# Patient Record
Sex: Female | Born: 1975
Health system: Southern US, Community
[De-identification: ages and names within clinical notes are randomized; demographics above are authoritative.]

## PROBLEM LIST (undated history)

## (undated) ENCOUNTER — Emergency Department (HOSPITAL_COMMUNITY): Admission: EM | Payer: Self-pay | Source: Home / Self Care

## (undated) DIAGNOSIS — E785 Hyperlipidemia, unspecified: Secondary | ICD-10-CM

## (undated) DIAGNOSIS — E11319 Type 2 diabetes mellitus with unspecified diabetic retinopathy without macular edema: Secondary | ICD-10-CM

## (undated) DIAGNOSIS — I739 Peripheral vascular disease, unspecified: Secondary | ICD-10-CM

## (undated) DIAGNOSIS — E109 Type 1 diabetes mellitus without complications: Secondary | ICD-10-CM

## (undated) DIAGNOSIS — D649 Anemia, unspecified: Secondary | ICD-10-CM

## (undated) DIAGNOSIS — Z992 Dependence on renal dialysis: Secondary | ICD-10-CM

## (undated) DIAGNOSIS — M1990 Unspecified osteoarthritis, unspecified site: Secondary | ICD-10-CM

## (undated) DIAGNOSIS — Z9289 Personal history of other medical treatment: Secondary | ICD-10-CM

## (undated) DIAGNOSIS — K219 Gastro-esophageal reflux disease without esophagitis: Secondary | ICD-10-CM

## (undated) DIAGNOSIS — J189 Pneumonia, unspecified organism: Secondary | ICD-10-CM

## (undated) DIAGNOSIS — I509 Heart failure, unspecified: Secondary | ICD-10-CM

## (undated) DIAGNOSIS — E049 Nontoxic goiter, unspecified: Secondary | ICD-10-CM

## (undated) DIAGNOSIS — I639 Cerebral infarction, unspecified: Secondary | ICD-10-CM

## (undated) DIAGNOSIS — I10 Essential (primary) hypertension: Secondary | ICD-10-CM

## (undated) DIAGNOSIS — N186 End stage renal disease: Secondary | ICD-10-CM

## (undated) HISTORY — DX: Cerebral infarction, unspecified: I63.9

## (undated) HISTORY — PX: EYE SURGERY: SHX253

## (undated) HISTORY — PX: ARTERIOVENOUS GRAFT PLACEMENT: SUR1029

## (undated) HISTORY — DX: Essential (primary) hypertension: I10

## (undated) HISTORY — DX: Anemia, unspecified: D64.9

## (undated) HISTORY — DX: Hyperlipidemia, unspecified: E78.5

---

## 1998-08-10 ENCOUNTER — Emergency Department (HOSPITAL_COMMUNITY): Admission: EM | Admit: 1998-08-10 | Discharge: 1998-08-10 | Payer: Self-pay | Admitting: Emergency Medicine

## 1999-08-22 ENCOUNTER — Encounter: Admission: RE | Admit: 1999-08-22 | Discharge: 1999-11-20 | Payer: Self-pay | Admitting: Internal Medicine

## 1999-10-12 ENCOUNTER — Emergency Department (HOSPITAL_COMMUNITY): Admission: EM | Admit: 1999-10-12 | Discharge: 1999-10-12 | Payer: Self-pay | Admitting: Emergency Medicine

## 2003-10-30 ENCOUNTER — Encounter (INDEPENDENT_AMBULATORY_CARE_PROVIDER_SITE_OTHER): Payer: Self-pay | Admitting: *Deleted

## 2003-10-30 ENCOUNTER — Ambulatory Visit (HOSPITAL_COMMUNITY): Admission: RE | Admit: 2003-10-30 | Discharge: 2003-10-30 | Payer: Self-pay | Admitting: Internal Medicine

## 2003-10-31 ENCOUNTER — Encounter: Admission: RE | Admit: 2003-10-31 | Discharge: 2003-10-31 | Payer: Self-pay | Admitting: Internal Medicine

## 2005-01-22 ENCOUNTER — Observation Stay (HOSPITAL_COMMUNITY): Admission: EM | Admit: 2005-01-22 | Discharge: 2005-01-23 | Payer: Self-pay | Admitting: Emergency Medicine

## 2005-03-15 ENCOUNTER — Emergency Department (HOSPITAL_COMMUNITY): Admission: EM | Admit: 2005-03-15 | Discharge: 2005-03-15 | Payer: Self-pay | Admitting: Emergency Medicine

## 2005-04-04 ENCOUNTER — Emergency Department (HOSPITAL_COMMUNITY): Admission: EM | Admit: 2005-04-04 | Discharge: 2005-04-04 | Payer: Self-pay | Admitting: Emergency Medicine

## 2005-05-14 ENCOUNTER — Encounter: Admission: RE | Admit: 2005-05-14 | Discharge: 2005-05-14 | Payer: Self-pay | Admitting: Internal Medicine

## 2005-06-18 ENCOUNTER — Emergency Department (HOSPITAL_COMMUNITY): Admission: EM | Admit: 2005-06-18 | Discharge: 2005-06-18 | Payer: Self-pay | Admitting: Emergency Medicine

## 2005-07-08 ENCOUNTER — Inpatient Hospital Stay (HOSPITAL_COMMUNITY): Admission: AD | Admit: 2005-07-08 | Discharge: 2005-07-08 | Payer: Self-pay | Admitting: Obstetrics & Gynecology

## 2005-07-19 ENCOUNTER — Inpatient Hospital Stay (HOSPITAL_COMMUNITY): Admission: AD | Admit: 2005-07-19 | Discharge: 2005-07-19 | Payer: Self-pay | Admitting: Obstetrics and Gynecology

## 2005-07-21 ENCOUNTER — Observation Stay (HOSPITAL_COMMUNITY): Admission: AD | Admit: 2005-07-21 | Discharge: 2005-07-22 | Payer: Self-pay | Admitting: Obstetrics and Gynecology

## 2005-07-31 ENCOUNTER — Inpatient Hospital Stay (HOSPITAL_COMMUNITY): Admission: AD | Admit: 2005-07-31 | Discharge: 2005-07-31 | Payer: Self-pay | Admitting: *Deleted

## 2005-08-01 ENCOUNTER — Inpatient Hospital Stay (HOSPITAL_COMMUNITY): Admission: AD | Admit: 2005-08-01 | Discharge: 2005-08-01 | Payer: Self-pay | Admitting: Obstetrics and Gynecology

## 2005-09-12 ENCOUNTER — Emergency Department (HOSPITAL_COMMUNITY): Admission: EM | Admit: 2005-09-12 | Discharge: 2005-09-12 | Payer: Self-pay | Admitting: Emergency Medicine

## 2005-09-30 ENCOUNTER — Encounter (HOSPITAL_COMMUNITY): Admission: RE | Admit: 2005-09-30 | Discharge: 2005-11-12 | Payer: Self-pay | Admitting: Nephrology

## 2005-10-11 ENCOUNTER — Inpatient Hospital Stay (HOSPITAL_COMMUNITY): Admission: EM | Admit: 2005-10-11 | Discharge: 2005-10-13 | Payer: Self-pay | Admitting: Emergency Medicine

## 2005-10-16 ENCOUNTER — Ambulatory Visit: Payer: Self-pay | Admitting: Gastroenterology

## 2005-10-27 ENCOUNTER — Encounter: Admission: RE | Admit: 2005-10-27 | Discharge: 2005-10-27 | Payer: Self-pay | Admitting: Nephrology

## 2005-11-14 ENCOUNTER — Inpatient Hospital Stay (HOSPITAL_COMMUNITY): Admission: EM | Admit: 2005-11-14 | Discharge: 2005-11-17 | Payer: Self-pay | Admitting: Emergency Medicine

## 2005-12-21 ENCOUNTER — Inpatient Hospital Stay (HOSPITAL_COMMUNITY): Admission: EM | Admit: 2005-12-21 | Discharge: 2005-12-31 | Payer: Self-pay | Admitting: Internal Medicine

## 2005-12-23 ENCOUNTER — Encounter (INDEPENDENT_AMBULATORY_CARE_PROVIDER_SITE_OTHER): Payer: Self-pay | Admitting: Specialist

## 2006-02-20 ENCOUNTER — Ambulatory Visit (HOSPITAL_COMMUNITY): Admission: RE | Admit: 2006-02-20 | Discharge: 2006-02-20 | Payer: Self-pay | Admitting: Urology

## 2006-04-06 ENCOUNTER — Emergency Department (HOSPITAL_COMMUNITY): Admission: EM | Admit: 2006-04-06 | Discharge: 2006-04-06 | Payer: Self-pay | Admitting: Emergency Medicine

## 2006-04-07 ENCOUNTER — Emergency Department (HOSPITAL_COMMUNITY): Admission: EM | Admit: 2006-04-07 | Discharge: 2006-04-07 | Payer: Self-pay | Admitting: Emergency Medicine

## 2006-05-08 ENCOUNTER — Encounter (HOSPITAL_COMMUNITY): Admission: RE | Admit: 2006-05-08 | Discharge: 2006-05-08 | Payer: Self-pay | Admitting: Nephrology

## 2006-08-17 ENCOUNTER — Emergency Department (HOSPITAL_COMMUNITY): Admission: EM | Admit: 2006-08-17 | Discharge: 2006-08-17 | Payer: Self-pay | Admitting: Emergency Medicine

## 2006-09-06 ENCOUNTER — Inpatient Hospital Stay (HOSPITAL_COMMUNITY): Admission: EM | Admit: 2006-09-06 | Discharge: 2006-09-07 | Payer: Self-pay | Admitting: Emergency Medicine

## 2006-09-09 ENCOUNTER — Ambulatory Visit (HOSPITAL_COMMUNITY): Admission: RE | Admit: 2006-09-09 | Discharge: 2006-09-09 | Payer: Self-pay | Admitting: Vascular Surgery

## 2006-09-09 ENCOUNTER — Ambulatory Visit: Payer: Self-pay | Admitting: Vascular Surgery

## 2006-10-05 ENCOUNTER — Ambulatory Visit: Payer: Self-pay | Admitting: Vascular Surgery

## 2006-11-16 ENCOUNTER — Encounter (HOSPITAL_COMMUNITY): Admission: RE | Admit: 2006-11-16 | Discharge: 2007-02-14 | Payer: Self-pay | Admitting: Nephrology

## 2007-02-15 ENCOUNTER — Encounter (HOSPITAL_COMMUNITY): Admission: RE | Admit: 2007-02-15 | Discharge: 2007-04-26 | Payer: Self-pay | Admitting: Nephrology

## 2007-05-31 ENCOUNTER — Encounter (HOSPITAL_COMMUNITY): Admission: RE | Admit: 2007-05-31 | Discharge: 2007-07-23 | Payer: Self-pay | Admitting: Nephrology

## 2007-08-31 ENCOUNTER — Encounter (HOSPITAL_COMMUNITY): Admission: RE | Admit: 2007-08-31 | Discharge: 2007-11-29 | Payer: Self-pay | Admitting: Nephrology

## 2008-02-15 ENCOUNTER — Encounter (HOSPITAL_COMMUNITY): Admission: RE | Admit: 2008-02-15 | Discharge: 2008-05-15 | Payer: Self-pay | Admitting: Nephrology

## 2008-06-02 ENCOUNTER — Inpatient Hospital Stay (HOSPITAL_COMMUNITY): Admission: EM | Admit: 2008-06-02 | Discharge: 2008-06-04 | Payer: Self-pay | Admitting: Emergency Medicine

## 2008-10-06 ENCOUNTER — Other Ambulatory Visit: Payer: Self-pay | Admitting: Emergency Medicine

## 2008-10-07 ENCOUNTER — Inpatient Hospital Stay (HOSPITAL_COMMUNITY): Admission: RE | Admit: 2008-10-07 | Discharge: 2008-10-09 | Payer: Self-pay | Admitting: Internal Medicine

## 2008-10-07 ENCOUNTER — Other Ambulatory Visit: Payer: Self-pay | Admitting: Emergency Medicine

## 2008-10-16 ENCOUNTER — Encounter (HOSPITAL_COMMUNITY): Admission: RE | Admit: 2008-10-16 | Discharge: 2009-01-14 | Payer: Self-pay | Admitting: Nephrology

## 2008-11-08 ENCOUNTER — Ambulatory Visit: Payer: Self-pay | Admitting: Internal Medicine

## 2009-01-23 ENCOUNTER — Encounter (HOSPITAL_COMMUNITY): Admission: RE | Admit: 2009-01-23 | Discharge: 2009-04-23 | Payer: Self-pay | Admitting: Nephrology

## 2009-05-14 ENCOUNTER — Encounter (HOSPITAL_COMMUNITY): Admission: RE | Admit: 2009-05-14 | Discharge: 2009-08-12 | Payer: Self-pay | Admitting: Nephrology

## 2009-05-14 ENCOUNTER — Ambulatory Visit (HOSPITAL_COMMUNITY): Admission: RE | Admit: 2009-05-14 | Discharge: 2009-05-14 | Payer: Self-pay | Admitting: Internal Medicine

## 2009-08-20 ENCOUNTER — Encounter (HOSPITAL_COMMUNITY): Admission: RE | Admit: 2009-08-20 | Discharge: 2009-11-18 | Payer: Self-pay | Admitting: Nephrology

## 2009-11-19 ENCOUNTER — Encounter (HOSPITAL_COMMUNITY): Admission: RE | Admit: 2009-11-19 | Discharge: 2010-02-17 | Payer: Self-pay | Admitting: Nephrology

## 2010-03-29 ENCOUNTER — Encounter (HOSPITAL_COMMUNITY)
Admission: RE | Admit: 2010-03-29 | Discharge: 2010-06-27 | Payer: Self-pay | Source: Home / Self Care | Attending: Nephrology | Admitting: Nephrology

## 2010-07-23 ENCOUNTER — Encounter (HOSPITAL_COMMUNITY)
Admission: RE | Admit: 2010-07-23 | Discharge: 2010-08-20 | Payer: Self-pay | Source: Home / Self Care | Attending: Nephrology | Admitting: Nephrology

## 2010-08-11 ENCOUNTER — Encounter: Payer: Self-pay | Admitting: Internal Medicine

## 2010-08-11 ENCOUNTER — Encounter: Payer: Self-pay | Admitting: Urology

## 2010-09-17 ENCOUNTER — Encounter (HOSPITAL_COMMUNITY): Payer: Medicaid Other | Attending: Nephrology

## 2010-09-24 ENCOUNTER — Encounter (HOSPITAL_COMMUNITY): Payer: Medicaid Other | Attending: Nephrology

## 2010-09-24 DIAGNOSIS — E119 Type 2 diabetes mellitus without complications: Secondary | ICD-10-CM | POA: Insufficient documentation

## 2010-09-24 DIAGNOSIS — N2581 Secondary hyperparathyroidism of renal origin: Secondary | ICD-10-CM | POA: Insufficient documentation

## 2010-09-24 DIAGNOSIS — N184 Chronic kidney disease, stage 4 (severe): Secondary | ICD-10-CM | POA: Insufficient documentation

## 2010-09-24 DIAGNOSIS — D638 Anemia in other chronic diseases classified elsewhere: Secondary | ICD-10-CM | POA: Insufficient documentation

## 2010-10-01 LAB — RENAL FUNCTION PANEL
Albumin: 3.9 g/dL (ref 3.5–5.2)
BUN: 40 mg/dL — ABNORMAL HIGH (ref 6–23)
CO2: 23 mEq/L (ref 19–32)
Calcium: 9.6 mg/dL (ref 8.4–10.5)
Chloride: 103 mEq/L (ref 96–112)
Creatinine, Ser: 5.22 mg/dL — ABNORMAL HIGH (ref 0.4–1.2)
GFR calc Af Amer: 12 mL/min — ABNORMAL LOW (ref >60)
GFR calc non Af Amer: 10 mL/min — ABNORMAL LOW (ref >60)
Glucose, Bld: 121 mg/dL — ABNORMAL HIGH (ref 70–99)
Phosphorus: 3.8 mg/dL (ref 2.3–4.6)
Potassium: 4 mEq/L (ref 3.5–5.1)
Sodium: 140 mEq/L (ref 135–145)

## 2010-10-01 LAB — POCT HEMOGLOBIN-HEMACUE: Hemoglobin: 12.5 g/dL (ref 12.0–15.0)

## 2010-10-01 LAB — FERRITIN: Ferritin: 1290 ng/mL — ABNORMAL HIGH (ref 10–291)

## 2010-10-01 LAB — IRON AND TIBC
Iron: 98 ug/dL (ref 42–135)
Saturation Ratios: 39 % (ref 20–55)
TIBC: 253 ug/dL (ref 250–470)
UIBC: 155 ug/dL

## 2010-10-02 LAB — IRON AND TIBC
Iron: 58 ug/dL (ref 42–135)
Saturation Ratios: 23 % (ref 20–55)
TIBC: 255 ug/dL (ref 250–470)
UIBC: 197 ug/dL

## 2010-10-02 LAB — RENAL FUNCTION PANEL
Albumin: 3.8 g/dL (ref 3.5–5.2)
BUN: 37 mg/dL — ABNORMAL HIGH (ref 6–23)
CO2: 25 mEq/L (ref 19–32)
Calcium: 9.3 mg/dL (ref 8.4–10.5)
Chloride: 106 mEq/L (ref 96–112)
Creatinine, Ser: 4.96 mg/dL — ABNORMAL HIGH (ref 0.4–1.2)
GFR calc Af Amer: 12 mL/min — ABNORMAL LOW (ref >60)
GFR calc non Af Amer: 10 mL/min — ABNORMAL LOW (ref >60)
Glucose, Bld: 99 mg/dL (ref 70–99)
Phosphorus: 3.6 mg/dL (ref 2.3–4.6)
Potassium: 3.7 mEq/L (ref 3.5–5.1)
Sodium: 140 mEq/L (ref 135–145)

## 2010-10-02 LAB — FERRITIN: Ferritin: 296 ng/mL — ABNORMAL HIGH (ref 10–291)

## 2010-10-02 LAB — POCT HEMOGLOBIN-HEMACUE: Hemoglobin: 12.6 g/dL (ref 12.0–15.0)

## 2010-10-03 LAB — IRON AND TIBC
Iron: 56 ug/dL (ref 42–135)
Saturation Ratios: 24 % (ref 20–55)
TIBC: 233 ug/dL — ABNORMAL LOW (ref 250–470)
UIBC: 177 ug/dL

## 2010-10-03 LAB — RENAL FUNCTION PANEL
Albumin: 3.8 g/dL (ref 3.5–5.2)
Calcium: 8.6 mg/dL (ref 8.4–10.5)
Creatinine, Ser: 4.71 mg/dL — ABNORMAL HIGH (ref 0.4–1.2)
GFR calc Af Amer: 13 mL/min — ABNORMAL LOW (ref >60)
GFR calc non Af Amer: 11 mL/min — ABNORMAL LOW (ref >60)
Phosphorus: 3.2 mg/dL (ref 2.3–4.6)
Sodium: 129 mEq/L — ABNORMAL LOW (ref 135–145)

## 2010-10-03 LAB — FERRITIN: Ferritin: 422 ng/mL — ABNORMAL HIGH (ref 10–291)

## 2010-10-03 LAB — POCT HEMOGLOBIN-HEMACUE
Hemoglobin: 10.9 g/dL — ABNORMAL LOW (ref 12.0–15.0)
Hemoglobin: 7.5 g/dL — ABNORMAL LOW (ref 12.0–15.0)
Hemoglobin: 7.9 g/dL — ABNORMAL LOW (ref 12.0–15.0)

## 2010-10-06 LAB — CBC
HCT: 41.1 % (ref 36.0–46.0)
Hemoglobin: 14.1 g/dL (ref 12.0–15.0)
MCHC: 34.3 g/dL (ref 30.0–36.0)
RBC: 4.73 MIL/uL (ref 3.87–5.11)
RDW: 16.1 % — ABNORMAL HIGH (ref 11.5–15.5)

## 2010-10-06 LAB — POCT HEMOGLOBIN-HEMACUE: Hemoglobin: 11.3 g/dL — ABNORMAL LOW (ref 12.0–15.0)

## 2010-10-06 LAB — IRON AND TIBC: TIBC: 251 ug/dL (ref 250–470)

## 2010-10-06 LAB — FERRITIN: Ferritin: 356 ng/mL — ABNORMAL HIGH (ref 10–291)

## 2010-10-07 LAB — RENAL FUNCTION PANEL
Albumin: 4.8 g/dL (ref 3.5–5.2)
CO2: 22 mEq/L (ref 19–32)
Chloride: 106 mEq/L (ref 96–112)
Creatinine, Ser: 5.38 mg/dL — ABNORMAL HIGH (ref 0.4–1.2)
GFR calc Af Amer: 11 mL/min — ABNORMAL LOW (ref >60)
GFR calc non Af Amer: 9 mL/min — ABNORMAL LOW (ref >60)
Potassium: 5.1 mEq/L (ref 3.5–5.1)

## 2010-10-07 LAB — POCT HEMOGLOBIN-HEMACUE: Hemoglobin: 12.3 g/dL (ref 12.0–15.0)

## 2010-10-07 LAB — IRON AND TIBC
Iron: 79 ug/dL (ref 42–135)
Saturation Ratios: 26 % (ref 20–55)
TIBC: 302 ug/dL (ref 250–470)
UIBC: 223 ug/dL

## 2010-10-07 LAB — PTH, INTACT AND CALCIUM: Calcium, Total (PTH): 10.2 mg/dL (ref 8.4–10.5)

## 2010-10-08 LAB — POCT HEMOGLOBIN-HEMACUE: Hemoglobin: 9.7 g/dL — ABNORMAL LOW (ref 12.0–15.0)

## 2010-10-09 LAB — IRON AND TIBC
Iron: 73 ug/dL (ref 42–135)
Saturation Ratios: 34 % (ref 20–55)
TIBC: 254 ug/dL (ref 250–470)
TIBC: 238 ug/dL — ABNORMAL LOW (ref 250–470)
UIBC: 157 ug/dL

## 2010-10-09 LAB — POCT HEMOGLOBIN-HEMACUE: Hemoglobin: 10.8 g/dL — ABNORMAL LOW (ref 12.0–15.0)

## 2010-10-09 LAB — FERRITIN: Ferritin: 467 ng/mL — ABNORMAL HIGH (ref 10–291)

## 2010-10-13 LAB — POCT HEMOGLOBIN-HEMACUE: Hemoglobin: 9.9 g/dL — ABNORMAL LOW (ref 12.0–15.0)

## 2010-10-21 ENCOUNTER — Other Ambulatory Visit: Payer: Self-pay | Admitting: Nephrology

## 2010-10-21 ENCOUNTER — Ambulatory Visit
Admission: RE | Admit: 2010-10-21 | Discharge: 2010-10-21 | Disposition: A | Discharge status: Home / Self Care | Payer: Medicaid Other | Source: Ambulatory Visit | Attending: Nephrology | Admitting: Nephrology

## 2010-10-21 DIAGNOSIS — R05 Cough: Secondary | ICD-10-CM

## 2010-10-21 DIAGNOSIS — R059 Cough, unspecified: Secondary | ICD-10-CM

## 2010-10-21 LAB — IRON AND TIBC
Iron: <10 ug/dL — ABNORMAL LOW (ref 42–135)
UIBC: UNDETERMINED ug/dL

## 2010-10-21 LAB — FERRITIN: Ferritin: 297 ng/mL — ABNORMAL HIGH (ref 10–291)

## 2010-10-22 LAB — POCT HEMOGLOBIN-HEMACUE: Hemoglobin: 11.7 g/dL — ABNORMAL LOW (ref 12.0–15.0)

## 2010-10-23 ENCOUNTER — Encounter (HOSPITAL_COMMUNITY): Payer: Medicaid Other

## 2010-10-23 LAB — POCT HEMOGLOBIN-HEMACUE
Hemoglobin: 12.2 g/dL (ref 12.0–15.0)
Hemoglobin: 9.3 g/dL — ABNORMAL LOW (ref 12.0–15.0)

## 2010-10-24 LAB — IRON AND TIBC
Iron: 82 ug/dL (ref 42–135)
Saturation Ratios: 31 % (ref 20–55)
TIBC: 268 ug/dL (ref 250–470)
UIBC: 186 ug/dL

## 2010-10-26 LAB — IRON AND TIBC
Saturation Ratios: 30 % (ref 20–55)
TIBC: 229 ug/dL — ABNORMAL LOW (ref 250–470)
UIBC: 160 ug/dL

## 2010-10-26 LAB — RENAL FUNCTION PANEL
CO2: 19 mEq/L (ref 19–32)
Calcium: 9.1 mg/dL (ref 8.4–10.5)
Creatinine, Ser: 4.16 mg/dL — ABNORMAL HIGH (ref 0.4–1.2)
GFR calc Af Amer: 15 mL/min — ABNORMAL LOW (ref >60)
GFR calc non Af Amer: 13 mL/min — ABNORMAL LOW (ref >60)
Phosphorus: 4 mg/dL (ref 2.3–4.6)
Sodium: 138 mEq/L (ref 135–145)

## 2010-10-27 LAB — POCT HEMOGLOBIN-HEMACUE: Hemoglobin: 12.4 g/dL (ref 12.0–15.0)

## 2010-10-28 LAB — POCT HEMOGLOBIN-HEMACUE: Hemoglobin: 12.1 g/dL (ref 12.0–15.0)

## 2010-10-29 LAB — FERRITIN: Ferritin: 200 ng/mL (ref 10–291)

## 2010-10-29 LAB — RENAL FUNCTION PANEL
BUN: 34 mg/dL — ABNORMAL HIGH (ref 6–23)
CO2: 21 mEq/L (ref 19–32)
Calcium: 8.9 mg/dL (ref 8.4–10.5)
Chloride: 116 mEq/L — ABNORMAL HIGH (ref 96–112)
Creatinine, Ser: 3.93 mg/dL — ABNORMAL HIGH (ref 0.4–1.2)
GFR calc Af Amer: 16 mL/min — ABNORMAL LOW (ref >60)
GFR calc non Af Amer: 13 mL/min — ABNORMAL LOW (ref >60)

## 2010-10-29 LAB — IRON AND TIBC
Iron: 55 ug/dL (ref 42–135)
Saturation Ratios: 24 % (ref 20–55)
TIBC: 231 ug/dL — ABNORMAL LOW (ref 250–470)

## 2010-10-29 LAB — GLUCOSE, CAPILLARY: Glucose-Capillary: 139 mg/dL — ABNORMAL HIGH (ref 70–99)

## 2010-10-30 LAB — POCT HEMOGLOBIN-HEMACUE
Hemoglobin: 9.7 g/dL — ABNORMAL LOW (ref 12.0–15.0)
Hemoglobin: 10.3 g/dL — ABNORMAL LOW (ref 12.0–15.0)

## 2010-10-31 LAB — CBC
Hemoglobin: 9.9 g/dL — ABNORMAL LOW (ref 12.0–15.0)
MCHC: 35.3 g/dL (ref 30.0–36.0)
MCHC: 35.3 g/dL (ref 30.0–36.0)
Platelets: 213 10*3/uL (ref 150–400)
RBC: 3.2 MIL/uL — ABNORMAL LOW (ref 3.87–5.11)
RDW: 13.1 % (ref 11.5–15.5)

## 2010-10-31 LAB — BASIC METABOLIC PANEL
BUN: 47 mg/dL — ABNORMAL HIGH (ref 6–23)
CO2: 15 mEq/L — ABNORMAL LOW (ref 19–32)
CO2: 18 mEq/L — ABNORMAL LOW (ref 19–32)
CO2: 18 mEq/L — ABNORMAL LOW (ref 19–32)
Calcium: 8.3 mg/dL — ABNORMAL LOW (ref 8.4–10.5)
Calcium: 8.3 mg/dL — ABNORMAL LOW (ref 8.4–10.5)
Calcium: 8.4 mg/dL (ref 8.4–10.5)
Calcium: 8.5 mg/dL (ref 8.4–10.5)
Chloride: 110 mEq/L (ref 96–112)
Chloride: 117 mEq/L — ABNORMAL HIGH (ref 96–112)
Creatinine, Ser: 3.9 mg/dL — ABNORMAL HIGH (ref 0.4–1.2)
Creatinine, Ser: 3.92 mg/dL — ABNORMAL HIGH (ref 0.4–1.2)
GFR calc Af Amer: 16 mL/min — ABNORMAL LOW (ref >60)
GFR calc Af Amer: 16 mL/min — ABNORMAL LOW (ref >60)
GFR calc Af Amer: 20 mL/min — ABNORMAL LOW (ref >60)
GFR calc non Af Amer: 13 mL/min — ABNORMAL LOW (ref >60)
GFR calc non Af Amer: 13 mL/min — ABNORMAL LOW (ref >60)
Glucose, Bld: 182 mg/dL — ABNORMAL HIGH (ref 70–99)
Glucose, Bld: 432 mg/dL — ABNORMAL HIGH (ref 70–99)
Glucose, Bld: 70 mg/dL (ref 70–99)
Potassium: 4.3 mEq/L (ref 3.5–5.1)
Sodium: 137 mEq/L (ref 135–145)
Sodium: 138 mEq/L (ref 135–145)
Sodium: 141 mEq/L (ref 135–145)

## 2010-10-31 LAB — URINALYSIS, ROUTINE W REFLEX MICROSCOPIC
Bilirubin Urine: NEGATIVE
Glucose, UA: >1000 mg/dL — AB
Ketones, ur: 40 mg/dL — AB
Leukocytes, UA: NEGATIVE
Nitrite: NEGATIVE
Protein, ur: 100 mg/dL — AB
Specific Gravity, Urine: 1.019 (ref 1.005–1.030)
Urobilinogen, UA: 0.2 mg/dL (ref 0.0–1.0)
pH: 5.5 (ref 5.0–8.0)

## 2010-10-31 LAB — BLOOD GAS, VENOUS
Acid-base deficit: 16.8 mmol/L — ABNORMAL HIGH (ref 0.0–2.0)
Bicarbonate: 10.4 mEq/L — ABNORMAL LOW (ref 20.0–24.0)
Patient temperature: 98.6
TCO2: 9.3 mmol/L (ref 0–100)

## 2010-10-31 LAB — GLUCOSE, CAPILLARY
Glucose-Capillary: 113 mg/dL — ABNORMAL HIGH (ref 70–99)
Glucose-Capillary: 119 mg/dL — ABNORMAL HIGH (ref 70–99)
Glucose-Capillary: 144 mg/dL — ABNORMAL HIGH (ref 70–99)
Glucose-Capillary: 144 mg/dL — ABNORMAL HIGH (ref 70–99)
Glucose-Capillary: 167 mg/dL — ABNORMAL HIGH (ref 70–99)
Glucose-Capillary: 270 mg/dL — ABNORMAL HIGH (ref 70–99)
Glucose-Capillary: 305 mg/dL — ABNORMAL HIGH (ref 70–99)
Glucose-Capillary: 413 mg/dL — ABNORMAL HIGH (ref 70–99)

## 2010-10-31 LAB — MAGNESIUM: Magnesium: 2.2 mg/dL (ref 1.5–2.5)

## 2010-10-31 LAB — POCT I-STAT, CHEM 8
Calcium, Ion: 1.14 mmol/L (ref 1.12–1.32)
Creatinine, Ser: 5.5 mg/dL — ABNORMAL HIGH (ref 0.4–1.2)
Glucose, Bld: >700 mg/dL (ref 70–99)
HCT: 33 % — ABNORMAL LOW (ref 36.0–46.0)
Hemoglobin: 11.2 g/dL — ABNORMAL LOW (ref 12.0–15.0)

## 2010-10-31 LAB — POCT HEMOGLOBIN-HEMACUE: Hemoglobin: 9.8 g/dL — ABNORMAL LOW (ref 12.0–15.0)

## 2010-10-31 LAB — LIPID PANEL
HDL: 40 mg/dL (ref >39)
LDL Cholesterol: 66 mg/dL (ref 0–99)
Total CHOL/HDL Ratio: 3.5 RATIO
Triglycerides: 170 mg/dL — ABNORMAL HIGH (ref <150)
VLDL: 34 mg/dL (ref 0–40)

## 2010-10-31 LAB — URINE MICROSCOPIC-ADD ON

## 2010-10-31 LAB — KETONES, QUALITATIVE

## 2010-12-03 NOTE — Discharge Summary (Signed)
[name], [name] NO.:  [id number]   MEDICAL RECORD NO.:  [id number]          PATIENT TYPE:  INP   LOCATION:  [id number]                         FACILITY:  [facility]   PHYSICIAN:  Domingo Mend, M.D. DATE OF BIRTH:  08/07/1975   DATE OF ADMISSION:  10/07/2008  DATE OF DISCHARGE:  10/09/2008                               DISCHARGE SUMMARY   DISCHARGE DIAGNOSES:  1. Diabetic ketoacidosis, resolved.  2. Type 1 diabetes mellitus.  3. Acute on chronic renal insufficiency with a baseline creatinine of      around 3.5.  4. Hypertension.  5. Hyperlipidemia.   DISCHARGE MEDICATIONS:  1. Norvasc 10 mg daily.  2. Zocor 20 mg daily.  3. Insulin 70/30 15 units in the morning and 10 units in the evening.   DISPOSITION AND FOLLOWUP:  [name] is discharged home in stable  condition.  She has been started on a regimen of 70/30 which would be  more financially adequate for her.  She was unable to afford the Lantus  in the past.  She has an appointment to see her nephrologist, Dr.  Marval Regal on Thursday, October 12, 2008, at 9 a.m.  She is urged to keep  that appointment as she may be nearing dialysis.  I have also asked case  management to assist [place] with providing her with primary care physicians  in the area who are accepting new patients.   CONSULTATION DURING THIS HOSPITALIZATION:  None.   IMAGES AND PROCEDURES:  None.   HISTORY AND PHYSICAL EXAM:  For full details please, refer to dictation  by Dr. Arnoldo Morale on October 07, 2008, but in brief [name] is a very  pleasant 35 year old African American woman with a history of type 2  diabetes mellitus who complains of being sick for the past 4 days' of  nausea, vomiting, increased weakness, increased thirst, and increased  urination.  She states that she ran out of her diabetic medications  about 4 days ago.  She also states that she has not had money to pay for  her medications either.  In emergency department, she was  found to have  a blood sugar of greater than 700 and for that reason she was admitted  for further evaluation and management.   HOSPITAL COURSE:  1. Diabetic ketoacidosis.  Concern was elevated CBG and ketones and      blood in urine.  She was initially admitted to the ICU and placed      on the DKA protocol including IV insulin monitor by the      Glucommander with copious amounts of IV fluids and potassium.  Her      DKA has now resolved.  She has been transitioned over to 70/30      insulin 15 units in the morning and 10 at night.  Her bicarb upon      discharge is 46 and she has a gap of 6 which is now closed.  She      will need close followup in the outpatient setting to ensure she      does not return  to the hospital in diabetic ketoacidosis.  2. For her type 1 diabetes mellitus, see above.  3. Chronic renal insufficiency.  She has a baseline creatinine of      about 3.5.  Upon admission, her creatinine was 5.5 which was likely      secondary to dehydration given the massive dehydration with DKA.      Her creatinine has now decreased to her baseline of about 3.5 and      in fact that it was 3.35 upon discharge today.  She has an      appointment this week with Dr. Marval Regal and she is urged to keep      that appointment as scheduled.  4. For her hypertension, she has maintained excellent blood pressure      control while in the hospital.  5. For her hyperlipidemia, she has an excellent fasting lipid panel as      follows:  Total cholesterol 140, triglycerides 170, and HDL 40 with      an LDL of 66.  She has been maintained on statin.  6. No other chronic medical issues.   DISCHARGE VITAL SIGNS:  Blood pressure 122/78, heart rate 86,  respirations 18, and O2 sats 99% on room air with a temperature of 98.0.   DISCHARGE LABORATORY DATA:  Sodium 141, potassium 3.7, chloride 117,  bicarb 18, BUN 27, and creatinine 3.35 with a glucose of 105 that gives  her a gap of 6.  WBCs 5.1,  hemoglobin 9.9, and a platelet count of 187.   LABORATORY DATA AND IMAGES PENDING AT TIME OF THIS DICTATION:  None.      Domingo Mend, M.D.  Electronically Signed     EH/MEDQ  D:  10/09/2008  T:  10/09/2008  Job:  [id number]   cc:   Donato Heinz, M.D.

## 2010-12-03 NOTE — H&P (Signed)
[name], FAGUNDES NO.:  [id number]   MEDICAL RECORD NO.:  [id number]          PATIENT TYPE:  EMS   LOCATION:  ED                           [facility]ACILITY[facility]   PHYSICIAN:  Jana Hakim, M.D. DATE OF BIRTH:  1976-02-04   DATE OF ADMISSION:  10/06/2008  DATE OF DISCHARGE:                              HISTORY & PHYSICAL   PRIMARY CARE PHYSICIAN:  [facility].   RENAL SPECIALIST:  Dr. Marval Regal.   CHIEF COMPLAINT:  Weakness, nausea.   HISTORY OF PRESENT ILLNESS:  This is a 35 year old female who presents  to the emergency department with complaints of being sick for the past 4  days with nausea, no vomiting, but increased weakness, increased thirst,  increased drinking of fluids and increased urination.  She states that  she ran out of her diabetic medications 4 days ago and became ill at  that time.  She states that she has not had money to pay for her  medications and receive them.   The patient was evaluated in the emergency department and found to have  a blood sugar greater than 700.  She also had multiple electrolyte  abnormalities and was referred for admission and placed on the IV  insulin drip protocol.   PAST MEDICAL HISTORY:  1. Type 1 diabetes mellitus.  2. History of chronic renal failure and has been evaluated for      possible upcoming dialysis.   PAST SURGICAL HISTORY:  1. History of a C-section.  2. History of an AV fistula in the left upper extremity.   MEDICATIONS:  None at time.   ALLERGIES:  NO KNOWN DRUG ALLERGIES.   SOCIAL HISTORY:  The patient is a nonsmoker, nondrinker.   FAMILY HISTORY:  Unknown.   PHYSICAL EXAMINATION:  GENERAL:  This is a 35 year old well-nourished,  well-developed female in acute distress.  VITAL SIGNS:  Temperature 98.8, blood pressure 176/85, heart rate 111,  respirations 20.  O2 saturations 100% on room air.  HEENT:  Normocephalic, atraumatic.  Pupils equally round and reactive to  light.   Extraocular movements are intact.  Funduscopic benign.  Oropharynx is clear.  NECK:  Supple.  Full range of motion.  No thyromegaly, adenopathy or  jugulovenous distention.  CARDIOVASCULAR:  Tachycardiac rate and rhythm.  No murmurs, gallops or rubs.  LUNGS:  Clear to auscultation bilaterally.  ABDOMEN:  Positive bowel sounds.  Soft, nontender, nondistended.  EXTREMITIES:  Without cyanosis, clubbing or edema.  NEUROLOGIC:  Alert and oriented x3.  There are no focal deficits.   LABORATORY STUDIES:  A venous blood gas was performed with a pH of  7.199, pCO2 of 27.6, pO2 of 30.3 and bicarb 10.4.  O2 saturations 51.9%.  Sodium 122, potassium 6.3, chloride 102, BUN 70, creatinine 5.5, glucose  greater than 700 and CO2 of 10.  Blood acetone rated as being small.   ASSESSMENT:  A 35 year old female being admitted with;  1. Severe acute diabetic ketoacidosis.  2. Acute renal failure with chronic renal failure.  3. Hyponatremia.  4. Hyperkalemia in the setting of diabetic ketoacidosis and chronic  renal failure.   PLAN:  The patient will be admitted to an ICU unit.  She has been placed  on the IV insulin drip protocol and IV fluids have been ordered for  fluid resuscitation.  Her electrolytes will be monitored serially until  her anion gap closes.  The patient will be placed on non-caloric clears  while she is on the IV insulin drip.  She will transition to a sliding  scale protocol once she is off the insulin drip.  Antiemetics have been  ordered as needed and the patient will be placed on DVT and GI  prophylaxis.  The [facility] will be notified of the  patient's admission and will be asked for consultation.  The patient  will be transferred to the [facility] since she is in acute  renal failure with chronic renal failure and may need to be evaluated  for dialysis sooner.      Jana Hakim, M.D.  Electronically Signed     HJ/MEDQ  D:  10/07/2008   T:  10/07/2008  Job:  [id number]

## 2010-12-03 NOTE — H&P (Signed)
[name], [name] NO.:  [id number]   MEDICAL RECORD NO.:  [id number]          PATIENT TYPE:  EMS   LOCATION:  ED                           [facility]ACILITY:  [facility]   PHYSICIAN:  Lurene Shadow, MDDATE OF BIRTH:  05/30/1976   DATE OF ADMISSION:  06/02/2008  DATE OF DISCHARGE:                              HISTORY & PHYSICAL   Patient is unassigned.   RENAL DOCTOR:  Donato Heinz, M.D. at [facility].   ENDOCRINOLOGIST:  Elayne Snare, M.D.   CHIEF COMPLAINT:  Nausea, vomiting, dizziness.   HISTORY OF PRESENT ILLNESS:  [name] is a 35 year old female with a  history of hypertension, diabetes, and hypercholesterolemia who  presented to the emergency room with 2-day history of nausea, vomiting,  and dizziness.  The patient states that she ran out of her medications  approximately a month ago.  She ran out of her insulin this Monday, 5  days ago.  Since that time, she was feeling sick.  She was having  diarrhea, loose, watery, dark-colored stools approximately 5-6 times a  day.  Subsequently, she started having nausea and vomiting since  yesterday, associated with dizziness with activity and abdominal  soreness.  So, this morning, she took her relative's Glucometer and  checked her pressure, and it came as high, so the patient is able to  come to the emergency room.  She denies having any __________ or fevers.  Still feeling dizzy, weak all over.  She denies having any cough or  dysuria.  No chest pain or dyspnea.   REVIEW OF SYSTEMS:  Patient is complaining of having some headache and  dizziness with exertion.  Denies having any dysphagia or dysarthria.  Denies having any sore throat.  No visual or auditory symptoms.  Denies  having any chest pain.  Denies having any dyspnea, no orthopnea,  paroxysmal nocturnal dyspnea.  Has generalized abdominal soreness,  mainly in the epigastric region.  Denies having any dysuria.  Does have  frequent  urination.  Complaining of generalized weakness.  Denies having  any focal neurologic symptoms.   PAST MEDICAL HISTORY:  1. Hypertension.  2. Insulin-dependent diabetes mellitus.  3. Hypercholesterolemia.  4. Chronic kidney disease.   PAST SURGICAL HISTORY:  She had a cesarean section done.   __________ negative.   SOCIAL HISTORY:  Denies smoking, alcohol, or drug use.   FAMILY HISTORY:  Diabetes and hypertension runs in her family.   HOME MEDICATIONS:  1. Lantus 10 units subcutaneously twice daily.  2. NovoLog as per the sliding scale.  3. Norvasc 10 mg daily.  4. Lipitor 10 mg at bedtime.  5. She was also on lisinopril, but she does not remember the dose.   ALLERGIES:  None.   PHYSICAL EXAMINATION:  Patient is a very thin individual.  VITAL SIGNS:  Reviewed.  Head is normocephalic and atraumatic.  Eyes:  Pupils are equal, round  and reactive to light and accommodation.  No icterus was noted.  Oral  cavity:  Dry oral mucosa.  NECK:  Supple.  CHEST:  Bilateral fair air entry.  No crackles or rales.  HEART:  S1 and S2.  Regular rate and rhythm.  No murmurs are noted.  ABDOMEN:  Soft.  Bowel sounds are positive.  Does have epigastric  tenderness.  No guarding or rigidity.  No hepatosplenomegaly.  EXTREMITIES:  No edema.  CNS:  No focal motor or neuro deficits.   LABS:  White count 8.3, hemoglobin 14.3, hematocrit 42, platelets 282.  Sodium 134, potassium 4.1, chloride 108, bicarb 20, glucose 296, BUN 57,  creatinine 5.4, alkaline phosphatase 164, calcium level 10.1.  Urine  pregnancy test negative.  Urinalysis:  Significant for leukocyte  esterase and nitrates positive  0-2 WBCs.  Stool for occult blood is  negative.   IMPRESSION:  1. Acute-on-chronic kidney disease, likely secondary to dehydration.  2. Dehydration.  3. Uncontrolled insulin-dependent diabetes mellitus with diabetic      nephropathy.  4. Hypertension.  5. History of hypercholesterolemia.  6. Medical  noncompliance.   PLAN:  Admit the patient to the medical floor.  Patient currently  receiving 1 liter of normal saline bolus,  will continue normal saline  at the rate of 145 ml/hr.  Follow up the renal function daily.  Patient  does not seem to be in any DKA.  Her blood sugars are in the 300 range.  Will repeat the Glucometer.  Will start her on IV Lantus if blood sugars  are persistently elevated.  Then restart on Glucommander with an IV  insulin drip.  The abdominal pain, nausea and vomiting could be  secondary to the uncontrolled diabetes; however, the diarrhea is  unusual.  It could be secondary to the kidney failure also.  Will start  her on a clear-liquid diet and advance as tolerated.  We will obtain  stool studies for WBCs and C. diff.  If patient continued to have  abdominal symptoms, we may need to proceed with further workup, such as  a CT of the abdomen and pelvis.  Medication is Norvasc and Lipitor.  Blood pressure medications have to be adjusted as needed.  Patient also  needs to be followed by the case manager.  Home medications and follow-  up care have to  be arranged by the time of discharge.  Patient states  that she is currently unemployed and is unable to afford medications.  Some of the medicines she may be able to get at cheaper places.  However, this issue has to  be addressed prior to the discharge.  If her  renal function continued to get worse, will inform the nephrologist for  further workup.  Patient already has an AV fistula placed in the left  upper extremity.   DVT prophylaxis with heparin subcu.      Lurene Shadow, MD  Electronically Signed     TP/MEDQ  D:  06/02/2008  T:  06/02/2008  Job:  [id number]

## 2010-12-06 NOTE — H&P (Signed)
[name], [name] NO.:  [id number]   MEDICAL RECORD NO.:  [id number]          PATIENT TYPE:  INP   LOCATION:  1406                         FACILITY:  [facility]   PHYSICIAN:  Carey Bullocks. Gertie Exon, MD     DATE OF BIRTH:  03/04/1976   DATE OF ADMISSION:  09/06/2006  DATE OF DISCHARGE:                              HISTORY & PHYSICAL   CHIEF COMPLAINT:  Nausea, vomiting and dizziness.   HISTORY OF PRESENT ILLNESS:  [name] is a 34 year old female with a  history of type 1 diabetes, hypertension and chronic renal  insufficiency.  She was well until approximately 5 days ago, when she  began to develop, what she describes as, flu-like symptoms.  By this she  means muscle aches and chills.  Two days later she developed upper  respiratory symptoms, including nasal congestion and a cough productive  of clear phlegm.  For the last 2 days before presenting to the emergency  department she developed vomiting and was unable to hold down fluids.  She was not checking her blood sugars regularly at home, but on the day  of admission she did check her sugar once and noted that her blood sugar  was over 600.  She was also feeling very dizzy and having some  alterations to her vision when she was feeling dizzy at this time.  She  came to the emergency department at that point.   PAST MEDICAL HISTORY:  1. Poorly controlled type 1 diabetes.  2. Chronic renal insufficiency.  3. Hypertension.  4. Gastroparesis.   SOCIAL HISTORY:  No smoking, drugs or alcohol.   HOME MEDICATIONS:  1. Lantus insulin 20 units daily.  2. Humalog sliding scale.  3. Norvasc.  4. Glipizide.   ALLERGIES:  NO KNOWN DRUG ALLERGIES.   PHYSICAL EXAMINATION:  VITAL SIGNS:  Temperature 99.4, heart rate 111,  blood pressure 153/106, respiratory rate 20.  GENERAL APPEARANCE:  The patient appeared weak.  She was alert and  appropriate. She was constantly sniffling.  HEENT:  Mucous membranes were dry.  CHEST:   Clear to auscultation bilaterally.  CARDIOVASCULAR:  Tachycardia and hyperdynamic.  No murmurs.  ABDOMEN:  Normal bowel sounds.  Soft, nontender and nondistended.  EXTREMITIES:  Skin appeared dry, but there was no tenting.  Pulses were  2+/2 in all four extremities.  NEUROLOGIC:  Exam was grossly intact.   LABORATORY STUDIES:  Sodium 131, potassium 48, chloride 91, bicarbonate  22, BUN 73, creatinine 5.9, glucose 715.  Anion gap 17.  White blood  cell count 6.6, hemoglobin 10.2, hematocrit 29, platelets 277.  White  cell differential:  66% neutrophils, 26% lymphocytes.  Lipase and liver  enzymes were normal.  A urinalysis was not available.   ASSESSMENT AND PLAN:  1. DIABETIC KETOACIDOSIS.  The patient has a mixed acidosis disorder      with a normal bicarbonate, but an elevated anion gap.  It is likely      that she has a metabolic acidosis from DKA, and a metabolic      alkalosis from vomiting.  The patient will be  placed on the      Glucommander, and glucose will be monitored.  2. HYPOVOLEMIA.  Despite the patient's baseline renal insufficiency,      she is likely several liters volume depleted.  I will treat her      aggressively with intravenous saline, and we will monitor her vital      signs and tolerance of fluids.  3. ACUTE/CHRONIC RENAL FAILURE.  The patient's last creatinine was      2.6, approximately 3 weeks; today it is 5.9.  Her BUN has also      increased from 23 to 73.  This is likely all related to dehydration      and hyperglycemia.  As noted above, I will treat her aggressively      with intravenous saline. We will recheck her BUN and creatinine in      the morning.  4. HYPERTENSION.  This will be monitored.      Carey Bullocks. Gertie Exon, MD  Electronically Signed     PML/MEDQ  D:  09/06/2006  T:  09/06/2006  Job:  [id number]   cc:   Girard Cooter, MD  Fax: [phone]  Email: [email or web address]

## 2010-12-06 NOTE — Discharge Summary (Signed)
NAMEBEE, ROTI NO.:  [id number]   MEDICAL RECORD NO.:  [id number]          PATIENT TYPE:  INP   LOCATION:  5511                         FACILITY:  [facility]   PHYSICIAN:  Haywood Pao, M.D.DATE OF BIRTH:  [birth year]   DATE OF ADMISSION:  12/21/2005  DATE OF DISCHARGE:  12/31/2005                                 DISCHARGE SUMMARY   DIAGNOSES:  1.  Acute renal failure, likely secondary to urinary tract infection and      neurogenic bladder.  2.  Chronic kidney disease, stage III to IV, baseline creatinine of about 2.  3.  Diabetes mellitus type 1 with history of noncompliance, ketoacidosis at      this hospitalization.  4.  Hypertension.  5.  History of gastroparesis.  6.  Constipation   DISCHARGE MEDICATIONS:  1.  Reglan 5 mg p.o. a.c.  2.  Colace 100 mg twice daily.  3.  MiraLax powder once daily as needed if no BM.  4.  Iron sulfate 325 mg one tablet twice daily.  5.  Lantus 24 units subcu q.a.m.  6.  NovoLog 5 units subcu with each meal plus sliding scale.  7.  Ceftin 500 mg one tablet twice daily through January 05, 2006.  8.  Septra SS one tablet once daily once Ceftin completed.   DISCHARGE LABORATORY DATA:  Sodium 140, potassium 3.3 which was replaced,  BUN and creatinine 36 and 4.5 respectively, glucose slightly decreased at  55.   OTHER PERTINENT LABORATORY TESTING AND SCANS:  Patient had a CT of the  abdomen and pelvis done December 26, 2005, that showed a small amount of free  pelvic fluid and a thick-walled bladder collapsed around a Foley catheter.  No evidence of renal stones or hydronephrosis.  Ultrasound on admission  showed no dilatation of renal calyces.  Abdominal x-ray done on December 29, 2005, was positive only for constipation.   Patient's SPEP was within normal limits, UPEP was within normal limits.  Parathyroid hormone elevated at 73.1.  Patient also had complete C3 and C4  testing and autoimmune testing which was within normal  limits.   PHYSICAL EXAMINATION AT DISCHARGE:  GENERAL APPEARANCE:  No acute distress.  VITAL SIGNS:  Blood pressure 116/75, heart rate 94, respiratory rate 20,  temperature 96, sating 94% on room air.  HEENT:  Normocephalic and atraumatic.  PERRLA.  EOMI.  ENT is within normal  limits.  NECK:  Supple, no lymphadenopathy, JVD or bruit.  LUNGS:  Clear to auscultation bilaterally.  CARDIOVASCULAR:  Regular rate and rhythm, no murmurs, rubs, or gallops  appreciated.  ABDOMEN:  Soft, nontender with normal active bowel sounds.  EXTREMITIES:  No clubbing, cyanosis, or edema.  NEUROLOGIC:  Oriented x3.  Cranial nerves II-XII intact with normal  movements bilaterally.   HOSPITAL COURSE:  Deshunda Dudeck was admitted by myself late in the evening  on December 21, 2005, through [facility] with a BUN of  116 and creatinine of 15.5.  She had called the office earlier in the week  and was complaining of nausea and  vomiting.  She, however, had not checked  her ketones, did not have her meter nearby and was given instructions on how  to bring her sugars down.  She indicates that the day prior to her admission  that she had not taken her insulin and had checked her blood sugar only once  because I was tired.  She subsequently was admitted to the hospital and  transferred over to [facility]. [facility] as a consideration of  dialysis was being made.  Consultation was undertaken per Dr. Lorrene Reid with  renal service.  Patient was aggressively hydrated and started on  antibiotics.  Her urinary cultures grew out third generation cephalosporin-  sensitive as well as Cipro-sensitive cultures.  The organism could not be  identified.  It was thought to be similar to that of her prior two urinary  tract infections which were treated appropriately based on their culture and  sensitivities as well from her prior two hospitalizations.  Patient had  renal ultrasound and further work-up  which basically lead to the  conclusion  that she had neurogenic bladder and was not completely voiding.  A urologic  consultation was undertaken by Dr. Alinda Money who came to the same conclusion.  Her Foley was later removed and she continued to have high post void  residuals and she was instructed on voiding every three hours and double-  voiding if she continued to feel full and was able to do this reasonably  well up to the time of her discharge.   Over the time in the hospital, her creatinine continued a downward trend,  settling in the upper 4's, although it is still decreasing at this point.  At her time of discharge, urine output was good and was matched with IV  fluids as she had a post obstructive type diuresis.  Patient's diet was  expanded.  Her activity was continued and further diabetic teaching was  undertaken as an inpatient.  Patient was also able to view a video on  hemodialysis as this most likely will become an inevitability for her at  some point within at least the next couple of years given her overall renal  function.  Patient had complained of abdominal pain and x-ray showed  constipation.  Patient was given a bowel regimen and had bowel movements two  days before her discharge.  She continued to do well and was up in the halls  and walking and was discharged on December 31, 2005, without incident.   FOLLOW UP:  The patient is to follow up with Dr. Osborne Casco on January 05, 2006,  at 8:45 in the morning.  She is to bring a urine sample which will be  tested.  If this remains positive, her Ceftin will be continued another two  weeks.  If it is negative, she will take Septra SS once daily as  prophylaxis.  Patient is to follow up with Dr. Alinda Money in two to three weeks  and information about this has been given.  In addition, patient is to  follow up with Dr. Marval Regal and contact [place] once she is home and has completed her office visit on January 05, 2006,  with  myself.   INSTRUCTIONS TO PATIENT:  If patient develops any nausea or vomiting, she is  to test her ketones and if positive, contact our office for further  instructions.  Otherwise, she is to maintain her testing at least a.c. and  h.s. and is to test more  frequently if her blood sugars above 250.  This has  been instructed in detail to the patient in the past and was once again  reiterated during and at the conclusion of this hospitalization as well.      Haywood Pao, M.D.  Electronically Signed     RWT/MEDQ  D:  12/31/2005  T:  12/31/2005  Job:  [id number]   cc:   Dutch Gray, M.D.  Fax: [phone]   Donato Heinz, M.D.  Fax: [phone]

## 2010-12-06 NOTE — Op Note (Signed)
NAMECAYSON, [name]             ACCOUNT NO.:  [id number]   MEDICAL RECORD NO.:  [id number]          PATIENT TYPE:  AMB   LOCATION:  SDS                          FACILITY:  [facility]   PHYSICIAN:  Rosetta Posner, M.D.    DATE OF BIRTH:  1975/09/08   DATE OF PROCEDURE:  DATE OF DISCHARGE:  09/09/2006                               OPERATIVE REPORT   PREOPERATIVE DIAGNOSIS:  Chronic renal insufficiency.   POSTOPERATIVE DIAGNOSIS:  Chronic renal insufficiency.   PROCEDURE:  Attempted AV fistula creation with inadequate veins followed  by placement of left upper arm loop AV Gore-Tex graft.   SURGEON:  Rosetta Posner, M.D.   ASSISTANT:  Nurse.   ANESTHESIA:  MAC.   COMPLICATIONS:  None.   DISPOSITION:  To recovery room stable.   PROCEDURE IN DETAIL:  The patient was taken to the room, placed in the  supine position.  The left wrist, left arm was prepped and draped in the  usual sterile fashion.  Incision was made over the cephalic vein a the  level of the wrist and this was too small for dialysis access.  A  separate incision was made, then using local anesthesia at the  antecubital space.  Again, the cephalic vein was mobilized.  This was  thrombosed and was sclerotic and inadequate for fistula.  The brachial  vein and brachial artery at the antecubital space were extremely small  and were not felt to be adequate for access.  A separate incision was  made over the axilla and the axillary vein and artery were small to  moderate sized, but felt to be adequate.  A separate incision was made  over the distal upper arm.  In a loop configuration, a tunnel was  created and a 6 mm Gore-Tex graft standard wall stretched and brought  through the tunnel.  The vein at the axilla was occluded proximally and  distally and was opened with the 11 blade scissors.  The graft was  spatulated, sewn end to side to the vein with running 6-0 Prolene  suture.  Clamps were removed from the vein and the  graft site and re-  occluded.  Next, the artery was occluded proximally and distally and a  small  arteriotomy was created.  The graft was sewn end to side to the artery.  Clamps were removed and good thrill was noted.  The wound was irrigated  with saline and checked with cautery.  The wound was closed with 3-0  Vicryl in the subcutaneous, subcuticular tissues and the Steri-Strips  were applied.      Rosetta Posner, M.D.  Electronically Signed     TFE/MEDQ  D:  09/09/2006  T:  09/10/2006  Job:  [id number]   cc:   [facility]

## 2010-12-06 NOTE — H&P (Signed)
[name], [name]             ACCOUNT NO.:  [id number]   MEDICAL RECORD NO.:  [id number]          PATIENT TYPE:  INP   LOCATION:  0101                         FACILITY:  [facility]   PHYSICIAN:  Berneta Sages, M.D.        DATE OF BIRTH:  October 09, 1975   DATE OF ADMISSION:  11/14/2005  DATE OF DISCHARGE:                                HISTORY & PHYSICAL   CHIEF COMPLAINT:  Nausea and vomiting.   HISTORY OF PRESENT ILLNESS:  This is a 35 year old African-American female  with a long history of type I diabetes mellitus complicated by nephropathy,  retinopathy, and recurrent episodes of DKA, last admitted in late March,  2007 when her DKA was associated with pyelonephritis.  She is followed on a  regular basis by Dr. Donato Heinz for chronic kidney disease with a  baseline creatinine of approximately 1.5 to 2.  She has also seen Dr. Verl Blalock for diabetic gastroparesis and was prescribed Reglan and proton  pump inhibitor, for which she has been somewhat noncompliant.  Please note  that per her records, she has been noncompliant with using insulin following  six day rules on a regular basis.  Please note also that she was pregnant in  early 2007 and had a follow-up hospitalization with C-section delivery of  her daughter at [facility].  She was last seen in the  office by Dr. Osborne Casco in mid March, 2007, at which time her hemoglobin A1C  was 6.7% on Lantus and sliding scale Humalog.  She was discharged from the  hospital in late March, 2007 for a DKA and pyelonephritis, to follow up with  outpatient Cipro therapy for her urinary tract infection.  She also since  that time has seen Dr. Marval Regal as well as Dr. Sharlett Iles for the above-  mentioned issues.  She has been also started on Aranesp.   Today, she presents to [facility] emergency room with a one day history of  nausea and vomiting and a three day history of anorexia.  Patient denies any  fevers or chills,  cough, headaches, difficulty swallowing, neck pain,  shortness of breath, chest pain, abdominal discomfort, lower extremity  swelling, new musculoskeletal deficits, or new neurological deficits.  She  does state that she has not taken her medications secondary to her anorexia,  nausea and vomiting, for the last 1-2 days.  She does state that she has  been compliant with taking her basal Lantus 26 units each day, despite her  current illness.  Upon her presentation to the emergency room, she was found  to have a serum glucose of 833, serum bicarb 21, and an anion gap consistent  with acidosis as well as ketones in her urine.  I was subsequently called to  admit.  Chest x-ray, urinalysis, ABG, liver function tests, and CBC have not  been ordered.  These have been subsequently ordered by myself.   REVIEW OF SYSTEMS:  As above.   PROBLEM LIST:  1.  Type 1 diabetes since the age of 9, complicated by nephropathy and  retinopathy.  2.  Chronic renal insufficiency with a baseline creatinine of 1.5 to 2,      followed by Dr. Donato Heinz.  3.  Hypertension.  4.  Rheumatoid arthritis, on no therapy.  Denies recent use of nonsteroidal      anti-inflammatory agents.  5.  Gravida 2, para 1 with daughter being born by cesarean section on      September 04, 2005.  6.  Questionable depression.   CURRENT MEDICATIONS:  Unclear at this time but include lisinopril 10 mg each  day, Norvasc 10 mg each day, Lantus insulin 26 units each day, Humalog  sliding scale.  Supposedly, she is on labetalol 400 mg daily, Reglan 5 mg  q.a.c. and 10 mg at bedtime.  She has also received a prescription for  AcipHex 20 mg each day from Dr. Sharlett Iles, although again, it is unclear  whether she is taking this, and she is somewhat vague on her medication use.   ALLERGIES:  No known drug allergies.   FAMILY HISTORY:  Grandparents have type 2 diabetes mellitus, and there is a  strong family history of  hypertension.   SOCIAL HISTORY:  The patient lives with her aunt and grandmother.  Has a  daughter living with her.  She has a previous job in Therapist, art.  She  denies any drug use, alcohol use or tobacco abuse.   PHYSICAL EXAMINATION:  VITAL SIGNS:  Temperature 100 degrees Fahrenheit,  blood pressure 125/79 after a previous blood pressure of 168/96 on  presentation.  Heart rate is roughly 106.  Respirations 20 and unlabored.  Oxygen saturation 97% on room air.  GENERAL:  We have an African-American female lying flat on a hospital  stretcher in no apparent distress with a soft voice and flat affect.  HEENT:  Sclerae are anicteric.  Extraocular movements are intact.  There are  no oropharyngeal lesions.  NECK:  Supple.  Tympanic membranes are clear bilaterally.  There is no  cervical lymphadenopathy.  LUNGS:  Clear to auscultation bilaterally.  CARDIOVASCULAR:  Regular rate and rhythm, somewhat tachycardic.  No murmurs  appreciated.  ABDOMEN:  Soft, nondistended abdomen.  Bowel sounds are present.  She is,  surprisingly to the patient, tender over the suprapubic area.  EXTREMITIES:  No edema.  Pedal pulses are intact.  There is no cyanosis.  There is a midline scar on the abdomen, which is well healed.  NEUROLOGIC:  Grossly nonfocal with the patient being alert and oriented x3.   Laboratory evaluation so far reveals sodium 128, potassium 5.8, chloride 87,  carbon dioxide 21, glucose 833, BUN 46, creatinine 3.5, calcium 9.1.  Other  labs are pending, as ordered by me.   ASSESSMENT/PLAN:  1.  Diabetic ketoacidosis, presumably in a setting of medication      noncompliance, poor use of six day rules in a type I diabetic patient      and presumed infection.  Will need to rule out urinary tract infection.      Will start empirically on Cipro, given her suprapubic tenderness.  Will     also continue aggressive fluid hydration, IV insulin via Glucomander,      and transition to  multiple daily injections, as appropriate.  2.  Questionable urinary tract infection, based on exam:  Will follow up on      urinalysis, cultures, and blood cultures, and dose Cipro per      pharmaceutical protocol, based on elevated creatinine and chronic renal  failure.  3.  Chronic renal insufficiency, as above, but suspect some of her elevated      creatinine at this point is due to      prerenal azotemia and will follow fluid status carefully as well as      aggressively hydrate.  4.  Hypertension:  Again, will restart Norvasc and possibly initiate ACE      inhibitor and possibly labetalol as blood pressure dictates and fluid      hydration continues.      Berneta Sages, M.D.  Electronically Signed     RA/MEDQ  D:  11/14/2005  T:  11/14/2005  Job:  [id number]   cc:   Haywood Pao, M.D.  Fax: [phone]   Donato Heinz, M.D.  Fax: [phone]

## 2010-12-06 NOTE — Discharge Summary (Signed)
[name], [name] NO.:  [id number]   MEDICAL RECORD NO.:  [id number]          PATIENT TYPE:  INP   LOCATION:  1619                         FACILITY:  [facility]   PHYSICIAN:  Haywood Pao, M.D.DATE OF BIRTH:  1975/12/07   DATE OF ADMISSION:  10/10/2005  DATE OF DISCHARGE:  10/13/2005                                 DISCHARGE SUMMARY   DIAGNOSIS:  Diabetic ketoacidosis.   SECONDARY DIAGNOSES:  1.  Noncompliance.  2.  Diabetes mellitus type 1 with nephropathy.  3.  Hypertension.  4.  Status post recent urinary infection six weeks ago at [facility].   DISCHARGE MEDICATIONS:  1.  Lantus insulin 26 units subcu daily.  2.  Humalog insulin sliding scale.  3.  Lisinopril 10 mg por daily.  4.  Norvasc 10 mg p.o. daily.  5.  Labetolol 400 mg p.o. daily.  6.  Cipro 500 mg p.o. b.i.d., to be taken through October 17, 2005.   As of the date of discharge, sodium is slightly low at 134; however, there  is no evidence of anion gap.  Potassium normal at 4.5.  BUN and creatinine  are 28 and 1, respectively, secondary to chronic renal insufficiency.  Chloride 102, bicarb 24, morning glucose 247.  Hemoglobin 10.5, hematocrit  31.7.   VITALS:  Blood pressure 141/94, heart rate 98, respiratory rate 19,  temperature 99.8.  General:  No acute distress.  HEENT:  PERRLA, EOMI, ENT within normal limits.  Lungs:  Clear to auscultation.  Heart:  Regular rate and rhythm, no murmur.  Abdomen:  Soft, nontender, normal bowel sound activity.  Extremities:  No edema.  Neuro:  Oriented x3, otherwise intact.   Urinalysis shows positive white blood cells.  Urine culture was negative but  then on the antibiotics.  Blood cultures x2 are negative.   Chest x-ray negative.   HOSPITAL COURSE:  Patient is well known to me.  Medication noncompliance  issues with relation to her diabetes.  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  She  was delivered via C-section approximately six weeks ago, and her child has  just returned home this week.  Contacted our office late Friday afternoon,  indicating that she was having nausea, blood sugars of 500.  She was  instructed that she was not to discontinue her insulin, which she had done,  and to use a liberal sliding scale.  She opted not to do.  She instead came  to the emergency room and was subsequently admitted for diabetic  ketoacidosis.  She indicated that she had a blood sugar of 147, so  discontinued her insulin entirely, despite our recommendations.  The patient  was initially put on Glucomander protocol in the emergency room.  Her sugars  quickly normalized.  Her anion gap improved.  The patient was observed with  fluids and glucose up on the floor.  The patient was  doing reasonably well  on a subcu regimen, very consistent with her home.  Her appointment was  somewhat decreased, but she was capable of eating and was discharged the  morning of October 13, 2005 in good condition.  In addition, her blood  pressure normalized with continuation of her medications, as noted above.   I spoke to [place] for a long period of time about social issues, mostly  related to the baby coming home, and her aunt is currently taking care of  her now.  Indicated that she needed to stay well in order to be able to take  care of her child.  This may actually be a barrier to her in some way.  Indicated that if she does not take insulin, her body will feel as if it is  going into starvation and that she must at least maintain her long-acting  insulin no matter what.   CONDITION ON DISCHARGE:  Stable.   FOLLOW UP:  Patient is to continue regular followup through my office.  She  has not been amenable to keeping her appointments with diabetic education in  the past.  So, we will  attempt to get them to reschedule for her at her  convenience, now that she has to take care of an infant at home.      Haywood Pao, M.D.  Electronically Signed     RWT/MEDQ  D:  10/13/2005  T:  10/14/2005  Job:  [id number]

## 2010-12-06 NOTE — Consult Note (Signed)
[name], [name] NO.:  [id number]   MEDICAL RECORD NO.:  [id number]          PATIENT TYPE:  INP   LOCATION:  5511                         FACILITY:  [facility]   PHYSICIAN:  Raynelle Bring, MD      DATE OF BIRTH:  Mar 22, 1976   DATE OF CONSULTATION:  12/25/2005  DATE OF DISCHARGE:                                   CONSULTATION   REASON FOR CONSULTATION:  1.  Acute renal failure.  2.  Recurrent urinary tract infections.  3.  Possible neurogenic bladder.   HISTORY:  [name] is a 35 year old African-American female with poorly  controlled type 1 diabetes mellitus complicated by retinopathy, neuropathy,  nephropathy, and gastroparesis.  She has had a problem with medical  noncompliance.  Over the course of the past 3-4 months, she has had multiple  urinary tract infections/pyelonephritis episodes which have resulted in  diabetic ketoacidosis and inpatient hospitalization.  All urine cultures  have been positive for the same bacterial organisms, Enterobacter aerogenes.  I have not seen any negative urine cultures inbetween her positive urine  cultures, raising the question of bacterial persistence versus recurrence.  She has been treated with multiple courses of antibiotics. I am unsure which  antibiotics she has been treated with previously. However, she has continued  to redevelop her symptoms and infection.  Most recently, she was brought to  the emergency room on December 21, 2005.  At that time, she was feeling poorly  with general malaise.  She denies any fever or back pain or dysuria at that  time.  She was subsequently found to be in acute renal failure with a serum  creatinine of 15.6.  Her urinalysis at that time demonstrated too numerous  to count white blood cells, 0-2 red blood cells, and many bacteria with a  urine pH of 6.0.  She has subsequently been managed with Foley  catheterization as well as hydration and her serum creatinine is now 7.7.  She also  underwent a renal ultrasound December 22, 2005, which demonstrated no  evidence of a perinephric abscess, hydronephrosis, urolithiasis, or renal  masses.  It did demonstrate increased bilateral echogenicity consistent with  medical renal disease.   UROLOGIC REVIEW OF SYSTEMS:  [name] states that she does not usually  have problems with urinary frequency, urgency, or dysuria.  She denies  urinary incontinence.  In fact, she states that she voids very infrequently,  approximately twice per day.  She states that she does have poor bladder  sensation and does not feel that she empties her bladder adequately.  I am  unsure of whether the patient was in urinary retention or had a large  postvoid residual upon the hospitalization.  She denies a history of gross  hematuria, history of urinary tract infections prior to this year,  urolithiasis, GU malignancy, trauma, or surgery.   PAST MEDICAL HISTORY:  1.  Diabetes mellitus with resulting gastroparesis, nephropathy, neuropathy,      and retinopathy.  2.  Medical noncompliance.  3.  Anemia.  4.  Hypertension.  5.  Rheumatoid arthritis.   PAST  SURGICAL HISTORY:  C-section on February15,2007.   MEDICATIONS:  Home medications include:  1.  Norvasc.  2.  Labetalol.  3.  Lentis  4.  NovoLog.  5.  Reglan.  6.  Aranesp.   Hospital medications include ceftriaxone, Aranesp, insulin, Reglan,  Protonix, Phenergan, sorbitol, and Ambien.   ALLERGIES:  No known drug allergies.   FAMILY HISTORY:  Positive for diabetes and hypertension.  There is no  history of urolithiasis or GU malignancy.   SOCIAL HISTORY:  The patient denies tobacco or alcohol use.   REVIEW OF SYSTEMS:  A review of systems is positive for nausea and vomiting.  She specifically has denied any fevers, chills or back pain.  She denies any  shortness of breath or chest pain.  All other systems are reviewed and  otherwise negative.   PHYSICAL EXAMINATION:  CONSTITUTIONAL:  The  patient is a well nourished, well developed age  appropriate female in no acute distress.  VITAL SIGNS:  Temperature 96.7, pulse 95, respirations 20, blood pressure  151/91.  HEENT:  Normocephalic, atraumatic, oropharynx is clear.  NECK:  No JVD or thyromegaly.  LYMPH NODE SURVEY:  No cervical, supraclavicular, or inguinal  lymphadenopathy.  CARDIOVASCULAR:  Regular rate and rhythm without obvious murmurs.  LUNGS:  Clear bilaterally.  ABDOMEN:  Soft and nondistended without abdominal masses.  The patient does  have tenderness on palpation of her suprapubic region.  No rebound or  guarding.  BACK:  Mild to moderate bilateral CVA tenderness  GU:  The patient has an indwelling Foley catheter draining grossly clear  urine.  EXTREMITIES:  No edema.  NEUROLOGIC:  Grossly intact.   LABORATORY DATA:  Serum creatinine is currently 7.7.  Urinalysis and urine  culture are as stated in the history.  Renal ultrasound results are as  dictated in the history.   IMPRESSION AND RECOMMENDATIONS:  1.  Acute renal failure:  I would recommend leaving the Foley catheter in      place until the final sensitivities are back from the patient's urine      culture.  The patient will then need to be closely monitored with post      void residuals and possibly will need clean intermittent catheterization      if she does appear to have a neurogenic bladder.  2.  Urinary tract infections:  These may represent bacterial persistent or      recurrent urinary tract infections.  She is currently on ceftriaxone      which is appropriate coverage based on her prior urine culture results.      She will require further evaluation.  I would begin with a CT scan of      the abdomen and pelvis without contrast to definitively rule out stone      disease.  She then will likely require cystoscopy and possibly a voiding      cystourethrogram.  She also will potentially need urodynamics as an     outpatient to assess her for  what appears to be a neurogenic bladder.  3.  Possible neurogenic bladder:  It did appear that the patient probably      does have a neurogenic bladder due to her      poorly controlled diabetes.  She will need to continued surveillance to      ensure that she does have adequate bladder emptying.  At this time, it      is appropriate to continue her with an indwelling  Foley catheter.  She      likely will require urodynamic evaluation as an outpatient to further      assess her bladder.           ______________________________  Raynelle Bring, MD  Electronically Signed     LB/MEDQ  D:  12/25/2005  T:  12/25/2005  Job:  [id number]   cc:   Haywood Pao, M.D.  Fax: [phone]

## 2010-12-06 NOTE — Consult Note (Signed)
[name], Werner             ACCOUNT NO.:  [id number]   MEDICAL RECORD NO.:  [id number]          PATIENT TYPE:  INP   LOCATION:  5511                         FACILITY:  [facility]   PHYSICIAN:  Caren Griffins B. Lorrene Reid, M.D.DATE OF BIRTH:  1976/04/16   DATE OF CONSULTATION:  12/22/2005  DATE OF DISCHARGE:                                   CONSULTATION   REQUESTING PHYSICIAN:  Haywood Pao, M.D.   REASON FOR CONSULTATION:  Acute on chronic renal failure.   HISTORY:  [name] is a 35 year old African-American female who has a  history of longstanding insulin-requiring diabetes which has been poorly  controlled in part due to medical noncompliance.  She has a known history of  chronic kidney disease.  Her serum creatinine was 2 on November 17, 2005.  She  presented to the [facility] Emergency Department earlier today with at  least a week of protracted nausea, vomiting, inability to keep down p.o.  food or fluids and decreased urine output.  Her laboratory studies in the  emergency department were notable for a BUN of 160, creatinine 15.6,  potassium 6, glucose of 469 and purulent-appearing urine with too-numerous-  to-count white cells. She was felt to be clinically dry on exam and, with a  blood pressure in the 90s, she was started on normal saline at 200 mL an  hour along with an insulin drip.  She is now admitted to [facility] in  transfer for further investigation of the etiology of her acute decline in  renal function plus possible dialysis if there is no improvement.   Of note, she has had acute renal failure superimposed on chronic in the  past, most recently November 14, 2005, when she was admitted with diabetic  ketoacidosis and an Enterobacter urinary tract infection.  Serum creatinine  was 3.5 on admission and came down to 2 by the time of discharge on April  30th.   Also of note is the fact that she has had recurrent urinary tract infections  with the same Enterobacter  during a March as well as an April  hospitalization   Over the past week she has not used any nonsteroidals or street drugs.  She  has been nauseated and keeping down a little if any p.o. for at least a  week, but perhaps longer.   PAST MEDICAL HISTORY.:  1.  Diabetes type, 1, longstanding, with poor medical compliance and      recurrent episodes of DKA.  2.  Chronic kidney disease (followed by Dr. Marval Regal); last creatinine was      2 on November 17, 2005.  3.  Retinopathy secondary to diabetes.  4.  Hypertension.  5.  Rheumatoid arthritis on no medication.  6.  Status post C-section September 04, 2005;  healthy daughter.  7.  History of recurrent urinary tract infections.  Most recently October 11, 2005, and November 14, 2005, with Enterobacter aerogenes.  8.  Gastroparesis due to diabetes (Dr. Sharlett Iles).  9.  History of anemia with Aranesp prescribed in the past.  10. History of medical  noncompliance.   OUTPATIENT MEDICATIONS:  1.  Norvasc 10 mg a day.  2.  Labetalol 100 mg twice a day.  3.  Reglan 5 mg 3 times a day before meals or food.  4.  Lantus insulin 26 units of bedtime.  5.  NovoLog sliding scale insulin.   CURRENT MEDICINES:  1.  IV normal saline at 200 mL an hour.  2.  IV insulin on the Glucomander protocol.  3.  Unasyn per pharmacy.  4.  Ambien.  5.  Laxative of choice.  6.  Phenergan 25 mg every 4 hours as needed.   FAMILY HISTORY:  Is positive for diabetes and hypertension.   SOCIAL HISTORY:  The patient is unmarried.  She lives with an aunt and her  grandmother who care for her now 21-month-old daughter.  She does not use  alcohol, tobacco or street drugs.   REVIEW OF SYSTEMS:  Is positive for nausea and vomiting for at least a week,  suprapubic pain, decreased urine output for 3-5 days.  She has had no  fevers, chills or back pain.  No flank pain.  She has had increased thirst  with uncontrolled blood sugars greater than 400.  No hematemesis, melena,   hematochezia, or diarrhea.   PHYSICAL EXAMINATION:  GENERAL:  She is a slight black female, with a very  flat affect, who speaks very, very softly.  VITAL SIGNS:  Supine blood pressure was 105/60. She was afebrile.  NECK:  Her neck veins were flat in the supine position.  Turgor was reduced  over all.  LUNGS: Lung fields were clear.  CARDIAC:  S4, S1-S2. She has no pericardial rub.  There was no S3.  ABDOMEN: Was scaphoid.  She has scars from prior surgeries.  Bowel sounds  were present, although hypoactive.  She had mild diffuse tenderness but was  most tender in the suprapubic region.  There was no palpable organomegaly.  EXTREMITIES: She has some deformities of  her fingers bilaterally.  There is  no edema of the lower extremities.  Dorsalis pedis and posterior tibial  pulses were 1+ and equal.  NEUROLOGIC: She had no asterixis.  She is oriented x3 but very flat and  subdued and very appropriate.  She was alert and not lethargic.   LABORATORY DATA:  WBC 10,300 with 87% polys, hemoglobin 12.1, platelets  596,000.  Sodium 125, potassium 6, chloride 69, CO2 29, BUN 160, creatinine  15.6, glucose 469. Anion gap was 25.  Arterial blood gas showed pH 7.44, pO2  73, pCO2 28. Ketones were present, small. Urinalysis shows turbid urine,  specific gravity 1.016, large blood, greater than 300 mg percent protein,  and too-numerous-to-count white cells.   IMPRESSION:  A 35 year old black female with a history of chronic kidney  disease with a baseline creatinine of 2 on November 17, 2005, who presents  with:  1.  Acute renal failure: This is occurring in the setting of protracted      nausea, vomiting, uncontrolled diabetes.  She has a mixed acid base      disorder with what appears to be both and anion gap metabolic acidosis      as well as a metabolic alkalosis on the basis of her vomiting was severe     chloride depletion.  She has very severe acute renal failure.  Her urine      looks  infected. We need to rule out other causes for her acute decline      (  check ultrasound to rule out obstruction primarily).  She needs      treatment for volume depletion, which she is getting, as well as      antibiotics for infection.  She has had two recent infections with      Enterobacter, so would use of antibiotic to which we know that      particular organism is sensitive.  With regard to her hyperkalemia, this      should improve with control for diabetes.  If renal function does not      improve rather dramatically with IV fluids and antibiotics, she may very      well require dialysis.  2.  Anemia: Aranesp has been prescribed for her in the past.  It is unclear      that she has actually been going for injections.  We will get office      records tomorrow.  3.  Diabetic ketoacidosis per primary service.  4.  Hypertension: Would hold medications since her blood pressures are on      the low side and she is quite volume depleted.  5.  Recurrent urinary tract infection:  Will discuss with Dr. Osborne Casco,      possibly switching her to either Rocephin or Cipro since her last 2      infections were with the same organism.  She may need a urologic      evaluation to make sure she does not have a neurogenic bladder some      other nidus for recurrent infections with the same organism.  6.  Gastroparesis:  IV Reglan.           ______________________________  Elzie Rings. Lorrene Reid, M.D.     CBD/MEDQ  D:  12/22/2005  T:  12/22/2005  Job:  [id number]   cc:    [facility]   Haywood Pao, M.D.  Fax: [place]. Sharlett Iles, M.D. [facility]  [address]  [place] [address]

## 2010-12-06 NOTE — Discharge Summary (Signed)
[name], WALDRUM NO.:  [id number]   MEDICAL RECORD NO.:  [id number]          PATIENT TYPE:  OBV   LOCATION:  9196                          FACILITY:  [facility]   PHYSICIAN:  Diona Foley, M.D.   DATE OF BIRTH:  [birth year]   DATE OF ADMISSION:  07/21/2005  DATE OF DISCHARGE:  07/22/2005                                 DISCHARGE SUMMARY   ADMITTING DIAGNOSES:  A 25 week intrauterine pregnancy with poorly  controlled insulin dependent diabetes with significant hyperglycemia.   DISCHARGE DIAGNOSES:  A 25 week intrauterine pregnancy with poorly  controlled insulin dependent diabetes with significant hyperglycemia.   HOSPITAL COURSE:  HISTORY OF PRESENT ILLNESS:  Please see admission history  and physical for details.  Briefly, this is a 35 year old gravida 2, para 0-  0-1-0 African-American female, who is at 25+ weeks gestation with a due date  of October 30, 2005 by first trimester ultrasound, who has known insulin  dependent diabetes that is poorly controlled.  The patient has been managed  by the [facility], as well as by her primary care physician, Dr.  Domenick Gong for management of her blood sugars.  In addition, the  patient has known diabetic nephropathy and retinopathy.  The patient was  admitted on July 21, 2005 after she called the physician's office and  reported a blood sugar of 500.  Upon arrival at the hospital the sugar was  still elevated, but less than 200.  She was subsequently admitted overnight  for observation, IV fluids and management with insulin.  On hospital day #2,  the patient was transferred to [facility] for additional  management and recommendations given her poorly controlled diabetes and  multiple comorbidities.  Transfer was accepted on July 22, 2005.  The  patient was transferred by [facility] without difficulty.      Diona Foley, M.D.  Electronically Signed     JW/MEDQ  D:  08/14/2005   T:  08/14/2005  Job:  [id number]

## 2010-12-06 NOTE — H&P (Signed)
[name], [name] NO.:  [id number]   MEDICAL RECORD NO.:  [id number]          PATIENT TYPE:  MAT   LOCATION:  MATC                          FACILITY:  [facility]   PHYSICIAN:  Diona Foley, M.D.   DATE OF BIRTH:  [birth year]   DATE OF ADMISSION:  07/21/2005  DATE OF DISCHARGE:                                HISTORY & PHYSICAL   ADMITTING DIAGNOSIS:  A 25-week intrauterine pregnancy with insulin-  dependent diabetes, poorly controlled.   HISTORY OF PRESENT ILLNESS:  This is a 35 year old gravida 2, para 0-0-1-0  African-American female who has a 25 plus gestation with a due date of October 30, 2005 by first trimester ultrasound, who presented to maternity  admissions this evening after her blood sugar at home was markedly elevated  at 500.  The patient has a known history of poorly-controlled insulin-  dependent diabetes with significant nephropathy and retinopathy.  The  patient has a history of noncompliance and a history of DKA approximately 6  months ago.  The patient has been having difficulty with nausea.  She has  been taking oral Phenergan and Zofran to help treat nausea; however, this  morning a person in her home found her unconscious.  She was taken by  ambulance to [facility].  Blood sugar there was 42.  The patient  reports she did not receive any insulin while she was the hospital, but  received IV fluids.  She was monitored there throughout the day and was  discharged to home at approximately 4 p.m.  Her episode of hyperglycemia  with her blood sugar at 500 was at approximately 9 p.m. this evening.  She  took her normal dose of insulin at night, which is 25 units of Lantus, and  also took 16 units of NovoLog.  Upon arrival at maternity admissions, her  blood sugar was 191.  The patient presently denies any significant nausea or  vomiting since being sent home from the hospital earlier today.  She denies  any chest pain, shortness of breath.   No fever, chills, sweats or dysuria.  She reports movement.  Denies loss of fluid, vaginal bleeding or  contractions.   PAST MEDICAL HISTORY:  1.  Poorly controlled insulin-dependent diabetes with significant      nephropathy and retinopathy.  Primary care physician is Haywood Pao, M.D.  Nephropathy is managed by the [facility].  2.  The patient has a history of DKA.  3.  Rheumatoid arthritis.   PAST SURGICAL HISTORY:  None.   OBSTETRIC HISTORY:  TAB x1.   GYNECOLOGIC HISTORY:  Positive for Chlamydia in 1997.  No history of  abnormal Pap smears or STDs.   SOCIAL HISTORY:  She denies tobacco, alcohol or drugs.   FAMILY HISTORY:  Hypertension and diabetes.  No history of birth defects,  congenital anomalies, Down syndrome, spina bifida, mental retardation.   PHYSICAL EXAMINATION:  VITAL SIGNS:  Blood pressure is 133/87, afebrile,  pulse 103, respirations 20, blood glucose finger stick 199.  Height 5 feet,  2 inches.  Weight 128 pounds.  GENERAL:  She is a well-developed, well-nourished African-American female  who appears in no acute distress.  She is alert and oriented x3.  HEART:  Regular rate and rhythm.  LUNGS:  Clear to auscultation bilaterally.  ABDOMEN:  Gravid, soft, and nontender.  EXTREMITIES:  No clubbing, cyanosis, or edema, and nontender.  PELVIC EXAM:  Deferred.  Fetal heart tones 150s by Doppler.   PRENATAL LABORATORIES:  Blood type is B positive, antibody screen negative,  RPR nonreactive, rubella immune, hepatitis B surface antigen nonreactive,  HIV nonreactive.  Group B beta strep is unknown.  Ultrascreen within normal  limits.  Sixteen week alpha-fetoprotein within normal limits.  Sixteen week  anatomy ultrasound and 24-week echocardiogram performed by [facility]  within normal limits.  First trimester hemoglobin A1C was 11.  TSH within  normal limits.  A 24-hour urine showed 2 gm of protein approximately.  First  and second  trimester preeclampsia laboratory studies all within normal  limits.  Initial laboratory studies here - urine shows greater than 1000 of  glucose, 100 of protein, small amount of blood, negative ketones, negative  nitrates, negative leukocytes.  Specific gravity is normal.  The pH is  normal.  CBC revealed a white count of 8.5, platelet count 376.  Hemoglobin  8.4, hematocrit 23.7.   ASSESSMENT:  A 35 year old gravida 2, para 0-0-1-0 African-American female  at [redacted] weeks gestation by first trimester ultrasound with poorly controlled  insulin-dependent diabetes with nephropathy and retinopathy, with episode of  significant hypoglycemia earlier today, followed by an episode of  hyperglycemia.   PLAN:  1.  Will admit for observation overnight to check serial blood sugars.  Will      treat nausea as needed with IV anti-emetics, and will administer insulin      as needed.  If patient is able to tolerate a regular diet, will resume      her usual insulin regimen and will consult diabetes education in the      morning for further education.  2.  No nephropathy.  The patient is already on labetalol for nephropathy per      [facility].  Will start a 24-hour urine to evaluate for any      progression of nephropathy.  Preeclampsia labs drawn within the last 48      hours within normal limits.  3.  Fetal well-being.  Fetal heart rate tracing unable to be traced      secondary to early gestation and fetal movement.  The patient has had an      ultrasound within the last 2 weeks which showed an appropriately growing      fetus and a normal fetal echocardiogram.  4.  The patient's diabetes has been managed by [facility]      throughout her pregnancy.  Will contact them in the morning for further      recommendations and for transfer of care.      Diona Foley, M.D.  Electronically Signed    JW/MEDQ  D:  07/21/2005  T:  07/22/2005  Job:  [id number]

## 2010-12-06 NOTE — Discharge Summary (Signed)
[name], [name] NO.:  [id number]   MEDICAL RECORD NO.:  [id number]          PATIENT TYPE:  INP   LOCATION:  1616                         FACILITY:  [facility]   PHYSICIAN:  Haywood Pao, M.D.DATE OF BIRTH:  05/04/1976   DATE OF ADMISSION:  01/21/2005  DATE OF DISCHARGE:  01/23/2005                                 DISCHARGE SUMMARY   DISCHARGE DIAGNOSES:  1.  Diabetic ketoacidosis secondary to noncompliance of medical regimen.  2.  Urinary tract infection.  3.  Hypokalemia related to #1.   DISCHARGE MEDICATIONS:  1.  Lantus insulin 25 units subcu q.h.s.  2.  NovoLog per continued sliding scale.  The patient gets NovoLog pens as      disposable.  3.  Septra DS one tablet p.o. b.i.d. x3 days.   DISCHARGE LABORATORY DATA AND X-RAY FINDINGS:  Sodium 139, potassium 3.1  (replaced orally), BUN and creatinine 10 and 0.7, bicarb elevated at 22 with  no anion gap present, chloride 110.  UA showed 20+ wbc's, LE large, rbc's 7-  10.  Urine culture pending at this time.  Initial blood was small, but  counted on admission at 16.1, hemoglobin 15.5, hematocrit 46.7, platelets  353.   HOSPITAL COURSE:  [name] is a 35 year old, African-American  female seen in the office for approximately 1 year.  She indicated that she  was out of the state last year until January of this year, but did not  schedule any subsequent follow-up appointments and was lost to follow up.  The patient ran out of her insulin and chose not to call the office, but  instead had regular insulin filled over-the-counter and was treating  herself.  She stopped her insulin a couple of days ago due to difficulty  with her regimen, but once again did not contact her office for her  appointments or refills.  She subsequently developed nausea and confusion  consistent with diabetic ketoacidosis and was taken to [facility] Emergency  Room and subsequently admitted by my partner Dr. Philip Aspen.   She was  initially started on Glucommander protocol and given D-5 normal saline.  Once her bicarb had elevated and her anion gap had closed, the patient was  given 12 units of NPH and continued on a sliding scale over the course of  the day on July 5.  She was given her usual Lantus dose the evening of July  5, and had a hypoglycemic episode secondary to over compensation from  sliding scale for an 8 p.m. blood sugar greater than 200.  The patient was  given 15 g of carbohydrates early on the morning of July 6, and blood sugars  then stabilized.  The patient was found to be in good condition the morning  of January 23, 2005.  She was eating without difficulty the night prior and  looked to be back to normal.   Long discussion was taken about the patient's responsibility and contacting  us if she does leave the state at the point that she needs refills and for  continued care.  Given that there was  a 6 month lapse, this was also  expressed to the patient's family who is present at the time of my seeing  her on January 22, 2005, and that they should call on her behalf if she fails to  do so.  The patient was reminded of the ADA guidelines for followup for  fairly well-controlled type 1 diabetics is every 3 months and that I keep my  patient's on that same regimen.  The patient's follow-up appointment has  been scheduled for Wednesday, July 12, at 11 a.m. at [facility] by  myself.  At this point, we will do an A1c which will probably be suboptimal  secondary to her suboptimal use of insulin over the past 6 months.  At that  time, we will do other cardiovascular assessments as per our routine.   CONDITION ON DISCHARGE:  Stable.  The patient may return to work starting  tomorrow January 24, 2005.  A work note was written for the above.       RWT/MEDQ  D:  01/23/2005  T:  01/23/2005  Job:  [id number]

## 2010-12-06 NOTE — H&P (Signed)
[name], MONJARAS NO.:  [id number]   MEDICAL RECORD NO.:  [id number]          PATIENT TYPE:  INP   LOCATION:  1619                         FACILITY:  [facility]   PHYSICIAN:  [name], M.D.  DATE OF BIRTH:  20-Sep-1975   DATE OF ADMISSION:  10/10/2005  DATE OF DISCHARGE:                                HISTORY & PHYSICAL   CHIEF COMPLAINT:  Nausea and vomiting.   HISTORY OF PRESENT ILLNESS:  [name] is a 35 year old African-American  female with type 1 diabetes complicated by nephropathy, retinopathy, and  recurrent episodes of DKA who presented to the emergency department today  with nausea and vomiting for the past 3 days.  She states that she has had  nausea and vomiting since Tuesday with inability to keep down p.o.  No  diarrhea.  Due to lack of p.o. intake, she did not take any insulin for the  past 3 days.  Upon presentation to the emergency department, her glucose is  578 with an increase in anion gap acidosis and ketones in her blood and  urine.  She has already received 2 liters of normal saline and has been  started on a Glucomander insulin drip.  The patient has been hospitalized  several times recently for a DKA including a hospitalization in February of  2007 when she was pregnant and a follow-up hospitalization about 2 weeks ago  postpartum at [facility] for DKA and renal failure.  She states that her sugars  and kidney function were stabilized and she was discharged home.  She  reports that she actually saw Dr. Osborne Werner earlier this week and her A1C was  6.7 on Lantus and sliding scale Humalog.  She has recently had cesarean  section delivery on February 15.  No significant complications except for a  small abdominal wound which is healing with home health wound care.   REVIEW OF SYSTEMS:  All systems were reviewed with the patient and are  negative except as in the HPI with the following exceptions.  Endorses  dysuria, back pain when she lies  down, no diarrhea or cough.  All other  systems negative.   PAST MEDICAL HISTORY:  1.  Type 1 diabetes since age 72, complicated by nephropathy, retinopathy.  2.  Chronic renal insufficiency with a baseline creatinine of 2.0 followed      by [name], M.D.  3.  Hypertension.  4.  Rheumatoid arthritis on no therapy.  Denies NSAID use.  5.  Gravida 2, para 1 (therapeutic abortion x1 and cesarean section on      September 04, 2005).   MEDICATIONS:  1.  Labetalol 400 mg daily.  2.  Norvasc 10 mg daily.  3.  Lantus 26 units daily.  4.  Humalog sliding scale insulin.  5.  Lisinopril 10 mg daily.   ALLERGIES:  No known drug allergies.   FAMILY HISTORY:  Her grandmother has type 2 diabetes.  Multiple family  members with hypertension.   SOCIAL HISTORY:  She lives with her aunt and grandmother.  She has a 6-week-  old daughter that they  are helping care for.  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  Denies tobacco, alcohol, or drug  use.   PHYSICAL EXAMINATION:  VITAL SIGNS:  Temperature 99.3, blood pressure  144/93, pulse 120, respirations 20, oxygen saturation 99% on room air.  GENERAL:  Withdrawn with flat affect.  In no acute distress.  HEENT:  No scleral icterus.  Oropharynx is moist.  NECK:  Supple without lymphadenopathy, JVD, or carotid bruits.  HEART:  Tachycardic without murmurs, rubs, or gallops.  LUNGS:  Clear to auscultation bilaterally.  ABDOMEN:  Soft, nondistended, and nontender with normal active bowel sounds.  EXTREMITIES:  No cyanosis, clubbing, or edema.  SKIN:  Lower abdominal wound which is dressed with good granulation tissue  and no evidence of purulence or erythema.  NEUROLOGY:  Alert and oriented x4.  PSYCHIATRIC:  Flat affect.   LABORATORY DATA:  CBC significant for a white blood cell count of 12.9,  hemoglobin 11.4, platelets 606.  Initial BMET significant for a sodium of  129, potassium 5.0, chloride 85, bicarb 25,  BUN 55, creatinine 3.0, glucose  578.  Follow-up BMET 2 hours later showed sodium 130, potassium 4.6,  chloride 92, bicarb 20, BUN 51, creatinine 2.8, glucose 523.  Urinalysis has  too numerous to count white blood cells and red blood cells.  Ketones 40.  Protein greater than 300.   ASSESSMENT:  1.  DKA.  She has had recurrent episodes.  This episode has been triggered      by urinary tract infection resulting in nausea and vomiting and      noncompliance with her sick-day insulin regimen with no insulin therapy      for the past 3 days.  She will be admitted to a stepdown unit for      aggressive hydration, insulin drip per Glucomander protocol, and close      monitoring of her acidosis and metabolic derangements.  I did begin the      process of educating the patient about the need for insulin even on days      that she is not eating and will continue to reinforce this throughout      her hospitalization and after discharge.   1.  Acute on chronic renal failure.  This is likely prerenal azotemia      superimposed on chronic diabetic nephropathy.  We will hold her ACE      inhibitor for now.  Monitor renal function with hydration and resume      current medications as tolerated.   1.  Urinary tract infection.  Cipro dosed based on renal function.  Follow-      up urine cultures. Obtain blood cultures.  Phenergan as needed.   1.  Hypertension.  Hold antihypertensives for now and resume in the morning      if tolerating p.o. and her blood pressure is stable.   1.  Status post cesarean section.  Continue wound care.  This abdominal      wound does not appear infected or the source of her DKA.   1.  Possible depression.  She is at high risk for postpartum depression.      She appears to have a very flat affect and her mood may be contributing      to her noncompliance.  We will continue to observe with the treatment of      her acute illness and consider therapy.  1.  FEN.  Aggressive hydration.  NPO until her symptoms improve.      [name], M.D.  Electronically Signed     WS/MEDQ  D:  10/11/2005  T:  10/13/2005  Job:  [id number]   cc:   [name], M.D.  Fax: [phone]   [name], M.D.  Fax: [phone]

## 2010-12-06 NOTE — H&P (Signed)
NAMEESHANA, KOLESAR NO.:  [id number]   MEDICAL RECORD NO.:  [id number]          PATIENT TYPE:  EMS   LOCATION:  ED                           [facility]ACILITY:  [facility]   PHYSICIAN:  Ermalene Searing. Philip Aspen, M.D.DATE OF BIRTH:  03/02/1976   DATE OF ADMISSION:  01/21/2005  DATE OF DISCHARGE:                                HISTORY & PHYSICAL   CHIEF COMPLAINT:  Nausea and vomiting.   HISTORY OF PRESENT ILLNESS:  The patient is a 35 year old black female with  nausea and vomiting since last night.  Today all food and fluids were  vomited.  Currently she feels weak and lightheaded with mild nausea.  She  ran out of Lantus and NovoLog insulin two days ago and has made some  attempts to use Regular insulin as a replacement.  In the emergency room,  she was given 15 units of Regular insulin and started on IV fluids with a  Glucomander IV insulin.   PAST MEDICAL HISTORY:  Diabetes mellitus, type 1, since age 74.  She has not  had problems with diabetic ketoacidosis other than at the time of initial  diagnosis.   MEDICATIONS PRIOR TO ADMISSION:  1.  Lantus insulin 25 units subcutaneous at night.  2.  NovoLog sliding scale insulin with meals.   ALLERGIES:  No known drug allergies.   PAST SURGICAL HISTORY:  None.   FAMILY HISTORY:  Her grandmother has diabetes mellitus type 2.  There are  many close relatives with hypertension.  She does not have close relatives  with early coronary artery disease, colon cancer or breast cancer.   SOCIAL HISTORY:  She is single and lives with her grandmother.  She has one  sister.  She has no history of tobacco or alcohol abuse.  She works at [employer].   REVIEW OF SYSTEMS:  Lately she has had somewhat blurred vision and is  lightheaded. She denies recent headaches.  She denies diarrhea,  constipation, dysuria, chest pain, or depression.  She has mild anxiety.   PHYSICAL EXAMINATION:  VITAL SIGNS:  Blood pressure 126/84, pulse 109,  respirations  20, temperature 97.8, pulse oximetry 100% on room air.  GENERAL:  She is a well-nourished, well-developed black female who was in no  apparent distress.  HEENT:  Within normal limits.  NECK:  Supple without jugular venous distension or carotid bruits.  CHEST:  Clear to auscultation.  HEART:  Regular rate and rhythm (tachycardia).  S1 and S2 present with a  systolic ejection murmur, grade 1/6 at the left sternal border.  ABDOMEN:  Normal bowel sounds with no hepatosplenomegaly or tenderness.  EXTREMITIES:  Without edema.  Pedal pulses were normal and the feet were  sensate to light touch.  NEUROLOGIC:  She is alert and oriented x3 with a normal affect.  Cranial  nerves II-XII intact.  She is able to move four extremities well.   LABORATORY DATA:  White blood cell count 16.1 with 94% neutrophils.  Hemoglobin 15, hematocrit 46, platelets 353.  Serum sodium 129, potassium  5.8, chloride 93, CO2 9, BUN 22, creatinine 1.8, glucose 698.  IMPRESSION/PLAN:  Leukocytosis with nausea and vomiting.  This is most  likely secondary to diabetic ketoacidosis as evidenced by her markedly  elevated blood glucose level and markedly decreased total CO2 level.  I plan  to treat her with IV fluids including potassium and IV insulin via the  Glucomander.  We discussed the importance of always having insulin even if  she is not feeling well and she will receive further education via her  primary care physician and the certified diabetes educator at our office.       DGP/MEDQ  D:  01/22/2005  T:  01/22/2005  Job:  [id number]   cc:   Haywood Pao, M.D.  [address]  [place]  [place] [address]  Fax: [phone]

## 2010-12-06 NOTE — H&P (Signed)
[name], [name] NO.:  [id number]   MEDICAL RECORD NO.:  [id number]          PATIENT TYPE:  INP   LOCATION:  5511                         FACILITY:  [facility]   PHYSICIAN:  Haywood Pao, M.D.DATE OF BIRTH:  29-Mar-1976   DATE OF ADMISSION:  12/21/2005  DATE OF DISCHARGE:                                HISTORY & PHYSICAL   ADMISSION DIAGNOSES:  1.  Acute renal failure.  2.  Diabetic ketoacidosis.  3.  Medication noncompliance.  4.  Nausea and vomiting.   HISTORY OF PRESENT ILLNESS:  The patient is a 35 year old black female, well-  known to me from my practice at [facility] who has frequent  difficulty with continuing her insulin regimen.  She was last admitted at  the end of April for diabetic ketoacidosis related to noncompliance.  She  contacted our office earlier this week with issues of nausea and vomiting.  At that point her blood sugar was in the 400's.  She was instructed once  again per routine for her to check her blood sugars every 2 hours and give  the appropriate amount of insulin based on her sliding scale.  The patient  informed me this evening when being seen at the emergency room that she had  neglected to do so and that she last checked her blood sugar this morning  and it was in the 400's.  She indicates that she was too weak to check her  blood sugar, however, her grandmother and her newborn baby who is less than  18 months old were both at home with her.  When questioned why her  grandmother could not help her with checking her blood sugar or getting her  meter for her if she was too weak, there was no response.   Tarren has not been eating very well over the past couple of days and  indicates that she has had nausea and vomiting throughout.  She denies any  attempts of suicide or any attempts to overdose or injure herself in any  way.  She has not taken any new medications or herbal supplements.  She is  being  admitted for the aforementioned acute renal failure and diabetic  ketoacidosis.   ALLERGIES:  No known drug allergies.   MEDICATIONS:  1.  Norvasc 10 mg daily.  2.  Labetalol 100 mg b.i.d.  3.  Lantus 26 units subcu q.h.s.  4.  NovoLog sliding scale.  5.  Reglan 5 mg p.o. q.a.c.  6.  Aranesp- dosing per nephrology.   PAST MEDICAL HISTORY:  1.  Diabetes mellitus type 1 with long history of noncompliance as well as      diabetic gastroparesis.  2.  Chronic kidney disease stage 3 that is followed by Donato Heinz,      M.D. at [facility].  3.  Diabetic gastroparesis.  4.  Frequent UTI's.  5.  Iron deficiency anemia/anemia of chronic disease.  6.  Hypertension.   PAST SURGICAL HISTORY:  None.   SOCIAL HISTORY:  The patient lives in [place] with her extended family of  her aunt and  her grandmother as well as her newborn baby.  She previously  worked in Therapist, art.  She is a nonsmoker, nondrinker, and is not  married.  She has been pregnant a total of two times.   FAMILY HISTORY:  Positive for hypertension and type 2 diabetes in her  grandmother.   REVIEW OF SYSTEMS:  The patient complains of chills, but denies checking her  temperature.  Notes a slight headache and feels somewhat dry mouth.  Denies  any sore throat, feels generally weak and indicates that her urine output  has been slower over the course of the weekend and that she is having some  burning with urination as well as some nausea and vomiting, but without  abdominal pain.  Review of systems is otherwise negative.  Advanced  directives; the patient is a full code.   PHYSICAL EXAMINATION:  VITAL SIGNS:  Temperature 97.8, pulse 97, respiratory  rate 18, blood pressure 102/76, saturating 99% on room air.  GENERAL:  The patient is somewhat ill-appearing in her usual manner.  She  answers questions with very short replies.  HEENT:  Normocephalic and atraumatic.  PERRLA.  EOMI.  ENT is within normal   limits.  NECK:  Supple, no lymphadenopathy, JVD, or bruit.  HEART:  Regular rate and rhythm.  No murmurs, rubs, or gallops.  LUNGS:  Clear to auscultation bilaterally.  ABDOMEN:  Soft and nontender.  The patient has increased activity of bowel  sounds, but no rebound or guarding.  No splenomegaly was appreciated.  No  CVA tenderness noted.  NEUROLOGY:  The patient is oriented x3.  There are 1+ deep tendon reflexes  and downgoing toes with no visible neurologic movement deficits.  MUSCULOSKELETAL:  The patient indicates that she is unable to stand, but has  a firm grip and is able to lift her legs off of the gurney.   LABORATORY DATA:  White count 10.3, hemoglobin 12.1, hematocrit 36.6,  platelets 596.  Sodium decreased to 125, potassium elevated at 6, glucose at  469 which gives her a relative potassium deficit.  BUN 160, creatinine 15.6,  normal baseline is approximately 2.0.  Chloride decreased to 69, bicarb 29.  Urinalysis shows specific gravity of 1.014, pH 6, positive for leukocytes  and nitrites as well as red and white blood cells, scant red blood cells in  the urine under microscopy.   ASSESSMENT:  1.  Acute renal failure.  I am not certain of the immediate cause.  Some of      this may be prerenal related to nausea and vomiting, but I feel that her      nausea and vomiting may in fact be symptoms of her acute renal failure      rather than vice versa.  There is no evidence of any inciting event.  We      will do a urine toxicology screen and a Tylenol level to see if she may      have tried to injure herself in some way although she denies this.  She      does not drink moonshine and denies any nonsteroidal usage recently      which would complicate things further.  We will place a Foley to monitor      her urine output very closely as well as doing a renal ultrasound to     look for any source of reversible obstruction.  My immediate thought is      with her extreme  elevation  in BUN and creatinine that she may require      dialysis.  I have contacted Caren Griffins B. Lorrene Reid, M.D. with [facility]      who is on call for them this evening and we discussed transferring      Vanessia over to [facility] where it is very possible that she      may require hemodialysis and try to get her a bed on 5500 if at all      possible, which is the renal floor.  The patient is normally followed by      Donato Heinz, M.D.  She indicates that she has an appointment again      in July and was seen last month.  We will use most recent creatinine as      the baseline, however, from my recollection this is about 2.  2.  Diabetic ketoacidosis.  The patient has been initiated on the      Glucomander protocol per the emergency room.  We will continue this      until her CBG's are down to 150 and then switch her to D5.  She may      require potassium replacement as well as her blood sugars come down.      Once again draw labs in the next 6 hours which will actually be the      morning draw and consider adding potassium as necessary, however, given      her extreme renal insufficiency, we will be very gentle at doing this.  3.  Hyponatremia.  IV fluids of normal saline as above.  4.  Iron deficiency anemia.  If in fact she is as dry as she is, she may      have a more relatively lower anemia.  Her MCV is slightly low as well.      She may require potassium replacement and consideration of Aranesp or      Procrit if in fact her renal function does not improve.  I am uncertain      as to whether she has been started on this although her most recent      nephrology note references this.  5.  Hypertension.  She is currently somewhat hypotensive.  We will hold her      blood pressure medications for now and reassess in the morning.  If her      blood pressure is elevated, we will reinitiate Norvasc and/or labetalol.  6.  Depression.  I have discussed this with Elsye in the past  and she has      been very hesitant to go on antidepressant-type medications.  I am      concerned not only for [facility], but also for her newborn child who her      extended family is currently taking care of.  My concern is that      Dustie is clearly showing a pattern of self-neglect with several other      admissions for diabetic ketoacidosis over the course of this year.  In      total discussions with her, we have written out plans for what she is to      do on her sick days as far as monitoring her blood sugars more      frequently, calling the office if she is not in understanding of this,      however, she is not even monitoring her blood sugars but once  a day at      this point and until she starts being compliant with this, there is very      little else that we can do to keep her from repeating these episodes of     DKA.  Unfortunately as her extended family has to take care of the baby,      they are usually not present at her hospitalizations.  I talked to      [place] about maybe setting up a conference with her family so that      they can perhaps urge her to take better care of herself and therefore      be able to better take care of her newborn child.      Haywood Pao, M.D.  Electronically Signed     RWT/MEDQ  D:  12/21/2005  T:  12/22/2005  Job:  [id number]

## 2010-12-06 NOTE — Discharge Summary (Signed)
[name], [name] NO.:  [id number]   MEDICAL RECORD NO.:  [id number]          PATIENT TYPE:  INP   LOCATION:  1428                         FACILITY:  [facility]   PHYSICIAN:  Haywood Pao, M.D.DATE OF BIRTH:  July 17, 1976   DATE OF ADMISSION:  11/14/2005  DATE OF DISCHARGE:  11/17/2005                                 DISCHARGE SUMMARY   DIAGNOSES:  1.  Diabetic ketoacidosis secondary to testing noncompliance.  2.  Type 1 diabetes mellitus.  3.  Hypertension.  4.  Gastroparesis secondary to diabetes.  5.  Urinary tract infection.  6.  Chronic kidney disease stage 3 followed by nephrology.   DISCHARGE MEDICATIONS:  1.  Lantus insulin 26 units subcu q.h.s.  2.  Humalog sliding scale.  The patient is to test a.c. and h.s., take      sliding scale as follows 101-150, 2 units; 151-200, 4 units; 201-250, 6      units; 251-300, 8 units; 301, 9 units; and test again in three hours.      The patient must check a.c. and h.s.  3.  Norvasc 10 mg once daily.  4.  Reglan 5 mg t.i.d. with meals.  5.  Cipro 250 mg p.o. daily to complete through Nov 24, 2005.  6.  Labetalol 100 mg p.o. b.i.d.   LABORATORY DATA:  Sodium slightly decreased this morning at 131, potassium  3.8, fluoride 99, bicarbonate 24 with no anion gap, creatinine 2.0 which is  chronic for her.  Hemoglobin 11, hematocrit 33.2, platelets 374,000.   DISCHARGE PHYSICAL EXAMINATION:  VITAL SIGNS:  Blood pressure 119/83, heart  rate slightly elevated at 102, respirations 16, saturating 99% on room air,  temperature 99.0.  GENERAL APPEARANCE:  No acute distress.  HEENT:  Normocephalic, atraumatic.  PERRLA.  EOMI.  ENT within normal  limits.  NECK:  Supple.  No lymphadenopathy, JVD or bruits.  HEART:  Regular rate and rhythm.  No murmurs, rubs or gallops.  ABDOMEN:  Soft, nontender, normoactive bowel sounds.  EXTREMITIES:  No cyanosis, clubbing or edema.   HOSPITAL COURSE:  [name] was admitted to  the hospital on November 14, 2005, with diabetic ketoacidosis.  She was admitted by my partner Dr. Dagmar Hait  and followed through the weekend by him in my absence.  In speaking with the  patient on November 17, 2005, it was noted that the patient had stopped testing  her blood sugars the previous Monday.  She could not give a clear reason for  this indicating that she is somewhat running low on strips but not  completely out, indicating that she had received an iron shot, presumably  through nephrology but had not tested her blood sugars despite not feeling  well for the entire week.  Was subsequently admitted, put on Glucomander  protocol, given fluids and her gap resolved.  The patient was also noticed  as being more hypertensive than usual, and Labetalol was started.  I prefer  to use an ACE inhibitor.  However, given her long history of noncompliance  as well as her not informing me of  her pregnancy in which she delivered last  February, I am hesitant to use ACE inhibitor ARB as it may be harmful if she  becomes pregnant again, as she does not have any form of permanent or  temporary birth control that she is using.  The patient was switched over to  Lantus insulin, and blood sugars were somewhat high morning of discharge due  to her Lantus insulin dose being low.  However, there was a box of Rice  Krispies and a [employer] in her room which she had eaten completely,  indicating that she has returned to her normal diet.   We talked in detail about the need for her to test and the fact that she  does have insurance and needs to use her insurance in order to be provided  with test strips, and she has advantages that many other people don't with  regards to her diabetes and need for her medications.  The patient seemed to  be terribly not concerned with this and was mostly concerned with going home  and taking care of her baby because the baby's father needed to work today.  Her nonchalant  attitude is consistent with prior as well.  We discussed need  for continued compliance in order to avoid her kidney function worsening and  ending up on dialysis as well.  She did not seem to be terribly concerned  with this once again and was discharged in good condition on November 17, 2005.   FOLLOWUP:  The patient will maintain normal follow up.  Will get her in with  our diabetic educator in the next couple of weeks to go over sick day  planning once again.  I believe that she knows what she is able to do but  just chooses not to.   CONDITION ON DISCHARGE:  Stable.      Haywood Pao, M.D.  Electronically Signed     RWT/MEDQ  D:  11/17/2005  T:  11/17/2005  Job:  [id number]   cc:   Donato Heinz, M.D.  Fax: [phone]   Joyice Faster. Rolla Flatten., M.D.  Fax: [phone]

## 2010-12-06 NOTE — Discharge Summary (Signed)
NAMESHAKTI, KETCHAM             ACCOUNT NO.:  [id number]   MEDICAL RECORD NO.:  [id number]          PATIENT TYPE:  INP   LOCATION:  1309                         FACILITY:  [facility]   PHYSICIAN:  Hind I Elsaid, MD      DATE OF BIRTH:  [birth year]   DATE OF ADMISSION:  06/02/2008  DATE OF DISCHARGE:  06/04/2008                               DISCHARGE SUMMARY   DISCHARGE DIAGNOSES:  1. Hyperosmolar hyperglycemia.  2. Uncontrolled insulin-dependent diabetes mellitus with diabetic      nephropathy.  3. Chronic renal failure.  4. Hypertension.  5. Noncompliance.  6. History of hypercholesterolemia.  7. Dehydration.  8. Anemia.   DISCHARGE MEDICATIONS:  1. Insulin Lantus 10 units subcutaneous twice daily.  2. NovoLog sliding scale.  3. Norvasc 10 mg daily.  4. Zocor 20 mg at bedtime.   FOLLOWUP:  Patient needs to follow up with Dr. Marval Regal next week to  check her kidney function and heart, hemoglobin.   HISTORY OF PRESENT ILLNESS:  1. This is a 35 year old female with history of insulin-dependent      diabetes mellitus admitted to the hospital with dehydration,      uncontrolled diabetes mellitus and hypertension.  Patient was      started on insulin drip which gradually titrated to insulin      subcutaneous with a sliding scale.  2. Acute on chronic kidney disease, responded to IV fluid with      improvement on her kidney function.  Patient has a baseline      elevated creatinine secondary to uncontrolled diabetes mellitus.      Patient was asked to follow up with Dr. Marval Regal for further      recommendation for her kidney problem.  Patient may benefit from      dialysis in the near future.  3. Hyperlipidemia.  We will continue with Zocor.  At this time, it was      felt that patient is stable to be discharged.  Social worker      contacted for medication financial aid.      Hind Franco Collet, MD  Electronically Signed     HIE/MEDQ  D:  09/20/2008  T:  09/20/2008  Job:   [id number]

## 2010-12-06 NOTE — Consult Note (Signed)
NAMESHAYNE, LANDSVERK NO.:  [id number]   MEDICAL RECORD NO.:  [id number]          PATIENT TYPE:  EMS   LOCATION:  MAJO                         FACILITY:  [facility]   PHYSICIAN:  Annita Brod, M.D.DATE OF BIRTH:  December 27, 1975   DATE OF CONSULTATION:  04/07/2006  DATE OF DISCHARGE:                                   CONSULTATION   PCP:  She does not have a PCP, but she is going to be established with Dr.  Girard Cooter, MD.  She also follows regularly with Dr. Marval Regal, Nephrology.   HOSPITAL COURSE:  Patient is a 35 year old white female with past medical  history of diabetes mellitus, poorly controlled, as well as hypertension and  chronic renal failure who has been, for the past 2 days, has had two  episodes of hypoglycemia.  She tells me the sugars have normally been quite  high, but she has been inconsistent where she has been delayed in getting  her Lantus, she takes 26 at night and took it much latter but then tried to  make up for it by taking her other dose earlier.  In addition, that is how  she had her first episode of hypoglycemia.  In addition, she has not been  eating as much and tried to make up for it with her sliding scale, which led  to taking extra NovoLog.  This caused her second episode of hypoglycemia.  Both times patient came in with low blood sugars to the Emergency Room.  The  first time she was sent home.  The second time, because this was a repeat  event, the ER wanted to admit the patient.  However, this was also around  5:30 to 6 in the morning of April 07, 2006, before the offices were  opened.  Once [facility] hospitalist followed up and saw the patient, we decided  we better, given that it was close to 8:30 to be able to be a documented  reason for the patient's hypoglycemia.  She had no other abnormalities and  she was already seeing a nephrologist.  It would be better to acutely give  her some IV fluids and get her established with an  outpatient PCP.  Currently patient is doing okay.  She denies any headaches, vision changes,  dysphagia, chest pain, palpitations, shortness of breath, wheeze, cough,  abdominal pain, hematuria, dysuria, constipation, diarrhea, focal extremity  numbness, weakness or pain.   REVIEW OF SYSTEMS:  Otherwise negative.   PAST MEDICAL HISTORY:  1. Diabetes mellitus.  2. Acute renal failure.  3. Previous history of hyperkalemia.  4. Hypertension.   MEDICATIONS:  1. Norvasc 10.  2. ACE inhibitor.  3. Labetalol.  4. A diuretic.  5. Lantus 26 units q.h.s. plus NovoLog sliding scale.   She tells me she takes the diuretic because she had episodes of hyperkalemia  in the past and was put on meds to keep her potassium down.   SHE HAS NO KNOWN DRUG ALLERGIES.   SOCIAL HISTORY:  She denies any tobacco, alcohol or drug use.   FAMILY HISTORY:  Noncontributory.   PHYSICAL  EXAM:  VITALS ON ADMISSION:  Initially, her blood pressure was  quite high when she came in at 129/109 but a low blood sugar.  Since then it  has come down to 128/75, heart rate initially 122 down to 107, she is  afebrile, 100% on room air, respirations 28.  GENERAL:  The patient is alert and oriented x3, in no apparent distress.  HEENT:  Normocephalic, atraumatic.  Mucous membranes are slightly dry.  She  has no carotid bruits.  HEART:  Regular rate and rhythm, S1, S2.  LUNGS:  Clear to auscultation bilaterally.  ABDOMEN:  Soft, nontender, nondistended, positive bowel sounds.  EXTREMITIES:  Show no clubbing, cyanosis or edema.   LAB WORK:  White count 7.9, H&H 10.3, 29.7, MCV of 81, platelet count 450,  84% neutrophils, sodium 140, potassium 4.2, chloride is 108, bicarbonate is  18.  BUN is 40, creatinine 3.9, glucose 145.  LFTs are noted, are  unremarkable except for a total protein of 8.4.   ASSESSMENT AND PLAN:  Episodes of hypoglycemia.  This is likely secondary to  poor compliance and she needs better understanding  of her Lantus.  We had an  extensive discussion and I think to simplify matters she clearly needs to be  established with a doctor who can help manage her blood sugars.  Dr.  Marval Regal, while an excellent nephrologist, is also helping with her blood  pressure and he could benefit from some assistance.  We have already spoken  to Dr. Vivien Rota office, of [place] at [facility], and they do have an  opening and can see the patient tomorrow, April 08, 2006, at 10:45 a.m.,  appointment has been made.  Patient has been given contact information and  the appointment.  In addition, to simplify matters, will hold the patient's  sliding scale for now.  Will also change her Lantus from 26 once a day to 13  twice a day.  I have spoken to the patient, told her it is best if she tries  to take it about every 12 hours.  Patient also tells me that she has lost  her Glucometer, so we will give her a prescription for the exact same repeat  Glucometer.  In regard to patient's renal failure, it appears to be slightly  acute on chronic with somewhat of a prerenal dehydration component.  I am  giving the patient IV fluids times 1 liter now.  I also plan to hold her  Lasix all together.  I think that she has no evidence of history of  congestive heart failure and it may be better if she stops that for now.  In  regards to her hypertension, would continue all of her other medications  except for her diuretic.  Also, patient is being planned to be followed by  Vascular Surgery for mapping of her arm for a possible arteriovenous fistula  for eventual dialysis, but for now will defer.      Annita Brod, M.D.  Electronically Signed     SKK/MEDQ  D:  04/07/2006  T:  04/07/2006  Job:  [id number]   cc:   Girard Cooter, MD  Donato Heinz, M.D.

## 2011-04-02 ENCOUNTER — Other Ambulatory Visit (HOSPITAL_COMMUNITY): Payer: Self-pay | Admitting: Nephrology

## 2011-04-02 DIAGNOSIS — N186 End stage renal disease: Secondary | ICD-10-CM

## 2011-04-07 ENCOUNTER — Ambulatory Visit (HOSPITAL_COMMUNITY)
Admission: RE | Admit: 2011-04-07 | Discharge: 2011-04-07 | Disposition: A | Discharge status: Home / Self Care | Payer: Medicare Other | Source: Ambulatory Visit | Attending: Nephrology | Admitting: Nephrology

## 2011-04-07 ENCOUNTER — Other Ambulatory Visit (HOSPITAL_COMMUNITY): Payer: Self-pay | Admitting: Nephrology

## 2011-04-07 DIAGNOSIS — Y832 Surgical operation with anastomosis, bypass or graft as the cause of abnormal reaction of the patient, or of later complication, without mention of misadventure at the time of the procedure: Secondary | ICD-10-CM | POA: Insufficient documentation

## 2011-04-07 DIAGNOSIS — N186 End stage renal disease: Secondary | ICD-10-CM

## 2011-04-07 DIAGNOSIS — T82898A Other specified complication of vascular prosthetic devices, implants and grafts, initial encounter: Secondary | ICD-10-CM | POA: Insufficient documentation

## 2011-04-07 MED ORDER — IOHEXOL 300 MG/ML  SOLN
150.0000 mL | Freq: Once | INTRAMUSCULAR | Status: AC | PRN
  Administered 2011-04-07: 70 mL via INTRAVENOUS

## 2011-04-11 ENCOUNTER — Other Ambulatory Visit: Payer: Self-pay

## 2011-04-11 DIAGNOSIS — Z01810 Encounter for preprocedural cardiovascular examination: Secondary | ICD-10-CM

## 2011-04-11 DIAGNOSIS — N186 End stage renal disease: Secondary | ICD-10-CM

## 2011-04-14 LAB — CBC
HCT: 33.3 — ABNORMAL LOW
MCV: 85.9
RBC: 3.88
WBC: 4.7

## 2011-04-15 ENCOUNTER — Encounter: Payer: Self-pay | Admitting: Vascular Surgery

## 2011-04-15 LAB — IRON AND TIBC
Iron: 93
TIBC: 251
UIBC: 158

## 2011-04-15 LAB — RENAL FUNCTION PANEL
BUN: 29 — ABNORMAL HIGH
Chloride: 108
Glucose, Bld: 34 — CL
Potassium: 4.6

## 2011-04-17 ENCOUNTER — Encounter: Payer: Self-pay | Admitting: Vascular Surgery

## 2011-04-18 ENCOUNTER — Ambulatory Visit (INDEPENDENT_AMBULATORY_CARE_PROVIDER_SITE_OTHER): Payer: Medicare Other | Admitting: Vascular Surgery

## 2011-04-18 ENCOUNTER — Encounter: Payer: Self-pay | Admitting: Vascular Surgery

## 2011-04-18 VITALS — BP 148/81 | HR 93 | Resp 20 | Ht 62.0 in | Wt 121.0 lb

## 2011-04-18 DIAGNOSIS — Z01810 Encounter for preprocedural cardiovascular examination: Secondary | ICD-10-CM

## 2011-04-18 DIAGNOSIS — N186 End stage renal disease: Secondary | ICD-10-CM

## 2011-04-18 NOTE — Progress Notes (Signed)
[facility] [place]  Referred by: Dr. Justin Mend   Reason for referral: New access  History of Present Illness  [name] is a 35 y.o. female who presents for evaluation for permanent access.  The patient is right hand dominant.  The patient's previous access procedure(s): L UA AVG.  The patient has had multiple PTA of the LUA AVG.  Last one required recannulation of central venous system and was resistant to angioplasty.  Past Medical History  Diagnosis Date  . Chronic kidney disease   . Diabetes mellitus   . Hypertension   . Hyperlipidemia   . Anemia     Past Surgical History  Procedure Date  . Arteriovenous graft placement   . Cesarean section     History   Social History  . Marital Status: Single    Spouse Name: N/A    Number of Children: N/A  . Years of Education: N/A   Occupational History  . Not on file.   Social History Main Topics  . Smoking status: Never Smoker   . Smokeless tobacco: Not on file  . Alcohol Use: No  . Drug Use: No  . Sexually Active: Not on file   Other Topics Concern  . Not on file   Social History Narrative  . No narrative on file    Family History  Problem Relation Age of Onset  . Cancer Mother     breast  . Hypertension Father     Current Outpatient Prescriptions on File Prior to Visit  Medication Sig Dispense Refill  . amLODipine (NORVASC) 10 MG tablet Take 10 mg by mouth daily.        Marland Kitchen aspirin 81 MG tablet Take 81 mg by mouth daily.        . calcium acetate (PHOSLO) 667 MG capsule Take 667 mg by mouth 3 (three) times daily with meals.        . hydrOXYzine (ATARAX/VISTARIL) 25 MG tablet Take 25 mg by mouth 3 (three) times daily as needed.        . insulin aspart (NOVOLOG) 100 UNIT/ML injection Inject into the skin. Take 10-15 units per sliding scale three times / day before meals       . insulin glargine (LANTUS) 100 UNIT/ML injection Inject 20 Units into the skin 2 (two) times daily.       Marland Kitchen  labetalol (NORMODYNE) 200 MG tablet Take 200 mg by mouth 2 (two) times daily.        . medroxyPROGESTERone (DEPO-PROVERA) 150 MG/ML injection Inject 150 mg into the muscle every 3 (three) months.        . rosuvastatin (CRESTOR) 10 MG tablet Take 10 mg by mouth daily.          Allergies as of 04/18/2011  . (No Known Allergies)    Review of Systems (Positive items in bold and italic, otherwise negative)  General: Weight loss, Weight gain, Loss of appetite, Fever  Neurologic: Dizziness, Blackouts, Headaches, Seizure  Ear/Nose/Throat: Change in eyesight, Change in hearing, Nose bleeds, Sore throat  Vascular: Pain in legs with walking, Pain in feet while lying flat, Non-healing ulcer, Stroke, "Mini stroke", Slurred speech, Temporary blindness, Blood clot in vein, Phlebitis  Pulmonary: Home oxygen, Productive cough, Bronchitis, Coughing up blood, Asthma, Wheezing  Musculoskeletal: Arthritis, Joint pain, Muscle pain  Cardiac: Chest pain, Chest tightness/pressure, Shortness of breath when lying flat, Shortness of breath with exertion, Palpitations, Heart murmur, Arrythmia, Atrial fibrillation  Hematologic: Bleeding  problems, Clotting disorder, Anemia  Psychiatric:  Depression, Anxiety, Attention deficit disorder  Gastrointestinal:  Black stool, Blood in stool, Peptic ulcer disease, Reflux, Hiatal hernia, Trouble swallowing, Diarrhea, Constipation  Urinary:  Kidney disease, Burning with urination, Frequent urination, Difficulty urinating  Skin: Ulcers, Rashes  Physical Examination  Filed Vitals:   04/18/11 1656  BP: 148/81  Pulse: 93  Resp: 20  Height: 5\' 2"  (1.575 m)  Weight: 121 lb (54.885 kg)    General: A&O x 3, WDWN  Head: [place]/AT  Ear/Nose/Throat: Hearing grossly intact, nares w/o erythema or drainage, oropharynx w/o Erythema/Exudate  Eyes: PERRLA, EOMI  Neck: Supple, no nuchal rigidity, no palpable LAD  Pulmonary: Sym exp, good air movt, CTAB, no rales, rhonchi, &  wheezing  Cardiac: RRR, Nl S1, S2, no Murmurs, rubs or gallops  Vascular: Vessel Right Left  Radial Palpable Palpable  Brachial Palpable Palpable  Carotid Palpable, without bruit Palpable, without bruit  Aorta Non-palpable N/A  Femoral Palpable Palpable  Popliteal Non-palpable Non-palpable  PT Palpable Palpable  DP Palpable Palpable   Gastrointestinal: soft, NTND, -G/R, - HSM, - masses, - CVAT B  Musculoskeletal: M/S 5/5 throughout , Extremities without ischemic changes, LUA AVG w/ pulsatile character  Neurologic: CN 2-12 intact , Pain and light touch intact in extremities , Motor exam as listed above  Psychiatric: Judgment intact, Mood & affect appropriate for pt's clinical situation  Dermatologic: See M/S exam for extremity exam, no rashes otherwise noted  Lymph : No Cervical, Axillary, or Inguinal lymphadenopathy   Non-Invasive Vascular Imaging  Vein Mapping  (Date: 04/18/11):   R arm: acceptable vein conduits include none  L arm: not done  Outside Studies/Documentation 10 pages of outside documents were reviewed including: dialysis center notes.  Medical Decision Making  [name] is a 35 y.o. female who presents with ESRD requiring hemodialysis.   Based on vein mapping and examination, this patient's permanent access options include: R FA AVG vs RUA AVG  In regards to tertiary procedures for the left UA AVG, consideration for a HeRO graft cath and hybrid Gore AVG maybe an option.  I have little experience with either so I would have Dr. Donnetta Hutching review the films on Monday to consider such.  I had an extensive discussion with this patient in regards to the nature of access surgery, including risk, benefits, and alternatives.    The patient is aware that the risks of access surgery include but are not limited to: bleeding, infection, steal syndrome, nerve damage, ischemic monomelic neuropathy, failure of access to mature, and possible need for additional  access procedures in the future.  Adele Barthel, MD [facility] [place] Office: [phone] Pager: [phone]

## 2011-04-23 LAB — BASIC METABOLIC PANEL
BUN: 37 — ABNORMAL HIGH
CO2: 22
Calcium: 7.7 — ABNORMAL LOW
Chloride: 108
Creatinine, Ser: 3.51 — ABNORMAL HIGH
GFR calc Af Amer: 19 — ABNORMAL LOW
GFR calc non Af Amer: 11 — ABNORMAL LOW
Glucose, Bld: 92
Potassium: 3.5
Sodium: 140

## 2011-04-23 LAB — COMPREHENSIVE METABOLIC PANEL
ALT: 24
AST: 35
Alkaline Phosphatase: 164 — ABNORMAL HIGH
CO2: 20
Calcium: 10.1
GFR calc Af Amer: 12 — ABNORMAL LOW
Glucose, Bld: 296 — ABNORMAL HIGH
Potassium: 4
Sodium: 135
Total Protein: 8.7 — ABNORMAL HIGH

## 2011-04-23 LAB — GLUCOSE, CAPILLARY
Glucose-Capillary: 114 — ABNORMAL HIGH
Glucose-Capillary: 146 — ABNORMAL HIGH
Glucose-Capillary: 147 — ABNORMAL HIGH
Glucose-Capillary: 270 — ABNORMAL HIGH
Glucose-Capillary: 313 — ABNORMAL HIGH
Glucose-Capillary: 412 — ABNORMAL HIGH
Glucose-Capillary: 526
Glucose-Capillary: 95

## 2011-04-23 LAB — POCT I-STAT, CHEM 8
BUN: 57 — ABNORMAL HIGH
Calcium, Ion: 0.99 — ABNORMAL LOW
Chloride: 108
Creatinine, Ser: 5.4 — ABNORMAL HIGH
Glucose, Bld: 296 — ABNORMAL HIGH
TCO2: 19

## 2011-04-23 LAB — URINALYSIS, ROUTINE W REFLEX MICROSCOPIC
Glucose, UA: >1000 — AB
Leukocytes, UA: NEGATIVE
Nitrite: NEGATIVE
Protein, ur: >300 — AB
Specific Gravity, Urine: 1.022
pH: 5.5

## 2011-04-23 LAB — DIFFERENTIAL
Basophils Relative: 0
Eosinophils Absolute: 0
Eosinophils Relative: 0
Lymphs Abs: 1.5
Monocytes Relative: 5
Neutrophils Relative %: 77

## 2011-04-23 LAB — CBC
Hemoglobin: 14.1
MCHC: 33.4
MCHC: 34.7
MCV: 83.2
Platelets: 186
RBC: 3.29 — ABNORMAL LOW
RBC: 5.06
RDW: 15.4
WBC: 5.2

## 2011-04-23 LAB — GLUCOSE, RANDOM: Glucose, Bld: 663

## 2011-04-23 LAB — HEMOGLOBIN A1C
Hgb A1c MFr Bld: 15.4 — ABNORMAL HIGH
Mean Plasma Glucose: 395

## 2011-04-23 LAB — OCCULT BLOOD X 1 CARD TO LAB, STOOL: Fecal Occult Bld: NEGATIVE

## 2011-04-23 LAB — POCT PREGNANCY, URINE: Preg Test, Ur: NEGATIVE

## 2011-04-28 NOTE — Procedures (Unsigned)
CEPHALIC VEIN MAPPING  INDICATION:  End-stage renal disease.  HISTORY:  EXAM: The right cephalic vein is not compressible.  Diameter measurements range from 0.08 to 0.20 cm.  The right basilic vein is compressible.  Diameter measurements range from 0.21 to 0.25 cm.  The left cephalic vein is not evaluated.  The left basilic vein is compressible.  Diameter measurements range from 0.39 to 0.54 cm.  See attached worksheet for all measurements.  IMPRESSION: 1.  Patent right basilic vein with diameter measurements as described     above. 2.  Partial thrombus with small vessel caliber present involving the     right cephalic vein. 3.  The left basilic vein is patent with diameter measurements as     described above. 4.  The left cephalic vein is not evaluated due to previous surgical     intervention.  ___________________________________________ Conrad Halfway House, MD  SH/MEDQ  D:  04/18/2011  T:  04/18/2011  Job:  [id number]

## 2011-05-05 LAB — FERRITIN: Ferritin: 146 (ref 10–291)

## 2011-05-05 LAB — CBC
MCV: 80.9
Platelets: 157
RBC: 6.42 — ABNORMAL HIGH
WBC: 4.9

## 2011-05-05 LAB — IRON AND TIBC
Iron: 54
Saturation Ratios: 21
TIBC: 261
UIBC: 207

## 2011-05-07 LAB — RENAL FUNCTION PANEL
Albumin: 3.4 — ABNORMAL LOW
Calcium: 9
GFR calc Af Amer: 21 — ABNORMAL LOW
Glucose, Bld: 304 — ABNORMAL HIGH
Phosphorus: 4
Potassium: 4.5
Sodium: 137

## 2011-05-07 LAB — CBC
MCHC: 32.8
Platelets: 251
RDW: 16.3 — ABNORMAL HIGH

## 2012-05-11 DIAGNOSIS — F419 Anxiety disorder, unspecified: Secondary | ICD-10-CM | POA: Insufficient documentation

## 2012-12-08 ENCOUNTER — Encounter: Payer: Self-pay | Admitting: Vascular Surgery

## 2012-12-08 ENCOUNTER — Other Ambulatory Visit: Payer: Self-pay

## 2012-12-08 ENCOUNTER — Encounter (INDEPENDENT_AMBULATORY_CARE_PROVIDER_SITE_OTHER): Payer: Medicare Other | Admitting: *Deleted

## 2012-12-08 ENCOUNTER — Ambulatory Visit (INDEPENDENT_AMBULATORY_CARE_PROVIDER_SITE_OTHER): Payer: Medicare Other | Admitting: Vascular Surgery

## 2012-12-08 VITALS — BP 147/83 | HR 92 | Ht 62.0 in | Wt 121.7 lb

## 2012-12-08 DIAGNOSIS — T82898A Other specified complication of vascular prosthetic devices, implants and grafts, initial encounter: Secondary | ICD-10-CM | POA: Insufficient documentation

## 2012-12-08 DIAGNOSIS — N186 End stage renal disease: Secondary | ICD-10-CM

## 2012-12-08 DIAGNOSIS — T82598A Other mechanical complication of other cardiac and vascular devices and implants, initial encounter: Secondary | ICD-10-CM

## 2012-12-08 NOTE — Progress Notes (Signed)
[facility] [place]  Patient name: [name] MRN: [id number] DOB: [birth year] Sex: female  REASON FOR VISIT: pulsatile mass in left upper arm.  HPI: [name] is a 37 y.o. female who has a left upper arm loop AV graft. Recently he was noted that she had a separate pulsatile mass that did not appear to be connected to her AV graft in her left upper arm. This is asymptomatic. She states that this was just noticed recently. It does not cause any pain. She has not had any fever. She does not remember any specific injury in this area.  Past Medical History  Diagnosis Date  . Chronic kidney disease   . Diabetes mellitus   . Hypertension   . Hyperlipidemia   . Anemia    Family History  Problem Relation Age of Onset  . Cancer Mother     breast  . Hypertension Father    SOCIAL HISTORY: History  Substance Use Topics  . Smoking status: Never Smoker   . Smokeless tobacco: Never Used  . Alcohol Use: No   No Known Allergies  Current Outpatient Prescriptions  Medication Sig Dispense Refill  . amLODipine (NORVASC) 10 MG tablet Take 10 mg by mouth daily.        Marland Kitchen aspirin 81 MG tablet Take 81 mg by mouth daily.        . calcium acetate (PHOSLO) 667 MG capsule Take 667 mg by mouth 3 (three) times daily with meals.        . insulin glargine (LANTUS) 100 UNIT/ML injection Inject 20 Units into the skin 2 (two) times daily.       . Insulin Lispro, Human, (HUMALOG PEN [place]) Inject into the skin. Sliding scale      . labetalol (NORMODYNE) 200 MG tablet Take 200 mg by mouth 2 (two) times daily.        . medroxyPROGESTERone (DEPO-PROVERA) 150 MG/ML injection Inject 150 mg into the muscle every 3 (three) months.        . rosuvastatin (CRESTOR) 10 MG tablet Take 10 mg by mouth daily.        . hydrOXYzine (ATARAX/VISTARIL) 25 MG tablet Take 25 mg by mouth 3 (three) times daily as needed.       . insulin aspart (NOVOLOG) 100 UNIT/ML injection Inject into the skin. Take  10-15 units per sliding scale three times / day before meals        No current facility-administered medications for this visit.   REVIEW OF SYSTEMS: Valu.Nieves ] denotes positive finding; [  ] denotes negative finding  CARDIOVASCULAR:  [ ]  chest pain   [ ]  chest pressure   [ ]  palpitations     [ ]  dyspnea on exertion   PULMONARY:   [ ]  productive cough   [ ]  asthma   [ ]  wheezing NEUROLOGIC:   [ ]  weakness  [ ]  paresthesias  [ ]  aphasia  [ ]  amaurosis  [ ]  dizziness HEMATOLOGIC:   [ ]  bleeding problems   [ ]  clotting disorders PSYCHIATRIC:  [ ]  history of major depression INTEGUMENTARY:  [ ]  rashes  [ ]  ulcers CONSTITUTIONAL:  [ ]  fever   [ ]  chills  PHYSICAL EXAM: Filed Vitals:   12/08/12 1132  BP: 147/83  Pulse: 92  Height: 5\' 2"  (1.575 m)  Weight: 121 lb 11.2 oz (55.203 kg)  SpO2: 100%   Body mass index is 22.25 kg/(m^2). GENERAL: The patient is a  well-nourished female, in no acute distress. The vital signs are documented above. CARDIOVASCULAR: There is a regular rate and rhythm.  PULMONARY: There is good air exchange bilaterally without wheezing or rales. In the left upper extremity there is a mass in the medial aspect of the arm which has a thrill and bruit. It is not tender. NEUROLOGIC: No focal weakness or paresthesias are detected. SKIN: There are no ulcers or rashes noted. PSYCHIATRIC: The patient has a normal affect.  DATA:  Duplex scan shows what appears to be potentially an AV fistula related to the brachial artery.  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 I've explained that I would not recommend simply observing this given the risk for continued enlargement. We also discussed the option of proceeding with an arteriogram to further evaluate this but regardless I think it needs to be explored given that it is fairly easy to get at. I feel comfortable exploring this without further  workup. I've explained that we will simply dissected out the artery and vein and identify the problem and then address this locally. We have discussed the procedure potential risks and she is agreeable to proceed. This is scheduled for 12/10/2012.  [place] [facility] [place] Beeper: [phone]

## 2012-12-09 ENCOUNTER — Ambulatory Visit: Payer: Medicare Other | Admitting: Vascular Surgery

## 2012-12-09 ENCOUNTER — Encounter (HOSPITAL_COMMUNITY): Payer: Self-pay | Admitting: *Deleted

## 2012-12-09 MED ORDER — DEXTROSE 5 % IV SOLN
1.5000 g | INTRAVENOUS | Status: AC
  Administered 2012-12-10: 1.5 g via INTRAVENOUS
  Filled 2012-12-09: qty 1.5

## 2012-12-09 MED ORDER — SODIUM CHLORIDE 0.9 % IV SOLN
INTRAVENOUS | Status: DC
  Administered 2012-12-10: 10 mL/h via INTRAVENOUS
  Administered 2012-12-10: 13:00:00 via INTRAVENOUS

## 2012-12-10 ENCOUNTER — Encounter (HOSPITAL_COMMUNITY): Payer: Self-pay | Admitting: *Deleted

## 2012-12-10 ENCOUNTER — Ambulatory Visit (HOSPITAL_COMMUNITY): Payer: Medicare Other

## 2012-12-10 ENCOUNTER — Encounter (HOSPITAL_COMMUNITY)
Admission: RE | Disposition: A | Discharge status: Home / Self Care | Payer: Self-pay | Source: Ambulatory Visit | Attending: Vascular Surgery

## 2012-12-10 ENCOUNTER — Encounter (HOSPITAL_COMMUNITY): Payer: Self-pay | Admitting: Certified Registered Nurse Anesthetist

## 2012-12-10 ENCOUNTER — Ambulatory Visit (HOSPITAL_COMMUNITY)
Admission: RE | Admit: 2012-12-10 | Discharge: 2012-12-10 | Disposition: A | Discharge status: Home / Self Care | Payer: Medicare Other | Source: Ambulatory Visit | Attending: Vascular Surgery | Admitting: Vascular Surgery

## 2012-12-10 ENCOUNTER — Ambulatory Visit (HOSPITAL_COMMUNITY): Payer: Medicare Other | Admitting: Certified Registered Nurse Anesthetist

## 2012-12-10 DIAGNOSIS — Z79899 Other long term (current) drug therapy: Secondary | ICD-10-CM | POA: Insufficient documentation

## 2012-12-10 DIAGNOSIS — Z794 Long term (current) use of insulin: Secondary | ICD-10-CM | POA: Insufficient documentation

## 2012-12-10 DIAGNOSIS — Y832 Surgical operation with anastomosis, bypass or graft as the cause of abnormal reaction of the patient, or of later complication, without mention of misadventure at the time of the procedure: Secondary | ICD-10-CM | POA: Insufficient documentation

## 2012-12-10 DIAGNOSIS — E785 Hyperlipidemia, unspecified: Secondary | ICD-10-CM | POA: Insufficient documentation

## 2012-12-10 DIAGNOSIS — Z992 Dependence on renal dialysis: Secondary | ICD-10-CM | POA: Insufficient documentation

## 2012-12-10 DIAGNOSIS — I87309 Chronic venous hypertension (idiopathic) without complications of unspecified lower extremity: Secondary | ICD-10-CM | POA: Insufficient documentation

## 2012-12-10 DIAGNOSIS — Z7982 Long term (current) use of aspirin: Secondary | ICD-10-CM | POA: Insufficient documentation

## 2012-12-10 DIAGNOSIS — R229 Localized swelling, mass and lump, unspecified: Secondary | ICD-10-CM | POA: Insufficient documentation

## 2012-12-10 DIAGNOSIS — E119 Type 2 diabetes mellitus without complications: Secondary | ICD-10-CM | POA: Insufficient documentation

## 2012-12-10 DIAGNOSIS — D649 Anemia, unspecified: Secondary | ICD-10-CM | POA: Insufficient documentation

## 2012-12-10 DIAGNOSIS — N186 End stage renal disease: Secondary | ICD-10-CM

## 2012-12-10 DIAGNOSIS — T82898A Other specified complication of vascular prosthetic devices, implants and grafts, initial encounter: Secondary | ICD-10-CM

## 2012-12-10 DIAGNOSIS — I120 Hypertensive chronic kidney disease with stage 5 chronic kidney disease or end stage renal disease: Secondary | ICD-10-CM | POA: Insufficient documentation

## 2012-12-10 HISTORY — DX: Personal history of other medical treatment: Z92.89

## 2012-12-10 HISTORY — DX: Gastro-esophageal reflux disease without esophagitis: K21.9

## 2012-12-10 HISTORY — DX: Unspecified osteoarthritis, unspecified site: M19.90

## 2012-12-10 HISTORY — PX: ARTERY REPAIR: SHX559

## 2012-12-10 LAB — GLUCOSE, CAPILLARY
Glucose-Capillary: 142 mg/dL — ABNORMAL HIGH (ref 70–99)
Glucose-Capillary: 67 mg/dL — ABNORMAL LOW (ref 70–99)
Glucose-Capillary: 33 mg/dL — CL (ref 70–99)
Glucose-Capillary: 47 mg/dL — ABNORMAL LOW (ref 70–99)

## 2012-12-10 LAB — POCT I-STAT 4, (NA,K, GLUC, HGB,HCT)
Glucose, Bld: 389 mg/dL — ABNORMAL HIGH (ref 70–99)
HCT: 32 % — ABNORMAL LOW (ref 36.0–46.0)
Sodium: 136 mEq/L (ref 135–145)

## 2012-12-10 LAB — SURGICAL PCR SCREEN: Staphylococcus aureus: NEGATIVE

## 2012-12-10 LAB — HCG, SERUM, QUALITATIVE: Preg, Serum: NEGATIVE

## 2012-12-10 SURGERY — REPAIR, ARTERY, BRACHIAL
Anesthesia: Monitor Anesthesia Care | Site: Arm Upper | Laterality: Left | Wound class: Clean

## 2012-12-10 MED ORDER — PROPOFOL INFUSION 10 MG/ML OPTIME
INTRAVENOUS | Status: DC | PRN
  Administered 2012-12-10: 50 ug/kg/min via INTRAVENOUS

## 2012-12-10 MED ORDER — OXYCODONE-ACETAMINOPHEN 5-325 MG PO TABS: 1.0000 | ORAL_TABLET | ORAL | Status: DC | PRN

## 2012-12-10 MED ORDER — HYDROMORPHONE HCL PF 1 MG/ML IJ SOLN: 0.2500 mg | INTRAMUSCULAR | Status: DC | PRN

## 2012-12-10 MED ORDER — LIDOCAINE HCL (CARDIAC) 20 MG/ML IV SOLN
INTRAVENOUS | Status: DC | PRN
  Administered 2012-12-10: 40 mg via INTRAVENOUS

## 2012-12-10 MED ORDER — THROMBIN 20000 UNITS EX SOLR
CUTANEOUS | Status: AC
  Filled 2012-12-10: qty 20000

## 2012-12-10 MED ORDER — DEXTROSE 50 % IV SOLN
50.0000 mL | Freq: Once | INTRAVENOUS | Status: AC
  Administered 2012-12-10: 50 mL via INTRAVENOUS

## 2012-12-10 MED ORDER — LIDOCAINE-EPINEPHRINE (PF) 1 %-1:200000 IJ SOLN
INTRAMUSCULAR | Status: AC
  Filled 2012-12-10: qty 10

## 2012-12-10 MED ORDER — MIDAZOLAM HCL 5 MG/5ML IJ SOLN
INTRAMUSCULAR | Status: DC | PRN
  Administered 2012-12-10: 2 mg via INTRAVENOUS

## 2012-12-10 MED ORDER — FENTANYL CITRATE 0.05 MG/ML IJ SOLN
INTRAMUSCULAR | Status: DC | PRN
  Administered 2012-12-10: 25 ug via INTRAVENOUS

## 2012-12-10 MED ORDER — DEXTROSE 50 % IV SOLN
INTRAVENOUS | Status: AC
  Filled 2012-12-10: qty 50

## 2012-12-10 MED ORDER — SODIUM CHLORIDE 0.9 % IR SOLN
Status: DC | PRN
  Administered 2012-12-10: 15:00:00

## 2012-12-10 MED ORDER — SODIUM CHLORIDE 0.9 % IR SOLN
Status: DC | PRN
  Administered 2012-12-10: 1000 mL

## 2012-12-10 MED ORDER — INSULIN ASPART 100 UNIT/ML ~~LOC~~ SOLN
10.0000 [IU] | Freq: Once | SUBCUTANEOUS | Status: AC
  Administered 2012-12-10: 10 [IU] via SUBCUTANEOUS

## 2012-12-10 MED ORDER — LIDOCAINE-EPINEPHRINE (PF) 1 %-1:200000 IJ SOLN
INTRAMUSCULAR | Status: DC | PRN
  Administered 2012-12-10: 30 mL via INTRADERMAL

## 2012-12-10 MED ORDER — MUPIROCIN 2 % EX OINT
TOPICAL_OINTMENT | Freq: Two times a day (BID) | CUTANEOUS | Status: DC
  Administered 2012-12-10: 1 via NASAL

## 2012-12-10 MED ORDER — INSULIN ASPART 100 UNIT/ML ~~LOC~~ SOLN
SUBCUTANEOUS | Status: AC
  Filled 2012-12-10: qty 1

## 2012-12-10 MED ORDER — MUPIROCIN 2 % EX OINT
TOPICAL_OINTMENT | CUTANEOUS | Status: AC
  Filled 2012-12-10: qty 22

## 2012-12-10 SURGICAL SUPPLY — 36 items
BNDG ESMARK 4X9 LF (GAUZE/BANDAGES/DRESSINGS) IMPLANT
CANISTER SUCTION 2500CC (MISCELLANEOUS) ×2 IMPLANT
CLIP TI MEDIUM 6 (CLIP) ×2 IMPLANT
CLIP TI WIDE RED SMALL 6 (CLIP) ×2 IMPLANT
CLOTH BEACON ORANGE TIMEOUT ST (SAFETY) ×2 IMPLANT
COVER SURGICAL LIGHT HANDLE (MISCELLANEOUS) ×2 IMPLANT
CUFF TOURNIQUET SINGLE 18IN (TOURNIQUET CUFF) IMPLANT
CUFF TOURNIQUET SINGLE 24IN (TOURNIQUET CUFF) IMPLANT
DECANTER SPIKE VIAL GLASS SM (MISCELLANEOUS) IMPLANT
DERMABOND ADVANCED (GAUZE/BANDAGES/DRESSINGS) ×1
DERMABOND ADVANCED .7 DNX12 (GAUZE/BANDAGES/DRESSINGS) ×1 IMPLANT
ELECT REM PT RETURN 9FT ADLT (ELECTROSURGICAL) ×2
ELECTRODE REM PT RTRN 9FT ADLT (ELECTROSURGICAL) ×1 IMPLANT
GLOVE BIO SURGEON STRL SZ7.5 (GLOVE) ×2 IMPLANT
GLOVE BIOGEL PI IND STRL 6.5 (GLOVE) ×3 IMPLANT
GLOVE BIOGEL PI IND STRL 8 (GLOVE) ×1 IMPLANT
GLOVE BIOGEL PI INDICATOR 6.5 (GLOVE) ×3
GLOVE BIOGEL PI INDICATOR 8 (GLOVE) ×1
GLOVE ECLIPSE 6.5 STRL STRAW (GLOVE) ×4 IMPLANT
GOWN STRL NON-REIN LRG LVL3 (GOWN DISPOSABLE) ×4 IMPLANT
KIT BASIN OR (CUSTOM PROCEDURE TRAY) ×2 IMPLANT
KIT ROOM TURNOVER OR (KITS) ×2 IMPLANT
NS IRRIG 1000ML POUR BTL (IV SOLUTION) ×2 IMPLANT
PACK CV ACCESS (CUSTOM PROCEDURE TRAY) ×2 IMPLANT
PAD ARMBOARD 7.5X6 YLW CONV (MISCELLANEOUS) ×4 IMPLANT
PAD CAST 4YDX4 CTTN HI CHSV (CAST SUPPLIES) IMPLANT
PADDING CAST COTTON 4X4 STRL (CAST SUPPLIES)
SPONGE SURGIFOAM ABS GEL 100 (HEMOSTASIS) IMPLANT
SUT PROLENE 6 0 BV (SUTURE) ×2 IMPLANT
SUT VIC AB 3-0 SH 27 (SUTURE) ×2
SUT VIC AB 3-0 SH 27X BRD (SUTURE) ×2 IMPLANT
SUT VICRYL 4-0 PS2 18IN ABS (SUTURE) ×2 IMPLANT
TOWEL OR 17X24 6PK STRL BLUE (TOWEL DISPOSABLE) ×2 IMPLANT
TOWEL OR 17X26 10 PK STRL BLUE (TOWEL DISPOSABLE) ×2 IMPLANT
UNDERPAD 30X30 INCONTINENT (UNDERPADS AND DIAPERS) ×2 IMPLANT
WATER STERILE IRR 1000ML POUR (IV SOLUTION) ×2 IMPLANT

## 2012-12-10 NOTE — Anesthesia Postprocedure Evaluation (Signed)
Anesthesia Post Note  Patient: [name]  Procedure(s) Performed: Procedure(s) (LRB): BRACHIAL ARTERY REPAIR (Left)  Anesthesia type: MAC  Patient location: PACU  Post pain: Pain level controlled  Post assessment: Patient's Cardiovascular Status Stable  Last Vitals:  Filed Vitals:   12/10/12 1645  BP: 147/87  Pulse: 88  Temp:   Resp: 12    Post vital signs: Reviewed and stable  Level of consciousness: sedated  Complications: No apparent anesthesia complications

## 2012-12-10 NOTE — Interval H&P Note (Signed)
History and Physical Interval Note:  12/10/2012 2:04 PM  [name]  has presented today for surgery, with the diagnosis of Arteriovenous Fistula Left Brachial Artery  The various methods of treatment have been discussed with the patient and family. After consideration of risks, benefits and other options for treatment, the patient has consented to  Procedure(s) with comments: BRACHIAL ARTERY REPAIR (Left) - Exploration Left Brachial Artery for AVF as a surgical intervention .  The patient's history has been reviewed, patient examined, no change in status, stable for surgery.  I have reviewed the patient's chart and labs.  Questions were answered to the patient's satisfaction.     [name]

## 2012-12-10 NOTE — Progress Notes (Signed)
Dr. Oletta Lamas also requested CBG to be repeated 20 min after insulin given.

## 2012-12-10 NOTE — Progress Notes (Signed)
Dr. Oletta Lamas given latest CBG of 375.  States to repeat in 2 hours.  Attempted IV  X 1 by myself and x 1 by Pecolia Ades, RN without success in right arm.

## 2012-12-10 NOTE — Preoperative (Signed)
Beta Blockers   Reason not to administer Beta Blockers:Not Applicable, Pt took labetalol at 0715 this am

## 2012-12-10 NOTE — Transfer of Care (Signed)
Immediate Anesthesia Transfer of Care Note  Patient: [name]  Procedure(s) Performed: Procedure(s) with comments: BRACHIAL ARTERY REPAIR (Left) - Exploration Left Brachial Artery for AVF  Patient Location: PACU  Anesthesia Type:General  Level of Consciousness: awake, alert  and oriented  Airway & Oxygen Therapy: Patient Spontanous Breathing and Patient connected to nasal cannula oxygen  Post-op Assessment: Report given to PACU RN and Post -op Vital signs reviewed and stable  Post vital signs: Reviewed and stable  Complications: No apparent anesthesia complications

## 2012-12-10 NOTE — Anesthesia Preprocedure Evaluation (Addendum)
Anesthesia Evaluation  Patient identified by MRN, date of birth, ID band Patient awake    Reviewed: Allergy & Precautions, H&P , NPO status , Patient's Chart, lab work & pertinent test results, reviewed documented beta blocker date and time   Airway Mallampati: II TM Distance: >3 FB Neck ROM: Full    Dental  (+) Dental Advisory Given   Pulmonary  breath sounds clear to auscultation        Cardiovascular hypertension, Pt. on medications and Pt. on home beta blockers Rhythm:Regular Rate:Normal     Neuro/Psych    GI/Hepatic Neg liver ROS, GERD-  Controlled,  Endo/Other  diabetes, Well Controlled, Type 1, Insulin Dependent  Renal/GU DialysisRenal disease     Musculoskeletal   Abdominal   Peds  Hematology   Anesthesia Other Findings   Reproductive/Obstetrics                          Anesthesia Physical Anesthesia Plan  ASA: IV  Anesthesia Plan: General   Post-op Pain Management:    Induction: Intravenous  Airway Management Planned: LMA  Additional Equipment:   Intra-op Plan:   Post-operative Plan: Extubation in OR  Informed Consent: I have reviewed the patients History and Physical, chart, labs and discussed the procedure including the risks, benefits and alternatives for the proposed anesthesia with the patient or authorized representative who has indicated his/her understanding and acceptance.   Dental advisory given  Plan Discussed with: CRNA, Anesthesiologist and Surgeon  Anesthesia Plan Comments:         Anesthesia Quick Evaluation

## 2012-12-10 NOTE — Op Note (Signed)
NAME: [name]   MRN: [id number] DOB: 31-Oct-1975    DATE OF OPERATION: 12/10/2012  PREOP DIAGNOSIS: Pulsatile mass left upper arm  POSTOP DIAGNOSIS: venous hypertension left upper arm  PROCEDURE: exploration of pulsatile mass left upper arm  SURGEON: Judeth Cornfield. Scot Dock, MD, FACS  ASSIST: Leontine Locket, PA  ANESTHESIA: local with sedation   EBL: minimal  INDICATIONS: [name] is a 37 y.o. female who has had a loop upper arm graft in the left arm for many years. She was noted recently to have a pulsatile mass in the mid upper arm it did not appear to be associated with the graft. Duplex suggested an AV fistula with brachial artery. Therefore I recommended exploration.  FINDINGS: the pulsatile mass was a dilated brachial vein related to venous hypertension. The venous anastomosis in the axilla was explored and it looked like the outflow was chronically occluded centrally with retrograde flow in the brachial vein causing a markedly dilated left brachial vein. Ligating the vein distally to the anastomosis would have resulted in graft occlusion. Therefore this was not done.  TECHNIQUE: Patient was brought to the operative room and received sedation. The left upper remedy was prepped and draped in the usual sterile fashion. After the skin was anesthetized with 1% lidocaine, a longitudinal incision was made over the area of concern. Here the pulsatile mass was identified. This was a markedly dilated brachial vein. This was pulsatile. When the graft in the upper arm was occluded the vein lost its pulsatility. The adjacent artery was dissected free and was not attached to the vein in any way. The artery and vein were skeletonized. I therefore elected to explore the venous anastomosis. It was an end-to-side anastomosis and I felt that ligating the vein distal to the anastomosis would solve the problem. A longitudinal incision was made in the left axilla and through dense scar tissue  the venous anastomosis was dissected free. This was an end-to-side anastomosis. Central to the anastomosis however, the vein was sclerotic and likely occluded. The graft was being maintained through retrograde flow in the brachial vein distal to the anastomosis. Ligating the vein here would have occluded the graft. Hemostasis was obtained in the wounds in each of the wounds and closed with deep 3-0 Vicryl and the skin closed with 4-0 Vicryl.  Dermabond was applied. The patient tolerated the procedure well and was transferred to the recovery room in stable condition. All needle and sponge counts were correct.  [name], MD, FACS [facility]  DATE OF DICTATION:   12/10/2012

## 2012-12-10 NOTE — H&P (View-Only) (Signed)
[facility] [place]  Patient name: [name] MRN: [id number] DOB: [birth year] Sex: female  REASON FOR VISIT: pulsatile mass in left upper arm.  HPI: [name] is a 37 y.o. female who has a left upper arm loop AV graft. Recently he was noted that she had a separate pulsatile mass that did not appear to be connected to her AV graft in her left upper arm. This is asymptomatic. She states that this was just noticed recently. It does not cause any pain. She has not had any fever. She does not remember any specific injury in this area.  Past Medical History  Diagnosis Date  . Chronic kidney disease   . Diabetes mellitus   . Hypertension   . Hyperlipidemia   . Anemia    Family History  Problem Relation Age of Onset  . Cancer Mother     breast  . Hypertension Father    SOCIAL HISTORY: History  Substance Use Topics  . Smoking status: Never Smoker   . Smokeless tobacco: Never Used  . Alcohol Use: No   No Known Allergies  Current Outpatient Prescriptions  Medication Sig Dispense Refill  . amLODipine (NORVASC) 10 MG tablet Take 10 mg by mouth daily.        Marland Kitchen aspirin 81 MG tablet Take 81 mg by mouth daily.        . calcium acetate (PHOSLO) 667 MG capsule Take 667 mg by mouth 3 (three) times daily with meals.        . insulin glargine (LANTUS) 100 UNIT/ML injection Inject 20 Units into the skin 2 (two) times daily.       . Insulin Lispro, Human, (HUMALOG PEN [place]) Inject into the skin. Sliding scale      . labetalol (NORMODYNE) 200 MG tablet Take 200 mg by mouth 2 (two) times daily.        . medroxyPROGESTERone (DEPO-PROVERA) 150 MG/ML injection Inject 150 mg into the muscle every 3 (three) months.        . rosuvastatin (CRESTOR) 10 MG tablet Take 10 mg by mouth daily.        . hydrOXYzine (ATARAX/VISTARIL) 25 MG tablet Take 25 mg by mouth 3 (three) times daily as needed.       . insulin aspart (NOVOLOG) 100 UNIT/ML injection Inject into the skin. Take  10-15 units per sliding scale three times / day before meals        No current facility-administered medications for this visit.   REVIEW OF SYSTEMS: Valu.Nieves ] denotes positive finding; [  ] denotes negative finding  CARDIOVASCULAR:  [ ]  chest pain   [ ]  chest pressure   [ ]  palpitations     [ ]  dyspnea on exertion   PULMONARY:   [ ]  productive cough   [ ]  asthma   [ ]  wheezing NEUROLOGIC:   [ ]  weakness  [ ]  paresthesias  [ ]  aphasia  [ ]  amaurosis  [ ]  dizziness HEMATOLOGIC:   [ ]  bleeding problems   [ ]  clotting disorders PSYCHIATRIC:  [ ]  history of major depression INTEGUMENTARY:  [ ]  rashes  [ ]  ulcers CONSTITUTIONAL:  [ ]  fever   [ ]  chills  PHYSICAL EXAM: Filed Vitals:   12/08/12 1132  BP: 147/83  Pulse: 92  Height: 5\' 2"  (1.575 m)  Weight: 121 lb 11.2 oz (55.203 kg)  SpO2: 100%   Body mass index is 22.25 kg/(m^2). GENERAL: The patient is a  well-nourished female, in no acute distress. The vital signs are documented above. CARDIOVASCULAR: There is a regular rate and rhythm.  PULMONARY: There is good air exchange bilaterally without wheezing or rales. In the left upper extremity there is a mass in the medial aspect of the arm which has a thrill and bruit. It is not tender. NEUROLOGIC: No focal weakness or paresthesias are detected. SKIN: There are no ulcers or rashes noted. PSYCHIATRIC: The patient has a normal affect.  DATA:  Duplex scan shows what appears to be potentially an AV fistula related to the brachial artery.  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 I've explained that I would not recommend simply observing this given the risk for continued enlargement. We also discussed the option of proceeding with an arteriogram to further evaluate this but regardless I think it needs to be explored given that it is fairly easy to get at. I feel comfortable exploring this without further  workup. I've explained that we will simply dissected out the artery and vein and identify the problem and then address this locally. We have discussed the procedure potential risks and she is agreeable to proceed. This is scheduled for 12/10/2012.  [place] [facility] [place] Beeper: [phone]

## 2012-12-10 NOTE — Progress Notes (Signed)
IV therapy paged and requested that they come and start IV on pt.

## 2012-12-10 NOTE — Progress Notes (Signed)
Pt's blood sugar 389 per IStat 4.  Dr. Oletta Lamas made aware and ordered IV NS to be started and to give patient 10 units regular (Novolog) insulin subcutaneously.

## 2012-12-10 NOTE — Progress Notes (Signed)
Pt ate peanut butter and crackers, apple juice and ginger ale, pt's blood sugar up to 23, Dr.singer notified, states pt is ok to be transferred to [facility]

## 2012-12-14 ENCOUNTER — Telehealth: Payer: Self-pay | Admitting: Vascular Surgery

## 2012-12-14 ENCOUNTER — Telehealth: Payer: Self-pay

## 2012-12-14 LAB — GLUCOSE, CAPILLARY: Glucose-Capillary: 30 mg/dL — CL (ref 70–99)

## 2012-12-14 NOTE — Telephone Encounter (Signed)
Pt. Called to report swelling on the underneath side of left upper arm.  Denies fever/chills.  States surgical area is tender.  Denies swelling of the forearm or hand.  States the swelling is "in the area of the left arm surgical site above the elbow".  Encouraged to elevate the upper arm above level of heart, at intervals. Also encour. To do gentle active ROM with left arm/ hand/ fingers.   Encouraged to report if swelling worsens, fever, inflammation/redness/ warmth, or incisional drainage occurs.  Verb. Understanding.

## 2012-12-14 NOTE — Telephone Encounter (Addendum)
Message copied by Doristine Section on Tue Dec 14, 2012 12:18 PM ------      Message from: Alfonso Patten      Created: Fri Dec 10, 2012  4:46 PM      Regarding: FW: charge and follow                   ----- Message -----         From: Angelia Mould, MD         Sent: 12/10/2012   3:57 PM           To: Patrici Ranks, Alfonso Patten, RN, #      Subject: charge and follow                                        PROCEDURE: exploration of pulsatile mass left upper arm            SURGEON: Judeth Cornfield. Scot Dock, MD, FACS            ASSIST: Leontine Locket, PA            She needs a follow up visit in 4 weeks. Thank you. CD ------  notified patient of fu appt. on 01-12-13 9:15

## 2012-12-15 ENCOUNTER — Encounter (HOSPITAL_COMMUNITY): Payer: Self-pay | Admitting: Vascular Surgery

## 2012-12-17 ENCOUNTER — Other Ambulatory Visit: Payer: Self-pay

## 2012-12-17 DIAGNOSIS — T82598A Other mechanical complication of other cardiac and vascular devices and implants, initial encounter: Secondary | ICD-10-CM

## 2013-01-11 ENCOUNTER — Encounter: Payer: Self-pay | Admitting: Vascular Surgery

## 2013-01-12 ENCOUNTER — Ambulatory Visit (INDEPENDENT_AMBULATORY_CARE_PROVIDER_SITE_OTHER): Payer: Medicare Other | Admitting: Vascular Surgery

## 2013-01-12 ENCOUNTER — Encounter: Payer: Self-pay | Admitting: Vascular Surgery

## 2013-01-12 VITALS — BP 142/87 | HR 88 | Resp 16 | Ht 63.0 in | Wt 121.0 lb

## 2013-01-12 DIAGNOSIS — N186 End stage renal disease: Secondary | ICD-10-CM

## 2013-01-12 DIAGNOSIS — T82598A Other mechanical complication of other cardiac and vascular devices and implants, initial encounter: Secondary | ICD-10-CM | POA: Insufficient documentation

## 2013-01-12 NOTE — Progress Notes (Signed)
[facility] [place]  Patient name: [name] MRN: [id number] DOB: Oct 21, 1975 Sex: female  REASON FOR VISIT: follow up after exploration a pulsatile mass in the left arm.  HPI: [name] is a 37 y.o. female who had a loop upper arm graft for many years. She developed a pulsatile mass in the mid upper arm which did not appear to be associated with the graft. Duplex suggested possibly an AV fistula with the brachial artery. I therefore recommended exploration. This was performed on 12/10/2012. What we found was that the pulsatile mass was a dilated brachial vein secondary to venous hypertension. The venous anastomosis in the axilla was explored and it looked like the outflow is chronically occluded centrally with retrograde flow in the brachial vein causing a markedly dilated left brachial vein. Ligating the vein would have resulted in graft occlusion. She comes in for wound check today she developed a small blister in her left upper arm.  REVIEW OF SYSTEMS: Valu.Nieves ] denotes positive finding; [  ] denotes negative finding  CARDIOVASCULAR:  [ ]  chest pain   [ ]  dyspnea on exertion    CONSTITUTIONAL:  [ ]  fever   [ ]  chills  PHYSICAL EXAM: Filed Vitals:   01/12/13 0938  BP: 142/87  Pulse: 88  Resp: 16  Height: 5\' 3"  (1.6 m)  Weight: 121 lb (54.885 kg)  SpO2: 100%   Body mass index is 21.44 kg/(m^2). GENERAL: The patient is a well-nourished female, in no acute distress. The vital signs are documented above. CARDIOVASCULAR: There is a regular rate and rhythm  PULMONARY: There is good air exchange bilaterally without wheezing or rales. The upper arm graft has a good bruit and thrill. She has a palpable left brachial and radial pulse. She has a very small 2 mm blister which is related to tape from dialysis.  MEDICAL ISSUES: Her graft is working well. We will see her back as needed.  [place] [facility] [place] Beeper:  [phone]

## 2013-01-29 ENCOUNTER — Emergency Department (HOSPITAL_COMMUNITY): Payer: Medicare Other

## 2013-01-29 ENCOUNTER — Emergency Department (HOSPITAL_COMMUNITY)
Admission: EM | Admit: 2013-01-29 | Discharge: 2013-01-29 | Disposition: A | Discharge status: Home / Self Care | Payer: Medicare Other | Attending: Emergency Medicine | Admitting: Emergency Medicine

## 2013-01-29 ENCOUNTER — Encounter (HOSPITAL_COMMUNITY): Payer: Self-pay | Admitting: *Deleted

## 2013-01-29 DIAGNOSIS — R51 Headache: Secondary | ICD-10-CM | POA: Insufficient documentation

## 2013-01-29 DIAGNOSIS — E1169 Type 2 diabetes mellitus with other specified complication: Secondary | ICD-10-CM | POA: Insufficient documentation

## 2013-01-29 DIAGNOSIS — E785 Hyperlipidemia, unspecified: Secondary | ICD-10-CM | POA: Insufficient documentation

## 2013-01-29 DIAGNOSIS — R112 Nausea with vomiting, unspecified: Secondary | ICD-10-CM | POA: Insufficient documentation

## 2013-01-29 DIAGNOSIS — M129 Arthropathy, unspecified: Secondary | ICD-10-CM | POA: Insufficient documentation

## 2013-01-29 DIAGNOSIS — Z79899 Other long term (current) drug therapy: Secondary | ICD-10-CM | POA: Insufficient documentation

## 2013-01-29 DIAGNOSIS — N189 Chronic kidney disease, unspecified: Secondary | ICD-10-CM | POA: Insufficient documentation

## 2013-01-29 DIAGNOSIS — Z794 Long term (current) use of insulin: Secondary | ICD-10-CM | POA: Insufficient documentation

## 2013-01-29 DIAGNOSIS — Z862 Personal history of diseases of the blood and blood-forming organs and certain disorders involving the immune mechanism: Secondary | ICD-10-CM | POA: Insufficient documentation

## 2013-01-29 DIAGNOSIS — Z8719 Personal history of other diseases of the digestive system: Secondary | ICD-10-CM | POA: Insufficient documentation

## 2013-01-29 DIAGNOSIS — I129 Hypertensive chronic kidney disease with stage 1 through stage 4 chronic kidney disease, or unspecified chronic kidney disease: Secondary | ICD-10-CM | POA: Insufficient documentation

## 2013-01-29 DIAGNOSIS — R739 Hyperglycemia, unspecified: Secondary | ICD-10-CM

## 2013-01-29 DIAGNOSIS — Z7982 Long term (current) use of aspirin: Secondary | ICD-10-CM | POA: Insufficient documentation

## 2013-01-29 LAB — CBC
MCH: 28.7 pg (ref 26.0–34.0)
MCHC: 34.2 g/dL (ref 30.0–36.0)
MCV: 83.9 fL (ref 78.0–100.0)
Platelets: 196 10*3/uL (ref 150–400)

## 2013-01-29 LAB — POCT I-STAT, CHEM 8
Calcium, Ion: 1.08 mmol/L — ABNORMAL LOW (ref 1.12–1.23)
Glucose, Bld: 92 mg/dL (ref 70–99)
HCT: 36 % (ref 36.0–46.0)
Hemoglobin: 12.2 g/dL (ref 12.0–15.0)
TCO2: 29 mmol/L (ref 0–100)

## 2013-01-29 LAB — COMPREHENSIVE METABOLIC PANEL
ALT: 13 U/L (ref 0–35)
AST: 18 U/L (ref 0–37)
Calcium: 9.9 mg/dL (ref 8.4–10.5)
Creatinine, Ser: 3.98 mg/dL — ABNORMAL HIGH (ref 0.50–1.10)
GFR calc Af Amer: 16 mL/min — ABNORMAL LOW (ref >90)
Glucose, Bld: 359 mg/dL — ABNORMAL HIGH (ref 70–99)
Sodium: 134 mEq/L — ABNORMAL LOW (ref 135–145)
Total Protein: 7.7 g/dL (ref 6.0–8.3)

## 2013-01-29 LAB — GLUCOSE, CAPILLARY
Glucose-Capillary: 79 mg/dL (ref 70–99)
Glucose-Capillary: 79 mg/dL (ref 70–99)

## 2013-01-29 MED ORDER — METOCLOPRAMIDE HCL 5 MG/ML IJ SOLN
10.0000 mg | Freq: Once | INTRAMUSCULAR | Status: AC
  Administered 2013-01-29: 10 mg via INTRAVENOUS
  Filled 2013-01-29: qty 2

## 2013-01-29 MED ORDER — SODIUM CHLORIDE 0.9 % IV SOLN
INTRAVENOUS | Status: DC
  Administered 2013-01-29: 20:00:00 via INTRAVENOUS

## 2013-01-29 MED ORDER — DEXTROSE-NACL 5-0.45 % IV SOLN: INTRAVENOUS | Status: DC

## 2013-01-29 MED ORDER — SODIUM CHLORIDE 0.9 % IV SOLN
INTRAVENOUS | Status: DC
  Filled 2013-01-29: qty 1

## 2013-01-29 MED ORDER — DIPHENHYDRAMINE HCL 50 MG/ML IJ SOLN
25.0000 mg | Freq: Once | INTRAMUSCULAR | Status: AC
  Administered 2013-01-29: 25 mg via INTRAVENOUS
  Filled 2013-01-29: qty 1

## 2013-01-29 MED ORDER — DEXTROSE 50 % IV SOLN: 25.0000 mL | INTRAVENOUS | Status: DC | PRN

## 2013-01-29 MED ORDER — ACETAMINOPHEN 325 MG PO TABS: 650.0000 mg | ORAL_TABLET | Freq: Once | ORAL | Status: DC

## 2013-01-29 MED ORDER — INSULIN REGULAR BOLUS VIA INFUSION
0.0000 [IU] | Freq: Three times a day (TID) | INTRAVENOUS | Status: DC
  Filled 2013-01-29: qty 10

## 2013-01-29 NOTE — ED Provider Notes (Signed)
Medical screening examination/treatment/procedure(s) were performed by non-physician practitioner and as supervising physician I was immediately available for consultation/collaboration.   Wandra Arthurs, MD 01/29/13 [phone]

## 2013-01-29 NOTE — ED Notes (Signed)
Pt states she has frontal headache that developed over one hour ago.  Pt checked her sugar and it was 517, she administered 7 units of humalog and her blood pressure was high

## 2013-01-29 NOTE — ED Notes (Addendum)
Sudden onset of headache at approx 4:30-- had dialysis today -- got off machine at 10:30-- ate hotdog, which is normal for after a treatment. When pt checked cbg-- 517, BP 155/103-- feels dizzy when standing.   Attempted to start IV x 3 without success. IV team paged

## 2013-01-29 NOTE — ED Notes (Signed)
Pt informed she needs a urine sample.

## 2013-01-29 NOTE — ED Provider Notes (Signed)
History    CSN: [id number] Arrival date & time 01/29/13  1650  First MD Initiated Contact with Patient 01/29/13 1734     Chief Complaint  Patient presents with  . Headache  . Hyperglycemia   (Consider location/radiation/quality/duration/timing/severity/associated sxs/prior Treatment) HPI Comments: 37 year old female with past medical history of type 1 diabetes, chronic kidney disease, hypertension, hyperlipidemia, anemia and GERD presents to the emergency department complaining of a headache and hyperglycemia beginning about one hour ago around 4:30 this afternoon. Patient states she suddenly developed a frontal headache radiating to her ears, vomited once and headache subsided. She checked her blood sugar and it was noted to be 517, felt as if she had high blood pressure, checked her blood pressure which was 150/103, took her blood pressure medications which she normally takes at night. Also administered 7 units of Humalog at that time. She went to dialysis from 6:30 to 7:30 this morning, no complications, went home and that dog for lunch. Denies fever, chills, abdominal pain, visual disturbance, current nausea or vomiting.  Patient is a 37 y.o. female presenting with headaches and hyperglycemia. The history is provided by the patient.  Headache Associated symptoms: nausea (subsided) and vomiting (subsided)   Associated symptoms: no back pain, no dizziness, no fever and no numbness   Hyperglycemia Associated symptoms: nausea (subsided) and vomiting (subsided)   Associated symptoms: no chest pain, no confusion, no dizziness, no fever and no shortness of breath    Past Medical History  Diagnosis Date  . Chronic kidney disease   . Diabetes mellitus   . Hypertension   . Hyperlipidemia   . Anemia   . GERD (gastroesophageal reflux disease)   . Arthritis   . History of blood transfusion    Past Surgical History  Procedure Laterality Date  . Arteriovenous graft placement    . Cesarean  section    . Eye surgery      [facility]  . Artery repair Left 12/10/2012    Procedure: BRACHIAL ARTERY REPAIR;  Surgeon: Angelia Mould, MD;  Location: [facility] OR;  Service: Vascular;  Laterality: Left;  Exploration Left Brachial Artery for AVF   Family History  Problem Relation Age of Onset  . Cancer Mother     breast  . Hypertension Father    History  Substance Use Topics  . Smoking status: Never Smoker   . Smokeless tobacco: Never Used  . Alcohol Use: No   OB History   Grav Para Term Preterm Abortions TAB SAB Ect Mult Living                 Review of Systems  Constitutional: Negative for fever and chills.  Eyes: Negative for visual disturbance.  Respiratory: Negative for shortness of breath.   Cardiovascular: Negative for chest pain.  Gastrointestinal: Positive for nausea (subsided) and vomiting (subsided).  Genitourinary: Negative for urgency, frequency and difficulty urinating.  Musculoskeletal: Negative for back pain.  Neurological: Positive for headaches. Negative for dizziness, weakness, light-headedness and numbness.  Psychiatric/Behavioral: Negative for confusion.  All other systems reviewed and are negative.    Allergies  Review of patient's allergies indicates no known allergies.  Home Medications   Current Outpatient Rx  Name  Route  Sig  Dispense  Refill  . amLODipine (NORVASC) 10 MG tablet   Oral   Take 10 mg by mouth daily.           Marland Kitchen aspirin 81 MG tablet   Oral   Take 81 mg  by mouth daily.           . calcium acetate (PHOSLO) 667 MG capsule   Oral   Take 667 mg by mouth 3 (three) times daily with meals.           Marland Kitchen GLUCAGON EMERGENCY 1 MG injection   Intramuscular   Inject 1 mg into the muscle once as needed.          Marland Kitchen glucose 4 GM chewable tablet   Oral   Chew 16 g by mouth as needed for low blood sugar.         . insulin glargine (LANTUS) 100 UNIT/ML injection   Subcutaneous   Inject 10 Units into the skin at bedtime.           . insulin lispro (HUMALOG) 100 UNIT/ML injection   Subcutaneous   Inject 3-15 Units into the skin 3 (three) times daily before meals. Per sliding scale         . labetalol (NORMODYNE) 300 MG tablet   Oral   Take 300 mg by mouth 2 (two) times daily.         . medroxyPROGESTERone (DEPO-PROVERA) 150 MG/ML injection   Intramuscular   Inject 150 mg into the muscle every 3 (three) months.           . rosuvastatin (CRESTOR) 10 MG tablet   Oral   Take 10 mg by mouth daily.            BP 164/93  Pulse 98  Temp(Src) 98.5 F (36.9 C) (Oral)  Resp 22  SpO2 100% Physical Exam  Nursing note and vitals reviewed. Constitutional: She is oriented to person, place, and time. She appears well-developed and well-nourished. No distress.  HENT:  Head: Normocephalic and atraumatic.  Mouth/Throat: Oropharynx is clear and moist.  Eyes: Conjunctivae and EOM are normal. Pupils are equal, round, and reactive to light.  Neck: Normal range of motion. Neck supple. No JVD present.  Cardiovascular: Normal rate, regular rhythm, normal heart sounds and intact distal pulses.   Pulmonary/Chest: Effort normal and breath sounds normal. No respiratory distress. She has no wheezes. She has no rhonchi. She has no rales.  Abdominal: Soft. Normal appearance and bowel sounds are normal. She exhibits no distension. There is no tenderness.  Musculoskeletal: Normal range of motion. She exhibits no edema.  Neurological: She is alert and oriented to person, place, and time. She has normal strength. No cranial nerve deficit or sensory deficit.  Skin: Skin is warm and dry. She is not diaphoretic.  Psychiatric: She has a normal mood and affect. Her behavior is normal.    ED Course  Procedures (including critical care time) Labs Reviewed  COMPREHENSIVE METABOLIC PANEL - Abnormal; Notable for the following:    Sodium 134 (*)    Chloride 92 (*)    Glucose, Bld 359 (*)    Creatinine, Ser 3.98 (*)    GFR calc  non Af Amer 14 (*)    GFR calc Af Amer 16 (*)    All other components within normal limits  GLUCOSE, CAPILLARY - Abnormal; Notable for the following:    Glucose-Capillary 441 (*)    All other components within normal limits  POCT I-STAT, CHEM 8 - Abnormal; Notable for the following:    Creatinine, Ser 4.80 (*)    Calcium, Ion 1.08 (*)    All other components within normal limits  CBC  GLUCOSE, CAPILLARY  GLUCOSE, CAPILLARY   Ct Head Wo  Contrast  01/29/2013   *RADIOLOGY REPORT*  Clinical Data: Headache.  Dialysis patient  CT HEAD WITHOUT CONTRAST  Technique:  Contiguous axial images were obtained from the base of the skull through the vertex without contrast.  Comparison: None  Findings: Generalized atrophy is present.  No hydrocephalus.  Negative for hemorrhage, mass, or infarction.  Calvarium is intact.  IMPRESSION: Generalized atrophy without acute abnormality.   Original Report Authenticated By: Carl Best, M.D.   1. Headache   2. Hyperglycemia     MDM  Patient with sudden onset headache, relieved by vomiting. Hyperglycemic. She is a dialysis patient, tues, thurs, Saturday. Unremarkable PE, NAD, afebrile. CT head to r/o acute cause of sudden onset headache. glucostabilizer for hyperglycemia of 441. No acidosis. Patient discussed with Dr. Darl Householder who agrees with plan of care. 8:44 PM Prior to start of glucostablizer, glucose on CBG 79. Checked twice, same result. Istat chem 8 ordered, glucose 92. Glucostabilizer cancelled. Headache subsiding with reglan and benadryl. She is stable for discharge. Monitors glucose at home. Will f/u with PCP. Dr. Darl Householder agreeable with plan. Return precautions discussed. Patient states understanding of plan and is agreeable.   Illene Labrador, PA-C 01/29/13 2048

## 2013-01-29 NOTE — ED Notes (Signed)
CBG CHECKED 79

## 2013-05-26 ENCOUNTER — Other Ambulatory Visit: Payer: Self-pay

## 2013-07-17 ENCOUNTER — Encounter (HOSPITAL_COMMUNITY): Payer: Self-pay | Admitting: Emergency Medicine

## 2013-07-17 ENCOUNTER — Emergency Department (HOSPITAL_COMMUNITY)
Admission: EM | Admit: 2013-07-17 | Discharge: 2013-07-17 | Disposition: A | Discharge status: Home / Self Care | Payer: Medicare Other | Attending: Emergency Medicine | Admitting: Emergency Medicine

## 2013-07-17 DIAGNOSIS — Z794 Long term (current) use of insulin: Secondary | ICD-10-CM | POA: Insufficient documentation

## 2013-07-17 DIAGNOSIS — E785 Hyperlipidemia, unspecified: Secondary | ICD-10-CM | POA: Insufficient documentation

## 2013-07-17 DIAGNOSIS — Z992 Dependence on renal dialysis: Secondary | ICD-10-CM | POA: Insufficient documentation

## 2013-07-17 DIAGNOSIS — Z79899 Other long term (current) drug therapy: Secondary | ICD-10-CM | POA: Insufficient documentation

## 2013-07-17 DIAGNOSIS — E875 Hyperkalemia: Secondary | ICD-10-CM | POA: Insufficient documentation

## 2013-07-17 DIAGNOSIS — Z862 Personal history of diseases of the blood and blood-forming organs and certain disorders involving the immune mechanism: Secondary | ICD-10-CM | POA: Insufficient documentation

## 2013-07-17 DIAGNOSIS — N186 End stage renal disease: Secondary | ICD-10-CM | POA: Insufficient documentation

## 2013-07-17 DIAGNOSIS — Z7982 Long term (current) use of aspirin: Secondary | ICD-10-CM | POA: Insufficient documentation

## 2013-07-17 DIAGNOSIS — E162 Hypoglycemia, unspecified: Secondary | ICD-10-CM

## 2013-07-17 DIAGNOSIS — M129 Arthropathy, unspecified: Secondary | ICD-10-CM | POA: Insufficient documentation

## 2013-07-17 DIAGNOSIS — E1169 Type 2 diabetes mellitus with other specified complication: Secondary | ICD-10-CM | POA: Insufficient documentation

## 2013-07-17 DIAGNOSIS — I120 Hypertensive chronic kidney disease with stage 5 chronic kidney disease or end stage renal disease: Secondary | ICD-10-CM | POA: Insufficient documentation

## 2013-07-17 DIAGNOSIS — Z8719 Personal history of other diseases of the digestive system: Secondary | ICD-10-CM | POA: Insufficient documentation

## 2013-07-17 LAB — CBC WITH DIFFERENTIAL/PLATELET
Basophils Relative: 0 % (ref 0–1)
Eosinophils Absolute: 0 10*3/uL (ref 0.0–0.7)
Hemoglobin: 13.7 g/dL (ref 12.0–15.0)
MCH: 29.8 pg (ref 26.0–34.0)
MCHC: 33.3 g/dL (ref 30.0–36.0)
Monocytes Relative: 2 % — ABNORMAL LOW (ref 3–12)
Neutrophils Relative %: 87 % — ABNORMAL HIGH (ref 43–77)
Platelets: 195 10*3/uL (ref 150–400)
RDW: 14 % (ref 11.5–15.5)

## 2013-07-17 LAB — COMPREHENSIVE METABOLIC PANEL
Albumin: 4.2 g/dL (ref 3.5–5.2)
Alkaline Phosphatase: 70 U/L (ref 39–117)
BUN: 26 mg/dL — ABNORMAL HIGH (ref 6–23)
Potassium: 5.2 mEq/L — ABNORMAL HIGH (ref 3.5–5.1)
Total Protein: 7.8 g/dL (ref 6.0–8.3)

## 2013-07-17 MED ORDER — AMLODIPINE BESYLATE 10 MG PO TABS
10.0000 mg | ORAL_TABLET | Freq: Once | ORAL | Status: AC
  Administered 2013-07-17: 10 mg via ORAL
  Filled 2013-07-17: qty 1

## 2013-07-17 NOTE — ED Notes (Signed)
Pt states she is not pregnant

## 2013-07-17 NOTE — ED Notes (Signed)
This nurse unable to get labs, phlebotomy notified.

## 2013-07-17 NOTE — ED Notes (Signed)
Phlebotomy at the bedside  

## 2013-07-17 NOTE — ED Provider Notes (Signed)
CSN: [id number]     Arrival date & time 07/17/13  1229 History   First MD Initiated Contact with Patient 07/17/13 1243     Chief Complaint  Patient presents with  . Hypoglycemia   (Consider location/radiation/quality/duration/timing/severity/associated sxs/prior Treatment) HPI  Is a 37 year old female who presents after being found unresponsive at home. Patient has history of diabetes, hypertension, and end-stage renal disease. Patient dialyzed yesterday. She took her normal dose of 14 units of Lantus last night. Patient states that she did not wake up to eat reakfast this morning. Her family found her "unresponsive" just prior to arrival. Patient's glucose was noted to be 30 on EMS arrival she was given glucagon and glucose. CBG at the bedside is 96. Patient is awake, alert, and oriented. She has no physical complaints at this time.  Past Medical History  Diagnosis Date  . Chronic kidney disease   . Diabetes mellitus   . Hypertension   . Hyperlipidemia   . Anemia   . GERD (gastroesophageal reflux disease)   . Arthritis   . History of blood transfusion    Past Surgical History  Procedure Laterality Date  . Arteriovenous graft placement    . Cesarean section    . Eye surgery      LAzer  . Artery repair Left 12/10/2012    Procedure: BRACHIAL ARTERY REPAIR;  Surgeon: Angelia Mould, MD;  Location: [facility] OR;  Service: Vascular;  Laterality: Left;  Exploration Left Brachial Artery for AVF   Family History  Problem Relation Age of Onset  . Cancer Mother     breast  . Hypertension Father    History  Substance Use Topics  . Smoking status: Never Smoker   . Smokeless tobacco: Never Used  . Alcohol Use: No   OB History   Grav Para Term Preterm Abortions TAB SAB Ect Mult Living                 Review of Systems  Constitutional: Negative for fever.  Respiratory: Negative for cough, chest tightness and shortness of breath.   Cardiovascular: Negative for chest pain.   Gastrointestinal: Negative for nausea, vomiting and abdominal pain.  Genitourinary: Negative for dysuria.  Musculoskeletal: Negative for back pain.  Skin: Negative for wound.  Neurological: Negative for weakness and headaches.  Psychiatric/Behavioral: Negative for confusion.  All other systems reviewed and are negative.    Allergies  Review of patient's allergies indicates no known allergies.  Home Medications   Current Outpatient Rx  Name  Route  Sig  Dispense  Refill  . amLODipine (NORVASC) 10 MG tablet   Oral   Take 10 mg by mouth See admin instructions. Take once a day on days when given dialysis (usually Tuesday, Thursday, and Saturday)         . aspirin 81 MG tablet   Oral   Take 81 mg by mouth daily.           . calcium acetate (PHOSLO) 667 MG capsule   Oral   Take 667 mg by mouth 3 (three) times daily with meals.           Marland Kitchen GLUCAGON EMERGENCY 1 MG injection   Intramuscular   Inject 1 mg into the muscle once as needed.          . insulin glargine (LANTUS) 100 UNIT/ML injection   Subcutaneous   Inject 10 Units into the skin at bedtime.          Marland Kitchen  insulin lispro (HUMALOG) 100 UNIT/ML injection   Subcutaneous   Inject 3-15 Units into the skin 3 (three) times daily before meals. Per sliding scale         . labetalol (NORMODYNE) 300 MG tablet   Oral   Take 300 mg by mouth See admin instructions. Take three times a day on days when given dialysis (usually Tuesday, Thursday, and Saturday)         . Pediatric Multiple Vit-C-FA (FLINSTONES GUMMIES OMEGA-3 DHA) CHEW   Oral   Chew 1 tablet by mouth daily as needed (Nutritional supplementation).         . rosuvastatin (CRESTOR) 10 MG tablet   Oral   Take 10 mg by mouth See admin instructions. Take once a day on days when given dialysis (usually Tuesday, Thursday, and Saturday)         . glucose 4 GM chewable tablet   Oral   Chew 16 g by mouth as needed for low blood sugar.         .  medroxyPROGESTERone (DEPO-PROVERA) 150 MG/ML injection   Intramuscular   Inject 150 mg into the muscle every 3 (three) months.            BP 158/91  Pulse 94  Temp(Src) 97.8 F (36.6 C) (Oral)  Resp 16  SpO2 100% Physical Exam  Nursing note and vitals reviewed. Constitutional: She is oriented to person, place, and time. She appears well-developed and well-nourished. No distress.  HENT:  Head: Normocephalic and atraumatic.  Eyes: Pupils are equal, round, and reactive to light.  Neck: Neck supple.  Cardiovascular: Normal rate, regular rhythm and normal heart sounds.   No murmur heard. Pulmonary/Chest: Effort normal and breath sounds normal. No respiratory distress. She has no wheezes.  Abdominal: Soft. There is no tenderness.  Musculoskeletal: She exhibits no edema.  Neurological: She is alert and oriented to person, place, and time.  Skin: Skin is warm and dry.  Psychiatric: She has a normal mood and affect.    ED Course  Procedures (including critical care time) Labs Review Labs Reviewed  CBC WITH DIFFERENTIAL - Abnormal; Notable for the following:    Neutrophils Relative % 87 (*)    Neutro Abs 7.8 (*)    Lymphocytes Relative 11 (*)    Monocytes Relative 2 (*)    All other components within normal limits  COMPREHENSIVE METABOLIC PANEL - Abnormal; Notable for the following:    Potassium 5.2 (*)    Chloride 94 (*)    Glucose, Bld 305 (*)    BUN 26 (*)    Creatinine, Ser 7.85 (*)    GFR calc non Af Amer 6 (*)    GFR calc Af Amer 7 (*)    All other components within normal limits  GLUCOSE, CAPILLARY  URINALYSIS, ROUTINE W REFLEX MICROSCOPIC   Imaging Review No results found.  EKG Interpretation    Date/Time:  Sunday July 17 2013 15:42:57 EST Ventricular Rate:  93 PR Interval:  135 QRS Duration: 65 QT Interval:  377 QTC Calculation: 469 R Axis:   61 Text Interpretation:  Sinus rhythm Consider left ventricular hypertrophy Borderline T abnormalities,  inferior leads Confirmed by HORTON  MD, COURTNEY ([id number]) on 07/17/2013 4:18:26 PM            MDM   1. Hypoglycemia   2. Hyperkalemia    Patient presents with hypoglycemia.  She has had prior episodes in the past.  Nontoxic and nonfocal on exam.  Did not eat breakfast this morning.  Is s/p glucose and glucagon.  CBG 96.  BMP with glucose of 305 s/p oral repletion and meal.  K of 5.2.  Patient due to dialyze tomorrow.  EKG without acute change.  Patient's hypoglycemia likely 2/2 missed meal this morning.  WIll follow-up for dialysis tomorrow and PCP for follow-up.  After history, exam, and medical workup I feel the patient has been appropriately medically screened and is safe for discharge home. Pertinent diagnoses were discussed with the patient. Patient was given return precautions.   Merryl Hacker, MD 07/17/13 [phone]

## 2013-07-17 NOTE — ED Notes (Signed)
Per ems, pt went to dialysis yesterday, remembers waking up at 600am this morning, fell back asleep. Family member found pt unresponsive, called ems. Pt CBG was 30 upon ems arrival, given glucogon, CBG was 34, given tube of glucose, CBG 96. Pt alert and oriented upon arrival to ED, following commands. Pt denies CP, SOB, abdominal pain. States she has a slight headache.

## 2013-07-17 NOTE — ED Notes (Signed)
Patient is aware urine is needed for urinalysis

## 2013-10-31 ENCOUNTER — Other Ambulatory Visit: Payer: Self-pay | Admitting: *Deleted

## 2013-10-31 DIAGNOSIS — T82598A Other mechanical complication of other cardiac and vascular devices and implants, initial encounter: Secondary | ICD-10-CM

## 2013-11-09 ENCOUNTER — Ambulatory Visit: Payer: Medicare Other | Admitting: Vascular Surgery

## 2013-11-09 ENCOUNTER — Other Ambulatory Visit (HOSPITAL_COMMUNITY): Payer: Medicare Other

## 2013-11-25 ENCOUNTER — Encounter: Payer: Self-pay | Admitting: Surgery

## 2013-11-28 ENCOUNTER — Ambulatory Visit (INDEPENDENT_AMBULATORY_CARE_PROVIDER_SITE_OTHER): Payer: Medicare Other | Admitting: Surgery

## 2013-11-28 ENCOUNTER — Encounter: Payer: Self-pay | Admitting: Surgery

## 2013-11-28 ENCOUNTER — Other Ambulatory Visit: Payer: Self-pay

## 2013-11-28 ENCOUNTER — Ambulatory Visit (HOSPITAL_COMMUNITY)
Admission: RE | Admit: 2013-11-28 | Discharge: 2013-11-28 | Disposition: A | Discharge status: Home / Self Care | Payer: Medicare Other | Source: Ambulatory Visit | Attending: Surgery | Admitting: Surgery

## 2013-11-28 VITALS — BP 173/87 | HR 85 | Ht 63.0 in | Wt 121.6 lb

## 2013-11-28 DIAGNOSIS — T82598A Other mechanical complication of other cardiac and vascular devices and implants, initial encounter: Secondary | ICD-10-CM

## 2013-11-28 DIAGNOSIS — N186 End stage renal disease: Secondary | ICD-10-CM

## 2013-11-28 DIAGNOSIS — T82898A Other specified complication of vascular prosthetic devices, implants and grafts, initial encounter: Secondary | ICD-10-CM | POA: Insufficient documentation

## 2013-11-28 DIAGNOSIS — Y841 Kidney dialysis as the cause of abnormal reaction of the patient, or of later complication, without mention of misadventure at the time of the procedure: Secondary | ICD-10-CM | POA: Insufficient documentation

## 2013-11-28 NOTE — Progress Notes (Signed)
Patient name: [name] MRN: [id number] DOB: 1976-07-06 Sex: female     Chief Complaint  Patient presents with  . Re-evaluation    recurrent aneurysm post removal    HISTORY OF PRESENT ILLNESS: The patient comes back today with a recurrence of what was felt to be a dilation in her brachial vein.  The patient has a left upper arm loop graft which has been present for many years.  Last year she developed a pulsatile mass that did not appear to be associated with her graft.  She underwent exploration by Dr. Scot Dock.  Operative findings included a venous outflow occlusion with retrograde filling of the brachial vein.  This was not ligated as it would've caused occlusion of the graft.  The area of concern became less noticeable until recently.  Does cause her some pain.  Past Medical History  Diagnosis Date  . Chronic kidney disease   . Diabetes mellitus   . Hypertension   . Hyperlipidemia   . Anemia   . GERD (gastroesophageal reflux disease)   . Arthritis   . History of blood transfusion     Past Surgical History  Procedure Laterality Date  . Arteriovenous graft placement    . Cesarean section    . Eye surgery      LAzer  . Artery repair Left 12/10/2012    Procedure: BRACHIAL ARTERY REPAIR;  Surgeon: Angelia Mould, MD;  Location: [facility] OR;  Service: Vascular;  Laterality: Left;  Exploration Left Brachial Artery for AVF    History   Social History  . Marital Status: Single    Spouse Name: N/A    Number of Children: N/A  . Years of Education: N/A   Occupational History  . Not on file.   Social History Main Topics  . Smoking status: Never Smoker   . Smokeless tobacco: Never Used  . Alcohol Use: No  . Drug Use: No  . Sexual Activity: Not on file   Other Topics Concern  . Not on file   Social History Narrative  . No narrative on file    Family History  Problem Relation Age of Onset  . Cancer Mother     breast  . Hypertension Father      Allergies as of 11/28/2013  . (No Known Allergies)    Current Outpatient Prescriptions on File Prior to Visit  Medication Sig Dispense Refill  . amLODipine (NORVASC) 10 MG tablet Take 10 mg by mouth See admin instructions. Take once a day on days when given dialysis (usually Tuesday, Thursday, and Saturday)      . aspirin 81 MG tablet Take 81 mg by mouth daily.        . calcium acetate (PHOSLO) 667 MG capsule Take 667 mg by mouth 3 (three) times daily with meals.        Marland Kitchen GLUCAGON EMERGENCY 1 MG injection Inject 1 mg into the muscle once as needed.       Marland Kitchen glucose 4 GM chewable tablet Chew 16 g by mouth as needed for low blood sugar.      . insulin glargine (LANTUS) 100 UNIT/ML injection Inject 10 Units into the skin at bedtime.       . insulin lispro (HUMALOG) 100 UNIT/ML injection Inject 3-15 Units into the skin 3 (three) times daily before meals. Per sliding scale      . labetalol (NORMODYNE) 300 MG tablet Take 600 mg by mouth 3 (three) times daily.       Marland Kitchen  medroxyPROGESTERone (DEPO-PROVERA) 150 MG/ML injection Inject 150 mg into the muscle every 3 (three) months.        . Pediatric Multiple Vit-C-FA (FLINSTONES GUMMIES OMEGA-3 DHA) CHEW Chew 1 tablet by mouth daily as needed (Nutritional supplementation).      . rosuvastatin (CRESTOR) 10 MG tablet Take 10 mg by mouth See admin instructions. Take once a day on days when given dialysis (usually Tuesday, Thursday, and Saturday)       No current facility-administered medications on file prior to visit.     REVIEW OF SYSTEMS: Please see history of present illness, otherwise all systems negative PHYSICAL EXAMINATION:   Vital signs are BP 173/87  Pulse 85  Ht 5\' 3"  (1.6 m)  Wt 121 lb 9.6 oz (55.157 kg)  BMI 21.55 kg/m2  SpO2 100% General: The patient appears their stated age. HEENT:  No gross abnormalities Pulmonary:  Non labored breathing Musculoskeletal: There are no major deformities. Neurologic: No focal weakness or  paresthesias are detected, Skin: There are no ulcer or rashes noted. Psychiatric: The patient has normal affect. Cardiovascular: There is a regular rate and rhythm without significant murmur appreciated.  Thrill within left arm loop graft.  Pulsatile bulge in the mid upper arm around the previous incision   Diagnostic Studies Ultrasound imaging was ordered and reviewed.  This shows that the area of concern is a connection between 2 brachial veins, possibly the basilic vein.  The venous outflow is occluded.  Assessment: Pulsatile brachial vein, end-stage renal disease Plan: I have recommended proceeding with a shuntogram to better evaluate the patient's anatomy.  She may be a candidate to identify and other pain in the axilla for the venous anastomosis.  This may help her problem but may not completely resolve the issue.  I scheduled her shuntogram for this Wednesday  V. Leia Alf, M.D. [facility] Office: [phone] Pager:  [phone]

## 2013-11-29 ENCOUNTER — Encounter (HOSPITAL_COMMUNITY): Payer: Self-pay | Admitting: Pharmacy Technician

## 2013-11-29 MED ORDER — SODIUM CHLORIDE 0.9 % IV SOLN: INTRAVENOUS | Status: DC

## 2013-11-30 ENCOUNTER — Ambulatory Visit (HOSPITAL_COMMUNITY)
Admission: RE | Admit: 2013-11-30 | Discharge: 2013-11-30 | Disposition: A | Discharge status: Home / Self Care | Payer: Medicare Other | Source: Ambulatory Visit | Attending: Surgery | Admitting: Surgery

## 2013-11-30 ENCOUNTER — Encounter (HOSPITAL_COMMUNITY)
Admission: RE | Disposition: A | Discharge status: Home / Self Care | Payer: Self-pay | Source: Ambulatory Visit | Attending: Surgery

## 2013-11-30 DIAGNOSIS — E119 Type 2 diabetes mellitus without complications: Secondary | ICD-10-CM | POA: Insufficient documentation

## 2013-11-30 DIAGNOSIS — E785 Hyperlipidemia, unspecified: Secondary | ICD-10-CM | POA: Insufficient documentation

## 2013-11-30 DIAGNOSIS — Z7982 Long term (current) use of aspirin: Secondary | ICD-10-CM | POA: Insufficient documentation

## 2013-11-30 DIAGNOSIS — T82898A Other specified complication of vascular prosthetic devices, implants and grafts, initial encounter: Secondary | ICD-10-CM

## 2013-11-30 DIAGNOSIS — I120 Hypertensive chronic kidney disease with stage 5 chronic kidney disease or end stage renal disease: Secondary | ICD-10-CM | POA: Insufficient documentation

## 2013-11-30 DIAGNOSIS — Y832 Surgical operation with anastomosis, bypass or graft as the cause of abnormal reaction of the patient, or of later complication, without mention of misadventure at the time of the procedure: Secondary | ICD-10-CM | POA: Insufficient documentation

## 2013-11-30 DIAGNOSIS — Z79899 Other long term (current) drug therapy: Secondary | ICD-10-CM | POA: Insufficient documentation

## 2013-11-30 DIAGNOSIS — Z794 Long term (current) use of insulin: Secondary | ICD-10-CM | POA: Insufficient documentation

## 2013-11-30 DIAGNOSIS — N186 End stage renal disease: Secondary | ICD-10-CM | POA: Insufficient documentation

## 2013-11-30 HISTORY — PX: SHUNTOGRAM: SHX5491

## 2013-11-30 LAB — POCT I-STAT, CHEM 8
BUN: 22 mg/dL (ref 6–23)
CHLORIDE: 94 meq/L — AB (ref 96–112)
Calcium, Ion: 1.12 mmol/L (ref 1.12–1.23)
Creatinine, Ser: 7 mg/dL — ABNORMAL HIGH (ref 0.50–1.10)
GLUCOSE: 368 mg/dL — AB (ref 70–99)
HCT: 35 % — ABNORMAL LOW (ref 36.0–46.0)
Hemoglobin: 11.9 g/dL — ABNORMAL LOW (ref 12.0–15.0)
POTASSIUM: 4.1 meq/L (ref 3.7–5.3)
Sodium: 134 mEq/L — ABNORMAL LOW (ref 137–147)
TCO2: 25 mmol/L (ref 0–100)

## 2013-11-30 LAB — GLUCOSE, CAPILLARY
GLUCOSE-CAPILLARY: 242 mg/dL — AB (ref 70–99)
Glucose-Capillary: 385 mg/dL — ABNORMAL HIGH (ref 70–99)

## 2013-11-30 LAB — HCG, SERUM, QUALITATIVE: Preg, Serum: NEGATIVE

## 2013-11-30 SURGERY — ASSESSMENT, SHUNT FUNCTION, WITH CONTRAST RADIOGRAPHIC STUDY
Anesthesia: LOCAL | Laterality: Left

## 2013-11-30 MED ORDER — HYDRALAZINE HCL 20 MG/ML IJ SOLN: 10.0000 mg | INTRAMUSCULAR | Status: DC | PRN

## 2013-11-30 MED ORDER — METOPROLOL TARTRATE 1 MG/ML IV SOLN: 2.0000 mg | INTRAVENOUS | Status: DC | PRN

## 2013-11-30 MED ORDER — HEPARIN (PORCINE) IN NACL 2-0.9 UNIT/ML-% IJ SOLN
INTRAMUSCULAR | Status: AC
  Filled 2013-11-30: qty 500

## 2013-11-30 MED ORDER — ACETAMINOPHEN 325 MG PO TABS: 325.0000 mg | ORAL_TABLET | ORAL | Status: DC | PRN

## 2013-11-30 MED ORDER — ALUM & MAG HYDROXIDE-SIMETH 200-200-20 MG/5ML PO SUSP: 15.0000 mL | ORAL | Status: DC | PRN

## 2013-11-30 MED ORDER — LABETALOL HCL 5 MG/ML IV SOLN: 10.0000 mg | INTRAVENOUS | Status: DC | PRN

## 2013-11-30 MED ORDER — GUAIFENESIN-DM 100-10 MG/5ML PO SYRP: 15.0000 mL | ORAL_SOLUTION | ORAL | Status: DC | PRN

## 2013-11-30 MED ORDER — ACETAMINOPHEN 325 MG RE SUPP: 325.0000 mg | RECTAL | Status: DC | PRN

## 2013-11-30 MED ORDER — LIDOCAINE HCL (PF) 1 % IJ SOLN
INTRAMUSCULAR | Status: AC
Start: 2013-11-30 — End: 2013-11-30
  Filled 2013-11-30: qty 30

## 2013-11-30 MED ORDER — PHENOL 1.4 % MT LIQD: 1.0000 | OROMUCOSAL | Status: DC | PRN

## 2013-11-30 MED ORDER — ONDANSETRON HCL 4 MG/2ML IJ SOLN: 4.0000 mg | Freq: Four times a day (QID) | INTRAMUSCULAR | Status: DC | PRN

## 2013-11-30 NOTE — H&P (View-Only) (Signed)
Patient name: [name] MRN: [id number] DOB: 13-Sep-1975 Sex: female     Chief Complaint  Patient presents with  . Re-evaluation    recurrent aneurysm post removal    HISTORY OF PRESENT ILLNESS: The patient comes back today with a recurrence of what was felt to be a dilation in her brachial vein.  The patient has a left upper arm loop graft which has been present for many years.  Last year she developed a pulsatile mass that did not appear to be associated with her graft.  She underwent exploration by Dr. Scot Dock.  Operative findings included a venous outflow occlusion with retrograde filling of the brachial vein.  This was not ligated as it would've caused occlusion of the graft.  The area of concern became less noticeable until recently.  Does cause her some pain.  Past Medical History  Diagnosis Date  . Chronic kidney disease   . Diabetes mellitus   . Hypertension   . Hyperlipidemia   . Anemia   . GERD (gastroesophageal reflux disease)   . Arthritis   . History of blood transfusion     Past Surgical History  Procedure Laterality Date  . Arteriovenous graft placement    . Cesarean section    . Eye surgery      LAzer  . Artery repair Left 12/10/2012    Procedure: BRACHIAL ARTERY REPAIR;  Surgeon: Angelia Mould, MD;  Location: [facility] OR;  Service: Vascular;  Laterality: Left;  Exploration Left Brachial Artery for AVF    History   Social History  . Marital Status: Single    Spouse Name: N/A    Number of Children: N/A  . Years of Education: N/A   Occupational History  . Not on file.   Social History Main Topics  . Smoking status: Never Smoker   . Smokeless tobacco: Never Used  . Alcohol Use: No  . Drug Use: No  . Sexual Activity: Not on file   Other Topics Concern  . Not on file   Social History Narrative  . No narrative on file    Family History  Problem Relation Age of Onset  . Cancer Mother     breast  . Hypertension Father      Allergies as of 11/28/2013  . (No Known Allergies)    Current Outpatient Prescriptions on File Prior to Visit  Medication Sig Dispense Refill  . amLODipine (NORVASC) 10 MG tablet Take 10 mg by mouth See admin instructions. Take once a day on days when given dialysis (usually Tuesday, Thursday, and Saturday)      . aspirin 81 MG tablet Take 81 mg by mouth daily.        . calcium acetate (PHOSLO) 667 MG capsule Take 667 mg by mouth 3 (three) times daily with meals.        Marland Kitchen GLUCAGON EMERGENCY 1 MG injection Inject 1 mg into the muscle once as needed.       Marland Kitchen glucose 4 GM chewable tablet Chew 16 g by mouth as needed for low blood sugar.      . insulin glargine (LANTUS) 100 UNIT/ML injection Inject 10 Units into the skin at bedtime.       . insulin lispro (HUMALOG) 100 UNIT/ML injection Inject 3-15 Units into the skin 3 (three) times daily before meals. Per sliding scale      . labetalol (NORMODYNE) 300 MG tablet Take 600 mg by mouth 3 (three) times daily.       Marland Kitchen  medroxyPROGESTERone (DEPO-PROVERA) 150 MG/ML injection Inject 150 mg into the muscle every 3 (three) months.        . Pediatric Multiple Vit-C-FA (FLINSTONES GUMMIES OMEGA-3 DHA) CHEW Chew 1 tablet by mouth daily as needed (Nutritional supplementation).      . rosuvastatin (CRESTOR) 10 MG tablet Take 10 mg by mouth See admin instructions. Take once a day on days when given dialysis (usually Tuesday, Thursday, and Saturday)       No current facility-administered medications on file prior to visit.     REVIEW OF SYSTEMS: Please see history of present illness, otherwise all systems negative PHYSICAL EXAMINATION:   Vital signs are BP 173/87  Pulse 85  Ht 5\' 3"  (1.6 m)  Wt 121 lb 9.6 oz (55.157 kg)  BMI 21.55 kg/m2  SpO2 100% General: The patient appears their stated age. HEENT:  No gross abnormalities Pulmonary:  Non labored breathing Musculoskeletal: There are no major deformities. Neurologic: No focal weakness or  paresthesias are detected, Skin: There are no ulcer or rashes noted. Psychiatric: The patient has normal affect. Cardiovascular: There is a regular rate and rhythm without significant murmur appreciated.  Thrill within left arm loop graft.  Pulsatile bulge in the mid upper arm around the previous incision   Diagnostic Studies Ultrasound imaging was ordered and reviewed.  This shows that the area of concern is a connection between 2 brachial veins, possibly the basilic vein.  The venous outflow is occluded.  Assessment: Pulsatile brachial vein, end-stage renal disease Plan: I have recommended proceeding with a shuntogram to better evaluate the patient's anatomy.  She may be a candidate to identify and other pain in the axilla for the venous anastomosis.  This may help her problem but may not completely resolve the issue.  I scheduled her shuntogram for this Wednesday  V. Leia Alf, M.D. [facility] [place] Office: [phone] Pager:  [phone]

## 2013-11-30 NOTE — Op Note (Signed)
    Patient name: [name] MRN: [id number] DOB: 1975-09-26 Sex: female  11/30/2013 Pre-operative Diagnosis: End-stage renal disease Post-operative diagnosis:  Same Surgeon:  Serafina Mitchell Procedure Performed:  1.  ultrasound-guided access, left upper arm loop graft  2.   shuntogram   Indications:  Complaining of bulging from vein in mid arm  Procedure:  The patient was identified in the holding area and taken to room 8.  The patient was then placed supine on the table and prepped and draped in the usual sterile fashion.  A time out was called.  Ultrasound was used to evaluate the fistula.  The vein was patent and compressible.  A digital ultrasound image was acquired.  The fistula was then accessed under ultrasound guidance using a micropuncture needle.  An 018 wire was then asvanced without resistance and a micropuncture sheath was placed.  Contrast injections were then performed through the sheath  Findings:  The arterial anastomosis is widely patent.  The loop graft is patent.  The venous outflow tract is occluded.  The graft stays patent by retrograde flow in the brachial vein.  There is a tributary to another brachial vein which then gets contrast into the central venous system which is widely patent.     Impression:  #1  occluded venous outflow tract.  The graft remains patent via retrograde filling of the brachial veins which communicate to deeper veins which get blood flow into the central venous system.  #2  I evaluated the axillary incision.  The venous loop of the graft is lateral.  I think that I could jump to the axillary vein, however if the patient can tolerate her current level of symptomatology, I would not recommend this.    Theotis Burrow, M.D. [facility] Office: [phone] Pager:  [phone]

## 2013-11-30 NOTE — Interval H&P Note (Signed)
History and Physical Interval Note:  11/30/2013 10:04 AM  [name]  has presented today for surgery, with the diagnosis of anorizium  The various methods of treatment have been discussed with the patient and family. After consideration of risks, benefits and other options for treatment, the patient has consented to  Procedure(s): SHUNTOGRAM (Left) as a surgical intervention .  The patient's history has been reviewed, patient examined, no change in status, stable for surgery.  I have reviewed the patient's chart and labs.  Questions were answered to the patient's satisfaction.     Serafina Mitchell

## 2013-11-30 NOTE — Discharge Instructions (Signed)
Wound Care Wound care helps prevent pain and infection.  HOME CARE   Only take medicine as told by your doctor.  Clean the wound daily with mild soap and water.  Change any bandages (dressings) as told by your doctor.  Put medicated cream and a bandage on the wound as told by your doctor.  Change the bandage if it gets wet, dirty, or starts to smell.  Take showers. Do not take baths, swim, or do anything that puts your wound under water.  Rest and raise (elevate) the wound until the pain and puffiness (swelling) are better.  Keep all doctor visits as told. GET HELP RIGHT AWAY IF:   Yellowish-white fluid (pus) comes from the wound.  Medicine does not lessen your pain.  There is a red streak going away from the wound.  You have a fever. MAKE SURE YOU:   Understand these instructions.  Will watch your condition.  Will get help right away if you are not doing well or get worse. Document Released: 04/15/2008 Document Revised: 09/29/2011 Document Reviewed: 11/10/2010 [facility] Patient Information 2014 [place], [place].

## 2013-12-23 DIAGNOSIS — Z0279 Encounter for issue of other medical certificate: Secondary | ICD-10-CM

## 2013-12-25 ENCOUNTER — Emergency Department (HOSPITAL_COMMUNITY)
Admission: EM | Admit: 2013-12-25 | Discharge: 2013-12-25 | Disposition: A | Discharge status: Home / Self Care | Payer: Medicare Other | Attending: Emergency Medicine | Admitting: Emergency Medicine

## 2013-12-25 ENCOUNTER — Encounter (HOSPITAL_COMMUNITY): Payer: Self-pay | Admitting: Emergency Medicine

## 2013-12-25 DIAGNOSIS — E119 Type 2 diabetes mellitus without complications: Secondary | ICD-10-CM | POA: Insufficient documentation

## 2013-12-25 DIAGNOSIS — N186 End stage renal disease: Secondary | ICD-10-CM | POA: Insufficient documentation

## 2013-12-25 DIAGNOSIS — E785 Hyperlipidemia, unspecified: Secondary | ICD-10-CM | POA: Insufficient documentation

## 2013-12-25 DIAGNOSIS — IMO0001 Reserved for inherently not codable concepts without codable children: Secondary | ICD-10-CM

## 2013-12-25 DIAGNOSIS — Z79899 Other long term (current) drug therapy: Secondary | ICD-10-CM | POA: Insufficient documentation

## 2013-12-25 DIAGNOSIS — D649 Anemia, unspecified: Secondary | ICD-10-CM | POA: Insufficient documentation

## 2013-12-25 DIAGNOSIS — Z794 Long term (current) use of insulin: Secondary | ICD-10-CM | POA: Insufficient documentation

## 2013-12-25 DIAGNOSIS — Z7982 Long term (current) use of aspirin: Secondary | ICD-10-CM | POA: Insufficient documentation

## 2013-12-25 DIAGNOSIS — E162 Hypoglycemia, unspecified: Secondary | ICD-10-CM

## 2013-12-25 DIAGNOSIS — M129 Arthropathy, unspecified: Secondary | ICD-10-CM | POA: Insufficient documentation

## 2013-12-25 DIAGNOSIS — Z8719 Personal history of other diseases of the digestive system: Secondary | ICD-10-CM | POA: Insufficient documentation

## 2013-12-25 DIAGNOSIS — Z992 Dependence on renal dialysis: Secondary | ICD-10-CM | POA: Insufficient documentation

## 2013-12-25 DIAGNOSIS — I120 Hypertensive chronic kidney disease with stage 5 chronic kidney disease or end stage renal disease: Secondary | ICD-10-CM | POA: Insufficient documentation

## 2013-12-25 LAB — CBC WITH DIFFERENTIAL/PLATELET
Basophils Absolute: 0 10*3/uL (ref 0.0–0.1)
Basophils Relative: 0 % (ref 0–1)
EOS ABS: 0.1 10*3/uL (ref 0.0–0.7)
Eosinophils Relative: 2 % (ref 0–5)
HCT: 38 % (ref 36.0–46.0)
HEMOGLOBIN: 12.8 g/dL (ref 12.0–15.0)
LYMPHS ABS: 0.9 10*3/uL (ref 0.7–4.0)
Lymphocytes Relative: 17 % (ref 12–46)
MCH: 29.4 pg (ref 26.0–34.0)
MCHC: 33.7 g/dL (ref 30.0–36.0)
MCV: 87.2 fL (ref 78.0–100.0)
MONO ABS: 0.1 10*3/uL (ref 0.1–1.0)
MONOS PCT: 2 % — AB (ref 3–12)
Neutro Abs: 4.3 10*3/uL (ref 1.7–7.7)
Neutrophils Relative %: 79 % — ABNORMAL HIGH (ref 43–77)
Platelets: 164 10*3/uL (ref 150–400)
RBC: 4.36 MIL/uL (ref 3.87–5.11)
RDW: 13.8 % (ref 11.5–15.5)
WBC: 5.4 10*3/uL (ref 4.0–10.5)

## 2013-12-25 LAB — COMPREHENSIVE METABOLIC PANEL
ALT: 10 U/L (ref 0–35)
AST: 13 U/L (ref 0–37)
Albumin: 4.5 g/dL (ref 3.5–5.2)
Alkaline Phosphatase: 99 U/L (ref 39–117)
BUN: 21 mg/dL (ref 6–23)
CALCIUM: 9.7 mg/dL (ref 8.4–10.5)
CO2: 28 mEq/L (ref 19–32)
CREATININE: 5.92 mg/dL — AB (ref 0.50–1.10)
Chloride: 100 mEq/L (ref 96–112)
GFR calc non Af Amer: 8 mL/min — ABNORMAL LOW (ref >90)
GFR, EST AFRICAN AMERICAN: 10 mL/min — AB (ref >90)
GLUCOSE: 61 mg/dL — AB (ref 70–99)
Potassium: 3.8 mEq/L (ref 3.7–5.3)
Sodium: 144 mEq/L (ref 137–147)
TOTAL PROTEIN: 8.1 g/dL (ref 6.0–8.3)
Total Bilirubin: 0.5 mg/dL (ref 0.3–1.2)

## 2013-12-25 LAB — CBG MONITORING, ED
GLUCOSE-CAPILLARY: 74 mg/dL (ref 70–99)
Glucose-Capillary: 82 mg/dL (ref 70–99)
Glucose-Capillary: 108 mg/dL — ABNORMAL HIGH (ref 70–99)

## 2013-12-25 LAB — LIPASE, BLOOD: LIPASE: 22 U/L (ref 11–59)

## 2013-12-25 MED ORDER — SODIUM CHLORIDE 0.9 % IV BOLUS (SEPSIS)
500.0000 mL | Freq: Once | INTRAVENOUS | Status: AC
Start: 2013-12-25 — End: 2013-12-25
  Administered 2013-12-25: 500 mL via INTRAVENOUS

## 2013-12-25 MED ORDER — ONDANSETRON HCL 4 MG/2ML IJ SOLN
4.0000 mg | Freq: Once | INTRAMUSCULAR | Status: AC
  Administered 2013-12-25: 4 mg via INTRAVENOUS
  Filled 2013-12-25: qty 2

## 2013-12-25 NOTE — ED Provider Notes (Signed)
CSN: [id number]     Arrival date & time 12/25/13  0710 History   First MD Initiated Contact with Patient 12/25/13 0715     Chief Complaint  Patient presents with  . Hypoglycemia     (Consider location/radiation/quality/duration/timing/severity/associated sxs/prior Treatment) HPI 38 y.o. Female c.o. Hypoglycemia.  Patient unable to give history as amnestic for complete event.  She is an iddm.  She took her night time lantus insulin but fell asleep before having her usual bedtime snack.  Her alarm was set for 0600.Marland Kitchen Her 84 y.o. Daughter heard alarm go off but was unable to awaken her and ems was called.  She reports awakening to find her self in ambulance.  She currently has a headache and nausea.  She denies numbnes, tingling, weakness, or abdominal pain.  She states she has had some difficulty with bs in the past more frequently over the past few months.  She is on dialysis.  Past Medical History  Diagnosis Date  . Chronic kidney disease   . Diabetes mellitus   . Hypertension   . Hyperlipidemia   . Anemia   . GERD (gastroesophageal reflux disease)   . Arthritis   . History of blood transfusion    Past Surgical History  Procedure Laterality Date  . Arteriovenous graft placement    . Cesarean section    . Eye surgery      [facility]zer  . Artery repair Left 12/10/2012    Procedure: BRACHIAL ARTERY REPAIR;  Surgeon: Angelia Mould, MD;  Location: [facility] OR;  Service: Vascular;  Laterality: Left;  Exploration Left Brachial Artery for AVF   Family History  Problem Relation Age of Onset  . Cancer Mother     breast  . Hypertension Father    History  Substance Use Topics  . Smoking status: Never Smoker   . Smokeless tobacco: Never Used  . Alcohol Use: No   OB History   Grav Para Term Preterm Abortions TAB SAB Ect Mult Living                 Review of Systems  All other systems reviewed and are negative.     Allergies  Review of patient's allergies indicates no known  allergies.  Home Medications   Prior to Admission medications   Medication Sig Start Date End Date Taking? Authorizing Provider  albuterol (PROVENTIL HFA;VENTOLIN HFA) 108 (90 BASE) MCG/ACT inhaler Inhale 2 puffs into the lungs 2 (two) times daily.    Historical Provider, MD  amLODipine (NORVASC) 10 MG tablet Take 10 mg by mouth daily.     Historical Provider, MD  aspirin 81 MG tablet Take 81 mg by mouth daily.      Historical Provider, MD  calcium acetate (PHOSLO) 667 MG capsule Take 667 mg by mouth 3 (three) times daily with meals.      Historical Provider, MD  Cetirizine-Pseudoephedrine (ZYRTEC-D PO) Take 1 tablet by mouth daily as needed (for allergies).     Historical Provider, MD  GLUCAGON EMERGENCY 1 MG injection Inject 1 mg into the muscle once as needed (for severe low blood sugar).  11/22/12   Historical Provider, MD  glucose 4 GM chewable tablet Chew 16 g by mouth as needed for low blood sugar.    Historical Provider, MD  insulin glargine (LANTUS) 100 UNIT/ML injection Inject 10 Units into the skin at bedtime.     Historical Provider, MD  insulin lispro (HUMALOG) 100 UNIT/ML injection Inject 3-15 Units into the skin  3 (three) times daily before meals. Per sliding scale    Historical Provider, MD  labetalol (NORMODYNE) 300 MG tablet Take 600 mg by mouth 3 (three) times daily.     Historical Provider, MD  medroxyPROGESTERone (DEPO-PROVERA) 150 MG/ML injection Inject 150 mg into the muscle every 3 (three) months.      Historical Provider, MD  Pediatric Multiple Vit-C-FA (FLINSTONES GUMMIES OMEGA-3 DHA) CHEW Chew 1 tablet by mouth daily.     Historical Provider, MD  rosuvastatin (CRESTOR) 10 MG tablet Take 10 mg by mouth daily.     Historical Provider, MD   BP 171/95  Temp(Src) 97.2 F (36.2 C) (Temporal)  Resp 12  SpO2 100%  LMP 11/30/2004 Physical Exam  Nursing note and vitals reviewed. Constitutional: She is oriented to person, place, and time. She appears well-developed and  well-nourished.  HENT:  Head: Normocephalic and atraumatic.  Right Ear: External ear normal.  Left Ear: External ear normal.  Nose: Nose normal.  Mouth/Throat: Oropharynx is clear and moist.  Eyes: Conjunctivae and EOM are normal. Pupils are equal, round, and reactive to light.  Neck: Normal range of motion. Neck supple.  Cardiovascular: Normal rate, regular rhythm, normal heart sounds and intact distal pulses.   Pulmonary/Chest: Effort normal and breath sounds normal.  Abdominal: Soft. Bowel sounds are normal.  Musculoskeletal: Normal range of motion.  Neurological: She is alert and oriented to person, place, and time. She has normal reflexes.  Skin: Skin is warm and dry.  Psychiatric: She has a normal mood and affect. Her behavior is normal. Judgment and thought content normal.    ED Course  Procedures (including critical care time) Labs Review Labs Reviewed  CBC WITH DIFFERENTIAL  COMPREHENSIVE METABOLIC PANEL  LIPASE, BLOOD  URINALYSIS, ROUTINE W REFLEX MICROSCOPIC  PREGNANCY, URINE  CBG MONITORING, ED  CBG MONITORING, ED    Imaging Review No results found.   EKG Interpretation None      MDM   Final diagnoses:  Hypoglycemia  IDDM (insulin dependent diabetes mellitus)   Patient's bs 80s.  Taking po and no vomiting.  Plan recheck. 9:35 AM  Patient continues to take po well and bs 108.  Discussed need to continue to monitor bs and take po.  She states adults at home to help.  She will decrease her basal insulin by one half and followup with her pmd this week.   Shaune Pollack, MD 12/26/13 1010

## 2013-12-25 NOTE — ED Notes (Signed)
Lab at bedside attempting to draw.

## 2013-12-25 NOTE — Discharge Instructions (Signed)
Decrease your base insulin by one half for 24 hours.  Recheck with your doctor this week.   Low Blood Sugar Low blood sugar (hypoglycemia) means that the level of sugar in your blood is lower than it should be. Signs of low blood sugar include:  Getting sweaty.  Feeling hungry.  Feeling dizzy or weak.  Feeling sleepier than normal.  Feeling nervous.  Headaches.  Having a fast heartbeat. Low blood sugar can happen fast and can be an emergency. Your doctor can do tests to check your blood sugar level. You can have low blood sugar and not have diabetes. HOME CARE  Check your blood sugar as told by your doctor. If it is less than 70 mg/dl or as told by your doctor, take 1 of the following:  3 to 4 glucose tablets.   cup clear juice.   cup soda pop, not diet.  1 cup milk.  5 to 6 hard candies.  Recheck blood sugar after 15 minutes. Repeat until it is at the right level.  Eat a snack if it is more than 1 hour until the next meal.  Only take medicine as told by your doctor.  Do not skip meals. Eat on time.  Do not drink alcohol except with meals.  Check your blood glucose before driving.  Check your blood glucose before and after exercise.  Always carry treatment with you, such as glucose pills.  Always wear a medical alert bracelet if you have diabetes. GET HELP RIGHT AWAY IF:   Your blood glucose goes below 70 mg/dl or as told by your doctor, and you:  Are confused.  Are not able to swallow.  Pass out (faint).  You cannot treat yourself. You may need someone to help you.  You have low blood sugar problems often.  You have problems from your medicines.  You are not feeling better after 3 to 4 days.  You have vision changes. MAKE SURE YOU:   Understand these instructions.  Will watch this condition.  Will get help right away if you are not doing well or get worse. Document Released: 10/01/2009 Document Revised: 09/29/2011 Document Reviewed:  10/01/2009 [facility] Patient Information 2014 [place], [place].

## 2013-12-25 NOTE — ED Notes (Signed)
Pt presents to department via [facility] for evaluation of hypoglycemia. Family found her unresponsive in bed this morning. States she took insulin last night without eating. CBG 35 initially. 22g R hand, received D50 per EMS. Pt is alert and oriented x4. Tuesday, Thursday, Saturday HD patient.

## 2013-12-25 NOTE — ED Notes (Signed)
Pt states hypoglycemia this morning, states she took insulin last night and fell asleep before she could eat snack. Family found her unresponsive in bed this morning. She is alert and oriented x4 at the time. States she feels lightheaded, but denies pain.

## 2013-12-25 NOTE — ED Notes (Signed)
Pt unable to void at the time. Drinking apple juice and eating graham crackers.

## 2013-12-29 ENCOUNTER — Encounter (HOSPITAL_COMMUNITY): Payer: Self-pay | Admitting: Emergency Medicine

## 2013-12-29 ENCOUNTER — Emergency Department (HOSPITAL_COMMUNITY)
Admission: EM | Admit: 2013-12-29 | Discharge: 2013-12-29 | Disposition: A | Discharge status: Home / Self Care | Payer: Medicare Other | Attending: Emergency Medicine | Admitting: Emergency Medicine

## 2013-12-29 DIAGNOSIS — Z8719 Personal history of other diseases of the digestive system: Secondary | ICD-10-CM | POA: Diagnosis not present

## 2013-12-29 DIAGNOSIS — M129 Arthropathy, unspecified: Secondary | ICD-10-CM | POA: Diagnosis not present

## 2013-12-29 DIAGNOSIS — E119 Type 2 diabetes mellitus without complications: Secondary | ICD-10-CM | POA: Diagnosis not present

## 2013-12-29 DIAGNOSIS — N189 Chronic kidney disease, unspecified: Secondary | ICD-10-CM | POA: Diagnosis not present

## 2013-12-29 DIAGNOSIS — Z862 Personal history of diseases of the blood and blood-forming organs and certain disorders involving the immune mechanism: Secondary | ICD-10-CM | POA: Insufficient documentation

## 2013-12-29 DIAGNOSIS — M79609 Pain in unspecified limb: Secondary | ICD-10-CM | POA: Insufficient documentation

## 2013-12-29 DIAGNOSIS — R739 Hyperglycemia, unspecified: Secondary | ICD-10-CM

## 2013-12-29 DIAGNOSIS — Z79899 Other long term (current) drug therapy: Secondary | ICD-10-CM | POA: Insufficient documentation

## 2013-12-29 DIAGNOSIS — E162 Hypoglycemia, unspecified: Secondary | ICD-10-CM

## 2013-12-29 DIAGNOSIS — Z794 Long term (current) use of insulin: Secondary | ICD-10-CM | POA: Diagnosis not present

## 2013-12-29 DIAGNOSIS — Z7982 Long term (current) use of aspirin: Secondary | ICD-10-CM | POA: Diagnosis not present

## 2013-12-29 DIAGNOSIS — M79605 Pain in left leg: Secondary | ICD-10-CM

## 2013-12-29 DIAGNOSIS — I129 Hypertensive chronic kidney disease with stage 1 through stage 4 chronic kidney disease, or unspecified chronic kidney disease: Secondary | ICD-10-CM | POA: Insufficient documentation

## 2013-12-29 DIAGNOSIS — E1169 Type 2 diabetes mellitus with other specified complication: Secondary | ICD-10-CM | POA: Insufficient documentation

## 2013-12-29 LAB — HEPATIC FUNCTION PANEL
ALBUMIN: 4.7 g/dL (ref 3.5–5.2)
ALK PHOS: 149 U/L — AB (ref 39–117)
ALT: 48 U/L — ABNORMAL HIGH (ref 0–35)
AST: 28 U/L (ref 0–37)
Bilirubin, Direct: <0.2 mg/dL (ref 0.0–0.3)
TOTAL PROTEIN: 8.7 g/dL — AB (ref 6.0–8.3)
Total Bilirubin: 0.5 mg/dL (ref 0.3–1.2)

## 2013-12-29 LAB — BASIC METABOLIC PANEL
BUN: 48 mg/dL — ABNORMAL HIGH (ref 6–23)
CO2: 24 meq/L (ref 19–32)
Calcium: 10.3 mg/dL (ref 8.4–10.5)
Chloride: 86 mEq/L — ABNORMAL LOW (ref 96–112)
Creatinine, Ser: 9.95 mg/dL — ABNORMAL HIGH (ref 0.50–1.10)
GFR calc Af Amer: 5 mL/min — ABNORMAL LOW (ref >90)
GFR calc non Af Amer: 4 mL/min — ABNORMAL LOW (ref >90)
Glucose, Bld: 244 mg/dL — ABNORMAL HIGH (ref 70–99)
Potassium: 4 mEq/L (ref 3.7–5.3)
SODIUM: 134 meq/L — AB (ref 137–147)

## 2013-12-29 LAB — CBG MONITORING, ED
GLUCOSE-CAPILLARY: 18 mg/dL — AB (ref 70–99)
Glucose-Capillary: 358 mg/dL — ABNORMAL HIGH (ref 70–99)
Glucose-Capillary: 31 mg/dL — CL (ref 70–99)
Glucose-Capillary: 224 mg/dL — ABNORMAL HIGH (ref 70–99)
Glucose-Capillary: 188 mg/dL — ABNORMAL HIGH (ref 70–99)
Glucose-Capillary: 185 mg/dL — ABNORMAL HIGH (ref 70–99)

## 2013-12-29 LAB — CBC WITH DIFFERENTIAL/PLATELET
Basophils Absolute: 0 10*3/uL (ref 0.0–0.1)
Basophils Relative: 0 % (ref 0–1)
Eosinophils Absolute: 0.3 10*3/uL (ref 0.0–0.7)
Eosinophils Relative: 5 % (ref 0–5)
HCT: 34.2 % — ABNORMAL LOW (ref 36.0–46.0)
Hemoglobin: 11.8 g/dL — ABNORMAL LOW (ref 12.0–15.0)
LYMPHS ABS: 1.3 10*3/uL (ref 0.7–4.0)
LYMPHS PCT: 22 % (ref 12–46)
MCH: 28.6 pg (ref 26.0–34.0)
MCHC: 34.5 g/dL (ref 30.0–36.0)
MCV: 82.8 fL (ref 78.0–100.0)
Monocytes Absolute: 0.2 10*3/uL (ref 0.1–1.0)
Monocytes Relative: 3 % (ref 3–12)
Neutro Abs: 4 10*3/uL (ref 1.7–7.7)
Neutrophils Relative %: 70 % (ref 43–77)
PLATELETS: 156 10*3/uL (ref 150–400)
RBC: 4.13 MIL/uL (ref 3.87–5.11)
RDW: 13.1 % (ref 11.5–15.5)
WBC: 5.9 10*3/uL (ref 4.0–10.5)

## 2013-12-29 LAB — CK: Total CK: 189 U/L — ABNORMAL HIGH (ref 7–177)

## 2013-12-29 LAB — PHOSPHORUS: PHOSPHORUS: 4.8 mg/dL — AB (ref 2.3–4.6)

## 2013-12-29 LAB — MAGNESIUM: Magnesium: 2.4 mg/dL (ref 1.5–2.5)

## 2013-12-29 LAB — HCG, SERUM, QUALITATIVE: PREG SERUM: NEGATIVE

## 2013-12-29 MED ORDER — PROMETHAZINE HCL 25 MG/ML IJ SOLN
25.0000 mg | INTRAMUSCULAR | Status: DC | PRN
  Administered 2013-12-29: 25 mg via INTRAVENOUS
  Filled 2013-12-29: qty 1

## 2013-12-29 MED ORDER — INSULIN ASPART 100 UNIT/ML ~~LOC~~ SOLN
10.0000 [IU] | Freq: Once | SUBCUTANEOUS | Status: AC
  Administered 2013-12-29: 10 [IU] via INTRAVENOUS
  Filled 2013-12-29: qty 1

## 2013-12-29 MED ORDER — DEXTROSE 50 % IV SOLN
INTRAVENOUS | Status: AC
  Filled 2013-12-29: qty 50

## 2013-12-29 MED ORDER — DEXTROSE 50 % IV SOLN: 1.0000 | Freq: Once | INTRAVENOUS | Status: DC

## 2013-12-29 MED ORDER — DEXTROSE 50 % IV SOLN
1.0000 | Freq: Once | INTRAVENOUS | Status: AC
  Administered 2013-12-29: 50 mL via INTRAVENOUS
  Filled 2013-12-29: qty 50

## 2013-12-29 MED ORDER — SODIUM CHLORIDE 0.9 % IV BOLUS (SEPSIS)
500.0000 mL | Freq: Once | INTRAVENOUS | Status: AC
  Administered 2013-12-29: 500 mL via INTRAVENOUS

## 2013-12-29 NOTE — ED Notes (Signed)
38 yo female via EMS from Dialysis center (Tx was not yet initiated) c/o left leg "shakes and unsteady gait." reports starting last night at 10pm. Pain is 5/10 and shaking only occurs with ambulation. Pedal pulse is strong and regular. CBG 513 pt reports taking 10 units humalog this a.m 0800. Has N/V when trying to sip water. Vitals are stable, pt is a/o x3

## 2013-12-29 NOTE — ED Notes (Signed)
pts CBG 185 reported to Dr. Leonides Schanz

## 2013-12-29 NOTE — ED Notes (Signed)
MD at bedside. 

## 2013-12-29 NOTE — ED Notes (Signed)
Pt eating [place] sandwich w/ mustard and drinking apple juice

## 2013-12-29 NOTE — ED Notes (Signed)
Pt received apple juice with 1 packet of sugar.

## 2013-12-29 NOTE — ED Notes (Signed)
Graft Positive for bruit and thrill

## 2013-12-29 NOTE — ED Notes (Signed)
Dr.Ward was informed of patient not being able to make urine.

## 2013-12-29 NOTE — ED Notes (Signed)
Removed pts IV.  Patient is getting dressed and at bedside waiting for discharge paperwork from nurse.

## 2013-12-29 NOTE — ED Provider Notes (Signed)
TIME SEEN: 9:30 AM  CHIEF COMPLAINT: Left leg shaking  HPI: Patient is a 38 y.o. F with history of hypertension, diabetes, hyperlipidemia, chronic kidney disease on hemodialysis on Tuesday, Thursday and Saturday (last dialyzed on Tuesday, 2 days ago) who presents emergency department with left lower extremity shaking that started last night. She denies that she has had any injury to the leg but states she has had some "squeezing" pain in her anterior thigh is gone currently. No injury to the leg. No new back pain. No numbness or focal weakness. No headache. No head injury. No bowel or bladder incontinence.  ROS: See HPI Constitutional: no fever  Eyes: no drainage  ENT: no runny nose   Cardiovascular:  no chest pain  Resp: no SOB  GI: no vomiting GU: no dysuria Integumentary: no rash  Allergy: no hives  Musculoskeletal: no leg swelling  Neurological: no slurred speech ROS otherwise negative  PAST MEDICAL HISTORY/PAST SURGICAL HISTORY:  Past Medical History  Diagnosis Date  . Chronic kidney disease   . Diabetes mellitus   . Hypertension   . Hyperlipidemia   . Anemia   . GERD (gastroesophageal reflux disease)   . Arthritis   . History of blood transfusion     MEDICATIONS:  Prior to Admission medications   Medication Sig Start Date End Date Taking? Authorizing Provider  albuterol (PROVENTIL HFA;VENTOLIN HFA) 108 (90 BASE) MCG/ACT inhaler Inhale 2 puffs into the lungs every 6 (six) hours as needed for wheezing or shortness of breath.     Historical Provider, MD  amLODipine (NORVASC) 10 MG tablet Take 10 mg by mouth daily.     Historical Provider, MD  aspirin 81 MG tablet Take 81 mg by mouth daily.      Historical Provider, MD  calcium acetate (PHOSLO) 667 MG capsule Take 667 mg by mouth 3 (three) times daily with meals.      Historical Provider, MD  Cetirizine-Pseudoephedrine (ZYRTEC-D PO) Take 1 tablet by mouth daily as needed (for allergies).     Historical Provider, MD   GLUCAGON EMERGENCY 1 MG injection Inject 1 mg into the muscle once as needed (for severe low blood sugar).  11/22/12   Historical Provider, MD  glucose 4 GM chewable tablet Chew 16 g by mouth as needed for low blood sugar.    Historical Provider, MD  insulin glargine (LANTUS) 100 UNIT/ML injection Inject 10 Units into the skin at bedtime.     Historical Provider, MD  insulin lispro (HUMALOG) 100 UNIT/ML injection Inject 3-15 Units into the skin 3 (three) times daily before meals. Per sliding scale    Historical Provider, MD  labetalol (NORMODYNE) 300 MG tablet Take 600 mg by mouth 3 (three) times daily.     Historical Provider, MD  medroxyPROGESTERone (DEPO-PROVERA) 150 MG/ML injection Inject 150 mg into the muscle every 3 (three) months.      Historical Provider, MD  Pediatric Multiple Vit-C-FA (FLINSTONES GUMMIES OMEGA-3 DHA) CHEW Chew 1 tablet by mouth daily.     Historical Provider, MD  rosuvastatin (CRESTOR) 10 MG tablet Take 10 mg by mouth daily.     Historical Provider, MD    ALLERGIES:  Allergies  Allergen Reactions  . Pollen Extract     SOCIAL HISTORY:  History  Substance Use Topics  . Smoking status: Never Smoker   . Smokeless tobacco: Never Used  . Alcohol Use: No    FAMILY HISTORY: Family History  Problem Relation Age of Onset  . Cancer Mother  breast  . Hypertension Father     EXAM: BP 151/82  Pulse 87  Temp(Src) 98.1 F (36.7 C) (Oral)  Resp 18  Ht 5\' 2"  (1.575 m)  Wt 114 lb 10.2 oz (52 kg)  BMI 20.96 kg/m2  SpO2 100% CONSTITUTIONAL: Alert and oriented and responds appropriately to questions. Well-appearing; well-nourished HEAD: Normocephalic EYES: Conjunctivae clear, PERRL ENT: normal nose; no rhinorrhea; moist mucous membranes; pharynx without lesions noted NECK: Supple, no meningismus, no LAD  CARD: RRR; S1 and S2 appreciated; no murmurs, no clicks, no rubs, no gallops RESP: Normal chest excursion without splinting or tachypnea; breath sounds clear  and equal bilaterally; no wheezes, no rhonchi, no rales,  ABD/GI: Normal bowel sounds; non-distended; soft, non-tender, no rebound, no guarding BACK:  The back appears normal and is non-tender to palpation, there is no CVA tenderness; no midline spinal tenderness or step-off or deformity EXT: Patient's left lower extremity is completely nontender to palpation with no swelling, she has 2+ DP pulses bilaterally, sensation to light touch intact diffusely, no joint effusion, no ligamentous laxity in any joints, Normal ROM in all joints; non-tender to palpation; no edema; normal capillary refill; no cyanosis    SKIN: Normal color for age and race; warm NEURO: Moves all extremities equally; sensation to light touch intact diffusely, 2+ deep tendon reflexes in bilateral upper lower extremities, no clonus, patient is able to ambulate after several steps will begin having shaking in her left lower extremity that is diffuse but does not appear to be seizure-like activity, normal gait PSYCH: The patient's mood and manner are appropriate. Grooming and personal hygiene are appropriate.  MEDICAL DECISION MAKING: Patient here with left lower extremity shaking. Differential diagnosis includes electrolyte abnormality, maybe secondary to hyperglycemia. Doubt any intracranial pathology or radiculopathy given she has had no other neurologic deficits, headache or head injury, back pain. We'll give IV fluids and IV insulin as her glucoses greater than 380. Will obtain labs.  ED PROGRESS: Patient is begin vomiting. We'll give IV Phenergan per her request. It appears this is chronic for her and likely secondary to gastroparesis. She denies abdominal pain, fever or diarrhea.   11:41 AM  Seen by Amalia Hailey PA with nephrology has seen pt.  Patient has sodium 134, chloride 86, BUN 48 creatinine 9.95. Her glucose has decreased after insulin to 244.   Magnesium normal. CK slightly elevated at 189.  Phosphorus is slightly elevated  at 4.8. Nephrology does not feel she needs emergent dialysis and do not feel that this is contributing to her symptoms. Nephrology wants patient to call her dialysis center to scheduled dialysis treatment tomorrow. She agrees with this plan. Patient states she is feeling better.  12:00 PM  Pt's repeat glucose is low at 31. Will give patient by mouth challenge and one amp D50 and continue to closely monitor. She is still awake, alert. We'll also ambulate patient.  2:00 PM  Pt's lupus is back down to 18 after 1 amp D50. She was given another 2 amps of D50 and is now awake and eating. Her repeat glucose is 224. We'll continue to monitor. She's been able to ambulate without difficulty and has no difficulty walking. No further left leg shaking. Plan is for her to followup with her dialysis center tomorrow for outpatient dialysis.  3:34 PM  Pt's glucose was 224 then went slightly down to 188.  Will recheck at 4pm.  Patient has been drinking juice and eating crackers, tolerating by mouth without difficulty. States  she is ready for discharge home.  4:14 PM  Pt's glucose is 185. Given it is stable and she is feeling well and no longer has difficulty ambulating, will discharge home with strict return precautions and supportive care instructions. Patient verbalizes understanding and is comfortable with plan. She is comfortable with plan to scheduled dialysis tomorrow as an outpatient.  [place], DO 12/29/13 1615

## 2013-12-29 NOTE — ED Notes (Signed)
Called to patients room for a hypoglycemic event. Patient was unresponsive, diaphoretic and CBG was 18. Patient was given D50 2amps and she became responsive but still sleepy. EDP ad bedside. Patient is being encouraged to take Po's to keep her CBG elevated.

## 2013-12-29 NOTE — ED Notes (Signed)
CBG: 18

## 2013-12-29 NOTE — ED Notes (Signed)
MD at bedside. Renal

## 2013-12-29 NOTE — Discharge Instructions (Signed)
You were seen in the emergency department for left leg shaking. We are not sure what is caused her symptoms but they have resolved. It may have been due to your blood glucose being elevated. The nephrology team has seen and feels your safe to be discharged him to recommend that you call your dialysis center tonight or first thing tomorrow morning to schedule dialysis tomorrow as an outpatient. You do not need dialysis emergently today.   Glucose, Blood Sugar, Fasting Blood Sugar This is a test to measure your blood sugar. Glucose is a simple sugar that serves as the main source of energy for the body. The carbohydrates we eat are broken down into glucose (and a few other simple sugars), absorbed by the small intestine, and circulated throughout the body. Most of the body's cells require glucose for energy production; brain and nervous system cells not only rely on glucose for energy, they can only function when glucose levels in the blood remain above a certain level.  The body's use of glucose hinges on the availability of insulin, a hormone produced by the pancreas. Insulin acts as a Control and instrumentation engineer, transporting glucose into the body's cells, directing the body to store excess glucose as glycogen (for short-term storage) and/or as triglycerides in fat cells. We can not live without glucose or insulin, and they must be in balance.  Normally, blood glucose levels rise slightly after a meal, and insulin is secreted to lower them, with the amount of insulin released matched up with the size and content of the meal. If blood glucose levels drop too low, such as might occur in between meals or after a strenuous workout, glucagon (another pancreatic hormone) is secreted to tell the liver to turn some glycogen back into glucose, raising the blood glucose levels. If the glucose/insulin feedback mechanism is working properly, the amount of glucose in the blood remains fairly stable. If the balance is disrupted and  glucose levels in the blood rise, then the body tries to restore the balance, both by increasing insulin production and by excreting glucose in the urine.  PREPARATION FOR TEST A blood sample drawn from a vein in your arm or, for a self check, a drop of blood from a skin prick; in general, it may be recommended that you fast before having a blood glucose test; sometimes a random (no preparation) urine sample is used. Your caregiver will instruct you as to what they want prior to your testing. NORMAL FINDINGS Normal values depend on many factors. Your lab will provide a range of normal values with your test results. The following information summarizes the meaning of the test results. These are based on the clinical practice recommendations of the American Diabetes Association.  FASTING BLOOD GLUCOSE  From 70 to 99 mg/dL (3.9 to 5.5 mmol/L): Normal glucose tolerance  From 100 to 125 mg/dL (5.6 to 6.9 mmol/L):Impaired fasting glucose (pre-diabetes)  126 mg/dL (7.0 mmol/L) and above on more than one testing occasion: Diabetes ORAL GLUCOSE TOLERANCE TEST (OGTT) [EXCEPT PREGNANCY] (2 HOURS AFTER A 75-GRAM GLUCOSE DRINK)  Less than 140 mg/dL (7.8 mmol/L): Normal glucose tolerance  From 140 to 200 mg/dL (7.8 to 11.1 mmol/L): Impaired glucose tolerance (pre-diabetes)  Over 200 mg/dL (11.1 mmol/L) on more than one testing occasion: Diabetes GESTATIONAL DIABETES SCREENING: GLUCOSE CHALLENGE TEST (1 HOUR AFTER A 50-GRAM GLUCOSE DRINK)  Less than 140* mg/dL (7.8 mmol/L): Normal glucose tolerance  140* mg/dL (7.8 mmol/L) and over: Abnormal, needs OGTT (see below) * Some use  a cutoff of More Than 130 mg/dL (7.2 mmol/L) because that identifies 90% of women with gestational diabetes, compared to 80% identified using the threshold of More Than 140 mg/dL (7.8 mmol/L). GESTATIONAL DIABETES DIAGNOSTIC: OGTT (100-GRAM GLUCOSE DRINK)  Fasting*..........................................95 mg/dL (5.3 mmol/L)  1  hour after glucose load*..............180 mg/dL (10.0 mmol/L)  2 hours after glucose load*.............155 mg/dL (8.6 mmol/L)  3 hours after glucose load* **.........140 mg/dL (7.8 mmol/L) * If two or more values are above the criteria, gestational diabetes is diagnosed. ** A 75-gram glucose load may be used, although this method is not as well validated as the 100-gram OGTT; the 3-hour sample is not drawn if 75 grams is used.  Ranges for normal findings may vary among different laboratories and hospitals. You should always check with your doctor after having lab work or other tests done to discuss the meaning of your test results and whether your values are considered within normal limits. MEANING OF TEST  Your caregiver will go over the test results with you and discuss the importance and meaning of your results, as well as treatment options and the need for additional tests if necessary. OBTAINING THE TEST RESULTS It is your responsibility to obtain your test results. Ask the lab or department performing the test when and how you will get your results. Document Released: 08/08/2004 Document Revised: 09/29/2011 Document Reviewed: 06/17/2008 [facility] Patient Information 2014 [place], [place].  Hyperglycemia Hyperglycemia occurs when the glucose (sugar) in your blood is too high. Hyperglycemia can happen for many reasons, but it most often happens to people who do not know they have diabetes or are not managing their diabetes properly.  CAUSES  Whether you have diabetes or not, there are other causes of hyperglycemia. Hyperglycemia can occur when you have diabetes, but it can also occur in other situations that you might not be as aware of, such as: Diabetes  If you have diabetes and are having problems controlling your blood glucose, hyperglycemia could occur because of some of the following reasons:  Not following your meal plan.  Not taking your diabetes medications or not taking it  properly.  Exercising less or doing less activity than you normally do.  Being sick. Pre-diabetes  This cannot be ignored. Before people develop Type 2 diabetes, they almost always have "pre-diabetes." This is when your blood glucose levels are higher than normal, but not yet high enough to be diagnosed as diabetes. Research has shown that some long-term damage to the body, especially the heart and circulatory system, may already be occurring during pre-diabetes. If you take action to manage your blood glucose when you have pre-diabetes, you may delay or prevent Type 2 diabetes from developing. Stress  If you have diabetes, you may be "diet" controlled or on oral medications or insulin to control your diabetes. However, you may find that your blood glucose is higher than usual in the hospital whether you have diabetes or not. This is often referred to as "stress hyperglycemia." Stress can elevate your blood glucose. This happens because of hormones put out by the body during times of stress. If stress has been the cause of your high blood glucose, it can be followed regularly by your caregiver. That way he/she can make sure your hyperglycemia does not continue to get worse or progress to diabetes. Steroids  Steroids are medications that act on the infection fighting system (immune system) to block inflammation or infection. One side effect can be a rise in blood glucose. Most people  can produce enough extra insulin to allow for this rise, but for those who cannot, steroids make blood glucose levels go even higher. It is not unusual for steroid treatments to "uncover" diabetes that is developing. It is not always possible to determine if the hyperglycemia will go away after the steroids are stopped. A special blood test called an A1c is sometimes done to determine if your blood glucose was elevated before the steroids were started. SYMPTOMS  Thirsty.  Frequent urination.  Dry mouth.  Blurred  vision.  Tired or fatigue.  Weakness.  Sleepy.  Tingling in feet or leg. DIAGNOSIS  Diagnosis is made by monitoring blood glucose in one or all of the following ways:  A1c test. This is a chemical found in your blood.  Fingerstick blood glucose monitoring.  Laboratory results. TREATMENT  First, knowing the cause of the hyperglycemia is important before the hyperglycemia can be treated. Treatment may include, but is not be limited to:  Education.  Change or adjustment in medications.  Change or adjustment in meal plan.  Treatment for an illness, infection, etc.  More frequent blood glucose monitoring.  Change in exercise plan.  Decreasing or stopping steroids.  Lifestyle changes. HOME CARE INSTRUCTIONS   Test your blood glucose as directed.  Exercise regularly. Your caregiver will give you instructions about exercise. Pre-diabetes or diabetes which comes on with stress is helped by exercising.  Eat wholesome, balanced meals. Eat often and at regular, fixed times. Your caregiver or nutritionist will give you a meal plan to guide your sugar intake.  Being at an ideal weight is important. If needed, losing as little as 10 to 15 pounds may help improve blood glucose levels. SEEK MEDICAL CARE IF:   You have questions about medicine, activity, or diet.  You continue to have symptoms (problems such as increased thirst, urination, or weight gain). SEEK IMMEDIATE MEDICAL CARE IF:   You are vomiting or have diarrhea.  Your breath smells fruity.  You are breathing faster or slower.  You are very sleepy or incoherent.  You have numbness, tingling, or pain in your feet or hands.  You have chest pain.  Your symptoms get worse even though you have been following your caregiver's orders.  If you have any other questions or concerns. Document Released: 12/31/2000 Document Revised: 09/29/2011 Document Reviewed: 11/03/2011 [facility] Patient Information 2014 [place],  [place].  Hypoglycemia (Low Blood Sugar) Hypoglycemia is when the glucose (sugar) in your blood is too low. Hypoglycemia can happen for many reasons. It can happen to people with or without diabetes. Hypoglycemia can develop quickly and can be a medical emergency.  CAUSES  Having hypoglycemia does not mean that you will develop diabetes. Different causes include:  Missed or delayed meals or not enough carbohydrates eaten.  Medication overdose. This could be by accident or deliberate. If by accident, your medication may need to be adjusted or changed.  Exercise or increased activity without adjustments in carbohydrates or medications.  A nerve disorder that affects body functions like your heart rate, blood pressure and digestion (autonomic neuropathy).  A condition where the stomach muscles do not function properly (gastroparesis). Therefore, medications may not absorb properly.  The inability to recognize the signs of hypoglycemia (hypoglycemic unawareness).  Absorption of insulin  may be altered.  Alcohol consumption.  Pregnancy/menstrual cycles/postpartum. This may be due to hormones.  Certain kinds of tumors. This is very rare. SYMPTOMS   Sweating.  Hunger.  Dizziness.  Blurred vision.  Drowsiness.  Weakness.  Headache.  Rapid heart beat.  Shakiness.  Nervousness. DIAGNOSIS  Diagnosis is made by monitoring blood glucose in one or all of the following ways:  Fingerstick blood glucose monitoring.  Laboratory results. TREATMENT  If you think your blood glucose is low:  Check your blood glucose, if possible. If it is less than 70 mg/dl, take one of the following:  3-4 glucose tablets.   cup juice (prefer clear like apple).   cup "regular" soda pop.  1 cup milk.  -1 tube of glucose gel.  5-6 hard candies.  Do not over treat because your blood glucose (sugar) will only go too high.  Wait 15 minutes and recheck your blood glucose. If it is still  less than 70 mg/dl (or below your target range), repeat treatment.  Eat a snack if it is more than one hour until your next meal. Sometimes, your blood glucose may go so low that you are unable to treat yourself. You may need someone to help you. You may even pass out or be unable to swallow. This may require you to get an injection of glucagon, which raises the blood glucose. HOME CARE INSTRUCTIONS  Check blood glucose as recommended by your caregiver.  Take medication as prescribed by your caregiver.  Follow your meal plan. Do not skip meals. Eat on time.  If you are going to drink alcohol, drink it only with meals.  Check your blood glucose before driving.  Check your blood glucose before and after exercise. If you exercise longer or different than usual, be sure to check blood glucose more frequently.  Always carry treatment with you. Glucose tablets are the easiest to carry.  Always wear medical alert jewelry or carry some form of identification that states that you have diabetes. This will alert people that you have diabetes. If you have hypoglycemia, they will have a better idea on what to do. SEEK MEDICAL CARE IF:   You are having problems keeping your blood sugar at target range.  You are having frequent episodes of hypoglycemia.  You feel you might be having side effects from your medicines.  You have symptoms of an illness that is not improving after 3-4 days.  You notice a change in vision or a new problem with your vision. SEEK IMMEDIATE MEDICAL CARE IF:   You are a family member or friend of a person whose blood glucose goes below 70 mg/dl and is accompanied by:  Confusion.  A change in mental status.  The inability to swallow.  Passing out. Document Released: 07/07/2005 Document Revised: 09/29/2011 Document Reviewed: 11/03/2011 [facility] Patient Information 2014 [place], [place].

## 2013-12-29 NOTE — ED Notes (Signed)
pts CBg 188 reported to nurse.

## 2014-03-29 ENCOUNTER — Inpatient Hospital Stay (HOSPITAL_COMMUNITY): Payer: Medicare Other

## 2014-03-29 ENCOUNTER — Inpatient Hospital Stay (HOSPITAL_COMMUNITY)
Admission: EM | Admit: 2014-03-29 | Discharge: 2014-03-31 | DRG: 064 | Disposition: A | Discharge status: Home / Self Care | Payer: Medicare Other | Attending: Internal Medicine | Admitting: Internal Medicine

## 2014-03-29 ENCOUNTER — Encounter (HOSPITAL_COMMUNITY): Payer: Self-pay | Admitting: *Deleted

## 2014-03-29 ENCOUNTER — Emergency Department (HOSPITAL_COMMUNITY): Payer: Medicare Other

## 2014-03-29 DIAGNOSIS — I639 Cerebral infarction, unspecified: Secondary | ICD-10-CM

## 2014-03-29 DIAGNOSIS — E119 Type 2 diabetes mellitus without complications: Secondary | ICD-10-CM | POA: Diagnosis present

## 2014-03-29 DIAGNOSIS — T82598A Other mechanical complication of other cardiac and vascular devices and implants, initial encounter: Secondary | ICD-10-CM

## 2014-03-29 DIAGNOSIS — I120 Hypertensive chronic kidney disease with stage 5 chronic kidney disease or end stage renal disease: Secondary | ICD-10-CM | POA: Diagnosis present

## 2014-03-29 DIAGNOSIS — Z7982 Long term (current) use of aspirin: Secondary | ICD-10-CM

## 2014-03-29 DIAGNOSIS — N186 End stage renal disease: Secondary | ICD-10-CM | POA: Diagnosis present

## 2014-03-29 DIAGNOSIS — D649 Anemia, unspecified: Secondary | ICD-10-CM | POA: Diagnosis present

## 2014-03-29 DIAGNOSIS — I10 Essential (primary) hypertension: Secondary | ICD-10-CM

## 2014-03-29 DIAGNOSIS — Z9119 Patient's noncompliance with other medical treatment and regimen: Secondary | ICD-10-CM | POA: Diagnosis not present

## 2014-03-29 DIAGNOSIS — M129 Arthropathy, unspecified: Secondary | ICD-10-CM | POA: Diagnosis present

## 2014-03-29 DIAGNOSIS — R29810 Facial weakness: Secondary | ICD-10-CM | POA: Diagnosis present

## 2014-03-29 DIAGNOSIS — Z91199 Patient's noncompliance with other medical treatment and regimen due to unspecified reason: Secondary | ICD-10-CM | POA: Diagnosis not present

## 2014-03-29 DIAGNOSIS — K219 Gastro-esophageal reflux disease without esophagitis: Secondary | ICD-10-CM | POA: Diagnosis present

## 2014-03-29 DIAGNOSIS — N2581 Secondary hyperparathyroidism of renal origin: Secondary | ICD-10-CM | POA: Diagnosis present

## 2014-03-29 DIAGNOSIS — E785 Hyperlipidemia, unspecified: Secondary | ICD-10-CM | POA: Diagnosis present

## 2014-03-29 DIAGNOSIS — R739 Hyperglycemia, unspecified: Secondary | ICD-10-CM

## 2014-03-29 DIAGNOSIS — I6350 Cerebral infarction due to unspecified occlusion or stenosis of unspecified cerebral artery: Principal | ICD-10-CM | POA: Diagnosis present

## 2014-03-29 DIAGNOSIS — Z794 Long term (current) use of insulin: Secondary | ICD-10-CM | POA: Diagnosis not present

## 2014-03-29 DIAGNOSIS — T82898A Other specified complication of vascular prosthetic devices, implants and grafts, initial encounter: Secondary | ICD-10-CM

## 2014-03-29 DIAGNOSIS — Z992 Dependence on renal dialysis: Secondary | ICD-10-CM

## 2014-03-29 DIAGNOSIS — I161 Hypertensive emergency: Secondary | ICD-10-CM

## 2014-03-29 DIAGNOSIS — E139 Other specified diabetes mellitus without complications: Secondary | ICD-10-CM

## 2014-03-29 HISTORY — DX: Dependence on renal dialysis: Z99.2

## 2014-03-29 HISTORY — DX: End stage renal disease: N18.6

## 2014-03-29 LAB — CBC
HCT: 38.7 % (ref 36.0–46.0)
HEMATOCRIT: 33.6 % — AB (ref 36.0–46.0)
Hemoglobin: 13.2 g/dL (ref 12.0–15.0)
Hemoglobin: 11.5 g/dL — ABNORMAL LOW (ref 12.0–15.0)
MCH: 28.8 pg (ref 26.0–34.0)
MCH: 28.7 pg (ref 26.0–34.0)
MCHC: 34.1 g/dL (ref 30.0–36.0)
MCHC: 34.2 g/dL (ref 30.0–36.0)
MCV: 84.3 fL (ref 78.0–100.0)
MCV: 83.8 fL (ref 78.0–100.0)
PLATELETS: 212 10*3/uL (ref 150–400)
Platelets: 211 10*3/uL (ref 150–400)
RBC: 4.59 MIL/uL (ref 3.87–5.11)
RBC: 4.01 MIL/uL (ref 3.87–5.11)
RDW: 14.2 % (ref 11.5–15.5)
RDW: 14.2 % (ref 11.5–15.5)
WBC: 4.9 10*3/uL (ref 4.0–10.5)
WBC: 5.9 10*3/uL (ref 4.0–10.5)

## 2014-03-29 LAB — COMPREHENSIVE METABOLIC PANEL
ALK PHOS: 124 U/L — AB (ref 39–117)
ALT: 12 U/L (ref 0–35)
ANION GAP: 24 — AB (ref 5–15)
AST: 20 U/L (ref 0–37)
Albumin: 4.5 g/dL (ref 3.5–5.2)
BUN: 33 mg/dL — ABNORMAL HIGH (ref 6–23)
CO2: 22 mEq/L (ref 19–32)
Calcium: 10.2 mg/dL (ref 8.4–10.5)
Chloride: 88 mEq/L — ABNORMAL LOW (ref 96–112)
Creatinine, Ser: 8.6 mg/dL — ABNORMAL HIGH (ref 0.50–1.10)
GFR calc Af Amer: 6 mL/min — ABNORMAL LOW (ref >90)
GFR, EST NON AFRICAN AMERICAN: 5 mL/min — AB (ref >90)
Glucose, Bld: 233 mg/dL — ABNORMAL HIGH (ref 70–99)
Potassium: 4.7 mEq/L (ref 3.7–5.3)
SODIUM: 134 meq/L — AB (ref 137–147)
TOTAL PROTEIN: 8.6 g/dL — AB (ref 6.0–8.3)
Total Bilirubin: 0.3 mg/dL (ref 0.3–1.2)

## 2014-03-29 LAB — I-STAT CHEM 8, ED
BUN: 46 mg/dL — AB (ref 6–23)
CHLORIDE: 98 meq/L (ref 96–112)
CREATININE: 9.9 mg/dL — AB (ref 0.50–1.10)
Calcium, Ion: 0.96 mmol/L — ABNORMAL LOW (ref 1.12–1.23)
Glucose, Bld: 251 mg/dL — ABNORMAL HIGH (ref 70–99)
HCT: 43 % (ref 36.0–46.0)
Hemoglobin: 14.6 g/dL (ref 12.0–15.0)
POTASSIUM: 5 meq/L (ref 3.7–5.3)
SODIUM: 131 meq/L — AB (ref 137–147)
TCO2: 31 mmol/L (ref 0–100)

## 2014-03-29 LAB — CREATININE, SERUM
Creatinine, Ser: 9.51 mg/dL — ABNORMAL HIGH (ref 0.50–1.10)
GFR calc non Af Amer: 5 mL/min — ABNORMAL LOW (ref >90)
GFR, EST AFRICAN AMERICAN: 5 mL/min — AB (ref >90)

## 2014-03-29 LAB — DIFFERENTIAL
BASOS ABS: 0 10*3/uL (ref 0.0–0.1)
BASOS PCT: 1 % (ref 0–1)
Eosinophils Absolute: 0.2 10*3/uL (ref 0.0–0.7)
Eosinophils Relative: 4 % (ref 0–5)
Lymphocytes Relative: 36 % (ref 12–46)
Lymphs Abs: 1.8 10*3/uL (ref 0.7–4.0)
MONO ABS: 0.2 10*3/uL (ref 0.1–1.0)
Monocytes Relative: 5 % (ref 3–12)
NEUTROS ABS: 2.7 10*3/uL (ref 1.7–7.7)
Neutrophils Relative %: 54 % (ref 43–77)

## 2014-03-29 LAB — RAPID URINE DRUG SCREEN, HOSP PERFORMED
Amphetamines: NOT DETECTED
BARBITURATES: NOT DETECTED
Benzodiazepines: NOT DETECTED
Cocaine: NOT DETECTED
Opiates: NOT DETECTED
Tetrahydrocannabinol: NOT DETECTED

## 2014-03-29 LAB — URINALYSIS, ROUTINE W REFLEX MICROSCOPIC
Bilirubin Urine: NEGATIVE
Glucose, UA: >1000 mg/dL — AB
Ketones, ur: NEGATIVE mg/dL
NITRITE: NEGATIVE
PH: 8 (ref 5.0–8.0)
Protein, ur: 100 mg/dL — AB
Specific Gravity, Urine: 1.015 (ref 1.005–1.030)
Urobilinogen, UA: 0.2 mg/dL (ref 0.0–1.0)

## 2014-03-29 LAB — URINE MICROSCOPIC-ADD ON

## 2014-03-29 LAB — PROTIME-INR
INR: 1.04 (ref 0.00–1.49)
PROTHROMBIN TIME: 13.6 s (ref 11.6–15.2)

## 2014-03-29 LAB — APTT: APTT: 33 s (ref 24–37)

## 2014-03-29 LAB — ETHANOL

## 2014-03-29 LAB — I-STAT TROPONIN, ED: TROPONIN I, POC: 0.04 ng/mL (ref 0.00–0.08)

## 2014-03-29 MED ORDER — HEPARIN SODIUM (PORCINE) 5000 UNIT/ML IJ SOLN
5000.0000 [IU] | Freq: Three times a day (TID) | INTRAMUSCULAR | Status: DC
  Administered 2014-03-29 – 2014-03-31 (×5): 5000 [IU] via SUBCUTANEOUS
  Filled 2014-03-29 (×5): qty 1

## 2014-03-29 MED ORDER — ALBUTEROL SULFATE (2.5 MG/3ML) 0.083% IN NEBU: 2.5000 mg | INHALATION_SOLUTION | Freq: Four times a day (QID) | RESPIRATORY_TRACT | Status: DC | PRN

## 2014-03-29 MED ORDER — INFLUENZA VAC SPLIT QUAD 0.5 ML IM SUSY
0.5000 mL | PREFILLED_SYRINGE | INTRAMUSCULAR | Status: AC
  Administered 2014-03-30: 0.5 mL via INTRAMUSCULAR
  Filled 2014-03-29: qty 0.5

## 2014-03-29 MED ORDER — ASPIRIN 300 MG RE SUPP
300.0000 mg | Freq: Every day | RECTAL | Status: DC
  Administered 2014-03-29: 300 mg via RECTAL
  Filled 2014-03-29: qty 1

## 2014-03-29 MED ORDER — ALBUTEROL SULFATE HFA 108 (90 BASE) MCG/ACT IN AERS: 2.0000 | INHALATION_SPRAY | Freq: Four times a day (QID) | RESPIRATORY_TRACT | Status: DC | PRN

## 2014-03-29 MED ORDER — STROKE: EARLY STAGES OF RECOVERY BOOK
Freq: Once | Status: AC
  Administered 2014-03-29: 1
  Filled 2014-03-29: qty 1

## 2014-03-29 MED ORDER — ASPIRIN 325 MG PO TABS
325.0000 mg | ORAL_TABLET | Freq: Every day | ORAL | Status: DC
  Administered 2014-03-31: 325 mg via ORAL
  Filled 2014-03-29: qty 1

## 2014-03-29 MED ORDER — SENNOSIDES-DOCUSATE SODIUM 8.6-50 MG PO TABS: 1.0000 | ORAL_TABLET | Freq: Every evening | ORAL | Status: DC | PRN

## 2014-03-29 MED ORDER — INSULIN ASPART 100 UNIT/ML ~~LOC~~ SOLN
0.0000 [IU] | SUBCUTANEOUS | Status: DC
  Administered 2014-03-30: 3 [IU] via SUBCUTANEOUS
  Administered 2014-03-30: 7 [IU] via SUBCUTANEOUS

## 2014-03-29 MED ORDER — ASPIRIN 300 MG RE SUPP
300.0000 mg | Freq: Every day | RECTAL | Status: DC
  Administered 2014-03-30: 300 mg via RECTAL
  Filled 2014-03-29: qty 1

## 2014-03-29 MED ORDER — INSULIN GLARGINE 100 UNIT/ML ~~LOC~~ SOLN
10.0000 [IU] | Freq: Every day | SUBCUTANEOUS | Status: DC
  Filled 2014-03-29: qty 0.1

## 2014-03-29 NOTE — ED Notes (Signed)
RN and phlebotomy attempted to Obtain labs/ IV access. Will call IV team

## 2014-03-29 NOTE — ED Notes (Signed)
The pt is c/oi approx 1456 her lip was drooping and  She had difficulty keeping water in her mouth. And her bp is very high.  Sl to no facvial droop no arm drift

## 2014-03-29 NOTE — Progress Notes (Signed)
Spoke with Maudie Mercury RN about [phone] room ready and awaiting arrival of pt.

## 2014-03-29 NOTE — ED Notes (Signed)
The pt has rt neck pain also.  She took more bp meds and an aspirin

## 2014-03-29 NOTE — ED Notes (Signed)
Admittng MD at bedside

## 2014-03-29 NOTE — ED Provider Notes (Signed)
I saw and evaluated the patient, reviewed the resident's note and I agree with the findings and plan.  I have evaluated the patient and reviewed her history present illness. I did get a history for right-sided facial droop that presented with difficulty using a straw. The patient did not have any extremity symptoms associated she also did not have any associated visual dysfunction. The patient where she had mild frontal headache in association with this. The patient reports that she had missed 2 doses of her blood pressure medications today she had missed her breakfast and lunch dose. She went home and took her blood pressure and found it to be 157/112. She took 3 labetalol tablets for a total dose of 900 mg, she took a 10 mg amlodipine tablet, she also took #2 81 mg aspirin.  Physical exam shows patient to be alert and appropriate and cognitively intact. She does have positive facial droop tongue is midline. Extraocular motions are intact. There no positive cerebellar findings.  Neurology has assessed the patient and reviewed her physical exam and her CT findings. She will be admitted for further stroke workup.  Diagnosis is CVA acute. Hypertensive urgency. Chronic renal failure on dialysis  CRITICAL CARE Performed by: Charlesetta Shanks   Total critical care time: 30  Critical care time was exclusive of separately billable procedures and treating other patients.  Critical care was necessary to treat or prevent imminent or life-threatening deterioration.  Critical care was time spent personally by me on the following activities: development of treatment plan with patient and/or surrogate as well as nursing, discussions with consultants, evaluation of patient's response to treatment, examination of patient, obtaining history from patient or surrogate, ordering and performing treatments and interventions, ordering and review of laboratory studies, ordering and review of radiographic studies, pulse  oximetry and re-evaluation of patient's condition.   EKG Interpretation None       Charlesetta Shanks, MD 03/29/14 [phone]

## 2014-03-29 NOTE — ED Provider Notes (Signed)
CSN: [id number]     Arrival date & time 03/29/14  1521 History   First MD Initiated Contact with Patient 03/29/14 1601     Chief Complaint  Patient presents with  . stroke -like symptoms     The history is provided by the patient and the EMS personnel.   Pt with PMHs for CKD on HD, HTN, HLD, DM who presents with right facial droop while brushing teeth at approx 1500.  Felt water dripping out of mouth. Looking in mirror she noted right facial droop. Pt noted her BP was very high (160/80) so she took 3 300mg  labetalol, 10 mg amlodipine and 162mg  ASA. She reports recent non compliance with BP meds. Pt endorses very mild bifrontal headahce, not worst of life.  Denies other weakness, vision change, sensory loss. Has no hx of aneurysms. No recent illness.  Compliant with diabetes medications and hemodialysis.   Past Medical History  Diagnosis Date  . Chronic kidney disease   . Diabetes mellitus   . Hypertension   . Hyperlipidemia   . Anemia   . GERD (gastroesophageal reflux disease)   . Arthritis   . History of blood transfusion    Past Surgical History  Procedure Laterality Date  . Arteriovenous graft placement    . Cesarean section    . Eye surgery      LAzer  . Artery repair Left 12/10/2012    Procedure: BRACHIAL ARTERY REPAIR;  Surgeon: Angelia Mould, MD;  Location: [facility] OR;  Service: Vascular;  Laterality: Left;  Exploration Left Brachial Artery for AVF   Family History  Problem Relation Age of Onset  . Cancer Mother     breast  . Hypertension Father    History  Substance Use Topics  . Smoking status: Never Smoker   . Smokeless tobacco: Never Used  . Alcohol Use: No   OB History   Grav Para Term Preterm Abortions TAB SAB Ect Mult Living                 Review of Systems  Constitutional: Negative for fever and chills.  Respiratory: Negative for cough, shortness of breath and wheezing.   Cardiovascular: Negative for chest pain.  Gastrointestinal: Negative for  nausea, vomiting and abdominal pain.  Musculoskeletal: Negative for back pain, neck pain and neck stiffness.  Skin: Negative for rash.  Neurological: Positive for facial asymmetry and numbness. Negative for weakness and headaches.  All other systems reviewed and are negative.     Allergies  Pollen extract  Home Medications   Prior to Admission medications   Medication Sig Start Date End Date Taking? Authorizing Provider  albuterol (PROVENTIL HFA;VENTOLIN HFA) 108 (90 BASE) MCG/ACT inhaler Inhale 2 puffs into the lungs every 6 (six) hours as needed for wheezing or shortness of breath.     Historical Provider, MD  amLODipine (NORVASC) 10 MG tablet Take 10 mg by mouth daily.     Historical Provider, MD  aspirin 81 MG tablet Take 81 mg by mouth daily.      Historical Provider, MD  calcium acetate (PHOSLO) 667 MG capsule Take 667 mg by mouth 3 (three) times daily with meals.      Historical Provider, MD  GLUCAGON EMERGENCY 1 MG injection Inject 1 mg into the muscle once as needed (for severe low blood sugar).  11/22/12   Historical Provider, MD  glucose 4 GM chewable tablet Chew 16 g by mouth as needed for low blood sugar.  Historical Provider, MD  insulin glargine (LANTUS) 100 UNIT/ML injection Inject 10 Units into the skin at bedtime.     Historical Provider, MD  insulin lispro (HUMALOG) 100 UNIT/ML injection Inject 3-15 Units into the skin 3 (three) times daily before meals. Per sliding scale    Historical Provider, MD  labetalol (NORMODYNE) 300 MG tablet Take 600 mg by mouth 3 (three) times daily.     Historical Provider, MD  medroxyPROGESTERone (DEPO-PROVERA) 150 MG/ML injection Inject 150 mg into the muscle every 3 (three) months.      Historical Provider, MD  Pediatric Multiple Vit-C-FA (FLINSTONES GUMMIES OMEGA-3 DHA) CHEW Chew 1 tablet by mouth daily.     Historical Provider, MD  rosuvastatin (CRESTOR) 10 MG tablet Take 10 mg by mouth daily.     Historical Provider, MD   BP 157/85   Pulse 85  Temp(Src) 98.8 F (37.1 C)  Resp 18  Ht 5\' 2"  (1.575 m)  Wt 118 lb (53.524 kg)  BMI 21.58 kg/m2  SpO2 100% Physical Exam  Nursing note and vitals reviewed. Constitutional: She is oriented to person, place, and time. She appears well-developed and well-nourished. No distress.  HENT:  Head: Normocephalic and atraumatic.  Nose: Nose normal.  Mouth/Throat: No oropharyngeal exudate.  Eyes: Conjunctivae are normal. Pupils are equal, round, and reactive to light. No scleral icterus.  Neck: Normal range of motion. Neck supple. No tracheal deviation present.  Cardiovascular: Normal rate, regular rhythm and normal heart sounds.   No murmur heard. LUE AVF with thrill  Pulmonary/Chest: Effort normal and breath sounds normal. No respiratory distress. She has no rales.  Abdominal: Soft. Bowel sounds are normal. She exhibits no distension and no mass. There is no tenderness.  Musculoskeletal: Normal range of motion. She exhibits no edema.  Neurological: She is alert and oriented to person, place, and time. A cranial nerve deficit is present.  CN 3-12 tested and intact with exception of R facial droop. 5/5 strength in all extremities.  No drift. Normal sensation. Equal DTRs. Normal tone  Skin: Skin is warm and dry. No rash noted.  Psychiatric: She has a normal mood and affect.    ED Course  Procedures (including critical care time) Labs Review Labs Reviewed  COMPREHENSIVE METABOLIC PANEL - Abnormal; Notable for the following:    Sodium 134 (*)    Chloride 88 (*)    Glucose, Bld 233 (*)    BUN 33 (*)    Creatinine, Ser 8.60 (*)    Total Protein 8.6 (*)    Alkaline Phosphatase 124 (*)    GFR calc non Af Amer 5 (*)    GFR calc Af Amer 6 (*)    Anion gap 24 (*)    All other components within normal limits  URINALYSIS, ROUTINE W REFLEX MICROSCOPIC - Abnormal; Notable for the following:    Glucose, UA >1000 (*)    Hgb urine dipstick SMALL (*)    Protein, ur 100 (*)    Leukocytes,  UA TRACE (*)    All other components within normal limits  I-STAT CHEM 8, ED - Abnormal; Notable for the following:    Sodium 131 (*)    BUN 46 (*)    Creatinine, Ser 9.90 (*)    Glucose, Bld 251 (*)    Calcium, Ion 0.96 (*)    All other components within normal limits  ETHANOL  PROTIME-INR  APTT  CBC  DIFFERENTIAL  URINE RAPID DRUG SCREEN (HOSP PERFORMED)  URINE MICROSCOPIC-ADD ON  HEMOGLOBIN A1C  LIPID PANEL  CBG MONITORING, ED  I-STAT TROPOININ, ED  I-STAT TROPOININ, ED    Imaging Review Ct Head Wo Contrast  03/29/2014   CLINICAL DATA:  Acute stroke.  Right facial droop.  EXAM: CT HEAD WITHOUT CONTRAST  TECHNIQUE: Contiguous axial images were obtained from the base of the skull through the vertex without intravenous contrast.  COMPARISON:  01/29/2013  FINDINGS: There is no evidence of intracranial hemorrhage, brain edema, or other signs of acute infarction. There is no evidence of intracranial mass lesion or mass effect. No abnormal extraaxial fluid collections are identified.  Mild generalized cerebral atrophy appears stable. Ventricles are stable in size. No skull abnormality identified.  IMPRESSION: No acute intracranial abnormality.  Stable mild cerebral atrophy.  These results were called by telephone at the time of interpretation on 03/29/2014 at 4:23 pm to Dr. Armida Sans , who verbally acknowledged these results.   Electronically Signed   By: Earle Gell M.D.   On: 03/29/2014 16:23     Date: 03/29/2014  Rate:87   Rhythm: normal sinus rhythm  QRS Axis: normal  Intervals: QT prolonged QTc 519  ST/T Wave abnormalities: early repolarization  Conduction Disutrbances:none  Narrative Interpretation: LVH with early repol  Old EKG Reviewed: unchanged   MDM   Final diagnoses:  Cerebral infarction due to unspecified mechanism  Hypertensive emergency  Hyperglycemia    PMH CKD on HD.  Pt co managed with neurology. Code stroke called on arrival. ABCs intact on arrival.  Found pt  in CT before I could assess FSBG, ordered. R facial droop noted.  FSBG without hypoglycemia.  No other neuro deficit appreciated. Pt refused tPA after discussion of risk/benefits with neurology.  Possible inciting trigger could be HTN emergency.  Neurology recommends keeping BP at current level and defers treatment.  Pt is a dialysis patient, compliant with T/Th/Sa regimen.    Dispo: admit to hospitalist     Tammy Sours, MD 03/29/14 2312

## 2014-03-29 NOTE — ED Notes (Signed)
[facility], RN with in and out cath. Pt remains monitored by blood pressure, pulse ox, and 12 lead.

## 2014-03-29 NOTE — Consult Note (Signed)
Referring Physician: ED    Chief Complaint: code stroke, dizziness, right face weakness  HPI:                                                                                                                                         [name] is an 38 y.o. female with a past medical history significant for HTN, DM, hyperlipidemia, renal failure on HD, drove herself to the ED for further evaluation of the above stated symptoms and a code stroke was called. Patient indicated that she was driving when her right lip started drooping, she had difficulty keeping water in her mouth, felt dizzy and fatigue. Upon arrival to the ED she was noted to have significant ly elevated BP, right face weakness but no speech changes, focal arm or leg weakness, numbness, confusion, vertigo, HA, or double vision. The patient reports that she had missed 2 doses of her blood pressure medications today she had missed her breakfast and lunch dose. She went home and took her blood pressure and found it to be 157/112. She took 3 labetalol tablets for a total dose of 900 mg, she took a 10 mg amlodipine tablet, she also took #2 81 mg aspirin NIHSS 1 CT brain revealed no acute intracranial abnormality.    Date last known well: 03/29/14 Time last known well: 1456 tPA Given: no, mild deficits NIHSS: 1   Past Medical History  Diagnosis Date  . Chronic kidney disease   . Diabetes mellitus   . Hypertension   . Hyperlipidemia   . Anemia   . GERD (gastroesophageal reflux disease)   . Arthritis   . History of blood transfusion     Past Surgical History  Procedure Laterality Date  . Arteriovenous graft placement    . Cesarean section    . Eye surgery      LAzer  . Artery repair Left 12/10/2012    Procedure: BRACHIAL ARTERY REPAIR;  Surgeon: Angelia Mould, MD;  Location: [facility] OR;  Service: Vascular;  Laterality: Left;  Exploration Left Brachial Artery for AVF    Family History  Problem Relation Age of Onset  .  Cancer Mother     breast  . Hypertension Father    Social History:  reports that she has never smoked. She has never used smokeless tobacco. She reports that she does not drink alcohol or use illicit drugs.  Allergies:  Allergies  Allergen Reactions  . Pollen Extract Other (See Comments)    Watery eyes    Medications:  I have reviewed the patient's current medications.  ROS:                                                                                                                                       History obtained from the patient and chart review.  General ROS: negative for - chills, fatigue, fever, night sweats,or weight loss Psychological ROS: negative for - behavioral disorder, hallucinations, memory difficulties, mood swings or suicidal ideation Ophthalmic ROS: negative for - blurry vision, double vision, eye pain or loss of vision ENT ROS: negative for - epistaxis, nasal discharge, oral lesions, sore throat, tinnitus or vertigo Allergy and Immunology ROS: negative for - hives or itchy/watery eyes Hematological and Lymphatic ROS: negative for - bleeding problems, bruising or swollen lymph nodes Endocrine ROS: negative for - galactorrhea, hair pattern changes, polydipsia/polyuria or temperature intolerance Respiratory ROS: negative for - cough, hemoptysis, shortness of breath or wheezing Cardiovascular ROS: negative for - chest pain, dyspnea on exertion, edema or irregular heartbeat Gastrointestinal ROS: negative for - abdominal pain, diarrhea, hematemesis, nausea/vomiting or stool incontinence Genito-Urinary ROS: negative for - dysuria, hematuria, incontinence or urinary frequency/urgency Musculoskeletal ROS: negative for - joint swelling or muscular weakness Neurological ROS: as noted in HPI Dermatological ROS: negative for rash and skin lesion  changes  Physical exam: pleasant female in no apparent distress. Blood pressure 157/85, pulse 85, temperature 98.8 F (37.1 C), resp. rate 18, height 5\' 2"  (1.575 m), weight 53.524 kg (118 lb), SpO2 100.00%. Head: normocephalic. Neck: supple, no bruits, no JVD. Cardiac: no murmurs. Lungs: clear. Abdomen: soft, no tender, no mass. Extremities: no edema. Neurologic Examination:                                                                                                      General: Mental Status: Alert, oriented, thought content appropriate.  Speech fluent without evidence of aphasia.  Able to follow 3 step commands without difficulty. Cranial Nerves: II: Discs flat bilaterally; Visual fields grossly normal, pupils equal, round, reactive to light and accommodation III,IV, VI: ptosis not present, extra-ocular motions intact bilaterally V,VII: smile asymmetric due to right face weakness, facial light touch sensation normal bilaterally VIII: hearing normal bilaterally IX,X: gag reflex present XI: bilateral shoulder shrug XII: midline tongue extension without atrophy or fasciculations Motor: Right : Upper extremity   5/5    Left:     Upper extremity   5/5  Lower extremity   5/5     Lower extremity  5/5 Tone and bulk:normal tone throughout; no atrophy noted Sensory: Pinprick and light touch intact throughout, bilaterally Deep Tendon Reflexes:  Right: Upper Extremity   Left: Upper extremity   biceps (C-5 to C-6) 2/4   biceps (C-5 to C-6) 2/4 tricep (C7) 2/4    triceps (C7) 2/4 Brachioradialis (C6) 2/4  Brachioradialis (C6) 2/4  Lower Extremity Lower Extremity  quadriceps (L-2 to L-4) 2/4   quadriceps (L-2 to L-4) 2/4 Achilles (S1) 2/4   Achilles (S1) 2/4  Plantars: Right: downgoing   Left: downgoing Cerebellar: normal finger-to-nose,  normal heel-to-shin test Gait: no tested CV: pulses palpable throughout    No results found for this or any previous visit (from the past 48  hour(s)). Ct Head Wo Contrast  03/29/2014   CLINICAL DATA:  Acute stroke.  Right facial droop.  EXAM: CT HEAD WITHOUT CONTRAST  TECHNIQUE: Contiguous axial images were obtained from the base of the skull through the vertex without intravenous contrast.  COMPARISON:  01/29/2013  FINDINGS: There is no evidence of intracranial hemorrhage, brain edema, or other signs of acute infarction. There is no evidence of intracranial mass lesion or mass effect. No abnormal extraaxial fluid collections are identified.  Mild generalized cerebral atrophy appears stable. Ventricles are stable in size. No skull abnormality identified.  IMPRESSION: No acute intracranial abnormality.  Stable mild cerebral atrophy.  These results were called by telephone at the time of interpretation on 03/29/2014 at 4:23 pm to Dr. Armida Sans , who verbally acknowledged these results.   Electronically Signed   By: Earle Gell M.D.   On: 03/29/2014 16:23     Assessment: 38 y.o. female with possible small left subcortical ischemic infarct. NIHSS 1 and thus thrombolysis was not considered. Patient is not willing to get enroll in a clinical trial. Failied bedside swallowing evaluation, thus will give rectal aspirin. Complete stroke work up.  Stroke team will follow up tomorrow.  Stroke Risk Factors -  HTN, DM, hyperlipidemia, renal failure  Plan: 1. HgbA1c, fasting lipid panel 2. MRI, MRA  of the brain without contrast 3. Echocardiogram 4. Carotid dopplers 5. Prophylactic therapy-rectal aspirin until she passes swallowing evaluation 6. Risk factor modification 7. Telemetry monitoring 8. Frequent neuro checks 9. PT/OT SLP   Dorian Pod, MD [facility] [phone]  03/29/2014, 4:35 PM

## 2014-03-29 NOTE — ED Notes (Signed)
Pt placed into gown and on monitor upon arrival to room. Pt monitored by blood pressure, pulse ox, and 12 lead. Pts CBG= 212 reported to RN, [facility]. Pts EKG given to Dr. Beverly Sessions.

## 2014-03-29 NOTE — H&P (Signed)
[facility] History and Physical  MARGI MAGOS [id number] DOB: 10-03-1975 DOA: 03/29/2014  Referring physician:  PCP: Benito Mccreedy, MD  Specialists:   Chief Complaint: Stroke like symptoms, right-sided facial droop  HPI: [name] is a 38 y.o. female  With a history of end-stage renal disease requiring hemodialysis, diabetes mellitus, hypertension, hyperlipidemia that presented to the emergency department with complaints of right-sided facial droop. Patient was standing at the bus stop waiting for her child at approximately 2:56 PM, at which point she was drinking water and felt a drooping of her mouth. Patient home, looked in the near abdominal her smile is asymmetric. Patient denied any weakness or numbness in her extremities. Patient did state that prior to this episode, she did feel a headache in the front of her head which was controlled. At the time of admission, patients headache resolved. Patient also had some dizziness prior to emergency Department arrival which has also resolved.  Patient reported she had missed 2 doses of her blood pressure medications on the day of admission, at breakfast as well as lunch time. When patient arrived home, she noted her blood pressure was 157/112, at that time decided to take 3 labetalol pills for a total dose of 900 mg. She also took her 10 mg amlodipine pill as well. Patient also took 281 mg aspirins. Upon arrival to the emergency department, patient was noted to be a code stroke. She was not given TPA. CT of the head did not show any acute abnormalities. Neurology was consulted by the emergency room physician.  Review of Systems:  Constitutional: Denies fever, chills, diaphoresis, appetite change and fatigue.  HEENT: Denies photophobia, eye pain, redness, hearing loss, ear pain, congestion, sore throat, rhinorrhea, sneezing, mouth sores, trouble swallowing, neck pain, neck stiffness and tinnitus.   Respiratory: Denies SOB,  DOE, cough, chest tightness,  and wheezing.   Cardiovascular: Denies chest pain, palpitations and leg swelling.  Gastrointestinal: Denies nausea, vomiting, abdominal pain, diarrhea, constipation, blood in stool and abdominal distention.  Genitourinary: Denies dysuria, urgency, frequency, hematuria, flank pain and difficulty urinating.  Musculoskeletal: Denies myalgias, back pain, joint swelling, arthralgias and gait problem.  Skin: Denies pallor, rash and wound.  Neurological: Complained of dizziness, headache, which have resolved.  Right sided facial droop Hematological: Denies adenopathy. Easy bruising, personal or family bleeding history  Psychiatric/Behavioral: Denies suicidal ideation, mood changes, confusion, nervousness, sleep disturbance and agitation  Past Medical History  Diagnosis Date  . Chronic kidney disease   . Diabetes mellitus   . Hypertension   . Hyperlipidemia   . Anemia   . GERD (gastroesophageal reflux disease)   . Arthritis   . History of blood transfusion    Past Surgical History  Procedure Laterality Date  . Arteriovenous graft placement    . Cesarean section    . Eye surgery      LAzer  . Artery repair Left 12/10/2012    Procedure: BRACHIAL ARTERY REPAIR;  Surgeon: Angelia Mould, MD;  Location: [facility] OR;  Service: Vascular;  Laterality: Left;  Exploration Left Brachial Artery for AVF   Social History:  reports that she has never smoked. She has never used smokeless tobacco. She reports that she does not drink alcohol or use illicit drugs.   Allergies  Allergen Reactions  . Pollen Extract Other (See Comments)    Watery eyes    Family History  Problem Relation Age of Onset  . Cancer Mother     breast  . Hypertension Father  Prior to Admission medications   Medication Sig Start Date End Date Taking? Authorizing Provider  amLODipine (NORVASC) 10 MG tablet Take 10 mg by mouth daily.    Yes Historical Provider, MD  aspirin 81 MG tablet Take  81 mg by mouth daily.     Yes Historical Provider, MD  labetalol (NORMODYNE) 300 MG tablet Take 600 mg by mouth 3 (three) times daily.    Yes Historical Provider, MD  medroxyPROGESTERone (DEPO-PROVERA) 150 MG/ML injection Inject 150 mg into the muscle every 3 (three) months.     Yes Historical Provider, MD  albuterol (PROVENTIL HFA;VENTOLIN HFA) 108 (90 BASE) MCG/ACT inhaler Inhale 2 puffs into the lungs every 6 (six) hours as needed for wheezing or shortness of breath.     Historical Provider, MD  calcium acetate (PHOSLO) 667 MG capsule Take 667 mg by mouth 3 (three) times daily with meals.      Historical Provider, MD  GLUCAGON EMERGENCY 1 MG injection Inject 1 mg into the muscle once as needed (for severe low blood sugar).  11/22/12   Historical Provider, MD  glucose 4 GM chewable tablet Chew 16 g by mouth as needed for low blood sugar.    Historical Provider, MD  insulin glargine (LANTUS) 100 UNIT/ML injection Inject 10 Units into the skin at bedtime.     Historical Provider, MD  insulin lispro (HUMALOG) 100 UNIT/ML injection Inject 3-15 Units into the skin 3 (three) times daily before meals. Per sliding scale    Historical Provider, MD  Pediatric Multiple Vit-C-FA (FLINSTONES GUMMIES OMEGA-3 DHA) CHEW Chew 1 tablet by mouth daily.     Historical Provider, MD  rosuvastatin (CRESTOR) 10 MG tablet Take 10 mg by mouth daily.     Historical Provider, MD   Physical Exam: Filed Vitals:   03/29/14 1722  BP: 154/89  Pulse: 86  Temp:   Resp: 16     General: Well developed, well nourished, NAD, appears stated age  HEENT: NCAT, PERRLA, EOMI, Anicteic Sclera, mucous membranes moist.   Neck: Supple, no JVD, no masses  Cardiovascular: S1 S2 auscultated, 1/6 SEM. Regular rate and rhythm.  Respiratory: Clear to auscultation bilaterally with equal chest rise  Abdomen: Soft, nontender, nondistended, + bowel sounds  Extremities: warm dry without cyanosis clubbing or edema, LUE AVF  +bruit/thrill  Neuro: AAOx3, cranial nerves grossly intact, right-sided facial droop. Strength 5/5 in patient's upper and lower extremities bilaterally  Skin: Without rashes exudates or nodules  Psych: Normal affect and demeanor with intact judgement and insight  Labs on Admission:  Basic Metabolic Panel:  Recent Labs Lab 03/29/14 1622 03/29/14 1645  NA 134* 131*  K 4.7 5.0  CL 88* 98  CO2 22  --   GLUCOSE 233* 251*  BUN 33* 46*  CREATININE 8.60* 9.90*  CALCIUM 10.2  --    Liver Function Tests:  Recent Labs Lab 03/29/14 1622  AST 20  ALT 12  ALKPHOS 124*  BILITOT 0.3  PROT 8.6*  ALBUMIN 4.5   No results found for this basename: LIPASE, AMYLASE,  in the last 168 hours No results found for this basename: AMMONIA,  in the last 168 hours CBC:  Recent Labs Lab 03/29/14 1622 03/29/14 1645  WBC 4.9  --   NEUTROABS 2.7  --   HGB 13.2 14.6  HCT 38.7 43.0  MCV 84.3  --   PLT 212  --    Cardiac Enzymes: No results found for this basename: CKTOTAL, CKMB, CKMBINDEX, TROPONINI,  in the last 168 hours  BNP (last 3 results) No results found for this basename: PROBNP,  in the last 8760 hours CBG: No results found for this basename: GLUCAP,  in the last 168 hours  Radiological Exams on Admission: Ct Head Wo Contrast  03/29/2014   CLINICAL DATA:  Acute stroke.  Right facial droop.  EXAM: CT HEAD WITHOUT CONTRAST  TECHNIQUE: Contiguous axial images were obtained from the base of the skull through the vertex without intravenous contrast.  COMPARISON:  01/29/2013  FINDINGS: There is no evidence of intracranial hemorrhage, brain edema, or other signs of acute infarction. There is no evidence of intracranial mass lesion or mass effect. No abnormal extraaxial fluid collections are identified.  Mild generalized cerebral atrophy appears stable. Ventricles are stable in size. No skull abnormality identified.  IMPRESSION: No acute intracranial abnormality.  Stable mild cerebral  atrophy.  These results were called by telephone at the time of interpretation on 03/29/2014 at 4:23 pm to Dr. Armida Sans , who verbally acknowledged these results.   Electronically Signed   By: Earle Gell M.D.   On: 03/29/2014 16:23    EKG: Independently reviewed. Sinus rhythm, rate 87, LVH  Assessment/Plan  Right-sided facial droop, suspect CVA -Patient will be admitted to telemetry unit. -Will monitor for any tachycardia or bradycardia arrhythmias -CT of the head: No acute intracranial abnormality. -Neurology consulted and appreciated -Will continue rectal aspirin until patient is able to pass her swallow evaluation -Pending MRI/MRA of the brain, echocardiogram, carotid Doppler, hemoglobin A1c, lipid panel -Will also consult PT, OT, speech therapy for evaluation and treatment -Patient does have risk factors including diabetes mellitus, hypertension  End-stage disease requiring hemodialysis -Patient dialyze on Tuesday, Thursday, Friday -Will consult nephrology  Diabetes mellitus -Will continue patient's Lantus, placed on insulin sliding scale -Hemoglobin A1c pending -Currently n.p.o. secondary to possible stroke, speech consulted for evaluation  Hypertension -Will hold antihypertensive medications, will allow permissive hypertension  DVT prophylaxis: Heparin  Code Status: Full  Condition: Guarded  Family Communication: None at bedside. Admission, patients condition and plan of care including tests being ordered have been discussed with the patient, who indicates understanding and agrees with the plan and Code Status.  Disposition Plan: Admitted   Time spent: 60 minutes  Emmaline Wahba D.O. [facility] Pager [phone]  If 7PM-7AM, please contact night-coverage [email or web address] Password [email or web address] 03/29/2014, 5:35 PM

## 2014-03-29 NOTE — Code Documentation (Signed)
38 year old ESRD patient presents to [facility] at triage with stroke like sx.  She is alert - she states at 1456 today she was going to drink some water and noticed difficulty swallowing - she then looked into the mirror and noticed a right facial droop. She also reported a headache and her  Bp was elevated she reports at 157/112 - she proceeded to take extra doses of her BP meds - 3 tabs of Labetalol total of 900 mg and her Norvasc 10 mg and an asprin - she reports she did not take her morning meds.   No problems noted with speech at the time - no extremity weakness reported.  She drove herself to the hospital and walked to triage.  On exam she is alert warm and dry has right facial droop with eyebrow involvement also. She has mild sensation loss on the right side of the face.  No other deficits noted.  Her CBG was 214. Her facial droop is resolving some - upper lids with better movement.  BP is 162/96.  Denies headache currently. HOB was elevated for swallow study - she coughed with sip of water - failed swallow - SLP to see.  Too good to treat - Dr. Leonie Man present - talking with patient re: Prisms trial - patient declined.  Remains in window for tpa treatment until 1925.  Handoff to [facility].

## 2014-03-29 NOTE — Progress Notes (Signed)
Called and received report on pt from [employer] at [phone]. Room is in process of being cleaned, will call RN Kim back when room is ready.

## 2014-03-29 NOTE — ED Notes (Signed)
Pt still in MRI 

## 2014-03-30 ENCOUNTER — Encounter (HOSPITAL_COMMUNITY): Payer: Self-pay | Admitting: Nephrology

## 2014-03-30 DIAGNOSIS — R7309 Other abnormal glucose: Secondary | ICD-10-CM

## 2014-03-30 DIAGNOSIS — I6350 Cerebral infarction due to unspecified occlusion or stenosis of unspecified cerebral artery: Secondary | ICD-10-CM | POA: Diagnosis not present

## 2014-03-30 DIAGNOSIS — R29810 Facial weakness: Secondary | ICD-10-CM

## 2014-03-30 LAB — RENAL FUNCTION PANEL
ALBUMIN: 3.9 g/dL (ref 3.5–5.2)
Anion gap: 22 — ABNORMAL HIGH (ref 5–15)
BUN: 42 mg/dL — ABNORMAL HIGH (ref 6–23)
CALCIUM: 9.5 mg/dL (ref 8.4–10.5)
CO2: 24 mEq/L (ref 19–32)
Chloride: 90 mEq/L — ABNORMAL LOW (ref 96–112)
Creatinine, Ser: 10.68 mg/dL — ABNORMAL HIGH (ref 0.50–1.10)
GFR, EST AFRICAN AMERICAN: 5 mL/min — AB (ref >90)
GFR, EST NON AFRICAN AMERICAN: 4 mL/min — AB (ref >90)
Glucose, Bld: 196 mg/dL — ABNORMAL HIGH (ref 70–99)
Phosphorus: 7.4 mg/dL — ABNORMAL HIGH (ref 2.3–4.6)
Potassium: 4.5 mEq/L (ref 3.7–5.3)
SODIUM: 136 meq/L — AB (ref 137–147)

## 2014-03-30 LAB — LIPID PANEL
Cholesterol: 187 mg/dL (ref 0–200)
Cholesterol: 202 mg/dL — ABNORMAL HIGH (ref 0–200)
HDL: 64 mg/dL (ref >39)
HDL: 67 mg/dL (ref >39)
LDL CALC: 88 mg/dL (ref 0–99)
LDL CALC: 100 mg/dL — AB (ref 0–99)
Total CHOL/HDL Ratio: 2.9 RATIO
Total CHOL/HDL Ratio: 3 RATIO
Triglycerides: 173 mg/dL — ABNORMAL HIGH (ref <150)
Triglycerides: 176 mg/dL — ABNORMAL HIGH (ref <150)
VLDL: 35 mg/dL (ref 0–40)
VLDL: 35 mg/dL (ref 0–40)

## 2014-03-30 LAB — HEMOGLOBIN A1C
HEMOGLOBIN A1C: 10.6 % — AB (ref <5.7)
Hgb A1c MFr Bld: 10.3 % — ABNORMAL HIGH (ref <5.7)
MEAN PLASMA GLUCOSE: 258 mg/dL — AB (ref <117)
Mean Plasma Glucose: 249 mg/dL — ABNORMAL HIGH (ref <117)

## 2014-03-30 LAB — CBC
HCT: 35.1 % — ABNORMAL LOW (ref 36.0–46.0)
Hemoglobin: 11.9 g/dL — ABNORMAL LOW (ref 12.0–15.0)
MCH: 28.9 pg (ref 26.0–34.0)
MCHC: 33.9 g/dL (ref 30.0–36.0)
MCV: 85.2 fL (ref 78.0–100.0)
PLATELETS: 199 10*3/uL (ref 150–400)
RBC: 4.12 MIL/uL (ref 3.87–5.11)
RDW: 14.2 % (ref 11.5–15.5)
WBC: 5.1 10*3/uL (ref 4.0–10.5)

## 2014-03-30 LAB — GLUCOSE, CAPILLARY
GLUCOSE-CAPILLARY: 177 mg/dL — AB (ref 70–99)
GLUCOSE-CAPILLARY: 206 mg/dL — AB (ref 70–99)
GLUCOSE-CAPILLARY: 219 mg/dL — AB (ref 70–99)
Glucose-Capillary: 213 mg/dL — ABNORMAL HIGH (ref 70–99)
Glucose-Capillary: 168 mg/dL — ABNORMAL HIGH (ref 70–99)
Glucose-Capillary: 117 mg/dL — ABNORMAL HIGH (ref 70–99)
Glucose-Capillary: 313 mg/dL — ABNORMAL HIGH (ref 70–99)

## 2014-03-30 MED ORDER — PENTAFLUOROPROP-TETRAFLUOROETH EX AERO: 1.0000 "application " | INHALATION_SPRAY | CUTANEOUS | Status: DC | PRN

## 2014-03-30 MED ORDER — LIDOCAINE-PRILOCAINE 2.5-2.5 % EX CREA: 1.0000 "application " | TOPICAL_CREAM | CUTANEOUS | Status: DC | PRN

## 2014-03-30 MED ORDER — NEPRO/CARBSTEADY PO LIQD: 237.0000 mL | ORAL | Status: DC | PRN

## 2014-03-30 MED ORDER — HYDRALAZINE HCL 20 MG/ML IJ SOLN
10.0000 mg | Freq: Four times a day (QID) | INTRAMUSCULAR | Status: DC | PRN
  Administered 2014-03-30: 10 mg via INTRAVENOUS
  Filled 2014-03-30: qty 1

## 2014-03-30 MED ORDER — SODIUM CHLORIDE 0.9 % IV SOLN: 100.0000 mL | INTRAVENOUS | Status: DC | PRN

## 2014-03-30 MED ORDER — DOXERCALCIFEROL 4 MCG/2ML IV SOLN: 3.0000 ug | INTRAVENOUS | Status: DC

## 2014-03-30 MED ORDER — LIDOCAINE HCL (PF) 1 % IJ SOLN: 5.0000 mL | INTRAMUSCULAR | Status: DC | PRN

## 2014-03-30 MED ORDER — HEPARIN SODIUM (PORCINE) 1000 UNIT/ML DIALYSIS
2000.0000 [IU] | Freq: Once | INTRAMUSCULAR | Status: AC
  Administered 2014-03-30: 2000 [IU] via INTRAVENOUS_CENTRAL

## 2014-03-30 MED ORDER — HEPARIN SODIUM (PORCINE) 1000 UNIT/ML DIALYSIS: 1000.0000 [IU] | INTRAMUSCULAR | Status: DC | PRN

## 2014-03-30 MED ORDER — ALTEPLASE 2 MG IJ SOLR
2.0000 mg | Freq: Once | INTRAMUSCULAR | Status: DC | PRN
  Filled 2014-03-30: qty 2

## 2014-03-30 NOTE — Progress Notes (Signed)
[facility]                                                                              Patient Demographics  [name], is a 38 y.o. female, DOB - 11/21/1975, [id number]  Admit date - 03/29/2014   Admitting Physician Cristal Ford, DO  Outpatient Primary MD for the patient is OSEI-BONSU,GEORGE, MD  LOS - 1   Chief Complaint  Patient presents with  . stroke -like symptoms       HPI on 03/29/2014 [name] is a 38 y.o. female with a history of end-stage renal disease requiring hemodialysis, diabetes mellitus, hypertension, hyperlipidemia that presented to the emergency department with complaints of right-sided facial droop. Patient was standing at the bus stop waiting for her child at approximately 2:56 PM, at which point she was drinking water and felt a drooping of her mouth. Patient home, looked in the near abdominal her smile is asymmetric. Patient denied any weakness or numbness in her extremities. Patient did state that prior to this episode, she did feel a headache in the front of her head which was controlled. At the time of admission, patients headache resolved. Patient also had some dizziness prior to emergency Department arrival which had also resolved. Patient reported she had missed 2 doses of her blood pressure medications on the day of admission, at breakfast as well as lunch time. When patient arrived home, she noted her blood pressure was 157/112, at that time decided to take 3 labetalol pills for a total dose of 900 mg. She also took her 10 mg amlodipine pill as well. Patient also took 281 mg aspirins. Upon arrival to the emergency department, patient was noted to be a code stroke. She was not given TPA. CT of the head did not show any acute abnormalities. Neurology was consulted by the emergency room physician.   Assessment & Plan   Right-sided facial droop, suspect CVA  -CT of the head: No acute intracranial abnormality.  -MRI of brain: No acute  intracranial abnormality -Neurology consulted and appreciated  -Will continue rectal aspirin until patient is able to pass her swallow evaluation  -Pending echocardiogram, carotid Doppler, hemoglobin A1c -LDL 100 -Will also consult PT, OT, speech therapy for evaluation and treatment  -Patient does have risk factors including diabetes mellitus, hypertension   End-stage disease requiring hemodialysis  -Patient dialyze on Tuesday, Thursday, Friday  -Nephrology consulted and appreciated  Diabetes mellitus  -Continue Lantus, placed on insulin sliding scale  -Hemoglobin A1c pending  -Currently n.p.o. secondary to possible stroke, speech consulted for evaluation   Hypertension  -Will hold antihypertensive medications, will allow permissive hypertension  Code Status: Full  Family Communication: None at bedside.   Disposition Plan: Admitted, pending workup  Time Spent in minutes   30 minutes  Procedures  None  Consults   Nephrology Neurology  DVT Prophylaxis  Heparin  Lab Results  Component Value Date   PLT 199 03/30/2014    Medications  Scheduled Meds: . aspirin  300 mg Rectal Daily   Or  . aspirin  325 mg Oral Daily  . doxercalciferol  3 mcg Intravenous Q T,Th,Sa-HD  . heparin  5,000 Units Subcutaneous  3 times per day  . Influenza vac split quadrivalent PF  0.5 mL Intramuscular Tomorrow-1000  . insulin aspart  0-9 Units Subcutaneous 6 times per day   Continuous Infusions:  PRN Meds:.sodium chloride, sodium chloride, albuterol, alteplase, feeding supplement (NEPRO CARB STEADY), heparin, lidocaine (PF), lidocaine-prilocaine, pentafluoroprop-tetrafluoroeth, senna-docusate  Antibiotics    Anti-infectives   None       Subjective:   [name] seen and examined today in dialysis..  Patient denies any headache, dizziness, shortness of breath, chest pain. She does state she is very hungry.  Objective:   Filed Vitals:   03/30/14 1015 03/30/14 1030 03/30/14  1049 03/30/14 1052  BP: 168/91 163/95 164/80 160/78  Pulse: 92 91 92 93  Temp:      TempSrc:      Resp:      Height:      Weight:      SpO2:        Wt Readings from Last 3 Encounters:  03/30/14 51.5 kg (113 lb 8.6 oz)  12/29/13 52 kg (114 lb 10.2 oz)  11/30/13 52.164 kg (115 lb)     Intake/Output Summary (Last 24 hours) at 03/30/14 1208 Last data filed at 03/30/14 0800  Gross per 24 hour  Intake      0 ml  Output      0 ml  Net      0 ml    Exam  General: Well developed, well nourished, NAD, appears stated age  HEENT: NCAT, mucous membranes moist.   Neck: Supple, no JVD, no masses  Cardiovascular: S1 S2 auscultated, 1/6 SEM. Regular rate and rhythm.  Respiratory: Clear to auscultation bilaterally with equal chest rise  Abdomen: Soft, nontender, nondistended, + bowel sounds  Extremities: warm dry without cyanosis clubbing or edema, LUE AVF +bruit/thrill  Neuro: AAOx3, cranial nerves grossly intact. Strength 5/5 in patient's upper and lower extremities bilaterally, right sided facial droop-improved  Psych: Normal affect and demeanor with intact judgement and insight  Data Review   Micro Results No results found for this or any previous visit (from the past 240 hour(s)).  Radiology Reports Dg Chest 2 View  03/29/2014   CLINICAL DATA:  Stroke.  EXAM: CHEST  2 VIEW  COMPARISON:  12/10/2012  FINDINGS: The heart size and mediastinal contours are within normal limits. Both lungs are clear. The visualized skeletal structures are unremarkable. Surgical clips in the left axilla.  IMPRESSION: No active cardiopulmonary disease.   Electronically Signed   By: Lucienne Capers M.D.   On: 03/29/2014 21:29   Ct Head Wo Contrast  03/29/2014   CLINICAL DATA:  Acute stroke.  Right facial droop.  EXAM: CT HEAD WITHOUT CONTRAST  TECHNIQUE: Contiguous axial images were obtained from the base of the skull through the vertex without intravenous contrast.  COMPARISON:  01/29/2013   FINDINGS: There is no evidence of intracranial hemorrhage, brain edema, or other signs of acute infarction. There is no evidence of intracranial mass lesion or mass effect. No abnormal extraaxial fluid collections are identified.  Mild generalized cerebral atrophy appears stable. Ventricles are stable in size. No skull abnormality identified.  IMPRESSION: No acute intracranial abnormality.  Stable mild cerebral atrophy.  These results were called by telephone at the time of interpretation on 03/29/2014 at 4:23 pm to Dr. Armida Sans , who verbally acknowledged these results.   Electronically Signed   By: Earle Gell M.D.   On: 03/29/2014 16:23   Mr Jodene Nam Head Wo Contrast  03/29/2014  CLINICAL DATA:  Right-sided facial droop.  Stroke.  EXAM: MRI HEAD WITHOUT CONTRAST  MRA HEAD WITHOUT CONTRAST  TECHNIQUE: Multiplanar, multiecho pulse sequences of the brain and surrounding structures were obtained without intravenous contrast. Angiographic images of the head were obtained using MRA technique without contrast.  COMPARISON:  CT head from the same day.  FINDINGS: MRI HEAD FINDINGS  Diffusion-weighted images demonstrate no evidence for acute or subacute infarction. No hemorrhage or mass lesion is present. Moderate generalized atrophy is advanced for age. There is no significant focal white matter disease. Flow is present in the major intracranial arteries. The globes and orbits are intact. The paranasal sinuses and mastoid air cells are clear.  MRA HEAD FINDINGS  The internal carotid arteries are within normal limits from the high cervical segments through the ICA termini bilaterally. The A1 and M1 segments are normal. The left A1 segment is dominant. The anterior communicating artery is patent. The MCA bifurcations are within normal limits bilaterally. The ACA and MCA branch vessels are within normal limits.  The left vertebral artery is the dominant vessel. The right PICA origin is visualized and normal. Prominent AICA vessels  are Norma within normal limits bilaterally. The basilar artery is normal. The left posterior cerebral artery is predominantly fed by the A1 segment P1 segment with contribution from the posterior communicating artery. The right posterior cerebral artery is predominantly fed by the posterior communicating artery with contribution from the right P1 segment. The PCA branch vessels are within normal limits bilaterally.  IMPRESSION: 1. No acute intracranial abnormality. 2. Moderate age advanced atrophy. 3. Normal variant MRA circle of Willis without evidence for significant proximal stenosis, aneurysm, or branch vessel occlusion.   Electronically Signed   By: Lawrence Santiago M.D.   On: 03/29/2014 20:13   Mr Brain Wo Contrast  03/29/2014   CLINICAL DATA:  Right-sided facial droop.  Stroke.  EXAM: MRI HEAD WITHOUT CONTRAST  MRA HEAD WITHOUT CONTRAST  TECHNIQUE: Multiplanar, multiecho pulse sequences of the brain and surrounding structures were obtained without intravenous contrast. Angiographic images of the head were obtained using MRA technique without contrast.  COMPARISON:  CT head from the same day.  FINDINGS: MRI HEAD FINDINGS  Diffusion-weighted images demonstrate no evidence for acute or subacute infarction. No hemorrhage or mass lesion is present. Moderate generalized atrophy is advanced for age. There is no significant focal white matter disease. Flow is present in the major intracranial arteries. The globes and orbits are intact. The paranasal sinuses and mastoid air cells are clear.  MRA HEAD FINDINGS  The internal carotid arteries are within normal limits from the high cervical segments through the ICA termini bilaterally. The A1 and M1 segments are normal. The left A1 segment is dominant. The anterior communicating artery is patent. The MCA bifurcations are within normal limits bilaterally. The ACA and MCA branch vessels are within normal limits.  The left vertebral artery is the dominant vessel. The right  PICA origin is visualized and normal. Prominent AICA vessels are Norma within normal limits bilaterally. The basilar artery is normal. The left posterior cerebral artery is predominantly fed by the A1 segment P1 segment with contribution from the posterior communicating artery. The right posterior cerebral artery is predominantly fed by the posterior communicating artery with contribution from the right P1 segment. The PCA branch vessels are within normal limits bilaterally.  IMPRESSION: 1. No acute intracranial abnormality. 2. Moderate age advanced atrophy. 3. Normal variant MRA circle of Willis without evidence for significant proximal  stenosis, aneurysm, or branch vessel occlusion.   Electronically Signed   By: Lawrence Santiago M.D.   On: 03/29/2014 20:13    CBC  Recent Labs Lab 03/29/14 1622 03/29/14 1645 03/29/14 2224 03/30/14 0758  WBC 4.9  --  5.9 5.1  HGB 13.2 14.6 11.5* 11.9*  HCT 38.7 43.0 33.6* 35.1*  PLT 212  --  211 199  MCV 84.3  --  83.8 85.2  MCH 28.8  --  28.7 28.9  MCHC 34.1  --  34.2 33.9  RDW 14.2  --  14.2 14.2  LYMPHSABS 1.8  --   --   --   MONOABS 0.2  --   --   --   EOSABS 0.2  --   --   --   BASOSABS 0.0  --   --   --     Chemistries   Recent Labs Lab 03/29/14 1622 03/29/14 1645 03/29/14 2224 03/30/14 0758  NA 134* 131*  --  136*  K 4.7 5.0  --  4.5  CL 88* 98  --  90*  CO2 22  --   --  24  GLUCOSE 233* 251*  --  196*  BUN 33* 46*  --  42*  CREATININE 8.60* 9.90* 9.51* 10.68*  CALCIUM 10.2  --   --  9.5  AST 20  --   --   --   ALT 12  --   --   --   ALKPHOS 124*  --   --   --   BILITOT 0.3  --   --   --    ------------------------------------------------------------------------------------------------------------------ estimated creatinine clearance is 5.8 ml/min (by C-G formula based on Cr of 10.68). ------------------------------------------------------------------------------------------------------------------  Recent Labs  03/29/14 1622  03/30/14 0542  HGBA1C 10.6* 10.3*   ------------------------------------------------------------------------------------------------------------------  Recent Labs  03/30/14 03/30/14 0542  CHOL 187 202*  HDL 64 67  LDLCALC 88 100*  TRIG 173* 176*  CHOLHDL 2.9 3.0   ------------------------------------------------------------------------------------------------------------------ No results found for this basename: TSH, T4TOTAL, FREET3, T3FREE, THYROIDAB,  in the last 72 hours ------------------------------------------------------------------------------------------------------------------ No results found for this basename: VITAMINB12, FOLATE, FERRITIN, TIBC, IRON, RETICCTPCT,  in the last 72 hours  Coagulation profile  Recent Labs Lab 03/29/14 1622  INR 1.04    No results found for this basename: DDIMER,  in the last 72 hours  Cardiac Enzymes No results found for this basename: CK, CKMB, TROPONINI, MYOGLOBIN,  in the last 168 hours ------------------------------------------------------------------------------------------------------------------ No components found with this basename: POCBNP,     Fabiha Rougeau D.O. on 03/30/2014 at 12:08 PM  Between 7am to 7pm - Pager - [phone]  After 7pm go to [email or web address] - password [email or web address]  And look for the night coverage person covering for me after hours  [facility] Office  [phone]

## 2014-03-30 NOTE — Evaluation (Signed)
Speech Language Pathology Evaluation Patient Details Name: [name] MRN: [id number] DOB: [birth year] Today's Date: 03/30/2014 Time: [id number] SLP Time Calculation (min): 12 min  Problem List:  Patient Active Problem List   Diagnosis Date Noted  . CVA (cerebral infarction) 03/29/2014  . Facial droop 03/29/2014  . Mechanical complication of other vascular device, implant, and graft 01/12/2013  . Other complications due to renal dialysis device, implant, and graft 12/08/2012  . End stage renal disease 04/18/2011   Past Medical History:  Past Medical History  Diagnosis Date  . Chronic kidney disease   . Diabetes mellitus   . Hypertension   . Hyperlipidemia   . Anemia   . GERD (gastroesophageal reflux disease)   . Arthritis   . History of blood transfusion    Past Surgical History:  Past Surgical History  Procedure Laterality Date  . Arteriovenous graft placement    . Cesarean section    . Eye surgery      LAzer  . Artery repair Left 12/10/2012    Procedure: BRACHIAL ARTERY REPAIR;  Surgeon: Angelia Mould, MD;  Location: [facility] OR;  Service: Vascular;  Laterality: Left;  Exploration Left Brachial Artery for AVF   HPI:  [name] is a 38 y.o. female with multiple medical problems including uncontrolled HT and ESRD on HD, who presented with dizziness, increased BP and right lower face weakness. Per MD, likely due to small left brain subcortical infarct not visualized on MRI due to small vessel disease.   Assessment / Plan / Recommendation Clinical Impression  Pt appears to be at her cognitive-linguistic baseline, with no subjective complaints of difficulty. Mild right-sided facial droop and labial weakness is noted, however this does not impact her intelligibility of speech. No acute SLP follow up recommended at this time.     SLP Assessment  Patient does not need any further Speech Lanaguage Pathology Services    Follow Up Recommendations  None     Frequency and Duration        Pertinent Vitals/Pain Pain Assessment: No/denies pain   SLP Goals  SLP Goals Progress/Goals/Alternative treatment plan discussed with pt/caregiver and they: Agree  SLP Evaluation Prior Functioning  Cognitive/Linguistic Baseline: Within functional limits Type of Home: House  Lives With: Daughter (18 yo) Available Help at Discharge: Family;Neighbor;Available PRN/intermittently   Cognition  Overall Cognitive Status: Within Functional Limits for tasks assessed Orientation Level: Oriented X4    Comprehension  Auditory Comprehension Overall Auditory Comprehension: Appears within functional limits for tasks assessed Visual Recognition/Discrimination Discrimination: Within Function Limits Reading Comprehension Reading Status: Not tested    Expression Expression Primary Mode of Expression: Verbal Verbal Expression Overall Verbal Expression: Appears within functional limits for tasks assessed Written Expression Written Expression: Not tested   Oral / Motor Oral Motor/Sensory Function Overall Oral Motor/Sensory Function: Impaired Labial ROM: Reduced right Labial Symmetry: Abnormal symmetry right Labial Strength: Reduced Lingual ROM: Within Functional Limits Lingual Symmetry: Within Functional Limits Lingual Strength: Within Functional Limits Facial ROM: Reduced right Facial Symmetry: Right droop Facial Strength: Reduced Mandible: Impaired Motor Speech Overall Motor Speech: Appears within functional limits for tasks assessed   GO      Germain Osgood, M.A. CCC-SLP [phone]  Germain Osgood 03/30/2014, 3:53 PM

## 2014-03-30 NOTE — Evaluation (Signed)
Clinical/Bedside Swallow Evaluation Patient Details  Name: [name] MRN: [id number] Date of Birth: [birth year]  Today's Date: 03/30/2014 Time: 1513-1523 SLP Time Calculation (min): 10 min  Past Medical History:  Past Medical History  Diagnosis Date  . Chronic kidney disease   . Diabetes mellitus   . Hypertension   . Hyperlipidemia   . Anemia   . GERD (gastroesophageal reflux disease)   . Arthritis   . History of blood transfusion    Past Surgical History:  Past Surgical History  Procedure Laterality Date  . Arteriovenous graft placement    . Cesarean section    . Eye surgery      LAzer  . Artery repair Left 12/10/2012    Procedure: BRACHIAL ARTERY REPAIR;  Surgeon: Angelia Mould, MD;  Location: [facility] OR;  Service: Vascular;  Laterality: Left;  Exploration Left Brachial Artery for AVF   HPI:  [name] is a 38 y.o. female with multiple medical problems including uncontrolled HT and ESRD on HD, who presented with dizziness, increased BP and right lower face weakness. Per MD, likely due to small left brain subcortical infarct not visualized on MRI due to small vessel disease.   Assessment / Plan / Recommendation Clinical Impression  Pt exhibits mild right-sided weakness and asymmetry, which does not appear to impact her functional swallow. Pt consumed regular textures and thin liquids with no evidence of aspiration, with no anterior loss or residuals noted. Recommend to initiate regular diet, thin liquids without further SLP follow up. SLP provided education about monitoring her right side during meals given mild weakness.    Aspiration Risk  Mild    Diet Recommendation Regular;Thin liquid   Liquid Administration via: Cup;Straw Medication Administration: Whole meds with liquid Supervision: Patient able to self feed Compensations: Slow rate;Small sips/bites;Follow solids with liquid;Check for anterior loss;Check for pocketing Postural Changes and/or  Swallow Maneuvers: Seated upright 90 degrees;Upright 30-60 min after meal    Other  Recommendations Oral Care Recommendations: Oral care BID   Follow Up Recommendations  None    Frequency and Duration        Pertinent Vitals/Pain n/a    SLP Swallow Goals     Swallow Study Prior Functional Status       General Date of Onset: 03/29/14 HPI: [name] is a 38 y.o. female with multiple medical problems including uncontrolled HT and ESRD on HD, who presented with dizziness, increased BP and right lower face weakness. Per MD, likely due to small left brain subcortical infarct not visualized on MRI due to small vessel disease. Type of Study: Bedside swallow evaluation Previous Swallow Assessment: none in chart Diet Prior to this Study: NPO Temperature Spikes Noted: No Respiratory Status: Room air History of Recent Intubation: No Behavior/Cognition: Alert;Cooperative;Pleasant mood Oral Cavity - Dentition: Adequate natural dentition Self-Feeding Abilities: Able to feed self Patient Positioning: Other (comment) (sitting upright EOB) Baseline Vocal Quality: Clear Volitional Swallow: Able to elicit    Oral/Motor/Sensory Function Overall Oral Motor/Sensory Function: Impaired Labial ROM: Reduced right Labial Symmetry: Abnormal symmetry right Labial Strength: Reduced Lingual ROM: Within Functional Limits Lingual Symmetry: Within Functional Limits Lingual Strength: Within Functional Limits Facial ROM: Reduced right Facial Symmetry: Right droop Facial Strength: Reduced Mandible: Impaired   Ice Chips Ice chips: Not tested   Thin Liquid Thin Liquid: Within functional limits Presentation: Cup;Self Fed;Straw    Nectar Thick Nectar Thick Liquid: Not tested   Honey Thick Honey Thick Liquid: Not tested  Puree Puree: Within functional limits Presentation: Self Fed;Spoon   Solid   GO    Solid: Within functional limits Presentation: Self Fed        [name],  M.A. CCC-SLP [phone]  [name] 03/30/2014,3:48 PM

## 2014-03-30 NOTE — Progress Notes (Signed)
OT Cancellation Note  Patient Details Name: [name] MRN: [id number] DOB: February 24, 1976   Cancelled Treatment:     Unable to complete OT evaluation at this time as pt is off floor for HD. OT will follow up with pt to complete evaluation as available.   Juluis Rainier [id number] 03/30/2014, 8:09 AM

## 2014-03-30 NOTE — Progress Notes (Signed)
UR complete.  Farra Nikolic RN, MSN 

## 2014-03-30 NOTE — Consult Note (Signed)
Indication for Consultation:  Management of ESRD/hemodialysis; anemia, hypertension/volume and secondary hyperparathyroidism  HPI: [name] is a 38 y.o. female who presented to the ED yesterday afternoon after experiencing facial droop and dizziness. She receives HD TTS @ [facility].  History of HTN and DM. She had been feeling well until yesterday around 3pm when she was trying to drink water but the water was dripping out of her mouth, when she looked in the mirror she noticed facial droop. She checked her BP at this time, it was 157/112, she took 900mg  labetalol, 10mg  norvasc and ASA and then proceeded to drive herself to the hospital. She reports missing a lot of doses of BP medication in the past week, including missing 2 doses yesterday, due to being extremely fatigued and taking naps. She drove to [place] for the long weekend and back, and thinks she is just wiped out from the long drive. She did not take frequent breaks while driving, denies calf pain, LE edema.  HD was arranged for today to keep her on schedule.   Past Medical History  Diagnosis Date  . Chronic kidney disease   . Diabetes mellitus   . Hypertension   . Hyperlipidemia   . Anemia   . GERD (gastroesophageal reflux disease)   . Arthritis   . History of blood transfusion    Past Surgical History  Procedure Laterality Date  . Arteriovenous graft placement    . Cesarean section    . Eye surgery      LAzer  . Artery repair Left 12/10/2012    Procedure: BRACHIAL ARTERY REPAIR;  Surgeon: Angelia Mould, MD;  Location: [facility] OR;  Service: Vascular;  Laterality: Left;  Exploration Left Brachial Artery for AVF   Family History  Problem Relation Age of Onset  . Cancer Mother     breast  . Hypertension Father    Social History:  reports that she has never smoked. She has never used smokeless tobacco. She reports that she does not drink alcohol or use illicit drugs. Allergies  Allergen Reactions  . Pollen Extract Other  (See Comments)    Watery eyes   Prior to Admission medications   Medication Sig Start Date End Date Taking? Authorizing Provider  albuterol (PROVENTIL HFA;VENTOLIN HFA) 108 (90 BASE) MCG/ACT inhaler Inhale 2 puffs into the lungs every 6 (six) hours as needed for wheezing or shortness of breath.    Yes Historical Provider, MD  amLODipine (NORVASC) 10 MG tablet Take 10 mg by mouth daily.    Yes Historical Provider, MD  aspirin EC 81 MG tablet Take 81 mg by mouth daily.   Yes Historical Provider, MD  calcium acetate (PHOSLO) 667 MG capsule Take 667 mg by mouth 3 (three) times daily with meals.     Yes Historical Provider, MD  GLUCAGON EMERGENCY 1 MG injection Inject 1 mg into the muscle once as needed (for severe low blood sugar).  11/22/12  Yes Historical Provider, MD  glucose 4 GM chewable tablet Chew 4-16 g by mouth as needed for low blood sugar.    Yes Historical Provider, MD  insulin glargine (LANTUS) 100 UNIT/ML injection Inject 10 Units into the skin at bedtime.    Yes Historical Provider, MD  insulin lispro (HUMALOG) 100 UNIT/ML injection Inject 3-15 Units into the skin 3 (three) times daily before meals. Per sliding scale   Yes Historical Provider, MD  labetalol (NORMODYNE) 300 MG tablet Take 600 mg by mouth 3 (three) times daily.  Yes Historical Provider, MD  lidocaine-prilocaine (EMLA) cream Apply 1 application topically as needed. Prior to dialysis 03/16/14  Yes Historical Provider, MD  medroxyPROGESTERone (DEPO-PROVERA) 150 MG/ML injection Inject 150 mg into the muscle every 3 (three) months.     Yes Historical Provider, MD  Pediatric Multiple Vit-C-FA (FLINSTONES GUMMIES OMEGA-3 DHA) CHEW Chew 1 tablet by mouth daily.    Yes Historical Provider, MD  rosuvastatin (CRESTOR) 10 MG tablet Take 10 mg by mouth daily.    Yes Historical Provider, MD   Current Facility-Administered Medications  Medication Dose Route Frequency Provider Last Rate Last Dose  . 0.9 %  sodium chloride infusion  100  mL Intravenous PRN Dwana Melena      . 0.9 %  sodium chloride infusion  100 mL Intravenous PRN Dwana Melena      . albuterol (PROVENTIL) (2.5 MG/3ML) 0.083% nebulizer solution 2.5 mg  2.5 mg Nebulization Q6H PRN Maryann Mikhail, DO      . alteplase (CATHFLO ACTIVASE) injection 2 mg  2 mg Intracatheter Once PRN Dwana Melena      . aspirin suppository 300 mg  300 mg Rectal Daily Maryann Mikhail, DO       Or  . aspirin tablet 325 mg  325 mg Oral Daily Maryann Mikhail, DO      . feeding supplement (NEPRO CARB STEADY) liquid 237 mL  237 mL Oral PRN Dwana Melena      . heparin injection 1,000 Units  1,000 Units Dialysis PRN Dwana Melena      . heparin injection 5,000 Units  5,000 Units Subcutaneous 3 times per day Cristal Ford, DO   5,000 Units at 03/30/14 0600  . Influenza vac split quadrivalent PF (FLUARIX) injection 0.5 mL  0.5 mL Intramuscular Tomorrow-1000 Maryann Mikhail, DO      . insulin aspart (novoLOG) injection 0-9 Units  0-9 Units Subcutaneous 6 times per day Maryann Mikhail, DO      . lidocaine (PF) (XYLOCAINE) 1 % injection 5 mL  5 mL Intradermal PRN Dwana Melena      . lidocaine-prilocaine (EMLA) cream 1 application  1 application Topical PRN Dwana Melena      . pentafluoroprop-tetrafluoroeth (GEBAUERS) aerosol 1 application  1 application Topical PRN Dwana Melena      . senna-docusate (Senokot-S) tablet 1 tablet  1 tablet Oral QHS PRN Cristal Ford, DO       Labs: Basic Metabolic Panel:  Recent Labs Lab 03/29/14 1622 03/29/14 1645 03/29/14 2224 03/30/14 0758  NA 134* 131*  --  136*  K 4.7 5.0  --  4.5  CL 88* 98  --  90*  CO2 22  --   --  24  GLUCOSE 233* 251*  --  196*  BUN 33* 46*  --  42*  CREATININE 8.60* 9.90* 9.51* 10.68*  CALCIUM 10.2  --   --  9.5  PHOS  --   --   --  7.4*   Liver Function Tests:  Recent Labs Lab 03/29/14 1622 03/30/14 0758  AST 20  --   ALT 12  --   ALKPHOS 124*  --   BILITOT 0.3  --   PROT 8.6*  --   ALBUMIN 4.5 3.9   No results found  for this basename: LIPASE, AMYLASE,  in the last 168 hours No results found for this basename: AMMONIA,  in the last 168 hours CBC:  Recent Labs Lab 03/29/14 1622 03/29/14 1645  03/29/14 2224 03/30/14 0758  WBC 4.9  --  5.9 5.1  NEUTROABS 2.7  --   --   --   HGB 13.2 14.6 11.5* 11.9*  HCT 38.7 43.0 33.6* 35.1*  MCV 84.3  --  83.8 85.2  PLT 212  --  211 199   Cardiac Enzymes: No results found for this basename: CKTOTAL, CKMB, CKMBINDEX, TROPONINI,  in the last 168 hours CBG:  Recent Labs Lab 03/29/14 1624 03/29/14 2056 03/30/14 0023 03/30/14 0428  GLUCAP 213* 219* 177* 168*   Iron Studies: No results found for this basename: IRON, TIBC, TRANSFERRIN, FERRITIN,  in the last 72 hours Studies/Results: Dg Chest 2 View  03/29/2014   CLINICAL DATA:  Stroke.  EXAM: CHEST  2 VIEW  COMPARISON:  12/10/2012  FINDINGS: The heart size and mediastinal contours are within normal limits. Both lungs are clear. The visualized skeletal structures are unremarkable. Surgical clips in the left axilla.  IMPRESSION: No active cardiopulmonary disease.   Electronically Signed   By: Lucienne Capers M.D.   On: 03/29/2014 21:29   Ct Head Wo Contrast  03/29/2014   CLINICAL DATA:  Acute stroke.  Right facial droop.  EXAM: CT HEAD WITHOUT CONTRAST  TECHNIQUE: Contiguous axial images were obtained from the base of the skull through the vertex without intravenous contrast.  COMPARISON:  01/29/2013  FINDINGS: There is no evidence of intracranial hemorrhage, brain edema, or other signs of acute infarction. There is no evidence of intracranial mass lesion or mass effect. No abnormal extraaxial fluid collections are identified.  Mild generalized cerebral atrophy appears stable. Ventricles are stable in size. No skull abnormality identified.  IMPRESSION: No acute intracranial abnormality.  Stable mild cerebral atrophy.  These results were called by telephone at the time of interpretation on 03/29/2014 at 4:23 pm to Dr. Armida Sans  , who verbally acknowledged these results.   Electronically Signed   By: Earle Gell M.D.   On: 03/29/2014 16:23   Mr Jodene Nam Head Wo Contrast  03/29/2014   CLINICAL DATA:  Right-sided facial droop.  Stroke.  EXAM: MRI HEAD WITHOUT CONTRAST  MRA HEAD WITHOUT CONTRAST  TECHNIQUE: Multiplanar, multiecho pulse sequences of the brain and surrounding structures were obtained without intravenous contrast. Angiographic images of the head were obtained using MRA technique without contrast.  COMPARISON:  CT head from the same day.  FINDINGS: MRI HEAD FINDINGS  Diffusion-weighted images demonstrate no evidence for acute or subacute infarction. No hemorrhage or mass lesion is present. Moderate generalized atrophy is advanced for age. There is no significant focal white matter disease. Flow is present in the major intracranial arteries. The globes and orbits are intact. The paranasal sinuses and mastoid air cells are clear.  MRA HEAD FINDINGS  The internal carotid arteries are within normal limits from the high cervical segments through the ICA termini bilaterally. The A1 and M1 segments are normal. The left A1 segment is dominant. The anterior communicating artery is patent. The MCA bifurcations are within normal limits bilaterally. The ACA and MCA branch vessels are within normal limits.  The left vertebral artery is the dominant vessel. The right PICA origin is visualized and normal. Prominent AICA vessels are Norma within normal limits bilaterally. The basilar artery is normal. The left posterior cerebral artery is predominantly fed by the A1 segment P1 segment with contribution from the posterior communicating artery. The right posterior cerebral artery is predominantly fed by the posterior communicating artery with contribution from the right P1 segment. The PCA  branch vessels are within normal limits bilaterally.  IMPRESSION: 1. No acute intracranial abnormality. 2. Moderate age advanced atrophy. 3. Normal variant MRA  circle of Willis without evidence for significant proximal stenosis, aneurysm, or branch vessel occlusion.   Electronically Signed   By: Lawrence Santiago M.D.   On: 03/29/2014 20:13   Mr Brain Wo Contrast  03/29/2014   CLINICAL DATA:  Right-sided facial droop.  Stroke.  EXAM: MRI HEAD WITHOUT CONTRAST  MRA HEAD WITHOUT CONTRAST  TECHNIQUE: Multiplanar, multiecho pulse sequences of the brain and surrounding structures were obtained without intravenous contrast. Angiographic images of the head were obtained using MRA technique without contrast.  COMPARISON:  CT head from the same day.  FINDINGS: MRI HEAD FINDINGS  Diffusion-weighted images demonstrate no evidence for acute or subacute infarction. No hemorrhage or mass lesion is present. Moderate generalized atrophy is advanced for age. There is no significant focal white matter disease. Flow is present in the major intracranial arteries. The globes and orbits are intact. The paranasal sinuses and mastoid air cells are clear.  MRA HEAD FINDINGS  The internal carotid arteries are within normal limits from the high cervical segments through the ICA termini bilaterally. The A1 and M1 segments are normal. The left A1 segment is dominant. The anterior communicating artery is patent. The MCA bifurcations are within normal limits bilaterally. The ACA and MCA branch vessels are within normal limits.  The left vertebral artery is the dominant vessel. The right PICA origin is visualized and normal. Prominent AICA vessels are Norma within normal limits bilaterally. The basilar artery is normal. The left posterior cerebral artery is predominantly fed by the A1 segment P1 segment with contribution from the posterior communicating artery. The right posterior cerebral artery is predominantly fed by the posterior communicating artery with contribution from the right P1 segment. The PCA branch vessels are within normal limits bilaterally.  IMPRESSION: 1. No acute intracranial  abnormality. 2. Moderate age advanced atrophy. 3. Normal variant MRA circle of Willis without evidence for significant proximal stenosis, aneurysm, or branch vessel occlusion.   Electronically Signed   By: Lawrence Santiago M.D.   On: 03/29/2014 20:13    Review of Systems: Gen: Reports excessive fatigue this week, taking naps. Denies any fever, chills, sweats, anorexia, weakness, malaise, weight loss, and sleep disorder HEENT: No visual complaints, No history of Retinopathy. Normal external appearance No Epistaxis or Sore throat. No sinusitis.   CV: Denies chest pain, angina, palpitations, syncope, orthopnea, PND, peripheral edema, and claudication. Denies calf pain Resp: Denies dyspnea at rest, dyspnea with exercise, cough, sputum, wheezing, coughing up blood, and pleurisy. GI: Denies vomiting blood, jaundice, and fecal incontinence.   Denies dysphagia or odynophagia. GU : Denies urinary burning, blood in urine, urinary frequency, urinary hesitancy, nocturnal urination, and urinary incontinence.  No renal calculi. MS: Denies joint pain, limitation of movement, and swelling, stiffness, low back pain, extremity pain. Denies muscle weakness, cramps, atrophy.  Derm: Denies rash, itching, dry skin, hives, moles, warts, or unhealing ulcers.  Psych: Denies depression, anxiety, memory loss, suicidal ideation, hallucinations, paranoia, and confusion. Heme: Denies bruising, bleeding, and enlarged lymph nodes. Neuro:  Reports facial droop, dizziness- now resolved. No diplopia. No dysarthria.  No dysphasia.  No history of CVA.  No Seizures. No paresthesias.  No weakness. Endocrine .  No Thyroid disease.  No Adrenal disease.  Physical Exam: Filed Vitals:   03/30/14 0930 03/30/14 1000 03/30/14 1015 03/30/14 1030  BP: 161/93 164/96 168/91 163/95  Pulse: 90  91 92 91  Temp:      TempSrc:      Resp:      Height:      Weight:      SpO2:         General: Well developed, well nourished, in no acute  distress. Head: Normocephalic, atraumatic, sclera non-icteric, mucus membranes are moist. Face symmetrical Neck: Supple. JVD not elevated. No carotid bruit Lungs: Clear bilaterally to auscultation without wheezes, rales, or rhonchi. Breathing is unlabored. Heart: RRR with S1 S2. No murmurs, rubs, or gallops appreciated. Abdomen: Soft, non-tender, non-distended with normoactive bowel sounds. No rebound/guarding. No obvious abdominal masses. M-S:  Strength and tone appear normal for age. Lower extremities:without edema or ischemic changes, no open wounds. No calf tenderness. Neuro: Alert and oriented X 3. Moves all extremities spontaneously. Strength 5/5 upper ext bilat.  Asymmetry of forehead, periocular muscles, nasolabial area , R side weaker than L Psych:  Responds to questions appropriately with a normal affect.  Dialysis Access: L AVG patent on HD  Dialysis Orders:  TTS @ [facility] 3hr 30 min   52kgs  2K/2K  425/1.5   2000 Heparin  L AVG hectorol 79mcg IV/HD  Aranesp 40 q week  No Fe  Assessment/Plan: 1.  R facial weakness, r/o CVA- work up per primary. CT and MRI- no acute abnormality. 2D echo and carotid doppler pending. PT/OT Forehead and periocular asymmetry as well as lower facial weakness, ? Facial nerve deficit 2.  ESRD -  TTS @ [facility]. HD today. K+4.5 3.  Hypertension/volume  - 167/80. Home labetalol and norvasc. Recently missing doses. Refusing any volume removal in HD today.  4.  Anemia  - hgb 11.9 hold Aranesp for now, watch CBC, No Fe.  5.  Metabolic bone disease -  Ca+ 6.5. phos 7.4- resume binders when diet advanced. Cont hectorol.  6.  Nutrition - NPO, failed swallow eval/ Alb 3.9 7. DM- per primary  Shelle Iron, NP [facility] [phone] 03/30/2014, 10:39 AM   Pt seen, examined, agree w assess/plan as above with additions as indicated.  Kelly Splinter MD pager [phone]    cell [phone] 03/30/2014, 4:31 PM

## 2014-03-30 NOTE — Progress Notes (Signed)
PT Cancellation Note  Patient Details  Name: [name]  MRN: [id number]  DOB: 12-22-1975  Cancelled Treatment: Unable to complete PT evaluation at this time as pt is off floor for HD. PT will follow up with pt to complete evaluation as available.    [address] [place], [place]  03/30/2014, 10:05 AM

## 2014-03-30 NOTE — Evaluation (Addendum)
Physical Therapy Evaluation and Discharge Patient Details Name: [name] MRN: [id number] DOB: February 10, 1976 Today's Date: 03/30/2014   History of Present Illness  38 y.o. female with multiple medical problems including uncontrolled HT and ESRD on HD presented with dizziness, increased BP and right lower face weakness.Code stroke was called but due to minor deficits she was not felt to be TPA candidate.  Clinical Impression  Patient evaluated by Physical Therapy with no further acute PT needs identified. All education has been completed and the patient has no further questions. Safely ambulating without an assistive device and able to maintain balance without assistance during challenging dynamic balance activities. BP elevated at end of therapy session (174/88 seated) and (168/91 lying) after several minutes of rest. RN notified. See below for any follow-up Physial Therapy or equipment needs. PT is signing off. Thank you for this referral.     Follow Up Recommendations No PT follow up    Equipment Recommendations  None recommended by PT    Recommendations for Other Services       Precautions / Restrictions Precautions Precautions: Other (comment) (BP <130/90 per MD) Restrictions Weight Bearing Restrictions: No      Mobility  Bed Mobility Overal bed mobility: Modified Independent                Transfers Overall transfer level: Needs assistance Equipment used: None Transfers: Sit to/from Stand Sit to Stand: Supervision         General transfer comment: Supervision for safety. No cues required. performed task safely from lowest bed setting.  Ambulation/Gait Ambulation/Gait assistance: Supervision Ambulation Distance (Feet): 250 Feet Assistive device: None Gait Pattern/deviations: Step-through pattern     General Gait Details: Assessed dynamic gait with balance oriented tasks. No physical assist required. Pt able to perform head turns (horizontal and  vertical), high march, direction change, and variable speed changes without loss of balance.  Stairs            Wheelchair Mobility    Modified Rankin (Stroke Patients Only) Modified Rankin (Stroke Patients Only) Pre-Morbid Rankin Score: No symptoms Modified Rankin: No significant disability ("facial numbness")     Balance Overall balance assessment: Modified Independent                                           Pertinent Vitals/Pain Pain Assessment: No/denies pain    Home Living Family/patient expects to be discharged to:: Private residence Living Arrangements: Children;Other relatives (grandmother to "check-in") Available Help at Discharge: Family;Available PRN/intermittently Type of Home: House Home Access: Level entry     Home Layout: Two level Home Equipment: None      Prior Function Level of Independence: Independent               Hand Dominance   Dominant Hand: Right    Extremity/Trunk Assessment   Upper Extremity Assessment: Defer to OT evaluation           Lower Extremity Assessment: Overall WFL for tasks assessed         Communication   Communication: No difficulties  Cognition Arousal/Alertness: Awake/alert Behavior During Therapy: WFL for tasks assessed/performed Overall Cognitive Status: Within Functional Limits for tasks assessed                      General Comments General comments (skin integrity, edema, etc.): No signficant changes to  patient's strength, ROM or light touch sensation with lower extremities.    Exercises        Assessment/Plan    PT Assessment Patent does not need any further PT services  PT Diagnosis     PT Problem List    PT Treatment Interventions     PT Goals (Current goals can be found in the Care Plan section) Acute Rehab PT Goals Patient Stated Goal: Go home PT Goal Formulation: No goals set, d/c therapy    Frequency     Barriers to discharge         Co-evaluation               End of Session   Activity Tolerance: Patient tolerated treatment well Patient left: in bed;with call bell/phone within reach Nurse Communication: Mobility status;Other (comment) (Alerted RN of pt elevated blood pressure)         Time: [id number] PT Time Calculation (min): 14 min   Charges:   PT Evaluation $Initial PT Evaluation Tier I: 1 Procedure     PT G Codes:         Elayne Snare, [place]  Ellouise Newer 03/30/2014, 4:55 PM  Addendum for d/c

## 2014-03-30 NOTE — Progress Notes (Signed)
Patient requested to leave AMA. She removed TELE and requested for IV to be removed. Writer explained the risks versus benefits.Patient expressed to writer that she drove to the hospital herself. I again explained the risks vs. Benefits of leaving AMA. Patient verbalized understand. MD is aware. MD wants writer redo bedside swallow eval. Per protocol, writer unable to redo bedside swallow eval once failed. Speech Therapy ordered. Per note, speech attempted to see patient this AM but patient was in dialysis. Speech paged again x2. Awaiting for call back.   Ave Filter, RN

## 2014-03-30 NOTE — Progress Notes (Addendum)
Patient requested to leave AMA.Writer explained the risks versus benefits.Patient expressed to Probation officer that she drove her herself. I again explained the risks vs. Benefits of leaving AMA. Patient verbalized understand. MD is aware. MD wants writer redo bedside swallow eval. Per protocol, writer unable to redo bedside swallow eval once failed. Speech Therapy ordered. Per note, speech attempted to see patient this AM but patient was in dialysis. Speech paged again x2. Awaiting for call back.   Ave Filter, RN

## 2014-03-30 NOTE — Progress Notes (Signed)
Speech Pathology Pt in HD, unable to complete assessments at this time.  Herbie [place], [place] CCC-SLP [phone]

## 2014-03-30 NOTE — Progress Notes (Addendum)
Stroke Team Progress Note  HISTORY 38 year lady with multiple medical problems including uncontrolled HT and ESRD on HD presented with dizziness, increased BP and right lower face weakness.Code stroke was called but due to minor deficits she was not felt to be TPA candidate. PRISMS trial was offfered but she decline  . She was admitted to the  Renal floor bed for further evaluation and treatment.  SUBJECTIVE Her RN is at the bedside.  Overall she feels her condition is impr0ved . She denies p/h/o stroke or TIA  OBJECTIVE Most recent Vital Signs: Filed Vitals:   03/30/14 1100 03/30/14 1120 03/30/14 1253 03/30/14 1402  BP: 159/68 160/70 180/90 156/86  Pulse: 92 92 92   Temp:  98.8 F (37.1 C) 99 F (37.2 C)   TempSrc:  Oral Oral   Resp:  15 16   Height:      Weight:  113 lb 8.6 oz (51.5 kg)    SpO2:  100% 100%    CBG (last 3)   Recent Labs  03/30/14 0023 03/30/14 0428 03/30/14 1237  GLUCAP 177* 168* 117*    IV Fluid Intake:     MEDICATIONS  . aspirin  300 mg Rectal Daily   Or  . aspirin  325 mg Oral Daily  . doxercalciferol  3 mcg Intravenous Q T,Th,Sa-HD  . heparin  5,000 Units Subcutaneous 3 times per day  . insulin aspart  0-9 Units Subcutaneous 6 times per day   PRN:  albuterol, hydrALAZINE, senna-docusate  Diet:  NPO   Activity:  Bedrest  DVT Prophylaxis:  SCDs  CLINICALLY SIGNIFICANT STUDIES Basic Metabolic Panel:  Recent Labs Lab 03/29/14 1622 03/29/14 1645 03/29/14 2224 03/30/14 0758  NA 134* 131*  --  136*  K 4.7 5.0  --  4.5  CL 88* 98  --  90*  CO2 22  --   --  24  GLUCOSE 233* 251*  --  196*  BUN 33* 46*  --  42*  CREATININE 8.60* 9.90* 9.51* 10.68*  CALCIUM 10.2  --   --  9.5  PHOS  --   --   --  7.4*   Liver Function Tests:  Recent Labs Lab 03/29/14 1622 03/30/14 0758  AST 20  --   ALT 12  --   ALKPHOS 124*  --   BILITOT 0.3  --   PROT 8.6*  --   ALBUMIN 4.5 3.9   CBC:  Recent Labs Lab 03/29/14 1622  03/29/14 2224  03/30/14 0758  WBC 4.9  --  5.9 5.1  NEUTROABS 2.7  --   --   --   HGB 13.2  < > 11.5* 11.9*  HCT 38.7  < > 33.6* 35.1*  MCV 84.3  --  83.8 85.2  PLT 212  --  211 199  < > = values in this interval not displayed. Coagulation:  Recent Labs Lab 03/29/14 1622  LABPROT 13.6  INR 1.04   Cardiac Enzymes: No results found for this basename: CKTOTAL, CKMB, CKMBINDEX, TROPONINI,  in the last 168 hours Urinalysis:  Recent Labs Lab 03/29/14 1734  COLORURINE YELLOW  LABSPEC 1.015  PHURINE 8.0  GLUCOSEU >1000*  HGBUR SMALL*  BILIRUBINUR NEGATIVE  KETONESUR NEGATIVE  PROTEINUR 100*  UROBILINOGEN 0.2  NITRITE NEGATIVE  LEUKOCYTESUR TRACE*   Lipid Panel    Component Value Date/Time   CHOL 202* 03/30/2014 0542   TRIG 176* 03/30/2014 0542   HDL 67 03/30/2014 0542   CHOLHDL 3.0 03/30/2014  0542   VLDL 35 03/30/2014 0542   LDLCALC 100* 03/30/2014 0542   HgbA1C  Lab Results  Component Value Date   HGBA1C 10.3* 03/30/2014    Urine Drug Screen:     Component Value Date/Time   LABOPIA NONE DETECTED 03/29/2014 1734   COCAINSCRNUR NONE DETECTED 03/29/2014 1734   LABBENZ NONE DETECTED 03/29/2014 1734   AMPHETMU NONE DETECTED 03/29/2014 1734   THCU NONE DETECTED 03/29/2014 1734   LABBARB NONE DETECTED 03/29/2014 1734    Alcohol Level:  Recent Labs Lab 03/29/14 1622  ETH <11    Dg Chest 2 View  03/29/2014   CLINICAL DATA:  Stroke.  EXAM: CHEST  2 VIEW  COMPARISON:  12/10/2012  FINDINGS: The heart size and mediastinal contours are within normal limits. Both lungs are clear. The visualized skeletal structures are unremarkable. Surgical clips in the left axilla.  IMPRESSION: No active cardiopulmonary disease.   Electronically Signed   By: [name] M.D.   On: 03/29/2014 21:29   Ct Head Wo Contrast  03/29/2014   CLINICAL DATA:  Acute stroke.  Right facial droop.  EXAM: CT HEAD WITHOUT CONTRAST  TECHNIQUE: Contiguous axial images were obtained from the base of the skull through the vertex without  intravenous contrast.  COMPARISON:  01/29/2013  FINDINGS: There is no evidence of intracranial hemorrhage, brain edema, or other signs of acute infarction. There is no evidence of intracranial mass lesion or mass effect. No abnormal extraaxial fluid collections are identified.  Mild generalized cerebral atrophy appears stable. Ventricles are stable in size. No skull abnormality identified.  IMPRESSION: No acute intracranial abnormality.  Stable mild cerebral atrophy.  These results were called by telephone at the time of interpretation on 03/29/2014 at 4:23 pm to Dr. Armida Werner , who verbally acknowledged these results.   Electronically Signed   By: [name] M.D.   On: 03/29/2014 16:23   Mr [name] Head Wo Contrast  03/29/2014   CLINICAL DATA:  Right-sided facial droop.  Stroke.  EXAM: MRI HEAD WITHOUT CONTRAST  MRA HEAD WITHOUT CONTRAST  TECHNIQUE: Multiplanar, multiecho pulse sequences of the brain and surrounding structures were obtained without intravenous contrast. Angiographic images of the head were obtained using MRA technique without contrast.  COMPARISON:  CT head from the same day.  FINDINGS: MRI HEAD FINDINGS  Diffusion-weighted images demonstrate no evidence for acute or subacute infarction. No hemorrhage or mass lesion is present. Moderate generalized atrophy is advanced for age. There is no significant focal white matter disease. Flow is present in the major intracranial arteries. The globes and orbits are intact. The paranasal sinuses and mastoid air cells are clear.  MRA HEAD FINDINGS  The internal carotid arteries are within normal limits from the high cervical segments through the ICA termini bilaterally. The A1 and M1 segments are normal. The left A1 segment is dominant. The anterior communicating artery is patent. The MCA bifurcations are within normal limits bilaterally. The ACA and MCA branch vessels are within normal limits.  The left vertebral artery is the dominant vessel. The right PICA origin  is visualized and normal. Prominent AICA vessels are Norma within normal limits bilaterally. The basilar artery is normal. The left posterior cerebral artery is predominantly fed by the A1 segment P1 segment with contribution from the posterior communicating artery. The right posterior cerebral artery is predominantly fed by the posterior communicating artery with contribution from the right P1 segment. The PCA branch vessels are within normal limits bilaterally.  IMPRESSION: 1. No  acute intracranial abnormality. 2. Moderate age advanced atrophy. 3. Normal variant MRA circle of Willis without evidence for significant proximal stenosis, aneurysm, or branch vessel occlusion.   Electronically Signed   By: Lawrence Santiago M.D.   On: 03/29/2014 20:13   Mr Brain Wo Contrast  03/29/2014   CLINICAL DATA:  Right-sided facial droop.  Stroke.  EXAM: MRI HEAD WITHOUT CONTRAST  MRA HEAD WITHOUT CONTRAST  TECHNIQUE: Multiplanar, multiecho pulse sequences of the brain and surrounding structures were obtained without intravenous contrast. Angiographic images of the head were obtained using MRA technique without contrast.  COMPARISON:  CT head from the same day.  FINDINGS: MRI HEAD FINDINGS  Diffusion-weighted images demonstrate no evidence for acute or subacute infarction. No hemorrhage or mass lesion is present. Moderate generalized atrophy is advanced for age. There is no significant focal white matter disease. Flow is present in the major intracranial arteries. The globes and orbits are intact. The paranasal sinuses and mastoid air cells are clear.  MRA HEAD FINDINGS  The internal carotid arteries are within normal limits from the high cervical segments through the ICA termini bilaterally. The A1 and M1 segments are normal. The left A1 segment is dominant. The anterior communicating artery is patent. The MCA bifurcations are within normal limits bilaterally. The ACA and MCA branch vessels are within normal limits.  The left  vertebral artery is the dominant vessel. The right PICA origin is visualized and normal. Prominent AICA vessels are Norma within normal limits bilaterally. The basilar artery is normal. The left posterior cerebral artery is predominantly fed by the A1 segment P1 segment with contribution from the posterior communicating artery. The right posterior cerebral artery is predominantly fed by the posterior communicating artery with contribution from the right P1 segment. The PCA branch vessels are within normal limits bilaterally.  IMPRESSION: 1. No acute intracranial abnormality. 2. Moderate age advanced atrophy. 3. Normal variant MRA circle of Willis without evidence for significant proximal stenosis, aneurysm, or branch vessel occlusion.   Electronically Signed   By: Lawrence Santiago M.D.   On: 03/29/2014 20:13       MRI of the brain  No acute infarct  MRA of the brain  No large vessel stenosis/occlusion  Carotid Doppler  pending  2D Echocardiogram pending  CXR  No acute disease  EKG  done.   Therapy Recommendations pending  Physical Exam   Frail young african american lady not in distress.Awake alert. Afebrile. Head is nontraumatic. Neck is supple without bruit. Hearing is normal. Cardiac exam no murmur or gallop. Lungs are clear to auscultation. Distal pulses are well felt.dialysis fistula left arm. Neurological Exam ;  Awake  Alert oriented x 3. Normal speech and language.eye movements full without nystagmus.fundi were not visualized. Vision acuity and fields appear normal. Hearing is normal. Palatal movements are normal. Face asymmetric with mild right lower face weak. Tongue midline. Normal strength, tone, reflexes and coordination. Normal sensation. Gait deferred. ASSESSMENT [name] is a 38 y.o. female presenting with right lower face weakness likely from small left brain subcortical infarct not visualized on MRI due to small vessel disease    On no antiplatelets prior to  admission. Now on aspirin for secondary stroke prevention. Patient with resultant mild right lower face weakness.Stroke work up underway  LDL 100mg   HbA1c 10.3  Hypertension     Hospital day # 1  TREATMENT/PLAN  Continue Aspirin  for secondary stroke prevention.   maintain strict control of hypertension with blood  pressure goal below 130/90, diabetes with hemoglobin A1c goal below 6.5% and lipids with LDL cholesterol goal below 100 mg/dL. Followup in the future with Dr Erlinda Hong in  4 weeks at [facility] I have personally examined this patient, reviewed notes, independently viewed imaging studies, participated in medical decision making and plan of care. I have made any additions or clarifications directly to the above note. Agree with note above.    Antony Contras, MD Medical Director [facility] Pager: [phone] 03/30/2014 3:38 PM  Antony Contras  SIGNED  To contact Stroke Continuity provider, please refer to [email or web address] hours, contact General Neurology

## 2014-03-31 DIAGNOSIS — Z992 Dependence on renal dialysis: Secondary | ICD-10-CM

## 2014-03-31 DIAGNOSIS — I6789 Other cerebrovascular disease: Secondary | ICD-10-CM

## 2014-03-31 DIAGNOSIS — I10 Essential (primary) hypertension: Secondary | ICD-10-CM

## 2014-03-31 LAB — CBC
HCT: 37.6 % (ref 36.0–46.0)
Hemoglobin: 12.5 g/dL (ref 12.0–15.0)
MCH: 28.5 pg (ref 26.0–34.0)
MCHC: 33.2 g/dL (ref 30.0–36.0)
MCV: 85.6 fL (ref 78.0–100.0)
PLATELETS: 230 10*3/uL (ref 150–400)
RBC: 4.39 MIL/uL (ref 3.87–5.11)
RDW: 14.4 % (ref 11.5–15.5)
WBC: 5.2 10*3/uL (ref 4.0–10.5)

## 2014-03-31 LAB — BASIC METABOLIC PANEL
Anion gap: 23 — ABNORMAL HIGH (ref 5–15)
BUN: 28 mg/dL — AB (ref 6–23)
CHLORIDE: 86 meq/L — AB (ref 96–112)
CO2: 24 mEq/L (ref 19–32)
Calcium: 9.5 mg/dL (ref 8.4–10.5)
Creatinine, Ser: 7.26 mg/dL — ABNORMAL HIGH (ref 0.50–1.10)
GFR, EST AFRICAN AMERICAN: 8 mL/min — AB (ref >90)
GFR, EST NON AFRICAN AMERICAN: 7 mL/min — AB (ref >90)
Glucose, Bld: 334 mg/dL — ABNORMAL HIGH (ref 70–99)
Potassium: 5.3 mEq/L (ref 3.7–5.3)
SODIUM: 133 meq/L — AB (ref 137–147)

## 2014-03-31 LAB — GLUCOSE, CAPILLARY
GLUCOSE-CAPILLARY: 212 mg/dL — AB (ref 70–99)
Glucose-Capillary: 319 mg/dL — ABNORMAL HIGH (ref 70–99)

## 2014-03-31 MED ORDER — ASPIRIN 325 MG PO TABS: 325.0000 mg | ORAL_TABLET | Freq: Every day | ORAL | Status: DC

## 2014-03-31 MED ORDER — ATORVASTATIN CALCIUM 10 MG PO TABS
20.0000 mg | ORAL_TABLET | Freq: Every day | ORAL | Status: DC
Start: 2014-03-31 — End: 2014-03-31

## 2014-03-31 MED ORDER — AMLODIPINE BESYLATE 10 MG PO TABS
10.0000 mg | ORAL_TABLET | Freq: Every day | ORAL | Status: DC
  Administered 2014-03-31: 10 mg via ORAL
  Filled 2014-03-31: qty 1

## 2014-03-31 MED ORDER — LABETALOL HCL 200 MG PO TABS
600.0000 mg | ORAL_TABLET | Freq: Three times a day (TID) | ORAL | Status: DC
  Administered 2014-03-31: 600 mg via ORAL
  Filled 2014-03-31: qty 3

## 2014-03-31 MED ORDER — INSULIN ASPART 100 UNIT/ML ~~LOC~~ SOLN
0.0000 [IU] | Freq: Three times a day (TID) | SUBCUTANEOUS | Status: DC
  Administered 2014-03-31: 11 [IU] via SUBCUTANEOUS
  Administered 2014-03-31: 5 [IU] via SUBCUTANEOUS

## 2014-03-31 MED ORDER — INSULIN GLARGINE 100 UNIT/ML ~~LOC~~ SOLN
10.0000 [IU] | Freq: Every day | SUBCUTANEOUS | Status: DC
  Filled 2014-03-31: qty 0.1

## 2014-03-31 MED ORDER — CALCIUM ACETATE 667 MG PO CAPS
667.0000 mg | ORAL_CAPSULE | Freq: Three times a day (TID) | ORAL | Status: DC
  Administered 2014-03-31 (×2): 667 mg via ORAL
  Filled 2014-03-31 (×5): qty 1

## 2014-03-31 NOTE — Progress Notes (Signed)
  [facility] Progress Note   Subjective: No complaints  Filed Vitals:   03/30/14 2322 03/31/14 0200 03/31/14 0623 03/31/14 1000  BP: 149/80 152/78 139/82 140/80  Pulse: 97 92 95 92  Temp: 99.3 F (37.4 C) 99.5 F (37.5 C) 99.5 F (37.5 C) 98.6 F (37 C)  TempSrc: Oral Oral Oral Oral  Resp: 18 18 18 20   Height:      Weight:      SpO2: 100% 100% 100% 100%   Exam: Alert, no distress No jvd Chest clear bilat RRR no MRG Abd soft, flat, NTND, + BS No LE edema L arm AVG patent Neuro - weakness of R mouth, eye and forehead muscles compared to L No drift of upper ext weakness Alert and Ox 3  HD: TTS [place] 3.5h   52kg   2/2.0 [place]  425/A1.5   Heparin 2000    L arm AVG Hectorol 3 ug TIW, Aranesp 40 ug q week       Assessment: 1 R facial weakness - CVA vs facial nerve paralysis 2 ESRD on HD 3 HTN/vol cont labet, norvasc, at dry wt 4 Anemia stable 5 HPTH stable 6 DM on insulin  Plan- no new suggestions, HD tomorrow if still here    Kelly Splinter MD  pager [phone]    cell [phone]  03/31/2014, 10:17 AM     Recent Labs Lab 03/29/14 1622 03/29/14 1645 03/29/14 2224 03/30/14 0758 03/31/14 0644  NA 134* 131*  --  136* 133*  K 4.7 5.0  --  4.5 5.3  CL 88* 98  --  90* 86*  CO2 22  --   --  24 24  GLUCOSE 233* 251*  --  196* 334*  BUN 33* 46*  --  42* 28*  CREATININE 8.60* 9.90* 9.51* 10.68* 7.26*  CALCIUM 10.2  --   --  9.5 9.5  PHOS  --   --   --  7.4*  --     Recent Labs Lab 03/29/14 1622 03/30/14 0758  AST 20  --   ALT 12  --   ALKPHOS 124*  --   BILITOT 0.3  --   PROT 8.6*  --   ALBUMIN 4.5 3.9    Recent Labs Lab 03/29/14 1622  03/29/14 2224 03/30/14 0758 03/31/14 0644  WBC 4.9  --  5.9 5.1 5.2  NEUTROABS 2.7  --   --   --   --   HGB 13.2  < > 11.5* 11.9* 12.5  HCT 38.7  < > 33.6* 35.1* 37.6  MCV 84.3  --  83.8 85.2 85.6  PLT 212  --  211 199 230  < > = values in this interval not displayed. Marland Kitchen amLODipine  10 mg  Oral Daily  . aspirin  300 mg Rectal Daily   Or  . aspirin  325 mg Oral Daily  . atorvastatin  20 mg Oral q1800  . calcium acetate  667 mg Oral TID WC  . doxercalciferol  3 mcg Intravenous Q T,Th,Sa-HD  . heparin  5,000 Units Subcutaneous 3 times per day  . insulin aspart  0-15 Units Subcutaneous TID WC  . insulin glargine  10 Units Subcutaneous QHS  . labetalol  600 mg Oral TID     albuterol, hydrALAZINE, senna-docusate

## 2014-03-31 NOTE — Discharge Summary (Signed)
Physician Discharge Summary  [name] [id number] DOB: 26-Jul-1975 DOA: 03/29/2014  PCP: Benito Mccreedy, MD  Admit date: 03/29/2014 Discharge date: 03/31/2014  Time spent: 45 minutes  Recommendations for Outpatient Follow-up:  Patient will be discharged home. She is to follow up with her primary care physician within one week of discharge. Patient should also follow up with Dr. Erlinda Hong, nephrologist, within 2 months of discharge. Patient to continue taking her medications as prescribed. She should discuss her diabetes management with her primary care physician for better control. Patient to continue physical activity as tolerated. She should resume a heart healthy/carb modified diet. Patient to continue dialysis as scheduled.  Discharge Diagnoses:  Right-sided facial droop, likely CVA End-stage renal disease requiring hemodialysis Diabetes mellitus Hypertension  Discharge Condition: Stable  Diet recommendation: Heart healthy/carb modified  Filed Weights   03/29/14 1546 03/30/14 0740 03/30/14 1120  Weight: 53.524 kg (118 lb) 51.5 kg (113 lb 8.6 oz) 51.5 kg (113 lb 8.6 oz)    History of present illness:  on 03/29/2014  [name] is a 38 y.o. female with a history of end-stage renal disease requiring hemodialysis, diabetes mellitus, hypertension, hyperlipidemia that presented to the emergency department with complaints of right-sided facial droop. Patient was standing at the bus stop waiting for her child at approximately 2:56 PM, at which point she was drinking water and felt a drooping of her mouth. Patient home, looked in the near abdominal her smile is asymmetric. Patient denied any weakness or numbness in her extremities. Patient did state that prior to this episode, she did feel a headache in the front of her head which was controlled. At the time of admission, patients headache resolved. Patient also had some dizziness prior to emergency Department arrival which had also  resolved. Patient reported she had missed 2 doses of her blood pressure medications on the day of admission, at breakfast as well as lunch time. When patient arrived home, she noted her blood pressure was 157/112, at that time decided to take 3 labetalol pills for a total dose of 900 mg. She also took her 10 mg amlodipine pill as well. Patient also took 281 mg aspirins. Upon arrival to the emergency department, patient was noted to be a code stroke. She was not given TPA. CT of the head did not show any acute abnormalities. Neurology was consulted by the emergency room physician.  Hospital Course:  Right-sided facial droop, likely CVA  -CT of the head: No acute intracranial abnormality.  -MRI of brain: No acute intracranial abnormality  -Neurology consulted and appreciated and feels that patient may have had a small left brain subcortical infarct not visualized on MRI due to small vessel disease.  Recommended continuing aspirin, better control of her diabetes mellitus, outpatient followup with Dr.Xu.   -Continue aspirin and statin -Echocardiogram: EF 55-60% -Carotid Doppler: Bilateral: 1-39% ICA stenosis. Vertebral artery flow is antegrade -LDL 100, HbA1c 10.3  -Patient did pass her swallow evaluation -PT evaluate patient, she does not require any further therapy  End-stage disease requiring hemodialysis  -Patient dialyze on Tuesday, Thursday, Friday  -Nephrology consulted and appreciated   Diabetes mellitus  -Continue Lantus, placed on insulin sliding scale  -Hemoglobin A1c 10.3 -Patient will need followup with her primary care physician regarding better control of her diabetes mellitus.  Hypertension  -Continue home meds, amlodipine, carvedilol  Procedures: Echocardiogram Study Conclusions - Left ventricle: E/e&'>14.4 suggestive of elevated LV filling pressures. The cavity size was normal. Systolic function was normal. The estimated  ejection fraction was in the range of 55% to 60%.  Wall motion was normal; there were no regional wall motion abnormalities. - Aortic valve: Moderate thickening and calcification, consistent with sclerosis. - Mitral valve: There was trivial regurgitation. - Tricuspid valve: There was trivial regurgitation. - Pulmonic valve: There was trivial regurgitation. - Pericardium, extracardiac: A trivial pericardial effusion was identified posterior to the heart.  Carotid Doppler: Bilateral: 1-39% ICA stenosis. Vertebral artery flow is antegrade  Consultations: Nephrology Neurology  Discharge Exam: Filed Vitals:   03/31/14 1000  BP: 140/80  Pulse: 92  Temp: 98.6 F (37 C)  Resp: 20   Exam  General: Well developed, well nourished, NAD, appears stated age  HEENT: NCAT, mucous membranes moist.  Neck: Supple, no JVD, no masses  Cardiovascular: S1 S2 auscultated, 1/6 SEM. Regular rate and rhythm.  Respiratory: Clear to auscultation bilaterally with equal chest rise  Abdomen: Soft, nontender, nondistended, + bowel sounds  Extremities: warm dry without cyanosis clubbing or edema, LUE AVF +bruit/thrill  Neuro: AAOx3, right sided facial droop-improved  Psych: Normal affect and demeanor with intact judgement and insight  Discharge Instructions      Discharge Instructions   Discharge instructions    Complete by:  As directed   Patient will be discharged home. She is to follow up with her primary care physician within one week of discharge. Patient should also follow up with Dr. Erlinda Hong, nephrologist, within 2 months of discharge. Patient to continue taking her medications as prescribed. She should discuss her diabetes management with her primary care physician for better control. Patient to continue physical activity as tolerated. She should resume a heart healthy/carb modified diet. Patient to continue dialysis as scheduled.            Medication List    STOP taking these medications       aspirin EC 81 MG tablet  Replaced by:  aspirin 325  MG tablet      TAKE these medications       albuterol 108 (90 BASE) MCG/ACT inhaler  Commonly known as:  PROVENTIL HFA;VENTOLIN HFA  Inhale 2 puffs into the lungs every 6 (six) hours as needed for wheezing or shortness of breath.     amLODipine 10 MG tablet  Commonly known as:  NORVASC  Take 10 mg by mouth daily.     aspirin 325 MG tablet  Take 1 tablet (325 mg total) by mouth daily.     FLINSTONES GUMMIES OMEGA-3 DHA Chew  Chew 1 tablet by mouth daily.     GLUCAGON EMERGENCY 1 MG injection  Generic drug:  glucagon  Inject 1 mg into the muscle once as needed (for severe low blood sugar).     glucose 4 GM chewable tablet  Chew 4-16 g by mouth as needed for low blood sugar.     insulin lispro 100 UNIT/ML injection  Commonly known as:  HUMALOG  Inject 3-15 Units into the skin 3 (three) times daily before meals. Per sliding scale     labetalol 300 MG tablet  Commonly known as:  NORMODYNE  Take 600 mg by mouth 3 (three) times daily.     LANTUS 100 UNIT/ML injection  Generic drug:  insulin glargine  Inject 10 Units into the skin at bedtime.     lidocaine-prilocaine cream  Commonly known as:  EMLA  Apply 1 application topically as needed. Prior to dialysis     medroxyPROGESTERone 150 MG/ML injection  Commonly known as:  DEPO-PROVERA  Inject  150 mg into the muscle every 3 (three) months.     PHOSLO 667 MG capsule  Generic drug:  calcium acetate  Take 667 mg by mouth 3 (three) times daily with meals.     rosuvastatin 10 MG tablet  Commonly known as:  CRESTOR  Take 10 mg by mouth daily.       Allergies  Allergen Reactions  . Pollen Extract Other (See Comments)    Watery eyes   Follow-up Information   Follow up with OSEI-BONSU,GEORGE, MD. Schedule an appointment as soon as possible for a visit in 1 week. [facility] followup)    Specialty:  Internal Medicine   Contact information:   [id number] [place] [address] [phone]       Follow up  with Xu,Jindong, MD. Schedule an appointment as soon as possible for a visit in 2 months. [facility], stroke clinic)    Specialty:  Neurology   Contact information:   [address] [place] [address] [phone]        The results of significant diagnostics from this hospitalization (including imaging, microbiology, ancillary and laboratory) are listed below for reference.    Significant Diagnostic Studies: Dg Chest 2 View  03/29/2014   CLINICAL DATA:  Stroke.  EXAM: CHEST  2 VIEW  COMPARISON:  12/10/2012  FINDINGS: The heart size and mediastinal contours are within normal limits. Both lungs are clear. The visualized skeletal structures are unremarkable. Surgical clips in the left axilla.  IMPRESSION: No active cardiopulmonary disease.   Electronically Signed   By: Lucienne Capers M.D.   On: 03/29/2014 21:29   Ct Head Wo Contrast  03/29/2014   CLINICAL DATA:  Acute stroke.  Right facial droop.  EXAM: CT HEAD WITHOUT CONTRAST  TECHNIQUE: Contiguous axial images were obtained from the base of the skull through the vertex without intravenous contrast.  COMPARISON:  01/29/2013  FINDINGS: There is no evidence of intracranial hemorrhage, brain edema, or other signs of acute infarction. There is no evidence of intracranial mass lesion or mass effect. No abnormal extraaxial fluid collections are identified.  Mild generalized cerebral atrophy appears stable. Ventricles are stable in size. No skull abnormality identified.  IMPRESSION: No acute intracranial abnormality.  Stable mild cerebral atrophy.  These results were called by telephone at the time of interpretation on 03/29/2014 at 4:23 pm to Dr. Armida Sans , who verbally acknowledged these results.   Electronically Signed   By: Earle Gell M.D.   On: 03/29/2014 16:23   Mr Jodene Nam Head Wo Contrast  03/29/2014   CLINICAL DATA:  Right-sided facial droop.  Stroke.  EXAM: MRI HEAD WITHOUT CONTRAST  MRA HEAD WITHOUT CONTRAST  TECHNIQUE: Multiplanar,  multiecho pulse sequences of the brain and surrounding structures were obtained without intravenous contrast. Angiographic images of the head were obtained using MRA technique without contrast.  COMPARISON:  CT head from the same day.  FINDINGS: MRI HEAD FINDINGS  Diffusion-weighted images demonstrate no evidence for acute or subacute infarction. No hemorrhage or mass lesion is present. Moderate generalized atrophy is advanced for age. There is no significant focal white matter disease. Flow is present in the major intracranial arteries. The globes and orbits are intact. The paranasal sinuses and mastoid air cells are clear.  MRA HEAD FINDINGS  The internal carotid arteries are within normal limits from the high cervical segments through the ICA termini bilaterally. The A1 and M1 segments are normal. The left A1 segment is dominant. The anterior  communicating artery is patent. The MCA bifurcations are within normal limits bilaterally. The ACA and MCA branch vessels are within normal limits.  The left vertebral artery is the dominant vessel. The right PICA origin is visualized and normal. Prominent AICA vessels are Norma within normal limits bilaterally. The basilar artery is normal. The left posterior cerebral artery is predominantly fed by the A1 segment P1 segment with contribution from the posterior communicating artery. The right posterior cerebral artery is predominantly fed by the posterior communicating artery with contribution from the right P1 segment. The PCA branch vessels are within normal limits bilaterally.  IMPRESSION: 1. No acute intracranial abnormality. 2. Moderate age advanced atrophy. 3. Normal variant MRA circle of Willis without evidence for significant proximal stenosis, aneurysm, or branch vessel occlusion.   Electronically Signed   By: Lawrence Santiago M.D.   On: 03/29/2014 20:13   Mr Brain Wo Contrast  03/29/2014   CLINICAL DATA:  Right-sided facial droop.  Stroke.  EXAM: MRI HEAD WITHOUT  CONTRAST  MRA HEAD WITHOUT CONTRAST  TECHNIQUE: Multiplanar, multiecho pulse sequences of the brain and surrounding structures were obtained without intravenous contrast. Angiographic images of the head were obtained using MRA technique without contrast.  COMPARISON:  CT head from the same day.  FINDINGS: MRI HEAD FINDINGS  Diffusion-weighted images demonstrate no evidence for acute or subacute infarction. No hemorrhage or mass lesion is present. Moderate generalized atrophy is advanced for age. There is no significant focal white matter disease. Flow is present in the major intracranial arteries. The globes and orbits are intact. The paranasal sinuses and mastoid air cells are clear.  MRA HEAD FINDINGS  The internal carotid arteries are within normal limits from the high cervical segments through the ICA termini bilaterally. The A1 and M1 segments are normal. The left A1 segment is dominant. The anterior communicating artery is patent. The MCA bifurcations are within normal limits bilaterally. The ACA and MCA branch vessels are within normal limits.  The left vertebral artery is the dominant vessel. The right PICA origin is visualized and normal. Prominent AICA vessels are Norma within normal limits bilaterally. The basilar artery is normal. The left posterior cerebral artery is predominantly fed by the A1 segment P1 segment with contribution from the posterior communicating artery. The right posterior cerebral artery is predominantly fed by the posterior communicating artery with contribution from the right P1 segment. The PCA branch vessels are within normal limits bilaterally.  IMPRESSION: 1. No acute intracranial abnormality. 2. Moderate age advanced atrophy. 3. Normal variant MRA circle of Willis without evidence for significant proximal stenosis, aneurysm, or branch vessel occlusion.   Electronically Signed   By: Lawrence Santiago M.D.   On: 03/29/2014 20:13    Microbiology: No results found for this or any  previous visit (from the past 240 hour(s)).   Labs: Basic Metabolic Panel:  Recent Labs Lab 03/29/14 1622 03/29/14 1645 03/29/14 2224 03/30/14 0758 03/31/14 0644  NA 134* 131*  --  136* 133*  K 4.7 5.0  --  4.5 5.3  CL 88* 98  --  90* 86*  CO2 22  --   --  24 24  GLUCOSE 233* 251*  --  196* 334*  BUN 33* 46*  --  42* 28*  CREATININE 8.60* 9.90* 9.51* 10.68* 7.26*  CALCIUM 10.2  --   --  9.5 9.5  PHOS  --   --   --  7.4*  --    Liver Function Tests:  Recent  Labs Lab 03/29/14 1622 03/30/14 0758  AST 20  --   ALT 12  --   ALKPHOS 124*  --   BILITOT 0.3  --   PROT 8.6*  --   ALBUMIN 4.5 3.9   No results found for this basename: LIPASE, AMYLASE,  in the last 168 hours No results found for this basename: AMMONIA,  in the last 168 hours CBC:  Recent Labs Lab 03/29/14 1622 03/29/14 1645 03/29/14 2224 03/30/14 0758 03/31/14 0644  WBC 4.9  --  5.9 5.1 5.2  NEUTROABS 2.7  --   --   --   --   HGB 13.2 14.6 11.5* 11.9* 12.5  HCT 38.7 43.0 33.6* 35.1* 37.6  MCV 84.3  --  83.8 85.2 85.6  PLT 212  --  211 199 230   Cardiac Enzymes: No results found for this basename: CKTOTAL, CKMB, CKMBINDEX, TROPONINI,  in the last 168 hours BNP: BNP (last 3 results) No results found for this basename: PROBNP,  in the last 8760 hours CBG:  Recent Labs Lab 03/30/14 1237 03/30/14 1643 03/30/14 2228 03/31/14 0638 03/31/14 1124  GLUCAP 117* 206* 313* 319* 212*       Signed:  Sylvia Kondracki  [facility] 03/31/2014, 12:14 PM

## 2014-03-31 NOTE — Discharge Instructions (Signed)
Diabetes and Exercise Exercising regularly is important. It is not just about losing weight. It has many health benefits, such as:  Improving your overall fitness, flexibility, and endurance.  Increasing your bone density.  Helping with weight control.  Decreasing your body fat.  Increasing your muscle strength.  Reducing stress and tension.  Improving your overall health. People with diabetes who exercise gain additional benefits because exercise:  Reduces appetite.  Improves the body's use of blood sugar (glucose).  Helps lower or control blood glucose.  Decreases blood pressure.  Helps control blood lipids (such as cholesterol and triglycerides).  Improves the body's use of the hormone insulin by:  Increasing the body's insulin sensitivity.  Reducing the body's insulin needs.  Decreases the risk for heart disease because exercising:  Lowers cholesterol and triglycerides levels.  Increases the levels of good cholesterol (such as high-density lipoproteins [HDL]) in the body.  Lowers blood glucose levels. YOUR ACTIVITY PLAN  Choose an activity that you enjoy and set realistic goals. Your health care provider or diabetes educator can help you make an activity plan that works for you. Exercise regularly as directed by your health care provider. This includes:  Performing resistance training twice a week such as push-ups, sit-ups, lifting weights, or using resistance bands.  Performing 150 minutes of cardio exercises each week such as walking, running, or playing sports.  Staying active and spending no more than 90 minutes at one time being inactive. Even short bursts of exercise are good for you. Three 10-minute sessions spread throughout the day are just as beneficial as a single 30-minute session. Some exercise ideas include:  Taking the dog for a walk.  Taking the stairs instead of the elevator.  Dancing to your favorite song.  Doing an exercise  video.  Doing your favorite exercise with a friend. RECOMMENDATIONS FOR EXERCISING WITH TYPE 1 OR TYPE 2 DIABETES   Check your blood glucose before exercising. If blood glucose levels are greater than 240 mg/dL, check for urine ketones. Do not exercise if ketones are present.  Avoid injecting insulin into areas of the body that are going to be exercised. For example, avoid injecting insulin into:  The arms when playing tennis.  The legs when jogging.  Keep a record of:  Food intake before and after you exercise.  Expected peak times of insulin action.  Blood glucose levels before and after you exercise.  The type and amount of exercise you have done.  Review your records with your health care provider. Your health care provider will help you to develop guidelines for adjusting food intake and insulin amounts before and after exercising.  If you take insulin or oral hypoglycemic agents, watch for signs and symptoms of hypoglycemia. They include:  Dizziness.  Shaking.  Sweating.  Chills.  Confusion.  Drink plenty of water while you exercise to prevent dehydration or heat stroke. Body water is lost during exercise and must be replaced.  Talk to your health care provider before starting an exercise program to make sure it is safe for you. Remember, almost any type of activity is better than none. Document Released: 09/27/2003 Document Revised: 11/21/2013 Document Reviewed: 12/14/2012 ExitCare Patient Information 2015 [employer]. This information is not intended to replace advice given to you by your health care provider. Make sure you discuss any questions you have with your health care provider.  

## 2014-03-31 NOTE — Progress Notes (Signed)
VASCULAR LAB PRELIMINARY  PRELIMINARY  PRELIMINARY  PRELIMINARY  Carotid duplex  completed.    Preliminary report:  Bilateral:  1-39% ICA stenosis.  Vertebral artery flow is antegrade.      [name], RVT 03/31/2014, 11:38 AM

## 2014-03-31 NOTE — Progress Notes (Signed)
Patient is discharged from room 4N04 at this time. Alert and in stable condition. IV site d/c'd as well as tele. Instructions read to patient and understanding verbalized. Ambulate out of unit with belongings at side.

## 2014-03-31 NOTE — Progress Notes (Signed)
Inpatient Diabetes Program Recommendations  AACE/ADA: New Consensus Statement on Inpatient Glycemic Control (2013)  Target Ranges:  Prepandial:   less than 140 mg/dL      Peak postprandial:   less than 180 mg/dL (1-2 hours)      Critically ill patients:  140 - 180 mg/dL   Reason for Visit: Results for DELAILA, GIARRAPUTO (MRN [id number]) as of 03/31/2014 10:13  Ref. Range 03/30/2014 12:37 03/30/2014 16:43 03/30/2014 22:28 03/31/2014 06:38  Glucose-Capillary Latest Range: 70-99 mg/dL 117 (H) 206 (H) 313 (H) 319 (H)   Diabetes history: Diabetes Outpatient Diabetes medications: Lantus 10 units daily, Novolog Current orders for Inpatient glycemic control:  Called and discussed with Dr. Barbaraann Rondo.  Lantus insulin was restarted by her earlier this AM.    No further recs.  Thanks, Adah Perl, RN, BC-ADM Inpatient Diabetes Coordinator Pager [phone]

## 2014-03-31 NOTE — Care Management Note (Signed)
    Page 1 of 1   03/31/2014     1:59:54 PM CARE MANAGEMENT NOTE 03/31/2014  Patient:  [name], [name]   Account Number:  [id number]  Date Initiated:  03/30/2014  Documentation initiated by:  Lorne Skeens  Subjective/Objective Assessment:   patient was admitted for CVA work-up. Lives at home with children     Action/Plan:   Will follow for discharge needs pending PT/OT evals and physician orders.   Anticipated DC Date:     Anticipated DC Plan:  HOME/SELF CARE         Choice offered to / List presented to:             Status of service:  In process, will continue to follow Medicare Important Message given?  NA - LOS <3 / Initial given by admissions (If response is "NO", the following Medicare IM given date fields will be blank) Date Medicare IM given:   Medicare IM given by:   Date Additional Medicare IM given:   Additional Medicare IM given by:    Discharge Disposition:  HOME/SELF CARE  Per UR Regulation:  Reviewed for med. necessity/level of care/duration of stay  If discussed at [place] of Stay Meetings, dates discussed:    Comments:

## 2014-03-31 NOTE — Progress Notes (Signed)
  Echocardiogram 2D Echocardiogram has been performed.  Hameed Kolar 03/31/2014, 10:00 AM

## 2014-05-22 ENCOUNTER — Ambulatory Visit (INDEPENDENT_AMBULATORY_CARE_PROVIDER_SITE_OTHER): Payer: Medicare Other | Admitting: Neurology

## 2014-05-22 ENCOUNTER — Encounter: Payer: Self-pay | Admitting: Neurology

## 2014-05-22 VITALS — BP 132/75 | HR 87 | Ht 64.25 in | Wt 120.4 lb

## 2014-05-22 DIAGNOSIS — I639 Cerebral infarction, unspecified: Secondary | ICD-10-CM

## 2014-05-22 DIAGNOSIS — E119 Type 2 diabetes mellitus without complications: Secondary | ICD-10-CM | POA: Insufficient documentation

## 2014-05-22 DIAGNOSIS — E1159 Type 2 diabetes mellitus with other circulatory complications: Secondary | ICD-10-CM

## 2014-05-22 DIAGNOSIS — I161 Hypertensive emergency: Secondary | ICD-10-CM

## 2014-05-22 DIAGNOSIS — I10 Essential (primary) hypertension: Secondary | ICD-10-CM

## 2014-05-22 DIAGNOSIS — Z992 Dependence on renal dialysis: Secondary | ICD-10-CM

## 2014-05-22 DIAGNOSIS — N186 End stage renal disease: Secondary | ICD-10-CM

## 2014-05-22 DIAGNOSIS — E785 Hyperlipidemia, unspecified: Secondary | ICD-10-CM

## 2014-05-22 HISTORY — DX: Cerebral infarction, unspecified: I63.9

## 2014-05-22 HISTORY — DX: Hypertensive emergency: I16.1

## 2014-05-22 NOTE — Progress Notes (Signed)
STROKE NEUROLOGY FOLLOW UP NOTE  NAME: [name] DOB: 10/10/1975  REASON FOR VISIT: stroke follow up HISTORY FROM: chart and pt  Today we had the pleasure of seeing [name] in follow-up at our [facility]. Pt was accompanied by no one.   History Summary 38 year old AAF with multiple medical problems including uncontrolled HTN, DM, HLD and ESRD on HD was admitted on 03/29/14 for acute onset dizziness, vertigo, and right facial droop with drooling. She was found to have high BP in ER around 200s/100s. Her vertigo lasted about 10-12min but right facial droop lasted about 4-5 days. She declined PRISMS trial in ER. MRI did not show acute stroke. Her A1C was 10.3 and LDL 100. She was discharged with ASA 325mg  and crestor.   Interval History During the interval time, the patient has been doing well. No recurrent stroke symptoms. Her right facial droop resolved and denies any weakness, nubmness, HA or dizziness. Her sugar level is not in good control still, low as 49 today at home and high as 200s. She is on lantus and novolog at home. She usually not eating breakfast but eat more in lunch and dinner. Her BP was 132/75 today in clinic. She is still using Depo-provera for contraception and about to get another injection in the middle of this month.  REVIEW OF SYSTEMS: Full 14 system review of systems performed and notable only for those listed below and in HPI above, all others are negative:  Constitutional: fatigue  Cardiovascular: N/A  Ear/Nose/Throat: N/A  Skin: N/A  Eyes: N/A  Respiratory: cough and snoring  Gastroitestinal: N/A  Genitourinary: N/A Hematology/Lymphatic: anemia  Endocrine: feeling cold  Musculoskeletal: N/A  Allergy/Immunology: runny nose  Neurological: HA  Psychiatric: decreased energy  The following represents the patient's updated allergies and side effects list: Allergies  Allergen Reactions  . Pollen Extract Other (See Comments)    Watery  eyes    Labs since last visit of relevance include the following: Results for orders placed or performed during the hospital encounter of 03/29/14  Ethanol  Result Value Ref Range   Alcohol, Ethyl (B) <11 0 - 11 mg/dL  Protime-INR  Result Value Ref Range   Prothrombin Time 13.6 11.6 - 15.2 seconds   INR 1.04 0.00 - 1.49  APTT  Result Value Ref Range   aPTT 33 24 - 37 seconds  CBC  Result Value Ref Range   WBC 4.9 4.0 - 10.5 K/uL   RBC 4.59 3.87 - 5.11 MIL/uL   Hemoglobin 13.2 12.0 - 15.0 g/dL   HCT 38.7 36.0 - 46.0 %   MCV 84.3 78.0 - 100.0 fL   MCH 28.8 26.0 - 34.0 pg   MCHC 34.1 30.0 - 36.0 g/dL   RDW 14.2 11.5 - 15.5 %   Platelets 212 150 - 400 K/uL  Differential  Result Value Ref Range   Neutrophils Relative % 54 43 - 77 %   Neutro Abs 2.7 1.7 - 7.7 K/uL   Lymphocytes Relative 36 12 - 46 %   Lymphs Abs 1.8 0.7 - 4.0 K/uL   Monocytes Relative 5 3 - 12 %   Monocytes Absolute 0.2 0.1 - 1.0 K/uL   Eosinophils Relative 4 0 - 5 %   Eosinophils Absolute 0.2 0.0 - 0.7 K/uL   Basophils Relative 1 0 - 1 %   Basophils Absolute 0.0 0.0 - 0.1 K/uL  Comprehensive metabolic panel  Result Value Ref Range   Sodium  134 (L) 137 - 147 mEq/L   Potassium 4.7 3.7 - 5.3 mEq/L   Chloride 88 (L) 96 - 112 mEq/L   CO2 22 19 - 32 mEq/L   Glucose, Bld 233 (H) 70 - 99 mg/dL   BUN 33 (H) 6 - 23 mg/dL   Creatinine, Ser 8.60 (H) 0.50 - 1.10 mg/dL   Calcium 10.2 8.4 - 10.5 mg/dL   Total Protein 8.6 (H) 6.0 - 8.3 g/dL   Albumin 4.5 3.5 - 5.2 g/dL   AST 20 0 - 37 U/L   ALT 12 0 - 35 U/L   Alkaline Phosphatase 124 (H) 39 - 117 U/L   Total Bilirubin 0.3 0.3 - 1.2 mg/dL   GFR calc non Af Amer 5 (L) >90 mL/min   GFR calc Af Amer 6 (L) >90 mL/min   Anion gap 24 (H) 5 - 15  Urine Drug Screen  Result Value Ref Range   Opiates NONE DETECTED NONE DETECTED   Cocaine NONE DETECTED NONE DETECTED   Benzodiazepines NONE DETECTED NONE DETECTED   Amphetamines NONE DETECTED NONE DETECTED    Tetrahydrocannabinol NONE DETECTED NONE DETECTED   Barbiturates NONE DETECTED NONE DETECTED  Urinalysis, Routine w reflex microscopic  Result Value Ref Range   Color, Urine YELLOW YELLOW   APPearance CLEAR CLEAR   Specific Gravity, Urine 1.015 1.005 - 1.030   pH 8.0 5.0 - 8.0   Glucose, UA >1000 (A) NEGATIVE mg/dL   Hgb urine dipstick SMALL (A) NEGATIVE   Bilirubin Urine NEGATIVE NEGATIVE   Ketones, ur NEGATIVE NEGATIVE mg/dL   Protein, ur 100 (A) NEGATIVE mg/dL   Urobilinogen, UA 0.2 0.0 - 1.0 mg/dL   Nitrite NEGATIVE NEGATIVE   Leukocytes, UA TRACE (A) NEGATIVE  Hemoglobin A1c  Result Value Ref Range   Hgb A1c MFr Bld 10.6 (H) <5.7 %   Mean Plasma Glucose 258 (H) <117 mg/dL  Lipid panel  Result Value Ref Range   Cholesterol 187 0 - 200 mg/dL   Triglycerides 173 (H) <150 mg/dL   HDL 64 >39 mg/dL   Total CHOL/HDL Ratio 2.9 RATIO   VLDL 35 0 - 40 mg/dL   LDL Cholesterol 88 0 - 99 mg/dL  Urine microscopic-add on  Result Value Ref Range   Squamous Epithelial / LPF RARE RARE   WBC, UA 0-2 <3 WBC/hpf   RBC / HPF 0-2 <3 RBC/hpf   Bacteria, UA RARE RARE   Urine-Other MICROSCOPIC EXAM PERFORMED ON UNCONCENTRATED URINE   Hemoglobin A1c  Result Value Ref Range   Hgb A1c MFr Bld 10.3 (H) <5.7 %   Mean Plasma Glucose 249 (H) <117 mg/dL  Lipid panel  Result Value Ref Range   Cholesterol 202 (H) 0 - 200 mg/dL   Triglycerides 176 (H) <150 mg/dL   HDL 67 >39 mg/dL   Total CHOL/HDL Ratio 3.0 RATIO   VLDL 35 0 - 40 mg/dL   LDL Cholesterol 100 (H) 0 - 99 mg/dL  CBC  Result Value Ref Range   WBC 5.9 4.0 - 10.5 K/uL   RBC 4.01 3.87 - 5.11 MIL/uL   Hemoglobin 11.5 (L) 12.0 - 15.0 g/dL   HCT 33.6 (L) 36.0 - 46.0 %   MCV 83.8 78.0 - 100.0 fL   MCH 28.7 26.0 - 34.0 pg   MCHC 34.2 30.0 - 36.0 g/dL   RDW 14.2 11.5 - 15.5 %   Platelets 211 150 - 400 K/uL  Creatinine, serum  Result Value Ref Range  Creatinine, Ser 9.51 (H) 0.50 - 1.10 mg/dL   GFR calc non Af Amer 5 (L) >90 mL/min    GFR calc Af Amer 5 (L) >90 mL/min  Glucose, capillary  Result Value Ref Range   Glucose-Capillary 219 (H) 70 - 99 mg/dL  Glucose, capillary  Result Value Ref Range   Glucose-Capillary 177 (H) 70 - 99 mg/dL  Glucose, capillary  Result Value Ref Range   Glucose-Capillary 168 (H) 70 - 99 mg/dL  CBC  Result Value Ref Range   WBC 5.1 4.0 - 10.5 K/uL   RBC 4.12 3.87 - 5.11 MIL/uL   Hemoglobin 11.9 (L) 12.0 - 15.0 g/dL   HCT 35.1 (L) 36.0 - 46.0 %   MCV 85.2 78.0 - 100.0 fL   MCH 28.9 26.0 - 34.0 pg   MCHC 33.9 30.0 - 36.0 g/dL   RDW 14.2 11.5 - 15.5 %   Platelets 199 150 - 400 K/uL  Renal function panel  Result Value Ref Range   Sodium 136 (L) 137 - 147 mEq/L   Potassium 4.5 3.7 - 5.3 mEq/L   Chloride 90 (L) 96 - 112 mEq/L   CO2 24 19 - 32 mEq/L   Glucose, Bld 196 (H) 70 - 99 mg/dL   BUN 42 (H) 6 - 23 mg/dL   Creatinine, Ser 10.68 (H) 0.50 - 1.10 mg/dL   Calcium 9.5 8.4 - 10.5 mg/dL   Phosphorus 7.4 (H) 2.3 - 4.6 mg/dL   Albumin 3.9 3.5 - 5.2 g/dL   GFR calc non Af Amer 4 (L) >90 mL/min   GFR calc Af Amer 5 (L) >90 mL/min   Anion gap 22 (H) 5 - 15  Glucose, capillary  Result Value Ref Range   Glucose-Capillary 213 (H) 70 - 99 mg/dL  Glucose, capillary  Result Value Ref Range   Glucose-Capillary 117 (H) 70 - 99 mg/dL  Glucose, capillary  Result Value Ref Range   Glucose-Capillary 206 (H) 70 - 99 mg/dL  CBC  Result Value Ref Range   WBC 5.2 4.0 - 10.5 K/uL   RBC 4.39 3.87 - 5.11 MIL/uL   Hemoglobin 12.5 12.0 - 15.0 g/dL   HCT 37.6 36.0 - 46.0 %   MCV 85.6 78.0 - 100.0 fL   MCH 28.5 26.0 - 34.0 pg   MCHC 33.2 30.0 - 36.0 g/dL   RDW 14.4 11.5 - 15.5 %   Platelets 230 150 - 400 K/uL  Basic metabolic panel  Result Value Ref Range   Sodium 133 (L) 137 - 147 mEq/L   Potassium 5.3 3.7 - 5.3 mEq/L   Chloride 86 (L) 96 - 112 mEq/L   CO2 24 19 - 32 mEq/L   Glucose, Bld 334 (H) 70 - 99 mg/dL   BUN 28 (H) 6 - 23 mg/dL   Creatinine, Ser 7.26 (H) 0.50 - 1.10 mg/dL   Calcium  9.5 8.4 - 10.5 mg/dL   GFR calc non Af Amer 7 (L) >90 mL/min   GFR calc Af Amer 8 (L) >90 mL/min   Anion gap 23 (H) 5 - 15  Glucose, capillary  Result Value Ref Range   Glucose-Capillary 313 (H) 70 - 99 mg/dL  Glucose, capillary  Result Value Ref Range   Glucose-Capillary 319 (H) 70 - 99 mg/dL   Comment 1 Documented in Chart    Comment 2 Notify RN   Glucose, capillary  Result Value Ref Range   Glucose-Capillary 212 (H) 70 - 99 mg/dL   Comment 1  Notify RN   I-Stat Chem 8, ED  Result Value Ref Range   Sodium 131 (L) 137 - 147 mEq/L   Potassium 5.0 3.7 - 5.3 mEq/L   Chloride 98 96 - 112 mEq/L   BUN 46 (H) 6 - 23 mg/dL   Creatinine, Ser 9.90 (H) 0.50 - 1.10 mg/dL   Glucose, Bld 251 (H) 70 - 99 mg/dL   Calcium, Ion 0.96 (L) 1.12 - 1.23 mmol/L   TCO2 31 0 - 100 mmol/L   Hemoglobin 14.6 12.0 - 15.0 g/dL   HCT 43.0 36.0 - 46.0 %  I-Stat Troponin, ED (not at [facility])  Result Value Ref Range   Troponin i, poc 0.04 0.00 - 0.08 ng/mL   Comment 3            The neurologically relevant items on the patient's problem list were reviewed on today's visit.  Neurologic Examination  A problem focused neurological exam (12 or more points of the single system neurologic examination, vital signs counts as 1 point, cranial nerves count for 8 points) was performed.  Blood pressure 132/75, pulse 87, height 5' 4.25" (1.632 m), weight 120 lb 6.4 oz (54.613 kg).  General - Well nourished, well developed, in no apparent distress.  Ophthalmologic - Sharp disc margins OU.  Cardiovascular - Regular rate and rhythm with no murmur.  Mental Status -  Level of arousal and orientation to time, place, and person were intact. Language including expression, naming, repetition, comprehension, reading, and writing was assessed and found intact.  Cranial Nerves II - XII - II - Visual field intact OU. III, IV, VI - Extraocular movements intact. V - Facial sensation intact bilaterally. VII - Facial movement  intact bilaterally. VIII - Hearing & vestibular intact bilaterally. X - Palate elevates symmetrically. XI - Chin turning & shoulder shrug intact bilaterally. XII - Tongue protrusion intact.  Motor Strength - The patient's strength was normal in all extremities and pronator drift was absent.  Bulk was normal and fasciculations were absent.   Motor Tone - Muscle tone was assessed at the neck and appendages and was normal.  Reflexes - The patient's reflexes were normal in all extremities and she had no pathological reflexes.  Sensory - Light touch, temperature/pinprick, vibration and proprioception, and Romberg testing were assessed and were normal.    Coordination - The patient had normal movements in the hands and feet with no ataxia or dysmetria.  Tremor was absent.  Gait and Station - The patient's transfers, posture, gait, station, and turns were observed as normal.  Data reviewed: I personally reviewed the images and agree with the radiology interpretations.  MRI and MRA of the brain  1. No acute intracranial abnormality. 2. Moderate age advanced atrophy. 3. Normal variant MRA circle of Willis without evidence for significant proximal stenosis, aneurysm, or branch vessel occlusion.  Carotid Doppler Bilateral: 1-39% ICA stenosis. Vertebral artery flow is antegrade.  2D Echocardiogram  - Left ventricle: E/e&'>14.4 suggestive of elevated LV filling pressures. The cavity size was normal. Systolic function was normal. The estimated ejection fraction was in the range of 55% to 60%. Wall motion was normal; there were no regional wall motion abnormalities. - Aortic valve: Moderate thickening and calcification, consistent with sclerosis. - Mitral valve: There was trivial regurgitation. - Tricuspid valve: There was trivial regurgitation. - Pulmonic valve: There was trivial regurgitation. - Pericardium, extracardiac: A trivial pericardial effusion was identified  posterior to the heart.  CXR No acute disease  A1C 10.3 and  LDL 100  Assessment: As you may recall, she is a 38 y.o. African American female with PMH of uncontrolled HTN and DM, HLD, ESRD on HD was admitted in 03/2014 for acute onset vertigo and right facial droop and drooling. MRI negative for stroke, MRA unremarkable. CUS and echo were WNL. However, she was found to have A1C 10.3 and LDL 100 as well as high BP. Her symptoms were considered as small vessel event not visualized in MRI due to multiple stroke risk factors. Currently, her BP was good in clinic but her sugar still fluctuate and not in good control. She is on ASA 325 and crestor. She is also using depo-provera for contraception and is due for another injection this month.  Plan:  - continue ASA and crestor for stroke prevention - continue effort to control stroke risk factors, including HTN, DM and HLD - Follow up with your primary care physician for stroke risk factor modification. Recommend maintain blood pressure goal <130/80, diabetes with hemoglobin A1c goal below 6.5% and lipids with LDL cholesterol goal below 70 mg/dL.  - need again DM education - check BP and glucose at home and record and bring over to PCP - recommend to stop use of Depo-provera. Although it is relatively low profile for cardiovascular risk with progesterone only but due to her multiple stroke risk factors, this medication pose higher risk than benefit. We recommend non-hormonal means of contraception. - RTC in 3 months.   No orders of the defined types were placed in this encounter.    Meds ordered this encounter  Medications  . HUMALOG KWIKPEN 100 UNIT/ML KiwkPen    Sig:     Refill:  6  . DISCONTD: LANTUS SOLOSTAR 100 UNIT/ML Solostar Pen    Sig:     Refill:  6  . SENSIPAR 30 MG tablet    Sig: Take 30 mg by mouth daily with breakfast.     Refill:  6    Patient Instructions  - continue ASA and crestor for stroke prevention - continue to  control stroke risk factors, such as BP, DM and HLD - Follow up with your primary care physician for stroke risk factor modification. Recommend maintain blood pressure goal <130/80, diabetes with hemoglobin A1c goal below 6.5% and lipids with LDL cholesterol goal below 70 mg/dL.  - check with BP and glucose at home - avoid hypotension especially during HD and also avoid low glucose - may need DM education again for better diet control - recommend to stop use Depo-provera for birth control as it carries more risk than benefit for the stroke risk in your case. I will follow the message to your PCP and gynecologist too. - follow up in 3 months.   Rosalin Hawking, MD PhD [facility] [address], [place], [place] [address] [phone]

## 2014-05-22 NOTE — Patient Instructions (Addendum)
-   continue ASA and crestor for stroke prevention - continue to control stroke risk factors, such as BP, DM and HLD - Follow up with your primary care physician for stroke risk factor modification. Recommend maintain blood pressure goal <130/80, diabetes with hemoglobin A1c goal below 6.5% and lipids with LDL cholesterol goal below 70 mg/dL.  - check with BP and glucose at home - avoid hypotension especially during HD and also avoid low glucose - may need DM education again for better diet control - recommend to stop use Depo-provera for birth control as it carries more risk than benefit for the stroke risk in your case. I will follow the message to your PCP and gynecologist too. - follow up in 3 months.

## 2014-06-29 ENCOUNTER — Encounter (HOSPITAL_COMMUNITY): Payer: Self-pay | Admitting: Surgery

## 2014-08-28 ENCOUNTER — Ambulatory Visit: Payer: Medicare Other | Admitting: Neurology

## 2014-09-11 ENCOUNTER — Inpatient Hospital Stay (HOSPITAL_BASED_OUTPATIENT_CLINIC_OR_DEPARTMENT_OTHER)
Admission: EM | Admit: 2014-09-11 | Discharge: 2014-09-14 | DRG: 193 | Disposition: A | Discharge status: Home / Self Care | Payer: Medicare Other | Attending: Internal Medicine | Admitting: Internal Medicine

## 2014-09-11 ENCOUNTER — Encounter (HOSPITAL_BASED_OUTPATIENT_CLINIC_OR_DEPARTMENT_OTHER): Payer: Self-pay

## 2014-09-11 ENCOUNTER — Emergency Department (HOSPITAL_BASED_OUTPATIENT_CLINIC_OR_DEPARTMENT_OTHER): Payer: Medicare Other

## 2014-09-11 DIAGNOSIS — D631 Anemia in chronic kidney disease: Secondary | ICD-10-CM | POA: Diagnosis present

## 2014-09-11 DIAGNOSIS — E1021 Type 1 diabetes mellitus with diabetic nephropathy: Secondary | ICD-10-CM | POA: Diagnosis present

## 2014-09-11 DIAGNOSIS — N289 Disorder of kidney and ureter, unspecified: Secondary | ICD-10-CM | POA: Diagnosis present

## 2014-09-11 DIAGNOSIS — N2581 Secondary hyperparathyroidism of renal origin: Secondary | ICD-10-CM | POA: Diagnosis present

## 2014-09-11 DIAGNOSIS — Z992 Dependence on renal dialysis: Secondary | ICD-10-CM | POA: Diagnosis not present

## 2014-09-11 DIAGNOSIS — N186 End stage renal disease: Secondary | ICD-10-CM

## 2014-09-11 DIAGNOSIS — E10319 Type 1 diabetes mellitus with unspecified diabetic retinopathy without macular edema: Secondary | ICD-10-CM | POA: Diagnosis present

## 2014-09-11 DIAGNOSIS — J189 Pneumonia, unspecified organism: Secondary | ICD-10-CM | POA: Diagnosis present

## 2014-09-11 DIAGNOSIS — Z8639 Personal history of other endocrine, nutritional and metabolic disease: Secondary | ICD-10-CM

## 2014-09-11 DIAGNOSIS — E109 Type 1 diabetes mellitus without complications: Secondary | ICD-10-CM | POA: Diagnosis present

## 2014-09-11 DIAGNOSIS — I10 Essential (primary) hypertension: Secondary | ICD-10-CM | POA: Insufficient documentation

## 2014-09-11 DIAGNOSIS — I120 Hypertensive chronic kidney disease with stage 5 chronic kidney disease or end stage renal disease: Secondary | ICD-10-CM | POA: Diagnosis present

## 2014-09-11 DIAGNOSIS — I739 Peripheral vascular disease, unspecified: Secondary | ICD-10-CM | POA: Diagnosis present

## 2014-09-11 DIAGNOSIS — Z7982 Long term (current) use of aspirin: Secondary | ICD-10-CM

## 2014-09-11 DIAGNOSIS — Y95 Nosocomial condition: Secondary | ICD-10-CM | POA: Diagnosis present

## 2014-09-11 DIAGNOSIS — Z794 Long term (current) use of insulin: Secondary | ICD-10-CM

## 2014-09-11 HISTORY — DX: Peripheral vascular disease, unspecified: I73.9

## 2014-09-11 HISTORY — DX: Type 1 diabetes mellitus without complications: E10.9

## 2014-09-11 HISTORY — DX: Type 2 diabetes mellitus with unspecified diabetic retinopathy without macular edema: E11.319

## 2014-09-11 HISTORY — DX: Pneumonia, unspecified organism: J18.9

## 2014-09-11 LAB — CBC WITH DIFFERENTIAL/PLATELET
BASOS ABS: 0.1 10*3/uL (ref 0.0–0.1)
Basophils Relative: 1 % (ref 0–1)
EOS PCT: 1 % (ref 0–5)
Eosinophils Absolute: 0.1 10*3/uL (ref 0.0–0.7)
HEMATOCRIT: 30.4 % — AB (ref 36.0–46.0)
Hemoglobin: 10.6 g/dL — ABNORMAL LOW (ref 12.0–15.0)
LYMPHS PCT: 14 % (ref 12–46)
Lymphs Abs: 1.5 10*3/uL (ref 0.7–4.0)
MCH: 29 pg (ref 26.0–34.0)
MCHC: 34.9 g/dL (ref 30.0–36.0)
MCV: 83.1 fL (ref 78.0–100.0)
MONO ABS: 0.8 10*3/uL (ref 0.1–1.0)
Monocytes Relative: 7 % (ref 3–12)
NEUTROS ABS: 8 10*3/uL — AB (ref 1.7–7.7)
Neutrophils Relative %: 77 % (ref 43–77)
PLATELETS: 265 10*3/uL (ref 150–400)
RBC: 3.66 MIL/uL — ABNORMAL LOW (ref 3.87–5.11)
RDW: 13.5 % (ref 11.5–15.5)
WBC: 10.5 10*3/uL (ref 4.0–10.5)

## 2014-09-11 LAB — BASIC METABOLIC PANEL
Anion gap: 11 (ref 5–15)
BUN: 47 mg/dL — AB (ref 6–23)
CO2: 25 mmol/L (ref 19–32)
Calcium: 8.6 mg/dL (ref 8.4–10.5)
Chloride: 95 mmol/L — ABNORMAL LOW (ref 96–112)
Creatinine, Ser: 10.19 mg/dL — ABNORMAL HIGH (ref 0.50–1.10)
GFR calc Af Amer: 5 mL/min — ABNORMAL LOW (ref >90)
GFR calc non Af Amer: 4 mL/min — ABNORMAL LOW (ref >90)
GLUCOSE: 66 mg/dL — AB (ref 70–99)
Potassium: 3.6 mmol/L (ref 3.5–5.1)
Sodium: 131 mmol/L — ABNORMAL LOW (ref 135–145)

## 2014-09-11 LAB — CBG MONITORING, ED
GLUCOSE-CAPILLARY: 29 mg/dL — AB (ref 70–99)
GLUCOSE-CAPILLARY: 86 mg/dL (ref 70–99)
Glucose-Capillary: 170 mg/dL — ABNORMAL HIGH (ref 70–99)
Glucose-Capillary: 34 mg/dL — CL (ref 70–99)
Glucose-Capillary: 74 mg/dL (ref 70–99)

## 2014-09-11 MED ORDER — VANCOMYCIN HCL 500 MG IV SOLR: 500.0000 mg | INTRAVENOUS | Status: DC

## 2014-09-11 MED ORDER — SODIUM CHLORIDE 0.9 % IV BOLUS (SEPSIS): 250.0000 mL | Freq: Once | INTRAVENOUS | Status: DC

## 2014-09-11 MED ORDER — ONDANSETRON HCL 4 MG/2ML IJ SOLN: 4.0000 mg | Freq: Three times a day (TID) | INTRAMUSCULAR | Status: DC | PRN

## 2014-09-11 MED ORDER — DEXTROSE 50 % IV SOLN
50.0000 mL | Freq: Once | INTRAVENOUS | Status: AC
  Administered 2014-09-11: 50 mL via INTRAVENOUS

## 2014-09-11 MED ORDER — VANCOMYCIN HCL 500 MG IV SOLR
INTRAVENOUS | Status: AC
  Administered 2014-09-11: 500 mg
  Filled 2014-09-11: qty 500

## 2014-09-11 MED ORDER — CEFEPIME HCL 1 G IJ SOLR
INTRAMUSCULAR | Status: AC
  Filled 2014-09-11: qty 1

## 2014-09-11 MED ORDER — ONDANSETRON HCL 4 MG/2ML IJ SOLN
4.0000 mg | Freq: Once | INTRAMUSCULAR | Status: AC
  Administered 2014-09-11: 4 mg via INTRAVENOUS
  Filled 2014-09-11: qty 2

## 2014-09-11 MED ORDER — SODIUM CHLORIDE 0.9 % IV BOLUS (SEPSIS)
250.0000 mL | Freq: Once | INTRAVENOUS | Status: AC
  Administered 2014-09-11: 250 mL via INTRAVENOUS

## 2014-09-11 MED ORDER — DEXTROSE 50 % IV SOLN
INTRAVENOUS | Status: AC
  Administered 2014-09-11: 50 mL via INTRAVENOUS
  Filled 2014-09-11: qty 50

## 2014-09-11 MED ORDER — VANCOMYCIN HCL 500 MG IV SOLR: 500.0000 mg | Freq: Three times a day (TID) | INTRAVENOUS | Status: DC

## 2014-09-11 MED ORDER — VANCOMYCIN HCL IN DEXTROSE 1-5 GM/200ML-% IV SOLN
1000.0000 mg | Freq: Once | INTRAVENOUS | Status: DC
  Filled 2014-09-11: qty 200

## 2014-09-11 MED ORDER — CEFEPIME HCL 1 G IJ SOLR
1.0000 g | Freq: Once | INTRAMUSCULAR | Status: AC
  Administered 2014-09-11: 1 g via INTRAVENOUS

## 2014-09-11 MED ORDER — ACETAMINOPHEN 325 MG PO TABS
650.0000 mg | ORAL_TABLET | Freq: Once | ORAL | Status: AC
  Administered 2014-09-11: 650 mg via ORAL
  Filled 2014-09-11: qty 2

## 2014-09-11 NOTE — ED Provider Notes (Signed)
CSN: [id number]     Arrival date & time 09/11/14  1758 History  This chart was scribed for Malvin Johns, MD by Dellis Filbert, ED Scribe. The patient was seen in [place] and the patient's care was started at 7:12 PM.  Chief Complaint  Patient presents with  . Cough   HPI  HPI Comments: [name] is a 39 y.o. female with hx of ESRD, HTN, and DM who presents to the Emergency Department complaining of an ongoing productive cough since Feb 12th. Pt has had rhinorrhea and sinus congestion since the 12th as well. Pt has also developed chills, fever, vomiting and diarrhea today. She was seen by her PCP today for this complaint and told to come to the ED due to her elevated blood pressure and blood glucose of 419. Last dialysis was Saturday, next round is tomorrow. She does not produce much urine. 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 She has not been admitted to the hospital in the last 3 months. She denies leg swelling, CP. Pt has no Hx of pneumona/  Past Medical History  Diagnosis Date  . Peripheral vascular disease   . Diabetes mellitus without complication   . Hypertension   . Hyperlipidemia   . Renal disorder   . Anemia    No past surgical history on file. No family history on file. History  Substance Use Topics  . Smoking status: Never Smoker   . Smokeless tobacco: Not on file  . Alcohol Use: Yes     Comment: social   OB History    No data available     Review of Systems  Constitutional: Positive for fever and chills. Negative for diaphoresis and fatigue.  HENT: Positive for congestion and rhinorrhea. Negative for sneezing.   Eyes: Negative.   Respiratory: Positive for cough. Negative for chest tightness and shortness of breath.   Cardiovascular: Negative for chest pain and leg swelling.  Gastrointestinal: Positive for nausea, vomiting and diarrhea. Negative for abdominal pain and blood in stool.  Genitourinary:  Negative for frequency, hematuria, flank pain and difficulty urinating.  Musculoskeletal: Positive for myalgias. Negative for back pain and arthralgias.  Skin: Negative for rash.  Neurological: Negative for dizziness, speech difficulty, weakness, numbness and headaches.      Allergies  Review of patient's allergies indicates no known allergies.  Home Medications   Prior to Admission medications   Medication Sig Start Date End Date Taking? Authorizing Provider  amLODipine (NORVASC) 10 MG tablet Take 10 mg by mouth daily.   Yes Historical Provider, MD  aspirin 81 MG EC tablet Take 81 mg by mouth daily. Swallow whole.   Yes Historical Provider, MD  calcium acetate (PHOSLO) 667 MG capsule Take 667 mg by mouth 3 (three) times daily with meals.   Yes Historical Provider, MD  fluticasone (FLONASE) 50 MCG/ACT nasal spray Place 1 spray into both nostrils daily.   Yes Historical Provider, MD  insulin glargine (LANTUS) 100 UNIT/ML injection Inject into the skin daily.   Yes Historical Provider, MD  insulin lispro (HUMALOG) 100 UNIT/ML injection Inject into the skin 3 (three) times daily before meals.   Yes Historical Provider, MD  labetalol (NORMODYNE) 300 MG tablet Take 300 mg by mouth 3 (three) times daily.   Yes Historical Provider, MD  lisinopril-hydrochlorothiazide (PRINZIDE,ZESTORETIC) 20-25 MG per tablet Take 1 tablet by mouth daily.   Yes Historical Provider, MD  montelukast (SINGULAIR) 10 MG tablet Take 10  mg by mouth at bedtime.   Yes Historical Provider, MD  rosuvastatin (CRESTOR) 10 MG tablet Take 10 mg by mouth daily.   Yes Historical Provider, MD   BP 154/82 mmHg  Pulse 93  Temp(Src) 99.3 F (37.4 C) (Oral)  Resp 17  Ht 5\' 2"  (1.575 m)  Wt 120 lb 2 oz (54.488 kg)  BMI 21.97 kg/m2  SpO2 92% Physical Exam  Constitutional: She is oriented to person, place, and time. She appears well-developed and well-nourished.  HENT:  Head: Normocephalic and atraumatic.  Slightly dry mucus  membranes  Eyes: Pupils are equal, round, and reactive to light.  Neck: Normal range of motion. Neck supple.  Cardiovascular: Normal rate, regular rhythm and normal heart sounds.   Pulmonary/Chest: Effort normal and breath sounds normal. No respiratory distress. She has no wheezes. She has no rales. She exhibits no tenderness.  Abdominal: Soft. Bowel sounds are normal. There is no tenderness. There is no rebound and no guarding.  Musculoskeletal: Normal range of motion. She exhibits no edema.  Lymphadenopathy:    She has no cervical adenopathy.  Neurological: She is alert and oriented to person, place, and time.  Skin: Skin is warm and dry. No rash noted.  Psychiatric: She has a normal mood and affect.    ED Course  Procedures  DIAGNOSTIC STUDIES: Oxygen Saturation is 94% on ra, normal by my interpretation.    COORDINATION OF CARE: 10:44 PM Discussed treatment plan with pt at bedside and pt agreed to plan.   Labs Review Labs Reviewed  BASIC METABOLIC PANEL - Abnormal; Notable for the following:    Sodium 131 (*)    Chloride 95 (*)    Glucose, Bld 66 (*)    BUN 47 (*)    Creatinine, Ser 10.19 (*)    GFR calc non Af Amer 4 (*)    GFR calc Af Amer 5 (*)    All other components within normal limits  CBC WITH DIFFERENTIAL/PLATELET - Abnormal; Notable for the following:    RBC 3.66 (*)    Hemoglobin 10.6 (*)    HCT 30.4 (*)    Neutro Abs 8.0 (*)    All other components within normal limits  CBG MONITORING, ED - Abnormal; Notable for the following:    Glucose-Capillary 170 (*)    All other components within normal limits  CBG MONITORING, ED - Abnormal; Notable for the following:    Glucose-Capillary 29 (*)    All other components within normal limits  CBG MONITORING, ED - Abnormal; Notable for the following:    Glucose-Capillary 34 (*)    All other components within normal limits  CBG MONITORING, ED  CBG MONITORING, ED    Imaging Review Dg Chest 2 View  09/11/2014    CLINICAL DATA:  Cough since 09/01/2014. Coughing up blood today. Sent from primary care physician to emergency department for evaluation.  EXAM: CHEST  2 VIEW  COMPARISON:  None.  FINDINGS: Focal airspace disease in the right lower lung most likely representing acute pneumonia. Followup after resolution of acute process recommended to exclude underlying obstructing lesion. Possible mild perihilar infiltration on the left. Heart size and pulmonary vascularity are normal. No blunting of costophrenic angles. No pneumothorax. Mediastinal contours appear intact. Surgical clips in the left axilla.  IMPRESSION: Airspace consolidation in the right lower lung most likely representing pneumonia.   Electronically Signed   By: Lucienne Capers M.D.   On: 09/11/2014 19:15     EKG Interpretation  Date/Time:  Monday September 11 2014 19:12:24 EST Ventricular Rate:  97 PR Interval:  152 QRS Duration: 74 QT Interval:  372 QTC Calculation: 472 R Axis:   62 Text Interpretation:  Normal sinus rhythm Possible Left atrial enlargement  Left ventricular hypertrophy T wave abnormality, consider inferolateral  ischemia Prolonged QT Abnormal ECG No old tracing to compare Confirmed by  [place]  MD, Aeron Lheureux [id number]) on 09/11/2014 9:25:51 PM      MDM   Final diagnoses:  HCAP (healthcare-associated pneumonia)   Pt started on abx for HCAP, given fluids cautiously.  Glucose has dropped in the ED to 29, given D50.  Spoke with Dr. Blaine Hamper at [facility] who has accepted pt for transfor for admission to telemetry.    I personally performed the services described in this documentation, which was scribed in my presence.  The recorded information has been reviewed and considered.      Malvin Johns, MD 09/11/14 2245

## 2014-09-11 NOTE — ED Notes (Signed)
Pt reports she has had a cough since 2/12- today she has been noticing blood mixed in with her sputum when she coughs- Was seen at her PCP office and sent here for eval of HTN and CBG 419- pt's last dialysis was Saturday- next dialysis due at [place] [facility]

## 2014-09-11 NOTE — ED Notes (Signed)
Patient transported to X-ray 

## 2014-09-11 NOTE — Progress Notes (Addendum)
ANTIBIOTIC CONSULT NOTE - INITIAL  Pharmacy Consult for vancomycin Indication: PNA  Allergies not on file  Patient Measurements: Height: 5\' 2"  (157.5 cm) Weight: 120 lb 2 oz (54.488 kg) IBW/kg (Calculated) : 50.1  Vital Signs: Temp: 100.4 F (38 C) (02/22 1804) Temp Source: Oral (02/22 1804) BP: 192/95 mmHg (02/22 1804) Pulse Rate: 104 (02/22 1804) Intake/Output from previous day:   Intake/Output from this shift:    Labs:  Recent Labs  09/11/14 1959  WBC 10.5  HGB 10.6*  PLT 265   CrCl cannot be calculated (Patient has no serum creatinine result on file.). No results for input(s): VANCOTROUGH, VANCOPEAK, VANCORANDOM, GENTTROUGH, GENTPEAK, GENTRANDOM, TOBRATROUGH, TOBRAPEAK, TOBRARND, AMIKACINPEAK, AMIKACINTROU, AMIKACIN in the last 72 hours.   Microbiology: No results found for this or any previous visit (from the past 720 hour(s)).  Medical History: Past Medical History  Diagnosis Date  . Peripheral vascular disease   . Diabetes mellitus without complication   . Hypertension   . Hyperlipidemia   . Renal disorder   . Anemia     Medications:   (Not in a hospital admission)   Assessment: 39 yo F presents on 2/22 with cough. Pharmacy to dose vancomycin for PNA.  Pt has fever of 100.4, WBC wnl. Pt also ESRD (HD TTS) Other labs pending. Given vancomycin 1g IV in the ED.  Goal of Therapy:  Vancomycin trough level 15-20 mcg/ml  Resolution of infection  Plan:  Start vancomycin 500mg  IV QHD TTS Monitor clinical picture F/U HD plans  Rushie Brazel J 09/11/2014,8:10 PM

## 2014-09-11 NOTE — ED Notes (Signed)
MD at bedside. 

## 2014-09-11 NOTE — ED Notes (Signed)
Pt cough with scant amts of blood since 2/12.  Htn.  cbg >400 at pmd.   Arrived via ems from pmd office.

## 2014-09-12 ENCOUNTER — Encounter (HOSPITAL_COMMUNITY): Payer: Self-pay | Admitting: Nephrology

## 2014-09-12 DIAGNOSIS — N186 End stage renal disease: Secondary | ICD-10-CM

## 2014-09-12 DIAGNOSIS — Z992 Dependence on renal dialysis: Secondary | ICD-10-CM

## 2014-09-12 DIAGNOSIS — Z8639 Personal history of other endocrine, nutritional and metabolic disease: Secondary | ICD-10-CM

## 2014-09-12 DIAGNOSIS — J189 Pneumonia, unspecified organism: Secondary | ICD-10-CM

## 2014-09-12 DIAGNOSIS — E109 Type 1 diabetes mellitus without complications: Secondary | ICD-10-CM

## 2014-09-12 HISTORY — DX: Dependence on renal dialysis: N18.6

## 2014-09-12 HISTORY — DX: Type 1 diabetes mellitus without complications: E10.9

## 2014-09-12 HISTORY — DX: Dependence on renal dialysis: Z99.2

## 2014-09-12 LAB — COMPREHENSIVE METABOLIC PANEL
ALT: 11 U/L (ref 0–35)
AST: 12 U/L (ref 0–37)
Albumin: 3.4 g/dL — ABNORMAL LOW (ref 3.5–5.2)
Alkaline Phosphatase: 98 U/L (ref 39–117)
Anion gap: 14 (ref 5–15)
BUN: 44 mg/dL — AB (ref 6–23)
CALCIUM: 8.9 mg/dL (ref 8.4–10.5)
CHLORIDE: 94 mmol/L — AB (ref 96–112)
CO2: 26 mmol/L (ref 19–32)
CREATININE: 10.94 mg/dL — AB (ref 0.50–1.10)
GFR, EST AFRICAN AMERICAN: 5 mL/min — AB (ref >90)
GFR, EST NON AFRICAN AMERICAN: 4 mL/min — AB (ref >90)
GLUCOSE: 122 mg/dL — AB (ref 70–99)
Potassium: 3.6 mmol/L (ref 3.5–5.1)
Sodium: 134 mmol/L — ABNORMAL LOW (ref 135–145)
Total Bilirubin: 0.7 mg/dL (ref 0.3–1.2)
Total Protein: 6.6 g/dL (ref 6.0–8.3)

## 2014-09-12 LAB — INFLUENZA PANEL BY PCR (TYPE A & B)
H1N1 flu by pcr: NOT DETECTED
INFLAPCR: NEGATIVE
INFLBPCR: NEGATIVE

## 2014-09-12 LAB — RENAL FUNCTION PANEL
ANION GAP: 13 (ref 5–15)
Albumin: 3 g/dL — ABNORMAL LOW (ref 3.5–5.2)
BUN: 53 mg/dL — AB (ref 6–23)
CO2: 24 mmol/L (ref 19–32)
CREATININE: 11.61 mg/dL — AB (ref 0.50–1.10)
Calcium: 8.4 mg/dL (ref 8.4–10.5)
Chloride: 90 mmol/L — ABNORMAL LOW (ref 96–112)
GFR calc Af Amer: 4 mL/min — ABNORMAL LOW (ref >90)
GFR calc non Af Amer: 4 mL/min — ABNORMAL LOW (ref >90)
GLUCOSE: 418 mg/dL — AB (ref 70–99)
Phosphorus: 5.7 mg/dL — ABNORMAL HIGH (ref 2.3–4.6)
Potassium: 4.7 mmol/L (ref 3.5–5.1)
Sodium: 127 mmol/L — ABNORMAL LOW (ref 135–145)

## 2014-09-12 LAB — CBC
HCT: 27.4 % — ABNORMAL LOW (ref 36.0–46.0)
HEMATOCRIT: 29.1 % — AB (ref 36.0–46.0)
HEMOGLOBIN: 9.5 g/dL — AB (ref 12.0–15.0)
Hemoglobin: 10.1 g/dL — ABNORMAL LOW (ref 12.0–15.0)
MCH: 28.4 pg (ref 26.0–34.0)
MCH: 28.5 pg (ref 26.0–34.0)
MCHC: 34.7 g/dL (ref 30.0–36.0)
MCHC: 34.7 g/dL (ref 30.0–36.0)
MCV: 81.7 fL (ref 78.0–100.0)
MCV: 82.3 fL (ref 78.0–100.0)
PLATELETS: 257 10*3/uL (ref 150–400)
Platelets: 234 10*3/uL (ref 150–400)
RBC: 3.56 MIL/uL — ABNORMAL LOW (ref 3.87–5.11)
RBC: 3.33 MIL/uL — AB (ref 3.87–5.11)
RDW: 14.3 % (ref 11.5–15.5)
RDW: 14.5 % (ref 11.5–15.5)
WBC: 9.5 10*3/uL (ref 4.0–10.5)
WBC: 8.8 10*3/uL (ref 4.0–10.5)

## 2014-09-12 LAB — GLUCOSE, CAPILLARY
GLUCOSE-CAPILLARY: 269 mg/dL — AB (ref 70–99)
Glucose-Capillary: 230 mg/dL — ABNORMAL HIGH (ref 70–99)
Glucose-Capillary: 388 mg/dL — ABNORMAL HIGH (ref 70–99)

## 2014-09-12 LAB — TSH: TSH: 0.936 u[IU]/mL (ref 0.350–4.500)

## 2014-09-12 MED ORDER — LABETALOL HCL 300 MG PO TABS
300.0000 mg | ORAL_TABLET | Freq: Three times a day (TID) | ORAL | Status: DC
  Administered 2014-09-12 – 2014-09-14 (×6): 300 mg via ORAL
  Filled 2014-09-12 (×11): qty 1

## 2014-09-12 MED ORDER — AZITHROMYCIN 500 MG IV SOLR
500.0000 mg | INTRAVENOUS | Status: DC
  Administered 2014-09-12 – 2014-09-14 (×3): 500 mg via INTRAVENOUS
  Filled 2014-09-12 (×5): qty 500

## 2014-09-12 MED ORDER — ACETAMINOPHEN 650 MG RE SUPP: 650.0000 mg | Freq: Four times a day (QID) | RECTAL | Status: DC | PRN

## 2014-09-12 MED ORDER — LISINOPRIL 20 MG PO TABS
20.0000 mg | ORAL_TABLET | Freq: Every day | ORAL | Status: DC
  Filled 2014-09-12 (×3): qty 1

## 2014-09-12 MED ORDER — DARBEPOETIN ALFA 40 MCG/0.4ML IJ SOSY
40.0000 ug | PREFILLED_SYRINGE | INTRAMUSCULAR | Status: DC
  Administered 2014-09-12: 40 ug via INTRAVENOUS
  Filled 2014-09-12: qty 0.4

## 2014-09-12 MED ORDER — DARBEPOETIN ALFA 40 MCG/0.4ML IJ SOSY
PREFILLED_SYRINGE | INTRAMUSCULAR | Status: AC
Start: 2014-09-12 — End: 2014-09-12
  Administered 2014-09-12: 40 ug via INTRAVENOUS
  Filled 2014-09-12: qty 0.4

## 2014-09-12 MED ORDER — NEPRO/CARBSTEADY PO LIQD
237.0000 mL | ORAL | Status: DC | PRN
  Filled 2014-09-12: qty 237

## 2014-09-12 MED ORDER — DOXERCALCIFEROL 4 MCG/2ML IV SOLN
2.0000 ug | INTRAVENOUS | Status: DC
  Administered 2014-09-12 – 2014-09-14 (×2): 2 ug via INTRAVENOUS
  Filled 2014-09-12 (×2): qty 2

## 2014-09-12 MED ORDER — AMLODIPINE BESYLATE 10 MG PO TABS
10.0000 mg | ORAL_TABLET | Freq: Every day | ORAL | Status: DC
  Administered 2014-09-12 – 2014-09-14 (×3): 10 mg via ORAL
  Filled 2014-09-12 (×3): qty 1

## 2014-09-12 MED ORDER — ASPIRIN EC 81 MG PO TBEC
81.0000 mg | DELAYED_RELEASE_TABLET | Freq: Every day | ORAL | Status: DC
  Administered 2014-09-12 – 2014-09-14 (×3): 81 mg via ORAL
  Filled 2014-09-12 (×3): qty 1

## 2014-09-12 MED ORDER — HEPARIN SODIUM (PORCINE) 1000 UNIT/ML DIALYSIS: 2000.0000 [IU] | INTRAMUSCULAR | Status: DC | PRN

## 2014-09-12 MED ORDER — HEPARIN SODIUM (PORCINE) 1000 UNIT/ML DIALYSIS: 1000.0000 [IU] | INTRAMUSCULAR | Status: DC | PRN

## 2014-09-12 MED ORDER — SODIUM CHLORIDE 0.9 % IV SOLN
INTRAVENOUS | Status: DC
  Administered 2014-09-12: 01:00:00 via INTRAVENOUS

## 2014-09-12 MED ORDER — ADULT MULTIVITAMIN W/MINERALS CH
1.0000 | ORAL_TABLET | Freq: Every day | ORAL | Status: DC
  Administered 2014-09-12 – 2014-09-14 (×3): 1 via ORAL
  Filled 2014-09-12 (×3): qty 1

## 2014-09-12 MED ORDER — ACETAMINOPHEN 325 MG PO TABS: 650.0000 mg | ORAL_TABLET | Freq: Four times a day (QID) | ORAL | Status: DC | PRN

## 2014-09-12 MED ORDER — LIDOCAINE HCL (PF) 1 % IJ SOLN: 5.0000 mL | INTRAMUSCULAR | Status: DC | PRN

## 2014-09-12 MED ORDER — PENTAFLUOROPROP-TETRAFLUOROETH EX AERO: 1.0000 "application " | INHALATION_SPRAY | CUTANEOUS | Status: DC | PRN

## 2014-09-12 MED ORDER — HYDROCHLOROTHIAZIDE 25 MG PO TABS
25.0000 mg | ORAL_TABLET | Freq: Every day | ORAL | Status: DC
  Filled 2014-09-12 (×2): qty 1

## 2014-09-12 MED ORDER — HEPARIN SODIUM (PORCINE) 5000 UNIT/ML IJ SOLN
5000.0000 [IU] | Freq: Three times a day (TID) | INTRAMUSCULAR | Status: DC
  Administered 2014-09-12 – 2014-09-14 (×6): 5000 [IU] via SUBCUTANEOUS
  Filled 2014-09-12 (×10): qty 1

## 2014-09-12 MED ORDER — ROSUVASTATIN CALCIUM 10 MG PO TABS
10.0000 mg | ORAL_TABLET | Freq: Every day | ORAL | Status: DC
Start: 2014-09-12 — End: 2014-09-14
  Administered 2014-09-12 – 2014-09-14 (×3): 10 mg via ORAL
  Filled 2014-09-12 (×3): qty 1

## 2014-09-12 MED ORDER — VITAMIN B-1 100 MG PO TABS
100.0000 mg | ORAL_TABLET | Freq: Every day | ORAL | Status: DC
  Administered 2014-09-12 – 2014-09-14 (×3): 100 mg via ORAL
  Filled 2014-09-12 (×3): qty 1

## 2014-09-12 MED ORDER — ONDANSETRON HCL 4 MG/2ML IJ SOLN: 4.0000 mg | Freq: Four times a day (QID) | INTRAMUSCULAR | Status: DC | PRN

## 2014-09-12 MED ORDER — INSULIN GLARGINE 100 UNIT/ML ~~LOC~~ SOLN
10.0000 [IU] | Freq: Every day | SUBCUTANEOUS | Status: DC
  Administered 2014-09-12 – 2014-09-13 (×2): 10 [IU] via SUBCUTANEOUS
  Filled 2014-09-12 (×2): qty 0.1

## 2014-09-12 MED ORDER — FOLIC ACID 1 MG PO TABS
1.0000 mg | ORAL_TABLET | Freq: Every day | ORAL | Status: DC
  Administered 2014-09-12 – 2014-09-14 (×3): 1 mg via ORAL
  Filled 2014-09-12 (×3): qty 1

## 2014-09-12 MED ORDER — SODIUM CHLORIDE 0.9 % IV SOLN: 100.0000 mL | INTRAVENOUS | Status: DC | PRN

## 2014-09-12 MED ORDER — CALCIUM ACETATE 667 MG PO CAPS
667.0000 mg | ORAL_CAPSULE | Freq: Three times a day (TID) | ORAL | Status: DC
  Administered 2014-09-12 – 2014-09-14 (×7): 667 mg via ORAL
  Filled 2014-09-12 (×10): qty 1

## 2014-09-12 MED ORDER — LISINOPRIL-HYDROCHLOROTHIAZIDE 20-25 MG PO TABS: 1.0000 | ORAL_TABLET | Freq: Every day | ORAL | Status: DC

## 2014-09-12 MED ORDER — PIPERACILLIN-TAZOBACTAM IN DEX 2-0.25 GM/50ML IV SOLN
2.2500 g | Freq: Three times a day (TID) | INTRAVENOUS | Status: DC
  Administered 2014-09-12 – 2014-09-13 (×6): 2.25 g via INTRAVENOUS
  Filled 2014-09-12 (×9): qty 50

## 2014-09-12 MED ORDER — RENA-VITE PO TABS
1.0000 | ORAL_TABLET | Freq: Every day | ORAL | Status: DC
  Administered 2014-09-12 – 2014-09-13 (×2): 1 via ORAL
  Filled 2014-09-12 (×3): qty 1

## 2014-09-12 MED ORDER — LIDOCAINE-PRILOCAINE 2.5-2.5 % EX CREA
1.0000 "application " | TOPICAL_CREAM | CUTANEOUS | Status: DC | PRN
  Filled 2014-09-12: qty 5

## 2014-09-12 MED ORDER — ONDANSETRON HCL 4 MG PO TABS: 4.0000 mg | ORAL_TABLET | Freq: Four times a day (QID) | ORAL | Status: DC | PRN

## 2014-09-12 MED ORDER — FLUTICASONE PROPIONATE 50 MCG/ACT NA SUSP
1.0000 | Freq: Every day | NASAL | Status: DC
  Filled 2014-09-12: qty 16

## 2014-09-12 MED ORDER — ALTEPLASE 2 MG IJ SOLR
2.0000 mg | Freq: Once | INTRAMUSCULAR | Status: AC | PRN
  Filled 2014-09-12: qty 2

## 2014-09-12 MED ORDER — DOXERCALCIFEROL 4 MCG/2ML IV SOLN
INTRAVENOUS | Status: AC
  Administered 2014-09-12: 2 ug via INTRAVENOUS
  Filled 2014-09-12: qty 2

## 2014-09-12 NOTE — Consult Note (Signed)
[facility] Renal Consultation Note  Indication for Consultation:  Management of ESRD/hemodialysis; anemia, hypertension/volume and secondary hyperparathyroidism  HPI: [name] is a 39 y.o. female with a history of Diabetes Type 2, hypertension, and ESRD on dialysis at the [facility] who presented to the ED last night with worsening productive cough for ten days, yesterday associated with fever, chills, nausea, vomiting, and blood-tinged sputum.  Chest x-ray showed right lower lung consolidation suggesting pneumonia.   She was admitted and started on IV Zosyn and Azithromycin.  She denies any dyspnea or diarrhea, and currently she is afefrile and feeling better.   Dialysis Orders:  TTS @ AF 3:30     51.5 kg      2K/2Ca        425/A1.5       Heparin 2000 U       AVG @ LUA Hectorol 2 mcg          Aranesp 40 mcg on Tues        No Venofer  Past Medical History  Diagnosis Date  . Peripheral vascular disease   . Diabetes mellitus without complication   . Hypertension   . Hyperlipidemia   . Renal disorder   . Anemia    No past surgical history on file. No family history on file.  Social History  She denies any history of tobacco, alcohol, or illicit drug use.  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  No Known Allergies Prior to Admission medications   Medication Sig Start Date End Date Taking? Authorizing Provider  amLODipine (NORVASC) 10 MG tablet Take 10 mg by mouth daily.   Yes Historical Provider, MD  aspirin 81 MG EC tablet Take 81 mg by mouth daily. Swallow whole.   Yes Historical Provider, MD  calcium acetate (PHOSLO) 667 MG capsule Take 667 mg by mouth 3 (three) times daily with meals.   Yes Historical Provider, MD  cinacalcet (SENSIPAR) 30 MG tablet Take 30 mg by mouth daily.   Yes Historical Provider, MD  insulin glargine (LANTUS) 100 UNIT/ML injection Inject 10  Units into the skin daily.    Yes Historical Provider, MD  insulin lispro (HUMALOG) 100 UNIT/ML injection Inject 5-15 Units into the skin 3 (three) times daily before meals. 1 units for ever 20 gms of carbs   Yes Historical Provider, MD  labetalol (NORMODYNE) 300 MG tablet Take 300 mg by mouth 3 (three) times daily.   Yes Historical Provider, MD  rosuvastatin (CRESTOR) 10 MG tablet Take 10 mg by mouth daily.   Yes Historical Provider, MD   Labs:  Results for orders placed or performed during the hospital encounter of 09/11/14 (from the past 48 hour(s))  POC CBG, ED     Status: Abnormal   Collection Time: 09/11/14  6:45 PM  Result Value Ref Range   Glucose-Capillary 170 (H) 70 - 99 mg/dL  Basic metabolic panel     Status: Abnormal   Collection Time: 09/11/14  7:59 PM  Result Value Ref Range   Sodium 131 (L) 135 - 145 mmol/L   Potassium 3.6 3.5 - 5.1 mmol/L   Chloride 95 (L) 96 - 112 mmol/L   CO2 25 19 - 32 mmol/L   Glucose, Bld 66 (L) 70 - 99 mg/dL   BUN 47 (H) 6 - 23 mg/dL   Creatinine, Ser 10.19 (H) 0.50 - 1.10 mg/dL  Calcium 8.6 8.4 - 10.5 mg/dL   GFR calc non Af Amer 4 (L) >90 mL/min   GFR calc Af Amer 5 (L) >90 mL/min    Comment: (NOTE) The eGFR has been calculated using the CKD EPI equation. This calculation has not been validated in all clinical situations. eGFR's persistently <90 mL/min signify possible Chronic Kidney Disease.    Anion gap 11 5 - 15  CBC with Differential     Status: Abnormal   Collection Time: 09/11/14  7:59 PM  Result Value Ref Range   WBC 10.5 4.0 - 10.5 K/uL   RBC 3.66 (L) 3.87 - 5.11 MIL/uL   Hemoglobin 10.6 (L) 12.0 - 15.0 g/dL   HCT 30.4 (L) 36.0 - 46.0 %   MCV 83.1 78.0 - 100.0 fL   MCH 29.0 26.0 - 34.0 pg   MCHC 34.9 30.0 - 36.0 g/dL   RDW 13.5 11.5 - 15.5 %   Platelets 265 150 - 400 K/uL   Neutrophils Relative % 77 43 - 77 %   Neutro Abs 8.0 (H) 1.7 - 7.7 K/uL   Lymphocytes Relative 14 12 - 46 %   Lymphs Abs 1.5 0.7 - 4.0 K/uL    Monocytes Relative 7 3 - 12 %   Monocytes Absolute 0.8 0.1 - 1.0 K/uL   Eosinophils Relative 1 0 - 5 %   Eosinophils Absolute 0.1 0.0 - 0.7 K/uL   Basophils Relative 1 0 - 1 %   Basophils Absolute 0.1 0.0 - 0.1 K/uL  CBG monitoring, ED     Status: Abnormal   Collection Time: 09/11/14  8:13 PM  Result Value Ref Range   Glucose-Capillary 29 (LL) 70 - 99 mg/dL   Comment 1 Notify RN   CBG monitoring, ED     Status: Abnormal   Collection Time: 09/11/14  8:14 PM  Result Value Ref Range   Glucose-Capillary 34 (LL) 70 - 99 mg/dL   Comment 1 Notify RN   CBG monitoring, ED     Status: None   Collection Time: 09/11/14  9:49 PM  Result Value Ref Range   Glucose-Capillary 86 70 - 99 mg/dL  POC CBG, ED     Status: None   Collection Time: 09/11/14 10:55 PM  Result Value Ref Range   Glucose-Capillary 74 70 - 99 mg/dL  TSH     Status: None   Collection Time: 09/12/14  1:30 AM  Result Value Ref Range   TSH 0.936 0.350 - 4.500 uIU/mL  CBC     Status: Abnormal   Collection Time: 09/12/14  1:30 AM  Result Value Ref Range   WBC 9.5 4.0 - 10.5 K/uL   RBC 3.56 (L) 3.87 - 5.11 MIL/uL   Hemoglobin 10.1 (L) 12.0 - 15.0 g/dL   HCT 29.1 (L) 36.0 - 46.0 %   MCV 81.7 78.0 - 100.0 fL   MCH 28.4 26.0 - 34.0 pg   MCHC 34.7 30.0 - 36.0 g/dL   RDW 14.3 11.5 - 15.5 %   Platelets 257 150 - 400 K/uL  Comprehensive metabolic panel     Status: Abnormal   Collection Time: 09/12/14  1:30 AM  Result Value Ref Range   Sodium 134 (L) 135 - 145 mmol/L   Potassium 3.6 3.5 - 5.1 mmol/L   Chloride 94 (L) 96 - 112 mmol/L   CO2 26 19 - 32 mmol/L   Glucose, Bld 122 (H) 70 - 99 mg/dL   BUN 44 (H) 6 - 23  mg/dL   Creatinine, Ser 10.94 (H) 0.50 - 1.10 mg/dL   Calcium 8.9 8.4 - 10.5 mg/dL   Total Protein 6.6 6.0 - 8.3 g/dL   Albumin 3.4 (L) 3.5 - 5.2 g/dL   AST 12 0 - 37 U/L   ALT 11 0 - 35 U/L   Alkaline Phosphatase 98 39 - 117 U/L   Total Bilirubin 0.7 0.3 - 1.2 mg/dL   GFR calc non Af Amer 4 (L) >90 mL/min   GFR  calc Af Amer 5 (L) >90 mL/min    Comment: (NOTE) The eGFR has been calculated using the CKD EPI equation. This calculation has not been validated in all clinical situations. eGFR's persistently <90 mL/min signify possible Chronic Kidney Disease.    Anion gap 14 5 - 15  Influenza panel by pcr     Status: None   Collection Time: 09/12/14  1:35 AM  Result Value Ref Range   Influenza A By PCR NEGATIVE NEGATIVE   Influenza B By PCR NEGATIVE NEGATIVE   H1N1 flu by pcr NOT DETECTED NOT DETECTED    Comment:        The Xpert Flu assay (FDA approved for nasal aspirates or washes and nasopharyngeal swab specimens), is intended as an aid in the diagnosis of influenza and should not be used as a sole basis for treatment.   Glucose, capillary     Status: Abnormal   Collection Time: 09/12/14  7:03 AM  Result Value Ref Range   Glucose-Capillary 230 (H) 70 - 99 mg/dL   Constitutional: positive for fatigue, negative for chills, fevers and sweats Ears, nose, mouth, throat, and face: negative for earaches, hoarseness, nasal congestion and sore throat Respiratory: positive for cough and sputum (blood-tinged yesterday), negative for dyspnea on exertion Cardiovascular: positive for orthopnea; negative for chest pain, chest pressure/discomfort, dyspnea, orthopnea and palpitations Gastrointestinal: negative for abdominal pain, change in bowel habits, nausea and vomiting resolved Genitourinary:negative, oliguric Musculoskeletal:negative for arthralgias, back pain, myalgias and neck pain Neurological: negative for dizziness, gait problems, headaches, paresthesia and speech problems  Physical Exam: Filed Vitals:   09/12/14 0940  BP: 162/84  Pulse: 97  Temp:   Resp:      General appearance: alert, cooperative and no distress Head: Normocephalic, without obvious abnormality, atraumatic Neck: no adenopathy, no carotid bruit, no JVD and supple, symmetrical, trachea midline Resp: clear to auscultation  bilaterally coarse rales/ rhonchi R base Cardio: regular rate and rhythm, S1, S2 normal, no murmur, click, rub or gallop GI: soft, non-tender; bowel sounds normal; no masses,  no organomegaly Extremities: extremities normal, atraumatic, no cyanosis or edema Neurologic: Grossly normal Dialysis Access: AVG @ LUA with + bruit   Assessment/Plan: 1. RLL pneumonia - per CXR, now afebrile, improving on IV Zosyn & Azithromycin. 2. ESRD - HD on TTS @ AF, K 3.6.  HD pending today. 3. Hypertension/volume - BP 162/84, on Amlodipine 10 mg qd, Labetalol 300 mg tid, Lisinopril 20 mg qd; wt 54.2 kg with HD pending.  4. Anemia - Hgb 10.1 on Aranesp 40 mcg on Tues, recent Fe loading for T-sat 26%. 5. Metabolic bone disease - Ca 8.9 (9.4 corrected), last P 7.7, iPTH 708; Hectorol 2 mcg, Phoslo with meals. 6. Nutrition - Alb 3.4, renal carb-mod diet, vitamin. 7. DM Type 2 - insulin per primary, last A1C 9.9.   LYLES,CHARLES 09/12/2014, 11:21 AM   Attending Nephrologist:   Roney Jaffe, MD  Pt seen, examined, agree w assess/plan as above with additions as  indicated.  Kelly Splinter MD pager [phone]    cell [phone] 09/12/2014, 2:34 PM

## 2014-09-12 NOTE — Procedures (Signed)
I was present at this dialysis session, have reviewed the session itself and made  appropriate changes  Kelly Splinter MD (pgr) [phone]    [phone] 09/12/2014, 4:34 PM

## 2014-09-12 NOTE — Progress Notes (Signed)
Inpatient Diabetes Program Recommendations  AACE/ADA: New Consensus Statement on Inpatient Glycemic Control (2013)  Target Ranges:  Prepandial:   less than 140 mg/dL      Peak postprandial:   less than 180 mg/dL (1-2 hours)      Critically ill patients:  140 - 180 mg/dL   Pt on home dose basal lantus 10 units. May also need low dose correction with meals: Inpatient Diabetes Program Recommendations Correction (SSI): May want to add low dose novolog corretion tidwc-Senstive tidwc  Thank you Rosita Kea, RN, MSN, CDE  Diabetes Inpatient Program Office: [phone] Pager: [phone]

## 2014-09-12 NOTE — Progress Notes (Signed)
UR Completed.  [phone]  

## 2014-09-12 NOTE — H&P (Addendum)
..  [facility] History and Physical  [name] [id number] DOB: 04-08-1976 DOA: 09/11/2014  Referring physician: Malvin Johns, MD PCP: No primary care provider on file.   Chief Complaint: Pneumonia  HPI: [name] is a 39 y.o. female presents with pneumonia. Patient states that she had been well until about February 12th. Patient states that she developed a cough at that time which was productive of yellow sputum. She states that she had some blood in it yesterday. Patient states that she had been also running fevers about 100.24F. She states states that she had no shortness of breath. She was noted to have a low pulsox however in the ED. 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 In addition she states that she has renal failure and is on HD 3x per week. She has had her flu shot and pneumonia shot up to date. She does not smoke and does not drink. She has no HIV risk factors noted. She is also a diabetic. She went to her PCP today and was noted to have an elevated WBC and was sent to the ED> In the ED a CXR was done and this shows presence of a RLL infiltrate.   Review of Systems:  Constitutional:  No weight loss, night sweats, ++Fevers, chills, ++fatigue.  HEENT:  No headaches, No sneezing, itching, ear ache, nasal congestion, post nasal drip,  Cardio-vascular:  No chest pain, Orthopnea, PND, swelling in lower extremities, dizziness, palpitations  GI:  No heartburn, indigestion, abdominal pain, nausea, ++vomiting, ++diarrhea  Resp:  No shortness of breath with exertion currently. ++productive cough, ++coughing up of blood.  Skin:  no rash or lesions.  GU:  no dysuria, change in color of urine, no urgency or frequency Musculoskeletal:  No joint pain or swelling. No decreased range of motion. No back pain.  Psych:  No change in mood or affect. No depression or anxiety. No memory loss.    Past Medical History  Diagnosis Date  . Peripheral vascular disease   . Diabetes mellitus without complication   . Hypertension   . Hyperlipidemia   . Renal disorder   . Anemia    No past surgical history on file. Social History:  reports that she has never smoked. She does not have any smokeless tobacco history on file. She reports that she drinks alcohol. She reports that she does not use illicit drugs.  No Known Allergies  No family history on file.   Prior to Admission medications   Medication Sig Start Date End Date Taking? Authorizing Provider  amLODipine (NORVASC) 10 MG tablet Take 10 mg by mouth daily.   Yes Historical Provider, MD  aspirin 81 MG EC tablet Take 81 mg by mouth daily. Swallow whole.   Yes Historical Provider, MD  calcium acetate (PHOSLO) 667 MG capsule Take 667 mg by mouth 3 (three) times daily with meals.   Yes Historical Provider, MD  fluticasone (FLONASE) 50 MCG/ACT nasal spray Place 1 spray into both nostrils daily.   Yes Historical Provider, MD  insulin glargine (LANTUS) 100 UNIT/ML injection Inject into the skin daily.   Yes Historical Provider, MD  insulin lispro (HUMALOG) 100 UNIT/ML injection Inject into the skin 3 (three) times daily before meals.   Yes Historical Provider, MD  labetalol (NORMODYNE) 300 MG tablet Take 300 mg by mouth 3 (three) times daily.   Yes Historical Provider, MD  lisinopril-hydrochlorothiazide (PRINZIDE,ZESTORETIC) 20-25  MG per tablet Take 1 tablet by mouth daily.   Yes Historical Provider, MD  montelukast (SINGULAIR) 10 MG tablet Take 10 mg by mouth at bedtime.   Yes Historical Provider, MD  rosuvastatin (CRESTOR) 10 MG tablet Take 10 mg by mouth daily.   Yes Historical Provider, MD   Physical Exam: Filed Vitals:   09/11/14 2200 09/11/14 2230 09/11/14 2252 09/11/14 2340  BP: 154/82 145/82 164/86 151/80  Pulse: 93 89 87 86  Temp:    98.2 F (36.8 C)  TempSrc:    Oral  Resp: 17 18 17 17   Height:    5\' 2"  (1.575 m)   Weight:    54.2 kg (119 lb 7.8 oz)  SpO2: 92% 92% 92% 96%    Wt Readings from Last 3 Encounters:  09/11/14 54.2 kg (119 lb 7.8 oz)    General:  Appears calm and comfortable Eyes: PERRL, normal lids, irises & conjunctiva ENT: grossly normal hearing, lips & tongue Neck: no LAD, masses or thyromegaly Cardiovascular: RRR, no m/r/g. No LE edema Respiratory: few RLL crackles noted no ronchi Normal respiratory effort. Abdomen: soft, ntnd Skin: no rash or induration seen on limited exam Musculoskeletal: grossly normal tone BUE/BLE Psychiatric: grossly normal mood and affect, speech fluent and appropriate Neurologic: grossly non-focal.          Labs on Admission:  Basic Metabolic Panel:  Recent Labs Lab 09/11/14 1959  NA 131*  K 3.6  CL 95*  CO2 25  GLUCOSE 66*  BUN 47*  CREATININE 10.19*  CALCIUM 8.6   Liver Function Tests: No results for input(s): AST, ALT, ALKPHOS, BILITOT, PROT, ALBUMIN in the last 168 hours. No results for input(s): LIPASE, AMYLASE in the last 168 hours. No results for input(s): AMMONIA in the last 168 hours. CBC:  Recent Labs Lab 09/11/14 1959  WBC 10.5  NEUTROABS 8.0*  HGB 10.6*  HCT 30.4*  MCV 83.1  PLT 265   Cardiac Enzymes: No results for input(s): CKTOTAL, CKMB, CKMBINDEX, TROPONINI in the last 168 hours.  BNP (last 3 results) No results for input(s): BNP in the last 8760 hours.  ProBNP (last 3 results) No results for input(s): PROBNP in the last 8760 hours.  CBG:  Recent Labs Lab 09/11/14 1845 09/11/14 2013 09/11/14 2014 09/11/14 2149 09/11/14 2255  GLUCAP 170* 29* 34* 86 74    Radiological Exams on Admission: Dg Chest 2 View  09/11/2014   CLINICAL DATA:  Cough since 09/01/2014. Coughing up blood today. Sent from primary care physician to emergency department for evaluation.  EXAM: CHEST  2 VIEW  COMPARISON:  None.  FINDINGS: Focal airspace disease in the right lower lung most likely representing acute pneumonia.  Followup after resolution of acute process recommended to exclude underlying obstructing lesion. Possible mild perihilar infiltration on the left. Heart size and pulmonary vascularity are normal. No blunting of costophrenic angles. No pneumothorax. Mediastinal contours appear intact. Surgical clips in the left axilla.  IMPRESSION: Airspace consolidation in the right lower lung most likely representing pneumonia.   Electronically Signed   By: Lucienne Capers M.D.   On: 09/11/2014 19:15     Assessment/Plan Active Problems:   HCAP (healthcare-associated pneumonia)   Pneumonia   CAP (community acquired pneumonia)   1. HCAP -she will be treated as a HCAP due to her dialysis -will be started on Vancocin and Azithromycin and zosyn -pharmacy will dose due to renal failure -will collect cultures of sputum and blood  2. Diabetes Mellitus -will  continue with her home insulin regimen -monitor FSBS -check A1C  3. CKD On dialysis -due for dialysis today -nephrology consult  4. HTN -will continue with home medications  5. Hyperlipidemia -continue statin   Code Status: Full Code (must indicate code status--if unknown or must be presumed, indicate so) DVT Prophylaxis:heparin Family Communication: none (indicate person spoken with, if applicable, with phone number if by telephone) Disposition Plan: home (indicate anticipated LOS)  Time spent: 64min  Alejandro Gamel A [facility] Pager [phone]

## 2014-09-12 NOTE — Progress Notes (Signed)
39 year old lady presented with community acquired pneumonia. She was on IV antibiotics. ESRD on HD. Renal consulted.  No new events.   Hosie Poisson, MD [phone]

## 2014-09-13 DIAGNOSIS — N186 End stage renal disease: Secondary | ICD-10-CM

## 2014-09-13 DIAGNOSIS — E785 Hyperlipidemia, unspecified: Secondary | ICD-10-CM

## 2014-09-13 DIAGNOSIS — Z992 Dependence on renal dialysis: Secondary | ICD-10-CM

## 2014-09-13 DIAGNOSIS — E1021 Type 1 diabetes mellitus with diabetic nephropathy: Secondary | ICD-10-CM

## 2014-09-13 LAB — HEMOGLOBIN A1C
HEMOGLOBIN A1C: 10.7 % — AB (ref 4.8–5.6)
Mean Plasma Glucose: 260 mg/dL

## 2014-09-13 LAB — GLUCOSE, CAPILLARY
GLUCOSE-CAPILLARY: 153 mg/dL — AB (ref 70–99)
Glucose-Capillary: 453 mg/dL — ABNORMAL HIGH (ref 70–99)
Glucose-Capillary: 221 mg/dL — ABNORMAL HIGH (ref 70–99)
Glucose-Capillary: 171 mg/dL — ABNORMAL HIGH (ref 70–99)

## 2014-09-13 MED ORDER — INSULIN ASPART 100 UNIT/ML ~~LOC~~ SOLN
0.0000 [IU] | Freq: Three times a day (TID) | SUBCUTANEOUS | Status: DC
  Administered 2014-09-13: 9 [IU] via SUBCUTANEOUS
  Administered 2014-09-13: 2 [IU] via SUBCUTANEOUS
  Administered 2014-09-13: 3 [IU] via SUBCUTANEOUS
  Administered 2014-09-14: 7 [IU] via SUBCUTANEOUS

## 2014-09-13 MED ORDER — INSULIN ASPART 100 UNIT/ML ~~LOC~~ SOLN: 0.0000 [IU] | Freq: Three times a day (TID) | SUBCUTANEOUS | Status: DC

## 2014-09-13 MED ORDER — BUDESONIDE 0.25 MG/2ML IN SUSP
0.2500 mg | Freq: Two times a day (BID) | RESPIRATORY_TRACT | Status: DC
  Administered 2014-09-13 – 2014-09-14 (×3): 0.25 mg via RESPIRATORY_TRACT
  Filled 2014-09-13 (×5): qty 2

## 2014-09-13 MED ORDER — INSULIN ASPART 100 UNIT/ML ~~LOC~~ SOLN
0.0000 [IU] | Freq: Every day | SUBCUTANEOUS | Status: DC
Start: 2014-09-13 — End: 2014-09-14

## 2014-09-13 MED ORDER — INSULIN GLARGINE 100 UNIT/ML ~~LOC~~ SOLN
12.0000 [IU] | Freq: Every day | SUBCUTANEOUS | Status: DC
  Filled 2014-09-13: qty 0.12

## 2014-09-13 NOTE — Plan of Care (Signed)
Problem: Phase II Progression Outcomes Goal: Encourage coughing & deep breathing Progressing Goal: Wean O2 if indicated Weaned to off  Goal: Pain controlled Progressing  Goal: Tolerating diet Progressing

## 2014-09-13 NOTE — Progress Notes (Signed)
Subjective:  Occasional productive cough, no dyspnea, no fever, improving appetite  Objective: Vital signs in last 24 hours: Temp:  [98.5 F (36.9 C)-100 F (37.8 C)] 98.5 F (36.9 C) (02/24 0627) Pulse Rate:  [91-103] 91 (02/24 0627) Resp:  [9-26] 18 (02/24 0627) BP: (145-167)/(72-92) 153/72 mmHg (02/24 0627) SpO2:  [90 %-95 %] 93 % (02/24 0627) Weight:  [52 kg (114 lb 10.2 oz)-55.1 kg (121 lb 7.6 oz)] 52 kg (114 lb 10.2 oz) (02/23 1725) Weight change: 0.612 kg (1 lb 5.6 oz)  Intake/Output from previous day: 02/23 0701 - 02/24 0700 In: 120 [P.O.:120] Out: 3000  Intake/Output this shift:  Lab Results:  Recent Labs  09/12/14 0130 09/12/14 1200  WBC 9.5 8.8  HGB 10.1* 9.5*  HCT 29.1* 27.4*  PLT 257 234   BMET:  Recent Labs  09/12/14 0130 09/12/14 1200  NA 134* 127*  K 3.6 4.7  CL 94* 90*  CO2 26 24  GLUCOSE 122* 418*  BUN 44* 53*  CREATININE 10.94* 11.61*  CALCIUM 8.9 8.4  ALBUMIN 3.4* 3.0*   No results for input(s): PTH in the last 72 hours. Iron Studies: No results for input(s): IRON, TIBC, TRANSFERRIN, FERRITIN in the last 72 hours.  Studies/Results: No results found.   Inpatient Medications: . amLODipine  10 mg Oral Daily  . aspirin EC  81 mg Oral Daily  . azithromycin  500 mg Intravenous Q24H  . calcium acetate  667 mg Oral TID WC  . darbepoetin (ARANESP) injection - DIALYSIS  40 mcg Intravenous Q Tue-HD  . doxercalciferol  2 mcg Intravenous Q T,Th,Sa-HD  . fluticasone  1 spray Each Nare Daily  . folic acid  1 mg Oral Daily  . heparin  5,000 Units Subcutaneous 3 times per day  . hydrochlorothiazide  25 mg Oral Daily  . insulin aspart  0-5 Units Subcutaneous QHS  . insulin aspart  0-9 Units Subcutaneous TID WC  . insulin glargine  10 Units Subcutaneous Daily  . labetalol  300 mg Oral TID  . lisinopril  20 mg Oral Daily  . multivitamin  1 tablet Oral QHS  . multivitamin with minerals  1 tablet Oral Daily  . piperacillin-tazobactam (ZOSYN)  IV   2.25 g Intravenous 3 times per day  . rosuvastatin  10 mg Oral Daily  . thiamine  100 mg Oral Daily    EXAM: General appearance:  Alert, in no apparent distress Resp:  Mild bibasilar rales (R > L) Cardio:  RRR without murmur or rub GI:  + BS, soft and nontender Extremities: No edema Access:  AVG @ LUA with + bruit  Dialysis Orders: TTS @ AF 3:30 51.5 kg 2K/2Ca 425/A1.5 Heparin 2000 U AVG @ LUA Hectorol 2 mcg Aranesp 40 mcg on Tues No Venofer  Assessment/Plan: 1. RLL pneumonia - per CXR, afebrile, improving on IV Zosyn & Azithromycin. 2. ESRD - HD on TTS @ AF, K 4.7. HD tomorrow. 3. HTN/volume - BP 153/72, on Amlodipine 10 mg qd, Labetalol 300 mg tid, Lisinopril 20 mg qd; wt 52 kg s/p net UF 3 L yesterday. 4. Anemia - Hgb 10.1 on Aranesp 40 mcg on Tues, recent Fe loading for T-sat 26%. 5. Metabolic bone disease - Ca 8.4 (9.2 corrected), P 5.7, iPTH 708; Hectorol 2 mcg, Phoslo with meals. 6. Nutrition - Alb 3, renal carb-mod diet, vitamin. 7. DM Type 2 - insulin per primary, last A1C 9.9.    LOS: 2 days   LYLES,CHARLES 09/13/2014,10:38 AM  Pt seen, examined and agree w A/P as above.  Kelly Splinter MD pager [phone]    cell [phone] 09/13/2014, 3:16 PM

## 2014-09-13 NOTE — Progress Notes (Signed)
[facility] PROGRESS NOTE  [name] [id number] DOB: May 04, 1976 DOA: 09/11/2014 PCP: No primary care provider on file.  Assessment/Plan: 1-healthcare associated pneumonia -Improving -No fever and WBCs within normal limits -We will continue current IV antibiotics times one more day -Started on Pulmicort and flutter valve -Continue supportive care with as needed antitussives  2-type 1 diabetes mellitus with chronic nephropathy -Continue sliding scale insulin -Patient Levemir has been adjusted for better sugar control -Continue modified carbohydrate diet -A1c pending at this moment  3-essential hypertension: Continue Norvasc, labetalol and lisinopril -Prior to admission patient was an ACT C but given the fact that she is on chronic hemodialysis this medication will be discontinued. -advised to follow low sodium diet -volume to be managed by hemodialysis  4-chronic anemia: Continue Aranesp  5-hyperlipidemia: Continue Crestor  6-secondary hyperparathyroidism: Continue PhosLo and follow renal service recommendations  Code Status: Full code Family Communication: No family at bedside Disposition Plan: Home when medically stable. (Most likely on 09/14/2014 after hemodialysis treatment).   Consultants:  Renal service  Procedures:  See below for x-ray reports  Inpatient Hemodialysis  Antibiotics:  Zosyn 2/22  Zithromax 2/22  HPI/Subjective: Feeling better and breathing better. Still with intermittent coughing spells. No fever. Patient denies chest pain  Objective: Filed Vitals:   09/13/14 1300  BP: 129/72  Pulse: 83  Temp: 98.4 F (36.9 C)  Resp: 20    Intake/Output Summary (Last 24 hours) at 09/13/14 1623 Last data filed at 09/13/14 1230  Gross per 24 hour  Intake    240 ml  Output   3001 ml  Net  -2761 ml   Filed Weights   09/11/14 2340 09/12/14 1322 09/12/14 1725  Weight: 54.2 kg (119 lb 7.8 oz) 55.1 kg (121 lb 7.6 oz) 52 kg (114 lb  10.2 oz)    Exam:   General:  Afebrile, denies chest pain or palpitations. Still with intermittent episodes of cough  Cardiovascular: S1 and S2, positive systolic ejection murmur, no rubs, no gallops  Respiratory: Scattered rhonchi, no crackles; slight expiratory wheezing  Abdomen: Soft, nontender, nondistended, positive bowel sounds, no guarding  Musculoskeletal: No lower extremity edema or cyanosis appreciated on exam.  Data Reviewed: Basic Metabolic Panel:  Recent Labs Lab 09/11/14 1959 09/12/14 0130 09/12/14 1200  NA 131* 134* 127*  K 3.6 3.6 4.7  CL 95* 94* 90*  CO2 25 26 24   GLUCOSE 66* 122* 418*  BUN 47* 44* 53*  CREATININE 10.19* 10.94* 11.61*  CALCIUM 8.6 8.9 8.4  PHOS  --   --  5.7*   Liver Function Tests:  Recent Labs Lab 09/12/14 0130 09/12/14 1200  AST 12  --   ALT 11  --   ALKPHOS 98  --   BILITOT 0.7  --   PROT 6.6  --   ALBUMIN 3.4* 3.0*   CBC:  Recent Labs Lab 09/11/14 1959 09/12/14 0130 09/12/14 1200  WBC 10.5 9.5 8.8  NEUTROABS 8.0*  --   --   HGB 10.6* 10.1* 9.5*  HCT 30.4* 29.1* 27.4*  MCV 83.1 81.7 82.3  PLT 265 257 234   CBG:  Recent Labs Lab 09/12/14 0703 09/12/14 1149 09/12/14 2117 09/13/14 0623 09/13/14 1121  GLUCAP 230* 388* 269* 453* 221*    Recent Results (from the past 240 hour(s))  Culture, blood (routine x 2) Call MD if unable to obtain prior to antibiotics being given     Status: None (Preliminary result)   Collection Time: 09/12/14  1:30 AM  Result Value Ref Range Status   Specimen Description BLOOD RIGHT HAND  Final   Special Requests BOTTLES DRAWN AEROBIC AND ANAEROBIC 5CC  Final   Culture   Final           BLOOD CULTURE RECEIVED NO GROWTH TO DATE CULTURE WILL BE HELD FOR 5 DAYS BEFORE ISSUING A FINAL NEGATIVE REPORT Performed at [facility]    Report Status PENDING  Incomplete  Culture, blood (routine x 2) Call MD if unable to obtain prior to antibiotics being given     Status: None  (Preliminary result)   Collection Time: 09/12/14  1:45 AM  Result Value Ref Range Status   Specimen Description BLOOD RIGHT ARM  Final   Special Requests BOTTLES DRAWN AEROBIC AND ANAEROBIC 5CC  Final   Culture   Final           BLOOD CULTURE RECEIVED NO GROWTH TO DATE CULTURE WILL BE HELD FOR 5 DAYS BEFORE ISSUING A FINAL NEGATIVE REPORT Performed at [facility]    Report Status PENDING  Incomplete     Studies: Dg Chest 2 View  09/11/2014   CLINICAL DATA:  Cough since 09/01/2014. Coughing up blood today. Sent from primary care physician to emergency department for evaluation.  EXAM: CHEST  2 VIEW  COMPARISON:  None.  FINDINGS: Focal airspace disease in the right lower lung most likely representing acute pneumonia. Followup after resolution of acute process recommended to exclude underlying obstructing lesion. Possible mild perihilar infiltration on the left. Heart size and pulmonary vascularity are normal. No blunting of costophrenic angles. No pneumothorax. Mediastinal contours appear intact. Surgical clips in the left axilla.  IMPRESSION: Airspace consolidation in the right lower lung most likely representing pneumonia.   Electronically Signed   By: Lucienne Capers M.D.   On: 09/11/2014 19:15    Scheduled Meds: . amLODipine  10 mg Oral Daily  . aspirin EC  81 mg Oral Daily  . azithromycin  500 mg Intravenous Q24H  . budesonide (PULMICORT) nebulizer solution  0.25 mg Nebulization BID  . calcium acetate  667 mg Oral TID WC  . darbepoetin (ARANESP) injection - DIALYSIS  40 mcg Intravenous Q Tue-HD  . doxercalciferol  2 mcg Intravenous Q T,Th,Sa-HD  . fluticasone  1 spray Each Nare Daily  . folic acid  1 mg Oral Daily  . heparin  5,000 Units Subcutaneous 3 times per day  . hydrochlorothiazide  25 mg Oral Daily  . insulin aspart  0-5 Units Subcutaneous QHS  . insulin aspart  0-9 Units Subcutaneous TID WC  . [START ON 09/14/2014] insulin glargine  12 Units Subcutaneous Daily  .  labetalol  300 mg Oral TID  . lisinopril  20 mg Oral Daily  . multivitamin  1 tablet Oral QHS  . multivitamin with minerals  1 tablet Oral Daily  . piperacillin-tazobactam (ZOSYN)  IV  2.25 g Intravenous 3 times per day  . rosuvastatin  10 mg Oral Daily  . thiamine  100 mg Oral Daily   Continuous Infusions:   Active Problems:   HCAP (healthcare-associated pneumonia)   Pneumonia   CAP (community acquired pneumonia)   ESRD on hemodialysis   DM type 1 (diabetes mellitus, type 1)   History of diabetic retinopathy    Time spent:  12 minutes     Barton Dubois  [facility] Pager [phone]. If 7PM-7AM, please contact night-coverage at [email or web address], password [facility] 09/13/2014, 4:23 PM  LOS: 2 days

## 2014-09-14 DIAGNOSIS — I10 Essential (primary) hypertension: Secondary | ICD-10-CM

## 2014-09-14 DIAGNOSIS — N2581 Secondary hyperparathyroidism of renal origin: Secondary | ICD-10-CM

## 2014-09-14 DIAGNOSIS — E1022 Type 1 diabetes mellitus with diabetic chronic kidney disease: Secondary | ICD-10-CM

## 2014-09-14 DIAGNOSIS — N189 Chronic kidney disease, unspecified: Secondary | ICD-10-CM

## 2014-09-14 LAB — RENAL FUNCTION PANEL
ALBUMIN: 2.9 g/dL — AB (ref 3.5–5.2)
Anion gap: 18 — ABNORMAL HIGH (ref 5–15)
BUN: 30 mg/dL — AB (ref 6–23)
CALCIUM: 9.1 mg/dL (ref 8.4–10.5)
CO2: 24 mmol/L (ref 19–32)
CREATININE: 8.7 mg/dL — AB (ref 0.50–1.10)
Chloride: 90 mmol/L — ABNORMAL LOW (ref 96–112)
GFR calc Af Amer: 6 mL/min — ABNORMAL LOW (ref >90)
GFR calc non Af Amer: 5 mL/min — ABNORMAL LOW (ref >90)
Glucose, Bld: 322 mg/dL — ABNORMAL HIGH (ref 70–99)
Phosphorus: 5.2 mg/dL — ABNORMAL HIGH (ref 2.3–4.6)
Potassium: 3.8 mmol/L (ref 3.5–5.1)
Sodium: 132 mmol/L — ABNORMAL LOW (ref 135–145)

## 2014-09-14 LAB — CBC
HEMATOCRIT: 28.2 % — AB (ref 36.0–46.0)
Hemoglobin: 9.5 g/dL — ABNORMAL LOW (ref 12.0–15.0)
MCH: 28.4 pg (ref 26.0–34.0)
MCHC: 33.7 g/dL (ref 30.0–36.0)
MCV: 84.2 fL (ref 78.0–100.0)
PLATELETS: 268 10*3/uL (ref 150–400)
RBC: 3.35 MIL/uL — ABNORMAL LOW (ref 3.87–5.11)
RDW: 14.3 % (ref 11.5–15.5)
WBC: 7 10*3/uL (ref 4.0–10.5)

## 2014-09-14 LAB — HEMOGLOBIN A1C
Hgb A1c MFr Bld: 10.5 % — ABNORMAL HIGH (ref 4.8–5.6)
Mean Plasma Glucose: 255 mg/dL

## 2014-09-14 LAB — GLUCOSE, CAPILLARY
GLUCOSE-CAPILLARY: 313 mg/dL — AB (ref 70–99)
Glucose-Capillary: 91 mg/dL (ref 70–99)

## 2014-09-14 MED ORDER — NEPRO/CARBSTEADY PO LIQD: 237.0000 mL | ORAL | Status: DC | PRN

## 2014-09-14 MED ORDER — SODIUM CHLORIDE 0.9 % IV SOLN: 100.0000 mL | INTRAVENOUS | Status: DC | PRN

## 2014-09-14 MED ORDER — HEPARIN SODIUM (PORCINE) 1000 UNIT/ML DIALYSIS: 1000.0000 [IU] | INTRAMUSCULAR | Status: DC | PRN

## 2014-09-14 MED ORDER — DOXERCALCIFEROL 4 MCG/2ML IV SOLN
INTRAVENOUS | Status: AC
  Administered 2014-09-14: 2 ug via INTRAVENOUS
  Filled 2014-09-14: qty 2

## 2014-09-14 MED ORDER — AMOXICILLIN-POT CLAVULANATE 875-125 MG PO TABS: 1.0000 | ORAL_TABLET | Freq: Two times a day (BID) | ORAL | Status: DC

## 2014-09-14 MED ORDER — LIDOCAINE-PRILOCAINE 2.5-2.5 % EX CREA: 1.0000 "application " | TOPICAL_CREAM | CUTANEOUS | Status: DC | PRN

## 2014-09-14 MED ORDER — RENA-VITE PO TABS: 1.0000 | ORAL_TABLET | Freq: Every day | ORAL | Status: DC

## 2014-09-14 MED ORDER — SACCHAROMYCES BOULARDII 250 MG PO CAPS: 250.0000 mg | ORAL_CAPSULE | Freq: Two times a day (BID) | ORAL | Status: DC

## 2014-09-14 MED ORDER — INSULIN GLARGINE 100 UNIT/ML ~~LOC~~ SOLN: 12.0000 [IU] | Freq: Every day | SUBCUTANEOUS | Status: DC

## 2014-09-14 MED ORDER — FLUTICASONE PROPIONATE 50 MCG/ACT NA SUSP: 1.0000 | Freq: Every day | NASAL | Status: DC

## 2014-09-14 MED ORDER — HEPARIN SODIUM (PORCINE) 1000 UNIT/ML DIALYSIS: 2000.0000 [IU] | INTRAMUSCULAR | Status: DC | PRN

## 2014-09-14 MED ORDER — ALTEPLASE 2 MG IJ SOLR
2.0000 mg | Freq: Once | INTRAMUSCULAR | Status: DC | PRN
  Filled 2014-09-14: qty 2

## 2014-09-14 MED ORDER — PENTAFLUOROPROP-TETRAFLUOROETH EX AERO: 1.0000 "application " | INHALATION_SPRAY | CUTANEOUS | Status: DC | PRN

## 2014-09-14 MED ORDER — GUAIFENESIN ER 600 MG PO TB12: 600.0000 mg | ORAL_TABLET | Freq: Two times a day (BID) | ORAL | Status: DC

## 2014-09-14 MED ORDER — LISINOPRIL 20 MG PO TABS: 20.0000 mg | ORAL_TABLET | Freq: Every day | ORAL | Status: DC

## 2014-09-14 MED ORDER — LIDOCAINE HCL (PF) 1 % IJ SOLN: 5.0000 mL | INTRAMUSCULAR | Status: DC | PRN

## 2014-09-14 NOTE — Progress Notes (Signed)
Inpatient Diabetes Program Recommendations  AACE/ADA: New Consensus Statement on Inpatient Glycemic Control (2013)  Target Ranges:  Prepandial:   less than 140 mg/dL      Peak postprandial:   less than 180 mg/dL (1-2 hours)      Critically ill patients:  140 - 180 mg/dL   Consider adding Novolog meal coverage 2 units TID if eats at least 50% of meals.   Thank you  Raoul Pitch BSN, RN,CDE Inpatient Diabetes Coordinator [phone] (team pager)

## 2014-09-14 NOTE — Progress Notes (Signed)
Per dialysis, notified by patient that she was having pain in right hand were IV was placed.  Before attaching patient to fluid, unclamped IV. When patient was assess noted was IV clamped, but no alarm from pump to notify. Stopped fluids and taken IV out. Area was painful to touch. Ice placed on site. Sent text page to [facility] MD to notify. Patient taken to dialysis.

## 2014-09-14 NOTE — Progress Notes (Signed)
Subjective:  Seen on dialysis, occasional cough, less sputum, no dyspnea, good appetite; IV removed from right hand after infiltration earlier  Objective: Vital signs in last 24 hours: Temp:  [97.6 F (36.4 C)-98.4 F (36.9 C)] 98.2 F (36.8 C) (02/25 0732) Pulse Rate:  [80-94] 94 (02/25 0732) Resp:  [15-20] 15 (02/25 0732) BP: (129-157)/(72-94) 146/94 mmHg (02/25 0732) SpO2:  [94 %-97 %] 97 % (02/25 0522) Weight:  [52.9 kg (116 lb 10 oz)] 52.9 kg (116 lb 10 oz) (02/25 0732) Weight change:   Intake/Output from previous day: 02/24 0701 - 02/25 0700 In: 360 [P.O.:360] Out: 2 [Urine:1; Stool:1] Intake/Output this shift:   Lab Results:  Recent Labs  09/12/14 1200 09/14/14 0426  WBC 8.8 7.0  HGB 9.5* 9.5*  HCT 27.4* 28.2*  PLT 234 268   BMET:  Recent Labs  09/12/14 1200 09/14/14 0426  NA 127* 132*  K 4.7 3.8  CL 90* 90*  CO2 24 24  GLUCOSE 418* 322*  BUN 53* 30*  CREATININE 11.61* 8.70*  CALCIUM 8.4 9.1  ALBUMIN 3.0* 2.9*   No results for input(s): PTH in the last 72 hours. Iron Studies: No results for input(s): IRON, TIBC, TRANSFERRIN, FERRITIN in the last 72 hours.  Studies/Results: No results found.   Inpatient Medications: . amLODipine  10 mg Oral Daily  . aspirin EC  81 mg Oral Daily  . azithromycin  500 mg Intravenous Q24H  . budesonide (PULMICORT) nebulizer solution  0.25 mg Nebulization BID  . calcium acetate  667 mg Oral TID WC  . darbepoetin (ARANESP) injection - DIALYSIS  40 mcg Intravenous Q Tue-HD  . doxercalciferol      . doxercalciferol  2 mcg Intravenous Q T,Th,Sa-HD  . fluticasone  1 spray Each Nare Daily  . folic acid  1 mg Oral Daily  . heparin  5,000 Units Subcutaneous 3 times per day  . insulin aspart  0-5 Units Subcutaneous QHS  . insulin aspart  0-9 Units Subcutaneous TID WC  . insulin glargine  12 Units Subcutaneous Daily  . labetalol  300 mg Oral TID  . lisinopril  20 mg Oral Daily  . multivitamin  1 tablet Oral QHS  .  multivitamin with minerals  1 tablet Oral Daily  . piperacillin-tazobactam (ZOSYN)  IV  2.25 g Intravenous 3 times per day  . rosuvastatin  10 mg Oral Daily  . thiamine  100 mg Oral Daily   EXAM: General appearance: Alert, in no apparent distress Resp: CTA without rales, rhonchi, or wheezes Cardio: RRR without murmur or rub GI: + BS, soft and nontender Extremities: No edema Access: AVG @ LUA with BFR 425  Dialysis Orders: TTS @ AF 3:30 51.5 kg 2K/2Ca 425/A1.5 Heparin 2000 U AVG @ LUA Hectorol 2 mcg Aranesp 40 mcg on Tues No Venofer  Assessment/Plan: 1. RLL pneumonia - per CXR, afebrile, s/p IV Zosyn & Azithromycin. 2. ESRD - HD on TTS @ AF, K 3.8. HD today. 3. HTN/volume - BP 147/91, on Amlodipine 10 mg qd, Labetalol 300 mg tid, Lisinopril 20 mg qd; wt 52.9 kg with UF goal 2 L. 4. Anemia - Hgb 9.5 on Aranesp 40 mcg on Tues, recent Fe loading for T-sat 26%. 5. Metabolic bone disease - Ca 9.1 (10 corrected), P 5.2, iPTH 708; Hectorol 2 mcg, Phoslo with meals. 6. Nutrition - Alb 2.9, renal carb-mod diet, vitamin. 7. DM Type 2 - insulin per primary, last A1C 9.9.   LOS: 3 days   LYLES,CHARLES  09/14/2014,7:42 AM  Pt seen, examined and agree w A/P as above.  Kelly Splinter MD pager [phone]    cell [phone] 09/14/2014, 4:22 PM

## 2014-09-14 NOTE — Discharge Summary (Signed)
Physician Discharge Summary  [name] [id number] DOB: 12/06/1975 DOA: 09/11/2014  PCP: No primary care provider on file.  Admit date: 09/11/2014 Discharge date: 09/14/2014  Time spent: >30 minutes  Recommendations for Outpatient Follow-up:  1. Reassess BP and adjust medications as needed 2. Check CXR in 3 weeks to assure resolution of infiltrates 3. Close follow up of CBG's and further adjustment to hypoglycemic regimen  Discharge Diagnoses:  Active Problems:   HCAP (healthcare-associated pneumonia)   ESRD on hemodialysis   DM type 1 (diabetes mellitus, type 1), uncontrolled   History of diabetic retinopathy and nephropathy   Secondary hypoparathyroidism   Anemia of chronic kidney disease    HLD   Discharge Condition: stable and improved. Discharge home with instruction to follow with PCP in 10 days.  Diet recommendation: heart healthy diet and low carb diet  Filed Weights   09/12/14 1725 09/14/14 0732 09/14/14 1124  Weight: 52 kg (114 lb 10.2 oz) 52.9 kg (116 lb 10 oz) 50.4 kg (111 lb 1.8 oz)    History of present illness:  39 y.o. female presents with pneumonia. Patient states that she had been well until about February 12th. Patient states that she developed a cough at that time which was productive of yellow sputum. She states that she had some blood in it yesterday. Patient states that she had been also running fevers about 100.6F. She states states that she had no shortness of breath. She was noted to have a low pulsox however in the ED. 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 In addition she states that she has renal failure and is on HD 3x per week. She has had her flu shot and pneumonia shot up to date. She does not smoke and does not drink. She has no HIV risk factors noted. She is also a diabetic. She went to her PCP today and was noted to have an elevated WBC and was sent to the ED> In  the ED a CXR was done and this shows presence of a RLL infiltrate. Admitted for further tx of HCAP  Hospital Course:  1-healthcare associated pneumonia -Improved and w/o SOB or fever at discharge -WBCs within normal limits -Will discharge on augmentin to finalize antibiotics therapy -Started on flutter valve and mucinex -will follow with PCP as an outpatient will recommend repeat CXR in 3 weeks to assure resolution of infiltrates   2-type 1 diabetes mellitus with chronic nephropathy: uncontrolled -Patient Lantus has been adjusted for better sugar control -continue Sliding scale with humalog -Continue modified carbohydrate diet -A1c 10.5; needs close follow up and further regimen adjustment  3-essential hypertension: Continue Norvasc, labetalol (adjusted dose) and lisinopril -Prior to admission patient was on HCTZ but given the fact that she is on chronic hemodialysis this medication was discontinued. -advised to follow low sodium diet -volume to be managed by hemodialysis  4-chronic anemia: Continue Aranesp and IV iron as per renal discretion   5-hyperlipidemia: Continue Crestor  6-secondary hyperparathyroidism: Continue PhosLo and follow renal service rec's in outpatient setting   Procedures:  See below for x-ray reports   Inpatient Hemodialysis  Consultations:  Renal service   Discharge Exam: Filed Vitals:   09/14/14 1124  BP: 154/89  Pulse: 89  Temp: 98.2 F (36.8 C)  Resp: 20    General: Afebrile, denies chest pain or palpitations. Still with intermittent episodes of cough (essentially non-productive)  Cardiovascular: S1 and  S2, positive systolic ejection murmur, no rubs, no gallops  Respiratory: Scattered rhonchi, no crackles; no wheezing and with good O2 sat on RA  Abdomen: Soft, nontender, nondistended, positive bowel sounds, no guarding  Musculoskeletal: No lower extremity edema or cyanosis appreciated on exam.  Discharge Instructions   Discharge  Instructions    Diet - low sodium heart healthy    Complete by:  As directed      Discharge instructions    Complete by:  As directed   Keep yourself with adequate hydration Take medications as prescribed Please arrange hospital follow up with PCP in 10 days Follow a low sodium and low carbohydrates diet          Current Discharge Medication List    START taking these medications   Details  amoxicillin-clavulanate (AUGMENTIN) 875-125 MG per tablet Take 1 tablet by mouth every 12 (twelve) hours. Qty: 12 tablet, Refills: 0    guaiFENesin (MUCINEX) 600 MG 12 hr tablet Take 1 tablet (600 mg total) by mouth 2 (two) times daily. Qty: 30 tablet, Refills: 0    lisinopril (PRINIVIL,ZESTRIL) 20 MG tablet Take 1 tablet (20 mg total) by mouth daily. Qty: 30 tablet, Refills: 0    multivitamin (RENA-VIT) TABS tablet Take 1 tablet by mouth at bedtime. Qty: 30 tablet, Refills: 2    saccharomyces boulardii (FLORASTOR) 250 MG capsule Take 1 capsule (250 mg total) by mouth 2 (two) times daily. Qty: 60 capsule, Refills: 0      CONTINUE these medications which have CHANGED   Details  fluticasone (FLONASE) 50 MCG/ACT nasal spray Place 1 spray into both nostrils daily. Qty: 16 g, Refills: 2    insulin glargine (LANTUS) 100 UNIT/ML injection Inject 0.12 mLs (12 Units total) into the skin daily.      CONTINUE these medications which have NOT CHANGED   Details  amLODipine (NORVASC) 10 MG tablet Take 10 mg by mouth daily.    aspirin 81 MG EC tablet Take 81 mg by mouth daily. Swallow whole.    calcium acetate (PHOSLO) 667 MG capsule Take 667 mg by mouth 3 (three) times daily with meals.    cinacalcet (SENSIPAR) 30 MG tablet Take 30 mg by mouth daily.    insulin lispro (HUMALOG) 100 UNIT/ML injection Inject 5-15 Units into the skin 3 (three) times daily before meals. 1 units for ever 20 gms of carbs    labetalol (NORMODYNE) 300 MG tablet Take 300 mg by mouth 3 (three) times daily.     rosuvastatin (CRESTOR) 10 MG tablet Take 10 mg by mouth daily.       No Known Allergies   The results of significant diagnostics from this hospitalization (including imaging, microbiology, ancillary and laboratory) are listed below for reference.    Significant Diagnostic Studies: Dg Chest 2 View  09/11/2014   CLINICAL DATA:  Cough since 09/01/2014. Coughing up blood today. Sent from primary care physician to emergency department for evaluation.  EXAM: CHEST  2 VIEW  COMPARISON:  None.  FINDINGS: Focal airspace disease in the right lower lung most likely representing acute pneumonia. Followup after resolution of acute process recommended to exclude underlying obstructing lesion. Possible mild perihilar infiltration on the left. Heart size and pulmonary vascularity are normal. No blunting of costophrenic angles. No pneumothorax. Mediastinal contours appear intact. Surgical clips in the left axilla.  IMPRESSION: Airspace consolidation in the right lower lung most likely representing pneumonia.   Electronically Signed   By: Oren Beckmann.D.  On: 09/11/2014 19:15    Microbiology: Recent Results (from the past 240 hour(s))  Culture, blood (routine x 2) Call MD if unable to obtain prior to antibiotics being given     Status: None (Preliminary result)   Collection Time: 09/12/14  1:30 AM  Result Value Ref Range Status   Specimen Description BLOOD RIGHT HAND  Final   Special Requests BOTTLES DRAWN AEROBIC AND ANAEROBIC 5CC  Final   Culture   Final           BLOOD CULTURE RECEIVED NO GROWTH TO DATE CULTURE WILL BE HELD FOR 5 DAYS BEFORE ISSUING A FINAL NEGATIVE REPORT Performed at [facility]    Report Status PENDING  Incomplete  Culture, blood (routine x 2) Call MD if unable to obtain prior to antibiotics being given     Status: None (Preliminary result)   Collection Time: 09/12/14  1:45 AM  Result Value Ref Range Status   Specimen Description BLOOD RIGHT ARM  Final   Special  Requests BOTTLES DRAWN AEROBIC AND ANAEROBIC 5CC  Final   Culture   Final           BLOOD CULTURE RECEIVED NO GROWTH TO DATE CULTURE WILL BE HELD FOR 5 DAYS BEFORE ISSUING A FINAL NEGATIVE REPORT Performed at [facility]    Report Status PENDING  Incomplete     Labs: Basic Metabolic Panel:  Recent Labs Lab 09/11/14 1959 09/12/14 0130 09/12/14 1200 09/14/14 0426  NA 131* 134* 127* 132*  K 3.6 3.6 4.7 3.8  CL 95* 94* 90* 90*  CO2 25 26 24 24   GLUCOSE 66* 122* 418* 322*  BUN 47* 44* 53* 30*  CREATININE 10.19* 10.94* 11.61* 8.70*  CALCIUM 8.6 8.9 8.4 9.1  PHOS  --   --  5.7* 5.2*   Liver Function Tests:  Recent Labs Lab 09/12/14 0130 09/12/14 1200 09/14/14 0426  AST 12  --   --   ALT 11  --   --   ALKPHOS 98  --   --   BILITOT 0.7  --   --   PROT 6.6  --   --   ALBUMIN 3.4* 3.0* 2.9*   CBC:  Recent Labs Lab 09/11/14 1959 09/12/14 0130 09/12/14 1200 09/14/14 0426  WBC 10.5 9.5 8.8 7.0  NEUTROABS 8.0*  --   --   --   HGB 10.6* 10.1* 9.5* 9.5*  HCT 30.4* 29.1* 27.4* 28.2*  MCV 83.1 81.7 82.3 84.2  PLT 265 257 234 268   CBG:  Recent Labs Lab 09/13/14 1121 09/13/14 1633 09/13/14 2058 09/14/14 0611 09/14/14 1157  GLUCAP 221* 171* 153* 313* 91    Signed:  Barton Dubois  [facility] 09/14/2014, 1:52 PM

## 2014-09-15 ENCOUNTER — Encounter (HOSPITAL_COMMUNITY): Payer: Self-pay | Admitting: Surgery

## 2014-09-15 DIAGNOSIS — N2581 Secondary hyperparathyroidism of renal origin: Secondary | ICD-10-CM | POA: Insufficient documentation

## 2014-09-15 DIAGNOSIS — I10 Essential (primary) hypertension: Secondary | ICD-10-CM | POA: Insufficient documentation

## 2014-09-18 LAB — CULTURE, BLOOD (ROUTINE X 2)
CULTURE: NO GROWTH
Culture: NO GROWTH

## 2014-10-11 ENCOUNTER — Ambulatory Visit: Payer: Medicare Other | Admitting: Neurology

## 2014-10-11 ENCOUNTER — Encounter: Payer: Self-pay | Admitting: *Deleted

## 2014-10-12 ENCOUNTER — Encounter: Payer: Self-pay | Admitting: Neurology

## 2014-12-25 ENCOUNTER — Emergency Department (HOSPITAL_BASED_OUTPATIENT_CLINIC_OR_DEPARTMENT_OTHER): Payer: Medicare Other

## 2014-12-25 ENCOUNTER — Telehealth (HOSPITAL_BASED_OUTPATIENT_CLINIC_OR_DEPARTMENT_OTHER): Payer: Self-pay

## 2014-12-25 ENCOUNTER — Emergency Department (HOSPITAL_BASED_OUTPATIENT_CLINIC_OR_DEPARTMENT_OTHER)
Admission: EM | Admit: 2014-12-25 | Discharge: 2014-12-25 | Disposition: A | Discharge status: Home / Self Care | Payer: Medicare Other | Attending: Emergency Medicine | Admitting: Emergency Medicine

## 2014-12-25 ENCOUNTER — Encounter (HOSPITAL_BASED_OUTPATIENT_CLINIC_OR_DEPARTMENT_OTHER): Payer: Self-pay | Admitting: *Deleted

## 2014-12-25 DIAGNOSIS — Z992 Dependence on renal dialysis: Secondary | ICD-10-CM | POA: Diagnosis not present

## 2014-12-25 DIAGNOSIS — Z794 Long term (current) use of insulin: Secondary | ICD-10-CM | POA: Insufficient documentation

## 2014-12-25 DIAGNOSIS — Y998 Other external cause status: Secondary | ICD-10-CM | POA: Insufficient documentation

## 2014-12-25 DIAGNOSIS — M1990 Unspecified osteoarthritis, unspecified site: Secondary | ICD-10-CM | POA: Diagnosis not present

## 2014-12-25 DIAGNOSIS — S0990XA Unspecified injury of head, initial encounter: Secondary | ICD-10-CM | POA: Diagnosis not present

## 2014-12-25 DIAGNOSIS — R188 Other ascites: Secondary | ICD-10-CM | POA: Insufficient documentation

## 2014-12-25 DIAGNOSIS — I120 Hypertensive chronic kidney disease with stage 5 chronic kidney disease or end stage renal disease: Secondary | ICD-10-CM | POA: Diagnosis not present

## 2014-12-25 DIAGNOSIS — Z862 Personal history of diseases of the blood and blood-forming organs and certain disorders involving the immune mechanism: Secondary | ICD-10-CM | POA: Insufficient documentation

## 2014-12-25 DIAGNOSIS — Z8719 Personal history of other diseases of the digestive system: Secondary | ICD-10-CM | POA: Insufficient documentation

## 2014-12-25 DIAGNOSIS — E10319 Type 1 diabetes mellitus with unspecified diabetic retinopathy without macular edema: Secondary | ICD-10-CM | POA: Diagnosis not present

## 2014-12-25 DIAGNOSIS — R52 Pain, unspecified: Secondary | ICD-10-CM

## 2014-12-25 DIAGNOSIS — E785 Hyperlipidemia, unspecified: Secondary | ICD-10-CM | POA: Diagnosis not present

## 2014-12-25 DIAGNOSIS — S8991XA Unspecified injury of right lower leg, initial encounter: Secondary | ICD-10-CM | POA: Diagnosis not present

## 2014-12-25 DIAGNOSIS — N186 End stage renal disease: Secondary | ICD-10-CM | POA: Insufficient documentation

## 2014-12-25 DIAGNOSIS — Z79899 Other long term (current) drug therapy: Secondary | ICD-10-CM | POA: Insufficient documentation

## 2014-12-25 DIAGNOSIS — Z7982 Long term (current) use of aspirin: Secondary | ICD-10-CM | POA: Diagnosis not present

## 2014-12-25 DIAGNOSIS — W06XXXA Fall from bed, initial encounter: Secondary | ICD-10-CM | POA: Insufficient documentation

## 2014-12-25 DIAGNOSIS — Y9289 Other specified places as the place of occurrence of the external cause: Secondary | ICD-10-CM | POA: Insufficient documentation

## 2014-12-25 DIAGNOSIS — K8020 Calculus of gallbladder without cholecystitis without obstruction: Secondary | ICD-10-CM | POA: Insufficient documentation

## 2014-12-25 DIAGNOSIS — Y9389 Activity, other specified: Secondary | ICD-10-CM | POA: Diagnosis not present

## 2014-12-25 MED ORDER — METHOCARBAMOL 500 MG PO TABS: 500.0000 mg | ORAL_TABLET | Freq: Two times a day (BID) | ORAL | Status: DC

## 2014-12-25 MED ORDER — OXYCODONE-ACETAMINOPHEN 5-325 MG PO TABS: 1.0000 | ORAL_TABLET | Freq: Four times a day (QID) | ORAL | Status: DC | PRN

## 2014-12-25 MED ORDER — OXYCODONE-ACETAMINOPHEN 5-325 MG PO TABS
2.0000 | ORAL_TABLET | Freq: Once | ORAL | Status: AC
  Administered 2014-12-25: 2 via ORAL
  Filled 2014-12-25: qty 2

## 2014-12-25 NOTE — ED Notes (Signed)
Pt in xray

## 2014-12-25 NOTE — ED Notes (Signed)
Pt states she rolled out of the bed. States she is not sure how she landed but states she was having pain when trying to put weight on her right knee. Swelling noted to right knee on arrival. Pt arrived via [facility]. Pt also c/o of lower back hurting. Denies any loc. Pt is a dialysis patient on Tues, Thurs, and Sat. Shunt to left upper arm.

## 2014-12-25 NOTE — ED Provider Notes (Signed)
CSN: [id number]     Arrival date & time 12/25/14  0232 History   First MD Initiated Contact with Patient 12/25/14 [phone]     Chief Complaint  Patient presents with  . Fall     (Consider location/radiation/quality/duration/timing/severity/associated sxs/prior Treatment) Patient is a 39 y.o. female presenting with fall. The history is provided by the patient.  Fall This is a new problem. The current episode started 6 to 12 hours ago. The problem occurs constantly. The problem has not changed since onset.Pertinent negatives include no chest pain, no abdominal pain, no headaches and no shortness of breath. Nothing aggravates the symptoms. Nothing relieves the symptoms. She has tried nothing for the symptoms. The treatment provided no relief.  not sure how she fell but left knee is swollen on aspirin and dialysis t t s.  No missed treatments  Past Medical History  Diagnosis Date  . ESRD on hemodialysis     ESRD due to DM type I, age of onset DM 1 was age 9.  Went on dialysis in March 2012.  Gets HD now at [facility] on a TTS schedule.    . Diabetes mellitus   . GERD (gastroesophageal reflux disease)   . Arthritis   . History of blood transfusion   . Peripheral vascular disease   . Hypertension   . Hyperlipidemia   . Anemia   . ESRD on hemodialysis 09/12/2014    ESRD due to DM type 1.  Started HD in 2012 at [facility].  Gets HD there on a TTS schedule.    . DM type 1 (diabetes mellitus, type 1) 09/12/2014    Age of onset for DM type 1 was age 42.     . Diabetic retinopathy     Hx of laser Rx's   Past Surgical History  Procedure Laterality Date  . Arteriovenous graft placement    . Cesarean section    . Eye surgery      LAzer  . Artery repair Left 12/10/2012    Procedure: BRACHIAL ARTERY REPAIR;  Surgeon: Angelia Mould, MD;  Location[facility] OR;  Service: Vascular;  Laterality: Left;  Exploration Left Brachial Artery for AVF  . Shuntogram Left 11/30/2013    Procedure: SHUNTOGRAM;   Surgeon: Serafina Mitchell, MD;  Location: [facility] CATH LAB;  Service: Cardiovascular;  Laterality: Left;   Family History  Problem Relation Age of Onset  . Cancer Mother     breast  . Hypertension Father    History  Substance Use Topics  . Smoking status: Never Smoker   . Smokeless tobacco: Not on file  . Alcohol Use: No   OB History    Gravida Para Term Preterm AB TAB SAB Ectopic Multiple Living   0 0 0 0 0 0 0 0       Review of Systems  Constitutional: Negative for fever.  Eyes: Negative for photophobia.  Respiratory: Negative for shortness of breath.   Cardiovascular: Negative for chest pain.  Gastrointestinal: Negative for vomiting and abdominal pain.  Musculoskeletal: Positive for joint swelling and arthralgias. Negative for neck pain and neck stiffness.  Neurological: Negative for weakness, numbness and headaches.  All other systems reviewed and are negative.     Allergies  Pollen extract  Home Medications   Prior to Admission medications   Medication Sig Start Date End Date Taking? Authorizing Provider  albuterol (PROVENTIL HFA;VENTOLIN HFA) 108 (90 BASE) MCG/ACT inhaler Inhale 2 puffs into the lungs every 6 (six) hours  as needed for wheezing or shortness of breath.     Historical Provider, MD  amLODipine (NORVASC) 10 MG tablet Take 10 mg by mouth daily.     Historical Provider, MD  amLODipine (NORVASC) 10 MG tablet Take 10 mg by mouth daily.    Historical Provider, MD  amoxicillin-clavulanate (AUGMENTIN) 875-125 MG per tablet Take 1 tablet by mouth every 12 (twelve) hours. 09/14/14   Barton Dubois, MD  aspirin 325 MG tablet Take 1 tablet (325 mg total) by mouth daily. 03/31/14   Maryann Mikhail, DO  aspirin 81 MG EC tablet Take 81 mg by mouth daily. Swallow whole.    Historical Provider, MD  calcium acetate (PHOSLO) 667 MG capsule Take 667 mg by mouth 3 (three) times daily with meals.      Historical Provider, MD  calcium acetate (PHOSLO) 667 MG capsule Take 667 mg by  mouth 3 (three) times daily with meals.    Historical Provider, MD  cinacalcet (SENSIPAR) 30 MG tablet Take 30 mg by mouth daily.    Historical Provider, MD  fluticasone (FLONASE) 50 MCG/ACT nasal spray Place 1 spray into both nostrils daily. 09/14/14   Barton Dubois, MD  GLUCAGON EMERGENCY 1 MG injection Inject 1 mg into the muscle once as needed (for severe low blood sugar).  11/22/12   Historical Provider, MD  glucose 4 GM chewable tablet Chew 4-16 g by mouth as needed for low blood sugar.     Historical Provider, MD  guaiFENesin (MUCINEX) 600 MG 12 hr tablet Take 1 tablet (600 mg total) by mouth 2 (two) times daily. 09/14/14   Barton Dubois, MD  HUMALOG KWIKPEN 100 UNIT/ML KiwkPen  04/20/14   Historical Provider, MD  insulin glargine (LANTUS) 100 UNIT/ML injection Inject 10 Units into the skin at bedtime.     Historical Provider, MD  insulin glargine (LANTUS) 100 UNIT/ML injection Inject 0.12 mLs (12 Units total) into the skin daily. 09/14/14   Barton Dubois, MD  insulin lispro (HUMALOG) 100 UNIT/ML injection Inject 5-15 Units into the skin 3 (three) times daily before meals. 1 units for ever 20 gms of carbs    Historical Provider, MD  labetalol (NORMODYNE) 300 MG tablet Take 600 mg by mouth 3 (three) times daily.     Historical Provider, MD  labetalol (NORMODYNE) 300 MG tablet Take 300 mg by mouth 3 (three) times daily.    Historical Provider, MD  LANTUS SOLOSTAR 100 UNIT/ML Solostar Pen Inject 100 Units into the skin as directed. 10/06/14   Historical Provider, MD  lidocaine-prilocaine (EMLA) cream Apply 1 application topically as needed. Prior to dialysis 03/16/14   Historical Provider, MD  lisinopril (PRINIVIL,ZESTRIL) 20 MG tablet Take 1 tablet (20 mg total) by mouth daily. 09/14/14   Barton Dubois, MD  lisinopril-hydrochlorothiazide (PRINZIDE,ZESTORETIC) 20-25 MG per tablet Take 1 tablet by mouth daily. 10/06/14   Historical Provider, MD  medroxyPROGESTERone (DEPO-PROVERA) 150 MG/ML injection Inject  150 mg into the muscle every 3 (three) months.      Historical Provider, MD  multivitamin (RENA-VIT) TABS tablet Take 1 tablet by mouth at bedtime. 09/14/14   Barton Dubois, MD  Pediatric Multiple Vit-C-FA (FLINSTONES GUMMIES OMEGA-3 DHA) CHEW Chew 1 tablet by mouth daily.     Historical Provider, MD  rosuvastatin (CRESTOR) 10 MG tablet Take 10 mg by mouth daily.     Historical Provider, MD  rosuvastatin (CRESTOR) 10 MG tablet Take 10 mg by mouth daily.    Historical Provider, MD  saccharomyces boulardii [facility])  250 MG capsule Take 1 capsule (250 mg total) by mouth 2 (two) times daily. 09/14/14   Barton Dubois, MD  SENSIPAR 30 MG tablet Take 30 mg by mouth daily with breakfast.  05/05/14   Historical Provider, MD  valsartan (DIOVAN) 160 MG tablet Take 160 mg by mouth daily. 10/05/14   Historical Provider, MD   BP 128/93 mmHg  Pulse 86  Temp(Src) 98.3 F (36.8 C) (Oral)  Resp 20  Ht 5\' 3"  (1.6 m)  Wt 120 lb (54.432 kg)  BMI 21.26 kg/m2  SpO2 96%  LMP 12/13/2014 (Approximate) Physical Exam  Constitutional: She is oriented to person, place, and time. She appears well-developed and well-nourished. No distress.  HENT:  Head: Normocephalic and atraumatic. Head is without raccoon's eyes and without Battle's sign.  Right Ear: No hemotympanum.  Left Ear: No hemotympanum.  Mouth/Throat: Oropharynx is clear and moist.  Eyes: Conjunctivae and EOM are normal. Pupils are equal, round, and reactive to light.  Neck: Normal range of motion. Neck supple.  No c t or l spine crepitance step offs or tenderness  Cardiovascular: Normal rate, regular rhythm and intact distal pulses.   Pulmonary/Chest: Effort normal and breath sounds normal. She has no wheezes. She has no rales.  Abdominal: Soft. Bowel sounds are normal. There is no tenderness. There is no rebound and no guarding.  Musculoskeletal:       Right knee: She exhibits swelling and effusion. She exhibits no ecchymosis, no deformity, no laceration,  no erythema, normal alignment, no LCL laxity, normal patellar mobility, no bony tenderness, normal meniscus and no MCL laxity. No medial joint line, no lateral joint line, no MCL, no LCL and no patellar tendon tenderness noted.  Negative anterior and posterior drawer tests. All compartments of the RLE are soft no patella alta or b[place].    Neurological: She is alert and oriented to person, place, and time. She has normal reflexes.  Skin: Skin is warm and dry.  Psychiatric: She has a normal mood and affect.    ED Course  Procedures (including critical care time) Labs Review Labs Reviewed - No data to display  Imaging Review Dg Knee Complete 4 Views Right  12/25/2014   CLINICAL DATA:  Fall out of bed landing on right knee. Now with pain. Inability to bear weight.  EXAM: RIGHT KNEE - COMPLETE 4+ VIEW  COMPARISON:  None.  FINDINGS: No fracture or dislocation. The alignment and joint spaces are maintained. There is a large joint effusion. Soft tissue edema noted.  IMPRESSION: 1. No fracture or dislocation. 2. Large joint effusion, can be seen with internal derangement.   Electronically Signed   By: Jeb Levering M.D.   On: 12/25/2014 04:09     EKG Interpretation None      MDM   Final diagnoses:  Pain   Traumatic effusion on aspirin.  Knee immobilizer pain medication and follow up with orthopedics for ongoing testing and care patient verbalizes understanding and agrees to follow up    Tilman Mcclaren, MD 12/25/14 [phone]

## 2014-12-25 NOTE — ED Notes (Signed)
Pt mentioned to EDP hitting head when she fell, orders received, acuity changed.

## 2015-02-28 ENCOUNTER — Other Ambulatory Visit: Payer: Self-pay | Admitting: Orthopedic Surgery

## 2015-02-28 DIAGNOSIS — M25469 Effusion, unspecified knee: Secondary | ICD-10-CM

## 2015-03-12 ENCOUNTER — Ambulatory Visit
Admission: RE | Admit: 2015-03-12 | Discharge: 2015-03-12 | Disposition: A | Discharge status: Home / Self Care | Payer: Medicare Other | Source: Ambulatory Visit | Attending: Orthopedic Surgery | Admitting: Orthopedic Surgery

## 2015-03-12 DIAGNOSIS — M25469 Effusion, unspecified knee: Secondary | ICD-10-CM

## 2015-12-13 ENCOUNTER — Telehealth: Payer: Self-pay | Admitting: Obstetrics and Gynecology

## 2015-12-13 NOTE — Telephone Encounter (Signed)
PT CAME FOR ANNUAL EXAM LAST MONTH AND WANTED TO GET RESULTS.

## 2016-01-10 NOTE — Telephone Encounter (Signed)
Wrong person we have never seen this pt

## 2016-05-27 ENCOUNTER — Encounter (HOSPITAL_BASED_OUTPATIENT_CLINIC_OR_DEPARTMENT_OTHER): Payer: Self-pay | Admitting: Emergency Medicine

## 2016-05-27 ENCOUNTER — Emergency Department (HOSPITAL_BASED_OUTPATIENT_CLINIC_OR_DEPARTMENT_OTHER)
Admission: EM | Admit: 2016-05-27 | Discharge: 2016-05-27 | Disposition: A | Discharge status: Home / Self Care | Payer: Medicare Other | Attending: Emergency Medicine | Admitting: Emergency Medicine

## 2016-05-27 DIAGNOSIS — Y69 Unspecified misadventure during surgical and medical care: Secondary | ICD-10-CM | POA: Insufficient documentation

## 2016-05-27 DIAGNOSIS — T82838A Hemorrhage of vascular prosthetic devices, implants and grafts, initial encounter: Secondary | ICD-10-CM

## 2016-05-27 DIAGNOSIS — Z7982 Long term (current) use of aspirin: Secondary | ICD-10-CM | POA: Insufficient documentation

## 2016-05-27 DIAGNOSIS — Z992 Dependence on renal dialysis: Secondary | ICD-10-CM | POA: Insufficient documentation

## 2016-05-27 DIAGNOSIS — Z7984 Long term (current) use of oral hypoglycemic drugs: Secondary | ICD-10-CM | POA: Diagnosis not present

## 2016-05-27 DIAGNOSIS — E1022 Type 1 diabetes mellitus with diabetic chronic kidney disease: Secondary | ICD-10-CM | POA: Diagnosis not present

## 2016-05-27 DIAGNOSIS — I120 Hypertensive chronic kidney disease with stage 5 chronic kidney disease or end stage renal disease: Secondary | ICD-10-CM | POA: Diagnosis not present

## 2016-05-27 DIAGNOSIS — N186 End stage renal disease: Secondary | ICD-10-CM | POA: Diagnosis not present

## 2016-05-27 NOTE — ED Notes (Signed)
Multiple staff to room, dressing removed from fistula and pressure applied to site of bleeding. Dr. Laneta Simmers to bedside.

## 2016-05-27 NOTE — Discharge Instructions (Signed)
If your fistula site starts bleeding again, please apply direct pressure with a single finger and gauze and return immediately to the emergency department if bleeding does not stop after 5 minutes of pressure.

## 2016-05-27 NOTE — ED Provider Notes (Signed)
[place] DEPT [facility] Provider Note   CSN: [id number] Arrival date & time: 05/27/16  1408     History   Chief Complaint Chief Complaint  Patient presents with  . Vascular Access Problem    HPI [name] is a 40 y.o. female.  The history is provided by the patient.  Illness  This is a new problem. The current episode started 1 to 2 hours ago. The problem occurs constantly. The problem has not changed since onset.Associated symptoms comments: Bleeding from left arm fistula site. Nothing aggravates the symptoms. Nothing relieves the symptoms. Treatments tried: direct pressure. The treatment provided no relief.    Past Medical History:  Diagnosis Date  . Anemia   . Arthritis   . Diabetes mellitus   . Diabetic retinopathy ([place])    Hx of laser Rx's  . DM type 1 (diabetes mellitus, type 1) ([place]) 09/12/2014   Age of onset for DM type 1 was age 83.     Marland Kitchen ESRD on hemodialysis ([place])    ESRD due to DM type I, age of onset DM 1 was age 11.  Went on dialysis in March 2012.  Gets HD now at [facility] on a TTS schedule.    Marland Kitchen ESRD on hemodialysis ([place]) 09/12/2014   ESRD due to DM type 1.  Started HD in 2012 at [facility].  Gets HD there on a TTS schedule.    Marland Kitchen GERD (gastroesophageal reflux disease)   . History of blood transfusion   . Hyperlipidemia   . Hypertension   . Peripheral vascular disease [facility])     Patient Active Problem List   Diagnosis Date Noted  . Hyperparathyroidism, secondary renal ([place])   . Benign essential HTN   . Pneumonia 09/12/2014  . CAP (community acquired pneumonia) 09/12/2014  . ESRD on hemodialysis ([place]) 09/12/2014  . DM type 1 (diabetes mellitus, type 1) ([place]) 09/12/2014  . History of diabetic retinopathy 09/12/2014  . HCAP (healthcare-associated pneumonia) 09/11/2014  . Cerebral infarction due to unspecified mechanism 05/22/2014  . Hypertensive emergency 05/22/2014  . Essential hypertension, benign 05/22/2014  . Type 2 diabetes mellitus  with other circulatory complications 38/04/1750  . HLD (hyperlipidemia) 05/22/2014  . CVA (cerebral infarction) 03/29/2014  . Facial droop 03/29/2014  . Mechanical complication of other vascular device, implant, and graft 01/12/2013  . Other complications due to renal dialysis device, implant, and graft 12/08/2012  . ESRD on hemodialysis ([place]) 04/18/2011    Past Surgical History:  Procedure Laterality Date  . ARTERIOVENOUS GRAFT PLACEMENT    . ARTERY REPAIR Left 12/10/2012   Procedure: BRACHIAL ARTERY REPAIR;  Surgeon: Angelia Mould, MD;  Location: [facility] OR;  Service: Vascular;  Laterality: Left;  Exploration Left Brachial Artery for AVF  . CESAREAN SECTION    . EYE SURGERY     LAzer  . SHUNTOGRAM Left 11/30/2013   Procedure: SHUNTOGRAM;  Surgeon: Serafina Mitchell, MD;  Location: [facility] CATH LAB;  Service: Cardiovascular;  Laterality: Left;    OB History    Gravida Para Term Preterm AB Living   0 0 0 0 0     SAB TAB Ectopic Multiple Live Births   0 0 0           Home Medications    Prior to Admission medications   Medication Sig Start Date End Date Taking? Authorizing Provider  albuterol (PROVENTIL HFA;VENTOLIN HFA) 108 (90 BASE) MCG/ACT inhaler Inhale 2 puffs into the lungs every 6 (six) hours  as needed for wheezing or shortness of breath.     Historical Provider, MD  amLODipine (NORVASC) 10 MG tablet Take 10 mg by mouth daily.     Historical Provider, MD  amLODipine (NORVASC) 10 MG tablet Take 10 mg by mouth daily.    Historical Provider, MD  amoxicillin-clavulanate (AUGMENTIN) 875-125 MG per tablet Take 1 tablet by mouth every 12 (twelve) hours. 09/14/14   Barton Dubois, MD  aspirin 325 MG tablet Take 1 tablet (325 mg total) by mouth daily. 03/31/14   Maryann Mikhail, DO  aspirin 81 MG EC tablet Take 81 mg by mouth daily. Swallow whole.    Historical Provider, MD  calcium acetate (PHOSLO) 667 MG capsule Take 667 mg by mouth 3 (three) times daily with meals.      Historical  Provider, MD  calcium acetate (PHOSLO) 667 MG capsule Take 667 mg by mouth 3 (three) times daily with meals.    Historical Provider, MD  cinacalcet (SENSIPAR) 30 MG tablet Take 30 mg by mouth daily.    Historical Provider, MD  fluticasone (FLONASE) 50 MCG/ACT nasal spray Place 1 spray into both nostrils daily. 09/14/14   Barton Dubois, MD  GLUCAGON EMERGENCY 1 MG injection Inject 1 mg into the muscle once as needed (for severe low blood sugar).  11/22/12   Historical Provider, MD  glucose 4 GM chewable tablet Chew 4-16 g by mouth as needed for low blood sugar.     Historical Provider, MD  guaiFENesin (MUCINEX) 600 MG 12 hr tablet Take 1 tablet (600 mg total) by mouth 2 (two) times daily. 09/14/14   Barton Dubois, MD  HUMALOG KWIKPEN 100 UNIT/ML KiwkPen  04/20/14   Historical Provider, MD  insulin glargine (LANTUS) 100 UNIT/ML injection Inject 10 Units into the skin at bedtime.     Historical Provider, MD  insulin glargine (LANTUS) 100 UNIT/ML injection Inject 0.12 mLs (12 Units total) into the skin daily. 09/14/14   Barton Dubois, MD  insulin lispro (HUMALOG) 100 UNIT/ML injection Inject 5-15 Units into the skin 3 (three) times daily before meals. 1 units for ever 20 gms of carbs    Historical Provider, MD  labetalol (NORMODYNE) 300 MG tablet Take 600 mg by mouth 3 (three) times daily.     Historical Provider, MD  labetalol (NORMODYNE) 300 MG tablet Take 300 mg by mouth 3 (three) times daily.    Historical Provider, MD  LANTUS SOLOSTAR 100 UNIT/ML Solostar Pen Inject 100 Units into the skin as directed. 10/06/14   Historical Provider, MD  lidocaine-prilocaine (EMLA) cream Apply 1 application topically as needed. Prior to dialysis 03/16/14   Historical Provider, MD  lisinopril (PRINIVIL,ZESTRIL) 20 MG tablet Take 1 tablet (20 mg total) by mouth daily. 09/14/14   Barton Dubois, MD  lisinopril-hydrochlorothiazide (PRINZIDE,ZESTORETIC) 20-25 MG per tablet Take 1 tablet by mouth daily. 10/06/14   Historical  Provider, MD  medroxyPROGESTERone (DEPO-PROVERA) 150 MG/ML injection Inject 150 mg into the muscle every 3 (three) months.      Historical Provider, MD  methocarbamol (ROBAXIN) 500 MG tablet Take 1 tablet (500 mg total) by mouth 2 (two) times daily. 12/25/14   April Palumbo, MD  multivitamin (RENA-VIT) TABS tablet Take 1 tablet by mouth at bedtime. 09/14/14   Barton Dubois, MD  oxyCODONE-acetaminophen (PERCOCET) 5-325 MG per tablet Take 1 tablet by mouth every 6 (six) hours as needed. 12/25/14   April Palumbo, MD  Pediatric Multiple Vit-C-FA (FLINSTONES GUMMIES OMEGA-3 DHA) CHEW Chew 1 tablet by mouth daily.  Historical Provider, MD  rosuvastatin (CRESTOR) 10 MG tablet Take 10 mg by mouth daily.     Historical Provider, MD  rosuvastatin (CRESTOR) 10 MG tablet Take 10 mg by mouth daily.    Historical Provider, MD  saccharomyces boulardii (FLORASTOR) 250 MG capsule Take 1 capsule (250 mg total) by mouth 2 (two) times daily. 09/14/14   Barton Dubois, MD  SENSIPAR 30 MG tablet Take 30 mg by mouth daily with breakfast.  05/05/14   Historical Provider, MD  valsartan (DIOVAN) 160 MG tablet Take 160 mg by mouth daily. 10/05/14   Historical Provider, MD    Family History Family History  Problem Relation Age of Onset  . Cancer Mother     breast  . Hypertension Father     Social History Social History  Substance Use Topics  . Smoking status: Never Smoker  . Smokeless tobacco: Not on file  . Alcohol use No     Allergies   Pollen extract   Review of Systems Review of Systems  All other systems reviewed and are negative.    Physical Exam Updated Vital Signs BP (!) 198/104   Pulse 85   Temp 97.9 F (36.6 C)   Resp 18   Wt 112 lb 7 oz (51 kg)   SpO2 100%   BMI 19.92 kg/m   Physical Exam  Constitutional: She is oriented to person, place, and time. She appears well-developed and well-nourished. No distress.  HENT:  Head: Normocephalic.  Nose: Nose normal.  Eyes: Conjunctivae are  normal.  Neck: Neck supple. No tracheal deviation present.  Cardiovascular: Normal rate and regular rhythm.   Pulmonary/Chest: Effort normal. No respiratory distress.  Abdominal: Soft. She exhibits no distension.  Neurological: She is alert and oriented to person, place, and time.  Skin: Skin is warm and dry. Capillary refill takes less than 2 seconds.  Left arm fistula with palpable thrill and oozing blood from recently accessed site adjacent to scar. Smaller volume of blood oozing from more medial site  Psychiatric: She has a normal mood and affect.     ED Treatments / Results  Labs (all labs ordered are listed, but only abnormal results are displayed) Labs Reviewed - No data to display  EKG  EKG Interpretation None       Radiology No results found.  Procedures Cauterization Date/Time: 05/27/2016 2:31 PM Performed by: Leo Grosser Authorized by: Leo Grosser  Consent: Verbal consent obtained. Risks and benefits: risks, benefits and alternatives were discussed Consent given by: patient Patient understanding: patient states understanding of the procedure being performed Required items: required blood products, implants, devices, and special equipment available Patient identity confirmed: verbally with patient, arm band, provided demographic data and hospital-assigned identification number Local anesthesia used: no  Anesthesia: Local anesthesia used: no  Sedation: Patient sedated: no Patient tolerance: Patient tolerated the procedure well with no immediate complications Comments: Local skin coverage with dermabond achieved hemostasis    (including critical care time)  Medications Ordered in ED Medications - No data to display   Initial Impression / Assessment and Plan / ED Course  I have reviewed the triage vital signs and the nursing notes.  Pertinent labs & imaging results that were available during my care of the patient were reviewed by me and considered  in my medical decision making (see chart for details).  Clinical Course     40 y.o. female presents with Excessive bleeding from left arm fistula where she had dialysis performed earlier today. When  the site was D accessed she had ongoing bleeding which was unable to be controlled by direct pressure and bandaging at the dialysis center so she was sent here for further evaluation. While evaluating the patient I was able to localize the bleeding to 2 small areas that were both amenable to direct point pressure. The bleeding was controlled by pulling the skin across the skin defect which was then repaired by Dermabond achieving good hemostasis and patient continued to have a good palpable thrill after the procedure with no apparent complications. Plan to follow up with PCP as needed and return precautions discussed for worsening or new concerning symptoms.   Final Clinical Impressions(s) / ED Diagnoses   Final diagnoses:  Hemorrhage of arteriovenous fistula, initial encounter [facility])    New Prescriptions New Prescriptions   No medications on file     Leo Grosser, MD 05/27/16 1444

## 2016-05-27 NOTE — ED Triage Notes (Signed)
Pt presents with bleeding from dialysis fistula, received heparin today. Dressing removed, blood oozing, easily stopped with moderate pressure. Pt alert, interactive, ambulatory in NAD.

## 2016-06-08 ENCOUNTER — Encounter (HOSPITAL_COMMUNITY): Payer: Self-pay | Admitting: Emergency Medicine

## 2016-06-08 ENCOUNTER — Emergency Department (HOSPITAL_COMMUNITY)
Admission: EM | Admit: 2016-06-08 | Discharge: 2016-06-08 | Disposition: A | Discharge status: Home / Self Care | Payer: Medicare Other | Attending: Emergency Medicine | Admitting: Emergency Medicine

## 2016-06-08 DIAGNOSIS — Z992 Dependence on renal dialysis: Secondary | ICD-10-CM | POA: Insufficient documentation

## 2016-06-08 DIAGNOSIS — Z8673 Personal history of transient ischemic attack (TIA), and cerebral infarction without residual deficits: Secondary | ICD-10-CM | POA: Insufficient documentation

## 2016-06-08 DIAGNOSIS — E1065 Type 1 diabetes mellitus with hyperglycemia: Secondary | ICD-10-CM | POA: Insufficient documentation

## 2016-06-08 DIAGNOSIS — I120 Hypertensive chronic kidney disease with stage 5 chronic kidney disease or end stage renal disease: Secondary | ICD-10-CM | POA: Diagnosis not present

## 2016-06-08 DIAGNOSIS — Z7982 Long term (current) use of aspirin: Secondary | ICD-10-CM | POA: Diagnosis not present

## 2016-06-08 DIAGNOSIS — N186 End stage renal disease: Secondary | ICD-10-CM | POA: Insufficient documentation

## 2016-06-08 DIAGNOSIS — E1022 Type 1 diabetes mellitus with diabetic chronic kidney disease: Secondary | ICD-10-CM | POA: Diagnosis not present

## 2016-06-08 DIAGNOSIS — E10319 Type 1 diabetes mellitus with unspecified diabetic retinopathy without macular edema: Secondary | ICD-10-CM | POA: Insufficient documentation

## 2016-06-08 DIAGNOSIS — R739 Hyperglycemia, unspecified: Secondary | ICD-10-CM

## 2016-06-08 DIAGNOSIS — R112 Nausea with vomiting, unspecified: Secondary | ICD-10-CM

## 2016-06-08 LAB — CBC
HEMATOCRIT: 37 % (ref 36.0–46.0)
HEMOGLOBIN: 12.1 g/dL (ref 12.0–15.0)
MCH: 27.2 pg (ref 26.0–34.0)
MCHC: 32.7 g/dL (ref 30.0–36.0)
MCV: 83.1 fL (ref 78.0–100.0)
Platelets: 273 10*3/uL (ref 150–400)
RBC: 4.45 MIL/uL (ref 3.87–5.11)
RDW: 17.1 % — ABNORMAL HIGH (ref 11.5–15.5)
WBC: 5.8 10*3/uL (ref 4.0–10.5)

## 2016-06-08 LAB — COMPREHENSIVE METABOLIC PANEL
ALT: 12 U/L — ABNORMAL LOW (ref 14–54)
ANION GAP: 18 — AB (ref 5–15)
AST: 17 U/L (ref 15–41)
Albumin: 4.6 g/dL (ref 3.5–5.0)
Alkaline Phosphatase: 129 U/L — ABNORMAL HIGH (ref 38–126)
BUN: 18 mg/dL (ref 6–20)
CHLORIDE: 86 mmol/L — AB (ref 101–111)
CO2: 27 mmol/L (ref 22–32)
Calcium: 9.9 mg/dL (ref 8.9–10.3)
Creatinine, Ser: 7.31 mg/dL — ABNORMAL HIGH (ref 0.44–1.00)
GFR calc Af Amer: 7 mL/min — ABNORMAL LOW (ref >60)
GFR calc non Af Amer: 6 mL/min — ABNORMAL LOW (ref >60)
Glucose, Bld: 779 mg/dL (ref 65–99)
Potassium: 4.7 mmol/L (ref 3.5–5.1)
SODIUM: 131 mmol/L — AB (ref 135–145)
Total Bilirubin: 0.7 mg/dL (ref 0.3–1.2)
Total Protein: 8.4 g/dL — ABNORMAL HIGH (ref 6.5–8.1)

## 2016-06-08 LAB — I-STAT BETA HCG BLOOD, ED (MC, WL, AP ONLY): I-stat hCG, quantitative: <5 m[IU]/mL (ref <5)

## 2016-06-08 LAB — CBG MONITORING, ED
GLUCOSE-CAPILLARY: 573 mg/dL — AB (ref 65–99)
Glucose-Capillary: 397 mg/dL — ABNORMAL HIGH (ref 65–99)

## 2016-06-08 LAB — LIPASE, BLOOD: Lipase: 33 U/L (ref 11–51)

## 2016-06-08 MED ORDER — METOCLOPRAMIDE HCL 5 MG/ML IJ SOLN
10.0000 mg | Freq: Once | INTRAMUSCULAR | Status: AC
  Administered 2016-06-08: 10 mg via INTRAVENOUS
  Filled 2016-06-08: qty 2

## 2016-06-08 MED ORDER — ONDANSETRON HCL 4 MG/2ML IJ SOLN
4.0000 mg | Freq: Once | INTRAMUSCULAR | Status: AC
  Administered 2016-06-08: 4 mg via INTRAVENOUS
  Filled 2016-06-08: qty 2

## 2016-06-08 MED ORDER — AMLODIPINE BESYLATE 5 MG PO TABS: 5.0000 mg | ORAL_TABLET | Freq: Once | ORAL | Status: DC

## 2016-06-08 MED ORDER — INSULIN ASPART 100 UNIT/ML ~~LOC~~ SOLN
10.0000 [IU] | Freq: Once | SUBCUTANEOUS | Status: AC
  Administered 2016-06-08: 10 [IU] via SUBCUTANEOUS
  Filled 2016-06-08: qty 1

## 2016-06-08 MED ORDER — INSULIN ASPART 100 UNIT/ML ~~LOC~~ SOLN
10.0000 [IU] | Freq: Once | SUBCUTANEOUS | Status: AC
  Administered 2016-06-08: 10 [IU] via INTRAVENOUS
  Filled 2016-06-08 (×2): qty 1

## 2016-06-08 MED ORDER — SODIUM CHLORIDE 0.9 % IV BOLUS (SEPSIS)
500.0000 mL | Freq: Once | INTRAVENOUS | Status: AC
  Administered 2016-06-08: 500 mL via INTRAVENOUS

## 2016-06-08 MED ORDER — ONDANSETRON 4 MG PO TBDP: ORAL_TABLET | ORAL | 0 refills | Status: DC

## 2016-06-08 MED ORDER — LABETALOL HCL 200 MG PO TABS: 600.0000 mg | ORAL_TABLET | Freq: Once | ORAL | Status: DC

## 2016-06-08 NOTE — ED Notes (Signed)
EDP notified of CBG [id number].

## 2016-06-08 NOTE — ED Provider Notes (Signed)
[facility] DEPT Provider Note   CSN: [id number] Arrival date & time: 06/08/16  1635     History   Chief Complaint Chief Complaint  Patient presents with  . Emesis    HPI [name] is a 40 y.o. female.  Patient is a 40 year old female with a history of diabetes, hypertension, hyperlipidemia and end-stage renal disease on dialysis Tuesday Thursday Saturday. She completed her dialysis yesterday and following that she's had the onset of nausea and vomiting. She states she hasn't been able keep anything down since that time. She denies any diarrhea. No fevers. No abdominal pain. She produces a very small amount of urine that she has no change in this. No cough or cold symptoms. She states that she feels like her blood sugar is elevated but her machine at home broke so she hasn't been able to check it.      Past Medical History:  Diagnosis Date  . Anemia   . Arthritis   . Diabetes mellitus   . Diabetic retinopathy ([place])    Hx of laser Rx's  . DM type 1 (diabetes mellitus, type 1) ([place]) 09/12/2014   Age of onset for DM type 1 was age 24.     Marland Kitchen ESRD on hemodialysis ([place])    ESRD due to DM type I, age of onset DM 1 was age 47.  Went on dialysis in March 2012.  Gets HD now at [facility] on a TTS schedule.    Marland Kitchen ESRD on hemodialysis ([place]) 09/12/2014   ESRD due to DM type 1.  Started HD in 2012 at [facility].  Gets HD there on a TTS schedule.    Marland Kitchen GERD (gastroesophageal reflux disease)   . History of blood transfusion   . Hyperlipidemia   . Hypertension   . Peripheral vascular disease [facility])     Patient Active Problem List   Diagnosis Date Noted  . Hyperparathyroidism, secondary renal ([place])   . Benign essential HTN   . Pneumonia 09/12/2014  . CAP (community acquired pneumonia) 09/12/2014  . ESRD on hemodialysis ([place]) 09/12/2014  . DM type 1 (diabetes mellitus, type 1) ([place]) 09/12/2014  . History of diabetic retinopathy 09/12/2014  . HCAP (healthcare-associated  pneumonia) 09/11/2014  . Cerebral infarction due to unspecified mechanism 05/22/2014  . Hypertensive emergency 05/22/2014  . Essential hypertension, benign 05/22/2014  . Type 2 diabetes mellitus with other circulatory complications 95/03/3266  . HLD (hyperlipidemia) 05/22/2014  . CVA (cerebral infarction) 03/29/2014  . Facial droop 03/29/2014  . Mechanical complication of other vascular device, implant, and graft 01/12/2013  . Other complications due to renal dialysis device, implant, and graft 12/08/2012  . ESRD on hemodialysis ([place]) 04/18/2011    Past Surgical History:  Procedure Laterality Date  . ARTERIOVENOUS GRAFT PLACEMENT    . ARTERY REPAIR Left 12/10/2012   Procedure: BRACHIAL ARTERY REPAIR;  Surgeon: Angelia Mould, MD;  Location: [facility] OR;  Service: Vascular;  Laterality: Left;  Exploration Left Brachial Artery for AVF  . CESAREAN SECTION    . EYE SURGERY     LAzer  . SHUNTOGRAM Left 11/30/2013   Procedure: SHUNTOGRAM;  Surgeon: Serafina Mitchell, MD;  Location: [facility] CATH LAB;  Service: Cardiovascular;  Laterality: Left;    OB History    Gravida Para Term Preterm AB Living   0 0 0 0 0     SAB TAB Ectopic Multiple Live Births   0 0 0  Home Medications    Prior to Admission medications   Medication Sig Start Date End Date Taking? Authorizing Provider  amLODipine (NORVASC) 10 MG tablet Take 10 mg by mouth daily.    Yes Historical Provider, MD  aspirin 81 MG EC tablet Take 81 mg by mouth daily. Swallow whole.   Yes Historical Provider, MD  calcium acetate (PHOSLO) 667 MG capsule Take 667 mg by mouth 3 (three) times daily with meals.     Yes Historical Provider, MD  cinacalcet (SENSIPAR) 30 MG tablet Take 30 mg by mouth daily.   Yes Historical Provider, MD  GLUCAGON EMERGENCY 1 MG injection Inject 1 mg into the muscle once as needed (for severe low blood sugar).  11/22/12  Yes Historical Provider, MD  insulin glargine (LANTUS) 100 UNIT/ML injection Inject 10  Units into the skin at bedtime.    Yes Historical Provider, MD  insulin lispro (HUMALOG) 100 UNIT/ML injection Inject 5-15 Units into the skin 3 (three) times daily before meals. 1 units for ever 20 gms of carbs   Yes Historical Provider, MD  labetalol (NORMODYNE) 300 MG tablet Take 300 mg by mouth 3 (three) times daily.   Yes Historical Provider, MD  lidocaine-prilocaine (EMLA) cream Apply 1 application topically See admin instructions. Taking on dialysis days tues, thurs and sat 03/16/14  Yes Historical Provider, MD  Pediatric Multiple Vit-C-FA (FLINSTONES GUMMIES OMEGA-3 DHA) CHEW Chew 1 tablet by mouth daily.    Yes Historical Provider, MD  rosuvastatin (CRESTOR) 10 MG tablet Take 10 mg by mouth daily.    Yes Historical Provider, MD  valsartan (DIOVAN) 160 MG tablet Take 160 mg by mouth daily. 10/05/14  Yes Historical Provider, MD  amoxicillin-clavulanate (AUGMENTIN) 875-125 MG per tablet Take 1 tablet by mouth every 12 (twelve) hours. Patient not taking: Reported on 06/08/2016 09/14/14   Barton Dubois, MD  aspirin 325 MG tablet Take 1 tablet (325 mg total) by mouth daily. Patient not taking: Reported on 06/08/2016 03/31/14   Maryann Mikhail, DO  fluticasone (FLONASE) 50 MCG/ACT nasal spray Place 1 spray into both nostrils daily. Patient not taking: Reported on 06/08/2016 09/14/14   Barton Dubois, MD  guaiFENesin (MUCINEX) 600 MG 12 hr tablet Take 1 tablet (600 mg total) by mouth 2 (two) times daily. Patient not taking: Reported on 06/08/2016 09/14/14   Barton Dubois, MD  ondansetron (ZOFRAN ODT) 4 MG disintegrating tablet 4mg  ODT q4 hours prn nausea/vomit 06/08/16   Malvin Johns, MD    Family History Family History  Problem Relation Age of Onset  . Cancer Mother     breast  . Hypertension Father     Social History Social History  Substance Use Topics  . Smoking status: Never Smoker  . Smokeless tobacco: Never Used  . Alcohol use No     Allergies   Pollen extract   Review of  Systems Review of Systems  Constitutional: Positive for fatigue. Negative for chills, diaphoresis and fever.  HENT: Negative for congestion, rhinorrhea and sneezing.   Eyes: Negative.   Respiratory: Negative for cough, chest tightness and shortness of breath.   Cardiovascular: Negative for chest pain and leg swelling.  Gastrointestinal: Positive for nausea and vomiting. Negative for abdominal pain, blood in stool and diarrhea.  Genitourinary: Negative for difficulty urinating, flank pain, frequency and hematuria.  Musculoskeletal: Negative for arthralgias and back pain.  Skin: Negative for rash.  Neurological: Positive for light-headedness. Negative for dizziness, speech difficulty, weakness, numbness and headaches.     Physical  Exam Updated Vital Signs BP 177/93   Pulse 89   Temp 98.4 F (36.9 C) (Oral)   Resp 12   Ht 5\' 3"  (1.6 m)   Wt 112 lb 7 oz (51 kg)   LMP 05/15/2016   SpO2 97%   BMI 19.92 kg/m   Physical Exam  Constitutional: She is oriented to person, place, and time. She appears well-developed and well-nourished.  HENT:  Head: Normocephalic and atraumatic.  Eyes: Pupils are equal, round, and reactive to light.  Neck: Normal range of motion. Neck supple.  Cardiovascular: Normal rate, regular rhythm and normal heart sounds.   Pulmonary/Chest: Effort normal and breath sounds normal. No respiratory distress. She has no wheezes. She has no rales. She exhibits no tenderness.  Abdominal: Soft. Bowel sounds are normal. There is no tenderness. There is no rebound and no guarding.  Musculoskeletal: Normal range of motion. She exhibits no edema.  Lymphadenopathy:    She has no cervical adenopathy.  Neurological: She is alert and oriented to person, place, and time.  Skin: Skin is warm and dry. No rash noted.  Psychiatric: She has a normal mood and affect.     ED Treatments / Results  Labs (all labs ordered are listed, but only abnormal results are displayed) Labs  Reviewed  COMPREHENSIVE METABOLIC PANEL - Abnormal; Notable for the following:       Result Value   Sodium 131 (*)    Chloride 86 (*)    Glucose, Bld 779 (*)    Creatinine, Ser 7.31 (*)    Total Protein 8.4 (*)    ALT 12 (*)    Alkaline Phosphatase 129 (*)    GFR calc non Af Amer 6 (*)    GFR calc Af Amer 7 (*)    Anion gap 18 (*)    All other components within normal limits  CBC - Abnormal; Notable for the following:    RDW 17.1 (*)    All other components within normal limits  CBG MONITORING, ED - Abnormal; Notable for the following:    Glucose-Capillary >600 (*)    All other components within normal limits  CBG MONITORING, ED - Abnormal; Notable for the following:    Glucose-Capillary 573 (*)    All other components within normal limits  CBG MONITORING, ED - Abnormal; Notable for the following:    Glucose-Capillary 397 (*)    All other components within normal limits  LIPASE, BLOOD  I-STAT BETA HCG BLOOD, ED ([facility], [facility], [facility] ONLY)    EKG  EKG Interpretation None       Radiology No results found.  Procedures Procedures (including critical care time)  Medications Ordered in ED Medications  sodium chloride 0.9 % bolus 500 mL (0 mLs Intravenous Stopped 06/08/16 2242)  ondansetron (ZOFRAN) injection 4 mg (4 mg Intravenous Given 06/08/16 1825)  insulin aspart (novoLOG) injection 10 Units (10 Units Subcutaneous Given 06/08/16 1946)  metoCLOPramide (REGLAN) injection 10 mg (10 mg Intravenous Given 06/08/16 1949)  insulin aspart (novoLOG) injection 10 Units (10 Units Intravenous Given 06/08/16 2123)     Initial Impression / Assessment and Plan / ED Course  I have reviewed the triage vital signs and the nursing notes.  Pertinent labs & imaging results that were available during my care of the patient were reviewed by me and considered in my medical decision making (see chart for details).  Clinical Course     Patient presents with nausea and vomiting. She has no  abdominal tenderness.  She had a mild headache on arrival but this resolved during the ED course. She was given IV fluids cautiously. She was only given about 500 cc. She was also given Zofran which didn't seem to help too much. She was then given Reglan and was able to tolerate oral fluids after this. She was also given subcutaneous insulin in her sugar has continued to improve. There is no evidence of DKA. She does not produce urine. She was discharged home in good condition. She has dialysis tomorrow. I advised her to talk with her doctor about getting a new glucometer at home. She was encouraged to return here if she has any worsening symptoms.  Final Clinical Impressions(s) / ED Diagnoses   Final diagnoses:  Non-intractable vomiting with nausea, unspecified vomiting type  Hyperglycemia    New Prescriptions New Prescriptions   ONDANSETRON (ZOFRAN ODT) 4 MG DISINTEGRATING TABLET    4mg  ODT q4 hours prn nausea/vomit     Malvin Johns, MD 06/08/16 [phone]

## 2016-06-08 NOTE — ED Notes (Signed)
Patient unable to make urine. Dialysis patient.

## 2016-06-08 NOTE — ED Notes (Signed)
One unsuccessful attempt at IV access

## 2016-06-08 NOTE — ED Triage Notes (Signed)
C/o vomiting and generalized weakness since having dialysis yesterday.  Denies pain.  Denies diarrhea or any other symptoms.

## 2016-07-10 ENCOUNTER — Encounter: Payer: Self-pay | Admitting: Vascular Surgery

## 2016-07-15 ENCOUNTER — Encounter (HOSPITAL_COMMUNITY): Payer: Self-pay | Admitting: Physical Medicine and Rehabilitation

## 2016-07-15 ENCOUNTER — Emergency Department (HOSPITAL_COMMUNITY)
Admission: EM | Admit: 2016-07-15 | Discharge: 2016-07-15 | Disposition: A | Discharge status: Home / Self Care | Payer: Medicare Other | Attending: Emergency Medicine | Admitting: Emergency Medicine

## 2016-07-15 DIAGNOSIS — E10649 Type 1 diabetes mellitus with hypoglycemia without coma: Secondary | ICD-10-CM | POA: Insufficient documentation

## 2016-07-15 DIAGNOSIS — Z7982 Long term (current) use of aspirin: Secondary | ICD-10-CM | POA: Insufficient documentation

## 2016-07-15 DIAGNOSIS — Z8673 Personal history of transient ischemic attack (TIA), and cerebral infarction without residual deficits: Secondary | ICD-10-CM | POA: Insufficient documentation

## 2016-07-15 DIAGNOSIS — Z992 Dependence on renal dialysis: Secondary | ICD-10-CM | POA: Insufficient documentation

## 2016-07-15 DIAGNOSIS — I120 Hypertensive chronic kidney disease with stage 5 chronic kidney disease or end stage renal disease: Secondary | ICD-10-CM | POA: Diagnosis not present

## 2016-07-15 DIAGNOSIS — Z79899 Other long term (current) drug therapy: Secondary | ICD-10-CM | POA: Diagnosis not present

## 2016-07-15 DIAGNOSIS — E162 Hypoglycemia, unspecified: Secondary | ICD-10-CM

## 2016-07-15 DIAGNOSIS — R4182 Altered mental status, unspecified: Secondary | ICD-10-CM | POA: Diagnosis present

## 2016-07-15 DIAGNOSIS — N186 End stage renal disease: Secondary | ICD-10-CM | POA: Insufficient documentation

## 2016-07-15 LAB — I-STAT CHEM 8, ED
BUN: 48 mg/dL — ABNORMAL HIGH (ref 6–20)
CALCIUM ION: 1.03 mmol/L — AB (ref 1.15–1.40)
CHLORIDE: 98 mmol/L — AB (ref 101–111)
Creatinine, Ser: 10.7 mg/dL — ABNORMAL HIGH (ref 0.44–1.00)
GLUCOSE: 243 mg/dL — AB (ref 65–99)
HCT: 31 % — ABNORMAL LOW (ref 36.0–46.0)
Hemoglobin: 10.5 g/dL — ABNORMAL LOW (ref 12.0–15.0)
Potassium: 4.4 mmol/L (ref 3.5–5.1)
Sodium: 137 mmol/L (ref 135–145)
TCO2: 25 mmol/L (ref 0–100)

## 2016-07-15 LAB — CBG MONITORING, ED: GLUCOSE-CAPILLARY: 223 mg/dL — AB (ref 65–99)

## 2016-07-15 NOTE — ED Notes (Signed)
Pt given diet gingerale for fluid challenge.

## 2016-07-15 NOTE — ED Provider Notes (Signed)
[facility] DEPT Provider Note   CSN: [id number] Arrival date & time: 07/15/16  0809     History   Chief Complaint Chief Complaint  Patient presents with  . Altered Mental Status    HPI [name] is a 40 y.o. female.  The history is provided by the patient and medical records.    40 year old female with history of anemia, DM1, end-stage renal disease on hemodialysis, hyperlipidemia, peripheral vascular disease, GERD, presenting to the ED with transient altered mental status. Reportedly per EMS, patient was unresponsive this morning when had to be awoken by her family to get ready for dialysis. Her CBG on arrival was 53. Patient is scheduled for dialysis today. She attends her [facility]. On arrival to the ED patient is awake, alert, fully oriented to her baseline. She has no complaints at this time.  She denies any pain, chest pain, shortness of breath, confusion, numbness, or weakness. States this has happened in the past when her sugar is low. States her usual dialysis session is from  7:15am-11am.  Past Medical History:  Diagnosis Date  . Anemia   . Arthritis   . Diabetes mellitus   . Diabetic retinopathy ([place])    Hx of laser Rx's  . DM type 1 (diabetes mellitus, type 1) ([place]) 09/12/2014   Age of onset for DM type 1 was age 48.     Marland Kitchen ESRD on hemodialysis ([place])    ESRD due to DM type I, age of onset DM 1 was age 6.  Went on dialysis in March 2012.  Gets HD now at [facility] on a TTS schedule.    Marland Kitchen ESRD on hemodialysis ([place]) 09/12/2014   ESRD due to DM type 1.  Started HD in 2012 at [facility].  Gets HD there on a TTS schedule.    Marland Kitchen GERD (gastroesophageal reflux disease)   . History of blood transfusion   . Hyperlipidemia   . Hypertension   . Peripheral vascular disease [facility])     Patient Active Problem List   Diagnosis Date Noted  . Hyperparathyroidism, secondary renal ([place])   . Benign essential HTN   . Pneumonia 09/12/2014    . CAP (community acquired pneumonia) 09/12/2014  . ESRD on hemodialysis ([place]) 09/12/2014  . DM type 1 (diabetes mellitus, type 1) ([place]) 09/12/2014  . History of diabetic retinopathy 09/12/2014  . HCAP (healthcare-associated pneumonia) 09/11/2014  . Cerebral infarction due to unspecified mechanism 05/22/2014  . Hypertensive emergency 05/22/2014  . Essential hypertension, benign 05/22/2014  . Type 2 diabetes mellitus with other circulatory complications 57/32/2025  . HLD (hyperlipidemia) 05/22/2014  . CVA (cerebral infarction) 03/29/2014  . Facial droop 03/29/2014  . Mechanical complication of other vascular device, implant, and graft 01/12/2013  . Other complications due to renal dialysis device, implant, and graft 12/08/2012  . ESRD on hemodialysis ([place]) 04/18/2011    Past Surgical History:  Procedure Laterality Date  . ARTERIOVENOUS GRAFT PLACEMENT    . ARTERY REPAIR Left 12/10/2012   Procedure: BRACHIAL ARTERY REPAIR;  Surgeon: Angelia Mould, MD;  Location: [facility] OR;  Service: Vascular;  Laterality: Left;  Exploration Left Brachial Artery for AVF  . CESAREAN SECTION    . EYE SURGERY     LAzer  . SHUNTOGRAM Left 11/30/2013   Procedure: SHUNTOGRAM;  Surgeon: Serafina Mitchell, MD;  Location: [facility] CATH LAB;  Service: Cardiovascular;  Laterality: Left;    OB History    Gravida Para Term  Preterm AB Living   0 0 0 0 0     SAB TAB Ectopic Multiple Live Births   0 0 0           Home Medications    Prior to Admission medications   Medication Sig Start Date End Date Taking? Authorizing Provider  amLODipine (NORVASC) 10 MG tablet Take 10 mg by mouth daily.    Yes Historical Provider, MD  aspirin 81 MG EC tablet Take 81 mg by mouth daily. Swallow whole.   Yes Historical Provider, MD  calcium acetate (PHOSLO) 667 MG capsule Take 667 mg by mouth 3 (three) times daily with meals.     Yes Historical Provider, MD  cinacalcet (SENSIPAR) 30 MG tablet Take 30 mg by mouth daily.   Yes  Historical Provider, MD  GLUCAGON EMERGENCY 1 MG injection Inject 1 mg into the muscle once as needed (for severe low blood sugar).  11/22/12  Yes Historical Provider, MD  insulin aspart (NOVOLOG) 100 UNIT/ML FlexPen Inject 5-15 Units into the skin See admin instructions. Based on sliding scale 06/16/16  Yes Historical Provider, MD  insulin glargine (LANTUS) 100 UNIT/ML injection Inject 10 Units into the skin at bedtime.    Yes Historical Provider, MD  labetalol (NORMODYNE) 300 MG tablet Take 300 mg by mouth 3 (three) times daily.   Yes Historical Provider, MD  ondansetron (ZOFRAN ODT) 4 MG disintegrating tablet 4mg  ODT q4 hours prn nausea/vomit Patient taking differently: Take 4 mg by mouth every 4 (four) hours as needed for nausea. 4mg  ODT q4 hours prn nausea/vomit 06/08/16  Yes Malvin Johns, MD  Pediatric Multiple Vit-C-FA (FLINSTONES GUMMIES OMEGA-3 DHA) CHEW Chew 1 tablet by mouth daily.    Yes Historical Provider, MD  rosuvastatin (CRESTOR) 10 MG tablet Take 10 mg by mouth daily.    Yes Historical Provider, MD  valsartan (DIOVAN) 160 MG tablet Take 160 mg by mouth daily. 10/05/14  Yes Historical Provider, MD  amoxicillin-clavulanate (AUGMENTIN) 875-125 MG per tablet Take 1 tablet by mouth every 12 (twelve) hours. Patient not taking: Reported on 07/15/2016 09/14/14   Barton Dubois, MD  aspirin 325 MG tablet Take 1 tablet (325 mg total) by mouth daily. Patient not taking: Reported on 07/15/2016 03/31/14   Maryann Mikhail, DO  fluticasone (FLONASE) 50 MCG/ACT nasal spray Place 1 spray into both nostrils daily. Patient not taking: Reported on 07/15/2016 09/14/14   Barton Dubois, MD  guaiFENesin (MUCINEX) 600 MG 12 hr tablet Take 1 tablet (600 mg total) by mouth 2 (two) times daily. Patient not taking: Reported on 07/15/2016 09/14/14   Barton Dubois, MD    Family History Family History  Problem Relation Age of Onset  . Cancer Mother     breast  . Hypertension Father     Social History Social  History  Substance Use Topics  . Smoking status: Never Smoker  . Smokeless tobacco: Never Used  . Alcohol use No     Allergies   Pollen extract   Review of Systems Review of Systems  Constitutional:       AMS-- resolved  All other systems reviewed and are negative.    Physical Exam Updated Vital Signs BP 153/87   Pulse 76   Temp 98.7 F (37.1 C) (Oral)   Resp (!) 9   Ht 5\' 3"  (1.6 m)   Wt 51.5 kg   SpO2 99%   BMI 20.11 kg/m   Physical Exam  Constitutional: She is oriented to person, place, and  time. She appears well-developed and well-nourished.  Awake, alert, conversing normally, NAD  HENT:  Head: Normocephalic and atraumatic.  Mouth/Throat: Oropharynx is clear and moist.  Eyes: Conjunctivae and EOM are normal. Pupils are equal, round, and reactive to light.  Neck: Normal range of motion.  Cardiovascular: Normal rate, regular rhythm and normal heart sounds.   Pulmonary/Chest: Effort normal and breath sounds normal. No respiratory distress. She has no wheezes.  Abdominal: Soft. Bowel sounds are normal.  Musculoskeletal: Normal range of motion.  Neurological: She is alert and oriented to person, place, and time.  AAOx3, answering questions and following commands appropriately; equal strength UE and LE bilaterally; CN grossly intact; moves all extremities appropriately without ataxia; no focal neuro deficits or facial asymmetry appreciated  Skin: Skin is warm and dry.  Psychiatric: She has a normal mood and affect.  Nursing note and vitals reviewed.    ED Treatments / Results  Labs (all labs ordered are listed, but only abnormal results are displayed) Labs Reviewed  CBG MONITORING, ED - Abnormal; Notable for the following:       Result Value   Glucose-Capillary 223 (*)    All other components within normal limits  I-STAT CHEM 8, ED - Abnormal; Notable for the following:    Chloride 98 (*)    BUN 48 (*)    Creatinine, Ser 10.70 (*)    Glucose, Bld 243 (*)     Calcium, Ion 1.03 (*)    Hemoglobin 10.5 (*)    HCT 31.0 (*)    All other components within normal limits    EKG  EKG Interpretation None       Radiology No results found.  Procedures Procedures (including critical care time)  Medications Ordered in ED Medications - No data to display   Initial Impression / Assessment and Plan / ED Course  I have reviewed the triage vital signs and the nursing notes.  Pertinent labs & imaging results that were available during my care of the patient were reviewed by me and considered in my medical decision making (see chart for details).  Clinical Course    40 year old female here with altered mental status. She was unresponsive at home with CBG of 53. Patient is awake, alert, oriented on arrival here. CBG here is 223. Patient has no complaints at this time, states she feels fine.  Neurologically intact.  No signs of systemic infection.  I-STAT Chem-8 was obtained, hemoglobin is stable. Creatinine is consistent with her dialysis history. Potassium is normal at 4.4.  Will monitor here to ensure CBG remains stable.  Suspect may be from her insulin as she takes lantus and novolog.  10:39 AM Spoke with Caryl Pina from patient's dialysis center-- schedule is full today, can squeeze her in tomorrow at 6:30am.  Patient is stable at this time, does not appear significantly fluid overloaded.  CBG has been stable.  VSS.  Feel it is reasonable to have treatment tomorrow.  Patient and family comfortable with this care plan.  11:14 AM Received call from Juanell Fairly at dialysis center just prior to discharge-- had a no show for today at 11am so can dialyze today.  Patient will be discharged to go directly to center for treatment.  Family updated with care plan.  Final Clinical Impressions(s) / ED Diagnoses   Final diagnoses:  Hypoglycemia  ESRD on hemodialysis [facility])    New Prescriptions Discharge Medication List as of 07/15/2016 11:15 AM       Vilinda Blanks  [facility] [place] 07/15/16 1209    Virgel Manifold, MD 07/15/16 [phone]

## 2016-07-15 NOTE — ED Triage Notes (Signed)
Pt was found unresponsive by family at home. Initial CBG of 53. Pt scheduled for hemodialysis today. Pt is alert and oriented x4 upon arrival to ED. CBG 160 per EMS.

## 2016-07-15 NOTE — Discharge Instructions (Signed)
Go to the dialysis center after you leave here for treatment. Follow-up with your primary care doctor. Keep a close eye on your blood sugar at home. Return here for new concerns.

## 2016-07-15 NOTE — ED Notes (Signed)
Lisa PA at bedside

## 2016-07-15 NOTE — ED Notes (Signed)
Per Jinny Blossom RN, pt was given glucagon with EMS.

## 2016-07-22 DIAGNOSIS — R739 Hyperglycemia, unspecified: Secondary | ICD-10-CM | POA: Diagnosis not present

## 2016-07-22 DIAGNOSIS — D509 Iron deficiency anemia, unspecified: Secondary | ICD-10-CM | POA: Diagnosis not present

## 2016-07-22 DIAGNOSIS — N2581 Secondary hyperparathyroidism of renal origin: Secondary | ICD-10-CM | POA: Diagnosis not present

## 2016-07-22 DIAGNOSIS — D631 Anemia in chronic kidney disease: Secondary | ICD-10-CM | POA: Diagnosis not present

## 2016-07-22 DIAGNOSIS — N186 End stage renal disease: Secondary | ICD-10-CM | POA: Diagnosis not present

## 2016-07-22 DIAGNOSIS — E1029 Type 1 diabetes mellitus with other diabetic kidney complication: Secondary | ICD-10-CM | POA: Diagnosis not present

## 2016-07-23 ENCOUNTER — Encounter: Payer: Self-pay | Admitting: Vascular Surgery

## 2016-07-23 ENCOUNTER — Other Ambulatory Visit: Payer: Self-pay

## 2016-07-23 ENCOUNTER — Ambulatory Visit (INDEPENDENT_AMBULATORY_CARE_PROVIDER_SITE_OTHER): Payer: Medicare Other | Admitting: Vascular Surgery

## 2016-07-23 VITALS — BP 176/87 | HR 87 | Temp 97.6°F | Resp 18 | Ht 63.5 in | Wt 119.5 lb

## 2016-07-23 DIAGNOSIS — Z992 Dependence on renal dialysis: Secondary | ICD-10-CM

## 2016-07-23 DIAGNOSIS — N186 End stage renal disease: Secondary | ICD-10-CM | POA: Diagnosis not present

## 2016-07-23 NOTE — Progress Notes (Signed)
Patient name: [name] MRN: [id number] DOB: 24-Aug-1975 Sex: female  REASON FOR CONSULT: Evaluate for possible revision of left upper arm graft.  HPI: [name] is a 41 y.o. female, who underwent a fistulogram by [facility] on 06/23/2016. This showed a chronically occluded axillary vein. We are asked to consider her for a revision. She states that the graft has been working recently well. She dialyzes on Tuesdays Thursdays and Saturday. She has had some bleeding episodes when they cannulate the aneurysmal segment along the lateral aspect of her upper arm.  Past Medical History:  Diagnosis Date  . Anemia   . Arthritis   . Diabetes mellitus   . Diabetic retinopathy ([place])    Hx of laser Rx's  . DM type 1 (diabetes mellitus, type 1) ([place]) 09/12/2014   Age of onset for DM type 1 was age 80.     Marland Kitchen ESRD on hemodialysis ([place])    ESRD due to DM type I, age of onset DM 1 was age 25.  Went on dialysis in March 2012.  Gets HD now at [facility] on a TTS schedule.    Marland Kitchen ESRD on hemodialysis ([place]) 09/12/2014   ESRD due to DM type 1.  Started HD in 2012 at [facility].  Gets HD there on a TTS schedule.    Marland Kitchen GERD (gastroesophageal reflux disease)   . History of blood transfusion   . Hyperlipidemia   . Hypertension   . Peripheral vascular disease ([place])     Family History  Problem Relation Age of Onset  . Cancer Mother     breast  . Hypertension Father     SOCIAL HISTORY: Social History   Social History  . Marital status: Single    Spouse name: N/A  . Number of children: 1  . Years of education: [employer]   Occupational History  . Not on file.   Social History Main Topics  . Smoking status: Never Smoker  . Smokeless tobacco: Never Used  . Alcohol use No  . Drug use: No  . Sexual activity: Not on file   Other Topics Concern  . Not on file   Social History Narrative   ** Merged History Encounter **       Patient is single with 1 child. Patient is right  handed. Patient has BS degree. Patient drinks 0 caffeine.    Allergies  Allergen Reactions  . Pollen Extract Other (See Comments)    Watery eyes    Current Outpatient Prescriptions  Medication Sig Dispense Refill  . amLODipine (NORVASC) 10 MG tablet Take 10 mg by mouth daily.     Marland Kitchen aspirin 81 MG EC tablet Take 81 mg by mouth daily. Swallow whole.    . calcium acetate (PHOSLO) 667 MG capsule Take 667 mg by mouth 3 (three) times daily with meals.      . cinacalcet (SENSIPAR) 30 MG tablet Take 30 mg by mouth daily.    Marland Kitchen GLUCAGON EMERGENCY 1 MG injection Inject 1 mg into the muscle once as needed (for severe low blood sugar).     . insulin aspart (NOVOLOG) 100 UNIT/ML FlexPen Inject 5-15 Units into the skin See admin instructions. Based on sliding scale    . insulin glargine (LANTUS) 100 UNIT/ML injection Inject 10 Units into the skin at bedtime.     Marland Kitchen labetalol (NORMODYNE) 300 MG tablet Take 300 mg by mouth 3 (three) times daily.    . ondansetron [facility]  ODT) 4 MG disintegrating tablet 4mg  ODT q4 hours prn nausea/vomit (Patient taking differently: Take 4 mg by mouth every 4 (four) hours as needed for nausea. 4mg  ODT q4 hours prn nausea/vomit) 4 tablet 0  . Pediatric Multiple Vit-C-FA (FLINSTONES GUMMIES OMEGA-3 DHA) CHEW Chew 1 tablet by mouth daily.     . rosuvastatin (CRESTOR) 10 MG tablet Take 10 mg by mouth daily.     . valsartan (DIOVAN) 160 MG tablet Take 160 mg by mouth daily.  6  . amoxicillin-clavulanate (AUGMENTIN) 875-125 MG per tablet Take 1 tablet by mouth every 12 (twelve) hours. (Patient not taking: Reported on 07/23/2016) 12 tablet 0  . aspirin 325 MG tablet Take 1 tablet (325 mg total) by mouth daily. (Patient not taking: Reported on 07/23/2016)    . fluticasone (FLONASE) 50 MCG/ACT nasal spray Place 1 spray into both nostrils daily. (Patient not taking: Reported on 07/23/2016) 16 g 2  . guaiFENesin (MUCINEX) 600 MG 12 hr tablet Take 1 tablet (600 mg total) by mouth 2 (two) times  daily. (Patient not taking: Reported on 07/23/2016) 30 tablet 0   No current facility-administered medications for this visit.     REVIEW OF SYSTEMS:  [X]  denotes positive finding, [ ]  denotes negative finding Cardiac  Comments:  Chest pain or chest pressure:    Shortness of breath upon exertion:    Short of breath when lying flat:    Irregular heart rhythm:        Vascular    Pain in calf, thigh, or hip brought on by ambulation:    Pain in feet at night that wakes you up from your sleep:     Blood clot in your veins:    Leg swelling:         Pulmonary    Oxygen at home:    Productive cough:     Wheezing:         Neurologic    Sudden weakness in arms or legs:     Sudden numbness in arms or legs:     Sudden onset of difficulty speaking or slurred speech:    Temporary loss of vision in one eye:     Problems with dizziness:         Gastrointestinal    Blood in stool:     Vomited blood:         Genitourinary    Burning when urinating:     Blood in urine:        Psychiatric    Major depression:         Hematologic    Bleeding problems:    Problems with blood clotting too easily:        Skin    Rashes or ulcers:        Constitutional    Fever or chills:      PHYSICAL EXAM: Vitals:   07/23/16 1155 07/23/16 1200  BP: (!) 178/89 (!) 176/87  Pulse: 87   Resp: 18   Temp: 97.6 F (36.4 C)   TempSrc: Oral   SpO2: 98%   Weight: 119 lb 8 oz (54.2 kg)   Height: 5' 3.5" (1.613 m)     GENERAL: The patient is a well-nourished female, in no acute distress. The vital signs are documented above. CARDIAC: There is a regular rate and rhythm.  VASCULAR: She has a palpable left radial pulse. Her upper arm graft is pulsatile. She has an upper arm loop graft in the venous limb  of the graft is along the lateral aspect of the upper arm. PULMONARY: There is good air exchange bilaterally without wheezing or rales. ABDOMEN: Soft and non-tender with normal pitched bowel sounds.    MUSCULOSKELETAL: There are no major deformities or cyanosis. NEUROLOGIC: No focal weakness or paresthesias are detected. SKIN: There are no ulcers or rashes noted. PSYCHIATRIC: The patient has a normal affect.  DATA:   I have reviewed the images of the fistulogram that was sent with the patient. The patient has an acute axillary vein. The upper arm graft has aneurysmal areas and is somewhat degenerative. There do appear to be patent adjacent veins in the axilla which could potentially be used for outflow.  MEDICAL ISSUES:  POORLY FUNCTIONING LEFT UPPER ARM AV GRAFT: I think that it would be reasonable to attempt revision of this graft and try to find an adjacent vein to revise the venous anastomosis. If this were not successful then I would have to place a catheter and we would have to evaluate her for new access. However there does appear to be an adjacent vein in the axilla which could be used for outflow.  Her surgery is scheduled for 08/01/2016. We have discussed the procedure and risks and she is agreeable to proceed.   [name] [facility] [place] [phone]

## 2016-07-24 DIAGNOSIS — R739 Hyperglycemia, unspecified: Secondary | ICD-10-CM | POA: Diagnosis not present

## 2016-07-24 DIAGNOSIS — D631 Anemia in chronic kidney disease: Secondary | ICD-10-CM | POA: Diagnosis not present

## 2016-07-24 DIAGNOSIS — N2581 Secondary hyperparathyroidism of renal origin: Secondary | ICD-10-CM | POA: Diagnosis not present

## 2016-07-24 DIAGNOSIS — N186 End stage renal disease: Secondary | ICD-10-CM | POA: Diagnosis not present

## 2016-07-24 DIAGNOSIS — D509 Iron deficiency anemia, unspecified: Secondary | ICD-10-CM | POA: Diagnosis not present

## 2016-07-24 DIAGNOSIS — E1029 Type 1 diabetes mellitus with other diabetic kidney complication: Secondary | ICD-10-CM | POA: Diagnosis not present

## 2016-07-26 DIAGNOSIS — R739 Hyperglycemia, unspecified: Secondary | ICD-10-CM | POA: Diagnosis not present

## 2016-07-26 DIAGNOSIS — E1029 Type 1 diabetes mellitus with other diabetic kidney complication: Secondary | ICD-10-CM | POA: Diagnosis not present

## 2016-07-26 DIAGNOSIS — N186 End stage renal disease: Secondary | ICD-10-CM | POA: Diagnosis not present

## 2016-07-26 DIAGNOSIS — N2581 Secondary hyperparathyroidism of renal origin: Secondary | ICD-10-CM | POA: Diagnosis not present

## 2016-07-26 DIAGNOSIS — D509 Iron deficiency anemia, unspecified: Secondary | ICD-10-CM | POA: Diagnosis not present

## 2016-07-26 DIAGNOSIS — D631 Anemia in chronic kidney disease: Secondary | ICD-10-CM | POA: Diagnosis not present

## 2016-07-29 DIAGNOSIS — N186 End stage renal disease: Secondary | ICD-10-CM | POA: Diagnosis not present

## 2016-07-29 DIAGNOSIS — N2581 Secondary hyperparathyroidism of renal origin: Secondary | ICD-10-CM | POA: Diagnosis not present

## 2016-07-29 DIAGNOSIS — R739 Hyperglycemia, unspecified: Secondary | ICD-10-CM | POA: Diagnosis not present

## 2016-07-29 DIAGNOSIS — D509 Iron deficiency anemia, unspecified: Secondary | ICD-10-CM | POA: Diagnosis not present

## 2016-07-29 DIAGNOSIS — D631 Anemia in chronic kidney disease: Secondary | ICD-10-CM | POA: Diagnosis not present

## 2016-07-29 DIAGNOSIS — E1029 Type 1 diabetes mellitus with other diabetic kidney complication: Secondary | ICD-10-CM | POA: Diagnosis not present

## 2016-07-31 ENCOUNTER — Encounter (HOSPITAL_COMMUNITY): Payer: Self-pay | Admitting: *Deleted

## 2016-07-31 DIAGNOSIS — N2581 Secondary hyperparathyroidism of renal origin: Secondary | ICD-10-CM | POA: Diagnosis not present

## 2016-07-31 DIAGNOSIS — N186 End stage renal disease: Secondary | ICD-10-CM | POA: Diagnosis not present

## 2016-07-31 DIAGNOSIS — R739 Hyperglycemia, unspecified: Secondary | ICD-10-CM | POA: Diagnosis not present

## 2016-07-31 DIAGNOSIS — E1029 Type 1 diabetes mellitus with other diabetic kidney complication: Secondary | ICD-10-CM | POA: Diagnosis not present

## 2016-07-31 DIAGNOSIS — D509 Iron deficiency anemia, unspecified: Secondary | ICD-10-CM | POA: Diagnosis not present

## 2016-07-31 DIAGNOSIS — D631 Anemia in chronic kidney disease: Secondary | ICD-10-CM | POA: Diagnosis not present

## 2016-07-31 NOTE — Anesthesia Preprocedure Evaluation (Addendum)
Anesthesia Evaluation  Patient identified by MRN, date of birth, ID band Patient awake    Reviewed: Allergy & Precautions, H&P , NPO status , Patient's Chart, lab work & pertinent test results  Airway Mallampati: III  TM Distance: >3 FB Neck ROM: full    Dental no notable dental hx.    Pulmonary neg pulmonary ROS,    Pulmonary exam normal breath sounds clear to auscultation       Cardiovascular Exercise Tolerance: Good hypertension, Pt. on medications and Pt. on home beta blockers Normal cardiovascular exam     Neuro/Psych negative neurological ROS  negative psych ROS   GI/Hepatic Neg liver ROS, GERD  Medicated and Controlled,  Endo/Other  diabetes, Type 1, Insulin Dependent  Renal/GU   negative genitourinary   Musculoskeletal   Abdominal Normal abdominal exam  (+)   Peds  Hematology   Anesthesia Other Findings   Reproductive/Obstetrics negative OB ROS                           Anesthesia Physical Anesthesia Plan  ASA: III  Anesthesia Plan: MAC   Post-op Pain Management:    Induction:   Airway Management Planned: Nasal Cannula, Simple Face Mask and Natural Airway  Additional Equipment:   Intra-op Plan:   Post-operative Plan:   Informed Consent: I have reviewed the patients History and Physical, chart, labs and discussed the procedure including the risks, benefits and alternatives for the proposed anesthesia with the patient or authorized representative who has indicated his/her understanding and acceptance.     Plan Discussed with: CRNA and Surgeon  Anesthesia Plan Comments:        Anesthesia Quick Evaluation

## 2016-07-31 NOTE — Progress Notes (Signed)
Pt denies SOB, chest pain, and being under the care of a cardiologist. Pt denies having a cardiac cath. Pt denies having a chest x ray and EKG within the last year. Pt made aware to stop taking vitamins, fish oil and herbal medications. Do not take any NSAIDs ie: Ibuprofen, Advil, Naproxen, BC and Goody Powder. Pt stated that fasting blood glucose ranges in the 120's. Pt made aware of diabetes protocol to check blood glucose (BG) every 2 hours prior to arrival, interventions for a BG <70 ( 4 Glucose tabs or gel or 4 oz. of Apple or Cranberry Juice only) and > 220 ( half of correction dose of Novolog Insulin ) the morning of procedure. Pt made aware to take (80% /Type 1) 8 units of Lantus insulin tonight instead of 10 units. Pt verbalized understanding of all pre-op instructions.

## 2016-08-01 ENCOUNTER — Encounter (HOSPITAL_COMMUNITY)
Admission: RE | Disposition: A | Discharge status: Home / Self Care | Payer: Self-pay | Source: Ambulatory Visit | Attending: Vascular Surgery

## 2016-08-01 ENCOUNTER — Ambulatory Visit (HOSPITAL_COMMUNITY): Payer: Medicare Other | Admitting: Anesthesiology

## 2016-08-01 ENCOUNTER — Ambulatory Visit (HOSPITAL_COMMUNITY)
Admission: RE | Admit: 2016-08-01 | Discharge: 2016-08-01 | Disposition: A | Discharge status: Home / Self Care | Payer: Medicare Other | Source: Ambulatory Visit | Attending: Vascular Surgery | Admitting: Vascular Surgery

## 2016-08-01 ENCOUNTER — Encounter (HOSPITAL_COMMUNITY): Payer: Self-pay | Admitting: *Deleted

## 2016-08-01 DIAGNOSIS — M1990 Unspecified osteoarthritis, unspecified site: Secondary | ICD-10-CM | POA: Insufficient documentation

## 2016-08-01 DIAGNOSIS — N186 End stage renal disease: Secondary | ICD-10-CM | POA: Diagnosis not present

## 2016-08-01 DIAGNOSIS — Y828 Other medical devices associated with adverse incidents: Secondary | ICD-10-CM | POA: Diagnosis not present

## 2016-08-01 DIAGNOSIS — E1051 Type 1 diabetes mellitus with diabetic peripheral angiopathy without gangrene: Secondary | ICD-10-CM | POA: Diagnosis not present

## 2016-08-01 DIAGNOSIS — Z992 Dependence on renal dialysis: Secondary | ICD-10-CM | POA: Insufficient documentation

## 2016-08-01 DIAGNOSIS — K219 Gastro-esophageal reflux disease without esophagitis: Secondary | ICD-10-CM | POA: Diagnosis not present

## 2016-08-01 DIAGNOSIS — E785 Hyperlipidemia, unspecified: Secondary | ICD-10-CM | POA: Diagnosis not present

## 2016-08-01 DIAGNOSIS — Z91048 Other nonmedicinal substance allergy status: Secondary | ICD-10-CM | POA: Diagnosis not present

## 2016-08-01 DIAGNOSIS — T82591A Other mechanical complication of surgically created arteriovenous shunt, initial encounter: Secondary | ICD-10-CM | POA: Insufficient documentation

## 2016-08-01 DIAGNOSIS — Z7982 Long term (current) use of aspirin: Secondary | ICD-10-CM | POA: Diagnosis not present

## 2016-08-01 DIAGNOSIS — T82898A Other specified complication of vascular prosthetic devices, implants and grafts, initial encounter: Secondary | ICD-10-CM | POA: Diagnosis not present

## 2016-08-01 DIAGNOSIS — T82590A Other mechanical complication of surgically created arteriovenous fistula, initial encounter: Secondary | ICD-10-CM | POA: Diagnosis not present

## 2016-08-01 DIAGNOSIS — E1022 Type 1 diabetes mellitus with diabetic chronic kidney disease: Secondary | ICD-10-CM | POA: Insufficient documentation

## 2016-08-01 DIAGNOSIS — Z794 Long term (current) use of insulin: Secondary | ICD-10-CM | POA: Diagnosis not present

## 2016-08-01 DIAGNOSIS — I120 Hypertensive chronic kidney disease with stage 5 chronic kidney disease or end stage renal disease: Secondary | ICD-10-CM | POA: Insufficient documentation

## 2016-08-01 DIAGNOSIS — E10319 Type 1 diabetes mellitus with unspecified diabetic retinopathy without macular edema: Secondary | ICD-10-CM | POA: Insufficient documentation

## 2016-08-01 DIAGNOSIS — E119 Type 2 diabetes mellitus without complications: Secondary | ICD-10-CM | POA: Diagnosis not present

## 2016-08-01 HISTORY — DX: Pneumonia, unspecified organism: J18.9

## 2016-08-01 HISTORY — PX: REVISION OF ARTERIOVENOUS GORETEX GRAFT: SHX6073

## 2016-08-01 LAB — POCT I-STAT 4, (NA,K, GLUC, HGB,HCT)
Glucose, Bld: 212 mg/dL — ABNORMAL HIGH (ref 65–99)
HCT: 42 % (ref 36.0–46.0)
Hemoglobin: 14.3 g/dL (ref 12.0–15.0)
Potassium: 4.1 mmol/L (ref 3.5–5.1)
Sodium: 137 mmol/L (ref 135–145)

## 2016-08-01 LAB — GLUCOSE, CAPILLARY
GLUCOSE-CAPILLARY: 231 mg/dL — AB (ref 65–99)
Glucose-Capillary: 233 mg/dL — ABNORMAL HIGH (ref 65–99)

## 2016-08-01 LAB — HCG, SERUM, QUALITATIVE: Preg, Serum: NEGATIVE

## 2016-08-01 SURGERY — REVISION OF ARTERIOVENOUS GORETEX GRAFT
Anesthesia: Monitor Anesthesia Care | Site: Arm Upper | Laterality: Left

## 2016-08-01 MED ORDER — ONDANSETRON HCL 4 MG/2ML IJ SOLN: 4.0000 mg | Freq: Once | INTRAMUSCULAR | Status: DC | PRN

## 2016-08-01 MED ORDER — FENTANYL CITRATE (PF) 100 MCG/2ML IJ SOLN
INTRAMUSCULAR | Status: AC
  Filled 2016-08-01: qty 2

## 2016-08-01 MED ORDER — LIDOCAINE-EPINEPHRINE (PF) 1 %-1:200000 IJ SOLN
INTRAMUSCULAR | Status: AC
  Filled 2016-08-01: qty 30

## 2016-08-01 MED ORDER — HEPARIN SODIUM (PORCINE) 1000 UNIT/ML IJ SOLN
INTRAMUSCULAR | Status: DC | PRN
  Administered 2016-08-01: 5000 [IU] via INTRAVENOUS

## 2016-08-01 MED ORDER — OXYCODONE HCL 5 MG PO TABS
5.0000 mg | ORAL_TABLET | Freq: Once | ORAL | Status: AC | PRN
  Administered 2016-08-01: 5 mg via ORAL

## 2016-08-01 MED ORDER — INSULIN ASPART 100 UNIT/ML ~~LOC~~ SOLN
SUBCUTANEOUS | Status: AC
  Filled 2016-08-01: qty 1

## 2016-08-01 MED ORDER — ACETAMINOPHEN 325 MG PO TABS: 325.0000 mg | ORAL_TABLET | ORAL | Status: DC | PRN

## 2016-08-01 MED ORDER — FENTANYL CITRATE (PF) 100 MCG/2ML IJ SOLN
INTRAMUSCULAR | Status: DC | PRN
  Administered 2016-08-01: 50 ug via INTRAVENOUS
  Administered 2016-08-01 (×2): 25 ug via INTRAVENOUS

## 2016-08-01 MED ORDER — ONDANSETRON HCL 4 MG/2ML IJ SOLN
INTRAMUSCULAR | Status: AC
  Filled 2016-08-01: qty 2

## 2016-08-01 MED ORDER — PROTAMINE SULFATE 10 MG/ML IV SOLN
INTRAVENOUS | Status: DC | PRN
  Administered 2016-08-01: 30 mg via INTRAVENOUS

## 2016-08-01 MED ORDER — SODIUM CHLORIDE 0.9 % IV SOLN
INTRAVENOUS | Status: DC | PRN
  Administered 2016-08-01: 08:00:00

## 2016-08-01 MED ORDER — PROTAMINE SULFATE 10 MG/ML IV SOLN
INTRAVENOUS | Status: AC
  Filled 2016-08-01: qty 5

## 2016-08-01 MED ORDER — INSULIN ASPART 100 UNIT/ML ~~LOC~~ SOLN
2.0000 [IU] | Freq: Once | SUBCUTANEOUS | Status: AC
  Administered 2016-08-01: 2 [IU] via SUBCUTANEOUS

## 2016-08-01 MED ORDER — 0.9 % SODIUM CHLORIDE (POUR BTL) OPTIME
TOPICAL | Status: DC | PRN
Start: 2016-08-01 — End: 2016-08-01
  Administered 2016-08-01: 1000 mL

## 2016-08-01 MED ORDER — LIDOCAINE HCL (PF) 1 % IJ SOLN
INTRAMUSCULAR | Status: AC
  Filled 2016-08-01: qty 30

## 2016-08-01 MED ORDER — CHLORHEXIDINE GLUCONATE CLOTH 2 % EX PADS: 6.0000 | MEDICATED_PAD | Freq: Once | CUTANEOUS | Status: DC

## 2016-08-01 MED ORDER — ONDANSETRON HCL 4 MG/2ML IJ SOLN
INTRAMUSCULAR | Status: DC | PRN
  Administered 2016-08-01: 4 mg via INTRAVENOUS

## 2016-08-01 MED ORDER — HEPARIN SODIUM (PORCINE) 1000 UNIT/ML IJ SOLN
INTRAMUSCULAR | Status: AC
  Filled 2016-08-01: qty 1

## 2016-08-01 MED ORDER — SODIUM CHLORIDE 0.9 % IV SOLN
INTRAVENOUS | Status: DC
  Administered 2016-08-01 (×2): via INTRAVENOUS

## 2016-08-01 MED ORDER — OXYCODONE HCL 5 MG PO TABS
ORAL_TABLET | ORAL | Status: AC
  Filled 2016-08-01: qty 1

## 2016-08-01 MED ORDER — ACETAMINOPHEN 160 MG/5ML PO SOLN: 325.0000 mg | ORAL | Status: DC | PRN

## 2016-08-01 MED ORDER — PROPOFOL 10 MG/ML IV BOLUS
INTRAVENOUS | Status: DC | PRN
  Administered 2016-08-01: 20 mg via INTRAVENOUS
  Administered 2016-08-01: 30 mg via INTRAVENOUS
  Administered 2016-08-01: 20 mg via INTRAVENOUS

## 2016-08-01 MED ORDER — PHENYLEPHRINE 40 MCG/ML (10ML) SYRINGE FOR IV PUSH (FOR BLOOD PRESSURE SUPPORT)
PREFILLED_SYRINGE | INTRAVENOUS | Status: AC
  Filled 2016-08-01: qty 10

## 2016-08-01 MED ORDER — PROPOFOL 10 MG/ML IV BOLUS
INTRAVENOUS | Status: AC
  Filled 2016-08-01: qty 20

## 2016-08-01 MED ORDER — DEXTROSE 5 % IV SOLN
1.5000 g | INTRAVENOUS | Status: AC
  Administered 2016-08-01: 1.5 g via INTRAVENOUS
  Filled 2016-08-01: qty 1.5

## 2016-08-01 MED ORDER — PROPOFOL 500 MG/50ML IV EMUL
INTRAVENOUS | Status: DC | PRN
  Administered 2016-08-01: 125 ug/kg/min via INTRAVENOUS

## 2016-08-01 MED ORDER — OXYCODONE-ACETAMINOPHEN 5-325 MG PO TABS: 1.0000 | ORAL_TABLET | Freq: Four times a day (QID) | ORAL | 0 refills | Status: DC | PRN

## 2016-08-01 MED ORDER — OXYCODONE HCL 5 MG/5ML PO SOLN: 5.0000 mg | Freq: Once | ORAL | Status: AC | PRN

## 2016-08-01 MED ORDER — FENTANYL CITRATE (PF) 100 MCG/2ML IJ SOLN: 25.0000 ug | INTRAMUSCULAR | Status: DC | PRN

## 2016-08-01 MED ORDER — MEPERIDINE HCL 25 MG/ML IJ SOLN: 6.2500 mg | INTRAMUSCULAR | Status: DC | PRN

## 2016-08-01 MED ORDER — LIDOCAINE-EPINEPHRINE (PF) 1 %-1:200000 IJ SOLN
INTRAMUSCULAR | Status: DC | PRN
  Administered 2016-08-01: 30 mL

## 2016-08-01 SURGICAL SUPPLY — 31 items
CANISTER SUCTION 2500CC (MISCELLANEOUS) ×2 IMPLANT
CANNULA VESSEL 3MM 2 BLNT TIP (CANNULA) ×2 IMPLANT
CATH EMB 4FR 80CM (CATHETERS) ×2 IMPLANT
CLIP TI MEDIUM 6 (CLIP) ×2 IMPLANT
CLIP TI WIDE RED SMALL 6 (CLIP) ×4 IMPLANT
DECANTER SPIKE VIAL GLASS SM (MISCELLANEOUS) IMPLANT
DERMABOND ADVANCED (GAUZE/BANDAGES/DRESSINGS) ×2
DERMABOND ADVANCED .7 DNX12 (GAUZE/BANDAGES/DRESSINGS) ×2 IMPLANT
ELECT REM PT RETURN 9FT ADLT (ELECTROSURGICAL) ×2
ELECTRODE REM PT RTRN 9FT ADLT (ELECTROSURGICAL) ×1 IMPLANT
GLOVE BIO SURGEON STRL SZ7.5 (GLOVE) ×2 IMPLANT
GLOVE BIOGEL PI IND STRL 6.5 (GLOVE) ×1 IMPLANT
GLOVE BIOGEL PI IND STRL 8 (GLOVE) ×1 IMPLANT
GLOVE BIOGEL PI INDICATOR 6.5 (GLOVE) ×1
GLOVE BIOGEL PI INDICATOR 8 (GLOVE) ×1
GLOVE SURG SS PI 6.5 STRL IVOR (GLOVE) ×2 IMPLANT
GOWN STRL REUS W/ TWL LRG LVL3 (GOWN DISPOSABLE) ×4 IMPLANT
GOWN STRL REUS W/TWL LRG LVL3 (GOWN DISPOSABLE) ×4
KIT BASIN OR (CUSTOM PROCEDURE TRAY) ×2 IMPLANT
KIT ROOM TURNOVER OR (KITS) ×2 IMPLANT
NEEDLE HYPO 25GX1X1/2 BEV (NEEDLE) ×2 IMPLANT
NS IRRIG 1000ML POUR BTL (IV SOLUTION) ×2 IMPLANT
PACK CV ACCESS (CUSTOM PROCEDURE TRAY) ×2 IMPLANT
PAD ARMBOARD 7.5X6 YLW CONV (MISCELLANEOUS) ×4 IMPLANT
SPONGE SURGIFOAM ABS GEL 100 (HEMOSTASIS) IMPLANT
SUT PROLENE 6 0 BV (SUTURE) ×6 IMPLANT
SUT VIC AB 3-0 SH 27 (SUTURE) ×2
SUT VIC AB 3-0 SH 27X BRD (SUTURE) ×2 IMPLANT
SUT VICRYL 4-0 PS2 18IN ABS (SUTURE) ×4 IMPLANT
UNDERPAD 30X30 (UNDERPADS AND DIAPERS) ×2 IMPLANT
WATER STERILE IRR 1000ML POUR (IV SOLUTION) ×2 IMPLANT

## 2016-08-01 NOTE — Interval H&P Note (Signed)
History and Physical Interval Note:  08/01/2016 7:07 AM  [name]  has presented today for surgery, with the diagnosis of End Stage Renal Disease N18.6; Poorly functioning left arm arteriovenous graft T82.591A  The various methods of treatment have been discussed with the patient and family. After consideration of risks, benefits and other options for treatment, the patient has consented to  Procedure(s): REVISION OF ARTERIOVENOUS GORETEX GRAFT (Left) as a surgical intervention .  The patient's history has been reviewed, patient examined, no change in status, stable for surgery.  I have reviewed the patient's chart and labs.  Questions were answered to the patient's satisfaction.     [name]

## 2016-08-01 NOTE — Op Note (Signed)
    NAME: AKERIA HEDSTROM   MRN: [id number] DOB: 30-Nov-1975    DATE OF OPERATION: 08/01/2016  PREOP DIAGNOSIS: Poorly functioning left upper arm AV graft  POSTOP DIAGNOSIS: Same  PROCEDURE: Revision of left upper arm AV graft  SURGEON: Judeth Cornfield. Scot Dock, MD, FACS  ASSIST: Silva Bandy, [facility]  ANESTHESIA: Local with sedation   EBL: Minimal  INDICATIONS: [name] is a 41 y.o. female who presents with a poorly functioning left upper arm graft. Fistulogram performed by [facility] showed occlusion of the outflow vein with maintenance of patency of the graft through collaterals. There appeared to be an adjacent axillary vein and we were asked to revise the graft into the adjacent vein in order to try to salvage the graft.  FINDINGS: The adjacent vein was small but the thrill in the graft appeared to improve after the revision.  TECHNIQUE: The patient was taken to the operating room and sedated by anesthesia. The left upper extremity was prepped and draped in the usual sterile fashion. Both the arterial and venous anastomosis were quite high in the axilla. I therefore made a transverse incision at the axilla after the skin was anesthetized. The venous limb of the graft was dissected free up to where it was anastomosed to the axillary vein. Centrally the vein was occluded but there was retrograde flow distally maintaining patency of the graft which was pulsatile. I dissected into a deeper space deep to the adjacent nerve and did fine and adjacent axillary vein. This was about a 3.5 mm vein. The patient was heparinized. The graft was ligated and then the adjacent axillary vein ligated distally and spatulated. The graft was spatulated and sewn end to end to the adjacent axillary vein using 2 continuous 6-0 Prolene sutures. At the completion the graft was still pulsatile but there was more of a thrill than previously. Hemostasis was obtained in the wound. The heparin was partially reversed with  protamine. The wound was closed with a deep layer of 3-0 Vicryl and the skin closed with 4-0 Vicryl.  CLINICAL NOTE: If this graft clots in the future she will need to have new access in the right arm and a catheter. No further revisions on this graft would be possible.  [name], MD, FACS [facility]  DATE OF DICTATION:   08/01/2016

## 2016-08-01 NOTE — H&P (View-Only) (Signed)
Patient name: [name] MRN: [id number] DOB: [birth year] Sex: female  REASON FOR CONSULT: Evaluate for possible revision of left upper arm graft.  HPI: [name] is a 41 y.o. female, who underwent a fistulogram by [facility] on 06/23/2016. This showed a chronically occluded axillary vein. We are asked to consider her for a revision. She states that the graft has been working recently well. She dialyzes on Tuesdays Thursdays and Saturday. She has had some bleeding episodes when they cannulate the aneurysmal segment along the lateral aspect of her upper arm.  Past Medical History:  Diagnosis Date  . Anemia   . Arthritis   . Diabetes mellitus   . Diabetic retinopathy ([place])    Hx of laser Rx's  . DM type 1 (diabetes mellitus, type 1) ([place]) 09/12/2014   Age of onset for DM type 1 was age 26.     Marland Kitchen ESRD on hemodialysis ([place])    ESRD due to DM type I, age of onset DM 1 was age 35.  Went on dialysis in March 2012.  Gets HD now at [facility] on a TTS schedule.    Marland Kitchen ESRD on hemodialysis ([place]) 09/12/2014   ESRD due to DM type 1.  Started HD in 2012 at [facility].  Gets HD there on a TTS schedule.    Marland Kitchen GERD (gastroesophageal reflux disease)   . History of blood transfusion   . Hyperlipidemia   . Hypertension   . Peripheral vascular disease ([place])     Family History  Problem Relation Age of Onset  . Cancer Mother     breast  . Hypertension Father     SOCIAL HISTORY: Social History   Social History  . Marital status: Single    Spouse name: N/A  . Number of children: 1  . Years of education: [employer]HEL[employer]'S   Occupational History  . Not on file.   Social History Main Topics  . Smoking status: Never Smoker  . Smokeless tobacco: Never Used  . Alcohol use No  . Drug use: No  . Sexual activity: Not on file   Other Topics Concern  . Not on file   Social History Narrative   ** Merged History Encounter **       Patient is single with 1 child. Patient is right  handed. Patient has BS degree. Patient drinks 0 caffeine.    Allergies  Allergen Reactions  . Pollen Extract Other (See Comments)    Watery eyes    Current Outpatient Prescriptions  Medication Sig Dispense Refill  . amLODipine (NORVASC) 10 MG tablet Take 10 mg by mouth daily.     Marland Kitchen aspirin 81 MG EC tablet Take 81 mg by mouth daily. Swallow whole.    . calcium acetate (PHOSLO) 667 MG capsule Take 667 mg by mouth 3 (three) times daily with meals.      . cinacalcet (SENSIPAR) 30 MG tablet Take 30 mg by mouth daily.    Marland Kitchen GLUCAGON EMERGENCY 1 MG injection Inject 1 mg into the muscle once as needed (for severe low blood sugar).     . insulin aspart (NOVOLOG) 100 UNIT/ML FlexPen Inject 5-15 Units into the skin See admin instructions. Based on sliding scale    . insulin glargine (LANTUS) 100 UNIT/ML injection Inject 10 Units into the skin at bedtime.     Marland Kitchen labetalol (NORMODYNE) 300 MG tablet Take 300 mg by mouth 3 (three) times daily.    . ondansetron [facility]  ODT) 4 MG disintegrating tablet 4mg  ODT q4 hours prn nausea/vomit (Patient taking differently: Take 4 mg by mouth every 4 (four) hours as needed for nausea. 4mg  ODT q4 hours prn nausea/vomit) 4 tablet 0  . Pediatric Multiple Vit-C-FA (FLINSTONES GUMMIES OMEGA-3 DHA) CHEW Chew 1 tablet by mouth daily.     . rosuvastatin (CRESTOR) 10 MG tablet Take 10 mg by mouth daily.     . valsartan (DIOVAN) 160 MG tablet Take 160 mg by mouth daily.  6  . amoxicillin-clavulanate (AUGMENTIN) 875-125 MG per tablet Take 1 tablet by mouth every 12 (twelve) hours. (Patient not taking: Reported on 07/23/2016) 12 tablet 0  . aspirin 325 MG tablet Take 1 tablet (325 mg total) by mouth daily. (Patient not taking: Reported on 07/23/2016)    . fluticasone (FLONASE) 50 MCG/ACT nasal spray Place 1 spray into both nostrils daily. (Patient not taking: Reported on 07/23/2016) 16 g 2  . guaiFENesin (MUCINEX) 600 MG 12 hr tablet Take 1 tablet (600 mg total) by mouth 2 (two) times  daily. (Patient not taking: Reported on 07/23/2016) 30 tablet 0   No current facility-administered medications for this visit.     REVIEW OF SYSTEMS:  [X]  denotes positive finding, [ ]  denotes negative finding Cardiac  Comments:  Chest pain or chest pressure:    Shortness of breath upon exertion:    Short of breath when lying flat:    Irregular heart rhythm:        Vascular    Pain in calf, thigh, or hip brought on by ambulation:    Pain in feet at night that wakes you up from your sleep:     Blood clot in your veins:    Leg swelling:         Pulmonary    Oxygen at home:    Productive cough:     Wheezing:         Neurologic    Sudden weakness in arms or legs:     Sudden numbness in arms or legs:     Sudden onset of difficulty speaking or slurred speech:    Temporary loss of vision in one eye:     Problems with dizziness:         Gastrointestinal    Blood in stool:     Vomited blood:         Genitourinary    Burning when urinating:     Blood in urine:        Psychiatric    Major depression:         Hematologic    Bleeding problems:    Problems with blood clotting too easily:        Skin    Rashes or ulcers:        Constitutional    Fever or chills:      PHYSICAL EXAM: Vitals:   07/23/16 1155 07/23/16 1200  BP: (!) 178/89 (!) 176/87  Pulse: 87   Resp: 18   Temp: 97.6 F (36.4 C)   TempSrc: Oral   SpO2: 98%   Weight: 119 lb 8 oz (54.2 kg)   Height: 5' 3.5" (1.613 m)     GENERAL: The patient is a well-nourished female, in no acute distress. The vital signs are documented above. CARDIAC: There is a regular rate and rhythm.  VASCULAR: She has a palpable left radial pulse. Her upper arm graft is pulsatile. She has an upper arm loop graft in the venous limb  of the graft is along the lateral aspect of the upper arm. PULMONARY: There is good air exchange bilaterally without wheezing or rales. ABDOMEN: Soft and non-tender with normal pitched bowel sounds.    MUSCULOSKELETAL: There are no major deformities or cyanosis. NEUROLOGIC: No focal weakness or paresthesias are detected. SKIN: There are no ulcers or rashes noted. PSYCHIATRIC: The patient has a normal affect.  DATA:   I have reviewed the images of the fistulogram that was sent with the patient. The patient has an acute axillary vein. The upper arm graft has aneurysmal areas and is somewhat degenerative. There do appear to be patent adjacent veins in the axilla which could potentially be used for outflow.  MEDICAL ISSUES:  POORLY FUNCTIONING LEFT UPPER ARM AV GRAFT: I think that it would be reasonable to attempt revision of this graft and try to find an adjacent vein to revise the venous anastomosis. If this were not successful then I would have to place a catheter and we would have to evaluate her for new access. However there does appear to be an adjacent vein in the axilla which could be used for outflow.  Her surgery is scheduled for 08/01/2016. We have discussed the procedure and risks and she is agreeable to proceed.   [name] [facility] [place] [phone]

## 2016-08-01 NOTE — Transfer of Care (Signed)
Immediate Anesthesia Transfer of Care Note  Patient: [name]  Procedure(s) Performed: Procedure(s): REVISION OF ARTERIOVENOUS GORETEX GRAFT (Left)  Patient Location: PACU  Anesthesia Type:MAC  Level of Consciousness: awake  Airway & Oxygen Therapy: Patient Spontanous Breathing  Post-op Assessment: Report given to RN and Post -op Vital signs reviewed and stable  Post vital signs: Reviewed and stable  Last Vitals:  Vitals:   08/01/16 0634 08/01/16 0928  BP: (!) 165/89 95/62  Pulse: 79 79  Resp: 20 10  Temp: 37.1 C 36.6 C    Last Pain:  Vitals:   08/01/16 0634  TempSrc: Oral      Patients Stated Pain Goal: 9 (56/38/75 6433)  Complications: No apparent anesthesia complications

## 2016-08-02 DIAGNOSIS — N2581 Secondary hyperparathyroidism of renal origin: Secondary | ICD-10-CM | POA: Diagnosis not present

## 2016-08-02 DIAGNOSIS — E1029 Type 1 diabetes mellitus with other diabetic kidney complication: Secondary | ICD-10-CM | POA: Diagnosis not present

## 2016-08-02 DIAGNOSIS — D509 Iron deficiency anemia, unspecified: Secondary | ICD-10-CM | POA: Diagnosis not present

## 2016-08-02 DIAGNOSIS — R739 Hyperglycemia, unspecified: Secondary | ICD-10-CM | POA: Diagnosis not present

## 2016-08-02 DIAGNOSIS — D631 Anemia in chronic kidney disease: Secondary | ICD-10-CM | POA: Diagnosis not present

## 2016-08-02 DIAGNOSIS — N186 End stage renal disease: Secondary | ICD-10-CM | POA: Diagnosis not present

## 2016-08-03 NOTE — Anesthesia Postprocedure Evaluation (Addendum)
Anesthesia Post Note  Patient: [name]  Procedure(s) Performed: Procedure(s) (LRB): REVISION OF ARTERIOVENOUS GORETEX GRAFT (Left)  Patient location during evaluation: PACU Anesthesia Type: MAC Level of consciousness: awake Pain management: pain level controlled Vital Signs Assessment: post-procedure vital signs reviewed and stable Respiratory status: spontaneous breathing Cardiovascular status: stable Postop Assessment: no signs of nausea or vomiting Anesthetic complications: no        Last Vitals:  Vitals:   08/01/16 1100 08/01/16 1130  BP:  104/76  Pulse: 79 79  Resp: 12   Temp: 36.9 C     Last Pain:  Vitals:   08/01/16 1000  TempSrc:   PainSc: 0-No pain   Pain Goal: Patients Stated Pain Goal: 9 (08/01/16 0634)               Raini Tiley JR,JOHN Mateo Flow

## 2016-08-04 ENCOUNTER — Encounter (HOSPITAL_COMMUNITY): Payer: Self-pay | Admitting: Vascular Surgery

## 2016-08-05 DIAGNOSIS — R739 Hyperglycemia, unspecified: Secondary | ICD-10-CM | POA: Diagnosis not present

## 2016-08-05 DIAGNOSIS — N2581 Secondary hyperparathyroidism of renal origin: Secondary | ICD-10-CM | POA: Diagnosis not present

## 2016-08-05 DIAGNOSIS — D509 Iron deficiency anemia, unspecified: Secondary | ICD-10-CM | POA: Diagnosis not present

## 2016-08-05 DIAGNOSIS — N186 End stage renal disease: Secondary | ICD-10-CM | POA: Diagnosis not present

## 2016-08-05 DIAGNOSIS — D631 Anemia in chronic kidney disease: Secondary | ICD-10-CM | POA: Diagnosis not present

## 2016-08-05 DIAGNOSIS — E1029 Type 1 diabetes mellitus with other diabetic kidney complication: Secondary | ICD-10-CM | POA: Diagnosis not present

## 2016-08-07 DIAGNOSIS — E1029 Type 1 diabetes mellitus with other diabetic kidney complication: Secondary | ICD-10-CM | POA: Diagnosis not present

## 2016-08-07 DIAGNOSIS — N2581 Secondary hyperparathyroidism of renal origin: Secondary | ICD-10-CM | POA: Diagnosis not present

## 2016-08-07 DIAGNOSIS — D631 Anemia in chronic kidney disease: Secondary | ICD-10-CM | POA: Diagnosis not present

## 2016-08-07 DIAGNOSIS — D509 Iron deficiency anemia, unspecified: Secondary | ICD-10-CM | POA: Diagnosis not present

## 2016-08-07 DIAGNOSIS — N186 End stage renal disease: Secondary | ICD-10-CM | POA: Diagnosis not present

## 2016-08-07 DIAGNOSIS — R739 Hyperglycemia, unspecified: Secondary | ICD-10-CM | POA: Diagnosis not present

## 2016-08-08 DIAGNOSIS — H35373 Puckering of macula, bilateral: Secondary | ICD-10-CM | POA: Diagnosis not present

## 2016-08-08 DIAGNOSIS — H25042 Posterior subcapsular polar age-related cataract, left eye: Secondary | ICD-10-CM | POA: Diagnosis not present

## 2016-08-08 DIAGNOSIS — H25013 Cortical age-related cataract, bilateral: Secondary | ICD-10-CM | POA: Diagnosis not present

## 2016-08-08 DIAGNOSIS — H5213 Myopia, bilateral: Secondary | ICD-10-CM | POA: Diagnosis not present

## 2016-08-08 DIAGNOSIS — E103593 Type 1 diabetes mellitus with proliferative diabetic retinopathy without macular edema, bilateral: Secondary | ICD-10-CM | POA: Diagnosis not present

## 2016-08-08 DIAGNOSIS — H2513 Age-related nuclear cataract, bilateral: Secondary | ICD-10-CM | POA: Diagnosis not present

## 2016-08-08 DIAGNOSIS — Z794 Long term (current) use of insulin: Secondary | ICD-10-CM | POA: Diagnosis not present

## 2016-08-09 DIAGNOSIS — D631 Anemia in chronic kidney disease: Secondary | ICD-10-CM | POA: Diagnosis not present

## 2016-08-09 DIAGNOSIS — E1029 Type 1 diabetes mellitus with other diabetic kidney complication: Secondary | ICD-10-CM | POA: Diagnosis not present

## 2016-08-09 DIAGNOSIS — N2581 Secondary hyperparathyroidism of renal origin: Secondary | ICD-10-CM | POA: Diagnosis not present

## 2016-08-09 DIAGNOSIS — R739 Hyperglycemia, unspecified: Secondary | ICD-10-CM | POA: Diagnosis not present

## 2016-08-09 DIAGNOSIS — D509 Iron deficiency anemia, unspecified: Secondary | ICD-10-CM | POA: Diagnosis not present

## 2016-08-09 DIAGNOSIS — N186 End stage renal disease: Secondary | ICD-10-CM | POA: Diagnosis not present

## 2016-08-11 ENCOUNTER — Telehealth: Payer: Self-pay

## 2016-08-11 DIAGNOSIS — M79645 Pain in left finger(s): Secondary | ICD-10-CM

## 2016-08-11 DIAGNOSIS — Z992 Dependence on renal dialysis: Secondary | ICD-10-CM

## 2016-08-11 DIAGNOSIS — N186 End stage renal disease: Secondary | ICD-10-CM

## 2016-08-11 DIAGNOSIS — R200 Anesthesia of skin: Secondary | ICD-10-CM

## 2016-08-11 NOTE — Telephone Encounter (Signed)
Phone call from pt.  Reported swelling in the left UA AVG site; noted that this became more prominent last Thurs., 1/18.  Denied redness or warmth in the surrounding tissue.  C/o numbness of the thumb, index and middle finger.  Denied any incisional drainage, or fever/ chills.   Reported there is a pain pulsation in the tips of above mentioned digits.  Reported unable to grip with the left hand due to lack of feeling of thumb, index and middle fingers.  Advised that a Scheduler will call back with appt. Information.  Agreed.

## 2016-08-12 ENCOUNTER — Other Ambulatory Visit: Payer: Self-pay | Admitting: *Deleted

## 2016-08-12 DIAGNOSIS — E1029 Type 1 diabetes mellitus with other diabetic kidney complication: Secondary | ICD-10-CM | POA: Diagnosis not present

## 2016-08-12 DIAGNOSIS — D631 Anemia in chronic kidney disease: Secondary | ICD-10-CM | POA: Diagnosis not present

## 2016-08-12 DIAGNOSIS — R739 Hyperglycemia, unspecified: Secondary | ICD-10-CM | POA: Diagnosis not present

## 2016-08-12 DIAGNOSIS — N2581 Secondary hyperparathyroidism of renal origin: Secondary | ICD-10-CM | POA: Diagnosis not present

## 2016-08-12 DIAGNOSIS — N186 End stage renal disease: Secondary | ICD-10-CM | POA: Diagnosis not present

## 2016-08-12 DIAGNOSIS — T82898D Other specified complication of vascular prosthetic devices, implants and grafts, subsequent encounter: Secondary | ICD-10-CM

## 2016-08-12 DIAGNOSIS — D509 Iron deficiency anemia, unspecified: Secondary | ICD-10-CM | POA: Diagnosis not present

## 2016-08-12 NOTE — Telephone Encounter (Signed)
Pt will come in tomorrow at 12 to see pa Pt is aware

## 2016-08-13 ENCOUNTER — Ambulatory Visit (INDEPENDENT_AMBULATORY_CARE_PROVIDER_SITE_OTHER): Payer: Medicare Other | Admitting: Physician Assistant

## 2016-08-13 ENCOUNTER — Ambulatory Visit (HOSPITAL_COMMUNITY)
Admission: RE | Admit: 2016-08-13 | Discharge: 2016-08-13 | Disposition: A | Discharge status: Home / Self Care | Payer: Medicare Other | Source: Ambulatory Visit | Attending: Surgery | Admitting: Surgery

## 2016-08-13 ENCOUNTER — Encounter: Payer: Self-pay | Admitting: *Deleted

## 2016-08-13 VITALS — BP 135/78 | HR 81 | Temp 97.6°F | Resp 14 | Ht 65.0 in | Wt 113.0 lb

## 2016-08-13 DIAGNOSIS — T82898D Other specified complication of vascular prosthetic devices, implants and grafts, subsequent encounter: Secondary | ICD-10-CM | POA: Diagnosis not present

## 2016-08-13 DIAGNOSIS — N186 End stage renal disease: Secondary | ICD-10-CM

## 2016-08-13 DIAGNOSIS — Z992 Dependence on renal dialysis: Secondary | ICD-10-CM

## 2016-08-13 DIAGNOSIS — X58XXXD Exposure to other specified factors, subsequent encounter: Secondary | ICD-10-CM | POA: Insufficient documentation

## 2016-08-13 NOTE — Progress Notes (Signed)
History of Present Illness:  Patient is a 41 y.o. year old female who presents with a chief complaint of left median nerve distribution pain that has gotten progressively worse since her surgery to revise her left UE AV graft.  She has to take percocet to help with the pain.  The pain is no better and no worse on HD.  She underwent a fistulogram by [facility] on 06/23/2016. This showed a chronically occluded axillary vein. She has reported  Venous side clotting when on HD.    No change in medical history since her last visit.  She takes Norvasc and Labetalol for HTN, Crestor for hypercholesterolemia and manages her DM with Insulin.    Past Medical History:  Diagnosis Date  . Anemia   . Arthritis   . Diabetes mellitus   . Diabetic retinopathy ([place])    Hx of laser Rx's  . DM type 1 (diabetes mellitus, type 1) ([place]) 09/12/2014   Age of onset for DM type 1 was age 84.     Marland Kitchen ESRD on hemodialysis ([place])    ESRD due to DM type I, age of onset DM 1 was age 33.  Went on dialysis in March 2012.  Gets HD now at [facility] on a TTS schedule.    Marland Kitchen ESRD on hemodialysis ([place]) 09/12/2014   ESRD due to DM type 1.  Started HD in 2012 at [facility].  Gets HD there on a TTS schedule.    Marland Kitchen GERD (gastroesophageal reflux disease)   . History of blood transfusion   . Hyperlipidemia   . Hypertension   . Peripheral vascular disease ([place])   . Pneumonia     Past Surgical History:  Procedure Laterality Date  . ARTERIOVENOUS GRAFT PLACEMENT    . ARTERY REPAIR Left 12/10/2012   Procedure: BRACHIAL ARTERY REPAIR;  Surgeon: Angelia Mould, MD;  Location: [facility] OR;  Service: Vascular;  Laterality: Left;  Exploration Left Brachial Artery for AVF  . CESAREAN SECTION    . EYE SURGERY     [place]  . REVISION OF ARTERIOVENOUS GORETEX GRAFT Left 08/01/2016   Procedure: REVISION OF ARTERIOVENOUS GORETEX GRAFT;  Surgeon: Angelia Mould, MD;  Location: [facility];  Service: Vascular;  Laterality: Left;  . SHUNTOGRAM  Left 11/30/2013   Procedure: SHUNTOGRAM;  Surgeon: Serafina Mitchell, MD;  Location: [facility] CATH LAB;  Service: Cardiovascular;  Laterality: Left;     Social History Social History  Substance Use Topics  . Smoking status: Never Smoker  . Smokeless tobacco: Never Used  . Alcohol use No    Family History Family History  Problem Relation Age of Onset  . Cancer Mother     breast  . Hypertension Father     Allergies  Allergies  Allergen Reactions  . Pollen Extract Other (See Comments)    Watery eyes     Current Outpatient Prescriptions  Medication Sig Dispense Refill  . acetaminophen (TYLENOL) 500 MG tablet Take 500 mg by mouth every 6 (six) hours as needed for moderate pain or headache.    Marland Kitchen amLODipine (NORVASC) 10 MG tablet Take 10 mg by mouth daily.     Marland Kitchen aspirin 81 MG EC tablet Take 81 mg by mouth daily. Swallow whole.    . calcium acetate (PHOSLO) 667 MG capsule Take 667 mg by mouth 3 (three) times daily with meals.      . cinacalcet (SENSIPAR) 30 MG tablet Take 30 mg by mouth daily.    Marland Kitchen  famotidine (PEPCID AC) 10 MG chewable tablet Chew 10 mg by mouth daily as needed for heartburn.    Marland Kitchen GLUCAGON EMERGENCY 1 MG injection Inject 1 mg into the muscle once as needed (for severe low blood sugar).     . insulin aspart (NOVOLOG) 100 UNIT/ML FlexPen Inject 3-10 Units into the skin See admin instructions. Based on sliding scale 1 unit per 20g of carbs    . insulin glargine (LANTUS) 100 UNIT/ML injection Inject 10 Units into the skin at bedtime.     Marland Kitchen labetalol (NORMODYNE) 300 MG tablet Take 300 mg by mouth 3 (three) times daily.    Marland Kitchen oxyCODONE-acetaminophen (PERCOCET/ROXICET) 5-325 MG tablet Take 1 tablet by mouth every 6 (six) hours as needed. 6 tablet 0  . Pediatric Multiple Vit-C-FA (FLINSTONES GUMMIES OMEGA-3 DHA) CHEW Chew 1 tablet by mouth daily.     . rosuvastatin (CRESTOR) 10 MG tablet Take 10 mg by mouth every evening.     . valsartan (DIOVAN) 160 MG tablet Take 160 mg by mouth  daily.  6   No current facility-administered medications for this visit.     ROS:   General:  No weight loss, Fever, chills  HEENT: No recent headaches, no nasal bleeding, no visual changes, no sore throat  Neurologic: No dizziness, blackouts, seizures. No recent symptoms of stroke or mini- stroke. No recent episodes of slurred speech, or temporary blindness.  Cardiac: No recent episodes of chest pain/pressure, no shortness of breath at rest.  No shortness of breath with exertion.  Denies history of atrial fibrillation or irregular heartbeat  Vascular: No history of rest pain in feet.  No history of claudication.  No history of non-healing ulcer, No history of DVT   Pulmonary: No home oxygen, no productive cough, no hemoptysis,  No asthma or wheezing  Musculoskeletal:  [ ]  Arthritis, [ ]  Low back pain,  [ ]  Joint pain  Hematologic:No history of hypercoagulable state.  No history of easy bleeding.  No history of anemia  Gastrointestinal: No hematochezia or melena,  No gastroesophageal reflux, no trouble swallowing  Urinary: [ x] chronic Kidney disease, [ ]  on HD - [ ]  MWF or [x ] TTHS, [ ]  Burning with urination, [ ]  Frequent urination, [ ]  Difficulty urinating;   Skin: No rashes  Psychological: No history of anxiety,  No history of depression   Physical Examination  Vitals:   08/13/16 1300  BP: 135/78  Pulse: 81  Resp: 14  Temp: 97.6 F (36.4 C)  SpO2: 100%  Weight: 113 lb (51.3 kg)  Height: 5\' 5"  (1.651 m)    Body mass index is 18.8 kg/m.  General:  Alert and oriented, no acute distress HEENT: Normal Neck: No bruit or JVD Pulmonary: Clear to auscultation bilaterally Cardiac: Regular Rate and Rhythm without murmur Gastrointestinal: Soft, non-tender, non-distended, no mass, no scars Skin: No rash, left UE well healed incisions.   Extremity Pulses:  2+ radial, brachial pulses bilaterally, doppler biphasic radial, ulnar and palmer arch  Musculoskeletal: No  deformity or edema, she has pain with movement in the thumb, pointer and middle digits on the left hand.    Neurologic: Upper and lower extremity  Grossly motor 5/5 and symmetric  DATA:  Review of vein mapping  Right cephalic and basilic are < 2 mm in size.  Arterial duplex to r/o steal shows  ABI 1.09 left UE   ASSESSMENT:  Poorly functioning left upper arm AV graft ESRD Pain left hand progressively getting  worse since revision of the venous graft anastomosis. Reported clotting on HD in the venous side of the graft.    PLAN: Her studies today show good arterial blood flow to the left hand.  I can palpate a radial pulse and doppler ulnar and palmer signals.  I do not think she has steal syndrome.  It may be possible that the median nerve was irritated during the revision surgery at the brachial plexus?  Dr. Nicole Cella clinical note states she will need new access if this one fails.  We will plan right UE av graft placement.   If we can we will continue to use the left graft until the right is ready.  At that time we can ligate the left fistula and see if this helps her left hand pain.    The patient had to leave prior to scheduling surgery.  She was called and she states she will not be able to proceed with surgery Monday and she will call us when she wants to have it scheduled.     Theda Sers, Keshonda Monsour MAUREEN PA-C [facility]   Discussed with Dr. Trula Slade in clinic today

## 2016-08-14 DIAGNOSIS — R739 Hyperglycemia, unspecified: Secondary | ICD-10-CM | POA: Diagnosis not present

## 2016-08-14 DIAGNOSIS — N2581 Secondary hyperparathyroidism of renal origin: Secondary | ICD-10-CM | POA: Diagnosis not present

## 2016-08-14 DIAGNOSIS — D631 Anemia in chronic kidney disease: Secondary | ICD-10-CM | POA: Diagnosis not present

## 2016-08-14 DIAGNOSIS — N186 End stage renal disease: Secondary | ICD-10-CM | POA: Diagnosis not present

## 2016-08-14 DIAGNOSIS — E1029 Type 1 diabetes mellitus with other diabetic kidney complication: Secondary | ICD-10-CM | POA: Diagnosis not present

## 2016-08-14 DIAGNOSIS — D509 Iron deficiency anemia, unspecified: Secondary | ICD-10-CM | POA: Diagnosis not present

## 2016-08-15 ENCOUNTER — Ambulatory Visit (HOSPITAL_COMMUNITY): Admit: 2016-08-15 | Payer: Medicare Other | Admitting: Vascular Surgery

## 2016-08-15 ENCOUNTER — Telehealth: Payer: Self-pay

## 2016-08-15 ENCOUNTER — Encounter (HOSPITAL_COMMUNITY): Payer: Self-pay

## 2016-08-15 SURGERY — A/V SHUNTOGRAM
Anesthesia: LOCAL | Laterality: Left

## 2016-08-15 NOTE — Telephone Encounter (Signed)
Done

## 2016-08-15 NOTE — Telephone Encounter (Signed)
Phone call from pt. With request to schedule f/u appt. With Dr. Scot Dock.  Stated she still has a painful pulsation in the left thumb, index, and middle fingers.  Reported this has been going on since surgery.  Reported that once the pain medication wears off, the pain starts up.   Saw the PA on 1/24, and was advised to get vein mapping and prepare for a new access.  The pt. stated that she was advised by the Nephrologist, that with one episode of clotting, he didn't recommend starting over with new access.  Requested an appt. to see Dr. Scot Dock.  Appt. given for 8:30 AM 08/20/16; agreed with plan.

## 2016-08-16 DIAGNOSIS — D631 Anemia in chronic kidney disease: Secondary | ICD-10-CM | POA: Diagnosis not present

## 2016-08-16 DIAGNOSIS — N186 End stage renal disease: Secondary | ICD-10-CM | POA: Diagnosis not present

## 2016-08-16 DIAGNOSIS — N2581 Secondary hyperparathyroidism of renal origin: Secondary | ICD-10-CM | POA: Diagnosis not present

## 2016-08-16 DIAGNOSIS — D509 Iron deficiency anemia, unspecified: Secondary | ICD-10-CM | POA: Diagnosis not present

## 2016-08-16 DIAGNOSIS — E1029 Type 1 diabetes mellitus with other diabetic kidney complication: Secondary | ICD-10-CM | POA: Diagnosis not present

## 2016-08-16 DIAGNOSIS — R739 Hyperglycemia, unspecified: Secondary | ICD-10-CM | POA: Diagnosis not present

## 2016-08-18 ENCOUNTER — Encounter: Payer: Self-pay | Admitting: Vascular Surgery

## 2016-08-18 ENCOUNTER — Ambulatory Visit: Admit: 2016-08-18 | Payer: Medicare Other | Admitting: Vascular Surgery

## 2016-08-18 SURGERY — INSERTION OF ARTERIOVENOUS (AV) GORE-TEX GRAFT ARM
Anesthesia: Choice | Laterality: Right

## 2016-08-19 DIAGNOSIS — D631 Anemia in chronic kidney disease: Secondary | ICD-10-CM | POA: Diagnosis not present

## 2016-08-19 DIAGNOSIS — D509 Iron deficiency anemia, unspecified: Secondary | ICD-10-CM | POA: Diagnosis not present

## 2016-08-19 DIAGNOSIS — E1029 Type 1 diabetes mellitus with other diabetic kidney complication: Secondary | ICD-10-CM | POA: Diagnosis not present

## 2016-08-19 DIAGNOSIS — R739 Hyperglycemia, unspecified: Secondary | ICD-10-CM | POA: Diagnosis not present

## 2016-08-19 DIAGNOSIS — N186 End stage renal disease: Secondary | ICD-10-CM | POA: Diagnosis not present

## 2016-08-19 DIAGNOSIS — N2581 Secondary hyperparathyroidism of renal origin: Secondary | ICD-10-CM | POA: Diagnosis not present

## 2016-08-20 ENCOUNTER — Ambulatory Visit (INDEPENDENT_AMBULATORY_CARE_PROVIDER_SITE_OTHER): Payer: Medicare Other | Admitting: Vascular Surgery

## 2016-08-20 ENCOUNTER — Encounter: Payer: Self-pay | Admitting: Vascular Surgery

## 2016-08-20 VITALS — BP 147/83 | HR 82 | Temp 98.2°F | Resp 18 | Ht 65.0 in | Wt 113.5 lb

## 2016-08-20 DIAGNOSIS — Z992 Dependence on renal dialysis: Secondary | ICD-10-CM | POA: Diagnosis not present

## 2016-08-20 DIAGNOSIS — N186 End stage renal disease: Secondary | ICD-10-CM | POA: Diagnosis not present

## 2016-08-20 DIAGNOSIS — Z48812 Encounter for surgical aftercare following surgery on the circulatory system: Secondary | ICD-10-CM

## 2016-08-20 DIAGNOSIS — E1029 Type 1 diabetes mellitus with other diabetic kidney complication: Secondary | ICD-10-CM | POA: Diagnosis not present

## 2016-08-20 NOTE — Progress Notes (Signed)
Patient name: [name] MRN: [id number] DOB: July 22, 1975 Sex: female  REASON FOR VISIT: Painful left thumb index and middle finger.  HPI: [name] is a 41 y.o. female who had a poorly functioning left upper arm graft. She had a fistulogram performed by [facility] which showed occlusion of the outflow vein but the graft was patent because of collaterals. There appeared to be an adjacent axillary vein and I was asked to revise the graft into this vein. At the time of surgery the vein was found to be quite small but the thrill did improve after revision. This was performed on 08/01/2016.  She did have a steal study that was done on 08/13/2016 which did not show any evidence of significant steal.  She complains of some pain in her left thumb index and middle finger and some paresthesias. Her graft has been working well.  Current Outpatient Prescriptions  Medication Sig Dispense Refill  . acetaminophen (TYLENOL) 500 MG tablet Take 500 mg by mouth every 6 (six) hours as needed for moderate pain or headache.    Marland Kitchen amLODipine (NORVASC) 10 MG tablet Take 10 mg by mouth daily.     Marland Kitchen aspirin 81 MG EC tablet Take 81 mg by mouth daily. Swallow whole.    . calcium acetate (PHOSLO) 667 MG capsule Take 667 mg by mouth 3 (three) times daily with meals.      . cinacalcet (SENSIPAR) 30 MG tablet Take 30 mg by mouth daily.    . famotidine (PEPCID AC) 10 MG chewable tablet Chew 10 mg by mouth daily as needed for heartburn.    Marland Kitchen GLUCAGON EMERGENCY 1 MG injection Inject 1 mg into the muscle once as needed (for severe low blood sugar).     . insulin aspart (NOVOLOG) 100 UNIT/ML FlexPen Inject 3-10 Units into the skin See admin instructions. Based on sliding scale 1 unit per 20g of carbs    . insulin glargine (LANTUS) 100 UNIT/ML injection Inject 10 Units into the skin at bedtime.     Marland Kitchen labetalol (NORMODYNE) 300 MG tablet Take 300 mg by mouth 3 (three) times daily.    Marland Kitchen oxyCODONE-acetaminophen  (PERCOCET/ROXICET) 5-325 MG tablet Take 1 tablet by mouth every 6 (six) hours as needed. 6 tablet 0  . Pediatric Multiple Vit-C-FA (FLINSTONES GUMMIES OMEGA-3 DHA) CHEW Chew 1 tablet by mouth daily.     . rosuvastatin (CRESTOR) 10 MG tablet Take 10 mg by mouth every evening.     . valsartan (DIOVAN) 160 MG tablet Take 160 mg by mouth daily.  6   No current facility-administered medications for this visit.     REVIEW OF SYSTEMS:  [X]  denotes positive finding, [ ]  denotes negative finding Cardiac  Comments:  Chest pain or chest pressure:    Shortness of breath upon exertion:    Short of breath when lying flat:    Irregular heart rhythm:    Constitutional    Fever or chills:      PHYSICAL EXAM: Vitals:   08/20/16 0847 08/20/16 0848  BP: (!) 151/84 (!) 147/83  Pulse: 82   Resp: 18   Temp: 98.2 F (36.8 C)   TempSrc: Oral   SpO2: 100%   Weight: 113 lb 8.6 oz (51.5 kg)   Height: 5\' 5"  (1.651 m)     GENERAL: The patient is a well-nourished female, in no acute distress. The vital signs are documented above. CARDIOVASCULAR: There is a regular rate and rhythm. PULMONARY: There is  good air exchange bilaterally without wheezing or rales. She has a palpable left radial pulse. The left hand is warm and well-perfused. Her graft is patent and slightly pulsatile.  MEDICAL ISSUES:  PAIN LEFT HAND STATUS POST REVISION OF LEFT AV GRAFT: Her steal study was normal and she has a palpable radial pulse. Thus I do not think her symptoms can be attributed to steal. The dissection was very deep to access the adjacent vein and she could potentially have had some compression on adjacent nerves from retractors. I think this will gradually improve with time. If her symptoms progress she knows to call back.  [name] [facility] [phone]

## 2016-08-21 DIAGNOSIS — Z992 Dependence on renal dialysis: Secondary | ICD-10-CM | POA: Diagnosis not present

## 2016-08-21 DIAGNOSIS — D509 Iron deficiency anemia, unspecified: Secondary | ICD-10-CM | POA: Diagnosis not present

## 2016-08-21 DIAGNOSIS — G629 Polyneuropathy, unspecified: Secondary | ICD-10-CM | POA: Diagnosis not present

## 2016-08-21 DIAGNOSIS — I119 Hypertensive heart disease without heart failure: Secondary | ICD-10-CM | POA: Diagnosis not present

## 2016-08-21 DIAGNOSIS — Z794 Long term (current) use of insulin: Secondary | ICD-10-CM | POA: Diagnosis not present

## 2016-08-21 DIAGNOSIS — N186 End stage renal disease: Secondary | ICD-10-CM | POA: Diagnosis not present

## 2016-08-21 DIAGNOSIS — D638 Anemia in other chronic diseases classified elsewhere: Secondary | ICD-10-CM | POA: Diagnosis not present

## 2016-08-21 DIAGNOSIS — D631 Anemia in chronic kidney disease: Secondary | ICD-10-CM | POA: Diagnosis not present

## 2016-08-21 DIAGNOSIS — E119 Type 2 diabetes mellitus without complications: Secondary | ICD-10-CM | POA: Diagnosis not present

## 2016-08-21 DIAGNOSIS — I10 Essential (primary) hypertension: Secondary | ICD-10-CM | POA: Diagnosis not present

## 2016-08-21 DIAGNOSIS — E785 Hyperlipidemia, unspecified: Secondary | ICD-10-CM | POA: Diagnosis not present

## 2016-08-21 DIAGNOSIS — E538 Deficiency of other specified B group vitamins: Secondary | ICD-10-CM | POA: Diagnosis not present

## 2016-08-21 DIAGNOSIS — E1029 Type 1 diabetes mellitus with other diabetic kidney complication: Secondary | ICD-10-CM | POA: Diagnosis not present

## 2016-08-21 DIAGNOSIS — N2581 Secondary hyperparathyroidism of renal origin: Secondary | ICD-10-CM | POA: Diagnosis not present

## 2016-08-21 DIAGNOSIS — R739 Hyperglycemia, unspecified: Secondary | ICD-10-CM | POA: Diagnosis not present

## 2016-08-21 DIAGNOSIS — M79602 Pain in left arm: Secondary | ICD-10-CM | POA: Diagnosis not present

## 2016-08-23 DIAGNOSIS — N2581 Secondary hyperparathyroidism of renal origin: Secondary | ICD-10-CM | POA: Diagnosis not present

## 2016-08-23 DIAGNOSIS — D509 Iron deficiency anemia, unspecified: Secondary | ICD-10-CM | POA: Diagnosis not present

## 2016-08-23 DIAGNOSIS — R739 Hyperglycemia, unspecified: Secondary | ICD-10-CM | POA: Diagnosis not present

## 2016-08-23 DIAGNOSIS — E1029 Type 1 diabetes mellitus with other diabetic kidney complication: Secondary | ICD-10-CM | POA: Diagnosis not present

## 2016-08-23 DIAGNOSIS — D631 Anemia in chronic kidney disease: Secondary | ICD-10-CM | POA: Diagnosis not present

## 2016-08-23 DIAGNOSIS — N186 End stage renal disease: Secondary | ICD-10-CM | POA: Diagnosis not present

## 2016-08-25 ENCOUNTER — Encounter: Payer: Self-pay | Admitting: Neurology

## 2016-08-25 ENCOUNTER — Ambulatory Visit (INDEPENDENT_AMBULATORY_CARE_PROVIDER_SITE_OTHER): Payer: Medicare Other | Admitting: Neurology

## 2016-08-25 VITALS — BP 145/73 | HR 81 | Ht 65.0 in | Wt 120.0 lb

## 2016-08-25 DIAGNOSIS — N186 End stage renal disease: Secondary | ICD-10-CM

## 2016-08-25 DIAGNOSIS — I6302 Cerebral infarction due to thrombosis of basilar artery: Secondary | ICD-10-CM | POA: Diagnosis not present

## 2016-08-25 DIAGNOSIS — Z992 Dependence on renal dialysis: Secondary | ICD-10-CM | POA: Diagnosis not present

## 2016-08-25 DIAGNOSIS — E785 Hyperlipidemia, unspecified: Secondary | ICD-10-CM | POA: Diagnosis not present

## 2016-08-25 DIAGNOSIS — M79642 Pain in left hand: Secondary | ICD-10-CM | POA: Diagnosis not present

## 2016-08-25 DIAGNOSIS — E1059 Type 1 diabetes mellitus with other circulatory complications: Secondary | ICD-10-CM

## 2016-08-25 MED ORDER — LIDOCAINE 4 % EX GEL: CUTANEOUS | 3 refills | Status: DC

## 2016-08-25 NOTE — Patient Instructions (Addendum)
-   continue ASA and crestor for stroke prevention - follow up with endocrinologist for better DM control - Follow up with your primary care physician for stroke risk factor modification. Recommend maintain blood pressure goal <130/80, diabetes with hemoglobin A1c goal below 6.5% and lipids with LDL cholesterol goal below 70 mg/dL.  - check BP and glucose at home and record and bring over to PCP - will do nerve conduction study for left hand pain - you seems not tolerating gabapentin, please stop taking - ordered lidocaine gel for pain relief - recommend left wrist brace (normally used for carpal tunnel syndrome) at night to help pain relief. Please buy it from drug store or medical supply store.  - continue other medications  - follow up in 4 months with me.

## 2016-08-25 NOTE — Progress Notes (Signed)
STROKE NEUROLOGY FOLLOW UP NOTE  NAME: BRYNLEI KLAUSNER DOB: [birth year]  REASON FOR VISIT: stroke follow up HISTORY FROM: chart and pt  Today we had the pleasure of seeing [name] in follow-up at our Neurology Clinic. Pt was accompanied by no one.   History Summary 41 year old AAF with multiple medical problems including uncontrolled HTN, DM, HLD and ESRD on HD was admitted on 03/29/14 for acute onset dizziness, vertigo, and right facial droop with drooling. She was found to have high BP in ER around 200s/100s. Her vertigo lasted about 10-56min but right facial droop lasted about 4-5 days. She declined PRISMS trial in ER. MRI did not show acute stroke. Her A1C was 10.3 and LDL 100. She was discharged with ASA 325mg  and crestor.   05/22/14 follow up - the patient has been doing well. No recurrent stroke symptoms. Her right facial droop resolved and denies any weakness, nubmness, HA or dizziness. Her sugar level is not in good control still, low as 49 today at home and high as 200s. She is on lantus and novolog at home. She usually not eating breakfast but eat more in lunch and dinner. Her BP was 132/75 today in clinic. She is still using Depo-provera for contraception and about to get another injection in the middle of this month.  Interval History During the interval time, pt has lost follow up. Today pt came in for new symptoms of left finger pain. She continued to have dialysis since last visit, and had followed with [facility] for vascular access. She had right arm graft revision on 08/01/16. After that procedure, she started to have left first 3 digit numbness and pain. The thenar area also hurting. No problem with last 2 digits. She stated that the numbness and pain getting worse over time, especially at night. Extending fingers seems to help but during sleep she can not keep the finger extended. The middle finger numbness and pain now resolved but the first 2 fingers still numb and hurt.  She went back to see Dr. Scot Dock and had steal study which was normal and she had palpable radial pulse. Dr. Scot Dock thinks it could potentially be some compression on adjacent nerves from retractors which may gradually improve with time. Her PCP referred her back to neurology.   During the encounter, pt has frequent dry heaves, vomiting, presumed to be DM related gastroparesis. She is on home reglan and pepcid. She follows with endocrinologist and plan for insulin pump. However, she stated that her dry heaves and vomiting started after she took gabapentin recently prescribed to her. She is on gabapentin 300mg  Qhs, and she would like to stop taking it.   REVIEW OF SYSTEMS: Full 14 system review of systems performed and notable only for those listed below and in HPI above, all others are negative:  Constitutional:  Cardiovascular: N/A  Ear/Nose/Throat: N/A  Skin: N/A  Eyes: N/A  Respiratory:   Gastroitestinal: N/A  Genitourinary: N/A Hematology/Lymphatic:   Endocrine:   Musculoskeletal: N/A  Allergy/Immunology:   Neurological: HA  Psychiatric:   The following represents the patient's updated allergies and side effects list: Allergies  Allergen Reactions  . Pollen Extract Other (See Comments)    Watery eyes    Labs since last visit of relevance include the following: Results for orders placed or performed during the hospital encounter of 08/01/16  hCG, serum, qualitative  Result Value Ref Range   Preg, Serum NEGATIVE NEGATIVE  Glucose, capillary  Result Value  Ref Range   Glucose-Capillary 231 (H) 65 - 99 mg/dL  Glucose, capillary  Result Value Ref Range   Glucose-Capillary 233 (H) 65 - 99 mg/dL   Comment 1 Notify RN   I-STAT 4, (NA,K, GLUC, HGB,HCT)  Result Value Ref Range   Sodium 137 135 - 145 mmol/L   Potassium 4.1 3.5 - 5.1 mmol/L   Glucose, Bld 212 (H) 65 - 99 mg/dL   HCT 42.0 36.0 - 46.0 %   Hemoglobin 14.3 12.0 - 15.0 g/dL    The neurologically relevant items on  the patient's problem list were reviewed on today's visit.  Neurologic Examination  A problem focused neurological exam (12 or more points of the single system neurologic examination, vital signs counts as 1 point, cranial nerves count for 8 points) was performed.  Blood pressure (!) 145/73, pulse 81, height 5\' 5"  (1.651 m), weight 120 lb (54.4 kg).  General - thin and fragile, well developed, acute distress due to dry heaves and vomiting.  Ophthalmologic - Sharp disc margins OU.  Cardiovascular - Regular rate and rhythm with no murmur.  Mental Status -  Level of arousal and orientation to time, place, and person were intact. Language including expression, naming, repetition, comprehension, reading, and writing was assessed and found intact.  Cranial Nerves II - XII - II - Visual field intact OU. III, IV, VI - Extraocular movements intact. V - Facial sensation intact bilaterally. VII - Facial movement intact bilaterally. VIII - Hearing & vestibular intact bilaterally. X - Palate elevates symmetrically. XI - Chin turning & shoulder shrug intact bilaterally. XII - Tongue protrusion intact.  Motor Strength - The patient's strength was normal in all extremities and pronator drift was absent.  Bulk was normal and fasciculations were absent.   Motor Tone - Muscle tone was assessed at the neck and appendages and was normal.  Reflexes - The patient's reflexes were normal in all extremities and she had no pathological reflexes.  Sensory - Light touch, temperature/pinprick were assessed and were decreased at left thumb and index finger, both anterior and posterior. Thenar eminence tenderness on palpation.     Coordination - The patient had normal movements in the hands and feet with no ataxia or dysmetria.  Tremor was absent.  Gait and Station - The patient's transfers, posture, gait, station, and turns were observed as normal.  Data reviewed: I personally reviewed the images and agree  with the radiology interpretations.  MRI and MRA of the brain  1. No acute intracranial abnormality. 2. Moderate age advanced atrophy. 3. Normal variant MRA circle of Willis without evidence for significant proximal stenosis, aneurysm, or branch vessel occlusion.  Carotid Doppler Bilateral: 1-39% ICA stenosis. Vertebral artery flow is antegrade.  2D Echocardiogram  - Left ventricle: E/e&'>14.4 suggestive of elevated LV filling pressures. The cavity size was normal. Systolic function was normal. The estimated ejection fraction was in the range of 55% to 60%. Wall motion was normal; there were no regional wall motion abnormalities. - Aortic valve: Moderate thickening and calcification, consistent with sclerosis. - Mitral valve: There was trivial regurgitation. - Tricuspid valve: There was trivial regurgitation. - Pulmonic valve: There was trivial regurgitation. - Pericardium, extracardiac: A trivial pericardial effusion was identified posterior to the heart.  [place] steal exam - no significant increase of pressure with compression of fistula.   A1C 10.3 and LDL 100  Component     Latest Ref Rng & Units 09/12/2014  Hemoglobin A1C     4.8 -  5.6 % 10.5 (H)  Mean Plasma Glucose     mg/dL 255  TSH     0.350 - 4.500 uIU/mL 0.936    Assessment: As you may recall, she is a 41 y.o. African American female with PMH of uncontrolled HTN and DM type I, HLD, ESRD on HD was admitted in 03/2014 for acute onset vertigo and right facial droop and drooling. MRI negative for stroke, MRA unremarkable. CUS and echo were WNL. However, she was found to have A1C 10.3 and LDL 100 as well as high BP. Her symptoms were considered as small vessel event not visualized in MRI due to multiple stroke risk factors. Her BP was good in clinic but her sugar still fluctuate and not in good control, following with endocrinology and plan for insulin pump. She is having gastroparesis with dry heaves and vomiting  during the visit. She is on ASA 325 and crestor. She has been using depo-provera for contraception.  She complain of left thumb and index finger pain numbness after left arm fistular revision on 08/01/16. [place] steal study was normal and she was put on gabapentin. However, she can not tolerate gabapentin. Will discontinue. Will put on lidocaine topical cream for symptom relief. Will do EMG/NCS. Recommend left wrist brace to help at night.   Plan:  - continue ASA and crestor for stroke prevention - follow up with endocrinologist for better DM control - Follow up with your primary care physician for stroke risk factor modification. Recommend maintain blood pressure goal <130/80, diabetes with hemoglobin A1c goal below 6.5% and lipids with LDL cholesterol goal below 70 mg/dL.  - check BP and glucose at home and record and bring over to PCP - will do EMG/NCS for left hand pain - discontinue gabapentin - ordered lidocaine gel for pain relief - recommend left wrist brace at night to help pain relief.  - continue other medications  - follow up in 4 months with me.  I spent more than 25 minutes of face to face time with the patient. Greater than 50% of time was spent in counseling and coordination of care. We discussed further work up at left hand pain numbness, lidocaine cream and left wrist brace at night.    Orders Placed This Encounter  Procedures  . NCV with EMG(electromyography)    Left first 3 digits pain and numbness, middle finger now normal, but first 2 fingers still numb and painful.    Standing Status:   Future    Standing Expiration Date:   08/25/2017    Scheduling Instructions:     Please schedule in about 2 weeks. Thanks    Order Specific Question:   Where should this test be performed?    Answer:   [facility]    Meds ordered this encounter  Medications  . DISCONTD: gabapentin (NEURONTIN) 300 MG capsule    Sig: at bedtime.  . Lidocaine 4 % GEL    Sig: Use at left hand for pain relief      Dispense:  1 Tube    Refill:  3    Patient Instructions  - continue ASA and crestor for stroke prevention - follow up with endocrinologist for better DM control - Follow up with your primary care physician for stroke risk factor modification. Recommend maintain blood pressure goal <130/80, diabetes with hemoglobin A1c goal below 6.5% and lipids with LDL cholesterol goal below 70 mg/dL.  - check BP and glucose at home and record and bring over to PCP -  will do nerve conduction study for left hand pain - you seems not tolerating gabapentin, please stop taking - ordered lidocaine gel for pain relief - recommend left wrist brace (normally used for carpal tunnel syndrome) at night to help pain relief. Please buy it from drug store or medical supply store.  - continue other medications  - follow up in 4 months with me.   Rosalin Hawking, MD PhD [facility] [address] [place], [place] [address] [phone]

## 2016-08-26 DIAGNOSIS — N186 End stage renal disease: Secondary | ICD-10-CM | POA: Diagnosis not present

## 2016-08-26 DIAGNOSIS — E1029 Type 1 diabetes mellitus with other diabetic kidney complication: Secondary | ICD-10-CM | POA: Diagnosis not present

## 2016-08-26 DIAGNOSIS — N2581 Secondary hyperparathyroidism of renal origin: Secondary | ICD-10-CM | POA: Diagnosis not present

## 2016-08-26 DIAGNOSIS — D631 Anemia in chronic kidney disease: Secondary | ICD-10-CM | POA: Diagnosis not present

## 2016-08-26 DIAGNOSIS — D509 Iron deficiency anemia, unspecified: Secondary | ICD-10-CM | POA: Diagnosis not present

## 2016-08-26 DIAGNOSIS — R739 Hyperglycemia, unspecified: Secondary | ICD-10-CM | POA: Diagnosis not present

## 2016-08-28 DIAGNOSIS — N186 End stage renal disease: Secondary | ICD-10-CM | POA: Diagnosis not present

## 2016-08-28 DIAGNOSIS — R739 Hyperglycemia, unspecified: Secondary | ICD-10-CM | POA: Diagnosis not present

## 2016-08-28 DIAGNOSIS — D631 Anemia in chronic kidney disease: Secondary | ICD-10-CM | POA: Diagnosis not present

## 2016-08-28 DIAGNOSIS — D509 Iron deficiency anemia, unspecified: Secondary | ICD-10-CM | POA: Diagnosis not present

## 2016-08-28 DIAGNOSIS — N2581 Secondary hyperparathyroidism of renal origin: Secondary | ICD-10-CM | POA: Diagnosis not present

## 2016-08-28 DIAGNOSIS — E1029 Type 1 diabetes mellitus with other diabetic kidney complication: Secondary | ICD-10-CM | POA: Diagnosis not present

## 2016-08-30 DIAGNOSIS — E1029 Type 1 diabetes mellitus with other diabetic kidney complication: Secondary | ICD-10-CM | POA: Diagnosis not present

## 2016-08-30 DIAGNOSIS — N2581 Secondary hyperparathyroidism of renal origin: Secondary | ICD-10-CM | POA: Diagnosis not present

## 2016-08-30 DIAGNOSIS — R739 Hyperglycemia, unspecified: Secondary | ICD-10-CM | POA: Diagnosis not present

## 2016-08-30 DIAGNOSIS — N186 End stage renal disease: Secondary | ICD-10-CM | POA: Diagnosis not present

## 2016-08-30 DIAGNOSIS — D631 Anemia in chronic kidney disease: Secondary | ICD-10-CM | POA: Diagnosis not present

## 2016-08-30 DIAGNOSIS — D509 Iron deficiency anemia, unspecified: Secondary | ICD-10-CM | POA: Diagnosis not present

## 2016-09-02 DIAGNOSIS — D509 Iron deficiency anemia, unspecified: Secondary | ICD-10-CM | POA: Diagnosis not present

## 2016-09-02 DIAGNOSIS — D631 Anemia in chronic kidney disease: Secondary | ICD-10-CM | POA: Diagnosis not present

## 2016-09-02 DIAGNOSIS — N186 End stage renal disease: Secondary | ICD-10-CM | POA: Diagnosis not present

## 2016-09-02 DIAGNOSIS — R739 Hyperglycemia, unspecified: Secondary | ICD-10-CM | POA: Diagnosis not present

## 2016-09-02 DIAGNOSIS — N2581 Secondary hyperparathyroidism of renal origin: Secondary | ICD-10-CM | POA: Diagnosis not present

## 2016-09-02 DIAGNOSIS — E1029 Type 1 diabetes mellitus with other diabetic kidney complication: Secondary | ICD-10-CM | POA: Diagnosis not present

## 2016-09-03 ENCOUNTER — Ambulatory Visit (INDEPENDENT_AMBULATORY_CARE_PROVIDER_SITE_OTHER): Payer: Self-pay | Admitting: Neurology

## 2016-09-03 ENCOUNTER — Telehealth: Payer: Self-pay | Admitting: Neurology

## 2016-09-03 ENCOUNTER — Ambulatory Visit (INDEPENDENT_AMBULATORY_CARE_PROVIDER_SITE_OTHER): Payer: Medicare Other | Admitting: Neurology

## 2016-09-03 ENCOUNTER — Encounter: Payer: Self-pay | Admitting: Neurology

## 2016-09-03 DIAGNOSIS — M79642 Pain in left hand: Secondary | ICD-10-CM

## 2016-09-03 NOTE — Procedures (Signed)
     HISTORY:  [name] is a 41 year old patient with a history of end-stage renal disease secondary to diabetes who underwent an AV graft placement on the left arm on August 01 2016. Following this procedure, the patient has had some paresthesias and burning sensations in the thumb and second and third fingers of the left hand. The patient denies any neck pain or pain down the left arm. The patient is being evaluated for the above symptoms. The symptoms have improved but not resolved since onset.  NERVE CONDUCTION STUDIES:  Nerve conduction studies were performed on the left upper extremity. The distal motor latencies and motor amplitudes for the median and ulnar nerves were within normal limits. The F wave latencies and nerve conduction velocities for these nerves were also normal. The sensory latencies for the median and ulnar nerves were normal.   EMG STUDIES:  EMG study was performed on the left upper extremity:  The first dorsal interosseous muscle reveals 2 to 4 K units with full recruitment. No fibrillations or positive waves were noted. The abductor pollicis brevis muscle reveals 2 to 4 K units with full recruitment. No fibrillations or positive waves were noted. The extensor indicis proprius muscle reveals 1 to 3 K units with full recruitment. No fibrillations or positive waves were noted. The pronator teres muscle reveals 2 to 3 K units with full recruitment. No fibrillations or positive waves were noted. The biceps muscle reveals 1 to 2 K units with full recruitment. No fibrillations or positive waves were noted. The triceps muscle reveals 2 to 4 K units with full recruitment. No fibrillations or positive waves were noted. The anterior deltoid muscle reveals 2 to 3 K units with full recruitment. No fibrillations or positive waves were noted. The cervical paraspinal muscles were tested at 2 levels. No abnormalities of insertional activity were seen at either level tested,  with exception of a brief run of complex repetitive discharges in the lower level. There was good relaxation.   IMPRESSION:  Nerve conduction studies of the left upper extremity were unremarkable, without evidence of a neuropathy seen. EMG evaluation of the left arm is unremarkable without evidence of an overlying cervical radiculopathy.  Jill Alexanders MD 09/03/2016 1:51 PM  [facility] [address] [place], [place] [address]  Phone [phone] Fax [phone]

## 2016-09-03 NOTE — Telephone Encounter (Signed)
Called patient and let her know about EMG/NCS result, which showed no neuropathy or radiculopathy at left upper extremity. In addition, she stated that if her hand is cold, she will feel pain on the fingers. If she lift her left arm up, she will also feel pain on the left hand, which can be relieved after she put her arm down.   All of above indicate that the left finger pain most likely due to vascular origin instead of neuropathy. Recommend close follow-up with vascular surgery. Not sure if Neurontin would help in this situation, however she is on low dose Neurontin ordered by her nephrologist. Will pass along the message to her PCP also.  EMG/NCS report 09/03/16: Nerve conduction studies of the left upper extremity were unremarkable, without evidence of a neuropathy seen. EMG evaluation of the left arm is unremarkable without evidence of an overlying cervical radiculopathy.  Rosalin Hawking, MD PhD Stroke Neurology 09/03/2016 6:00 PM

## 2016-09-03 NOTE — Progress Notes (Signed)
Please refer to EMG and nerve conduction study procedure note. 

## 2016-09-04 DIAGNOSIS — E1029 Type 1 diabetes mellitus with other diabetic kidney complication: Secondary | ICD-10-CM | POA: Diagnosis not present

## 2016-09-04 DIAGNOSIS — R739 Hyperglycemia, unspecified: Secondary | ICD-10-CM | POA: Diagnosis not present

## 2016-09-04 DIAGNOSIS — D631 Anemia in chronic kidney disease: Secondary | ICD-10-CM | POA: Diagnosis not present

## 2016-09-04 DIAGNOSIS — N2581 Secondary hyperparathyroidism of renal origin: Secondary | ICD-10-CM | POA: Diagnosis not present

## 2016-09-04 DIAGNOSIS — N186 End stage renal disease: Secondary | ICD-10-CM | POA: Diagnosis not present

## 2016-09-04 DIAGNOSIS — D509 Iron deficiency anemia, unspecified: Secondary | ICD-10-CM | POA: Diagnosis not present

## 2016-09-05 DIAGNOSIS — I119 Hypertensive heart disease without heart failure: Secondary | ICD-10-CM | POA: Diagnosis not present

## 2016-09-05 DIAGNOSIS — D638 Anemia in other chronic diseases classified elsewhere: Secondary | ICD-10-CM | POA: Diagnosis not present

## 2016-09-05 DIAGNOSIS — E538 Deficiency of other specified B group vitamins: Secondary | ICD-10-CM | POA: Diagnosis not present

## 2016-09-05 DIAGNOSIS — Z992 Dependence on renal dialysis: Secondary | ICD-10-CM | POA: Diagnosis not present

## 2016-09-05 DIAGNOSIS — G629 Polyneuropathy, unspecified: Secondary | ICD-10-CM | POA: Diagnosis not present

## 2016-09-05 DIAGNOSIS — E119 Type 2 diabetes mellitus without complications: Secondary | ICD-10-CM | POA: Diagnosis not present

## 2016-09-05 DIAGNOSIS — I10 Essential (primary) hypertension: Secondary | ICD-10-CM | POA: Diagnosis not present

## 2016-09-05 DIAGNOSIS — M79602 Pain in left arm: Secondary | ICD-10-CM | POA: Diagnosis not present

## 2016-09-05 DIAGNOSIS — Z794 Long term (current) use of insulin: Secondary | ICD-10-CM | POA: Diagnosis not present

## 2016-09-05 DIAGNOSIS — N186 End stage renal disease: Secondary | ICD-10-CM | POA: Diagnosis not present

## 2016-09-05 DIAGNOSIS — E785 Hyperlipidemia, unspecified: Secondary | ICD-10-CM | POA: Diagnosis not present

## 2016-09-06 DIAGNOSIS — R739 Hyperglycemia, unspecified: Secondary | ICD-10-CM | POA: Diagnosis not present

## 2016-09-06 DIAGNOSIS — N2581 Secondary hyperparathyroidism of renal origin: Secondary | ICD-10-CM | POA: Diagnosis not present

## 2016-09-06 DIAGNOSIS — D509 Iron deficiency anemia, unspecified: Secondary | ICD-10-CM | POA: Diagnosis not present

## 2016-09-06 DIAGNOSIS — N186 End stage renal disease: Secondary | ICD-10-CM | POA: Diagnosis not present

## 2016-09-06 DIAGNOSIS — D631 Anemia in chronic kidney disease: Secondary | ICD-10-CM | POA: Diagnosis not present

## 2016-09-06 DIAGNOSIS — E1029 Type 1 diabetes mellitus with other diabetic kidney complication: Secondary | ICD-10-CM | POA: Diagnosis not present

## 2016-09-09 DIAGNOSIS — D631 Anemia in chronic kidney disease: Secondary | ICD-10-CM | POA: Diagnosis not present

## 2016-09-09 DIAGNOSIS — R739 Hyperglycemia, unspecified: Secondary | ICD-10-CM | POA: Diagnosis not present

## 2016-09-09 DIAGNOSIS — D509 Iron deficiency anemia, unspecified: Secondary | ICD-10-CM | POA: Diagnosis not present

## 2016-09-09 DIAGNOSIS — N2581 Secondary hyperparathyroidism of renal origin: Secondary | ICD-10-CM | POA: Diagnosis not present

## 2016-09-09 DIAGNOSIS — E1029 Type 1 diabetes mellitus with other diabetic kidney complication: Secondary | ICD-10-CM | POA: Diagnosis not present

## 2016-09-09 DIAGNOSIS — N186 End stage renal disease: Secondary | ICD-10-CM | POA: Diagnosis not present

## 2016-09-11 DIAGNOSIS — E1029 Type 1 diabetes mellitus with other diabetic kidney complication: Secondary | ICD-10-CM | POA: Diagnosis not present

## 2016-09-11 DIAGNOSIS — R739 Hyperglycemia, unspecified: Secondary | ICD-10-CM | POA: Diagnosis not present

## 2016-09-11 DIAGNOSIS — N2581 Secondary hyperparathyroidism of renal origin: Secondary | ICD-10-CM | POA: Diagnosis not present

## 2016-09-11 DIAGNOSIS — N186 End stage renal disease: Secondary | ICD-10-CM | POA: Diagnosis not present

## 2016-09-11 DIAGNOSIS — D509 Iron deficiency anemia, unspecified: Secondary | ICD-10-CM | POA: Diagnosis not present

## 2016-09-11 DIAGNOSIS — D631 Anemia in chronic kidney disease: Secondary | ICD-10-CM | POA: Diagnosis not present

## 2016-09-13 DIAGNOSIS — N2581 Secondary hyperparathyroidism of renal origin: Secondary | ICD-10-CM | POA: Diagnosis not present

## 2016-09-13 DIAGNOSIS — E1029 Type 1 diabetes mellitus with other diabetic kidney complication: Secondary | ICD-10-CM | POA: Diagnosis not present

## 2016-09-13 DIAGNOSIS — D509 Iron deficiency anemia, unspecified: Secondary | ICD-10-CM | POA: Diagnosis not present

## 2016-09-13 DIAGNOSIS — R739 Hyperglycemia, unspecified: Secondary | ICD-10-CM | POA: Diagnosis not present

## 2016-09-13 DIAGNOSIS — N186 End stage renal disease: Secondary | ICD-10-CM | POA: Diagnosis not present

## 2016-09-13 DIAGNOSIS — D631 Anemia in chronic kidney disease: Secondary | ICD-10-CM | POA: Diagnosis not present

## 2016-09-16 DIAGNOSIS — D509 Iron deficiency anemia, unspecified: Secondary | ICD-10-CM | POA: Diagnosis not present

## 2016-09-16 DIAGNOSIS — D631 Anemia in chronic kidney disease: Secondary | ICD-10-CM | POA: Diagnosis not present

## 2016-09-16 DIAGNOSIS — R739 Hyperglycemia, unspecified: Secondary | ICD-10-CM | POA: Diagnosis not present

## 2016-09-16 DIAGNOSIS — N2581 Secondary hyperparathyroidism of renal origin: Secondary | ICD-10-CM | POA: Diagnosis not present

## 2016-09-16 DIAGNOSIS — E1029 Type 1 diabetes mellitus with other diabetic kidney complication: Secondary | ICD-10-CM | POA: Diagnosis not present

## 2016-09-16 DIAGNOSIS — N186 End stage renal disease: Secondary | ICD-10-CM | POA: Diagnosis not present

## 2016-09-17 DIAGNOSIS — E1029 Type 1 diabetes mellitus with other diabetic kidney complication: Secondary | ICD-10-CM | POA: Diagnosis not present

## 2016-09-17 DIAGNOSIS — Z992 Dependence on renal dialysis: Secondary | ICD-10-CM | POA: Diagnosis not present

## 2016-09-17 DIAGNOSIS — N186 End stage renal disease: Secondary | ICD-10-CM | POA: Diagnosis not present

## 2016-09-18 DIAGNOSIS — N2581 Secondary hyperparathyroidism of renal origin: Secondary | ICD-10-CM | POA: Diagnosis not present

## 2016-09-18 DIAGNOSIS — R739 Hyperglycemia, unspecified: Secondary | ICD-10-CM | POA: Diagnosis not present

## 2016-09-18 DIAGNOSIS — E1029 Type 1 diabetes mellitus with other diabetic kidney complication: Secondary | ICD-10-CM | POA: Diagnosis not present

## 2016-09-18 DIAGNOSIS — N186 End stage renal disease: Secondary | ICD-10-CM | POA: Diagnosis not present

## 2016-09-18 DIAGNOSIS — D509 Iron deficiency anemia, unspecified: Secondary | ICD-10-CM | POA: Diagnosis not present

## 2016-09-18 DIAGNOSIS — D631 Anemia in chronic kidney disease: Secondary | ICD-10-CM | POA: Diagnosis not present

## 2016-09-19 DIAGNOSIS — K3184 Gastroparesis: Secondary | ICD-10-CM | POA: Diagnosis not present

## 2016-09-19 DIAGNOSIS — Z992 Dependence on renal dialysis: Secondary | ICD-10-CM | POA: Diagnosis not present

## 2016-09-19 DIAGNOSIS — Z794 Long term (current) use of insulin: Secondary | ICD-10-CM | POA: Diagnosis not present

## 2016-09-19 DIAGNOSIS — E1065 Type 1 diabetes mellitus with hyperglycemia: Secondary | ICD-10-CM | POA: Diagnosis not present

## 2016-09-19 DIAGNOSIS — E1043 Type 1 diabetes mellitus with diabetic autonomic (poly)neuropathy: Secondary | ICD-10-CM | POA: Diagnosis not present

## 2016-09-19 DIAGNOSIS — N186 End stage renal disease: Secondary | ICD-10-CM | POA: Diagnosis not present

## 2016-09-19 DIAGNOSIS — E103593 Type 1 diabetes mellitus with proliferative diabetic retinopathy without macular edema, bilateral: Secondary | ICD-10-CM | POA: Diagnosis not present

## 2016-09-20 DIAGNOSIS — N2581 Secondary hyperparathyroidism of renal origin: Secondary | ICD-10-CM | POA: Diagnosis not present

## 2016-09-20 DIAGNOSIS — E1029 Type 1 diabetes mellitus with other diabetic kidney complication: Secondary | ICD-10-CM | POA: Diagnosis not present

## 2016-09-20 DIAGNOSIS — R739 Hyperglycemia, unspecified: Secondary | ICD-10-CM | POA: Diagnosis not present

## 2016-09-20 DIAGNOSIS — D631 Anemia in chronic kidney disease: Secondary | ICD-10-CM | POA: Diagnosis not present

## 2016-09-20 DIAGNOSIS — D509 Iron deficiency anemia, unspecified: Secondary | ICD-10-CM | POA: Diagnosis not present

## 2016-09-20 DIAGNOSIS — N186 End stage renal disease: Secondary | ICD-10-CM | POA: Diagnosis not present

## 2016-09-23 DIAGNOSIS — D631 Anemia in chronic kidney disease: Secondary | ICD-10-CM | POA: Diagnosis not present

## 2016-09-23 DIAGNOSIS — D509 Iron deficiency anemia, unspecified: Secondary | ICD-10-CM | POA: Diagnosis not present

## 2016-09-23 DIAGNOSIS — N186 End stage renal disease: Secondary | ICD-10-CM | POA: Diagnosis not present

## 2016-09-23 DIAGNOSIS — R739 Hyperglycemia, unspecified: Secondary | ICD-10-CM | POA: Diagnosis not present

## 2016-09-23 DIAGNOSIS — E1029 Type 1 diabetes mellitus with other diabetic kidney complication: Secondary | ICD-10-CM | POA: Diagnosis not present

## 2016-09-23 DIAGNOSIS — N2581 Secondary hyperparathyroidism of renal origin: Secondary | ICD-10-CM | POA: Diagnosis not present

## 2016-09-25 DIAGNOSIS — R739 Hyperglycemia, unspecified: Secondary | ICD-10-CM | POA: Diagnosis not present

## 2016-09-25 DIAGNOSIS — D631 Anemia in chronic kidney disease: Secondary | ICD-10-CM | POA: Diagnosis not present

## 2016-09-25 DIAGNOSIS — N186 End stage renal disease: Secondary | ICD-10-CM | POA: Diagnosis not present

## 2016-09-25 DIAGNOSIS — E1029 Type 1 diabetes mellitus with other diabetic kidney complication: Secondary | ICD-10-CM | POA: Diagnosis not present

## 2016-09-25 DIAGNOSIS — D509 Iron deficiency anemia, unspecified: Secondary | ICD-10-CM | POA: Diagnosis not present

## 2016-09-25 DIAGNOSIS — N2581 Secondary hyperparathyroidism of renal origin: Secondary | ICD-10-CM | POA: Diagnosis not present

## 2016-09-27 DIAGNOSIS — D631 Anemia in chronic kidney disease: Secondary | ICD-10-CM | POA: Diagnosis not present

## 2016-09-27 DIAGNOSIS — D509 Iron deficiency anemia, unspecified: Secondary | ICD-10-CM | POA: Diagnosis not present

## 2016-09-27 DIAGNOSIS — N2581 Secondary hyperparathyroidism of renal origin: Secondary | ICD-10-CM | POA: Diagnosis not present

## 2016-09-27 DIAGNOSIS — E1029 Type 1 diabetes mellitus with other diabetic kidney complication: Secondary | ICD-10-CM | POA: Diagnosis not present

## 2016-09-27 DIAGNOSIS — R739 Hyperglycemia, unspecified: Secondary | ICD-10-CM | POA: Diagnosis not present

## 2016-09-27 DIAGNOSIS — N186 End stage renal disease: Secondary | ICD-10-CM | POA: Diagnosis not present

## 2016-09-30 DIAGNOSIS — D509 Iron deficiency anemia, unspecified: Secondary | ICD-10-CM | POA: Diagnosis not present

## 2016-09-30 DIAGNOSIS — N2581 Secondary hyperparathyroidism of renal origin: Secondary | ICD-10-CM | POA: Diagnosis not present

## 2016-09-30 DIAGNOSIS — D631 Anemia in chronic kidney disease: Secondary | ICD-10-CM | POA: Diagnosis not present

## 2016-09-30 DIAGNOSIS — R739 Hyperglycemia, unspecified: Secondary | ICD-10-CM | POA: Diagnosis not present

## 2016-09-30 DIAGNOSIS — N186 End stage renal disease: Secondary | ICD-10-CM | POA: Diagnosis not present

## 2016-09-30 DIAGNOSIS — E1029 Type 1 diabetes mellitus with other diabetic kidney complication: Secondary | ICD-10-CM | POA: Diagnosis not present

## 2016-10-02 DIAGNOSIS — D509 Iron deficiency anemia, unspecified: Secondary | ICD-10-CM | POA: Diagnosis not present

## 2016-10-02 DIAGNOSIS — D631 Anemia in chronic kidney disease: Secondary | ICD-10-CM | POA: Diagnosis not present

## 2016-10-02 DIAGNOSIS — N186 End stage renal disease: Secondary | ICD-10-CM | POA: Diagnosis not present

## 2016-10-02 DIAGNOSIS — N2581 Secondary hyperparathyroidism of renal origin: Secondary | ICD-10-CM | POA: Diagnosis not present

## 2016-10-02 DIAGNOSIS — R739 Hyperglycemia, unspecified: Secondary | ICD-10-CM | POA: Diagnosis not present

## 2016-10-02 DIAGNOSIS — E1029 Type 1 diabetes mellitus with other diabetic kidney complication: Secondary | ICD-10-CM | POA: Diagnosis not present

## 2016-10-04 DIAGNOSIS — D631 Anemia in chronic kidney disease: Secondary | ICD-10-CM | POA: Diagnosis not present

## 2016-10-04 DIAGNOSIS — E1029 Type 1 diabetes mellitus with other diabetic kidney complication: Secondary | ICD-10-CM | POA: Diagnosis not present

## 2016-10-04 DIAGNOSIS — N186 End stage renal disease: Secondary | ICD-10-CM | POA: Diagnosis not present

## 2016-10-04 DIAGNOSIS — N2581 Secondary hyperparathyroidism of renal origin: Secondary | ICD-10-CM | POA: Diagnosis not present

## 2016-10-04 DIAGNOSIS — D509 Iron deficiency anemia, unspecified: Secondary | ICD-10-CM | POA: Diagnosis not present

## 2016-10-04 DIAGNOSIS — R739 Hyperglycemia, unspecified: Secondary | ICD-10-CM | POA: Diagnosis not present

## 2016-10-06 ENCOUNTER — Encounter: Payer: Medicare Other | Admitting: Neurology

## 2016-10-07 DIAGNOSIS — D631 Anemia in chronic kidney disease: Secondary | ICD-10-CM | POA: Diagnosis not present

## 2016-10-07 DIAGNOSIS — E1029 Type 1 diabetes mellitus with other diabetic kidney complication: Secondary | ICD-10-CM | POA: Diagnosis not present

## 2016-10-07 DIAGNOSIS — N2581 Secondary hyperparathyroidism of renal origin: Secondary | ICD-10-CM | POA: Diagnosis not present

## 2016-10-07 DIAGNOSIS — D509 Iron deficiency anemia, unspecified: Secondary | ICD-10-CM | POA: Diagnosis not present

## 2016-10-07 DIAGNOSIS — N186 End stage renal disease: Secondary | ICD-10-CM | POA: Diagnosis not present

## 2016-10-07 DIAGNOSIS — R739 Hyperglycemia, unspecified: Secondary | ICD-10-CM | POA: Diagnosis not present

## 2016-10-09 DIAGNOSIS — D509 Iron deficiency anemia, unspecified: Secondary | ICD-10-CM | POA: Diagnosis not present

## 2016-10-09 DIAGNOSIS — N186 End stage renal disease: Secondary | ICD-10-CM | POA: Diagnosis not present

## 2016-10-09 DIAGNOSIS — E1029 Type 1 diabetes mellitus with other diabetic kidney complication: Secondary | ICD-10-CM | POA: Diagnosis not present

## 2016-10-09 DIAGNOSIS — R739 Hyperglycemia, unspecified: Secondary | ICD-10-CM | POA: Diagnosis not present

## 2016-10-09 DIAGNOSIS — N2581 Secondary hyperparathyroidism of renal origin: Secondary | ICD-10-CM | POA: Diagnosis not present

## 2016-10-09 DIAGNOSIS — D631 Anemia in chronic kidney disease: Secondary | ICD-10-CM | POA: Diagnosis not present

## 2016-10-11 DIAGNOSIS — N186 End stage renal disease: Secondary | ICD-10-CM | POA: Diagnosis not present

## 2016-10-11 DIAGNOSIS — D509 Iron deficiency anemia, unspecified: Secondary | ICD-10-CM | POA: Diagnosis not present

## 2016-10-11 DIAGNOSIS — E1029 Type 1 diabetes mellitus with other diabetic kidney complication: Secondary | ICD-10-CM | POA: Diagnosis not present

## 2016-10-11 DIAGNOSIS — N2581 Secondary hyperparathyroidism of renal origin: Secondary | ICD-10-CM | POA: Diagnosis not present

## 2016-10-11 DIAGNOSIS — R739 Hyperglycemia, unspecified: Secondary | ICD-10-CM | POA: Diagnosis not present

## 2016-10-11 DIAGNOSIS — D631 Anemia in chronic kidney disease: Secondary | ICD-10-CM | POA: Diagnosis not present

## 2016-10-14 DIAGNOSIS — R739 Hyperglycemia, unspecified: Secondary | ICD-10-CM | POA: Diagnosis not present

## 2016-10-14 DIAGNOSIS — D631 Anemia in chronic kidney disease: Secondary | ICD-10-CM | POA: Diagnosis not present

## 2016-10-14 DIAGNOSIS — N2581 Secondary hyperparathyroidism of renal origin: Secondary | ICD-10-CM | POA: Diagnosis not present

## 2016-10-14 DIAGNOSIS — N186 End stage renal disease: Secondary | ICD-10-CM | POA: Diagnosis not present

## 2016-10-14 DIAGNOSIS — E1029 Type 1 diabetes mellitus with other diabetic kidney complication: Secondary | ICD-10-CM | POA: Diagnosis not present

## 2016-10-14 DIAGNOSIS — D509 Iron deficiency anemia, unspecified: Secondary | ICD-10-CM | POA: Diagnosis not present

## 2016-10-15 ENCOUNTER — Ambulatory Visit: Payer: Self-pay | Admitting: Vascular Surgery

## 2016-10-16 DIAGNOSIS — N2581 Secondary hyperparathyroidism of renal origin: Secondary | ICD-10-CM | POA: Diagnosis not present

## 2016-10-16 DIAGNOSIS — E1029 Type 1 diabetes mellitus with other diabetic kidney complication: Secondary | ICD-10-CM | POA: Diagnosis not present

## 2016-10-16 DIAGNOSIS — R739 Hyperglycemia, unspecified: Secondary | ICD-10-CM | POA: Diagnosis not present

## 2016-10-16 DIAGNOSIS — N186 End stage renal disease: Secondary | ICD-10-CM | POA: Diagnosis not present

## 2016-10-16 DIAGNOSIS — D631 Anemia in chronic kidney disease: Secondary | ICD-10-CM | POA: Diagnosis not present

## 2016-10-16 DIAGNOSIS — D509 Iron deficiency anemia, unspecified: Secondary | ICD-10-CM | POA: Diagnosis not present

## 2016-10-18 DIAGNOSIS — N186 End stage renal disease: Secondary | ICD-10-CM | POA: Diagnosis not present

## 2016-10-18 DIAGNOSIS — E1029 Type 1 diabetes mellitus with other diabetic kidney complication: Secondary | ICD-10-CM | POA: Diagnosis not present

## 2016-10-18 DIAGNOSIS — D631 Anemia in chronic kidney disease: Secondary | ICD-10-CM | POA: Diagnosis not present

## 2016-10-18 DIAGNOSIS — N2581 Secondary hyperparathyroidism of renal origin: Secondary | ICD-10-CM | POA: Diagnosis not present

## 2016-10-18 DIAGNOSIS — D509 Iron deficiency anemia, unspecified: Secondary | ICD-10-CM | POA: Diagnosis not present

## 2016-10-18 DIAGNOSIS — Z992 Dependence on renal dialysis: Secondary | ICD-10-CM | POA: Diagnosis not present

## 2016-10-18 DIAGNOSIS — R739 Hyperglycemia, unspecified: Secondary | ICD-10-CM | POA: Diagnosis not present

## 2016-10-21 DIAGNOSIS — E1029 Type 1 diabetes mellitus with other diabetic kidney complication: Secondary | ICD-10-CM | POA: Diagnosis not present

## 2016-10-21 DIAGNOSIS — D631 Anemia in chronic kidney disease: Secondary | ICD-10-CM | POA: Diagnosis not present

## 2016-10-21 DIAGNOSIS — N186 End stage renal disease: Secondary | ICD-10-CM | POA: Diagnosis not present

## 2016-10-21 DIAGNOSIS — D509 Iron deficiency anemia, unspecified: Secondary | ICD-10-CM | POA: Diagnosis not present

## 2016-10-21 DIAGNOSIS — N2581 Secondary hyperparathyroidism of renal origin: Secondary | ICD-10-CM | POA: Diagnosis not present

## 2016-10-21 DIAGNOSIS — R739 Hyperglycemia, unspecified: Secondary | ICD-10-CM | POA: Diagnosis not present

## 2016-10-23 DIAGNOSIS — D509 Iron deficiency anemia, unspecified: Secondary | ICD-10-CM | POA: Diagnosis not present

## 2016-10-23 DIAGNOSIS — D631 Anemia in chronic kidney disease: Secondary | ICD-10-CM | POA: Diagnosis not present

## 2016-10-23 DIAGNOSIS — R739 Hyperglycemia, unspecified: Secondary | ICD-10-CM | POA: Diagnosis not present

## 2016-10-23 DIAGNOSIS — N2581 Secondary hyperparathyroidism of renal origin: Secondary | ICD-10-CM | POA: Diagnosis not present

## 2016-10-23 DIAGNOSIS — N186 End stage renal disease: Secondary | ICD-10-CM | POA: Diagnosis not present

## 2016-10-23 DIAGNOSIS — E1029 Type 1 diabetes mellitus with other diabetic kidney complication: Secondary | ICD-10-CM | POA: Diagnosis not present

## 2016-10-25 DIAGNOSIS — D509 Iron deficiency anemia, unspecified: Secondary | ICD-10-CM | POA: Diagnosis not present

## 2016-10-25 DIAGNOSIS — E1029 Type 1 diabetes mellitus with other diabetic kidney complication: Secondary | ICD-10-CM | POA: Diagnosis not present

## 2016-10-25 DIAGNOSIS — N2581 Secondary hyperparathyroidism of renal origin: Secondary | ICD-10-CM | POA: Diagnosis not present

## 2016-10-25 DIAGNOSIS — D631 Anemia in chronic kidney disease: Secondary | ICD-10-CM | POA: Diagnosis not present

## 2016-10-25 DIAGNOSIS — N186 End stage renal disease: Secondary | ICD-10-CM | POA: Diagnosis not present

## 2016-10-25 DIAGNOSIS — R739 Hyperglycemia, unspecified: Secondary | ICD-10-CM | POA: Diagnosis not present

## 2016-10-28 DIAGNOSIS — E1029 Type 1 diabetes mellitus with other diabetic kidney complication: Secondary | ICD-10-CM | POA: Diagnosis not present

## 2016-10-28 DIAGNOSIS — D509 Iron deficiency anemia, unspecified: Secondary | ICD-10-CM | POA: Diagnosis not present

## 2016-10-28 DIAGNOSIS — D631 Anemia in chronic kidney disease: Secondary | ICD-10-CM | POA: Diagnosis not present

## 2016-10-28 DIAGNOSIS — N186 End stage renal disease: Secondary | ICD-10-CM | POA: Diagnosis not present

## 2016-10-28 DIAGNOSIS — R739 Hyperglycemia, unspecified: Secondary | ICD-10-CM | POA: Diagnosis not present

## 2016-10-28 DIAGNOSIS — N2581 Secondary hyperparathyroidism of renal origin: Secondary | ICD-10-CM | POA: Diagnosis not present

## 2016-10-30 DIAGNOSIS — N186 End stage renal disease: Secondary | ICD-10-CM | POA: Diagnosis not present

## 2016-10-30 DIAGNOSIS — E1029 Type 1 diabetes mellitus with other diabetic kidney complication: Secondary | ICD-10-CM | POA: Diagnosis not present

## 2016-10-30 DIAGNOSIS — D631 Anemia in chronic kidney disease: Secondary | ICD-10-CM | POA: Diagnosis not present

## 2016-10-30 DIAGNOSIS — D509 Iron deficiency anemia, unspecified: Secondary | ICD-10-CM | POA: Diagnosis not present

## 2016-10-30 DIAGNOSIS — N2581 Secondary hyperparathyroidism of renal origin: Secondary | ICD-10-CM | POA: Diagnosis not present

## 2016-10-30 DIAGNOSIS — R739 Hyperglycemia, unspecified: Secondary | ICD-10-CM | POA: Diagnosis not present

## 2016-11-01 DIAGNOSIS — N186 End stage renal disease: Secondary | ICD-10-CM | POA: Diagnosis not present

## 2016-11-01 DIAGNOSIS — E1029 Type 1 diabetes mellitus with other diabetic kidney complication: Secondary | ICD-10-CM | POA: Diagnosis not present

## 2016-11-01 DIAGNOSIS — D631 Anemia in chronic kidney disease: Secondary | ICD-10-CM | POA: Diagnosis not present

## 2016-11-01 DIAGNOSIS — N2581 Secondary hyperparathyroidism of renal origin: Secondary | ICD-10-CM | POA: Diagnosis not present

## 2016-11-01 DIAGNOSIS — D509 Iron deficiency anemia, unspecified: Secondary | ICD-10-CM | POA: Diagnosis not present

## 2016-11-01 DIAGNOSIS — R739 Hyperglycemia, unspecified: Secondary | ICD-10-CM | POA: Diagnosis not present

## 2016-11-04 DIAGNOSIS — N2581 Secondary hyperparathyroidism of renal origin: Secondary | ICD-10-CM | POA: Diagnosis not present

## 2016-11-04 DIAGNOSIS — E1029 Type 1 diabetes mellitus with other diabetic kidney complication: Secondary | ICD-10-CM | POA: Diagnosis not present

## 2016-11-04 DIAGNOSIS — D509 Iron deficiency anemia, unspecified: Secondary | ICD-10-CM | POA: Diagnosis not present

## 2016-11-04 DIAGNOSIS — D631 Anemia in chronic kidney disease: Secondary | ICD-10-CM | POA: Diagnosis not present

## 2016-11-04 DIAGNOSIS — R739 Hyperglycemia, unspecified: Secondary | ICD-10-CM | POA: Diagnosis not present

## 2016-11-04 DIAGNOSIS — N186 End stage renal disease: Secondary | ICD-10-CM | POA: Diagnosis not present

## 2016-11-06 DIAGNOSIS — D509 Iron deficiency anemia, unspecified: Secondary | ICD-10-CM | POA: Diagnosis not present

## 2016-11-06 DIAGNOSIS — R739 Hyperglycemia, unspecified: Secondary | ICD-10-CM | POA: Diagnosis not present

## 2016-11-06 DIAGNOSIS — E1029 Type 1 diabetes mellitus with other diabetic kidney complication: Secondary | ICD-10-CM | POA: Diagnosis not present

## 2016-11-06 DIAGNOSIS — N186 End stage renal disease: Secondary | ICD-10-CM | POA: Diagnosis not present

## 2016-11-06 DIAGNOSIS — D631 Anemia in chronic kidney disease: Secondary | ICD-10-CM | POA: Diagnosis not present

## 2016-11-06 DIAGNOSIS — N2581 Secondary hyperparathyroidism of renal origin: Secondary | ICD-10-CM | POA: Diagnosis not present

## 2016-11-08 DIAGNOSIS — N2581 Secondary hyperparathyroidism of renal origin: Secondary | ICD-10-CM | POA: Diagnosis not present

## 2016-11-08 DIAGNOSIS — E1029 Type 1 diabetes mellitus with other diabetic kidney complication: Secondary | ICD-10-CM | POA: Diagnosis not present

## 2016-11-08 DIAGNOSIS — D631 Anemia in chronic kidney disease: Secondary | ICD-10-CM | POA: Diagnosis not present

## 2016-11-08 DIAGNOSIS — R739 Hyperglycemia, unspecified: Secondary | ICD-10-CM | POA: Diagnosis not present

## 2016-11-08 DIAGNOSIS — D509 Iron deficiency anemia, unspecified: Secondary | ICD-10-CM | POA: Diagnosis not present

## 2016-11-08 DIAGNOSIS — N186 End stage renal disease: Secondary | ICD-10-CM | POA: Diagnosis not present

## 2016-11-11 DIAGNOSIS — D509 Iron deficiency anemia, unspecified: Secondary | ICD-10-CM | POA: Diagnosis not present

## 2016-11-11 DIAGNOSIS — N186 End stage renal disease: Secondary | ICD-10-CM | POA: Diagnosis not present

## 2016-11-11 DIAGNOSIS — E1029 Type 1 diabetes mellitus with other diabetic kidney complication: Secondary | ICD-10-CM | POA: Diagnosis not present

## 2016-11-11 DIAGNOSIS — R739 Hyperglycemia, unspecified: Secondary | ICD-10-CM | POA: Diagnosis not present

## 2016-11-11 DIAGNOSIS — N2581 Secondary hyperparathyroidism of renal origin: Secondary | ICD-10-CM | POA: Diagnosis not present

## 2016-11-11 DIAGNOSIS — D631 Anemia in chronic kidney disease: Secondary | ICD-10-CM | POA: Diagnosis not present

## 2016-11-13 DIAGNOSIS — N186 End stage renal disease: Secondary | ICD-10-CM | POA: Diagnosis not present

## 2016-11-13 DIAGNOSIS — D631 Anemia in chronic kidney disease: Secondary | ICD-10-CM | POA: Diagnosis not present

## 2016-11-13 DIAGNOSIS — R739 Hyperglycemia, unspecified: Secondary | ICD-10-CM | POA: Diagnosis not present

## 2016-11-13 DIAGNOSIS — N2581 Secondary hyperparathyroidism of renal origin: Secondary | ICD-10-CM | POA: Diagnosis not present

## 2016-11-13 DIAGNOSIS — D509 Iron deficiency anemia, unspecified: Secondary | ICD-10-CM | POA: Diagnosis not present

## 2016-11-13 DIAGNOSIS — E1029 Type 1 diabetes mellitus with other diabetic kidney complication: Secondary | ICD-10-CM | POA: Diagnosis not present

## 2016-11-15 DIAGNOSIS — D631 Anemia in chronic kidney disease: Secondary | ICD-10-CM | POA: Diagnosis not present

## 2016-11-15 DIAGNOSIS — N186 End stage renal disease: Secondary | ICD-10-CM | POA: Diagnosis not present

## 2016-11-15 DIAGNOSIS — D509 Iron deficiency anemia, unspecified: Secondary | ICD-10-CM | POA: Diagnosis not present

## 2016-11-15 DIAGNOSIS — R739 Hyperglycemia, unspecified: Secondary | ICD-10-CM | POA: Diagnosis not present

## 2016-11-15 DIAGNOSIS — E1029 Type 1 diabetes mellitus with other diabetic kidney complication: Secondary | ICD-10-CM | POA: Diagnosis not present

## 2016-11-15 DIAGNOSIS — N2581 Secondary hyperparathyroidism of renal origin: Secondary | ICD-10-CM | POA: Diagnosis not present

## 2016-11-17 DIAGNOSIS — E1029 Type 1 diabetes mellitus with other diabetic kidney complication: Secondary | ICD-10-CM | POA: Diagnosis not present

## 2016-11-17 DIAGNOSIS — Z992 Dependence on renal dialysis: Secondary | ICD-10-CM | POA: Diagnosis not present

## 2016-11-17 DIAGNOSIS — N186 End stage renal disease: Secondary | ICD-10-CM | POA: Diagnosis not present

## 2016-11-18 DIAGNOSIS — D631 Anemia in chronic kidney disease: Secondary | ICD-10-CM | POA: Diagnosis not present

## 2016-11-18 DIAGNOSIS — N186 End stage renal disease: Secondary | ICD-10-CM | POA: Diagnosis not present

## 2016-11-18 DIAGNOSIS — E1029 Type 1 diabetes mellitus with other diabetic kidney complication: Secondary | ICD-10-CM | POA: Diagnosis not present

## 2016-11-18 DIAGNOSIS — R739 Hyperglycemia, unspecified: Secondary | ICD-10-CM | POA: Diagnosis not present

## 2016-11-18 DIAGNOSIS — N2581 Secondary hyperparathyroidism of renal origin: Secondary | ICD-10-CM | POA: Diagnosis not present

## 2016-11-20 DIAGNOSIS — D631 Anemia in chronic kidney disease: Secondary | ICD-10-CM | POA: Diagnosis not present

## 2016-11-20 DIAGNOSIS — E1029 Type 1 diabetes mellitus with other diabetic kidney complication: Secondary | ICD-10-CM | POA: Diagnosis not present

## 2016-11-20 DIAGNOSIS — N2581 Secondary hyperparathyroidism of renal origin: Secondary | ICD-10-CM | POA: Diagnosis not present

## 2016-11-20 DIAGNOSIS — N186 End stage renal disease: Secondary | ICD-10-CM | POA: Diagnosis not present

## 2016-11-20 DIAGNOSIS — R739 Hyperglycemia, unspecified: Secondary | ICD-10-CM | POA: Diagnosis not present

## 2016-11-22 DIAGNOSIS — N2581 Secondary hyperparathyroidism of renal origin: Secondary | ICD-10-CM | POA: Diagnosis not present

## 2016-11-22 DIAGNOSIS — D631 Anemia in chronic kidney disease: Secondary | ICD-10-CM | POA: Diagnosis not present

## 2016-11-22 DIAGNOSIS — E1029 Type 1 diabetes mellitus with other diabetic kidney complication: Secondary | ICD-10-CM | POA: Diagnosis not present

## 2016-11-22 DIAGNOSIS — N186 End stage renal disease: Secondary | ICD-10-CM | POA: Diagnosis not present

## 2016-11-22 DIAGNOSIS — R739 Hyperglycemia, unspecified: Secondary | ICD-10-CM | POA: Diagnosis not present

## 2016-11-24 DIAGNOSIS — N2581 Secondary hyperparathyroidism of renal origin: Secondary | ICD-10-CM | POA: Diagnosis not present

## 2016-11-24 DIAGNOSIS — E1029 Type 1 diabetes mellitus with other diabetic kidney complication: Secondary | ICD-10-CM | POA: Diagnosis not present

## 2016-11-24 DIAGNOSIS — N186 End stage renal disease: Secondary | ICD-10-CM | POA: Diagnosis not present

## 2016-11-24 DIAGNOSIS — D631 Anemia in chronic kidney disease: Secondary | ICD-10-CM | POA: Diagnosis not present

## 2016-11-24 DIAGNOSIS — R739 Hyperglycemia, unspecified: Secondary | ICD-10-CM | POA: Diagnosis not present

## 2016-11-27 DIAGNOSIS — N2581 Secondary hyperparathyroidism of renal origin: Secondary | ICD-10-CM | POA: Diagnosis not present

## 2016-11-27 DIAGNOSIS — R739 Hyperglycemia, unspecified: Secondary | ICD-10-CM | POA: Diagnosis not present

## 2016-11-27 DIAGNOSIS — D631 Anemia in chronic kidney disease: Secondary | ICD-10-CM | POA: Diagnosis not present

## 2016-11-27 DIAGNOSIS — E1029 Type 1 diabetes mellitus with other diabetic kidney complication: Secondary | ICD-10-CM | POA: Diagnosis not present

## 2016-11-27 DIAGNOSIS — N186 End stage renal disease: Secondary | ICD-10-CM | POA: Diagnosis not present

## 2016-11-29 DIAGNOSIS — D631 Anemia in chronic kidney disease: Secondary | ICD-10-CM | POA: Diagnosis not present

## 2016-11-29 DIAGNOSIS — N186 End stage renal disease: Secondary | ICD-10-CM | POA: Diagnosis not present

## 2016-11-29 DIAGNOSIS — R739 Hyperglycemia, unspecified: Secondary | ICD-10-CM | POA: Diagnosis not present

## 2016-11-29 DIAGNOSIS — E1029 Type 1 diabetes mellitus with other diabetic kidney complication: Secondary | ICD-10-CM | POA: Diagnosis not present

## 2016-11-29 DIAGNOSIS — N2581 Secondary hyperparathyroidism of renal origin: Secondary | ICD-10-CM | POA: Diagnosis not present

## 2016-12-02 DIAGNOSIS — D631 Anemia in chronic kidney disease: Secondary | ICD-10-CM | POA: Diagnosis not present

## 2016-12-02 DIAGNOSIS — N186 End stage renal disease: Secondary | ICD-10-CM | POA: Diagnosis not present

## 2016-12-02 DIAGNOSIS — E1029 Type 1 diabetes mellitus with other diabetic kidney complication: Secondary | ICD-10-CM | POA: Diagnosis not present

## 2016-12-02 DIAGNOSIS — R739 Hyperglycemia, unspecified: Secondary | ICD-10-CM | POA: Diagnosis not present

## 2016-12-02 DIAGNOSIS — N2581 Secondary hyperparathyroidism of renal origin: Secondary | ICD-10-CM | POA: Diagnosis not present

## 2016-12-04 DIAGNOSIS — R739 Hyperglycemia, unspecified: Secondary | ICD-10-CM | POA: Diagnosis not present

## 2016-12-04 DIAGNOSIS — E1029 Type 1 diabetes mellitus with other diabetic kidney complication: Secondary | ICD-10-CM | POA: Diagnosis not present

## 2016-12-04 DIAGNOSIS — N2581 Secondary hyperparathyroidism of renal origin: Secondary | ICD-10-CM | POA: Diagnosis not present

## 2016-12-04 DIAGNOSIS — N186 End stage renal disease: Secondary | ICD-10-CM | POA: Diagnosis not present

## 2016-12-04 DIAGNOSIS — D631 Anemia in chronic kidney disease: Secondary | ICD-10-CM | POA: Diagnosis not present

## 2016-12-06 DIAGNOSIS — E1029 Type 1 diabetes mellitus with other diabetic kidney complication: Secondary | ICD-10-CM | POA: Diagnosis not present

## 2016-12-06 DIAGNOSIS — D631 Anemia in chronic kidney disease: Secondary | ICD-10-CM | POA: Diagnosis not present

## 2016-12-06 DIAGNOSIS — N186 End stage renal disease: Secondary | ICD-10-CM | POA: Diagnosis not present

## 2016-12-06 DIAGNOSIS — N2581 Secondary hyperparathyroidism of renal origin: Secondary | ICD-10-CM | POA: Diagnosis not present

## 2016-12-06 DIAGNOSIS — R739 Hyperglycemia, unspecified: Secondary | ICD-10-CM | POA: Diagnosis not present

## 2016-12-09 DIAGNOSIS — D631 Anemia in chronic kidney disease: Secondary | ICD-10-CM | POA: Diagnosis not present

## 2016-12-09 DIAGNOSIS — N186 End stage renal disease: Secondary | ICD-10-CM | POA: Diagnosis not present

## 2016-12-09 DIAGNOSIS — N2581 Secondary hyperparathyroidism of renal origin: Secondary | ICD-10-CM | POA: Diagnosis not present

## 2016-12-09 DIAGNOSIS — R739 Hyperglycemia, unspecified: Secondary | ICD-10-CM | POA: Diagnosis not present

## 2016-12-09 DIAGNOSIS — E1029 Type 1 diabetes mellitus with other diabetic kidney complication: Secondary | ICD-10-CM | POA: Diagnosis not present

## 2016-12-11 DIAGNOSIS — R739 Hyperglycemia, unspecified: Secondary | ICD-10-CM | POA: Diagnosis not present

## 2016-12-11 DIAGNOSIS — N2581 Secondary hyperparathyroidism of renal origin: Secondary | ICD-10-CM | POA: Diagnosis not present

## 2016-12-11 DIAGNOSIS — E1029 Type 1 diabetes mellitus with other diabetic kidney complication: Secondary | ICD-10-CM | POA: Diagnosis not present

## 2016-12-11 DIAGNOSIS — N186 End stage renal disease: Secondary | ICD-10-CM | POA: Diagnosis not present

## 2016-12-11 DIAGNOSIS — D631 Anemia in chronic kidney disease: Secondary | ICD-10-CM | POA: Diagnosis not present

## 2016-12-13 DIAGNOSIS — R739 Hyperglycemia, unspecified: Secondary | ICD-10-CM | POA: Diagnosis not present

## 2016-12-13 DIAGNOSIS — N2581 Secondary hyperparathyroidism of renal origin: Secondary | ICD-10-CM | POA: Diagnosis not present

## 2016-12-13 DIAGNOSIS — E1029 Type 1 diabetes mellitus with other diabetic kidney complication: Secondary | ICD-10-CM | POA: Diagnosis not present

## 2016-12-13 DIAGNOSIS — N186 End stage renal disease: Secondary | ICD-10-CM | POA: Diagnosis not present

## 2016-12-13 DIAGNOSIS — D631 Anemia in chronic kidney disease: Secondary | ICD-10-CM | POA: Diagnosis not present

## 2016-12-16 DIAGNOSIS — N2581 Secondary hyperparathyroidism of renal origin: Secondary | ICD-10-CM | POA: Diagnosis not present

## 2016-12-16 DIAGNOSIS — R739 Hyperglycemia, unspecified: Secondary | ICD-10-CM | POA: Diagnosis not present

## 2016-12-16 DIAGNOSIS — E1029 Type 1 diabetes mellitus with other diabetic kidney complication: Secondary | ICD-10-CM | POA: Diagnosis not present

## 2016-12-16 DIAGNOSIS — D631 Anemia in chronic kidney disease: Secondary | ICD-10-CM | POA: Diagnosis not present

## 2016-12-16 DIAGNOSIS — N186 End stage renal disease: Secondary | ICD-10-CM | POA: Diagnosis not present

## 2016-12-18 DIAGNOSIS — D631 Anemia in chronic kidney disease: Secondary | ICD-10-CM | POA: Diagnosis not present

## 2016-12-18 DIAGNOSIS — N2581 Secondary hyperparathyroidism of renal origin: Secondary | ICD-10-CM | POA: Diagnosis not present

## 2016-12-18 DIAGNOSIS — E1029 Type 1 diabetes mellitus with other diabetic kidney complication: Secondary | ICD-10-CM | POA: Diagnosis not present

## 2016-12-18 DIAGNOSIS — R739 Hyperglycemia, unspecified: Secondary | ICD-10-CM | POA: Diagnosis not present

## 2016-12-18 DIAGNOSIS — Z992 Dependence on renal dialysis: Secondary | ICD-10-CM | POA: Diagnosis not present

## 2016-12-18 DIAGNOSIS — N186 End stage renal disease: Secondary | ICD-10-CM | POA: Diagnosis not present

## 2016-12-20 DIAGNOSIS — N186 End stage renal disease: Secondary | ICD-10-CM | POA: Diagnosis not present

## 2016-12-20 DIAGNOSIS — N2581 Secondary hyperparathyroidism of renal origin: Secondary | ICD-10-CM | POA: Diagnosis not present

## 2016-12-20 DIAGNOSIS — D631 Anemia in chronic kidney disease: Secondary | ICD-10-CM | POA: Diagnosis not present

## 2016-12-20 DIAGNOSIS — R739 Hyperglycemia, unspecified: Secondary | ICD-10-CM | POA: Diagnosis not present

## 2016-12-20 DIAGNOSIS — E1029 Type 1 diabetes mellitus with other diabetic kidney complication: Secondary | ICD-10-CM | POA: Diagnosis not present

## 2016-12-23 DIAGNOSIS — E1029 Type 1 diabetes mellitus with other diabetic kidney complication: Secondary | ICD-10-CM | POA: Diagnosis not present

## 2016-12-23 DIAGNOSIS — N186 End stage renal disease: Secondary | ICD-10-CM | POA: Diagnosis not present

## 2016-12-23 DIAGNOSIS — D631 Anemia in chronic kidney disease: Secondary | ICD-10-CM | POA: Diagnosis not present

## 2016-12-23 DIAGNOSIS — R739 Hyperglycemia, unspecified: Secondary | ICD-10-CM | POA: Diagnosis not present

## 2016-12-23 DIAGNOSIS — N2581 Secondary hyperparathyroidism of renal origin: Secondary | ICD-10-CM | POA: Diagnosis not present

## 2016-12-25 DIAGNOSIS — N2581 Secondary hyperparathyroidism of renal origin: Secondary | ICD-10-CM | POA: Diagnosis not present

## 2016-12-25 DIAGNOSIS — E1029 Type 1 diabetes mellitus with other diabetic kidney complication: Secondary | ICD-10-CM | POA: Diagnosis not present

## 2016-12-25 DIAGNOSIS — D631 Anemia in chronic kidney disease: Secondary | ICD-10-CM | POA: Diagnosis not present

## 2016-12-25 DIAGNOSIS — N186 End stage renal disease: Secondary | ICD-10-CM | POA: Diagnosis not present

## 2016-12-25 DIAGNOSIS — R739 Hyperglycemia, unspecified: Secondary | ICD-10-CM | POA: Diagnosis not present

## 2016-12-26 NOTE — Addendum Note (Signed)
Addendum  created 12/26/16 0321 by Lyn Hollingshead, MD   Sign clinical note

## 2016-12-27 DIAGNOSIS — N186 End stage renal disease: Secondary | ICD-10-CM | POA: Diagnosis not present

## 2016-12-27 DIAGNOSIS — R739 Hyperglycemia, unspecified: Secondary | ICD-10-CM | POA: Diagnosis not present

## 2016-12-27 DIAGNOSIS — E1029 Type 1 diabetes mellitus with other diabetic kidney complication: Secondary | ICD-10-CM | POA: Diagnosis not present

## 2016-12-27 DIAGNOSIS — N2581 Secondary hyperparathyroidism of renal origin: Secondary | ICD-10-CM | POA: Diagnosis not present

## 2016-12-27 DIAGNOSIS — D631 Anemia in chronic kidney disease: Secondary | ICD-10-CM | POA: Diagnosis not present

## 2016-12-30 DIAGNOSIS — N186 End stage renal disease: Secondary | ICD-10-CM | POA: Diagnosis not present

## 2016-12-30 DIAGNOSIS — E1029 Type 1 diabetes mellitus with other diabetic kidney complication: Secondary | ICD-10-CM | POA: Diagnosis not present

## 2016-12-30 DIAGNOSIS — R739 Hyperglycemia, unspecified: Secondary | ICD-10-CM | POA: Diagnosis not present

## 2016-12-30 DIAGNOSIS — N2581 Secondary hyperparathyroidism of renal origin: Secondary | ICD-10-CM | POA: Diagnosis not present

## 2016-12-30 DIAGNOSIS — D631 Anemia in chronic kidney disease: Secondary | ICD-10-CM | POA: Diagnosis not present

## 2017-01-01 DIAGNOSIS — R739 Hyperglycemia, unspecified: Secondary | ICD-10-CM | POA: Diagnosis not present

## 2017-01-01 DIAGNOSIS — N186 End stage renal disease: Secondary | ICD-10-CM | POA: Diagnosis not present

## 2017-01-01 DIAGNOSIS — D631 Anemia in chronic kidney disease: Secondary | ICD-10-CM | POA: Diagnosis not present

## 2017-01-01 DIAGNOSIS — N2581 Secondary hyperparathyroidism of renal origin: Secondary | ICD-10-CM | POA: Diagnosis not present

## 2017-01-01 DIAGNOSIS — E1029 Type 1 diabetes mellitus with other diabetic kidney complication: Secondary | ICD-10-CM | POA: Diagnosis not present

## 2017-01-03 DIAGNOSIS — N2581 Secondary hyperparathyroidism of renal origin: Secondary | ICD-10-CM | POA: Diagnosis not present

## 2017-01-03 DIAGNOSIS — D631 Anemia in chronic kidney disease: Secondary | ICD-10-CM | POA: Diagnosis not present

## 2017-01-03 DIAGNOSIS — E1029 Type 1 diabetes mellitus with other diabetic kidney complication: Secondary | ICD-10-CM | POA: Diagnosis not present

## 2017-01-03 DIAGNOSIS — R739 Hyperglycemia, unspecified: Secondary | ICD-10-CM | POA: Diagnosis not present

## 2017-01-03 DIAGNOSIS — N186 End stage renal disease: Secondary | ICD-10-CM | POA: Diagnosis not present

## 2017-01-06 DIAGNOSIS — N186 End stage renal disease: Secondary | ICD-10-CM | POA: Diagnosis not present

## 2017-01-06 DIAGNOSIS — D631 Anemia in chronic kidney disease: Secondary | ICD-10-CM | POA: Diagnosis not present

## 2017-01-06 DIAGNOSIS — E1029 Type 1 diabetes mellitus with other diabetic kidney complication: Secondary | ICD-10-CM | POA: Diagnosis not present

## 2017-01-06 DIAGNOSIS — R739 Hyperglycemia, unspecified: Secondary | ICD-10-CM | POA: Diagnosis not present

## 2017-01-06 DIAGNOSIS — N2581 Secondary hyperparathyroidism of renal origin: Secondary | ICD-10-CM | POA: Diagnosis not present

## 2017-01-08 DIAGNOSIS — N186 End stage renal disease: Secondary | ICD-10-CM | POA: Diagnosis not present

## 2017-01-08 DIAGNOSIS — N2581 Secondary hyperparathyroidism of renal origin: Secondary | ICD-10-CM | POA: Diagnosis not present

## 2017-01-08 DIAGNOSIS — D631 Anemia in chronic kidney disease: Secondary | ICD-10-CM | POA: Diagnosis not present

## 2017-01-08 DIAGNOSIS — E1029 Type 1 diabetes mellitus with other diabetic kidney complication: Secondary | ICD-10-CM | POA: Diagnosis not present

## 2017-01-08 DIAGNOSIS — R739 Hyperglycemia, unspecified: Secondary | ICD-10-CM | POA: Diagnosis not present

## 2017-01-10 DIAGNOSIS — E1029 Type 1 diabetes mellitus with other diabetic kidney complication: Secondary | ICD-10-CM | POA: Diagnosis not present

## 2017-01-10 DIAGNOSIS — N2581 Secondary hyperparathyroidism of renal origin: Secondary | ICD-10-CM | POA: Diagnosis not present

## 2017-01-10 DIAGNOSIS — R739 Hyperglycemia, unspecified: Secondary | ICD-10-CM | POA: Diagnosis not present

## 2017-01-10 DIAGNOSIS — D631 Anemia in chronic kidney disease: Secondary | ICD-10-CM | POA: Diagnosis not present

## 2017-01-10 DIAGNOSIS — N186 End stage renal disease: Secondary | ICD-10-CM | POA: Diagnosis not present

## 2017-01-13 DIAGNOSIS — N2581 Secondary hyperparathyroidism of renal origin: Secondary | ICD-10-CM | POA: Diagnosis not present

## 2017-01-13 DIAGNOSIS — D631 Anemia in chronic kidney disease: Secondary | ICD-10-CM | POA: Diagnosis not present

## 2017-01-13 DIAGNOSIS — N186 End stage renal disease: Secondary | ICD-10-CM | POA: Diagnosis not present

## 2017-01-13 DIAGNOSIS — E1029 Type 1 diabetes mellitus with other diabetic kidney complication: Secondary | ICD-10-CM | POA: Diagnosis not present

## 2017-01-13 DIAGNOSIS — R739 Hyperglycemia, unspecified: Secondary | ICD-10-CM | POA: Diagnosis not present

## 2017-01-14 ENCOUNTER — Ambulatory Visit: Payer: Medicare Other | Admitting: Neurology

## 2017-01-15 DIAGNOSIS — N186 End stage renal disease: Secondary | ICD-10-CM | POA: Diagnosis not present

## 2017-01-15 DIAGNOSIS — E1029 Type 1 diabetes mellitus with other diabetic kidney complication: Secondary | ICD-10-CM | POA: Diagnosis not present

## 2017-01-15 DIAGNOSIS — D631 Anemia in chronic kidney disease: Secondary | ICD-10-CM | POA: Diagnosis not present

## 2017-01-15 DIAGNOSIS — R739 Hyperglycemia, unspecified: Secondary | ICD-10-CM | POA: Diagnosis not present

## 2017-01-15 DIAGNOSIS — N2581 Secondary hyperparathyroidism of renal origin: Secondary | ICD-10-CM | POA: Diagnosis not present

## 2017-01-17 DIAGNOSIS — D631 Anemia in chronic kidney disease: Secondary | ICD-10-CM | POA: Diagnosis not present

## 2017-01-17 DIAGNOSIS — N2581 Secondary hyperparathyroidism of renal origin: Secondary | ICD-10-CM | POA: Diagnosis not present

## 2017-01-17 DIAGNOSIS — E1029 Type 1 diabetes mellitus with other diabetic kidney complication: Secondary | ICD-10-CM | POA: Diagnosis not present

## 2017-01-17 DIAGNOSIS — Z992 Dependence on renal dialysis: Secondary | ICD-10-CM | POA: Diagnosis not present

## 2017-01-17 DIAGNOSIS — N186 End stage renal disease: Secondary | ICD-10-CM | POA: Diagnosis not present

## 2017-01-17 DIAGNOSIS — R739 Hyperglycemia, unspecified: Secondary | ICD-10-CM | POA: Diagnosis not present

## 2017-01-20 DIAGNOSIS — R739 Hyperglycemia, unspecified: Secondary | ICD-10-CM | POA: Diagnosis not present

## 2017-01-20 DIAGNOSIS — E1029 Type 1 diabetes mellitus with other diabetic kidney complication: Secondary | ICD-10-CM | POA: Diagnosis not present

## 2017-01-20 DIAGNOSIS — D631 Anemia in chronic kidney disease: Secondary | ICD-10-CM | POA: Diagnosis not present

## 2017-01-20 DIAGNOSIS — N2581 Secondary hyperparathyroidism of renal origin: Secondary | ICD-10-CM | POA: Diagnosis not present

## 2017-01-20 DIAGNOSIS — N186 End stage renal disease: Secondary | ICD-10-CM | POA: Diagnosis not present

## 2017-01-22 DIAGNOSIS — R739 Hyperglycemia, unspecified: Secondary | ICD-10-CM | POA: Diagnosis not present

## 2017-01-22 DIAGNOSIS — E1029 Type 1 diabetes mellitus with other diabetic kidney complication: Secondary | ICD-10-CM | POA: Diagnosis not present

## 2017-01-22 DIAGNOSIS — N2581 Secondary hyperparathyroidism of renal origin: Secondary | ICD-10-CM | POA: Diagnosis not present

## 2017-01-22 DIAGNOSIS — N186 End stage renal disease: Secondary | ICD-10-CM | POA: Diagnosis not present

## 2017-01-22 DIAGNOSIS — D631 Anemia in chronic kidney disease: Secondary | ICD-10-CM | POA: Diagnosis not present

## 2017-01-24 DIAGNOSIS — R739 Hyperglycemia, unspecified: Secondary | ICD-10-CM | POA: Diagnosis not present

## 2017-01-24 DIAGNOSIS — N2581 Secondary hyperparathyroidism of renal origin: Secondary | ICD-10-CM | POA: Diagnosis not present

## 2017-01-24 DIAGNOSIS — E1029 Type 1 diabetes mellitus with other diabetic kidney complication: Secondary | ICD-10-CM | POA: Diagnosis not present

## 2017-01-24 DIAGNOSIS — N186 End stage renal disease: Secondary | ICD-10-CM | POA: Diagnosis not present

## 2017-01-24 DIAGNOSIS — D631 Anemia in chronic kidney disease: Secondary | ICD-10-CM | POA: Diagnosis not present

## 2017-01-27 DIAGNOSIS — N2581 Secondary hyperparathyroidism of renal origin: Secondary | ICD-10-CM | POA: Diagnosis not present

## 2017-01-27 DIAGNOSIS — N186 End stage renal disease: Secondary | ICD-10-CM | POA: Diagnosis not present

## 2017-01-27 DIAGNOSIS — E1029 Type 1 diabetes mellitus with other diabetic kidney complication: Secondary | ICD-10-CM | POA: Diagnosis not present

## 2017-01-27 DIAGNOSIS — R739 Hyperglycemia, unspecified: Secondary | ICD-10-CM | POA: Diagnosis not present

## 2017-01-27 DIAGNOSIS — D631 Anemia in chronic kidney disease: Secondary | ICD-10-CM | POA: Diagnosis not present

## 2017-01-29 DIAGNOSIS — R739 Hyperglycemia, unspecified: Secondary | ICD-10-CM | POA: Diagnosis not present

## 2017-01-29 DIAGNOSIS — D631 Anemia in chronic kidney disease: Secondary | ICD-10-CM | POA: Diagnosis not present

## 2017-01-29 DIAGNOSIS — N186 End stage renal disease: Secondary | ICD-10-CM | POA: Diagnosis not present

## 2017-01-29 DIAGNOSIS — N2581 Secondary hyperparathyroidism of renal origin: Secondary | ICD-10-CM | POA: Diagnosis not present

## 2017-01-29 DIAGNOSIS — E1029 Type 1 diabetes mellitus with other diabetic kidney complication: Secondary | ICD-10-CM | POA: Diagnosis not present

## 2017-01-30 DIAGNOSIS — E1022 Type 1 diabetes mellitus with diabetic chronic kidney disease: Secondary | ICD-10-CM | POA: Diagnosis not present

## 2017-01-30 DIAGNOSIS — Z992 Dependence on renal dialysis: Secondary | ICD-10-CM | POA: Diagnosis not present

## 2017-01-30 DIAGNOSIS — Z794 Long term (current) use of insulin: Secondary | ICD-10-CM | POA: Diagnosis not present

## 2017-01-30 DIAGNOSIS — E103593 Type 1 diabetes mellitus with proliferative diabetic retinopathy without macular edema, bilateral: Secondary | ICD-10-CM | POA: Diagnosis not present

## 2017-01-30 DIAGNOSIS — N186 End stage renal disease: Secondary | ICD-10-CM | POA: Diagnosis not present

## 2017-01-31 DIAGNOSIS — D631 Anemia in chronic kidney disease: Secondary | ICD-10-CM | POA: Diagnosis not present

## 2017-01-31 DIAGNOSIS — N2581 Secondary hyperparathyroidism of renal origin: Secondary | ICD-10-CM | POA: Diagnosis not present

## 2017-01-31 DIAGNOSIS — E1029 Type 1 diabetes mellitus with other diabetic kidney complication: Secondary | ICD-10-CM | POA: Diagnosis not present

## 2017-01-31 DIAGNOSIS — R739 Hyperglycemia, unspecified: Secondary | ICD-10-CM | POA: Diagnosis not present

## 2017-01-31 DIAGNOSIS — N186 End stage renal disease: Secondary | ICD-10-CM | POA: Diagnosis not present

## 2017-02-02 DIAGNOSIS — Z794 Long term (current) use of insulin: Secondary | ICD-10-CM | POA: Diagnosis not present

## 2017-02-02 DIAGNOSIS — H35373 Puckering of macula, bilateral: Secondary | ICD-10-CM | POA: Diagnosis not present

## 2017-02-02 DIAGNOSIS — E103593 Type 1 diabetes mellitus with proliferative diabetic retinopathy without macular edema, bilateral: Secondary | ICD-10-CM | POA: Diagnosis not present

## 2017-02-02 DIAGNOSIS — H5213 Myopia, bilateral: Secondary | ICD-10-CM | POA: Diagnosis not present

## 2017-02-02 DIAGNOSIS — H2513 Age-related nuclear cataract, bilateral: Secondary | ICD-10-CM | POA: Diagnosis not present

## 2017-02-03 DIAGNOSIS — D631 Anemia in chronic kidney disease: Secondary | ICD-10-CM | POA: Diagnosis not present

## 2017-02-03 DIAGNOSIS — N186 End stage renal disease: Secondary | ICD-10-CM | POA: Diagnosis not present

## 2017-02-03 DIAGNOSIS — R739 Hyperglycemia, unspecified: Secondary | ICD-10-CM | POA: Diagnosis not present

## 2017-02-03 DIAGNOSIS — E1029 Type 1 diabetes mellitus with other diabetic kidney complication: Secondary | ICD-10-CM | POA: Diagnosis not present

## 2017-02-03 DIAGNOSIS — N2581 Secondary hyperparathyroidism of renal origin: Secondary | ICD-10-CM | POA: Diagnosis not present

## 2017-02-05 DIAGNOSIS — N2581 Secondary hyperparathyroidism of renal origin: Secondary | ICD-10-CM | POA: Diagnosis not present

## 2017-02-05 DIAGNOSIS — N186 End stage renal disease: Secondary | ICD-10-CM | POA: Diagnosis not present

## 2017-02-05 DIAGNOSIS — D631 Anemia in chronic kidney disease: Secondary | ICD-10-CM | POA: Diagnosis not present

## 2017-02-05 DIAGNOSIS — E1029 Type 1 diabetes mellitus with other diabetic kidney complication: Secondary | ICD-10-CM | POA: Diagnosis not present

## 2017-02-05 DIAGNOSIS — R739 Hyperglycemia, unspecified: Secondary | ICD-10-CM | POA: Diagnosis not present

## 2017-02-07 DIAGNOSIS — R739 Hyperglycemia, unspecified: Secondary | ICD-10-CM | POA: Diagnosis not present

## 2017-02-07 DIAGNOSIS — D631 Anemia in chronic kidney disease: Secondary | ICD-10-CM | POA: Diagnosis not present

## 2017-02-07 DIAGNOSIS — N186 End stage renal disease: Secondary | ICD-10-CM | POA: Diagnosis not present

## 2017-02-07 DIAGNOSIS — N2581 Secondary hyperparathyroidism of renal origin: Secondary | ICD-10-CM | POA: Diagnosis not present

## 2017-02-07 DIAGNOSIS — E1029 Type 1 diabetes mellitus with other diabetic kidney complication: Secondary | ICD-10-CM | POA: Diagnosis not present

## 2017-02-10 DIAGNOSIS — R739 Hyperglycemia, unspecified: Secondary | ICD-10-CM | POA: Diagnosis not present

## 2017-02-10 DIAGNOSIS — E1029 Type 1 diabetes mellitus with other diabetic kidney complication: Secondary | ICD-10-CM | POA: Diagnosis not present

## 2017-02-10 DIAGNOSIS — N2581 Secondary hyperparathyroidism of renal origin: Secondary | ICD-10-CM | POA: Diagnosis not present

## 2017-02-10 DIAGNOSIS — N186 End stage renal disease: Secondary | ICD-10-CM | POA: Diagnosis not present

## 2017-02-10 DIAGNOSIS — D631 Anemia in chronic kidney disease: Secondary | ICD-10-CM | POA: Diagnosis not present

## 2017-02-12 DIAGNOSIS — N2581 Secondary hyperparathyroidism of renal origin: Secondary | ICD-10-CM | POA: Diagnosis not present

## 2017-02-12 DIAGNOSIS — N186 End stage renal disease: Secondary | ICD-10-CM | POA: Diagnosis not present

## 2017-02-12 DIAGNOSIS — R739 Hyperglycemia, unspecified: Secondary | ICD-10-CM | POA: Diagnosis not present

## 2017-02-12 DIAGNOSIS — D631 Anemia in chronic kidney disease: Secondary | ICD-10-CM | POA: Diagnosis not present

## 2017-02-12 DIAGNOSIS — E1029 Type 1 diabetes mellitus with other diabetic kidney complication: Secondary | ICD-10-CM | POA: Diagnosis not present

## 2017-02-14 DIAGNOSIS — E1029 Type 1 diabetes mellitus with other diabetic kidney complication: Secondary | ICD-10-CM | POA: Diagnosis not present

## 2017-02-14 DIAGNOSIS — N186 End stage renal disease: Secondary | ICD-10-CM | POA: Diagnosis not present

## 2017-02-14 DIAGNOSIS — R739 Hyperglycemia, unspecified: Secondary | ICD-10-CM | POA: Diagnosis not present

## 2017-02-14 DIAGNOSIS — N2581 Secondary hyperparathyroidism of renal origin: Secondary | ICD-10-CM | POA: Diagnosis not present

## 2017-02-14 DIAGNOSIS — D631 Anemia in chronic kidney disease: Secondary | ICD-10-CM | POA: Diagnosis not present

## 2017-02-17 DIAGNOSIS — N186 End stage renal disease: Secondary | ICD-10-CM | POA: Diagnosis not present

## 2017-02-17 DIAGNOSIS — D631 Anemia in chronic kidney disease: Secondary | ICD-10-CM | POA: Diagnosis not present

## 2017-02-17 DIAGNOSIS — N2581 Secondary hyperparathyroidism of renal origin: Secondary | ICD-10-CM | POA: Diagnosis not present

## 2017-02-17 DIAGNOSIS — Z992 Dependence on renal dialysis: Secondary | ICD-10-CM | POA: Diagnosis not present

## 2017-02-17 DIAGNOSIS — R739 Hyperglycemia, unspecified: Secondary | ICD-10-CM | POA: Diagnosis not present

## 2017-02-17 DIAGNOSIS — E1029 Type 1 diabetes mellitus with other diabetic kidney complication: Secondary | ICD-10-CM | POA: Diagnosis not present

## 2017-02-19 DIAGNOSIS — E1029 Type 1 diabetes mellitus with other diabetic kidney complication: Secondary | ICD-10-CM | POA: Diagnosis not present

## 2017-02-19 DIAGNOSIS — N186 End stage renal disease: Secondary | ICD-10-CM | POA: Diagnosis not present

## 2017-02-19 DIAGNOSIS — D631 Anemia in chronic kidney disease: Secondary | ICD-10-CM | POA: Diagnosis not present

## 2017-02-19 DIAGNOSIS — N2581 Secondary hyperparathyroidism of renal origin: Secondary | ICD-10-CM | POA: Diagnosis not present

## 2017-02-19 DIAGNOSIS — R739 Hyperglycemia, unspecified: Secondary | ICD-10-CM | POA: Diagnosis not present

## 2017-02-21 DIAGNOSIS — N186 End stage renal disease: Secondary | ICD-10-CM | POA: Diagnosis not present

## 2017-02-21 DIAGNOSIS — E1029 Type 1 diabetes mellitus with other diabetic kidney complication: Secondary | ICD-10-CM | POA: Diagnosis not present

## 2017-02-21 DIAGNOSIS — N2581 Secondary hyperparathyroidism of renal origin: Secondary | ICD-10-CM | POA: Diagnosis not present

## 2017-02-21 DIAGNOSIS — R739 Hyperglycemia, unspecified: Secondary | ICD-10-CM | POA: Diagnosis not present

## 2017-02-21 DIAGNOSIS — D631 Anemia in chronic kidney disease: Secondary | ICD-10-CM | POA: Diagnosis not present

## 2017-02-24 DIAGNOSIS — E1029 Type 1 diabetes mellitus with other diabetic kidney complication: Secondary | ICD-10-CM | POA: Diagnosis not present

## 2017-02-24 DIAGNOSIS — D631 Anemia in chronic kidney disease: Secondary | ICD-10-CM | POA: Diagnosis not present

## 2017-02-24 DIAGNOSIS — N186 End stage renal disease: Secondary | ICD-10-CM | POA: Diagnosis not present

## 2017-02-24 DIAGNOSIS — R739 Hyperglycemia, unspecified: Secondary | ICD-10-CM | POA: Diagnosis not present

## 2017-02-24 DIAGNOSIS — N2581 Secondary hyperparathyroidism of renal origin: Secondary | ICD-10-CM | POA: Diagnosis not present

## 2017-02-26 DIAGNOSIS — N2581 Secondary hyperparathyroidism of renal origin: Secondary | ICD-10-CM | POA: Diagnosis not present

## 2017-02-26 DIAGNOSIS — D631 Anemia in chronic kidney disease: Secondary | ICD-10-CM | POA: Diagnosis not present

## 2017-02-26 DIAGNOSIS — N186 End stage renal disease: Secondary | ICD-10-CM | POA: Diagnosis not present

## 2017-02-26 DIAGNOSIS — R739 Hyperglycemia, unspecified: Secondary | ICD-10-CM | POA: Diagnosis not present

## 2017-02-26 DIAGNOSIS — E1029 Type 1 diabetes mellitus with other diabetic kidney complication: Secondary | ICD-10-CM | POA: Diagnosis not present

## 2017-02-28 DIAGNOSIS — E1029 Type 1 diabetes mellitus with other diabetic kidney complication: Secondary | ICD-10-CM | POA: Diagnosis not present

## 2017-02-28 DIAGNOSIS — N2581 Secondary hyperparathyroidism of renal origin: Secondary | ICD-10-CM | POA: Diagnosis not present

## 2017-02-28 DIAGNOSIS — D631 Anemia in chronic kidney disease: Secondary | ICD-10-CM | POA: Diagnosis not present

## 2017-02-28 DIAGNOSIS — N186 End stage renal disease: Secondary | ICD-10-CM | POA: Diagnosis not present

## 2017-02-28 DIAGNOSIS — R739 Hyperglycemia, unspecified: Secondary | ICD-10-CM | POA: Diagnosis not present

## 2017-03-03 DIAGNOSIS — E1029 Type 1 diabetes mellitus with other diabetic kidney complication: Secondary | ICD-10-CM | POA: Diagnosis not present

## 2017-03-03 DIAGNOSIS — N2581 Secondary hyperparathyroidism of renal origin: Secondary | ICD-10-CM | POA: Diagnosis not present

## 2017-03-03 DIAGNOSIS — D631 Anemia in chronic kidney disease: Secondary | ICD-10-CM | POA: Diagnosis not present

## 2017-03-03 DIAGNOSIS — N186 End stage renal disease: Secondary | ICD-10-CM | POA: Diagnosis not present

## 2017-03-03 DIAGNOSIS — R739 Hyperglycemia, unspecified: Secondary | ICD-10-CM | POA: Diagnosis not present

## 2017-03-05 DIAGNOSIS — R739 Hyperglycemia, unspecified: Secondary | ICD-10-CM | POA: Diagnosis not present

## 2017-03-05 DIAGNOSIS — E1029 Type 1 diabetes mellitus with other diabetic kidney complication: Secondary | ICD-10-CM | POA: Diagnosis not present

## 2017-03-05 DIAGNOSIS — D631 Anemia in chronic kidney disease: Secondary | ICD-10-CM | POA: Diagnosis not present

## 2017-03-05 DIAGNOSIS — N2581 Secondary hyperparathyroidism of renal origin: Secondary | ICD-10-CM | POA: Diagnosis not present

## 2017-03-05 DIAGNOSIS — N186 End stage renal disease: Secondary | ICD-10-CM | POA: Diagnosis not present

## 2017-03-07 DIAGNOSIS — R739 Hyperglycemia, unspecified: Secondary | ICD-10-CM | POA: Diagnosis not present

## 2017-03-07 DIAGNOSIS — E1029 Type 1 diabetes mellitus with other diabetic kidney complication: Secondary | ICD-10-CM | POA: Diagnosis not present

## 2017-03-07 DIAGNOSIS — D631 Anemia in chronic kidney disease: Secondary | ICD-10-CM | POA: Diagnosis not present

## 2017-03-07 DIAGNOSIS — N2581 Secondary hyperparathyroidism of renal origin: Secondary | ICD-10-CM | POA: Diagnosis not present

## 2017-03-07 DIAGNOSIS — N186 End stage renal disease: Secondary | ICD-10-CM | POA: Diagnosis not present

## 2017-03-09 DIAGNOSIS — Z794 Long term (current) use of insulin: Secondary | ICD-10-CM | POA: Diagnosis not present

## 2017-03-09 DIAGNOSIS — N186 End stage renal disease: Secondary | ICD-10-CM | POA: Diagnosis not present

## 2017-03-09 DIAGNOSIS — E538 Deficiency of other specified B group vitamins: Secondary | ICD-10-CM | POA: Diagnosis not present

## 2017-03-09 DIAGNOSIS — E119 Type 2 diabetes mellitus without complications: Secondary | ICD-10-CM | POA: Diagnosis not present

## 2017-03-09 DIAGNOSIS — Z992 Dependence on renal dialysis: Secondary | ICD-10-CM | POA: Diagnosis not present

## 2017-03-09 DIAGNOSIS — D638 Anemia in other chronic diseases classified elsewhere: Secondary | ICD-10-CM | POA: Diagnosis not present

## 2017-03-09 DIAGNOSIS — I10 Essential (primary) hypertension: Secondary | ICD-10-CM | POA: Diagnosis not present

## 2017-03-09 DIAGNOSIS — Z0000 Encounter for general adult medical examination without abnormal findings: Secondary | ICD-10-CM | POA: Diagnosis not present

## 2017-03-09 DIAGNOSIS — E785 Hyperlipidemia, unspecified: Secondary | ICD-10-CM | POA: Diagnosis not present

## 2017-03-09 DIAGNOSIS — G629 Polyneuropathy, unspecified: Secondary | ICD-10-CM | POA: Diagnosis not present

## 2017-03-09 DIAGNOSIS — I119 Hypertensive heart disease without heart failure: Secondary | ICD-10-CM | POA: Diagnosis not present

## 2017-03-10 DIAGNOSIS — E1029 Type 1 diabetes mellitus with other diabetic kidney complication: Secondary | ICD-10-CM | POA: Diagnosis not present

## 2017-03-10 DIAGNOSIS — N2581 Secondary hyperparathyroidism of renal origin: Secondary | ICD-10-CM | POA: Diagnosis not present

## 2017-03-10 DIAGNOSIS — R739 Hyperglycemia, unspecified: Secondary | ICD-10-CM | POA: Diagnosis not present

## 2017-03-10 DIAGNOSIS — D631 Anemia in chronic kidney disease: Secondary | ICD-10-CM | POA: Diagnosis not present

## 2017-03-10 DIAGNOSIS — N186 End stage renal disease: Secondary | ICD-10-CM | POA: Diagnosis not present

## 2017-03-11 ENCOUNTER — Encounter: Payer: Self-pay | Admitting: Neurology

## 2017-03-11 ENCOUNTER — Ambulatory Visit (INDEPENDENT_AMBULATORY_CARE_PROVIDER_SITE_OTHER): Payer: Medicare Other | Admitting: Neurology

## 2017-03-11 VITALS — BP 100/63 | HR 83 | Ht 65.0 in | Wt 120.1 lb

## 2017-03-11 DIAGNOSIS — M79642 Pain in left hand: Secondary | ICD-10-CM | POA: Diagnosis not present

## 2017-03-11 DIAGNOSIS — Z8673 Personal history of transient ischemic attack (TIA), and cerebral infarction without residual deficits: Secondary | ICD-10-CM | POA: Insufficient documentation

## 2017-03-11 DIAGNOSIS — N186 End stage renal disease: Secondary | ICD-10-CM | POA: Diagnosis not present

## 2017-03-11 DIAGNOSIS — E1059 Type 1 diabetes mellitus with other circulatory complications: Secondary | ICD-10-CM | POA: Diagnosis not present

## 2017-03-11 DIAGNOSIS — I6302 Cerebral infarction due to thrombosis of basilar artery: Secondary | ICD-10-CM | POA: Diagnosis not present

## 2017-03-11 DIAGNOSIS — Z992 Dependence on renal dialysis: Secondary | ICD-10-CM

## 2017-03-11 NOTE — Patient Instructions (Signed)
-   continue ASA and crestor for stroke prevention - follow up with endocrinologist for better DM control and insulin pump - Follow up with your primary care physician for stroke risk factor modification. Recommend maintain blood pressure goal <130/80, diabetes with hemoglobin A1c goal below 6.5% and lipids with LDL cholesterol goal below 70 mg/dL.  - check glucose at home  - check BP at home everyday and record and bring over to PCP next visit to adjust BP meds - diabetic diet and regular exercise - follow up as needed.

## 2017-03-11 NOTE — Progress Notes (Signed)
STROKE NEUROLOGY FOLLOW UP NOTE  NAME: [name] DOB: Mar 16, 1976  REASON FOR VISIT: stroke follow up HISTORY FROM: chart and pt  Today we had the pleasure of seeing [name] in follow-up at our Neurology Clinic. Pt was accompanied by no one.   History Summary 41 year old AAF with multiple medical problems including uncontrolled HTN, DM, HLD and ESRD on HD was admitted on 03/29/14 for acute onset dizziness, vertigo, and right facial droop with drooling. She was found to have high BP in ER around 200s/100s. Her vertigo lasted about 10-65min but right facial droop lasted about 4-5 days. She declined PRISMS trial in ER. MRI did not show acute stroke. Her A1C was 10.3 and LDL 100. She was discharged with ASA 325mg  and crestor.   05/22/14 follow up - the patient has been doing well. No recurrent stroke symptoms. Her right facial droop resolved and denies any weakness, nubmness, HA or dizziness. Her sugar level is not in good control still, low as 49 today at home and high as 200s. She is on lantus and novolog at home. She usually not eating breakfast but eat more in lunch and dinner. Her BP was 132/75 today in clinic. She is still using Depo-provera for contraception and about to get another injection in the middle of this month.  08/25/16 follow up - During the interval time, pt has lost follow up. Today pt came in for new symptoms of left finger pain. She continued to have dialysis since last visit, and had followed with [facility] for vascular access. She had right arm graft revision on 08/01/16. After that procedure, she started to have left first 3 digit numbness and pain. The thenar area also hurting. No problem with last 2 digits. She stated that the numbness and pain getting worse over time, especially at night. Extending fingers seems to help but during sleep she can not keep the finger extended. The middle finger numbness and pain now resolved but the first 2 fingers still numb and hurt.  She went back to see Dr. Scot Dock and had steal study which was normal and she had palpable radial pulse. Dr. Scot Dock thinks it could potentially be some compression on adjacent nerves from retractors which may gradually improve with time. Her PCP referred her back to neurology.   During the encounter, pt has frequent dry heaves, vomiting, presumed to be DM related gastroparesis. She is on home reglan and pepcid. She follows with endocrinologist and plan for insulin pump. However, she stated that her dry heaves and vomiting started after she took gabapentin recently prescribed to her. She is on gabapentin 300mg  Qhs, and she would like to stop taking it.   Interval History During the interval time, she has been doing well. She had EMG/NCS and showed no neuropathy of the LUE. Her gabapentin discontinued and was put on lidocaine cream. Her left hand numbness and pain gradually resolved. Now she is also off lidocaine cream. Her glucose is better controlled last A1c 7.9, improved from prior. Insulin pump still pending. She is on the kidney transplant list. Glucose at home ranging from 70-120. BP low today 100/63, on amlodipine and labetalol. She does not check BP at home. Encouraged to check BP at home and record and over to PCP for BP meds adjustment. She continues follow with Dr. Scot Dock at [facility]. She'll see her PCP is 03/27/2017.   REVIEW OF SYSTEMS: Full 14 system review of systems performed and notable only for those listed  below and in HPI above, all others are negative:  Constitutional:  Cardiovascular: N/A  Ear/Nose/Throat: N/A  Skin: N/A  Eyes: N/A  Respiratory:   Gastroitestinal: Diarrhea  Genitourinary: N/A Hematology/Lymphatic:   Endocrine:   Musculoskeletal: N/A  Allergy/Immunology:   Neurological: Psychiatric:   The following represents the patient's updated allergies and side effects list: Allergies  Allergen Reactions  . Pollen Extract Other (See Comments)    Watery eyes     Labs since last visit of relevance include the following: Results for orders placed or performed during the hospital encounter of 08/01/16  hCG, serum, qualitative  Result Value Ref Range   Preg, Serum NEGATIVE NEGATIVE  Glucose, capillary  Result Value Ref Range   Glucose-Capillary 231 (H) 65 - 99 mg/dL  Glucose, capillary  Result Value Ref Range   Glucose-Capillary 233 (H) 65 - 99 mg/dL   Comment 1 Notify RN   I-STAT 4, (NA,K, GLUC, HGB,HCT)  Result Value Ref Range   Sodium 137 135 - 145 mmol/L   Potassium 4.1 3.5 - 5.1 mmol/L   Glucose, Bld 212 (H) 65 - 99 mg/dL   HCT 42.0 36.0 - 46.0 %   Hemoglobin 14.3 12.0 - 15.0 g/dL    The neurologically relevant items on the patient's problem list were reviewed on today's visit.  Neurologic Examination  A problem focused neurological exam (12 or more points of the single system neurologic examination, vital signs counts as 1 point, cranial nerves count for 8 points) was performed.  Blood pressure 100/63, pulse 83, height 5\' 5"  (1.651 m), weight 120 lb 1.6 oz (54.5 kg).  General - thin and fragile, well developed, not in acute distress  Ophthalmologic - Sharp disc margins OU.  Cardiovascular - Regular rate and rhythm with no murmur.  Mental Status -  Level of arousal and orientation to time, place, and person were intact. Language including expression, naming, repetition, comprehension, reading, and writing was assessed and found intact.  Cranial Nerves II - XII - II - Visual field intact OU. III, IV, VI - Extraocular movements intact. V - Facial sensation intact bilaterally. VII - Facial movement intact bilaterally. VIII - Hearing & vestibular intact bilaterally. X - Palate elevates symmetrically. XI - Chin turning & shoulder shrug intact bilaterally. XII - Tongue protrusion intact.  Motor Strength - The patient's strength was normal in all extremities and pronator drift was absent.  Bulk was normal and fasciculations  were absent.   Motor Tone - Muscle tone was assessed at the neck and appendages and was normal.  Reflexes - The patient's reflexes were normal in all extremities and she had no pathological reflexes.  Sensory - Light touch, temperature/pinprick were assessed and were symmetrical     Coordination - The patient had normal movements in the hands and feet with no ataxia or dysmetria.  Tremor was absent.  Gait and Station - The patient's transfers, posture, gait, station, and turns were observed as normal.  Data reviewed: I personally reviewed the images and agree with the radiology interpretations.  MRI and MRA of the brain  1. No acute intracranial abnormality. 2. Moderate age advanced atrophy. 3. Normal variant MRA circle of Willis without evidence for significant proximal stenosis, aneurysm, or branch vessel occlusion.  Carotid Doppler Bilateral: 1-39% ICA stenosis. Vertebral artery flow is antegrade.  2D Echocardiogram  - Left ventricle: E/e&'>14.4 suggestive of elevated LV filling pressures. The cavity size was normal. Systolic function was normal. The estimated ejection fraction was in the  range of 55% to 60%. Wall motion was normal; there were no regional wall motion abnormalities. - Aortic valve: Moderate thickening and calcification, consistent with sclerosis. - Mitral valve: There was trivial regurgitation. - Tricuspid valve: There was trivial regurgitation. - Pulmonic valve: There was trivial regurgitation. - Pericardium, extracardiac: A trivial pericardial effusion was identified posterior to the heart.  [place] steal exam - no significant increase of pressure with compression of fistula.   EMG/NCS 09/03/16: Nerve conduction studies of the left upper extremity were unremarkable, without evidence of a neuropathy seen. EMG evaluation of the left arm is unremarkable without evidence of an overlying cervical radiculopathy.   Component     Latest Ref Rng & Units  09/12/2014  Hemoglobin A1C     4.8 - 5.6 % 10.5 (H)  Mean Plasma Glucose     mg/dL 255  TSH     0.350 - 4.500 uIU/mL 0.936    Assessment: As you may recall, she is a 41 y.o. African American female with PMH of uncontrolled HTN and DM type I, HLD, ESRD on HD was admitted in 03/2014 for acute onset vertigo and right facial droop and drooling. MRI negative for stroke, MRA unremarkable. CUS and echo were WNL. However, she was found to have A1C 10.3 and LDL 100 as well as high BP. Her symptoms were considered as small vessel event not visualized in MRI due to multiple stroke risk factors. Her BP was low today in clinic but he did not check BP at home. Encourage check BP at home and bring over to PCP for BP meds adjustment. DM control improved, last A1c 7.9, following with endocrinology and plan for insulin pump.  She is on ASA 325 and crestor. She had been using depo-provera for contraception. She is on kidney transplant list.  She complain of left thumb and index finger pain numbness after left arm fistular revision on 08/01/16. [place] steal study was normal and she was put on gabapentin. However, she can not tolerate gabapentin, which was discontinued. Put on lidocaine topical cream for symptom relief. Had EMG/NCS showed no neuropathy or radiculopathy at LUE. Left finger pain most likely due to vascular origin instead of neuropathy. Left hand numbness and pain gradually resolved.  Plan:  - continue ASA and crestor for stroke prevention - follow up with endocrinologist for better DM control and insulin pump - Follow up with your primary care physician for stroke risk factor modification. Recommend maintain blood pressure goal <130/80, diabetes with hemoglobin A1c goal below 6.5% and lipids with LDL cholesterol goal below 70 mg/dL.  - check glucose at home  - check BP at home everyday and record and bring over to PCP next visit to adjust BP meds - diabetic diet and regular exercise - follow up as  needed.   No orders of the defined types were placed in this encounter.   No orders of the defined types were placed in this encounter.   Patient Instructions  - continue ASA and crestor for stroke prevention - follow up with endocrinologist for better DM control and insulin pump - Follow up with your primary care physician for stroke risk factor modification. Recommend maintain blood pressure goal <130/80, diabetes with hemoglobin A1c goal below 6.5% and lipids with LDL cholesterol goal below 70 mg/dL.  - check glucose at home  - check BP at home everyday and record and bring over to PCP next visit to adjust BP meds - diabetic diet and regular exercise - follow up  as needed.   Rosalin Hawking, MD PhD [facility] [address] [place],  [address] [phone]

## 2017-03-12 DIAGNOSIS — D631 Anemia in chronic kidney disease: Secondary | ICD-10-CM | POA: Diagnosis not present

## 2017-03-12 DIAGNOSIS — N2581 Secondary hyperparathyroidism of renal origin: Secondary | ICD-10-CM | POA: Diagnosis not present

## 2017-03-12 DIAGNOSIS — R739 Hyperglycemia, unspecified: Secondary | ICD-10-CM | POA: Diagnosis not present

## 2017-03-12 DIAGNOSIS — E1029 Type 1 diabetes mellitus with other diabetic kidney complication: Secondary | ICD-10-CM | POA: Diagnosis not present

## 2017-03-12 DIAGNOSIS — N186 End stage renal disease: Secondary | ICD-10-CM | POA: Diagnosis not present

## 2017-03-14 DIAGNOSIS — E1029 Type 1 diabetes mellitus with other diabetic kidney complication: Secondary | ICD-10-CM | POA: Diagnosis not present

## 2017-03-14 DIAGNOSIS — N2581 Secondary hyperparathyroidism of renal origin: Secondary | ICD-10-CM | POA: Diagnosis not present

## 2017-03-14 DIAGNOSIS — D631 Anemia in chronic kidney disease: Secondary | ICD-10-CM | POA: Diagnosis not present

## 2017-03-14 DIAGNOSIS — R739 Hyperglycemia, unspecified: Secondary | ICD-10-CM | POA: Diagnosis not present

## 2017-03-14 DIAGNOSIS — N186 End stage renal disease: Secondary | ICD-10-CM | POA: Diagnosis not present

## 2017-03-17 DIAGNOSIS — R739 Hyperglycemia, unspecified: Secondary | ICD-10-CM | POA: Diagnosis not present

## 2017-03-17 DIAGNOSIS — N2581 Secondary hyperparathyroidism of renal origin: Secondary | ICD-10-CM | POA: Diagnosis not present

## 2017-03-17 DIAGNOSIS — E1029 Type 1 diabetes mellitus with other diabetic kidney complication: Secondary | ICD-10-CM | POA: Diagnosis not present

## 2017-03-17 DIAGNOSIS — D631 Anemia in chronic kidney disease: Secondary | ICD-10-CM | POA: Diagnosis not present

## 2017-03-17 DIAGNOSIS — N186 End stage renal disease: Secondary | ICD-10-CM | POA: Diagnosis not present

## 2017-03-19 DIAGNOSIS — R739 Hyperglycemia, unspecified: Secondary | ICD-10-CM | POA: Diagnosis not present

## 2017-03-19 DIAGNOSIS — E1029 Type 1 diabetes mellitus with other diabetic kidney complication: Secondary | ICD-10-CM | POA: Diagnosis not present

## 2017-03-19 DIAGNOSIS — N2581 Secondary hyperparathyroidism of renal origin: Secondary | ICD-10-CM | POA: Diagnosis not present

## 2017-03-19 DIAGNOSIS — D631 Anemia in chronic kidney disease: Secondary | ICD-10-CM | POA: Diagnosis not present

## 2017-03-19 DIAGNOSIS — N186 End stage renal disease: Secondary | ICD-10-CM | POA: Diagnosis not present

## 2017-03-20 DIAGNOSIS — Z992 Dependence on renal dialysis: Secondary | ICD-10-CM | POA: Diagnosis not present

## 2017-03-20 DIAGNOSIS — N186 End stage renal disease: Secondary | ICD-10-CM | POA: Diagnosis not present

## 2017-03-20 DIAGNOSIS — E1029 Type 1 diabetes mellitus with other diabetic kidney complication: Secondary | ICD-10-CM | POA: Diagnosis not present

## 2017-03-21 DIAGNOSIS — R739 Hyperglycemia, unspecified: Secondary | ICD-10-CM | POA: Diagnosis not present

## 2017-03-21 DIAGNOSIS — Z23 Encounter for immunization: Secondary | ICD-10-CM | POA: Diagnosis not present

## 2017-03-21 DIAGNOSIS — E1029 Type 1 diabetes mellitus with other diabetic kidney complication: Secondary | ICD-10-CM | POA: Diagnosis not present

## 2017-03-21 DIAGNOSIS — N186 End stage renal disease: Secondary | ICD-10-CM | POA: Diagnosis not present

## 2017-03-21 DIAGNOSIS — N2581 Secondary hyperparathyroidism of renal origin: Secondary | ICD-10-CM | POA: Diagnosis not present

## 2017-03-21 DIAGNOSIS — D631 Anemia in chronic kidney disease: Secondary | ICD-10-CM | POA: Diagnosis not present

## 2017-03-24 DIAGNOSIS — R739 Hyperglycemia, unspecified: Secondary | ICD-10-CM | POA: Diagnosis not present

## 2017-03-24 DIAGNOSIS — N186 End stage renal disease: Secondary | ICD-10-CM | POA: Diagnosis not present

## 2017-03-24 DIAGNOSIS — N2581 Secondary hyperparathyroidism of renal origin: Secondary | ICD-10-CM | POA: Diagnosis not present

## 2017-03-24 DIAGNOSIS — E1029 Type 1 diabetes mellitus with other diabetic kidney complication: Secondary | ICD-10-CM | POA: Diagnosis not present

## 2017-03-24 DIAGNOSIS — Z23 Encounter for immunization: Secondary | ICD-10-CM | POA: Diagnosis not present

## 2017-03-24 DIAGNOSIS — D631 Anemia in chronic kidney disease: Secondary | ICD-10-CM | POA: Diagnosis not present

## 2017-03-26 DIAGNOSIS — D631 Anemia in chronic kidney disease: Secondary | ICD-10-CM | POA: Diagnosis not present

## 2017-03-26 DIAGNOSIS — N2581 Secondary hyperparathyroidism of renal origin: Secondary | ICD-10-CM | POA: Diagnosis not present

## 2017-03-26 DIAGNOSIS — R739 Hyperglycemia, unspecified: Secondary | ICD-10-CM | POA: Diagnosis not present

## 2017-03-26 DIAGNOSIS — Z23 Encounter for immunization: Secondary | ICD-10-CM | POA: Diagnosis not present

## 2017-03-26 DIAGNOSIS — N186 End stage renal disease: Secondary | ICD-10-CM | POA: Diagnosis not present

## 2017-03-26 DIAGNOSIS — E1029 Type 1 diabetes mellitus with other diabetic kidney complication: Secondary | ICD-10-CM | POA: Diagnosis not present

## 2017-03-28 DIAGNOSIS — N2581 Secondary hyperparathyroidism of renal origin: Secondary | ICD-10-CM | POA: Diagnosis not present

## 2017-03-28 DIAGNOSIS — R739 Hyperglycemia, unspecified: Secondary | ICD-10-CM | POA: Diagnosis not present

## 2017-03-28 DIAGNOSIS — D631 Anemia in chronic kidney disease: Secondary | ICD-10-CM | POA: Diagnosis not present

## 2017-03-28 DIAGNOSIS — N186 End stage renal disease: Secondary | ICD-10-CM | POA: Diagnosis not present

## 2017-03-28 DIAGNOSIS — E1029 Type 1 diabetes mellitus with other diabetic kidney complication: Secondary | ICD-10-CM | POA: Diagnosis not present

## 2017-03-28 DIAGNOSIS — Z23 Encounter for immunization: Secondary | ICD-10-CM | POA: Diagnosis not present

## 2017-03-31 DIAGNOSIS — Z23 Encounter for immunization: Secondary | ICD-10-CM | POA: Diagnosis not present

## 2017-03-31 DIAGNOSIS — E1029 Type 1 diabetes mellitus with other diabetic kidney complication: Secondary | ICD-10-CM | POA: Diagnosis not present

## 2017-03-31 DIAGNOSIS — D631 Anemia in chronic kidney disease: Secondary | ICD-10-CM | POA: Diagnosis not present

## 2017-03-31 DIAGNOSIS — N2581 Secondary hyperparathyroidism of renal origin: Secondary | ICD-10-CM | POA: Diagnosis not present

## 2017-03-31 DIAGNOSIS — R739 Hyperglycemia, unspecified: Secondary | ICD-10-CM | POA: Diagnosis not present

## 2017-03-31 DIAGNOSIS — N186 End stage renal disease: Secondary | ICD-10-CM | POA: Diagnosis not present

## 2017-04-02 DIAGNOSIS — N2581 Secondary hyperparathyroidism of renal origin: Secondary | ICD-10-CM | POA: Diagnosis not present

## 2017-04-02 DIAGNOSIS — N186 End stage renal disease: Secondary | ICD-10-CM | POA: Diagnosis not present

## 2017-04-02 DIAGNOSIS — E1029 Type 1 diabetes mellitus with other diabetic kidney complication: Secondary | ICD-10-CM | POA: Diagnosis not present

## 2017-04-02 DIAGNOSIS — R739 Hyperglycemia, unspecified: Secondary | ICD-10-CM | POA: Diagnosis not present

## 2017-04-02 DIAGNOSIS — Z23 Encounter for immunization: Secondary | ICD-10-CM | POA: Diagnosis not present

## 2017-04-02 DIAGNOSIS — D631 Anemia in chronic kidney disease: Secondary | ICD-10-CM | POA: Diagnosis not present

## 2017-04-04 DIAGNOSIS — E1029 Type 1 diabetes mellitus with other diabetic kidney complication: Secondary | ICD-10-CM | POA: Diagnosis not present

## 2017-04-04 DIAGNOSIS — N2581 Secondary hyperparathyroidism of renal origin: Secondary | ICD-10-CM | POA: Diagnosis not present

## 2017-04-04 DIAGNOSIS — D631 Anemia in chronic kidney disease: Secondary | ICD-10-CM | POA: Diagnosis not present

## 2017-04-04 DIAGNOSIS — N186 End stage renal disease: Secondary | ICD-10-CM | POA: Diagnosis not present

## 2017-04-04 DIAGNOSIS — R739 Hyperglycemia, unspecified: Secondary | ICD-10-CM | POA: Diagnosis not present

## 2017-04-04 DIAGNOSIS — Z23 Encounter for immunization: Secondary | ICD-10-CM | POA: Diagnosis not present

## 2017-04-07 DIAGNOSIS — Z23 Encounter for immunization: Secondary | ICD-10-CM | POA: Diagnosis not present

## 2017-04-07 DIAGNOSIS — N2581 Secondary hyperparathyroidism of renal origin: Secondary | ICD-10-CM | POA: Diagnosis not present

## 2017-04-07 DIAGNOSIS — N186 End stage renal disease: Secondary | ICD-10-CM | POA: Diagnosis not present

## 2017-04-07 DIAGNOSIS — D631 Anemia in chronic kidney disease: Secondary | ICD-10-CM | POA: Diagnosis not present

## 2017-04-07 DIAGNOSIS — E1029 Type 1 diabetes mellitus with other diabetic kidney complication: Secondary | ICD-10-CM | POA: Diagnosis not present

## 2017-04-07 DIAGNOSIS — R739 Hyperglycemia, unspecified: Secondary | ICD-10-CM | POA: Diagnosis not present

## 2017-04-09 DIAGNOSIS — N2581 Secondary hyperparathyroidism of renal origin: Secondary | ICD-10-CM | POA: Diagnosis not present

## 2017-04-09 DIAGNOSIS — E1029 Type 1 diabetes mellitus with other diabetic kidney complication: Secondary | ICD-10-CM | POA: Diagnosis not present

## 2017-04-09 DIAGNOSIS — Z23 Encounter for immunization: Secondary | ICD-10-CM | POA: Diagnosis not present

## 2017-04-09 DIAGNOSIS — D631 Anemia in chronic kidney disease: Secondary | ICD-10-CM | POA: Diagnosis not present

## 2017-04-09 DIAGNOSIS — N186 End stage renal disease: Secondary | ICD-10-CM | POA: Diagnosis not present

## 2017-04-09 DIAGNOSIS — R739 Hyperglycemia, unspecified: Secondary | ICD-10-CM | POA: Diagnosis not present

## 2017-04-11 DIAGNOSIS — Z23 Encounter for immunization: Secondary | ICD-10-CM | POA: Diagnosis not present

## 2017-04-11 DIAGNOSIS — N2581 Secondary hyperparathyroidism of renal origin: Secondary | ICD-10-CM | POA: Diagnosis not present

## 2017-04-11 DIAGNOSIS — R739 Hyperglycemia, unspecified: Secondary | ICD-10-CM | POA: Diagnosis not present

## 2017-04-11 DIAGNOSIS — D631 Anemia in chronic kidney disease: Secondary | ICD-10-CM | POA: Diagnosis not present

## 2017-04-11 DIAGNOSIS — N186 End stage renal disease: Secondary | ICD-10-CM | POA: Diagnosis not present

## 2017-04-11 DIAGNOSIS — E1029 Type 1 diabetes mellitus with other diabetic kidney complication: Secondary | ICD-10-CM | POA: Diagnosis not present

## 2017-04-14 DIAGNOSIS — Z23 Encounter for immunization: Secondary | ICD-10-CM | POA: Diagnosis not present

## 2017-04-14 DIAGNOSIS — N186 End stage renal disease: Secondary | ICD-10-CM | POA: Diagnosis not present

## 2017-04-14 DIAGNOSIS — D631 Anemia in chronic kidney disease: Secondary | ICD-10-CM | POA: Diagnosis not present

## 2017-04-14 DIAGNOSIS — N2581 Secondary hyperparathyroidism of renal origin: Secondary | ICD-10-CM | POA: Diagnosis not present

## 2017-04-14 DIAGNOSIS — R739 Hyperglycemia, unspecified: Secondary | ICD-10-CM | POA: Diagnosis not present

## 2017-04-14 DIAGNOSIS — E1029 Type 1 diabetes mellitus with other diabetic kidney complication: Secondary | ICD-10-CM | POA: Diagnosis not present

## 2017-04-16 DIAGNOSIS — Z23 Encounter for immunization: Secondary | ICD-10-CM | POA: Diagnosis not present

## 2017-04-16 DIAGNOSIS — N2581 Secondary hyperparathyroidism of renal origin: Secondary | ICD-10-CM | POA: Diagnosis not present

## 2017-04-16 DIAGNOSIS — E1029 Type 1 diabetes mellitus with other diabetic kidney complication: Secondary | ICD-10-CM | POA: Diagnosis not present

## 2017-04-16 DIAGNOSIS — D631 Anemia in chronic kidney disease: Secondary | ICD-10-CM | POA: Diagnosis not present

## 2017-04-16 DIAGNOSIS — R739 Hyperglycemia, unspecified: Secondary | ICD-10-CM | POA: Diagnosis not present

## 2017-04-16 DIAGNOSIS — N186 End stage renal disease: Secondary | ICD-10-CM | POA: Diagnosis not present

## 2017-04-18 ENCOUNTER — Encounter (HOSPITAL_BASED_OUTPATIENT_CLINIC_OR_DEPARTMENT_OTHER): Payer: Self-pay | Admitting: Emergency Medicine

## 2017-04-18 ENCOUNTER — Emergency Department (HOSPITAL_BASED_OUTPATIENT_CLINIC_OR_DEPARTMENT_OTHER)
Admission: EM | Admit: 2017-04-18 | Discharge: 2017-04-18 | Disposition: A | Discharge status: Home / Self Care | Payer: Medicare Other | Attending: Emergency Medicine | Admitting: Emergency Medicine

## 2017-04-18 DIAGNOSIS — E162 Hypoglycemia, unspecified: Secondary | ICD-10-CM

## 2017-04-18 DIAGNOSIS — Z9641 Presence of insulin pump (external) (internal): Secondary | ICD-10-CM | POA: Diagnosis not present

## 2017-04-18 DIAGNOSIS — Z794 Long term (current) use of insulin: Secondary | ICD-10-CM | POA: Diagnosis not present

## 2017-04-18 DIAGNOSIS — I120 Hypertensive chronic kidney disease with stage 5 chronic kidney disease or end stage renal disease: Secondary | ICD-10-CM | POA: Insufficient documentation

## 2017-04-18 DIAGNOSIS — Z95828 Presence of other vascular implants and grafts: Secondary | ICD-10-CM | POA: Diagnosis not present

## 2017-04-18 DIAGNOSIS — R739 Hyperglycemia, unspecified: Secondary | ICD-10-CM | POA: Diagnosis not present

## 2017-04-18 DIAGNOSIS — Z8673 Personal history of transient ischemic attack (TIA), and cerebral infarction without residual deficits: Secondary | ICD-10-CM | POA: Insufficient documentation

## 2017-04-18 DIAGNOSIS — Z7982 Long term (current) use of aspirin: Secondary | ICD-10-CM | POA: Insufficient documentation

## 2017-04-18 DIAGNOSIS — E10649 Type 1 diabetes mellitus with hypoglycemia without coma: Secondary | ICD-10-CM | POA: Insufficient documentation

## 2017-04-18 DIAGNOSIS — Z992 Dependence on renal dialysis: Secondary | ICD-10-CM | POA: Diagnosis not present

## 2017-04-18 DIAGNOSIS — Z23 Encounter for immunization: Secondary | ICD-10-CM | POA: Diagnosis not present

## 2017-04-18 DIAGNOSIS — D631 Anemia in chronic kidney disease: Secondary | ICD-10-CM | POA: Diagnosis not present

## 2017-04-18 DIAGNOSIS — Z79899 Other long term (current) drug therapy: Secondary | ICD-10-CM | POA: Diagnosis not present

## 2017-04-18 DIAGNOSIS — N2581 Secondary hyperparathyroidism of renal origin: Secondary | ICD-10-CM | POA: Diagnosis not present

## 2017-04-18 DIAGNOSIS — E1029 Type 1 diabetes mellitus with other diabetic kidney complication: Secondary | ICD-10-CM | POA: Diagnosis not present

## 2017-04-18 DIAGNOSIS — N186 End stage renal disease: Secondary | ICD-10-CM | POA: Diagnosis not present

## 2017-04-18 LAB — CBG MONITORING, ED
GLUCOSE-CAPILLARY: 53 mg/dL — AB (ref 65–99)
GLUCOSE-CAPILLARY: 105 mg/dL — AB (ref 65–99)
GLUCOSE-CAPILLARY: 61 mg/dL — AB (ref 65–99)
Glucose-Capillary: 44 mg/dL — CL (ref 65–99)

## 2017-04-18 NOTE — ED Triage Notes (Addendum)
Pt is a diabetic, wears an insulin pump. Says her sugar is 42. She has turned it off and is drinking juice. Pt also had dialysis today.

## 2017-04-18 NOTE — ED Notes (Addendum)
ED Provider at bedside, aware of glucose 44

## 2017-04-18 NOTE — ED Notes (Signed)
On going in to assess pt after arrival to treatment room, the patient is eating a glucose tab and drinking apple juice. Reports she had a low blood sugar at dialysis this morning was given some dextrose and this afternoon had hypoglycemia again. She has turned off her insulin pump at this time.

## 2017-04-18 NOTE — ED Notes (Signed)
Pt called out stating her glucose was dropping, her monitor is reading 44. Recheck with our meter was 61, encouraging pt to continue to eat snack as given.

## 2017-04-18 NOTE — ED Provider Notes (Signed)
[facility] DEPT [facility] Provider Note   CSN: [id number] Arrival date & time: 04/18/17  1627     History   Chief Complaint Chief Complaint  Patient presents with  . Hypoglycemia    HPI [name] is a 41 y.o. female.  HPI  Patient presents with concern of hypoglycemia. Patient is a insulin-dependent diabetic, has an insulin pump that was initiated 3 days ago. She notes that since the implantation she has had no success with device until the past day when it is now functioning. However, today she has had multiple low glucose readings. She denies feeling differently, feeling lightheaded, syncope, pain, nausea, vomiting. She completed a full dialysis session this afternoon.        Past Medical History:  Diagnosis Date  . Anemia   . Arthritis   . Diabetes mellitus   . Diabetic retinopathy ([place])    Hx of laser Rx's  . DM type 1 (diabetes mellitus, type 1) ([place]) 09/12/2014   Age of onset for DM type 1 was age 34.     Marland Kitchen ESRD on hemodialysis ([place])    ESRD due to DM type I, age of onset DM 1 was age 43.  Went on dialysis in March 2012.  Gets HD now at [facility] on a TTS schedule.    Marland Kitchen ESRD on hemodialysis ([place]) 09/12/2014   ESRD due to DM type 1.  Started HD in 2012 at [facility].  Gets HD there on a TTS schedule.    Marland Kitchen GERD (gastroesophageal reflux disease)   . History of blood transfusion   . Hyperlipidemia   . Hypertension   . Peripheral vascular disease ([place])   . Pneumonia   . Stroke [place])     Patient Active Problem List   Diagnosis Date Noted  . Cerebral infarction due to thrombosis of basilar artery ([place]) 03/11/2017  . Left hand pain 08/25/2016  . Hyperparathyroidism, secondary renal ([place])   . Benign essential HTN   . Pneumonia 09/12/2014  . CAP (community acquired pneumonia) 09/12/2014  . ESRD on hemodialysis ([place]) 09/12/2014  . Diabetes mellitus type I ([place]) 09/12/2014  . History of diabetic retinopathy 09/12/2014  . HCAP (healthcare-associated  pneumonia) 09/11/2014  . Cerebral infarction due to unspecified mechanism 05/22/2014  . Hypertensive emergency 05/22/2014  . Essential hypertension, benign 05/22/2014  . DM (diabetes mellitus) ([place]) 05/22/2014  . HLD (hyperlipidemia) 05/22/2014  . Cerebral infarction ([place]) 03/29/2014  . Facial droop 03/29/2014  . Mechanical complication of other vascular device, implant, and graft 01/12/2013  . Other complications due to renal dialysis device, implant, and graft 12/08/2012  . ESRD on hemodialysis ([place]) 04/18/2011    Past Surgical History:  Procedure Laterality Date  . ARTERIOVENOUS GRAFT PLACEMENT    . ARTERY REPAIR Left 12/10/2012   Procedure: BRACHIAL ARTERY REPAIR;  Surgeon: Angelia Mould, MD;  Location: [facility] OR;  Service: Vascular;  Laterality: Left;  Exploration Left Brachial Artery for AVF  . CESAREAN SECTION    . EYE SURGERY     [place]  . REVISION OF ARTERIOVENOUS GORETEX GRAFT Left 08/01/2016   Procedure: REVISION OF ARTERIOVENOUS GORETEX GRAFT;  Surgeon: Angelia Mould, MD;  Location: [facility];  Service: Vascular;  Laterality: Left;  . SHUNTOGRAM Left 11/30/2013   Procedure: SHUNTOGRAM;  Surgeon: Serafina Mitchell, MD;  Location[facility] C[facility] LAB;  Service: Cardiovascular;  Laterality: Left;    OB History    Gravida Para Term Preterm AB Living   0 0 0  0 0     SAB TAB Ectopic Multiple Live Births   0 0 0           Home Medications    Prior to Admission medications   Medication Sig Start Date End Date Taking? Authorizing Provider  amLODipine (NORVASC) 10 MG tablet Take 10 mg by mouth daily.     [provider]  aspirin 81 MG EC tablet Take 81 mg by mouth daily. Swallow whole.    [provider]  calcium acetate (PHOSLO) 667 MG capsule Take 667 mg by mouth 3 (three) times daily with meals.      [provider]  cinacalcet (SENSIPAR) 30 MG tablet Take 30 mg by mouth daily.     [provider]  famotidine (PEPCID AC) 10 MG chewable  tablet Chew 10 mg by mouth daily as needed for heartburn.    [provider]  GLUCAGON EMERGENCY 1 MG injection Inject 1 mg into the muscle once as needed (for severe low blood sugar).  11/22/12   [provider]  insulin aspart (NOVOLOG) 100 UNIT/ML FlexPen Inject 3-10 Units into the skin See admin instructions. Based on sliding scale 1 unit per 20g of carbs 06/16/16   [provider]  insulin glargine (LANTUS) 100 UNIT/ML injection Inject 10 Units into the skin at bedtime.     [provider]  labetalol (NORMODYNE) 300 MG tablet Take 300 mg by mouth 3 (three) times daily.    [provider]  Pediatric Multiple Vit-C-FA (FLINSTONES GUMMIES OMEGA-3 DHA) CHEW Chew 1 tablet by mouth daily.     [provider]  rosuvastatin (CRESTOR) 10 MG tablet Take 10 mg by mouth every evening.     [provider]    Family History Family History  Problem Relation Age of Onset  . Cancer Mother        breast  . Hypertension Father     Social History Social History  Substance Use Topics  . Smoking status: Never Smoker  . Smokeless tobacco: Never Used  . Alcohol use No     Allergies   Pollen extract   Review of Systems Review of Systems  Constitutional:       Per HPI, otherwise negative  HENT:       Per HPI, otherwise negative  Respiratory:       Per HPI, otherwise negative  Cardiovascular:       Per HPI, otherwise negative  Gastrointestinal: Negative for vomiting.  Endocrine:       Negative aside from HPI  Genitourinary:       Neg aside from HPI   Musculoskeletal:       Per HPI, otherwise negative  Skin: Negative.   Neurological: Negative for syncope.     Physical Exam Updated Vital Signs BP (!) 180/102 (BP Location: Right Arm)   Pulse 84   Temp 98.9 F (37.2 C) (Oral)   Resp 18   Ht 5\' 3"  (1.6 m)   Wt 53.6 kg (118 lb 2.7 oz)   SpO2 98%   BMI 20.93 kg/m   Physical Exam  Constitutional: She is oriented to  person, place, and time. She appears well-developed and well-nourished. No distress.  HENT:  Head: Normocephalic and atraumatic.  Eyes: Conjunctivae and EOM are normal.  Pulmonary/Chest: Effort normal. No stridor. No respiratory distress.  Abdominal: She exhibits no distension.  Insulin pump in place, right-sided, unremarkable  Musculoskeletal: She exhibits no edema.  Neurological: She is alert  and oriented to person, place, and time. No cranial nerve deficit.  Skin: Skin is warm and dry.  Psychiatric: She has a normal mood and affect.  Nursing note and vitals reviewed.    ED Treatments / Results  Labs (all labs ordered are listed, but only abnormal results are displayed) Labs Reviewed  CBG MONITORING, ED - Abnormal; Notable for the following:       Result Value   Glucose-Capillary 53 (*)    All other components within normal limits  CBG MONITORING, ED - Abnormal; Notable for the following:    Glucose-Capillary 44 (*)    All other components within normal limits     Procedures Procedures (including critical care time)    Initial Impression / Assessment and Plan / ED Course  I have reviewed the triage vital signs and the nursing notes.  Pertinent labs & imaging results that were available during my care of the patient were reviewed by me and considered in my medical decision making (see chart for details).  After initial point-of-care glucose testing here was notable for low value, the patient stopped her insulin pump. Subsequent, after provision of sugar, juice, the patient continued to have hyperglycemia.  Patient was monitored for several hours, said, provided dextrose, and with no substantial hypoglycemia, and after turning off the glucose pump, the patient was discharged in stable condition with instructions to follow-up with primary care in 2 days for reevaluation of the pump itself. Patient will continue to use recently used insulin for glucose control. Absent other  complaints, there suspicion for pump malfunction, contributing to her symptoms.   Final Clinical Impressions(s) / ED Diagnoses  Hypoglycemia   Carmin Muskrat, MD 04/18/17 1758

## 2017-04-18 NOTE — Discharge Instructions (Signed)
As discussed, your evaluation today has been largely reassuring.  But, it is important that you monitor your condition carefully, and do not hesitate to return to the ED if you develop new, or concerning changes in your condition.  Otherwise, please follow-up with your physician for appropriate ongoing care.  Until you have seen your physician to not use your insulin pump.

## 2017-04-19 DIAGNOSIS — E1029 Type 1 diabetes mellitus with other diabetic kidney complication: Secondary | ICD-10-CM | POA: Diagnosis not present

## 2017-04-19 DIAGNOSIS — Z992 Dependence on renal dialysis: Secondary | ICD-10-CM | POA: Diagnosis not present

## 2017-04-19 DIAGNOSIS — N186 End stage renal disease: Secondary | ICD-10-CM | POA: Diagnosis not present

## 2017-04-21 DIAGNOSIS — N2581 Secondary hyperparathyroidism of renal origin: Secondary | ICD-10-CM | POA: Diagnosis not present

## 2017-04-21 DIAGNOSIS — E1029 Type 1 diabetes mellitus with other diabetic kidney complication: Secondary | ICD-10-CM | POA: Diagnosis not present

## 2017-04-21 DIAGNOSIS — D631 Anemia in chronic kidney disease: Secondary | ICD-10-CM | POA: Diagnosis not present

## 2017-04-21 DIAGNOSIS — R739 Hyperglycemia, unspecified: Secondary | ICD-10-CM | POA: Diagnosis not present

## 2017-04-21 DIAGNOSIS — N186 End stage renal disease: Secondary | ICD-10-CM | POA: Diagnosis not present

## 2017-04-23 DIAGNOSIS — N186 End stage renal disease: Secondary | ICD-10-CM | POA: Diagnosis not present

## 2017-04-23 DIAGNOSIS — R739 Hyperglycemia, unspecified: Secondary | ICD-10-CM | POA: Diagnosis not present

## 2017-04-23 DIAGNOSIS — E1029 Type 1 diabetes mellitus with other diabetic kidney complication: Secondary | ICD-10-CM | POA: Diagnosis not present

## 2017-04-23 DIAGNOSIS — N2581 Secondary hyperparathyroidism of renal origin: Secondary | ICD-10-CM | POA: Diagnosis not present

## 2017-04-23 DIAGNOSIS — D631 Anemia in chronic kidney disease: Secondary | ICD-10-CM | POA: Diagnosis not present

## 2017-04-25 DIAGNOSIS — D631 Anemia in chronic kidney disease: Secondary | ICD-10-CM | POA: Diagnosis not present

## 2017-04-25 DIAGNOSIS — E1029 Type 1 diabetes mellitus with other diabetic kidney complication: Secondary | ICD-10-CM | POA: Diagnosis not present

## 2017-04-25 DIAGNOSIS — N186 End stage renal disease: Secondary | ICD-10-CM | POA: Diagnosis not present

## 2017-04-25 DIAGNOSIS — R739 Hyperglycemia, unspecified: Secondary | ICD-10-CM | POA: Diagnosis not present

## 2017-04-25 DIAGNOSIS — N2581 Secondary hyperparathyroidism of renal origin: Secondary | ICD-10-CM | POA: Diagnosis not present

## 2017-04-27 DIAGNOSIS — N186 End stage renal disease: Secondary | ICD-10-CM | POA: Diagnosis not present

## 2017-04-27 DIAGNOSIS — N2581 Secondary hyperparathyroidism of renal origin: Secondary | ICD-10-CM | POA: Diagnosis not present

## 2017-04-27 DIAGNOSIS — E1029 Type 1 diabetes mellitus with other diabetic kidney complication: Secondary | ICD-10-CM | POA: Diagnosis not present

## 2017-04-27 DIAGNOSIS — D631 Anemia in chronic kidney disease: Secondary | ICD-10-CM | POA: Diagnosis not present

## 2017-04-27 DIAGNOSIS — R739 Hyperglycemia, unspecified: Secondary | ICD-10-CM | POA: Diagnosis not present

## 2017-04-30 DIAGNOSIS — N186 End stage renal disease: Secondary | ICD-10-CM | POA: Diagnosis not present

## 2017-04-30 DIAGNOSIS — E1029 Type 1 diabetes mellitus with other diabetic kidney complication: Secondary | ICD-10-CM | POA: Diagnosis not present

## 2017-04-30 DIAGNOSIS — N2581 Secondary hyperparathyroidism of renal origin: Secondary | ICD-10-CM | POA: Diagnosis not present

## 2017-04-30 DIAGNOSIS — D631 Anemia in chronic kidney disease: Secondary | ICD-10-CM | POA: Diagnosis not present

## 2017-04-30 DIAGNOSIS — R739 Hyperglycemia, unspecified: Secondary | ICD-10-CM | POA: Diagnosis not present

## 2017-05-02 DIAGNOSIS — N186 End stage renal disease: Secondary | ICD-10-CM | POA: Diagnosis not present

## 2017-05-02 DIAGNOSIS — R739 Hyperglycemia, unspecified: Secondary | ICD-10-CM | POA: Diagnosis not present

## 2017-05-02 DIAGNOSIS — N2581 Secondary hyperparathyroidism of renal origin: Secondary | ICD-10-CM | POA: Diagnosis not present

## 2017-05-02 DIAGNOSIS — E1029 Type 1 diabetes mellitus with other diabetic kidney complication: Secondary | ICD-10-CM | POA: Diagnosis not present

## 2017-05-02 DIAGNOSIS — D631 Anemia in chronic kidney disease: Secondary | ICD-10-CM | POA: Diagnosis not present

## 2017-05-05 DIAGNOSIS — N2581 Secondary hyperparathyroidism of renal origin: Secondary | ICD-10-CM | POA: Diagnosis not present

## 2017-05-05 DIAGNOSIS — D631 Anemia in chronic kidney disease: Secondary | ICD-10-CM | POA: Diagnosis not present

## 2017-05-05 DIAGNOSIS — R739 Hyperglycemia, unspecified: Secondary | ICD-10-CM | POA: Diagnosis not present

## 2017-05-05 DIAGNOSIS — E1029 Type 1 diabetes mellitus with other diabetic kidney complication: Secondary | ICD-10-CM | POA: Diagnosis not present

## 2017-05-05 DIAGNOSIS — N186 End stage renal disease: Secondary | ICD-10-CM | POA: Diagnosis not present

## 2017-05-07 DIAGNOSIS — N186 End stage renal disease: Secondary | ICD-10-CM | POA: Diagnosis not present

## 2017-05-07 DIAGNOSIS — R739 Hyperglycemia, unspecified: Secondary | ICD-10-CM | POA: Diagnosis not present

## 2017-05-07 DIAGNOSIS — N2581 Secondary hyperparathyroidism of renal origin: Secondary | ICD-10-CM | POA: Diagnosis not present

## 2017-05-07 DIAGNOSIS — E1029 Type 1 diabetes mellitus with other diabetic kidney complication: Secondary | ICD-10-CM | POA: Diagnosis not present

## 2017-05-07 DIAGNOSIS — D631 Anemia in chronic kidney disease: Secondary | ICD-10-CM | POA: Diagnosis not present

## 2017-05-09 DIAGNOSIS — N2581 Secondary hyperparathyroidism of renal origin: Secondary | ICD-10-CM | POA: Diagnosis not present

## 2017-05-09 DIAGNOSIS — R739 Hyperglycemia, unspecified: Secondary | ICD-10-CM | POA: Diagnosis not present

## 2017-05-09 DIAGNOSIS — D631 Anemia in chronic kidney disease: Secondary | ICD-10-CM | POA: Diagnosis not present

## 2017-05-09 DIAGNOSIS — N186 End stage renal disease: Secondary | ICD-10-CM | POA: Diagnosis not present

## 2017-05-09 DIAGNOSIS — E1029 Type 1 diabetes mellitus with other diabetic kidney complication: Secondary | ICD-10-CM | POA: Diagnosis not present

## 2017-05-12 DIAGNOSIS — D631 Anemia in chronic kidney disease: Secondary | ICD-10-CM | POA: Diagnosis not present

## 2017-05-12 DIAGNOSIS — E1029 Type 1 diabetes mellitus with other diabetic kidney complication: Secondary | ICD-10-CM | POA: Diagnosis not present

## 2017-05-12 DIAGNOSIS — N2581 Secondary hyperparathyroidism of renal origin: Secondary | ICD-10-CM | POA: Diagnosis not present

## 2017-05-12 DIAGNOSIS — R739 Hyperglycemia, unspecified: Secondary | ICD-10-CM | POA: Diagnosis not present

## 2017-05-12 DIAGNOSIS — N186 End stage renal disease: Secondary | ICD-10-CM | POA: Diagnosis not present

## 2017-05-14 DIAGNOSIS — D631 Anemia in chronic kidney disease: Secondary | ICD-10-CM | POA: Diagnosis not present

## 2017-05-14 DIAGNOSIS — R739 Hyperglycemia, unspecified: Secondary | ICD-10-CM | POA: Diagnosis not present

## 2017-05-14 DIAGNOSIS — E1029 Type 1 diabetes mellitus with other diabetic kidney complication: Secondary | ICD-10-CM | POA: Diagnosis not present

## 2017-05-14 DIAGNOSIS — N2581 Secondary hyperparathyroidism of renal origin: Secondary | ICD-10-CM | POA: Diagnosis not present

## 2017-05-14 DIAGNOSIS — N186 End stage renal disease: Secondary | ICD-10-CM | POA: Diagnosis not present

## 2017-05-16 DIAGNOSIS — D631 Anemia in chronic kidney disease: Secondary | ICD-10-CM | POA: Diagnosis not present

## 2017-05-16 DIAGNOSIS — E1029 Type 1 diabetes mellitus with other diabetic kidney complication: Secondary | ICD-10-CM | POA: Diagnosis not present

## 2017-05-16 DIAGNOSIS — N186 End stage renal disease: Secondary | ICD-10-CM | POA: Diagnosis not present

## 2017-05-16 DIAGNOSIS — N2581 Secondary hyperparathyroidism of renal origin: Secondary | ICD-10-CM | POA: Diagnosis not present

## 2017-05-16 DIAGNOSIS — R739 Hyperglycemia, unspecified: Secondary | ICD-10-CM | POA: Diagnosis not present

## 2017-05-19 DIAGNOSIS — D631 Anemia in chronic kidney disease: Secondary | ICD-10-CM | POA: Diagnosis not present

## 2017-05-19 DIAGNOSIS — N186 End stage renal disease: Secondary | ICD-10-CM | POA: Diagnosis not present

## 2017-05-19 DIAGNOSIS — N2581 Secondary hyperparathyroidism of renal origin: Secondary | ICD-10-CM | POA: Diagnosis not present

## 2017-05-19 DIAGNOSIS — E1029 Type 1 diabetes mellitus with other diabetic kidney complication: Secondary | ICD-10-CM | POA: Diagnosis not present

## 2017-05-19 DIAGNOSIS — R739 Hyperglycemia, unspecified: Secondary | ICD-10-CM | POA: Diagnosis not present

## 2017-05-20 ENCOUNTER — Ambulatory Visit (INDEPENDENT_AMBULATORY_CARE_PROVIDER_SITE_OTHER): Payer: Medicare Other | Admitting: Vascular Surgery

## 2017-05-20 ENCOUNTER — Encounter: Payer: Self-pay | Admitting: Vascular Surgery

## 2017-05-20 VITALS — BP 160/88 | HR 83 | Temp 97.8°F | Resp 16 | Ht 64.0 in | Wt 120.0 lb

## 2017-05-20 DIAGNOSIS — N186 End stage renal disease: Secondary | ICD-10-CM | POA: Diagnosis not present

## 2017-05-20 DIAGNOSIS — I6302 Cerebral infarction due to thrombosis of basilar artery: Secondary | ICD-10-CM

## 2017-05-20 DIAGNOSIS — Z992 Dependence on renal dialysis: Secondary | ICD-10-CM | POA: Diagnosis not present

## 2017-05-20 DIAGNOSIS — E1029 Type 1 diabetes mellitus with other diabetic kidney complication: Secondary | ICD-10-CM | POA: Diagnosis not present

## 2017-05-20 NOTE — Progress Notes (Signed)
History of Present Illness:  Patient is a 41 y.o. year old female who presents for placement examination of ulcer over left UE av graft hemodialysis access. The patient is has used this graft for 10+ years.  Her last revision of the venous limb of the graft was dissected free up to where it was anastomosed to the axillary vein .  The patient is currently on hemodialysis T-TH-Sat.   Other chronic medical problems include DM insulin dependent, HTN and hyperlipidemia.  Past Medical History:  Diagnosis Date  . Anemia   . Arthritis   . Diabetes mellitus   . Diabetic retinopathy ([place])    Hx of laser Rx's  . DM type 1 (diabetes mellitus, type 1) ([place]) 09/12/2014   Age of onset for DM type 1 was age 50.     Marland Kitchen ESRD on hemodialysis ([place])    ESRD due to DM type I, age of onset DM 1 was age 54.  Went on dialysis in March 2012.  Gets HD now at [facility] on a TTS schedule.    Marland Kitchen ESRD on hemodialysis ([place]) 09/12/2014   ESRD due to DM type 1.  Started HD in 2012 at [facility].  Gets HD there on a TTS schedule.    Marland Kitchen GERD (gastroesophageal reflux disease)   . History of blood transfusion   . Hyperlipidemia   . Hypertension   . Peripheral vascular disease ([place])   . Pneumonia   . Stroke [facility])     Past Surgical History:  Procedure Laterality Date  . ARTERIOVENOUS GRAFT PLACEMENT    . ARTERY REPAIR Left 12/10/2012   Procedure: BRACHIAL ARTERY REPAIR;  Surgeon: Angelia Mould, MD;  [facility] OR;  Service: Vascular;  Laterality: Left;  Exploration Left Brachial Artery for AVF  . CESAREAN SECTION    . EYE SURGERY     [place]  . REVISION OF ARTERIOVENOUS GORETEX GRAFT Left 08/01/2016   Procedure: REVISION OF ARTERIOVENOUS GORETEX GRAFT;  Surgeon: Angelia Mould, MD;  Location: [place];  Service: Vascular;  Laterality: Left;  . SHUNTOGRAM Left 11/30/2013   Procedure: SHUNTOGRAM;  Surgeon: Serafina Mitchell, MD;  Location: [facility] CATH LAB;  Service: Cardiovascular;  Laterality: Left;      Social History Social History  Substance Use Topics  . Smoking status: Never Smoker  . Smokeless tobacco: Never Used  . Alcohol use No    Family History Family History  Problem Relation Age of Onset  . Cancer Mother        breast  . Hypertension Father     Allergies  Allergies  Allergen Reactions  . Pollen Extract Other (See Comments)    Watery eyes     Current Outpatient Prescriptions  Medication Sig Dispense Refill  . amLODipine (NORVASC) 10 MG tablet Take 10 mg by mouth daily.     Marland Kitchen aspirin 81 MG EC tablet Take 81 mg by mouth daily. Swallow whole.    . calcium acetate (PHOSLO) 667 MG capsule Take 667 mg by mouth 3 (three) times daily with meals.      . cinacalcet (SENSIPAR) 30 MG tablet Take 30 mg by mouth daily.     . famotidine (PEPCID AC) 10 MG chewable tablet Chew 10 mg by mouth daily as needed for heartburn.    Marland Kitchen GLUCAGON EMERGENCY 1 MG injection Inject 1 mg into the muscle once as needed (for severe low blood sugar).     . insulin aspart (NOVOLOG) 100  UNIT/ML injection novolog 100 units/ml via Insulin pump    . labetalol (NORMODYNE) 300 MG tablet Take 300 mg by mouth 3 (three) times daily.    . Pediatric Multiple Vit-C-FA (FLINSTONES GUMMIES OMEGA-3 DHA) CHEW Chew 1 tablet by mouth daily.     . rosuvastatin (CRESTOR) 10 MG tablet Take 10 mg by mouth every evening.     . insulin aspart (NOVOLOG) 100 UNIT/ML FlexPen Inject 3-10 Units into the skin See admin instructions. Based on sliding scale 1 unit per 20g of carbs    . insulin glargine (LANTUS) 100 UNIT/ML injection Inject 10 Units into the skin at bedtime.      No current facility-administered medications for this visit.     ROS:   General:  No weight loss, Fever, chills  HEENT: No recent headaches, no nasal bleeding, no visual changes, no sore throat  Neurologic: No dizziness, blackouts, seizures. No recent symptoms of stroke or mini- stroke. No recent episodes of slurred speech, or temporary  blindness.  Cardiac: No recent episodes of chest pain/pressure, no shortness of breath at rest.  No shortness of breath with exertion.  Denies history of atrial fibrillation or irregular heartbeat  Vascular: No history of rest pain in feet.  No history of claudication.  No history of non-healing ulcer, No history of DVT   Pulmonary: No home oxygen, no productive cough, no hemoptysis,  No asthma or wheezing  Musculoskeletal:  [ ]  Arthritis, [ ]  Low back pain,  [ ]  Joint pain  Hematologic:No history of hypercoagulable state.  No history of easy bleeding.  No history of anemia  Gastrointestinal: No hematochezia or melena,  No gastroesophageal reflux, no trouble swallowing  Urinary: [ ]  chronic Kidney disease, [x ] on HD - [ ]  MWF or [x ] TTHS, [ ]  Burning with urination, [ ]  Frequent urination, [ ]  Difficulty urinating;   Skin: No rashes, HD left UE graft  Psychological: No history of anxiety,  No history of depression   Physical Examination  Vitals:   05/20/17 1253 05/20/17 1254  BP: (!) 162/88 (!) 160/88  Pulse: 83   Resp: 16   Temp: 97.8 F (36.6 C)   TempSrc: Oral   SpO2: 100%   Weight: 120 lb (54.4 kg)   Height: 5\' 4"  (1.626 m)     Body mass index is 20.6 kg/m.  General:  Alert and oriented, no acute distress HEENT: Normal Neck: No bruit or JVD Pulmonary: Clear to auscultation bilaterally Cardiac: Regular Rate and Rhythm without murmur Gastrointestinal: Soft, non-tender, non-distended, no mass, no scars Skin: No rash, thin non mobile skin over the lateral graft half, no ulcer or scab Extremity Pulses:  2+ radial, brachial pulses bilaterally Musculoskeletal: No deformity or edema  Neurologic: Upper and lower extremity motor 5/5 and symmetric     ASSESSMENT:  ESRD with left UE graft skin thinning   PLAN: The skin is thin and shiny over the graft, but there is no ulcer or scab showing evidence of skin break down.  As long as this area is not used we will not  intervene at this time.  It would require revision of the lateral limb which could cause a sacrifice of the whole graft in the long run.   F/U PRN   Theda Sers, Lovely Kerins MAUREEN PA-C [facility]  The patient was seen in conjunction with DR. Early today  I have examined the patient, reviewed and agree with above.  Patient is not having  any difficulty regarding function of her access.  Does have adequate area for access and the dialysis staff is doing a nice job of avoiding the area of concern.  She has no breakdown.  There has been deterioration of the anterior wall of her Gore-Tex graft since it has been present for many years.  The skin is thin but there is no breakdown and no aneurysm.  I did discuss what to watch for and also what to do if she had any bleeding from this.  I would not recommend revision at this time.  She is comfortable with this recommendation and will see [place] again on an as needed basis  Curt Jews, MD 05/20/2017 1:32 PM

## 2017-05-21 DIAGNOSIS — N2581 Secondary hyperparathyroidism of renal origin: Secondary | ICD-10-CM | POA: Diagnosis not present

## 2017-05-21 DIAGNOSIS — E1029 Type 1 diabetes mellitus with other diabetic kidney complication: Secondary | ICD-10-CM | POA: Diagnosis not present

## 2017-05-21 DIAGNOSIS — D631 Anemia in chronic kidney disease: Secondary | ICD-10-CM | POA: Diagnosis not present

## 2017-05-21 DIAGNOSIS — N186 End stage renal disease: Secondary | ICD-10-CM | POA: Diagnosis not present

## 2017-05-21 DIAGNOSIS — R739 Hyperglycemia, unspecified: Secondary | ICD-10-CM | POA: Diagnosis not present

## 2017-05-23 DIAGNOSIS — D631 Anemia in chronic kidney disease: Secondary | ICD-10-CM | POA: Diagnosis not present

## 2017-05-23 DIAGNOSIS — N2581 Secondary hyperparathyroidism of renal origin: Secondary | ICD-10-CM | POA: Diagnosis not present

## 2017-05-23 DIAGNOSIS — R739 Hyperglycemia, unspecified: Secondary | ICD-10-CM | POA: Diagnosis not present

## 2017-05-23 DIAGNOSIS — E1029 Type 1 diabetes mellitus with other diabetic kidney complication: Secondary | ICD-10-CM | POA: Diagnosis not present

## 2017-05-23 DIAGNOSIS — N186 End stage renal disease: Secondary | ICD-10-CM | POA: Diagnosis not present

## 2017-05-26 DIAGNOSIS — E1029 Type 1 diabetes mellitus with other diabetic kidney complication: Secondary | ICD-10-CM | POA: Diagnosis not present

## 2017-05-26 DIAGNOSIS — N2581 Secondary hyperparathyroidism of renal origin: Secondary | ICD-10-CM | POA: Diagnosis not present

## 2017-05-26 DIAGNOSIS — N186 End stage renal disease: Secondary | ICD-10-CM | POA: Diagnosis not present

## 2017-05-26 DIAGNOSIS — R739 Hyperglycemia, unspecified: Secondary | ICD-10-CM | POA: Diagnosis not present

## 2017-05-26 DIAGNOSIS — D631 Anemia in chronic kidney disease: Secondary | ICD-10-CM | POA: Diagnosis not present

## 2017-05-28 DIAGNOSIS — N2581 Secondary hyperparathyroidism of renal origin: Secondary | ICD-10-CM | POA: Diagnosis not present

## 2017-05-28 DIAGNOSIS — E1029 Type 1 diabetes mellitus with other diabetic kidney complication: Secondary | ICD-10-CM | POA: Diagnosis not present

## 2017-05-28 DIAGNOSIS — R739 Hyperglycemia, unspecified: Secondary | ICD-10-CM | POA: Diagnosis not present

## 2017-05-28 DIAGNOSIS — N186 End stage renal disease: Secondary | ICD-10-CM | POA: Diagnosis not present

## 2017-05-28 DIAGNOSIS — D631 Anemia in chronic kidney disease: Secondary | ICD-10-CM | POA: Diagnosis not present

## 2017-05-30 DIAGNOSIS — R739 Hyperglycemia, unspecified: Secondary | ICD-10-CM | POA: Diagnosis not present

## 2017-05-30 DIAGNOSIS — D631 Anemia in chronic kidney disease: Secondary | ICD-10-CM | POA: Diagnosis not present

## 2017-05-30 DIAGNOSIS — E1029 Type 1 diabetes mellitus with other diabetic kidney complication: Secondary | ICD-10-CM | POA: Diagnosis not present

## 2017-05-30 DIAGNOSIS — N186 End stage renal disease: Secondary | ICD-10-CM | POA: Diagnosis not present

## 2017-05-30 DIAGNOSIS — N2581 Secondary hyperparathyroidism of renal origin: Secondary | ICD-10-CM | POA: Diagnosis not present

## 2017-06-02 DIAGNOSIS — N186 End stage renal disease: Secondary | ICD-10-CM | POA: Diagnosis not present

## 2017-06-02 DIAGNOSIS — N2581 Secondary hyperparathyroidism of renal origin: Secondary | ICD-10-CM | POA: Diagnosis not present

## 2017-06-02 DIAGNOSIS — E1029 Type 1 diabetes mellitus with other diabetic kidney complication: Secondary | ICD-10-CM | POA: Diagnosis not present

## 2017-06-02 DIAGNOSIS — R739 Hyperglycemia, unspecified: Secondary | ICD-10-CM | POA: Diagnosis not present

## 2017-06-02 DIAGNOSIS — D631 Anemia in chronic kidney disease: Secondary | ICD-10-CM | POA: Diagnosis not present

## 2017-06-04 DIAGNOSIS — E1029 Type 1 diabetes mellitus with other diabetic kidney complication: Secondary | ICD-10-CM | POA: Diagnosis not present

## 2017-06-04 DIAGNOSIS — N186 End stage renal disease: Secondary | ICD-10-CM | POA: Diagnosis not present

## 2017-06-04 DIAGNOSIS — N2581 Secondary hyperparathyroidism of renal origin: Secondary | ICD-10-CM | POA: Diagnosis not present

## 2017-06-04 DIAGNOSIS — D631 Anemia in chronic kidney disease: Secondary | ICD-10-CM | POA: Diagnosis not present

## 2017-06-04 DIAGNOSIS — R739 Hyperglycemia, unspecified: Secondary | ICD-10-CM | POA: Diagnosis not present

## 2017-06-06 DIAGNOSIS — E1029 Type 1 diabetes mellitus with other diabetic kidney complication: Secondary | ICD-10-CM | POA: Diagnosis not present

## 2017-06-06 DIAGNOSIS — N2581 Secondary hyperparathyroidism of renal origin: Secondary | ICD-10-CM | POA: Diagnosis not present

## 2017-06-06 DIAGNOSIS — R739 Hyperglycemia, unspecified: Secondary | ICD-10-CM | POA: Diagnosis not present

## 2017-06-06 DIAGNOSIS — N186 End stage renal disease: Secondary | ICD-10-CM | POA: Diagnosis not present

## 2017-06-06 DIAGNOSIS — D631 Anemia in chronic kidney disease: Secondary | ICD-10-CM | POA: Diagnosis not present

## 2017-06-08 DIAGNOSIS — N186 End stage renal disease: Secondary | ICD-10-CM | POA: Diagnosis not present

## 2017-06-08 DIAGNOSIS — E1029 Type 1 diabetes mellitus with other diabetic kidney complication: Secondary | ICD-10-CM | POA: Diagnosis not present

## 2017-06-08 DIAGNOSIS — D631 Anemia in chronic kidney disease: Secondary | ICD-10-CM | POA: Diagnosis not present

## 2017-06-08 DIAGNOSIS — N2581 Secondary hyperparathyroidism of renal origin: Secondary | ICD-10-CM | POA: Diagnosis not present

## 2017-06-08 DIAGNOSIS — R739 Hyperglycemia, unspecified: Secondary | ICD-10-CM | POA: Diagnosis not present

## 2017-06-10 DIAGNOSIS — N186 End stage renal disease: Secondary | ICD-10-CM | POA: Diagnosis not present

## 2017-06-10 DIAGNOSIS — E1029 Type 1 diabetes mellitus with other diabetic kidney complication: Secondary | ICD-10-CM | POA: Diagnosis not present

## 2017-06-10 DIAGNOSIS — R739 Hyperglycemia, unspecified: Secondary | ICD-10-CM | POA: Diagnosis not present

## 2017-06-10 DIAGNOSIS — D631 Anemia in chronic kidney disease: Secondary | ICD-10-CM | POA: Diagnosis not present

## 2017-06-10 DIAGNOSIS — N2581 Secondary hyperparathyroidism of renal origin: Secondary | ICD-10-CM | POA: Diagnosis not present

## 2017-06-13 DIAGNOSIS — N186 End stage renal disease: Secondary | ICD-10-CM | POA: Diagnosis not present

## 2017-06-13 DIAGNOSIS — E1029 Type 1 diabetes mellitus with other diabetic kidney complication: Secondary | ICD-10-CM | POA: Diagnosis not present

## 2017-06-13 DIAGNOSIS — R739 Hyperglycemia, unspecified: Secondary | ICD-10-CM | POA: Diagnosis not present

## 2017-06-13 DIAGNOSIS — N2581 Secondary hyperparathyroidism of renal origin: Secondary | ICD-10-CM | POA: Diagnosis not present

## 2017-06-13 DIAGNOSIS — D631 Anemia in chronic kidney disease: Secondary | ICD-10-CM | POA: Diagnosis not present

## 2017-06-15 DIAGNOSIS — I10 Essential (primary) hypertension: Secondary | ICD-10-CM | POA: Diagnosis not present

## 2017-06-15 DIAGNOSIS — Z794 Long term (current) use of insulin: Secondary | ICD-10-CM | POA: Diagnosis not present

## 2017-06-15 DIAGNOSIS — Z01118 Encounter for examination of ears and hearing with other abnormal findings: Secondary | ICD-10-CM | POA: Diagnosis not present

## 2017-06-15 DIAGNOSIS — E538 Deficiency of other specified B group vitamins: Secondary | ICD-10-CM | POA: Diagnosis not present

## 2017-06-15 DIAGNOSIS — E119 Type 2 diabetes mellitus without complications: Secondary | ICD-10-CM | POA: Diagnosis not present

## 2017-06-15 DIAGNOSIS — I119 Hypertensive heart disease without heart failure: Secondary | ICD-10-CM | POA: Diagnosis not present

## 2017-06-15 DIAGNOSIS — H538 Other visual disturbances: Secondary | ICD-10-CM | POA: Diagnosis not present

## 2017-06-15 DIAGNOSIS — N186 End stage renal disease: Secondary | ICD-10-CM | POA: Diagnosis not present

## 2017-06-15 DIAGNOSIS — D638 Anemia in other chronic diseases classified elsewhere: Secondary | ICD-10-CM | POA: Diagnosis not present

## 2017-06-15 DIAGNOSIS — H9209 Otalgia, unspecified ear: Secondary | ICD-10-CM | POA: Diagnosis not present

## 2017-06-15 DIAGNOSIS — E785 Hyperlipidemia, unspecified: Secondary | ICD-10-CM | POA: Diagnosis not present

## 2017-06-15 DIAGNOSIS — G629 Polyneuropathy, unspecified: Secondary | ICD-10-CM | POA: Diagnosis not present

## 2017-06-15 DIAGNOSIS — Z992 Dependence on renal dialysis: Secondary | ICD-10-CM | POA: Diagnosis not present

## 2017-06-16 DIAGNOSIS — D631 Anemia in chronic kidney disease: Secondary | ICD-10-CM | POA: Diagnosis not present

## 2017-06-16 DIAGNOSIS — N186 End stage renal disease: Secondary | ICD-10-CM | POA: Diagnosis not present

## 2017-06-16 DIAGNOSIS — N2581 Secondary hyperparathyroidism of renal origin: Secondary | ICD-10-CM | POA: Diagnosis not present

## 2017-06-16 DIAGNOSIS — R739 Hyperglycemia, unspecified: Secondary | ICD-10-CM | POA: Diagnosis not present

## 2017-06-16 DIAGNOSIS — E1029 Type 1 diabetes mellitus with other diabetic kidney complication: Secondary | ICD-10-CM | POA: Diagnosis not present

## 2017-06-18 DIAGNOSIS — E1029 Type 1 diabetes mellitus with other diabetic kidney complication: Secondary | ICD-10-CM | POA: Diagnosis not present

## 2017-06-18 DIAGNOSIS — D631 Anemia in chronic kidney disease: Secondary | ICD-10-CM | POA: Diagnosis not present

## 2017-06-18 DIAGNOSIS — R739 Hyperglycemia, unspecified: Secondary | ICD-10-CM | POA: Diagnosis not present

## 2017-06-18 DIAGNOSIS — N2581 Secondary hyperparathyroidism of renal origin: Secondary | ICD-10-CM | POA: Diagnosis not present

## 2017-06-18 DIAGNOSIS — N186 End stage renal disease: Secondary | ICD-10-CM | POA: Diagnosis not present

## 2017-06-19 DIAGNOSIS — Z992 Dependence on renal dialysis: Secondary | ICD-10-CM | POA: Diagnosis not present

## 2017-06-19 DIAGNOSIS — E1029 Type 1 diabetes mellitus with other diabetic kidney complication: Secondary | ICD-10-CM | POA: Diagnosis not present

## 2017-06-19 DIAGNOSIS — N186 End stage renal disease: Secondary | ICD-10-CM | POA: Diagnosis not present

## 2017-06-20 DIAGNOSIS — N2581 Secondary hyperparathyroidism of renal origin: Secondary | ICD-10-CM | POA: Diagnosis not present

## 2017-06-20 DIAGNOSIS — N186 End stage renal disease: Secondary | ICD-10-CM | POA: Diagnosis not present

## 2017-06-20 DIAGNOSIS — R739 Hyperglycemia, unspecified: Secondary | ICD-10-CM | POA: Diagnosis not present

## 2017-06-20 DIAGNOSIS — E1029 Type 1 diabetes mellitus with other diabetic kidney complication: Secondary | ICD-10-CM | POA: Diagnosis not present

## 2017-06-20 DIAGNOSIS — D631 Anemia in chronic kidney disease: Secondary | ICD-10-CM | POA: Diagnosis not present

## 2017-06-23 DIAGNOSIS — D631 Anemia in chronic kidney disease: Secondary | ICD-10-CM | POA: Diagnosis not present

## 2017-06-23 DIAGNOSIS — N2581 Secondary hyperparathyroidism of renal origin: Secondary | ICD-10-CM | POA: Diagnosis not present

## 2017-06-23 DIAGNOSIS — N186 End stage renal disease: Secondary | ICD-10-CM | POA: Diagnosis not present

## 2017-06-23 DIAGNOSIS — E1029 Type 1 diabetes mellitus with other diabetic kidney complication: Secondary | ICD-10-CM | POA: Diagnosis not present

## 2017-06-23 DIAGNOSIS — R739 Hyperglycemia, unspecified: Secondary | ICD-10-CM | POA: Diagnosis not present

## 2017-06-25 DIAGNOSIS — R739 Hyperglycemia, unspecified: Secondary | ICD-10-CM | POA: Diagnosis not present

## 2017-06-25 DIAGNOSIS — E1029 Type 1 diabetes mellitus with other diabetic kidney complication: Secondary | ICD-10-CM | POA: Diagnosis not present

## 2017-06-25 DIAGNOSIS — N2581 Secondary hyperparathyroidism of renal origin: Secondary | ICD-10-CM | POA: Diagnosis not present

## 2017-06-25 DIAGNOSIS — D631 Anemia in chronic kidney disease: Secondary | ICD-10-CM | POA: Diagnosis not present

## 2017-06-25 DIAGNOSIS — N186 End stage renal disease: Secondary | ICD-10-CM | POA: Diagnosis not present

## 2017-06-27 DIAGNOSIS — R739 Hyperglycemia, unspecified: Secondary | ICD-10-CM | POA: Diagnosis not present

## 2017-06-27 DIAGNOSIS — N186 End stage renal disease: Secondary | ICD-10-CM | POA: Diagnosis not present

## 2017-06-27 DIAGNOSIS — E1029 Type 1 diabetes mellitus with other diabetic kidney complication: Secondary | ICD-10-CM | POA: Diagnosis not present

## 2017-06-27 DIAGNOSIS — D631 Anemia in chronic kidney disease: Secondary | ICD-10-CM | POA: Diagnosis not present

## 2017-06-27 DIAGNOSIS — N2581 Secondary hyperparathyroidism of renal origin: Secondary | ICD-10-CM | POA: Diagnosis not present

## 2017-07-02 DIAGNOSIS — N2581 Secondary hyperparathyroidism of renal origin: Secondary | ICD-10-CM | POA: Diagnosis not present

## 2017-07-02 DIAGNOSIS — N186 End stage renal disease: Secondary | ICD-10-CM | POA: Diagnosis not present

## 2017-07-02 DIAGNOSIS — R739 Hyperglycemia, unspecified: Secondary | ICD-10-CM | POA: Diagnosis not present

## 2017-07-02 DIAGNOSIS — D631 Anemia in chronic kidney disease: Secondary | ICD-10-CM | POA: Diagnosis not present

## 2017-07-02 DIAGNOSIS — E1029 Type 1 diabetes mellitus with other diabetic kidney complication: Secondary | ICD-10-CM | POA: Diagnosis not present

## 2017-07-04 DIAGNOSIS — N2581 Secondary hyperparathyroidism of renal origin: Secondary | ICD-10-CM | POA: Diagnosis not present

## 2017-07-04 DIAGNOSIS — N186 End stage renal disease: Secondary | ICD-10-CM | POA: Diagnosis not present

## 2017-07-04 DIAGNOSIS — D631 Anemia in chronic kidney disease: Secondary | ICD-10-CM | POA: Diagnosis not present

## 2017-07-04 DIAGNOSIS — R739 Hyperglycemia, unspecified: Secondary | ICD-10-CM | POA: Diagnosis not present

## 2017-07-04 DIAGNOSIS — E1029 Type 1 diabetes mellitus with other diabetic kidney complication: Secondary | ICD-10-CM | POA: Diagnosis not present

## 2017-07-07 DIAGNOSIS — D631 Anemia in chronic kidney disease: Secondary | ICD-10-CM | POA: Diagnosis not present

## 2017-07-07 DIAGNOSIS — N186 End stage renal disease: Secondary | ICD-10-CM | POA: Diagnosis not present

## 2017-07-07 DIAGNOSIS — E1029 Type 1 diabetes mellitus with other diabetic kidney complication: Secondary | ICD-10-CM | POA: Diagnosis not present

## 2017-07-07 DIAGNOSIS — R739 Hyperglycemia, unspecified: Secondary | ICD-10-CM | POA: Diagnosis not present

## 2017-07-07 DIAGNOSIS — N2581 Secondary hyperparathyroidism of renal origin: Secondary | ICD-10-CM | POA: Diagnosis not present

## 2017-07-09 DIAGNOSIS — D631 Anemia in chronic kidney disease: Secondary | ICD-10-CM | POA: Diagnosis not present

## 2017-07-09 DIAGNOSIS — E1029 Type 1 diabetes mellitus with other diabetic kidney complication: Secondary | ICD-10-CM | POA: Diagnosis not present

## 2017-07-09 DIAGNOSIS — N186 End stage renal disease: Secondary | ICD-10-CM | POA: Diagnosis not present

## 2017-07-09 DIAGNOSIS — R739 Hyperglycemia, unspecified: Secondary | ICD-10-CM | POA: Diagnosis not present

## 2017-07-09 DIAGNOSIS — N2581 Secondary hyperparathyroidism of renal origin: Secondary | ICD-10-CM | POA: Diagnosis not present

## 2017-07-11 DIAGNOSIS — R739 Hyperglycemia, unspecified: Secondary | ICD-10-CM | POA: Diagnosis not present

## 2017-07-11 DIAGNOSIS — E1029 Type 1 diabetes mellitus with other diabetic kidney complication: Secondary | ICD-10-CM | POA: Diagnosis not present

## 2017-07-11 DIAGNOSIS — D631 Anemia in chronic kidney disease: Secondary | ICD-10-CM | POA: Diagnosis not present

## 2017-07-11 DIAGNOSIS — N2581 Secondary hyperparathyroidism of renal origin: Secondary | ICD-10-CM | POA: Diagnosis not present

## 2017-07-11 DIAGNOSIS — N186 End stage renal disease: Secondary | ICD-10-CM | POA: Diagnosis not present

## 2017-07-13 DIAGNOSIS — R739 Hyperglycemia, unspecified: Secondary | ICD-10-CM | POA: Diagnosis not present

## 2017-07-13 DIAGNOSIS — N186 End stage renal disease: Secondary | ICD-10-CM | POA: Diagnosis not present

## 2017-07-13 DIAGNOSIS — D631 Anemia in chronic kidney disease: Secondary | ICD-10-CM | POA: Diagnosis not present

## 2017-07-13 DIAGNOSIS — E1029 Type 1 diabetes mellitus with other diabetic kidney complication: Secondary | ICD-10-CM | POA: Diagnosis not present

## 2017-07-13 DIAGNOSIS — N2581 Secondary hyperparathyroidism of renal origin: Secondary | ICD-10-CM | POA: Diagnosis not present

## 2017-07-16 DIAGNOSIS — N2581 Secondary hyperparathyroidism of renal origin: Secondary | ICD-10-CM | POA: Diagnosis not present

## 2017-07-16 DIAGNOSIS — N186 End stage renal disease: Secondary | ICD-10-CM | POA: Diagnosis not present

## 2017-07-16 DIAGNOSIS — E1029 Type 1 diabetes mellitus with other diabetic kidney complication: Secondary | ICD-10-CM | POA: Diagnosis not present

## 2017-07-16 DIAGNOSIS — R739 Hyperglycemia, unspecified: Secondary | ICD-10-CM | POA: Diagnosis not present

## 2017-07-16 DIAGNOSIS — D631 Anemia in chronic kidney disease: Secondary | ICD-10-CM | POA: Diagnosis not present

## 2017-07-18 DIAGNOSIS — E1029 Type 1 diabetes mellitus with other diabetic kidney complication: Secondary | ICD-10-CM | POA: Diagnosis not present

## 2017-07-18 DIAGNOSIS — N2581 Secondary hyperparathyroidism of renal origin: Secondary | ICD-10-CM | POA: Diagnosis not present

## 2017-07-18 DIAGNOSIS — N186 End stage renal disease: Secondary | ICD-10-CM | POA: Diagnosis not present

## 2017-07-18 DIAGNOSIS — R739 Hyperglycemia, unspecified: Secondary | ICD-10-CM | POA: Diagnosis not present

## 2017-07-18 DIAGNOSIS — D631 Anemia in chronic kidney disease: Secondary | ICD-10-CM | POA: Diagnosis not present

## 2017-07-20 DIAGNOSIS — N186 End stage renal disease: Secondary | ICD-10-CM | POA: Diagnosis not present

## 2017-07-20 DIAGNOSIS — R739 Hyperglycemia, unspecified: Secondary | ICD-10-CM | POA: Diagnosis not present

## 2017-07-20 DIAGNOSIS — E1029 Type 1 diabetes mellitus with other diabetic kidney complication: Secondary | ICD-10-CM | POA: Diagnosis not present

## 2017-07-20 DIAGNOSIS — N2581 Secondary hyperparathyroidism of renal origin: Secondary | ICD-10-CM | POA: Diagnosis not present

## 2017-07-20 DIAGNOSIS — D631 Anemia in chronic kidney disease: Secondary | ICD-10-CM | POA: Diagnosis not present

## 2017-07-20 DIAGNOSIS — Z992 Dependence on renal dialysis: Secondary | ICD-10-CM | POA: Diagnosis not present

## 2017-07-23 DIAGNOSIS — N186 End stage renal disease: Secondary | ICD-10-CM | POA: Diagnosis not present

## 2017-07-23 DIAGNOSIS — N2581 Secondary hyperparathyroidism of renal origin: Secondary | ICD-10-CM | POA: Diagnosis not present

## 2017-07-23 DIAGNOSIS — R739 Hyperglycemia, unspecified: Secondary | ICD-10-CM | POA: Diagnosis not present

## 2017-07-23 DIAGNOSIS — E1029 Type 1 diabetes mellitus with other diabetic kidney complication: Secondary | ICD-10-CM | POA: Diagnosis not present

## 2017-07-23 DIAGNOSIS — D631 Anemia in chronic kidney disease: Secondary | ICD-10-CM | POA: Diagnosis not present

## 2017-07-25 DIAGNOSIS — R739 Hyperglycemia, unspecified: Secondary | ICD-10-CM | POA: Diagnosis not present

## 2017-07-25 DIAGNOSIS — N186 End stage renal disease: Secondary | ICD-10-CM | POA: Diagnosis not present

## 2017-07-25 DIAGNOSIS — N2581 Secondary hyperparathyroidism of renal origin: Secondary | ICD-10-CM | POA: Diagnosis not present

## 2017-07-25 DIAGNOSIS — E1029 Type 1 diabetes mellitus with other diabetic kidney complication: Secondary | ICD-10-CM | POA: Diagnosis not present

## 2017-07-25 DIAGNOSIS — D631 Anemia in chronic kidney disease: Secondary | ICD-10-CM | POA: Diagnosis not present

## 2017-07-28 DIAGNOSIS — R739 Hyperglycemia, unspecified: Secondary | ICD-10-CM | POA: Diagnosis not present

## 2017-07-28 DIAGNOSIS — E1029 Type 1 diabetes mellitus with other diabetic kidney complication: Secondary | ICD-10-CM | POA: Diagnosis not present

## 2017-07-28 DIAGNOSIS — N186 End stage renal disease: Secondary | ICD-10-CM | POA: Diagnosis not present

## 2017-07-28 DIAGNOSIS — N2581 Secondary hyperparathyroidism of renal origin: Secondary | ICD-10-CM | POA: Diagnosis not present

## 2017-07-28 DIAGNOSIS — D631 Anemia in chronic kidney disease: Secondary | ICD-10-CM | POA: Diagnosis not present

## 2017-07-30 DIAGNOSIS — R739 Hyperglycemia, unspecified: Secondary | ICD-10-CM | POA: Diagnosis not present

## 2017-07-30 DIAGNOSIS — N2581 Secondary hyperparathyroidism of renal origin: Secondary | ICD-10-CM | POA: Diagnosis not present

## 2017-07-30 DIAGNOSIS — E1029 Type 1 diabetes mellitus with other diabetic kidney complication: Secondary | ICD-10-CM | POA: Diagnosis not present

## 2017-07-30 DIAGNOSIS — D631 Anemia in chronic kidney disease: Secondary | ICD-10-CM | POA: Diagnosis not present

## 2017-07-30 DIAGNOSIS — N186 End stage renal disease: Secondary | ICD-10-CM | POA: Diagnosis not present

## 2017-08-01 DIAGNOSIS — R739 Hyperglycemia, unspecified: Secondary | ICD-10-CM | POA: Diagnosis not present

## 2017-08-01 DIAGNOSIS — D631 Anemia in chronic kidney disease: Secondary | ICD-10-CM | POA: Diagnosis not present

## 2017-08-01 DIAGNOSIS — N186 End stage renal disease: Secondary | ICD-10-CM | POA: Diagnosis not present

## 2017-08-01 DIAGNOSIS — N2581 Secondary hyperparathyroidism of renal origin: Secondary | ICD-10-CM | POA: Diagnosis not present

## 2017-08-01 DIAGNOSIS — E1029 Type 1 diabetes mellitus with other diabetic kidney complication: Secondary | ICD-10-CM | POA: Diagnosis not present

## 2017-08-04 DIAGNOSIS — N186 End stage renal disease: Secondary | ICD-10-CM | POA: Diagnosis not present

## 2017-08-04 DIAGNOSIS — E1029 Type 1 diabetes mellitus with other diabetic kidney complication: Secondary | ICD-10-CM | POA: Diagnosis not present

## 2017-08-04 DIAGNOSIS — N2581 Secondary hyperparathyroidism of renal origin: Secondary | ICD-10-CM | POA: Diagnosis not present

## 2017-08-04 DIAGNOSIS — D631 Anemia in chronic kidney disease: Secondary | ICD-10-CM | POA: Diagnosis not present

## 2017-08-04 DIAGNOSIS — R739 Hyperglycemia, unspecified: Secondary | ICD-10-CM | POA: Diagnosis not present

## 2017-08-06 DIAGNOSIS — D631 Anemia in chronic kidney disease: Secondary | ICD-10-CM | POA: Diagnosis not present

## 2017-08-06 DIAGNOSIS — N186 End stage renal disease: Secondary | ICD-10-CM | POA: Diagnosis not present

## 2017-08-06 DIAGNOSIS — N2581 Secondary hyperparathyroidism of renal origin: Secondary | ICD-10-CM | POA: Diagnosis not present

## 2017-08-06 DIAGNOSIS — E1029 Type 1 diabetes mellitus with other diabetic kidney complication: Secondary | ICD-10-CM | POA: Diagnosis not present

## 2017-08-06 DIAGNOSIS — R739 Hyperglycemia, unspecified: Secondary | ICD-10-CM | POA: Diagnosis not present

## 2017-08-08 DIAGNOSIS — N186 End stage renal disease: Secondary | ICD-10-CM | POA: Diagnosis not present

## 2017-08-08 DIAGNOSIS — D631 Anemia in chronic kidney disease: Secondary | ICD-10-CM | POA: Diagnosis not present

## 2017-08-08 DIAGNOSIS — N2581 Secondary hyperparathyroidism of renal origin: Secondary | ICD-10-CM | POA: Diagnosis not present

## 2017-08-08 DIAGNOSIS — R739 Hyperglycemia, unspecified: Secondary | ICD-10-CM | POA: Diagnosis not present

## 2017-08-08 DIAGNOSIS — E1029 Type 1 diabetes mellitus with other diabetic kidney complication: Secondary | ICD-10-CM | POA: Diagnosis not present

## 2017-08-11 DIAGNOSIS — N2581 Secondary hyperparathyroidism of renal origin: Secondary | ICD-10-CM | POA: Diagnosis not present

## 2017-08-11 DIAGNOSIS — N186 End stage renal disease: Secondary | ICD-10-CM | POA: Diagnosis not present

## 2017-08-11 DIAGNOSIS — R739 Hyperglycemia, unspecified: Secondary | ICD-10-CM | POA: Diagnosis not present

## 2017-08-11 DIAGNOSIS — D631 Anemia in chronic kidney disease: Secondary | ICD-10-CM | POA: Diagnosis not present

## 2017-08-11 DIAGNOSIS — E1029 Type 1 diabetes mellitus with other diabetic kidney complication: Secondary | ICD-10-CM | POA: Diagnosis not present

## 2017-08-13 DIAGNOSIS — N186 End stage renal disease: Secondary | ICD-10-CM | POA: Diagnosis not present

## 2017-08-13 DIAGNOSIS — D631 Anemia in chronic kidney disease: Secondary | ICD-10-CM | POA: Diagnosis not present

## 2017-08-13 DIAGNOSIS — R739 Hyperglycemia, unspecified: Secondary | ICD-10-CM | POA: Diagnosis not present

## 2017-08-13 DIAGNOSIS — N2581 Secondary hyperparathyroidism of renal origin: Secondary | ICD-10-CM | POA: Diagnosis not present

## 2017-08-13 DIAGNOSIS — E1029 Type 1 diabetes mellitus with other diabetic kidney complication: Secondary | ICD-10-CM | POA: Diagnosis not present

## 2017-08-15 DIAGNOSIS — D631 Anemia in chronic kidney disease: Secondary | ICD-10-CM | POA: Diagnosis not present

## 2017-08-15 DIAGNOSIS — R739 Hyperglycemia, unspecified: Secondary | ICD-10-CM | POA: Diagnosis not present

## 2017-08-15 DIAGNOSIS — N2581 Secondary hyperparathyroidism of renal origin: Secondary | ICD-10-CM | POA: Diagnosis not present

## 2017-08-15 DIAGNOSIS — N186 End stage renal disease: Secondary | ICD-10-CM | POA: Diagnosis not present

## 2017-08-15 DIAGNOSIS — E1029 Type 1 diabetes mellitus with other diabetic kidney complication: Secondary | ICD-10-CM | POA: Diagnosis not present

## 2017-08-17 DIAGNOSIS — N186 End stage renal disease: Secondary | ICD-10-CM | POA: Diagnosis not present

## 2017-08-17 DIAGNOSIS — E1022 Type 1 diabetes mellitus with diabetic chronic kidney disease: Secondary | ICD-10-CM | POA: Diagnosis not present

## 2017-08-17 DIAGNOSIS — E103592 Type 1 diabetes mellitus with proliferative diabetic retinopathy without macular edema, left eye: Secondary | ICD-10-CM | POA: Diagnosis not present

## 2017-08-17 DIAGNOSIS — Z992 Dependence on renal dialysis: Secondary | ICD-10-CM | POA: Diagnosis not present

## 2017-08-18 DIAGNOSIS — N2581 Secondary hyperparathyroidism of renal origin: Secondary | ICD-10-CM | POA: Diagnosis not present

## 2017-08-18 DIAGNOSIS — R739 Hyperglycemia, unspecified: Secondary | ICD-10-CM | POA: Diagnosis not present

## 2017-08-18 DIAGNOSIS — E1029 Type 1 diabetes mellitus with other diabetic kidney complication: Secondary | ICD-10-CM | POA: Diagnosis not present

## 2017-08-18 DIAGNOSIS — D631 Anemia in chronic kidney disease: Secondary | ICD-10-CM | POA: Diagnosis not present

## 2017-08-18 DIAGNOSIS — N186 End stage renal disease: Secondary | ICD-10-CM | POA: Diagnosis not present

## 2017-08-20 DIAGNOSIS — Z992 Dependence on renal dialysis: Secondary | ICD-10-CM | POA: Diagnosis not present

## 2017-08-20 DIAGNOSIS — N186 End stage renal disease: Secondary | ICD-10-CM | POA: Diagnosis not present

## 2017-08-20 DIAGNOSIS — E1029 Type 1 diabetes mellitus with other diabetic kidney complication: Secondary | ICD-10-CM | POA: Diagnosis not present

## 2017-08-20 DIAGNOSIS — R739 Hyperglycemia, unspecified: Secondary | ICD-10-CM | POA: Diagnosis not present

## 2017-08-20 DIAGNOSIS — N2581 Secondary hyperparathyroidism of renal origin: Secondary | ICD-10-CM | POA: Diagnosis not present

## 2017-08-20 DIAGNOSIS — D631 Anemia in chronic kidney disease: Secondary | ICD-10-CM | POA: Diagnosis not present

## 2017-08-21 DIAGNOSIS — Z992 Dependence on renal dialysis: Secondary | ICD-10-CM | POA: Diagnosis not present

## 2017-08-21 DIAGNOSIS — E1029 Type 1 diabetes mellitus with other diabetic kidney complication: Secondary | ICD-10-CM | POA: Diagnosis not present

## 2017-08-21 DIAGNOSIS — N186 End stage renal disease: Secondary | ICD-10-CM | POA: Diagnosis not present

## 2017-08-22 DIAGNOSIS — E1029 Type 1 diabetes mellitus with other diabetic kidney complication: Secondary | ICD-10-CM | POA: Diagnosis not present

## 2017-08-22 DIAGNOSIS — D631 Anemia in chronic kidney disease: Secondary | ICD-10-CM | POA: Diagnosis not present

## 2017-08-22 DIAGNOSIS — N186 End stage renal disease: Secondary | ICD-10-CM | POA: Diagnosis not present

## 2017-08-22 DIAGNOSIS — N2581 Secondary hyperparathyroidism of renal origin: Secondary | ICD-10-CM | POA: Diagnosis not present

## 2017-08-22 DIAGNOSIS — R739 Hyperglycemia, unspecified: Secondary | ICD-10-CM | POA: Diagnosis not present

## 2017-08-22 DIAGNOSIS — D509 Iron deficiency anemia, unspecified: Secondary | ICD-10-CM | POA: Diagnosis not present

## 2017-08-25 DIAGNOSIS — N186 End stage renal disease: Secondary | ICD-10-CM | POA: Diagnosis not present

## 2017-08-25 DIAGNOSIS — R739 Hyperglycemia, unspecified: Secondary | ICD-10-CM | POA: Diagnosis not present

## 2017-08-25 DIAGNOSIS — N2581 Secondary hyperparathyroidism of renal origin: Secondary | ICD-10-CM | POA: Diagnosis not present

## 2017-08-25 DIAGNOSIS — E1029 Type 1 diabetes mellitus with other diabetic kidney complication: Secondary | ICD-10-CM | POA: Diagnosis not present

## 2017-08-25 DIAGNOSIS — D509 Iron deficiency anemia, unspecified: Secondary | ICD-10-CM | POA: Diagnosis not present

## 2017-08-25 DIAGNOSIS — D631 Anemia in chronic kidney disease: Secondary | ICD-10-CM | POA: Diagnosis not present

## 2017-08-27 DIAGNOSIS — N186 End stage renal disease: Secondary | ICD-10-CM | POA: Diagnosis not present

## 2017-08-27 DIAGNOSIS — E1029 Type 1 diabetes mellitus with other diabetic kidney complication: Secondary | ICD-10-CM | POA: Diagnosis not present

## 2017-08-27 DIAGNOSIS — R739 Hyperglycemia, unspecified: Secondary | ICD-10-CM | POA: Diagnosis not present

## 2017-08-27 DIAGNOSIS — D509 Iron deficiency anemia, unspecified: Secondary | ICD-10-CM | POA: Diagnosis not present

## 2017-08-27 DIAGNOSIS — N2581 Secondary hyperparathyroidism of renal origin: Secondary | ICD-10-CM | POA: Diagnosis not present

## 2017-08-27 DIAGNOSIS — D631 Anemia in chronic kidney disease: Secondary | ICD-10-CM | POA: Diagnosis not present

## 2017-08-28 ENCOUNTER — Other Ambulatory Visit: Payer: Self-pay

## 2017-08-28 DIAGNOSIS — N186 End stage renal disease: Secondary | ICD-10-CM

## 2017-08-28 DIAGNOSIS — Z01812 Encounter for preprocedural laboratory examination: Secondary | ICD-10-CM

## 2017-08-28 DIAGNOSIS — Z992 Dependence on renal dialysis: Principal | ICD-10-CM

## 2017-08-29 DIAGNOSIS — E1029 Type 1 diabetes mellitus with other diabetic kidney complication: Secondary | ICD-10-CM | POA: Diagnosis not present

## 2017-08-29 DIAGNOSIS — R739 Hyperglycemia, unspecified: Secondary | ICD-10-CM | POA: Diagnosis not present

## 2017-08-29 DIAGNOSIS — N2581 Secondary hyperparathyroidism of renal origin: Secondary | ICD-10-CM | POA: Diagnosis not present

## 2017-08-29 DIAGNOSIS — D509 Iron deficiency anemia, unspecified: Secondary | ICD-10-CM | POA: Diagnosis not present

## 2017-08-29 DIAGNOSIS — D631 Anemia in chronic kidney disease: Secondary | ICD-10-CM | POA: Diagnosis not present

## 2017-08-29 DIAGNOSIS — N186 End stage renal disease: Secondary | ICD-10-CM | POA: Diagnosis not present

## 2017-09-01 DIAGNOSIS — N186 End stage renal disease: Secondary | ICD-10-CM | POA: Diagnosis not present

## 2017-09-01 DIAGNOSIS — D509 Iron deficiency anemia, unspecified: Secondary | ICD-10-CM | POA: Diagnosis not present

## 2017-09-01 DIAGNOSIS — D631 Anemia in chronic kidney disease: Secondary | ICD-10-CM | POA: Diagnosis not present

## 2017-09-01 DIAGNOSIS — R739 Hyperglycemia, unspecified: Secondary | ICD-10-CM | POA: Diagnosis not present

## 2017-09-01 DIAGNOSIS — E1029 Type 1 diabetes mellitus with other diabetic kidney complication: Secondary | ICD-10-CM | POA: Diagnosis not present

## 2017-09-01 DIAGNOSIS — N2581 Secondary hyperparathyroidism of renal origin: Secondary | ICD-10-CM | POA: Diagnosis not present

## 2017-09-02 ENCOUNTER — Ambulatory Visit (INDEPENDENT_AMBULATORY_CARE_PROVIDER_SITE_OTHER): Payer: Medicare Other | Admitting: Vascular Surgery

## 2017-09-02 ENCOUNTER — Ambulatory Visit (INDEPENDENT_AMBULATORY_CARE_PROVIDER_SITE_OTHER)
Admission: RE | Admit: 2017-09-02 | Discharge: 2017-09-02 | Disposition: A | Discharge status: Home / Self Care | Payer: Medicare Other | Source: Ambulatory Visit | Attending: Vascular Surgery | Admitting: Vascular Surgery

## 2017-09-02 ENCOUNTER — Ambulatory Visit (HOSPITAL_COMMUNITY)
Admission: RE | Admit: 2017-09-02 | Discharge: 2017-09-02 | Disposition: A | Discharge status: Home / Self Care | Payer: Medicare Other | Source: Ambulatory Visit | Attending: Vascular Surgery | Admitting: Vascular Surgery

## 2017-09-02 ENCOUNTER — Encounter: Payer: Self-pay | Admitting: Vascular Surgery

## 2017-09-02 ENCOUNTER — Encounter: Payer: Self-pay | Admitting: *Deleted

## 2017-09-02 ENCOUNTER — Other Ambulatory Visit: Payer: Self-pay

## 2017-09-02 ENCOUNTER — Other Ambulatory Visit: Payer: Self-pay | Admitting: *Deleted

## 2017-09-02 VITALS — BP 158/90 | HR 81 | Temp 99.0°F | Resp 14 | Ht 63.0 in | Wt 120.0 lb

## 2017-09-02 DIAGNOSIS — Z992 Dependence on renal dialysis: Secondary | ICD-10-CM

## 2017-09-02 DIAGNOSIS — Z01812 Encounter for preprocedural laboratory examination: Secondary | ICD-10-CM | POA: Diagnosis not present

## 2017-09-02 DIAGNOSIS — N186 End stage renal disease: Secondary | ICD-10-CM

## 2017-09-02 NOTE — Progress Notes (Signed)
Patient name: [name] MRN: [id number] DOB: 11/26/1975 Sex: female   REASON FOR CONSULT:    To evaluate for hemodialysis access.  The consult is requested by Juanell Fairly, NP  HPI:   [name] is a pleasant 42 y.o. female, who presents for new hemodialysis access.  She dialyzes on Tuesdays Thursdays and Saturdays.  She has a left upper arm loop AV graft which I most recently revised a year ago in January 2018.  I felt that there would be no further revisions possible surgically on this graft.  The flows have dropped and we have been asked to place access in the right arm while they continue to use her graft in the left arm.  In this way we might be able to avoid a catheter.  She denies any recent uremic symptoms.  Specifically, she denies nausea, vomiting, fatigue, anorexia, or palpitations.  Past Medical History:  Diagnosis Date  . Anemia   . Arthritis   . Diabetes mellitus   . Diabetic retinopathy ([place])    Hx of laser Rx's  . DM type 1 (diabetes mellitus, type 1) ([place]) 09/12/2014   Age of onset for DM type 1 was age 44.     Marland Kitchen ESRD on hemodialysis ([place])    ESRD due to DM type I, age of onset DM 1 was age 83.  Went on dialysis in March 2012.  Gets HD now at [facility] on a TTS schedule.    Marland Kitchen ESRD on hemodialysis ([place]) 09/12/2014   ESRD due to DM type 1.  Started HD in 2012 at [facility].  Gets HD there on a TTS schedule.    Marland Kitchen GERD (gastroesophageal reflux disease)   . History of blood transfusion   . Hyperlipidemia   . Hypertension   . Peripheral vascular disease ([place])   . Pneumonia   . Stroke [facility])     Family History  Problem Relation Age of Onset  . Cancer Mother        breast  . Hypertension Father     SOCIAL HISTORY: Social History   Socioeconomic History  . Marital status: Single    Spouse name: Not on file  . Number of children: 1  . Years of education: BACHELOR'S  . Highest education level: Not on file  Social Needs  . Financial resource  strain: Not on file  . Food insecurity - worry: Not on file  . Food insecurity - inability: Not on file  . Transportation needs - medical: Not on file  . Transportation needs - non-medical: Not on file  Occupational History  . Not on file  Tobacco Use  . Smoking status: Never Smoker  . Smokeless tobacco: Never Used  Substance and Sexual Activity  . Alcohol use: No    Alcohol/week: 0.0 oz  . Drug use: No  . Sexual activity: Not on file  Other Topics Concern  . Not on file  Social History Narrative   ** Merged History Encounter **       Patient is single with 1 child. Patient is right handed. Patient has BS degree. Patient drinks 0 caffeine.    Allergies  Allergen Reactions  . Pollen Extract Other (See Comments)    Watery eyes    Current Outpatient Medications  Medication Sig Dispense Refill  . amLODipine (NORVASC) 10 MG tablet Take 10 mg by mouth daily.     Marland Kitchen aspirin 81 MG EC tablet Take 81 mg by mouth  daily. Swallow whole.    . calcium acetate (PHOSLO) 667 MG capsule Take 667 mg by mouth 3 (three) times daily with meals.      . cinacalcet (SENSIPAR) 30 MG tablet Take 30 mg by mouth daily.     . insulin aspart (NOVOLOG) 100 UNIT/ML injection novolog 100 units/ml via Insulin pump    . labetalol (NORMODYNE) 300 MG tablet Take 300 mg by mouth 3 (three) times daily.    . Pediatric Multiple Vit-C-FA (FLINSTONES GUMMIES OMEGA-3 DHA) CHEW Chew 1 tablet by mouth daily.     . rosuvastatin (CRESTOR) 10 MG tablet Take 10 mg by mouth every evening.     . famotidine (PEPCID AC) 10 MG chewable tablet Chew 10 mg by mouth daily as needed for heartburn.    Marland Kitchen GLUCAGON EMERGENCY 1 MG injection Inject 1 mg into the muscle once as needed (for severe low blood sugar).     . insulin aspart (NOVOLOG) 100 UNIT/ML FlexPen Inject 3-10 Units into the skin See admin instructions. Based on sliding scale 1 unit per 20g of carbs    . insulin glargine (LANTUS) 100 UNIT/ML injection Inject 10 Units into  the skin at bedtime.      No current facility-administered medications for this visit.     REVIEW OF SYSTEMS:  [X]  denotes positive finding, [ ]  denotes negative finding Cardiac  Comments:  Chest pain or chest pressure:    Shortness of breath upon exertion:    Short of breath when lying flat:    Irregular heart rhythm:        Vascular    Pain in calf, thigh, or hip brought on by ambulation:    Pain in feet at night that wakes you up from your sleep:     Blood clot in your veins:    Leg swelling:         Pulmonary    Oxygen at home:    Productive cough:     Wheezing:         Neurologic    Sudden weakness in arms or legs:     Sudden numbness in arms or legs:     Sudden onset of difficulty speaking or slurred speech:    Temporary loss of vision in one eye:     Problems with dizziness:         Gastrointestinal    Blood in stool:     Vomited blood:         Genitourinary    Burning when urinating:     Blood in urine:        Psychiatric    Major depression:         Hematologic    Bleeding problems:    Problems with blood clotting too easily:        Skin    Rashes or ulcers:        Constitutional    Fever or chills:     PHYSICAL EXAM:   Vitals:   09/02/17 1054 09/02/17 1057  BP: (!) 159/89 (!) 158/90  Pulse: 81 81  Resp: 14   Temp: 99 F (37.2 C)   SpO2: 94%   Weight: 120 lb (54.4 kg)   Height: 5\' 3"  (1.6 m)     GENERAL: The patient is a well-nourished female, in no acute distress. The vital signs are documented above. CARDIAC: There is a regular rate and rhythm.  VASCULAR: She has palpable brachial and radial pulses bilaterally. Her left upper  arm graft is pulsatile. PULMONARY: There is good air exchange bilaterally without wheezing or rales. ABDOMEN: Soft and non-tender with normal pitched bowel sounds.  MUSCULOSKELETAL: There are no major deformities or cyanosis. NEUROLOGIC: No focal weakness or paresthesias are detected. SKIN: There are no ulcers  or rashes noted. PSYCHIATRIC: The patient has a normal affect.  DATA:    VEIN MAP: I have independently interpreted her vein map today in the right arm.  The forearm and upper arm cephalic vein looked small and are likely not adequate for a fistula.  The basilic vein on the right is marginal in size but but might be adequate for a basilic vein transposition.  ARTERIAL DUPLEX RIGHT UPPER EXTREMITY: I have independently interpreted her duplex of the right upper extremity.  She has triphasic Doppler signals in the radial and ulnar positions on the right.  The brachial artery is 0.49 cm.  Of note she is noted to have a high bifurcation of the brachial artery.  MEDICAL ISSUES:   END-STAGE RENAL DISEASE: I think it would be reasonable to try to place access in the right arm while they continue to use her graft on the left.  The forearm and upper arm cephalic vein did not look adequate for a fistula.  She might potentially be a candidate for a basilic vein transposition.  I have explained that depending on the quality of the vein this might be done in 1 or 2 stages.  If the vein is clearly not adequate then we would likely place an AV graft. I have explained the indications for placement of an AV fistula or AV graft. I've explained that if at all possible we will place an AV fistula.  I have reviewed the risks of placement of an AV fistula including but not limited to: failure of the fistula to mature, need for subsequent interventions, and thrombosis. In addition I have reviewed the potential complications of placement of an AV graft. These risks include, but are not limited to, graft thrombosis, graft infection, wound healing problems, bleeding, arm swelling, and steal syndrome. All the patient's questions were answered and they are agreeable to proceed with surgery.  Her surgery is scheduled for a nondialysis day on 09/14/2017.   [name] [facility]  [phone]

## 2017-09-02 NOTE — Progress Notes (Signed)
Vitals:   09/02/17 1054  BP: (!) 159/89  Pulse: 81  Resp: 14  Temp: 99 F (37.2 C)  SpO2: 94%  Weight: 120 lb (54.4 kg)  Height: 5\' 3"  (1.6 m)

## 2017-09-02 NOTE — H&P (View-Only) (Signed)
Patient name: [name] MRN: [id number] DOB: [birth year] Sex: female   REASON FOR CONSULT:    To evaluate for hemodialysis access.  The consult is requested by Juanell Fairly, NP  HPI:   [name] is a pleasant 42 y.o. female, who presents for new hemodialysis access.  She dialyzes on Tuesdays Thursdays and Saturdays.  She has a left upper arm loop AV graft which I most recently revised a year ago in January 2018.  I felt that there would be no further revisions possible surgically on this graft.  The flows have dropped and we have been asked to place access in the right arm while they continue to use her graft in the left arm.  In this way we might be able to avoid a catheter.  She denies any recent uremic symptoms.  Specifically, she denies nausea, vomiting, fatigue, anorexia, or palpitations.  Past Medical History:  Diagnosis Date  . Anemia   . Arthritis   . Diabetes mellitus   . Diabetic retinopathy ([place])    Hx of laser Rx's  . DM type 1 (diabetes mellitus, type 1) ([place]) 09/12/2014   Age of onset for DM type 1 was age 43.     Marland Kitchen ESRD on hemodialysis ([place])    ESRD due to DM type I, age of onset DM 1 was age 73.  Went on dialysis in March 2012.  Gets HD now at [facility] on a TTS schedule.    Marland Kitchen ESRD on hemodialysis ([place]) 09/12/2014   ESRD due to DM type 1.  Started HD in 2012 at [facility].  Gets HD there on a TTS schedule.    Marland Kitchen GERD (gastroesophageal reflux disease)   . History of blood transfusion   . Hyperlipidemia   . Hypertension   . Peripheral vascular disease ([place])   . Pneumonia   . Stroke [facility])     Family History  Problem Relation Age of Onset  . Cancer Mother        breast  . Hypertension Father     SOCIAL HISTORY: Social History   Socioeconomic History  . Marital status: Single    Spouse name: Not on file  . Number of children: 1  . Years of education: BACHELOR'S  . Highest education level: Not on file  Social Needs  . Financial resource  strain: Not on file  . Food insecurity - worry: Not on file  . Food insecurity - inability: Not on file  . Transportation needs - medical: Not on file  . Transportation needs - non-medical: Not on file  Occupational History  . Not on file  Tobacco Use  . Smoking status: Never Smoker  . Smokeless tobacco: Never Used  Substance and Sexual Activity  . Alcohol use: No    Alcohol/week: 0.0 oz  . Drug use: No  . Sexual activity: Not on file  Other Topics Concern  . Not on file  Social History Narrative   ** Merged History Encounter **       Patient is single with 1 child. Patient is right handed. Patient has BS degree. Patient drinks 0 caffeine.    Allergies  Allergen Reactions  . Pollen Extract Other (See Comments)    Watery eyes    Current Outpatient Medications  Medication Sig Dispense Refill  . amLODipine (NORVASC) 10 MG tablet Take 10 mg by mouth daily.     Marland Kitchen aspirin 81 MG EC tablet Take 81 mg by mouth  daily. Swallow whole.    . calcium acetate (PHOSLO) 667 MG capsule Take 667 mg by mouth 3 (three) times daily with meals.      . cinacalcet (SENSIPAR) 30 MG tablet Take 30 mg by mouth daily.     . insulin aspart (NOVOLOG) 100 UNIT/ML injection novolog 100 units/ml via Insulin pump    . labetalol (NORMODYNE) 300 MG tablet Take 300 mg by mouth 3 (three) times daily.    . Pediatric Multiple Vit-C-FA (FLINSTONES GUMMIES OMEGA-3 DHA) CHEW Chew 1 tablet by mouth daily.     . rosuvastatin (CRESTOR) 10 MG tablet Take 10 mg by mouth every evening.     . famotidine (PEPCID AC) 10 MG chewable tablet Chew 10 mg by mouth daily as needed for heartburn.    Marland Kitchen GLUCAGON EMERGENCY 1 MG injection Inject 1 mg into the muscle once as needed (for severe low blood sugar).     . insulin aspart (NOVOLOG) 100 UNIT/ML FlexPen Inject 3-10 Units into the skin See admin instructions. Based on sliding scale 1 unit per 20g of carbs    . insulin glargine (LANTUS) 100 UNIT/ML injection Inject 10 Units into  the skin at bedtime.      No current facility-administered medications for this visit.     REVIEW OF SYSTEMS:  [X]  denotes positive finding, [ ]  denotes negative finding Cardiac  Comments:  Chest pain or chest pressure:    Shortness of breath upon exertion:    Short of breath when lying flat:    Irregular heart rhythm:        Vascular    Pain in calf, thigh, or hip brought on by ambulation:    Pain in feet at night that wakes you up from your sleep:     Blood clot in your veins:    Leg swelling:         Pulmonary    Oxygen at home:    Productive cough:     Wheezing:         Neurologic    Sudden weakness in arms or legs:     Sudden numbness in arms or legs:     Sudden onset of difficulty speaking or slurred speech:    Temporary loss of vision in one eye:     Problems with dizziness:         Gastrointestinal    Blood in stool:     Vomited blood:         Genitourinary    Burning when urinating:     Blood in urine:        Psychiatric    Major depression:         Hematologic    Bleeding problems:    Problems with blood clotting too easily:        Skin    Rashes or ulcers:        Constitutional    Fever or chills:     PHYSICAL EXAM:   Vitals:   09/02/17 1054 09/02/17 1057  BP: (!) 159/89 (!) 158/90  Pulse: 81 81  Resp: 14   Temp: 99 F (37.2 C)   SpO2: 94%   Weight: 120 lb (54.4 kg)   Height: 5\' 3"  (1.6 m)     GENERAL: The patient is a well-nourished female, in no acute distress. The vital signs are documented above. CARDIAC: There is a regular rate and rhythm.  VASCULAR: She has palpable brachial and radial pulses bilaterally. Her left upper  arm graft is pulsatile. PULMONARY: There is good air exchange bilaterally without wheezing or rales. ABDOMEN: Soft and non-tender with normal pitched bowel sounds.  MUSCULOSKELETAL: There are no major deformities or cyanosis. NEUROLOGIC: No focal weakness or paresthesias are detected. SKIN: There are no ulcers  or rashes noted. PSYCHIATRIC: The patient has a normal affect.  DATA:    VEIN MAP: I have independently interpreted her vein map today in the right arm.  The forearm and upper arm cephalic vein looked small and are likely not adequate for a fistula.  The basilic vein on the right is marginal in size but but might be adequate for a basilic vein transposition.  ARTERIAL DUPLEX RIGHT UPPER EXTREMITY: I have independently interpreted her duplex of the right upper extremity.  She has triphasic Doppler signals in the radial and ulnar positions on the right.  The brachial artery is 0.49 cm.  Of note she is noted to have a high bifurcation of the brachial artery.  MEDICAL ISSUES:   END-STAGE RENAL DISEASE: I think it would be reasonable to try to place access in the right arm while they continue to use her graft on the left.  The forearm and upper arm cephalic vein did not look adequate for a fistula.  She might potentially be a candidate for a basilic vein transposition.  I have explained that depending on the quality of the vein this might be done in 1 or 2 stages.  If the vein is clearly not adequate then we would likely place an AV graft. I have explained the indications for placement of an AV fistula or AV graft. I've explained that if at all possible we will place an AV fistula.  I have reviewed the risks of placement of an AV fistula including but not limited to: failure of the fistula to mature, need for subsequent interventions, and thrombosis. In addition I have reviewed the potential complications of placement of an AV graft. These risks include, but are not limited to, graft thrombosis, graft infection, wound healing problems, bleeding, arm swelling, and steal syndrome. All the patient's questions were answered and they are agreeable to proceed with surgery.  Her surgery is scheduled for a nondialysis day on 09/14/2017.   [name] [facility]  [phone]

## 2017-09-03 DIAGNOSIS — R739 Hyperglycemia, unspecified: Secondary | ICD-10-CM | POA: Diagnosis not present

## 2017-09-03 DIAGNOSIS — D509 Iron deficiency anemia, unspecified: Secondary | ICD-10-CM | POA: Diagnosis not present

## 2017-09-03 DIAGNOSIS — N2581 Secondary hyperparathyroidism of renal origin: Secondary | ICD-10-CM | POA: Diagnosis not present

## 2017-09-03 DIAGNOSIS — D631 Anemia in chronic kidney disease: Secondary | ICD-10-CM | POA: Diagnosis not present

## 2017-09-03 DIAGNOSIS — N186 End stage renal disease: Secondary | ICD-10-CM | POA: Diagnosis not present

## 2017-09-03 DIAGNOSIS — E1029 Type 1 diabetes mellitus with other diabetic kidney complication: Secondary | ICD-10-CM | POA: Diagnosis not present

## 2017-09-05 DIAGNOSIS — R739 Hyperglycemia, unspecified: Secondary | ICD-10-CM | POA: Diagnosis not present

## 2017-09-05 DIAGNOSIS — D631 Anemia in chronic kidney disease: Secondary | ICD-10-CM | POA: Diagnosis not present

## 2017-09-05 DIAGNOSIS — E1029 Type 1 diabetes mellitus with other diabetic kidney complication: Secondary | ICD-10-CM | POA: Diagnosis not present

## 2017-09-05 DIAGNOSIS — N2581 Secondary hyperparathyroidism of renal origin: Secondary | ICD-10-CM | POA: Diagnosis not present

## 2017-09-05 DIAGNOSIS — D509 Iron deficiency anemia, unspecified: Secondary | ICD-10-CM | POA: Diagnosis not present

## 2017-09-05 DIAGNOSIS — N186 End stage renal disease: Secondary | ICD-10-CM | POA: Diagnosis not present

## 2017-09-08 DIAGNOSIS — E1029 Type 1 diabetes mellitus with other diabetic kidney complication: Secondary | ICD-10-CM | POA: Diagnosis not present

## 2017-09-08 DIAGNOSIS — R739 Hyperglycemia, unspecified: Secondary | ICD-10-CM | POA: Diagnosis not present

## 2017-09-08 DIAGNOSIS — D631 Anemia in chronic kidney disease: Secondary | ICD-10-CM | POA: Diagnosis not present

## 2017-09-08 DIAGNOSIS — N186 End stage renal disease: Secondary | ICD-10-CM | POA: Diagnosis not present

## 2017-09-08 DIAGNOSIS — N2581 Secondary hyperparathyroidism of renal origin: Secondary | ICD-10-CM | POA: Diagnosis not present

## 2017-09-08 DIAGNOSIS — D509 Iron deficiency anemia, unspecified: Secondary | ICD-10-CM | POA: Diagnosis not present

## 2017-09-10 DIAGNOSIS — R739 Hyperglycemia, unspecified: Secondary | ICD-10-CM | POA: Diagnosis not present

## 2017-09-10 DIAGNOSIS — D631 Anemia in chronic kidney disease: Secondary | ICD-10-CM | POA: Diagnosis not present

## 2017-09-10 DIAGNOSIS — N2581 Secondary hyperparathyroidism of renal origin: Secondary | ICD-10-CM | POA: Diagnosis not present

## 2017-09-10 DIAGNOSIS — E1029 Type 1 diabetes mellitus with other diabetic kidney complication: Secondary | ICD-10-CM | POA: Diagnosis not present

## 2017-09-10 DIAGNOSIS — D509 Iron deficiency anemia, unspecified: Secondary | ICD-10-CM | POA: Diagnosis not present

## 2017-09-10 DIAGNOSIS — N186 End stage renal disease: Secondary | ICD-10-CM | POA: Diagnosis not present

## 2017-09-12 DIAGNOSIS — N2581 Secondary hyperparathyroidism of renal origin: Secondary | ICD-10-CM | POA: Diagnosis not present

## 2017-09-12 DIAGNOSIS — R739 Hyperglycemia, unspecified: Secondary | ICD-10-CM | POA: Diagnosis not present

## 2017-09-12 DIAGNOSIS — D509 Iron deficiency anemia, unspecified: Secondary | ICD-10-CM | POA: Diagnosis not present

## 2017-09-12 DIAGNOSIS — N186 End stage renal disease: Secondary | ICD-10-CM | POA: Diagnosis not present

## 2017-09-12 DIAGNOSIS — E1029 Type 1 diabetes mellitus with other diabetic kidney complication: Secondary | ICD-10-CM | POA: Diagnosis not present

## 2017-09-12 DIAGNOSIS — D631 Anemia in chronic kidney disease: Secondary | ICD-10-CM | POA: Diagnosis not present

## 2017-09-15 DIAGNOSIS — D631 Anemia in chronic kidney disease: Secondary | ICD-10-CM | POA: Diagnosis not present

## 2017-09-15 DIAGNOSIS — E1029 Type 1 diabetes mellitus with other diabetic kidney complication: Secondary | ICD-10-CM | POA: Diagnosis not present

## 2017-09-15 DIAGNOSIS — N2581 Secondary hyperparathyroidism of renal origin: Secondary | ICD-10-CM | POA: Diagnosis not present

## 2017-09-15 DIAGNOSIS — N186 End stage renal disease: Secondary | ICD-10-CM | POA: Diagnosis not present

## 2017-09-15 DIAGNOSIS — R739 Hyperglycemia, unspecified: Secondary | ICD-10-CM | POA: Diagnosis not present

## 2017-09-15 DIAGNOSIS — D509 Iron deficiency anemia, unspecified: Secondary | ICD-10-CM | POA: Diagnosis not present

## 2017-09-17 DIAGNOSIS — E1029 Type 1 diabetes mellitus with other diabetic kidney complication: Secondary | ICD-10-CM | POA: Diagnosis not present

## 2017-09-17 DIAGNOSIS — N2581 Secondary hyperparathyroidism of renal origin: Secondary | ICD-10-CM | POA: Diagnosis not present

## 2017-09-17 DIAGNOSIS — R739 Hyperglycemia, unspecified: Secondary | ICD-10-CM | POA: Diagnosis not present

## 2017-09-17 DIAGNOSIS — D631 Anemia in chronic kidney disease: Secondary | ICD-10-CM | POA: Diagnosis not present

## 2017-09-17 DIAGNOSIS — D509 Iron deficiency anemia, unspecified: Secondary | ICD-10-CM | POA: Diagnosis not present

## 2017-09-17 DIAGNOSIS — N186 End stage renal disease: Secondary | ICD-10-CM | POA: Diagnosis not present

## 2017-09-18 DIAGNOSIS — N186 End stage renal disease: Secondary | ICD-10-CM | POA: Diagnosis not present

## 2017-09-18 DIAGNOSIS — E1029 Type 1 diabetes mellitus with other diabetic kidney complication: Secondary | ICD-10-CM | POA: Diagnosis not present

## 2017-09-18 DIAGNOSIS — Z992 Dependence on renal dialysis: Secondary | ICD-10-CM | POA: Diagnosis not present

## 2017-09-19 DIAGNOSIS — N186 End stage renal disease: Secondary | ICD-10-CM | POA: Diagnosis not present

## 2017-09-19 DIAGNOSIS — E1029 Type 1 diabetes mellitus with other diabetic kidney complication: Secondary | ICD-10-CM | POA: Diagnosis not present

## 2017-09-19 DIAGNOSIS — R509 Fever, unspecified: Secondary | ICD-10-CM | POA: Diagnosis not present

## 2017-09-19 DIAGNOSIS — N2581 Secondary hyperparathyroidism of renal origin: Secondary | ICD-10-CM | POA: Diagnosis not present

## 2017-09-19 DIAGNOSIS — D509 Iron deficiency anemia, unspecified: Secondary | ICD-10-CM | POA: Diagnosis not present

## 2017-09-19 DIAGNOSIS — D631 Anemia in chronic kidney disease: Secondary | ICD-10-CM | POA: Diagnosis not present

## 2017-09-19 DIAGNOSIS — R739 Hyperglycemia, unspecified: Secondary | ICD-10-CM | POA: Diagnosis not present

## 2017-09-22 DIAGNOSIS — D509 Iron deficiency anemia, unspecified: Secondary | ICD-10-CM | POA: Diagnosis not present

## 2017-09-22 DIAGNOSIS — N2581 Secondary hyperparathyroidism of renal origin: Secondary | ICD-10-CM | POA: Diagnosis not present

## 2017-09-22 DIAGNOSIS — E1029 Type 1 diabetes mellitus with other diabetic kidney complication: Secondary | ICD-10-CM | POA: Diagnosis not present

## 2017-09-22 DIAGNOSIS — N186 End stage renal disease: Secondary | ICD-10-CM | POA: Diagnosis not present

## 2017-09-22 DIAGNOSIS — R739 Hyperglycemia, unspecified: Secondary | ICD-10-CM | POA: Diagnosis not present

## 2017-09-22 DIAGNOSIS — R509 Fever, unspecified: Secondary | ICD-10-CM | POA: Diagnosis not present

## 2017-09-24 DIAGNOSIS — R509 Fever, unspecified: Secondary | ICD-10-CM | POA: Diagnosis not present

## 2017-09-24 DIAGNOSIS — D509 Iron deficiency anemia, unspecified: Secondary | ICD-10-CM | POA: Diagnosis not present

## 2017-09-24 DIAGNOSIS — N186 End stage renal disease: Secondary | ICD-10-CM | POA: Diagnosis not present

## 2017-09-24 DIAGNOSIS — R739 Hyperglycemia, unspecified: Secondary | ICD-10-CM | POA: Diagnosis not present

## 2017-09-24 DIAGNOSIS — E1029 Type 1 diabetes mellitus with other diabetic kidney complication: Secondary | ICD-10-CM | POA: Diagnosis not present

## 2017-09-24 DIAGNOSIS — N2581 Secondary hyperparathyroidism of renal origin: Secondary | ICD-10-CM | POA: Diagnosis not present

## 2017-09-25 ENCOUNTER — Encounter (HOSPITAL_COMMUNITY): Payer: Self-pay | Admitting: *Deleted

## 2017-09-25 ENCOUNTER — Other Ambulatory Visit: Payer: Self-pay

## 2017-09-25 NOTE — Progress Notes (Signed)
Pt denies SOB, chest pain, and being under the care of a cardiologist. Pt denies having a cardiac cath. Pt denies having an EKG and chest x ray within the last year. Pt made aware to stop taking vitamins, fish oil and herbal medications. Do not take any NSAIDs ie: Ibuprofen, Advil, Naproxen (Aleve), Motrin, BC and Goody Powder. Janine, Diabetes Coordinator made aware of pt surgery. Pt stated that she has been instructed on  how to adjust her insulin pump for surgical procedures. Pt made aware to check BG every 2 hours prior to arrival to hospital on DOS. Pt made aware to treat a BG < 70 with  4 ounces of apple juice, wait 15 minutes after intervention to recheck BG, if BG remains < 70, call Short Stay unit to speak with a nurse. Pt verbalized understanding of all pre-op instructions.

## 2017-09-26 DIAGNOSIS — R739 Hyperglycemia, unspecified: Secondary | ICD-10-CM | POA: Diagnosis not present

## 2017-09-26 DIAGNOSIS — D509 Iron deficiency anemia, unspecified: Secondary | ICD-10-CM | POA: Diagnosis not present

## 2017-09-26 DIAGNOSIS — E1029 Type 1 diabetes mellitus with other diabetic kidney complication: Secondary | ICD-10-CM | POA: Diagnosis not present

## 2017-09-26 DIAGNOSIS — R509 Fever, unspecified: Secondary | ICD-10-CM | POA: Diagnosis not present

## 2017-09-26 DIAGNOSIS — N2581 Secondary hyperparathyroidism of renal origin: Secondary | ICD-10-CM | POA: Diagnosis not present

## 2017-09-26 DIAGNOSIS — N186 End stage renal disease: Secondary | ICD-10-CM | POA: Diagnosis not present

## 2017-09-28 ENCOUNTER — Ambulatory Visit (HOSPITAL_COMMUNITY): Payer: Medicare Other | Admitting: Anesthesiology

## 2017-09-28 ENCOUNTER — Ambulatory Visit (HOSPITAL_COMMUNITY)
Admission: RE | Admit: 2017-09-28 | Discharge: 2017-09-28 | Disposition: A | Discharge status: Home / Self Care | Payer: Medicare Other | Source: Ambulatory Visit | Attending: Vascular Surgery | Admitting: Vascular Surgery

## 2017-09-28 ENCOUNTER — Encounter (HOSPITAL_COMMUNITY)
Admission: RE | Disposition: A | Discharge status: Home / Self Care | Payer: Self-pay | Source: Ambulatory Visit | Attending: Vascular Surgery

## 2017-09-28 ENCOUNTER — Encounter (HOSPITAL_COMMUNITY): Payer: Self-pay | Admitting: Certified Registered Nurse Anesthetist

## 2017-09-28 DIAGNOSIS — K219 Gastro-esophageal reflux disease without esophagitis: Secondary | ICD-10-CM | POA: Insufficient documentation

## 2017-09-28 DIAGNOSIS — E785 Hyperlipidemia, unspecified: Secondary | ICD-10-CM | POA: Diagnosis not present

## 2017-09-28 DIAGNOSIS — Z7982 Long term (current) use of aspirin: Secondary | ICD-10-CM | POA: Insufficient documentation

## 2017-09-28 DIAGNOSIS — N185 Chronic kidney disease, stage 5: Secondary | ICD-10-CM | POA: Diagnosis not present

## 2017-09-28 DIAGNOSIS — Z992 Dependence on renal dialysis: Secondary | ICD-10-CM | POA: Insufficient documentation

## 2017-09-28 DIAGNOSIS — E10319 Type 1 diabetes mellitus with unspecified diabetic retinopathy without macular edema: Secondary | ICD-10-CM | POA: Diagnosis not present

## 2017-09-28 DIAGNOSIS — Z79899 Other long term (current) drug therapy: Secondary | ICD-10-CM | POA: Diagnosis not present

## 2017-09-28 DIAGNOSIS — N186 End stage renal disease: Secondary | ICD-10-CM | POA: Diagnosis not present

## 2017-09-28 DIAGNOSIS — E1022 Type 1 diabetes mellitus with diabetic chronic kidney disease: Secondary | ICD-10-CM | POA: Insufficient documentation

## 2017-09-28 DIAGNOSIS — I120 Hypertensive chronic kidney disease with stage 5 chronic kidney disease or end stage renal disease: Secondary | ICD-10-CM | POA: Insufficient documentation

## 2017-09-28 DIAGNOSIS — E1051 Type 1 diabetes mellitus with diabetic peripheral angiopathy without gangrene: Secondary | ICD-10-CM | POA: Insufficient documentation

## 2017-09-28 DIAGNOSIS — Z794 Long term (current) use of insulin: Secondary | ICD-10-CM | POA: Insufficient documentation

## 2017-09-28 DIAGNOSIS — Z8673 Personal history of transient ischemic attack (TIA), and cerebral infarction without residual deficits: Secondary | ICD-10-CM | POA: Insufficient documentation

## 2017-09-28 HISTORY — PX: BASCILIC VEIN TRANSPOSITION: SHX5742

## 2017-09-28 HISTORY — DX: Nontoxic goiter, unspecified: E04.9

## 2017-09-28 LAB — POCT I-STAT 4, (NA,K, GLUC, HGB,HCT)
GLUCOSE: 127 mg/dL — AB (ref 65–99)
HEMATOCRIT: 31 % — AB (ref 36.0–46.0)
HEMOGLOBIN: 10.5 g/dL — AB (ref 12.0–15.0)
POTASSIUM: 3.6 mmol/L (ref 3.5–5.1)
Sodium: 141 mmol/L (ref 135–145)

## 2017-09-28 LAB — GLUCOSE, CAPILLARY
GLUCOSE-CAPILLARY: 143 mg/dL — AB (ref 65–99)
Glucose-Capillary: 103 mg/dL — ABNORMAL HIGH (ref 65–99)
Glucose-Capillary: 102 mg/dL — ABNORMAL HIGH (ref 65–99)
Glucose-Capillary: 52 mg/dL — ABNORMAL LOW (ref 65–99)
Glucose-Capillary: 135 mg/dL — ABNORMAL HIGH (ref 65–99)

## 2017-09-28 LAB — HCG, SERUM, QUALITATIVE: PREG SERUM: NEGATIVE

## 2017-09-28 SURGERY — TRANSPOSITION, VEIN, BASILIC
Anesthesia: General | Site: Arm Upper | Laterality: Right

## 2017-09-28 MED ORDER — PHENYLEPHRINE HCL 10 MG/ML IJ SOLN
INTRAMUSCULAR | Status: DC | PRN
  Administered 2017-09-28 (×2): 80 ug via INTRAVENOUS

## 2017-09-28 MED ORDER — HEPARIN SODIUM (PORCINE) 1000 UNIT/ML IJ SOLN
INTRAMUSCULAR | Status: DC | PRN
  Administered 2017-09-28: 5000 [IU] via INTRAVENOUS

## 2017-09-28 MED ORDER — 0.9 % SODIUM CHLORIDE (POUR BTL) OPTIME
TOPICAL | Status: DC | PRN
  Administered 2017-09-28: 1000 mL

## 2017-09-28 MED ORDER — INSULIN REGULAR HUMAN 100 UNIT/ML IJ SOLN
INTRAMUSCULAR | Status: DC | PRN
  Administered 2017-09-28: 1.7 [IU]/h via INTRAVENOUS

## 2017-09-28 MED ORDER — INSULIN REGULAR BOLUS VIA INFUSION: 0.0000 [IU] | Freq: Three times a day (TID) | INTRAVENOUS | Status: DC

## 2017-09-28 MED ORDER — SODIUM CHLORIDE 0.9 % IV SOLN: INTRAVENOUS | Status: DC

## 2017-09-28 MED ORDER — DEXTROSE 50 % IV SOLN
INTRAVENOUS | Status: AC
  Filled 2017-09-28: qty 50

## 2017-09-28 MED ORDER — SODIUM CHLORIDE 0.9 % IV SOLN
INTRAVENOUS | Status: DC
  Administered 2017-09-28 (×2): via INTRAVENOUS

## 2017-09-28 MED ORDER — HYDROMORPHONE HCL 1 MG/ML IJ SOLN: 0.2500 mg | INTRAMUSCULAR | Status: DC | PRN

## 2017-09-28 MED ORDER — PROTAMINE SULFATE 10 MG/ML IV SOLN
INTRAVENOUS | Status: DC | PRN
  Administered 2017-09-28: 10 mg via INTRAVENOUS
  Administered 2017-09-28: 20 mg via INTRAVENOUS

## 2017-09-28 MED ORDER — LIDOCAINE HCL (CARDIAC) 20 MG/ML IV SOLN
INTRAVENOUS | Status: AC
  Filled 2017-09-28: qty 5

## 2017-09-28 MED ORDER — CEFAZOLIN SODIUM-DEXTROSE 2-4 GM/100ML-% IV SOLN
2.0000 g | INTRAVENOUS | Status: AC
  Administered 2017-09-28: 2 g via INTRAVENOUS
  Filled 2017-09-28: qty 100

## 2017-09-28 MED ORDER — PROPOFOL 10 MG/ML IV BOLUS
INTRAVENOUS | Status: AC
  Filled 2017-09-28: qty 20

## 2017-09-28 MED ORDER — PAPAVERINE HCL 30 MG/ML IJ SOLN
INTRAMUSCULAR | Status: AC
  Filled 2017-09-28: qty 2

## 2017-09-28 MED ORDER — PROMETHAZINE HCL 25 MG/ML IJ SOLN
INTRAMUSCULAR | Status: AC
  Filled 2017-09-28: qty 1

## 2017-09-28 MED ORDER — OXYCODONE HCL 5 MG PO TABS: 5.0000 mg | ORAL_TABLET | ORAL | 0 refills | Status: DC | PRN

## 2017-09-28 MED ORDER — PAPAVERINE HCL 30 MG/ML IJ SOLN
INTRAMUSCULAR | Status: DC | PRN
  Administered 2017-09-28: 60 mg

## 2017-09-28 MED ORDER — LIDOCAINE HCL (CARDIAC) 20 MG/ML IV SOLN
INTRAVENOUS | Status: DC | PRN
  Administered 2017-09-28: 80 mg via INTRAVENOUS

## 2017-09-28 MED ORDER — HEPARIN SODIUM (PORCINE) 5000 UNIT/ML IJ SOLN
INTRAMUSCULAR | Status: DC | PRN
  Administered 2017-09-28: 09:00:00

## 2017-09-28 MED ORDER — PHENYLEPHRINE 40 MCG/ML (10ML) SYRINGE FOR IV PUSH (FOR BLOOD PRESSURE SUPPORT)
PREFILLED_SYRINGE | INTRAVENOUS | Status: AC
  Filled 2017-09-28: qty 10

## 2017-09-28 MED ORDER — SUCCINYLCHOLINE CHLORIDE 20 MG/ML IJ SOLN
INTRAMUSCULAR | Status: DC | PRN
  Administered 2017-09-28: 100 mg via INTRAVENOUS

## 2017-09-28 MED ORDER — FENTANYL CITRATE (PF) 100 MCG/2ML IJ SOLN
INTRAMUSCULAR | Status: DC | PRN
  Administered 2017-09-28: 50 ug via INTRAVENOUS
  Administered 2017-09-28: 25 ug via INTRAVENOUS

## 2017-09-28 MED ORDER — PROPOFOL 10 MG/ML IV BOLUS
INTRAVENOUS | Status: DC | PRN
  Administered 2017-09-28: 150 mg via INTRAVENOUS

## 2017-09-28 MED ORDER — FENTANYL CITRATE (PF) 250 MCG/5ML IJ SOLN
INTRAMUSCULAR | Status: AC
  Filled 2017-09-28: qty 5

## 2017-09-28 MED ORDER — DEXTROSE-NACL 5-0.45 % IV SOLN
INTRAVENOUS | Status: DC
  Administered 2017-09-28: 10:00:00 via INTRAVENOUS

## 2017-09-28 MED ORDER — DEXTROSE 50 % IV SOLN
25.0000 mL | Freq: Once | INTRAVENOUS | Status: AC
  Administered 2017-09-28: 25 mL via INTRAVENOUS

## 2017-09-28 MED ORDER — LIDOCAINE HCL 1 % IJ SOLN
INTRAMUSCULAR | Status: DC | PRN
  Administered 2017-09-28: 30 mL

## 2017-09-28 MED ORDER — SODIUM CHLORIDE 0.9 % IV SOLN
INTRAVENOUS | Status: DC
  Filled 2017-09-28: qty 1

## 2017-09-28 MED ORDER — LIDOCAINE HCL (PF) 1 % IJ SOLN
INTRAMUSCULAR | Status: AC
  Filled 2017-09-28: qty 30

## 2017-09-28 MED ORDER — PROMETHAZINE HCL 25 MG/ML IJ SOLN
6.2500 mg | INTRAMUSCULAR | Status: DC | PRN
  Administered 2017-09-28: 6.25 mg via INTRAVENOUS

## 2017-09-28 MED ORDER — ONDANSETRON HCL 4 MG/2ML IJ SOLN
INTRAMUSCULAR | Status: AC
  Filled 2017-09-28: qty 2

## 2017-09-28 MED ORDER — DEXTROSE 50 % IV SOLN: 25.0000 mL | INTRAVENOUS | Status: DC | PRN

## 2017-09-28 MED ORDER — ONDANSETRON HCL 4 MG/2ML IJ SOLN
INTRAMUSCULAR | Status: DC | PRN
  Administered 2017-09-28: 4 mg via INTRAVENOUS

## 2017-09-28 SURGICAL SUPPLY — 39 items
ARMBAND PINK RESTRICT EXTREMIT (MISCELLANEOUS) ×3 IMPLANT
CANISTER SUCT 3000ML PPV (MISCELLANEOUS) ×3 IMPLANT
CANNULA VESSEL 3MM 2 BLNT TIP (CANNULA) ×6 IMPLANT
CLIP VESOCCLUDE MED 24/CT (CLIP) ×3 IMPLANT
CLIP VESOCCLUDE SM WIDE 24/CT (CLIP) ×3 IMPLANT
COVER PROBE W GEL 5X96 (DRAPES) IMPLANT
DECANTER SPIKE VIAL GLASS SM (MISCELLANEOUS) ×3 IMPLANT
DERMABOND ADVANCED (GAUZE/BANDAGES/DRESSINGS) ×2
DERMABOND ADVANCED .7 DNX12 (GAUZE/BANDAGES/DRESSINGS) ×1 IMPLANT
ELECT REM PT RETURN 9FT ADLT (ELECTROSURGICAL) ×3
ELECTRODE REM PT RTRN 9FT ADLT (ELECTROSURGICAL) ×1 IMPLANT
GLOVE BIO SURGEON STRL SZ 6.5 (GLOVE) ×2 IMPLANT
GLOVE BIO SURGEON STRL SZ7.5 (GLOVE) ×3 IMPLANT
GLOVE BIO SURGEONS STRL SZ 6.5 (GLOVE) ×1
GLOVE BIOGEL PI IND STRL 7.0 (GLOVE) ×1 IMPLANT
GLOVE BIOGEL PI IND STRL 7.5 (GLOVE) ×1 IMPLANT
GLOVE BIOGEL PI IND STRL 8 (GLOVE) ×1 IMPLANT
GLOVE BIOGEL PI INDICATOR 7.0 (GLOVE) ×2
GLOVE BIOGEL PI INDICATOR 7.5 (GLOVE) ×2
GLOVE BIOGEL PI INDICATOR 8 (GLOVE) ×2
GLOVE ECLIPSE 7.0 STRL STRAW (GLOVE) ×3 IMPLANT
GOWN STRL REUS W/ TWL LRG LVL3 (GOWN DISPOSABLE) ×3 IMPLANT
GOWN STRL REUS W/TWL LRG LVL3 (GOWN DISPOSABLE) ×6
KIT BASIN OR (CUSTOM PROCEDURE TRAY) ×3 IMPLANT
KIT ROOM TURNOVER OR (KITS) ×3 IMPLANT
NEEDLE 18GX1X1/2 (RX/OR ONLY) (NEEDLE) ×3 IMPLANT
NS IRRIG 1000ML POUR BTL (IV SOLUTION) ×3 IMPLANT
PACK CV ACCESS (CUSTOM PROCEDURE TRAY) ×3 IMPLANT
PAD ARMBOARD 7.5X6 YLW CONV (MISCELLANEOUS) ×6 IMPLANT
SPONGE SURGIFOAM ABS GEL 100 (HEMOSTASIS) IMPLANT
SUT PROLENE 6 0 BV (SUTURE) ×3 IMPLANT
SUT SILK 2 0 SH (SUTURE) IMPLANT
SUT VIC AB 3-0 SH 27 (SUTURE) ×2
SUT VIC AB 3-0 SH 27X BRD (SUTURE) ×1 IMPLANT
SUT VICRYL 4-0 PS2 18IN ABS (SUTURE) ×3 IMPLANT
SYR CONTROL 10ML LL (SYRINGE) ×3 IMPLANT
TOWEL GREEN STERILE (TOWEL DISPOSABLE) ×3 IMPLANT
UNDERPAD 30X30 (UNDERPADS AND DIAPERS) ×3 IMPLANT
WATER STERILE IRR 1000ML POUR (IV SOLUTION) ×3 IMPLANT

## 2017-09-28 NOTE — Progress Notes (Signed)
Spoke with Diabetes Coordinator Adah Perl to verify protocol for discontinuing insulin drip/ replacement of patients insulin pump for discharge. Per Sonia Baller, patient can resume her insulin pump when alert, and 9min-1hour after turning it back on, protocol is to discontinue our insulin drip. Will proceed when patient appropriate.

## 2017-09-28 NOTE — Op Note (Signed)
    NAME: ORVILLA TRUETT    MRN: [id number] DOB: 01-01-1976    DATE OF OPERATION: 09/28/2017  PREOP DIAGNOSIS:    End-stage renal disease  POSTOP DIAGNOSIS:    Same  PROCEDURE:    Right first stage basilic vein transposition  SURGEON: Judeth Cornfield. Scot Dock, MD, FACS  ASSIST: Gerri Lins PA  ANESTHESIA: General  EBL: Minimal  INDICATIONS:    [name] is a 42 y.o. female who presents for new access.  FINDINGS:   3 mm basilic vein.  2.5 mm radial artery.  Patient has a high bifurcation of the brachial artery  TECHNIQUE:   The patient was taken to the operating room and received a general anesthetic.  I interrogated the basilic vein with the SonoSite and it appeared to be reasonable in size.  The right arm was prepped and draped in usual sterile fashion.  A longitudinal incision was made over the basilic vein and it spasmed easily.  I did use papaverine.  The vein was about 3 mm.  I dissected a fairly long length distally.  The radial artery was dissected free beneath the fascia.  Given the small size of the artery and vein I thought that this should be done in 2 stages.  The patient was heparinized.  The vein was ligated distally and irrigated up with heparinized saline.  The radial artery was clamped proximally and distally and a longitudinal arteriotomy was made.  The heel of the anastomosis was distally to allow for a gentle curve.  The purpose of this is so that when she returns for the second stage, there will be enough redundant vein to create a wider tunnel.  The anastomosis will then be reversed such that the heel is proximal.  The anastomosis was completed with 6-0 Prolene suture.  There was a good thrill in the vein and a good radial and ulnar signal with the Doppler.  Hemostasis was obtained in the wound and the heparin was partially reversed with protamine.  The wound was closed with a deep layer of 3-0 Vicryl and the skin closed with 4-0 Vicryl.  Dermabond  was applied.  The patient tolerated the procedure well was transferred to the recovery room in stable condition.  All needle and sponge counts were correct.  [name], MD, FACS [facility]  DATE OF DICTATION:   09/28/2017

## 2017-09-28 NOTE — Anesthesia Procedure Notes (Signed)
Procedure Name: Intubation Date/Time: 09/28/2017 9:45 AM Performed by: Inda Coke, CRNA Pre-anesthesia Checklist: Patient identified, Emergency Drugs available, Suction available and Patient being monitored Patient Re-evaluated:Patient Re-evaluated prior to induction Oxygen Delivery Method: Circle System Utilized Preoxygenation: Pre-oxygenation with 100% oxygen Induction Type: IV induction Ventilation: Mask ventilation without difficulty Laryngoscope Size: Mac and 3 Grade View: Grade I Tube type: Oral Number of attempts: 1 Airway Equipment and Method: Stylet and Oral airway Placement Confirmation: ETT inserted through vocal cords under direct vision,  positive ETCO2 and breath sounds checked- equal and bilateral Secured at: 21 cm Tube secured with: Tape Dental Injury: Teeth and Oropharynx as per pre-operative assessment

## 2017-09-28 NOTE — Progress Notes (Signed)
Pt attempted to eat carb snack before discharge to home.Patient still very sleepy and unable to complete one cracker before falling back asleep and then having an pisode of emesis. 6.25mg  IV Phenergan given to help with nausea and vomiting. Patient now sleeping and unable to consume carb snack. CBG at 1300 52. Insulin drip turned off and 1/2ampule of dextrose 50% given per Dr Orene Desanctis verbal order. Will continue to monitor CBG hourly and attempt to get patient discharged when appropriate and safe.

## 2017-09-28 NOTE — Progress Notes (Signed)
Followed up with diabetes coordinator after learning patient does not have all parts to reconnect insulin pump. Current CBG at 1210 is 103. Plan is to provide patient with carbohydrate snack of 3 graham crackers with peanut butter  And recheck CBG before discharge. Adah Perl states that she would not recommend SS Novolog coverage at this time, but will verify with MD before discharge. Will turn off insulin drip immediately prior to patient discharge. Patient educated and nurse reemphasized importance of getting home ASAP and replacing proper part needed to reconnect her own insulin pump.

## 2017-09-28 NOTE — Transfer of Care (Signed)
Immediate Anesthesia Transfer of Care Note  Patient: [name]  Procedure(s) Performed: FIRST STAGE BASILIC VEIN TRANSPOSITION (Right Arm Upper)  Patient Location: PACU  Anesthesia Type:General  Level of Consciousness: awake  Airway & Oxygen Therapy: Patient Spontanous Breathing and Patient connected to nasal cannula oxygen  Post-op Assessment: Report given to RN, Post -op Vital signs reviewed and stable and Patient moving all extremities X 4  Post vital signs: Reviewed and stable  Last Vitals:  Vitals:   09/28/17 0746 09/28/17 1126  BP: (!) 149/75 130/66  Pulse: 79 77  Resp: 16 14  Temp: 36.9 C 36.4 C  SpO2: 100% 100%    Last Pain:  Vitals:   09/28/17 1126  TempSrc:   PainSc: Asleep      Patients Stated Pain Goal: 3 (40/10/27 2536)  Complications: No apparent anesthesia complications

## 2017-09-28 NOTE — Progress Notes (Addendum)
Call received from RN. Patient is out of surgery and going home.  She is currently on insulin drip.  She has insulin pump and supplies at bedside.  Advised that patient restart insulin pump and overlap with insulin drip by 30 minutes to an hour. Last blood sugar 152 mg/dL.    Thanks,  Adah Perl, RN, BC-ADM Inpatient Diabetes Coordinator Pager [phone] (8a-5p)  Addendum 12:00- note that patient's blood sugar is 103 mg/dL.  She does not have extra supplies and thus will not be able to start insulin pump prior to d/c however she can reapply as soon as she gets home (15-30 minutes).  She is getting ready to eat graham crackers. Consider continuing insulin drip until patient ready to leave and then have her re-apply insulin pump as soon as she gets home. Discussed with RN.  She states that she will call MD to discuss.

## 2017-09-28 NOTE — Interval H&P Note (Signed)
History and Physical Interval Note:  09/28/2017 8:54 AM  [name]  has presented today for surgery, with the diagnosis of end stage renal disease  The various methods of treatment have been discussed with the patient and family. After consideration of risks, benefits and other options for treatment, the patient has consented to  Procedure(s): BASILIC VEIN TRANSPOSITION VERSUS GRAFT INSERTION (Right) as a surgical intervention .  The patient's history has been reviewed, patient examined, no change in status, stable for surgery.  I have reviewed the patient's chart and labs.  Questions were answered to the patient's satisfaction.     [name]

## 2017-09-28 NOTE — Progress Notes (Signed)
Late entry-  Patient stated when she was cleansing off her insulin pump site got deactivated.  Spoke to [facility] diabetes coordinator who brought a new Banker.  Patient was unable to use, it did not fit her pump.  Per Larene Beach we should start insulin drip+ patient should be given SQ injection prior to leaving hospital.  Insulin drip was sent back with patient and cRNa was going to start in OR.

## 2017-09-28 NOTE — Anesthesia Preprocedure Evaluation (Addendum)
Anesthesia Evaluation  Patient identified by MRN, date of birth, ID band Patient awake    Reviewed: Allergy & Precautions, NPO status , Patient's Chart, lab work & pertinent test results  Airway Mallampati: II   Neck ROM: Full    Dental no notable dental hx. (+) Teeth Intact   Pulmonary neg pulmonary ROS,    breath sounds clear to auscultation       Cardiovascular hypertension, + Peripheral Vascular Disease   Rhythm:Regular Rate:Normal + Systolic murmurs Her abn EKG is similar to previous EKGs, Denies chest pain   Neuro/Psych CVA    GI/Hepatic GERD  ,Hx of gastroparesis   Endo/Other  diabetes  Renal/GU Renal disease     Musculoskeletal   Abdominal   Peds  Hematology  (+) anemia ,   Anesthesia Other Findings   Reproductive/Obstetrics                          Anesthesia Physical Anesthesia Plan  ASA: III  Anesthesia Plan: General   Post-op Pain Management:    Induction: Intravenous  PONV Risk Score and Plan: 4 or greater and Dexamethasone, Ondansetron and Treatment may vary due to age or medical condition  Airway Management Planned: Oral ETT  Additional Equipment:   Intra-op Plan:   Post-operative Plan: Extubation in OR  Informed Consent: I have reviewed the patients History and Physical, chart, labs and discussed the procedure including the risks, benefits and alternatives for the proposed anesthesia with the patient or authorized representative who has indicated his/her understanding and acceptance.   Dental advisory given  Plan Discussed with: CRNA  Anesthesia Plan Comments:        Anesthesia Quick Evaluation

## 2017-09-28 NOTE — Anesthesia Postprocedure Evaluation (Signed)
Anesthesia Post Note  Patient: [name]  Procedure(s) Performed: FIRST STAGE BASILIC VEIN TRANSPOSITION (Right Arm Upper)     Patient location during evaluation: PACU Anesthesia Type: General Level of consciousness: awake and sedated Pain management: pain level controlled Vital Signs Assessment: post-procedure vital signs reviewed and stable Respiratory status: spontaneous breathing, nonlabored ventilation, respiratory function stable and patient connected to nasal cannula oxygen Cardiovascular status: blood pressure returned to baseline and stable Postop Assessment: no apparent nausea or vomiting Anesthetic complications: no    Last Vitals:  Vitals:   09/28/17 1311 09/28/17 1320  BP: (!) 147/70 (!) 147/75  Pulse: 77 79  Resp: 10 15  Temp:  36.7 C  SpO2: 100% 100%    Last Pain:  Vitals:   09/28/17 1320  TempSrc:   PainSc: 0-No pain                 Azarias Chiou,JAMES TERRILL

## 2017-09-28 NOTE — Progress Notes (Signed)
Patient tolerated carb snack of 23g pretzel and 16g Mikey Kirschner. CBG checked prior to discharge at 1359- 135. Patient educated on importance of getting home and immediately resuming her insulin pump. Teach back method used, family aware.

## 2017-09-28 NOTE — Progress Notes (Signed)
Hypoglycemic Event  CBG: 52  Treatment: D50 IV 25 mL  Symptoms: None  Follow-up CBG: Time:1315 CBG Result:102  Possible Reasons for Event: Inadequate meal intake  Comments/MD notified    Sonia Bromell P

## 2017-09-29 ENCOUNTER — Telehealth: Payer: Self-pay | Admitting: Vascular Surgery

## 2017-09-29 ENCOUNTER — Encounter (HOSPITAL_COMMUNITY): Payer: Self-pay | Admitting: Vascular Surgery

## 2017-09-29 DIAGNOSIS — E1029 Type 1 diabetes mellitus with other diabetic kidney complication: Secondary | ICD-10-CM | POA: Diagnosis not present

## 2017-09-29 DIAGNOSIS — R509 Fever, unspecified: Secondary | ICD-10-CM | POA: Diagnosis not present

## 2017-09-29 DIAGNOSIS — D509 Iron deficiency anemia, unspecified: Secondary | ICD-10-CM | POA: Diagnosis not present

## 2017-09-29 DIAGNOSIS — R739 Hyperglycemia, unspecified: Secondary | ICD-10-CM | POA: Diagnosis not present

## 2017-09-29 DIAGNOSIS — N186 End stage renal disease: Secondary | ICD-10-CM | POA: Diagnosis not present

## 2017-09-29 DIAGNOSIS — N2581 Secondary hyperparathyroidism of renal origin: Secondary | ICD-10-CM | POA: Diagnosis not present

## 2017-09-29 NOTE — Telephone Encounter (Signed)
Left pt vm re 11/18/17 appt. Mailed letter.  09/29/17  LS

## 2017-09-29 NOTE — Telephone Encounter (Signed)
-----   Message from Mena Goes, RN sent at 09/28/2017 11:47 AM EDT ----- Regarding: 6 weeks with duplex   ----- Message ----- From: Angelia Mould, MD Sent: 09/28/2017  11:09 AM To: Vvs Charge Pool Subject: charge and f/u                                  PROCEDURE:   Right first stage basilic vein transposition  SURGEON: Judeth Cornfield. Scot Dock, MD, FACS  ASSIST: Gerri Lins PA  She will need a follow-up visit in 6 weeks with a duplex at that time.  Thank you. CD

## 2017-10-01 DIAGNOSIS — D509 Iron deficiency anemia, unspecified: Secondary | ICD-10-CM | POA: Diagnosis not present

## 2017-10-01 DIAGNOSIS — R739 Hyperglycemia, unspecified: Secondary | ICD-10-CM | POA: Diagnosis not present

## 2017-10-01 DIAGNOSIS — E1029 Type 1 diabetes mellitus with other diabetic kidney complication: Secondary | ICD-10-CM | POA: Diagnosis not present

## 2017-10-01 DIAGNOSIS — N186 End stage renal disease: Secondary | ICD-10-CM | POA: Diagnosis not present

## 2017-10-01 DIAGNOSIS — N2581 Secondary hyperparathyroidism of renal origin: Secondary | ICD-10-CM | POA: Diagnosis not present

## 2017-10-01 DIAGNOSIS — R509 Fever, unspecified: Secondary | ICD-10-CM | POA: Diagnosis not present

## 2017-10-03 DIAGNOSIS — R739 Hyperglycemia, unspecified: Secondary | ICD-10-CM | POA: Diagnosis not present

## 2017-10-03 DIAGNOSIS — E1029 Type 1 diabetes mellitus with other diabetic kidney complication: Secondary | ICD-10-CM | POA: Diagnosis not present

## 2017-10-03 DIAGNOSIS — R509 Fever, unspecified: Secondary | ICD-10-CM | POA: Diagnosis not present

## 2017-10-03 DIAGNOSIS — N186 End stage renal disease: Secondary | ICD-10-CM | POA: Diagnosis not present

## 2017-10-03 DIAGNOSIS — D509 Iron deficiency anemia, unspecified: Secondary | ICD-10-CM | POA: Diagnosis not present

## 2017-10-03 DIAGNOSIS — N2581 Secondary hyperparathyroidism of renal origin: Secondary | ICD-10-CM | POA: Diagnosis not present

## 2017-10-06 DIAGNOSIS — N2581 Secondary hyperparathyroidism of renal origin: Secondary | ICD-10-CM | POA: Diagnosis not present

## 2017-10-06 DIAGNOSIS — N186 End stage renal disease: Secondary | ICD-10-CM | POA: Diagnosis not present

## 2017-10-06 DIAGNOSIS — E1029 Type 1 diabetes mellitus with other diabetic kidney complication: Secondary | ICD-10-CM | POA: Diagnosis not present

## 2017-10-06 DIAGNOSIS — R739 Hyperglycemia, unspecified: Secondary | ICD-10-CM | POA: Diagnosis not present

## 2017-10-06 DIAGNOSIS — R509 Fever, unspecified: Secondary | ICD-10-CM | POA: Diagnosis not present

## 2017-10-06 DIAGNOSIS — D509 Iron deficiency anemia, unspecified: Secondary | ICD-10-CM | POA: Diagnosis not present

## 2017-10-08 DIAGNOSIS — N186 End stage renal disease: Secondary | ICD-10-CM | POA: Diagnosis not present

## 2017-10-08 DIAGNOSIS — E1029 Type 1 diabetes mellitus with other diabetic kidney complication: Secondary | ICD-10-CM | POA: Diagnosis not present

## 2017-10-08 DIAGNOSIS — R739 Hyperglycemia, unspecified: Secondary | ICD-10-CM | POA: Diagnosis not present

## 2017-10-08 DIAGNOSIS — N2581 Secondary hyperparathyroidism of renal origin: Secondary | ICD-10-CM | POA: Diagnosis not present

## 2017-10-08 DIAGNOSIS — D509 Iron deficiency anemia, unspecified: Secondary | ICD-10-CM | POA: Diagnosis not present

## 2017-10-08 DIAGNOSIS — R509 Fever, unspecified: Secondary | ICD-10-CM | POA: Diagnosis not present

## 2017-10-10 DIAGNOSIS — N186 End stage renal disease: Secondary | ICD-10-CM | POA: Diagnosis not present

## 2017-10-10 DIAGNOSIS — R739 Hyperglycemia, unspecified: Secondary | ICD-10-CM | POA: Diagnosis not present

## 2017-10-10 DIAGNOSIS — N2581 Secondary hyperparathyroidism of renal origin: Secondary | ICD-10-CM | POA: Diagnosis not present

## 2017-10-10 DIAGNOSIS — D509 Iron deficiency anemia, unspecified: Secondary | ICD-10-CM | POA: Diagnosis not present

## 2017-10-10 DIAGNOSIS — E1029 Type 1 diabetes mellitus with other diabetic kidney complication: Secondary | ICD-10-CM | POA: Diagnosis not present

## 2017-10-10 DIAGNOSIS — R509 Fever, unspecified: Secondary | ICD-10-CM | POA: Diagnosis not present

## 2017-10-13 DIAGNOSIS — N186 End stage renal disease: Secondary | ICD-10-CM | POA: Diagnosis not present

## 2017-10-13 DIAGNOSIS — N2581 Secondary hyperparathyroidism of renal origin: Secondary | ICD-10-CM | POA: Diagnosis not present

## 2017-10-13 DIAGNOSIS — R509 Fever, unspecified: Secondary | ICD-10-CM | POA: Diagnosis not present

## 2017-10-13 DIAGNOSIS — D509 Iron deficiency anemia, unspecified: Secondary | ICD-10-CM | POA: Diagnosis not present

## 2017-10-13 DIAGNOSIS — R739 Hyperglycemia, unspecified: Secondary | ICD-10-CM | POA: Diagnosis not present

## 2017-10-13 DIAGNOSIS — E1029 Type 1 diabetes mellitus with other diabetic kidney complication: Secondary | ICD-10-CM | POA: Diagnosis not present

## 2017-10-15 DIAGNOSIS — R739 Hyperglycemia, unspecified: Secondary | ICD-10-CM | POA: Diagnosis not present

## 2017-10-15 DIAGNOSIS — N186 End stage renal disease: Secondary | ICD-10-CM | POA: Diagnosis not present

## 2017-10-15 DIAGNOSIS — D509 Iron deficiency anemia, unspecified: Secondary | ICD-10-CM | POA: Diagnosis not present

## 2017-10-15 DIAGNOSIS — N2581 Secondary hyperparathyroidism of renal origin: Secondary | ICD-10-CM | POA: Diagnosis not present

## 2017-10-15 DIAGNOSIS — R509 Fever, unspecified: Secondary | ICD-10-CM | POA: Diagnosis not present

## 2017-10-15 DIAGNOSIS — E1029 Type 1 diabetes mellitus with other diabetic kidney complication: Secondary | ICD-10-CM | POA: Diagnosis not present

## 2017-10-17 DIAGNOSIS — D509 Iron deficiency anemia, unspecified: Secondary | ICD-10-CM | POA: Diagnosis not present

## 2017-10-17 DIAGNOSIS — R509 Fever, unspecified: Secondary | ICD-10-CM | POA: Diagnosis not present

## 2017-10-17 DIAGNOSIS — R739 Hyperglycemia, unspecified: Secondary | ICD-10-CM | POA: Diagnosis not present

## 2017-10-17 DIAGNOSIS — N2581 Secondary hyperparathyroidism of renal origin: Secondary | ICD-10-CM | POA: Diagnosis not present

## 2017-10-17 DIAGNOSIS — E1029 Type 1 diabetes mellitus with other diabetic kidney complication: Secondary | ICD-10-CM | POA: Diagnosis not present

## 2017-10-17 DIAGNOSIS — N186 End stage renal disease: Secondary | ICD-10-CM | POA: Diagnosis not present

## 2017-10-19 DIAGNOSIS — E1029 Type 1 diabetes mellitus with other diabetic kidney complication: Secondary | ICD-10-CM | POA: Diagnosis not present

## 2017-10-19 DIAGNOSIS — Z992 Dependence on renal dialysis: Secondary | ICD-10-CM | POA: Diagnosis not present

## 2017-10-19 DIAGNOSIS — N186 End stage renal disease: Secondary | ICD-10-CM | POA: Diagnosis not present

## 2017-10-20 DIAGNOSIS — D509 Iron deficiency anemia, unspecified: Secondary | ICD-10-CM | POA: Diagnosis not present

## 2017-10-20 DIAGNOSIS — D631 Anemia in chronic kidney disease: Secondary | ICD-10-CM | POA: Diagnosis not present

## 2017-10-20 DIAGNOSIS — N186 End stage renal disease: Secondary | ICD-10-CM | POA: Diagnosis not present

## 2017-10-20 DIAGNOSIS — N2581 Secondary hyperparathyroidism of renal origin: Secondary | ICD-10-CM | POA: Diagnosis not present

## 2017-10-20 DIAGNOSIS — E1029 Type 1 diabetes mellitus with other diabetic kidney complication: Secondary | ICD-10-CM | POA: Diagnosis not present

## 2017-10-20 DIAGNOSIS — R739 Hyperglycemia, unspecified: Secondary | ICD-10-CM | POA: Diagnosis not present

## 2017-10-22 DIAGNOSIS — E1029 Type 1 diabetes mellitus with other diabetic kidney complication: Secondary | ICD-10-CM | POA: Diagnosis not present

## 2017-10-22 DIAGNOSIS — D509 Iron deficiency anemia, unspecified: Secondary | ICD-10-CM | POA: Diagnosis not present

## 2017-10-22 DIAGNOSIS — R739 Hyperglycemia, unspecified: Secondary | ICD-10-CM | POA: Diagnosis not present

## 2017-10-22 DIAGNOSIS — N186 End stage renal disease: Secondary | ICD-10-CM | POA: Diagnosis not present

## 2017-10-22 DIAGNOSIS — N2581 Secondary hyperparathyroidism of renal origin: Secondary | ICD-10-CM | POA: Diagnosis not present

## 2017-10-22 DIAGNOSIS — D631 Anemia in chronic kidney disease: Secondary | ICD-10-CM | POA: Diagnosis not present

## 2017-10-24 DIAGNOSIS — D631 Anemia in chronic kidney disease: Secondary | ICD-10-CM | POA: Diagnosis not present

## 2017-10-24 DIAGNOSIS — R739 Hyperglycemia, unspecified: Secondary | ICD-10-CM | POA: Diagnosis not present

## 2017-10-24 DIAGNOSIS — N186 End stage renal disease: Secondary | ICD-10-CM | POA: Diagnosis not present

## 2017-10-24 DIAGNOSIS — E1029 Type 1 diabetes mellitus with other diabetic kidney complication: Secondary | ICD-10-CM | POA: Diagnosis not present

## 2017-10-24 DIAGNOSIS — D509 Iron deficiency anemia, unspecified: Secondary | ICD-10-CM | POA: Diagnosis not present

## 2017-10-24 DIAGNOSIS — N2581 Secondary hyperparathyroidism of renal origin: Secondary | ICD-10-CM | POA: Diagnosis not present

## 2017-10-27 DIAGNOSIS — E1029 Type 1 diabetes mellitus with other diabetic kidney complication: Secondary | ICD-10-CM | POA: Diagnosis not present

## 2017-10-27 DIAGNOSIS — D631 Anemia in chronic kidney disease: Secondary | ICD-10-CM | POA: Diagnosis not present

## 2017-10-27 DIAGNOSIS — N186 End stage renal disease: Secondary | ICD-10-CM | POA: Diagnosis not present

## 2017-10-27 DIAGNOSIS — N2581 Secondary hyperparathyroidism of renal origin: Secondary | ICD-10-CM | POA: Diagnosis not present

## 2017-10-27 DIAGNOSIS — D509 Iron deficiency anemia, unspecified: Secondary | ICD-10-CM | POA: Diagnosis not present

## 2017-10-27 DIAGNOSIS — R739 Hyperglycemia, unspecified: Secondary | ICD-10-CM | POA: Diagnosis not present

## 2017-10-29 DIAGNOSIS — E1029 Type 1 diabetes mellitus with other diabetic kidney complication: Secondary | ICD-10-CM | POA: Diagnosis not present

## 2017-10-29 DIAGNOSIS — D509 Iron deficiency anemia, unspecified: Secondary | ICD-10-CM | POA: Diagnosis not present

## 2017-10-29 DIAGNOSIS — N2581 Secondary hyperparathyroidism of renal origin: Secondary | ICD-10-CM | POA: Diagnosis not present

## 2017-10-29 DIAGNOSIS — N186 End stage renal disease: Secondary | ICD-10-CM | POA: Diagnosis not present

## 2017-10-29 DIAGNOSIS — R739 Hyperglycemia, unspecified: Secondary | ICD-10-CM | POA: Diagnosis not present

## 2017-10-29 DIAGNOSIS — D631 Anemia in chronic kidney disease: Secondary | ICD-10-CM | POA: Diagnosis not present

## 2017-10-31 DIAGNOSIS — D631 Anemia in chronic kidney disease: Secondary | ICD-10-CM | POA: Diagnosis not present

## 2017-10-31 DIAGNOSIS — E1029 Type 1 diabetes mellitus with other diabetic kidney complication: Secondary | ICD-10-CM | POA: Diagnosis not present

## 2017-10-31 DIAGNOSIS — R739 Hyperglycemia, unspecified: Secondary | ICD-10-CM | POA: Diagnosis not present

## 2017-10-31 DIAGNOSIS — N186 End stage renal disease: Secondary | ICD-10-CM | POA: Diagnosis not present

## 2017-10-31 DIAGNOSIS — D509 Iron deficiency anemia, unspecified: Secondary | ICD-10-CM | POA: Diagnosis not present

## 2017-10-31 DIAGNOSIS — N2581 Secondary hyperparathyroidism of renal origin: Secondary | ICD-10-CM | POA: Diagnosis not present

## 2017-11-02 ENCOUNTER — Other Ambulatory Visit: Payer: Self-pay

## 2017-11-02 DIAGNOSIS — N186 End stage renal disease: Secondary | ICD-10-CM

## 2017-11-02 DIAGNOSIS — Z992 Dependence on renal dialysis: Principal | ICD-10-CM

## 2017-11-03 DIAGNOSIS — D509 Iron deficiency anemia, unspecified: Secondary | ICD-10-CM | POA: Diagnosis not present

## 2017-11-03 DIAGNOSIS — D631 Anemia in chronic kidney disease: Secondary | ICD-10-CM | POA: Diagnosis not present

## 2017-11-03 DIAGNOSIS — E1029 Type 1 diabetes mellitus with other diabetic kidney complication: Secondary | ICD-10-CM | POA: Diagnosis not present

## 2017-11-03 DIAGNOSIS — R739 Hyperglycemia, unspecified: Secondary | ICD-10-CM | POA: Diagnosis not present

## 2017-11-03 DIAGNOSIS — N2581 Secondary hyperparathyroidism of renal origin: Secondary | ICD-10-CM | POA: Diagnosis not present

## 2017-11-03 DIAGNOSIS — N186 End stage renal disease: Secondary | ICD-10-CM | POA: Diagnosis not present

## 2017-11-05 DIAGNOSIS — R739 Hyperglycemia, unspecified: Secondary | ICD-10-CM | POA: Diagnosis not present

## 2017-11-05 DIAGNOSIS — N186 End stage renal disease: Secondary | ICD-10-CM | POA: Diagnosis not present

## 2017-11-05 DIAGNOSIS — N2581 Secondary hyperparathyroidism of renal origin: Secondary | ICD-10-CM | POA: Diagnosis not present

## 2017-11-05 DIAGNOSIS — E1029 Type 1 diabetes mellitus with other diabetic kidney complication: Secondary | ICD-10-CM | POA: Diagnosis not present

## 2017-11-05 DIAGNOSIS — D631 Anemia in chronic kidney disease: Secondary | ICD-10-CM | POA: Diagnosis not present

## 2017-11-05 DIAGNOSIS — D509 Iron deficiency anemia, unspecified: Secondary | ICD-10-CM | POA: Diagnosis not present

## 2017-11-07 DIAGNOSIS — N2581 Secondary hyperparathyroidism of renal origin: Secondary | ICD-10-CM | POA: Diagnosis not present

## 2017-11-07 DIAGNOSIS — N186 End stage renal disease: Secondary | ICD-10-CM | POA: Diagnosis not present

## 2017-11-07 DIAGNOSIS — D509 Iron deficiency anemia, unspecified: Secondary | ICD-10-CM | POA: Diagnosis not present

## 2017-11-07 DIAGNOSIS — D631 Anemia in chronic kidney disease: Secondary | ICD-10-CM | POA: Diagnosis not present

## 2017-11-07 DIAGNOSIS — R739 Hyperglycemia, unspecified: Secondary | ICD-10-CM | POA: Diagnosis not present

## 2017-11-07 DIAGNOSIS — E1029 Type 1 diabetes mellitus with other diabetic kidney complication: Secondary | ICD-10-CM | POA: Diagnosis not present

## 2017-11-10 DIAGNOSIS — N186 End stage renal disease: Secondary | ICD-10-CM | POA: Diagnosis not present

## 2017-11-10 DIAGNOSIS — D509 Iron deficiency anemia, unspecified: Secondary | ICD-10-CM | POA: Diagnosis not present

## 2017-11-10 DIAGNOSIS — E1029 Type 1 diabetes mellitus with other diabetic kidney complication: Secondary | ICD-10-CM | POA: Diagnosis not present

## 2017-11-10 DIAGNOSIS — R739 Hyperglycemia, unspecified: Secondary | ICD-10-CM | POA: Diagnosis not present

## 2017-11-10 DIAGNOSIS — D631 Anemia in chronic kidney disease: Secondary | ICD-10-CM | POA: Diagnosis not present

## 2017-11-10 DIAGNOSIS — N2581 Secondary hyperparathyroidism of renal origin: Secondary | ICD-10-CM | POA: Diagnosis not present

## 2017-11-12 DIAGNOSIS — N186 End stage renal disease: Secondary | ICD-10-CM | POA: Diagnosis not present

## 2017-11-12 DIAGNOSIS — D509 Iron deficiency anemia, unspecified: Secondary | ICD-10-CM | POA: Diagnosis not present

## 2017-11-12 DIAGNOSIS — R739 Hyperglycemia, unspecified: Secondary | ICD-10-CM | POA: Diagnosis not present

## 2017-11-12 DIAGNOSIS — D631 Anemia in chronic kidney disease: Secondary | ICD-10-CM | POA: Diagnosis not present

## 2017-11-12 DIAGNOSIS — N2581 Secondary hyperparathyroidism of renal origin: Secondary | ICD-10-CM | POA: Diagnosis not present

## 2017-11-12 DIAGNOSIS — E1029 Type 1 diabetes mellitus with other diabetic kidney complication: Secondary | ICD-10-CM | POA: Diagnosis not present

## 2017-11-14 DIAGNOSIS — E1029 Type 1 diabetes mellitus with other diabetic kidney complication: Secondary | ICD-10-CM | POA: Diagnosis not present

## 2017-11-14 DIAGNOSIS — N2581 Secondary hyperparathyroidism of renal origin: Secondary | ICD-10-CM | POA: Diagnosis not present

## 2017-11-14 DIAGNOSIS — D509 Iron deficiency anemia, unspecified: Secondary | ICD-10-CM | POA: Diagnosis not present

## 2017-11-14 DIAGNOSIS — N186 End stage renal disease: Secondary | ICD-10-CM | POA: Diagnosis not present

## 2017-11-14 DIAGNOSIS — R739 Hyperglycemia, unspecified: Secondary | ICD-10-CM | POA: Diagnosis not present

## 2017-11-14 DIAGNOSIS — D631 Anemia in chronic kidney disease: Secondary | ICD-10-CM | POA: Diagnosis not present

## 2017-11-16 DIAGNOSIS — E1022 Type 1 diabetes mellitus with diabetic chronic kidney disease: Secondary | ICD-10-CM | POA: Diagnosis not present

## 2017-11-16 DIAGNOSIS — E103593 Type 1 diabetes mellitus with proliferative diabetic retinopathy without macular edema, bilateral: Secondary | ICD-10-CM | POA: Diagnosis not present

## 2017-11-16 DIAGNOSIS — Z794 Long term (current) use of insulin: Secondary | ICD-10-CM | POA: Diagnosis not present

## 2017-11-16 DIAGNOSIS — N186 End stage renal disease: Secondary | ICD-10-CM | POA: Diagnosis not present

## 2017-11-16 DIAGNOSIS — Z992 Dependence on renal dialysis: Secondary | ICD-10-CM | POA: Diagnosis not present

## 2017-11-17 DIAGNOSIS — D509 Iron deficiency anemia, unspecified: Secondary | ICD-10-CM | POA: Diagnosis not present

## 2017-11-17 DIAGNOSIS — E1029 Type 1 diabetes mellitus with other diabetic kidney complication: Secondary | ICD-10-CM | POA: Diagnosis not present

## 2017-11-17 DIAGNOSIS — N2581 Secondary hyperparathyroidism of renal origin: Secondary | ICD-10-CM | POA: Diagnosis not present

## 2017-11-17 DIAGNOSIS — D631 Anemia in chronic kidney disease: Secondary | ICD-10-CM | POA: Diagnosis not present

## 2017-11-17 DIAGNOSIS — R739 Hyperglycemia, unspecified: Secondary | ICD-10-CM | POA: Diagnosis not present

## 2017-11-17 DIAGNOSIS — N186 End stage renal disease: Secondary | ICD-10-CM | POA: Diagnosis not present

## 2017-11-18 ENCOUNTER — Ambulatory Visit (HOSPITAL_COMMUNITY)
Admission: RE | Admit: 2017-11-18 | Discharge: 2017-11-18 | Disposition: A | Discharge status: Home / Self Care | Payer: Medicare Other | Source: Ambulatory Visit | Attending: Vascular Surgery | Admitting: Vascular Surgery

## 2017-11-18 ENCOUNTER — Encounter (HOSPITAL_COMMUNITY): Payer: Self-pay

## 2017-11-18 ENCOUNTER — Ambulatory Visit (INDEPENDENT_AMBULATORY_CARE_PROVIDER_SITE_OTHER): Payer: Self-pay | Admitting: Vascular Surgery

## 2017-11-18 ENCOUNTER — Encounter: Payer: Self-pay | Admitting: Vascular Surgery

## 2017-11-18 VITALS — BP 136/78 | HR 78 | Temp 97.9°F | Resp 18 | Ht 63.0 in | Wt 121.9 lb

## 2017-11-18 DIAGNOSIS — N186 End stage renal disease: Secondary | ICD-10-CM | POA: Diagnosis not present

## 2017-11-18 DIAGNOSIS — Z992 Dependence on renal dialysis: Secondary | ICD-10-CM | POA: Insufficient documentation

## 2017-11-18 DIAGNOSIS — D509 Iron deficiency anemia, unspecified: Secondary | ICD-10-CM | POA: Diagnosis not present

## 2017-11-18 DIAGNOSIS — Z48812 Encounter for surgical aftercare following surgery on the circulatory system: Secondary | ICD-10-CM

## 2017-11-18 DIAGNOSIS — R739 Hyperglycemia, unspecified: Secondary | ICD-10-CM | POA: Diagnosis not present

## 2017-11-18 DIAGNOSIS — E1029 Type 1 diabetes mellitus with other diabetic kidney complication: Secondary | ICD-10-CM | POA: Diagnosis not present

## 2017-11-18 DIAGNOSIS — N2581 Secondary hyperparathyroidism of renal origin: Secondary | ICD-10-CM | POA: Diagnosis not present

## 2017-11-18 DIAGNOSIS — D631 Anemia in chronic kidney disease: Secondary | ICD-10-CM | POA: Diagnosis not present

## 2017-11-18 NOTE — Progress Notes (Signed)
VASCULAR SURGERY:  This patient came in for follow-up after a first stage basilic vein transposition.  However she left before I could see her.  Her appointment will have to be rescheduled.  [name], MD, [place] [phone] Office: [phone]

## 2017-11-19 DIAGNOSIS — N2581 Secondary hyperparathyroidism of renal origin: Secondary | ICD-10-CM | POA: Diagnosis not present

## 2017-11-19 DIAGNOSIS — R739 Hyperglycemia, unspecified: Secondary | ICD-10-CM | POA: Diagnosis not present

## 2017-11-19 DIAGNOSIS — N186 End stage renal disease: Secondary | ICD-10-CM | POA: Diagnosis not present

## 2017-11-19 DIAGNOSIS — D509 Iron deficiency anemia, unspecified: Secondary | ICD-10-CM | POA: Diagnosis not present

## 2017-11-19 DIAGNOSIS — E1029 Type 1 diabetes mellitus with other diabetic kidney complication: Secondary | ICD-10-CM | POA: Diagnosis not present

## 2017-11-19 DIAGNOSIS — D631 Anemia in chronic kidney disease: Secondary | ICD-10-CM | POA: Diagnosis not present

## 2017-11-21 DIAGNOSIS — D631 Anemia in chronic kidney disease: Secondary | ICD-10-CM | POA: Diagnosis not present

## 2017-11-21 DIAGNOSIS — N2581 Secondary hyperparathyroidism of renal origin: Secondary | ICD-10-CM | POA: Diagnosis not present

## 2017-11-21 DIAGNOSIS — N186 End stage renal disease: Secondary | ICD-10-CM | POA: Diagnosis not present

## 2017-11-21 DIAGNOSIS — E1029 Type 1 diabetes mellitus with other diabetic kidney complication: Secondary | ICD-10-CM | POA: Diagnosis not present

## 2017-11-21 DIAGNOSIS — R739 Hyperglycemia, unspecified: Secondary | ICD-10-CM | POA: Diagnosis not present

## 2017-11-21 DIAGNOSIS — D509 Iron deficiency anemia, unspecified: Secondary | ICD-10-CM | POA: Diagnosis not present

## 2017-11-24 DIAGNOSIS — D509 Iron deficiency anemia, unspecified: Secondary | ICD-10-CM | POA: Diagnosis not present

## 2017-11-24 DIAGNOSIS — E1029 Type 1 diabetes mellitus with other diabetic kidney complication: Secondary | ICD-10-CM | POA: Diagnosis not present

## 2017-11-24 DIAGNOSIS — N2581 Secondary hyperparathyroidism of renal origin: Secondary | ICD-10-CM | POA: Diagnosis not present

## 2017-11-24 DIAGNOSIS — R739 Hyperglycemia, unspecified: Secondary | ICD-10-CM | POA: Diagnosis not present

## 2017-11-24 DIAGNOSIS — N186 End stage renal disease: Secondary | ICD-10-CM | POA: Diagnosis not present

## 2017-11-24 DIAGNOSIS — D631 Anemia in chronic kidney disease: Secondary | ICD-10-CM | POA: Diagnosis not present

## 2017-11-28 DIAGNOSIS — D509 Iron deficiency anemia, unspecified: Secondary | ICD-10-CM | POA: Diagnosis not present

## 2017-11-28 DIAGNOSIS — R739 Hyperglycemia, unspecified: Secondary | ICD-10-CM | POA: Diagnosis not present

## 2017-11-28 DIAGNOSIS — E1029 Type 1 diabetes mellitus with other diabetic kidney complication: Secondary | ICD-10-CM | POA: Diagnosis not present

## 2017-11-28 DIAGNOSIS — N2581 Secondary hyperparathyroidism of renal origin: Secondary | ICD-10-CM | POA: Diagnosis not present

## 2017-11-28 DIAGNOSIS — N186 End stage renal disease: Secondary | ICD-10-CM | POA: Diagnosis not present

## 2017-11-28 DIAGNOSIS — D631 Anemia in chronic kidney disease: Secondary | ICD-10-CM | POA: Diagnosis not present

## 2017-12-01 DIAGNOSIS — E1029 Type 1 diabetes mellitus with other diabetic kidney complication: Secondary | ICD-10-CM | POA: Diagnosis not present

## 2017-12-01 DIAGNOSIS — D509 Iron deficiency anemia, unspecified: Secondary | ICD-10-CM | POA: Diagnosis not present

## 2017-12-01 DIAGNOSIS — N2581 Secondary hyperparathyroidism of renal origin: Secondary | ICD-10-CM | POA: Diagnosis not present

## 2017-12-01 DIAGNOSIS — R739 Hyperglycemia, unspecified: Secondary | ICD-10-CM | POA: Diagnosis not present

## 2017-12-01 DIAGNOSIS — N186 End stage renal disease: Secondary | ICD-10-CM | POA: Diagnosis not present

## 2017-12-01 DIAGNOSIS — D631 Anemia in chronic kidney disease: Secondary | ICD-10-CM | POA: Diagnosis not present

## 2017-12-03 DIAGNOSIS — E1029 Type 1 diabetes mellitus with other diabetic kidney complication: Secondary | ICD-10-CM | POA: Diagnosis not present

## 2017-12-03 DIAGNOSIS — D509 Iron deficiency anemia, unspecified: Secondary | ICD-10-CM | POA: Diagnosis not present

## 2017-12-03 DIAGNOSIS — D631 Anemia in chronic kidney disease: Secondary | ICD-10-CM | POA: Diagnosis not present

## 2017-12-03 DIAGNOSIS — N186 End stage renal disease: Secondary | ICD-10-CM | POA: Diagnosis not present

## 2017-12-03 DIAGNOSIS — R739 Hyperglycemia, unspecified: Secondary | ICD-10-CM | POA: Diagnosis not present

## 2017-12-03 DIAGNOSIS — N2581 Secondary hyperparathyroidism of renal origin: Secondary | ICD-10-CM | POA: Diagnosis not present

## 2017-12-05 DIAGNOSIS — N2581 Secondary hyperparathyroidism of renal origin: Secondary | ICD-10-CM | POA: Diagnosis not present

## 2017-12-05 DIAGNOSIS — D631 Anemia in chronic kidney disease: Secondary | ICD-10-CM | POA: Diagnosis not present

## 2017-12-05 DIAGNOSIS — E1029 Type 1 diabetes mellitus with other diabetic kidney complication: Secondary | ICD-10-CM | POA: Diagnosis not present

## 2017-12-05 DIAGNOSIS — N186 End stage renal disease: Secondary | ICD-10-CM | POA: Diagnosis not present

## 2017-12-05 DIAGNOSIS — D509 Iron deficiency anemia, unspecified: Secondary | ICD-10-CM | POA: Diagnosis not present

## 2017-12-05 DIAGNOSIS — R739 Hyperglycemia, unspecified: Secondary | ICD-10-CM | POA: Diagnosis not present

## 2017-12-08 DIAGNOSIS — N186 End stage renal disease: Secondary | ICD-10-CM | POA: Diagnosis not present

## 2017-12-08 DIAGNOSIS — R739 Hyperglycemia, unspecified: Secondary | ICD-10-CM | POA: Diagnosis not present

## 2017-12-08 DIAGNOSIS — D631 Anemia in chronic kidney disease: Secondary | ICD-10-CM | POA: Diagnosis not present

## 2017-12-08 DIAGNOSIS — N2581 Secondary hyperparathyroidism of renal origin: Secondary | ICD-10-CM | POA: Diagnosis not present

## 2017-12-08 DIAGNOSIS — D509 Iron deficiency anemia, unspecified: Secondary | ICD-10-CM | POA: Diagnosis not present

## 2017-12-08 DIAGNOSIS — E1029 Type 1 diabetes mellitus with other diabetic kidney complication: Secondary | ICD-10-CM | POA: Diagnosis not present

## 2017-12-10 DIAGNOSIS — D509 Iron deficiency anemia, unspecified: Secondary | ICD-10-CM | POA: Diagnosis not present

## 2017-12-10 DIAGNOSIS — N186 End stage renal disease: Secondary | ICD-10-CM | POA: Diagnosis not present

## 2017-12-10 DIAGNOSIS — R739 Hyperglycemia, unspecified: Secondary | ICD-10-CM | POA: Diagnosis not present

## 2017-12-10 DIAGNOSIS — N2581 Secondary hyperparathyroidism of renal origin: Secondary | ICD-10-CM | POA: Diagnosis not present

## 2017-12-10 DIAGNOSIS — D631 Anemia in chronic kidney disease: Secondary | ICD-10-CM | POA: Diagnosis not present

## 2017-12-10 DIAGNOSIS — E1029 Type 1 diabetes mellitus with other diabetic kidney complication: Secondary | ICD-10-CM | POA: Diagnosis not present

## 2017-12-12 DIAGNOSIS — E1029 Type 1 diabetes mellitus with other diabetic kidney complication: Secondary | ICD-10-CM | POA: Diagnosis not present

## 2017-12-12 DIAGNOSIS — D509 Iron deficiency anemia, unspecified: Secondary | ICD-10-CM | POA: Diagnosis not present

## 2017-12-12 DIAGNOSIS — N186 End stage renal disease: Secondary | ICD-10-CM | POA: Diagnosis not present

## 2017-12-12 DIAGNOSIS — R739 Hyperglycemia, unspecified: Secondary | ICD-10-CM | POA: Diagnosis not present

## 2017-12-12 DIAGNOSIS — D631 Anemia in chronic kidney disease: Secondary | ICD-10-CM | POA: Diagnosis not present

## 2017-12-12 DIAGNOSIS — N2581 Secondary hyperparathyroidism of renal origin: Secondary | ICD-10-CM | POA: Diagnosis not present

## 2017-12-15 DIAGNOSIS — E1029 Type 1 diabetes mellitus with other diabetic kidney complication: Secondary | ICD-10-CM | POA: Diagnosis not present

## 2017-12-15 DIAGNOSIS — D509 Iron deficiency anemia, unspecified: Secondary | ICD-10-CM | POA: Diagnosis not present

## 2017-12-15 DIAGNOSIS — N186 End stage renal disease: Secondary | ICD-10-CM | POA: Diagnosis not present

## 2017-12-15 DIAGNOSIS — R739 Hyperglycemia, unspecified: Secondary | ICD-10-CM | POA: Diagnosis not present

## 2017-12-15 DIAGNOSIS — N2581 Secondary hyperparathyroidism of renal origin: Secondary | ICD-10-CM | POA: Diagnosis not present

## 2017-12-15 DIAGNOSIS — D631 Anemia in chronic kidney disease: Secondary | ICD-10-CM | POA: Diagnosis not present

## 2017-12-17 DIAGNOSIS — E1029 Type 1 diabetes mellitus with other diabetic kidney complication: Secondary | ICD-10-CM | POA: Diagnosis not present

## 2017-12-17 DIAGNOSIS — N2581 Secondary hyperparathyroidism of renal origin: Secondary | ICD-10-CM | POA: Diagnosis not present

## 2017-12-17 DIAGNOSIS — D509 Iron deficiency anemia, unspecified: Secondary | ICD-10-CM | POA: Diagnosis not present

## 2017-12-17 DIAGNOSIS — R739 Hyperglycemia, unspecified: Secondary | ICD-10-CM | POA: Diagnosis not present

## 2017-12-17 DIAGNOSIS — D631 Anemia in chronic kidney disease: Secondary | ICD-10-CM | POA: Diagnosis not present

## 2017-12-17 DIAGNOSIS — N186 End stage renal disease: Secondary | ICD-10-CM | POA: Diagnosis not present

## 2017-12-18 DIAGNOSIS — R739 Hyperglycemia, unspecified: Secondary | ICD-10-CM | POA: Diagnosis not present

## 2017-12-18 DIAGNOSIS — D509 Iron deficiency anemia, unspecified: Secondary | ICD-10-CM | POA: Diagnosis not present

## 2017-12-18 DIAGNOSIS — N186 End stage renal disease: Secondary | ICD-10-CM | POA: Diagnosis not present

## 2017-12-18 DIAGNOSIS — N2581 Secondary hyperparathyroidism of renal origin: Secondary | ICD-10-CM | POA: Diagnosis not present

## 2017-12-18 DIAGNOSIS — E1029 Type 1 diabetes mellitus with other diabetic kidney complication: Secondary | ICD-10-CM | POA: Diagnosis not present

## 2017-12-18 DIAGNOSIS — D631 Anemia in chronic kidney disease: Secondary | ICD-10-CM | POA: Diagnosis not present

## 2017-12-19 DIAGNOSIS — E1029 Type 1 diabetes mellitus with other diabetic kidney complication: Secondary | ICD-10-CM | POA: Diagnosis not present

## 2017-12-19 DIAGNOSIS — Z992 Dependence on renal dialysis: Secondary | ICD-10-CM | POA: Diagnosis not present

## 2017-12-19 DIAGNOSIS — N186 End stage renal disease: Secondary | ICD-10-CM | POA: Diagnosis not present

## 2017-12-22 DIAGNOSIS — D631 Anemia in chronic kidney disease: Secondary | ICD-10-CM | POA: Diagnosis not present

## 2017-12-22 DIAGNOSIS — N2581 Secondary hyperparathyroidism of renal origin: Secondary | ICD-10-CM | POA: Diagnosis not present

## 2017-12-22 DIAGNOSIS — E1029 Type 1 diabetes mellitus with other diabetic kidney complication: Secondary | ICD-10-CM | POA: Diagnosis not present

## 2017-12-22 DIAGNOSIS — N186 End stage renal disease: Secondary | ICD-10-CM | POA: Diagnosis not present

## 2017-12-23 ENCOUNTER — Other Ambulatory Visit: Payer: Self-pay

## 2017-12-23 ENCOUNTER — Ambulatory Visit (INDEPENDENT_AMBULATORY_CARE_PROVIDER_SITE_OTHER): Payer: Medicare Other | Admitting: Vascular Surgery

## 2017-12-23 ENCOUNTER — Encounter (HOSPITAL_COMMUNITY): Payer: Self-pay

## 2017-12-23 ENCOUNTER — Ambulatory Visit (HOSPITAL_COMMUNITY)
Admission: RE | Admit: 2017-12-23 | Discharge: 2017-12-23 | Disposition: A | Discharge status: Home / Self Care | Payer: Medicare Other | Source: Ambulatory Visit | Attending: Vascular Surgery | Admitting: Vascular Surgery

## 2017-12-23 ENCOUNTER — Encounter: Payer: Self-pay | Admitting: *Deleted

## 2017-12-23 ENCOUNTER — Encounter: Payer: Self-pay | Admitting: Vascular Surgery

## 2017-12-23 ENCOUNTER — Other Ambulatory Visit: Payer: Self-pay | Admitting: *Deleted

## 2017-12-23 VITALS — BP 198/106 | HR 81 | Temp 97.5°F | Resp 16 | Ht 63.0 in | Wt 119.0 lb

## 2017-12-23 DIAGNOSIS — Z992 Dependence on renal dialysis: Secondary | ICD-10-CM

## 2017-12-23 DIAGNOSIS — N186 End stage renal disease: Secondary | ICD-10-CM | POA: Diagnosis not present

## 2017-12-23 DIAGNOSIS — T82858A Stenosis of vascular prosthetic devices, implants and grafts, initial encounter: Secondary | ICD-10-CM | POA: Insufficient documentation

## 2017-12-23 NOTE — H&P (View-Only) (Signed)
Patient name: [name] MRN: [id number] DOB: 1975/08/17 Sex: female  REASON FOR VISIT:   Follow-up of first stage basilic vein transposition.  HPI:   [name] is a pleasant 42 y.o. female who underwent a right first stage basilic vein transposition on 09/28/2017.  She was noted to have a 3 mm basilic vein and a 2.5 mm radial artery.  The patient has a high bifurcation of the brachial artery.  She comes in for a follow-up visit.  She denies pain or paresthesias in her right arm.  She is currently using a degenerative left upper arm graft. This has been working well.  She dialyzes on Tuesdays Thursdays and Saturdays.   The patient denies pain or paresthesias in her right arm.  Current Outpatient Medications  Medication Sig Dispense Refill  . amLODipine (NORVASC) 10 MG tablet Take 10 mg by mouth daily.     Marland Kitchen aspirin 81 MG EC tablet Take 81 mg by mouth daily. Swallow whole.    . calcium acetate (PHOSLO) 667 MG capsule Take 667 mg by mouth 3 (three) times daily with meals.      . cinacalcet (SENSIPAR) 30 MG tablet Take 30 mg by mouth daily.     Marland Kitchen ethyl chloride spray USE TOPICAL PRIOR TO TREATMENTS  3  . GLUCAGON EMERGENCY 1 MG injection Inject 1 mg into the muscle once as needed (for severe low blood sugar).     . insulin aspart (NOVOLOG) 100 UNIT/ML injection novolog 100 units/ml via Insulin pump  Basal rate : 0.29/hour    . Insulin Human (INSULIN PUMP) SOLN Inject into the skin continuous. Novolog   Basal rate 0.29 units/ hour    . labetalol (NORMODYNE) 300 MG tablet Take 300 mg by mouth 3 (three) times daily.    Marland Kitchen loratadine (CLARITIN) 10 MG tablet Take 10 mg by mouth daily as needed for allergies.    . Pediatric Multiple Vit-C-FA (FLINSTONES GUMMIES OMEGA-3 DHA) CHEW Chew 2 tablets by mouth daily.     . rosuvastatin (CRESTOR) 10 MG tablet Take 10 mg by mouth every evening.      No current facility-administered medications for this visit.     REVIEW OF SYSTEMS:  [X]   denotes positive finding, [ ]  denotes negative finding Cardiac  Comments:  Chest pain or chest pressure:    Shortness of breath upon exertion:    Short of breath when lying flat:    Irregular heart rhythm:    Constitutional    Fever or chills:     PHYSICAL EXAM:   Vitals:   12/23/17 0904  BP: (!) 198/106  Pulse: 81  Resp: 16  Temp: (!) 97.5 F (36.4 C)  TempSrc: Oral  SpO2: 99%  Weight: 119 lb (54 kg)  Height: 5\' 3"  (1.6 m)    GENERAL: The patient is a well-nourished female, in no acute distress. The vital signs are documented above. CARDIOVASCULAR: There is a regular rate and rhythm. PULMONARY: There is good air exchange bilaterally without wheezing or rales. Her right upper arm first stage basilic vein transposition has an excellent thrill. She has a palpable right radial pulse.  DATA:   DUPLEX AV FISTULA: I have reviewed the duplex of the fistula that was done on 11/18/2017.  This showed that the diameters of the fistula ranged from 0.25-0.5 to centimeters.  FOLLOW-UP DUPLEX: I repeated her duplex scan today given that it had been over a month since her last duplex.  This shows the diameters  of the fistula range from 0.13 to 0.34 cm.  There is an area of significant intimal hyperplasia within the vein.  MEDICAL ISSUES:   END-STAGE RENAL DISEASE: The patient is status post a right basilic vein transposition (first stage).  Based on the duplex over a month ago the vein was not really maturing adequately.  However on exam she has an excellent thrill.  I repeated her duplex scan today.  It appears that the fistula is not maturing and therefore I do not think she is a candidate for a second stage basilic vein transposition.  I have recommended placement of a upper arm loop graft on the right given that she has a high brachial artery bifurcation.  This has been scheduled for 01/11/2018.  I have discussed the procedure and potential complications and she is agreeable to  proceed.  HYPERTENSION: The patient's initial blood pressure today was elevated. We repeated this and this was still elevated. We have encouraged the patient to follow up with their primary care physician for management of their blood pressure.   [name] [facility] [phone]

## 2017-12-23 NOTE — Progress Notes (Signed)
Patient name: [name] MRN: [id number] DOB: August 31, 1975 Sex: female  REASON FOR VISIT:   Follow-up of first stage basilic vein transposition.  HPI:   [name] is a pleasant 42 y.o. female who underwent a right first stage basilic vein transposition on 09/28/2017.  She was noted to have a 3 mm basilic vein and a 2.5 mm radial artery.  The patient has a high bifurcation of the brachial artery.  She comes in for a follow-up visit.  She denies pain or paresthesias in her right arm.  She is currently using a degenerative left upper arm graft. This has been working well.  She dialyzes on Tuesdays Thursdays and Saturdays.   The patient denies pain or paresthesias in her right arm.  Current Outpatient Medications  Medication Sig Dispense Refill  . amLODipine (NORVASC) 10 MG tablet Take 10 mg by mouth daily.     Marland Kitchen aspirin 81 MG EC tablet Take 81 mg by mouth daily. Swallow whole.    . calcium acetate (PHOSLO) 667 MG capsule Take 667 mg by mouth 3 (three) times daily with meals.      . cinacalcet (SENSIPAR) 30 MG tablet Take 30 mg by mouth daily.     Marland Kitchen ethyl chloride spray USE TOPICAL PRIOR TO TREATMENTS  3  . GLUCAGON EMERGENCY 1 MG injection Inject 1 mg into the muscle once as needed (for severe low blood sugar).     . insulin aspart (NOVOLOG) 100 UNIT/ML injection novolog 100 units/ml via Insulin pump  Basal rate : 0.29/hour    . Insulin Human (INSULIN PUMP) SOLN Inject into the skin continuous. Novolog   Basal rate 0.29 units/ hour    . labetalol (NORMODYNE) 300 MG tablet Take 300 mg by mouth 3 (three) times daily.    Marland Kitchen loratadine (CLARITIN) 10 MG tablet Take 10 mg by mouth daily as needed for allergies.    . Pediatric Multiple Vit-C-FA (FLINSTONES GUMMIES OMEGA-3 DHA) CHEW Chew 2 tablets by mouth daily.     . rosuvastatin (CRESTOR) 10 MG tablet Take 10 mg by mouth every evening.      No current facility-administered medications for this visit.     REVIEW OF SYSTEMS:  [X]   denotes positive finding, [ ]  denotes negative finding Cardiac  Comments:  Chest pain or chest pressure:    Shortness of breath upon exertion:    Short of breath when lying flat:    Irregular heart rhythm:    Constitutional    Fever or chills:     PHYSICAL EXAM:   Vitals:   12/23/17 0904  BP: (!) 198/106  Pulse: 81  Resp: 16  Temp: (!) 97.5 F (36.4 C)  TempSrc: Oral  SpO2: 99%  Weight: 119 lb (54 kg)  Height: 5\' 3"  (1.6 m)    GENERAL: The patient is a well-nourished female, in no acute distress. The vital signs are documented above. CARDIOVASCULAR: There is a regular rate and rhythm. PULMONARY: There is good air exchange bilaterally without wheezing or rales. Her right upper arm first stage basilic vein transposition has an excellent thrill. She has a palpable right radial pulse.  DATA:   DUPLEX AV FISTULA: I have reviewed the duplex of the fistula that was done on 11/18/2017.  This showed that the diameters of the fistula ranged from 0.25-0.5 to centimeters.  FOLLOW-UP DUPLEX: I repeated her duplex scan today given that it had been over a month since her last duplex.  This shows the diameters  of the fistula range from 0.13 to 0.34 cm.  There is an area of significant intimal hyperplasia within the vein.  MEDICAL ISSUES:   END-STAGE RENAL DISEASE: The patient is status post a right basilic vein transposition (first stage).  Based on the duplex over a month ago the vein was not really maturing adequately.  However on exam she has an excellent thrill.  I repeated her duplex scan today.  It appears that the fistula is not maturing and therefore I do not think she is a candidate for a second stage basilic vein transposition.  I have recommended placement of a upper arm loop graft on the right given that she has a high brachial artery bifurcation.  This has been scheduled for 01/11/2018.  I have discussed the procedure and potential complications and she is agreeable to  proceed.  HYPERTENSION: The patient's initial blood pressure today was elevated. We repeated this and this was still elevated. We have encouraged the patient to follow up with their primary care physician for management of their blood pressure.   [name] [facility] [phone]

## 2017-12-24 DIAGNOSIS — D631 Anemia in chronic kidney disease: Secondary | ICD-10-CM | POA: Diagnosis not present

## 2017-12-24 DIAGNOSIS — N2581 Secondary hyperparathyroidism of renal origin: Secondary | ICD-10-CM | POA: Diagnosis not present

## 2017-12-24 DIAGNOSIS — N186 End stage renal disease: Secondary | ICD-10-CM | POA: Diagnosis not present

## 2017-12-24 DIAGNOSIS — E1029 Type 1 diabetes mellitus with other diabetic kidney complication: Secondary | ICD-10-CM | POA: Diagnosis not present

## 2017-12-26 DIAGNOSIS — E1029 Type 1 diabetes mellitus with other diabetic kidney complication: Secondary | ICD-10-CM | POA: Diagnosis not present

## 2017-12-26 DIAGNOSIS — N186 End stage renal disease: Secondary | ICD-10-CM | POA: Diagnosis not present

## 2017-12-26 DIAGNOSIS — D631 Anemia in chronic kidney disease: Secondary | ICD-10-CM | POA: Diagnosis not present

## 2017-12-26 DIAGNOSIS — N2581 Secondary hyperparathyroidism of renal origin: Secondary | ICD-10-CM | POA: Diagnosis not present

## 2017-12-29 DIAGNOSIS — D631 Anemia in chronic kidney disease: Secondary | ICD-10-CM | POA: Diagnosis not present

## 2017-12-29 DIAGNOSIS — E1029 Type 1 diabetes mellitus with other diabetic kidney complication: Secondary | ICD-10-CM | POA: Diagnosis not present

## 2017-12-29 DIAGNOSIS — N2581 Secondary hyperparathyroidism of renal origin: Secondary | ICD-10-CM | POA: Diagnosis not present

## 2017-12-29 DIAGNOSIS — N186 End stage renal disease: Secondary | ICD-10-CM | POA: Diagnosis not present

## 2017-12-31 DIAGNOSIS — D631 Anemia in chronic kidney disease: Secondary | ICD-10-CM | POA: Diagnosis not present

## 2017-12-31 DIAGNOSIS — N2581 Secondary hyperparathyroidism of renal origin: Secondary | ICD-10-CM | POA: Diagnosis not present

## 2017-12-31 DIAGNOSIS — N186 End stage renal disease: Secondary | ICD-10-CM | POA: Diagnosis not present

## 2017-12-31 DIAGNOSIS — E1029 Type 1 diabetes mellitus with other diabetic kidney complication: Secondary | ICD-10-CM | POA: Diagnosis not present

## 2018-01-02 DIAGNOSIS — D631 Anemia in chronic kidney disease: Secondary | ICD-10-CM | POA: Diagnosis not present

## 2018-01-02 DIAGNOSIS — N186 End stage renal disease: Secondary | ICD-10-CM | POA: Diagnosis not present

## 2018-01-02 DIAGNOSIS — N2581 Secondary hyperparathyroidism of renal origin: Secondary | ICD-10-CM | POA: Diagnosis not present

## 2018-01-02 DIAGNOSIS — E1029 Type 1 diabetes mellitus with other diabetic kidney complication: Secondary | ICD-10-CM | POA: Diagnosis not present

## 2018-01-05 DIAGNOSIS — E1029 Type 1 diabetes mellitus with other diabetic kidney complication: Secondary | ICD-10-CM | POA: Diagnosis not present

## 2018-01-05 DIAGNOSIS — N2581 Secondary hyperparathyroidism of renal origin: Secondary | ICD-10-CM | POA: Diagnosis not present

## 2018-01-05 DIAGNOSIS — D631 Anemia in chronic kidney disease: Secondary | ICD-10-CM | POA: Diagnosis not present

## 2018-01-05 DIAGNOSIS — N186 End stage renal disease: Secondary | ICD-10-CM | POA: Diagnosis not present

## 2018-01-07 DIAGNOSIS — N2581 Secondary hyperparathyroidism of renal origin: Secondary | ICD-10-CM | POA: Diagnosis not present

## 2018-01-07 DIAGNOSIS — N186 End stage renal disease: Secondary | ICD-10-CM | POA: Diagnosis not present

## 2018-01-07 DIAGNOSIS — E1029 Type 1 diabetes mellitus with other diabetic kidney complication: Secondary | ICD-10-CM | POA: Diagnosis not present

## 2018-01-07 DIAGNOSIS — D631 Anemia in chronic kidney disease: Secondary | ICD-10-CM | POA: Diagnosis not present

## 2018-01-08 ENCOUNTER — Other Ambulatory Visit: Payer: Self-pay

## 2018-01-08 ENCOUNTER — Encounter (HOSPITAL_COMMUNITY): Payer: Self-pay | Admitting: *Deleted

## 2018-01-08 NOTE — Progress Notes (Addendum)
[place] again and verified that pt's blood pressure was being taken on her thigh. The nurse I spoke with states pt's blood pressure is being taken on her ankle. She states pt's blood pressures are still high, running in the 180's and 190's. Highest it's been is 216/105. She verified that pt takes Labetal 300 mg 3 times a day and Amlodipine 10 mg every evening. Diabetes Coordinator, Morrison Old, RN returned my page. She was given pt's surgery time, length of surgery and day of surgery.

## 2018-01-08 NOTE — Progress Notes (Addendum)
Spoke with pt for pre-op call. Pt denies cardiac history or chest pain. Pt is a type 1 Diabetic. Last A1C was 6.1 on 11/12/17 per [facility]. Pt states her fasting blood sugar today is 92. Pt has a continuous blood sugar monitor on her. Pt also has an insulin pump. Instructed pt to reduce her basal rate by 20% Sunday night at midnight. Instructed her to check her blood sugar when she gets up Monday AM and every 2 hours until she leaves for the hospital. If blood sugar is 70 or below, treat with Glucagon or a 1/2 cup of clear juice (apple or cranberry) and recheck blood sugar 15 minutes after drinking juice. If blood sugar continues to be 70 or below, call the Short Stay department and ask to speak to a nurse. Pt voiced understanding Diabetes Coordinator has been paged to notify them of pt's surgery date and time.  Dr. Nicole Cella note stated that pt's blood pressure was elevated the day of the visit. He suggested that she see her PCP. I asked her what her blood pressure has been and she states it's still high because she is having to have her blood pressure taken in her thigh. She stated that she was having her blood pressure done in her arm and it was normal, but that messed up her fistula. She states she has not seen her PCP.

## 2018-01-08 NOTE — Progress Notes (Signed)
Anesthesia Chart Review:  Pt is a same day work up   Case:  [id number] Date/Time:  01/11/18 0924   Procedure:  INSERTION OF ARTERIOVENOUS (AV) GORE-TEX GRAFT  UPPER ARM (Right )   Anesthesia type:  Choice   Pre-op diagnosis:  end stage renal disease   Location:  [facility] OR ROOM 11 / [place] [facility]   Surgeon:  Angelia Mould, MD      DISCUSSION: - Pt is a 42 year old female with hx stroke, HTN, DM, ESRD on hemodialysis  - At last office visit with Dr. Scot Dock, BP was 198/106. Dr. Scot Dock advised pt to f/u with PCP regarding HTN management.  Pt has not done so.  Per dialysis center, pt's BP being taken in her ankle due to dialysis access issues, and recently BPs have been high with systolic readings in 353'I-144'R; highest recent recorded BP was 216/105    - Reviewed case with Dr. Royce Macadamia.    PROVIDERS: - PCP is Benito Mccreedy, MD  - Endocrinology care in [facility] with [facility] (notes in care everywhere)  - Dialyzes at [facility]    LABS: Will be obtained day of surgery  EKG 09/28/17: NSR. Possible LA enlargement. LVH. T wave abnormality, consider inferior ischemia. Prolonged QT - Inferolateral T wave changes present on EKG dating back to at least 09/11/14 - Pt has had prior dialysis access procedure since this EKG.    CV:  Carotid duplex 03/31/14:  - The vertebral arteries appear patent with antegrade flow. - Findings consistent with 1-39 percent stenosis involving theright internal carotid artery and the left internal carotidartery.  Echo 03/31/14:  - Left ventricle: E/e&'>14.4 suggestive of elevated LV fillingpressures. The cavity size was normal. Systolic function wasnormal. The estimated ejection fraction was in the range of 55%to 60%. Wall motion was normal; there were no regional wallmotion abnormalities. - Aortic valve: Moderate thickening and calcification, consistentwith sclerosis. - Mitral valve: There was trivial regurgitation. - Tricuspid valve: There  was trivial regurgitation. - Pulmonic valve: There was trivial regurgitation. - Pericardium, extracardiac: A trivial pericardial effusion wasidentified posterior to the heart.   Past Medical History:  Diagnosis Date  . Anemia   . Arthritis   . Diabetes mellitus   . Diabetic retinopathy ([place])    Hx of laser Rx's  . DM type 1 (diabetes mellitus, type 1) ([place]) 09/12/2014   Age of onset for DM type 1 was age 19.     . Enlarged thyroid gland   . ESRD on hemodialysis ([place])    ESRD due to DM type I, age of onset DM 1 was age 26.  Went on dialysis in March 2012.  Gets HD now at [facility] on a TTS schedule.    Marland Kitchen ESRD on hemodialysis ([place]) 09/12/2014   ESRD due to DM type 1.  Started HD in 2012 at [facility].  Gets HD there on a TTS schedule.    Marland Kitchen GERD (gastroesophageal reflux disease)   . History of blood transfusion   . Hyperlipidemia   . Hypertension   . Peripheral vascular disease ([place])   . Pneumonia   . Stroke [facility])    facial drooping    Past Surgical History:  Procedure Laterality Date  . ARTERIOVENOUS GRAFT PLACEMENT    . ARTERY REPAIR Left 12/10/2012   Procedure: BRACHIAL ARTERY REPAIR;  Surgeon: Angelia Mould, MD;  Location: [facility] OR;  Service: Vascular;  Laterality: Left;  Exploration Left Brachial Artery for AVF  .  BASCILIC VEIN TRANSPOSITION Right 09/28/2017   Procedure: FIRST STAGE BASILIC VEIN TRANSPOSITION;  Surgeon: Angelia Mould, MD;  Location: [place];  Service: Vascular;  Laterality: Right;  . CESAREAN SECTION    . EYE SURGERY     [place]  . REVISION OF ARTERIOVENOUS GORETEX GRAFT Left 08/01/2016   Procedure: REVISION OF ARTERIOVENOUS GORETEX GRAFT;  Surgeon: Angelia Mould, MD;  Location: [place];  Service: Vascular;  Laterality: Left;  . SHUNTOGRAM Left 11/30/2013   Procedure: SHUNTOGRAM;  Surgeon: Serafina Mitchell, MD;  Location: [facility] CATH LAB;  Service: Cardiovascular;  Laterality: Left;    MEDICATIONS: No current facility-administered medications  for this encounter.    Marland Kitchen amLODipine (NORVASC) 10 MG tablet  . aspirin 81 MG EC tablet  . calcium acetate (PHOSLO) 667 MG capsule  . cinacalcet (SENSIPAR) 30 MG tablet  . ethyl chloride spray  . GLUCAGON EMERGENCY 1 MG injection  . insulin aspart (NOVOLOG) 100 UNIT/ML injection  . Insulin Human (INSULIN PUMP) SOLN  . labetalol (NORMODYNE) 300 MG tablet  . loratadine (CLARITIN) 10 MG tablet  . Pediatric Multiple Vit-C-FA (FLINSTONES GUMMIES OMEGA-3 DHA) CHEW  . rosuvastatin (CRESTOR) 10 MG tablet    If labs acceptable day of surgery, I anticipate pt can proceed with surgery as scheduled.  Willeen Cass, FNP-BC [facility]/Anesthesiology Phone: [phone] 01/08/2018 4:02 PM

## 2018-01-09 DIAGNOSIS — N186 End stage renal disease: Secondary | ICD-10-CM | POA: Diagnosis not present

## 2018-01-09 DIAGNOSIS — D631 Anemia in chronic kidney disease: Secondary | ICD-10-CM | POA: Diagnosis not present

## 2018-01-09 DIAGNOSIS — E1029 Type 1 diabetes mellitus with other diabetic kidney complication: Secondary | ICD-10-CM | POA: Diagnosis not present

## 2018-01-09 DIAGNOSIS — N2581 Secondary hyperparathyroidism of renal origin: Secondary | ICD-10-CM | POA: Diagnosis not present

## 2018-01-11 ENCOUNTER — Ambulatory Visit (HOSPITAL_COMMUNITY): Payer: Medicare Other | Admitting: Emergency Medicine

## 2018-01-11 ENCOUNTER — Other Ambulatory Visit: Payer: Self-pay

## 2018-01-11 ENCOUNTER — Encounter (HOSPITAL_COMMUNITY): Payer: Self-pay | Admitting: *Deleted

## 2018-01-11 ENCOUNTER — Ambulatory Visit (HOSPITAL_COMMUNITY)
Admission: RE | Admit: 2018-01-11 | Discharge: 2018-01-11 | Disposition: A | Discharge status: Home / Self Care | Payer: Medicare Other | Source: Ambulatory Visit | Attending: Vascular Surgery | Admitting: Vascular Surgery

## 2018-01-11 ENCOUNTER — Encounter (HOSPITAL_COMMUNITY)
Admission: RE | Disposition: A | Discharge status: Home / Self Care | Payer: Self-pay | Source: Ambulatory Visit | Attending: Vascular Surgery

## 2018-01-11 DIAGNOSIS — I739 Peripheral vascular disease, unspecified: Secondary | ICD-10-CM | POA: Diagnosis not present

## 2018-01-11 DIAGNOSIS — N186 End stage renal disease: Secondary | ICD-10-CM | POA: Insufficient documentation

## 2018-01-11 DIAGNOSIS — Z79899 Other long term (current) drug therapy: Secondary | ICD-10-CM | POA: Insufficient documentation

## 2018-01-11 DIAGNOSIS — Z794 Long term (current) use of insulin: Secondary | ICD-10-CM | POA: Insufficient documentation

## 2018-01-11 DIAGNOSIS — N185 Chronic kidney disease, stage 5: Secondary | ICD-10-CM | POA: Diagnosis not present

## 2018-01-11 DIAGNOSIS — Z7982 Long term (current) use of aspirin: Secondary | ICD-10-CM | POA: Insufficient documentation

## 2018-01-11 DIAGNOSIS — I69392 Facial weakness following cerebral infarction: Secondary | ICD-10-CM | POA: Insufficient documentation

## 2018-01-11 DIAGNOSIS — E10319 Type 1 diabetes mellitus with unspecified diabetic retinopathy without macular edema: Secondary | ICD-10-CM | POA: Diagnosis not present

## 2018-01-11 DIAGNOSIS — E785 Hyperlipidemia, unspecified: Secondary | ICD-10-CM | POA: Insufficient documentation

## 2018-01-11 DIAGNOSIS — E1022 Type 1 diabetes mellitus with diabetic chronic kidney disease: Secondary | ICD-10-CM | POA: Insufficient documentation

## 2018-01-11 DIAGNOSIS — I120 Hypertensive chronic kidney disease with stage 5 chronic kidney disease or end stage renal disease: Secondary | ICD-10-CM | POA: Diagnosis not present

## 2018-01-11 DIAGNOSIS — Z992 Dependence on renal dialysis: Secondary | ICD-10-CM | POA: Insufficient documentation

## 2018-01-11 DIAGNOSIS — E1051 Type 1 diabetes mellitus with diabetic peripheral angiopathy without gangrene: Secondary | ICD-10-CM | POA: Insufficient documentation

## 2018-01-11 HISTORY — PX: AV FISTULA PLACEMENT: SHX1204

## 2018-01-11 LAB — GLUCOSE, CAPILLARY
Glucose-Capillary: 77 mg/dL (ref 65–99)
Glucose-Capillary: 227 mg/dL — ABNORMAL HIGH (ref 65–99)
Glucose-Capillary: 258 mg/dL — ABNORMAL HIGH (ref 65–99)
Glucose-Capillary: 234 mg/dL — ABNORMAL HIGH (ref 65–99)

## 2018-01-11 LAB — HCG, SERUM, QUALITATIVE: PREG SERUM: NEGATIVE

## 2018-01-11 LAB — POCT I-STAT 4, (NA,K, GLUC, HGB,HCT)
GLUCOSE: 72 mg/dL (ref 65–99)
HCT: 37 % (ref 36.0–46.0)
HEMOGLOBIN: 12.6 g/dL (ref 12.0–15.0)
POTASSIUM: 4.2 mmol/L (ref 3.5–5.1)
Sodium: 139 mmol/L (ref 135–145)

## 2018-01-11 SURGERY — INSERTION OF ARTERIOVENOUS (AV) GORE-TEX GRAFT ARM
Anesthesia: General | Laterality: Right

## 2018-01-11 MED ORDER — ONDANSETRON HCL 4 MG/2ML IJ SOLN
INTRAMUSCULAR | Status: AC
  Filled 2018-01-11: qty 2

## 2018-01-11 MED ORDER — LIDOCAINE 2% (20 MG/ML) 5 ML SYRINGE
INTRAMUSCULAR | Status: DC | PRN
  Administered 2018-01-11: 60 mg via INTRAVENOUS

## 2018-01-11 MED ORDER — FENTANYL CITRATE (PF) 100 MCG/2ML IJ SOLN
INTRAMUSCULAR | Status: DC | PRN
  Administered 2018-01-11 (×2): 50 ug via INTRAVENOUS

## 2018-01-11 MED ORDER — HEMOSTATIC AGENTS (NO CHARGE) OPTIME
TOPICAL | Status: DC | PRN
  Administered 2018-01-11: 1 via TOPICAL

## 2018-01-11 MED ORDER — SODIUM CHLORIDE 0.9 % IV SOLN
INTRAVENOUS | Status: DC
  Administered 2018-01-11: 08:00:00 via INTRAVENOUS

## 2018-01-11 MED ORDER — PROTAMINE SULFATE 10 MG/ML IV SOLN
INTRAVENOUS | Status: AC
  Filled 2018-01-11: qty 10

## 2018-01-11 MED ORDER — SUCCINYLCHOLINE CHLORIDE 200 MG/10ML IV SOSY
PREFILLED_SYRINGE | INTRAVENOUS | Status: DC | PRN
  Administered 2018-01-11: 60 mg via INTRAVENOUS

## 2018-01-11 MED ORDER — PROMETHAZINE HCL 25 MG/ML IJ SOLN
INTRAMUSCULAR | Status: AC
  Administered 2018-01-11: 6.25 mg via INTRAVENOUS
  Filled 2018-01-11: qty 1

## 2018-01-11 MED ORDER — ONDANSETRON HCL 4 MG/2ML IJ SOLN
INTRAMUSCULAR | Status: DC | PRN
  Administered 2018-01-11: 4 mg via INTRAVENOUS

## 2018-01-11 MED ORDER — SCOPOLAMINE 1 MG/3DAYS TD PT72
MEDICATED_PATCH | TRANSDERMAL | Status: AC
  Filled 2018-01-11: qty 1

## 2018-01-11 MED ORDER — MEPERIDINE HCL 50 MG/ML IJ SOLN: 6.2500 mg | INTRAMUSCULAR | Status: DC | PRN

## 2018-01-11 MED ORDER — SCOPOLAMINE 1 MG/3DAYS TD PT72
MEDICATED_PATCH | TRANSDERMAL | Status: DC | PRN
  Administered 2018-01-11: 1 via TRANSDERMAL

## 2018-01-11 MED ORDER — PROPOFOL 10 MG/ML IV BOLUS
INTRAVENOUS | Status: AC
  Filled 2018-01-11: qty 20

## 2018-01-11 MED ORDER — HEPARIN SODIUM (PORCINE) 1000 UNIT/ML IJ SOLN
INTRAMUSCULAR | Status: AC
  Filled 2018-01-11: qty 1

## 2018-01-11 MED ORDER — LACTATED RINGERS IV SOLN: INTRAVENOUS | Status: DC

## 2018-01-11 MED ORDER — LIDOCAINE 2% (20 MG/ML) 5 ML SYRINGE
INTRAMUSCULAR | Status: AC
  Filled 2018-01-11: qty 5

## 2018-01-11 MED ORDER — OXYCODONE HCL 5 MG PO TABS: 5.0000 mg | ORAL_TABLET | ORAL | 0 refills | Status: DC | PRN

## 2018-01-11 MED ORDER — PHENYLEPHRINE HCL 10 MG/ML IJ SOLN
INTRAMUSCULAR | Status: DC | PRN
  Administered 2018-01-11 (×2): 120 ug via INTRAVENOUS
  Administered 2018-01-11: 160 ug via INTRAVENOUS

## 2018-01-11 MED ORDER — MIDAZOLAM HCL 2 MG/2ML IJ SOLN
INTRAMUSCULAR | Status: DC | PRN
  Administered 2018-01-11 (×2): 1 mg via INTRAVENOUS

## 2018-01-11 MED ORDER — PROPOFOL 10 MG/ML IV BOLUS
INTRAVENOUS | Status: DC | PRN
  Administered 2018-01-11: 130 mg via INTRAVENOUS

## 2018-01-11 MED ORDER — HEPARIN SODIUM (PORCINE) 1000 UNIT/ML IJ SOLN
INTRAMUSCULAR | Status: DC | PRN
  Administered 2018-01-11: 5000 [IU] via INTRAVENOUS

## 2018-01-11 MED ORDER — FENTANYL CITRATE (PF) 250 MCG/5ML IJ SOLN
INTRAMUSCULAR | Status: AC
  Filled 2018-01-11: qty 5

## 2018-01-11 MED ORDER — EPHEDRINE SULFATE 50 MG/ML IJ SOLN
INTRAMUSCULAR | Status: DC | PRN
  Administered 2018-01-11: 10 mg via INTRAVENOUS
  Administered 2018-01-11: 20 mg via INTRAVENOUS

## 2018-01-11 MED ORDER — ALBUMIN HUMAN 5 % IV SOLN
INTRAVENOUS | Status: DC | PRN
  Administered 2018-01-11: 11:00:00 via INTRAVENOUS

## 2018-01-11 MED ORDER — PROTAMINE SULFATE 10 MG/ML IV SOLN
INTRAVENOUS | Status: DC | PRN
  Administered 2018-01-11: 10 mg via INTRAVENOUS
  Administered 2018-01-11: 20 mg via INTRAVENOUS

## 2018-01-11 MED ORDER — MIDAZOLAM HCL 2 MG/2ML IJ SOLN
INTRAMUSCULAR | Status: AC
  Filled 2018-01-11: qty 2

## 2018-01-11 MED ORDER — PROMETHAZINE HCL 25 MG/ML IJ SOLN
6.2500 mg | INTRAMUSCULAR | Status: DC | PRN
  Administered 2018-01-11: 6.25 mg via INTRAVENOUS

## 2018-01-11 MED ORDER — LIDOCAINE HCL (PF) 1 % IJ SOLN
INTRAMUSCULAR | Status: AC
  Filled 2018-01-11: qty 30

## 2018-01-11 MED ORDER — PROTAMINE SULFATE 10 MG/ML IV SOLN
INTRAVENOUS | Status: AC
  Filled 2018-01-11: qty 50

## 2018-01-11 MED ORDER — HEPARIN SODIUM (PORCINE) 5000 UNIT/ML IJ SOLN
INTRAMUSCULAR | Status: DC | PRN
  Administered 2018-01-11: 500 mL

## 2018-01-11 MED ORDER — FENTANYL CITRATE (PF) 100 MCG/2ML IJ SOLN: 25.0000 ug | INTRAMUSCULAR | Status: DC | PRN

## 2018-01-11 MED ORDER — 0.9 % SODIUM CHLORIDE (POUR BTL) OPTIME
TOPICAL | Status: DC | PRN
  Administered 2018-01-11: 1000 mL

## 2018-01-11 MED ORDER — SODIUM CHLORIDE 0.9 % IV SOLN
INTRAVENOUS | Status: AC
  Filled 2018-01-11: qty 1.2

## 2018-01-11 MED ORDER — DEXTROSE 5 % IV SOLN
INTRAVENOUS | Status: DC | PRN
  Administered 2018-01-11: 10 ug/min via INTRAVENOUS

## 2018-01-11 MED ORDER — CEFAZOLIN SODIUM-DEXTROSE 2-4 GM/100ML-% IV SOLN
2.0000 g | INTRAVENOUS | Status: AC
  Administered 2018-01-11: 2 g via INTRAVENOUS
  Filled 2018-01-11: qty 100

## 2018-01-11 MED ORDER — EPHEDRINE SULFATE 50 MG/ML IJ SOLN
INTRAMUSCULAR | Status: AC
  Filled 2018-01-11: qty 1

## 2018-01-11 SURGICAL SUPPLY — 38 items
ARMBAND PINK RESTRICT EXTREMIT (MISCELLANEOUS) ×3 IMPLANT
BANDAGE ACE 6X5 VEL STRL LF (GAUZE/BANDAGES/DRESSINGS) ×3 IMPLANT
CANISTER SUCT 3000ML PPV (MISCELLANEOUS) ×3 IMPLANT
CANNULA VESSEL 3MM 2 BLNT TIP (CANNULA) ×3 IMPLANT
CLIP VESOCCLUDE MED 6/CT (CLIP) ×3 IMPLANT
CLIP VESOCCLUDE SM WIDE 6/CT (CLIP) ×3 IMPLANT
DECANTER SPIKE VIAL GLASS SM (MISCELLANEOUS) ×3 IMPLANT
DERMABOND ADVANCED (GAUZE/BANDAGES/DRESSINGS) ×2
DERMABOND ADVANCED .7 DNX12 (GAUZE/BANDAGES/DRESSINGS) ×1 IMPLANT
ELECT REM PT RETURN 9FT ADLT (ELECTROSURGICAL) ×3
ELECTRODE REM PT RTRN 9FT ADLT (ELECTROSURGICAL) ×1 IMPLANT
GLOVE BIO SURGEON STRL SZ 6.5 (GLOVE) ×2 IMPLANT
GLOVE BIO SURGEON STRL SZ7.5 (GLOVE) ×3 IMPLANT
GLOVE BIO SURGEONS STRL SZ 6.5 (GLOVE) ×1
GLOVE BIOGEL M 6.5 STRL (GLOVE) ×6 IMPLANT
GLOVE BIOGEL PI IND STRL 7.5 (GLOVE) ×1 IMPLANT
GLOVE BIOGEL PI IND STRL 8 (GLOVE) ×1 IMPLANT
GLOVE BIOGEL PI INDICATOR 7.5 (GLOVE) ×2
GLOVE BIOGEL PI INDICATOR 8 (GLOVE) ×2
GLOVE ECLIPSE 6.5 STRL STRAW (GLOVE) ×3 IMPLANT
GOWN STRL REUS W/ TWL LRG LVL3 (GOWN DISPOSABLE) ×3 IMPLANT
GOWN STRL REUS W/TWL LRG LVL3 (GOWN DISPOSABLE) ×6
GRAFT GORETEX STRT 4-7X45 (Vascular Products) ×3 IMPLANT
HEMOSTAT SNOW SURGICEL 2X4 (HEMOSTASIS) ×3 IMPLANT
KIT BASIN OR (CUSTOM PROCEDURE TRAY) ×3 IMPLANT
KIT TURNOVER KIT B (KITS) ×3 IMPLANT
NS IRRIG 1000ML POUR BTL (IV SOLUTION) ×3 IMPLANT
PACK CV ACCESS (CUSTOM PROCEDURE TRAY) ×3 IMPLANT
PAD ARMBOARD 7.5X6 YLW CONV (MISCELLANEOUS) ×6 IMPLANT
SPONGE SURGIFOAM ABS GEL 100 (HEMOSTASIS) IMPLANT
SUT PROLENE 6 0 BV (SUTURE) ×9 IMPLANT
SUT VIC AB 3-0 SH 27 (SUTURE) ×4
SUT VIC AB 3-0 SH 27X BRD (SUTURE) ×2 IMPLANT
SUT VICRYL 4-0 PS2 18IN ABS (SUTURE) ×6 IMPLANT
SYR TOOMEY 50ML (SYRINGE) IMPLANT
TOWEL GREEN STERILE (TOWEL DISPOSABLE) ×3 IMPLANT
UNDERPAD 30X30 (UNDERPADS AND DIAPERS) ×3 IMPLANT
WATER STERILE IRR 1000ML POUR (IV SOLUTION) ×3 IMPLANT

## 2018-01-11 NOTE — Progress Notes (Signed)
Patient ID: [name], female   DOB: [birth year], 42 y.o.   MRN: [id number] Patient is status post right upper arm loop AV Gore-Tex graft earlier today with Dr. Doren Custard.  I was called to the PACU with some concern regarding swelling.  Patient is having some nausea but no significant pain other than the typical.  Her axillary and antecubital incision are showed no evidence of bleeding.  She does have expected fullness over the graft where it was tunneled.  No evidence of hematoma at the axilla where both her arterial and venous anastomosis are placed.  She does have a 1-2+ palpable right radial pulse.  No evidence of significant bleed.  Continue plan for discharge to home post PACU

## 2018-01-11 NOTE — Op Note (Signed)
    NAME: [name]    MRN: [id number] DOB: 1976-02-27    DATE OF OPERATION: 01/11/2018  PREOP DIAGNOSIS:    End-stage renal disease   POSTOP DIAGNOSIS:    Same  PROCEDURE:    New right upper arm AV graft  SURGEON: Judeth Cornfield. Scot Dock, MD, FACS  ASSIST: Gaye Alken, RNFA  ANESTHESIA: General  EBL: Minimal  INDICATIONS:    [name] is a 42 y.o. female who presents for new access.  He had a first stage basilic vein transposition and the vein did not adequately increase in size.  FINDINGS:   Good thrill in right upper arm graft at the completion of the procedure.  Radial and ulnar signal with a Doppler.  TECHNIQUE:   The patient was taken to the operating room and received a general anesthetic.  The right arm was prepped and draped in usual sterile fashion.  A longitudinal incision was made beneath the axilla and here the brachial vein and adjacent brachial artery were dissected free.  Using 1 distal counterincision a 4-7 mm PTFE graft was tunneled in a loop fashion in the upper arm with the arterial aspect of the graft along the lateral aspect of the upper arm.  The patient was then heparinized.  The brachial artery was clamped proximally and distally and a longitudinal arteriotomy was made.  A short segment of the 4 mm of the graft was excised, the graft spatulated and sewn into side to the artery using continuous 6-0 Prolene suture.  Graft then pulled the appropriate length for anastomosis to the high brachial vein.  The vein was ligated distally and spatulated proximally.  The graft was cut the appropriate length spatulated and sewn into into the vein using 2 continuous 6-0 Prolene sutures.  At the completion there was a good thrill in the fistula.  The heparin was partially reversed with protamine.  Hemostasis was obtained in the wounds.  Each of the wounds was closed with a deep layer of 3-0 Vicryl and the skin closed with 4-0 Vicryl.  Dermabond was applied.   Patient tolerated the procedure well and was transferred to the recovery room in stable condition.  All needle and sponge counts were correct.  [name], MD, FACS [facility]  DATE OF DICTATION:   01/11/2018

## 2018-01-11 NOTE — Interval H&P Note (Signed)
History and Physical Interval Note:  01/11/2018 9:20 AM  [name]  has presented today for surgery, with the diagnosis of end stage renal disease  The various methods of treatment have been discussed with the patient and family. After consideration of risks, benefits and other options for treatment, the patient has consented to  Procedure(s): INSERTION OF ARTERIOVENOUS (AV) GORE-TEX GRAFT  UPPER ARM (Right) as a surgical intervention .  The patient's history has been reviewed, patient examined, no change in status, stable for surgery.  I have reviewed the patient's chart and labs.  Questions were answered to the patient's satisfaction.     [name]

## 2018-01-11 NOTE — Anesthesia Preprocedure Evaluation (Addendum)
Anesthesia Evaluation  Patient identified by MRN, date of birth, ID band Patient awake    Reviewed: Allergy & Precautions, NPO status , Patient's Chart, lab work & pertinent test results  Airway Mallampati: III  TM Distance: >3 FB Neck ROM: Full    Dental  (+) Teeth Intact, Dental Advisory Given   Pulmonary neg pulmonary ROS,    breath sounds clear to auscultation       Cardiovascular hypertension, Pt. on medications and Pt. on home beta blockers + Peripheral Vascular Disease   Rhythm:Regular Rate:Normal     Neuro/Psych CVA negative psych ROS   GI/Hepatic Neg liver ROS, GERD  ,  Endo/Other  diabetes, Type 1, Insulin Dependent  Renal/GU ESRFRenal disease     Musculoskeletal   Abdominal Normal abdominal exam  (+)   Peds  Hematology   Anesthesia Other Findings - HLD  Reproductive/Obstetrics                            Lab Results  Component Value Date   WBC 5.8 06/08/2016   HGB 12.6 01/11/2018   HCT 37.0 01/11/2018   MCV 83.1 06/08/2016   PLT 273 06/08/2016   Lab Results  Component Value Date   CREATININE 10.70 (H) 07/15/2016   BUN 48 (H) 07/15/2016   NA 139 01/11/2018   K 4.2 01/11/2018   CL 98 (L) 07/15/2016   CO2 27 06/08/2016   Lab Results  Component Value Date   INR 1.04 03/29/2014   EKG: NSR.   Anesthesia Physical Anesthesia Plan  ASA: III  Anesthesia Plan: General   Post-op Pain Management:    Induction: Intravenous  PONV Risk Score and Plan: 4 or greater and Ondansetron, Midazolam, Treatment may vary due to age or medical condition and Scopolamine patch - Pre-op  Airway Management Planned: Oral ETT  Additional Equipment: None  Intra-op Plan:   Post-operative Plan: Extubation in OR  Informed Consent: I have reviewed the patients History and Physical, chart, labs and discussed the procedure including the risks, benefits and alternatives for the proposed  anesthesia with the patient or authorized representative who has indicated his/her understanding and acceptance.   Dental advisory given  Plan Discussed with: CRNA  Anesthesia Plan Comments:       Anesthesia Quick Evaluation

## 2018-01-11 NOTE — Anesthesia Procedure Notes (Signed)
Procedure Name: Intubation Date/Time: 01/11/2018 9:57 AM Performed by: Leonor Liv, CRNA Pre-anesthesia Checklist: Patient identified, Emergency Drugs available, Suction available and Patient being monitored Patient Re-evaluated:Patient Re-evaluated prior to induction Oxygen Delivery Method: Circle System Utilized Preoxygenation: Pre-oxygenation with 100% oxygen Induction Type: IV induction Ventilation: Mask ventilation without difficulty Laryngoscope Size: Mac and 3 Grade View: Grade I Tube type: Oral Tube size: 7.0 mm Number of attempts: 1 Airway Equipment and Method: Stylet and Oral airway Placement Confirmation: ETT inserted through vocal cords under direct vision,  positive ETCO2 and breath sounds checked- equal and bilateral Secured at: 22 cm Tube secured with: Tape Dental Injury: Teeth and Oropharynx as per pre-operative assessment

## 2018-01-11 NOTE — Transfer of Care (Signed)
Immediate Anesthesia Transfer of Care Note  Patient: [name]  Procedure(s) Performed: INSERTION OF ARTERIOVENOUS (AV) GORE-TEX GRAFT  UPPER ARM (Right )  Patient Location: PACU  Anesthesia Type:General  Level of Consciousness: sedated  Airway & Oxygen Therapy: Patient Spontanous Breathing and Patient connected to nasal cannula oxygen  Post-op Assessment: Report given to RN and Post -op Vital signs reviewed and stable  Post vital signs: Reviewed and stable  Last Vitals:  Vitals Value Taken Time  BP 141/74 01/11/2018 12:03 PM  Temp    Pulse 76 01/11/2018 12:08 PM  Resp 20 01/11/2018 12:08 PM  SpO2 97 % 01/11/2018 12:08 PM  Vitals shown include unvalidated device data.  Last Pain:  Vitals:   01/11/18 0753  TempSrc:   PainSc: 0-No pain      Patients Stated Pain Goal: 4 (83/16/74 2552)  Complications: No apparent anesthesia complications

## 2018-01-11 NOTE — Anesthesia Postprocedure Evaluation (Signed)
Anesthesia Post Note  Patient: [name]  Procedure(s) Performed: INSERTION OF ARTERIOVENOUS (AV) GORE-TEX GRAFT  UPPER ARM (Right )     Patient location during evaluation: PACU Anesthesia Type: General Level of consciousness: awake and alert Pain management: pain level controlled Vital Signs Assessment: post-procedure vital signs reviewed and stable Respiratory status: spontaneous breathing, nonlabored ventilation, respiratory function stable and patient connected to nasal cannula oxygen Cardiovascular status: blood pressure returned to baseline and stable Postop Assessment: no apparent nausea or vomiting Anesthetic complications: no Comments: Nausea improved. Pt administered insulin bolus via personal pump. Compression wrap applied to RUE.     Last Vitals:  Vitals:   01/11/18 1405 01/11/18 1420  BP: (!) 148/79 (!) 147/79  Pulse: 74 73  Resp: 17 18  Temp: 36.7 C   SpO2: 92% 91%    Last Pain:  Vitals:   01/11/18 1320  TempSrc:   PainSc: Asleep                 Effie Berkshire

## 2018-01-11 NOTE — Discharge Instructions (Addendum)
Keep ace wrap on for 48 hours. Remove on Wednesday 6/26.

## 2018-01-12 ENCOUNTER — Encounter (HOSPITAL_COMMUNITY): Payer: Self-pay | Admitting: Vascular Surgery

## 2018-01-12 DIAGNOSIS — E1029 Type 1 diabetes mellitus with other diabetic kidney complication: Secondary | ICD-10-CM | POA: Diagnosis not present

## 2018-01-12 DIAGNOSIS — D631 Anemia in chronic kidney disease: Secondary | ICD-10-CM | POA: Diagnosis not present

## 2018-01-12 DIAGNOSIS — N2581 Secondary hyperparathyroidism of renal origin: Secondary | ICD-10-CM | POA: Diagnosis not present

## 2018-01-12 DIAGNOSIS — N186 End stage renal disease: Secondary | ICD-10-CM | POA: Diagnosis not present

## 2018-01-14 DIAGNOSIS — D631 Anemia in chronic kidney disease: Secondary | ICD-10-CM | POA: Diagnosis not present

## 2018-01-14 DIAGNOSIS — N186 End stage renal disease: Secondary | ICD-10-CM | POA: Diagnosis not present

## 2018-01-14 DIAGNOSIS — E1029 Type 1 diabetes mellitus with other diabetic kidney complication: Secondary | ICD-10-CM | POA: Diagnosis not present

## 2018-01-14 DIAGNOSIS — N2581 Secondary hyperparathyroidism of renal origin: Secondary | ICD-10-CM | POA: Diagnosis not present

## 2018-01-16 DIAGNOSIS — E1029 Type 1 diabetes mellitus with other diabetic kidney complication: Secondary | ICD-10-CM | POA: Diagnosis not present

## 2018-01-16 DIAGNOSIS — N186 End stage renal disease: Secondary | ICD-10-CM | POA: Diagnosis not present

## 2018-01-16 DIAGNOSIS — N2581 Secondary hyperparathyroidism of renal origin: Secondary | ICD-10-CM | POA: Diagnosis not present

## 2018-01-16 DIAGNOSIS — D631 Anemia in chronic kidney disease: Secondary | ICD-10-CM | POA: Diagnosis not present

## 2018-01-18 DIAGNOSIS — N186 End stage renal disease: Secondary | ICD-10-CM | POA: Diagnosis not present

## 2018-01-18 DIAGNOSIS — E1029 Type 1 diabetes mellitus with other diabetic kidney complication: Secondary | ICD-10-CM | POA: Diagnosis not present

## 2018-01-18 DIAGNOSIS — Z992 Dependence on renal dialysis: Secondary | ICD-10-CM | POA: Diagnosis not present

## 2018-01-19 DIAGNOSIS — E1029 Type 1 diabetes mellitus with other diabetic kidney complication: Secondary | ICD-10-CM | POA: Diagnosis not present

## 2018-01-19 DIAGNOSIS — N2581 Secondary hyperparathyroidism of renal origin: Secondary | ICD-10-CM | POA: Diagnosis not present

## 2018-01-19 DIAGNOSIS — D631 Anemia in chronic kidney disease: Secondary | ICD-10-CM | POA: Diagnosis not present

## 2018-01-19 DIAGNOSIS — D509 Iron deficiency anemia, unspecified: Secondary | ICD-10-CM | POA: Diagnosis not present

## 2018-01-19 DIAGNOSIS — N186 End stage renal disease: Secondary | ICD-10-CM | POA: Diagnosis not present

## 2018-01-21 DIAGNOSIS — N186 End stage renal disease: Secondary | ICD-10-CM | POA: Diagnosis not present

## 2018-01-21 DIAGNOSIS — D509 Iron deficiency anemia, unspecified: Secondary | ICD-10-CM | POA: Diagnosis not present

## 2018-01-21 DIAGNOSIS — E1029 Type 1 diabetes mellitus with other diabetic kidney complication: Secondary | ICD-10-CM | POA: Diagnosis not present

## 2018-01-21 DIAGNOSIS — N2581 Secondary hyperparathyroidism of renal origin: Secondary | ICD-10-CM | POA: Diagnosis not present

## 2018-01-21 DIAGNOSIS — D631 Anemia in chronic kidney disease: Secondary | ICD-10-CM | POA: Diagnosis not present

## 2018-01-23 DIAGNOSIS — N2581 Secondary hyperparathyroidism of renal origin: Secondary | ICD-10-CM | POA: Diagnosis not present

## 2018-01-23 DIAGNOSIS — D631 Anemia in chronic kidney disease: Secondary | ICD-10-CM | POA: Diagnosis not present

## 2018-01-23 DIAGNOSIS — D509 Iron deficiency anemia, unspecified: Secondary | ICD-10-CM | POA: Diagnosis not present

## 2018-01-23 DIAGNOSIS — E1029 Type 1 diabetes mellitus with other diabetic kidney complication: Secondary | ICD-10-CM | POA: Diagnosis not present

## 2018-01-23 DIAGNOSIS — N186 End stage renal disease: Secondary | ICD-10-CM | POA: Diagnosis not present

## 2018-01-26 DIAGNOSIS — D631 Anemia in chronic kidney disease: Secondary | ICD-10-CM | POA: Diagnosis not present

## 2018-01-26 DIAGNOSIS — N2581 Secondary hyperparathyroidism of renal origin: Secondary | ICD-10-CM | POA: Diagnosis not present

## 2018-01-26 DIAGNOSIS — N186 End stage renal disease: Secondary | ICD-10-CM | POA: Diagnosis not present

## 2018-01-26 DIAGNOSIS — E1029 Type 1 diabetes mellitus with other diabetic kidney complication: Secondary | ICD-10-CM | POA: Diagnosis not present

## 2018-01-26 DIAGNOSIS — D509 Iron deficiency anemia, unspecified: Secondary | ICD-10-CM | POA: Diagnosis not present

## 2018-01-28 DIAGNOSIS — N186 End stage renal disease: Secondary | ICD-10-CM | POA: Diagnosis not present

## 2018-01-28 DIAGNOSIS — E1029 Type 1 diabetes mellitus with other diabetic kidney complication: Secondary | ICD-10-CM | POA: Diagnosis not present

## 2018-01-28 DIAGNOSIS — D509 Iron deficiency anemia, unspecified: Secondary | ICD-10-CM | POA: Diagnosis not present

## 2018-01-28 DIAGNOSIS — N2581 Secondary hyperparathyroidism of renal origin: Secondary | ICD-10-CM | POA: Diagnosis not present

## 2018-01-28 DIAGNOSIS — D631 Anemia in chronic kidney disease: Secondary | ICD-10-CM | POA: Diagnosis not present

## 2018-01-30 DIAGNOSIS — N2581 Secondary hyperparathyroidism of renal origin: Secondary | ICD-10-CM | POA: Diagnosis not present

## 2018-01-30 DIAGNOSIS — D631 Anemia in chronic kidney disease: Secondary | ICD-10-CM | POA: Diagnosis not present

## 2018-01-30 DIAGNOSIS — N186 End stage renal disease: Secondary | ICD-10-CM | POA: Diagnosis not present

## 2018-01-30 DIAGNOSIS — D509 Iron deficiency anemia, unspecified: Secondary | ICD-10-CM | POA: Diagnosis not present

## 2018-01-30 DIAGNOSIS — E1029 Type 1 diabetes mellitus with other diabetic kidney complication: Secondary | ICD-10-CM | POA: Diagnosis not present

## 2018-02-02 DIAGNOSIS — N186 End stage renal disease: Secondary | ICD-10-CM | POA: Diagnosis not present

## 2018-02-02 DIAGNOSIS — N2581 Secondary hyperparathyroidism of renal origin: Secondary | ICD-10-CM | POA: Diagnosis not present

## 2018-02-02 DIAGNOSIS — D631 Anemia in chronic kidney disease: Secondary | ICD-10-CM | POA: Diagnosis not present

## 2018-02-02 DIAGNOSIS — E1029 Type 1 diabetes mellitus with other diabetic kidney complication: Secondary | ICD-10-CM | POA: Diagnosis not present

## 2018-02-02 DIAGNOSIS — D509 Iron deficiency anemia, unspecified: Secondary | ICD-10-CM | POA: Diagnosis not present

## 2018-02-04 DIAGNOSIS — D631 Anemia in chronic kidney disease: Secondary | ICD-10-CM | POA: Diagnosis not present

## 2018-02-04 DIAGNOSIS — D509 Iron deficiency anemia, unspecified: Secondary | ICD-10-CM | POA: Diagnosis not present

## 2018-02-04 DIAGNOSIS — N186 End stage renal disease: Secondary | ICD-10-CM | POA: Diagnosis not present

## 2018-02-04 DIAGNOSIS — E1029 Type 1 diabetes mellitus with other diabetic kidney complication: Secondary | ICD-10-CM | POA: Diagnosis not present

## 2018-02-04 DIAGNOSIS — N2581 Secondary hyperparathyroidism of renal origin: Secondary | ICD-10-CM | POA: Diagnosis not present

## 2018-02-06 DIAGNOSIS — N2581 Secondary hyperparathyroidism of renal origin: Secondary | ICD-10-CM | POA: Diagnosis not present

## 2018-02-06 DIAGNOSIS — N186 End stage renal disease: Secondary | ICD-10-CM | POA: Diagnosis not present

## 2018-02-06 DIAGNOSIS — D631 Anemia in chronic kidney disease: Secondary | ICD-10-CM | POA: Diagnosis not present

## 2018-02-06 DIAGNOSIS — E1029 Type 1 diabetes mellitus with other diabetic kidney complication: Secondary | ICD-10-CM | POA: Diagnosis not present

## 2018-02-06 DIAGNOSIS — D509 Iron deficiency anemia, unspecified: Secondary | ICD-10-CM | POA: Diagnosis not present

## 2018-02-09 DIAGNOSIS — N2581 Secondary hyperparathyroidism of renal origin: Secondary | ICD-10-CM | POA: Diagnosis not present

## 2018-02-09 DIAGNOSIS — D509 Iron deficiency anemia, unspecified: Secondary | ICD-10-CM | POA: Diagnosis not present

## 2018-02-09 DIAGNOSIS — N186 End stage renal disease: Secondary | ICD-10-CM | POA: Diagnosis not present

## 2018-02-09 DIAGNOSIS — E1029 Type 1 diabetes mellitus with other diabetic kidney complication: Secondary | ICD-10-CM | POA: Diagnosis not present

## 2018-02-09 DIAGNOSIS — D631 Anemia in chronic kidney disease: Secondary | ICD-10-CM | POA: Diagnosis not present

## 2018-02-11 DIAGNOSIS — N2581 Secondary hyperparathyroidism of renal origin: Secondary | ICD-10-CM | POA: Diagnosis not present

## 2018-02-11 DIAGNOSIS — N186 End stage renal disease: Secondary | ICD-10-CM | POA: Diagnosis not present

## 2018-02-11 DIAGNOSIS — E1029 Type 1 diabetes mellitus with other diabetic kidney complication: Secondary | ICD-10-CM | POA: Diagnosis not present

## 2018-02-11 DIAGNOSIS — D509 Iron deficiency anemia, unspecified: Secondary | ICD-10-CM | POA: Diagnosis not present

## 2018-02-11 DIAGNOSIS — D631 Anemia in chronic kidney disease: Secondary | ICD-10-CM | POA: Diagnosis not present

## 2018-02-13 DIAGNOSIS — N2581 Secondary hyperparathyroidism of renal origin: Secondary | ICD-10-CM | POA: Diagnosis not present

## 2018-02-13 DIAGNOSIS — N186 End stage renal disease: Secondary | ICD-10-CM | POA: Diagnosis not present

## 2018-02-13 DIAGNOSIS — E1029 Type 1 diabetes mellitus with other diabetic kidney complication: Secondary | ICD-10-CM | POA: Diagnosis not present

## 2018-02-13 DIAGNOSIS — D631 Anemia in chronic kidney disease: Secondary | ICD-10-CM | POA: Diagnosis not present

## 2018-02-13 DIAGNOSIS — D509 Iron deficiency anemia, unspecified: Secondary | ICD-10-CM | POA: Diagnosis not present

## 2018-02-16 DIAGNOSIS — N2581 Secondary hyperparathyroidism of renal origin: Secondary | ICD-10-CM | POA: Diagnosis not present

## 2018-02-16 DIAGNOSIS — D509 Iron deficiency anemia, unspecified: Secondary | ICD-10-CM | POA: Diagnosis not present

## 2018-02-16 DIAGNOSIS — D631 Anemia in chronic kidney disease: Secondary | ICD-10-CM | POA: Diagnosis not present

## 2018-02-16 DIAGNOSIS — N186 End stage renal disease: Secondary | ICD-10-CM | POA: Diagnosis not present

## 2018-02-16 DIAGNOSIS — E1029 Type 1 diabetes mellitus with other diabetic kidney complication: Secondary | ICD-10-CM | POA: Diagnosis not present

## 2018-02-17 ENCOUNTER — Ambulatory Visit (INDEPENDENT_AMBULATORY_CARE_PROVIDER_SITE_OTHER): Payer: Self-pay | Admitting: Physician Assistant

## 2018-02-17 ENCOUNTER — Encounter: Payer: Self-pay | Admitting: Physician Assistant

## 2018-02-17 VITALS — BP 162/101 | HR 78 | Temp 98.0°F | Resp 16 | Ht 63.0 in | Wt 122.9 lb

## 2018-02-17 DIAGNOSIS — Z992 Dependence on renal dialysis: Secondary | ICD-10-CM

## 2018-02-17 DIAGNOSIS — E103593 Type 1 diabetes mellitus with proliferative diabetic retinopathy without macular edema, bilateral: Secondary | ICD-10-CM | POA: Diagnosis not present

## 2018-02-17 DIAGNOSIS — N186 End stage renal disease: Secondary | ICD-10-CM

## 2018-02-17 DIAGNOSIS — E1022 Type 1 diabetes mellitus with diabetic chronic kidney disease: Secondary | ICD-10-CM | POA: Diagnosis not present

## 2018-02-17 NOTE — Progress Notes (Signed)
POST OPERATIVE OFFICE NOTE    CC:  F/u for surgery  HPI:  This is a 42 y.o. female who is s/p right UE av graft 01/11/2018.  She has a left upper arm loop AV graft which I most recently revised a year ago in January 2018.  I felt that there would be no further revisions possible surgically on this graft.  The flows have dropped and we have been asked to place access in the right arm while they continue to use her graft in the left arm.  In this way we might be able to avoid a catheter.  She is here today for a post op visit.  She denise pain and loss of motor.  She does report times of tingling in the right UE. that don't bother her daily activities. There hav not been any changes in her medical history since her last visit.  Allergies  Allergen Reactions  . Pollen Extract Other (See Comments)    Watery eyes    Current Outpatient Medications  Medication Sig Dispense Refill  . amLODipine (NORVASC) 10 MG tablet Take 10 mg by mouth at bedtime.     Marland Kitchen aspirin 81 MG EC tablet Take 81 mg by mouth at bedtime. Swallow whole.     . calcium acetate (PHOSLO) 667 MG capsule Take 667 mg by mouth 3 (three) times daily with meals. May take an additional 667 mg dose with snacks    . cinacalcet (SENSIPAR) 30 MG tablet Take 30 mg by mouth every evening.     . ethyl chloride spray USE TOPICAL PRIOR TO PORT ACCESS  3  . GLUCAGON EMERGENCY 1 MG injection Inject 1 mg into the muscle once as needed (for severe low blood sugar).     . insulin aspart (NOVOLOG) 100 UNIT/ML injection Use as directed in insulin pump.  MDD: 100 units/day    . Insulin Human (INSULIN PUMP) SOLN Inject into the skin continuous. Novolog   Basal rate 0.29 units/ hour    . labetalol (NORMODYNE) 300 MG tablet Take 300 mg by mouth 3 (three) times daily with meals.     Marland Kitchen loratadine (CLARITIN) 10 MG tablet Take 10 mg by mouth daily as needed for allergies.    . Pediatric Multiple Vit-C-FA (FLINSTONES GUMMIES OMEGA-3 DHA) CHEW Chew 2 tablets by  mouth at bedtime.     . rosuvastatin (CRESTOR) 10 MG tablet Take 10 mg by mouth at bedtime.     Marland Kitchen oxyCODONE (ROXICODONE) 5 MG immediate release tablet Take 1 tablet (5 mg total) by mouth every 4 (four) hours as needed for severe pain. (Patient not taking: Reported on 02/17/2018) 15 tablet 0   No current facility-administered medications for this visit.      ROS:  See HPI  Physical Exam:  Vitals:   02/17/18 1542  BP: (!) 162/101  Pulse: 78  Resp: 16  Temp: 98 F (36.7 C)  SpO2: 99%    Incision:  Well healed, no erythema or edema in the right UE Extremities:  Palpable thrill in the right AV graft, palpable radial pulse with grip 5/5 equal B. Heart : RRR Lungs:  Non labored breathing, CTA  Assessment/Plan:  This is a 42 y.o. female who is s/p: Right UE AV graft  It has been 4 weeks since her graft placement on 01/11/2018.  The graft may be used for HD.  She was asking if the left arm fistula can be removed.  It has been working for more than  10 years.  I advised her to leave it be unless it bothers her for some reason.  She will for now.    F/U PRN if she has problems or concerns she will call.   Roxy Horseman , PA-C [facility] [phone]

## 2018-02-18 DIAGNOSIS — E1029 Type 1 diabetes mellitus with other diabetic kidney complication: Secondary | ICD-10-CM | POA: Diagnosis not present

## 2018-02-18 DIAGNOSIS — Z992 Dependence on renal dialysis: Secondary | ICD-10-CM | POA: Diagnosis not present

## 2018-02-18 DIAGNOSIS — N186 End stage renal disease: Secondary | ICD-10-CM | POA: Diagnosis not present

## 2018-02-18 DIAGNOSIS — N2581 Secondary hyperparathyroidism of renal origin: Secondary | ICD-10-CM | POA: Diagnosis not present

## 2018-02-18 DIAGNOSIS — R739 Hyperglycemia, unspecified: Secondary | ICD-10-CM | POA: Diagnosis not present

## 2018-02-18 DIAGNOSIS — D509 Iron deficiency anemia, unspecified: Secondary | ICD-10-CM | POA: Diagnosis not present

## 2018-02-18 DIAGNOSIS — D631 Anemia in chronic kidney disease: Secondary | ICD-10-CM | POA: Diagnosis not present

## 2018-02-20 DIAGNOSIS — D509 Iron deficiency anemia, unspecified: Secondary | ICD-10-CM | POA: Diagnosis not present

## 2018-02-20 DIAGNOSIS — R739 Hyperglycemia, unspecified: Secondary | ICD-10-CM | POA: Diagnosis not present

## 2018-02-20 DIAGNOSIS — N2581 Secondary hyperparathyroidism of renal origin: Secondary | ICD-10-CM | POA: Diagnosis not present

## 2018-02-20 DIAGNOSIS — D631 Anemia in chronic kidney disease: Secondary | ICD-10-CM | POA: Diagnosis not present

## 2018-02-20 DIAGNOSIS — N186 End stage renal disease: Secondary | ICD-10-CM | POA: Diagnosis not present

## 2018-02-20 DIAGNOSIS — E1029 Type 1 diabetes mellitus with other diabetic kidney complication: Secondary | ICD-10-CM | POA: Diagnosis not present

## 2018-02-23 DIAGNOSIS — D631 Anemia in chronic kidney disease: Secondary | ICD-10-CM | POA: Diagnosis not present

## 2018-02-23 DIAGNOSIS — N2581 Secondary hyperparathyroidism of renal origin: Secondary | ICD-10-CM | POA: Diagnosis not present

## 2018-02-23 DIAGNOSIS — R739 Hyperglycemia, unspecified: Secondary | ICD-10-CM | POA: Diagnosis not present

## 2018-02-23 DIAGNOSIS — E1029 Type 1 diabetes mellitus with other diabetic kidney complication: Secondary | ICD-10-CM | POA: Diagnosis not present

## 2018-02-23 DIAGNOSIS — D509 Iron deficiency anemia, unspecified: Secondary | ICD-10-CM | POA: Diagnosis not present

## 2018-02-23 DIAGNOSIS — N186 End stage renal disease: Secondary | ICD-10-CM | POA: Diagnosis not present

## 2018-02-25 DIAGNOSIS — D509 Iron deficiency anemia, unspecified: Secondary | ICD-10-CM | POA: Diagnosis not present

## 2018-02-25 DIAGNOSIS — E1029 Type 1 diabetes mellitus with other diabetic kidney complication: Secondary | ICD-10-CM | POA: Diagnosis not present

## 2018-02-25 DIAGNOSIS — R739 Hyperglycemia, unspecified: Secondary | ICD-10-CM | POA: Diagnosis not present

## 2018-02-25 DIAGNOSIS — N2581 Secondary hyperparathyroidism of renal origin: Secondary | ICD-10-CM | POA: Diagnosis not present

## 2018-02-25 DIAGNOSIS — N186 End stage renal disease: Secondary | ICD-10-CM | POA: Diagnosis not present

## 2018-02-25 DIAGNOSIS — D631 Anemia in chronic kidney disease: Secondary | ICD-10-CM | POA: Diagnosis not present

## 2018-02-27 DIAGNOSIS — N186 End stage renal disease: Secondary | ICD-10-CM | POA: Diagnosis not present

## 2018-02-27 DIAGNOSIS — N2581 Secondary hyperparathyroidism of renal origin: Secondary | ICD-10-CM | POA: Diagnosis not present

## 2018-02-27 DIAGNOSIS — R739 Hyperglycemia, unspecified: Secondary | ICD-10-CM | POA: Diagnosis not present

## 2018-02-27 DIAGNOSIS — D509 Iron deficiency anemia, unspecified: Secondary | ICD-10-CM | POA: Diagnosis not present

## 2018-02-27 DIAGNOSIS — E1029 Type 1 diabetes mellitus with other diabetic kidney complication: Secondary | ICD-10-CM | POA: Diagnosis not present

## 2018-02-27 DIAGNOSIS — D631 Anemia in chronic kidney disease: Secondary | ICD-10-CM | POA: Diagnosis not present

## 2018-03-02 DIAGNOSIS — R739 Hyperglycemia, unspecified: Secondary | ICD-10-CM | POA: Diagnosis not present

## 2018-03-02 DIAGNOSIS — E1029 Type 1 diabetes mellitus with other diabetic kidney complication: Secondary | ICD-10-CM | POA: Diagnosis not present

## 2018-03-02 DIAGNOSIS — N2581 Secondary hyperparathyroidism of renal origin: Secondary | ICD-10-CM | POA: Diagnosis not present

## 2018-03-02 DIAGNOSIS — D631 Anemia in chronic kidney disease: Secondary | ICD-10-CM | POA: Diagnosis not present

## 2018-03-02 DIAGNOSIS — N186 End stage renal disease: Secondary | ICD-10-CM | POA: Diagnosis not present

## 2018-03-02 DIAGNOSIS — D509 Iron deficiency anemia, unspecified: Secondary | ICD-10-CM | POA: Diagnosis not present

## 2018-03-04 DIAGNOSIS — N2581 Secondary hyperparathyroidism of renal origin: Secondary | ICD-10-CM | POA: Diagnosis not present

## 2018-03-04 DIAGNOSIS — D631 Anemia in chronic kidney disease: Secondary | ICD-10-CM | POA: Diagnosis not present

## 2018-03-04 DIAGNOSIS — D509 Iron deficiency anemia, unspecified: Secondary | ICD-10-CM | POA: Diagnosis not present

## 2018-03-04 DIAGNOSIS — E1029 Type 1 diabetes mellitus with other diabetic kidney complication: Secondary | ICD-10-CM | POA: Diagnosis not present

## 2018-03-04 DIAGNOSIS — R739 Hyperglycemia, unspecified: Secondary | ICD-10-CM | POA: Diagnosis not present

## 2018-03-04 DIAGNOSIS — N186 End stage renal disease: Secondary | ICD-10-CM | POA: Diagnosis not present

## 2018-03-06 DIAGNOSIS — E1029 Type 1 diabetes mellitus with other diabetic kidney complication: Secondary | ICD-10-CM | POA: Diagnosis not present

## 2018-03-06 DIAGNOSIS — N186 End stage renal disease: Secondary | ICD-10-CM | POA: Diagnosis not present

## 2018-03-06 DIAGNOSIS — R739 Hyperglycemia, unspecified: Secondary | ICD-10-CM | POA: Diagnosis not present

## 2018-03-06 DIAGNOSIS — N2581 Secondary hyperparathyroidism of renal origin: Secondary | ICD-10-CM | POA: Diagnosis not present

## 2018-03-06 DIAGNOSIS — D631 Anemia in chronic kidney disease: Secondary | ICD-10-CM | POA: Diagnosis not present

## 2018-03-06 DIAGNOSIS — D509 Iron deficiency anemia, unspecified: Secondary | ICD-10-CM | POA: Diagnosis not present

## 2018-03-08 DIAGNOSIS — I10 Essential (primary) hypertension: Secondary | ICD-10-CM | POA: Diagnosis not present

## 2018-03-08 DIAGNOSIS — E785 Hyperlipidemia, unspecified: Secondary | ICD-10-CM | POA: Diagnosis not present

## 2018-03-08 DIAGNOSIS — I119 Hypertensive heart disease without heart failure: Secondary | ICD-10-CM | POA: Diagnosis not present

## 2018-03-08 DIAGNOSIS — Z992 Dependence on renal dialysis: Secondary | ICD-10-CM | POA: Diagnosis not present

## 2018-03-08 DIAGNOSIS — E538 Deficiency of other specified B group vitamins: Secondary | ICD-10-CM | POA: Diagnosis not present

## 2018-03-08 DIAGNOSIS — Z0000 Encounter for general adult medical examination without abnormal findings: Secondary | ICD-10-CM | POA: Diagnosis not present

## 2018-03-08 DIAGNOSIS — N186 End stage renal disease: Secondary | ICD-10-CM | POA: Diagnosis not present

## 2018-03-08 DIAGNOSIS — E119 Type 2 diabetes mellitus without complications: Secondary | ICD-10-CM | POA: Diagnosis not present

## 2018-03-08 DIAGNOSIS — D638 Anemia in other chronic diseases classified elsewhere: Secondary | ICD-10-CM | POA: Diagnosis not present

## 2018-03-09 DIAGNOSIS — D509 Iron deficiency anemia, unspecified: Secondary | ICD-10-CM | POA: Diagnosis not present

## 2018-03-09 DIAGNOSIS — D631 Anemia in chronic kidney disease: Secondary | ICD-10-CM | POA: Diagnosis not present

## 2018-03-09 DIAGNOSIS — E1029 Type 1 diabetes mellitus with other diabetic kidney complication: Secondary | ICD-10-CM | POA: Diagnosis not present

## 2018-03-09 DIAGNOSIS — N2581 Secondary hyperparathyroidism of renal origin: Secondary | ICD-10-CM | POA: Diagnosis not present

## 2018-03-09 DIAGNOSIS — R739 Hyperglycemia, unspecified: Secondary | ICD-10-CM | POA: Diagnosis not present

## 2018-03-09 DIAGNOSIS — N186 End stage renal disease: Secondary | ICD-10-CM | POA: Diagnosis not present

## 2018-03-11 DIAGNOSIS — D509 Iron deficiency anemia, unspecified: Secondary | ICD-10-CM | POA: Diagnosis not present

## 2018-03-11 DIAGNOSIS — D631 Anemia in chronic kidney disease: Secondary | ICD-10-CM | POA: Diagnosis not present

## 2018-03-11 DIAGNOSIS — R739 Hyperglycemia, unspecified: Secondary | ICD-10-CM | POA: Diagnosis not present

## 2018-03-11 DIAGNOSIS — N2581 Secondary hyperparathyroidism of renal origin: Secondary | ICD-10-CM | POA: Diagnosis not present

## 2018-03-11 DIAGNOSIS — N186 End stage renal disease: Secondary | ICD-10-CM | POA: Diagnosis not present

## 2018-03-11 DIAGNOSIS — E1029 Type 1 diabetes mellitus with other diabetic kidney complication: Secondary | ICD-10-CM | POA: Diagnosis not present

## 2018-03-13 DIAGNOSIS — N2581 Secondary hyperparathyroidism of renal origin: Secondary | ICD-10-CM | POA: Diagnosis not present

## 2018-03-13 DIAGNOSIS — D631 Anemia in chronic kidney disease: Secondary | ICD-10-CM | POA: Diagnosis not present

## 2018-03-13 DIAGNOSIS — E1029 Type 1 diabetes mellitus with other diabetic kidney complication: Secondary | ICD-10-CM | POA: Diagnosis not present

## 2018-03-13 DIAGNOSIS — D509 Iron deficiency anemia, unspecified: Secondary | ICD-10-CM | POA: Diagnosis not present

## 2018-03-13 DIAGNOSIS — N186 End stage renal disease: Secondary | ICD-10-CM | POA: Diagnosis not present

## 2018-03-13 DIAGNOSIS — R739 Hyperglycemia, unspecified: Secondary | ICD-10-CM | POA: Diagnosis not present

## 2018-03-16 DIAGNOSIS — N2581 Secondary hyperparathyroidism of renal origin: Secondary | ICD-10-CM | POA: Diagnosis not present

## 2018-03-16 DIAGNOSIS — R739 Hyperglycemia, unspecified: Secondary | ICD-10-CM | POA: Diagnosis not present

## 2018-03-16 DIAGNOSIS — D631 Anemia in chronic kidney disease: Secondary | ICD-10-CM | POA: Diagnosis not present

## 2018-03-16 DIAGNOSIS — D509 Iron deficiency anemia, unspecified: Secondary | ICD-10-CM | POA: Diagnosis not present

## 2018-03-16 DIAGNOSIS — E1029 Type 1 diabetes mellitus with other diabetic kidney complication: Secondary | ICD-10-CM | POA: Diagnosis not present

## 2018-03-16 DIAGNOSIS — N186 End stage renal disease: Secondary | ICD-10-CM | POA: Diagnosis not present

## 2018-03-18 DIAGNOSIS — D631 Anemia in chronic kidney disease: Secondary | ICD-10-CM | POA: Diagnosis not present

## 2018-03-18 DIAGNOSIS — R739 Hyperglycemia, unspecified: Secondary | ICD-10-CM | POA: Diagnosis not present

## 2018-03-18 DIAGNOSIS — N2581 Secondary hyperparathyroidism of renal origin: Secondary | ICD-10-CM | POA: Diagnosis not present

## 2018-03-18 DIAGNOSIS — N186 End stage renal disease: Secondary | ICD-10-CM | POA: Diagnosis not present

## 2018-03-18 DIAGNOSIS — D509 Iron deficiency anemia, unspecified: Secondary | ICD-10-CM | POA: Diagnosis not present

## 2018-03-18 DIAGNOSIS — E1029 Type 1 diabetes mellitus with other diabetic kidney complication: Secondary | ICD-10-CM | POA: Diagnosis not present

## 2018-03-20 DIAGNOSIS — N186 End stage renal disease: Secondary | ICD-10-CM | POA: Diagnosis not present

## 2018-03-20 DIAGNOSIS — N2581 Secondary hyperparathyroidism of renal origin: Secondary | ICD-10-CM | POA: Diagnosis not present

## 2018-03-20 DIAGNOSIS — R739 Hyperglycemia, unspecified: Secondary | ICD-10-CM | POA: Diagnosis not present

## 2018-03-20 DIAGNOSIS — D509 Iron deficiency anemia, unspecified: Secondary | ICD-10-CM | POA: Diagnosis not present

## 2018-03-20 DIAGNOSIS — E1029 Type 1 diabetes mellitus with other diabetic kidney complication: Secondary | ICD-10-CM | POA: Diagnosis not present

## 2018-03-20 DIAGNOSIS — D631 Anemia in chronic kidney disease: Secondary | ICD-10-CM | POA: Diagnosis not present

## 2018-03-21 DIAGNOSIS — N186 End stage renal disease: Secondary | ICD-10-CM | POA: Diagnosis not present

## 2018-03-21 DIAGNOSIS — E1029 Type 1 diabetes mellitus with other diabetic kidney complication: Secondary | ICD-10-CM | POA: Diagnosis not present

## 2018-03-21 DIAGNOSIS — Z992 Dependence on renal dialysis: Secondary | ICD-10-CM | POA: Diagnosis not present

## 2018-03-23 DIAGNOSIS — N186 End stage renal disease: Secondary | ICD-10-CM | POA: Diagnosis not present

## 2018-03-23 DIAGNOSIS — D631 Anemia in chronic kidney disease: Secondary | ICD-10-CM | POA: Diagnosis not present

## 2018-03-23 DIAGNOSIS — E1029 Type 1 diabetes mellitus with other diabetic kidney complication: Secondary | ICD-10-CM | POA: Diagnosis not present

## 2018-03-23 DIAGNOSIS — N2581 Secondary hyperparathyroidism of renal origin: Secondary | ICD-10-CM | POA: Diagnosis not present

## 2018-03-23 DIAGNOSIS — Z23 Encounter for immunization: Secondary | ICD-10-CM | POA: Diagnosis not present

## 2018-03-25 DIAGNOSIS — N186 End stage renal disease: Secondary | ICD-10-CM | POA: Diagnosis not present

## 2018-03-25 DIAGNOSIS — D631 Anemia in chronic kidney disease: Secondary | ICD-10-CM | POA: Diagnosis not present

## 2018-03-25 DIAGNOSIS — N2581 Secondary hyperparathyroidism of renal origin: Secondary | ICD-10-CM | POA: Diagnosis not present

## 2018-03-25 DIAGNOSIS — E1029 Type 1 diabetes mellitus with other diabetic kidney complication: Secondary | ICD-10-CM | POA: Diagnosis not present

## 2018-03-25 DIAGNOSIS — Z23 Encounter for immunization: Secondary | ICD-10-CM | POA: Diagnosis not present

## 2018-03-27 DIAGNOSIS — D631 Anemia in chronic kidney disease: Secondary | ICD-10-CM | POA: Diagnosis not present

## 2018-03-27 DIAGNOSIS — N2581 Secondary hyperparathyroidism of renal origin: Secondary | ICD-10-CM | POA: Diagnosis not present

## 2018-03-27 DIAGNOSIS — E1029 Type 1 diabetes mellitus with other diabetic kidney complication: Secondary | ICD-10-CM | POA: Diagnosis not present

## 2018-03-27 DIAGNOSIS — N186 End stage renal disease: Secondary | ICD-10-CM | POA: Diagnosis not present

## 2018-03-27 DIAGNOSIS — Z23 Encounter for immunization: Secondary | ICD-10-CM | POA: Diagnosis not present

## 2018-03-30 DIAGNOSIS — N186 End stage renal disease: Secondary | ICD-10-CM | POA: Diagnosis not present

## 2018-03-30 DIAGNOSIS — D631 Anemia in chronic kidney disease: Secondary | ICD-10-CM | POA: Diagnosis not present

## 2018-03-30 DIAGNOSIS — E1029 Type 1 diabetes mellitus with other diabetic kidney complication: Secondary | ICD-10-CM | POA: Diagnosis not present

## 2018-03-30 DIAGNOSIS — N2581 Secondary hyperparathyroidism of renal origin: Secondary | ICD-10-CM | POA: Diagnosis not present

## 2018-03-30 DIAGNOSIS — Z23 Encounter for immunization: Secondary | ICD-10-CM | POA: Diagnosis not present

## 2018-04-01 DIAGNOSIS — Z23 Encounter for immunization: Secondary | ICD-10-CM | POA: Diagnosis not present

## 2018-04-01 DIAGNOSIS — N2581 Secondary hyperparathyroidism of renal origin: Secondary | ICD-10-CM | POA: Diagnosis not present

## 2018-04-01 DIAGNOSIS — N186 End stage renal disease: Secondary | ICD-10-CM | POA: Diagnosis not present

## 2018-04-01 DIAGNOSIS — E1029 Type 1 diabetes mellitus with other diabetic kidney complication: Secondary | ICD-10-CM | POA: Diagnosis not present

## 2018-04-01 DIAGNOSIS — D631 Anemia in chronic kidney disease: Secondary | ICD-10-CM | POA: Diagnosis not present

## 2018-04-02 DIAGNOSIS — E538 Deficiency of other specified B group vitamins: Secondary | ICD-10-CM | POA: Diagnosis not present

## 2018-04-02 DIAGNOSIS — E785 Hyperlipidemia, unspecified: Secondary | ICD-10-CM | POA: Diagnosis not present

## 2018-04-02 DIAGNOSIS — I119 Hypertensive heart disease without heart failure: Secondary | ICD-10-CM | POA: Diagnosis not present

## 2018-04-02 DIAGNOSIS — I10 Essential (primary) hypertension: Secondary | ICD-10-CM | POA: Diagnosis not present

## 2018-04-02 DIAGNOSIS — N186 End stage renal disease: Secondary | ICD-10-CM | POA: Diagnosis not present

## 2018-04-02 DIAGNOSIS — E119 Type 2 diabetes mellitus without complications: Secondary | ICD-10-CM | POA: Diagnosis not present

## 2018-04-02 DIAGNOSIS — D638 Anemia in other chronic diseases classified elsewhere: Secondary | ICD-10-CM | POA: Diagnosis not present

## 2018-04-02 DIAGNOSIS — Z992 Dependence on renal dialysis: Secondary | ICD-10-CM | POA: Diagnosis not present

## 2018-04-03 DIAGNOSIS — N2581 Secondary hyperparathyroidism of renal origin: Secondary | ICD-10-CM | POA: Diagnosis not present

## 2018-04-03 DIAGNOSIS — D631 Anemia in chronic kidney disease: Secondary | ICD-10-CM | POA: Diagnosis not present

## 2018-04-03 DIAGNOSIS — Z23 Encounter for immunization: Secondary | ICD-10-CM | POA: Diagnosis not present

## 2018-04-03 DIAGNOSIS — N186 End stage renal disease: Secondary | ICD-10-CM | POA: Diagnosis not present

## 2018-04-03 DIAGNOSIS — E1029 Type 1 diabetes mellitus with other diabetic kidney complication: Secondary | ICD-10-CM | POA: Diagnosis not present

## 2018-04-06 DIAGNOSIS — D631 Anemia in chronic kidney disease: Secondary | ICD-10-CM | POA: Diagnosis not present

## 2018-04-06 DIAGNOSIS — E1029 Type 1 diabetes mellitus with other diabetic kidney complication: Secondary | ICD-10-CM | POA: Diagnosis not present

## 2018-04-06 DIAGNOSIS — N2581 Secondary hyperparathyroidism of renal origin: Secondary | ICD-10-CM | POA: Diagnosis not present

## 2018-04-06 DIAGNOSIS — Z23 Encounter for immunization: Secondary | ICD-10-CM | POA: Diagnosis not present

## 2018-04-06 DIAGNOSIS — N186 End stage renal disease: Secondary | ICD-10-CM | POA: Diagnosis not present

## 2018-04-10 DIAGNOSIS — D631 Anemia in chronic kidney disease: Secondary | ICD-10-CM | POA: Diagnosis not present

## 2018-04-10 DIAGNOSIS — E1029 Type 1 diabetes mellitus with other diabetic kidney complication: Secondary | ICD-10-CM | POA: Diagnosis not present

## 2018-04-10 DIAGNOSIS — Z23 Encounter for immunization: Secondary | ICD-10-CM | POA: Diagnosis not present

## 2018-04-10 DIAGNOSIS — N2581 Secondary hyperparathyroidism of renal origin: Secondary | ICD-10-CM | POA: Diagnosis not present

## 2018-04-10 DIAGNOSIS — N186 End stage renal disease: Secondary | ICD-10-CM | POA: Diagnosis not present

## 2018-04-13 DIAGNOSIS — E1029 Type 1 diabetes mellitus with other diabetic kidney complication: Secondary | ICD-10-CM | POA: Diagnosis not present

## 2018-04-13 DIAGNOSIS — Z23 Encounter for immunization: Secondary | ICD-10-CM | POA: Diagnosis not present

## 2018-04-13 DIAGNOSIS — N2581 Secondary hyperparathyroidism of renal origin: Secondary | ICD-10-CM | POA: Diagnosis not present

## 2018-04-13 DIAGNOSIS — N186 End stage renal disease: Secondary | ICD-10-CM | POA: Diagnosis not present

## 2018-04-13 DIAGNOSIS — D631 Anemia in chronic kidney disease: Secondary | ICD-10-CM | POA: Diagnosis not present

## 2018-04-15 ENCOUNTER — Emergency Department (HOSPITAL_COMMUNITY): Payer: Medicare Other

## 2018-04-15 ENCOUNTER — Other Ambulatory Visit: Payer: Self-pay

## 2018-04-15 ENCOUNTER — Observation Stay (HOSPITAL_COMMUNITY)
Admission: EM | Admit: 2018-04-15 | Discharge: 2018-04-16 | Disposition: A | Discharge status: Home / Self Care | Payer: Medicare Other | Attending: Internal Medicine | Admitting: Internal Medicine

## 2018-04-15 ENCOUNTER — Encounter (HOSPITAL_COMMUNITY): Payer: Self-pay | Admitting: Emergency Medicine

## 2018-04-15 DIAGNOSIS — M1990 Unspecified osteoarthritis, unspecified site: Secondary | ICD-10-CM | POA: Diagnosis not present

## 2018-04-15 DIAGNOSIS — E1051 Type 1 diabetes mellitus with diabetic peripheral angiopathy without gangrene: Secondary | ICD-10-CM | POA: Diagnosis not present

## 2018-04-15 DIAGNOSIS — R110 Nausea: Secondary | ICD-10-CM | POA: Diagnosis not present

## 2018-04-15 DIAGNOSIS — E1022 Type 1 diabetes mellitus with diabetic chronic kidney disease: Secondary | ICD-10-CM | POA: Insufficient documentation

## 2018-04-15 DIAGNOSIS — E8889 Other specified metabolic disorders: Secondary | ICD-10-CM | POA: Insufficient documentation

## 2018-04-15 DIAGNOSIS — E785 Hyperlipidemia, unspecified: Secondary | ICD-10-CM | POA: Diagnosis not present

## 2018-04-15 DIAGNOSIS — Z794 Long term (current) use of insulin: Secondary | ICD-10-CM | POA: Insufficient documentation

## 2018-04-15 DIAGNOSIS — Z7982 Long term (current) use of aspirin: Secondary | ICD-10-CM | POA: Insufficient documentation

## 2018-04-15 DIAGNOSIS — R1111 Vomiting without nausea: Secondary | ICD-10-CM | POA: Diagnosis not present

## 2018-04-15 DIAGNOSIS — E875 Hyperkalemia: Secondary | ICD-10-CM | POA: Diagnosis not present

## 2018-04-15 DIAGNOSIS — E109 Type 1 diabetes mellitus without complications: Secondary | ICD-10-CM | POA: Diagnosis present

## 2018-04-15 DIAGNOSIS — Z9641 Presence of insulin pump (external) (internal): Secondary | ICD-10-CM | POA: Insufficient documentation

## 2018-04-15 DIAGNOSIS — J811 Chronic pulmonary edema: Secondary | ICD-10-CM | POA: Diagnosis not present

## 2018-04-15 DIAGNOSIS — I120 Hypertensive chronic kidney disease with stage 5 chronic kidney disease or end stage renal disease: Secondary | ICD-10-CM | POA: Diagnosis not present

## 2018-04-15 DIAGNOSIS — R112 Nausea with vomiting, unspecified: Secondary | ICD-10-CM

## 2018-04-15 DIAGNOSIS — N2581 Secondary hyperparathyroidism of renal origin: Secondary | ICD-10-CM | POA: Diagnosis not present

## 2018-04-15 DIAGNOSIS — Z992 Dependence on renal dialysis: Secondary | ICD-10-CM

## 2018-04-15 DIAGNOSIS — I69392 Facial weakness following cerebral infarction: Secondary | ICD-10-CM | POA: Insufficient documentation

## 2018-04-15 DIAGNOSIS — Z79899 Other long term (current) drug therapy: Secondary | ICD-10-CM | POA: Insufficient documentation

## 2018-04-15 DIAGNOSIS — D631 Anemia in chronic kidney disease: Secondary | ICD-10-CM | POA: Diagnosis not present

## 2018-04-15 DIAGNOSIS — R42 Dizziness and giddiness: Secondary | ICD-10-CM | POA: Diagnosis not present

## 2018-04-15 DIAGNOSIS — Z8639 Personal history of other endocrine, nutritional and metabolic disease: Secondary | ICD-10-CM

## 2018-04-15 DIAGNOSIS — I10 Essential (primary) hypertension: Secondary | ICD-10-CM | POA: Diagnosis present

## 2018-04-15 DIAGNOSIS — N186 End stage renal disease: Secondary | ICD-10-CM | POA: Diagnosis not present

## 2018-04-15 DIAGNOSIS — E10319 Type 1 diabetes mellitus with unspecified diabetic retinopathy without macular edema: Secondary | ICD-10-CM | POA: Diagnosis not present

## 2018-04-15 LAB — COMPREHENSIVE METABOLIC PANEL
ALK PHOS: 57 U/L (ref 38–126)
ALT: 30 U/L (ref 0–44)
ANION GAP: 16 — AB (ref 5–15)
AST: 22 U/L (ref 15–41)
Albumin: 4.2 g/dL (ref 3.5–5.0)
BILIRUBIN TOTAL: 0.8 mg/dL (ref 0.3–1.2)
BUN: 45 mg/dL — ABNORMAL HIGH (ref 6–20)
CALCIUM: 8.2 mg/dL — AB (ref 8.9–10.3)
CO2: 24 mmol/L (ref 22–32)
Chloride: 98 mmol/L (ref 98–111)
Creatinine, Ser: 11.17 mg/dL — ABNORMAL HIGH (ref 0.44–1.00)
GFR calc non Af Amer: 4 mL/min — ABNORMAL LOW (ref >60)
GFR, EST AFRICAN AMERICAN: 4 mL/min — AB (ref >60)
GLUCOSE: 214 mg/dL — AB (ref 70–99)
Potassium: 7.4 mmol/L (ref 3.5–5.1)
Sodium: 138 mmol/L (ref 135–145)
Total Protein: 7.2 g/dL (ref 6.5–8.1)

## 2018-04-15 LAB — BASIC METABOLIC PANEL
Anion gap: 12 (ref 5–15)
BUN: 16 mg/dL (ref 6–20)
CALCIUM: 9 mg/dL (ref 8.9–10.3)
CO2: 30 mmol/L (ref 22–32)
CREATININE: 5.57 mg/dL — AB (ref 0.44–1.00)
Chloride: 93 mmol/L — ABNORMAL LOW (ref 98–111)
GFR calc non Af Amer: 9 mL/min — ABNORMAL LOW (ref >60)
GFR, EST AFRICAN AMERICAN: 10 mL/min — AB (ref >60)
Glucose, Bld: 201 mg/dL — ABNORMAL HIGH (ref 70–99)
Potassium: 3.8 mmol/L (ref 3.5–5.1)
Sodium: 135 mmol/L (ref 135–145)

## 2018-04-15 LAB — CBC WITH DIFFERENTIAL/PLATELET
Abs Immature Granulocytes: 0 10*3/uL (ref 0.0–0.1)
Basophils Absolute: 0 10*3/uL (ref 0.0–0.1)
Basophils Relative: 1 %
EOS PCT: 3 %
Eosinophils Absolute: 0.1 10*3/uL (ref 0.0–0.7)
HEMATOCRIT: 34.3 % — AB (ref 36.0–46.0)
HEMOGLOBIN: 10.6 g/dL — AB (ref 12.0–15.0)
Immature Granulocytes: 1 %
LYMPHS ABS: 0.7 10*3/uL (ref 0.7–4.0)
LYMPHS PCT: 15 %
MCH: 26.4 pg (ref 26.0–34.0)
MCHC: 30.9 g/dL (ref 30.0–36.0)
MCV: 85.3 fL (ref 78.0–100.0)
MONO ABS: 0.1 10*3/uL (ref 0.1–1.0)
Monocytes Relative: 3 %
Neutro Abs: 3.4 10*3/uL (ref 1.7–7.7)
Neutrophils Relative %: 77 %
Platelets: 152 10*3/uL (ref 150–400)
RBC: 4.02 MIL/uL (ref 3.87–5.11)
RDW: 20.1 % — ABNORMAL HIGH (ref 11.5–15.5)
WBC: 4.4 10*3/uL (ref 4.0–10.5)

## 2018-04-15 LAB — GLUCOSE, CAPILLARY: GLUCOSE-CAPILLARY: 220 mg/dL — AB (ref 70–99)

## 2018-04-15 LAB — LIPASE, BLOOD: Lipase: 34 U/L (ref 11–51)

## 2018-04-15 LAB — I-STAT BETA HCG BLOOD, ED (MC, WL, AP ONLY)

## 2018-04-15 LAB — CBG MONITORING, ED
Glucose-Capillary: 196 mg/dL — ABNORMAL HIGH (ref 70–99)
Glucose-Capillary: 395 mg/dL — ABNORMAL HIGH (ref 70–99)

## 2018-04-15 MED ORDER — CINACALCET HCL 30 MG PO TABS: 30.0000 mg | ORAL_TABLET | ORAL | Status: DC

## 2018-04-15 MED ORDER — LIDOCAINE HCL (PF) 1 % IJ SOLN: 5.0000 mL | INTRAMUSCULAR | Status: DC | PRN

## 2018-04-15 MED ORDER — CHLORHEXIDINE GLUCONATE CLOTH 2 % EX PADS: 6.0000 | MEDICATED_PAD | Freq: Every day | CUTANEOUS | Status: DC

## 2018-04-15 MED ORDER — CINACALCET HCL 30 MG PO TABS
60.0000 mg | ORAL_TABLET | ORAL | Status: DC
  Filled 2018-04-15: qty 2

## 2018-04-15 MED ORDER — LIDOCAINE-PRILOCAINE 2.5-2.5 % EX CREA
1.0000 "application " | TOPICAL_CREAM | CUTANEOUS | Status: DC | PRN
  Filled 2018-04-15: qty 5

## 2018-04-15 MED ORDER — CALCIUM GLUCONATE 10 % IV SOLN
1.0000 g | Freq: Once | INTRAVENOUS | Status: AC
  Administered 2018-04-15: 1 g via INTRAVENOUS
  Filled 2018-04-15: qty 10

## 2018-04-15 MED ORDER — AMLODIPINE BESYLATE 10 MG PO TABS
10.0000 mg | ORAL_TABLET | Freq: Every day | ORAL | Status: DC
  Administered 2018-04-15: 10 mg via ORAL
  Filled 2018-04-15: qty 1

## 2018-04-15 MED ORDER — LABETALOL HCL 300 MG PO TABS
300.0000 mg | ORAL_TABLET | Freq: Three times a day (TID) | ORAL | Status: DC
  Administered 2018-04-15 – 2018-04-16 (×2): 300 mg via ORAL
  Filled 2018-04-15 (×2): qty 1

## 2018-04-15 MED ORDER — ROSUVASTATIN CALCIUM 10 MG PO TABS
10.0000 mg | ORAL_TABLET | Freq: Every day | ORAL | Status: DC
  Administered 2018-04-15: 10 mg via ORAL
  Filled 2018-04-15: qty 1

## 2018-04-15 MED ORDER — SODIUM CHLORIDE 0.9 % IV SOLN: 100.0000 mL | INTRAVENOUS | Status: DC | PRN

## 2018-04-15 MED ORDER — DOXERCALCIFEROL 4 MCG/2ML IV SOLN
INTRAVENOUS | Status: AC
  Administered 2018-04-15: 5 ug
  Filled 2018-04-15: qty 4

## 2018-04-15 MED ORDER — HEPARIN SODIUM (PORCINE) 5000 UNIT/ML IJ SOLN
5000.0000 [IU] | Freq: Three times a day (TID) | INTRAMUSCULAR | Status: DC
  Filled 2018-04-15 (×2): qty 1

## 2018-04-15 MED ORDER — ALBUTEROL SULFATE (2.5 MG/3ML) 0.083% IN NEBU
10.0000 mg | INHALATION_SOLUTION | Freq: Once | RESPIRATORY_TRACT | Status: AC
  Administered 2018-04-15: 10 mg via RESPIRATORY_TRACT
  Filled 2018-04-15: qty 12

## 2018-04-15 MED ORDER — SODIUM BICARBONATE 8.4 % IV SOLN
50.0000 meq | Freq: Once | INTRAVENOUS | Status: AC
  Administered 2018-04-15: 50 meq via INTRAVENOUS
  Filled 2018-04-15: qty 50

## 2018-04-15 MED ORDER — PENTAFLUOROPROP-TETRAFLUOROETH EX AERO
1.0000 "application " | INHALATION_SPRAY | CUTANEOUS | Status: DC | PRN
  Filled 2018-04-15: qty 30

## 2018-04-15 MED ORDER — ACETAMINOPHEN 650 MG RE SUPP: 650.0000 mg | Freq: Four times a day (QID) | RECTAL | Status: DC | PRN

## 2018-04-15 MED ORDER — RENA-VITE PO TABS
1.0000 | ORAL_TABLET | Freq: Every day | ORAL | Status: DC
  Filled 2018-04-15: qty 1

## 2018-04-15 MED ORDER — FLINSTONES GUMMIES OMEGA-3 DHA PO CHEW: 2.0000 | CHEWABLE_TABLET | Freq: Every day | ORAL | Status: DC

## 2018-04-15 MED ORDER — INSULIN ASPART 100 UNIT/ML IV SOLN: 5.0000 [IU] | Freq: Once | INTRAVENOUS | Status: DC

## 2018-04-15 MED ORDER — LORATADINE 10 MG PO TABS: 10.0000 mg | ORAL_TABLET | Freq: Every day | ORAL | Status: DC | PRN

## 2018-04-15 MED ORDER — DEXTROSE 50 % IV SOLN
1.0000 | Freq: Once | INTRAVENOUS | Status: AC
  Administered 2018-04-15: 50 mL via INTRAVENOUS
  Filled 2018-04-15: qty 50

## 2018-04-15 MED ORDER — DEXTROSE 50 % IV SOLN: 1.0000 | Freq: Once | INTRAVENOUS | Status: DC

## 2018-04-15 MED ORDER — BISACODYL 5 MG PO TBEC: 5.0000 mg | DELAYED_RELEASE_TABLET | Freq: Every day | ORAL | Status: DC | PRN

## 2018-04-15 MED ORDER — DOXERCALCIFEROL 4 MCG/2ML IV SOLN
5.0000 ug | INTRAVENOUS | Status: DC
  Administered 2018-04-15: 4 ug via INTRAVENOUS

## 2018-04-15 MED ORDER — ASPIRIN EC 81 MG PO TBEC
81.0000 mg | DELAYED_RELEASE_TABLET | Freq: Every day | ORAL | Status: DC
  Filled 2018-04-15: qty 1

## 2018-04-15 MED ORDER — INSULIN ASPART 100 UNIT/ML ~~LOC~~ SOLN: 0.0000 [IU] | Freq: Three times a day (TID) | SUBCUTANEOUS | Status: DC

## 2018-04-15 MED ORDER — INSULIN ASPART 100 UNIT/ML ~~LOC~~ SOLN
5.0000 [IU] | Freq: Once | SUBCUTANEOUS | Status: AC
  Administered 2018-04-15: 5 [IU] via INTRAVENOUS
  Filled 2018-04-15: qty 1

## 2018-04-15 MED ORDER — OMEGA-3-ACID ETHYL ESTERS 1 G PO CAPS
1.0000 g | ORAL_CAPSULE | Freq: Two times a day (BID) | ORAL | Status: DC
  Filled 2018-04-15: qty 1

## 2018-04-15 MED ORDER — ALTEPLASE 2 MG IJ SOLR
2.0000 mg | Freq: Once | INTRAMUSCULAR | Status: DC | PRN
  Filled 2018-04-15: qty 2

## 2018-04-15 MED ORDER — HEPARIN SODIUM (PORCINE) 1000 UNIT/ML DIALYSIS
1000.0000 [IU] | INTRAMUSCULAR | Status: DC | PRN
  Filled 2018-04-15: qty 1

## 2018-04-15 MED ORDER — ACETAMINOPHEN 325 MG PO TABS: 650.0000 mg | ORAL_TABLET | Freq: Four times a day (QID) | ORAL | Status: DC | PRN

## 2018-04-15 MED ORDER — SENNOSIDES-DOCUSATE SODIUM 8.6-50 MG PO TABS: 1.0000 | ORAL_TABLET | Freq: Every evening | ORAL | Status: DC | PRN

## 2018-04-15 MED ORDER — CALCIUM ACETATE (PHOS BINDER) 667 MG PO CAPS
667.0000 mg | ORAL_CAPSULE | Freq: Three times a day (TID) | ORAL | Status: DC
  Administered 2018-04-15 – 2018-04-16 (×2): 667 mg via ORAL
  Filled 2018-04-15 (×2): qty 1

## 2018-04-15 MED ORDER — ONDANSETRON HCL 4 MG PO TABS: 4.0000 mg | ORAL_TABLET | Freq: Four times a day (QID) | ORAL | Status: DC | PRN

## 2018-04-15 MED ORDER — HYDROCODONE-ACETAMINOPHEN 5-325 MG PO TABS: 1.0000 | ORAL_TABLET | ORAL | Status: DC | PRN

## 2018-04-15 MED ORDER — HEPARIN SODIUM (PORCINE) 1000 UNIT/ML DIALYSIS
20.0000 [IU]/kg | INTRAMUSCULAR | Status: DC | PRN
  Filled 2018-04-15: qty 2

## 2018-04-15 MED ORDER — ONDANSETRON HCL 4 MG/2ML IJ SOLN: 4.0000 mg | Freq: Four times a day (QID) | INTRAMUSCULAR | Status: DC | PRN

## 2018-04-15 MED ORDER — INSULIN PUMP
Freq: Three times a day (TID) | SUBCUTANEOUS | Status: DC
  Administered 2018-04-15 – 2018-04-16 (×2): via SUBCUTANEOUS
  Administered 2018-04-16: 1.28 via SUBCUTANEOUS
  Filled 2018-04-15: qty 1

## 2018-04-15 NOTE — Consult Note (Signed)
[facility] Renal Consultation Note    Indication for Consultation:  Management of ESRD/hemodialysis; anemia, hypertension/volume and secondary hyperparathyroidism  HPI: [name] is a 42 y.o. female with ESRD on HD TTS at [facility]. PMH: DMT1, HTN, HLD, PVD.   She was on her way to dialysis today when became SOB with N/V. Couldn't drive so called EMS. In ED found to have K 7.4. Calcium gluconate/Na bicarb/insulin given in ED and we are asked to see for urgent dialysis. O2 sats 96% on RA. CXR ordered. Labs: Na 138, Glucose 218, BUN 45 Cr 11.17, WBC 4.4 Hgb 10.6   Seen in ED. She is breathing comfortably on RA. Vomiting improved with Zofran, cool compress. Denies fever, chills, CP, abdominal pain, diarrhea.   Last HD was Tuesday 9/24. She did complete her full treatment. Generally compliant with dialysis though did miss a treatment last Thursday. Has been using R arm AVGG that was placed 01/11/18.   Past Medical History:  Diagnosis Date  . Anemia   . Arthritis   . Diabetes mellitus   . Diabetic retinopathy ([place])    Hx of laser Rx's  . DM type 1 (diabetes mellitus, type 1) ([place]) 09/12/2014   Age of onset for DM type 1 was age 79.     . Enlarged thyroid gland   . ESRD on hemodialysis ([place])    ESRD due to DM type I, age of onset DM 1 was age 34.  Went on dialysis in March 2012.  Gets HD now at [facility] on a TTS schedule.    Marland Kitchen ESRD on hemodialysis ([place]) 09/12/2014   ESRD due to DM type 1.  Started HD in 2012 at [facility].  Gets HD there on a TTS schedule.    Marland Kitchen GERD (gastroesophageal reflux disease)   . History of blood transfusion   . Hyperlipidemia   . Hypertension   . Peripheral vascular disease ([place])   . Pneumonia   . Stroke [facility])    facial drooping   Past Surgical History:  Procedure Laterality Date  . ARTERIOVENOUS GRAFT PLACEMENT    . ARTERY REPAIR Left 12/10/2012   Procedure: BRACHIAL ARTERY REPAIR;  Surgeon: Angelia Mould, MD;  Location: [facility] OR;  Service: Vascular;  Laterality: Left;  Exploration Left Brachial Artery for AVF  . AV FISTULA PLACEMENT Right 01/11/2018   Procedure: INSERTION OF ARTERIOVENOUS (AV) GORE-TEX GRAFT  UPPER ARM;  Surgeon: Angelia Mould, MD;  Location: [place];  Service: Vascular;  Laterality: Right;  . BASCILIC VEIN TRANSPOSITION Right 09/28/2017   Procedure: FIRST STAGE BASILIC VEIN TRANSPOSITION;  Surgeon: Angelia Mould, MD;  Location: [place];  Service: Vascular;  Laterality: Right;  . CESAREAN SECTION    . EYE SURGERY     LAzer  . REVISION OF ARTERIOVENOUS GORETEX GRAFT Left 08/01/2016   Procedure: REVISION OF ARTERIOVENOUS GORETEX GRAFT;  Surgeon: Angelia Mould, MD;  Location: [place];  Service: Vascular;  Laterality: Left;  . SHUNTOGRAM Left 11/30/2013   Procedure: SHUNTOGRAM;  Surgeon: Serafina Mitchell, MD;  Location[facility] C[facility];  Service: Cardiovascular;  Laterality: Left;   Family History  Problem Relation Age of Onset  . Cancer Mother        breast  . Hypertension Father    Social History:  reports that she has never smoked. She has never used smokeless tobacco. She reports that she does not drink alcohol or use drugs. Allergies  Allergen Reactions  .  Pollen Extract Other (See Comments)    Watery eyes   Prior to Admission medications   Medication Sig Start Date End Date Taking? Authorizing Provider  amLODipine (NORVASC) 10 MG tablet Take 10 mg by mouth at bedtime.    Yes [provider]  aspirin 81 MG EC tablet Take 81 mg by mouth at bedtime. Swallow whole.    Yes [provider]  calcium acetate (PHOSLO) 667 MG capsule Take 667 mg by mouth 3 (three) times daily with meals. May take an additional 667 mg dose with snacks   Yes [provider]  cinacalcet (SENSIPAR) 30 MG tablet Take 30 mg by mouth See admin instructions. Taking only on Dialysis days Tue, Thur, Sat.   Yes [provider]  ethyl chloride spray  Apply 1 application topically as needed (prior to port access).  07/27/17  Yes [provider]  GLUCAGON EMERGENCY 1 MG injection Inject 1 mg into the muscle once as needed (for severe low blood sugar).  11/22/12  Yes [provider]  insulin aspart (NOVOLOG) 100 UNIT/ML injection Inject 50 Units into the skin See admin instructions. Use with insulin pump daily 02/17/18  Yes [provider]  Insulin Human (INSULIN PUMP) SOLN Inject into the skin continuous. Novolog   Basal rate 0.29 units/ hour   Yes [provider]  labetalol (NORMODYNE) 300 MG tablet Take 300 mg by mouth 3 (three) times daily with meals.    Yes [provider]  loratadine (CLARITIN) 10 MG tablet Take 10 mg by mouth daily as needed for allergies.   Yes [provider]  Pediatric Multiple Vit-C-FA (FLINSTONES GUMMIES OMEGA-3 DHA) CHEW Chew 2 tablets by mouth at bedtime.    Yes [provider]  oxyCODONE (ROXICODONE) 5 MG immediate release tablet Take 1 tablet (5 mg total) by mouth every 4 (four) hours as needed for severe pain. Patient not taking: Reported on 02/17/2018 01/11/18   Angelia Mould, MD  rosuvastatin (CRESTOR) 10 MG tablet Take 10 mg by mouth at bedtime.     [provider]   Current Facility-Administered Medications  Medication Dose Route Frequency Provider Last Rate Last Dose  . [START ON 04/16/2018] Chlorhexidine Gluconate Cloth 2 % PADS 6 each  6 each Topical Q0600 Lynnda Child, PA-C       Current Outpatient Medications  Medication Sig Dispense Refill  . amLODipine (NORVASC) 10 MG tablet Take 10 mg by mouth at bedtime.     Marland Kitchen aspirin 81 MG EC tablet Take 81 mg by mouth at bedtime. Swallow whole.     . calcium acetate (PHOSLO) 667 MG capsule Take 667 mg by mouth 3 (three) times daily with meals. May take an additional 667 mg dose with snacks    . cinacalcet (SENSIPAR) 30 MG tablet Take 30 mg by mouth See admin instructions. Taking only  on Dialysis days Tue, Thur, Sat.    . ethyl chloride spray Apply 1 application topically as needed (prior to port access).   3  . GLUCAGON EMERGENCY 1 MG injection Inject 1 mg into the muscle once as needed (for severe low blood sugar).     . insulin aspart (NOVOLOG) 100 UNIT/ML injection Inject 50 Units into the skin See admin instructions. Use with insulin pump daily    . Insulin Human (INSULIN PUMP) SOLN Inject into the skin continuous. Novolog   Basal rate 0.29 units/ hour    . labetalol (NORMODYNE) 300 MG tablet Take 300 mg by  mouth 3 (three) times daily with meals.     Marland Kitchen loratadine (CLARITIN) 10 MG tablet Take 10 mg by mouth daily as needed for allergies.    . Pediatric Multiple Vit-C-FA (FLINSTONES GUMMIES OMEGA-3 DHA) CHEW Chew 2 tablets by mouth at bedtime.     Marland Kitchen oxyCODONE (ROXICODONE) 5 MG immediate release tablet Take 1 tablet (5 mg total) by mouth every 4 (four) hours as needed for severe pain. (Patient not taking: Reported on 02/17/2018) 15 tablet 0  . rosuvastatin (CRESTOR) 10 MG tablet Take 10 mg by mouth at bedtime.       ROS: As per HPI otherwise negative.  Physical Exam: Vitals:   04/15/18 1015 04/15/18 1030 04/15/18 1045 04/15/18 1100  BP: 117/76 122/75 124/77 129/69  Resp: (!) 24 (!) 23 18   Temp:      TempSrc:      SpO2:         General: WDWN female NAD  Head: NCAT sclera not icteric MMM Neck: Supple. No JVD No masses Lungs: CTA bilaterally without wheezes, rales, or rhonchi. Breathing is unlabored. Heart: RRR with S1 S2 Abdomen: soft NT + BS Lower extremities:without edema or ischemic changes, no open wounds  Neuro: A & O  X 3. Moves all extremities spontaneously. Psych:  Responds to questions appropriately with a normal affect. Dialysis Access: RUE AVG +bruit; Also has old LUE AVG   Labs: Basic Metabolic Panel: Recent Labs  Lab 04/15/18 0944  NA 138  K 7.4*  CL 98  CO2 24  GLUCOSE 214*  BUN 45*  CREATININE 11.17*  CALCIUM 8.2*   Liver Function  Tests: Recent Labs  Lab 04/15/18 0944  AST 22  ALT 30  ALKPHOS 57  BILITOT 0.8  PROT 7.2  ALBUMIN 4.2   Recent Labs  Lab 04/15/18 0944  LIPASE 34   No results for input(s): AMMONIA in the last 168 hours. CBC: Recent Labs  Lab 04/15/18 0944  WBC 4.4  NEUTROABS 3.4  HGB 10.6*  HCT 34.3*  MCV 85.3  PLT 152   Cardiac Enzymes: No results for input(s): CKTOTAL, CKMB, CKMBINDEX, TROPONINI in the last 168 hours. CBG: Recent Labs  Lab 04/15/18 1012 04/15/18 1249  GLUCAP 196* 395*   Iron Studies: No results for input(s): IRON, TIBC, TRANSFERRIN, FERRITIN in the last 72 hours. Studies/Results: No results found.  Dialysis Orders:  AF TTS 3.5h 180NRe 425/A1.5x EDW 54kg 2K/2.25Ca Profile 4 Na Linear Using R AVG Heparin bolus 2000 U -Hectorol 79mcg IV TIW -Mircera 75 mcg IV q 2 weeks (last 9/5) -Sensipar 60 mg TIW   Assessment/Plan: 1. Hyperkalemia - K  7.4 on admission. Medical management in ED. For urgent dialysis today to correct. Follow labs.  2. Nausea/vomiting - Improving with Zofran; Per primary  3. ESRD -  HD TTS. For HD today  4. Hypertension/volume  - BP controlled here. Usually high as outpatient. On amlodipine 10; labetalol 300 tid.  CXR pending. No gross volume on exam.  UF to EDW  5. Anemia  - Hgb 10.6. Resume ESA if < 10.  6. Metabolic bone disease -  Ca ok. Continue VDRA/Ca acetate binder/Sensipar  7. Nutrition - Renal diet/vitamins  8. DM - per primary   Lynnda Child PA-C [place] Pager [phone] 04/15/2018, 1:51 PM

## 2018-04-15 NOTE — H&P (Signed)
History and Physical    [name] XBJ:[id number] DOB: [birth year] DOA: 04/15/2018  PCP: Benito Mccreedy, MD Patient coming from: Home   Chief Complaint: Nausea  HPI: [name] is a 42 y.o. female with medical history significant of with history of ESRD on hemodialysis Tuesday, Thursday and Saturday, diabetes mellitus type 1, essential hypertension, hyperlipidemia came to the ER for evaluation of nausea.  Patient states she missed her last hemodialysis last Thursday and then went to dialysis last Saturday and this past Tuesday.  Today while she was on her way to dialysis she started feeling very nauseous and overall not well therefore called EMS and came to the ER.  She denied any chest pain, lightheadedness, dizziness, shortness of breath and other complaints. In the ER patient was noted to have potassium of 7.4, BUN 45 and creatinine of 11.7.  EKG did not show any acute ST-T changes.  She was given calcium gluconate, albuterol, 1 amp of D50 and insulin.  Nephrology was consulted for dialysis.  When I saw the patient at bedside she was asymptomatic and did not have any complaints at all.    Review of Systems: As per HPI otherwise 10 point review of systems negative.  Review of Systems Otherwise negative except as per HPI, including: General: Denies fever, chills, night sweats or unintended weight loss. Resp: Denies cough, wheezing Cardiac: Denies chest pain, palpitations, orthopnea, paroxysmal nocturnal dyspnea. GI: Denies abdominal pain, nausea, vomiting, diarrhea or constipation GU: Denies dysuria, frequency, hesitancy or incontinence MS: Denies muscle aches, joint pain or swelling Neuro: Denies headache, neurologic deficits (focal weakness, numbness, tingling), abnormal gait Psych: Denies anxiety, depression, SI/HI/AVH Skin: Denies new rashes or lesions ID: Denies sick contacts, exotic exposures, travel  Past Medical History:  Diagnosis Date  . Anemia   .  Arthritis   . Diabetes mellitus   . Diabetic retinopathy ([place])    Hx of laser Rx's  . DM type 1 (diabetes mellitus, type 1) ([place]) 09/12/2014   Age of onset for DM type 1 was age 42.     . Enlarged thyroid gland   . ESRD on hemodialysis ([place])    ESRD due to DM type I, age of onset DM 1 was age 27.  Went on dialysis in March 2012.  Gets HD now at [facility] on a TTS schedule.    Marland Kitchen ESRD on hemodialysis ([place]) 09/12/2014   ESRD due to DM type 1.  Started HD in 2012 at [facility].  Gets HD there on a TTS schedule.    Marland Kitchen GERD (gastroesophageal reflux disease)   . History of blood transfusion   . Hyperlipidemia   . Hypertension   . Peripheral vascular disease ([place])   . Pneumonia   . Stroke [facility])    facial drooping    Past Surgical History:  Procedure Laterality Date  . ARTERIOVENOUS GRAFT PLACEMENT    . ARTERY REPAIR Left 12/10/2012   Procedure: BRACHIAL ARTERY REPAIR;  Surgeon: Angelia Mould, MD;  Location: [facility] OR;  Service: Vascular;  Laterality: Left;  Exploration Left Brachial Artery for AVF  . AV FISTULA PLACEMENT Right 01/11/2018   Procedure: INSERTION OF ARTERIOVENOUS (AV) GORE-TEX GRAFT  UPPER ARM;  Surgeon: Angelia Mould, MD;  Location: [place];  Service: Vascular;  Laterality: Right;  . BASCILIC VEIN TRANSPOSITION Right 09/28/2017   Procedure: FIRST STAGE BASILIC VEIN TRANSPOSITION;  Surgeon: Angelia Mould, MD;  Location: [place];  Service: Vascular;  Laterality: Right;  .  CESAREAN SECTION    . EYE SURGERY     LAzer  . REVISION OF ARTERIOVENOUS GORETEX GRAFT Left 08/01/2016   Procedure: REVISION OF ARTERIOVENOUS GORETEX GRAFT;  Surgeon: Angelia Mould, MD;  Location: [place];  Service: Vascular;  Laterality: Left;  . SHUNTOGRAM Left 11/30/2013   Procedure: SHUNTOGRAM;  Surgeon: Serafina Mitchell, MD;  Location: [facility] CATH LAB;  Service: Cardiovascular;  Laterality: Left;    SOCIAL HISTORY:  reports that she has never smoked. She has never used smokeless  tobacco. She reports that she does not drink alcohol or use drugs.  Allergies  Allergen Reactions  . Pollen Extract Other (See Comments)    Watery eyes    FAMILY HISTORY: Family History  Problem Relation Age of Onset  . Cancer Mother        breast  . Hypertension Father      Prior to Admission medications   Medication Sig Start Date End Date Taking? Authorizing Provider  amLODipine (NORVASC) 10 MG tablet Take 10 mg by mouth at bedtime.     [provider]  aspirin 81 MG EC tablet Take 81 mg by mouth at bedtime. Swallow whole.     [provider]  calcium acetate (PHOSLO) 667 MG capsule Take 667 mg by mouth 3 (three) times daily with meals. May take an additional 667 mg dose with snacks    [provider]  cinacalcet (SENSIPAR) 30 MG tablet Take 30 mg by mouth every evening.     [provider]  ethyl chloride spray USE TOPICAL PRIOR TO PORT ACCESS 07/27/17   [provider]  GLUCAGON EMERGENCY 1 MG injection Inject 1 mg into the muscle once as needed (for severe low blood sugar).  11/22/12   [provider]  insulin aspart (NOVOLOG) 100 UNIT/ML injection Use as directed in insulin pump.  MDD: 100 units/day 02/17/18   [provider]  Insulin Human (INSULIN PUMP) SOLN Inject into the skin continuous. Novolog   Basal rate 0.29 units/ hour    [provider]  labetalol (NORMODYNE) 300 MG tablet Take 300 mg by mouth 3 (three) times daily with meals.     [provider]  loratadine (CLARITIN) 10 MG tablet Take 10 mg by mouth daily as needed for allergies.    [provider]  oxyCODONE (ROXICODONE) 5 MG immediate release tablet Take 1 tablet (5 mg total) by mouth every 4 (four) hours as needed for severe pain. Patient not taking: Reported on 02/17/2018 01/11/18   Angelia Mould, MD  Pediatric Multiple Vit-C-FA (FLINSTONES GUMMIES OMEGA-3 DHA) CHEW Chew 2 tablets by mouth at bedtime.     [provider]  rosuvastatin (CRESTOR) 10 MG tablet Take 10 mg by mouth at bedtime.     [provider]    Physical Exam: Vitals:   04/15/18 1015 04/15/18 1030 04/15/18 1045 04/15/18 1100  BP: 117/76 122/75 124/77 129/69  Resp: (!) 24 (!) 23 18   Temp:      TempSrc:      SpO2:          Constitutional: NAD, calm, comfortable Eyes: PERRL, lids and conjunctivae normal ENMT: Mucous membranes are moist. Posterior pharynx clear of any exudate or lesions.Normal dentition.  Neck: normal, supple, no masses, no thyromegaly Respiratory: diminished BS at the bases  Cardiovascular: Regular rate and rhythm, no murmurs / rubs / gallops. No extremity edema. 2+ pedal pulses. No carotid bruits.  Abdomen: no tenderness, no masses  palpated. No hepatosplenomegaly. Bowel sounds positive.  Musculoskeletal: no clubbing / cyanosis. No joint deformity upper and lower extremities. Good ROM, no contractures. Normal muscle tone.  Skin: no rashes, lesions, ulcers. No induration Neurologic: CN 2-12 grossly intact. Sensation intact, DTR normal. Strength 5/5 in all 4.  Psychiatric: Normal judgment and insight. Alert and oriented x 3. Normal mood.  LUE fistula    Labs on Admission: I have personally reviewed following labs and imaging studies  CBC: Recent Labs  Lab 04/15/18 0944  WBC 4.4  NEUTROABS 3.4  HGB 10.6*  HCT 34.3*  MCV 85.3  PLT 466   Basic Metabolic Panel: Recent Labs  Lab 04/15/18 0944  NA 138  K 7.4*  CL 98  CO2 24  GLUCOSE 214*  BUN 45*  CREATININE 11.17*  CALCIUM 8.2*   GFR: CrCl cannot be calculated (Unknown ideal weight.). Liver Function Tests: Recent Labs  Lab 04/15/18 0944  AST 22  ALT 30  ALKPHOS 57  BILITOT 0.8  PROT 7.2  ALBUMIN 4.2   Recent Labs  Lab 04/15/18 0944  LIPASE 34   No results for input(s): AMMONIA in the last 168 hours. Coagulation Profile: No results for input(s): INR, PROTIME in the last 168 hours. Cardiac Enzymes: No results  for input(s): CKTOTAL, CKMB, CKMBINDEX, TROPONINI in the last 168 hours. BNP (last 3 results) No results for input(s): PROBNP in the last 8760 hours. HbA1C: No results for input(s): HGBA1C in the last 72 hours. CBG: Recent Labs  Lab 04/15/18 1012 04/15/18 1249  GLUCAP 196* 395*   Lipid Profile: No results for input(s): CHOL, HDL, LDLCALC, TRIG, CHOLHDL, LDLDIRECT in the last 72 hours. Thyroid Function Tests: No results for input(s): TSH, T4TOTAL, FREET4, T3FREE, THYROIDAB in the last 72 hours. Anemia Panel: No results for input(s): VITAMINB12, FOLATE, FERRITIN, TIBC, IRON, RETICCTPCT in the last 72 hours. Urine analysis:    Component Value Date/Time   COLORURINE YELLOW 03/29/2014 [place] 03/29/2014 1734   LABSPEC 1.015 03/29/2014 1734   PHURINE 8.0 03/29/2014 1734   GLUCOSEU >1000 (A) 03/29/2014 1734   HGBUR SMALL (A) 03/29/2014 1734   BILIRUBINUR NEGATIVE 03/29/2014 1734   KETONESUR NEGATIVE 03/29/2014 1734   PROTEINUR 100 (A) 03/29/2014 1734   UROBILINOGEN 0.2 03/29/2014 1734   NITRITE NEGATIVE 03/29/2014 1734   LEUKOCYTESUR TRACE (A) 03/29/2014 1734   Sepsis Labs: !!!!!!!!!!!!!!!!!!!!!!!!!!!!!!!!!!!!!!!!!!!! @LABRCNTIP (procalcitonin:4,lacticidven:4) )No results found for this or any previous visit (from the past 240 hour(s)).   Radiological Exams on Admission: Dg Chest 2 View  Result Date: 04/15/2018 CLINICAL DATA:  End stage renal disease with nausea and vomiting. EXAM: CHEST - 2 VIEW COMPARISON:  09/11/2014 FINDINGS: Lungs are adequately inflated with hazy bilateral perihilar opacification likely mild interstitial edema. No evidence of effusion. No lobar consolidation. Cardiomediastinal silhouette and remainder of the exam is unchanged. IMPRESSION: Findings likely representing mild interstitial edema. Electronically Signed   By: Marin Olp M.D.   On: 04/15/2018 14:13     All images have been reviewed by me personally.  EKG: Independently  reviewed. No peaked t waves  Assessment/Plan Principal Problem:   Hyperkalemia Active Problems:   HLD (hyperlipidemia)   ESRD on hemodialysis (HCC)   Diabetes mellitus type I ([place])   History of diabetic retinopathy   Benign essential HTN   Nausea   Hyperkalemia without any acute ST-T changes ESRD on hemodialysis T/TH/Saturday Nausea, improved - Admit the patient for observation for dialysis.  Potassium is 7.4 but no evidence of EKG  changes at this time.  Albuterol, calcium gluconate, insulin and amp of D50 has been given -Nephrology has been consulted who plans on dialyzing the patient today. -I suspect nausea secondary to bowel edema and electrolyte imbalance.  Currently she is asymptomatic. -Resume her home renal medications.  Diabetes mellitus type 1, insulin-dependent - We will place the patient on insulin sliding scale.  We will start her on her basal insulin slowly after dialysis.  Essential hypertension -Continue Norvasc 10 mg daily  Hyperlipidemia -Continue Crestor  DVT prophylaxis: Hep SQ Code Status: She is a full code Family Communication: None at bedside Disposition Plan: To be determined Consults called: Nephrology Admission status: Observation telemetry for hyperkalemia   Time Spent: 65 minutes.  >50% of the time was devoted to discussing the patients care, assessment, plan and disposition with other care givers along with counseling the patient about the risks and benefits of treatment.   Please Note: This patient record was dictated using Editor, commissioning. Chart creation errors have been sought, but may not always have been located. Such creation errors do not reflect on the Standard of Medical Care.   Ankit Arsenio Loader MD [facility] Pager [phone]  If 7PM-7AM, please contact night-coverage [email or web address] Password [facility]  04/15/2018, 2:29 PM

## 2018-04-15 NOTE — Procedures (Signed)
Tol HD.  WIll use 1K bath for entire treatment. There is no hemodynamic instability.  She is feeling better with treatment.

## 2018-04-15 NOTE — ED Provider Notes (Signed)
[facility] EMERGENCY DEPARTMENT Provider Note   CSN: [id number] Arrival date & time: 04/15/18  0930     History   Chief Complaint Chief Complaint  Patient presents with  . Nausea  . Emesis    HPI [name] is a 42 y.o. female.  The history is provided by the patient and the EMS personnel. No language interpreter was used.     42 year old female with history of end-stage renal disease currently on Tuesday Thursday Saturday dialysis, type 1 diabetes, hypertension, GERD brought here via EMS for evaluation of nausea and vomiting.  Patient reports she was on her way to dialysis this morning when she felt very nauseous, vomited multiple episodes, having generalized weakness and lightheadedness.  She felt that she could no longer drives and she has to pull her car to the side of the road and called EMS.  Her symptom lasting for approximately 15 minutes and has since improved after patient received Zofran via EMS.  She denies any associated pain with this symptom.  She denies missing any dialysis appointment.  She denies any recent medication changes and denies any alcohol or drug use.  She has never had this symptom before.  No complaint of headache, neck pain, chest pain, trouble breathing, abdominal pain, back pain.  Patient does not make urine.  She has not missed any dialysis appointment until today.  Past Medical History:  Diagnosis Date  . Anemia   . Arthritis   . Diabetes mellitus   . Diabetic retinopathy ([place])    Hx of laser Rx's  . DM type 1 (diabetes mellitus, type 1) ([place]) 09/12/2014   Age of onset for DM type 1 was age 34.     . Enlarged thyroid gland   . ESRD on hemodialysis ([place])    ESRD due to DM type I, age of onset DM 1 was age 83.  Went on dialysis in March 2012.  Gets HD now at [facility] on a TTS schedule.    Marland Kitchen ESRD on hemodialysis ([place]) 09/12/2014   ESRD due to DM type 1.  Started HD in 2012 at [facility].  Gets HD there on a TTS  schedule.    Marland Kitchen GERD (gastroesophageal reflux disease)   . History of blood transfusion   . Hyperlipidemia   . Hypertension   . Peripheral vascular disease ([place])   . Pneumonia   . Stroke [facility])    facial drooping    Patient Active Problem List   Diagnosis Date Noted  . Cerebral infarction due to thrombosis of basilar artery ([place]) 03/11/2017  . Left hand pain 08/25/2016  . Hyperparathyroidism, secondary renal ([place])   . Benign essential HTN   . Pneumonia 09/12/2014  . CAP (community acquired pneumonia) 09/12/2014  . ESRD on hemodialysis ([place]) 09/12/2014  . Diabetes mellitus type I ([place]) 09/12/2014  . History of diabetic retinopathy 09/12/2014  . HCAP (healthcare-associated pneumonia) 09/11/2014  . Cerebral infarction due to unspecified mechanism 05/22/2014  . Hypertensive emergency 05/22/2014  . Essential hypertension, benign 05/22/2014  . DM (diabetes mellitus) ([place]) 05/22/2014  . HLD (hyperlipidemia) 05/22/2014  . Cerebral infarction ([place]) 03/29/2014  . Facial droop 03/29/2014  . Mechanical complication of other vascular device, implant, and graft 01/12/2013  . Other complications due to renal dialysis device, implant, and graft 12/08/2012  . ESRD on hemodialysis ([place]) 04/18/2011    Past Surgical History:  Procedure Laterality Date  . ARTERIOVENOUS GRAFT PLACEMENT    . ARTERY REPAIR  Left 12/10/2012   Procedure: BRACHIAL ARTERY REPAIR;  Surgeon: Angelia Mould, MD;  Location: [facility] OR;  Service: Vascular;  Laterality: Left;  Exploration Left Brachial Artery for AVF  . AV FISTULA PLACEMENT Right 01/11/2018   Procedure: INSERTION OF ARTERIOVENOUS (AV) GORE-TEX GRAFT  UPPER ARM;  Surgeon: Angelia Mould, MD;  Location: [facility];  Service: Vascular;  Laterality: Right;  . BASCILIC VEIN TRANSPOSITION Right 09/28/2017   Procedure: FIRST STAGE BASILIC VEIN TRANSPOSITION;  Surgeon: Angelia Mould, MD;  Location: [facility];  Service: Vascular;  Laterality: Right;  . CESAREAN  SECTION    . EYE SURGERY     [place]  . REVISION OF ARTERIOVENOUS GORETEX GRAFT Left 08/01/2016   Procedure: REVISION OF ARTERIOVENOUS GORETEX GRAFT;  Surgeon: Angelia Mould, MD;  Location: [facility];  Service: Vascular;  Laterality: Left;  . SHUNTOGRAM Left 11/30/2013   Procedure: SHUNTOGRAM;  Surgeon: Serafina Mitchell, MD;  Location: [facility] CATH LAB;  Service: Cardiovascular;  Laterality: Left;     OB History    Gravida  0   Para  0   Term  0   Preterm  0   AB  0   Living        SAB  0   TAB  0   Ectopic  0   Multiple      Live Births               Home Medications    Prior to Admission medications   Medication Sig Start Date End Date Taking? Authorizing Provider  amLODipine (NORVASC) 10 MG tablet Take 10 mg by mouth at bedtime.     [provider]  aspirin 81 MG EC tablet Take 81 mg by mouth at bedtime. Swallow whole.     [provider]  calcium acetate (PHOSLO) 667 MG capsule Take 667 mg by mouth 3 (three) times daily with meals. May take an additional 667 mg dose with snacks    [provider]  cinacalcet (SENSIPAR) 30 MG tablet Take 30 mg by mouth every evening.     [provider]  ethyl chloride spray USE TOPICAL PRIOR TO PORT ACCESS 07/27/17   [provider]  GLUCAGON EMERGENCY 1 MG injection Inject 1 mg into the muscle once as needed (for severe low blood sugar).  11/22/12   [provider]  insulin aspart (NOVOLOG) 100 UNIT/ML injection Use as directed in insulin pump.  MDD: 100 units/day 02/17/18   [provider]  Insulin Human (INSULIN PUMP) SOLN Inject into the skin continuous. Novolog   Basal rate 0.29 units/ hour    [provider]  labetalol (NORMODYNE) 300 MG tablet Take 300 mg by mouth 3 (three) times daily with meals.     [provider]  loratadine (CLARITIN) 10 MG tablet Take 10 mg by mouth daily as needed for allergies.    [provider]  oxyCODONE  (ROXICODONE) 5 MG immediate release tablet Take 1 tablet (5 mg total) by mouth every 4 (four) hours as needed for severe pain. Patient not taking: Reported on 02/17/2018 01/11/18   Angelia Mould, MD  Pediatric Multiple Vit-C-FA (FLINSTONES GUMMIES OMEGA-3 DHA) CHEW Chew 2 tablets by mouth at bedtime.     [provider]  rosuvastatin (CRESTOR) 10 MG tablet Take 10 mg by mouth at bedtime.     [provider]    Family History Family History  Problem Relation Age of Onset  .  Cancer Mother        breast  . Hypertension Father     Social History Social History   Tobacco Use  . Smoking status: Never Smoker  . Smokeless tobacco: Never Used  Substance Use Topics  . Alcohol use: No    Alcohol/week: 0.0 standard drinks  . Drug use: No     Allergies   Pollen extract   Review of Systems Review of Systems  All other systems reviewed and are negative.    Physical Exam Updated Vital Signs BP 125/77   Temp 97.6 F (36.4 C) (Oral)   Resp (!) 24   SpO2 97%   Physical Exam  Constitutional: She is oriented to person, place, and time. She appears well-developed and well-nourished. No distress.  HENT:  Head: Atraumatic.  Eyes: Pupils are equal, round, and reactive to light. Conjunctivae and EOM are normal.  Neck: Normal range of motion. Neck supple.  No nuchal rigidity  Cardiovascular: Normal rate and regular rhythm.  Pulmonary/Chest: Effort normal and breath sounds normal.  Abdominal: Soft. Bowel sounds are normal. She exhibits no distension. There is no tenderness.  Neurological: She is alert and oriented to person, place, and time.  Skin: No rash noted.  AV fistula on the right upper extremity with palpable bruit and normal thrill  Psychiatric: She has a normal mood and affect.  Nursing note and vitals reviewed.    ED Treatments / Results  Labs (all labs ordered are listed, but only abnormal results are displayed) Labs Reviewed  CBC WITH  DIFFERENTIAL/PLATELET - Abnormal; Notable for the following components:      Result Value   Hemoglobin 10.6 (*)    HCT 34.3 (*)    RDW 20.1 (*)    All other components within normal limits  COMPREHENSIVE METABOLIC PANEL - Abnormal; Notable for the following components:   Potassium 7.4 (*)    Glucose, Bld 214 (*)    BUN 45 (*)    Creatinine, Ser 11.17 (*)    Calcium 8.2 (*)    GFR calc non Af Amer 4 (*)    GFR calc Af Amer 4 (*)    Anion gap 16 (*)    All other components within normal limits  CBG MONITORING, ED - Abnormal; Notable for the following components:   Glucose-Capillary 196 (*)    All other components within normal limits  CBG MONITORING, ED - Abnormal; Notable for the following components:   Glucose-Capillary 395 (*)    All other components within normal limits  LIPASE, BLOOD  URINALYSIS, ROUTINE W REFLEX MICROSCOPIC  I-STAT BETA HCG BLOOD, ED (MC, WL, AP ONLY)    EKG EKG Interpretation  Date/Time:  Thursday April 15 2018 11:17:26 EDT Ventricular Rate:  64 PR Interval:    QRS Duration: 87 QT Interval:  482 QTC Calculation: 498 R Axis:   84 Text Interpretation:  Age not entered, assumed to be  42 years old for purpose of ECG interpretation Sinus rhythm Borderline prolonged PR interval Borderline repolarization abnormality Borderline prolonged QT interval Confirmed by Gerlene Fee [phone]) on 04/15/2018 1:12:35 PM   Radiology No results found.  Procedures .Critical Care Performed by: Domenic Moras, PA-C Authorized by: Domenic Moras, PA-C   Critical care provider statement:    Critical care time (minutes):  45   Critical care was time spent personally by me on the following activities:  Discussions with consultants, evaluation of patient's response to treatment, examination of patient, ordering and performing treatments and interventions,  ordering and review of laboratory studies, ordering and review of radiographic studies, pulse oximetry, re-evaluation of  patient's condition, obtaining history from patient or surrogate and review of old charts   (including critical care time)  Medications Ordered in ED Medications  calcium gluconate inj 10% (1 g) URGENT USE ONLY! (1 g Intravenous Given 04/15/18 1210)  albuterol (PROVENTIL) (2.5 MG/3ML) 0.083% nebulizer solution 10 mg (10 mg Nebulization Given 04/15/18 1202)  sodium bicarbonate injection 50 mEq (50 mEq Intravenous Given 04/15/18 1222)  insulin aspart (novoLOG) injection 5 Units (5 Units Intravenous Given 04/15/18 1243)  dextrose 50 % solution 50 mL (50 mLs Intravenous Given 04/15/18 1245)     Initial Impression / Assessment and Plan / ED Course  I have reviewed the triage vital signs and the nursing notes.  Pertinent labs & imaging results that were available during my care of the patient were reviewed by me and considered in my medical decision making (see chart for details).     BP 129/69   Temp 97.6 F (36.4 C) (Oral)   Resp 18   SpO2 97%    Final Clinical Impressions(s) / ED Diagnoses   Final diagnoses:  Hyperkalemia  Intractable vomiting with nausea, unspecified vomiting type  Dialysis patient [facility])    ED Discharge Orders    None     10:56 AM Patient here with acute onset of nausea vomiting on her way to receive dialysis today.  She appears uncomfortable but has no reproducible tenderness on initial exam.  She is afebrile, vital signs stable.  Initial CBG is 196.  Pregnancy test negative.  Work-up initiated, EKG ordered.  11:55 AM EKG without significant peak T however, patient's potassium is markedly elevated at 7.4 without visible hemolysis.  Patient would need emergent treatment to stabilize her myocardium.  Patient given calcium gluconate, albuterol, insulin, dextrose, and sodium bicarb.  Will consult nephrology for emergent dialysis and will have patient admitted for further care.  12:56 PM Appreciate consultation from oncall nephrologist Dr. Florene Glen who felt pt will  need to be dialyze.  He request medicine for admission and will schedule pt for dialysis today.    1:28 PM appreciate consultation from [facility] who agrees to see and admit pt for further care.   Domenic Moras, PA-C 04/15/18 1330    Maudie Flakes, MD 04/15/18 [phone]

## 2018-04-15 NOTE — Progress Notes (Signed)
Pt CBG read 198. PT states she gives herself her meal coverage for bedtime within an hour.

## 2018-04-15 NOTE — ED Triage Notes (Signed)
Patient arrived via EMS. She was on her way to dialysis(T,TH,S) when she felt nausea and started to vomit. She reports she was too weak to drive afterwards. She vomitted again with EMS, they gave her zofran.  On arrival, patient is A&O X4, reports that her nausea has resolved. Patient has a self administering insulin pump and a glucose monitor.

## 2018-04-15 NOTE — ED Notes (Signed)
Patient states that she does not urinate

## 2018-04-15 NOTE — ED Notes (Signed)
Pt advised she does not make urine. Unable to give urine sample.

## 2018-04-16 DIAGNOSIS — N186 End stage renal disease: Secondary | ICD-10-CM

## 2018-04-16 DIAGNOSIS — Z992 Dependence on renal dialysis: Secondary | ICD-10-CM

## 2018-04-16 DIAGNOSIS — E875 Hyperkalemia: Secondary | ICD-10-CM | POA: Diagnosis not present

## 2018-04-16 LAB — CBC
HCT: 33.7 % — ABNORMAL LOW (ref 36.0–46.0)
Hemoglobin: 10.4 g/dL — ABNORMAL LOW (ref 12.0–15.0)
MCH: 25.9 pg — AB (ref 26.0–34.0)
MCHC: 30.9 g/dL (ref 30.0–36.0)
MCV: 84 fL (ref 78.0–100.0)
Platelets: 190 10*3/uL (ref 150–400)
RBC: 4.01 MIL/uL (ref 3.87–5.11)
RDW: 19.9 % — ABNORMAL HIGH (ref 11.5–15.5)
WBC: 5.3 10*3/uL (ref 4.0–10.5)

## 2018-04-16 LAB — COMPREHENSIVE METABOLIC PANEL
ALBUMIN: 3.8 g/dL (ref 3.5–5.0)
ALT: 26 U/L (ref 0–44)
ANION GAP: 12 (ref 5–15)
AST: 20 U/L (ref 15–41)
Alkaline Phosphatase: 60 U/L (ref 38–126)
BUN: 26 mg/dL — ABNORMAL HIGH (ref 6–20)
CALCIUM: 9.3 mg/dL (ref 8.9–10.3)
CHLORIDE: 92 mmol/L — AB (ref 98–111)
CO2: 34 mmol/L — AB (ref 22–32)
Creatinine, Ser: 6.75 mg/dL — ABNORMAL HIGH (ref 0.44–1.00)
GFR calc Af Amer: 8 mL/min — ABNORMAL LOW (ref >60)
GFR calc non Af Amer: 7 mL/min — ABNORMAL LOW (ref >60)
GLUCOSE: 195 mg/dL — AB (ref 70–99)
Potassium: 4.8 mmol/L (ref 3.5–5.1)
Sodium: 138 mmol/L (ref 135–145)
Total Bilirubin: 0.9 mg/dL (ref 0.3–1.2)
Total Protein: 6.9 g/dL (ref 6.5–8.1)

## 2018-04-16 LAB — PROTIME-INR
INR: 1.15
Prothrombin Time: 14.6 seconds (ref 11.4–15.2)

## 2018-04-16 LAB — HIV ANTIBODY (ROUTINE TESTING W REFLEX): HIV Screen 4th Generation wRfx: NONREACTIVE

## 2018-04-16 LAB — GLUCOSE, CAPILLARY
GLUCOSE-CAPILLARY: 150 mg/dL — AB (ref 70–99)
Glucose-Capillary: 185 mg/dL — ABNORMAL HIGH (ref 70–99)

## 2018-04-16 LAB — PHOSPHORUS: PHOSPHORUS: 7 mg/dL — AB (ref 2.5–4.6)

## 2018-04-16 LAB — APTT: aPTT: 31 seconds (ref 24–36)

## 2018-04-16 LAB — MAGNESIUM: Magnesium: 2.2 mg/dL (ref 1.7–2.4)

## 2018-04-16 NOTE — Plan of Care (Signed)
Pt alert and oriented x4. Pt verbalized understanding plan of care. Pt very efficient in managing health-related needs. Pt diagnostic tests improving, no complications arising. Pt able to remain free of injury. Diet controlled to manage health. Pt progressing towards discharge.

## 2018-04-16 NOTE — Progress Notes (Signed)
[facility] NEPHROLOGY PROGRESS NOTE  Assessment/ Plan: Pt is a 42 y.o. yo female ESRD on HD admitted with nausea and hyperkalemia. AF TTS 3.5h 180NRe 425/A1.5x EDW 54kg 2K/2.25Ca Profile 4 Na Linear Using R AVG Heparin bolus 2000 U -Hectorol 52mcg IV TIW -Mircera 75 mcg IV q 2 weeks (last 9/5) -Sensipar 60 mg TIW  Assessment/Plan:  #Hyperkalemia due to dietary intake: Patient was eating watermelon and is been is at home.  Educated on low potassium diet.  Received dialysis yesterday with repeat serum potassium level of 4.8 today.  Patient is clinically asymptomatic.  # ESRD: TTS.  Patient is being discharged home today.  Plan for next dialysis tomorrow at outpatient center.  # Anemia: Hemoglobin is stable.  # Secondary hyperparathyroidism: Continue Sensipar and binders.  # HTN/volume: Euvolemic on exam.  Blood pressure acceptable.  Patient is in the process of being discharged home today.  I advised her to continue her outpatient dialysis.   D/w primary team.  Subjective: Seen and examined at bedside.  Ready to go home today.  Denies headache, dizziness, nausea, vomiting, chest pain, shortness of breath. Objective Vital signs in last 24 hours: Vitals:   04/16/18 0048 04/16/18 0652 04/16/18 0700 04/16/18 0927  BP: (!) 162/75  (!) 165/88 (!) 128/92  Pulse: 87  83 81  Resp: 16  17 16   Temp: 98.3 F (36.8 C)  99.3 F (37.4 C) 98.3 F (36.8 C)  TempSrc: Oral   Oral  SpO2: 98%  98% 100%  Weight:  54.3 kg    Height:       Weight change:   Intake/Output Summary (Last 24 hours) at 04/16/2018 1008 Last data filed at 04/16/2018 0900 Gross per 24 hour  Intake 360 ml  Output 2300 ml  Net -1940 ml       Labs: Basic Metabolic Panel: Recent Labs  Lab 04/15/18 0944 04/15/18 2048 04/16/18 0434  NA 138 135 138  K 7.4* 3.8 4.8  CL 98 93* 92*  CO2 24 30 34*  GLUCOSE 214* 201* 195*  BUN 45* 16 26*  CREATININE 11.17* 5.57* 6.75*  CALCIUM 8.2* 9.0 9.3  PHOS   --   --  7.0*   Liver Function Tests: Recent Labs  Lab 04/15/18 0944 04/16/18 0434  AST 22 20  ALT 30 26  ALKPHOS 57 60  BILITOT 0.8 0.9  PROT 7.2 6.9  ALBUMIN 4.2 3.8   Recent Labs  Lab 04/15/18 0944  LIPASE 34   No results for input(s): AMMONIA in the last 168 hours. CBC: Recent Labs  Lab 04/15/18 0944 04/16/18 0434  WBC 4.4 5.3  NEUTROABS 3.4  --   HGB 10.6* 10.4*  HCT 34.3* 33.7*  MCV 85.3 84.0  PLT 152 190   Cardiac Enzymes: No results for input(s): CKTOTAL, CKMB, CKMBINDEX, TROPONINI in the last 168 hours. CBG: Recent Labs  Lab 04/15/18 1012 04/15/18 1249 04/15/18 1915 04/16/18 0214 04/16/18 0654  GLUCAP 196* 395* 220* 150* 185*    Iron Studies: No results for input(s): IRON, TIBC, TRANSFERRIN, FERRITIN in the last 72 hours. Studies/Results: Dg Chest 2 View  Result Date: 04/15/2018 CLINICAL DATA:  End stage renal disease with nausea and vomiting. EXAM: CHEST - 2 VIEW COMPARISON:  09/11/2014 FINDINGS: Lungs are adequately inflated with hazy bilateral perihilar opacification likely mild interstitial edema. No evidence of effusion. No lobar consolidation. Cardiomediastinal silhouette and remainder of the exam is unchanged. IMPRESSION: Findings likely representing mild interstitial edema. Electronically Signed  By: Marin Olp M.D.   On: 04/15/2018 14:13    Medications: Infusions: . sodium chloride    . sodium chloride      Scheduled Medications: . amLODipine  10 mg Oral QHS  . aspirin EC  81 mg Oral QHS  . calcium acetate  667 mg Oral TID WC  . Chlorhexidine Gluconate Cloth  6 each Topical Q0600  . cinacalcet  60 mg Oral Q T,Th,Sat-1800  . [START ON 04/17/2018] doxercalciferol  5 mcg Intravenous Q T,Th,Sa-HD  . heparin  5,000 Units Subcutaneous Q8H  . insulin pump   Subcutaneous TID AC, HS, 0200  . labetalol  300 mg Oral TID WC  . multivitamin  1 tablet Oral QHS  . omega-3 acid ethyl esters  1 g Oral BID  . rosuvastatin  10 mg Oral QHS     have reviewed scheduled and prn medications.  Physical Exam: General:NAD, comfortable Heart:RRR, s1s2 nl Lungs:clear b/l, no crackle Abdomen:soft, Non-tender, non-distended Extremities:No edema Dialysis Access: Right AV graft has good thrill and bruit.  Devora Tortorella Prasad Darshawn Boateng 04/16/2018,10:08 AM  LOS: 0 days

## 2018-04-16 NOTE — Progress Notes (Signed)
Pt stable, ambulatory, and verbalizes understanding of d/c instructions.   Pt has all belongings in her possession.

## 2018-04-16 NOTE — Discharge Summary (Signed)
Physician Discharge Summary  [name]:[id number] DOB: 1976-02-25 DOA: 04/15/2018  PCP: Benito Mccreedy, MD  Admit date: 04/15/2018 Discharge date: 04/16/2018  Admitted From: home Discharge disposition: home   Recommendations for Outpatient Follow-Up:   1. Be sure to go to HD regularly    Discharge Diagnosis:   Principal Problem:   Hyperkalemia Active Problems:   HLD (hyperlipidemia)   ESRD on hemodialysis ([place])   Diabetes mellitus type I ([place])   History of diabetic retinopathy   Benign essential HTN   Nausea    Discharge Condition: Improved.  Diet recommendation: renal/carb mod  Wound care: None.  Code status: Full.   History of Present Illness:  [name] is a 42 y.o. female with medical history significant of with history of ESRD on hemodialysis Tuesday, Thursday and Saturday, diabetes mellitus type 1, essential hypertension, hyperlipidemia came to the ER for evaluation of nausea.  Patient states she missed her last hemodialysis last Thursday and then went to dialysis last Saturday and this past Tuesday.  Today while she was on her way to dialysis she started feeling very nauseous and overall not well therefore called EMS and came to the ER.  She denied any chest pain, lightheadedness, dizziness, shortness of breath and other complaints. In the ER patient was noted to have potassium of 7.4, BUN 45 and creatinine of 11.7.  EKG did not show any acute ST-T changes.  She was given calcium gluconate, albuterol, 1 amp of D50 and insulin.  Nephrology was consulted for dialysis.  When I saw the patient at bedside she was asymptomatic and did not have any complaints at all.    Hospital Course by Problem:     Hyperkalemia without any acute ST-T changes ESRD on hemodialysis T/TH/Saturday Nausea, improved -K resolved and pateint back to baseline, ready to go home  Diabetes mellitus type 1, insulin-dependent - resume home pump per  patient  Essential hypertension -Continue Norvasc 10 mg daily  Hyperlipidemia -Continue Crestor    Medical Consultants:   renal    Discharge Exam:   Vitals:   04/16/18 0048 04/16/18 0700  BP: (!) 162/75 (!) 165/88  Pulse: 87 83  Resp: 16 17  Temp: 98.3 F (36.8 C) 99.3 F (37.4 C)  SpO2: 98% 98%   Vitals:   04/15/18 1905 04/16/18 0048 04/16/18 0652 04/16/18 0700  BP: (!) 172/96 (!) 162/75  (!) 165/88  Pulse: 93 87  83  Resp: 18 16  17   Temp: 98.3 F (36.8 C) 98.3 F (36.8 C)  99.3 F (37.4 C)  TempSrc: Oral Oral    SpO2: 99% 98%  98%  Weight: 54.3 kg  54.3 kg   Height: 5\' 4"  (1.626 m)       General exam: Appears calm and comfortable.    The results of significant diagnostics from this hospitalization (including imaging, microbiology, ancillary and laboratory) are listed below for reference.     Procedures and Diagnostic Studies:   Dg Chest 2 View  Result Date: 04/15/2018 CLINICAL DATA:  End stage renal disease with nausea and vomiting. EXAM: CHEST - 2 VIEW COMPARISON:  09/11/2014 FINDINGS: Lungs are adequately inflated with hazy bilateral perihilar opacification likely mild interstitial edema. No evidence of effusion. No lobar consolidation. Cardiomediastinal silhouette and remainder of the exam is unchanged. IMPRESSION: Findings likely representing mild interstitial edema. Electronically Signed   By: Marin Olp M.D.   On: 04/15/2018 14:13     Labs:   Basic  Metabolic Panel: Recent Labs  Lab 04/15/18 0944 04/15/18 2048 04/16/18 0434  NA 138 135 138  K 7.4* 3.8 4.8  CL 98 93* 92*  CO2 24 30 34*  GLUCOSE 214* 201* 195*  BUN 45* 16 26*  CREATININE 11.17* 5.57* 6.75*  CALCIUM 8.2* 9.0 9.3  MG  --   --  2.2  PHOS  --   --  7.0*   GFR Estimated Creatinine Clearance: 9.6 mL/min (A) (by C-G formula based on SCr of 6.75 mg/dL (H)). Liver Function Tests: Recent Labs  Lab 04/15/18 0944 04/16/18 0434  AST 22 20  ALT 30 26  ALKPHOS 57 60    BILITOT 0.8 0.9  PROT 7.2 6.9  ALBUMIN 4.2 3.8   Recent Labs  Lab 04/15/18 0944  LIPASE 34   No results for input(s): AMMONIA in the last 168 hours. Coagulation profile Recent Labs  Lab 04/16/18 0434  INR 1.15    CBC: Recent Labs  Lab 04/15/18 0944 04/16/18 0434  WBC 4.4 5.3  NEUTROABS 3.4  --   HGB 10.6* 10.4*  HCT 34.3* 33.7*  MCV 85.3 84.0  PLT 152 190   Cardiac Enzymes: No results for input(s): CKTOTAL, CKMB, CKMBINDEX, TROPONINI in the last 168 hours. BNP: Invalid input(s): POCBNP CBG: Recent Labs  Lab 04/15/18 1012 04/15/18 1249 04/15/18 1915 04/16/18 0214 04/16/18 0654  GLUCAP 196* 395* 220* 150* 185*   D-Dimer No results for input(s): DDIMER in the last 72 hours. Hgb A1c No results for input(s): HGBA1C in the last 72 hours. Lipid Profile No results for input(s): CHOL, HDL, LDLCALC, TRIG, CHOLHDL, LDLDIRECT in the last 72 hours. Thyroid function studies No results for input(s): TSH, T4TOTAL, T3FREE, THYROIDAB in the last 72 hours.  Invalid input(s): FREET3 Anemia work up No results for input(s): VITAMINB12, FOLATE, FERRITIN, TIBC, IRON, RETICCTPCT in the last 72 hours. Microbiology No results found for this or any previous visit (from the past 240 hour(s)).   Discharge Instructions:   Discharge Instructions    Discharge instructions   Complete by:  As directed    Renal carb mod diet HD in AM   Increase activity slowly   Complete by:  As directed      Allergies as of 04/16/2018      Reactions   Pollen Extract Other (See Comments)   Watery eyes      Medication List    STOP taking these medications   oxyCODONE 5 MG immediate release tablet Commonly known as:  Oxy IR/ROXICODONE     TAKE these medications   amLODipine 10 MG tablet Commonly known as:  NORVASC Take 10 mg by mouth at bedtime.   aspirin 81 MG EC tablet Take 81 mg by mouth at bedtime. Swallow whole.   cinacalcet 30 MG tablet Commonly known as:  SENSIPAR Take  30 mg by mouth See admin instructions. Taking only on Dialysis days Tue, Thur, Sat.   ethyl chloride spray Apply 1 application topically as needed (prior to port access).   FLINSTONES GUMMIES OMEGA-3 DHA Chew Chew 2 tablets by mouth at bedtime.   GLUCAGON EMERGENCY 1 MG injection Generic drug:  glucagon Inject 1 mg into the muscle once as needed (for severe low blood sugar).   insulin aspart 100 UNIT/ML injection Commonly known as:  novoLOG Inject 50 Units into the skin See admin instructions. Use with insulin pump daily   insulin pump Soln Inject into the skin continuous. Novolog   Basal rate 0.29 units/ hour  labetalol 300 MG tablet Commonly known as:  NORMODYNE Take 300 mg by mouth 3 (three) times daily with meals.   loratadine 10 MG tablet Commonly known as:  CLARITIN Take 10 mg by mouth daily as needed for allergies.   PHOSLO 667 MG capsule Generic drug:  calcium acetate Take 667 mg by mouth 3 (three) times daily with meals. May take an additional 667 mg dose with snacks   rosuvastatin 10 MG tablet Commonly known as:  CRESTOR Take 10 mg by mouth at bedtime.      Follow-up Information    Osei-Bonsu, Iona Beard, MD Follow up in 1 week(s).   Specialty:  Internal Medicine Contact information: [address] [place] [address] [phone]            Time coordinating discharge: 25 min  Signed:  Geradine Girt  [facility] 04/16/2018, 9:12 AM

## 2018-04-17 DIAGNOSIS — N2581 Secondary hyperparathyroidism of renal origin: Secondary | ICD-10-CM | POA: Diagnosis not present

## 2018-04-17 DIAGNOSIS — E1029 Type 1 diabetes mellitus with other diabetic kidney complication: Secondary | ICD-10-CM | POA: Diagnosis not present

## 2018-04-17 DIAGNOSIS — D631 Anemia in chronic kidney disease: Secondary | ICD-10-CM | POA: Diagnosis not present

## 2018-04-17 DIAGNOSIS — N186 End stage renal disease: Secondary | ICD-10-CM | POA: Diagnosis not present

## 2018-04-17 DIAGNOSIS — Z23 Encounter for immunization: Secondary | ICD-10-CM | POA: Diagnosis not present

## 2018-04-20 DIAGNOSIS — E1029 Type 1 diabetes mellitus with other diabetic kidney complication: Secondary | ICD-10-CM | POA: Diagnosis not present

## 2018-04-20 DIAGNOSIS — Z992 Dependence on renal dialysis: Secondary | ICD-10-CM | POA: Diagnosis not present

## 2018-04-20 DIAGNOSIS — D631 Anemia in chronic kidney disease: Secondary | ICD-10-CM | POA: Diagnosis not present

## 2018-04-20 DIAGNOSIS — N186 End stage renal disease: Secondary | ICD-10-CM | POA: Diagnosis not present

## 2018-04-20 DIAGNOSIS — N2581 Secondary hyperparathyroidism of renal origin: Secondary | ICD-10-CM | POA: Diagnosis not present

## 2018-04-20 DIAGNOSIS — R739 Hyperglycemia, unspecified: Secondary | ICD-10-CM | POA: Diagnosis not present

## 2018-04-20 DIAGNOSIS — D509 Iron deficiency anemia, unspecified: Secondary | ICD-10-CM | POA: Diagnosis not present

## 2018-04-22 DIAGNOSIS — D631 Anemia in chronic kidney disease: Secondary | ICD-10-CM | POA: Diagnosis not present

## 2018-04-22 DIAGNOSIS — D509 Iron deficiency anemia, unspecified: Secondary | ICD-10-CM | POA: Diagnosis not present

## 2018-04-22 DIAGNOSIS — R739 Hyperglycemia, unspecified: Secondary | ICD-10-CM | POA: Diagnosis not present

## 2018-04-22 DIAGNOSIS — E1029 Type 1 diabetes mellitus with other diabetic kidney complication: Secondary | ICD-10-CM | POA: Diagnosis not present

## 2018-04-22 DIAGNOSIS — N2581 Secondary hyperparathyroidism of renal origin: Secondary | ICD-10-CM | POA: Diagnosis not present

## 2018-04-22 DIAGNOSIS — N186 End stage renal disease: Secondary | ICD-10-CM | POA: Diagnosis not present

## 2018-04-24 DIAGNOSIS — N2581 Secondary hyperparathyroidism of renal origin: Secondary | ICD-10-CM | POA: Diagnosis not present

## 2018-04-24 DIAGNOSIS — N186 End stage renal disease: Secondary | ICD-10-CM | POA: Diagnosis not present

## 2018-04-24 DIAGNOSIS — D509 Iron deficiency anemia, unspecified: Secondary | ICD-10-CM | POA: Diagnosis not present

## 2018-04-24 DIAGNOSIS — D631 Anemia in chronic kidney disease: Secondary | ICD-10-CM | POA: Diagnosis not present

## 2018-04-24 DIAGNOSIS — R739 Hyperglycemia, unspecified: Secondary | ICD-10-CM | POA: Diagnosis not present

## 2018-04-24 DIAGNOSIS — E1029 Type 1 diabetes mellitus with other diabetic kidney complication: Secondary | ICD-10-CM | POA: Diagnosis not present

## 2018-04-27 DIAGNOSIS — E1029 Type 1 diabetes mellitus with other diabetic kidney complication: Secondary | ICD-10-CM | POA: Diagnosis not present

## 2018-04-27 DIAGNOSIS — N186 End stage renal disease: Secondary | ICD-10-CM | POA: Diagnosis not present

## 2018-04-27 DIAGNOSIS — R739 Hyperglycemia, unspecified: Secondary | ICD-10-CM | POA: Diagnosis not present

## 2018-04-27 DIAGNOSIS — N2581 Secondary hyperparathyroidism of renal origin: Secondary | ICD-10-CM | POA: Diagnosis not present

## 2018-04-27 DIAGNOSIS — D509 Iron deficiency anemia, unspecified: Secondary | ICD-10-CM | POA: Diagnosis not present

## 2018-04-27 DIAGNOSIS — D631 Anemia in chronic kidney disease: Secondary | ICD-10-CM | POA: Diagnosis not present

## 2018-04-29 DIAGNOSIS — D631 Anemia in chronic kidney disease: Secondary | ICD-10-CM | POA: Diagnosis not present

## 2018-04-29 DIAGNOSIS — R739 Hyperglycemia, unspecified: Secondary | ICD-10-CM | POA: Diagnosis not present

## 2018-04-29 DIAGNOSIS — N186 End stage renal disease: Secondary | ICD-10-CM | POA: Diagnosis not present

## 2018-04-29 DIAGNOSIS — N2581 Secondary hyperparathyroidism of renal origin: Secondary | ICD-10-CM | POA: Diagnosis not present

## 2018-04-29 DIAGNOSIS — D509 Iron deficiency anemia, unspecified: Secondary | ICD-10-CM | POA: Diagnosis not present

## 2018-04-29 DIAGNOSIS — E1029 Type 1 diabetes mellitus with other diabetic kidney complication: Secondary | ICD-10-CM | POA: Diagnosis not present

## 2018-05-01 DIAGNOSIS — N186 End stage renal disease: Secondary | ICD-10-CM | POA: Diagnosis not present

## 2018-05-01 DIAGNOSIS — D631 Anemia in chronic kidney disease: Secondary | ICD-10-CM | POA: Diagnosis not present

## 2018-05-01 DIAGNOSIS — N2581 Secondary hyperparathyroidism of renal origin: Secondary | ICD-10-CM | POA: Diagnosis not present

## 2018-05-01 DIAGNOSIS — D509 Iron deficiency anemia, unspecified: Secondary | ICD-10-CM | POA: Diagnosis not present

## 2018-05-01 DIAGNOSIS — E1029 Type 1 diabetes mellitus with other diabetic kidney complication: Secondary | ICD-10-CM | POA: Diagnosis not present

## 2018-05-01 DIAGNOSIS — R739 Hyperglycemia, unspecified: Secondary | ICD-10-CM | POA: Diagnosis not present

## 2018-05-04 DIAGNOSIS — N2581 Secondary hyperparathyroidism of renal origin: Secondary | ICD-10-CM | POA: Diagnosis not present

## 2018-05-04 DIAGNOSIS — D631 Anemia in chronic kidney disease: Secondary | ICD-10-CM | POA: Diagnosis not present

## 2018-05-04 DIAGNOSIS — R739 Hyperglycemia, unspecified: Secondary | ICD-10-CM | POA: Diagnosis not present

## 2018-05-04 DIAGNOSIS — E1029 Type 1 diabetes mellitus with other diabetic kidney complication: Secondary | ICD-10-CM | POA: Diagnosis not present

## 2018-05-04 DIAGNOSIS — D509 Iron deficiency anemia, unspecified: Secondary | ICD-10-CM | POA: Diagnosis not present

## 2018-05-04 DIAGNOSIS — N186 End stage renal disease: Secondary | ICD-10-CM | POA: Diagnosis not present

## 2018-05-06 DIAGNOSIS — D631 Anemia in chronic kidney disease: Secondary | ICD-10-CM | POA: Diagnosis not present

## 2018-05-06 DIAGNOSIS — E1029 Type 1 diabetes mellitus with other diabetic kidney complication: Secondary | ICD-10-CM | POA: Diagnosis not present

## 2018-05-06 DIAGNOSIS — R739 Hyperglycemia, unspecified: Secondary | ICD-10-CM | POA: Diagnosis not present

## 2018-05-06 DIAGNOSIS — N2581 Secondary hyperparathyroidism of renal origin: Secondary | ICD-10-CM | POA: Diagnosis not present

## 2018-05-06 DIAGNOSIS — D509 Iron deficiency anemia, unspecified: Secondary | ICD-10-CM | POA: Diagnosis not present

## 2018-05-06 DIAGNOSIS — N186 End stage renal disease: Secondary | ICD-10-CM | POA: Diagnosis not present

## 2018-05-08 DIAGNOSIS — D509 Iron deficiency anemia, unspecified: Secondary | ICD-10-CM | POA: Diagnosis not present

## 2018-05-08 DIAGNOSIS — R739 Hyperglycemia, unspecified: Secondary | ICD-10-CM | POA: Diagnosis not present

## 2018-05-08 DIAGNOSIS — D631 Anemia in chronic kidney disease: Secondary | ICD-10-CM | POA: Diagnosis not present

## 2018-05-08 DIAGNOSIS — N2581 Secondary hyperparathyroidism of renal origin: Secondary | ICD-10-CM | POA: Diagnosis not present

## 2018-05-08 DIAGNOSIS — E1029 Type 1 diabetes mellitus with other diabetic kidney complication: Secondary | ICD-10-CM | POA: Diagnosis not present

## 2018-05-08 DIAGNOSIS — N186 End stage renal disease: Secondary | ICD-10-CM | POA: Diagnosis not present

## 2018-05-11 DIAGNOSIS — D631 Anemia in chronic kidney disease: Secondary | ICD-10-CM | POA: Diagnosis not present

## 2018-05-11 DIAGNOSIS — D509 Iron deficiency anemia, unspecified: Secondary | ICD-10-CM | POA: Diagnosis not present

## 2018-05-11 DIAGNOSIS — N2581 Secondary hyperparathyroidism of renal origin: Secondary | ICD-10-CM | POA: Diagnosis not present

## 2018-05-11 DIAGNOSIS — N186 End stage renal disease: Secondary | ICD-10-CM | POA: Diagnosis not present

## 2018-05-11 DIAGNOSIS — E1029 Type 1 diabetes mellitus with other diabetic kidney complication: Secondary | ICD-10-CM | POA: Diagnosis not present

## 2018-05-11 DIAGNOSIS — R739 Hyperglycemia, unspecified: Secondary | ICD-10-CM | POA: Diagnosis not present

## 2018-05-13 DIAGNOSIS — D631 Anemia in chronic kidney disease: Secondary | ICD-10-CM | POA: Diagnosis not present

## 2018-05-13 DIAGNOSIS — R739 Hyperglycemia, unspecified: Secondary | ICD-10-CM | POA: Diagnosis not present

## 2018-05-13 DIAGNOSIS — E1029 Type 1 diabetes mellitus with other diabetic kidney complication: Secondary | ICD-10-CM | POA: Diagnosis not present

## 2018-05-13 DIAGNOSIS — N186 End stage renal disease: Secondary | ICD-10-CM | POA: Diagnosis not present

## 2018-05-13 DIAGNOSIS — N2581 Secondary hyperparathyroidism of renal origin: Secondary | ICD-10-CM | POA: Diagnosis not present

## 2018-05-13 DIAGNOSIS — D509 Iron deficiency anemia, unspecified: Secondary | ICD-10-CM | POA: Diagnosis not present

## 2018-05-15 DIAGNOSIS — D509 Iron deficiency anemia, unspecified: Secondary | ICD-10-CM | POA: Diagnosis not present

## 2018-05-15 DIAGNOSIS — N186 End stage renal disease: Secondary | ICD-10-CM | POA: Diagnosis not present

## 2018-05-15 DIAGNOSIS — E1029 Type 1 diabetes mellitus with other diabetic kidney complication: Secondary | ICD-10-CM | POA: Diagnosis not present

## 2018-05-15 DIAGNOSIS — D631 Anemia in chronic kidney disease: Secondary | ICD-10-CM | POA: Diagnosis not present

## 2018-05-15 DIAGNOSIS — R739 Hyperglycemia, unspecified: Secondary | ICD-10-CM | POA: Diagnosis not present

## 2018-05-15 DIAGNOSIS — N2581 Secondary hyperparathyroidism of renal origin: Secondary | ICD-10-CM | POA: Diagnosis not present

## 2018-05-18 DIAGNOSIS — N2581 Secondary hyperparathyroidism of renal origin: Secondary | ICD-10-CM | POA: Diagnosis not present

## 2018-05-18 DIAGNOSIS — R739 Hyperglycemia, unspecified: Secondary | ICD-10-CM | POA: Diagnosis not present

## 2018-05-18 DIAGNOSIS — D631 Anemia in chronic kidney disease: Secondary | ICD-10-CM | POA: Diagnosis not present

## 2018-05-18 DIAGNOSIS — E1029 Type 1 diabetes mellitus with other diabetic kidney complication: Secondary | ICD-10-CM | POA: Diagnosis not present

## 2018-05-18 DIAGNOSIS — N186 End stage renal disease: Secondary | ICD-10-CM | POA: Diagnosis not present

## 2018-05-18 DIAGNOSIS — D509 Iron deficiency anemia, unspecified: Secondary | ICD-10-CM | POA: Diagnosis not present

## 2018-05-20 DIAGNOSIS — D509 Iron deficiency anemia, unspecified: Secondary | ICD-10-CM | POA: Diagnosis not present

## 2018-05-20 DIAGNOSIS — N2581 Secondary hyperparathyroidism of renal origin: Secondary | ICD-10-CM | POA: Diagnosis not present

## 2018-05-20 DIAGNOSIS — D631 Anemia in chronic kidney disease: Secondary | ICD-10-CM | POA: Diagnosis not present

## 2018-05-20 DIAGNOSIS — E1029 Type 1 diabetes mellitus with other diabetic kidney complication: Secondary | ICD-10-CM | POA: Diagnosis not present

## 2018-05-20 DIAGNOSIS — N186 End stage renal disease: Secondary | ICD-10-CM | POA: Diagnosis not present

## 2018-05-20 DIAGNOSIS — R739 Hyperglycemia, unspecified: Secondary | ICD-10-CM | POA: Diagnosis not present

## 2018-05-21 DIAGNOSIS — E1029 Type 1 diabetes mellitus with other diabetic kidney complication: Secondary | ICD-10-CM | POA: Diagnosis not present

## 2018-05-21 DIAGNOSIS — Z992 Dependence on renal dialysis: Secondary | ICD-10-CM | POA: Diagnosis not present

## 2018-05-21 DIAGNOSIS — N186 End stage renal disease: Secondary | ICD-10-CM | POA: Diagnosis not present

## 2018-05-22 DIAGNOSIS — N186 End stage renal disease: Secondary | ICD-10-CM | POA: Diagnosis not present

## 2018-05-22 DIAGNOSIS — D631 Anemia in chronic kidney disease: Secondary | ICD-10-CM | POA: Diagnosis not present

## 2018-05-22 DIAGNOSIS — N2581 Secondary hyperparathyroidism of renal origin: Secondary | ICD-10-CM | POA: Diagnosis not present

## 2018-05-22 DIAGNOSIS — E1029 Type 1 diabetes mellitus with other diabetic kidney complication: Secondary | ICD-10-CM | POA: Diagnosis not present

## 2018-05-24 DIAGNOSIS — E1022 Type 1 diabetes mellitus with diabetic chronic kidney disease: Secondary | ICD-10-CM | POA: Diagnosis not present

## 2018-05-24 DIAGNOSIS — E103593 Type 1 diabetes mellitus with proliferative diabetic retinopathy without macular edema, bilateral: Secondary | ICD-10-CM | POA: Diagnosis not present

## 2018-05-24 DIAGNOSIS — Z992 Dependence on renal dialysis: Secondary | ICD-10-CM | POA: Diagnosis not present

## 2018-05-24 DIAGNOSIS — N186 End stage renal disease: Secondary | ICD-10-CM | POA: Diagnosis not present

## 2018-05-24 DIAGNOSIS — Z794 Long term (current) use of insulin: Secondary | ICD-10-CM | POA: Diagnosis not present

## 2018-05-25 DIAGNOSIS — D631 Anemia in chronic kidney disease: Secondary | ICD-10-CM | POA: Diagnosis not present

## 2018-05-25 DIAGNOSIS — N186 End stage renal disease: Secondary | ICD-10-CM | POA: Diagnosis not present

## 2018-05-25 DIAGNOSIS — N2581 Secondary hyperparathyroidism of renal origin: Secondary | ICD-10-CM | POA: Diagnosis not present

## 2018-05-25 DIAGNOSIS — E1029 Type 1 diabetes mellitus with other diabetic kidney complication: Secondary | ICD-10-CM | POA: Diagnosis not present

## 2018-05-27 DIAGNOSIS — N186 End stage renal disease: Secondary | ICD-10-CM | POA: Diagnosis not present

## 2018-05-27 DIAGNOSIS — N2581 Secondary hyperparathyroidism of renal origin: Secondary | ICD-10-CM | POA: Diagnosis not present

## 2018-05-27 DIAGNOSIS — E1029 Type 1 diabetes mellitus with other diabetic kidney complication: Secondary | ICD-10-CM | POA: Diagnosis not present

## 2018-05-27 DIAGNOSIS — D631 Anemia in chronic kidney disease: Secondary | ICD-10-CM | POA: Diagnosis not present

## 2018-05-29 DIAGNOSIS — D631 Anemia in chronic kidney disease: Secondary | ICD-10-CM | POA: Diagnosis not present

## 2018-05-29 DIAGNOSIS — N2581 Secondary hyperparathyroidism of renal origin: Secondary | ICD-10-CM | POA: Diagnosis not present

## 2018-05-29 DIAGNOSIS — E1029 Type 1 diabetes mellitus with other diabetic kidney complication: Secondary | ICD-10-CM | POA: Diagnosis not present

## 2018-05-29 DIAGNOSIS — N186 End stage renal disease: Secondary | ICD-10-CM | POA: Diagnosis not present

## 2018-06-01 DIAGNOSIS — D631 Anemia in chronic kidney disease: Secondary | ICD-10-CM | POA: Diagnosis not present

## 2018-06-01 DIAGNOSIS — N186 End stage renal disease: Secondary | ICD-10-CM | POA: Diagnosis not present

## 2018-06-01 DIAGNOSIS — E1029 Type 1 diabetes mellitus with other diabetic kidney complication: Secondary | ICD-10-CM | POA: Diagnosis not present

## 2018-06-01 DIAGNOSIS — N2581 Secondary hyperparathyroidism of renal origin: Secondary | ICD-10-CM | POA: Diagnosis not present

## 2018-06-03 DIAGNOSIS — D631 Anemia in chronic kidney disease: Secondary | ICD-10-CM | POA: Diagnosis not present

## 2018-06-03 DIAGNOSIS — E1029 Type 1 diabetes mellitus with other diabetic kidney complication: Secondary | ICD-10-CM | POA: Diagnosis not present

## 2018-06-03 DIAGNOSIS — N2581 Secondary hyperparathyroidism of renal origin: Secondary | ICD-10-CM | POA: Diagnosis not present

## 2018-06-03 DIAGNOSIS — N186 End stage renal disease: Secondary | ICD-10-CM | POA: Diagnosis not present

## 2018-06-05 DIAGNOSIS — N186 End stage renal disease: Secondary | ICD-10-CM | POA: Diagnosis not present

## 2018-06-05 DIAGNOSIS — D631 Anemia in chronic kidney disease: Secondary | ICD-10-CM | POA: Diagnosis not present

## 2018-06-05 DIAGNOSIS — E1029 Type 1 diabetes mellitus with other diabetic kidney complication: Secondary | ICD-10-CM | POA: Diagnosis not present

## 2018-06-05 DIAGNOSIS — N2581 Secondary hyperparathyroidism of renal origin: Secondary | ICD-10-CM | POA: Diagnosis not present

## 2018-06-07 DIAGNOSIS — Z992 Dependence on renal dialysis: Secondary | ICD-10-CM | POA: Diagnosis not present

## 2018-06-07 DIAGNOSIS — E103593 Type 1 diabetes mellitus with proliferative diabetic retinopathy without macular edema, bilateral: Secondary | ICD-10-CM | POA: Diagnosis not present

## 2018-06-07 DIAGNOSIS — E1022 Type 1 diabetes mellitus with diabetic chronic kidney disease: Secondary | ICD-10-CM | POA: Diagnosis not present

## 2018-06-07 DIAGNOSIS — N186 End stage renal disease: Secondary | ICD-10-CM | POA: Diagnosis not present

## 2018-06-08 DIAGNOSIS — E1029 Type 1 diabetes mellitus with other diabetic kidney complication: Secondary | ICD-10-CM | POA: Diagnosis not present

## 2018-06-08 DIAGNOSIS — N186 End stage renal disease: Secondary | ICD-10-CM | POA: Diagnosis not present

## 2018-06-08 DIAGNOSIS — N2581 Secondary hyperparathyroidism of renal origin: Secondary | ICD-10-CM | POA: Diagnosis not present

## 2018-06-08 DIAGNOSIS — D631 Anemia in chronic kidney disease: Secondary | ICD-10-CM | POA: Diagnosis not present

## 2018-06-10 DIAGNOSIS — N186 End stage renal disease: Secondary | ICD-10-CM | POA: Diagnosis not present

## 2018-06-10 DIAGNOSIS — N2581 Secondary hyperparathyroidism of renal origin: Secondary | ICD-10-CM | POA: Diagnosis not present

## 2018-06-10 DIAGNOSIS — E1029 Type 1 diabetes mellitus with other diabetic kidney complication: Secondary | ICD-10-CM | POA: Diagnosis not present

## 2018-06-10 DIAGNOSIS — D631 Anemia in chronic kidney disease: Secondary | ICD-10-CM | POA: Diagnosis not present

## 2018-06-12 DIAGNOSIS — E1029 Type 1 diabetes mellitus with other diabetic kidney complication: Secondary | ICD-10-CM | POA: Diagnosis not present

## 2018-06-12 DIAGNOSIS — N2581 Secondary hyperparathyroidism of renal origin: Secondary | ICD-10-CM | POA: Diagnosis not present

## 2018-06-12 DIAGNOSIS — D631 Anemia in chronic kidney disease: Secondary | ICD-10-CM | POA: Diagnosis not present

## 2018-06-12 DIAGNOSIS — N186 End stage renal disease: Secondary | ICD-10-CM | POA: Diagnosis not present

## 2018-06-14 DIAGNOSIS — D631 Anemia in chronic kidney disease: Secondary | ICD-10-CM | POA: Diagnosis not present

## 2018-06-14 DIAGNOSIS — E1029 Type 1 diabetes mellitus with other diabetic kidney complication: Secondary | ICD-10-CM | POA: Diagnosis not present

## 2018-06-14 DIAGNOSIS — N2581 Secondary hyperparathyroidism of renal origin: Secondary | ICD-10-CM | POA: Diagnosis not present

## 2018-06-14 DIAGNOSIS — N186 End stage renal disease: Secondary | ICD-10-CM | POA: Diagnosis not present

## 2018-06-16 DIAGNOSIS — N2581 Secondary hyperparathyroidism of renal origin: Secondary | ICD-10-CM | POA: Diagnosis not present

## 2018-06-16 DIAGNOSIS — N186 End stage renal disease: Secondary | ICD-10-CM | POA: Diagnosis not present

## 2018-06-16 DIAGNOSIS — E1029 Type 1 diabetes mellitus with other diabetic kidney complication: Secondary | ICD-10-CM | POA: Diagnosis not present

## 2018-06-16 DIAGNOSIS — D631 Anemia in chronic kidney disease: Secondary | ICD-10-CM | POA: Diagnosis not present

## 2018-06-19 DIAGNOSIS — N186 End stage renal disease: Secondary | ICD-10-CM | POA: Diagnosis not present

## 2018-06-19 DIAGNOSIS — D631 Anemia in chronic kidney disease: Secondary | ICD-10-CM | POA: Diagnosis not present

## 2018-06-19 DIAGNOSIS — E1029 Type 1 diabetes mellitus with other diabetic kidney complication: Secondary | ICD-10-CM | POA: Diagnosis not present

## 2018-06-19 DIAGNOSIS — N2581 Secondary hyperparathyroidism of renal origin: Secondary | ICD-10-CM | POA: Diagnosis not present

## 2018-06-20 DIAGNOSIS — E1029 Type 1 diabetes mellitus with other diabetic kidney complication: Secondary | ICD-10-CM | POA: Diagnosis not present

## 2018-06-20 DIAGNOSIS — N186 End stage renal disease: Secondary | ICD-10-CM | POA: Diagnosis not present

## 2018-06-20 DIAGNOSIS — Z992 Dependence on renal dialysis: Secondary | ICD-10-CM | POA: Diagnosis not present

## 2018-06-22 DIAGNOSIS — N186 End stage renal disease: Secondary | ICD-10-CM | POA: Diagnosis not present

## 2018-06-22 DIAGNOSIS — D631 Anemia in chronic kidney disease: Secondary | ICD-10-CM | POA: Diagnosis not present

## 2018-06-22 DIAGNOSIS — E1029 Type 1 diabetes mellitus with other diabetic kidney complication: Secondary | ICD-10-CM | POA: Diagnosis not present

## 2018-06-22 DIAGNOSIS — D509 Iron deficiency anemia, unspecified: Secondary | ICD-10-CM | POA: Diagnosis not present

## 2018-06-22 DIAGNOSIS — N2581 Secondary hyperparathyroidism of renal origin: Secondary | ICD-10-CM | POA: Diagnosis not present

## 2018-06-24 DIAGNOSIS — D631 Anemia in chronic kidney disease: Secondary | ICD-10-CM | POA: Diagnosis not present

## 2018-06-24 DIAGNOSIS — N186 End stage renal disease: Secondary | ICD-10-CM | POA: Diagnosis not present

## 2018-06-24 DIAGNOSIS — E1029 Type 1 diabetes mellitus with other diabetic kidney complication: Secondary | ICD-10-CM | POA: Diagnosis not present

## 2018-06-24 DIAGNOSIS — N2581 Secondary hyperparathyroidism of renal origin: Secondary | ICD-10-CM | POA: Diagnosis not present

## 2018-06-24 DIAGNOSIS — D509 Iron deficiency anemia, unspecified: Secondary | ICD-10-CM | POA: Diagnosis not present

## 2018-06-26 DIAGNOSIS — D509 Iron deficiency anemia, unspecified: Secondary | ICD-10-CM | POA: Diagnosis not present

## 2018-06-26 DIAGNOSIS — N2581 Secondary hyperparathyroidism of renal origin: Secondary | ICD-10-CM | POA: Diagnosis not present

## 2018-06-26 DIAGNOSIS — N186 End stage renal disease: Secondary | ICD-10-CM | POA: Diagnosis not present

## 2018-06-26 DIAGNOSIS — E1029 Type 1 diabetes mellitus with other diabetic kidney complication: Secondary | ICD-10-CM | POA: Diagnosis not present

## 2018-06-26 DIAGNOSIS — D631 Anemia in chronic kidney disease: Secondary | ICD-10-CM | POA: Diagnosis not present

## 2018-06-28 DIAGNOSIS — I10 Essential (primary) hypertension: Secondary | ICD-10-CM | POA: Diagnosis not present

## 2018-06-28 DIAGNOSIS — E119 Type 2 diabetes mellitus without complications: Secondary | ICD-10-CM | POA: Diagnosis not present

## 2018-06-28 DIAGNOSIS — N182 Chronic kidney disease, stage 2 (mild): Secondary | ICD-10-CM | POA: Diagnosis not present

## 2018-06-28 DIAGNOSIS — E785 Hyperlipidemia, unspecified: Secondary | ICD-10-CM | POA: Diagnosis not present

## 2018-06-29 DIAGNOSIS — E1029 Type 1 diabetes mellitus with other diabetic kidney complication: Secondary | ICD-10-CM | POA: Diagnosis not present

## 2018-06-29 DIAGNOSIS — D631 Anemia in chronic kidney disease: Secondary | ICD-10-CM | POA: Diagnosis not present

## 2018-06-29 DIAGNOSIS — N186 End stage renal disease: Secondary | ICD-10-CM | POA: Diagnosis not present

## 2018-06-29 DIAGNOSIS — D509 Iron deficiency anemia, unspecified: Secondary | ICD-10-CM | POA: Diagnosis not present

## 2018-06-29 DIAGNOSIS — N2581 Secondary hyperparathyroidism of renal origin: Secondary | ICD-10-CM | POA: Diagnosis not present

## 2018-07-01 DIAGNOSIS — D631 Anemia in chronic kidney disease: Secondary | ICD-10-CM | POA: Diagnosis not present

## 2018-07-01 DIAGNOSIS — N186 End stage renal disease: Secondary | ICD-10-CM | POA: Diagnosis not present

## 2018-07-01 DIAGNOSIS — N2581 Secondary hyperparathyroidism of renal origin: Secondary | ICD-10-CM | POA: Diagnosis not present

## 2018-07-01 DIAGNOSIS — D509 Iron deficiency anemia, unspecified: Secondary | ICD-10-CM | POA: Diagnosis not present

## 2018-07-01 DIAGNOSIS — E1029 Type 1 diabetes mellitus with other diabetic kidney complication: Secondary | ICD-10-CM | POA: Diagnosis not present

## 2018-07-03 DIAGNOSIS — D509 Iron deficiency anemia, unspecified: Secondary | ICD-10-CM | POA: Diagnosis not present

## 2018-07-03 DIAGNOSIS — E1029 Type 1 diabetes mellitus with other diabetic kidney complication: Secondary | ICD-10-CM | POA: Diagnosis not present

## 2018-07-03 DIAGNOSIS — N186 End stage renal disease: Secondary | ICD-10-CM | POA: Diagnosis not present

## 2018-07-03 DIAGNOSIS — D631 Anemia in chronic kidney disease: Secondary | ICD-10-CM | POA: Diagnosis not present

## 2018-07-03 DIAGNOSIS — N2581 Secondary hyperparathyroidism of renal origin: Secondary | ICD-10-CM | POA: Diagnosis not present

## 2018-07-06 DIAGNOSIS — D631 Anemia in chronic kidney disease: Secondary | ICD-10-CM | POA: Diagnosis not present

## 2018-07-06 DIAGNOSIS — E1029 Type 1 diabetes mellitus with other diabetic kidney complication: Secondary | ICD-10-CM | POA: Diagnosis not present

## 2018-07-06 DIAGNOSIS — N186 End stage renal disease: Secondary | ICD-10-CM | POA: Diagnosis not present

## 2018-07-06 DIAGNOSIS — D509 Iron deficiency anemia, unspecified: Secondary | ICD-10-CM | POA: Diagnosis not present

## 2018-07-06 DIAGNOSIS — N2581 Secondary hyperparathyroidism of renal origin: Secondary | ICD-10-CM | POA: Diagnosis not present

## 2018-07-08 DIAGNOSIS — N2581 Secondary hyperparathyroidism of renal origin: Secondary | ICD-10-CM | POA: Diagnosis not present

## 2018-07-08 DIAGNOSIS — N186 End stage renal disease: Secondary | ICD-10-CM | POA: Diagnosis not present

## 2018-07-08 DIAGNOSIS — E1029 Type 1 diabetes mellitus with other diabetic kidney complication: Secondary | ICD-10-CM | POA: Diagnosis not present

## 2018-07-08 DIAGNOSIS — D509 Iron deficiency anemia, unspecified: Secondary | ICD-10-CM | POA: Diagnosis not present

## 2018-07-08 DIAGNOSIS — D631 Anemia in chronic kidney disease: Secondary | ICD-10-CM | POA: Diagnosis not present

## 2018-07-10 DIAGNOSIS — N2581 Secondary hyperparathyroidism of renal origin: Secondary | ICD-10-CM | POA: Diagnosis not present

## 2018-07-10 DIAGNOSIS — E1029 Type 1 diabetes mellitus with other diabetic kidney complication: Secondary | ICD-10-CM | POA: Diagnosis not present

## 2018-07-10 DIAGNOSIS — N186 End stage renal disease: Secondary | ICD-10-CM | POA: Diagnosis not present

## 2018-07-10 DIAGNOSIS — D631 Anemia in chronic kidney disease: Secondary | ICD-10-CM | POA: Diagnosis not present

## 2018-07-10 DIAGNOSIS — D509 Iron deficiency anemia, unspecified: Secondary | ICD-10-CM | POA: Diagnosis not present

## 2018-07-12 DIAGNOSIS — N2581 Secondary hyperparathyroidism of renal origin: Secondary | ICD-10-CM | POA: Diagnosis not present

## 2018-07-12 DIAGNOSIS — D631 Anemia in chronic kidney disease: Secondary | ICD-10-CM | POA: Diagnosis not present

## 2018-07-12 DIAGNOSIS — N186 End stage renal disease: Secondary | ICD-10-CM | POA: Diagnosis not present

## 2018-07-12 DIAGNOSIS — D509 Iron deficiency anemia, unspecified: Secondary | ICD-10-CM | POA: Diagnosis not present

## 2018-07-12 DIAGNOSIS — E1029 Type 1 diabetes mellitus with other diabetic kidney complication: Secondary | ICD-10-CM | POA: Diagnosis not present

## 2018-07-15 DIAGNOSIS — D509 Iron deficiency anemia, unspecified: Secondary | ICD-10-CM | POA: Diagnosis not present

## 2018-07-15 DIAGNOSIS — N186 End stage renal disease: Secondary | ICD-10-CM | POA: Diagnosis not present

## 2018-07-15 DIAGNOSIS — E1029 Type 1 diabetes mellitus with other diabetic kidney complication: Secondary | ICD-10-CM | POA: Diagnosis not present

## 2018-07-15 DIAGNOSIS — N2581 Secondary hyperparathyroidism of renal origin: Secondary | ICD-10-CM | POA: Diagnosis not present

## 2018-07-15 DIAGNOSIS — D631 Anemia in chronic kidney disease: Secondary | ICD-10-CM | POA: Diagnosis not present

## 2018-07-17 DIAGNOSIS — D631 Anemia in chronic kidney disease: Secondary | ICD-10-CM | POA: Diagnosis not present

## 2018-07-17 DIAGNOSIS — E1029 Type 1 diabetes mellitus with other diabetic kidney complication: Secondary | ICD-10-CM | POA: Diagnosis not present

## 2018-07-17 DIAGNOSIS — N186 End stage renal disease: Secondary | ICD-10-CM | POA: Diagnosis not present

## 2018-07-17 DIAGNOSIS — D509 Iron deficiency anemia, unspecified: Secondary | ICD-10-CM | POA: Diagnosis not present

## 2018-07-17 DIAGNOSIS — N2581 Secondary hyperparathyroidism of renal origin: Secondary | ICD-10-CM | POA: Diagnosis not present

## 2018-07-19 DIAGNOSIS — E1029 Type 1 diabetes mellitus with other diabetic kidney complication: Secondary | ICD-10-CM | POA: Diagnosis not present

## 2018-07-19 DIAGNOSIS — N2581 Secondary hyperparathyroidism of renal origin: Secondary | ICD-10-CM | POA: Diagnosis not present

## 2018-07-19 DIAGNOSIS — D631 Anemia in chronic kidney disease: Secondary | ICD-10-CM | POA: Diagnosis not present

## 2018-07-19 DIAGNOSIS — D509 Iron deficiency anemia, unspecified: Secondary | ICD-10-CM | POA: Diagnosis not present

## 2018-07-19 DIAGNOSIS — N186 End stage renal disease: Secondary | ICD-10-CM | POA: Diagnosis not present

## 2018-07-21 DIAGNOSIS — N186 End stage renal disease: Secondary | ICD-10-CM | POA: Diagnosis not present

## 2018-07-21 DIAGNOSIS — E1029 Type 1 diabetes mellitus with other diabetic kidney complication: Secondary | ICD-10-CM | POA: Diagnosis not present

## 2018-07-21 DIAGNOSIS — Z992 Dependence on renal dialysis: Secondary | ICD-10-CM | POA: Diagnosis not present

## 2018-07-22 DIAGNOSIS — D631 Anemia in chronic kidney disease: Secondary | ICD-10-CM | POA: Diagnosis not present

## 2018-07-22 DIAGNOSIS — D509 Iron deficiency anemia, unspecified: Secondary | ICD-10-CM | POA: Diagnosis not present

## 2018-07-22 DIAGNOSIS — N186 End stage renal disease: Secondary | ICD-10-CM | POA: Diagnosis not present

## 2018-07-22 DIAGNOSIS — N2581 Secondary hyperparathyroidism of renal origin: Secondary | ICD-10-CM | POA: Diagnosis not present

## 2018-07-22 DIAGNOSIS — E1029 Type 1 diabetes mellitus with other diabetic kidney complication: Secondary | ICD-10-CM | POA: Diagnosis not present

## 2018-07-24 DIAGNOSIS — D509 Iron deficiency anemia, unspecified: Secondary | ICD-10-CM | POA: Diagnosis not present

## 2018-07-24 DIAGNOSIS — N2581 Secondary hyperparathyroidism of renal origin: Secondary | ICD-10-CM | POA: Diagnosis not present

## 2018-07-24 DIAGNOSIS — D631 Anemia in chronic kidney disease: Secondary | ICD-10-CM | POA: Diagnosis not present

## 2018-07-24 DIAGNOSIS — E1029 Type 1 diabetes mellitus with other diabetic kidney complication: Secondary | ICD-10-CM | POA: Diagnosis not present

## 2018-07-24 DIAGNOSIS — N186 End stage renal disease: Secondary | ICD-10-CM | POA: Diagnosis not present

## 2018-07-27 DIAGNOSIS — D631 Anemia in chronic kidney disease: Secondary | ICD-10-CM | POA: Diagnosis not present

## 2018-07-27 DIAGNOSIS — D509 Iron deficiency anemia, unspecified: Secondary | ICD-10-CM | POA: Diagnosis not present

## 2018-07-27 DIAGNOSIS — N2581 Secondary hyperparathyroidism of renal origin: Secondary | ICD-10-CM | POA: Diagnosis not present

## 2018-07-27 DIAGNOSIS — N186 End stage renal disease: Secondary | ICD-10-CM | POA: Diagnosis not present

## 2018-07-27 DIAGNOSIS — E1029 Type 1 diabetes mellitus with other diabetic kidney complication: Secondary | ICD-10-CM | POA: Diagnosis not present

## 2018-07-29 DIAGNOSIS — D631 Anemia in chronic kidney disease: Secondary | ICD-10-CM | POA: Diagnosis not present

## 2018-07-29 DIAGNOSIS — N186 End stage renal disease: Secondary | ICD-10-CM | POA: Diagnosis not present

## 2018-07-29 DIAGNOSIS — D509 Iron deficiency anemia, unspecified: Secondary | ICD-10-CM | POA: Diagnosis not present

## 2018-07-29 DIAGNOSIS — N2581 Secondary hyperparathyroidism of renal origin: Secondary | ICD-10-CM | POA: Diagnosis not present

## 2018-07-29 DIAGNOSIS — E1029 Type 1 diabetes mellitus with other diabetic kidney complication: Secondary | ICD-10-CM | POA: Diagnosis not present

## 2018-07-31 DIAGNOSIS — D509 Iron deficiency anemia, unspecified: Secondary | ICD-10-CM | POA: Diagnosis not present

## 2018-07-31 DIAGNOSIS — D631 Anemia in chronic kidney disease: Secondary | ICD-10-CM | POA: Diagnosis not present

## 2018-07-31 DIAGNOSIS — E1029 Type 1 diabetes mellitus with other diabetic kidney complication: Secondary | ICD-10-CM | POA: Diagnosis not present

## 2018-07-31 DIAGNOSIS — N186 End stage renal disease: Secondary | ICD-10-CM | POA: Diagnosis not present

## 2018-07-31 DIAGNOSIS — N2581 Secondary hyperparathyroidism of renal origin: Secondary | ICD-10-CM | POA: Diagnosis not present

## 2018-08-03 DIAGNOSIS — D631 Anemia in chronic kidney disease: Secondary | ICD-10-CM | POA: Diagnosis not present

## 2018-08-03 DIAGNOSIS — E1029 Type 1 diabetes mellitus with other diabetic kidney complication: Secondary | ICD-10-CM | POA: Diagnosis not present

## 2018-08-03 DIAGNOSIS — N2581 Secondary hyperparathyroidism of renal origin: Secondary | ICD-10-CM | POA: Diagnosis not present

## 2018-08-03 DIAGNOSIS — D509 Iron deficiency anemia, unspecified: Secondary | ICD-10-CM | POA: Diagnosis not present

## 2018-08-03 DIAGNOSIS — N186 End stage renal disease: Secondary | ICD-10-CM | POA: Diagnosis not present

## 2018-08-05 DIAGNOSIS — N2581 Secondary hyperparathyroidism of renal origin: Secondary | ICD-10-CM | POA: Diagnosis not present

## 2018-08-05 DIAGNOSIS — E1029 Type 1 diabetes mellitus with other diabetic kidney complication: Secondary | ICD-10-CM | POA: Diagnosis not present

## 2018-08-05 DIAGNOSIS — D509 Iron deficiency anemia, unspecified: Secondary | ICD-10-CM | POA: Diagnosis not present

## 2018-08-05 DIAGNOSIS — D631 Anemia in chronic kidney disease: Secondary | ICD-10-CM | POA: Diagnosis not present

## 2018-08-05 DIAGNOSIS — N186 End stage renal disease: Secondary | ICD-10-CM | POA: Diagnosis not present

## 2018-08-07 DIAGNOSIS — N2581 Secondary hyperparathyroidism of renal origin: Secondary | ICD-10-CM | POA: Diagnosis not present

## 2018-08-07 DIAGNOSIS — D509 Iron deficiency anemia, unspecified: Secondary | ICD-10-CM | POA: Diagnosis not present

## 2018-08-07 DIAGNOSIS — E1029 Type 1 diabetes mellitus with other diabetic kidney complication: Secondary | ICD-10-CM | POA: Diagnosis not present

## 2018-08-07 DIAGNOSIS — D631 Anemia in chronic kidney disease: Secondary | ICD-10-CM | POA: Diagnosis not present

## 2018-08-07 DIAGNOSIS — N186 End stage renal disease: Secondary | ICD-10-CM | POA: Diagnosis not present

## 2018-08-10 DIAGNOSIS — N2581 Secondary hyperparathyroidism of renal origin: Secondary | ICD-10-CM | POA: Diagnosis not present

## 2018-08-10 DIAGNOSIS — E1029 Type 1 diabetes mellitus with other diabetic kidney complication: Secondary | ICD-10-CM | POA: Diagnosis not present

## 2018-08-10 DIAGNOSIS — N186 End stage renal disease: Secondary | ICD-10-CM | POA: Diagnosis not present

## 2018-08-10 DIAGNOSIS — D631 Anemia in chronic kidney disease: Secondary | ICD-10-CM | POA: Diagnosis not present

## 2018-08-10 DIAGNOSIS — D509 Iron deficiency anemia, unspecified: Secondary | ICD-10-CM | POA: Diagnosis not present

## 2018-08-12 DIAGNOSIS — D509 Iron deficiency anemia, unspecified: Secondary | ICD-10-CM | POA: Diagnosis not present

## 2018-08-12 DIAGNOSIS — D631 Anemia in chronic kidney disease: Secondary | ICD-10-CM | POA: Diagnosis not present

## 2018-08-12 DIAGNOSIS — E1029 Type 1 diabetes mellitus with other diabetic kidney complication: Secondary | ICD-10-CM | POA: Diagnosis not present

## 2018-08-12 DIAGNOSIS — N186 End stage renal disease: Secondary | ICD-10-CM | POA: Diagnosis not present

## 2018-08-12 DIAGNOSIS — N2581 Secondary hyperparathyroidism of renal origin: Secondary | ICD-10-CM | POA: Diagnosis not present

## 2018-08-14 DIAGNOSIS — N2581 Secondary hyperparathyroidism of renal origin: Secondary | ICD-10-CM | POA: Diagnosis not present

## 2018-08-14 DIAGNOSIS — N186 End stage renal disease: Secondary | ICD-10-CM | POA: Diagnosis not present

## 2018-08-14 DIAGNOSIS — D631 Anemia in chronic kidney disease: Secondary | ICD-10-CM | POA: Diagnosis not present

## 2018-08-14 DIAGNOSIS — D509 Iron deficiency anemia, unspecified: Secondary | ICD-10-CM | POA: Diagnosis not present

## 2018-08-14 DIAGNOSIS — E1029 Type 1 diabetes mellitus with other diabetic kidney complication: Secondary | ICD-10-CM | POA: Diagnosis not present

## 2018-08-17 DIAGNOSIS — E1029 Type 1 diabetes mellitus with other diabetic kidney complication: Secondary | ICD-10-CM | POA: Diagnosis not present

## 2018-08-17 DIAGNOSIS — N186 End stage renal disease: Secondary | ICD-10-CM | POA: Diagnosis not present

## 2018-08-17 DIAGNOSIS — D631 Anemia in chronic kidney disease: Secondary | ICD-10-CM | POA: Diagnosis not present

## 2018-08-17 DIAGNOSIS — N2581 Secondary hyperparathyroidism of renal origin: Secondary | ICD-10-CM | POA: Diagnosis not present

## 2018-08-17 DIAGNOSIS — D509 Iron deficiency anemia, unspecified: Secondary | ICD-10-CM | POA: Diagnosis not present

## 2018-08-19 DIAGNOSIS — N186 End stage renal disease: Secondary | ICD-10-CM | POA: Diagnosis not present

## 2018-08-19 DIAGNOSIS — N2581 Secondary hyperparathyroidism of renal origin: Secondary | ICD-10-CM | POA: Diagnosis not present

## 2018-08-19 DIAGNOSIS — E1029 Type 1 diabetes mellitus with other diabetic kidney complication: Secondary | ICD-10-CM | POA: Diagnosis not present

## 2018-08-19 DIAGNOSIS — D631 Anemia in chronic kidney disease: Secondary | ICD-10-CM | POA: Diagnosis not present

## 2018-08-19 DIAGNOSIS — D509 Iron deficiency anemia, unspecified: Secondary | ICD-10-CM | POA: Diagnosis not present

## 2018-08-21 DIAGNOSIS — N186 End stage renal disease: Secondary | ICD-10-CM | POA: Diagnosis not present

## 2018-08-21 DIAGNOSIS — D631 Anemia in chronic kidney disease: Secondary | ICD-10-CM | POA: Diagnosis not present

## 2018-08-21 DIAGNOSIS — E1029 Type 1 diabetes mellitus with other diabetic kidney complication: Secondary | ICD-10-CM | POA: Diagnosis not present

## 2018-08-21 DIAGNOSIS — D509 Iron deficiency anemia, unspecified: Secondary | ICD-10-CM | POA: Diagnosis not present

## 2018-08-21 DIAGNOSIS — N2581 Secondary hyperparathyroidism of renal origin: Secondary | ICD-10-CM | POA: Diagnosis not present

## 2018-08-21 DIAGNOSIS — Z992 Dependence on renal dialysis: Secondary | ICD-10-CM | POA: Diagnosis not present

## 2018-08-24 DIAGNOSIS — D509 Iron deficiency anemia, unspecified: Secondary | ICD-10-CM | POA: Diagnosis not present

## 2018-08-24 DIAGNOSIS — N2581 Secondary hyperparathyroidism of renal origin: Secondary | ICD-10-CM | POA: Diagnosis not present

## 2018-08-24 DIAGNOSIS — N186 End stage renal disease: Secondary | ICD-10-CM | POA: Diagnosis not present

## 2018-08-24 DIAGNOSIS — D631 Anemia in chronic kidney disease: Secondary | ICD-10-CM | POA: Diagnosis not present

## 2018-08-24 DIAGNOSIS — E1029 Type 1 diabetes mellitus with other diabetic kidney complication: Secondary | ICD-10-CM | POA: Diagnosis not present

## 2018-08-25 DIAGNOSIS — E1065 Type 1 diabetes mellitus with hyperglycemia: Secondary | ICD-10-CM | POA: Diagnosis not present

## 2018-08-25 DIAGNOSIS — N186 End stage renal disease: Secondary | ICD-10-CM | POA: Diagnosis not present

## 2018-08-25 DIAGNOSIS — E103593 Type 1 diabetes mellitus with proliferative diabetic retinopathy without macular edema, bilateral: Secondary | ICD-10-CM | POA: Diagnosis not present

## 2018-08-25 DIAGNOSIS — E1022 Type 1 diabetes mellitus with diabetic chronic kidney disease: Secondary | ICD-10-CM | POA: Diagnosis not present

## 2018-08-25 DIAGNOSIS — Z992 Dependence on renal dialysis: Secondary | ICD-10-CM | POA: Diagnosis not present

## 2018-08-25 DIAGNOSIS — Z794 Long term (current) use of insulin: Secondary | ICD-10-CM | POA: Diagnosis not present

## 2018-08-26 DIAGNOSIS — E1029 Type 1 diabetes mellitus with other diabetic kidney complication: Secondary | ICD-10-CM | POA: Diagnosis not present

## 2018-08-26 DIAGNOSIS — D509 Iron deficiency anemia, unspecified: Secondary | ICD-10-CM | POA: Diagnosis not present

## 2018-08-26 DIAGNOSIS — N186 End stage renal disease: Secondary | ICD-10-CM | POA: Diagnosis not present

## 2018-08-26 DIAGNOSIS — N2581 Secondary hyperparathyroidism of renal origin: Secondary | ICD-10-CM | POA: Diagnosis not present

## 2018-08-26 DIAGNOSIS — D631 Anemia in chronic kidney disease: Secondary | ICD-10-CM | POA: Diagnosis not present

## 2018-08-28 DIAGNOSIS — D631 Anemia in chronic kidney disease: Secondary | ICD-10-CM | POA: Diagnosis not present

## 2018-08-28 DIAGNOSIS — N186 End stage renal disease: Secondary | ICD-10-CM | POA: Diagnosis not present

## 2018-08-28 DIAGNOSIS — E1029 Type 1 diabetes mellitus with other diabetic kidney complication: Secondary | ICD-10-CM | POA: Diagnosis not present

## 2018-08-28 DIAGNOSIS — D509 Iron deficiency anemia, unspecified: Secondary | ICD-10-CM | POA: Diagnosis not present

## 2018-08-28 DIAGNOSIS — N2581 Secondary hyperparathyroidism of renal origin: Secondary | ICD-10-CM | POA: Diagnosis not present

## 2018-08-31 DIAGNOSIS — E1029 Type 1 diabetes mellitus with other diabetic kidney complication: Secondary | ICD-10-CM | POA: Diagnosis not present

## 2018-08-31 DIAGNOSIS — D631 Anemia in chronic kidney disease: Secondary | ICD-10-CM | POA: Diagnosis not present

## 2018-08-31 DIAGNOSIS — N2581 Secondary hyperparathyroidism of renal origin: Secondary | ICD-10-CM | POA: Diagnosis not present

## 2018-08-31 DIAGNOSIS — D509 Iron deficiency anemia, unspecified: Secondary | ICD-10-CM | POA: Diagnosis not present

## 2018-08-31 DIAGNOSIS — N186 End stage renal disease: Secondary | ICD-10-CM | POA: Diagnosis not present

## 2018-09-02 DIAGNOSIS — E1029 Type 1 diabetes mellitus with other diabetic kidney complication: Secondary | ICD-10-CM | POA: Diagnosis not present

## 2018-09-02 DIAGNOSIS — D509 Iron deficiency anemia, unspecified: Secondary | ICD-10-CM | POA: Diagnosis not present

## 2018-09-02 DIAGNOSIS — D631 Anemia in chronic kidney disease: Secondary | ICD-10-CM | POA: Diagnosis not present

## 2018-09-02 DIAGNOSIS — N2581 Secondary hyperparathyroidism of renal origin: Secondary | ICD-10-CM | POA: Diagnosis not present

## 2018-09-02 DIAGNOSIS — N186 End stage renal disease: Secondary | ICD-10-CM | POA: Diagnosis not present

## 2018-09-03 DIAGNOSIS — N2581 Secondary hyperparathyroidism of renal origin: Secondary | ICD-10-CM | POA: Diagnosis not present

## 2018-09-03 DIAGNOSIS — D631 Anemia in chronic kidney disease: Secondary | ICD-10-CM | POA: Diagnosis not present

## 2018-09-03 DIAGNOSIS — D509 Iron deficiency anemia, unspecified: Secondary | ICD-10-CM | POA: Diagnosis not present

## 2018-09-03 DIAGNOSIS — E1029 Type 1 diabetes mellitus with other diabetic kidney complication: Secondary | ICD-10-CM | POA: Diagnosis not present

## 2018-09-03 DIAGNOSIS — N186 End stage renal disease: Secondary | ICD-10-CM | POA: Diagnosis not present

## 2018-09-07 DIAGNOSIS — D509 Iron deficiency anemia, unspecified: Secondary | ICD-10-CM | POA: Diagnosis not present

## 2018-09-07 DIAGNOSIS — D631 Anemia in chronic kidney disease: Secondary | ICD-10-CM | POA: Diagnosis not present

## 2018-09-07 DIAGNOSIS — N186 End stage renal disease: Secondary | ICD-10-CM | POA: Diagnosis not present

## 2018-09-07 DIAGNOSIS — E1029 Type 1 diabetes mellitus with other diabetic kidney complication: Secondary | ICD-10-CM | POA: Diagnosis not present

## 2018-09-07 DIAGNOSIS — N2581 Secondary hyperparathyroidism of renal origin: Secondary | ICD-10-CM | POA: Diagnosis not present

## 2018-09-09 DIAGNOSIS — E1029 Type 1 diabetes mellitus with other diabetic kidney complication: Secondary | ICD-10-CM | POA: Diagnosis not present

## 2018-09-09 DIAGNOSIS — D509 Iron deficiency anemia, unspecified: Secondary | ICD-10-CM | POA: Diagnosis not present

## 2018-09-09 DIAGNOSIS — N2581 Secondary hyperparathyroidism of renal origin: Secondary | ICD-10-CM | POA: Diagnosis not present

## 2018-09-09 DIAGNOSIS — D631 Anemia in chronic kidney disease: Secondary | ICD-10-CM | POA: Diagnosis not present

## 2018-09-09 DIAGNOSIS — N186 End stage renal disease: Secondary | ICD-10-CM | POA: Diagnosis not present

## 2018-09-11 DIAGNOSIS — E1029 Type 1 diabetes mellitus with other diabetic kidney complication: Secondary | ICD-10-CM | POA: Diagnosis not present

## 2018-09-11 DIAGNOSIS — D509 Iron deficiency anemia, unspecified: Secondary | ICD-10-CM | POA: Diagnosis not present

## 2018-09-11 DIAGNOSIS — N2581 Secondary hyperparathyroidism of renal origin: Secondary | ICD-10-CM | POA: Diagnosis not present

## 2018-09-11 DIAGNOSIS — N186 End stage renal disease: Secondary | ICD-10-CM | POA: Diagnosis not present

## 2018-09-11 DIAGNOSIS — D631 Anemia in chronic kidney disease: Secondary | ICD-10-CM | POA: Diagnosis not present

## 2018-09-13 DIAGNOSIS — M89372 Hypertrophy of bone, left ankle and foot: Secondary | ICD-10-CM | POA: Diagnosis not present

## 2018-09-13 DIAGNOSIS — I119 Hypertensive heart disease without heart failure: Secondary | ICD-10-CM | POA: Diagnosis not present

## 2018-09-13 DIAGNOSIS — E1165 Type 2 diabetes mellitus with hyperglycemia: Secondary | ICD-10-CM | POA: Diagnosis not present

## 2018-09-13 DIAGNOSIS — E538 Deficiency of other specified B group vitamins: Secondary | ICD-10-CM | POA: Diagnosis not present

## 2018-09-13 DIAGNOSIS — Z992 Dependence on renal dialysis: Secondary | ICD-10-CM | POA: Diagnosis not present

## 2018-09-13 DIAGNOSIS — D638 Anemia in other chronic diseases classified elsewhere: Secondary | ICD-10-CM | POA: Diagnosis not present

## 2018-09-13 DIAGNOSIS — E782 Mixed hyperlipidemia: Secondary | ICD-10-CM | POA: Diagnosis not present

## 2018-09-13 DIAGNOSIS — M89371 Hypertrophy of bone, right ankle and foot: Secondary | ICD-10-CM | POA: Diagnosis not present

## 2018-09-13 DIAGNOSIS — N186 End stage renal disease: Secondary | ICD-10-CM | POA: Diagnosis not present

## 2018-09-13 DIAGNOSIS — L84 Corns and callosities: Secondary | ICD-10-CM | POA: Diagnosis not present

## 2018-09-13 DIAGNOSIS — E119 Type 2 diabetes mellitus without complications: Secondary | ICD-10-CM | POA: Diagnosis not present

## 2018-09-13 DIAGNOSIS — I10 Essential (primary) hypertension: Secondary | ICD-10-CM | POA: Diagnosis not present

## 2018-09-14 DIAGNOSIS — D509 Iron deficiency anemia, unspecified: Secondary | ICD-10-CM | POA: Diagnosis not present

## 2018-09-14 DIAGNOSIS — D631 Anemia in chronic kidney disease: Secondary | ICD-10-CM | POA: Diagnosis not present

## 2018-09-14 DIAGNOSIS — E1029 Type 1 diabetes mellitus with other diabetic kidney complication: Secondary | ICD-10-CM | POA: Diagnosis not present

## 2018-09-14 DIAGNOSIS — N186 End stage renal disease: Secondary | ICD-10-CM | POA: Diagnosis not present

## 2018-09-14 DIAGNOSIS — N2581 Secondary hyperparathyroidism of renal origin: Secondary | ICD-10-CM | POA: Diagnosis not present

## 2018-09-16 DIAGNOSIS — N186 End stage renal disease: Secondary | ICD-10-CM | POA: Diagnosis not present

## 2018-09-16 DIAGNOSIS — N2581 Secondary hyperparathyroidism of renal origin: Secondary | ICD-10-CM | POA: Diagnosis not present

## 2018-09-16 DIAGNOSIS — D509 Iron deficiency anemia, unspecified: Secondary | ICD-10-CM | POA: Diagnosis not present

## 2018-09-16 DIAGNOSIS — D631 Anemia in chronic kidney disease: Secondary | ICD-10-CM | POA: Diagnosis not present

## 2018-09-16 DIAGNOSIS — E1029 Type 1 diabetes mellitus with other diabetic kidney complication: Secondary | ICD-10-CM | POA: Diagnosis not present

## 2018-09-18 DIAGNOSIS — N2581 Secondary hyperparathyroidism of renal origin: Secondary | ICD-10-CM | POA: Diagnosis not present

## 2018-09-18 DIAGNOSIS — D631 Anemia in chronic kidney disease: Secondary | ICD-10-CM | POA: Diagnosis not present

## 2018-09-18 DIAGNOSIS — E1029 Type 1 diabetes mellitus with other diabetic kidney complication: Secondary | ICD-10-CM | POA: Diagnosis not present

## 2018-09-18 DIAGNOSIS — D509 Iron deficiency anemia, unspecified: Secondary | ICD-10-CM | POA: Diagnosis not present

## 2018-09-18 DIAGNOSIS — N186 End stage renal disease: Secondary | ICD-10-CM | POA: Diagnosis not present

## 2018-09-19 DIAGNOSIS — N186 End stage renal disease: Secondary | ICD-10-CM | POA: Diagnosis not present

## 2018-09-19 DIAGNOSIS — E1029 Type 1 diabetes mellitus with other diabetic kidney complication: Secondary | ICD-10-CM | POA: Diagnosis not present

## 2018-09-19 DIAGNOSIS — Z992 Dependence on renal dialysis: Secondary | ICD-10-CM | POA: Diagnosis not present

## 2018-09-21 DIAGNOSIS — N186 End stage renal disease: Secondary | ICD-10-CM | POA: Diagnosis not present

## 2018-09-21 DIAGNOSIS — N2581 Secondary hyperparathyroidism of renal origin: Secondary | ICD-10-CM | POA: Diagnosis not present

## 2018-09-21 DIAGNOSIS — D631 Anemia in chronic kidney disease: Secondary | ICD-10-CM | POA: Diagnosis not present

## 2018-09-21 DIAGNOSIS — E1029 Type 1 diabetes mellitus with other diabetic kidney complication: Secondary | ICD-10-CM | POA: Diagnosis not present

## 2018-09-21 DIAGNOSIS — D509 Iron deficiency anemia, unspecified: Secondary | ICD-10-CM | POA: Diagnosis not present

## 2018-09-23 DIAGNOSIS — E1029 Type 1 diabetes mellitus with other diabetic kidney complication: Secondary | ICD-10-CM | POA: Diagnosis not present

## 2018-09-23 DIAGNOSIS — D631 Anemia in chronic kidney disease: Secondary | ICD-10-CM | POA: Diagnosis not present

## 2018-09-23 DIAGNOSIS — D509 Iron deficiency anemia, unspecified: Secondary | ICD-10-CM | POA: Diagnosis not present

## 2018-09-23 DIAGNOSIS — N2581 Secondary hyperparathyroidism of renal origin: Secondary | ICD-10-CM | POA: Diagnosis not present

## 2018-09-23 DIAGNOSIS — N186 End stage renal disease: Secondary | ICD-10-CM | POA: Diagnosis not present

## 2018-09-25 DIAGNOSIS — E1029 Type 1 diabetes mellitus with other diabetic kidney complication: Secondary | ICD-10-CM | POA: Diagnosis not present

## 2018-09-25 DIAGNOSIS — D509 Iron deficiency anemia, unspecified: Secondary | ICD-10-CM | POA: Diagnosis not present

## 2018-09-25 DIAGNOSIS — N2581 Secondary hyperparathyroidism of renal origin: Secondary | ICD-10-CM | POA: Diagnosis not present

## 2018-09-25 DIAGNOSIS — N186 End stage renal disease: Secondary | ICD-10-CM | POA: Diagnosis not present

## 2018-09-25 DIAGNOSIS — D631 Anemia in chronic kidney disease: Secondary | ICD-10-CM | POA: Diagnosis not present

## 2018-09-28 DIAGNOSIS — E1029 Type 1 diabetes mellitus with other diabetic kidney complication: Secondary | ICD-10-CM | POA: Diagnosis not present

## 2018-09-28 DIAGNOSIS — D509 Iron deficiency anemia, unspecified: Secondary | ICD-10-CM | POA: Diagnosis not present

## 2018-09-28 DIAGNOSIS — N186 End stage renal disease: Secondary | ICD-10-CM | POA: Diagnosis not present

## 2018-09-28 DIAGNOSIS — N2581 Secondary hyperparathyroidism of renal origin: Secondary | ICD-10-CM | POA: Diagnosis not present

## 2018-09-28 DIAGNOSIS — D631 Anemia in chronic kidney disease: Secondary | ICD-10-CM | POA: Diagnosis not present

## 2018-09-30 DIAGNOSIS — D509 Iron deficiency anemia, unspecified: Secondary | ICD-10-CM | POA: Diagnosis not present

## 2018-09-30 DIAGNOSIS — N2581 Secondary hyperparathyroidism of renal origin: Secondary | ICD-10-CM | POA: Diagnosis not present

## 2018-09-30 DIAGNOSIS — E1029 Type 1 diabetes mellitus with other diabetic kidney complication: Secondary | ICD-10-CM | POA: Diagnosis not present

## 2018-09-30 DIAGNOSIS — N186 End stage renal disease: Secondary | ICD-10-CM | POA: Diagnosis not present

## 2018-09-30 DIAGNOSIS — D631 Anemia in chronic kidney disease: Secondary | ICD-10-CM | POA: Diagnosis not present

## 2018-10-02 DIAGNOSIS — N186 End stage renal disease: Secondary | ICD-10-CM | POA: Diagnosis not present

## 2018-10-02 DIAGNOSIS — D631 Anemia in chronic kidney disease: Secondary | ICD-10-CM | POA: Diagnosis not present

## 2018-10-02 DIAGNOSIS — E1029 Type 1 diabetes mellitus with other diabetic kidney complication: Secondary | ICD-10-CM | POA: Diagnosis not present

## 2018-10-02 DIAGNOSIS — N2581 Secondary hyperparathyroidism of renal origin: Secondary | ICD-10-CM | POA: Diagnosis not present

## 2018-10-02 DIAGNOSIS — D509 Iron deficiency anemia, unspecified: Secondary | ICD-10-CM | POA: Diagnosis not present

## 2018-10-05 DIAGNOSIS — N2581 Secondary hyperparathyroidism of renal origin: Secondary | ICD-10-CM | POA: Diagnosis not present

## 2018-10-05 DIAGNOSIS — N186 End stage renal disease: Secondary | ICD-10-CM | POA: Diagnosis not present

## 2018-10-05 DIAGNOSIS — E1029 Type 1 diabetes mellitus with other diabetic kidney complication: Secondary | ICD-10-CM | POA: Diagnosis not present

## 2018-10-05 DIAGNOSIS — D509 Iron deficiency anemia, unspecified: Secondary | ICD-10-CM | POA: Diagnosis not present

## 2018-10-05 DIAGNOSIS — D631 Anemia in chronic kidney disease: Secondary | ICD-10-CM | POA: Diagnosis not present

## 2018-10-07 DIAGNOSIS — N186 End stage renal disease: Secondary | ICD-10-CM | POA: Diagnosis not present

## 2018-10-07 DIAGNOSIS — N2581 Secondary hyperparathyroidism of renal origin: Secondary | ICD-10-CM | POA: Diagnosis not present

## 2018-10-07 DIAGNOSIS — E1029 Type 1 diabetes mellitus with other diabetic kidney complication: Secondary | ICD-10-CM | POA: Diagnosis not present

## 2018-10-07 DIAGNOSIS — D509 Iron deficiency anemia, unspecified: Secondary | ICD-10-CM | POA: Diagnosis not present

## 2018-10-07 DIAGNOSIS — D631 Anemia in chronic kidney disease: Secondary | ICD-10-CM | POA: Diagnosis not present

## 2018-10-09 DIAGNOSIS — N186 End stage renal disease: Secondary | ICD-10-CM | POA: Diagnosis not present

## 2018-10-09 DIAGNOSIS — N2581 Secondary hyperparathyroidism of renal origin: Secondary | ICD-10-CM | POA: Diagnosis not present

## 2018-10-09 DIAGNOSIS — E1029 Type 1 diabetes mellitus with other diabetic kidney complication: Secondary | ICD-10-CM | POA: Diagnosis not present

## 2018-10-09 DIAGNOSIS — D509 Iron deficiency anemia, unspecified: Secondary | ICD-10-CM | POA: Diagnosis not present

## 2018-10-09 DIAGNOSIS — D631 Anemia in chronic kidney disease: Secondary | ICD-10-CM | POA: Diagnosis not present

## 2018-10-12 DIAGNOSIS — D631 Anemia in chronic kidney disease: Secondary | ICD-10-CM | POA: Diagnosis not present

## 2018-10-12 DIAGNOSIS — E1029 Type 1 diabetes mellitus with other diabetic kidney complication: Secondary | ICD-10-CM | POA: Diagnosis not present

## 2018-10-12 DIAGNOSIS — D509 Iron deficiency anemia, unspecified: Secondary | ICD-10-CM | POA: Diagnosis not present

## 2018-10-12 DIAGNOSIS — N186 End stage renal disease: Secondary | ICD-10-CM | POA: Diagnosis not present

## 2018-10-12 DIAGNOSIS — N2581 Secondary hyperparathyroidism of renal origin: Secondary | ICD-10-CM | POA: Diagnosis not present

## 2018-10-13 DIAGNOSIS — E1022 Type 1 diabetes mellitus with diabetic chronic kidney disease: Secondary | ICD-10-CM | POA: Diagnosis not present

## 2018-10-13 DIAGNOSIS — N186 End stage renal disease: Secondary | ICD-10-CM | POA: Diagnosis not present

## 2018-10-13 DIAGNOSIS — Z992 Dependence on renal dialysis: Secondary | ICD-10-CM | POA: Diagnosis not present

## 2018-10-14 DIAGNOSIS — D509 Iron deficiency anemia, unspecified: Secondary | ICD-10-CM | POA: Diagnosis not present

## 2018-10-14 DIAGNOSIS — E1029 Type 1 diabetes mellitus with other diabetic kidney complication: Secondary | ICD-10-CM | POA: Diagnosis not present

## 2018-10-14 DIAGNOSIS — N2581 Secondary hyperparathyroidism of renal origin: Secondary | ICD-10-CM | POA: Diagnosis not present

## 2018-10-14 DIAGNOSIS — D631 Anemia in chronic kidney disease: Secondary | ICD-10-CM | POA: Diagnosis not present

## 2018-10-14 DIAGNOSIS — N186 End stage renal disease: Secondary | ICD-10-CM | POA: Diagnosis not present

## 2018-10-16 DIAGNOSIS — N186 End stage renal disease: Secondary | ICD-10-CM | POA: Diagnosis not present

## 2018-10-16 DIAGNOSIS — D631 Anemia in chronic kidney disease: Secondary | ICD-10-CM | POA: Diagnosis not present

## 2018-10-16 DIAGNOSIS — E1029 Type 1 diabetes mellitus with other diabetic kidney complication: Secondary | ICD-10-CM | POA: Diagnosis not present

## 2018-10-16 DIAGNOSIS — D509 Iron deficiency anemia, unspecified: Secondary | ICD-10-CM | POA: Diagnosis not present

## 2018-10-16 DIAGNOSIS — N2581 Secondary hyperparathyroidism of renal origin: Secondary | ICD-10-CM | POA: Diagnosis not present

## 2018-10-19 DIAGNOSIS — E1029 Type 1 diabetes mellitus with other diabetic kidney complication: Secondary | ICD-10-CM | POA: Diagnosis not present

## 2018-10-19 DIAGNOSIS — D509 Iron deficiency anemia, unspecified: Secondary | ICD-10-CM | POA: Diagnosis not present

## 2018-10-19 DIAGNOSIS — N186 End stage renal disease: Secondary | ICD-10-CM | POA: Diagnosis not present

## 2018-10-19 DIAGNOSIS — D631 Anemia in chronic kidney disease: Secondary | ICD-10-CM | POA: Diagnosis not present

## 2018-10-19 DIAGNOSIS — N2581 Secondary hyperparathyroidism of renal origin: Secondary | ICD-10-CM | POA: Diagnosis not present

## 2018-10-20 DIAGNOSIS — Z992 Dependence on renal dialysis: Secondary | ICD-10-CM | POA: Diagnosis not present

## 2018-10-20 DIAGNOSIS — E1029 Type 1 diabetes mellitus with other diabetic kidney complication: Secondary | ICD-10-CM | POA: Diagnosis not present

## 2018-10-20 DIAGNOSIS — N186 End stage renal disease: Secondary | ICD-10-CM | POA: Diagnosis not present

## 2018-10-21 DIAGNOSIS — D509 Iron deficiency anemia, unspecified: Secondary | ICD-10-CM | POA: Diagnosis not present

## 2018-10-21 DIAGNOSIS — N2581 Secondary hyperparathyroidism of renal origin: Secondary | ICD-10-CM | POA: Diagnosis not present

## 2018-10-21 DIAGNOSIS — N186 End stage renal disease: Secondary | ICD-10-CM | POA: Diagnosis not present

## 2018-10-21 DIAGNOSIS — D631 Anemia in chronic kidney disease: Secondary | ICD-10-CM | POA: Diagnosis not present

## 2018-10-21 DIAGNOSIS — E1029 Type 1 diabetes mellitus with other diabetic kidney complication: Secondary | ICD-10-CM | POA: Diagnosis not present

## 2018-10-21 DIAGNOSIS — R739 Hyperglycemia, unspecified: Secondary | ICD-10-CM | POA: Diagnosis not present

## 2018-10-23 DIAGNOSIS — D631 Anemia in chronic kidney disease: Secondary | ICD-10-CM | POA: Diagnosis not present

## 2018-10-23 DIAGNOSIS — R739 Hyperglycemia, unspecified: Secondary | ICD-10-CM | POA: Diagnosis not present

## 2018-10-23 DIAGNOSIS — N186 End stage renal disease: Secondary | ICD-10-CM | POA: Diagnosis not present

## 2018-10-23 DIAGNOSIS — N2581 Secondary hyperparathyroidism of renal origin: Secondary | ICD-10-CM | POA: Diagnosis not present

## 2018-10-23 DIAGNOSIS — D509 Iron deficiency anemia, unspecified: Secondary | ICD-10-CM | POA: Diagnosis not present

## 2018-10-23 DIAGNOSIS — E1029 Type 1 diabetes mellitus with other diabetic kidney complication: Secondary | ICD-10-CM | POA: Diagnosis not present

## 2018-10-26 DIAGNOSIS — E1029 Type 1 diabetes mellitus with other diabetic kidney complication: Secondary | ICD-10-CM | POA: Diagnosis not present

## 2018-10-26 DIAGNOSIS — D509 Iron deficiency anemia, unspecified: Secondary | ICD-10-CM | POA: Diagnosis not present

## 2018-10-26 DIAGNOSIS — D631 Anemia in chronic kidney disease: Secondary | ICD-10-CM | POA: Diagnosis not present

## 2018-10-26 DIAGNOSIS — R739 Hyperglycemia, unspecified: Secondary | ICD-10-CM | POA: Diagnosis not present

## 2018-10-26 DIAGNOSIS — N2581 Secondary hyperparathyroidism of renal origin: Secondary | ICD-10-CM | POA: Diagnosis not present

## 2018-10-26 DIAGNOSIS — N186 End stage renal disease: Secondary | ICD-10-CM | POA: Diagnosis not present

## 2018-10-28 DIAGNOSIS — N186 End stage renal disease: Secondary | ICD-10-CM | POA: Diagnosis not present

## 2018-10-28 DIAGNOSIS — R739 Hyperglycemia, unspecified: Secondary | ICD-10-CM | POA: Diagnosis not present

## 2018-10-28 DIAGNOSIS — D631 Anemia in chronic kidney disease: Secondary | ICD-10-CM | POA: Diagnosis not present

## 2018-10-28 DIAGNOSIS — N2581 Secondary hyperparathyroidism of renal origin: Secondary | ICD-10-CM | POA: Diagnosis not present

## 2018-10-28 DIAGNOSIS — E1029 Type 1 diabetes mellitus with other diabetic kidney complication: Secondary | ICD-10-CM | POA: Diagnosis not present

## 2018-10-28 DIAGNOSIS — D509 Iron deficiency anemia, unspecified: Secondary | ICD-10-CM | POA: Diagnosis not present

## 2018-10-30 DIAGNOSIS — E1029 Type 1 diabetes mellitus with other diabetic kidney complication: Secondary | ICD-10-CM | POA: Diagnosis not present

## 2018-10-30 DIAGNOSIS — N2581 Secondary hyperparathyroidism of renal origin: Secondary | ICD-10-CM | POA: Diagnosis not present

## 2018-10-30 DIAGNOSIS — D631 Anemia in chronic kidney disease: Secondary | ICD-10-CM | POA: Diagnosis not present

## 2018-10-30 DIAGNOSIS — D509 Iron deficiency anemia, unspecified: Secondary | ICD-10-CM | POA: Diagnosis not present

## 2018-10-30 DIAGNOSIS — N186 End stage renal disease: Secondary | ICD-10-CM | POA: Diagnosis not present

## 2018-10-30 DIAGNOSIS — R739 Hyperglycemia, unspecified: Secondary | ICD-10-CM | POA: Diagnosis not present

## 2018-11-02 DIAGNOSIS — R739 Hyperglycemia, unspecified: Secondary | ICD-10-CM | POA: Diagnosis not present

## 2018-11-02 DIAGNOSIS — D631 Anemia in chronic kidney disease: Secondary | ICD-10-CM | POA: Diagnosis not present

## 2018-11-02 DIAGNOSIS — E1029 Type 1 diabetes mellitus with other diabetic kidney complication: Secondary | ICD-10-CM | POA: Diagnosis not present

## 2018-11-02 DIAGNOSIS — N186 End stage renal disease: Secondary | ICD-10-CM | POA: Diagnosis not present

## 2018-11-02 DIAGNOSIS — D509 Iron deficiency anemia, unspecified: Secondary | ICD-10-CM | POA: Diagnosis not present

## 2018-11-02 DIAGNOSIS — N2581 Secondary hyperparathyroidism of renal origin: Secondary | ICD-10-CM | POA: Diagnosis not present

## 2018-11-04 DIAGNOSIS — D509 Iron deficiency anemia, unspecified: Secondary | ICD-10-CM | POA: Diagnosis not present

## 2018-11-04 DIAGNOSIS — N2581 Secondary hyperparathyroidism of renal origin: Secondary | ICD-10-CM | POA: Diagnosis not present

## 2018-11-04 DIAGNOSIS — R739 Hyperglycemia, unspecified: Secondary | ICD-10-CM | POA: Diagnosis not present

## 2018-11-04 DIAGNOSIS — E1029 Type 1 diabetes mellitus with other diabetic kidney complication: Secondary | ICD-10-CM | POA: Diagnosis not present

## 2018-11-04 DIAGNOSIS — D631 Anemia in chronic kidney disease: Secondary | ICD-10-CM | POA: Diagnosis not present

## 2018-11-04 DIAGNOSIS — N186 End stage renal disease: Secondary | ICD-10-CM | POA: Diagnosis not present

## 2018-11-06 DIAGNOSIS — D631 Anemia in chronic kidney disease: Secondary | ICD-10-CM | POA: Diagnosis not present

## 2018-11-06 DIAGNOSIS — N186 End stage renal disease: Secondary | ICD-10-CM | POA: Diagnosis not present

## 2018-11-06 DIAGNOSIS — R739 Hyperglycemia, unspecified: Secondary | ICD-10-CM | POA: Diagnosis not present

## 2018-11-06 DIAGNOSIS — E1029 Type 1 diabetes mellitus with other diabetic kidney complication: Secondary | ICD-10-CM | POA: Diagnosis not present

## 2018-11-06 DIAGNOSIS — D509 Iron deficiency anemia, unspecified: Secondary | ICD-10-CM | POA: Diagnosis not present

## 2018-11-06 DIAGNOSIS — N2581 Secondary hyperparathyroidism of renal origin: Secondary | ICD-10-CM | POA: Diagnosis not present

## 2018-11-09 DIAGNOSIS — R739 Hyperglycemia, unspecified: Secondary | ICD-10-CM | POA: Diagnosis not present

## 2018-11-09 DIAGNOSIS — D509 Iron deficiency anemia, unspecified: Secondary | ICD-10-CM | POA: Diagnosis not present

## 2018-11-09 DIAGNOSIS — N186 End stage renal disease: Secondary | ICD-10-CM | POA: Diagnosis not present

## 2018-11-09 DIAGNOSIS — E1029 Type 1 diabetes mellitus with other diabetic kidney complication: Secondary | ICD-10-CM | POA: Diagnosis not present

## 2018-11-09 DIAGNOSIS — N2581 Secondary hyperparathyroidism of renal origin: Secondary | ICD-10-CM | POA: Diagnosis not present

## 2018-11-09 DIAGNOSIS — D631 Anemia in chronic kidney disease: Secondary | ICD-10-CM | POA: Diagnosis not present

## 2018-11-11 DIAGNOSIS — D631 Anemia in chronic kidney disease: Secondary | ICD-10-CM | POA: Diagnosis not present

## 2018-11-11 DIAGNOSIS — R739 Hyperglycemia, unspecified: Secondary | ICD-10-CM | POA: Diagnosis not present

## 2018-11-11 DIAGNOSIS — E1029 Type 1 diabetes mellitus with other diabetic kidney complication: Secondary | ICD-10-CM | POA: Diagnosis not present

## 2018-11-11 DIAGNOSIS — D509 Iron deficiency anemia, unspecified: Secondary | ICD-10-CM | POA: Diagnosis not present

## 2018-11-11 DIAGNOSIS — N186 End stage renal disease: Secondary | ICD-10-CM | POA: Diagnosis not present

## 2018-11-11 DIAGNOSIS — N2581 Secondary hyperparathyroidism of renal origin: Secondary | ICD-10-CM | POA: Diagnosis not present

## 2018-11-13 DIAGNOSIS — D631 Anemia in chronic kidney disease: Secondary | ICD-10-CM | POA: Diagnosis not present

## 2018-11-13 DIAGNOSIS — R739 Hyperglycemia, unspecified: Secondary | ICD-10-CM | POA: Diagnosis not present

## 2018-11-13 DIAGNOSIS — D509 Iron deficiency anemia, unspecified: Secondary | ICD-10-CM | POA: Diagnosis not present

## 2018-11-13 DIAGNOSIS — N186 End stage renal disease: Secondary | ICD-10-CM | POA: Diagnosis not present

## 2018-11-13 DIAGNOSIS — E1029 Type 1 diabetes mellitus with other diabetic kidney complication: Secondary | ICD-10-CM | POA: Diagnosis not present

## 2018-11-13 DIAGNOSIS — N2581 Secondary hyperparathyroidism of renal origin: Secondary | ICD-10-CM | POA: Diagnosis not present

## 2018-11-16 DIAGNOSIS — N2581 Secondary hyperparathyroidism of renal origin: Secondary | ICD-10-CM | POA: Diagnosis not present

## 2018-11-16 DIAGNOSIS — N186 End stage renal disease: Secondary | ICD-10-CM | POA: Diagnosis not present

## 2018-11-16 DIAGNOSIS — E1029 Type 1 diabetes mellitus with other diabetic kidney complication: Secondary | ICD-10-CM | POA: Diagnosis not present

## 2018-11-16 DIAGNOSIS — D631 Anemia in chronic kidney disease: Secondary | ICD-10-CM | POA: Diagnosis not present

## 2018-11-16 DIAGNOSIS — D509 Iron deficiency anemia, unspecified: Secondary | ICD-10-CM | POA: Diagnosis not present

## 2018-11-16 DIAGNOSIS — R739 Hyperglycemia, unspecified: Secondary | ICD-10-CM | POA: Diagnosis not present

## 2018-11-18 DIAGNOSIS — E1029 Type 1 diabetes mellitus with other diabetic kidney complication: Secondary | ICD-10-CM | POA: Diagnosis not present

## 2018-11-18 DIAGNOSIS — R739 Hyperglycemia, unspecified: Secondary | ICD-10-CM | POA: Diagnosis not present

## 2018-11-18 DIAGNOSIS — D631 Anemia in chronic kidney disease: Secondary | ICD-10-CM | POA: Diagnosis not present

## 2018-11-18 DIAGNOSIS — N186 End stage renal disease: Secondary | ICD-10-CM | POA: Diagnosis not present

## 2018-11-18 DIAGNOSIS — D509 Iron deficiency anemia, unspecified: Secondary | ICD-10-CM | POA: Diagnosis not present

## 2018-11-18 DIAGNOSIS — N2581 Secondary hyperparathyroidism of renal origin: Secondary | ICD-10-CM | POA: Diagnosis not present

## 2018-11-19 DIAGNOSIS — E1029 Type 1 diabetes mellitus with other diabetic kidney complication: Secondary | ICD-10-CM | POA: Diagnosis not present

## 2018-11-19 DIAGNOSIS — Z992 Dependence on renal dialysis: Secondary | ICD-10-CM | POA: Diagnosis not present

## 2018-11-19 DIAGNOSIS — N186 End stage renal disease: Secondary | ICD-10-CM | POA: Diagnosis not present

## 2018-11-20 DIAGNOSIS — N186 End stage renal disease: Secondary | ICD-10-CM | POA: Diagnosis not present

## 2018-11-20 DIAGNOSIS — E1029 Type 1 diabetes mellitus with other diabetic kidney complication: Secondary | ICD-10-CM | POA: Diagnosis not present

## 2018-11-20 DIAGNOSIS — D631 Anemia in chronic kidney disease: Secondary | ICD-10-CM | POA: Diagnosis not present

## 2018-11-20 DIAGNOSIS — N2581 Secondary hyperparathyroidism of renal origin: Secondary | ICD-10-CM | POA: Diagnosis not present

## 2018-11-23 DIAGNOSIS — D631 Anemia in chronic kidney disease: Secondary | ICD-10-CM | POA: Diagnosis not present

## 2018-11-23 DIAGNOSIS — E1029 Type 1 diabetes mellitus with other diabetic kidney complication: Secondary | ICD-10-CM | POA: Diagnosis not present

## 2018-11-23 DIAGNOSIS — N2581 Secondary hyperparathyroidism of renal origin: Secondary | ICD-10-CM | POA: Diagnosis not present

## 2018-11-23 DIAGNOSIS — N186 End stage renal disease: Secondary | ICD-10-CM | POA: Diagnosis not present

## 2018-11-24 DIAGNOSIS — Z794 Long term (current) use of insulin: Secondary | ICD-10-CM | POA: Diagnosis not present

## 2018-11-24 DIAGNOSIS — E103593 Type 1 diabetes mellitus with proliferative diabetic retinopathy without macular edema, bilateral: Secondary | ICD-10-CM | POA: Diagnosis not present

## 2018-11-24 DIAGNOSIS — E1022 Type 1 diabetes mellitus with diabetic chronic kidney disease: Secondary | ICD-10-CM | POA: Diagnosis not present

## 2018-11-24 DIAGNOSIS — N186 End stage renal disease: Secondary | ICD-10-CM | POA: Diagnosis not present

## 2018-11-24 DIAGNOSIS — Z992 Dependence on renal dialysis: Secondary | ICD-10-CM | POA: Diagnosis not present

## 2018-11-25 DIAGNOSIS — D631 Anemia in chronic kidney disease: Secondary | ICD-10-CM | POA: Diagnosis not present

## 2018-11-25 DIAGNOSIS — E1029 Type 1 diabetes mellitus with other diabetic kidney complication: Secondary | ICD-10-CM | POA: Diagnosis not present

## 2018-11-25 DIAGNOSIS — N186 End stage renal disease: Secondary | ICD-10-CM | POA: Diagnosis not present

## 2018-11-25 DIAGNOSIS — N2581 Secondary hyperparathyroidism of renal origin: Secondary | ICD-10-CM | POA: Diagnosis not present

## 2018-11-27 DIAGNOSIS — D631 Anemia in chronic kidney disease: Secondary | ICD-10-CM | POA: Diagnosis not present

## 2018-11-27 DIAGNOSIS — N186 End stage renal disease: Secondary | ICD-10-CM | POA: Diagnosis not present

## 2018-11-27 DIAGNOSIS — E1029 Type 1 diabetes mellitus with other diabetic kidney complication: Secondary | ICD-10-CM | POA: Diagnosis not present

## 2018-11-27 DIAGNOSIS — N2581 Secondary hyperparathyroidism of renal origin: Secondary | ICD-10-CM | POA: Diagnosis not present

## 2018-11-30 DIAGNOSIS — E1029 Type 1 diabetes mellitus with other diabetic kidney complication: Secondary | ICD-10-CM | POA: Diagnosis not present

## 2018-11-30 DIAGNOSIS — D631 Anemia in chronic kidney disease: Secondary | ICD-10-CM | POA: Diagnosis not present

## 2018-11-30 DIAGNOSIS — N186 End stage renal disease: Secondary | ICD-10-CM | POA: Diagnosis not present

## 2018-11-30 DIAGNOSIS — N2581 Secondary hyperparathyroidism of renal origin: Secondary | ICD-10-CM | POA: Diagnosis not present

## 2018-12-02 DIAGNOSIS — N186 End stage renal disease: Secondary | ICD-10-CM | POA: Diagnosis not present

## 2018-12-02 DIAGNOSIS — N2581 Secondary hyperparathyroidism of renal origin: Secondary | ICD-10-CM | POA: Diagnosis not present

## 2018-12-02 DIAGNOSIS — E1029 Type 1 diabetes mellitus with other diabetic kidney complication: Secondary | ICD-10-CM | POA: Diagnosis not present

## 2018-12-02 DIAGNOSIS — D631 Anemia in chronic kidney disease: Secondary | ICD-10-CM | POA: Diagnosis not present

## 2018-12-04 DIAGNOSIS — D631 Anemia in chronic kidney disease: Secondary | ICD-10-CM | POA: Diagnosis not present

## 2018-12-04 DIAGNOSIS — N186 End stage renal disease: Secondary | ICD-10-CM | POA: Diagnosis not present

## 2018-12-04 DIAGNOSIS — E1029 Type 1 diabetes mellitus with other diabetic kidney complication: Secondary | ICD-10-CM | POA: Diagnosis not present

## 2018-12-04 DIAGNOSIS — N2581 Secondary hyperparathyroidism of renal origin: Secondary | ICD-10-CM | POA: Diagnosis not present

## 2018-12-07 DIAGNOSIS — N186 End stage renal disease: Secondary | ICD-10-CM | POA: Diagnosis not present

## 2018-12-07 DIAGNOSIS — E1029 Type 1 diabetes mellitus with other diabetic kidney complication: Secondary | ICD-10-CM | POA: Diagnosis not present

## 2018-12-07 DIAGNOSIS — D631 Anemia in chronic kidney disease: Secondary | ICD-10-CM | POA: Diagnosis not present

## 2018-12-07 DIAGNOSIS — N2581 Secondary hyperparathyroidism of renal origin: Secondary | ICD-10-CM | POA: Diagnosis not present

## 2018-12-09 DIAGNOSIS — D631 Anemia in chronic kidney disease: Secondary | ICD-10-CM | POA: Diagnosis not present

## 2018-12-09 DIAGNOSIS — E1029 Type 1 diabetes mellitus with other diabetic kidney complication: Secondary | ICD-10-CM | POA: Diagnosis not present

## 2018-12-09 DIAGNOSIS — N186 End stage renal disease: Secondary | ICD-10-CM | POA: Diagnosis not present

## 2018-12-09 DIAGNOSIS — N2581 Secondary hyperparathyroidism of renal origin: Secondary | ICD-10-CM | POA: Diagnosis not present

## 2018-12-11 DIAGNOSIS — N2581 Secondary hyperparathyroidism of renal origin: Secondary | ICD-10-CM | POA: Diagnosis not present

## 2018-12-11 DIAGNOSIS — D631 Anemia in chronic kidney disease: Secondary | ICD-10-CM | POA: Diagnosis not present

## 2018-12-11 DIAGNOSIS — E1029 Type 1 diabetes mellitus with other diabetic kidney complication: Secondary | ICD-10-CM | POA: Diagnosis not present

## 2018-12-11 DIAGNOSIS — N186 End stage renal disease: Secondary | ICD-10-CM | POA: Diagnosis not present

## 2018-12-14 DIAGNOSIS — E1029 Type 1 diabetes mellitus with other diabetic kidney complication: Secondary | ICD-10-CM | POA: Diagnosis not present

## 2018-12-14 DIAGNOSIS — D631 Anemia in chronic kidney disease: Secondary | ICD-10-CM | POA: Diagnosis not present

## 2018-12-14 DIAGNOSIS — N186 End stage renal disease: Secondary | ICD-10-CM | POA: Diagnosis not present

## 2018-12-14 DIAGNOSIS — N2581 Secondary hyperparathyroidism of renal origin: Secondary | ICD-10-CM | POA: Diagnosis not present

## 2018-12-16 DIAGNOSIS — D631 Anemia in chronic kidney disease: Secondary | ICD-10-CM | POA: Diagnosis not present

## 2018-12-16 DIAGNOSIS — E1029 Type 1 diabetes mellitus with other diabetic kidney complication: Secondary | ICD-10-CM | POA: Diagnosis not present

## 2018-12-16 DIAGNOSIS — N186 End stage renal disease: Secondary | ICD-10-CM | POA: Diagnosis not present

## 2018-12-16 DIAGNOSIS — N2581 Secondary hyperparathyroidism of renal origin: Secondary | ICD-10-CM | POA: Diagnosis not present

## 2018-12-18 DIAGNOSIS — D631 Anemia in chronic kidney disease: Secondary | ICD-10-CM | POA: Diagnosis not present

## 2018-12-18 DIAGNOSIS — N2581 Secondary hyperparathyroidism of renal origin: Secondary | ICD-10-CM | POA: Diagnosis not present

## 2018-12-18 DIAGNOSIS — E1029 Type 1 diabetes mellitus with other diabetic kidney complication: Secondary | ICD-10-CM | POA: Diagnosis not present

## 2018-12-18 DIAGNOSIS — N186 End stage renal disease: Secondary | ICD-10-CM | POA: Diagnosis not present

## 2018-12-20 DIAGNOSIS — E1029 Type 1 diabetes mellitus with other diabetic kidney complication: Secondary | ICD-10-CM | POA: Diagnosis not present

## 2018-12-20 DIAGNOSIS — N186 End stage renal disease: Secondary | ICD-10-CM | POA: Diagnosis not present

## 2018-12-20 DIAGNOSIS — Z992 Dependence on renal dialysis: Secondary | ICD-10-CM | POA: Diagnosis not present

## 2018-12-21 DIAGNOSIS — N2581 Secondary hyperparathyroidism of renal origin: Secondary | ICD-10-CM | POA: Diagnosis not present

## 2018-12-21 DIAGNOSIS — R739 Hyperglycemia, unspecified: Secondary | ICD-10-CM | POA: Diagnosis not present

## 2018-12-21 DIAGNOSIS — E1029 Type 1 diabetes mellitus with other diabetic kidney complication: Secondary | ICD-10-CM | POA: Diagnosis not present

## 2018-12-21 DIAGNOSIS — D631 Anemia in chronic kidney disease: Secondary | ICD-10-CM | POA: Diagnosis not present

## 2018-12-21 DIAGNOSIS — N186 End stage renal disease: Secondary | ICD-10-CM | POA: Diagnosis not present

## 2018-12-21 DIAGNOSIS — D509 Iron deficiency anemia, unspecified: Secondary | ICD-10-CM | POA: Diagnosis not present

## 2018-12-23 DIAGNOSIS — N2581 Secondary hyperparathyroidism of renal origin: Secondary | ICD-10-CM | POA: Diagnosis not present

## 2018-12-23 DIAGNOSIS — D631 Anemia in chronic kidney disease: Secondary | ICD-10-CM | POA: Diagnosis not present

## 2018-12-23 DIAGNOSIS — R739 Hyperglycemia, unspecified: Secondary | ICD-10-CM | POA: Diagnosis not present

## 2018-12-23 DIAGNOSIS — D509 Iron deficiency anemia, unspecified: Secondary | ICD-10-CM | POA: Diagnosis not present

## 2018-12-23 DIAGNOSIS — N186 End stage renal disease: Secondary | ICD-10-CM | POA: Diagnosis not present

## 2018-12-23 DIAGNOSIS — E1029 Type 1 diabetes mellitus with other diabetic kidney complication: Secondary | ICD-10-CM | POA: Diagnosis not present

## 2018-12-25 DIAGNOSIS — R739 Hyperglycemia, unspecified: Secondary | ICD-10-CM | POA: Diagnosis not present

## 2018-12-25 DIAGNOSIS — N186 End stage renal disease: Secondary | ICD-10-CM | POA: Diagnosis not present

## 2018-12-25 DIAGNOSIS — N2581 Secondary hyperparathyroidism of renal origin: Secondary | ICD-10-CM | POA: Diagnosis not present

## 2018-12-25 DIAGNOSIS — D509 Iron deficiency anemia, unspecified: Secondary | ICD-10-CM | POA: Diagnosis not present

## 2018-12-25 DIAGNOSIS — E1029 Type 1 diabetes mellitus with other diabetic kidney complication: Secondary | ICD-10-CM | POA: Diagnosis not present

## 2018-12-25 DIAGNOSIS — D631 Anemia in chronic kidney disease: Secondary | ICD-10-CM | POA: Diagnosis not present

## 2018-12-28 DIAGNOSIS — E1029 Type 1 diabetes mellitus with other diabetic kidney complication: Secondary | ICD-10-CM | POA: Diagnosis not present

## 2018-12-28 DIAGNOSIS — N186 End stage renal disease: Secondary | ICD-10-CM | POA: Diagnosis not present

## 2018-12-28 DIAGNOSIS — D509 Iron deficiency anemia, unspecified: Secondary | ICD-10-CM | POA: Diagnosis not present

## 2018-12-28 DIAGNOSIS — D631 Anemia in chronic kidney disease: Secondary | ICD-10-CM | POA: Diagnosis not present

## 2018-12-28 DIAGNOSIS — R739 Hyperglycemia, unspecified: Secondary | ICD-10-CM | POA: Diagnosis not present

## 2018-12-28 DIAGNOSIS — N2581 Secondary hyperparathyroidism of renal origin: Secondary | ICD-10-CM | POA: Diagnosis not present

## 2018-12-30 DIAGNOSIS — R739 Hyperglycemia, unspecified: Secondary | ICD-10-CM | POA: Diagnosis not present

## 2018-12-30 DIAGNOSIS — N186 End stage renal disease: Secondary | ICD-10-CM | POA: Diagnosis not present

## 2018-12-30 DIAGNOSIS — D509 Iron deficiency anemia, unspecified: Secondary | ICD-10-CM | POA: Diagnosis not present

## 2018-12-30 DIAGNOSIS — E1029 Type 1 diabetes mellitus with other diabetic kidney complication: Secondary | ICD-10-CM | POA: Diagnosis not present

## 2018-12-30 DIAGNOSIS — D631 Anemia in chronic kidney disease: Secondary | ICD-10-CM | POA: Diagnosis not present

## 2018-12-30 DIAGNOSIS — N2581 Secondary hyperparathyroidism of renal origin: Secondary | ICD-10-CM | POA: Diagnosis not present

## 2019-01-01 DIAGNOSIS — N186 End stage renal disease: Secondary | ICD-10-CM | POA: Diagnosis not present

## 2019-01-01 DIAGNOSIS — E1029 Type 1 diabetes mellitus with other diabetic kidney complication: Secondary | ICD-10-CM | POA: Diagnosis not present

## 2019-01-01 DIAGNOSIS — D631 Anemia in chronic kidney disease: Secondary | ICD-10-CM | POA: Diagnosis not present

## 2019-01-01 DIAGNOSIS — R739 Hyperglycemia, unspecified: Secondary | ICD-10-CM | POA: Diagnosis not present

## 2019-01-01 DIAGNOSIS — D509 Iron deficiency anemia, unspecified: Secondary | ICD-10-CM | POA: Diagnosis not present

## 2019-01-01 DIAGNOSIS — N2581 Secondary hyperparathyroidism of renal origin: Secondary | ICD-10-CM | POA: Diagnosis not present

## 2019-01-04 DIAGNOSIS — R739 Hyperglycemia, unspecified: Secondary | ICD-10-CM | POA: Diagnosis not present

## 2019-01-04 DIAGNOSIS — D509 Iron deficiency anemia, unspecified: Secondary | ICD-10-CM | POA: Diagnosis not present

## 2019-01-04 DIAGNOSIS — E1029 Type 1 diabetes mellitus with other diabetic kidney complication: Secondary | ICD-10-CM | POA: Diagnosis not present

## 2019-01-04 DIAGNOSIS — D631 Anemia in chronic kidney disease: Secondary | ICD-10-CM | POA: Diagnosis not present

## 2019-01-04 DIAGNOSIS — N186 End stage renal disease: Secondary | ICD-10-CM | POA: Diagnosis not present

## 2019-01-04 DIAGNOSIS — N2581 Secondary hyperparathyroidism of renal origin: Secondary | ICD-10-CM | POA: Diagnosis not present

## 2019-01-06 DIAGNOSIS — E1029 Type 1 diabetes mellitus with other diabetic kidney complication: Secondary | ICD-10-CM | POA: Diagnosis not present

## 2019-01-06 DIAGNOSIS — D631 Anemia in chronic kidney disease: Secondary | ICD-10-CM | POA: Diagnosis not present

## 2019-01-06 DIAGNOSIS — N186 End stage renal disease: Secondary | ICD-10-CM | POA: Diagnosis not present

## 2019-01-06 DIAGNOSIS — N2581 Secondary hyperparathyroidism of renal origin: Secondary | ICD-10-CM | POA: Diagnosis not present

## 2019-01-06 DIAGNOSIS — D509 Iron deficiency anemia, unspecified: Secondary | ICD-10-CM | POA: Diagnosis not present

## 2019-01-06 DIAGNOSIS — R739 Hyperglycemia, unspecified: Secondary | ICD-10-CM | POA: Diagnosis not present

## 2019-01-08 DIAGNOSIS — R739 Hyperglycemia, unspecified: Secondary | ICD-10-CM | POA: Diagnosis not present

## 2019-01-08 DIAGNOSIS — N186 End stage renal disease: Secondary | ICD-10-CM | POA: Diagnosis not present

## 2019-01-08 DIAGNOSIS — D631 Anemia in chronic kidney disease: Secondary | ICD-10-CM | POA: Diagnosis not present

## 2019-01-08 DIAGNOSIS — D509 Iron deficiency anemia, unspecified: Secondary | ICD-10-CM | POA: Diagnosis not present

## 2019-01-08 DIAGNOSIS — E1029 Type 1 diabetes mellitus with other diabetic kidney complication: Secondary | ICD-10-CM | POA: Diagnosis not present

## 2019-01-08 DIAGNOSIS — N2581 Secondary hyperparathyroidism of renal origin: Secondary | ICD-10-CM | POA: Diagnosis not present

## 2019-01-11 DIAGNOSIS — E1029 Type 1 diabetes mellitus with other diabetic kidney complication: Secondary | ICD-10-CM | POA: Diagnosis not present

## 2019-01-11 DIAGNOSIS — R739 Hyperglycemia, unspecified: Secondary | ICD-10-CM | POA: Diagnosis not present

## 2019-01-11 DIAGNOSIS — N2581 Secondary hyperparathyroidism of renal origin: Secondary | ICD-10-CM | POA: Diagnosis not present

## 2019-01-11 DIAGNOSIS — D509 Iron deficiency anemia, unspecified: Secondary | ICD-10-CM | POA: Diagnosis not present

## 2019-01-11 DIAGNOSIS — D631 Anemia in chronic kidney disease: Secondary | ICD-10-CM | POA: Diagnosis not present

## 2019-01-11 DIAGNOSIS — N186 End stage renal disease: Secondary | ICD-10-CM | POA: Diagnosis not present

## 2019-01-13 DIAGNOSIS — D631 Anemia in chronic kidney disease: Secondary | ICD-10-CM | POA: Diagnosis not present

## 2019-01-13 DIAGNOSIS — E1029 Type 1 diabetes mellitus with other diabetic kidney complication: Secondary | ICD-10-CM | POA: Diagnosis not present

## 2019-01-13 DIAGNOSIS — N2581 Secondary hyperparathyroidism of renal origin: Secondary | ICD-10-CM | POA: Diagnosis not present

## 2019-01-13 DIAGNOSIS — R739 Hyperglycemia, unspecified: Secondary | ICD-10-CM | POA: Diagnosis not present

## 2019-01-13 DIAGNOSIS — N186 End stage renal disease: Secondary | ICD-10-CM | POA: Diagnosis not present

## 2019-01-13 DIAGNOSIS — D509 Iron deficiency anemia, unspecified: Secondary | ICD-10-CM | POA: Diagnosis not present

## 2019-01-15 DIAGNOSIS — E1029 Type 1 diabetes mellitus with other diabetic kidney complication: Secondary | ICD-10-CM | POA: Diagnosis not present

## 2019-01-15 DIAGNOSIS — D631 Anemia in chronic kidney disease: Secondary | ICD-10-CM | POA: Diagnosis not present

## 2019-01-15 DIAGNOSIS — N2581 Secondary hyperparathyroidism of renal origin: Secondary | ICD-10-CM | POA: Diagnosis not present

## 2019-01-15 DIAGNOSIS — N186 End stage renal disease: Secondary | ICD-10-CM | POA: Diagnosis not present

## 2019-01-15 DIAGNOSIS — D509 Iron deficiency anemia, unspecified: Secondary | ICD-10-CM | POA: Diagnosis not present

## 2019-01-15 DIAGNOSIS — R739 Hyperglycemia, unspecified: Secondary | ICD-10-CM | POA: Diagnosis not present

## 2019-01-18 DIAGNOSIS — E1029 Type 1 diabetes mellitus with other diabetic kidney complication: Secondary | ICD-10-CM | POA: Diagnosis not present

## 2019-01-18 DIAGNOSIS — N2581 Secondary hyperparathyroidism of renal origin: Secondary | ICD-10-CM | POA: Diagnosis not present

## 2019-01-18 DIAGNOSIS — R739 Hyperglycemia, unspecified: Secondary | ICD-10-CM | POA: Diagnosis not present

## 2019-01-18 DIAGNOSIS — D631 Anemia in chronic kidney disease: Secondary | ICD-10-CM | POA: Diagnosis not present

## 2019-01-18 DIAGNOSIS — D509 Iron deficiency anemia, unspecified: Secondary | ICD-10-CM | POA: Diagnosis not present

## 2019-01-18 DIAGNOSIS — N186 End stage renal disease: Secondary | ICD-10-CM | POA: Diagnosis not present

## 2019-01-19 DIAGNOSIS — E1029 Type 1 diabetes mellitus with other diabetic kidney complication: Secondary | ICD-10-CM | POA: Diagnosis not present

## 2019-01-19 DIAGNOSIS — N186 End stage renal disease: Secondary | ICD-10-CM | POA: Diagnosis not present

## 2019-01-19 DIAGNOSIS — Z992 Dependence on renal dialysis: Secondary | ICD-10-CM | POA: Diagnosis not present

## 2019-01-20 DIAGNOSIS — R739 Hyperglycemia, unspecified: Secondary | ICD-10-CM | POA: Diagnosis not present

## 2019-01-20 DIAGNOSIS — N186 End stage renal disease: Secondary | ICD-10-CM | POA: Diagnosis not present

## 2019-01-20 DIAGNOSIS — D509 Iron deficiency anemia, unspecified: Secondary | ICD-10-CM | POA: Diagnosis not present

## 2019-01-20 DIAGNOSIS — N2581 Secondary hyperparathyroidism of renal origin: Secondary | ICD-10-CM | POA: Diagnosis not present

## 2019-01-20 DIAGNOSIS — E1029 Type 1 diabetes mellitus with other diabetic kidney complication: Secondary | ICD-10-CM | POA: Diagnosis not present

## 2019-01-20 DIAGNOSIS — D631 Anemia in chronic kidney disease: Secondary | ICD-10-CM | POA: Diagnosis not present

## 2019-01-22 DIAGNOSIS — N186 End stage renal disease: Secondary | ICD-10-CM | POA: Diagnosis not present

## 2019-01-22 DIAGNOSIS — R739 Hyperglycemia, unspecified: Secondary | ICD-10-CM | POA: Diagnosis not present

## 2019-01-22 DIAGNOSIS — D631 Anemia in chronic kidney disease: Secondary | ICD-10-CM | POA: Diagnosis not present

## 2019-01-22 DIAGNOSIS — N2581 Secondary hyperparathyroidism of renal origin: Secondary | ICD-10-CM | POA: Diagnosis not present

## 2019-01-22 DIAGNOSIS — D509 Iron deficiency anemia, unspecified: Secondary | ICD-10-CM | POA: Diagnosis not present

## 2019-01-22 DIAGNOSIS — E1029 Type 1 diabetes mellitus with other diabetic kidney complication: Secondary | ICD-10-CM | POA: Diagnosis not present

## 2019-01-25 DIAGNOSIS — E1029 Type 1 diabetes mellitus with other diabetic kidney complication: Secondary | ICD-10-CM | POA: Diagnosis not present

## 2019-01-25 DIAGNOSIS — R739 Hyperglycemia, unspecified: Secondary | ICD-10-CM | POA: Diagnosis not present

## 2019-01-25 DIAGNOSIS — D509 Iron deficiency anemia, unspecified: Secondary | ICD-10-CM | POA: Diagnosis not present

## 2019-01-25 DIAGNOSIS — D631 Anemia in chronic kidney disease: Secondary | ICD-10-CM | POA: Diagnosis not present

## 2019-01-25 DIAGNOSIS — N2581 Secondary hyperparathyroidism of renal origin: Secondary | ICD-10-CM | POA: Diagnosis not present

## 2019-01-25 DIAGNOSIS — N186 End stage renal disease: Secondary | ICD-10-CM | POA: Diagnosis not present

## 2019-01-27 DIAGNOSIS — N2581 Secondary hyperparathyroidism of renal origin: Secondary | ICD-10-CM | POA: Diagnosis not present

## 2019-01-27 DIAGNOSIS — N186 End stage renal disease: Secondary | ICD-10-CM | POA: Diagnosis not present

## 2019-01-27 DIAGNOSIS — D509 Iron deficiency anemia, unspecified: Secondary | ICD-10-CM | POA: Diagnosis not present

## 2019-01-27 DIAGNOSIS — D631 Anemia in chronic kidney disease: Secondary | ICD-10-CM | POA: Diagnosis not present

## 2019-01-27 DIAGNOSIS — E1029 Type 1 diabetes mellitus with other diabetic kidney complication: Secondary | ICD-10-CM | POA: Diagnosis not present

## 2019-01-27 DIAGNOSIS — R739 Hyperglycemia, unspecified: Secondary | ICD-10-CM | POA: Diagnosis not present

## 2019-01-29 DIAGNOSIS — N186 End stage renal disease: Secondary | ICD-10-CM | POA: Diagnosis not present

## 2019-01-29 DIAGNOSIS — D509 Iron deficiency anemia, unspecified: Secondary | ICD-10-CM | POA: Diagnosis not present

## 2019-01-29 DIAGNOSIS — N2581 Secondary hyperparathyroidism of renal origin: Secondary | ICD-10-CM | POA: Diagnosis not present

## 2019-01-29 DIAGNOSIS — E1029 Type 1 diabetes mellitus with other diabetic kidney complication: Secondary | ICD-10-CM | POA: Diagnosis not present

## 2019-01-29 DIAGNOSIS — D631 Anemia in chronic kidney disease: Secondary | ICD-10-CM | POA: Diagnosis not present

## 2019-01-29 DIAGNOSIS — R739 Hyperglycemia, unspecified: Secondary | ICD-10-CM | POA: Diagnosis not present

## 2019-02-01 DIAGNOSIS — D631 Anemia in chronic kidney disease: Secondary | ICD-10-CM | POA: Diagnosis not present

## 2019-02-01 DIAGNOSIS — N186 End stage renal disease: Secondary | ICD-10-CM | POA: Diagnosis not present

## 2019-02-01 DIAGNOSIS — E1029 Type 1 diabetes mellitus with other diabetic kidney complication: Secondary | ICD-10-CM | POA: Diagnosis not present

## 2019-02-01 DIAGNOSIS — R739 Hyperglycemia, unspecified: Secondary | ICD-10-CM | POA: Diagnosis not present

## 2019-02-01 DIAGNOSIS — N2581 Secondary hyperparathyroidism of renal origin: Secondary | ICD-10-CM | POA: Diagnosis not present

## 2019-02-01 DIAGNOSIS — D509 Iron deficiency anemia, unspecified: Secondary | ICD-10-CM | POA: Diagnosis not present

## 2019-02-03 DIAGNOSIS — D631 Anemia in chronic kidney disease: Secondary | ICD-10-CM | POA: Diagnosis not present

## 2019-02-03 DIAGNOSIS — E1029 Type 1 diabetes mellitus with other diabetic kidney complication: Secondary | ICD-10-CM | POA: Diagnosis not present

## 2019-02-03 DIAGNOSIS — N2581 Secondary hyperparathyroidism of renal origin: Secondary | ICD-10-CM | POA: Diagnosis not present

## 2019-02-03 DIAGNOSIS — N186 End stage renal disease: Secondary | ICD-10-CM | POA: Diagnosis not present

## 2019-02-03 DIAGNOSIS — D509 Iron deficiency anemia, unspecified: Secondary | ICD-10-CM | POA: Diagnosis not present

## 2019-02-03 DIAGNOSIS — R739 Hyperglycemia, unspecified: Secondary | ICD-10-CM | POA: Diagnosis not present

## 2019-02-05 DIAGNOSIS — R739 Hyperglycemia, unspecified: Secondary | ICD-10-CM | POA: Diagnosis not present

## 2019-02-05 DIAGNOSIS — E1029 Type 1 diabetes mellitus with other diabetic kidney complication: Secondary | ICD-10-CM | POA: Diagnosis not present

## 2019-02-05 DIAGNOSIS — N186 End stage renal disease: Secondary | ICD-10-CM | POA: Diagnosis not present

## 2019-02-05 DIAGNOSIS — D631 Anemia in chronic kidney disease: Secondary | ICD-10-CM | POA: Diagnosis not present

## 2019-02-05 DIAGNOSIS — D509 Iron deficiency anemia, unspecified: Secondary | ICD-10-CM | POA: Diagnosis not present

## 2019-02-05 DIAGNOSIS — N2581 Secondary hyperparathyroidism of renal origin: Secondary | ICD-10-CM | POA: Diagnosis not present

## 2019-02-08 DIAGNOSIS — D509 Iron deficiency anemia, unspecified: Secondary | ICD-10-CM | POA: Diagnosis not present

## 2019-02-08 DIAGNOSIS — D631 Anemia in chronic kidney disease: Secondary | ICD-10-CM | POA: Diagnosis not present

## 2019-02-08 DIAGNOSIS — R739 Hyperglycemia, unspecified: Secondary | ICD-10-CM | POA: Diagnosis not present

## 2019-02-08 DIAGNOSIS — N186 End stage renal disease: Secondary | ICD-10-CM | POA: Diagnosis not present

## 2019-02-08 DIAGNOSIS — N2581 Secondary hyperparathyroidism of renal origin: Secondary | ICD-10-CM | POA: Diagnosis not present

## 2019-02-08 DIAGNOSIS — E1029 Type 1 diabetes mellitus with other diabetic kidney complication: Secondary | ICD-10-CM | POA: Diagnosis not present

## 2019-02-10 DIAGNOSIS — N2581 Secondary hyperparathyroidism of renal origin: Secondary | ICD-10-CM | POA: Diagnosis not present

## 2019-02-10 DIAGNOSIS — D631 Anemia in chronic kidney disease: Secondary | ICD-10-CM | POA: Diagnosis not present

## 2019-02-10 DIAGNOSIS — R739 Hyperglycemia, unspecified: Secondary | ICD-10-CM | POA: Diagnosis not present

## 2019-02-10 DIAGNOSIS — N186 End stage renal disease: Secondary | ICD-10-CM | POA: Diagnosis not present

## 2019-02-10 DIAGNOSIS — D509 Iron deficiency anemia, unspecified: Secondary | ICD-10-CM | POA: Diagnosis not present

## 2019-02-10 DIAGNOSIS — E1029 Type 1 diabetes mellitus with other diabetic kidney complication: Secondary | ICD-10-CM | POA: Diagnosis not present

## 2019-02-12 DIAGNOSIS — E1029 Type 1 diabetes mellitus with other diabetic kidney complication: Secondary | ICD-10-CM | POA: Diagnosis not present

## 2019-02-12 DIAGNOSIS — D631 Anemia in chronic kidney disease: Secondary | ICD-10-CM | POA: Diagnosis not present

## 2019-02-12 DIAGNOSIS — N2581 Secondary hyperparathyroidism of renal origin: Secondary | ICD-10-CM | POA: Diagnosis not present

## 2019-02-12 DIAGNOSIS — N186 End stage renal disease: Secondary | ICD-10-CM | POA: Diagnosis not present

## 2019-02-12 DIAGNOSIS — D509 Iron deficiency anemia, unspecified: Secondary | ICD-10-CM | POA: Diagnosis not present

## 2019-02-12 DIAGNOSIS — R739 Hyperglycemia, unspecified: Secondary | ICD-10-CM | POA: Diagnosis not present

## 2019-02-15 DIAGNOSIS — D509 Iron deficiency anemia, unspecified: Secondary | ICD-10-CM | POA: Diagnosis not present

## 2019-02-15 DIAGNOSIS — E1029 Type 1 diabetes mellitus with other diabetic kidney complication: Secondary | ICD-10-CM | POA: Diagnosis not present

## 2019-02-15 DIAGNOSIS — N186 End stage renal disease: Secondary | ICD-10-CM | POA: Diagnosis not present

## 2019-02-15 DIAGNOSIS — R739 Hyperglycemia, unspecified: Secondary | ICD-10-CM | POA: Diagnosis not present

## 2019-02-15 DIAGNOSIS — N2581 Secondary hyperparathyroidism of renal origin: Secondary | ICD-10-CM | POA: Diagnosis not present

## 2019-02-15 DIAGNOSIS — D631 Anemia in chronic kidney disease: Secondary | ICD-10-CM | POA: Diagnosis not present

## 2019-02-17 DIAGNOSIS — N2581 Secondary hyperparathyroidism of renal origin: Secondary | ICD-10-CM | POA: Diagnosis not present

## 2019-02-17 DIAGNOSIS — N186 End stage renal disease: Secondary | ICD-10-CM | POA: Diagnosis not present

## 2019-02-17 DIAGNOSIS — E1029 Type 1 diabetes mellitus with other diabetic kidney complication: Secondary | ICD-10-CM | POA: Diagnosis not present

## 2019-02-17 DIAGNOSIS — D631 Anemia in chronic kidney disease: Secondary | ICD-10-CM | POA: Diagnosis not present

## 2019-02-17 DIAGNOSIS — R739 Hyperglycemia, unspecified: Secondary | ICD-10-CM | POA: Diagnosis not present

## 2019-02-17 DIAGNOSIS — D509 Iron deficiency anemia, unspecified: Secondary | ICD-10-CM | POA: Diagnosis not present

## 2019-02-19 DIAGNOSIS — D631 Anemia in chronic kidney disease: Secondary | ICD-10-CM | POA: Diagnosis not present

## 2019-02-19 DIAGNOSIS — E1029 Type 1 diabetes mellitus with other diabetic kidney complication: Secondary | ICD-10-CM | POA: Diagnosis not present

## 2019-02-19 DIAGNOSIS — Z992 Dependence on renal dialysis: Secondary | ICD-10-CM | POA: Diagnosis not present

## 2019-02-19 DIAGNOSIS — N186 End stage renal disease: Secondary | ICD-10-CM | POA: Diagnosis not present

## 2019-02-19 DIAGNOSIS — D509 Iron deficiency anemia, unspecified: Secondary | ICD-10-CM | POA: Diagnosis not present

## 2019-02-19 DIAGNOSIS — N2581 Secondary hyperparathyroidism of renal origin: Secondary | ICD-10-CM | POA: Diagnosis not present

## 2019-02-22 DIAGNOSIS — N2581 Secondary hyperparathyroidism of renal origin: Secondary | ICD-10-CM | POA: Diagnosis not present

## 2019-02-22 DIAGNOSIS — D631 Anemia in chronic kidney disease: Secondary | ICD-10-CM | POA: Diagnosis not present

## 2019-02-22 DIAGNOSIS — E1029 Type 1 diabetes mellitus with other diabetic kidney complication: Secondary | ICD-10-CM | POA: Diagnosis not present

## 2019-02-22 DIAGNOSIS — N186 End stage renal disease: Secondary | ICD-10-CM | POA: Diagnosis not present

## 2019-02-22 DIAGNOSIS — D509 Iron deficiency anemia, unspecified: Secondary | ICD-10-CM | POA: Diagnosis not present

## 2019-02-22 DIAGNOSIS — Z992 Dependence on renal dialysis: Secondary | ICD-10-CM | POA: Diagnosis not present

## 2019-02-24 DIAGNOSIS — N2581 Secondary hyperparathyroidism of renal origin: Secondary | ICD-10-CM | POA: Diagnosis not present

## 2019-02-24 DIAGNOSIS — N186 End stage renal disease: Secondary | ICD-10-CM | POA: Diagnosis not present

## 2019-02-24 DIAGNOSIS — D631 Anemia in chronic kidney disease: Secondary | ICD-10-CM | POA: Diagnosis not present

## 2019-02-24 DIAGNOSIS — D509 Iron deficiency anemia, unspecified: Secondary | ICD-10-CM | POA: Diagnosis not present

## 2019-02-24 DIAGNOSIS — Z992 Dependence on renal dialysis: Secondary | ICD-10-CM | POA: Diagnosis not present

## 2019-02-24 DIAGNOSIS — E1029 Type 1 diabetes mellitus with other diabetic kidney complication: Secondary | ICD-10-CM | POA: Diagnosis not present

## 2019-02-26 DIAGNOSIS — Z992 Dependence on renal dialysis: Secondary | ICD-10-CM | POA: Diagnosis not present

## 2019-02-26 DIAGNOSIS — N186 End stage renal disease: Secondary | ICD-10-CM | POA: Diagnosis not present

## 2019-02-26 DIAGNOSIS — N2581 Secondary hyperparathyroidism of renal origin: Secondary | ICD-10-CM | POA: Diagnosis not present

## 2019-02-26 DIAGNOSIS — D509 Iron deficiency anemia, unspecified: Secondary | ICD-10-CM | POA: Diagnosis not present

## 2019-02-26 DIAGNOSIS — E1029 Type 1 diabetes mellitus with other diabetic kidney complication: Secondary | ICD-10-CM | POA: Diagnosis not present

## 2019-02-26 DIAGNOSIS — D631 Anemia in chronic kidney disease: Secondary | ICD-10-CM | POA: Diagnosis not present

## 2019-03-01 DIAGNOSIS — N2581 Secondary hyperparathyroidism of renal origin: Secondary | ICD-10-CM | POA: Diagnosis not present

## 2019-03-01 DIAGNOSIS — N186 End stage renal disease: Secondary | ICD-10-CM | POA: Diagnosis not present

## 2019-03-01 DIAGNOSIS — D509 Iron deficiency anemia, unspecified: Secondary | ICD-10-CM | POA: Diagnosis not present

## 2019-03-01 DIAGNOSIS — Z992 Dependence on renal dialysis: Secondary | ICD-10-CM | POA: Diagnosis not present

## 2019-03-01 DIAGNOSIS — D631 Anemia in chronic kidney disease: Secondary | ICD-10-CM | POA: Diagnosis not present

## 2019-03-01 DIAGNOSIS — E1029 Type 1 diabetes mellitus with other diabetic kidney complication: Secondary | ICD-10-CM | POA: Diagnosis not present

## 2019-03-02 DIAGNOSIS — E103593 Type 1 diabetes mellitus with proliferative diabetic retinopathy without macular edema, bilateral: Secondary | ICD-10-CM | POA: Diagnosis not present

## 2019-03-02 DIAGNOSIS — E1022 Type 1 diabetes mellitus with diabetic chronic kidney disease: Secondary | ICD-10-CM | POA: Diagnosis not present

## 2019-03-02 DIAGNOSIS — N186 End stage renal disease: Secondary | ICD-10-CM | POA: Diagnosis not present

## 2019-03-02 DIAGNOSIS — Z794 Long term (current) use of insulin: Secondary | ICD-10-CM | POA: Diagnosis not present

## 2019-03-02 DIAGNOSIS — Z992 Dependence on renal dialysis: Secondary | ICD-10-CM | POA: Diagnosis not present

## 2019-03-03 DIAGNOSIS — D509 Iron deficiency anemia, unspecified: Secondary | ICD-10-CM | POA: Diagnosis not present

## 2019-03-03 DIAGNOSIS — N186 End stage renal disease: Secondary | ICD-10-CM | POA: Diagnosis not present

## 2019-03-03 DIAGNOSIS — D631 Anemia in chronic kidney disease: Secondary | ICD-10-CM | POA: Diagnosis not present

## 2019-03-03 DIAGNOSIS — N2581 Secondary hyperparathyroidism of renal origin: Secondary | ICD-10-CM | POA: Diagnosis not present

## 2019-03-03 DIAGNOSIS — Z992 Dependence on renal dialysis: Secondary | ICD-10-CM | POA: Diagnosis not present

## 2019-03-03 DIAGNOSIS — E1029 Type 1 diabetes mellitus with other diabetic kidney complication: Secondary | ICD-10-CM | POA: Diagnosis not present

## 2019-03-05 DIAGNOSIS — Z992 Dependence on renal dialysis: Secondary | ICD-10-CM | POA: Diagnosis not present

## 2019-03-05 DIAGNOSIS — N186 End stage renal disease: Secondary | ICD-10-CM | POA: Diagnosis not present

## 2019-03-05 DIAGNOSIS — D631 Anemia in chronic kidney disease: Secondary | ICD-10-CM | POA: Diagnosis not present

## 2019-03-05 DIAGNOSIS — E1029 Type 1 diabetes mellitus with other diabetic kidney complication: Secondary | ICD-10-CM | POA: Diagnosis not present

## 2019-03-05 DIAGNOSIS — D509 Iron deficiency anemia, unspecified: Secondary | ICD-10-CM | POA: Diagnosis not present

## 2019-03-05 DIAGNOSIS — N2581 Secondary hyperparathyroidism of renal origin: Secondary | ICD-10-CM | POA: Diagnosis not present

## 2019-03-08 DIAGNOSIS — Z992 Dependence on renal dialysis: Secondary | ICD-10-CM | POA: Diagnosis not present

## 2019-03-08 DIAGNOSIS — D509 Iron deficiency anemia, unspecified: Secondary | ICD-10-CM | POA: Diagnosis not present

## 2019-03-08 DIAGNOSIS — E1029 Type 1 diabetes mellitus with other diabetic kidney complication: Secondary | ICD-10-CM | POA: Diagnosis not present

## 2019-03-08 DIAGNOSIS — N186 End stage renal disease: Secondary | ICD-10-CM | POA: Diagnosis not present

## 2019-03-08 DIAGNOSIS — N2581 Secondary hyperparathyroidism of renal origin: Secondary | ICD-10-CM | POA: Diagnosis not present

## 2019-03-08 DIAGNOSIS — D631 Anemia in chronic kidney disease: Secondary | ICD-10-CM | POA: Diagnosis not present

## 2019-03-10 DIAGNOSIS — D509 Iron deficiency anemia, unspecified: Secondary | ICD-10-CM | POA: Diagnosis not present

## 2019-03-10 DIAGNOSIS — Z992 Dependence on renal dialysis: Secondary | ICD-10-CM | POA: Diagnosis not present

## 2019-03-10 DIAGNOSIS — E1029 Type 1 diabetes mellitus with other diabetic kidney complication: Secondary | ICD-10-CM | POA: Diagnosis not present

## 2019-03-10 DIAGNOSIS — N2581 Secondary hyperparathyroidism of renal origin: Secondary | ICD-10-CM | POA: Diagnosis not present

## 2019-03-10 DIAGNOSIS — N186 End stage renal disease: Secondary | ICD-10-CM | POA: Diagnosis not present

## 2019-03-10 DIAGNOSIS — D631 Anemia in chronic kidney disease: Secondary | ICD-10-CM | POA: Diagnosis not present

## 2019-03-12 DIAGNOSIS — D509 Iron deficiency anemia, unspecified: Secondary | ICD-10-CM | POA: Diagnosis not present

## 2019-03-12 DIAGNOSIS — N2581 Secondary hyperparathyroidism of renal origin: Secondary | ICD-10-CM | POA: Diagnosis not present

## 2019-03-12 DIAGNOSIS — D631 Anemia in chronic kidney disease: Secondary | ICD-10-CM | POA: Diagnosis not present

## 2019-03-12 DIAGNOSIS — E1029 Type 1 diabetes mellitus with other diabetic kidney complication: Secondary | ICD-10-CM | POA: Diagnosis not present

## 2019-03-12 DIAGNOSIS — N186 End stage renal disease: Secondary | ICD-10-CM | POA: Diagnosis not present

## 2019-03-12 DIAGNOSIS — Z992 Dependence on renal dialysis: Secondary | ICD-10-CM | POA: Diagnosis not present

## 2019-03-14 DIAGNOSIS — M89372 Hypertrophy of bone, left ankle and foot: Secondary | ICD-10-CM | POA: Diagnosis not present

## 2019-03-14 DIAGNOSIS — M89371 Hypertrophy of bone, right ankle and foot: Secondary | ICD-10-CM | POA: Diagnosis not present

## 2019-03-14 DIAGNOSIS — E119 Type 2 diabetes mellitus without complications: Secondary | ICD-10-CM | POA: Diagnosis not present

## 2019-03-14 DIAGNOSIS — L84 Corns and callosities: Secondary | ICD-10-CM | POA: Diagnosis not present

## 2019-03-15 DIAGNOSIS — D631 Anemia in chronic kidney disease: Secondary | ICD-10-CM | POA: Diagnosis not present

## 2019-03-15 DIAGNOSIS — Z992 Dependence on renal dialysis: Secondary | ICD-10-CM | POA: Diagnosis not present

## 2019-03-15 DIAGNOSIS — D509 Iron deficiency anemia, unspecified: Secondary | ICD-10-CM | POA: Diagnosis not present

## 2019-03-15 DIAGNOSIS — N186 End stage renal disease: Secondary | ICD-10-CM | POA: Diagnosis not present

## 2019-03-15 DIAGNOSIS — N2581 Secondary hyperparathyroidism of renal origin: Secondary | ICD-10-CM | POA: Diagnosis not present

## 2019-03-15 DIAGNOSIS — E1029 Type 1 diabetes mellitus with other diabetic kidney complication: Secondary | ICD-10-CM | POA: Diagnosis not present

## 2019-03-16 DIAGNOSIS — Z01021 Encounter for examination of eyes and vision following failed vision screening with abnormal findings: Secondary | ICD-10-CM | POA: Diagnosis not present

## 2019-03-16 DIAGNOSIS — Z5181 Encounter for therapeutic drug level monitoring: Secondary | ICD-10-CM | POA: Diagnosis not present

## 2019-03-16 DIAGNOSIS — E538 Deficiency of other specified B group vitamins: Secondary | ICD-10-CM | POA: Diagnosis not present

## 2019-03-16 DIAGNOSIS — Z1389 Encounter for screening for other disorder: Secondary | ICD-10-CM | POA: Diagnosis not present

## 2019-03-16 DIAGNOSIS — E782 Mixed hyperlipidemia: Secondary | ICD-10-CM | POA: Diagnosis not present

## 2019-03-16 DIAGNOSIS — I10 Essential (primary) hypertension: Secondary | ICD-10-CM | POA: Diagnosis not present

## 2019-03-16 DIAGNOSIS — Z992 Dependence on renal dialysis: Secondary | ICD-10-CM | POA: Diagnosis not present

## 2019-03-16 DIAGNOSIS — N186 End stage renal disease: Secondary | ICD-10-CM | POA: Diagnosis not present

## 2019-03-16 DIAGNOSIS — E1165 Type 2 diabetes mellitus with hyperglycemia: Secondary | ICD-10-CM | POA: Diagnosis not present

## 2019-03-16 DIAGNOSIS — Z0001 Encounter for general adult medical examination with abnormal findings: Secondary | ICD-10-CM | POA: Diagnosis not present

## 2019-03-16 DIAGNOSIS — D638 Anemia in other chronic diseases classified elsewhere: Secondary | ICD-10-CM | POA: Diagnosis not present

## 2019-03-16 DIAGNOSIS — I119 Hypertensive heart disease without heart failure: Secondary | ICD-10-CM | POA: Diagnosis not present

## 2019-03-16 DIAGNOSIS — Z01118 Encounter for examination of ears and hearing with other abnormal findings: Secondary | ICD-10-CM | POA: Diagnosis not present

## 2019-03-16 DIAGNOSIS — Z131 Encounter for screening for diabetes mellitus: Secondary | ICD-10-CM | POA: Diagnosis not present

## 2019-03-16 DIAGNOSIS — Z1329 Encounter for screening for other suspected endocrine disorder: Secondary | ICD-10-CM | POA: Diagnosis not present

## 2019-03-17 DIAGNOSIS — D509 Iron deficiency anemia, unspecified: Secondary | ICD-10-CM | POA: Diagnosis not present

## 2019-03-17 DIAGNOSIS — N186 End stage renal disease: Secondary | ICD-10-CM | POA: Diagnosis not present

## 2019-03-17 DIAGNOSIS — D631 Anemia in chronic kidney disease: Secondary | ICD-10-CM | POA: Diagnosis not present

## 2019-03-17 DIAGNOSIS — Z992 Dependence on renal dialysis: Secondary | ICD-10-CM | POA: Diagnosis not present

## 2019-03-17 DIAGNOSIS — E1029 Type 1 diabetes mellitus with other diabetic kidney complication: Secondary | ICD-10-CM | POA: Diagnosis not present

## 2019-03-17 DIAGNOSIS — N2581 Secondary hyperparathyroidism of renal origin: Secondary | ICD-10-CM | POA: Diagnosis not present

## 2019-03-19 DIAGNOSIS — E1029 Type 1 diabetes mellitus with other diabetic kidney complication: Secondary | ICD-10-CM | POA: Diagnosis not present

## 2019-03-19 DIAGNOSIS — Z992 Dependence on renal dialysis: Secondary | ICD-10-CM | POA: Diagnosis not present

## 2019-03-19 DIAGNOSIS — D509 Iron deficiency anemia, unspecified: Secondary | ICD-10-CM | POA: Diagnosis not present

## 2019-03-19 DIAGNOSIS — N2581 Secondary hyperparathyroidism of renal origin: Secondary | ICD-10-CM | POA: Diagnosis not present

## 2019-03-19 DIAGNOSIS — D631 Anemia in chronic kidney disease: Secondary | ICD-10-CM | POA: Diagnosis not present

## 2019-03-19 DIAGNOSIS — N186 End stage renal disease: Secondary | ICD-10-CM | POA: Diagnosis not present

## 2019-03-22 DIAGNOSIS — D509 Iron deficiency anemia, unspecified: Secondary | ICD-10-CM | POA: Diagnosis not present

## 2019-03-22 DIAGNOSIS — D631 Anemia in chronic kidney disease: Secondary | ICD-10-CM | POA: Diagnosis not present

## 2019-03-22 DIAGNOSIS — N2581 Secondary hyperparathyroidism of renal origin: Secondary | ICD-10-CM | POA: Diagnosis not present

## 2019-03-22 DIAGNOSIS — E1029 Type 1 diabetes mellitus with other diabetic kidney complication: Secondary | ICD-10-CM | POA: Diagnosis not present

## 2019-03-22 DIAGNOSIS — N186 End stage renal disease: Secondary | ICD-10-CM | POA: Diagnosis not present

## 2019-03-22 DIAGNOSIS — Z23 Encounter for immunization: Secondary | ICD-10-CM | POA: Diagnosis not present

## 2019-03-22 DIAGNOSIS — R739 Hyperglycemia, unspecified: Secondary | ICD-10-CM | POA: Diagnosis not present

## 2019-03-22 DIAGNOSIS — Z992 Dependence on renal dialysis: Secondary | ICD-10-CM | POA: Diagnosis not present

## 2019-03-24 DIAGNOSIS — D509 Iron deficiency anemia, unspecified: Secondary | ICD-10-CM | POA: Diagnosis not present

## 2019-03-24 DIAGNOSIS — Z992 Dependence on renal dialysis: Secondary | ICD-10-CM | POA: Diagnosis not present

## 2019-03-24 DIAGNOSIS — N2581 Secondary hyperparathyroidism of renal origin: Secondary | ICD-10-CM | POA: Diagnosis not present

## 2019-03-24 DIAGNOSIS — R739 Hyperglycemia, unspecified: Secondary | ICD-10-CM | POA: Diagnosis not present

## 2019-03-24 DIAGNOSIS — N186 End stage renal disease: Secondary | ICD-10-CM | POA: Diagnosis not present

## 2019-03-24 DIAGNOSIS — E1029 Type 1 diabetes mellitus with other diabetic kidney complication: Secondary | ICD-10-CM | POA: Diagnosis not present

## 2019-03-26 DIAGNOSIS — Z992 Dependence on renal dialysis: Secondary | ICD-10-CM | POA: Diagnosis not present

## 2019-03-26 DIAGNOSIS — E1029 Type 1 diabetes mellitus with other diabetic kidney complication: Secondary | ICD-10-CM | POA: Diagnosis not present

## 2019-03-26 DIAGNOSIS — N186 End stage renal disease: Secondary | ICD-10-CM | POA: Diagnosis not present

## 2019-03-26 DIAGNOSIS — R739 Hyperglycemia, unspecified: Secondary | ICD-10-CM | POA: Diagnosis not present

## 2019-03-26 DIAGNOSIS — N2581 Secondary hyperparathyroidism of renal origin: Secondary | ICD-10-CM | POA: Diagnosis not present

## 2019-03-26 DIAGNOSIS — D509 Iron deficiency anemia, unspecified: Secondary | ICD-10-CM | POA: Diagnosis not present

## 2019-03-29 DIAGNOSIS — D509 Iron deficiency anemia, unspecified: Secondary | ICD-10-CM | POA: Diagnosis not present

## 2019-03-29 DIAGNOSIS — N2581 Secondary hyperparathyroidism of renal origin: Secondary | ICD-10-CM | POA: Diagnosis not present

## 2019-03-29 DIAGNOSIS — E1029 Type 1 diabetes mellitus with other diabetic kidney complication: Secondary | ICD-10-CM | POA: Diagnosis not present

## 2019-03-29 DIAGNOSIS — N186 End stage renal disease: Secondary | ICD-10-CM | POA: Diagnosis not present

## 2019-03-29 DIAGNOSIS — Z992 Dependence on renal dialysis: Secondary | ICD-10-CM | POA: Diagnosis not present

## 2019-03-29 DIAGNOSIS — R739 Hyperglycemia, unspecified: Secondary | ICD-10-CM | POA: Diagnosis not present

## 2019-03-31 DIAGNOSIS — N186 End stage renal disease: Secondary | ICD-10-CM | POA: Diagnosis not present

## 2019-03-31 DIAGNOSIS — R739 Hyperglycemia, unspecified: Secondary | ICD-10-CM | POA: Diagnosis not present

## 2019-03-31 DIAGNOSIS — N2581 Secondary hyperparathyroidism of renal origin: Secondary | ICD-10-CM | POA: Diagnosis not present

## 2019-03-31 DIAGNOSIS — D509 Iron deficiency anemia, unspecified: Secondary | ICD-10-CM | POA: Diagnosis not present

## 2019-03-31 DIAGNOSIS — Z992 Dependence on renal dialysis: Secondary | ICD-10-CM | POA: Diagnosis not present

## 2019-03-31 DIAGNOSIS — E1029 Type 1 diabetes mellitus with other diabetic kidney complication: Secondary | ICD-10-CM | POA: Diagnosis not present

## 2019-04-02 DIAGNOSIS — D509 Iron deficiency anemia, unspecified: Secondary | ICD-10-CM | POA: Diagnosis not present

## 2019-04-02 DIAGNOSIS — N186 End stage renal disease: Secondary | ICD-10-CM | POA: Diagnosis not present

## 2019-04-02 DIAGNOSIS — N2581 Secondary hyperparathyroidism of renal origin: Secondary | ICD-10-CM | POA: Diagnosis not present

## 2019-04-02 DIAGNOSIS — Z992 Dependence on renal dialysis: Secondary | ICD-10-CM | POA: Diagnosis not present

## 2019-04-02 DIAGNOSIS — E1029 Type 1 diabetes mellitus with other diabetic kidney complication: Secondary | ICD-10-CM | POA: Diagnosis not present

## 2019-04-02 DIAGNOSIS — R739 Hyperglycemia, unspecified: Secondary | ICD-10-CM | POA: Diagnosis not present

## 2019-04-05 DIAGNOSIS — N186 End stage renal disease: Secondary | ICD-10-CM | POA: Diagnosis not present

## 2019-04-05 DIAGNOSIS — E1029 Type 1 diabetes mellitus with other diabetic kidney complication: Secondary | ICD-10-CM | POA: Diagnosis not present

## 2019-04-05 DIAGNOSIS — R739 Hyperglycemia, unspecified: Secondary | ICD-10-CM | POA: Diagnosis not present

## 2019-04-05 DIAGNOSIS — Z992 Dependence on renal dialysis: Secondary | ICD-10-CM | POA: Diagnosis not present

## 2019-04-05 DIAGNOSIS — N2581 Secondary hyperparathyroidism of renal origin: Secondary | ICD-10-CM | POA: Diagnosis not present

## 2019-04-05 DIAGNOSIS — D509 Iron deficiency anemia, unspecified: Secondary | ICD-10-CM | POA: Diagnosis not present

## 2019-04-07 DIAGNOSIS — E1029 Type 1 diabetes mellitus with other diabetic kidney complication: Secondary | ICD-10-CM | POA: Diagnosis not present

## 2019-04-07 DIAGNOSIS — N186 End stage renal disease: Secondary | ICD-10-CM | POA: Diagnosis not present

## 2019-04-07 DIAGNOSIS — D509 Iron deficiency anemia, unspecified: Secondary | ICD-10-CM | POA: Diagnosis not present

## 2019-04-07 DIAGNOSIS — N2581 Secondary hyperparathyroidism of renal origin: Secondary | ICD-10-CM | POA: Diagnosis not present

## 2019-04-07 DIAGNOSIS — R739 Hyperglycemia, unspecified: Secondary | ICD-10-CM | POA: Diagnosis not present

## 2019-04-07 DIAGNOSIS — Z992 Dependence on renal dialysis: Secondary | ICD-10-CM | POA: Diagnosis not present

## 2019-04-09 DIAGNOSIS — E1029 Type 1 diabetes mellitus with other diabetic kidney complication: Secondary | ICD-10-CM | POA: Diagnosis not present

## 2019-04-09 DIAGNOSIS — D509 Iron deficiency anemia, unspecified: Secondary | ICD-10-CM | POA: Diagnosis not present

## 2019-04-09 DIAGNOSIS — Z992 Dependence on renal dialysis: Secondary | ICD-10-CM | POA: Diagnosis not present

## 2019-04-09 DIAGNOSIS — N186 End stage renal disease: Secondary | ICD-10-CM | POA: Diagnosis not present

## 2019-04-09 DIAGNOSIS — R739 Hyperglycemia, unspecified: Secondary | ICD-10-CM | POA: Diagnosis not present

## 2019-04-09 DIAGNOSIS — N2581 Secondary hyperparathyroidism of renal origin: Secondary | ICD-10-CM | POA: Diagnosis not present

## 2019-04-12 DIAGNOSIS — Z992 Dependence on renal dialysis: Secondary | ICD-10-CM | POA: Diagnosis not present

## 2019-04-12 DIAGNOSIS — R739 Hyperglycemia, unspecified: Secondary | ICD-10-CM | POA: Diagnosis not present

## 2019-04-12 DIAGNOSIS — N2581 Secondary hyperparathyroidism of renal origin: Secondary | ICD-10-CM | POA: Diagnosis not present

## 2019-04-12 DIAGNOSIS — D509 Iron deficiency anemia, unspecified: Secondary | ICD-10-CM | POA: Diagnosis not present

## 2019-04-12 DIAGNOSIS — N186 End stage renal disease: Secondary | ICD-10-CM | POA: Diagnosis not present

## 2019-04-12 DIAGNOSIS — E1029 Type 1 diabetes mellitus with other diabetic kidney complication: Secondary | ICD-10-CM | POA: Diagnosis not present

## 2019-04-14 DIAGNOSIS — E1029 Type 1 diabetes mellitus with other diabetic kidney complication: Secondary | ICD-10-CM | POA: Diagnosis not present

## 2019-04-14 DIAGNOSIS — N2581 Secondary hyperparathyroidism of renal origin: Secondary | ICD-10-CM | POA: Diagnosis not present

## 2019-04-14 DIAGNOSIS — D509 Iron deficiency anemia, unspecified: Secondary | ICD-10-CM | POA: Diagnosis not present

## 2019-04-14 DIAGNOSIS — N186 End stage renal disease: Secondary | ICD-10-CM | POA: Diagnosis not present

## 2019-04-14 DIAGNOSIS — Z992 Dependence on renal dialysis: Secondary | ICD-10-CM | POA: Diagnosis not present

## 2019-04-14 DIAGNOSIS — R739 Hyperglycemia, unspecified: Secondary | ICD-10-CM | POA: Diagnosis not present

## 2019-04-16 DIAGNOSIS — N186 End stage renal disease: Secondary | ICD-10-CM | POA: Diagnosis not present

## 2019-04-16 DIAGNOSIS — R739 Hyperglycemia, unspecified: Secondary | ICD-10-CM | POA: Diagnosis not present

## 2019-04-16 DIAGNOSIS — Z992 Dependence on renal dialysis: Secondary | ICD-10-CM | POA: Diagnosis not present

## 2019-04-16 DIAGNOSIS — E1029 Type 1 diabetes mellitus with other diabetic kidney complication: Secondary | ICD-10-CM | POA: Diagnosis not present

## 2019-04-16 DIAGNOSIS — D509 Iron deficiency anemia, unspecified: Secondary | ICD-10-CM | POA: Diagnosis not present

## 2019-04-16 DIAGNOSIS — N2581 Secondary hyperparathyroidism of renal origin: Secondary | ICD-10-CM | POA: Diagnosis not present

## 2019-04-18 DIAGNOSIS — I10 Essential (primary) hypertension: Secondary | ICD-10-CM | POA: Diagnosis not present

## 2019-04-18 DIAGNOSIS — I119 Hypertensive heart disease without heart failure: Secondary | ICD-10-CM | POA: Diagnosis not present

## 2019-04-18 DIAGNOSIS — E1165 Type 2 diabetes mellitus with hyperglycemia: Secondary | ICD-10-CM | POA: Diagnosis not present

## 2019-04-18 DIAGNOSIS — E782 Mixed hyperlipidemia: Secondary | ICD-10-CM | POA: Diagnosis not present

## 2019-04-18 DIAGNOSIS — D638 Anemia in other chronic diseases classified elsewhere: Secondary | ICD-10-CM | POA: Diagnosis not present

## 2019-04-18 DIAGNOSIS — E538 Deficiency of other specified B group vitamins: Secondary | ICD-10-CM | POA: Diagnosis not present

## 2019-04-18 DIAGNOSIS — Z789 Other specified health status: Secondary | ICD-10-CM | POA: Diagnosis not present

## 2019-04-19 DIAGNOSIS — N186 End stage renal disease: Secondary | ICD-10-CM | POA: Diagnosis not present

## 2019-04-19 DIAGNOSIS — R739 Hyperglycemia, unspecified: Secondary | ICD-10-CM | POA: Diagnosis not present

## 2019-04-19 DIAGNOSIS — Z992 Dependence on renal dialysis: Secondary | ICD-10-CM | POA: Diagnosis not present

## 2019-04-19 DIAGNOSIS — D509 Iron deficiency anemia, unspecified: Secondary | ICD-10-CM | POA: Diagnosis not present

## 2019-04-19 DIAGNOSIS — E1029 Type 1 diabetes mellitus with other diabetic kidney complication: Secondary | ICD-10-CM | POA: Diagnosis not present

## 2019-04-19 DIAGNOSIS — N2581 Secondary hyperparathyroidism of renal origin: Secondary | ICD-10-CM | POA: Diagnosis not present

## 2019-04-21 DIAGNOSIS — D509 Iron deficiency anemia, unspecified: Secondary | ICD-10-CM | POA: Diagnosis not present

## 2019-04-21 DIAGNOSIS — E1029 Type 1 diabetes mellitus with other diabetic kidney complication: Secondary | ICD-10-CM | POA: Diagnosis not present

## 2019-04-21 DIAGNOSIS — N186 End stage renal disease: Secondary | ICD-10-CM | POA: Diagnosis not present

## 2019-04-21 DIAGNOSIS — D631 Anemia in chronic kidney disease: Secondary | ICD-10-CM | POA: Diagnosis not present

## 2019-04-21 DIAGNOSIS — N2581 Secondary hyperparathyroidism of renal origin: Secondary | ICD-10-CM | POA: Diagnosis not present

## 2019-04-21 DIAGNOSIS — Z992 Dependence on renal dialysis: Secondary | ICD-10-CM | POA: Diagnosis not present

## 2019-04-21 DIAGNOSIS — R739 Hyperglycemia, unspecified: Secondary | ICD-10-CM | POA: Diagnosis not present

## 2019-04-23 DIAGNOSIS — N2581 Secondary hyperparathyroidism of renal origin: Secondary | ICD-10-CM | POA: Diagnosis not present

## 2019-04-23 DIAGNOSIS — N186 End stage renal disease: Secondary | ICD-10-CM | POA: Diagnosis not present

## 2019-04-23 DIAGNOSIS — E1029 Type 1 diabetes mellitus with other diabetic kidney complication: Secondary | ICD-10-CM | POA: Diagnosis not present

## 2019-04-23 DIAGNOSIS — D509 Iron deficiency anemia, unspecified: Secondary | ICD-10-CM | POA: Diagnosis not present

## 2019-04-23 DIAGNOSIS — R739 Hyperglycemia, unspecified: Secondary | ICD-10-CM | POA: Diagnosis not present

## 2019-04-23 DIAGNOSIS — Z992 Dependence on renal dialysis: Secondary | ICD-10-CM | POA: Diagnosis not present

## 2019-04-26 DIAGNOSIS — N2581 Secondary hyperparathyroidism of renal origin: Secondary | ICD-10-CM | POA: Diagnosis not present

## 2019-04-26 DIAGNOSIS — Z992 Dependence on renal dialysis: Secondary | ICD-10-CM | POA: Diagnosis not present

## 2019-04-26 DIAGNOSIS — N186 End stage renal disease: Secondary | ICD-10-CM | POA: Diagnosis not present

## 2019-04-26 DIAGNOSIS — R739 Hyperglycemia, unspecified: Secondary | ICD-10-CM | POA: Diagnosis not present

## 2019-04-26 DIAGNOSIS — E1029 Type 1 diabetes mellitus with other diabetic kidney complication: Secondary | ICD-10-CM | POA: Diagnosis not present

## 2019-04-26 DIAGNOSIS — D509 Iron deficiency anemia, unspecified: Secondary | ICD-10-CM | POA: Diagnosis not present

## 2019-04-28 DIAGNOSIS — N186 End stage renal disease: Secondary | ICD-10-CM | POA: Diagnosis not present

## 2019-04-28 DIAGNOSIS — E1029 Type 1 diabetes mellitus with other diabetic kidney complication: Secondary | ICD-10-CM | POA: Diagnosis not present

## 2019-04-28 DIAGNOSIS — N2581 Secondary hyperparathyroidism of renal origin: Secondary | ICD-10-CM | POA: Diagnosis not present

## 2019-04-28 DIAGNOSIS — R739 Hyperglycemia, unspecified: Secondary | ICD-10-CM | POA: Diagnosis not present

## 2019-04-28 DIAGNOSIS — Z992 Dependence on renal dialysis: Secondary | ICD-10-CM | POA: Diagnosis not present

## 2019-04-28 DIAGNOSIS — D509 Iron deficiency anemia, unspecified: Secondary | ICD-10-CM | POA: Diagnosis not present

## 2019-04-30 DIAGNOSIS — N186 End stage renal disease: Secondary | ICD-10-CM | POA: Diagnosis not present

## 2019-04-30 DIAGNOSIS — D509 Iron deficiency anemia, unspecified: Secondary | ICD-10-CM | POA: Diagnosis not present

## 2019-04-30 DIAGNOSIS — E1029 Type 1 diabetes mellitus with other diabetic kidney complication: Secondary | ICD-10-CM | POA: Diagnosis not present

## 2019-04-30 DIAGNOSIS — N2581 Secondary hyperparathyroidism of renal origin: Secondary | ICD-10-CM | POA: Diagnosis not present

## 2019-04-30 DIAGNOSIS — R739 Hyperglycemia, unspecified: Secondary | ICD-10-CM | POA: Diagnosis not present

## 2019-04-30 DIAGNOSIS — Z992 Dependence on renal dialysis: Secondary | ICD-10-CM | POA: Diagnosis not present

## 2019-05-03 DIAGNOSIS — E1029 Type 1 diabetes mellitus with other diabetic kidney complication: Secondary | ICD-10-CM | POA: Diagnosis not present

## 2019-05-03 DIAGNOSIS — N2581 Secondary hyperparathyroidism of renal origin: Secondary | ICD-10-CM | POA: Diagnosis not present

## 2019-05-03 DIAGNOSIS — N186 End stage renal disease: Secondary | ICD-10-CM | POA: Diagnosis not present

## 2019-05-03 DIAGNOSIS — Z992 Dependence on renal dialysis: Secondary | ICD-10-CM | POA: Diagnosis not present

## 2019-05-03 DIAGNOSIS — R739 Hyperglycemia, unspecified: Secondary | ICD-10-CM | POA: Diagnosis not present

## 2019-05-03 DIAGNOSIS — D509 Iron deficiency anemia, unspecified: Secondary | ICD-10-CM | POA: Diagnosis not present

## 2019-05-05 DIAGNOSIS — N2581 Secondary hyperparathyroidism of renal origin: Secondary | ICD-10-CM | POA: Diagnosis not present

## 2019-05-05 DIAGNOSIS — N186 End stage renal disease: Secondary | ICD-10-CM | POA: Diagnosis not present

## 2019-05-05 DIAGNOSIS — E1029 Type 1 diabetes mellitus with other diabetic kidney complication: Secondary | ICD-10-CM | POA: Diagnosis not present

## 2019-05-05 DIAGNOSIS — D509 Iron deficiency anemia, unspecified: Secondary | ICD-10-CM | POA: Diagnosis not present

## 2019-05-05 DIAGNOSIS — Z992 Dependence on renal dialysis: Secondary | ICD-10-CM | POA: Diagnosis not present

## 2019-05-05 DIAGNOSIS — R739 Hyperglycemia, unspecified: Secondary | ICD-10-CM | POA: Diagnosis not present

## 2019-05-07 DIAGNOSIS — N186 End stage renal disease: Secondary | ICD-10-CM | POA: Diagnosis not present

## 2019-05-07 DIAGNOSIS — N2581 Secondary hyperparathyroidism of renal origin: Secondary | ICD-10-CM | POA: Diagnosis not present

## 2019-05-07 DIAGNOSIS — R739 Hyperglycemia, unspecified: Secondary | ICD-10-CM | POA: Diagnosis not present

## 2019-05-07 DIAGNOSIS — D509 Iron deficiency anemia, unspecified: Secondary | ICD-10-CM | POA: Diagnosis not present

## 2019-05-07 DIAGNOSIS — Z992 Dependence on renal dialysis: Secondary | ICD-10-CM | POA: Diagnosis not present

## 2019-05-07 DIAGNOSIS — E1029 Type 1 diabetes mellitus with other diabetic kidney complication: Secondary | ICD-10-CM | POA: Diagnosis not present

## 2019-05-10 DIAGNOSIS — Z992 Dependence on renal dialysis: Secondary | ICD-10-CM | POA: Diagnosis not present

## 2019-05-10 DIAGNOSIS — R739 Hyperglycemia, unspecified: Secondary | ICD-10-CM | POA: Diagnosis not present

## 2019-05-10 DIAGNOSIS — N2581 Secondary hyperparathyroidism of renal origin: Secondary | ICD-10-CM | POA: Diagnosis not present

## 2019-05-10 DIAGNOSIS — E1029 Type 1 diabetes mellitus with other diabetic kidney complication: Secondary | ICD-10-CM | POA: Diagnosis not present

## 2019-05-10 DIAGNOSIS — D509 Iron deficiency anemia, unspecified: Secondary | ICD-10-CM | POA: Diagnosis not present

## 2019-05-10 DIAGNOSIS — N186 End stage renal disease: Secondary | ICD-10-CM | POA: Diagnosis not present

## 2019-05-12 DIAGNOSIS — R739 Hyperglycemia, unspecified: Secondary | ICD-10-CM | POA: Diagnosis not present

## 2019-05-12 DIAGNOSIS — D509 Iron deficiency anemia, unspecified: Secondary | ICD-10-CM | POA: Diagnosis not present

## 2019-05-12 DIAGNOSIS — N2581 Secondary hyperparathyroidism of renal origin: Secondary | ICD-10-CM | POA: Diagnosis not present

## 2019-05-12 DIAGNOSIS — E1029 Type 1 diabetes mellitus with other diabetic kidney complication: Secondary | ICD-10-CM | POA: Diagnosis not present

## 2019-05-12 DIAGNOSIS — N186 End stage renal disease: Secondary | ICD-10-CM | POA: Diagnosis not present

## 2019-05-12 DIAGNOSIS — Z992 Dependence on renal dialysis: Secondary | ICD-10-CM | POA: Diagnosis not present

## 2019-05-14 DIAGNOSIS — N2581 Secondary hyperparathyroidism of renal origin: Secondary | ICD-10-CM | POA: Diagnosis not present

## 2019-05-14 DIAGNOSIS — R739 Hyperglycemia, unspecified: Secondary | ICD-10-CM | POA: Diagnosis not present

## 2019-05-14 DIAGNOSIS — D509 Iron deficiency anemia, unspecified: Secondary | ICD-10-CM | POA: Diagnosis not present

## 2019-05-14 DIAGNOSIS — Z992 Dependence on renal dialysis: Secondary | ICD-10-CM | POA: Diagnosis not present

## 2019-05-14 DIAGNOSIS — E1029 Type 1 diabetes mellitus with other diabetic kidney complication: Secondary | ICD-10-CM | POA: Diagnosis not present

## 2019-05-14 DIAGNOSIS — N186 End stage renal disease: Secondary | ICD-10-CM | POA: Diagnosis not present

## 2019-05-17 DIAGNOSIS — D509 Iron deficiency anemia, unspecified: Secondary | ICD-10-CM | POA: Diagnosis not present

## 2019-05-17 DIAGNOSIS — N2581 Secondary hyperparathyroidism of renal origin: Secondary | ICD-10-CM | POA: Diagnosis not present

## 2019-05-17 DIAGNOSIS — E1029 Type 1 diabetes mellitus with other diabetic kidney complication: Secondary | ICD-10-CM | POA: Diagnosis not present

## 2019-05-17 DIAGNOSIS — N186 End stage renal disease: Secondary | ICD-10-CM | POA: Diagnosis not present

## 2019-05-17 DIAGNOSIS — R739 Hyperglycemia, unspecified: Secondary | ICD-10-CM | POA: Diagnosis not present

## 2019-05-17 DIAGNOSIS — Z992 Dependence on renal dialysis: Secondary | ICD-10-CM | POA: Diagnosis not present

## 2019-05-19 DIAGNOSIS — N186 End stage renal disease: Secondary | ICD-10-CM | POA: Diagnosis not present

## 2019-05-19 DIAGNOSIS — E1029 Type 1 diabetes mellitus with other diabetic kidney complication: Secondary | ICD-10-CM | POA: Diagnosis not present

## 2019-05-19 DIAGNOSIS — Z992 Dependence on renal dialysis: Secondary | ICD-10-CM | POA: Diagnosis not present

## 2019-05-19 DIAGNOSIS — N2581 Secondary hyperparathyroidism of renal origin: Secondary | ICD-10-CM | POA: Diagnosis not present

## 2019-05-19 DIAGNOSIS — R739 Hyperglycemia, unspecified: Secondary | ICD-10-CM | POA: Diagnosis not present

## 2019-05-19 DIAGNOSIS — D509 Iron deficiency anemia, unspecified: Secondary | ICD-10-CM | POA: Diagnosis not present

## 2019-05-21 DIAGNOSIS — Z992 Dependence on renal dialysis: Secondary | ICD-10-CM | POA: Diagnosis not present

## 2019-05-21 DIAGNOSIS — D509 Iron deficiency anemia, unspecified: Secondary | ICD-10-CM | POA: Diagnosis not present

## 2019-05-21 DIAGNOSIS — R739 Hyperglycemia, unspecified: Secondary | ICD-10-CM | POA: Diagnosis not present

## 2019-05-21 DIAGNOSIS — E1029 Type 1 diabetes mellitus with other diabetic kidney complication: Secondary | ICD-10-CM | POA: Diagnosis not present

## 2019-05-21 DIAGNOSIS — N2581 Secondary hyperparathyroidism of renal origin: Secondary | ICD-10-CM | POA: Diagnosis not present

## 2019-05-21 DIAGNOSIS — N186 End stage renal disease: Secondary | ICD-10-CM | POA: Diagnosis not present

## 2019-05-22 DIAGNOSIS — N186 End stage renal disease: Secondary | ICD-10-CM | POA: Diagnosis not present

## 2019-05-22 DIAGNOSIS — E1029 Type 1 diabetes mellitus with other diabetic kidney complication: Secondary | ICD-10-CM | POA: Diagnosis not present

## 2019-05-22 DIAGNOSIS — Z992 Dependence on renal dialysis: Secondary | ICD-10-CM | POA: Diagnosis not present

## 2019-05-24 DIAGNOSIS — N186 End stage renal disease: Secondary | ICD-10-CM | POA: Diagnosis not present

## 2019-05-24 DIAGNOSIS — D631 Anemia in chronic kidney disease: Secondary | ICD-10-CM | POA: Diagnosis not present

## 2019-05-24 DIAGNOSIS — E1029 Type 1 diabetes mellitus with other diabetic kidney complication: Secondary | ICD-10-CM | POA: Diagnosis not present

## 2019-05-24 DIAGNOSIS — N2581 Secondary hyperparathyroidism of renal origin: Secondary | ICD-10-CM | POA: Diagnosis not present

## 2019-05-24 DIAGNOSIS — Z992 Dependence on renal dialysis: Secondary | ICD-10-CM | POA: Diagnosis not present

## 2019-05-26 DIAGNOSIS — E1029 Type 1 diabetes mellitus with other diabetic kidney complication: Secondary | ICD-10-CM | POA: Diagnosis not present

## 2019-05-26 DIAGNOSIS — D631 Anemia in chronic kidney disease: Secondary | ICD-10-CM | POA: Diagnosis not present

## 2019-05-26 DIAGNOSIS — Z992 Dependence on renal dialysis: Secondary | ICD-10-CM | POA: Diagnosis not present

## 2019-05-26 DIAGNOSIS — N186 End stage renal disease: Secondary | ICD-10-CM | POA: Diagnosis not present

## 2019-05-26 DIAGNOSIS — N2581 Secondary hyperparathyroidism of renal origin: Secondary | ICD-10-CM | POA: Diagnosis not present

## 2019-05-28 DIAGNOSIS — N186 End stage renal disease: Secondary | ICD-10-CM | POA: Diagnosis not present

## 2019-05-28 DIAGNOSIS — D631 Anemia in chronic kidney disease: Secondary | ICD-10-CM | POA: Diagnosis not present

## 2019-05-28 DIAGNOSIS — E1029 Type 1 diabetes mellitus with other diabetic kidney complication: Secondary | ICD-10-CM | POA: Diagnosis not present

## 2019-05-28 DIAGNOSIS — Z992 Dependence on renal dialysis: Secondary | ICD-10-CM | POA: Diagnosis not present

## 2019-05-28 DIAGNOSIS — N2581 Secondary hyperparathyroidism of renal origin: Secondary | ICD-10-CM | POA: Diagnosis not present

## 2019-05-31 DIAGNOSIS — N186 End stage renal disease: Secondary | ICD-10-CM | POA: Diagnosis not present

## 2019-05-31 DIAGNOSIS — N2581 Secondary hyperparathyroidism of renal origin: Secondary | ICD-10-CM | POA: Diagnosis not present

## 2019-05-31 DIAGNOSIS — D631 Anemia in chronic kidney disease: Secondary | ICD-10-CM | POA: Diagnosis not present

## 2019-05-31 DIAGNOSIS — E1029 Type 1 diabetes mellitus with other diabetic kidney complication: Secondary | ICD-10-CM | POA: Diagnosis not present

## 2019-05-31 DIAGNOSIS — Z992 Dependence on renal dialysis: Secondary | ICD-10-CM | POA: Diagnosis not present

## 2019-06-02 DIAGNOSIS — E1029 Type 1 diabetes mellitus with other diabetic kidney complication: Secondary | ICD-10-CM | POA: Diagnosis not present

## 2019-06-02 DIAGNOSIS — Z992 Dependence on renal dialysis: Secondary | ICD-10-CM | POA: Diagnosis not present

## 2019-06-02 DIAGNOSIS — D631 Anemia in chronic kidney disease: Secondary | ICD-10-CM | POA: Diagnosis not present

## 2019-06-02 DIAGNOSIS — N2581 Secondary hyperparathyroidism of renal origin: Secondary | ICD-10-CM | POA: Diagnosis not present

## 2019-06-02 DIAGNOSIS — N186 End stage renal disease: Secondary | ICD-10-CM | POA: Diagnosis not present

## 2019-06-03 DIAGNOSIS — N185 Chronic kidney disease, stage 5: Secondary | ICD-10-CM | POA: Diagnosis not present

## 2019-06-03 DIAGNOSIS — Z794 Long term (current) use of insulin: Secondary | ICD-10-CM | POA: Diagnosis not present

## 2019-06-03 DIAGNOSIS — E1022 Type 1 diabetes mellitus with diabetic chronic kidney disease: Secondary | ICD-10-CM | POA: Diagnosis not present

## 2019-06-03 DIAGNOSIS — Z992 Dependence on renal dialysis: Secondary | ICD-10-CM | POA: Diagnosis not present

## 2019-06-03 DIAGNOSIS — E10319 Type 1 diabetes mellitus with unspecified diabetic retinopathy without macular edema: Secondary | ICD-10-CM | POA: Diagnosis not present

## 2019-06-04 DIAGNOSIS — D631 Anemia in chronic kidney disease: Secondary | ICD-10-CM | POA: Diagnosis not present

## 2019-06-04 DIAGNOSIS — E1029 Type 1 diabetes mellitus with other diabetic kidney complication: Secondary | ICD-10-CM | POA: Diagnosis not present

## 2019-06-04 DIAGNOSIS — Z992 Dependence on renal dialysis: Secondary | ICD-10-CM | POA: Diagnosis not present

## 2019-06-04 DIAGNOSIS — N186 End stage renal disease: Secondary | ICD-10-CM | POA: Diagnosis not present

## 2019-06-04 DIAGNOSIS — N2581 Secondary hyperparathyroidism of renal origin: Secondary | ICD-10-CM | POA: Diagnosis not present

## 2019-06-07 DIAGNOSIS — Z992 Dependence on renal dialysis: Secondary | ICD-10-CM | POA: Diagnosis not present

## 2019-06-07 DIAGNOSIS — D631 Anemia in chronic kidney disease: Secondary | ICD-10-CM | POA: Diagnosis not present

## 2019-06-07 DIAGNOSIS — N186 End stage renal disease: Secondary | ICD-10-CM | POA: Diagnosis not present

## 2019-06-07 DIAGNOSIS — N2581 Secondary hyperparathyroidism of renal origin: Secondary | ICD-10-CM | POA: Diagnosis not present

## 2019-06-07 DIAGNOSIS — E1029 Type 1 diabetes mellitus with other diabetic kidney complication: Secondary | ICD-10-CM | POA: Diagnosis not present

## 2019-06-09 DIAGNOSIS — N186 End stage renal disease: Secondary | ICD-10-CM | POA: Diagnosis not present

## 2019-06-09 DIAGNOSIS — E1029 Type 1 diabetes mellitus with other diabetic kidney complication: Secondary | ICD-10-CM | POA: Diagnosis not present

## 2019-06-09 DIAGNOSIS — Z992 Dependence on renal dialysis: Secondary | ICD-10-CM | POA: Diagnosis not present

## 2019-06-09 DIAGNOSIS — N2581 Secondary hyperparathyroidism of renal origin: Secondary | ICD-10-CM | POA: Diagnosis not present

## 2019-06-09 DIAGNOSIS — D631 Anemia in chronic kidney disease: Secondary | ICD-10-CM | POA: Diagnosis not present

## 2019-06-11 DIAGNOSIS — N186 End stage renal disease: Secondary | ICD-10-CM | POA: Diagnosis not present

## 2019-06-11 DIAGNOSIS — D631 Anemia in chronic kidney disease: Secondary | ICD-10-CM | POA: Diagnosis not present

## 2019-06-11 DIAGNOSIS — Z992 Dependence on renal dialysis: Secondary | ICD-10-CM | POA: Diagnosis not present

## 2019-06-11 DIAGNOSIS — E1029 Type 1 diabetes mellitus with other diabetic kidney complication: Secondary | ICD-10-CM | POA: Diagnosis not present

## 2019-06-11 DIAGNOSIS — N2581 Secondary hyperparathyroidism of renal origin: Secondary | ICD-10-CM | POA: Diagnosis not present

## 2019-06-13 DIAGNOSIS — E1029 Type 1 diabetes mellitus with other diabetic kidney complication: Secondary | ICD-10-CM | POA: Diagnosis not present

## 2019-06-13 DIAGNOSIS — Z992 Dependence on renal dialysis: Secondary | ICD-10-CM | POA: Diagnosis not present

## 2019-06-13 DIAGNOSIS — N2581 Secondary hyperparathyroidism of renal origin: Secondary | ICD-10-CM | POA: Diagnosis not present

## 2019-06-13 DIAGNOSIS — D631 Anemia in chronic kidney disease: Secondary | ICD-10-CM | POA: Diagnosis not present

## 2019-06-13 DIAGNOSIS — N186 End stage renal disease: Secondary | ICD-10-CM | POA: Diagnosis not present

## 2019-06-15 DIAGNOSIS — D631 Anemia in chronic kidney disease: Secondary | ICD-10-CM | POA: Diagnosis not present

## 2019-06-15 DIAGNOSIS — E1029 Type 1 diabetes mellitus with other diabetic kidney complication: Secondary | ICD-10-CM | POA: Diagnosis not present

## 2019-06-15 DIAGNOSIS — N2581 Secondary hyperparathyroidism of renal origin: Secondary | ICD-10-CM | POA: Diagnosis not present

## 2019-06-15 DIAGNOSIS — N186 End stage renal disease: Secondary | ICD-10-CM | POA: Diagnosis not present

## 2019-06-15 DIAGNOSIS — Z992 Dependence on renal dialysis: Secondary | ICD-10-CM | POA: Diagnosis not present

## 2019-06-18 DIAGNOSIS — Z992 Dependence on renal dialysis: Secondary | ICD-10-CM | POA: Diagnosis not present

## 2019-06-18 DIAGNOSIS — D631 Anemia in chronic kidney disease: Secondary | ICD-10-CM | POA: Diagnosis not present

## 2019-06-18 DIAGNOSIS — N186 End stage renal disease: Secondary | ICD-10-CM | POA: Diagnosis not present

## 2019-06-18 DIAGNOSIS — N2581 Secondary hyperparathyroidism of renal origin: Secondary | ICD-10-CM | POA: Diagnosis not present

## 2019-06-18 DIAGNOSIS — E1029 Type 1 diabetes mellitus with other diabetic kidney complication: Secondary | ICD-10-CM | POA: Diagnosis not present

## 2019-07-24 DIAGNOSIS — D689 Coagulation defect, unspecified: Secondary | ICD-10-CM | POA: Diagnosis not present

## 2019-07-24 DIAGNOSIS — Z992 Dependence on renal dialysis: Secondary | ICD-10-CM | POA: Diagnosis not present

## 2019-07-24 DIAGNOSIS — D631 Anemia in chronic kidney disease: Secondary | ICD-10-CM | POA: Diagnosis not present

## 2019-07-24 DIAGNOSIS — N2581 Secondary hyperparathyroidism of renal origin: Secondary | ICD-10-CM | POA: Diagnosis not present

## 2019-07-24 DIAGNOSIS — N186 End stage renal disease: Secondary | ICD-10-CM | POA: Diagnosis not present

## 2019-07-24 DIAGNOSIS — E1029 Type 1 diabetes mellitus with other diabetic kidney complication: Secondary | ICD-10-CM | POA: Diagnosis not present

## 2019-07-26 DIAGNOSIS — N186 End stage renal disease: Secondary | ICD-10-CM | POA: Diagnosis not present

## 2019-07-26 DIAGNOSIS — D689 Coagulation defect, unspecified: Secondary | ICD-10-CM | POA: Diagnosis not present

## 2019-07-26 DIAGNOSIS — N2581 Secondary hyperparathyroidism of renal origin: Secondary | ICD-10-CM | POA: Diagnosis not present

## 2019-07-26 DIAGNOSIS — D631 Anemia in chronic kidney disease: Secondary | ICD-10-CM | POA: Diagnosis not present

## 2019-07-26 DIAGNOSIS — Z992 Dependence on renal dialysis: Secondary | ICD-10-CM | POA: Diagnosis not present

## 2019-07-26 DIAGNOSIS — E1029 Type 1 diabetes mellitus with other diabetic kidney complication: Secondary | ICD-10-CM | POA: Diagnosis not present

## 2019-07-28 DIAGNOSIS — D631 Anemia in chronic kidney disease: Secondary | ICD-10-CM | POA: Diagnosis not present

## 2019-07-28 DIAGNOSIS — E1029 Type 1 diabetes mellitus with other diabetic kidney complication: Secondary | ICD-10-CM | POA: Diagnosis not present

## 2019-07-28 DIAGNOSIS — N186 End stage renal disease: Secondary | ICD-10-CM | POA: Diagnosis not present

## 2019-07-28 DIAGNOSIS — D689 Coagulation defect, unspecified: Secondary | ICD-10-CM | POA: Diagnosis not present

## 2019-07-28 DIAGNOSIS — N2581 Secondary hyperparathyroidism of renal origin: Secondary | ICD-10-CM | POA: Diagnosis not present

## 2019-07-28 DIAGNOSIS — Z992 Dependence on renal dialysis: Secondary | ICD-10-CM | POA: Diagnosis not present

## 2019-07-30 DIAGNOSIS — D689 Coagulation defect, unspecified: Secondary | ICD-10-CM | POA: Diagnosis not present

## 2019-07-30 DIAGNOSIS — N2581 Secondary hyperparathyroidism of renal origin: Secondary | ICD-10-CM | POA: Diagnosis not present

## 2019-07-30 DIAGNOSIS — Z992 Dependence on renal dialysis: Secondary | ICD-10-CM | POA: Diagnosis not present

## 2019-07-30 DIAGNOSIS — E1029 Type 1 diabetes mellitus with other diabetic kidney complication: Secondary | ICD-10-CM | POA: Diagnosis not present

## 2019-07-30 DIAGNOSIS — N186 End stage renal disease: Secondary | ICD-10-CM | POA: Diagnosis not present

## 2019-07-30 DIAGNOSIS — D631 Anemia in chronic kidney disease: Secondary | ICD-10-CM | POA: Diagnosis not present

## 2019-08-02 DIAGNOSIS — E1029 Type 1 diabetes mellitus with other diabetic kidney complication: Secondary | ICD-10-CM | POA: Diagnosis not present

## 2019-08-02 DIAGNOSIS — N186 End stage renal disease: Secondary | ICD-10-CM | POA: Diagnosis not present

## 2019-08-02 DIAGNOSIS — Z992 Dependence on renal dialysis: Secondary | ICD-10-CM | POA: Diagnosis not present

## 2019-08-02 DIAGNOSIS — N2581 Secondary hyperparathyroidism of renal origin: Secondary | ICD-10-CM | POA: Diagnosis not present

## 2019-08-02 DIAGNOSIS — D631 Anemia in chronic kidney disease: Secondary | ICD-10-CM | POA: Diagnosis not present

## 2019-08-02 DIAGNOSIS — D689 Coagulation defect, unspecified: Secondary | ICD-10-CM | POA: Diagnosis not present

## 2019-08-04 DIAGNOSIS — E1029 Type 1 diabetes mellitus with other diabetic kidney complication: Secondary | ICD-10-CM | POA: Diagnosis not present

## 2019-08-04 DIAGNOSIS — Z992 Dependence on renal dialysis: Secondary | ICD-10-CM | POA: Diagnosis not present

## 2019-08-04 DIAGNOSIS — N2581 Secondary hyperparathyroidism of renal origin: Secondary | ICD-10-CM | POA: Diagnosis not present

## 2019-08-04 DIAGNOSIS — D689 Coagulation defect, unspecified: Secondary | ICD-10-CM | POA: Diagnosis not present

## 2019-08-04 DIAGNOSIS — N186 End stage renal disease: Secondary | ICD-10-CM | POA: Diagnosis not present

## 2019-08-04 DIAGNOSIS — D631 Anemia in chronic kidney disease: Secondary | ICD-10-CM | POA: Diagnosis not present

## 2019-08-06 DIAGNOSIS — Z992 Dependence on renal dialysis: Secondary | ICD-10-CM | POA: Diagnosis not present

## 2019-08-06 DIAGNOSIS — D689 Coagulation defect, unspecified: Secondary | ICD-10-CM | POA: Diagnosis not present

## 2019-08-06 DIAGNOSIS — D631 Anemia in chronic kidney disease: Secondary | ICD-10-CM | POA: Diagnosis not present

## 2019-08-06 DIAGNOSIS — E1029 Type 1 diabetes mellitus with other diabetic kidney complication: Secondary | ICD-10-CM | POA: Diagnosis not present

## 2019-08-06 DIAGNOSIS — N186 End stage renal disease: Secondary | ICD-10-CM | POA: Diagnosis not present

## 2019-08-06 DIAGNOSIS — N2581 Secondary hyperparathyroidism of renal origin: Secondary | ICD-10-CM | POA: Diagnosis not present

## 2019-08-09 DIAGNOSIS — D689 Coagulation defect, unspecified: Secondary | ICD-10-CM | POA: Diagnosis not present

## 2019-08-09 DIAGNOSIS — N2581 Secondary hyperparathyroidism of renal origin: Secondary | ICD-10-CM | POA: Diagnosis not present

## 2019-08-09 DIAGNOSIS — E1029 Type 1 diabetes mellitus with other diabetic kidney complication: Secondary | ICD-10-CM | POA: Diagnosis not present

## 2019-08-09 DIAGNOSIS — D631 Anemia in chronic kidney disease: Secondary | ICD-10-CM | POA: Diagnosis not present

## 2019-08-09 DIAGNOSIS — Z992 Dependence on renal dialysis: Secondary | ICD-10-CM | POA: Diagnosis not present

## 2019-08-09 DIAGNOSIS — N186 End stage renal disease: Secondary | ICD-10-CM | POA: Diagnosis not present

## 2019-08-11 DIAGNOSIS — D689 Coagulation defect, unspecified: Secondary | ICD-10-CM | POA: Diagnosis not present

## 2019-08-11 DIAGNOSIS — Z992 Dependence on renal dialysis: Secondary | ICD-10-CM | POA: Diagnosis not present

## 2019-08-11 DIAGNOSIS — D631 Anemia in chronic kidney disease: Secondary | ICD-10-CM | POA: Diagnosis not present

## 2019-08-11 DIAGNOSIS — N186 End stage renal disease: Secondary | ICD-10-CM | POA: Diagnosis not present

## 2019-08-11 DIAGNOSIS — E1029 Type 1 diabetes mellitus with other diabetic kidney complication: Secondary | ICD-10-CM | POA: Diagnosis not present

## 2019-08-11 DIAGNOSIS — N2581 Secondary hyperparathyroidism of renal origin: Secondary | ICD-10-CM | POA: Diagnosis not present

## 2019-08-13 DIAGNOSIS — D689 Coagulation defect, unspecified: Secondary | ICD-10-CM | POA: Diagnosis not present

## 2019-08-13 DIAGNOSIS — N186 End stage renal disease: Secondary | ICD-10-CM | POA: Diagnosis not present

## 2019-08-13 DIAGNOSIS — N2581 Secondary hyperparathyroidism of renal origin: Secondary | ICD-10-CM | POA: Diagnosis not present

## 2019-08-13 DIAGNOSIS — D631 Anemia in chronic kidney disease: Secondary | ICD-10-CM | POA: Diagnosis not present

## 2019-08-13 DIAGNOSIS — E1029 Type 1 diabetes mellitus with other diabetic kidney complication: Secondary | ICD-10-CM | POA: Diagnosis not present

## 2019-08-13 DIAGNOSIS — Z992 Dependence on renal dialysis: Secondary | ICD-10-CM | POA: Diagnosis not present

## 2019-08-16 DIAGNOSIS — D689 Coagulation defect, unspecified: Secondary | ICD-10-CM | POA: Diagnosis not present

## 2019-08-16 DIAGNOSIS — N186 End stage renal disease: Secondary | ICD-10-CM | POA: Diagnosis not present

## 2019-08-16 DIAGNOSIS — N2581 Secondary hyperparathyroidism of renal origin: Secondary | ICD-10-CM | POA: Diagnosis not present

## 2019-08-16 DIAGNOSIS — D631 Anemia in chronic kidney disease: Secondary | ICD-10-CM | POA: Diagnosis not present

## 2019-08-16 DIAGNOSIS — E1029 Type 1 diabetes mellitus with other diabetic kidney complication: Secondary | ICD-10-CM | POA: Diagnosis not present

## 2019-08-16 DIAGNOSIS — Z992 Dependence on renal dialysis: Secondary | ICD-10-CM | POA: Diagnosis not present

## 2019-08-18 DIAGNOSIS — N186 End stage renal disease: Secondary | ICD-10-CM | POA: Diagnosis not present

## 2019-08-18 DIAGNOSIS — Z992 Dependence on renal dialysis: Secondary | ICD-10-CM | POA: Diagnosis not present

## 2019-08-18 DIAGNOSIS — D689 Coagulation defect, unspecified: Secondary | ICD-10-CM | POA: Diagnosis not present

## 2019-08-18 DIAGNOSIS — E1029 Type 1 diabetes mellitus with other diabetic kidney complication: Secondary | ICD-10-CM | POA: Diagnosis not present

## 2019-08-18 DIAGNOSIS — D631 Anemia in chronic kidney disease: Secondary | ICD-10-CM | POA: Diagnosis not present

## 2019-08-18 DIAGNOSIS — N2581 Secondary hyperparathyroidism of renal origin: Secondary | ICD-10-CM | POA: Diagnosis not present

## 2019-08-19 DIAGNOSIS — E1022 Type 1 diabetes mellitus with diabetic chronic kidney disease: Secondary | ICD-10-CM | POA: Diagnosis not present

## 2019-08-20 DIAGNOSIS — E1029 Type 1 diabetes mellitus with other diabetic kidney complication: Secondary | ICD-10-CM | POA: Diagnosis not present

## 2019-08-20 DIAGNOSIS — D689 Coagulation defect, unspecified: Secondary | ICD-10-CM | POA: Diagnosis not present

## 2019-08-20 DIAGNOSIS — N2581 Secondary hyperparathyroidism of renal origin: Secondary | ICD-10-CM | POA: Diagnosis not present

## 2019-08-20 DIAGNOSIS — N186 End stage renal disease: Secondary | ICD-10-CM | POA: Diagnosis not present

## 2019-08-20 DIAGNOSIS — Z992 Dependence on renal dialysis: Secondary | ICD-10-CM | POA: Diagnosis not present

## 2019-08-20 DIAGNOSIS — D631 Anemia in chronic kidney disease: Secondary | ICD-10-CM | POA: Diagnosis not present

## 2019-08-21 DIAGNOSIS — Z992 Dependence on renal dialysis: Secondary | ICD-10-CM | POA: Diagnosis not present

## 2019-08-21 DIAGNOSIS — E1029 Type 1 diabetes mellitus with other diabetic kidney complication: Secondary | ICD-10-CM | POA: Diagnosis not present

## 2019-08-21 DIAGNOSIS — N186 End stage renal disease: Secondary | ICD-10-CM | POA: Diagnosis not present

## 2019-08-23 DIAGNOSIS — N186 End stage renal disease: Secondary | ICD-10-CM | POA: Diagnosis not present

## 2019-08-23 DIAGNOSIS — D631 Anemia in chronic kidney disease: Secondary | ICD-10-CM | POA: Diagnosis not present

## 2019-08-23 DIAGNOSIS — Z992 Dependence on renal dialysis: Secondary | ICD-10-CM | POA: Diagnosis not present

## 2019-08-23 DIAGNOSIS — E1029 Type 1 diabetes mellitus with other diabetic kidney complication: Secondary | ICD-10-CM | POA: Diagnosis not present

## 2019-08-23 DIAGNOSIS — N2581 Secondary hyperparathyroidism of renal origin: Secondary | ICD-10-CM | POA: Diagnosis not present

## 2019-08-23 DIAGNOSIS — D689 Coagulation defect, unspecified: Secondary | ICD-10-CM | POA: Diagnosis not present

## 2019-08-25 DIAGNOSIS — D689 Coagulation defect, unspecified: Secondary | ICD-10-CM | POA: Diagnosis not present

## 2019-08-25 DIAGNOSIS — N2581 Secondary hyperparathyroidism of renal origin: Secondary | ICD-10-CM | POA: Diagnosis not present

## 2019-08-25 DIAGNOSIS — E1029 Type 1 diabetes mellitus with other diabetic kidney complication: Secondary | ICD-10-CM | POA: Diagnosis not present

## 2019-08-25 DIAGNOSIS — Z992 Dependence on renal dialysis: Secondary | ICD-10-CM | POA: Diagnosis not present

## 2019-08-25 DIAGNOSIS — N186 End stage renal disease: Secondary | ICD-10-CM | POA: Diagnosis not present

## 2019-08-25 DIAGNOSIS — D631 Anemia in chronic kidney disease: Secondary | ICD-10-CM | POA: Diagnosis not present

## 2019-08-27 DIAGNOSIS — Z992 Dependence on renal dialysis: Secondary | ICD-10-CM | POA: Diagnosis not present

## 2019-08-27 DIAGNOSIS — N186 End stage renal disease: Secondary | ICD-10-CM | POA: Diagnosis not present

## 2019-08-27 DIAGNOSIS — N2581 Secondary hyperparathyroidism of renal origin: Secondary | ICD-10-CM | POA: Diagnosis not present

## 2019-08-27 DIAGNOSIS — E1029 Type 1 diabetes mellitus with other diabetic kidney complication: Secondary | ICD-10-CM | POA: Diagnosis not present

## 2019-08-27 DIAGNOSIS — D689 Coagulation defect, unspecified: Secondary | ICD-10-CM | POA: Diagnosis not present

## 2019-08-27 DIAGNOSIS — D631 Anemia in chronic kidney disease: Secondary | ICD-10-CM | POA: Diagnosis not present

## 2019-08-30 DIAGNOSIS — Z992 Dependence on renal dialysis: Secondary | ICD-10-CM | POA: Diagnosis not present

## 2019-08-30 DIAGNOSIS — N186 End stage renal disease: Secondary | ICD-10-CM | POA: Diagnosis not present

## 2019-08-30 DIAGNOSIS — D631 Anemia in chronic kidney disease: Secondary | ICD-10-CM | POA: Diagnosis not present

## 2019-08-30 DIAGNOSIS — D689 Coagulation defect, unspecified: Secondary | ICD-10-CM | POA: Diagnosis not present

## 2019-08-30 DIAGNOSIS — E1029 Type 1 diabetes mellitus with other diabetic kidney complication: Secondary | ICD-10-CM | POA: Diagnosis not present

## 2019-08-30 DIAGNOSIS — N2581 Secondary hyperparathyroidism of renal origin: Secondary | ICD-10-CM | POA: Diagnosis not present

## 2019-09-01 DIAGNOSIS — N2581 Secondary hyperparathyroidism of renal origin: Secondary | ICD-10-CM | POA: Diagnosis not present

## 2019-09-01 DIAGNOSIS — Z992 Dependence on renal dialysis: Secondary | ICD-10-CM | POA: Diagnosis not present

## 2019-09-01 DIAGNOSIS — E1029 Type 1 diabetes mellitus with other diabetic kidney complication: Secondary | ICD-10-CM | POA: Diagnosis not present

## 2019-09-01 DIAGNOSIS — D689 Coagulation defect, unspecified: Secondary | ICD-10-CM | POA: Diagnosis not present

## 2019-09-01 DIAGNOSIS — N186 End stage renal disease: Secondary | ICD-10-CM | POA: Diagnosis not present

## 2019-09-01 DIAGNOSIS — D631 Anemia in chronic kidney disease: Secondary | ICD-10-CM | POA: Diagnosis not present

## 2019-09-03 DIAGNOSIS — E1029 Type 1 diabetes mellitus with other diabetic kidney complication: Secondary | ICD-10-CM | POA: Diagnosis not present

## 2019-09-03 DIAGNOSIS — Z992 Dependence on renal dialysis: Secondary | ICD-10-CM | POA: Diagnosis not present

## 2019-09-03 DIAGNOSIS — N186 End stage renal disease: Secondary | ICD-10-CM | POA: Diagnosis not present

## 2019-09-03 DIAGNOSIS — D689 Coagulation defect, unspecified: Secondary | ICD-10-CM | POA: Diagnosis not present

## 2019-09-03 DIAGNOSIS — D631 Anemia in chronic kidney disease: Secondary | ICD-10-CM | POA: Diagnosis not present

## 2019-09-03 DIAGNOSIS — N2581 Secondary hyperparathyroidism of renal origin: Secondary | ICD-10-CM | POA: Diagnosis not present

## 2019-09-06 DIAGNOSIS — E1029 Type 1 diabetes mellitus with other diabetic kidney complication: Secondary | ICD-10-CM | POA: Diagnosis not present

## 2019-09-06 DIAGNOSIS — N186 End stage renal disease: Secondary | ICD-10-CM | POA: Diagnosis not present

## 2019-09-06 DIAGNOSIS — D689 Coagulation defect, unspecified: Secondary | ICD-10-CM | POA: Diagnosis not present

## 2019-09-06 DIAGNOSIS — Z992 Dependence on renal dialysis: Secondary | ICD-10-CM | POA: Diagnosis not present

## 2019-09-06 DIAGNOSIS — D631 Anemia in chronic kidney disease: Secondary | ICD-10-CM | POA: Diagnosis not present

## 2019-09-06 DIAGNOSIS — N2581 Secondary hyperparathyroidism of renal origin: Secondary | ICD-10-CM | POA: Diagnosis not present

## 2019-09-08 DIAGNOSIS — D689 Coagulation defect, unspecified: Secondary | ICD-10-CM | POA: Diagnosis not present

## 2019-09-08 DIAGNOSIS — E1029 Type 1 diabetes mellitus with other diabetic kidney complication: Secondary | ICD-10-CM | POA: Diagnosis not present

## 2019-09-08 DIAGNOSIS — Z992 Dependence on renal dialysis: Secondary | ICD-10-CM | POA: Diagnosis not present

## 2019-09-08 DIAGNOSIS — N2581 Secondary hyperparathyroidism of renal origin: Secondary | ICD-10-CM | POA: Diagnosis not present

## 2019-09-08 DIAGNOSIS — N186 End stage renal disease: Secondary | ICD-10-CM | POA: Diagnosis not present

## 2019-09-08 DIAGNOSIS — D631 Anemia in chronic kidney disease: Secondary | ICD-10-CM | POA: Diagnosis not present

## 2019-09-09 DIAGNOSIS — Z794 Long term (current) use of insulin: Secondary | ICD-10-CM | POA: Diagnosis not present

## 2019-09-09 DIAGNOSIS — E10319 Type 1 diabetes mellitus with unspecified diabetic retinopathy without macular edema: Secondary | ICD-10-CM | POA: Diagnosis not present

## 2019-09-09 DIAGNOSIS — N186 End stage renal disease: Secondary | ICD-10-CM | POA: Diagnosis not present

## 2019-09-09 DIAGNOSIS — Z992 Dependence on renal dialysis: Secondary | ICD-10-CM | POA: Diagnosis not present

## 2019-09-09 DIAGNOSIS — E1022 Type 1 diabetes mellitus with diabetic chronic kidney disease: Secondary | ICD-10-CM | POA: Diagnosis not present

## 2019-09-10 DIAGNOSIS — D631 Anemia in chronic kidney disease: Secondary | ICD-10-CM | POA: Diagnosis not present

## 2019-09-10 DIAGNOSIS — N186 End stage renal disease: Secondary | ICD-10-CM | POA: Diagnosis not present

## 2019-09-10 DIAGNOSIS — N2581 Secondary hyperparathyroidism of renal origin: Secondary | ICD-10-CM | POA: Diagnosis not present

## 2019-09-10 DIAGNOSIS — D689 Coagulation defect, unspecified: Secondary | ICD-10-CM | POA: Diagnosis not present

## 2019-09-10 DIAGNOSIS — Z992 Dependence on renal dialysis: Secondary | ICD-10-CM | POA: Diagnosis not present

## 2019-09-10 DIAGNOSIS — E1029 Type 1 diabetes mellitus with other diabetic kidney complication: Secondary | ICD-10-CM | POA: Diagnosis not present

## 2019-09-13 DIAGNOSIS — E1029 Type 1 diabetes mellitus with other diabetic kidney complication: Secondary | ICD-10-CM | POA: Diagnosis not present

## 2019-09-13 DIAGNOSIS — Z992 Dependence on renal dialysis: Secondary | ICD-10-CM | POA: Diagnosis not present

## 2019-09-13 DIAGNOSIS — N186 End stage renal disease: Secondary | ICD-10-CM | POA: Diagnosis not present

## 2019-09-13 DIAGNOSIS — D689 Coagulation defect, unspecified: Secondary | ICD-10-CM | POA: Diagnosis not present

## 2019-09-13 DIAGNOSIS — N2581 Secondary hyperparathyroidism of renal origin: Secondary | ICD-10-CM | POA: Diagnosis not present

## 2019-09-13 DIAGNOSIS — D631 Anemia in chronic kidney disease: Secondary | ICD-10-CM | POA: Diagnosis not present

## 2019-09-15 DIAGNOSIS — D689 Coagulation defect, unspecified: Secondary | ICD-10-CM | POA: Diagnosis not present

## 2019-09-15 DIAGNOSIS — N186 End stage renal disease: Secondary | ICD-10-CM | POA: Diagnosis not present

## 2019-09-15 DIAGNOSIS — E1029 Type 1 diabetes mellitus with other diabetic kidney complication: Secondary | ICD-10-CM | POA: Diagnosis not present

## 2019-09-15 DIAGNOSIS — Z992 Dependence on renal dialysis: Secondary | ICD-10-CM | POA: Diagnosis not present

## 2019-09-15 DIAGNOSIS — D631 Anemia in chronic kidney disease: Secondary | ICD-10-CM | POA: Diagnosis not present

## 2019-09-15 DIAGNOSIS — N2581 Secondary hyperparathyroidism of renal origin: Secondary | ICD-10-CM | POA: Diagnosis not present

## 2019-09-17 DIAGNOSIS — E1029 Type 1 diabetes mellitus with other diabetic kidney complication: Secondary | ICD-10-CM | POA: Diagnosis not present

## 2019-09-17 DIAGNOSIS — Z992 Dependence on renal dialysis: Secondary | ICD-10-CM | POA: Diagnosis not present

## 2019-09-17 DIAGNOSIS — D631 Anemia in chronic kidney disease: Secondary | ICD-10-CM | POA: Diagnosis not present

## 2019-09-17 DIAGNOSIS — D689 Coagulation defect, unspecified: Secondary | ICD-10-CM | POA: Diagnosis not present

## 2019-09-17 DIAGNOSIS — N2581 Secondary hyperparathyroidism of renal origin: Secondary | ICD-10-CM | POA: Diagnosis not present

## 2019-09-17 DIAGNOSIS — N186 End stage renal disease: Secondary | ICD-10-CM | POA: Diagnosis not present

## 2019-09-18 DIAGNOSIS — E1029 Type 1 diabetes mellitus with other diabetic kidney complication: Secondary | ICD-10-CM | POA: Diagnosis not present

## 2019-09-18 DIAGNOSIS — N186 End stage renal disease: Secondary | ICD-10-CM | POA: Diagnosis not present

## 2019-09-18 DIAGNOSIS — Z992 Dependence on renal dialysis: Secondary | ICD-10-CM | POA: Diagnosis not present

## 2019-09-20 DIAGNOSIS — D689 Coagulation defect, unspecified: Secondary | ICD-10-CM | POA: Diagnosis not present

## 2019-09-20 DIAGNOSIS — Z992 Dependence on renal dialysis: Secondary | ICD-10-CM | POA: Diagnosis not present

## 2019-09-20 DIAGNOSIS — E1029 Type 1 diabetes mellitus with other diabetic kidney complication: Secondary | ICD-10-CM | POA: Diagnosis not present

## 2019-09-20 DIAGNOSIS — D631 Anemia in chronic kidney disease: Secondary | ICD-10-CM | POA: Diagnosis not present

## 2019-09-20 DIAGNOSIS — N186 End stage renal disease: Secondary | ICD-10-CM | POA: Diagnosis not present

## 2019-09-20 DIAGNOSIS — N2581 Secondary hyperparathyroidism of renal origin: Secondary | ICD-10-CM | POA: Diagnosis not present

## 2019-09-22 DIAGNOSIS — N186 End stage renal disease: Secondary | ICD-10-CM | POA: Diagnosis not present

## 2019-09-22 DIAGNOSIS — D631 Anemia in chronic kidney disease: Secondary | ICD-10-CM | POA: Diagnosis not present

## 2019-09-22 DIAGNOSIS — Z992 Dependence on renal dialysis: Secondary | ICD-10-CM | POA: Diagnosis not present

## 2019-09-22 DIAGNOSIS — D689 Coagulation defect, unspecified: Secondary | ICD-10-CM | POA: Diagnosis not present

## 2019-09-22 DIAGNOSIS — N2581 Secondary hyperparathyroidism of renal origin: Secondary | ICD-10-CM | POA: Diagnosis not present

## 2019-09-22 DIAGNOSIS — E1029 Type 1 diabetes mellitus with other diabetic kidney complication: Secondary | ICD-10-CM | POA: Diagnosis not present

## 2019-09-24 DIAGNOSIS — N186 End stage renal disease: Secondary | ICD-10-CM | POA: Diagnosis not present

## 2019-09-24 DIAGNOSIS — D631 Anemia in chronic kidney disease: Secondary | ICD-10-CM | POA: Diagnosis not present

## 2019-09-24 DIAGNOSIS — Z992 Dependence on renal dialysis: Secondary | ICD-10-CM | POA: Diagnosis not present

## 2019-09-24 DIAGNOSIS — D689 Coagulation defect, unspecified: Secondary | ICD-10-CM | POA: Diagnosis not present

## 2019-09-24 DIAGNOSIS — N2581 Secondary hyperparathyroidism of renal origin: Secondary | ICD-10-CM | POA: Diagnosis not present

## 2019-09-24 DIAGNOSIS — E1029 Type 1 diabetes mellitus with other diabetic kidney complication: Secondary | ICD-10-CM | POA: Diagnosis not present

## 2019-09-27 DIAGNOSIS — D631 Anemia in chronic kidney disease: Secondary | ICD-10-CM | POA: Diagnosis not present

## 2019-09-27 DIAGNOSIS — N2581 Secondary hyperparathyroidism of renal origin: Secondary | ICD-10-CM | POA: Diagnosis not present

## 2019-09-27 DIAGNOSIS — Z992 Dependence on renal dialysis: Secondary | ICD-10-CM | POA: Diagnosis not present

## 2019-09-27 DIAGNOSIS — N186 End stage renal disease: Secondary | ICD-10-CM | POA: Diagnosis not present

## 2019-09-27 DIAGNOSIS — E1029 Type 1 diabetes mellitus with other diabetic kidney complication: Secondary | ICD-10-CM | POA: Diagnosis not present

## 2019-09-27 DIAGNOSIS — D689 Coagulation defect, unspecified: Secondary | ICD-10-CM | POA: Diagnosis not present

## 2019-09-29 DIAGNOSIS — D631 Anemia in chronic kidney disease: Secondary | ICD-10-CM | POA: Diagnosis not present

## 2019-09-29 DIAGNOSIS — D689 Coagulation defect, unspecified: Secondary | ICD-10-CM | POA: Diagnosis not present

## 2019-09-29 DIAGNOSIS — Z992 Dependence on renal dialysis: Secondary | ICD-10-CM | POA: Diagnosis not present

## 2019-09-29 DIAGNOSIS — E1029 Type 1 diabetes mellitus with other diabetic kidney complication: Secondary | ICD-10-CM | POA: Diagnosis not present

## 2019-09-29 DIAGNOSIS — N2581 Secondary hyperparathyroidism of renal origin: Secondary | ICD-10-CM | POA: Diagnosis not present

## 2019-09-29 DIAGNOSIS — N186 End stage renal disease: Secondary | ICD-10-CM | POA: Diagnosis not present

## 2019-09-30 DIAGNOSIS — E1022 Type 1 diabetes mellitus with diabetic chronic kidney disease: Secondary | ICD-10-CM | POA: Diagnosis not present

## 2019-10-01 DIAGNOSIS — Z992 Dependence on renal dialysis: Secondary | ICD-10-CM | POA: Diagnosis not present

## 2019-10-01 DIAGNOSIS — D689 Coagulation defect, unspecified: Secondary | ICD-10-CM | POA: Diagnosis not present

## 2019-10-01 DIAGNOSIS — E1029 Type 1 diabetes mellitus with other diabetic kidney complication: Secondary | ICD-10-CM | POA: Diagnosis not present

## 2019-10-01 DIAGNOSIS — N186 End stage renal disease: Secondary | ICD-10-CM | POA: Diagnosis not present

## 2019-10-01 DIAGNOSIS — D631 Anemia in chronic kidney disease: Secondary | ICD-10-CM | POA: Diagnosis not present

## 2019-10-01 DIAGNOSIS — N2581 Secondary hyperparathyroidism of renal origin: Secondary | ICD-10-CM | POA: Diagnosis not present

## 2019-10-04 DIAGNOSIS — N186 End stage renal disease: Secondary | ICD-10-CM | POA: Diagnosis not present

## 2019-10-04 DIAGNOSIS — D631 Anemia in chronic kidney disease: Secondary | ICD-10-CM | POA: Diagnosis not present

## 2019-10-04 DIAGNOSIS — E1029 Type 1 diabetes mellitus with other diabetic kidney complication: Secondary | ICD-10-CM | POA: Diagnosis not present

## 2019-10-04 DIAGNOSIS — Z992 Dependence on renal dialysis: Secondary | ICD-10-CM | POA: Diagnosis not present

## 2019-10-04 DIAGNOSIS — N2581 Secondary hyperparathyroidism of renal origin: Secondary | ICD-10-CM | POA: Diagnosis not present

## 2019-10-04 DIAGNOSIS — D689 Coagulation defect, unspecified: Secondary | ICD-10-CM | POA: Diagnosis not present

## 2019-10-06 DIAGNOSIS — D689 Coagulation defect, unspecified: Secondary | ICD-10-CM | POA: Diagnosis not present

## 2019-10-06 DIAGNOSIS — N2581 Secondary hyperparathyroidism of renal origin: Secondary | ICD-10-CM | POA: Diagnosis not present

## 2019-10-06 DIAGNOSIS — D631 Anemia in chronic kidney disease: Secondary | ICD-10-CM | POA: Diagnosis not present

## 2019-10-06 DIAGNOSIS — N186 End stage renal disease: Secondary | ICD-10-CM | POA: Diagnosis not present

## 2019-10-06 DIAGNOSIS — Z992 Dependence on renal dialysis: Secondary | ICD-10-CM | POA: Diagnosis not present

## 2019-10-06 DIAGNOSIS — E1029 Type 1 diabetes mellitus with other diabetic kidney complication: Secondary | ICD-10-CM | POA: Diagnosis not present

## 2019-10-08 DIAGNOSIS — E1029 Type 1 diabetes mellitus with other diabetic kidney complication: Secondary | ICD-10-CM | POA: Diagnosis not present

## 2019-10-08 DIAGNOSIS — Z992 Dependence on renal dialysis: Secondary | ICD-10-CM | POA: Diagnosis not present

## 2019-10-08 DIAGNOSIS — N186 End stage renal disease: Secondary | ICD-10-CM | POA: Diagnosis not present

## 2019-10-08 DIAGNOSIS — D631 Anemia in chronic kidney disease: Secondary | ICD-10-CM | POA: Diagnosis not present

## 2019-10-08 DIAGNOSIS — D689 Coagulation defect, unspecified: Secondary | ICD-10-CM | POA: Diagnosis not present

## 2019-10-08 DIAGNOSIS — N2581 Secondary hyperparathyroidism of renal origin: Secondary | ICD-10-CM | POA: Diagnosis not present

## 2019-10-11 DIAGNOSIS — N186 End stage renal disease: Secondary | ICD-10-CM | POA: Diagnosis not present

## 2019-10-11 DIAGNOSIS — E1029 Type 1 diabetes mellitus with other diabetic kidney complication: Secondary | ICD-10-CM | POA: Diagnosis not present

## 2019-10-11 DIAGNOSIS — D631 Anemia in chronic kidney disease: Secondary | ICD-10-CM | POA: Diagnosis not present

## 2019-10-11 DIAGNOSIS — N2581 Secondary hyperparathyroidism of renal origin: Secondary | ICD-10-CM | POA: Diagnosis not present

## 2019-10-11 DIAGNOSIS — Z992 Dependence on renal dialysis: Secondary | ICD-10-CM | POA: Diagnosis not present

## 2019-10-11 DIAGNOSIS — D689 Coagulation defect, unspecified: Secondary | ICD-10-CM | POA: Diagnosis not present

## 2019-10-13 DIAGNOSIS — D631 Anemia in chronic kidney disease: Secondary | ICD-10-CM | POA: Diagnosis not present

## 2019-10-13 DIAGNOSIS — N186 End stage renal disease: Secondary | ICD-10-CM | POA: Diagnosis not present

## 2019-10-13 DIAGNOSIS — Z992 Dependence on renal dialysis: Secondary | ICD-10-CM | POA: Diagnosis not present

## 2019-10-13 DIAGNOSIS — N2581 Secondary hyperparathyroidism of renal origin: Secondary | ICD-10-CM | POA: Diagnosis not present

## 2019-10-13 DIAGNOSIS — D689 Coagulation defect, unspecified: Secondary | ICD-10-CM | POA: Diagnosis not present

## 2019-10-13 DIAGNOSIS — E1029 Type 1 diabetes mellitus with other diabetic kidney complication: Secondary | ICD-10-CM | POA: Diagnosis not present

## 2019-10-15 DIAGNOSIS — E1029 Type 1 diabetes mellitus with other diabetic kidney complication: Secondary | ICD-10-CM | POA: Diagnosis not present

## 2019-10-15 DIAGNOSIS — D689 Coagulation defect, unspecified: Secondary | ICD-10-CM | POA: Diagnosis not present

## 2019-10-15 DIAGNOSIS — N2581 Secondary hyperparathyroidism of renal origin: Secondary | ICD-10-CM | POA: Diagnosis not present

## 2019-10-15 DIAGNOSIS — D631 Anemia in chronic kidney disease: Secondary | ICD-10-CM | POA: Diagnosis not present

## 2019-10-15 DIAGNOSIS — N186 End stage renal disease: Secondary | ICD-10-CM | POA: Diagnosis not present

## 2019-10-15 DIAGNOSIS — Z992 Dependence on renal dialysis: Secondary | ICD-10-CM | POA: Diagnosis not present

## 2019-10-18 DIAGNOSIS — D631 Anemia in chronic kidney disease: Secondary | ICD-10-CM | POA: Diagnosis not present

## 2019-10-18 DIAGNOSIS — N186 End stage renal disease: Secondary | ICD-10-CM | POA: Diagnosis not present

## 2019-10-18 DIAGNOSIS — D689 Coagulation defect, unspecified: Secondary | ICD-10-CM | POA: Diagnosis not present

## 2019-10-18 DIAGNOSIS — E1029 Type 1 diabetes mellitus with other diabetic kidney complication: Secondary | ICD-10-CM | POA: Diagnosis not present

## 2019-10-18 DIAGNOSIS — N2581 Secondary hyperparathyroidism of renal origin: Secondary | ICD-10-CM | POA: Diagnosis not present

## 2019-10-18 DIAGNOSIS — Z992 Dependence on renal dialysis: Secondary | ICD-10-CM | POA: Diagnosis not present

## 2019-10-19 DIAGNOSIS — E1029 Type 1 diabetes mellitus with other diabetic kidney complication: Secondary | ICD-10-CM | POA: Diagnosis not present

## 2019-10-19 DIAGNOSIS — N186 End stage renal disease: Secondary | ICD-10-CM | POA: Diagnosis not present

## 2019-10-19 DIAGNOSIS — Z992 Dependence on renal dialysis: Secondary | ICD-10-CM | POA: Diagnosis not present

## 2019-10-20 ENCOUNTER — Inpatient Hospital Stay (HOSPITAL_COMMUNITY): Payer: Medicare HMO

## 2019-10-20 ENCOUNTER — Encounter (HOSPITAL_COMMUNITY): Payer: Self-pay | Admitting: Emergency Medicine

## 2019-10-20 ENCOUNTER — Observation Stay (HOSPITAL_COMMUNITY)
Admission: EM | Admit: 2019-10-20 | Discharge: 2019-10-21 | Disposition: A | Discharge status: Home / Self Care | Payer: Medicare HMO | Attending: Internal Medicine | Admitting: Internal Medicine

## 2019-10-20 DIAGNOSIS — Z79899 Other long term (current) drug therapy: Secondary | ICD-10-CM | POA: Insufficient documentation

## 2019-10-20 DIAGNOSIS — I120 Hypertensive chronic kidney disease with stage 5 chronic kidney disease or end stage renal disease: Secondary | ICD-10-CM | POA: Diagnosis not present

## 2019-10-20 DIAGNOSIS — E1051 Type 1 diabetes mellitus with diabetic peripheral angiopathy without gangrene: Secondary | ICD-10-CM | POA: Diagnosis not present

## 2019-10-20 DIAGNOSIS — Y841 Kidney dialysis as the cause of abnormal reaction of the patient, or of later complication, without mention of misadventure at the time of the procedure: Secondary | ICD-10-CM | POA: Insufficient documentation

## 2019-10-20 DIAGNOSIS — Z20822 Contact with and (suspected) exposure to covid-19: Secondary | ICD-10-CM | POA: Insufficient documentation

## 2019-10-20 DIAGNOSIS — R188 Other ascites: Secondary | ICD-10-CM | POA: Diagnosis not present

## 2019-10-20 DIAGNOSIS — E11649 Type 2 diabetes mellitus with hypoglycemia without coma: Secondary | ICD-10-CM | POA: Diagnosis not present

## 2019-10-20 DIAGNOSIS — Z7982 Long term (current) use of aspirin: Secondary | ICD-10-CM | POA: Diagnosis not present

## 2019-10-20 DIAGNOSIS — N2581 Secondary hyperparathyroidism of renal origin: Secondary | ICD-10-CM | POA: Diagnosis not present

## 2019-10-20 DIAGNOSIS — E1022 Type 1 diabetes mellitus with diabetic chronic kidney disease: Secondary | ICD-10-CM | POA: Insufficient documentation

## 2019-10-20 DIAGNOSIS — Z8673 Personal history of transient ischemic attack (TIA), and cerebral infarction without residual deficits: Secondary | ICD-10-CM | POA: Diagnosis not present

## 2019-10-20 DIAGNOSIS — E8770 Fluid overload, unspecified: Secondary | ICD-10-CM | POA: Diagnosis not present

## 2019-10-20 DIAGNOSIS — D631 Anemia in chronic kidney disease: Secondary | ICD-10-CM | POA: Diagnosis not present

## 2019-10-20 DIAGNOSIS — T82838A Hemorrhage of vascular prosthetic devices, implants and grafts, initial encounter: Principal | ICD-10-CM

## 2019-10-20 DIAGNOSIS — K219 Gastro-esophageal reflux disease without esophagitis: Secondary | ICD-10-CM | POA: Insufficient documentation

## 2019-10-20 DIAGNOSIS — N186 End stage renal disease: Secondary | ICD-10-CM

## 2019-10-20 DIAGNOSIS — Z794 Long term (current) use of insulin: Secondary | ICD-10-CM | POA: Insufficient documentation

## 2019-10-20 DIAGNOSIS — D689 Coagulation defect, unspecified: Secondary | ICD-10-CM | POA: Diagnosis not present

## 2019-10-20 DIAGNOSIS — E162 Hypoglycemia, unspecified: Secondary | ICD-10-CM

## 2019-10-20 DIAGNOSIS — E785 Hyperlipidemia, unspecified: Secondary | ICD-10-CM | POA: Insufficient documentation

## 2019-10-20 DIAGNOSIS — R140 Abdominal distension (gaseous): Secondary | ICD-10-CM

## 2019-10-20 DIAGNOSIS — R739 Hyperglycemia, unspecified: Secondary | ICD-10-CM | POA: Diagnosis not present

## 2019-10-20 DIAGNOSIS — Z992 Dependence on renal dialysis: Secondary | ICD-10-CM | POA: Insufficient documentation

## 2019-10-20 DIAGNOSIS — E1029 Type 1 diabetes mellitus with other diabetic kidney complication: Secondary | ICD-10-CM | POA: Diagnosis not present

## 2019-10-20 DIAGNOSIS — D509 Iron deficiency anemia, unspecified: Secondary | ICD-10-CM | POA: Diagnosis not present

## 2019-10-20 HISTORY — DX: Hemorrhage due to vascular prosthetic devices, implants and grafts, initial encounter: T82.838A

## 2019-10-20 LAB — BASIC METABOLIC PANEL
Anion gap: 16 — ABNORMAL HIGH (ref 5–15)
BUN: 16 mg/dL (ref 6–20)
CO2: 30 mmol/L (ref 22–32)
Calcium: 9.8 mg/dL (ref 8.9–10.3)
Chloride: 94 mmol/L — ABNORMAL LOW (ref 98–111)
Creatinine, Ser: 3.81 mg/dL — ABNORMAL HIGH (ref 0.44–1.00)
GFR calc Af Amer: 16 mL/min — ABNORMAL LOW (ref >60)
GFR calc non Af Amer: 14 mL/min — ABNORMAL LOW (ref >60)
Glucose, Bld: 49 mg/dL — ABNORMAL LOW (ref 70–99)
Potassium: 5.4 mmol/L — ABNORMAL HIGH (ref 3.5–5.1)
Sodium: 140 mmol/L (ref 135–145)

## 2019-10-20 LAB — CBC
HCT: 41.4 % (ref 36.0–46.0)
Hemoglobin: 13.3 g/dL (ref 12.0–15.0)
MCH: 27.4 pg (ref 26.0–34.0)
MCHC: 32.1 g/dL (ref 30.0–36.0)
MCV: 85.2 fL (ref 80.0–100.0)
Platelets: 124 10*3/uL — ABNORMAL LOW (ref 150–400)
RBC: 4.86 MIL/uL (ref 3.87–5.11)
RDW: 20.7 % — ABNORMAL HIGH (ref 11.5–15.5)
WBC: 5.1 10*3/uL (ref 4.0–10.5)
nRBC: 0 % (ref 0.0–0.2)

## 2019-10-20 LAB — CBG MONITORING, ED
Glucose-Capillary: 50 mg/dL — ABNORMAL LOW (ref 70–99)
Glucose-Capillary: 119 mg/dL — ABNORMAL HIGH (ref 70–99)

## 2019-10-20 LAB — HEPATIC FUNCTION PANEL
ALT: 49 U/L — ABNORMAL HIGH (ref 0–44)
AST: 77 U/L — ABNORMAL HIGH (ref 15–41)
Albumin: 3.8 g/dL (ref 3.5–5.0)
Alkaline Phosphatase: 520 U/L — ABNORMAL HIGH (ref 38–126)
Bilirubin, Direct: 1.6 mg/dL — ABNORMAL HIGH (ref 0.0–0.2)
Indirect Bilirubin: 0 mg/dL — ABNORMAL LOW (ref 0.3–0.9)
Total Bilirubin: 1.6 mg/dL — ABNORMAL HIGH (ref 0.3–1.2)
Total Protein: 7.5 g/dL (ref 6.5–8.1)

## 2019-10-20 LAB — BRAIN NATRIURETIC PEPTIDE: B Natriuretic Peptide: 2380.4 pg/mL — ABNORMAL HIGH (ref 0.0–100.0)

## 2019-10-20 LAB — RESPIRATORY PANEL BY RT PCR (FLU A&B, COVID)
Influenza A by PCR: NEGATIVE
Influenza B by PCR: NEGATIVE
SARS Coronavirus 2 by RT PCR: NEGATIVE

## 2019-10-20 LAB — HIV ANTIBODY (ROUTINE TESTING W REFLEX): HIV Screen 4th Generation wRfx: NONREACTIVE

## 2019-10-20 LAB — GLUCOSE, CAPILLARY: Glucose-Capillary: 183 mg/dL — ABNORMAL HIGH (ref 70–99)

## 2019-10-20 MED ORDER — LABETALOL HCL 300 MG PO TABS: 300.0000 mg | ORAL_TABLET | Freq: Three times a day (TID) | ORAL | Status: DC

## 2019-10-20 MED ORDER — ACETAMINOPHEN 650 MG RE SUPP: 650.0000 mg | Freq: Four times a day (QID) | RECTAL | Status: DC | PRN

## 2019-10-20 MED ORDER — CEFAZOLIN SODIUM-DEXTROSE 1-4 GM/50ML-% IV SOLN
1.0000 g | INTRAVENOUS | Status: AC
  Administered 2019-10-21: 1 g via INTRAVENOUS
  Filled 2019-10-20: qty 50

## 2019-10-20 MED ORDER — SODIUM ZIRCONIUM CYCLOSILICATE 10 G PO PACK: 10.0000 g | PACK | Freq: Once | ORAL | Status: DC

## 2019-10-20 MED ORDER — SODIUM CHLORIDE 0.9 % IV SOLN
INTRAVENOUS | Status: DC | PRN
  Administered 2019-10-20: 500 mL via INTRAVENOUS

## 2019-10-20 MED ORDER — AMLODIPINE BESYLATE 10 MG PO TABS
10.0000 mg | ORAL_TABLET | Freq: Every day | ORAL | Status: DC
  Administered 2019-10-20: 10 mg via ORAL
  Filled 2019-10-20 (×2): qty 1

## 2019-10-20 MED ORDER — CALCIUM ACETATE (PHOS BINDER) 667 MG PO CAPS
667.0000 mg | ORAL_CAPSULE | Freq: Three times a day (TID) | ORAL | Status: DC
  Filled 2019-10-20: qty 1

## 2019-10-20 MED ORDER — LOSARTAN POTASSIUM 25 MG PO TABS
25.0000 mg | ORAL_TABLET | Freq: Every day | ORAL | Status: DC
  Administered 2019-10-21: 25 mg via ORAL
  Filled 2019-10-20: qty 1

## 2019-10-20 MED ORDER — ROSUVASTATIN CALCIUM 5 MG PO TABS
10.0000 mg | ORAL_TABLET | Freq: Every day | ORAL | Status: DC
  Administered 2019-10-20: 10 mg via ORAL
  Filled 2019-10-20 (×2): qty 2

## 2019-10-20 MED ORDER — HEPARIN SODIUM (PORCINE) 5000 UNIT/ML IJ SOLN: 5000.0000 [IU] | Freq: Three times a day (TID) | INTRAMUSCULAR | Status: DC

## 2019-10-20 MED ORDER — POLYETHYLENE GLYCOL 3350 17 G PO PACK: 17.0000 g | PACK | Freq: Every day | ORAL | Status: DC | PRN

## 2019-10-20 MED ORDER — ASPIRIN 81 MG PO TBEC
81.0000 mg | DELAYED_RELEASE_TABLET | Freq: Every day | ORAL | Status: DC
  Filled 2019-10-20: qty 1

## 2019-10-20 MED ORDER — ACETAMINOPHEN 325 MG PO TABS: 650.0000 mg | ORAL_TABLET | Freq: Four times a day (QID) | ORAL | Status: DC | PRN

## 2019-10-20 MED ORDER — LABETALOL HCL 300 MG PO TABS
300.0000 mg | ORAL_TABLET | Freq: Two times a day (BID) | ORAL | Status: DC
  Administered 2019-10-20 – 2019-10-21 (×2): 300 mg via ORAL
  Filled 2019-10-20 (×2): qty 1

## 2019-10-20 NOTE — ED Notes (Signed)
Vascular team at bedside

## 2019-10-20 NOTE — H&P (Signed)
Date: 10/20/2019               Patient Name:  [name] MRN: [id number]  DOB: 10-Apr-1976 Age / Sex: 44 y.o., female   PCP: Benito Mccreedy, MD         Medical Service: Internal Medicine Teaching Service         Attending Physician: Dr. Aldine Contes, MD    First Contact: Marva Panda, MD, [place] Pager: [place] [phone])  Second Contact: Myrtie Hawk, MD, [place] Pager: EM [phone])       After Hours (After 5p/  First Contact Pager: [phone]  weekends / holidays): Second Contact Pager: [phone]   Chief Complaint: Ulcerated AV fistula   History of Present Illness: [name] is a  44 y.o. female w/ PMH significant for diabetes mellitus type I, ESRD on HD TTS, HTN and hyperlipidemia presenting from her dialysis center for concerns of bleeding from ulceration of the right AV fistula. Patient notes ulcer at right AV fistula  She reports noticing ulcer at her rt AV fistula since a week ago. She denies any trauma to the area. No erythema or swelling around fistula site. She was dialyzed today below the ulcer site and had full dialysis session. She denies any fevers, chills, nausea, vomiting, abdominal pain or diarrhea. However, she did note some headache and blurry vision following her HD session today and noted her CBG to be 40 for which she ate a cheeseburger with improvement. She notes that she does have episodes of hypoglycemia to 70s following dialysis sessions that usually resolves on its own.  She reports compliance to her medications and also does not missed her  HD sessions TTSat.  ED course: Patient presented with ulcer and bleeding of dialysis graft. Vascular surgery consulted; however, as patient has recently eaten, patient not operative candidate at the moment and will be scheduled in the OR in the morning. Also noted to have hypoglycemia with CBG of 50. On labs, Na 140, K 5.4, Cl 94, CO2 30, BUN/Cr 16/3.81, AG 16; WBC 5.1, Hb 13.3, Hct 41.4, Plt 124 and BNP 2380.  Patient admitted to internal medicine for hypoglycemia and potential for bleeding.   Meds:  Current Meds  Medication Sig  . amLODipine (NORVASC) 10 MG tablet Take 10 mg by mouth at bedtime.   Marland Kitchen aspirin 81 MG EC tablet Take 81 mg by mouth at bedtime. Swallow whole.   . calcium acetate (PHOSLO) 667 MG capsule Take 667 mg by mouth 3 (three) times daily with meals. May take an additional 667 mg dose with snacks  . insulin aspart (NOVOLOG) 100 UNIT/ML injection Inject 50 Units into the skin See admin instructions. Use with insulin pump daily  . Insulin Human (INSULIN PUMP) SOLN Inject into the skin continuous. Novolog   Basal rate 0.29 units/ hour  . labetalol (NORMODYNE) 300 MG tablet Take 300 mg by mouth 3 (three) times daily with meals.   Marland Kitchen loratadine (CLARITIN) 10 MG tablet Take 10 mg by mouth daily as needed for allergies.  Marland Kitchen losartan (COZAAR) 25 MG tablet Take 25 mg by mouth in the morning and at bedtime.  . Pediatric Multiple Vit-C-FA (FLINSTONES GUMMIES OMEGA-3 DHA) CHEW Chew 2 tablets by mouth in the morning.   . rosuvastatin (CRESTOR) 10 MG tablet Take 10 mg by mouth at bedtime.      Allergies: Allergies as of 10/20/2019 - Review Complete 10/20/2019  Allergen Reaction Noted  . Pollen extract Other (See Comments) 12/29/2013  Past Medical History:  Diagnosis Date  . Anemia   . Arthritis   . Diabetes mellitus   . Diabetic retinopathy ([place])    Hx of laser Rx's  . DM type 1 (diabetes mellitus, type 1) ([place]) 09/12/2014   Age of onset for DM type 1 was age 68.     . Enlarged thyroid gland   . ESRD on hemodialysis ([place])    ESRD due to DM type I, age of onset DM 1 was age 43.  Went on dialysis in March 2012.  Gets HD now at [facility] on a TTS schedule.    Marland Kitchen ESRD on hemodialysis ([place]) 09/12/2014   ESRD due to DM type 1.  Started HD in 2012 at [facility].  Gets HD there on a TTS schedule.    Marland Kitchen GERD (gastroesophageal reflux disease)   . History of blood transfusion   .  Hyperlipidemia   . Hypertension   . Peripheral vascular disease ([place])   . Pneumonia   . Stroke [facility])    facial drooping   Mother with cervical and breast cancer Father has HTN  Family History:  Family History  Problem Relation Age of Onset  . Cancer Mother        breast  . Hypertension Father      Social History:  Social History   Tobacco Use  . Smoking status: Never Smoker  . Smokeless tobacco: Never Used  Substance Use Topics  . Alcohol use: No    Alcohol/week: 0.0 standard drinks  . Drug use: No     Review of Systems: A complete ROS was negative except as per HPI.   Physical Exam: Blood pressure (!) 179/87, pulse 68, temperature 98 F (36.7 C), temperature source Oral, resp. rate (!) 21, height 5\' 3"  (1.6 m), weight 57 kg, SpO2 98 %. Physical Exam Constitutional:      General: She is not in acute distress.    Appearance: Normal appearance. She is not ill-appearing, toxic-appearing or diaphoretic.  HENT:     Head: Normocephalic and atraumatic.  Eyes:     General: Scleral icterus present.     Extraocular Movements: Extraocular movements intact.     Conjunctiva/sclera: Conjunctivae normal.     Pupils: Pupils are equal, round, and reactive to light.  Cardiovascular:     Rate and Rhythm: Normal rate and regular rhythm.     Pulses: Normal pulses.     Heart sounds: Murmur present. No gallop.      Comments: 3/6 systolic murmur at 2nd L ICS Pulmonary:     Effort: Pulmonary effort is normal. No respiratory distress.     Breath sounds: Normal breath sounds. No wheezing, rhonchi or rales.  Abdominal:     General: Abdomen is protuberant. Bowel sounds are normal. There is distension.     Palpations: Abdomen is soft. There is no shifting dullness or fluid wave.     Tenderness: There is abdominal tenderness in the right upper quadrant and epigastric area. There is no guarding or rebound.     Hernia: No hernia is present.  Musculoskeletal:        General: No tenderness.  Normal range of motion.     Cervical back: Normal range of motion and neck supple.     Right lower leg: Edema present.     Left lower leg: Edema present.     Comments: 2+ pitting edema around ankles bilaterally  Skin:    General: Skin is warm and dry.  Capillary Refill: Capillary refill takes less than 2 seconds.     Findings: Lesion present. No bruising.     Comments: LUE fistula ulceration (see below), palpable thrill below site of ulceration RUE fistula without palpable thrill  Neurological:     General: No focal deficit present.     Mental Status: She is alert and oriented to person, place, and time.     Cranial Nerves: No cranial nerve deficit.     Sensory: No sensory deficit.     Motor: No weakness.     Coordination: Coordination normal.      CBC Latest Ref Rng & Units 10/20/2019 04/16/2018 04/15/2018  WBC 4.0 - 10.5 K/uL 5.1 5.3 4.4  Hemoglobin 12.0 - 15.0 g/dL 13.3 10.4(L) 10.6(L)  Hematocrit 36.0 - 46.0 % 41.4 33.7(L) 34.3(L)  Platelets 150 - 400 K/uL 124(L) 190 152   BMP Latest Ref Rng & Units 10/20/2019 04/16/2018 04/15/2018  Glucose 70 - 99 mg/dL 49(L) 195(H) 201(H)  BUN 6 - 20 mg/dL 16 26(H) 16  Creatinine 0.44 - 1.00 mg/dL 3.81(H) 6.75(H) 5.57(H)  Sodium 135 - 145 mmol/L 140 138 135  Potassium 3.5 - 5.1 mmol/L 5.4(H) 4.8 3.8  Chloride 98 - 111 mmol/L 94(L) 92(L) 93(L)  CO2 22 - 32 mmol/L 30 34(H) 30  Calcium 8.9 - 10.3 mg/dL 9.8 9.3 9.0    EKG: is pending  Assessment & Plan by Problem: Active Problems:   Hemorrhage from arteriovenous dialysis graft [facility])  Ms. Reyanna Baley is a 44yr old female with PMHx of type I diabetes mellitus, ESRD on HD TTS, hypertension and hyperlipidemia presenting with L AV graft ulceration and bleeding.   AV graft ulceration and bleeding Patient with L AV fistula ulceration for one week duration noted to have bleeding from site following HD session today. This improved with direct pressure but patient sent to ED for further  evaluation. Patient evaluated by vascular surgery and plan for AV graft revision in OR in the morning.  - Vascular surgery consulted, appreciate their recommendations - Left AV graft revision in OR in AM  Diabetes mellitus type 1 Hypoglycemic episode Patient with history of type 1 diabetes mellitus. She uses insulin pump for glycemic control and is followed by [facility]. Per chart review, patient's A1c 6.3 in 08/2017. Noted to be hypoglycemic 3% of the time. Patient notes she usually has hypoglycemic episodes following dialysis session. She did have one episode of hypoglycemia to 40 with headache and blurry vision that resolved with food intake. She was also noted to have CBG of 50 on admission that resolved with food intake.  - CBG monitoring - Patient has insulin pump, may benefit from adjustment of insulin dosing by outpatient endocrinology - D50 prn for hypoglycemia   Abdominal distension Patient has significant abdominal distention on exam. She reports this as chronic. Denies abdominal pain and has no tenderness on exam.  Abnormal liver function and direct Bilirubinemia  -Doing abdominal US  -Monitor LFTs -No Hep panel in chart. Will check   ESRD on HD:  Hyperkalemia:  Patient with history of ESRD on HD TTS. Patient notes that she does not produce urine. She received session of HD today. She is on calcium acetate at home.  - Lokelma 10mg  once - CMP in the AM - Will consult nephrology in the AM to continue HD schedule on Sat - Strict I&O  Hypertension:  Systolic murmur:  Hypervolemia:  Patient has history of hypertension on amlodipine, labetalol and losartan  at home. However, she notes that her SBP is usually around 180-190 following dialysis. Noted to have elevated BNP on admission with bilateral pitting edema despite compliance with HD. May have component of HF. She did have Echo in 2015, but unable to see the results.  - Continuous cardiac monitoring -  Amlodipine 10mg  daily - Labetalol 300mg  bid - Losartan 25mg  daily  - Echo in AM  FEN/GI Diet: NPO after midnight  Electrolytes: Monitor and replete prn Fluids: None  DVT Prophylaxis: SCDs Code status: FULL    Dispo: Admit patient to Inpatient with expected length of stay greater than 2 midnights.  Signed: Harvie Heck, MD  Internal Medicine, PGY-1 10/20/2019, 8:27 PM  Pager: [phone]

## 2019-10-20 NOTE — ED Notes (Signed)
Bleeding from right upper fistula controlled at this time. No bleeding through dressing.

## 2019-10-20 NOTE — ED Triage Notes (Signed)
Pt in from dialysis. States she had bleeding from her graft and was instructed to come here to be seen by vascular for surgery. Currently bandaged with no active bleeding.  Pt has 42yo daughter with her, instructed pt that daughter will need to be picked up - cannot remain unattended while pt is in hospital.

## 2019-10-20 NOTE — ED Provider Notes (Signed)
[facility] EMERGENCY DEPARTMENT Provider Note   CSN: [id number] Arrival date & time: 10/20/19  1535     History Chief Complaint  Patient presents with  . Vascular Access Problem    [name] is a 44 y.o. female.  HPI Patient presents with bleeding and ulcer on her right AV fistula.  Discussed with Juanell Fairly from [facility].  States she witnessed the episode.  Had a known ulcer to AV fistula.  Was dialyzed today below the ulcer.  Had full dialysis but after dialysis had risk of bleeding from the ulcer site.  Now bleeding controlled after pressure.  Dr. Scot Dock had placed the fistula.  Patient is not on blood thinners.  Also reportedly had high blood pressure today.  Patient ate at [facility] right before coming here because her sugar had gone down to 40.    Past Medical History:  Diagnosis Date  . Anemia   . Arthritis   . Diabetes mellitus   . Diabetic retinopathy ([place])    Hx of laser Rx's  . DM type 1 (diabetes mellitus, type 1) ([place]) 09/12/2014   Age of onset for DM type 1 was age 17.     . Enlarged thyroid gland   . ESRD on hemodialysis ([place])    ESRD due to DM type I, age of onset DM 1 was age 45.  Went on dialysis in March 2012.  Gets HD now at [facility] on a TTS schedule.    Marland Kitchen ESRD on hemodialysis ([place]) 09/12/2014   ESRD due to DM type 1.  Started HD in 2012 at [facility].  Gets HD there on a TTS schedule.    Marland Kitchen GERD (gastroesophageal reflux disease)   . History of blood transfusion   . Hyperlipidemia   . Hypertension   . Peripheral vascular disease ([place])   . Pneumonia   . Stroke [facility])    facial drooping    Patient Active Problem List   Diagnosis Date Noted  . Hyperkalemia 04/15/2018  . Nausea 04/15/2018  . Cerebral infarction due to thrombosis of basilar artery ([place]) 03/11/2017  . Left hand pain 08/25/2016  . Hyperparathyroidism, secondary renal ([place])   . Benign essential HTN   . Pneumonia 09/12/2014  . CAP (community acquired  pneumonia) 09/12/2014  . ESRD on hemodialysis ([place]) 09/12/2014  . Diabetes mellitus type I ([place]) 09/12/2014  . History of diabetic retinopathy 09/12/2014  . HCAP (healthcare-associated pneumonia) 09/11/2014  . Cerebral infarction due to unspecified mechanism 05/22/2014  . Hypertensive emergency 05/22/2014  . Essential hypertension, benign 05/22/2014  . DM (diabetes mellitus) ([place]) 05/22/2014  . HLD (hyperlipidemia) 05/22/2014  . Cerebral infarction ([place]) 03/29/2014  . Facial droop 03/29/2014  . Mechanical complication of other vascular device, implant, and graft 01/12/2013  . Other complications due to renal dialysis device, implant, and graft 12/08/2012  . ESRD on hemodialysis ([place]) 04/18/2011    Past Surgical History:  Procedure Laterality Date  . ARTERIOVENOUS GRAFT PLACEMENT    . ARTERY REPAIR Left 12/10/2012   Procedure: BRACHIAL ARTERY REPAIR;  Surgeon: Angelia Mould, MD;  Location: [facility] OR;  Service: Vascular;  Laterality: Left;  Exploration Left Brachial Artery for AVF  . AV FISTULA PLACEMENT Right 01/11/2018   Procedure: INSERTION OF ARTERIOVENOUS (AV) GORE-TEX GRAFT  UPPER ARM;  Surgeon: Angelia Mould, MD;  Location: [facility];  Service: Vascular;  Laterality: Right;  . BASCILIC VEIN TRANSPOSITION Right 09/28/2017   Procedure: FIRST STAGE BASILIC VEIN TRANSPOSITION;  Surgeon: Angelia Mould, MD;  Location: [facility] OR;  Service: Vascular;  Laterality: Right;  . CESAREAN SECTION    . EYE SURGERY     LAzer  . EYE SURGERY Left   . REVISION OF ARTERIOVENOUS GORETEX GRAFT Left 08/01/2016   Procedure: REVISION OF ARTERIOVENOUS GORETEX GRAFT;  Surgeon: Angelia Mould, MD;  Location: [place];  Service: Vascular;  Laterality: Left;  . SHUNTOGRAM Left 11/30/2013   Procedure: SHUNTOGRAM;  Surgeon: Serafina Mitchell, MD;  Location: [facility] CATH LAB;  Service: Cardiovascular;  Laterality: Left;     OB History    Gravida  0   Para  0   Term  0   Preterm  0   AB  0    Living        SAB  0   TAB  0   Ectopic  0   Multiple      Live Births              Family History  Problem Relation Age of Onset  . Cancer Mother        breast  . Hypertension Father     Social History   Tobacco Use  . Smoking status: Never Smoker  . Smokeless tobacco: Never Used  Substance Use Topics  . Alcohol use: No    Alcohol/week: 0.0 standard drinks  . Drug use: No    Home Medications Prior to Admission medications   Medication Sig Start Date End Date Taking? Authorizing Provider  amLODipine (NORVASC) 10 MG tablet Take 10 mg by mouth at bedtime.    Yes [provider]  aspirin 81 MG EC tablet Take 81 mg by mouth at bedtime. Swallow whole.    Yes [provider]  calcium acetate (PHOSLO) 667 MG capsule Take 667 mg by mouth 3 (three) times daily with meals. May take an additional 667 mg dose with snacks   Yes [provider]  labetalol (NORMODYNE) 300 MG tablet Take 300 mg by mouth 3 (three) times daily with meals.    Yes [provider]  losartan (COZAAR) 25 MG tablet Take 25 mg by mouth in the morning and at bedtime.   Yes [provider]  rosuvastatin (CRESTOR) 10 MG tablet Take 10 mg by mouth at bedtime.    Yes [provider]  ethyl chloride spray Apply 1 application topically as needed (prior to port access).  07/27/17   [provider]  GLUCAGON EMERGENCY 1 MG injection Inject 1 mg into the muscle once as needed (for severe low blood sugar).  11/22/12   [provider]  insulin aspart (NOVOLOG) 100 UNIT/ML injection Inject 50 Units into the skin See admin instructions. Use with insulin pump daily 02/17/18   [provider]  Insulin Human (INSULIN PUMP) SOLN Inject into the skin continuous. Novolog   Basal rate 0.29 units/ hour    [provider]  loratadine (CLARITIN) 10 MG tablet Take 10 mg by mouth daily as needed for allergies.    [provider]  Pediatric  Multiple Vit-C-FA (FLINSTONES GUMMIES OMEGA-3 DHA) CHEW Chew 2 tablets by mouth at bedtime.     [provider]    Allergies    Pollen extract  Review of Systems   Review of Systems  Constitutional: Negative for appetite change.  HENT: Positive for congestion.   Respiratory: Negative for shortness of breath.   Cardiovascular: Negative for chest pain.  Gastrointestinal: Negative for abdominal pain.  Musculoskeletal:       Bleeding from dialysis fistula.  Neurological: Negative for light-headedness.  Psychiatric/Behavioral: Negative for confusion.    Physical Exam Updated Vital Signs BP (!) 181/74   Pulse 68   Temp 98 F (36.7 C) (Oral)   Resp 18   Ht 5\' 3"  (1.6 m)   Wt 57 kg   SpO2 98%   BMI 22.26 kg/m   Physical Exam Vitals reviewed.  HENT:     Head: Atraumatic.  Eyes:     Pupils: Pupils are equal, round, and reactive to light.  Cardiovascular:     Rate and Rhythm: Regular rhythm.  Pulmonary:     Effort: Pulmonary effort is normal.  Abdominal:     Tenderness: There is no abdominal tenderness.  Musculoskeletal:     Comments: Ulcer on lateral aspect of dialysis graft right upper extremity.  Has clot but no active bleed.  Skin:    General: Skin is warm.  Neurological:     Mental Status: She is alert and oriented to person, place, and time.     ED Results / Procedures / Treatments   Labs (all labs ordered are listed, but only abnormal results are displayed) Labs Reviewed  CBG MONITORING, ED - Abnormal; Notable for the following components:      Result Value   Glucose-Capillary 50 (*)    All other components within normal limits  RESPIRATORY PANEL BY RT PCR (FLU A&B, COVID)  CBC  BASIC METABOLIC PANEL    EKG None  Radiology No results found.  Procedures Procedures (including critical care time)  Medications Ordered in ED Medications  ceFAZolin (ANCEF) IVPB 1 g/50 mL premix (has no administration in time range)    ED Course  I have  reviewed the triage vital signs and the nursing notes.  Pertinent labs & imaging results that were available during my care of the patient were reviewed by me and considered in my medical decision making (see chart for details).    MDM Rules/Calculators/A&P                      Patient presents with ulcer on dialysis graft.  Will need surgery, however had food right before arrival due to hypoglycemia.  Sugar also decreased here.  Will not be an operative candidate until around 1 in the morning.  Seen by Dr. Carlis Abbott in the ER.  Will take tomorrow to the OR, however but thinks with hypoglycemia in the potential for bleeding would benefit from [place] to the hospital.  Will admit to unassigned medicine since the patient's primary is Dr. Vista Lawman. Final Clinical Impression(s) / ED Diagnoses Final diagnoses:  ESRD (end stage renal disease) ([place])  Hemorrhage from arteriovenous dialysis graft ([place])  Hypoglycemia    Rx / DC Orders ED Discharge Orders    None       Davonna Belling, MD 10/20/19 1654

## 2019-10-20 NOTE — Consult Note (Signed)
Hospital Consult    Reason for Consult: Bleeding ulceration on right upper arm AV loop graft Referring Physician: ED MRN #:  [id number]  History of Present Illness: This is a 44 y.o. female with history of type 1 diabetes, end-stage renal disease on dialysis Tuesday Thursday Saturday via right upper arm loop graft, hypertension, hyperlipidemia that vascular surgery has been consulted for bleeding right upper arm AV loop graft.  Patient was referred to the ED from her dialysis center after she had bleeding from a ulceration on the lateral limb of the loop graft.  She states this ulceration has been present for about 1 week.  She is currently dialyzing Tuesday Thursday and Saturday.  She has multiple failed accesses in the left upper extremity.  Her right upper arm loop graft was placed on 01/11/2018 by Dr. Scot Dock.  She states it has been working well otherwise.    Past Medical History:  Diagnosis Date  . Anemia   . Arthritis   . Diabetes mellitus   . Diabetic retinopathy ([place])    Hx of laser Rx's  . DM type 1 (diabetes mellitus, type 1) ([place]) 09/12/2014   Age of onset for DM type 1 was age 72.     . Enlarged thyroid gland   . ESRD on hemodialysis ([place])    ESRD due to DM type I, age of onset DM 1 was age 69.  Went on dialysis in March 2012.  Gets HD now at [facility] on a TTS schedule.    Marland Kitchen ESRD on hemodialysis ([place]) 09/12/2014   ESRD due to DM type 1.  Started HD in 2012 at [facility].  Gets HD there on a TTS schedule.    Marland Kitchen GERD (gastroesophageal reflux disease)   . History of blood transfusion   . Hyperlipidemia   . Hypertension   . Peripheral vascular disease ([place])   . Pneumonia   . Stroke [facility])    facial drooping    Past Surgical History:  Procedure Laterality Date  . ARTERIOVENOUS GRAFT PLACEMENT    . ARTERY REPAIR Left 12/10/2012   Procedure: BRACHIAL ARTERY REPAIR;  Surgeon: Angelia Mould, MD;  Location: [facility] OR;  Service: Vascular;  Laterality: Left;  Exploration  Left Brachial Artery for AVF  . AV FISTULA PLACEMENT Right 01/11/2018   Procedure: INSERTION OF ARTERIOVENOUS (AV) GORE-TEX GRAFT  UPPER ARM;  Surgeon: Angelia Mould, MD;  Location: [facility];  Service: Vascular;  Laterality: Right;  . BASCILIC VEIN TRANSPOSITION Right 09/28/2017   Procedure: FIRST STAGE BASILIC VEIN TRANSPOSITION;  Surgeon: Angelia Mould, MD;  Location: [place];  Service: Vascular;  Laterality: Right;  . CESAREAN SECTION    . EYE SURGERY     LAzer  . EYE SURGERY Left   . REVISION OF ARTERIOVENOUS GORETEX GRAFT Left 08/01/2016   Procedure: REVISION OF ARTERIOVENOUS GORETEX GRAFT;  Surgeon: Angelia Mould, MD;  Location: [facility] [place];  Service: Vascular;  Laterality: Left;  . SHUNTOGRAM Left 11/30/2013   Procedure: SHUNTOGRAM;  Surgeon: Serafina Mitchell, MD;  Location: [facility] CATH LAB;  Service: Cardiovascular;  Laterality: Left;    Allergies  Allergen Reactions  . Pollen Extract Other (See Comments)    Watery eyes    Prior to Admission medications   Medication Sig Start Date End Date Taking? Authorizing Provider  amLODipine (NORVASC) 10 MG tablet Take 10 mg by mouth at bedtime.     [provider]  aspirin 81 MG EC tablet Take 81  mg by mouth at bedtime. Swallow whole.     [provider]  calcium acetate (PHOSLO) 667 MG capsule Take 667 mg by mouth 3 (three) times daily with meals. May take an additional 667 mg dose with snacks    [provider]  cinacalcet (SENSIPAR) 30 MG tablet Take 30 mg by mouth See admin instructions. Taking only on Dialysis days Tue, Thur, Sat.    [provider]  ethyl chloride spray Apply 1 application topically as needed (prior to port access).  07/27/17   [provider]  GLUCAGON EMERGENCY 1 MG injection Inject 1 mg into the muscle once as needed (for severe low blood sugar).  11/22/12   [provider]  insulin aspart (NOVOLOG) 100 UNIT/ML injection Inject 50 Units into the skin See admin  instructions. Use with insulin pump daily 02/17/18   [provider]  Insulin Human (INSULIN PUMP) SOLN Inject into the skin continuous. Novolog   Basal rate 0.29 units/ hour    [provider]  labetalol (NORMODYNE) 300 MG tablet Take 300 mg by mouth 3 (three) times daily with meals.     [provider]  loratadine (CLARITIN) 10 MG tablet Take 10 mg by mouth daily as needed for allergies.    [provider]  Pediatric Multiple Vit-C-FA (FLINSTONES GUMMIES OMEGA-3 DHA) CHEW Chew 2 tablets by mouth at bedtime.     [provider]  rosuvastatin (CRESTOR) 10 MG tablet Take 10 mg by mouth at bedtime.     [provider]    Social History   Socioeconomic History  . Marital status: Single    Spouse name: Not on file  . Number of children: 1  . Years of education: BACHELOR'S  . Highest education level: Not on file  Occupational History  . Not on file  Tobacco Use  . Smoking status: Never Smoker  . Smokeless tobacco: Never Used  Substance and Sexual Activity  . Alcohol use: No    Alcohol/week: 0.0 standard drinks  . Drug use: No  . Sexual activity: Not Currently  Other Topics Concern  . Not on file  Social History Narrative   ** Merged History Encounter **       Patient is single with 1 child. Patient is right handed. Patient has BS degree. Patient drinks 0 caffeine.   Social Determinants of Health   Financial Resource Strain:   . Difficulty of Paying Living Expenses:   Food Insecurity:   . Worried About Charity fundraiser in the Last Year:   . Arboriculturist in the Last Year:   Transportation Needs:   . Film/video editor (Medical):   Marland Kitchen Lack of Transportation (Non-Medical):   Physical Activity:   . Days of Exercise per Week:   . Minutes of Exercise per Session:   Stress:   . Feeling of Stress :   Social Connections:   . Frequency of Communication with Friends and Family:   . Frequency of Social Gatherings with  Friends and Family:   . Attends Religious Services:   . Active Member of Clubs or Organizations:   . Attends Archivist Meetings:   Marland Kitchen Marital Status:   Intimate Partner Violence:   . Fear of Current or Ex-Partner:   . Emotionally Abused:   Marland Kitchen Physically Abused:   . Sexually Abused:      Family History  Problem Relation Age of Onset  . Cancer Mother  breast  . Hypertension Father     ROS: _0  Positive   _1  Negative   _2  All sytems reviewed and are negative  Cardiovascular: _3  chest pain/pressure _4  palpitations _5  SOB lying flat _6  DOE _7  pain in legs while walking _8  pain in legs at rest _9  pain in legs at night _10  non-healing ulcers _11  hx of DVT _12  swelling in legs  Pulmonary: _13  productive cough _14  asthma/wheezing _15  home O2  Neurologic: _16  weakness in _17  arms _18  legs _19  numbness in _20  arms _21  legs _22  hx of CVA _23  mini stroke _24 difficulty speaking or slurred speech _25  temporary loss of vision in one eye _26  dizziness  Hematologic: _27  hx of cancer _28  bleeding problems _29  problems with blood clotting easily  Endocrine:   _30  diabetes _31  thyroid disease  GI _32  vomiting blood _33  blood in stool  GU: _34  CKD/renal failure _35  HD--_36  M/W/F or _37  T/T/S _38  burning with urination _39  blood in urine  Psychiatric: _40  anxiety _41  depression  Musculoskeletal: _42  arthritis _43  joint pain  Integumentary: _44  rashes _45  ulcers - over right arm AVG  Constitutional: _46  fever _47  chills   Physical Examination  Vitals:   10/20/19 1539 10/20/19 1604  BP: (!) 188/91 (!) 181/74  Pulse: 69 68  Resp:  18  Temp: 98 F (36.7 C)   SpO2: 100% 98%   Body mass index is 22.26 kg/m.  General:  WDWN in NAD Gait: Not observed HENT: WNL, normocephalic Pulmonary: normal non-labored breathing, without Rales, rhonchi,  wheezing Cardiac: regular, without  Murmurs, rubs or gallops Abdomen: soft, NT/ND, no masses Vascular Exam/Pulses: Right upper arm  loop graft with good thrill.  There is an ulceration on the lateral segment that appears to be the arterial side of the graft based on old op notes. Extremities: without ischemic changes, without Gangrene , without cellulitis Musculoskeletal: no muscle wasting or atrophy  Neurologic: A&O X 3; Appropriate Affect ; SENSATION: normal; MOTOR FUNCTION:  moving all extremities equally. Speech is fluent/normal      CBC    Component Value Date/Time   WBC 5.3 04/16/2018 0434   RBC 4.01 04/16/2018 0434   HGB 10.4 (L) 04/16/2018 0434   HCT 33.7 (L) 04/16/2018 0434   PLT 190 04/16/2018 0434   MCV 84.0 04/16/2018 0434   MCH 25.9 (L) 04/16/2018 0434   MCHC 30.9 04/16/2018 0434   RDW 19.9 (H) 04/16/2018 0434   LYMPHSABS 0.7 04/15/2018 0944   MONOABS 0.1 04/15/2018 0944   EOSABS 0.1 04/15/2018 0944   BASOSABS 0.0 04/15/2018 0944    BMET    Component Value Date/Time   NA 138 04/16/2018 0434   K 4.8 04/16/2018 0434   CL 92 (L) 04/16/2018 0434   CO2 34 (H) 04/16/2018 0434   GLUCOSE 195 (H) 04/16/2018 0434   BUN 26 (H) 04/16/2018 0434   CREATININE 6.75 (H) 04/16/2018 0434   CALCIUM 9.3 04/16/2018 0434   CALCIUM 10.2 12/03/2009 1101   GFRNONAA 7 (L) 04/16/2018 0434   GFRAA 8 (L) 04/16/2018 0434    COAGS: Lab Results  Component Value Date   INR 1.15 04/16/2018   INR 1.04 03/29/2014     Non-Invasive Vascular Imaging:    None  ASSESSMENT/PLAN: This is a 44 y.o. female with type 1 diabetes as well as end-stage renal disease that presents with bleeding from ulcerated segment over right upper arm AV loop graft.  She had a bleeding episode from this ulcerated segment in dialysis  today and was referred to the ED.  I have recommended AV graft revision in the operating room tonight with planned interposition lateral to the ulceration.  I am hopeful we could do this without placing a TDC since this is a loop graft and she states they normally stick on medial side of graft.  Discussed  possibility of TDC.  Unfortunately she ate a cheeseburger and chicken nuggets and fries on the way to the emergency room and after discussing with anesthesia she will need to be n.p.o. for 8 hours even for MAC anesthesia.  As a result, I have recommended she be admitted to the hospitalist tonight for management of her hyporglycemia and other medical issues.  We will put her on the OR schedule for tomorrow morning.  She need a Covid test tonight.  Please keep her n.p.o. after midnight.  Basic labs to ensure her K is ok for OR.    Marty Heck, MD [facility] Office: [phone]

## 2019-10-20 NOTE — ED Notes (Signed)
NT transporting pt up to floor was stopped by transported. Per NT transporter to take pt to [place] then to floor. RN informed Floor RN

## 2019-10-21 ENCOUNTER — Inpatient Hospital Stay (HOSPITAL_BASED_OUTPATIENT_CLINIC_OR_DEPARTMENT_OTHER): Payer: Medicare HMO

## 2019-10-21 ENCOUNTER — Inpatient Hospital Stay (HOSPITAL_COMMUNITY): Payer: Medicare HMO | Admitting: Anesthesiology

## 2019-10-21 ENCOUNTER — Encounter (HOSPITAL_COMMUNITY)
Admission: EM | Disposition: A | Discharge status: Home / Self Care | Payer: Self-pay | Source: Home / Self Care | Attending: Emergency Medicine

## 2019-10-21 ENCOUNTER — Encounter (HOSPITAL_COMMUNITY): Payer: Self-pay | Admitting: Internal Medicine

## 2019-10-21 DIAGNOSIS — N186 End stage renal disease: Secondary | ICD-10-CM | POA: Diagnosis not present

## 2019-10-21 DIAGNOSIS — T82898A Other specified complication of vascular prosthetic devices, implants and grafts, initial encounter: Secondary | ICD-10-CM

## 2019-10-21 DIAGNOSIS — E1051 Type 1 diabetes mellitus with diabetic peripheral angiopathy without gangrene: Secondary | ICD-10-CM | POA: Diagnosis not present

## 2019-10-21 DIAGNOSIS — E8770 Fluid overload, unspecified: Secondary | ICD-10-CM | POA: Diagnosis not present

## 2019-10-21 DIAGNOSIS — Z20822 Contact with and (suspected) exposure to covid-19: Secondary | ICD-10-CM | POA: Diagnosis not present

## 2019-10-21 DIAGNOSIS — Z992 Dependence on renal dialysis: Secondary | ICD-10-CM | POA: Diagnosis not present

## 2019-10-21 DIAGNOSIS — R7401 Elevation of levels of liver transaminase levels: Secondary | ICD-10-CM

## 2019-10-21 DIAGNOSIS — Z7982 Long term (current) use of aspirin: Secondary | ICD-10-CM | POA: Diagnosis not present

## 2019-10-21 DIAGNOSIS — Z794 Long term (current) use of insulin: Secondary | ICD-10-CM | POA: Diagnosis not present

## 2019-10-21 DIAGNOSIS — I120 Hypertensive chronic kidney disease with stage 5 chronic kidney disease or end stage renal disease: Secondary | ICD-10-CM

## 2019-10-21 DIAGNOSIS — D696 Thrombocytopenia, unspecified: Secondary | ICD-10-CM

## 2019-10-21 DIAGNOSIS — T82838A Hemorrhage of vascular prosthetic devices, implants and grafts, initial encounter: Secondary | ICD-10-CM | POA: Diagnosis not present

## 2019-10-21 DIAGNOSIS — R188 Other ascites: Secondary | ICD-10-CM

## 2019-10-21 DIAGNOSIS — I361 Nonrheumatic tricuspid (valve) insufficiency: Secondary | ICD-10-CM

## 2019-10-21 DIAGNOSIS — E785 Hyperlipidemia, unspecified: Secondary | ICD-10-CM | POA: Diagnosis not present

## 2019-10-21 DIAGNOSIS — R011 Cardiac murmur, unspecified: Secondary | ICD-10-CM

## 2019-10-21 DIAGNOSIS — Z79899 Other long term (current) drug therapy: Secondary | ICD-10-CM | POA: Diagnosis not present

## 2019-10-21 DIAGNOSIS — E1022 Type 1 diabetes mellitus with diabetic chronic kidney disease: Secondary | ICD-10-CM | POA: Diagnosis not present

## 2019-10-21 HISTORY — PX: REVISION OF ARTERIOVENOUS GORETEX GRAFT: SHX6073

## 2019-10-21 LAB — GLUCOSE, CAPILLARY
Glucose-Capillary: 220 mg/dL — ABNORMAL HIGH (ref 70–99)
Glucose-Capillary: 149 mg/dL — ABNORMAL HIGH (ref 70–99)
Glucose-Capillary: 130 mg/dL — ABNORMAL HIGH (ref 70–99)
Glucose-Capillary: 116 mg/dL — ABNORMAL HIGH (ref 70–99)

## 2019-10-21 LAB — COMPREHENSIVE METABOLIC PANEL
ALT: 30 U/L (ref 0–44)
AST: 42 U/L — ABNORMAL HIGH (ref 15–41)
Albumin: 3.1 g/dL — ABNORMAL LOW (ref 3.5–5.0)
Alkaline Phosphatase: 446 U/L — ABNORMAL HIGH (ref 38–126)
Anion gap: 15 (ref 5–15)
BUN: 22 mg/dL — ABNORMAL HIGH (ref 6–20)
CO2: 30 mmol/L (ref 22–32)
Calcium: 9.2 mg/dL (ref 8.9–10.3)
Chloride: 95 mmol/L — ABNORMAL LOW (ref 98–111)
Creatinine, Ser: 5.12 mg/dL — ABNORMAL HIGH (ref 0.44–1.00)
GFR calc Af Amer: 11 mL/min — ABNORMAL LOW (ref >60)
GFR calc non Af Amer: 10 mL/min — ABNORMAL LOW (ref >60)
Glucose, Bld: 243 mg/dL — ABNORMAL HIGH (ref 70–99)
Potassium: 3.8 mmol/L (ref 3.5–5.1)
Sodium: 140 mmol/L (ref 135–145)
Total Bilirubin: 1.7 mg/dL — ABNORMAL HIGH (ref 0.3–1.2)
Total Protein: 7.5 g/dL (ref 6.5–8.1)

## 2019-10-21 LAB — HCG, QUANTITATIVE, PREGNANCY: hCG, Beta Chain, Quant, S: 3 m[IU]/mL (ref <5)

## 2019-10-21 LAB — HEPATITIS PANEL, ACUTE
HCV Ab: NONREACTIVE
Hep A IgM: NONREACTIVE
Hep B C IgM: NONREACTIVE
Hepatitis B Surface Ag: NONREACTIVE

## 2019-10-21 LAB — CBC
HCT: 34.8 % — ABNORMAL LOW (ref 36.0–46.0)
Hemoglobin: 11.3 g/dL — ABNORMAL LOW (ref 12.0–15.0)
MCH: 27.8 pg (ref 26.0–34.0)
MCHC: 32.5 g/dL (ref 30.0–36.0)
MCV: 85.5 fL (ref 80.0–100.0)
Platelets: 108 10*3/uL — ABNORMAL LOW (ref 150–400)
RBC: 4.07 MIL/uL (ref 3.87–5.11)
RDW: 20.5 % — ABNORMAL HIGH (ref 11.5–15.5)
WBC: 6.6 10*3/uL (ref 4.0–10.5)
nRBC: 0 % (ref 0.0–0.2)

## 2019-10-21 LAB — ECHOCARDIOGRAM COMPLETE
Height: 62.992 in
Weight: 2010.6 oz

## 2019-10-21 LAB — ACETAMINOPHEN LEVEL: Acetaminophen (Tylenol), Serum: <10 ug/mL — ABNORMAL LOW (ref 10–30)

## 2019-10-21 LAB — PROTIME-INR
INR: 1.1 (ref 0.8–1.2)
Prothrombin Time: 14.2 seconds (ref 11.4–15.2)

## 2019-10-21 LAB — TSH: TSH: 1.282 u[IU]/mL (ref 0.350–4.500)

## 2019-10-21 SURGERY — REVISION OF ARTERIOVENOUS GORETEX GRAFT
Anesthesia: General | Site: Arm Upper | Laterality: Right

## 2019-10-21 MED ORDER — FENTANYL CITRATE (PF) 250 MCG/5ML IJ SOLN
INTRAMUSCULAR | Status: AC
  Filled 2019-10-21: qty 5

## 2019-10-21 MED ORDER — DEXAMETHASONE SODIUM PHOSPHATE 10 MG/ML IJ SOLN
INTRAMUSCULAR | Status: DC | PRN
  Administered 2019-10-21: 4 mg via INTRAVENOUS

## 2019-10-21 MED ORDER — ONDANSETRON HCL 4 MG/2ML IJ SOLN
INTRAMUSCULAR | Status: DC | PRN
  Administered 2019-10-21: 4 mg via INTRAVENOUS

## 2019-10-21 MED ORDER — FENTANYL CITRATE (PF) 100 MCG/2ML IJ SOLN: 25.0000 ug | INTRAMUSCULAR | Status: DC | PRN

## 2019-10-21 MED ORDER — SODIUM CHLORIDE 0.9 % IV SOLN
INTRAVENOUS | Status: AC
  Filled 2019-10-21: qty 1.2

## 2019-10-21 MED ORDER — FENTANYL CITRATE (PF) 250 MCG/5ML IJ SOLN
INTRAMUSCULAR | Status: DC | PRN
  Administered 2019-10-21: 25 ug via INTRAVENOUS

## 2019-10-21 MED ORDER — OXYCODONE-ACETAMINOPHEN 7.5-325 MG PO TABS: 1.0000 | ORAL_TABLET | ORAL | 0 refills | Status: DC | PRN

## 2019-10-21 MED ORDER — LIDOCAINE HCL (PF) 1 % IJ SOLN
INTRAMUSCULAR | Status: AC
  Filled 2019-10-21: qty 30

## 2019-10-21 MED ORDER — ONDANSETRON HCL 4 MG/2ML IJ SOLN
INTRAMUSCULAR | Status: AC
  Filled 2019-10-21: qty 2

## 2019-10-21 MED ORDER — OXYCODONE-ACETAMINOPHEN 7.5-325 MG PO TABS: 1.0000 | ORAL_TABLET | ORAL | 0 refills | Status: AC | PRN

## 2019-10-21 MED ORDER — PROMETHAZINE HCL 25 MG/ML IJ SOLN: 6.2500 mg | INTRAMUSCULAR | Status: DC | PRN

## 2019-10-21 MED ORDER — DEXAMETHASONE SODIUM PHOSPHATE 10 MG/ML IJ SOLN
INTRAMUSCULAR | Status: AC
  Filled 2019-10-21: qty 1

## 2019-10-21 MED ORDER — 0.9 % SODIUM CHLORIDE (POUR BTL) OPTIME
TOPICAL | Status: DC | PRN
  Administered 2019-10-21: 10:00:00 1000 mL

## 2019-10-21 MED ORDER — LIDOCAINE 2% (20 MG/ML) 5 ML SYRINGE
INTRAMUSCULAR | Status: DC | PRN
  Administered 2019-10-21: 60 mg via INTRAVENOUS

## 2019-10-21 MED ORDER — LIDOCAINE 2% (20 MG/ML) 5 ML SYRINGE
INTRAMUSCULAR | Status: AC
  Filled 2019-10-21: qty 5

## 2019-10-21 MED ORDER — INSULIN PUMP
Freq: Three times a day (TID) | SUBCUTANEOUS | Status: DC
  Filled 2019-10-21: qty 1

## 2019-10-21 MED ORDER — SODIUM CHLORIDE 0.9 % IV SOLN: INTRAVENOUS | Status: DC

## 2019-10-21 MED ORDER — PHENYLEPHRINE 40 MCG/ML (10ML) SYRINGE FOR IV PUSH (FOR BLOOD PRESSURE SUPPORT)
PREFILLED_SYRINGE | INTRAVENOUS | Status: AC
  Filled 2019-10-21: qty 10

## 2019-10-21 MED ORDER — SODIUM CHLORIDE 0.9 % IV SOLN: INTRAVENOUS | Status: DC | PRN

## 2019-10-21 MED ORDER — HYDROCODONE-ACETAMINOPHEN 5-325 MG PO TABS
1.0000 | ORAL_TABLET | Freq: Four times a day (QID) | ORAL | Status: DC | PRN
  Administered 2019-10-21: 1 via ORAL
  Filled 2019-10-21: qty 1

## 2019-10-21 MED ORDER — HEMOSTATIC AGENTS (NO CHARGE) OPTIME
TOPICAL | Status: DC | PRN
  Administered 2019-10-21: 1 via TOPICAL

## 2019-10-21 MED ORDER — PROPOFOL 10 MG/ML IV BOLUS
INTRAVENOUS | Status: DC | PRN
  Administered 2019-10-21: 110 mg via INTRAVENOUS

## 2019-10-21 MED ORDER — SODIUM CHLORIDE 0.9 % IV SOLN
INTRAVENOUS | Status: DC | PRN
  Administered 2019-10-21: 500 mL

## 2019-10-21 SURGICAL SUPPLY — 64 items
BAG DECANTER FOR FLEXI CONT (MISCELLANEOUS) ×4 IMPLANT
BIOPATCH RED 1 DISK 7.0 (GAUZE/BANDAGES/DRESSINGS) IMPLANT
BIOPATCH RED 1IN DISK 7.0MM (GAUZE/BANDAGES/DRESSINGS)
BLADE SURG 11 STRL SS (BLADE) ×4 IMPLANT
CANISTER SUCT 3000ML PPV (MISCELLANEOUS) ×4 IMPLANT
CANNULA VESSEL 3MM 2 BLNT TIP (CANNULA) ×8 IMPLANT
CATH PALINDROME RT-P 15FX19CM (CATHETERS) IMPLANT
CATH PALINDROME RT-P 15FX23CM (CATHETERS) IMPLANT
CATH PALINDROME RT-P 15FX28CM (CATHETERS) IMPLANT
CATH PALINDROME RT-P 15FX55CM (CATHETERS) IMPLANT
CLIP LIGATING EXTRA MED SLVR (CLIP) ×4 IMPLANT
CLIP LIGATING EXTRA SM BLUE (MISCELLANEOUS) ×4 IMPLANT
COVER PROBE W GEL 5X96 (DRAPES) IMPLANT
COVER SURGICAL LIGHT HANDLE (MISCELLANEOUS) ×4 IMPLANT
COVER WAND RF STERILE (DRAPES) IMPLANT
DECANTER SPIKE VIAL GLASS SM (MISCELLANEOUS) ×4 IMPLANT
DERMABOND ADVANCED (GAUZE/BANDAGES/DRESSINGS) ×2
DERMABOND ADVANCED .7 DNX12 (GAUZE/BANDAGES/DRESSINGS) ×2 IMPLANT
DRAPE C-ARM 42X72 X-RAY (DRAPES) IMPLANT
DRAPE CHEST BREAST 15X10 FENES (DRAPES) IMPLANT
ELECT REM PT RETURN 9FT ADLT (ELECTROSURGICAL) ×4
ELECTRODE REM PT RTRN 9FT ADLT (ELECTROSURGICAL) ×2 IMPLANT
GAUZE 4X4 16PLY RFD (DISPOSABLE) ×8 IMPLANT
GLOVE SS BIOGEL STRL SZ 7.5 (GLOVE) ×2 IMPLANT
GLOVE SUPERSENSE BIOGEL SZ 7.5 (GLOVE) ×2
GOWN STRL REUS W/ TWL LRG LVL3 (GOWN DISPOSABLE) ×6 IMPLANT
GOWN STRL REUS W/TWL LRG LVL3 (GOWN DISPOSABLE) ×12
GRAFT GORETEX STRT 4-7X45 (Vascular Products) ×4 IMPLANT
HEMOSTAT SNOW SURGICEL 2X4 (HEMOSTASIS) ×4 IMPLANT
KIT BASIN OR (CUSTOM PROCEDURE TRAY) ×4 IMPLANT
KIT TURNOVER KIT B (KITS) ×4 IMPLANT
LOOP VESSEL MAXI BLUE (MISCELLANEOUS) ×8 IMPLANT
LOOP VESSEL MINI RED (MISCELLANEOUS) ×4 IMPLANT
NEEDLE 18GX1X1/2 (RX/OR ONLY) (NEEDLE) IMPLANT
NEEDLE 22X1 1/2 (OR ONLY) (NEEDLE) IMPLANT
NEEDLE HYPO 25GX1X1/2 BEV (NEEDLE) IMPLANT
NS IRRIG 1000ML POUR BTL (IV SOLUTION) ×4 IMPLANT
PACK CV ACCESS (CUSTOM PROCEDURE TRAY) IMPLANT
PACK GENERAL/GYN (CUSTOM PROCEDURE TRAY) ×4 IMPLANT
PACK SURGICAL SETUP 50X90 (CUSTOM PROCEDURE TRAY) ×4 IMPLANT
PAD ARMBOARD 7.5X6 YLW CONV (MISCELLANEOUS) ×4 IMPLANT
SOAP 2 % CHG 4 OZ (WOUND CARE) IMPLANT
STOCKINETTE 6  STRL (DRAPES) ×4
STOCKINETTE 6 STRL (DRAPES) ×2 IMPLANT
SUT ETHILON 3 0 PS 1 (SUTURE) IMPLANT
SUT PROLENE 6 0 CC (SUTURE) ×12 IMPLANT
SUT SILK 2 0 (SUTURE) ×4
SUT SILK 2 0 PERMA HAND 18 BK (SUTURE) ×4 IMPLANT
SUT SILK 2-0 18XBRD TIE 12 (SUTURE) ×2 IMPLANT
SUT SILK 3 0 (SUTURE) ×4
SUT SILK 3-0 18XBRD TIE 12 (SUTURE) ×2 IMPLANT
SUT SILK 4 0 (SUTURE) ×4
SUT SILK 4-0 18XBRD TIE 12 (SUTURE) ×2 IMPLANT
SUT VIC AB 3-0 SH 27 (SUTURE) ×8
SUT VIC AB 3-0 SH 27X BRD (SUTURE) ×4 IMPLANT
SUT VICRYL 4-0 PS2 18IN ABS (SUTURE) ×8 IMPLANT
SYR 10ML LL (SYRINGE) IMPLANT
SYR 20ML LL LF (SYRINGE) ×4 IMPLANT
SYR 5ML LL (SYRINGE) IMPLANT
SYR CONTROL 10ML LL (SYRINGE) IMPLANT
TOWEL GREEN STERILE (TOWEL DISPOSABLE) ×4 IMPLANT
TOWEL GREEN STERILE FF (TOWEL DISPOSABLE) IMPLANT
UNDERPAD 30X30 (UNDERPADS AND DIAPERS) ×4 IMPLANT
WATER STERILE IRR 1000ML POUR (IV SOLUTION) ×4 IMPLANT

## 2019-10-21 NOTE — Progress Notes (Addendum)
Inpatient Diabetes Program Recommendations  AACE/ADA: New Consensus Statement on Inpatient Glycemic Control   Target Ranges:  Prepandial:   less than 140 mg/dL      Peak postprandial:   less than 180 mg/dL (1-2 hours)      Critically ill patients:  140 - 180 mg/dL   Results for ROGAN, ECKLUND (MRN [id number]) as of 10/21/2019 10:37  Ref. Range 10/20/2019 16:27 10/20/2019 19:07 10/20/2019 23:50 10/21/2019 06:12 10/21/2019 08:11  Glucose-Capillary Latest Ref Range: 70 - 99 mg/dL 50 (L) 119 (H) 183 (H) 220 (H) 149 (H)   Review of Glycemic Control  Diabetes history: DM44 (dx at 44 years old; makes NO insulin so requires basal, correction, and carb coverage insulin) Outpatient Diabetes medications: T-slim insulin pump with Novolog (also uses Dexcom CGM) Current orders for Inpatient glycemic control: None  Inpatient Diabetes Program Recommendations:    Insulin Pump: Patient is currently using insulin pump for inpatient glycemic control. Please use Insulin Pump order set to order CBGs ACHS&2am and Insulin Pump ACHS&2am.  NOTE: Patient has Type 1 DM and uses an insulin pump for DM management. Patient is currently in [place] area. Per chart review noted patient sees Peri Jefferson, [place] (with [facility]) and was last seen 09/09/19. Per office note on 09/09/19 by Arlis Porta, PA the following should be current insulin pump settings:   Basal Rates: 12A 0.28 units/hr 3A 0.35 units/hr 9A 0.29 units/hr 7P 0.3 units/hr Total 24 hour Basal: 7.04 units/24 hours  Insulin to Carb Ratio: 12A 1:22 (1 unit covers 22 grams of carbs) 3A 1:26 (1 unit covers 26 grams of carbs) 9A 1:24 (1 unit covers 24 grams of carbs) 7P 1:23 (1 unit covers 23 grams of carbs)  Insulin Sensitivity: 1:85 (1 unit drops glucose 85 mg/dl)  Target Glucose: 120 mg/dl  Also noted that patient was hypoglycemic 3% of the time and A1C was 6.3%.  Noted that patient was told to call if hypoglycemia does not resolve or if worsens.  Patient  was initially hypoglycemic on 10/20/19 but glucose stable since then. Would recommend patient reach out to [facility] to ask about making further adjustments with insulin pump settings to prevent further issues with hypoglycemia.  Thanks, Barnie Alderman, RN, MSN, CDE Diabetes Coordinator Inpatient Diabetes Program [phone] (Team Pager from 8am to 5pm)

## 2019-10-21 NOTE — Progress Notes (Signed)
MOBILITY TEAM - Progress Note   10/21/19 1400  Mobility  Activity Ambulated in hall  Level of Assistance Independent  Assistive Device None  Distance Ambulated (ft) 480 ft  Mobility Response Tolerated well  Mobility performed by Mobility specialist   Patient hopeful for d/c home this afternoon.   Mabeline Caras, PT, DPT Mobility Team Pager [phone]

## 2019-10-21 NOTE — Progress Notes (Signed)
Pt discharged today to home by self.  Pt's IV's removed.  Pt taken off telemetry and CCMD notified.  Pt left with all of their personal belongings.  AVS documentation reviewed with Pt and all questions answered.

## 2019-10-21 NOTE — Discharge Instructions (Signed)
° °  [facility] ° °Discharge Instructions ° °AV Fistula or Graft Surgery for Dialysis Access ° °Please refer to the following instructions for your post-procedure care. Your surgeon or physician assistant will discuss any changes with you. ° °Activity ° °You may drive the day following your surgery, if you are comfortable and no longer taking prescription pain medication. Resume full activity as the soreness in your incision resolves. ° °Bathing/Showering ° °You may shower after you go home. Keep your incision dry for 48 hours. Do not soak in a bathtub, hot tub, or swim until the incision heals completely. You may not shower if you have a hemodialysis catheter. ° °Incision Care ° °Clean your incision with mild soap and water after 48 hours. Pat the area dry with a clean towel. You do not need a bandage unless otherwise instructed. Do not apply any ointments or creams to your incision. You may have skin glue on your incision. Do not peel it off. It will come off on its own in about one week. Your arm may swell a bit after surgery. To reduce swelling use pillows to elevate your arm so it is above your heart. Your doctor will tell you if you need to lightly wrap your arm with an ACE bandage. ° °Diet ° °Resume your normal diet. There are not special food restrictions following this procedure. In order to heal from your surgery, it is CRITICAL to get adequate nutrition. Your body requires vitamins, minerals, and protein. Vegetables are the best source of vitamins and minerals. Vegetables also provide the perfect balance of protein. Processed food has little nutritional value, so try to avoid this. ° °Medications ° °Resume taking all of your medications. If your incision is causing pain, you may take over-the counter pain relievers such as acetaminophen (Tylenol). If you were prescribed a stronger pain medication, please be aware these medications can cause nausea and constipation. Prevent  nausea by taking the medication with a snack or meal. Avoid constipation by drinking plenty of fluids and eating foods with high amount of fiber, such as fruits, vegetables, and grains. Do not take Tylenol if you are taking prescription pain medications. ° ° ° ° °Follow up °Your surgeon may want to see you in the office following your access surgery. If so, this will be arranged at the time of your surgery. ° °Please call us immediately for any of the following conditions: ° °Increased pain, redness, drainage (pus) from your incision site °Fever of 101 degrees or higher °Severe or worsening pain at your incision site °Hand pain or numbness. ° °Reduce your risk of vascular disease: ° °Stop smoking. If you would like help, call [facility] at 1-800-QUIT-NOW (1-800-784-8669) or [facility] at 336-586-4000 ° °Manage your cholesterol °Maintain a desired weight °Control your diabetes °Keep your blood pressure down ° °Dialysis ° °It will take several weeks to several months for your new dialysis access to be ready for use. Your surgeon will determine when it is OK to use it. Your nephrologist will continue to direct your dialysis. You can continue to use your Permcath until your new access is ready for use. ° °If you have any questions, please call the office at [phone]. ° °

## 2019-10-21 NOTE — Anesthesia Postprocedure Evaluation (Signed)
Anesthesia Post Note  Patient: [name]  Procedure(s) Performed: REVISION OF ARTERIOVENOUS GORETEX GRAFT ARM (Right Arm Upper)     Patient location during evaluation: PACU Anesthesia Type: General Level of consciousness: awake and alert, oriented and patient cooperative Pain management: pain level controlled Vital Signs Assessment: post-procedure vital signs reviewed and stable Respiratory status: spontaneous breathing, nonlabored ventilation and respiratory function stable Cardiovascular status: blood pressure returned to baseline and stable Postop Assessment: no apparent nausea or vomiting Anesthetic complications: no    Last Vitals:  Vitals:   10/21/19 1043 10/21/19 1131  BP:  (!) 168/75  Pulse: 66 67  Resp: 16 12  Temp: 36.6 C   SpO2: 96% 92%    Last Pain:  Vitals:   10/21/19 1131  TempSrc:   PainSc: 4                  Cagney Degrace,E. Kamiyah Kindel

## 2019-10-21 NOTE — Anesthesia Preprocedure Evaluation (Addendum)
Anesthesia Evaluation  Patient identified by MRN, date of birth, ID band Patient awake    Reviewed: Allergy & Precautions, NPO status , Patient's Chart, lab work & pertinent test results, reviewed documented beta blocker date and time   History of Anesthesia Complications Negative for: history of anesthetic complications  Airway Mallampati: II  TM Distance: >3 FB Neck ROM: Full    Dental no notable dental hx.    Pulmonary neg pulmonary ROS,    Pulmonary exam normal        Cardiovascular hypertension, Pt. on medications and Pt. on home beta blockers Normal cardiovascular exam  TTE 2015: EF 55-60%, valves essentially normal   Neuro/Psych CVA, Residual Symptoms negative psych ROS   GI/Hepatic Neg liver ROS, GERD  ,  Endo/Other  diabetes, Type 1, Insulin Dependent  Renal/GU ESRF and DialysisRenal disease (HD TTS)  negative genitourinary   Musculoskeletal  (+) Arthritis ,   Abdominal   Peds  Hematology  (+) anemia , Hgb 11.3, plts 108   Anesthesia Other Findings Day of surgery medications reviewed with patient.  Reproductive/Obstetrics negative OB ROS                            Anesthesia Physical Anesthesia Plan  ASA: III and emergent  Anesthesia Plan: General   Post-op Pain Management:    Induction:   PONV Risk Score and Plan: 3 and Treatment may vary due to age or medical condition, Propofol infusion and Ondansetron  Airway Management Planned: LMA  Additional Equipment: None  Intra-op Plan:   Post-operative Plan: Extubation in OR  Informed Consent: I have reviewed the patients History and Physical, chart, labs and discussed the procedure including the risks, benefits and alternatives for the proposed anesthesia with the patient or authorized representative who has indicated his/her understanding and acceptance.     Dental advisory given  Plan Discussed with:  CRNA  Anesthesia Plan Comments: (Pt with profuse active bleeding from right upper arm in preop. VSS. Patient alert and oriented, in no acute distress. Arm elevated and pressure applied enroute to OR. Approximately 200cc EBL on bed linens noted prior to [place], MD)       Anesthesia Quick Evaluation

## 2019-10-21 NOTE — Care Management Obs Status (Signed)
[place] NOTIFICATION   Patient Details  Name: [name] MRN: [id number] Date of Birth: [birth year]   Medicare Observation Status Notification Given:  Yes    Dawayne Patricia, RN 10/21/2019, 3:16 PM

## 2019-10-21 NOTE — Progress Notes (Signed)
Pt transferred from ED to 4E15. Appeared alerted and oriented x 4. Right upper arm AV fistula had pressure dressing dry and clean, no active bleeding, denied pain.  CHG bath given, call bell within reach, CCMD called and 2nd person verified EKG showed sinus rhythm on monitor.  HR 60s-70s, BP 179/112- 190/87 mmHg, RR14-18, Spo2 96-98% Temp 98-98.5 orally.   Pt presented with automatic Insulin pump and blood glucose monitoring using application connected to her smart phone. She signed contract agreement for insulin pump. Education about insulin pump and hospital visitation protocol given. Discussed plan of care, NPO for procedure at am. Consent already signed from ED. Pt expressed understanding and appreciations. She had no immediate distress noted. We will continue to monitor.  Kennyth Lose, RN

## 2019-10-21 NOTE — Progress Notes (Signed)
  Echocardiogram 2D Echocardiogram has been performed.  [name] 10/21/2019, 2:26 PM

## 2019-10-21 NOTE — Op Note (Signed)
    OPERATIVE REPORT  DATE OF SURGERY: 10/21/2019  PATIENT: [name], 44 y.o. female MRN: [id number]  DOB: [birth year]  PRE-OPERATIVE DIAGNOSIS: Disruption and ulceration over right upper arm AV loop Gore-Tex graft  POST-OPERATIVE DIAGNOSIS:  Same  PROCEDURE: Control of hemorrhage and replacement of arterial limb of right upper arm loop AV Gore-Tex graft  SURGEON:  Curt Jews, M.D.  PHYSICIAN ASSISTANT: Paul Half PA-C  ANESTHESIA: LMA  EBL: per anesthesia record  Total I/O In: 350 [I.V.:300; IV Piggyback:50] Out: 230 [Blood:230]  BLOOD ADMINISTERED: none  DRAINS: none  SPECIMEN: none  COUNTS CORRECT:  YES  PATIENT DISPOSITION:  PACU - hemodynamically stable  PROCEDURE DETAILS: Patient was taken from place to position.  The patient had a disruption of an ulcerated area of her arm with active bleeding.  This was controlled with digital pressure.  Her right arm and axilla were prepped and draped with digital control over the bleeding area.  The Gore-Tex graft was isolated over the arterial limb near the axilla and was occluded with a fistula clamp.  Digital pressure was released from the vein and hemostasis was obtained.  Next incision was made over the distal portion of the loop and the graft was encircled with a vessel loop and was occluded with a vascular clamp.  The Gore-Tex graft was transected above the arterial clamp and at the apex of the loop.  A new tunnel was created lateral to the old arterial limb of the graft which was disrupted.  This was a 7 mm Gore-Tex.  It was sewn into into the graft near the old arterial anastomosis and at the apex of the loop with 6-0 Prolene sutures.  Clamps removed.  There was a significant amount of needle hole bleeding which was controlled with time and topical agent.  The wounds were closed with 3-0 Vicryl in the subcutaneous and subcuticular tissue.  The area of the disruption was then controlled with an ellipse around the  disrupted area.  The graft was covered with 3-0 nylon sutures in a mattress fashion.  Sterile dressing was applied and the patient was transferred to the recovery in stable condition   Rosetta Posner, M.D., [facility] 10/21/2019 11:12 AM

## 2019-10-21 NOTE — Transfer of Care (Signed)
Immediate Anesthesia Transfer of Care Note  Patient: [name]  Procedure(s) Performed: REVISION OF ARTERIOVENOUS GORETEX GRAFT ARM (Right Arm Upper)  Patient Location: PACU  Anesthesia Type:General  Level of Consciousness: drowsy  Airway & Oxygen Therapy: Patient Spontanous Breathing and Patient connected to face mask oxygen  Post-op Assessment: Report given to RN and Post -op Vital signs reviewed and stable  Post vital signs: Reviewed and stable  Last Vitals:  Vitals Value Taken Time  BP 157/73 10/21/19 1023  Temp 36.6 C 10/21/19 1021  Pulse 67 10/21/19 1028  Resp 14 10/21/19 1028  SpO2 89 % 10/21/19 1028  Vitals shown include unvalidated device data.  Last Pain:  Vitals:   10/21/19 1021  TempSrc:   PainSc: (P) 0-No pain         Complications: No apparent anesthesia complications

## 2019-10-21 NOTE — Progress Notes (Addendum)
Subjective: HD#1  Overnight, no acute events reported.  This morning, patient evaluated at bedside. Pt states she slept well and feels good this AM. She says she is having her fistula revision around 8:30 AM today. She states the last time she ate was around 10-11PM last night. No other complaints at this time.   Objective:  Vital signs in last 24 hours: Vitals:   10/20/19 1900 10/20/19 2044 10/20/19 2331 10/21/19 0532  BP:  (!) 179/112 (!) 190/87 (!) 177/93  Pulse:  71 71 72  Resp: (!) 21 16 18 19   Temp:  98 F (36.7 C) 98.5 F (36.9 C) 97.8 F (36.6 C)  TempSrc:  Oral Oral Oral  SpO2:  96% 97% 93%  Weight:      Height:       CBC Latest Ref Rng & Units 10/21/2019 10/20/2019 04/16/2018  WBC 4.0 - 10.5 K/uL 6.6 5.1 5.3  Hemoglobin 12.0 - 15.0 g/dL 11.3(L) 13.3 10.4(L)  Hematocrit 36.0 - 46.0 % 34.8(L) 41.4 33.7(L)  Platelets 150 - 400 K/uL 108(L) 124(L) 190   CMP Latest Ref Rng & Units 10/21/2019 10/20/2019 04/16/2018  Glucose 70 - 99 mg/dL 243(H) 49(L) 195(H)  BUN 6 - 20 mg/dL 22(H) 16 26(H)  Creatinine 0.44 - 1.00 mg/dL 5.12(H) 3.81(H) 6.75(H)  Sodium 135 - 145 mmol/L 140 140 138  Potassium 3.5 - 5.1 mmol/L 3.8 5.4(H) 4.8  Chloride 98 - 111 mmol/L 95(L) 94(L) 92(L)  CO2 22 - 32 mmol/L 30 30 34(H)  Calcium 8.9 - 10.3 mg/dL 9.2 9.8 9.3  Total Protein 6.5 - 8.1 g/dL 7.5 7.5 6.9  Total Bilirubin 0.3 - 1.2 mg/dL 1.7(H) 1.6(H) 0.9  Alkaline Phos 38 - 126 U/L 446(H) 520(H) 60  AST 15 - 41 U/L 42(H) 77(H) 20  ALT 0 - 44 U/L 30 49(H) 26   RUQ [place] 10/20/2019: IMPRESSION: 1. Sludge in the gallbladder without evidence of acute cholecystitis. 2. Mild increased echogenicity of the liver may reflect hepatic steatosis. 3. Increased echogenicity and cortical thinning of both kidneys is consistent with medical renal disease. 4. Ascites.  Physical exam: Physical Exam  Constitutional: She is oriented to person, place, and time and well-developed, well-nourished, and in no distress.  HENT:   Head: Normocephalic and atraumatic.  Eyes: Conjunctivae and EOM are normal. Scleral icterus is present.  Cardiovascular: Normal rate, regular rhythm and intact distal pulses.  Murmur heard. Pulmonary/Chest: Effort normal and breath sounds normal. She has no wheezes. She has no rales.  Abdominal: Soft. Bowel sounds are normal. She exhibits distension. There is no abdominal tenderness.  Musculoskeletal:        General: Edema present. Normal range of motion.  Neurological: She is alert and oriented to person, place, and time.  Skin: Skin is warm and dry. She is not diaphoretic.    Assessment/Plan:  Ms. Shaquila Sigman is a 44 year old female with PMHx of type 1 diabetes mellitus, ESRD on HD TTS, hypertension and hyperlipidemia presenting with L AV graft ulceration and bleeding.   AV graft ulceration and bleeding: Patient for AV graft revision in OR this AM.  - Vascular surgery consulted, appreciate their recommendations   Diabetes mellitus type 1 CBGs 100-200 range with insulin pump overnight.  - CBG monitoring  - Continue current insulin regimen with patient's pump  Transaminitis w/abdominal distension Patient with continued abdominal distension but nontender to palpation and normoactive bowel sounds. RUQ [place] with GB sludge without evidence of acute cholecystitis and hepatic steatosis without  evidence of cirrhosis. LFTs improving and synthetic function of liver intact. Suspect this was secondary to hepatic congestion from volume overload. She would benefit from outpatient work up.  - F/u hepatitis panel - Continue to monitor as outpatient   ESRD on HD Patient on HD TTS. She had HD session yesterday. Suspect that her abdominal distension may be from not enough fluid removal during HD.  - Continue HD per schedule  - May need to consult nephrology for HD tomorrow if patient to stay in hospital following her procedure; otherwise can resume her HD schedule as outpatient pending vascular  surgery recommendations  Hypertension Systolic murmur Hypervolemia:  Patient has been hypertensive with SBP 170-180s during admission despite amlodipine, labetalol and losartan. Noted to have elevated BNP on admission with bilateral pitting edema despite compliance with HD. Suspect that she will need more fluid removal during HD. However, cannot rule out component of HF. Echo ordered. However, if unable to complete during hospitalization, will need as outpatient.  - Cardiac monitoring - Continue amlodipine 10mg  daily - Increase labetalol to 300mg  tid - Continue losartan 25mg  daily - F/u Echo  Thrombocytopenia: Plt 124>108 with bleeding from fistula site. Patient with BUN 22 so unlikely that this is from uremia.  - Continue to monitor  Diet: Renal  Electrolytes: Monitor and replete prn  Fluids: None  DVT Prophylaxis: SCDs Code status: FULL   Prior to Admission Living Arrangement: Home Anticipated Discharge Location: Home  Barriers to Discharge: Ongoing medical evaluation and treatment Dispo: Anticipated discharge in approximately 0-1 day(s).   Harvie Heck, MD  Internal Medicine, PGY-1 10/21/2019, 6:27 AM Pager: [phone]

## 2019-10-21 NOTE — Discharge Summary (Signed)
Name: [name] MRN: [id number] DOB: 1976-02-19 44 y.o. PCP: Benito Mccreedy, MD  Date of Admission: 10/20/2019  3:37 PM Date of Discharge: 10/21/2019 Attending Physician: Aldine Contes, MD  Discharge Diagnosis: 1. AV graft ulceration and bleeding 2. Hypervolemia  3. ESRD on HD 4. Hypertension  Discharge Medications: Allergies as of 10/21/2019      Reactions   Pollen Extract Other (See Comments)   Watery eyes      Medication List    TAKE these medications   amLODipine 10 MG tablet Commonly known as: NORVASC Take 10 mg by mouth at bedtime.   aspirin 81 MG EC tablet Take 81 mg by mouth at bedtime. Swallow whole.   Flinstones Gummies Omega-3 DHA Chew Chew 2 tablets by mouth in the morning.   Glucagon Emergency 1 MG Kit Inject 1 mg into the muscle once as needed (for severe low blood sugar).   insulin aspart 100 UNIT/ML injection Commonly known as: novoLOG Inject 50 Units into the skin See admin instructions. Use with insulin pump daily   insulin pump Soln Inject into the skin continuous. Novolog   Basal rate 0.29 units/ hour   labetalol 300 MG tablet Commonly known as: NORMODYNE Take 300 mg by mouth 3 (three) times daily with meals.   loratadine 10 MG tablet Commonly known as: CLARITIN Take 10 mg by mouth daily as needed for allergies.   losartan 25 MG tablet Commonly known as: COZAAR Take 25 mg by mouth in the morning and at bedtime.   oxyCODONE-acetaminophen 7.5-325 MG tablet Commonly known as: Percocet Take 1 tablet by mouth every 4 (four) hours as needed for up to 5 days for severe pain.   PhosLo 667 MG capsule Generic drug: calcium acetate Take 667 mg by mouth 3 (three) times daily with meals. May take an additional 667 mg dose with snacks   rosuvastatin 10 MG tablet Commonly known as: CRESTOR Take 10 mg by mouth at bedtime.       Disposition and follow-up:   [name] was discharged from [facility] in  Stable condition.  At the hospital follow up visit please address:  1.  AV graft ulceration and hemorrhage: s/p replacement of arterial limb of AV graft by Dr. Donnetta Hutching.   Hypervolemia:  ESRD on HD TTS: Patient noted to be hypervolemic on admission with bilateral lower extremity edema and ascites. Patient Discussed with Dr. Royce Macadamia, patient would benefit from fluid challenge at next dialysis session.   Hypertension: Patient with significant hypertension on three antihypertensive medications. Will need to f/u with PCP for BP management.   2.  Labs / imaging needed at time of follow-up: CMP, CBC  3.  Pending labs/ test needing follow-up: Echo  Follow-up Appointments: Follow-up Information    Early, Arvilla Meres, MD.   Specialties: Vascular Surgery, Cardiology Why: As needed Contact information: [place] [address] [phone]        Benito Mccreedy, MD. Schedule an appointment as soon as possible for a visit in 2 week(s).   Specialty: Internal Medicine Contact information: [address] [place] [address] [phone]        Corliss Parish, MD Follow up.   Specialty: Nephrology Contact information: [place] [address] [place] Hospital Course by problem list: 1. AV graft ulceration and bleeding:  Patient with R AV fistula ulceration for one week duration noted to have  bleeding from site following HD session on 4/1 that improved with direct pressure and patient sent to ED for further evaluation. On examination, noted to have bleeding from LUE fistula with palpable thrill below site of ulceration. Patient underwent replacement of arterial limb of right upper arm loop AV Gore-Tex graft with Dr. Donnetta Hutching on 4/2.   2. Hypervolemia:  ESRD on HD TTS:  Patient noted to have bilateral lower extremity edema of ankles with ascites. RUQ [place] with ascites and possible hepatic steatosis with gallbladder sludge. She was also  noted to have some elevation of LFTs with mild elevation of direct bilirubin suspected to be secondary to hepatic congestion. Synthetic function of liver wnl and hepatitis panel negative. Patient discussed with Dr. Royce Macadamia, nephrologist and recommended for fluid challenge at next dialysis session.  She is continue with her normal HD schedule and follow up with her PCP. She may benefit from monitoring of liver function.   3. Hypertension: Systolic murmur: Patient noted to be hypertensive with SBP 170-180s during admission despite resumption of home amlodipine, labetalol and losartan. Patient will benefit from adjustment of BP medications by PCP for better BP control. Echo performed during hospitalization for evaluation of murmur.   Discharge Vitals:   BP (!) 168/75 (BP Location: Left Leg)   Pulse 67   Temp 97.8 F (36.6 C)   Resp 12   Ht 5' 2.99" (1.6 m)   Wt 57 kg   SpO2 92%   BMI 22.27 kg/m   Pertinent Labs, Studies, and Procedures:  CBC Latest Ref Rng & Units 10/21/2019 10/20/2019 04/16/2018  WBC 4.0 - 10.5 K/uL 6.6 5.1 5.3  Hemoglobin 12.0 - 15.0 g/dL 11.3(L) 13.3 10.4(L)  Hematocrit 36.0 - 46.0 % 34.8(L) 41.4 33.7(L)  Platelets 150 - 400 K/uL 108(L) 124(L) 190   CMP Latest Ref Rng & Units 10/21/2019 10/20/2019 04/16/2018  Glucose 70 - 99 mg/dL 243(H) 49(L) 195(H)  BUN 6 - 20 mg/dL 22(H) 16 26(H)  Creatinine 0.44 - 1.00 mg/dL 5.12(H) 3.81(H) 6.75(H)  Sodium 135 - 145 mmol/L 140 140 138  Potassium 3.5 - 5.1 mmol/L 3.8 5.4(H) 4.8  Chloride 98 - 111 mmol/L 95(L) 94(L) 92(L)  CO2 22 - 32 mmol/L 30 30 34(H)  Calcium 8.9 - 10.3 mg/dL 9.2 9.8 9.3  Total Protein 6.5 - 8.1 g/dL 7.5 7.5 6.9  Total Bilirubin 0.3 - 1.2 mg/dL 1.7(H) 1.6(H) 0.9  Alkaline Phos 38 - 126 U/L 446(H) 520(H) 60  AST 15 - 41 U/L 42(H) 77(H) 20  ALT 0 - 44 U/L 30 49(H) 26   Hepatitis B Surface Ag NON REACTIVE NON REACTIVE   HCV Ab NON REACTIVE NON REACTIVE   Comment: (NOTE)  Nonreactive HCV antibody screen is  consistent with no HCV infections,  unless recent infection is suspected or other evidence exists to  indicate HCV infection.   Hep A IgM NON REACTIVE NON REACTIVE   Hep B C IgM NON REACTIVE NON REACTIVE    TSH 0.350 - 4.500 uIU/mL 1.282    Prothrombin Time 11.4 - 15.2 seconds 14.2   INR 0.8 - 1.2 1.1    RUQ [place] 10/20/2019: IMPRESSION: 1. Sludge in the gallbladder without evidence of acute cholecystitis. 2. Mild increased echogenicity of the liver may reflect hepatic steatosis. 3. Increased echogenicity and cortical thinning of both kidneys is consistent with medical renal disease. 4. Ascites.  Discharge Instructions: Discharge Instructions    Call MD for:  difficulty breathing, headache or visual disturbances   Complete by:  As directed    Call MD for:  extreme fatigue   Complete by: As directed    Call MD for:  hives   Complete by: As directed    Call MD for:  persistant dizziness or light-headedness   Complete by: As directed    Call MD for:  persistant nausea and vomiting   Complete by: As directed    Call MD for:  redness, tenderness, or signs of infection (pain, swelling, redness, odor or green/yellow discharge around incision site)   Complete by: As directed    Call MD for:  severe uncontrolled pain   Complete by: As directed    Call MD for:  temperature >100.4   Complete by: As directed    Diet - low sodium heart healthy   Complete by: As directed    Discharge instructions   Complete by: As directed    [name],  Thank you for allowing [place] to care for you during your hospital. You were admitted with bleeding of your AV fistula and underwent repair with Dr. Donnetta Hutching on 4/2.  During your admission, you were also noted to have fluid overload even after dialysis. You may benefit from having more volume removed during dialysis.  We also noted that your liver enzymes were elevated and your abdominal ultrasound had evidence of ascites (fluid in the abdomen). Please follow up  with your PCP for further evaluation of this.  You were also noted to be hypertensive throughout this admission. Please follow up with your PCP for adjustment of your BP medications.  On discharge, please continue to take your medications as prescribed. You may take Percocet as needed for pain control. Please continue your dialysis as scheduled.   Thank you!   Increase activity slowly   Complete by: As directed       Signed: Harvie Heck, MD  Internal Medicine, PGY-1 10/21/2019, 1:56 PM   Pager: [phone]

## 2019-10-21 NOTE — Anesthesia Procedure Notes (Signed)
Procedure Name: LMA Insertion Date/Time: 10/21/2019 8:35 AM Performed by: Renato Shin, CRNA Pre-anesthesia Checklist: Patient identified, Emergency Drugs available, Suction available and Patient being monitored Patient Re-evaluated:Patient Re-evaluated prior to induction Oxygen Delivery Method: Circle system utilized Preoxygenation: Pre-oxygenation with 100% oxygen Induction Type: IV induction LMA: LMA inserted LMA Size: 4.0 Number of attempts: 1 Placement Confirmation: positive ETCO2 and breath sounds checked- equal and bilateral Tube secured with: Tape Dental Injury: Teeth and Oropharynx as per pre-operative assessment

## 2019-10-21 NOTE — Care Management CC44 (Signed)
Condition Code 44 Documentation Completed  Patient Details  Name: [name] MRN: [id number] Date of Birth: 27-Jun-1976   Condition Code 44 given:  Yes Patient signature on Condition Code 44 notice:  Yes Documentation of 2 MD's agreement:  Yes Code 44 added to claim:  Yes    Dawayne Patricia, RN 10/21/2019, 3:16 PM

## 2019-10-22 DIAGNOSIS — N186 End stage renal disease: Secondary | ICD-10-CM | POA: Diagnosis not present

## 2019-10-22 DIAGNOSIS — N2581 Secondary hyperparathyroidism of renal origin: Secondary | ICD-10-CM | POA: Diagnosis not present

## 2019-10-22 DIAGNOSIS — D689 Coagulation defect, unspecified: Secondary | ICD-10-CM | POA: Diagnosis not present

## 2019-10-22 DIAGNOSIS — D631 Anemia in chronic kidney disease: Secondary | ICD-10-CM | POA: Diagnosis not present

## 2019-10-22 DIAGNOSIS — Z992 Dependence on renal dialysis: Secondary | ICD-10-CM | POA: Diagnosis not present

## 2019-10-22 DIAGNOSIS — D509 Iron deficiency anemia, unspecified: Secondary | ICD-10-CM | POA: Diagnosis not present

## 2019-10-22 DIAGNOSIS — E1029 Type 1 diabetes mellitus with other diabetic kidney complication: Secondary | ICD-10-CM | POA: Diagnosis not present

## 2019-10-22 DIAGNOSIS — R739 Hyperglycemia, unspecified: Secondary | ICD-10-CM | POA: Diagnosis not present

## 2019-10-24 ENCOUNTER — Encounter: Payer: Self-pay | Admitting: *Deleted

## 2019-10-26 DIAGNOSIS — I10 Essential (primary) hypertension: Secondary | ICD-10-CM | POA: Diagnosis not present

## 2019-10-26 DIAGNOSIS — N186 End stage renal disease: Secondary | ICD-10-CM | POA: Diagnosis not present

## 2019-10-26 DIAGNOSIS — E1165 Type 2 diabetes mellitus with hyperglycemia: Secondary | ICD-10-CM | POA: Diagnosis not present

## 2019-10-26 DIAGNOSIS — Z992 Dependence on renal dialysis: Secondary | ICD-10-CM | POA: Diagnosis not present

## 2019-10-26 DIAGNOSIS — Z0001 Encounter for general adult medical examination with abnormal findings: Secondary | ICD-10-CM | POA: Diagnosis not present

## 2019-10-26 DIAGNOSIS — Z136 Encounter for screening for cardiovascular disorders: Secondary | ICD-10-CM | POA: Diagnosis not present

## 2019-10-26 DIAGNOSIS — I119 Hypertensive heart disease without heart failure: Secondary | ICD-10-CM | POA: Diagnosis not present

## 2019-10-26 DIAGNOSIS — T82898A Other specified complication of vascular prosthetic devices, implants and grafts, initial encounter: Secondary | ICD-10-CM | POA: Diagnosis not present

## 2019-10-26 DIAGNOSIS — E782 Mixed hyperlipidemia: Secondary | ICD-10-CM | POA: Diagnosis not present

## 2019-10-26 DIAGNOSIS — E538 Deficiency of other specified B group vitamins: Secondary | ICD-10-CM | POA: Diagnosis not present

## 2019-10-26 DIAGNOSIS — Z1329 Encounter for screening for other suspected endocrine disorder: Secondary | ICD-10-CM | POA: Diagnosis not present

## 2019-10-26 DIAGNOSIS — D638 Anemia in other chronic diseases classified elsewhere: Secondary | ICD-10-CM | POA: Diagnosis not present

## 2019-10-27 DIAGNOSIS — R739 Hyperglycemia, unspecified: Secondary | ICD-10-CM | POA: Diagnosis not present

## 2019-10-27 DIAGNOSIS — D509 Iron deficiency anemia, unspecified: Secondary | ICD-10-CM | POA: Diagnosis not present

## 2019-10-27 DIAGNOSIS — D689 Coagulation defect, unspecified: Secondary | ICD-10-CM | POA: Diagnosis not present

## 2019-10-27 DIAGNOSIS — N186 End stage renal disease: Secondary | ICD-10-CM | POA: Diagnosis not present

## 2019-10-27 DIAGNOSIS — E1029 Type 1 diabetes mellitus with other diabetic kidney complication: Secondary | ICD-10-CM | POA: Diagnosis not present

## 2019-10-27 DIAGNOSIS — Z992 Dependence on renal dialysis: Secondary | ICD-10-CM | POA: Diagnosis not present

## 2019-10-27 DIAGNOSIS — D631 Anemia in chronic kidney disease: Secondary | ICD-10-CM | POA: Diagnosis not present

## 2019-10-27 DIAGNOSIS — N2581 Secondary hyperparathyroidism of renal origin: Secondary | ICD-10-CM | POA: Diagnosis not present

## 2019-10-29 DIAGNOSIS — D689 Coagulation defect, unspecified: Secondary | ICD-10-CM | POA: Diagnosis not present

## 2019-10-29 DIAGNOSIS — N186 End stage renal disease: Secondary | ICD-10-CM | POA: Diagnosis not present

## 2019-10-29 DIAGNOSIS — E1029 Type 1 diabetes mellitus with other diabetic kidney complication: Secondary | ICD-10-CM | POA: Diagnosis not present

## 2019-10-29 DIAGNOSIS — D631 Anemia in chronic kidney disease: Secondary | ICD-10-CM | POA: Diagnosis not present

## 2019-10-29 DIAGNOSIS — D509 Iron deficiency anemia, unspecified: Secondary | ICD-10-CM | POA: Diagnosis not present

## 2019-10-29 DIAGNOSIS — R739 Hyperglycemia, unspecified: Secondary | ICD-10-CM | POA: Diagnosis not present

## 2019-10-29 DIAGNOSIS — Z992 Dependence on renal dialysis: Secondary | ICD-10-CM | POA: Diagnosis not present

## 2019-10-29 DIAGNOSIS — N2581 Secondary hyperparathyroidism of renal origin: Secondary | ICD-10-CM | POA: Diagnosis not present

## 2019-10-31 DIAGNOSIS — E1022 Type 1 diabetes mellitus with diabetic chronic kidney disease: Secondary | ICD-10-CM | POA: Diagnosis not present

## 2019-11-01 DIAGNOSIS — R739 Hyperglycemia, unspecified: Secondary | ICD-10-CM | POA: Diagnosis not present

## 2019-11-01 DIAGNOSIS — N186 End stage renal disease: Secondary | ICD-10-CM | POA: Diagnosis not present

## 2019-11-01 DIAGNOSIS — Z992 Dependence on renal dialysis: Secondary | ICD-10-CM | POA: Diagnosis not present

## 2019-11-01 DIAGNOSIS — E1029 Type 1 diabetes mellitus with other diabetic kidney complication: Secondary | ICD-10-CM | POA: Diagnosis not present

## 2019-11-01 DIAGNOSIS — D631 Anemia in chronic kidney disease: Secondary | ICD-10-CM | POA: Diagnosis not present

## 2019-11-01 DIAGNOSIS — N2581 Secondary hyperparathyroidism of renal origin: Secondary | ICD-10-CM | POA: Diagnosis not present

## 2019-11-01 DIAGNOSIS — D689 Coagulation defect, unspecified: Secondary | ICD-10-CM | POA: Diagnosis not present

## 2019-11-01 DIAGNOSIS — D509 Iron deficiency anemia, unspecified: Secondary | ICD-10-CM | POA: Diagnosis not present

## 2019-11-03 DIAGNOSIS — D509 Iron deficiency anemia, unspecified: Secondary | ICD-10-CM | POA: Diagnosis not present

## 2019-11-03 DIAGNOSIS — E1029 Type 1 diabetes mellitus with other diabetic kidney complication: Secondary | ICD-10-CM | POA: Diagnosis not present

## 2019-11-03 DIAGNOSIS — D631 Anemia in chronic kidney disease: Secondary | ICD-10-CM | POA: Diagnosis not present

## 2019-11-03 DIAGNOSIS — N2581 Secondary hyperparathyroidism of renal origin: Secondary | ICD-10-CM | POA: Diagnosis not present

## 2019-11-03 DIAGNOSIS — R739 Hyperglycemia, unspecified: Secondary | ICD-10-CM | POA: Diagnosis not present

## 2019-11-03 DIAGNOSIS — D689 Coagulation defect, unspecified: Secondary | ICD-10-CM | POA: Diagnosis not present

## 2019-11-03 DIAGNOSIS — N186 End stage renal disease: Secondary | ICD-10-CM | POA: Diagnosis not present

## 2019-11-03 DIAGNOSIS — Z992 Dependence on renal dialysis: Secondary | ICD-10-CM | POA: Diagnosis not present

## 2019-11-05 DIAGNOSIS — D631 Anemia in chronic kidney disease: Secondary | ICD-10-CM | POA: Diagnosis not present

## 2019-11-05 DIAGNOSIS — D509 Iron deficiency anemia, unspecified: Secondary | ICD-10-CM | POA: Diagnosis not present

## 2019-11-05 DIAGNOSIS — Z992 Dependence on renal dialysis: Secondary | ICD-10-CM | POA: Diagnosis not present

## 2019-11-05 DIAGNOSIS — D689 Coagulation defect, unspecified: Secondary | ICD-10-CM | POA: Diagnosis not present

## 2019-11-05 DIAGNOSIS — N2581 Secondary hyperparathyroidism of renal origin: Secondary | ICD-10-CM | POA: Diagnosis not present

## 2019-11-05 DIAGNOSIS — N186 End stage renal disease: Secondary | ICD-10-CM | POA: Diagnosis not present

## 2019-11-05 DIAGNOSIS — E1029 Type 1 diabetes mellitus with other diabetic kidney complication: Secondary | ICD-10-CM | POA: Diagnosis not present

## 2019-11-05 DIAGNOSIS — R739 Hyperglycemia, unspecified: Secondary | ICD-10-CM | POA: Diagnosis not present

## 2019-11-08 DIAGNOSIS — D689 Coagulation defect, unspecified: Secondary | ICD-10-CM | POA: Diagnosis not present

## 2019-11-08 DIAGNOSIS — D509 Iron deficiency anemia, unspecified: Secondary | ICD-10-CM | POA: Diagnosis not present

## 2019-11-08 DIAGNOSIS — R739 Hyperglycemia, unspecified: Secondary | ICD-10-CM | POA: Diagnosis not present

## 2019-11-08 DIAGNOSIS — Z992 Dependence on renal dialysis: Secondary | ICD-10-CM | POA: Diagnosis not present

## 2019-11-08 DIAGNOSIS — E1029 Type 1 diabetes mellitus with other diabetic kidney complication: Secondary | ICD-10-CM | POA: Diagnosis not present

## 2019-11-08 DIAGNOSIS — N2581 Secondary hyperparathyroidism of renal origin: Secondary | ICD-10-CM | POA: Diagnosis not present

## 2019-11-08 DIAGNOSIS — D631 Anemia in chronic kidney disease: Secondary | ICD-10-CM | POA: Diagnosis not present

## 2019-11-08 DIAGNOSIS — N186 End stage renal disease: Secondary | ICD-10-CM | POA: Diagnosis not present

## 2019-11-10 DIAGNOSIS — E1029 Type 1 diabetes mellitus with other diabetic kidney complication: Secondary | ICD-10-CM | POA: Diagnosis not present

## 2019-11-10 DIAGNOSIS — D509 Iron deficiency anemia, unspecified: Secondary | ICD-10-CM | POA: Diagnosis not present

## 2019-11-10 DIAGNOSIS — D689 Coagulation defect, unspecified: Secondary | ICD-10-CM | POA: Diagnosis not present

## 2019-11-10 DIAGNOSIS — Z992 Dependence on renal dialysis: Secondary | ICD-10-CM | POA: Diagnosis not present

## 2019-11-10 DIAGNOSIS — N186 End stage renal disease: Secondary | ICD-10-CM | POA: Diagnosis not present

## 2019-11-10 DIAGNOSIS — D631 Anemia in chronic kidney disease: Secondary | ICD-10-CM | POA: Diagnosis not present

## 2019-11-10 DIAGNOSIS — R739 Hyperglycemia, unspecified: Secondary | ICD-10-CM | POA: Diagnosis not present

## 2019-11-10 DIAGNOSIS — N2581 Secondary hyperparathyroidism of renal origin: Secondary | ICD-10-CM | POA: Diagnosis not present

## 2019-11-12 DIAGNOSIS — R739 Hyperglycemia, unspecified: Secondary | ICD-10-CM | POA: Diagnosis not present

## 2019-11-12 DIAGNOSIS — N2581 Secondary hyperparathyroidism of renal origin: Secondary | ICD-10-CM | POA: Diagnosis not present

## 2019-11-12 DIAGNOSIS — D631 Anemia in chronic kidney disease: Secondary | ICD-10-CM | POA: Diagnosis not present

## 2019-11-12 DIAGNOSIS — D509 Iron deficiency anemia, unspecified: Secondary | ICD-10-CM | POA: Diagnosis not present

## 2019-11-12 DIAGNOSIS — N186 End stage renal disease: Secondary | ICD-10-CM | POA: Diagnosis not present

## 2019-11-12 DIAGNOSIS — Z992 Dependence on renal dialysis: Secondary | ICD-10-CM | POA: Diagnosis not present

## 2019-11-12 DIAGNOSIS — E1029 Type 1 diabetes mellitus with other diabetic kidney complication: Secondary | ICD-10-CM | POA: Diagnosis not present

## 2019-11-12 DIAGNOSIS — D689 Coagulation defect, unspecified: Secondary | ICD-10-CM | POA: Diagnosis not present

## 2019-11-15 DIAGNOSIS — Z992 Dependence on renal dialysis: Secondary | ICD-10-CM | POA: Diagnosis not present

## 2019-11-15 DIAGNOSIS — D689 Coagulation defect, unspecified: Secondary | ICD-10-CM | POA: Diagnosis not present

## 2019-11-15 DIAGNOSIS — D509 Iron deficiency anemia, unspecified: Secondary | ICD-10-CM | POA: Diagnosis not present

## 2019-11-15 DIAGNOSIS — N2581 Secondary hyperparathyroidism of renal origin: Secondary | ICD-10-CM | POA: Diagnosis not present

## 2019-11-15 DIAGNOSIS — E1029 Type 1 diabetes mellitus with other diabetic kidney complication: Secondary | ICD-10-CM | POA: Diagnosis not present

## 2019-11-15 DIAGNOSIS — N186 End stage renal disease: Secondary | ICD-10-CM | POA: Diagnosis not present

## 2019-11-15 DIAGNOSIS — R739 Hyperglycemia, unspecified: Secondary | ICD-10-CM | POA: Diagnosis not present

## 2019-11-15 DIAGNOSIS — D631 Anemia in chronic kidney disease: Secondary | ICD-10-CM | POA: Diagnosis not present

## 2019-11-17 DIAGNOSIS — D631 Anemia in chronic kidney disease: Secondary | ICD-10-CM | POA: Diagnosis not present

## 2019-11-17 DIAGNOSIS — E1029 Type 1 diabetes mellitus with other diabetic kidney complication: Secondary | ICD-10-CM | POA: Diagnosis not present

## 2019-11-17 DIAGNOSIS — D509 Iron deficiency anemia, unspecified: Secondary | ICD-10-CM | POA: Diagnosis not present

## 2019-11-17 DIAGNOSIS — D689 Coagulation defect, unspecified: Secondary | ICD-10-CM | POA: Diagnosis not present

## 2019-11-17 DIAGNOSIS — R739 Hyperglycemia, unspecified: Secondary | ICD-10-CM | POA: Diagnosis not present

## 2019-11-17 DIAGNOSIS — Z992 Dependence on renal dialysis: Secondary | ICD-10-CM | POA: Diagnosis not present

## 2019-11-17 DIAGNOSIS — N186 End stage renal disease: Secondary | ICD-10-CM | POA: Diagnosis not present

## 2019-11-17 DIAGNOSIS — N2581 Secondary hyperparathyroidism of renal origin: Secondary | ICD-10-CM | POA: Diagnosis not present

## 2019-11-18 DIAGNOSIS — Z992 Dependence on renal dialysis: Secondary | ICD-10-CM | POA: Diagnosis not present

## 2019-11-18 DIAGNOSIS — N186 End stage renal disease: Secondary | ICD-10-CM | POA: Diagnosis not present

## 2019-11-18 DIAGNOSIS — E1029 Type 1 diabetes mellitus with other diabetic kidney complication: Secondary | ICD-10-CM | POA: Diagnosis not present

## 2019-11-19 DIAGNOSIS — D689 Coagulation defect, unspecified: Secondary | ICD-10-CM | POA: Diagnosis not present

## 2019-11-19 DIAGNOSIS — Z992 Dependence on renal dialysis: Secondary | ICD-10-CM | POA: Diagnosis not present

## 2019-11-19 DIAGNOSIS — N186 End stage renal disease: Secondary | ICD-10-CM | POA: Diagnosis not present

## 2019-11-19 DIAGNOSIS — D631 Anemia in chronic kidney disease: Secondary | ICD-10-CM | POA: Diagnosis not present

## 2019-11-19 DIAGNOSIS — N2581 Secondary hyperparathyroidism of renal origin: Secondary | ICD-10-CM | POA: Diagnosis not present

## 2019-11-19 DIAGNOSIS — D509 Iron deficiency anemia, unspecified: Secondary | ICD-10-CM | POA: Diagnosis not present

## 2019-11-19 DIAGNOSIS — E1029 Type 1 diabetes mellitus with other diabetic kidney complication: Secondary | ICD-10-CM | POA: Diagnosis not present

## 2019-11-19 DIAGNOSIS — R739 Hyperglycemia, unspecified: Secondary | ICD-10-CM | POA: Diagnosis not present

## 2019-11-22 DIAGNOSIS — R739 Hyperglycemia, unspecified: Secondary | ICD-10-CM | POA: Diagnosis not present

## 2019-11-22 DIAGNOSIS — D631 Anemia in chronic kidney disease: Secondary | ICD-10-CM | POA: Diagnosis not present

## 2019-11-22 DIAGNOSIS — N2581 Secondary hyperparathyroidism of renal origin: Secondary | ICD-10-CM | POA: Diagnosis not present

## 2019-11-22 DIAGNOSIS — E1029 Type 1 diabetes mellitus with other diabetic kidney complication: Secondary | ICD-10-CM | POA: Diagnosis not present

## 2019-11-22 DIAGNOSIS — D509 Iron deficiency anemia, unspecified: Secondary | ICD-10-CM | POA: Diagnosis not present

## 2019-11-22 DIAGNOSIS — N186 End stage renal disease: Secondary | ICD-10-CM | POA: Diagnosis not present

## 2019-11-22 DIAGNOSIS — Z992 Dependence on renal dialysis: Secondary | ICD-10-CM | POA: Diagnosis not present

## 2019-11-22 DIAGNOSIS — D689 Coagulation defect, unspecified: Secondary | ICD-10-CM | POA: Diagnosis not present

## 2019-11-24 DIAGNOSIS — Z992 Dependence on renal dialysis: Secondary | ICD-10-CM | POA: Diagnosis not present

## 2019-11-24 DIAGNOSIS — D631 Anemia in chronic kidney disease: Secondary | ICD-10-CM | POA: Diagnosis not present

## 2019-11-24 DIAGNOSIS — R739 Hyperglycemia, unspecified: Secondary | ICD-10-CM | POA: Diagnosis not present

## 2019-11-24 DIAGNOSIS — D509 Iron deficiency anemia, unspecified: Secondary | ICD-10-CM | POA: Diagnosis not present

## 2019-11-24 DIAGNOSIS — N2581 Secondary hyperparathyroidism of renal origin: Secondary | ICD-10-CM | POA: Diagnosis not present

## 2019-11-24 DIAGNOSIS — D689 Coagulation defect, unspecified: Secondary | ICD-10-CM | POA: Diagnosis not present

## 2019-11-24 DIAGNOSIS — N186 End stage renal disease: Secondary | ICD-10-CM | POA: Diagnosis not present

## 2019-11-24 DIAGNOSIS — E1029 Type 1 diabetes mellitus with other diabetic kidney complication: Secondary | ICD-10-CM | POA: Diagnosis not present

## 2019-11-26 DIAGNOSIS — D689 Coagulation defect, unspecified: Secondary | ICD-10-CM | POA: Diagnosis not present

## 2019-11-26 DIAGNOSIS — Z992 Dependence on renal dialysis: Secondary | ICD-10-CM | POA: Diagnosis not present

## 2019-11-26 DIAGNOSIS — N186 End stage renal disease: Secondary | ICD-10-CM | POA: Diagnosis not present

## 2019-11-26 DIAGNOSIS — R739 Hyperglycemia, unspecified: Secondary | ICD-10-CM | POA: Diagnosis not present

## 2019-11-26 DIAGNOSIS — E1029 Type 1 diabetes mellitus with other diabetic kidney complication: Secondary | ICD-10-CM | POA: Diagnosis not present

## 2019-11-26 DIAGNOSIS — D509 Iron deficiency anemia, unspecified: Secondary | ICD-10-CM | POA: Diagnosis not present

## 2019-11-26 DIAGNOSIS — N2581 Secondary hyperparathyroidism of renal origin: Secondary | ICD-10-CM | POA: Diagnosis not present

## 2019-11-26 DIAGNOSIS — D631 Anemia in chronic kidney disease: Secondary | ICD-10-CM | POA: Diagnosis not present

## 2019-11-29 DIAGNOSIS — D631 Anemia in chronic kidney disease: Secondary | ICD-10-CM | POA: Diagnosis not present

## 2019-11-29 DIAGNOSIS — D689 Coagulation defect, unspecified: Secondary | ICD-10-CM | POA: Diagnosis not present

## 2019-11-29 DIAGNOSIS — E1029 Type 1 diabetes mellitus with other diabetic kidney complication: Secondary | ICD-10-CM | POA: Diagnosis not present

## 2019-11-29 DIAGNOSIS — N186 End stage renal disease: Secondary | ICD-10-CM | POA: Diagnosis not present

## 2019-11-29 DIAGNOSIS — D509 Iron deficiency anemia, unspecified: Secondary | ICD-10-CM | POA: Diagnosis not present

## 2019-11-29 DIAGNOSIS — R739 Hyperglycemia, unspecified: Secondary | ICD-10-CM | POA: Diagnosis not present

## 2019-11-29 DIAGNOSIS — Z992 Dependence on renal dialysis: Secondary | ICD-10-CM | POA: Diagnosis not present

## 2019-11-29 DIAGNOSIS — N2581 Secondary hyperparathyroidism of renal origin: Secondary | ICD-10-CM | POA: Diagnosis not present

## 2019-11-30 DIAGNOSIS — E1022 Type 1 diabetes mellitus with diabetic chronic kidney disease: Secondary | ICD-10-CM | POA: Diagnosis not present

## 2019-12-01 DIAGNOSIS — Z992 Dependence on renal dialysis: Secondary | ICD-10-CM | POA: Diagnosis not present

## 2019-12-01 DIAGNOSIS — D509 Iron deficiency anemia, unspecified: Secondary | ICD-10-CM | POA: Diagnosis not present

## 2019-12-01 DIAGNOSIS — D631 Anemia in chronic kidney disease: Secondary | ICD-10-CM | POA: Diagnosis not present

## 2019-12-01 DIAGNOSIS — N2581 Secondary hyperparathyroidism of renal origin: Secondary | ICD-10-CM | POA: Diagnosis not present

## 2019-12-01 DIAGNOSIS — R739 Hyperglycemia, unspecified: Secondary | ICD-10-CM | POA: Diagnosis not present

## 2019-12-01 DIAGNOSIS — D689 Coagulation defect, unspecified: Secondary | ICD-10-CM | POA: Diagnosis not present

## 2019-12-01 DIAGNOSIS — E1029 Type 1 diabetes mellitus with other diabetic kidney complication: Secondary | ICD-10-CM | POA: Diagnosis not present

## 2019-12-01 DIAGNOSIS — N186 End stage renal disease: Secondary | ICD-10-CM | POA: Diagnosis not present

## 2019-12-03 DIAGNOSIS — R739 Hyperglycemia, unspecified: Secondary | ICD-10-CM | POA: Diagnosis not present

## 2019-12-03 DIAGNOSIS — N186 End stage renal disease: Secondary | ICD-10-CM | POA: Diagnosis not present

## 2019-12-03 DIAGNOSIS — D631 Anemia in chronic kidney disease: Secondary | ICD-10-CM | POA: Diagnosis not present

## 2019-12-03 DIAGNOSIS — Z992 Dependence on renal dialysis: Secondary | ICD-10-CM | POA: Diagnosis not present

## 2019-12-03 DIAGNOSIS — E1029 Type 1 diabetes mellitus with other diabetic kidney complication: Secondary | ICD-10-CM | POA: Diagnosis not present

## 2019-12-03 DIAGNOSIS — D689 Coagulation defect, unspecified: Secondary | ICD-10-CM | POA: Diagnosis not present

## 2019-12-03 DIAGNOSIS — N2581 Secondary hyperparathyroidism of renal origin: Secondary | ICD-10-CM | POA: Diagnosis not present

## 2019-12-03 DIAGNOSIS — D509 Iron deficiency anemia, unspecified: Secondary | ICD-10-CM | POA: Diagnosis not present

## 2019-12-06 DIAGNOSIS — D689 Coagulation defect, unspecified: Secondary | ICD-10-CM | POA: Diagnosis not present

## 2019-12-06 DIAGNOSIS — Z992 Dependence on renal dialysis: Secondary | ICD-10-CM | POA: Diagnosis not present

## 2019-12-06 DIAGNOSIS — D631 Anemia in chronic kidney disease: Secondary | ICD-10-CM | POA: Diagnosis not present

## 2019-12-06 DIAGNOSIS — E1029 Type 1 diabetes mellitus with other diabetic kidney complication: Secondary | ICD-10-CM | POA: Diagnosis not present

## 2019-12-06 DIAGNOSIS — N186 End stage renal disease: Secondary | ICD-10-CM | POA: Diagnosis not present

## 2019-12-06 DIAGNOSIS — R739 Hyperglycemia, unspecified: Secondary | ICD-10-CM | POA: Diagnosis not present

## 2019-12-06 DIAGNOSIS — N2581 Secondary hyperparathyroidism of renal origin: Secondary | ICD-10-CM | POA: Diagnosis not present

## 2019-12-06 DIAGNOSIS — D509 Iron deficiency anemia, unspecified: Secondary | ICD-10-CM | POA: Diagnosis not present

## 2019-12-08 DIAGNOSIS — Z992 Dependence on renal dialysis: Secondary | ICD-10-CM | POA: Diagnosis not present

## 2019-12-08 DIAGNOSIS — D509 Iron deficiency anemia, unspecified: Secondary | ICD-10-CM | POA: Diagnosis not present

## 2019-12-08 DIAGNOSIS — D631 Anemia in chronic kidney disease: Secondary | ICD-10-CM | POA: Diagnosis not present

## 2019-12-08 DIAGNOSIS — N2581 Secondary hyperparathyroidism of renal origin: Secondary | ICD-10-CM | POA: Diagnosis not present

## 2019-12-08 DIAGNOSIS — D689 Coagulation defect, unspecified: Secondary | ICD-10-CM | POA: Diagnosis not present

## 2019-12-08 DIAGNOSIS — R739 Hyperglycemia, unspecified: Secondary | ICD-10-CM | POA: Diagnosis not present

## 2019-12-08 DIAGNOSIS — N186 End stage renal disease: Secondary | ICD-10-CM | POA: Diagnosis not present

## 2019-12-08 DIAGNOSIS — E1029 Type 1 diabetes mellitus with other diabetic kidney complication: Secondary | ICD-10-CM | POA: Diagnosis not present

## 2019-12-10 DIAGNOSIS — Z992 Dependence on renal dialysis: Secondary | ICD-10-CM | POA: Diagnosis not present

## 2019-12-10 DIAGNOSIS — D631 Anemia in chronic kidney disease: Secondary | ICD-10-CM | POA: Diagnosis not present

## 2019-12-10 DIAGNOSIS — N2581 Secondary hyperparathyroidism of renal origin: Secondary | ICD-10-CM | POA: Diagnosis not present

## 2019-12-10 DIAGNOSIS — E1029 Type 1 diabetes mellitus with other diabetic kidney complication: Secondary | ICD-10-CM | POA: Diagnosis not present

## 2019-12-10 DIAGNOSIS — D689 Coagulation defect, unspecified: Secondary | ICD-10-CM | POA: Diagnosis not present

## 2019-12-10 DIAGNOSIS — N186 End stage renal disease: Secondary | ICD-10-CM | POA: Diagnosis not present

## 2019-12-10 DIAGNOSIS — R739 Hyperglycemia, unspecified: Secondary | ICD-10-CM | POA: Diagnosis not present

## 2019-12-10 DIAGNOSIS — D509 Iron deficiency anemia, unspecified: Secondary | ICD-10-CM | POA: Diagnosis not present

## 2019-12-13 DIAGNOSIS — R739 Hyperglycemia, unspecified: Secondary | ICD-10-CM | POA: Diagnosis not present

## 2019-12-13 DIAGNOSIS — E1029 Type 1 diabetes mellitus with other diabetic kidney complication: Secondary | ICD-10-CM | POA: Diagnosis not present

## 2019-12-13 DIAGNOSIS — D509 Iron deficiency anemia, unspecified: Secondary | ICD-10-CM | POA: Diagnosis not present

## 2019-12-13 DIAGNOSIS — Z992 Dependence on renal dialysis: Secondary | ICD-10-CM | POA: Diagnosis not present

## 2019-12-13 DIAGNOSIS — N186 End stage renal disease: Secondary | ICD-10-CM | POA: Diagnosis not present

## 2019-12-13 DIAGNOSIS — D689 Coagulation defect, unspecified: Secondary | ICD-10-CM | POA: Diagnosis not present

## 2019-12-13 DIAGNOSIS — D631 Anemia in chronic kidney disease: Secondary | ICD-10-CM | POA: Diagnosis not present

## 2019-12-13 DIAGNOSIS — N2581 Secondary hyperparathyroidism of renal origin: Secondary | ICD-10-CM | POA: Diagnosis not present

## 2019-12-15 DIAGNOSIS — E1029 Type 1 diabetes mellitus with other diabetic kidney complication: Secondary | ICD-10-CM | POA: Diagnosis not present

## 2019-12-15 DIAGNOSIS — D631 Anemia in chronic kidney disease: Secondary | ICD-10-CM | POA: Diagnosis not present

## 2019-12-15 DIAGNOSIS — D689 Coagulation defect, unspecified: Secondary | ICD-10-CM | POA: Diagnosis not present

## 2019-12-15 DIAGNOSIS — N2581 Secondary hyperparathyroidism of renal origin: Secondary | ICD-10-CM | POA: Diagnosis not present

## 2019-12-15 DIAGNOSIS — R739 Hyperglycemia, unspecified: Secondary | ICD-10-CM | POA: Diagnosis not present

## 2019-12-15 DIAGNOSIS — D509 Iron deficiency anemia, unspecified: Secondary | ICD-10-CM | POA: Diagnosis not present

## 2019-12-15 DIAGNOSIS — N186 End stage renal disease: Secondary | ICD-10-CM | POA: Diagnosis not present

## 2019-12-15 DIAGNOSIS — Z992 Dependence on renal dialysis: Secondary | ICD-10-CM | POA: Diagnosis not present

## 2019-12-16 DIAGNOSIS — N186 End stage renal disease: Secondary | ICD-10-CM | POA: Diagnosis not present

## 2019-12-16 DIAGNOSIS — Z992 Dependence on renal dialysis: Secondary | ICD-10-CM | POA: Diagnosis not present

## 2019-12-16 DIAGNOSIS — E1022 Type 1 diabetes mellitus with diabetic chronic kidney disease: Secondary | ICD-10-CM | POA: Diagnosis not present

## 2019-12-16 DIAGNOSIS — Z9641 Presence of insulin pump (external) (internal): Secondary | ICD-10-CM | POA: Diagnosis not present

## 2019-12-16 DIAGNOSIS — E103593 Type 1 diabetes mellitus with proliferative diabetic retinopathy without macular edema, bilateral: Secondary | ICD-10-CM | POA: Diagnosis not present

## 2019-12-17 DIAGNOSIS — N186 End stage renal disease: Secondary | ICD-10-CM | POA: Diagnosis not present

## 2019-12-17 DIAGNOSIS — D689 Coagulation defect, unspecified: Secondary | ICD-10-CM | POA: Diagnosis not present

## 2019-12-17 DIAGNOSIS — E1029 Type 1 diabetes mellitus with other diabetic kidney complication: Secondary | ICD-10-CM | POA: Diagnosis not present

## 2019-12-17 DIAGNOSIS — D509 Iron deficiency anemia, unspecified: Secondary | ICD-10-CM | POA: Diagnosis not present

## 2019-12-17 DIAGNOSIS — R739 Hyperglycemia, unspecified: Secondary | ICD-10-CM | POA: Diagnosis not present

## 2019-12-17 DIAGNOSIS — N2581 Secondary hyperparathyroidism of renal origin: Secondary | ICD-10-CM | POA: Diagnosis not present

## 2019-12-17 DIAGNOSIS — Z992 Dependence on renal dialysis: Secondary | ICD-10-CM | POA: Diagnosis not present

## 2019-12-17 DIAGNOSIS — D631 Anemia in chronic kidney disease: Secondary | ICD-10-CM | POA: Diagnosis not present

## 2019-12-19 DIAGNOSIS — N186 End stage renal disease: Secondary | ICD-10-CM | POA: Diagnosis not present

## 2019-12-19 DIAGNOSIS — E1029 Type 1 diabetes mellitus with other diabetic kidney complication: Secondary | ICD-10-CM | POA: Diagnosis not present

## 2019-12-19 DIAGNOSIS — Z992 Dependence on renal dialysis: Secondary | ICD-10-CM | POA: Diagnosis not present

## 2019-12-20 DIAGNOSIS — D631 Anemia in chronic kidney disease: Secondary | ICD-10-CM | POA: Diagnosis not present

## 2019-12-20 DIAGNOSIS — N2581 Secondary hyperparathyroidism of renal origin: Secondary | ICD-10-CM | POA: Diagnosis not present

## 2019-12-20 DIAGNOSIS — Z992 Dependence on renal dialysis: Secondary | ICD-10-CM | POA: Diagnosis not present

## 2019-12-20 DIAGNOSIS — E1029 Type 1 diabetes mellitus with other diabetic kidney complication: Secondary | ICD-10-CM | POA: Diagnosis not present

## 2019-12-20 DIAGNOSIS — R739 Hyperglycemia, unspecified: Secondary | ICD-10-CM | POA: Diagnosis not present

## 2019-12-20 DIAGNOSIS — N186 End stage renal disease: Secondary | ICD-10-CM | POA: Diagnosis not present

## 2019-12-20 DIAGNOSIS — D689 Coagulation defect, unspecified: Secondary | ICD-10-CM | POA: Diagnosis not present

## 2019-12-20 DIAGNOSIS — D509 Iron deficiency anemia, unspecified: Secondary | ICD-10-CM | POA: Diagnosis not present

## 2019-12-21 ENCOUNTER — Ambulatory Visit (INDEPENDENT_AMBULATORY_CARE_PROVIDER_SITE_OTHER): Payer: Self-pay | Admitting: Physician Assistant

## 2019-12-21 ENCOUNTER — Other Ambulatory Visit: Payer: Self-pay

## 2019-12-21 ENCOUNTER — Ambulatory Visit (HOSPITAL_COMMUNITY)
Admission: RE | Admit: 2019-12-21 | Discharge: 2019-12-21 | Disposition: A | Discharge status: Home / Self Care | Payer: Medicare HMO | Source: Ambulatory Visit | Attending: Physician Assistant | Admitting: Physician Assistant

## 2019-12-21 VITALS — BP 158/77 | HR 84 | Temp 97.3°F | Resp 18 | Ht 62.0 in | Wt 123.0 lb

## 2019-12-21 DIAGNOSIS — Z992 Dependence on renal dialysis: Secondary | ICD-10-CM | POA: Insufficient documentation

## 2019-12-21 DIAGNOSIS — N186 End stage renal disease: Secondary | ICD-10-CM | POA: Insufficient documentation

## 2019-12-21 NOTE — Progress Notes (Signed)
POST OPERATIVE OFFICE NOTE    CC:  F/u for surgery  HPI:  This is a 44 y.o. female who is s/p Control of hemorrhage and replacement of arterial limb of right upper arm loop AV Gore-Tex graft for disruption and ulceration over RUA AV loop graft on 10/21/2019 by Dr. Donnetta Hutching.  Pt states she does not have pain/numbness in her right  hand.    She states the aneurysmal area came up about 3 weeks ago.  It is not painful, it has not bled.  She is currently dialyzing via catheter.   The pt is on dialysis T/T/S at [facility].  Allergies  Allergen Reactions   Pollen Extract Other (See Comments)    Watery eyes    Current Outpatient Medications  Medication Sig Dispense Refill   amLODipine (NORVASC) 10 MG tablet Take 10 mg by mouth at bedtime.      aspirin 81 MG EC tablet Take 81 mg by mouth at bedtime. Swallow whole.      calcium acetate (PHOSLO) 667 MG capsule Take 667 mg by mouth 3 (three) times daily with meals. May take an additional 667 mg dose with snacks     GLUCAGON EMERGENCY 1 MG injection Inject 1 mg into the muscle once as needed (for severe low blood sugar).      insulin aspart (NOVOLOG) 100 UNIT/ML injection Inject 50 Units into the skin See admin instructions. Use with insulin pump daily     Insulin Human (INSULIN PUMP) SOLN Inject into the skin continuous. Novolog   Basal rate 0.29 units/ hour     labetalol (NORMODYNE) 300 MG tablet Take 300 mg by mouth 3 (three) times daily with meals.      loratadine (CLARITIN) 10 MG tablet Take 10 mg by mouth daily as needed for allergies.     losartan (COZAAR) 25 MG tablet Take 25 mg by mouth in the morning and at bedtime.     Pediatric Multiple Vit-C-FA (FLINSTONES GUMMIES OMEGA-3 DHA) CHEW Chew 2 tablets by mouth in the morning.      rosuvastatin (CRESTOR) 10 MG tablet Take 10 mg by mouth at bedtime.      No current facility-administered medications for this visit.     ROS:  See HPI  Physical Exam:  Today's Vitals   12/21/19  0821  BP: (!) 158/77  Pulse: 84  Resp: 18  Temp: (!) 97.3 F (36.3 C)  TempSrc: Oral  SpO2: 95%  Weight: 123 lb (55.8 kg)  Height: 5\' 2"  (1.575 m)   Body mass index is 22.5 kg/m.   Incision:  Well healed Extremities:  There is a palpable right radial pulse.  Motor and sensory are in tact.  There is a thrill/bruit present.  She does have an aneurysmal area proximal at the incision in the axilla.    Dialysis Duplex on 12/21/2019:  Old non functioning graft seen in upper arm. Aproximately 5 cm distal to  the arterial anastomosis, there appears to be perigraft hematoma  surrounding the arterial limb. No active bleeding is seen within this  hematoma.     Summary:  Patent arteriovenous graft.  Perigraft hematoma identified adjacent to arterial limb of AVGG  aproximately 5 cm distal to the arterial anastomosis.   Assessment/Plan:  This is a 44 y.o. female who is s/p:  Control of hemorrhage and replacement of arterial limb of right upper arm loop AV Gore-Tex graft for disruption and ulceration over RUA AV loop graft on 10/21/2019 by Dr. Donnetta Hutching.    -  pt's RUA AVG is patent.  She does have a new aneurysmal area proximally near the axillary incision.  A duplex today reveals a perigraft hematoma adjacent to arterial limb of AVG ~ 5cm distal to the arterial anastomosis & no active bleeding is seen within this hematoma. -would go ahead and continue to use graft and when it has been used successfully to the satisfaction of the dialysis center, the tunneled catheter can be removed at their discretion.   -the pt does not have evidence of steal. -If pt has tunneled dialysis catheter and the access has been -the pt will follow up as needed.   Leontine Locket, [facility] [phone]  Clinic MD:  Oneida Alar

## 2019-12-22 DIAGNOSIS — N186 End stage renal disease: Secondary | ICD-10-CM | POA: Diagnosis not present

## 2019-12-22 DIAGNOSIS — D631 Anemia in chronic kidney disease: Secondary | ICD-10-CM | POA: Diagnosis not present

## 2019-12-22 DIAGNOSIS — D689 Coagulation defect, unspecified: Secondary | ICD-10-CM | POA: Diagnosis not present

## 2019-12-22 DIAGNOSIS — Z992 Dependence on renal dialysis: Secondary | ICD-10-CM | POA: Diagnosis not present

## 2019-12-22 DIAGNOSIS — N2581 Secondary hyperparathyroidism of renal origin: Secondary | ICD-10-CM | POA: Diagnosis not present

## 2019-12-22 DIAGNOSIS — R739 Hyperglycemia, unspecified: Secondary | ICD-10-CM | POA: Diagnosis not present

## 2019-12-22 DIAGNOSIS — E1029 Type 1 diabetes mellitus with other diabetic kidney complication: Secondary | ICD-10-CM | POA: Diagnosis not present

## 2019-12-22 DIAGNOSIS — D509 Iron deficiency anemia, unspecified: Secondary | ICD-10-CM | POA: Diagnosis not present

## 2019-12-24 DIAGNOSIS — N2581 Secondary hyperparathyroidism of renal origin: Secondary | ICD-10-CM | POA: Diagnosis not present

## 2019-12-24 DIAGNOSIS — D509 Iron deficiency anemia, unspecified: Secondary | ICD-10-CM | POA: Diagnosis not present

## 2019-12-24 DIAGNOSIS — D631 Anemia in chronic kidney disease: Secondary | ICD-10-CM | POA: Diagnosis not present

## 2019-12-24 DIAGNOSIS — N186 End stage renal disease: Secondary | ICD-10-CM | POA: Diagnosis not present

## 2019-12-24 DIAGNOSIS — E1029 Type 1 diabetes mellitus with other diabetic kidney complication: Secondary | ICD-10-CM | POA: Diagnosis not present

## 2019-12-24 DIAGNOSIS — Z992 Dependence on renal dialysis: Secondary | ICD-10-CM | POA: Diagnosis not present

## 2019-12-24 DIAGNOSIS — R739 Hyperglycemia, unspecified: Secondary | ICD-10-CM | POA: Diagnosis not present

## 2019-12-24 DIAGNOSIS — D689 Coagulation defect, unspecified: Secondary | ICD-10-CM | POA: Diagnosis not present

## 2019-12-27 DIAGNOSIS — D631 Anemia in chronic kidney disease: Secondary | ICD-10-CM | POA: Diagnosis not present

## 2019-12-27 DIAGNOSIS — N2581 Secondary hyperparathyroidism of renal origin: Secondary | ICD-10-CM | POA: Diagnosis not present

## 2019-12-27 DIAGNOSIS — N186 End stage renal disease: Secondary | ICD-10-CM | POA: Diagnosis not present

## 2019-12-27 DIAGNOSIS — D509 Iron deficiency anemia, unspecified: Secondary | ICD-10-CM | POA: Diagnosis not present

## 2019-12-27 DIAGNOSIS — E1029 Type 1 diabetes mellitus with other diabetic kidney complication: Secondary | ICD-10-CM | POA: Diagnosis not present

## 2019-12-27 DIAGNOSIS — Z992 Dependence on renal dialysis: Secondary | ICD-10-CM | POA: Diagnosis not present

## 2019-12-27 DIAGNOSIS — R739 Hyperglycemia, unspecified: Secondary | ICD-10-CM | POA: Diagnosis not present

## 2019-12-27 DIAGNOSIS — D689 Coagulation defect, unspecified: Secondary | ICD-10-CM | POA: Diagnosis not present

## 2019-12-29 DIAGNOSIS — Z992 Dependence on renal dialysis: Secondary | ICD-10-CM | POA: Diagnosis not present

## 2019-12-29 DIAGNOSIS — D509 Iron deficiency anemia, unspecified: Secondary | ICD-10-CM | POA: Diagnosis not present

## 2019-12-29 DIAGNOSIS — E1029 Type 1 diabetes mellitus with other diabetic kidney complication: Secondary | ICD-10-CM | POA: Diagnosis not present

## 2019-12-29 DIAGNOSIS — D689 Coagulation defect, unspecified: Secondary | ICD-10-CM | POA: Diagnosis not present

## 2019-12-29 DIAGNOSIS — R739 Hyperglycemia, unspecified: Secondary | ICD-10-CM | POA: Diagnosis not present

## 2019-12-29 DIAGNOSIS — D631 Anemia in chronic kidney disease: Secondary | ICD-10-CM | POA: Diagnosis not present

## 2019-12-29 DIAGNOSIS — N2581 Secondary hyperparathyroidism of renal origin: Secondary | ICD-10-CM | POA: Diagnosis not present

## 2019-12-29 DIAGNOSIS — N186 End stage renal disease: Secondary | ICD-10-CM | POA: Diagnosis not present

## 2019-12-31 DIAGNOSIS — D509 Iron deficiency anemia, unspecified: Secondary | ICD-10-CM | POA: Diagnosis not present

## 2019-12-31 DIAGNOSIS — D631 Anemia in chronic kidney disease: Secondary | ICD-10-CM | POA: Diagnosis not present

## 2019-12-31 DIAGNOSIS — R739 Hyperglycemia, unspecified: Secondary | ICD-10-CM | POA: Diagnosis not present

## 2019-12-31 DIAGNOSIS — E1029 Type 1 diabetes mellitus with other diabetic kidney complication: Secondary | ICD-10-CM | POA: Diagnosis not present

## 2019-12-31 DIAGNOSIS — N186 End stage renal disease: Secondary | ICD-10-CM | POA: Diagnosis not present

## 2019-12-31 DIAGNOSIS — Z992 Dependence on renal dialysis: Secondary | ICD-10-CM | POA: Diagnosis not present

## 2019-12-31 DIAGNOSIS — N2581 Secondary hyperparathyroidism of renal origin: Secondary | ICD-10-CM | POA: Diagnosis not present

## 2019-12-31 DIAGNOSIS — D689 Coagulation defect, unspecified: Secondary | ICD-10-CM | POA: Diagnosis not present

## 2020-01-03 DIAGNOSIS — R739 Hyperglycemia, unspecified: Secondary | ICD-10-CM | POA: Diagnosis not present

## 2020-01-03 DIAGNOSIS — D689 Coagulation defect, unspecified: Secondary | ICD-10-CM | POA: Diagnosis not present

## 2020-01-03 DIAGNOSIS — Z992 Dependence on renal dialysis: Secondary | ICD-10-CM | POA: Diagnosis not present

## 2020-01-03 DIAGNOSIS — D509 Iron deficiency anemia, unspecified: Secondary | ICD-10-CM | POA: Diagnosis not present

## 2020-01-03 DIAGNOSIS — N186 End stage renal disease: Secondary | ICD-10-CM | POA: Diagnosis not present

## 2020-01-03 DIAGNOSIS — N2581 Secondary hyperparathyroidism of renal origin: Secondary | ICD-10-CM | POA: Diagnosis not present

## 2020-01-03 DIAGNOSIS — D631 Anemia in chronic kidney disease: Secondary | ICD-10-CM | POA: Diagnosis not present

## 2020-01-03 DIAGNOSIS — E1029 Type 1 diabetes mellitus with other diabetic kidney complication: Secondary | ICD-10-CM | POA: Diagnosis not present

## 2020-01-04 DIAGNOSIS — Z452 Encounter for adjustment and management of vascular access device: Secondary | ICD-10-CM | POA: Diagnosis not present

## 2020-01-05 DIAGNOSIS — D689 Coagulation defect, unspecified: Secondary | ICD-10-CM | POA: Diagnosis not present

## 2020-01-05 DIAGNOSIS — D509 Iron deficiency anemia, unspecified: Secondary | ICD-10-CM | POA: Diagnosis not present

## 2020-01-05 DIAGNOSIS — N186 End stage renal disease: Secondary | ICD-10-CM | POA: Diagnosis not present

## 2020-01-05 DIAGNOSIS — E1029 Type 1 diabetes mellitus with other diabetic kidney complication: Secondary | ICD-10-CM | POA: Diagnosis not present

## 2020-01-05 DIAGNOSIS — D631 Anemia in chronic kidney disease: Secondary | ICD-10-CM | POA: Diagnosis not present

## 2020-01-05 DIAGNOSIS — R739 Hyperglycemia, unspecified: Secondary | ICD-10-CM | POA: Diagnosis not present

## 2020-01-05 DIAGNOSIS — N2581 Secondary hyperparathyroidism of renal origin: Secondary | ICD-10-CM | POA: Diagnosis not present

## 2020-01-05 DIAGNOSIS — Z992 Dependence on renal dialysis: Secondary | ICD-10-CM | POA: Diagnosis not present

## 2020-01-07 DIAGNOSIS — D509 Iron deficiency anemia, unspecified: Secondary | ICD-10-CM | POA: Diagnosis not present

## 2020-01-07 DIAGNOSIS — R739 Hyperglycemia, unspecified: Secondary | ICD-10-CM | POA: Diagnosis not present

## 2020-01-07 DIAGNOSIS — E1029 Type 1 diabetes mellitus with other diabetic kidney complication: Secondary | ICD-10-CM | POA: Diagnosis not present

## 2020-01-07 DIAGNOSIS — D689 Coagulation defect, unspecified: Secondary | ICD-10-CM | POA: Diagnosis not present

## 2020-01-07 DIAGNOSIS — D631 Anemia in chronic kidney disease: Secondary | ICD-10-CM | POA: Diagnosis not present

## 2020-01-07 DIAGNOSIS — N186 End stage renal disease: Secondary | ICD-10-CM | POA: Diagnosis not present

## 2020-01-07 DIAGNOSIS — N2581 Secondary hyperparathyroidism of renal origin: Secondary | ICD-10-CM | POA: Diagnosis not present

## 2020-01-07 DIAGNOSIS — Z992 Dependence on renal dialysis: Secondary | ICD-10-CM | POA: Diagnosis not present

## 2020-01-10 DIAGNOSIS — N186 End stage renal disease: Secondary | ICD-10-CM | POA: Diagnosis not present

## 2020-01-10 DIAGNOSIS — E1029 Type 1 diabetes mellitus with other diabetic kidney complication: Secondary | ICD-10-CM | POA: Diagnosis not present

## 2020-01-10 DIAGNOSIS — D689 Coagulation defect, unspecified: Secondary | ICD-10-CM | POA: Diagnosis not present

## 2020-01-10 DIAGNOSIS — D631 Anemia in chronic kidney disease: Secondary | ICD-10-CM | POA: Diagnosis not present

## 2020-01-10 DIAGNOSIS — R739 Hyperglycemia, unspecified: Secondary | ICD-10-CM | POA: Diagnosis not present

## 2020-01-10 DIAGNOSIS — D509 Iron deficiency anemia, unspecified: Secondary | ICD-10-CM | POA: Diagnosis not present

## 2020-01-10 DIAGNOSIS — N2581 Secondary hyperparathyroidism of renal origin: Secondary | ICD-10-CM | POA: Diagnosis not present

## 2020-01-10 DIAGNOSIS — Z992 Dependence on renal dialysis: Secondary | ICD-10-CM | POA: Diagnosis not present

## 2020-01-12 DIAGNOSIS — D509 Iron deficiency anemia, unspecified: Secondary | ICD-10-CM | POA: Diagnosis not present

## 2020-01-12 DIAGNOSIS — N2581 Secondary hyperparathyroidism of renal origin: Secondary | ICD-10-CM | POA: Diagnosis not present

## 2020-01-12 DIAGNOSIS — E1029 Type 1 diabetes mellitus with other diabetic kidney complication: Secondary | ICD-10-CM | POA: Diagnosis not present

## 2020-01-12 DIAGNOSIS — D631 Anemia in chronic kidney disease: Secondary | ICD-10-CM | POA: Diagnosis not present

## 2020-01-12 DIAGNOSIS — D689 Coagulation defect, unspecified: Secondary | ICD-10-CM | POA: Diagnosis not present

## 2020-01-12 DIAGNOSIS — Z992 Dependence on renal dialysis: Secondary | ICD-10-CM | POA: Diagnosis not present

## 2020-01-12 DIAGNOSIS — R739 Hyperglycemia, unspecified: Secondary | ICD-10-CM | POA: Diagnosis not present

## 2020-01-12 DIAGNOSIS — N186 End stage renal disease: Secondary | ICD-10-CM | POA: Diagnosis not present

## 2020-01-14 DIAGNOSIS — R739 Hyperglycemia, unspecified: Secondary | ICD-10-CM | POA: Diagnosis not present

## 2020-01-14 DIAGNOSIS — Z992 Dependence on renal dialysis: Secondary | ICD-10-CM | POA: Diagnosis not present

## 2020-01-14 DIAGNOSIS — E1029 Type 1 diabetes mellitus with other diabetic kidney complication: Secondary | ICD-10-CM | POA: Diagnosis not present

## 2020-01-14 DIAGNOSIS — D631 Anemia in chronic kidney disease: Secondary | ICD-10-CM | POA: Diagnosis not present

## 2020-01-14 DIAGNOSIS — D509 Iron deficiency anemia, unspecified: Secondary | ICD-10-CM | POA: Diagnosis not present

## 2020-01-14 DIAGNOSIS — D689 Coagulation defect, unspecified: Secondary | ICD-10-CM | POA: Diagnosis not present

## 2020-01-14 DIAGNOSIS — N186 End stage renal disease: Secondary | ICD-10-CM | POA: Diagnosis not present

## 2020-01-14 DIAGNOSIS — N2581 Secondary hyperparathyroidism of renal origin: Secondary | ICD-10-CM | POA: Diagnosis not present

## 2020-01-17 DIAGNOSIS — E1029 Type 1 diabetes mellitus with other diabetic kidney complication: Secondary | ICD-10-CM | POA: Diagnosis not present

## 2020-01-17 DIAGNOSIS — D689 Coagulation defect, unspecified: Secondary | ICD-10-CM | POA: Diagnosis not present

## 2020-01-17 DIAGNOSIS — N2581 Secondary hyperparathyroidism of renal origin: Secondary | ICD-10-CM | POA: Diagnosis not present

## 2020-01-17 DIAGNOSIS — D509 Iron deficiency anemia, unspecified: Secondary | ICD-10-CM | POA: Diagnosis not present

## 2020-01-17 DIAGNOSIS — R739 Hyperglycemia, unspecified: Secondary | ICD-10-CM | POA: Diagnosis not present

## 2020-01-17 DIAGNOSIS — N186 End stage renal disease: Secondary | ICD-10-CM | POA: Diagnosis not present

## 2020-01-17 DIAGNOSIS — Z992 Dependence on renal dialysis: Secondary | ICD-10-CM | POA: Diagnosis not present

## 2020-01-17 DIAGNOSIS — D631 Anemia in chronic kidney disease: Secondary | ICD-10-CM | POA: Diagnosis not present

## 2020-01-18 DIAGNOSIS — E1029 Type 1 diabetes mellitus with other diabetic kidney complication: Secondary | ICD-10-CM | POA: Diagnosis not present

## 2020-01-18 DIAGNOSIS — Z992 Dependence on renal dialysis: Secondary | ICD-10-CM | POA: Diagnosis not present

## 2020-01-18 DIAGNOSIS — N186 End stage renal disease: Secondary | ICD-10-CM | POA: Diagnosis not present

## 2020-01-19 DIAGNOSIS — N2581 Secondary hyperparathyroidism of renal origin: Secondary | ICD-10-CM | POA: Diagnosis not present

## 2020-01-19 DIAGNOSIS — D631 Anemia in chronic kidney disease: Secondary | ICD-10-CM | POA: Diagnosis not present

## 2020-01-19 DIAGNOSIS — R7881 Bacteremia: Secondary | ICD-10-CM | POA: Diagnosis not present

## 2020-01-19 DIAGNOSIS — Z992 Dependence on renal dialysis: Secondary | ICD-10-CM | POA: Diagnosis not present

## 2020-01-19 DIAGNOSIS — E1029 Type 1 diabetes mellitus with other diabetic kidney complication: Secondary | ICD-10-CM | POA: Diagnosis not present

## 2020-01-19 DIAGNOSIS — N186 End stage renal disease: Secondary | ICD-10-CM | POA: Diagnosis not present

## 2020-01-19 DIAGNOSIS — D509 Iron deficiency anemia, unspecified: Secondary | ICD-10-CM | POA: Diagnosis not present

## 2020-01-19 DIAGNOSIS — D689 Coagulation defect, unspecified: Secondary | ICD-10-CM | POA: Diagnosis not present

## 2020-01-21 DIAGNOSIS — N2581 Secondary hyperparathyroidism of renal origin: Secondary | ICD-10-CM | POA: Diagnosis not present

## 2020-01-21 DIAGNOSIS — D689 Coagulation defect, unspecified: Secondary | ICD-10-CM | POA: Diagnosis not present

## 2020-01-21 DIAGNOSIS — R7881 Bacteremia: Secondary | ICD-10-CM | POA: Diagnosis not present

## 2020-01-21 DIAGNOSIS — D631 Anemia in chronic kidney disease: Secondary | ICD-10-CM | POA: Diagnosis not present

## 2020-01-21 DIAGNOSIS — N186 End stage renal disease: Secondary | ICD-10-CM | POA: Diagnosis not present

## 2020-01-21 DIAGNOSIS — D509 Iron deficiency anemia, unspecified: Secondary | ICD-10-CM | POA: Diagnosis not present

## 2020-01-21 DIAGNOSIS — Z992 Dependence on renal dialysis: Secondary | ICD-10-CM | POA: Diagnosis not present

## 2020-01-21 DIAGNOSIS — E1029 Type 1 diabetes mellitus with other diabetic kidney complication: Secondary | ICD-10-CM | POA: Diagnosis not present

## 2020-01-24 DIAGNOSIS — Z992 Dependence on renal dialysis: Secondary | ICD-10-CM | POA: Diagnosis not present

## 2020-01-24 DIAGNOSIS — D631 Anemia in chronic kidney disease: Secondary | ICD-10-CM | POA: Diagnosis not present

## 2020-01-24 DIAGNOSIS — D689 Coagulation defect, unspecified: Secondary | ICD-10-CM | POA: Diagnosis not present

## 2020-01-24 DIAGNOSIS — R7881 Bacteremia: Secondary | ICD-10-CM | POA: Diagnosis not present

## 2020-01-24 DIAGNOSIS — N2581 Secondary hyperparathyroidism of renal origin: Secondary | ICD-10-CM | POA: Diagnosis not present

## 2020-01-24 DIAGNOSIS — D509 Iron deficiency anemia, unspecified: Secondary | ICD-10-CM | POA: Diagnosis not present

## 2020-01-24 DIAGNOSIS — N186 End stage renal disease: Secondary | ICD-10-CM | POA: Diagnosis not present

## 2020-01-24 DIAGNOSIS — E1029 Type 1 diabetes mellitus with other diabetic kidney complication: Secondary | ICD-10-CM | POA: Diagnosis not present

## 2020-01-26 DIAGNOSIS — D631 Anemia in chronic kidney disease: Secondary | ICD-10-CM | POA: Diagnosis not present

## 2020-01-26 DIAGNOSIS — N2581 Secondary hyperparathyroidism of renal origin: Secondary | ICD-10-CM | POA: Diagnosis not present

## 2020-01-26 DIAGNOSIS — E1029 Type 1 diabetes mellitus with other diabetic kidney complication: Secondary | ICD-10-CM | POA: Diagnosis not present

## 2020-01-26 DIAGNOSIS — D689 Coagulation defect, unspecified: Secondary | ICD-10-CM | POA: Diagnosis not present

## 2020-01-26 DIAGNOSIS — D509 Iron deficiency anemia, unspecified: Secondary | ICD-10-CM | POA: Diagnosis not present

## 2020-01-26 DIAGNOSIS — N186 End stage renal disease: Secondary | ICD-10-CM | POA: Diagnosis not present

## 2020-01-26 DIAGNOSIS — Z992 Dependence on renal dialysis: Secondary | ICD-10-CM | POA: Diagnosis not present

## 2020-01-26 DIAGNOSIS — R7881 Bacteremia: Secondary | ICD-10-CM | POA: Diagnosis not present

## 2020-01-28 DIAGNOSIS — N186 End stage renal disease: Secondary | ICD-10-CM | POA: Diagnosis not present

## 2020-01-28 DIAGNOSIS — E1029 Type 1 diabetes mellitus with other diabetic kidney complication: Secondary | ICD-10-CM | POA: Diagnosis not present

## 2020-01-28 DIAGNOSIS — D689 Coagulation defect, unspecified: Secondary | ICD-10-CM | POA: Diagnosis not present

## 2020-01-28 DIAGNOSIS — D631 Anemia in chronic kidney disease: Secondary | ICD-10-CM | POA: Diagnosis not present

## 2020-01-28 DIAGNOSIS — D509 Iron deficiency anemia, unspecified: Secondary | ICD-10-CM | POA: Diagnosis not present

## 2020-01-28 DIAGNOSIS — Z992 Dependence on renal dialysis: Secondary | ICD-10-CM | POA: Diagnosis not present

## 2020-01-28 DIAGNOSIS — R7881 Bacteremia: Secondary | ICD-10-CM | POA: Diagnosis not present

## 2020-01-28 DIAGNOSIS — N2581 Secondary hyperparathyroidism of renal origin: Secondary | ICD-10-CM | POA: Diagnosis not present

## 2020-01-30 DIAGNOSIS — E1022 Type 1 diabetes mellitus with diabetic chronic kidney disease: Secondary | ICD-10-CM | POA: Diagnosis not present

## 2020-01-31 DIAGNOSIS — R7881 Bacteremia: Secondary | ICD-10-CM | POA: Diagnosis not present

## 2020-01-31 DIAGNOSIS — D509 Iron deficiency anemia, unspecified: Secondary | ICD-10-CM | POA: Diagnosis not present

## 2020-01-31 DIAGNOSIS — D631 Anemia in chronic kidney disease: Secondary | ICD-10-CM | POA: Diagnosis not present

## 2020-01-31 DIAGNOSIS — N186 End stage renal disease: Secondary | ICD-10-CM | POA: Diagnosis not present

## 2020-01-31 DIAGNOSIS — E1029 Type 1 diabetes mellitus with other diabetic kidney complication: Secondary | ICD-10-CM | POA: Diagnosis not present

## 2020-01-31 DIAGNOSIS — D689 Coagulation defect, unspecified: Secondary | ICD-10-CM | POA: Diagnosis not present

## 2020-01-31 DIAGNOSIS — N2581 Secondary hyperparathyroidism of renal origin: Secondary | ICD-10-CM | POA: Diagnosis not present

## 2020-01-31 DIAGNOSIS — Z992 Dependence on renal dialysis: Secondary | ICD-10-CM | POA: Diagnosis not present

## 2020-02-02 DIAGNOSIS — E1029 Type 1 diabetes mellitus with other diabetic kidney complication: Secondary | ICD-10-CM | POA: Diagnosis not present

## 2020-02-02 DIAGNOSIS — D631 Anemia in chronic kidney disease: Secondary | ICD-10-CM | POA: Diagnosis not present

## 2020-02-02 DIAGNOSIS — Z992 Dependence on renal dialysis: Secondary | ICD-10-CM | POA: Diagnosis not present

## 2020-02-02 DIAGNOSIS — D689 Coagulation defect, unspecified: Secondary | ICD-10-CM | POA: Diagnosis not present

## 2020-02-02 DIAGNOSIS — R7881 Bacteremia: Secondary | ICD-10-CM | POA: Diagnosis not present

## 2020-02-02 DIAGNOSIS — D509 Iron deficiency anemia, unspecified: Secondary | ICD-10-CM | POA: Diagnosis not present

## 2020-02-02 DIAGNOSIS — N186 End stage renal disease: Secondary | ICD-10-CM | POA: Diagnosis not present

## 2020-02-02 DIAGNOSIS — N2581 Secondary hyperparathyroidism of renal origin: Secondary | ICD-10-CM | POA: Diagnosis not present

## 2020-02-04 DIAGNOSIS — E1029 Type 1 diabetes mellitus with other diabetic kidney complication: Secondary | ICD-10-CM | POA: Diagnosis not present

## 2020-02-04 DIAGNOSIS — D689 Coagulation defect, unspecified: Secondary | ICD-10-CM | POA: Diagnosis not present

## 2020-02-04 DIAGNOSIS — Z992 Dependence on renal dialysis: Secondary | ICD-10-CM | POA: Diagnosis not present

## 2020-02-04 DIAGNOSIS — D509 Iron deficiency anemia, unspecified: Secondary | ICD-10-CM | POA: Diagnosis not present

## 2020-02-04 DIAGNOSIS — N186 End stage renal disease: Secondary | ICD-10-CM | POA: Diagnosis not present

## 2020-02-04 DIAGNOSIS — R7881 Bacteremia: Secondary | ICD-10-CM | POA: Diagnosis not present

## 2020-02-04 DIAGNOSIS — N2581 Secondary hyperparathyroidism of renal origin: Secondary | ICD-10-CM | POA: Diagnosis not present

## 2020-02-04 DIAGNOSIS — D631 Anemia in chronic kidney disease: Secondary | ICD-10-CM | POA: Diagnosis not present

## 2020-02-07 DIAGNOSIS — N186 End stage renal disease: Secondary | ICD-10-CM | POA: Diagnosis not present

## 2020-02-07 DIAGNOSIS — N2581 Secondary hyperparathyroidism of renal origin: Secondary | ICD-10-CM | POA: Diagnosis not present

## 2020-02-07 DIAGNOSIS — D631 Anemia in chronic kidney disease: Secondary | ICD-10-CM | POA: Diagnosis not present

## 2020-02-07 DIAGNOSIS — R7881 Bacteremia: Secondary | ICD-10-CM | POA: Diagnosis not present

## 2020-02-07 DIAGNOSIS — E1029 Type 1 diabetes mellitus with other diabetic kidney complication: Secondary | ICD-10-CM | POA: Diagnosis not present

## 2020-02-07 DIAGNOSIS — D689 Coagulation defect, unspecified: Secondary | ICD-10-CM | POA: Diagnosis not present

## 2020-02-07 DIAGNOSIS — D509 Iron deficiency anemia, unspecified: Secondary | ICD-10-CM | POA: Diagnosis not present

## 2020-02-07 DIAGNOSIS — Z992 Dependence on renal dialysis: Secondary | ICD-10-CM | POA: Diagnosis not present

## 2020-02-08 ENCOUNTER — Encounter (HOSPITAL_COMMUNITY): Payer: Self-pay

## 2020-02-08 ENCOUNTER — Inpatient Hospital Stay (HOSPITAL_COMMUNITY)
Admission: EM | Admit: 2020-02-08 | Discharge: 2020-02-15 | DRG: 193 | Disposition: A | Discharge status: Home / Self Care | Payer: Medicare HMO | Attending: Internal Medicine | Admitting: Internal Medicine

## 2020-02-08 ENCOUNTER — Other Ambulatory Visit: Payer: Self-pay

## 2020-02-08 ENCOUNTER — Emergency Department (HOSPITAL_COMMUNITY): Payer: Medicare HMO

## 2020-02-08 DIAGNOSIS — J154 Pneumonia due to other streptococci: Principal | ICD-10-CM | POA: Diagnosis present

## 2020-02-08 DIAGNOSIS — K219 Gastro-esophageal reflux disease without esophagitis: Secondary | ICD-10-CM | POA: Diagnosis present

## 2020-02-08 DIAGNOSIS — Z8249 Family history of ischemic heart disease and other diseases of the circulatory system: Secondary | ICD-10-CM

## 2020-02-08 DIAGNOSIS — B955 Unspecified streptococcus as the cause of diseases classified elsewhere: Secondary | ICD-10-CM

## 2020-02-08 DIAGNOSIS — R609 Edema, unspecified: Secondary | ICD-10-CM | POA: Diagnosis not present

## 2020-02-08 DIAGNOSIS — R4020 Unspecified coma: Secondary | ICD-10-CM | POA: Diagnosis not present

## 2020-02-08 DIAGNOSIS — M1990 Unspecified osteoarthritis, unspecified site: Secondary | ICD-10-CM | POA: Diagnosis present

## 2020-02-08 DIAGNOSIS — Z79899 Other long term (current) drug therapy: Secondary | ICD-10-CM

## 2020-02-08 DIAGNOSIS — Z9641 Presence of insulin pump (external) (internal): Secondary | ICD-10-CM | POA: Diagnosis present

## 2020-02-08 DIAGNOSIS — I639 Cerebral infarction, unspecified: Secondary | ICD-10-CM | POA: Diagnosis present

## 2020-02-08 DIAGNOSIS — E162 Hypoglycemia, unspecified: Secondary | ICD-10-CM

## 2020-02-08 DIAGNOSIS — E1022 Type 1 diabetes mellitus with diabetic chronic kidney disease: Secondary | ICD-10-CM | POA: Diagnosis present

## 2020-02-08 DIAGNOSIS — E10649 Type 1 diabetes mellitus with hypoglycemia without coma: Secondary | ICD-10-CM | POA: Diagnosis not present

## 2020-02-08 DIAGNOSIS — R9431 Abnormal electrocardiogram [ECG] [EKG]: Secondary | ICD-10-CM | POA: Diagnosis present

## 2020-02-08 DIAGNOSIS — T68XXXA Hypothermia, initial encounter: Secondary | ICD-10-CM

## 2020-02-08 DIAGNOSIS — I10 Essential (primary) hypertension: Secondary | ICD-10-CM | POA: Diagnosis not present

## 2020-02-08 DIAGNOSIS — R21 Rash and other nonspecific skin eruption: Secondary | ICD-10-CM | POA: Diagnosis present

## 2020-02-08 DIAGNOSIS — Z992 Dependence on renal dialysis: Secondary | ICD-10-CM

## 2020-02-08 DIAGNOSIS — J189 Pneumonia, unspecified organism: Secondary | ICD-10-CM | POA: Diagnosis present

## 2020-02-08 DIAGNOSIS — R404 Transient alteration of awareness: Secondary | ICD-10-CM | POA: Diagnosis not present

## 2020-02-08 DIAGNOSIS — R7881 Bacteremia: Secondary | ICD-10-CM

## 2020-02-08 DIAGNOSIS — I120 Hypertensive chronic kidney disease with stage 5 chronic kidney disease or end stage renal disease: Secondary | ICD-10-CM | POA: Diagnosis present

## 2020-02-08 DIAGNOSIS — Z91048 Other nonmedicinal substance allergy status: Secondary | ICD-10-CM

## 2020-02-08 DIAGNOSIS — E785 Hyperlipidemia, unspecified: Secondary | ICD-10-CM | POA: Diagnosis present

## 2020-02-08 DIAGNOSIS — D631 Anemia in chronic kidney disease: Secondary | ICD-10-CM | POA: Diagnosis present

## 2020-02-08 DIAGNOSIS — E10319 Type 1 diabetes mellitus with unspecified diabetic retinopathy without macular edema: Secondary | ICD-10-CM | POA: Diagnosis present

## 2020-02-08 DIAGNOSIS — Z20822 Contact with and (suspected) exposure to covid-19: Secondary | ICD-10-CM | POA: Diagnosis present

## 2020-02-08 DIAGNOSIS — Z794 Long term (current) use of insulin: Secondary | ICD-10-CM

## 2020-02-08 DIAGNOSIS — Z8673 Personal history of transient ischemic attack (TIA), and cerebral infarction without residual deficits: Secondary | ICD-10-CM

## 2020-02-08 DIAGNOSIS — G9341 Metabolic encephalopathy: Secondary | ICD-10-CM | POA: Diagnosis present

## 2020-02-08 DIAGNOSIS — E109 Type 1 diabetes mellitus without complications: Secondary | ICD-10-CM | POA: Diagnosis present

## 2020-02-08 DIAGNOSIS — E161 Other hypoglycemia: Secondary | ICD-10-CM | POA: Diagnosis not present

## 2020-02-08 DIAGNOSIS — L84 Corns and callosities: Secondary | ICD-10-CM | POA: Diagnosis present

## 2020-02-08 DIAGNOSIS — E1051 Type 1 diabetes mellitus with diabetic peripheral angiopathy without gangrene: Secondary | ICD-10-CM | POA: Diagnosis present

## 2020-02-08 DIAGNOSIS — R531 Weakness: Secondary | ICD-10-CM | POA: Diagnosis not present

## 2020-02-08 DIAGNOSIS — R0602 Shortness of breath: Secondary | ICD-10-CM

## 2020-02-08 DIAGNOSIS — Z7982 Long term (current) use of aspirin: Secondary | ICD-10-CM

## 2020-02-08 DIAGNOSIS — N186 End stage renal disease: Secondary | ICD-10-CM

## 2020-02-08 LAB — BASIC METABOLIC PANEL
Anion gap: 13 (ref 5–15)
BUN: 16 mg/dL (ref 6–20)
CO2: 27 mmol/L (ref 22–32)
Calcium: 9 mg/dL (ref 8.9–10.3)
Chloride: 96 mmol/L — ABNORMAL LOW (ref 98–111)
Creatinine, Ser: 4.61 mg/dL — ABNORMAL HIGH (ref 0.44–1.00)
GFR calc Af Amer: 13 mL/min — ABNORMAL LOW (ref >60)
GFR calc non Af Amer: 11 mL/min — ABNORMAL LOW (ref >60)
Glucose, Bld: 190 mg/dL — ABNORMAL HIGH (ref 70–99)
Potassium: 3.9 mmol/L (ref 3.5–5.1)
Sodium: 136 mmol/L (ref 135–145)

## 2020-02-08 LAB — CBC WITH DIFFERENTIAL/PLATELET
Abs Immature Granulocytes: 0.04 10*3/uL (ref 0.00–0.07)
Basophils Absolute: 0 10*3/uL (ref 0.0–0.1)
Basophils Relative: 1 %
Eosinophils Absolute: 0.1 10*3/uL (ref 0.0–0.5)
Eosinophils Relative: 1 %
HCT: 34 % — ABNORMAL LOW (ref 36.0–46.0)
Hemoglobin: 10.1 g/dL — ABNORMAL LOW (ref 12.0–15.0)
Immature Granulocytes: 1 %
Lymphocytes Relative: 7 %
Lymphs Abs: 0.6 10*3/uL — ABNORMAL LOW (ref 0.7–4.0)
MCH: 25.1 pg — ABNORMAL LOW (ref 26.0–34.0)
MCHC: 29.7 g/dL — ABNORMAL LOW (ref 30.0–36.0)
MCV: 84.4 fL (ref 80.0–100.0)
Monocytes Absolute: 0.3 10*3/uL (ref 0.1–1.0)
Monocytes Relative: 4 %
Neutro Abs: 7.6 10*3/uL (ref 1.7–7.7)
Neutrophils Relative %: 86 %
Platelets: 213 10*3/uL (ref 150–400)
RBC: 4.03 MIL/uL (ref 3.87–5.11)
RDW: 17.6 % — ABNORMAL HIGH (ref 11.5–15.5)
WBC: 8.6 10*3/uL (ref 4.0–10.5)
nRBC: 0 % (ref 0.0–0.2)

## 2020-02-08 LAB — CBG MONITORING, ED
Glucose-Capillary: 111 mg/dL — ABNORMAL HIGH (ref 70–99)
Glucose-Capillary: 150 mg/dL — ABNORMAL HIGH (ref 70–99)
Glucose-Capillary: 181 mg/dL — ABNORMAL HIGH (ref 70–99)
Glucose-Capillary: 88 mg/dL (ref 70–99)
Glucose-Capillary: 89 mg/dL (ref 70–99)

## 2020-02-08 LAB — SARS CORONAVIRUS 2 BY RT PCR (HOSPITAL ORDER, PERFORMED IN ~~LOC~~ HOSPITAL LAB): SARS Coronavirus 2: NEGATIVE

## 2020-02-08 LAB — TROPONIN I (HIGH SENSITIVITY)
Troponin I (High Sensitivity): 18 ng/L — ABNORMAL HIGH (ref <18)
Troponin I (High Sensitivity): 13 ng/L (ref <18)

## 2020-02-08 LAB — LACTIC ACID, PLASMA: Lactic Acid, Venous: 0.7 mmol/L (ref 0.5–1.9)

## 2020-02-08 MED ORDER — ADULT MULTIVITAMIN W/MINERALS CH
1.0000 | ORAL_TABLET | Freq: Every morning | ORAL | Status: DC
  Administered 2020-02-10: 1 via ORAL
  Filled 2020-02-08 (×4): qty 1

## 2020-02-08 MED ORDER — INSULIN ASPART 100 UNIT/ML ~~LOC~~ SOLN
0.0000 [IU] | Freq: Three times a day (TID) | SUBCUTANEOUS | Status: DC
  Administered 2020-02-09: 1 [IU] via SUBCUTANEOUS
  Administered 2020-02-10: 3 [IU] via SUBCUTANEOUS
  Administered 2020-02-11 (×2): 1 [IU] via SUBCUTANEOUS

## 2020-02-08 MED ORDER — SODIUM CHLORIDE 0.9 % IV SOLN
1.0000 g | INTRAVENOUS | Status: DC
  Filled 2020-02-08: qty 10

## 2020-02-08 MED ORDER — SODIUM CHLORIDE 0.9 % IV SOLN
500.0000 mg | Freq: Once | INTRAVENOUS | Status: AC
  Administered 2020-02-08: 500 mg via INTRAVENOUS
  Filled 2020-02-08: qty 500

## 2020-02-08 MED ORDER — LORATADINE 10 MG PO TABS: 10.0000 mg | ORAL_TABLET | Freq: Every day | ORAL | Status: DC | PRN

## 2020-02-08 MED ORDER — CALCIUM ACETATE (PHOS BINDER) 667 MG PO CAPS
667.0000 mg | ORAL_CAPSULE | ORAL | Status: DC
  Administered 2020-02-11 – 2020-02-13 (×2): 667 mg via ORAL
  Filled 2020-02-08: qty 1

## 2020-02-08 MED ORDER — ACETAMINOPHEN 325 MG PO TABS: 650.0000 mg | ORAL_TABLET | Freq: Four times a day (QID) | ORAL | Status: DC | PRN

## 2020-02-08 MED ORDER — LOSARTAN POTASSIUM 25 MG PO TABS
25.0000 mg | ORAL_TABLET | Freq: Two times a day (BID) | ORAL | Status: DC
  Administered 2020-02-09 – 2020-02-15 (×9): 25 mg via ORAL
  Filled 2020-02-08 (×12): qty 1

## 2020-02-08 MED ORDER — HEPARIN SODIUM (PORCINE) 5000 UNIT/ML IJ SOLN
5000.0000 [IU] | Freq: Three times a day (TID) | INTRAMUSCULAR | Status: DC
  Administered 2020-02-09 – 2020-02-14 (×14): 5000 [IU] via SUBCUTANEOUS
  Filled 2020-02-08 (×18): qty 1

## 2020-02-08 MED ORDER — SODIUM CHLORIDE 0.9 % IV SOLN
1.0000 g | Freq: Once | INTRAVENOUS | Status: AC
  Administered 2020-02-08: 1 g via INTRAVENOUS
  Filled 2020-02-08: qty 10

## 2020-02-08 MED ORDER — AMLODIPINE BESYLATE 10 MG PO TABS
10.0000 mg | ORAL_TABLET | Freq: Every day | ORAL | Status: DC
  Administered 2020-02-09 – 2020-02-14 (×7): 10 mg via ORAL
  Filled 2020-02-08: qty 2
  Filled 2020-02-08 (×6): qty 1

## 2020-02-08 MED ORDER — CALCIUM ACETATE 667 MG PO CAPS: 667.0000 mg | ORAL_CAPSULE | Freq: Three times a day (TID) | ORAL | Status: DC

## 2020-02-08 MED ORDER — ROSUVASTATIN CALCIUM 5 MG PO TABS
10.0000 mg | ORAL_TABLET | Freq: Every day | ORAL | Status: DC
  Administered 2020-02-09 – 2020-02-14 (×7): 10 mg via ORAL
  Filled 2020-02-08 (×7): qty 2

## 2020-02-08 MED ORDER — ASPIRIN EC 81 MG PO TBEC
81.0000 mg | DELAYED_RELEASE_TABLET | Freq: Every day | ORAL | Status: DC
  Filled 2020-02-08 (×6): qty 1

## 2020-02-08 MED ORDER — SODIUM CHLORIDE 0.9 % IV SOLN
100.0000 mg | Freq: Two times a day (BID) | INTRAVENOUS | Status: DC
  Filled 2020-02-08: qty 100

## 2020-02-08 MED ORDER — CALCIUM ACETATE (PHOS BINDER) 667 MG PO CAPS
667.0000 mg | ORAL_CAPSULE | Freq: Three times a day (TID) | ORAL | Status: DC
  Administered 2020-02-09 – 2020-02-15 (×9): 667 mg via ORAL
  Filled 2020-02-08 (×12): qty 1

## 2020-02-08 MED ORDER — LABETALOL HCL 200 MG PO TABS
300.0000 mg | ORAL_TABLET | Freq: Every evening | ORAL | Status: DC
  Administered 2020-02-09 – 2020-02-14 (×7): 300 mg via ORAL
  Filled 2020-02-08: qty 1
  Filled 2020-02-08: qty 2
  Filled 2020-02-08 (×5): qty 1

## 2020-02-08 NOTE — ED Notes (Signed)
Another Phlebotomist to attempt to draw second set of blood specimens

## 2020-02-08 NOTE — ED Notes (Signed)
Pt given apple juice and graham crackers 

## 2020-02-08 NOTE — H&P (Signed)
History and Physical    [name] VEH:[id number] DOB: 04/11/1976 DOA: 02/08/2020  PCP: Benito Mccreedy, MD   Patient coming from: Home via EMS  Chief Complaint  Patient presents with  . Hypoglycemia      HPI: [name] is a 44 y.o. female with medical history significant for   ESRD on dialysis TTS-last dialysis 02/07/20 w/ AVF, type 1 diabetes mellitus on insulin pump-with ESRD/diabetic retinopathy, hypertension, hypertension, hyperlipidemia, PVD, history of a stroke, GERD brought to the ED due to unresponsiveness episode.  As per EMS patient was found to unresponsive in bed last seen normal last night, daughter thought that she may be sleeping late afternoon but unable to arouse.  For EMS CBG was 29 EMS gave 1 g of glucagon IM blood sugar went up to 46 then 12.5 g of D10 and blood sugar 118.  Insulin pump was removed by family prior to EMS.  No recent new medication changes, no nausea vomiting fever chills headache numbness focal weakness shortness of breath abdominal pain.  Last dialysis yesterday.  But patient reports she has had nonproductive cough for last week and daughter also had similar cough.  Patient is not vaccinated against Covid.  ED Course: Prior to arrival In the ED blood sugar 88>111.Vitals  with blood pressure 140s to 150s, saturating 92 to 98% room air.  Hypothermic at 99.9, mildly confused and since blood sugar improved temperature has been 98.4, mental status normal.  Routine blood work shows normal potassium bicarb creatinine 4.6, CBC fairly stable with chronic anemia hemoglobin 10.1. Trop 18>13.  Chest x-ray showed: right midlung opacity consistent with bronchopneumonia, blood cultures were ordered, ceftriaxone and azithromycin was ordered and admission was requested for further management and monitoring. Currently patient feels well no nausea vomiting.  She is alert awake and at baseline. She has not had Covid vaccine and is not planning to have one  anytime.  Review of Systems: All systems were reviewed and were negative except as mentioned in HPI above. Negative for vomiting Negative for focal weakness Negative for shortness of breath  Past Medical History:  Diagnosis Date  . Anemia   . Arthritis   . Diabetes mellitus   . Diabetic retinopathy ([place])    Hx of laser Rx's  . DM type 1 (diabetes mellitus, type 1) ([place]) 09/12/2014   Age of onset for DM type 1 was age 38.     . Enlarged thyroid gland   . ESRD on hemodialysis ([place])    ESRD due to DM type I, age of onset DM 1 was age 45.  Went on dialysis in March 2012.  Gets HD now at [facility] on a TTS schedule.    Marland Kitchen ESRD on hemodialysis ([place]) 09/12/2014   ESRD due to DM type 1.  Started HD in 2012 at [facility].  Gets HD there on a TTS schedule.    Marland Kitchen GERD (gastroesophageal reflux disease)   . History of blood transfusion   . Hyperlipidemia   . Hypertension   . Peripheral vascular disease ([place])   . Pneumonia   . Stroke [facility])    facial drooping    Past Surgical History:  Procedure Laterality Date  . ARTERIOVENOUS GRAFT PLACEMENT    . ARTERY REPAIR Left 12/10/2012   Procedure: BRACHIAL ARTERY REPAIR;  Surgeon: Angelia Mould, MD;  Location: [facility] OR;  Service: Vascular;  Laterality: Left;  Exploration Left Brachial Artery for AVF  . AV FISTULA PLACEMENT Right 01/11/2018  Procedure: INSERTION OF ARTERIOVENOUS (AV) GORE-TEX GRAFT  UPPER ARM;  Surgeon: Angelia Mould, MD;  Location: [place];  Service: Vascular;  Laterality: Right;  . BASCILIC VEIN TRANSPOSITION Right 09/28/2017   Procedure: FIRST STAGE BASILIC VEIN TRANSPOSITION;  Surgeon: Angelia Mould, MD;  Location: [place];  Service: Vascular;  Laterality: Right;  . CESAREAN SECTION    . EYE SURGERY     [place]  . EYE SURGERY Left   . REVISION OF ARTERIOVENOUS GORETEX GRAFT Left 08/01/2016   Procedure: REVISION OF ARTERIOVENOUS GORETEX GRAFT;  Surgeon: Angelia Mould, MD;  Location: [place];  Service:  Vascular;  Laterality: Left;  . REVISION OF ARTERIOVENOUS GORETEX GRAFT Right 10/21/2019   Procedure: REVISION OF ARTERIOVENOUS GORETEX GRAFT ARM;  Surgeon: Rosetta Posner, MD;  Location: [place];  Service: Vascular;  Laterality: Right;  . SHUNTOGRAM Left 11/30/2013   Procedure: SHUNTOGRAM;  Surgeon: Serafina Mitchell, MD;  Location: [facility] C[facility] LAB;  Service: Cardiovascular;  Laterality: Left;     reports that she has never smoked. She has never used smokeless tobacco. She reports that she does not drink alcohol and does not use drugs.  Allergies  Allergen Reactions  . Pollen Extract Other (See Comments)    Watery eyes    Family History  Problem Relation Age of Onset  . Cancer Mother        breast  . Hypertension Father      Prior to Admission medications   Medication Sig Start Date End Date Taking? Authorizing Provider  amLODipine (NORVASC) 10 MG tablet Take 10 mg by mouth at bedtime.    Yes [provider]  aspirin 81 MG EC tablet Take 81 mg by mouth at bedtime. Swallow whole.    Yes [provider]  calcium acetate (PHOSLO) 667 MG capsule Take 667 mg by mouth 3 (three) times daily with meals. May take an additional 667 mg dose with snacks   Yes [provider]  GLUCAGON EMERGENCY 1 MG injection Inject 1 mg into the muscle once as needed (for severe low blood sugar).  11/22/12  Yes [provider]  insulin aspart (NOVOLOG) 100 UNIT/ML injection Inject 50 Units into the skin See admin instructions. Use with insulin pump daily 02/17/18  Yes [provider]  Insulin Human (INSULIN PUMP) SOLN Inject into the skin continuous. Novolog   Basal rate 0.29 units/ hour   Yes [provider]  labetalol (NORMODYNE) 300 MG tablet Take 300 mg by mouth every evening.    Yes [provider]  loratadine (CLARITIN) 10 MG tablet Take 10 mg by mouth daily as needed for allergies.   Yes [provider]  losartan (COZAAR) 25 MG tablet Take 25 mg  by mouth in the morning and at bedtime.   Yes [provider]  Pediatric Multiple Vit-C-FA (FLINSTONES GUMMIES OMEGA-3 DHA) CHEW Chew 2 tablets by mouth in the morning.    Yes [provider]  rosuvastatin (CRESTOR) 10 MG tablet Take 10 mg by mouth at bedtime.    Yes [provider]    Physical Exam: Vitals:   02/08/20 2124 02/08/20 2147 02/08/20 2157 02/08/20 2245  BP: (!) 149/65 (!) 153/67  133/69  Pulse: 85  84 88  Resp: (!) 22 (!) 22 (!) 24 (!) 21  Temp:      TempSrc:      SpO2: 99%  96% 93%  Weight:      Height:  General exam: AAOx3, not in acute distress, old for her age  HEENT:Oral mucosa moist, Ear/Nose WNL grossly, dentition normal. Respiratory system: bilaterally clear,no wheezing or crackles,no use of accessory muscle Cardiovascular system: S1 & S2 +, No JVD,. Gastrointestinal system: Abdomen soft, NT,ND, BS+ Nervous System:Alert, awake, moving extremities and grossly nonfocal Extremities: No edema, distal peripheral pulses palpable.  Skin: No rashes,no icterus. MSK: Normal muscle bulk,tone, power  Right upper extremity graft present for HD  Foley Catheter:  Labs on Admission: I have personally reviewed following labs and imaging studies  CBC: Recent Labs  Lab 02/08/20 1715  WBC 8.6  NEUTROABS 7.6  HGB 10.1*  HCT 34.0*  MCV 84.4  PLT 528   Basic Metabolic Panel: Recent Labs  Lab 02/08/20 1715  NA 136  K 3.9  CL 96*  CO2 27  GLUCOSE 190*  BUN 16  CREATININE 4.61*  CALCIUM 9.0   GFR: Estimated Creatinine Clearance: 13.3 mL/min (A) (by C-G formula based on SCr of 4.61 mg/dL (H)). Liver Function Tests: No results for input(s): AST, ALT, ALKPHOS, BILITOT, PROT, ALBUMIN in the last 168 hours. No results for input(s): LIPASE, AMYLASE in the last 168 hours. No results for input(s): AMMONIA in the last 168 hours. Coagulation Profile: No results for input(s): INR, PROTIME in the last 168 hours. Cardiac Enzymes: No  results for input(s): CKTOTAL, CKMB, CKMBINDEX, TROPONINI in the last 168 hours. BNP (last 3 results) No results for input(s): PROBNP in the last 8760 hours. HbA1C: No results for input(s): HGBA1C in the last 72 hours. CBG: Recent Labs  Lab 02/08/20 1602 02/08/20 1707 02/08/20 1813 02/08/20 1938  GLUCAP 89 181* 88 111*   Lipid Profile: No results for input(s): CHOL, HDL, LDLCALC, TRIG, CHOLHDL, LDLDIRECT in the last 72 hours. Thyroid Function Tests: No results for input(s): TSH, T4TOTAL, FREET4, T3FREE, THYROIDAB in the last 72 hours. Anemia Panel: No results for input(s): VITAMINB12, FOLATE, FERRITIN, TIBC, IRON, RETICCTPCT in the last 72 hours. Urine analysis:    Component Value Date/Time   COLORURINE YELLOW 03/29/2014 [place] 03/29/2014 1734   LABSPEC 1.015 03/29/2014 1734   PHURINE 8.0 03/29/2014 1734   GLUCOSEU >1000 (A) 03/29/2014 1734   HGBUR SMALL (A) 03/29/2014 1734   BILIRUBINUR NEGATIVE 03/29/2014 1734   KETONESUR NEGATIVE 03/29/2014 1734   PROTEINUR 100 (A) 03/29/2014 1734   UROBILINOGEN 0.2 03/29/2014 1734   NITRITE NEGATIVE 03/29/2014 1734   LEUKOCYTESUR TRACE (A) 03/29/2014 1734    Radiological Exams on Admission: DG Chest Portable 1 View  Result Date: 02/08/2020 CLINICAL DATA:  Weakness, unresponsive EXAM: PORTABLE CHEST 1 VIEW COMPARISON:  04/15/2018 FINDINGS: Single frontal view of the chest demonstrates stable enlargement of the cardiac silhouette. There is focal consolidation within the right midlung zone overlying the right anterior fourth rib. No effusion or pneumothorax. Postsurgical changes left axilla. No acute bony abnormalities. IMPRESSION: 1. Focal airspace disease right midlung zone, consistent with bronchopneumonia. Electronically Signed   By: Randa Ngo M.D.   On: 02/08/2020 19:02     Assessment/Plan  Hypoglycemia: Likely in the setting of pneumonia, also with insulin pump.  After glucagon and dextrose blood sugar has  improved.  Will monitor serial Accu-Cheks frequently as ordered overnight.Hold off on insulin pump add low dose sliding scale insulin in am, allow renal diet.  Bronchopneumonia seen in the chest x-ray, patient presented with hypoglycemia and hypothermia: We will continue ceftriaxone and doxycycline.  Avoid azithromycin due to prolonged QTC on EKG, monitor vitals temperature,  follow-up CBC in the morning.  Check a sputum culture, follow-up blood culture.  ESRD on dialysis TTS-last dialysis 02/07/20 w/ AV graft on RUE. Last dialysis yesterday full treatment.  Consult nephrology in am for HD.  Currently on bicarb potassium stable.  Type 1 diabetes mellitus on insulin pump-with ESRD/diabetic retinopathy: Off insulin pump reviewed by family prior to EMS arrival.  We will keep on sliding scale.  Prolonged QTC repeat EKG in the morning, avoid QTC prolonging medication.  Essential hypertension: Blood pressure is stable.  Continue medication including amlodipine, labetalol, losartan twice daily, resume home meds-but needs to monitor blood pressure closely. Hyperlipidemia: On Crestor PVD: Continue her aspirin, Crestor. history of a stroke: On aspirin and Crestor.  Body mass index is 22.96 kg/m.   Severity of Illness: The appropriate patient status for this patient is OBSERVATION. Observation status is judged to be reasonable and necessary in order to provide the required intensity of service to ensure the patient's safety. The patient's presenting symptoms, physical exam findings, and initial radiographic and laboratory data in the context of their medical condition is felt to place them at decreased risk for further clinical deterioration. Furthermore, it is anticipated that the patient will be medically stable for discharge from the hospital within 2 midnights of admission. The following factors support the patient status of observation due to hypoglycemia, bronchopneumonia with ESRD.    DVT  prophylaxis: heparin injection 5,000 Units Start: 02/08/20 2315 Code Status:   Code Status: Full Code  Family Communication: Admission, patients condition and plan of care including tests being ordered have been discussed with the patient, who indicate understanding and agree with the plan and Code Status.  Consults called:  None  Antonieta Pert MD [facility]  If 7PM-7AM, please contact night-coverage [email or web address]  02/08/2020, 11:11 PM

## 2020-02-08 NOTE — ED Notes (Signed)
Phlebotomy to attempt to draw second set of blood cultures.

## 2020-02-08 NOTE — ED Provider Notes (Signed)
[place] EMERGENCY DEPARTMENT Provider Note   CSN: [id number] Arrival date & time: 02/08/20  1600     History Chief Complaint  Patient presents with  . Hypoglycemia    [name] is a 44 y.o. female with history of ESRD on dialysis Tuesday Thursday Saturday, hypertension, hyperlipidemia, peripheral vascular disease, CVA, hypertension, type 1 diabetes mellitus, anemia presenting for evaluation after unresponsive episode.  Per EMS the patient was found to be unresponsive in bed.  She was last seen normal sometime last night.  Her daughter thought that she was sleeping in late this afternoon and then noted that she was unable to arouse her so she called out to EMS.  On arrival with EMS CBG was 29.  She received 1 g of glucagon IM with improvement in her CBG to 46, then received 12.5 g of D10 IV with improvement of to 118.  Family removed the insulin pump prior to EMS arrival.  The patient tells me that she went to sleep sometime around 11 PM.  She did have something to eat last night but she cannot remember what it was or when she ate it.  She denies any new medication changes or dosage adjustments.  Currently she tells me that she feels very lightheaded and generally very weak.  She denies headache, numbness or weakness of the extremities, chest pain, shortness of breath, abdominal pain, nausea, vomiting, vision changes.  She tells me she feels very cold and has several blankets atop her. She last underwent dialysis yesterday and reports that she had a full treatment.   She also tells me that she has had a nonproductive cough for the last week.  Her daughter has a similar cough.  She is not vaccinated against Covid.  She is a non-smoker.  The history is provided by the patient.       Past Medical History:  Diagnosis Date  . Anemia   . Arthritis   . Diabetes mellitus   . Diabetic retinopathy ([place])    Hx of laser Rx's  . DM type 1 (diabetes mellitus, type 1)  ([place]) 09/12/2014   Age of onset for DM type 1 was age 58.     . Enlarged thyroid gland   . ESRD on hemodialysis ([place])    ESRD due to DM type I, age of onset DM 1 was age 14.  Went on dialysis in March 2012.  Gets HD now at [facility] on a TTS schedule.    Marland Kitchen ESRD on hemodialysis ([place]) 09/12/2014   ESRD due to DM type 1.  Started HD in 2012 at [facility].  Gets HD there on a TTS schedule.    Marland Kitchen GERD (gastroesophageal reflux disease)   . History of blood transfusion   . Hyperlipidemia   . Hypertension   . Peripheral vascular disease ([place])   . Pneumonia   . Stroke [facility])    facial drooping    Patient Active Problem List   Diagnosis Date Noted  . Hypoglycemia 02/08/2020  . Hemorrhage from arteriovenous dialysis graft ([place]) 10/20/2019  . Hyperkalemia 04/15/2018  . Nausea 04/15/2018  . Cerebral infarction due to thrombosis of basilar artery ([place]) 03/11/2017  . Left hand pain 08/25/2016  . Hyperparathyroidism, secondary renal ([place])   . Benign essential HTN   . Pneumonia 09/12/2014  . CAP (community acquired pneumonia) 09/12/2014  . ESRD on hemodialysis ([place]) 09/12/2014  . Diabetes mellitus type I ([place]) 09/12/2014  . History of diabetic retinopathy  09/12/2014  . HCAP (healthcare-associated pneumonia) 09/11/2014  . Cerebral infarction due to unspecified mechanism 05/22/2014  . Hypertensive emergency 05/22/2014  . Essential hypertension, benign 05/22/2014  . DM (diabetes mellitus) ([place]) 05/22/2014  . HLD (hyperlipidemia) 05/22/2014  . Cerebral infarction ([place]) 03/29/2014  . Facial droop 03/29/2014  . Mechanical complication of other vascular device, implant, and graft 01/12/2013  . Other complications due to renal dialysis device, implant, and graft 12/08/2012  . ESRD on hemodialysis ([place]) 04/18/2011    Past Surgical History:  Procedure Laterality Date  . ARTERIOVENOUS GRAFT PLACEMENT    . ARTERY REPAIR Left 12/10/2012   Procedure: BRACHIAL ARTERY REPAIR;  Surgeon: Angelia Mould, MD;  Location: [facility] OR;  Service: Vascular;  Laterality: Left;  Exploration Left Brachial Artery for AVF  . AV FISTULA PLACEMENT Right 01/11/2018   Procedure: INSERTION OF ARTERIOVENOUS (AV) GORE-TEX GRAFT  UPPER ARM;  Surgeon: Angelia Mould, MD;  Location: [place];  Service: Vascular;  Laterality: Right;  . BASCILIC VEIN TRANSPOSITION Right 09/28/2017   Procedure: FIRST STAGE BASILIC VEIN TRANSPOSITION;  Surgeon: Angelia Mould, MD;  Location: [place];  Service: Vascular;  Laterality: Right;  . CESAREAN SECTION    . EYE SURGERY     [place]  . EYE SURGERY Left   . REVISION OF ARTERIOVENOUS GORETEX GRAFT Left 08/01/2016   Procedure: REVISION OF ARTERIOVENOUS GORETEX GRAFT;  Surgeon: Angelia Mould, MD;  Location: [place];  Service: Vascular;  Laterality: Left;  . REVISION OF ARTERIOVENOUS GORETEX GRAFT Right 10/21/2019   Procedure: REVISION OF ARTERIOVENOUS GORETEX GRAFT ARM;  Surgeon: Rosetta Posner, MD;  Location: [place];  Service: Vascular;  Laterality: Right;  . SHUNTOGRAM Left 11/30/2013   Procedure: SHUNTOGRAM;  Surgeon: Serafina Mitchell, MD;  Location: [facility] CATH LAB;  Service: Cardiovascular;  Laterality: Left;     OB History    Gravida  0   Para  0   Term  0   Preterm  0   AB  0   Living        SAB  0   TAB  0   Ectopic  0   Multiple      Live Births              Family History  Problem Relation Age of Onset  . Cancer Mother        breast  . Hypertension Father     Social History   Tobacco Use  . Smoking status: Never Smoker  . Smokeless tobacco: Never Used  Vaping Use  . Vaping Use: Never used  Substance Use Topics  . Alcohol use: No    Alcohol/week: 0.0 standard drinks  . Drug use: No    Home Medications Prior to Admission medications   Medication Sig Start Date End Date Taking? Authorizing Provider  amLODipine (NORVASC) 10 MG tablet Take 10 mg by mouth at bedtime.    Yes [provider]  aspirin 81 MG EC tablet Take  81 mg by mouth at bedtime. Swallow whole.    Yes [provider]  calcium acetate (PHOSLO) 667 MG capsule Take 667 mg by mouth 3 (three) times daily with meals. May take an additional 667 mg dose with snacks   Yes [provider]  GLUCAGON EMERGENCY 1 MG injection Inject 1 mg into the muscle once as needed (for severe low blood sugar).  11/22/12  Yes [provider]  insulin aspart (NOVOLOG) 100 UNIT/ML injection Inject  50 Units into the skin See admin instructions. Use with insulin pump daily 02/17/18  Yes [provider]  Insulin Human (INSULIN PUMP) SOLN Inject into the skin continuous. Novolog   Basal rate 0.29 units/ hour   Yes [provider]  labetalol (NORMODYNE) 300 MG tablet Take 300 mg by mouth every evening.    Yes [provider]  loratadine (CLARITIN) 10 MG tablet Take 10 mg by mouth daily as needed for allergies.   Yes [provider]  losartan (COZAAR) 25 MG tablet Take 25 mg by mouth in the morning and at bedtime.   Yes [provider]  Pediatric Multiple Vit-C-FA (FLINSTONES GUMMIES OMEGA-3 DHA) CHEW Chew 2 tablets by mouth in the morning.    Yes [provider]  rosuvastatin (CRESTOR) 10 MG tablet Take 10 mg by mouth at bedtime.    Yes [provider]    Allergies    Pollen extract  Review of Systems   Review of Systems  Constitutional: Positive for chills and fatigue. Negative for fever.  Respiratory: Negative for shortness of breath.   Cardiovascular: Negative for chest pain.  Gastrointestinal: Negative for abdominal pain, nausea and vomiting.  Neurological: Positive for light-headedness.    Physical Exam Updated Vital Signs BP 133/69   Pulse 88   Temp 98.4 F (36.9 C) (Oral)   Resp (!) 21   Ht 5\' 3"  (1.6 m)   Wt 58.8 kg   SpO2 93%   BMI 22.96 kg/m   Physical Exam Vitals and nursing note reviewed.  Constitutional:      General: She is not in acute distress.     Appearance: She is well-developed.  HENT:     Head: Normocephalic and atraumatic.  Eyes:     General:        Right eye: No discharge.        Left eye: No discharge.     Conjunctiva/sclera: Conjunctivae normal.  Neck:     Vascular: No JVD.     Trachea: No tracheal deviation.  Cardiovascular:     Rate and Rhythm: Normal rate and regular rhythm.     Comments: Old AV fistula to the left upper extremity.  She has never a fistula to the right upper extremity with palpable thrill.  2+ radial and DP/PT pulses bilaterally Pulmonary:     Effort: Pulmonary effort is normal.     Breath sounds: Normal breath sounds.  Abdominal:     General: There is no distension.     Palpations: Abdomen is soft.     Tenderness: There is no abdominal tenderness. There is no guarding or rebound.  Musculoskeletal:     Cervical back: Neck supple.  Skin:    General: Skin is warm and dry.     Findings: No erythema.  Neurological:     Mental Status: She is alert and oriented to person, place, and time.     Comments: Mental Status:  Alert, thought content appropriate, able to give a coherent history. Speech fluent without evidence of aphasia. Able to follow 2 step commands without difficulty.  Cranial Nerves:  II:  Peripheral visual fields grossly normal, pupils equal, round, reactive to light III,IV, VI: ptosis not present, extra-ocular motions intact bilaterally  V,VII: smile symmetric, facial light touch sensation equal VIII: hearing grossly normal to voice  X: uvula elevates symmetrically  XI: bilateral shoulder shrug symmetric and strong XII: midline tongue extension without fassiculations Motor:  Normal tone. 4+/5 strength of BUE and  BLE major muscle groups including strong and equal grip strength and dorsiflexion/plantar flexion Sensory: light touch normal in all extremities. Endorses feeling "lightheaded" when sat up. No nystagmus noted.  Gait: Deferred at this time  Psychiatric:        Behavior:  Behavior normal.     ED Results / Procedures / Treatments   Labs (all labs ordered are listed, but only abnormal results are displayed) Labs Reviewed  CBC WITH DIFFERENTIAL/PLATELET - Abnormal; Notable for the following components:      Result Value   Hemoglobin 10.1 (*)    HCT 34.0 (*)    MCH 25.1 (*)    MCHC 29.7 (*)    RDW 17.6 (*)    Lymphs Abs 0.6 (*)    All other components within normal limits  BASIC METABOLIC PANEL - Abnormal; Notable for the following components:   Chloride 96 (*)    Glucose, Bld 190 (*)    Creatinine, Ser 4.61 (*)    GFR calc non Af Amer 11 (*)    GFR calc Af Amer 13 (*)    All other components within normal limits  CBG MONITORING, ED - Abnormal; Notable for the following components:   Glucose-Capillary 181 (*)    All other components within normal limits  CBG MONITORING, ED - Abnormal; Notable for the following components:   Glucose-Capillary 111 (*)    All other components within normal limits  TROPONIN I (HIGH SENSITIVITY) - Abnormal; Notable for the following components:   Troponin I (High Sensitivity) 18 (*)    All other components within normal limits  SARS CORONAVIRUS 2 BY RT PCR (HOSPITAL ORDER, [place] [facility])  CULTURE, BLOOD (ROUTINE X 2)  CULTURE, BLOOD (ROUTINE X 2)  EXPECTORATED SPUTUM ASSESSMENT W REFEX TO RESP CULTURE  LACTIC ACID, PLASMA  LACTIC ACID, PLASMA  HIV ANTIBODY (ROUTINE TESTING W REFLEX)  CBC  CREATININE, SERUM  HEMOGLOBIN A1C  CBC  BASIC METABOLIC PANEL  PROCALCITONIN  CBG MONITORING, ED  CBG MONITORING, ED  TROPONIN I (HIGH SENSITIVITY)    EKG EKG Interpretation  Date/Time:  Wednesday February 08 2020 16:06:31 EDT Ventricular Rate:  58 PR Interval:    QRS Duration: 100 QT Interval:  560 QTC Calculation: 551 R Axis:   79 Text Interpretation: Junctional rhythm Nonspecific T abnormalities, inferior leads Prolonged QT interval Confirmed by Virgel Manifold [phone]) on 02/08/2020 11:00:29  PM   Radiology DG Chest Portable 1 View  Result Date: 02/08/2020 CLINICAL DATA:  Weakness, unresponsive EXAM: PORTABLE CHEST 1 VIEW COMPARISON:  04/15/2018 FINDINGS: Single frontal view of the chest demonstrates stable enlargement of the cardiac silhouette. There is focal consolidation within the right midlung zone overlying the right anterior fourth rib. No effusion or pneumothorax. Postsurgical changes left axilla. No acute bony abnormalities. IMPRESSION: 1. Focal airspace disease right midlung zone, consistent with bronchopneumonia. Electronically Signed   By: Randa Ngo M.D.   On: 02/08/2020 19:02    Procedures .Critical Care Performed by: Renita Papa, PA-C Authorized by: Renita Papa, PA-C   Critical care provider statement:    Critical care time (minutes):  50   Critical care was necessary to treat or prevent imminent or life-threatening deterioration of the following conditions:  Metabolic crisis   Critical care was time spent personally by me on the following activities:  Discussions with consultants, evaluation of patient's response to treatment, examination of patient, ordering and performing treatments and interventions, ordering and review of laboratory studies, ordering  and review of radiographic studies, pulse oximetry, re-evaluation of patient's condition, obtaining history from patient or surrogate and review of old charts   (including critical care time)  Medications Ordered in ED Medications  rosuvastatin (CRESTOR) tablet 10 mg (has no administration in time range)  multivitamin with minerals tablet 1 tablet (has no administration in time range)  losartan (COZAAR) tablet 25 mg (has no administration in time range)  loratadine (CLARITIN) tablet 10 mg (has no administration in time range)  labetalol (NORMODYNE) tablet 300 mg (has no administration in time range)  aspirin EC tablet 81 mg (has no administration in time range)  amLODipine (NORVASC) tablet 10 mg (has  no administration in time range)  calcium acetate (PHOSLO) capsule 667 mg (has no administration in time range)    And  calcium acetate (PHOSLO) capsule 667 mg (has no administration in time range)  heparin injection 5,000 Units (has no administration in time range)  cefTRIAXone (ROCEPHIN) 1 g in sodium chloride 0.9 % 100 mL IVPB (has no administration in time range)  doxycycline (VIBRAMYCIN) 100 mg in sodium chloride 0.9 % 250 mL IVPB (has no administration in time range)  acetaminophen (TYLENOL) tablet 650 mg (has no administration in time range)  insulin aspart (novoLOG) injection 0-6 Units (has no administration in time range)  cefTRIAXone (ROCEPHIN) 1 g in sodium chloride 0.9 % 100 mL IVPB (0 g Intravenous Stopped 02/08/20 2205)  azithromycin (ZITHROMAX) 500 mg in sodium chloride 0.9 % 250 mL IVPB (500 mg Intravenous New Bag/Given 02/08/20 2205)    ED Course  I have reviewed the triage vital signs and the nursing notes.  Pertinent labs & imaging results that were available during my care of the patient were reviewed by me and considered in my medical decision making (see chart for details).    MDM Rules/Calculators/A&P                          NEVAYA NAGELE was evaluated in Emergency Department on 02/08/2020 for the symptoms described in the history of present illness. She was evaluated in the context of the global COVID-19 pandemic, which necessitated consideration that the patient might be at risk for infection with the SARS-CoV-2 virus that causes COVID-19. Institutional protocols and algorithms that pertain to the evaluation of patients at risk for COVID-19 are in a state of rapid change based on information released by regulatory bodies including the CDC and federal and state organizations. These policies and algorithms were followed during the patient's care in the ED.  Patient brought in for evaluation of unresponsive episode. Hypoglycemic on EMS arrival, improved with  administration of glucose with EMS. On initial assessment she is mildly confused but oriented to person, place, and time. She is complaining of feeling very cold and lightheaded.  Found to be hypothermic with a rectal temp of 91.9 F so she was placed on [facility].  Work-up significant for right middle lobe bronchopneumonia and she is complaining of a cough that has been present for around 1 week.  Lab work reviewed and interpreted by myself shows no leukocytosis, stable anemia, findings consistent with her history of ESRD on dialysis.  No indications for emergent dialysis at this time.  Blood sugars have been somewhat labile in the ED but above threshold for normal limits.  On reevaluation she is resting more comfortably.  Her core temperature has improved.  She is mentating normally.  She was started on IV Rocephin and  azithromycin for pneumonia.  Her curb 65 score is 1 and her PSI/port score is 76.  Given her episode of unresponsiveness, hypothermia and hyper glycemia suspect she would benefit from admission to the hospital for further evaluation and management and she is agreeable to this.  Spoke with Dr. Maren Beach with [facility] hospitalist service who agrees to assume care of patient and bring her to the hospital for further evaluation.  Final Clinical Impression(s) / ED Diagnoses Final diagnoses:  Community acquired pneumonia of right middle lobe of lung  Hypothermia, initial encounter  Hypoglycemia    Rx / DC Orders ED Discharge Orders    None       Debroah Baller 02/08/20 2328    Virgel Manifold, MD 02/09/20 2000

## 2020-02-08 NOTE — ED Triage Notes (Addendum)
Pt BIB [facility] from home  Pt is T1DM (insulin pump removed by family PTA), Pt has Dialysis (T, R, Sat.)  Pt became unresponsive while laying in bed. LSN was sometime last night but daughter thought she was sleeping in late this afternoon. CBG with EMS on scene 29.  Given 1 g of Glucagon IM, rose to 46 Given 12.5 g of D10 IV, rose to 118  EMS VS: 111/75 BP HR 55 18 RR 99% RA O2  Pt now A&Ox4, GCS 15

## 2020-02-09 ENCOUNTER — Encounter (HOSPITAL_COMMUNITY): Payer: Self-pay | Admitting: Internal Medicine

## 2020-02-09 DIAGNOSIS — E1122 Type 2 diabetes mellitus with diabetic chronic kidney disease: Secondary | ICD-10-CM | POA: Diagnosis not present

## 2020-02-09 DIAGNOSIS — E10649 Type 1 diabetes mellitus with hypoglycemia without coma: Secondary | ICD-10-CM | POA: Diagnosis not present

## 2020-02-09 DIAGNOSIS — E162 Hypoglycemia, unspecified: Secondary | ICD-10-CM

## 2020-02-09 DIAGNOSIS — R7881 Bacteremia: Secondary | ICD-10-CM | POA: Diagnosis not present

## 2020-02-09 DIAGNOSIS — L84 Corns and callosities: Secondary | ICD-10-CM | POA: Diagnosis present

## 2020-02-09 DIAGNOSIS — J154 Pneumonia due to other streptococci: Secondary | ICD-10-CM | POA: Diagnosis not present

## 2020-02-09 DIAGNOSIS — I517 Cardiomegaly: Secondary | ICD-10-CM | POA: Diagnosis not present

## 2020-02-09 DIAGNOSIS — E1022 Type 1 diabetes mellitus with diabetic chronic kidney disease: Secondary | ICD-10-CM | POA: Diagnosis not present

## 2020-02-09 DIAGNOSIS — Z20822 Contact with and (suspected) exposure to covid-19: Secondary | ICD-10-CM | POA: Diagnosis not present

## 2020-02-09 DIAGNOSIS — J90 Pleural effusion, not elsewhere classified: Secondary | ICD-10-CM | POA: Diagnosis not present

## 2020-02-09 DIAGNOSIS — E10319 Type 1 diabetes mellitus with unspecified diabetic retinopathy without macular edema: Secondary | ICD-10-CM | POA: Diagnosis present

## 2020-02-09 DIAGNOSIS — Z91048 Other nonmedicinal substance allergy status: Secondary | ICD-10-CM | POA: Diagnosis not present

## 2020-02-09 DIAGNOSIS — Z7982 Long term (current) use of aspirin: Secondary | ICD-10-CM | POA: Diagnosis not present

## 2020-02-09 DIAGNOSIS — Z79899 Other long term (current) drug therapy: Secondary | ICD-10-CM | POA: Diagnosis not present

## 2020-02-09 DIAGNOSIS — I361 Nonrheumatic tricuspid (valve) insufficiency: Secondary | ICD-10-CM | POA: Diagnosis not present

## 2020-02-09 DIAGNOSIS — G9341 Metabolic encephalopathy: Secondary | ICD-10-CM | POA: Diagnosis not present

## 2020-02-09 DIAGNOSIS — Z8673 Personal history of transient ischemic attack (TIA), and cerebral infarction without residual deficits: Secondary | ICD-10-CM | POA: Diagnosis not present

## 2020-02-09 DIAGNOSIS — R0602 Shortness of breath: Secondary | ICD-10-CM | POA: Diagnosis not present

## 2020-02-09 DIAGNOSIS — B955 Unspecified streptococcus as the cause of diseases classified elsewhere: Secondary | ICD-10-CM | POA: Diagnosis not present

## 2020-02-09 DIAGNOSIS — Z992 Dependence on renal dialysis: Secondary | ICD-10-CM | POA: Diagnosis not present

## 2020-02-09 DIAGNOSIS — R21 Rash and other nonspecific skin eruption: Secondary | ICD-10-CM | POA: Diagnosis present

## 2020-02-09 DIAGNOSIS — D631 Anemia in chronic kidney disease: Secondary | ICD-10-CM | POA: Diagnosis not present

## 2020-02-09 DIAGNOSIS — I371 Nonrheumatic pulmonary valve insufficiency: Secondary | ICD-10-CM | POA: Diagnosis not present

## 2020-02-09 DIAGNOSIS — N186 End stage renal disease: Secondary | ICD-10-CM | POA: Diagnosis not present

## 2020-02-09 DIAGNOSIS — K219 Gastro-esophageal reflux disease without esophagitis: Secondary | ICD-10-CM | POA: Diagnosis present

## 2020-02-09 DIAGNOSIS — R9431 Abnormal electrocardiogram [ECG] [EKG]: Secondary | ICD-10-CM | POA: Diagnosis present

## 2020-02-09 DIAGNOSIS — E109 Type 1 diabetes mellitus without complications: Secondary | ICD-10-CM | POA: Diagnosis not present

## 2020-02-09 DIAGNOSIS — E785 Hyperlipidemia, unspecified: Secondary | ICD-10-CM | POA: Diagnosis present

## 2020-02-09 DIAGNOSIS — I120 Hypertensive chronic kidney disease with stage 5 chronic kidney disease or end stage renal disease: Secondary | ICD-10-CM | POA: Diagnosis not present

## 2020-02-09 DIAGNOSIS — M1990 Unspecified osteoarthritis, unspecified site: Secondary | ICD-10-CM | POA: Diagnosis present

## 2020-02-09 DIAGNOSIS — E1051 Type 1 diabetes mellitus with diabetic peripheral angiopathy without gangrene: Secondary | ICD-10-CM | POA: Diagnosis present

## 2020-02-09 DIAGNOSIS — Z9641 Presence of insulin pump (external) (internal): Secondary | ICD-10-CM | POA: Diagnosis present

## 2020-02-09 DIAGNOSIS — I070 Rheumatic tricuspid stenosis: Secondary | ICD-10-CM | POA: Diagnosis not present

## 2020-02-09 DIAGNOSIS — J189 Pneumonia, unspecified organism: Secondary | ICD-10-CM | POA: Diagnosis not present

## 2020-02-09 DIAGNOSIS — Z794 Long term (current) use of insulin: Secondary | ICD-10-CM | POA: Diagnosis not present

## 2020-02-09 LAB — RESPIRATORY PANEL BY PCR

## 2020-02-09 LAB — GLUCOSE, CAPILLARY
Glucose-Capillary: 196 mg/dL — ABNORMAL HIGH (ref 70–99)
Glucose-Capillary: 114 mg/dL — ABNORMAL HIGH (ref 70–99)
Glucose-Capillary: 24 mg/dL — CL (ref 70–99)
Glucose-Capillary: 71 mg/dL (ref 70–99)
Glucose-Capillary: 73 mg/dL (ref 70–99)

## 2020-02-09 LAB — HIV ANTIBODY (ROUTINE TESTING W REFLEX): HIV Screen 4th Generation wRfx: NONREACTIVE

## 2020-02-09 LAB — BASIC METABOLIC PANEL
Anion gap: 19 — ABNORMAL HIGH (ref 5–15)
BUN: 23 mg/dL — ABNORMAL HIGH (ref 6–20)
CO2: 22 mmol/L (ref 22–32)
Calcium: 8.8 mg/dL — ABNORMAL LOW (ref 8.9–10.3)
Chloride: 95 mmol/L — ABNORMAL LOW (ref 98–111)
Creatinine, Ser: 5.42 mg/dL — ABNORMAL HIGH (ref 0.44–1.00)
GFR calc Af Amer: 11 mL/min — ABNORMAL LOW (ref >60)
GFR calc non Af Amer: 9 mL/min — ABNORMAL LOW (ref >60)
Glucose, Bld: 179 mg/dL — ABNORMAL HIGH (ref 70–99)
Potassium: 3.6 mmol/L (ref 3.5–5.1)
Sodium: 136 mmol/L (ref 135–145)

## 2020-02-09 LAB — BLOOD CULTURE ID PANEL (REFLEXED)

## 2020-02-09 LAB — CBC
HCT: 31.4 % — ABNORMAL LOW (ref 36.0–46.0)
HCT: 33.7 % — ABNORMAL LOW (ref 36.0–46.0)
Hemoglobin: 10.1 g/dL — ABNORMAL LOW (ref 12.0–15.0)
Hemoglobin: 9.5 g/dL — ABNORMAL LOW (ref 12.0–15.0)
MCH: 25.1 pg — ABNORMAL LOW (ref 26.0–34.0)
MCH: 25.7 pg — ABNORMAL LOW (ref 26.0–34.0)
MCHC: 30 g/dL (ref 30.0–36.0)
MCHC: 30.3 g/dL (ref 30.0–36.0)
MCV: 83.6 fL (ref 80.0–100.0)
MCV: 85.1 fL (ref 80.0–100.0)
Platelets: 249 10*3/uL (ref 150–400)
Platelets: 251 10*3/uL (ref 150–400)
RBC: 3.69 MIL/uL — ABNORMAL LOW (ref 3.87–5.11)
RBC: 4.03 MIL/uL (ref 3.87–5.11)
RDW: 17.9 % — ABNORMAL HIGH (ref 11.5–15.5)
RDW: 18.1 % — ABNORMAL HIGH (ref 11.5–15.5)
WBC: 9.4 10*3/uL (ref 4.0–10.5)
WBC: 9.7 10*3/uL (ref 4.0–10.5)
nRBC: 0 % (ref 0.0–0.2)
nRBC: 0 % (ref 0.0–0.2)

## 2020-02-09 LAB — MRSA PCR SCREENING: MRSA by PCR: NEGATIVE

## 2020-02-09 LAB — CREATININE, SERUM
Creatinine, Ser: 5.08 mg/dL — ABNORMAL HIGH (ref 0.44–1.00)
GFR calc Af Amer: 11 mL/min — ABNORMAL LOW (ref >60)
GFR calc non Af Amer: 10 mL/min — ABNORMAL LOW (ref >60)

## 2020-02-09 LAB — CBG MONITORING, ED
Glucose-Capillary: 157 mg/dL — ABNORMAL HIGH (ref 70–99)
Glucose-Capillary: 164 mg/dL — ABNORMAL HIGH (ref 70–99)
Glucose-Capillary: 171 mg/dL — ABNORMAL HIGH (ref 70–99)
Glucose-Capillary: 187 mg/dL — ABNORMAL HIGH (ref 70–99)

## 2020-02-09 LAB — HEMOGLOBIN A1C
Hgb A1c MFr Bld: 6.2 % — ABNORMAL HIGH (ref 4.8–5.6)
Mean Plasma Glucose: 131.24 mg/dL

## 2020-02-09 LAB — PROCALCITONIN
Procalcitonin: 22.03 ng/mL
Procalcitonin: 39.19 ng/mL

## 2020-02-09 LAB — LACTIC ACID, PLASMA: Lactic Acid, Venous: 1.5 mmol/L (ref 0.5–1.9)

## 2020-02-09 MED ORDER — DOXERCALCIFEROL 4 MCG/2ML IV SOLN
1.0000 ug | INTRAVENOUS | Status: DC
  Filled 2020-02-09 (×3): qty 2

## 2020-02-09 MED ORDER — ONDANSETRON HCL 4 MG/2ML IJ SOLN
4.0000 mg | Freq: Once | INTRAMUSCULAR | Status: AC
  Administered 2020-02-09: 4 mg via INTRAVENOUS
  Filled 2020-02-09: qty 2

## 2020-02-09 MED ORDER — SODIUM CHLORIDE 0.9 % IV SOLN
2.0000 g | INTRAVENOUS | Status: AC
  Administered 2020-02-10 – 2020-02-13 (×4): 2 g via INTRAVENOUS
  Filled 2020-02-09: qty 20
  Filled 2020-02-09: qty 2
  Filled 2020-02-09 (×2): qty 20
  Filled 2020-02-09: qty 2

## 2020-02-09 MED ORDER — INSULIN GLARGINE 100 UNIT/ML ~~LOC~~ SOLN
12.0000 [IU] | Freq: Every day | SUBCUTANEOUS | Status: DC
  Administered 2020-02-09: 12 [IU] via SUBCUTANEOUS
  Filled 2020-02-09: qty 0.12

## 2020-02-09 MED ORDER — CHLORHEXIDINE GLUCONATE CLOTH 2 % EX PADS
6.0000 | MEDICATED_PAD | Freq: Every day | CUTANEOUS | Status: DC
  Administered 2020-02-09 – 2020-02-14 (×6): 6 via TOPICAL

## 2020-02-09 MED ORDER — ONDANSETRON HCL 4 MG/2ML IJ SOLN: 4.0000 mg | Freq: Four times a day (QID) | INTRAMUSCULAR | Status: DC | PRN

## 2020-02-09 MED ORDER — DEXTROSE 50 % IV SOLN
INTRAVENOUS | Status: AC
  Administered 2020-02-09: 50 mL
  Filled 2020-02-09: qty 50

## 2020-02-09 MED ORDER — DOXERCALCIFEROL 4 MCG/2ML IV SOLN
INTRAVENOUS | Status: AC
  Administered 2020-02-09: 1 ug
  Filled 2020-02-09: qty 2

## 2020-02-09 MED ORDER — INSULIN GLARGINE 100 UNIT/ML ~~LOC~~ SOLN
7.0000 [IU] | Freq: Every day | SUBCUTANEOUS | Status: DC
  Filled 2020-02-09: qty 0.07

## 2020-02-09 NOTE — ED Notes (Signed)
Pt ambulated well with no gait/mobility issues.

## 2020-02-09 NOTE — ED Notes (Signed)
Report given to Stacey RN.

## 2020-02-09 NOTE — ED Notes (Signed)
Attempted report 

## 2020-02-09 NOTE — Progress Notes (Signed)
PHARMACY - PHYSICIAN COMMUNICATION CRITICAL VALUE ALERT - BLOOD CULTURE IDENTIFICATION (BCID)  [name] is an 44 y.o. female who presented to [facility] on 02/08/2020 with a chief complaint of hypoglycemia  Assessment:  Patient with ESRD on ceftriaxone and doxycycline for pneumonia treatment found to have two sets of blood cultures positive for streptococcus species  Name of physician (or Provider) Contacted: Dr. Sloan Leiter  Current antibiotics: ceftriaxone and doxycycline  Changes to prescribed antibiotics recommended: Change to ceftriaxone 2g IV q24h monotheray Recommendations accepted by provider  Results for orders placed or performed during the hospital encounter of 02/08/20  Blood Culture ID Panel (Reflexed) (Collected: 02/08/2020  6:15 PM)  Result Value Ref Range   Enterococcus species NOT DETECTED NOT DETECTED   Listeria monocytogenes NOT DETECTED NOT DETECTED   Staphylococcus species NOT DETECTED NOT DETECTED   Staphylococcus aureus (BCID) NOT DETECTED NOT DETECTED   Streptococcus species DETECTED (A) NOT DETECTED   Streptococcus agalactiae NOT DETECTED NOT DETECTED   Streptococcus pneumoniae NOT DETECTED NOT DETECTED   Streptococcus pyogenes NOT DETECTED NOT DETECTED   Acinetobacter baumannii NOT DETECTED NOT DETECTED   Enterobacteriaceae species NOT DETECTED NOT DETECTED   Enterobacter cloacae complex NOT DETECTED NOT DETECTED   Escherichia coli NOT DETECTED NOT DETECTED   Klebsiella oxytoca NOT DETECTED NOT DETECTED   Klebsiella pneumoniae NOT DETECTED NOT DETECTED   Proteus species NOT DETECTED NOT DETECTED   Serratia marcescens NOT DETECTED NOT DETECTED   Haemophilus influenzae NOT DETECTED NOT DETECTED   Neisseria meningitidis NOT DETECTED NOT DETECTED   Pseudomonas aeruginosa NOT DETECTED NOT DETECTED   Candida albicans NOT DETECTED NOT DETECTED   Candida glabrata NOT DETECTED NOT DETECTED   Candida krusei NOT DETECTED NOT DETECTED   Candida parapsilosis  NOT DETECTED NOT DETECTED   Candida tropicalis NOT DETECTED NOT DETECTED    Candie Mile 02/09/2020  11:38 AM

## 2020-02-09 NOTE — Progress Notes (Signed)
Inpatient Diabetes Program Recommendations  AACE/ADA: New Consensus Statement on Inpatient Glycemic Control (2015)  Target Ranges:  Prepandial:   less than 140 mg/dL      Peak postprandial:   less than 180 mg/dL (1-2 hours)      Critically ill patients:  140 - 180 mg/dL   Lab Results  Component Value Date   GLUCAP 196 (H) 02/09/2020   HGBA1C 6.2 (H) 02/08/2020    Review of Glycemic Control  Diabetes history: type 1? Outpatient Diabetes medications: insulin pump Current orders for Inpatient glycemic control: Lantus 12 units daily, Novolog 0-6 units correction scale TID  Inpatient Diabetes Program Recommendations:   Received diabetes coordinator consult in regards to hypoglycemia with insulin pump. Noted that patient sees Autumn Jones with [facility] as her PCP and endocrinology consults.   Was inpatient in April, 2021. Insulin pump settings at that time were:  Basal Rates: 12A      0.28 units/hr 3A        0.35 units/hr 9A        0.29 units/hr 7P        0.3 units/hr Total 24 hour Basal: 7.04 units/24 hours  Insulin to Carb Ratio: 12A      1:22 (1 unit covers 22 grams of carbs) 3A        1:26 (1 unit covers 26 grams of carbs) 9A        1:24 (1 unit covers 24 grams of carbs) 7P        1:23 (1 unit covers 23 grams of carbs)  Insulin Sensitivity: 1:85 (1 unit drops glucose 85 mg/dl)  Target Glucose: 120 mg/dl  Recommend continuing current insulin orders while in the hospital.  Will need to follow up with Peri Jefferson for readjustment of pump, if patient is going to remain on pump. Will continue to monitor blood sugars while in the hospital.  Harvel Ricks RN BSN CDE Diabetes Coordinator Pager: [phone]  8am-5pm

## 2020-02-09 NOTE — Progress Notes (Signed)
CBG 71, pt had apple juice afterwards. Will recheck CBG in 4 hr.

## 2020-02-09 NOTE — Consult Note (Signed)
[facility]  INPATIENT CONSULTATION  Reason for Consultation: ESRD with need for dialysis Requesting Provider: Dr. Sloan Leiter  HPI: [name] is an 44 y.o. female with DM type 1, ESRD on HD TTS AF, GERD, HL, HTN, arthritis, anemia who is admitted with PNA and seen for ESRD dialysis and assoc conditions.   Presented yesterday with AMS, hypoglycemia at home into 20s.  W/u showed RLL PNA for which she's being treated.  Afebrile, WBC 9.7.  Had rhinorrhea, dry cough; daughter was sick too.  Also reporting some N/V but feels like she has volume on.     PMH: Past Medical History:  Diagnosis Date  . Anemia   . Arthritis   . Diabetes mellitus   . Diabetic retinopathy ([place])    Hx of laser Rx's  . DM type 1 (diabetes mellitus, type 1) ([place]) 09/12/2014   Age of onset for DM type 1 was age 62.     . Enlarged thyroid gland   . ESRD on hemodialysis ([place])    ESRD due to DM type I, age of onset DM 1 was age 31.  Went on dialysis in March 2012.  Gets HD now at [facility] on a TTS schedule.    Marland Kitchen ESRD on hemodialysis ([place]) 09/12/2014   ESRD due to DM type 1.  Started HD in 2012 at [facility].  Gets HD there on a TTS schedule.    Marland Kitchen GERD (gastroesophageal reflux disease)   . History of blood transfusion   . Hyperlipidemia   . Hypertension   . Peripheral vascular disease ([place])   . Pneumonia    PSH: Past Surgical History:  Procedure Laterality Date  . ARTERIOVENOUS GRAFT PLACEMENT    . ARTERY REPAIR Left 12/10/2012   Procedure: BRACHIAL ARTERY REPAIR;  Surgeon: Angelia Mould, MD;  Location: [facility] OR;  Service: Vascular;  Laterality: Left;  Exploration Left Brachial Artery for AVF  . AV FISTULA PLACEMENT Right 01/11/2018   Procedure: INSERTION OF ARTERIOVENOUS (AV) GORE-TEX GRAFT  UPPER ARM;  Surgeon: Angelia Mould, MD;  Location: [facility];  Service: Vascular;  Laterality: Right;  . BASCILIC VEIN TRANSPOSITION Right 09/28/2017   Procedure: FIRST STAGE BASILIC VEIN  TRANSPOSITION;  Surgeon: Angelia Mould, MD;  Location: [facility];  Service: Vascular;  Laterality: Right;  . CESAREAN SECTION    . EYE SURGERY     [place]  . EYE SURGERY Left   . REVISION OF ARTERIOVENOUS GORETEX GRAFT Left 08/01/2016   Procedure: REVISION OF ARTERIOVENOUS GORETEX GRAFT;  Surgeon: Angelia Mould, MD;  Location: [facility];  Service: Vascular;  Laterality: Left;  . REVISION OF ARTERIOVENOUS GORETEX GRAFT Right 10/21/2019   Procedure: REVISION OF ARTERIOVENOUS GORETEX GRAFT ARM;  Surgeon: Rosetta Posner, MD;  Location: [facility];  Service: Vascular;  Laterality: Right;  . SHUNTOGRAM Left 11/30/2013   Procedure: SHUNTOGRAM;  Surgeon: Serafina Mitchell, MD;  Location: [facility] CATH LAB;  Service: Cardiovascular;  Laterality: Left;     Past Medical History:  Diagnosis Date  . Anemia   . Arthritis   . Diabetes mellitus   . Diabetic retinopathy ([place])    Hx of laser Rx's  . DM type 1 (diabetes mellitus, type 1) ([place]) 09/12/2014   Age of onset for DM type 1 was age 60.     . Enlarged thyroid gland   . ESRD on hemodialysis ([place])    ESRD due to DM type I, age of onset DM 1 was  age 30.  Went on dialysis in March 2012.  Gets HD now at [facility] on a TTS schedule.    Marland Kitchen ESRD on hemodialysis ([place]) 09/12/2014   ESRD due to DM type 1.  Started HD in 2012 at [facility].  Gets HD there on a TTS schedule.    Marland Kitchen GERD (gastroesophageal reflux disease)   . History of blood transfusion   . Hyperlipidemia   . Hypertension   . Peripheral vascular disease ([place])   . Pneumonia     Medications:  I have reviewed the patient's current medications.  Medications Prior to Admission  Medication Sig Dispense Refill  . amLODipine (NORVASC) 10 MG tablet Take 10 mg by mouth at bedtime.     Marland Kitchen aspirin 81 MG EC tablet Take 81 mg by mouth at bedtime. Swallow whole.     . calcium acetate (PHOSLO) 667 MG capsule Take 667 mg by mouth 3 (three) times daily with meals. May take an additional 667 mg dose with snacks     . GLUCAGON EMERGENCY 1 MG injection Inject 1 mg into the muscle once as needed (for severe low blood sugar).     . insulin aspart (NOVOLOG) 100 UNIT/ML injection Inject 50 Units into the skin See admin instructions. Use with insulin pump daily    . Insulin Human (INSULIN PUMP) SOLN Inject into the skin continuous. Novolog   Basal rate 0.29 units/ hour    . labetalol (NORMODYNE) 300 MG tablet Take 300 mg by mouth every evening.     . loratadine (CLARITIN) 10 MG tablet Take 10 mg by mouth daily as needed for allergies.    Marland Kitchen losartan (COZAAR) 25 MG tablet Take 25 mg by mouth in the morning and at bedtime.    . Pediatric Multiple Vit-C-FA (FLINSTONES GUMMIES OMEGA-3 DHA) CHEW Chew 2 tablets by mouth in the morning.     . rosuvastatin (CRESTOR) 10 MG tablet Take 10 mg by mouth at bedtime.       ALLERGIES:   Allergies  Allergen Reactions  . Pollen Extract Other (See Comments)    Watery eyes    FAM HX: Family History  Problem Relation Age of Onset  . Cancer Mother        breast  . Hypertension Father     Social History:   reports that she has never smoked. She has never used smokeless tobacco. She reports that she does not drink alcohol and does not use drugs.  ROS: 12 system ROS per HPI above  Blood pressure 106/68, pulse 70, temperature 98.2 F (36.8 C), temperature source Oral, resp. rate 19, height 5\' 3"  (1.6 m), weight 56.5 kg, SpO2 92 %. PHYSICAL EXAM: Gen: quiet in bed, no distress  Eyes: anicteric ENT: MMM Neck: no JVD CV:  RRR, 3/6 SEM Abd:  Soft, nontender Lungs: dec BS R base o/w clear, no rales Extr:  Trace tibial edema, RUE AVG +t/b Neuro: nonfocal grossly Skin: warm and dry   Results for orders placed or performed during the hospital encounter of 02/08/20 (from the past 48 hour(s))  CBG monitoring, ED     Status: None   Collection Time: 02/08/20  4:02 PM  Result Value Ref Range   Glucose-Capillary 89 70 - 99 mg/dL    Comment: Glucose reference range applies  only to samples taken after fasting for at least 8 hours.  POC CBG, ED     Status: Abnormal   Collection Time: 02/08/20  5:07 PM  Result  Value Ref Range   Glucose-Capillary 181 (H) 70 - 99 mg/dL    Comment: Glucose reference range applies only to samples taken after fasting for at least 8 hours.  CBC with Differential     Status: Abnormal   Collection Time: 02/08/20  5:15 PM  Result Value Ref Range   WBC 8.6 4.0 - 10.5 K/uL   RBC 4.03 3.87 - 5.11 MIL/uL   Hemoglobin 10.1 (L) 12.0 - 15.0 g/dL   HCT 34.0 (L) 36 - 46 %   MCV 84.4 80.0 - 100.0 fL   MCH 25.1 (L) 26.0 - 34.0 pg   MCHC 29.7 (L) 30.0 - 36.0 g/dL   RDW 17.6 (H) 11.5 - 15.5 %   Platelets 213 150 - 400 K/uL   nRBC 0.0 0.0 - 0.2 %   Neutrophils Relative % 86 %   Neutro Abs 7.6 1.7 - 7.7 K/uL   Lymphocytes Relative 7 %   Lymphs Abs 0.6 (L) 0.7 - 4.0 K/uL   Monocytes Relative 4 %   Monocytes Absolute 0.3 0 - 1 K/uL   Eosinophils Relative 1 %   Eosinophils Absolute 0.1 0 - 0 K/uL   Basophils Relative 1 %   Basophils Absolute 0.0 0 - 0 K/uL   Immature Granulocytes 1 %   Abs Immature Granulocytes 0.04 0.00 - 0.07 K/uL    Comment: Performed at [facility], [address], [place], [place] [address]  Basic metabolic panel     Status: Abnormal   Collection Time: 02/08/20  5:15 PM  Result Value Ref Range   Sodium 136 135 - 145 mmol/L   Potassium 3.9 3.5 - 5.1 mmol/L   Chloride 96 (L) 98 - 111 mmol/L   CO2 27 22 - 32 mmol/L   Glucose, Bld 190 (H) 70 - 99 mg/dL    Comment: Glucose reference range applies only to samples taken after fasting for at least 8 hours.   BUN 16 6 - 20 mg/dL   Creatinine, Ser 4.61 (H) 0.44 - 1.00 mg/dL   Calcium 9.0 8.9 - 10.3 mg/dL   GFR calc non Af Amer 11 (L) >60 mL/min   GFR calc Af Amer 13 (L) >60 mL/min   Anion gap 13 5 - 15    Comment: Performed at [facility] [address] [place], [place] [address]  Troponin I (High Sensitivity)     Status: Abnormal   Collection Time:  02/08/20  5:25 PM  Result Value Ref Range   Troponin I (High Sensitivity) 18 (H) <18 ng/L    Comment: (NOTE) Elevated high sensitivity troponin I (hsTnI) values and significant  changes across serial measurements may suggest ACS but many other  chronic and acute conditions are known to elevate hsTnI results.  Refer to the "Links" section for chest pain algorithms and additional  guidance. Performed at [facility], [place] [address], [place], [place] [address]   POC CBG, ED     Status: None   Collection Time: 02/08/20  6:13 PM  Result Value Ref Range   Glucose-Capillary 88 70 - 99 mg/dL    Comment: Glucose reference range applies only to samples taken after fasting for at least 8 hours.  Lactic acid, plasma     Status: None   Collection Time: 02/08/20  6:15 PM  Result Value Ref Range   Lactic Acid, Venous 0.7 0.5 - 1.9 mmol/L    Comment: Performed at [facility]  [address], [place], [place] [address]  Culture, blood (routine x 2)     Status: None (Preliminary result)   Collection Time: 02/08/20  6:15 PM   Specimen: BLOOD  Result Value Ref Range   Specimen Description BLOOD LEFT ANTECUBITAL    Special Requests      BOTTLES DRAWN AEROBIC AND ANAEROBIC Blood Culture adequate volume   Culture  Setup Time      GRAM POSITIVE COCCI IN CHAINS ANAEROBIC BOTTLE ONLY Organism ID to follow Performed at [facility], [place] [address], [place], [place] [address]    Culture GRAM POSITIVE COCCI    Report Status PENDING   Troponin I (High Sensitivity)     Status: None   Collection Time: 02/08/20  7:38 PM  Result Value Ref Range   Troponin I (High Sensitivity) 13 <18 ng/L    Comment: (NOTE) Elevated high sensitivity troponin I (hsTnI) values and significant  changes across serial measurements may suggest ACS but many other  chronic and acute conditions are known to elevate hsTnI results.  Refer to the "Links" section for chest pain algorithms and additional   guidance. Performed at [facility], [place] [address] [place], [place] [address]   CBG monitoring, ED     Status: Abnormal   Collection Time: 02/08/20  7:38 PM  Result Value Ref Range   Glucose-Capillary 111 (H) 70 - 99 mg/dL    Comment: Glucose reference range applies only to samples taken after fasting for at least 8 hours.  SARS Coronavirus 2 by RT PCR (hospital order, performed in [facility] hospital lab) Nasopharyngeal Nasopharyngeal Swab     Status: None   Collection Time: 02/08/20  8:48 PM   Specimen: Nasopharyngeal Swab  Result Value Ref Range   SARS Coronavirus 2 NEGATIVE NEGATIVE    Comment: (NOTE) SARS-CoV-2 target nucleic acids are NOT DETECTED.  The SARS-CoV-2 RNA is generally detectable in upper and lower respiratory specimens during the acute phase of infection. The lowest concentration of SARS-CoV-2 viral copies this assay can detect is 250 copies / mL. A negative result does not preclude SARS-CoV-2 infection and should not be used as the sole basis for treatment or other patient management decisions.  A negative result may occur with improper specimen collection / handling, submission of specimen other than nasopharyngeal swab, presence of viral mutation(s) within the areas targeted by this assay, and inadequate number of viral copies (<250 copies / mL). A negative result must be combined with clinical observations, patient history, and epidemiological information.  Fact Sheet for Patients:   StrictlyIdeas.no  Fact Sheet for Healthcare Providers: BankingDealers.co.za  This test is not yet approved or  cleared by the [place] FDA and has been authorized for detection and/or diagnosis of SARS-CoV-2 by FDA under an Emergency Use Authorization (EUA).  This EUA will remain in effect (meaning this test can be used) for the duration of the COVID-19 declaration under Section 564(b)(1) of the Act, 21  U.S.C. section 360bbb-3(b)(1), unless the authorization is terminated or revoked sooner.  Performed at [facility], [place] [address] [place], [place] [address]   CBG monitoring, ED     Status: Abnormal   Collection Time: 02/08/20 11:54 PM  Result Value Ref Range   Glucose-Capillary 150 (H) 70 - 99 mg/dL    Comment: Glucose reference range applies only to samples taken after fasting for at least 8 hours.  CBC     Status: Abnormal   Collection Time: 02/08/20  11:55 PM  Result Value Ref Range   WBC 9.4 4.0 - 10.5 K/uL   RBC 4.03 3.87 - 5.11 MIL/uL   Hemoglobin 10.1 (L) 12.0 - 15.0 g/dL   HCT 33.7 (L) 36 - 46 %   MCV 83.6 80.0 - 100.0 fL   MCH 25.1 (L) 26.0 - 34.0 pg   MCHC 30.0 30.0 - 36.0 g/dL   RDW 17.9 (H) 11.5 - 15.5 %   Platelets 251 150 - 400 K/uL   nRBC 0.0 0.0 - 0.2 %    Comment: Performed at [facility] [address], [place], [place] [address]  Creatinine, serum     Status: Abnormal   Collection Time: 02/08/20 11:55 PM  Result Value Ref Range   Creatinine, Ser 5.08 (H) 0.44 - 1.00 mg/dL   GFR calc non Af Amer 10 (L) >60 mL/min   GFR calc Af Amer 11 (L) >60 mL/min    Comment: Performed at [facility] [address] [place], [place] [address]  Hemoglobin A1c     Status: Abnormal   Collection Time: 02/08/20 11:55 PM  Result Value Ref Range   Hgb A1c MFr Bld 6.2 (H) 4.8 - 5.6 %    Comment: (NOTE) Pre diabetes:          5.7%-6.4%  Diabetes:              >6.4%  Glycemic control for   <7.0% adults with diabetes    Mean Plasma Glucose 131.24 mg/dL    Comment: Performed at [facility] [address] [place], [place] [address]  Procalcitonin - Baseline     Status: None   Collection Time: 02/08/20 11:55 PM  Result Value Ref Range   Procalcitonin 22.03 ng/mL    Comment:        Interpretation: PCT >= 10 ng/mL: Important systemic inflammatory response, almost exclusively due to severe bacterial sepsis or septic shock. (NOTE)        Sepsis PCT Algorithm           Lower Respiratory Tract                                      Infection PCT Algorithm    ----------------------------     ----------------------------         PCT < 0.25 ng/mL                PCT < 0.10 ng/mL          Strongly encourage             Strongly discourage   discontinuation of antibiotics    initiation of antibiotics    ----------------------------     -----------------------------       PCT 0.25 - 0.50 ng/mL            PCT 0.10 - 0.25 ng/mL               OR       >80% decrease in PCT            Discourage initiation of                                            antibiotics      Encourage  discontinuation           of antibiotics    ----------------------------     -----------------------------         PCT >= 0.50 ng/mL              PCT 0.26 - 0.50 ng/mL                AND       <80% decrease in PCT             Encourage initiation of                                             antibiotics       Encourage continuation           of antibiotics    ----------------------------     -----------------------------        PCT >= 0.50 ng/mL                  PCT > 0.50 ng/mL               AND         increase in PCT                  Strongly encourage                                      initiation of antibiotics    Strongly encourage escalation           of antibiotics                                     -----------------------------                                           PCT <= 0.25 ng/mL                                                 OR                                        > 80% decrease in PCT                                      Discontinue / Do not initiate                                             antibiotics  Performed at [facility], [address] [place], [place] [address]   CBG monitoring, ED     Status: Abnormal   Collection Time: 02/09/20  2:47 AM  Result  Value Ref Range   Glucose-Capillary 157 (H) 70 - 99 mg/dL     Comment: Glucose reference range applies only to samples taken after fasting for at least 8 hours.  CBC     Status: Abnormal   Collection Time: 02/09/20  4:05 AM  Result Value Ref Range   WBC 9.7 4.0 - 10.5 K/uL   RBC 3.69 (L) 3.87 - 5.11 MIL/uL   Hemoglobin 9.5 (L) 12.0 - 15.0 g/dL   HCT 31.4 (L) 36 - 46 %   MCV 85.1 80.0 - 100.0 fL   MCH 25.7 (L) 26.0 - 34.0 pg   MCHC 30.3 30.0 - 36.0 g/dL   RDW 18.1 (H) 11.5 - 15.5 %   Platelets 249 150 - 400 K/uL   nRBC 0.0 0.0 - 0.2 %    Comment: Performed at [facility] [address], [place], [place] [address]  Basic metabolic panel     Status: Abnormal   Collection Time: 02/09/20  4:05 AM  Result Value Ref Range   Sodium 136 135 - 145 mmol/L   Potassium 3.6 3.5 - 5.1 mmol/L   Chloride 95 (L) 98 - 111 mmol/L   CO2 22 22 - 32 mmol/L   Glucose, Bld 179 (H) 70 - 99 mg/dL    Comment: Glucose reference range applies only to samples taken after fasting for at least 8 hours.   BUN 23 (H) 6 - 20 mg/dL   Creatinine, Ser 5.42 (H) 0.44 - 1.00 mg/dL   Calcium 8.8 (L) 8.9 - 10.3 mg/dL   GFR calc non Af Amer 9 (L) >60 mL/min   GFR calc Af Amer 11 (L) >60 mL/min   Anion gap 19 (H) 5 - 15    Comment: Performed at [facility] [address] [place], [place] [address]  CBG monitoring, ED     Status: Abnormal   Collection Time: 02/09/20  4:17 AM  Result Value Ref Range   Glucose-Capillary 164 (H) 70 - 99 mg/dL    Comment: Glucose reference range applies only to samples taken after fasting for at least 8 hours.  CBG monitoring, ED     Status: Abnormal   Collection Time: 02/09/20  6:19 AM  Result Value Ref Range   Glucose-Capillary 171 (H) 70 - 99 mg/dL    Comment: Glucose reference range applies only to samples taken after fasting for at least 8 hours.  CBG monitoring, ED     Status: Abnormal   Collection Time: 02/09/20  8:23 AM  Result Value Ref Range   Glucose-Capillary 187 (H) 70 - 99 mg/dL    Comment: Glucose reference range  applies only to samples taken after fasting for at least 8 hours.  Glucose, capillary     Status: Abnormal   Collection Time: 02/09/20  9:29 AM  Result Value Ref Range   Glucose-Capillary 196 (H) 70 - 99 mg/dL    Comment: Glucose reference range applies only to samples taken after fasting for at least 8 hours.    DG Chest Portable 1 View  Result Date: 02/08/2020 CLINICAL DATA:  Weakness, unresponsive EXAM: PORTABLE CHEST 1 VIEW COMPARISON:  04/15/2018 FINDINGS: Single frontal view of the chest demonstrates stable enlargement of the cardiac silhouette. There is focal consolidation within the right midlung zone overlying the right anterior fourth rib. No effusion or pneumothorax. Postsurgical changes left axilla. No acute bony abnormalities. IMPRESSION: 1. Focal airspace disease right midlung zone, consistent with bronchopneumonia. Electronically Signed  By: Randa Ngo M.D.   On: 02/08/2020 19:02   Dialysis orders:  [facility] TTS 1st shift, 3.5hrs, RUE AVF, BFR 425/DFR 800 2K/2.25Ca, EDW 55.5kg hectorol 1qtx Heparin 2000u start of HD mircera 100q2wks, last dose 7/13, venofer 100 IV x 5 doses, has rec'd 2. 7/15 Hb 10.3 7/8 phos 4.2 12/2019 PTH 348 7/20 tx post wt 56.5kg, usually gets to EDW   Assessment/Plan **RLL PNA:  Being treated for CAP, COVID neg (unvaccinated).  Seems stable, per primary.   **ESRD on HD:  TTS on schedule today.  UF to EDW - says she can stand to weight.    **Anemia of ESRD:  Hb in 9s, hold IV iron with infection.  On ESA outpt.   **BMM:  Phos, corr ca, PTH ok outpt.  Cont hectorol 1 TIW here.  Cont ca acetate.   **DM type 1, complicated by hypoglycemia:  Likely due to poor po intake/infection. Insulin per primary  **HTN:  Normotensive, on home meds; hold prior to HD  Justin Mend 02/09/2020, 9:49 AM

## 2020-02-09 NOTE — ED Notes (Signed)
Breakfast Ordered--Rena Sweeden  

## 2020-02-09 NOTE — ED Notes (Signed)
Patient O2 sats noted in the high 80s, patient denies any difficulty breathing, patient reposition and pulse oximetry changed SpO2 92%.O2 applied at 2L via [place] SpO2 97%.

## 2020-02-09 NOTE — Progress Notes (Signed)
Hypoglycemic Event  CBG: 24  Treatment: D50 50 mL (25 gm)  Symptoms: None  Follow-up CBG: Time: 1650 CBG Result:114  Possible Reasons for Event: Inadequate meal intake  Comments/MD notified: Yes    [name] [place]

## 2020-02-09 NOTE — Progress Notes (Signed)
PROGRESS NOTE        PATIENT DETAILS Name: [name] Age: 44 y.o. Sex: female Date of Birth: 10/19/1975 Admit Date: 02/08/2020 Admitting Physician Antonieta Pert, MD DDU:[id number], Iona Beard, MD  Brief Narrative: Patient is a 44 y.o. female with history of DM-1 on insulin pump, ESRD on HD TTS, HTN-who presented with altered mental status secondary to hypoglycemia. Upon further evaluation in the emergency room-chest x-ray was consistent with pneumonia-subsequent blood cultures positive for gram-positive cocci in chains. Below for further details.  Note-not vaccinated against Covid-COVID-19 PCR negative on admission  Significant events: 7/21>> admit for unresponsiveness-hypoglycemia/hypothermia. Chest x-ray positive for pneumonia, blood cultures positive likely streptococcal bacteremia.  Significant studies: 7/21>> chest x-ray: Clear airspace disease right midlung-consistent with bronchopneumonia  Antimicrobial therapy: Rocephin: 7/21>> Zithromax: 7/21 x 1  Microbiology data: 7/21>> blood culture: Gram-positive cocci in chains 7/21>>SARS Coronavirus 2: Negative  Procedures : None  Consults: Nephrology  DVT Prophylaxis : heparin injection 5,000 Units Start: 02/08/20 2315   Subjective: Awake/alert-vomiting this morning.  Acknowledges URI/cough for almost a week.  Assessment/Plan: Acute metabolic encephalopathy secondary to hypoglycemia: Resolved-she is completely awake and alert.   Hypoglycemia-history of DM-1 on insulin pump (A1c 6.2 on 02/08/2020): Claims she woke up in an ambulance and was told she was hypoglycemic-no hypoglycemic episodes in the past few days-has not missed a meal. However she ran out of the monitoring device that notifies her for hypoglycemia several days back. Suspect hypoglycemia could have been triggered by poor oral intake in the setting of streptococcal bacteremia. CBGs are now relatively stable-insulin pump remains on  hold-given the fact that she is a type I diabetic-have added low-dose Lantus-continue SSI and follow. A1c is down to 6.2-suspect she may require some adjustment to her insulin regimen- will reach out to the diabetic coordinators for further input.  Streptococcal bacteremia: Preliminary blood culture suggestive of streptococcal infection-does has pneumonia findings on chest x-ray-along with cough/URI symptoms for at least a week. HD access site looks benign on my preliminary exam. Continue Rocephin-await the blood culture results-we will likely need to repeat blood cultures tomorrow. Once further cultures results are obtained-we will discuss with ID over the phone.  Vomiting: Likely secondary to bacteremia-abdominal exam appears benign-supportive care-follow-if persists-we can contemplate further work-up.  ESRD on HD TTS: Nephrology consulted and directing care.  HTN: BP stable-continue labetalol, amlodipine and losartan-follow BP closely-May need adjustment  Anemia likely secondary to chronic disease: Hemoglobin stable-defer IV iron/Aranesp to nephrology.  Diet: Diet Order            Diet renal with fluid restriction Fluid restriction: 1200 mL Fluid; Room service appropriate? Yes; Fluid consistency: Thin  Diet effective now                  Code Status: Full code   Family Communication: None at bedside  Disposition Plan: Status is: Observation  The patient will require care spanning > 2 midnights and should be moved to inpatient because: Inpatient level of care appropriate due to severity of illness  Dispo: The patient is from: Home              Anticipated d/c is to: Home              Anticipated d/c date is: > 3 days  Patient currently is not medically stable to d/c.   Barriers to Discharge: Gram-positive bacteremia requiring IV antibiotics  Antimicrobial agents: Anti-infectives (From admission, onward)   Start     Dose/Rate Route Frequency Ordered Stop    02/09/20 1800  cefTRIAXone (ROCEPHIN) 1 g in sodium chloride 0.9 % 100 mL IVPB  Status:  Discontinued        1 g 200 mL/hr over 30 Minutes Intravenous Every 24 hours 02/08/20 2311 02/09/20 1137   02/09/20 1800  doxycycline (VIBRAMYCIN) 100 mg in sodium chloride 0.9 % 250 mL IVPB  Status:  Discontinued        100 mg 125 mL/hr over 120 Minutes Intravenous Every 12 hours 02/08/20 2311 02/09/20 1137   02/09/20 1300  cefTRIAXone (ROCEPHIN) 2 g in sodium chloride 0.9 % 100 mL IVPB     Discontinue     2 g 200 mL/hr over 30 Minutes Intravenous Every 24 hours 02/09/20 1137     02/08/20 2030  cefTRIAXone (ROCEPHIN) 1 g in sodium chloride 0.9 % 100 mL IVPB        1 g 200 mL/hr over 30 Minutes Intravenous  Once 02/08/20 2027 02/08/20 2205   02/08/20 2030  azithromycin (ZITHROMAX) 500 mg in sodium chloride 0.9 % 250 mL IVPB        500 mg 250 mL/hr over 60 Minutes Intravenous  Once 02/08/20 2027 02/08/20 2346       Time spent: 35 minutes-Greater than 50% of this time was spent in counseling, explanation of diagnosis, planning of further management, and coordination of care.  MEDICATIONS: Scheduled Meds:  amLODipine  10 mg Oral QHS   aspirin EC  81 mg Oral QHS   calcium acetate  667 mg Oral TID WC   And   calcium acetate  667 mg Oral With snacks   Chlorhexidine Gluconate Cloth  6 each Topical Q0600   doxercalciferol  1 mcg Intravenous Q T,Th,Sa-HD   heparin  5,000 Units Subcutaneous Q8H   insulin aspart  0-6 Units Subcutaneous TID WC   insulin glargine  12 Units Subcutaneous Daily   labetalol  300 mg Oral QPM   losartan  25 mg Oral BID   multivitamin with minerals  1 tablet Oral q AM   rosuvastatin  10 mg Oral QHS   Continuous Infusions:  cefTRIAXone (ROCEPHIN)  IV     PRN Meds:.acetaminophen, loratadine   PHYSICAL EXAM: Vital signs: Vitals:   02/09/20 0615 02/09/20 0701 02/09/20 0923 02/09/20 0930  BP: 123/64 (!) 118/57  106/68  Pulse:  63  70  Resp: (!) 21 16  19     Temp:   98.2 F (36.8 C)   TempSrc:   Oral   SpO2:  98%  92%  Weight:   56.5 kg   Height:   5\' 3"  (1.6 m)    Filed Weights   02/08/20 1605 02/09/20 0410 02/09/20 0923  Weight: 58.8 kg 56.5 kg 56.5 kg   Body mass index is 22.06 kg/m.   Gen Exam:Alert awake-not in any distress HEENT:atraumatic, normocephalic Chest: B/L clear to auscultation anteriorly CVS:S1S2 regular Abdomen:soft non tender, non distended Extremities:no edema Neurology: Non focal Skin: no rash  I have personally reviewed following labs and imaging studies  LABORATORY DATA: CBC: Recent Labs  Lab 02/08/20 1715 02/08/20 2355 02/09/20 0405  WBC 8.6 9.4 9.7  NEUTROABS 7.6  --   --   HGB 10.1* 10.1* 9.5*  HCT 34.0* 33.7* 31.4*  MCV 84.4 83.6 85.1  PLT 213 251 428    Basic Metabolic Panel: Recent Labs  Lab 02/08/20 1715 02/08/20 2355 02/09/20 0405  NA 136  --  136  K 3.9  --  3.6  CL 96*  --  95*  CO2 27  --  22  GLUCOSE 190*  --  179*  BUN 16  --  23*  CREATININE 4.61* 5.08* 5.42*  CALCIUM 9.0  --  8.8*    GFR: Estimated Creatinine Clearance: 11.3 mL/min (A) (by C-G formula based on SCr of 5.42 mg/dL (H)).  Liver Function Tests: No results for input(s): AST, ALT, ALKPHOS, BILITOT, PROT, ALBUMIN in the last 168 hours. No results for input(s): LIPASE, AMYLASE in the last 168 hours. No results for input(s): AMMONIA in the last 168 hours.  Coagulation Profile: No results for input(s): INR, PROTIME in the last 168 hours.  Cardiac Enzymes: No results for input(s): CKTOTAL, CKMB, CKMBINDEX, TROPONINI in the last 168 hours.  BNP (last 3 results) No results for input(s): PROBNP in the last 8760 hours.  Lipid Profile: No results for input(s): CHOL, HDL, LDLCALC, TRIG, CHOLHDL, LDLDIRECT in the last 72 hours.  Thyroid Function Tests: No results for input(s): TSH, T4TOTAL, FREET4, T3FREE, THYROIDAB in the last 72 hours.  Anemia Panel: No results for input(s): VITAMINB12, FOLATE,  FERRITIN, TIBC, IRON, RETICCTPCT in the last 72 hours.  Urine analysis:    Component Value Date/Time   COLORURINE YELLOW 03/29/2014 1734   APPEARANCEUR CLEAR 03/29/2014 1734   LABSPEC 1.015 03/29/2014 1734   PHURINE 8.0 03/29/2014 1734   GLUCOSEU >1000 (A) 03/29/2014 1734   HGBUR SMALL (A) 03/29/2014 1734   BILIRUBINUR NEGATIVE 03/29/2014 1734   KETONESUR NEGATIVE 03/29/2014 1734   PROTEINUR 100 (A) 03/29/2014 1734   UROBILINOGEN 0.2 03/29/2014 1734   NITRITE NEGATIVE 03/29/2014 1734   LEUKOCYTESUR TRACE (A) 03/29/2014 1734    Sepsis Labs: Lactic Acid, Venous    Component Value Date/Time   LATICACIDVEN 1.5 02/09/2020 0959    MICROBIOLOGY: Recent Results (from the past 240 hour(s))  Culture, blood (routine x 2)     Status: None (Preliminary result)   Collection Time: 02/08/20  6:04 PM   Specimen: BLOOD  Result Value Ref Range Status   Specimen Description BLOOD SITE NOT SPECIFIED  Final   Special Requests   Final    BOTTLES DRAWN AEROBIC AND ANAEROBIC Blood Culture results may not be optimal due to an inadequate volume of blood received in culture bottles   Culture  Setup Time   Final    GRAM POSITIVE COCCI IN CHAINS ANAEROBIC BOTTLE ONLY CRITICAL VALUE NOTED.  VALUE IS CONSISTENT WITH PREVIOUSLY REPORTED AND CALLED VALUE. Performed at [facility], [place] [address], [place], [place] [address]    Culture GRAM POSITIVE COCCI  Final   Report Status PENDING  Incomplete  Culture, blood (routine x 2)     Status: None (Preliminary result)   Collection Time: 02/08/20  6:15 PM   Specimen: BLOOD  Result Value Ref Range Status   Specimen Description BLOOD LEFT ANTECUBITAL  Final   Special Requests   Final    BOTTLES DRAWN AEROBIC AND ANAEROBIC Blood Culture adequate volume   Culture  Setup Time   Final    GRAM POSITIVE COCCI IN CHAINS ANAEROBIC BOTTLE ONLY CRITICAL RESULT CALLED TO, READ BACK BY AND VERIFIED WITH: [place]. [id number] FCP Performed at [facility], [place] [address], [place], [place] [address]  Culture GRAM POSITIVE COCCI  Final   Report Status PENDING  Incomplete  Blood Culture ID Panel (Reflexed)     Status: Abnormal   Collection Time: 02/08/20  6:15 PM  Result Value Ref Range Status   Enterococcus species NOT DETECTED NOT DETECTED Final   Listeria monocytogenes NOT DETECTED NOT DETECTED Final   Staphylococcus species NOT DETECTED NOT DETECTED Final   Staphylococcus aureus (BCID) NOT DETECTED NOT DETECTED Final   Streptococcus species DETECTED (A) NOT DETECTED Final    Comment: Not Enterococcus species, Streptococcus agalactiae, Streptococcus pyogenes, or Streptococcus pneumoniae. CRITICAL RESULT CALLED TO, READ BACK BY AND VERIFIED WITH: PHARMD JEREMY F. [id number] FCP    Streptococcus agalactiae NOT DETECTED NOT DETECTED Final   Streptococcus pneumoniae NOT DETECTED NOT DETECTED Final   Streptococcus pyogenes NOT DETECTED NOT DETECTED Final   Acinetobacter baumannii NOT DETECTED NOT DETECTED Final   Enterobacteriaceae species NOT DETECTED NOT DETECTED Final   Enterobacter cloacae complex NOT DETECTED NOT DETECTED Final   Escherichia coli NOT DETECTED NOT DETECTED Final   Klebsiella oxytoca NOT DETECTED NOT DETECTED Final   Klebsiella pneumoniae NOT DETECTED NOT DETECTED Final   Proteus species NOT DETECTED NOT DETECTED Final   Serratia marcescens NOT DETECTED NOT DETECTED Final   Haemophilus influenzae NOT DETECTED NOT DETECTED Final   Neisseria meningitidis NOT DETECTED NOT DETECTED Final   Pseudomonas aeruginosa NOT DETECTED NOT DETECTED Final   Candida albicans NOT DETECTED NOT DETECTED Final   Candida glabrata NOT DETECTED NOT DETECTED Final   Candida krusei NOT DETECTED NOT DETECTED Final   Candida parapsilosis NOT DETECTED NOT DETECTED Final   Candida tropicalis NOT DETECTED NOT DETECTED Final    Comment: Performed at [facility], [place] [address], [place], [place] [address]  SARS Coronavirus 2 by RT  PCR (hospital order, performed in [facility] hospital lab) Nasopharyngeal Nasopharyngeal Swab     Status: None   Collection Time: 02/08/20  8:48 PM   Specimen: Nasopharyngeal Swab  Result Value Ref Range Status   SARS Coronavirus 2 NEGATIVE NEGATIVE Final    Comment: (NOTE) SARS-CoV-2 target nucleic acids are NOT DETECTED.  The SARS-CoV-2 RNA is generally detectable in upper and lower respiratory specimens during the acute phase of infection. The lowest concentration of SARS-CoV-2 viral copies this assay can detect is 250 copies / mL. A negative result does not preclude SARS-CoV-2 infection and should not be used as the sole basis for treatment or other patient management decisions.  A negative result may occur with improper specimen collection / handling, submission of specimen other than nasopharyngeal swab, presence of viral mutation(s) within the areas targeted by this assay, and inadequate number of viral copies (<250 copies / mL). A negative result must be combined with clinical observations, patient history, and epidemiological information.  Fact Sheet for Patients:   StrictlyIdeas.no  Fact Sheet for Healthcare Providers: BankingDealers.co.za  This test is not yet approved or  cleared by the [place] FDA and has been authorized for detection and/or diagnosis of SARS-CoV-2 by FDA under an Emergency Use Authorization (EUA).  This EUA will remain in effect (meaning this test can be used) for the duration of the COVID-19 declaration under Section 564(b)(1) of the Act, 21 U.S.C. section 360bbb-3(b)(1), unless the authorization is terminated or revoked sooner.  Performed at [facility], [place] [address], [place], [place] [address]     RADIOLOGY STUDIES/RESULTS: DG Chest Portable 1 View  Result Date: 02/08/2020 CLINICAL DATA:  Weakness, unresponsive EXAM: PORTABLE CHEST 1 VIEW COMPARISON:  04/15/2018 FINDINGS: Single  frontal view of the chest demonstrates stable enlargement of the cardiac silhouette. There is focal consolidation within the right midlung zone overlying the right anterior fourth rib. No effusion or pneumothorax. Postsurgical changes left axilla. No acute bony abnormalities. IMPRESSION: 1. Focal airspace disease right midlung zone, consistent with bronchopneumonia. Electronically Signed   By: Randa Ngo M.D.   On: 02/08/2020 19:02     LOS: 0 days   Oren Binet, MD  [facility]    To contact the attending provider between 7A-7P or the covering provider during after hours 7P-7A, please log into the web site [email or web address] and access using universal [place] password for that web site. If you do not have the password, please call the hospital operator.  02/09/2020, 11:52 AM

## 2020-02-10 ENCOUNTER — Inpatient Hospital Stay (HOSPITAL_COMMUNITY): Payer: Medicare HMO

## 2020-02-10 DIAGNOSIS — N186 End stage renal disease: Secondary | ICD-10-CM | POA: Diagnosis not present

## 2020-02-10 DIAGNOSIS — J189 Pneumonia, unspecified organism: Secondary | ICD-10-CM | POA: Diagnosis not present

## 2020-02-10 DIAGNOSIS — E1022 Type 1 diabetes mellitus with diabetic chronic kidney disease: Secondary | ICD-10-CM | POA: Diagnosis not present

## 2020-02-10 DIAGNOSIS — R7881 Bacteremia: Secondary | ICD-10-CM | POA: Diagnosis not present

## 2020-02-10 LAB — GLUCOSE, CAPILLARY
Glucose-Capillary: 109 mg/dL — ABNORMAL HIGH (ref 70–99)
Glucose-Capillary: 111 mg/dL — ABNORMAL HIGH (ref 70–99)
Glucose-Capillary: 112 mg/dL — ABNORMAL HIGH (ref 70–99)
Glucose-Capillary: 116 mg/dL — ABNORMAL HIGH (ref 70–99)
Glucose-Capillary: 120 mg/dL — ABNORMAL HIGH (ref 70–99)
Glucose-Capillary: 126 mg/dL — ABNORMAL HIGH (ref 70–99)
Glucose-Capillary: 214 mg/dL — ABNORMAL HIGH (ref 70–99)
Glucose-Capillary: 221 mg/dL — ABNORMAL HIGH (ref 70–99)
Glucose-Capillary: 222 mg/dL — ABNORMAL HIGH (ref 70–99)
Glucose-Capillary: 286 mg/dL — ABNORMAL HIGH (ref 70–99)
Glucose-Capillary: 42 mg/dL — CL (ref 70–99)
Glucose-Capillary: 43 mg/dL — CL (ref 70–99)
Glucose-Capillary: 47 mg/dL — ABNORMAL LOW (ref 70–99)
Glucose-Capillary: 71 mg/dL (ref 70–99)

## 2020-02-10 LAB — CBC WITH DIFFERENTIAL/PLATELET
Abs Immature Granulocytes: 0.02 10*3/uL (ref 0.00–0.07)
Basophils Absolute: 0.1 10*3/uL (ref 0.0–0.1)
Basophils Relative: 1 %
Eosinophils Absolute: 0.2 10*3/uL (ref 0.0–0.5)
Eosinophils Relative: 3 %
HCT: 35.5 % — ABNORMAL LOW (ref 36.0–46.0)
Hemoglobin: 10.8 g/dL — ABNORMAL LOW (ref 12.0–15.0)
Immature Granulocytes: 0 %
Lymphocytes Relative: 16 %
Lymphs Abs: 1 10*3/uL (ref 0.7–4.0)
MCH: 25.4 pg — ABNORMAL LOW (ref 26.0–34.0)
MCHC: 30.4 g/dL (ref 30.0–36.0)
MCV: 83.3 fL (ref 80.0–100.0)
Monocytes Absolute: 0.4 10*3/uL (ref 0.1–1.0)
Monocytes Relative: 6 %
Neutro Abs: 4.4 10*3/uL (ref 1.7–7.7)
Neutrophils Relative %: 74 %
Platelets: 227 10*3/uL (ref 150–400)
RBC: 4.26 MIL/uL (ref 3.87–5.11)
RDW: 18.5 % — ABNORMAL HIGH (ref 11.5–15.5)
WBC: 6 10*3/uL (ref 4.0–10.5)
nRBC: 0 % (ref 0.0–0.2)

## 2020-02-10 LAB — RENAL FUNCTION PANEL
Albumin: 3.1 g/dL — ABNORMAL LOW (ref 3.5–5.0)
Anion gap: 13 (ref 5–15)
BUN: 10 mg/dL (ref 6–20)
CO2: 29 mmol/L (ref 22–32)
Calcium: 9.3 mg/dL (ref 8.9–10.3)
Chloride: 96 mmol/L — ABNORMAL LOW (ref 98–111)
Creatinine, Ser: 3.44 mg/dL — ABNORMAL HIGH (ref 0.44–1.00)
GFR calc Af Amer: 18 mL/min — ABNORMAL LOW (ref >60)
GFR calc non Af Amer: 16 mL/min — ABNORMAL LOW (ref >60)
Glucose, Bld: 65 mg/dL — ABNORMAL LOW (ref 70–99)
Phosphorus: 3.7 mg/dL (ref 2.5–4.6)
Potassium: 3.4 mmol/L — ABNORMAL LOW (ref 3.5–5.1)
Sodium: 138 mmol/L (ref 135–145)

## 2020-02-10 MED ORDER — DEXTROSE 50 % IV SOLN: 25.0000 g | INTRAVENOUS | Status: AC

## 2020-02-10 MED ORDER — POTASSIUM CHLORIDE 20 MEQ/15ML (10%) PO SOLN: 20.0000 meq | Freq: Once | ORAL | Status: DC

## 2020-02-10 MED ORDER — GLUCOSE 40 % PO GEL
2.0000 | ORAL | Status: AC
  Administered 2020-02-10: 75 g via ORAL

## 2020-02-10 MED ORDER — GLUCOSE 40 % PO GEL
ORAL | Status: AC
  Administered 2020-02-10: 37.5 g
  Filled 2020-02-10: qty 2

## 2020-02-10 MED ORDER — POTASSIUM CHLORIDE CRYS ER 20 MEQ PO TBCR
20.0000 meq | EXTENDED_RELEASE_TABLET | Freq: Once | ORAL | Status: DC
  Filled 2020-02-10: qty 1

## 2020-02-10 MED ORDER — DEXTROSE 50 % IV SOLN
12.5000 g | INTRAVENOUS | Status: AC
  Administered 2020-02-10: 12.5 g via INTRAVENOUS

## 2020-02-10 MED ORDER — DEXTROSE 50 % IV SOLN
INTRAVENOUS | Status: AC
  Filled 2020-02-10: qty 50

## 2020-02-10 NOTE — Progress Notes (Signed)
[facility] Progress Note   Subjective:   Tolerated HD well yesterday.  Feeling improved. RLL infiltrate unchanged with HD --> c/w PNA.   Objective Vitals:   02/09/20 1530 02/09/20 2101 02/09/20 2128 02/10/20 0919  BP: (!) 136/74 (!) 133/61  (!) 138/65  Pulse: 70 72  70  Resp: 15 16 18 17   Temp:  98.1 F (36.7 C)  (!) 97.5 F (36.4 C)  TempSrc:  Oral  Oral  SpO2:  91% 94% 93%  Weight:      Height:       Physical Exam General: well appearing Heart: RRR Lungs: normal WOB on RA Extremities: no edema Dialysis Access:  RUE AVG dressing removed = no issues  Additional Objective Labs: Basic Metabolic Panel: Recent Labs  Lab 02/08/20 1715 02/08/20 1715 02/08/20 2355 02/09/20 0405 02/10/20 0907  NA 136  --   --  136 138  K 3.9  --   --  3.6 3.4*  CL 96*  --   --  95* 96*  CO2 27  --   --  22 29  GLUCOSE 190*  --   --  179* 65*  BUN 16  --   --  23* 10  CREATININE 4.61*   < > 5.08* 5.42* 3.44*  CALCIUM 9.0  --   --  8.8* 9.3  PHOS  --   --   --   --  3.7   < > = values in this interval not displayed.   Liver Function Tests: Recent Labs  Lab 02/10/20 0907  ALBUMIN 3.1*   No results for input(s): LIPASE, AMYLASE in the last 168 hours. CBC: Recent Labs  Lab 02/08/20 1715 02/08/20 1715 02/08/20 2355 02/09/20 0405 02/10/20 0907  WBC 8.6   < > 9.4 9.7 6.0  NEUTROABS 7.6  --   --   --  4.4  HGB 10.1*   < > 10.1* 9.5* 10.8*  HCT 34.0*   < > 33.7* 31.4* 35.5*  MCV 84.4  --  83.6 85.1 83.3  PLT 213   < > 251 249 227   < > = values in this interval not displayed.   Blood Culture    Component Value Date/Time   SDES BLOOD LEFT ANTECUBITAL 02/08/2020 1815   SPECREQUEST  02/08/2020 1815    BOTTLES DRAWN AEROBIC AND ANAEROBIC Blood Culture adequate volume   CULT GRAM POSITIVE COCCI 02/08/2020 1815   REPTSTATUS PENDING 02/08/2020 1815    Cardiac Enzymes: No results for input(s): CKTOTAL, CKMB, CKMBINDEX, TROPONINI in the last 168  hours. CBG: Recent Labs  Lab 02/10/20 0356 02/10/20 0459 02/10/20 0534 02/10/20 0608 02/10/20 0821  GLUCAP 126* 112* 120* 109* 71   Iron Studies: No results for input(s): IRON, TIBC, TRANSFERRIN, FERRITIN in the last 72 hours. @lablastinr3 @ Studies/Results: DG Chest Port 1 View  Result Date: 02/10/2020 CLINICAL DATA:  Shortness of breath. EXAM: PORTABLE CHEST 1 VIEW COMPARISON:  02/08/2020. FINDINGS: Mediastinum hilar structures normal. Progressive infiltrate in the right mid lung consistent with progression of pneumonia noted. Stable cardiomegaly. Tiny right pleural effusion cannot be excluded. No pneumothorax. Surgical clips left chest. IMPRESSION: 1. Progressive infiltrate right mid lung consistent with progression of pneumonia. Tiny right pleural effusion cannot be excluded. 2.  Stable cardiomegaly. Electronically Signed   By: Marcello Moores  Register   On: 02/10/2020 07:02   DG Chest Portable 1 View  Result Date: 02/08/2020 CLINICAL DATA:  Weakness, unresponsive EXAM: PORTABLE CHEST 1 VIEW COMPARISON:  04/15/2018 FINDINGS:  Single frontal view of the chest demonstrates stable enlargement of the cardiac silhouette. There is focal consolidation within the right midlung zone overlying the right anterior fourth rib. No effusion or pneumothorax. Postsurgical changes left axilla. No acute bony abnormalities. IMPRESSION: 1. Focal airspace disease right midlung zone, consistent with bronchopneumonia. Electronically Signed   By: Randa Ngo M.D.   On: 02/08/2020 19:02   Medications: . cefTRIAXone (ROCEPHIN)  IV     . amLODipine  10 mg Oral QHS  . aspirin EC  81 mg Oral QHS  . calcium acetate  667 mg Oral TID WC   And  . calcium acetate  667 mg Oral With snacks  . Chlorhexidine Gluconate Cloth  6 each Topical Q0600  . dextrose  25 g Intravenous STAT  . dextrose      . doxercalciferol  1 mcg Intravenous Q T,Th,Sa-HD  . heparin  5,000 Units Subcutaneous Q8H  . insulin aspart  0-6 Units  Subcutaneous TID WC  . labetalol  300 mg Oral QPM  . losartan  25 mg Oral BID  . multivitamin with minerals  1 tablet Oral q AM  . rosuvastatin  10 mg Oral QHS    Dialysis orders:  [facility] TTS 1st shift, 3.5hrs, RUE AVF, BFR 425/DFR 800 2K/2.25Ca, EDW 55.5kg hectorol 1qtx Heparin 2000u start of HD mircera 100q2wks, last dose 7/13, venofer 100 IV x 5 doses, has rec'd 2. 7/15 Hb 10.3 7/8 phos 4.2 12/2019 PTH 348 7/20 tx post wt 56.5kg, usually gets to EDW   Assessment/Plan **RLL PNA:  Being treated for CAP, COVID neg (unvaccinated).  Seems stable, per primary.   **Bacteremia:  Await final cultures; can likely complete course of IV abx at HD just need to make sure we have the right one on formulary.  **ESRD on HD:  TTS on schedule yesterday.  Plan next tomorrow.   **Anemia of ESRD:  Hb in 9s, hold IV iron with infection.  On ESA outpt.   **BMM:  Phos, corr ca, PTH ok outpt.  Cont hectorol 1 TIW here.  Cont ca acetate.   **DM type 1, complicated by hypoglycemia:  Likely due to poor po intake/infection. Insulin per primary  **HTN:  Normotensive, on home meds; hold prior to HD  Jannifer Hick MD 02/10/2020, 10:29 AM  [facility] Pager: [phone]

## 2020-02-10 NOTE — Progress Notes (Signed)
Chaplain engaged in initial visit with [place].  Chaplain asked what Tangi would like to uplift up in prayer and she noted her health.  She expressed to chaplain what her current condition is and that she feels much better.  Chaplain offered prayer over [place] and later realized that she is Catholic.  Chaplain offered to have her priest come or to work out an Designer, jewellery and [place] declined.  Chaplain offered support.   Chaplain will follow-up as needed.

## 2020-02-10 NOTE — Progress Notes (Addendum)
PROGRESS NOTE        PATIENT DETAILS Name: [name] Age: 44 y.o. Sex: female Date of Birth: 02-17-1976 Admit Date: 02/08/2020 Admitting Physician Antonieta Pert, MD WHQ:[id number], Iona Beard, MD  Brief Narrative: Patient is a 44 y.o. female with history of DM-1 on insulin pump, ESRD on HD TTS, HTN-who presented with altered mental status secondary to hypoglycemia. Upon further evaluation in the emergency room-chest x-ray was consistent with pneumonia-subsequent blood cultures positive for gram-positive cocci in chains. Below for further details.  Note-not vaccinated against Covid-COVID-19 PCR negative on admission  Significant events: 7/21>> admit for unresponsiveness-hypoglycemia/hypothermia. Chest x-ray positive for pneumonia, blood cultures positive likely streptococcal bacteremia.  Significant studies: 7/23>> chest x-ray:. Progressive infiltrate right mid lung consistent with progression of pneumonia. 7/21>> chest x-ray:  airspace disease right midlung-consistent with bronchopneumonia  Antimicrobial therapy: Rocephin: 7/21>> Zithromax: 7/21 x 1  Microbiology data: 7/23>> blood culture: Pending 7/21>> blood culture: Gram-positive cocci in chains 7/21>>SARS Coronavirus 2: Negative  Procedures : None  Consults: Nephrology  DVT Prophylaxis : heparin injection 5,000 Units Start: 02/08/20 2315   Subjective: Feels better-no vomiting-had more hypoglycemic episodes overnight.  Assessment/Plan: Acute metabolic encephalopathy secondary to hypoglycemia: Resolved-she is completely awake and alert.   Hypoglycemia-history of DM-1 on insulin pump (A1c 6.2 on 02/08/2020): Claims she woke up in an ambulance on the day of admission-and was told she was hypoglycemic.  She is maintained on an insulin pump-Per patient-she ran out of her monitoring device a few days back that notifies her of episodes of hypoglycemia.  Given streptococcal bacteremia-suspect she  had poor oral intake that likely contributed to hypoglycemic episode on presentation.  She continues to have hypoglycemic episodes overnight-decrease Lantus to 5 units, continue with very sensitive SSI.  Appreciate diabetic coordinator input-will follow closely and continue to optimize as much as we can.    Streptococcal bacteremia: Preliminary blood culture suggestive of streptococcal infection-does has pneumonia findings on chest x-ray-along with cough/URI symptoms for at least a week. HD access site looks benign.  Continue Rocephin-await final culture results-repeat blood culture today.    Vomiting: Resolved.  Likely secondary to bacteremia-abdominal exam appears benign  ESRD on HD TTS: Nephrology consulted and directing care.  HTN: BP stable-continue labetalol, amlodipine and losartan-follow and adjust accordingly.  Anemia likely secondary to chronic disease: Hemoglobin stable-defer IV iron/Aranesp to nephrology.  Diet: Diet Order            Diet renal with fluid restriction Fluid restriction: 1200 mL Fluid; Room service appropriate? Yes; Fluid consistency: Thin  Diet effective now                  Code Status: Full code   Family Communication: None at bedside  Disposition Plan: Status is: Inpatient   Inpatient level of care appropriate due to severity of illness  Dispo: The patient is from: Home              Anticipated d/c is to: Home              Anticipated d/c date is: > 3 days              Patient currently is not medically stable to d/c.   Barriers to Discharge: Gram-positive bacteremia requiring IV antibiotics  Antimicrobial agents: Anti-infectives (From admission, onward)   Start     Dose/Rate Route  Frequency Ordered Stop   02/09/20 1800  cefTRIAXone (ROCEPHIN) 1 g in sodium chloride 0.9 % 100 mL IVPB  Status:  Discontinued        1 g 200 mL/hr over 30 Minutes Intravenous Every 24 hours 02/08/20 2311 02/09/20 1137   02/09/20 1800  doxycycline (VIBRAMYCIN)  100 mg in sodium chloride 0.9 % 250 mL IVPB  Status:  Discontinued        100 mg 125 mL/hr over 120 Minutes Intravenous Every 12 hours 02/08/20 2311 02/09/20 1137   02/09/20 1300  cefTRIAXone (ROCEPHIN) 2 g in sodium chloride 0.9 % 100 mL IVPB     Discontinue     2 g 200 mL/hr over 30 Minutes Intravenous Every 24 hours 02/09/20 1137     02/08/20 2030  cefTRIAXone (ROCEPHIN) 1 g in sodium chloride 0.9 % 100 mL IVPB        1 g 200 mL/hr over 30 Minutes Intravenous  Once 02/08/20 2027 02/08/20 2205   02/08/20 2030  azithromycin (ZITHROMAX) 500 mg in sodium chloride 0.9 % 250 mL IVPB        500 mg 250 mL/hr over 60 Minutes Intravenous  Once 02/08/20 2027 02/08/20 2346       Time spent: 25 minutes-Greater than 50% of this time was spent in counseling, explanation of diagnosis, planning of further management, and coordination of care.  MEDICATIONS: Scheduled Meds: . amLODipine  10 mg Oral QHS  . aspirin EC  81 mg Oral QHS  . calcium acetate  667 mg Oral TID WC   And  . calcium acetate  667 mg Oral With snacks  . Chlorhexidine Gluconate Cloth  6 each Topical Q0600  . dextrose  25 g Intravenous STAT  . doxercalciferol  1 mcg Intravenous Q T,Th,Sa-HD  . heparin  5,000 Units Subcutaneous Q8H  . insulin aspart  0-6 Units Subcutaneous TID WC  . labetalol  300 mg Oral QPM  . losartan  25 mg Oral BID  . multivitamin with minerals  1 tablet Oral q AM  . rosuvastatin  10 mg Oral QHS   Continuous Infusions: . cefTRIAXone (ROCEPHIN)  IV     PRN Meds:.acetaminophen, loratadine, ondansetron (ZOFRAN) IV   PHYSICAL EXAM: Vital signs: Vitals:   02/09/20 1530 02/09/20 2101 02/09/20 2128 02/10/20 0919  BP: (!) 136/74 (!) 133/61  (!) 138/65  Pulse: 70 72  70  Resp: 15 16 18 17   Temp:  98.1 F (36.7 C)  (!) 97.5 F (36.4 C)  TempSrc:  Oral  Oral  SpO2:  91% 94% 93%  Weight:      Height:       Filed Weights   02/09/20 0410 02/09/20 0923 02/09/20 1215  Weight: 56.5 kg 56.5 kg 55.9 kg    Body mass index is 21.83 kg/m.   Gen Exam:Alert awake-not in any distress HEENT:atraumatic, normocephalic Chest: B/L clear to auscultation anteriorly CVS:S1S2 regular Abdomen:soft non tender, non distended Extremities:no edema Neurology: Non focal Skin: no rash  I have personally reviewed following labs and imaging studies  LABORATORY DATA: CBC: Recent Labs  Lab 02/08/20 1715 02/08/20 2355 02/09/20 0405 02/10/20 0907  WBC 8.6 9.4 9.7 6.0  NEUTROABS 7.6  --   --  4.4  HGB 10.1* 10.1* 9.5* 10.8*  HCT 34.0* 33.7* 31.4* 35.5*  MCV 84.4 83.6 85.1 83.3  PLT 213 251 249 053    Basic Metabolic Panel: Recent Labs  Lab 02/08/20 1715 02/08/20 2355 02/09/20 0405 02/10/20 0907  NA  136  --  136 138  K 3.9  --  3.6 3.4*  CL 96*  --  95* 96*  CO2 27  --  22 29  GLUCOSE 190*  --  179* 65*  BUN 16  --  23* 10  CREATININE 4.61* 5.08* 5.42* 3.44*  CALCIUM 9.0  --  8.8* 9.3  PHOS  --   --   --  3.7    GFR: Estimated Creatinine Clearance: 17.8 mL/min (A) (by C-G formula based on SCr of 3.44 mg/dL (H)).  Liver Function Tests: Recent Labs  Lab 02/10/20 0907  ALBUMIN 3.1*   No results for input(s): LIPASE, AMYLASE in the last 168 hours. No results for input(s): AMMONIA in the last 168 hours.  Coagulation Profile: No results for input(s): INR, PROTIME in the last 168 hours.  Cardiac Enzymes: No results for input(s): CKTOTAL, CKMB, CKMBINDEX, TROPONINI in the last 168 hours.  BNP (last 3 results) No results for input(s): PROBNP in the last 8760 hours.  Lipid Profile: No results for input(s): CHOL, HDL, LDLCALC, TRIG, CHOLHDL, LDLDIRECT in the last 72 hours.  Thyroid Function Tests: No results for input(s): TSH, T4TOTAL, FREET4, T3FREE, THYROIDAB in the last 72 hours.  Anemia Panel: No results for input(s): VITAMINB12, FOLATE, FERRITIN, TIBC, IRON, RETICCTPCT in the last 72 hours.  Urine analysis:    Component Value Date/Time   COLORURINE YELLOW 03/29/2014  1734   APPEARANCEUR CLEAR 03/29/2014 1734   LABSPEC 1.015 03/29/2014 1734   PHURINE 8.0 03/29/2014 1734   GLUCOSEU >1000 (A) 03/29/2014 1734   HGBUR SMALL (A) 03/29/2014 1734   BILIRUBINUR NEGATIVE 03/29/2014 1734   KETONESUR NEGATIVE 03/29/2014 1734   PROTEINUR 100 (A) 03/29/2014 1734   UROBILINOGEN 0.2 03/29/2014 1734   NITRITE NEGATIVE 03/29/2014 1734   LEUKOCYTESUR TRACE (A) 03/29/2014 1734    Sepsis Labs: Lactic Acid, Venous    Component Value Date/Time   LATICACIDVEN 1.5 02/09/2020 0959    MICROBIOLOGY: Recent Results (from the past 240 hour(s))  Culture, blood (routine x 2)     Status: None (Preliminary result)   Collection Time: 02/08/20  6:04 PM   Specimen: BLOOD  Result Value Ref Range Status   Specimen Description BLOOD SITE NOT SPECIFIED  Final   Special Requests   Final    BOTTLES DRAWN AEROBIC AND ANAEROBIC Blood Culture results may not be optimal due to an inadequate volume of blood received in culture bottles   Culture  Setup Time   Final    GRAM POSITIVE COCCI IN CHAINS IN BOTH AEROBIC AND ANAEROBIC BOTTLES CRITICAL VALUE NOTED.  VALUE IS CONSISTENT WITH PREVIOUSLY REPORTED AND CALLED VALUE.    Culture   Final    GRAM POSITIVE COCCI IDENTIFICATION TO FOLLOW Performed at [facility], [place] [address], [place], [place] [address]    Report Status PENDING  Incomplete  Culture, blood (routine x 2)     Status: None (Preliminary result)   Collection Time: 02/08/20  6:15 PM   Specimen: BLOOD  Result Value Ref Range Status   Specimen Description BLOOD LEFT ANTECUBITAL  Final   Special Requests   Final    BOTTLES DRAWN AEROBIC AND ANAEROBIC Blood Culture adequate volume   Culture  Setup Time   Final    GRAM POSITIVE COCCI IN CHAINS IN BOTH AEROBIC AND ANAEROBIC BOTTLES CRITICAL RESULT CALLED TO, READ BACK BY AND VERIFIED WITH: Mineral F. [id number] FCP    Culture   Final  GRAM POSITIVE COCCI IDENTIFICATION AND SUSCEPTIBILITIES TO  FOLLOW Performed at [facility], [place] [address], [place], [place] [address]    Report Status PENDING  Incomplete  Blood Culture ID Panel (Reflexed)     Status: Abnormal   Collection Time: 02/08/20  6:15 PM  Result Value Ref Range Status   Enterococcus species NOT DETECTED NOT DETECTED Final   Listeria monocytogenes NOT DETECTED NOT DETECTED Final   Staphylococcus species NOT DETECTED NOT DETECTED Final   Staphylococcus aureus (BCID) NOT DETECTED NOT DETECTED Final   Streptococcus species DETECTED (A) NOT DETECTED Final    Comment: Not Enterococcus species, Streptococcus agalactiae, Streptococcus pyogenes, or Streptococcus pneumoniae. CRITICAL RESULT CALLED TO, READ BACK BY AND VERIFIED WITH: PHARMD JEREMY F. [id number] FCP    Streptococcus agalactiae NOT DETECTED NOT DETECTED Final   Streptococcus pneumoniae NOT DETECTED NOT DETECTED Final   Streptococcus pyogenes NOT DETECTED NOT DETECTED Final   Acinetobacter baumannii NOT DETECTED NOT DETECTED Final   Enterobacteriaceae species NOT DETECTED NOT DETECTED Final   Enterobacter cloacae complex NOT DETECTED NOT DETECTED Final   Escherichia coli NOT DETECTED NOT DETECTED Final   Klebsiella oxytoca NOT DETECTED NOT DETECTED Final   Klebsiella pneumoniae NOT DETECTED NOT DETECTED Final   Proteus species NOT DETECTED NOT DETECTED Final   Serratia marcescens NOT DETECTED NOT DETECTED Final   Haemophilus influenzae NOT DETECTED NOT DETECTED Final   Neisseria meningitidis NOT DETECTED NOT DETECTED Final   Pseudomonas aeruginosa NOT DETECTED NOT DETECTED Final   Candida albicans NOT DETECTED NOT DETECTED Final   Candida glabrata NOT DETECTED NOT DETECTED Final   Candida krusei NOT DETECTED NOT DETECTED Final   Candida parapsilosis NOT DETECTED NOT DETECTED Final   Candida tropicalis NOT DETECTED NOT DETECTED Final    Comment: Performed at [facility], [place] [address], [place], [place] [address]  SARS Coronavirus 2 by RT PCR  (hospital order, performed in [facility] hospital lab) Nasopharyngeal Nasopharyngeal Swab     Status: None   Collection Time: 02/08/20  8:48 PM   Specimen: Nasopharyngeal Swab  Result Value Ref Range Status   SARS Coronavirus 2 NEGATIVE NEGATIVE Final    Comment: (NOTE) SARS-CoV-2 target nucleic acids are NOT DETECTED.  The SARS-CoV-2 RNA is generally detectable in upper and lower respiratory specimens during the acute phase of infection. The lowest concentration of SARS-CoV-2 viral copies this assay can detect is 250 copies / mL. A negative result does not preclude SARS-CoV-2 infection and should not be used as the sole basis for treatment or other patient management decisions.  A negative result may occur with improper specimen collection / handling, submission of specimen other than nasopharyngeal swab, presence of viral mutation(s) within the areas targeted by this assay, and inadequate number of viral copies (<250 copies / mL). A negative result must be combined with clinical observations, patient history, and epidemiological information.  Fact Sheet for Patients:   StrictlyIdeas.no  Fact Sheet for Healthcare Providers: BankingDealers.co.za  This test is not yet approved or  cleared by the [place] FDA and has been authorized for detection and/or diagnosis of SARS-CoV-2 by FDA under an Emergency Use Authorization (EUA).  This EUA will remain in effect (meaning this test can be used) for the duration of the COVID-19 declaration under Section 564(b)(1) of the Act, 21 U.S.C. section 360bbb-3(b)(1), unless the authorization is terminated or revoked sooner.  Performed at [facility], [place] [address], [place],  [address]  MRSA PCR Screening     Status: None   Collection Time: 02/09/20 11:32 AM  Result Value Ref Range Status   MRSA by PCR NEGATIVE NEGATIVE Final    Comment:        The GeneXpert MRSA Assay  (FDA approved for NASAL specimens only), is one component of a comprehensive MRSA colonization surveillance program. It is not intended to diagnose MRSA infection nor to guide or monitor treatment for MRSA infections. Performed at [facility], [place] [address], [place], [place] [address]   Respiratory Panel by PCR     Status: None   Collection Time: 02/09/20  5:30 PM   Specimen: Nasopharyngeal Swab; Respiratory  Result Value Ref Range Status   Adenovirus NOT DETECTED NOT DETECTED Final   Coronavirus 229E NOT DETECTED NOT DETECTED Final    Comment: (NOTE) The Coronavirus on the Respiratory Panel, DOES NOT test for the novel  Coronavirus (2019 nCoV)    Coronavirus HKU1 NOT DETECTED NOT DETECTED Final   Coronavirus NL63 NOT DETECTED NOT DETECTED Final   Coronavirus OC43 NOT DETECTED NOT DETECTED Final   Metapneumovirus NOT DETECTED NOT DETECTED Final   Rhinovirus / Enterovirus NOT DETECTED NOT DETECTED Final   Influenza A NOT DETECTED NOT DETECTED Final   Influenza B NOT DETECTED NOT DETECTED Final   Parainfluenza Virus 1 NOT DETECTED NOT DETECTED Final   Parainfluenza Virus 2 NOT DETECTED NOT DETECTED Final   Parainfluenza Virus 3 NOT DETECTED NOT DETECTED Final   Parainfluenza Virus 4 NOT DETECTED NOT DETECTED Final   Respiratory Syncytial Virus NOT DETECTED NOT DETECTED Final   Bordetella pertussis NOT DETECTED NOT DETECTED Final   Chlamydophila pneumoniae NOT DETECTED NOT DETECTED Final   Mycoplasma pneumoniae NOT DETECTED NOT DETECTED Final    Comment: Performed at [facility], [place]. [address] [place], [place] [address]    RADIOLOGY STUDIES/RESULTS: DG Chest Port 1 View  Result Date: 02/10/2020 CLINICAL DATA:  Shortness of breath. EXAM: PORTABLE CHEST 1 VIEW COMPARISON:  02/08/2020. FINDINGS: Mediastinum hilar structures normal. Progressive infiltrate in the right mid lung consistent with progression of pneumonia noted. Stable cardiomegaly. Tiny right pleural  effusion cannot be excluded. No pneumothorax. Surgical clips left chest. IMPRESSION: 1. Progressive infiltrate right mid lung consistent with progression of pneumonia. Tiny right pleural effusion cannot be excluded. 2.  Stable cardiomegaly. Electronically Signed   By: Marcello Moores  Register   On: 02/10/2020 07:02   DG Chest Portable 1 View  Result Date: 02/08/2020 CLINICAL DATA:  Weakness, unresponsive EXAM: PORTABLE CHEST 1 VIEW COMPARISON:  04/15/2018 FINDINGS: Single frontal view of the chest demonstrates stable enlargement of the cardiac silhouette. There is focal consolidation within the right midlung zone overlying the right anterior fourth rib. No effusion or pneumothorax. Postsurgical changes left axilla. No acute bony abnormalities. IMPRESSION: 1. Focal airspace disease right midlung zone, consistent with bronchopneumonia. Electronically Signed   By: Randa Ngo M.D.   On: 02/08/2020 19:02     LOS: 1 day   Oren Binet, MD  [facility]    To contact the attending provider between 7A-7P or the covering provider during after hours 7P-7A, please log into the web site [email or web address] and access using universal [facility] password for that web site. If you do not have the password, please call the hospital operator.  02/10/2020, 1:48 PM

## 2020-02-10 NOTE — Progress Notes (Signed)
Hypoglycemic Event  CBG: 42  Treatment: 2 tubes glucose gel  Symptoms: None  Follow-up CBG: Time:0217 CBG Result:47  Possible Reasons for Event: Inadequate meal intake  Comments/MD notified:Chotiner, MD  When hypoglycemic event occurred pt refused to take PO, stating it makes her nauseated. Glucose gel given to pt and CGB 47. IV team at the bedside trying to establish IV access. Ultra sound used.   In mean time pt agreed to take apple juice at 0240. Will repeat CBG in 15 minutes.  CBG 43, pt got IV access D50% 25g given. CBG 111 at 0320. MD notified.   Oliverio Cho N Luvina Poirier

## 2020-02-10 NOTE — Progress Notes (Signed)
CBG 73, pt refused food and drink at this time. Will continue to monitor.

## 2020-02-11 ENCOUNTER — Inpatient Hospital Stay (HOSPITAL_COMMUNITY): Payer: Medicare HMO

## 2020-02-11 DIAGNOSIS — J189 Pneumonia, unspecified organism: Secondary | ICD-10-CM | POA: Diagnosis not present

## 2020-02-11 DIAGNOSIS — R7881 Bacteremia: Secondary | ICD-10-CM | POA: Diagnosis not present

## 2020-02-11 DIAGNOSIS — E1022 Type 1 diabetes mellitus with diabetic chronic kidney disease: Secondary | ICD-10-CM | POA: Diagnosis not present

## 2020-02-11 DIAGNOSIS — Z992 Dependence on renal dialysis: Secondary | ICD-10-CM

## 2020-02-11 DIAGNOSIS — I371 Nonrheumatic pulmonary valve insufficiency: Secondary | ICD-10-CM

## 2020-02-11 DIAGNOSIS — I361 Nonrheumatic tricuspid (valve) insufficiency: Secondary | ICD-10-CM

## 2020-02-11 DIAGNOSIS — N186 End stage renal disease: Secondary | ICD-10-CM

## 2020-02-11 DIAGNOSIS — B955 Unspecified streptococcus as the cause of diseases classified elsewhere: Secondary | ICD-10-CM

## 2020-02-11 DIAGNOSIS — I120 Hypertensive chronic kidney disease with stage 5 chronic kidney disease or end stage renal disease: Secondary | ICD-10-CM

## 2020-02-11 HISTORY — DX: Bacteremia: R78.81

## 2020-02-11 LAB — GLUCOSE, CAPILLARY
Glucose-Capillary: 154 mg/dL — ABNORMAL HIGH (ref 70–99)
Glucose-Capillary: 170 mg/dL — ABNORMAL HIGH (ref 70–99)
Glucose-Capillary: 185 mg/dL — ABNORMAL HIGH (ref 70–99)
Glucose-Capillary: 202 mg/dL — ABNORMAL HIGH (ref 70–99)
Glucose-Capillary: 373 mg/dL — ABNORMAL HIGH (ref 70–99)

## 2020-02-11 LAB — RENAL FUNCTION PANEL
Albumin: 2.7 g/dL — ABNORMAL LOW (ref 3.5–5.0)
Anion gap: 13 (ref 5–15)
BUN: 19 mg/dL (ref 6–20)
CO2: 28 mmol/L (ref 22–32)
Calcium: 9.1 mg/dL (ref 8.9–10.3)
Chloride: 95 mmol/L — ABNORMAL LOW (ref 98–111)
Creatinine, Ser: 4.59 mg/dL — ABNORMAL HIGH (ref 0.44–1.00)
GFR calc Af Amer: 13 mL/min — ABNORMAL LOW
GFR calc non Af Amer: 11 mL/min — ABNORMAL LOW
Glucose, Bld: 171 mg/dL — ABNORMAL HIGH (ref 70–99)
Phosphorus: 3.9 mg/dL (ref 2.5–4.6)
Potassium: 3.6 mmol/L (ref 3.5–5.1)
Sodium: 136 mmol/L (ref 135–145)

## 2020-02-11 LAB — CULTURE, BLOOD (ROUTINE X 2): Special Requests: ADEQUATE

## 2020-02-11 LAB — CBC
HCT: 33.3 % — ABNORMAL LOW (ref 36.0–46.0)
Hemoglobin: 10 g/dL — ABNORMAL LOW (ref 12.0–15.0)
MCH: 25.6 pg — ABNORMAL LOW (ref 26.0–34.0)
MCHC: 30 g/dL (ref 30.0–36.0)
MCV: 85.2 fL (ref 80.0–100.0)
Platelets: 196 10*3/uL (ref 150–400)
RBC: 3.91 MIL/uL (ref 3.87–5.11)
RDW: 18.2 % — ABNORMAL HIGH (ref 11.5–15.5)
WBC: 5.8 10*3/uL (ref 4.0–10.5)
nRBC: 0 % (ref 0.0–0.2)

## 2020-02-11 LAB — ECHOCARDIOGRAM COMPLETE
Area-P 1/2: 2.99 cm2
Height: 63 in
S' Lateral: 2.9 cm
Weight: 1940.05 oz

## 2020-02-11 MED ORDER — DOXERCALCIFEROL 4 MCG/2ML IV SOLN
INTRAVENOUS | Status: AC
  Administered 2020-02-11: 1 ug via INTRAVENOUS
  Filled 2020-02-11: qty 2

## 2020-02-11 MED ORDER — HEPARIN SODIUM (PORCINE) 1000 UNIT/ML IJ SOLN
INTRAMUSCULAR | Status: AC
  Administered 2020-02-11: 2000 [IU]
  Filled 2020-02-11: qty 2

## 2020-02-11 MED ORDER — INSULIN GLARGINE 100 UNIT/ML ~~LOC~~ SOLN
6.0000 [IU] | Freq: Every day | SUBCUTANEOUS | Status: DC
  Administered 2020-02-12 – 2020-02-13 (×2): 6 [IU] via SUBCUTANEOUS
  Filled 2020-02-11 (×4): qty 0.06

## 2020-02-11 NOTE — Progress Notes (Addendum)
PROGRESS NOTE        PATIENT DETAILS Name: [name] Age: 44 y.o. Sex: female Date of Birth: 02-Aug-1975 Admit Date: 02/08/2020 Admitting Physician Antonieta Pert, MD [id number], Iona Beard, MD  Brief Narrative: Patient is a 44 y.o. female with history of DM-1 on insulin pump, ESRD on HD TTS, HTN-who presented with altered mental status secondary to hypoglycemia. Upon further evaluation in the emergency room-chest x-ray was consistent with pneumonia-subsequent blood cultures positive  streptococcal vestibularis. See Below for further details.  Note-not vaccinated against Covid-COVID-19 PCR negative on admission  Significant events: 7/21>> admit for unresponsiveness-hypoglycemia/hypothermia. Chest x-ray positive for pneumonia, blood cultures positive for streptococcal vestibularis bacteremia.  Significant studies: 7/24>>Echo:pending 7/23>> chest x-ray:. Progressive infiltrate right mid lung consistent with progression of pneumonia. 7/21>> chest x-ray:  airspace disease right midlung-consistent with bronchopneumonia  Antimicrobial therapy: Rocephin: 7/21>> Zithromax: 7/21 x 1  Microbiology data: 7/23>> blood culture: neg so far 7/21>> blood culture: Strep Vestibularis 7/21>>SARS Coronavirus 2: Negative  Procedures : None  Consults: Nephrology ID  DVT Prophylaxis : heparin injection 5,000 Units Start: 02/08/20 2315   Subjective: Feels better-no vomiting-had more hypoglycemic episodes overnight.  Assessment/Plan: Acute metabolic encephalopathy secondary to hypoglycemia: Resolved-she is completely awake and alert.   Hypoglycemia-history of DM-1 on insulin pump (A1c 6.2 on 02/08/2020): Claims she woke up in an ambulance on the day of admission-and was told she was hypoglycemic.  She is maintained on an insulin pump-Per patient-she ran out of her monitoring device a few days back that notifies her of episodes of hypoglycemia.  Given streptococcal  bacteremia-suspect she had poor oral intake that likely contributed to hypoglycemic episode on presentation.  No further hypoglycemic episodes-for the past 24 hours only maintained on SSI-we will start Lantus 6 units and see how she does.  Streptococcal vestibularis bacteremia: Initial blood cultures on admission positive for Streptococcus vestibularis-repeat blood culture on 7/23 - so far.  Remains on Rocephin-screening transthoracic echocardiogram ordered-have consulted ID for further guidance.  HD access site in the right upper arm looks benign on exam.  Has pneumonia on chest x-ray-along with clinical symptoms of cough/URI symptoms-suspect likely the foci for bacteremia.     Community-acquired pneumonia: Acknowledges cough/URI symptoms for a week or so prior to this hospital stay-chest x-ray confirms PNA (repeated after HD)-on Rocephin.  Clinically improved.  No fever or shortness of breath.  Vomiting: Resolved.  Likely secondary to bacteremia-abdominal exam appears benign  ESRD on HD TTS: Nephrology consulted and directing care.  HTN: BP elevated today-on HD-should improve post HD-continue labetalol, amlodipine and losartan-follow and adjust accordingly.  Anemia likely secondary to chronic disease: Hemoglobin stable-defer IV iron/Aranesp to nephrology.  Diet: Diet Order            Diet renal with fluid restriction Fluid restriction: 1200 mL Fluid; Room service appropriate? Yes; Fluid consistency: Thin  Diet effective now                  Code Status: Full code   Family Communication: Aunt over the phone on 7/24  Disposition Plan: Status is: Inpatient   Inpatient level of care appropriate due to severity of illness  Dispo: The patient is from: Home              Anticipated d/c is to: Home  Anticipated d/c date is: > 3 days              Patient currently is not medically stable to d/c.   Barriers to Discharge: Gram-positive bacteremia requiring IV  antibiotics  Antimicrobial agents: Anti-infectives (From admission, onward)   Start     Dose/Rate Route Frequency Ordered Stop   02/09/20 1800  cefTRIAXone (ROCEPHIN) 1 g in sodium chloride 0.9 % 100 mL IVPB  Status:  Discontinued        1 g 200 mL/hr over 30 Minutes Intravenous Every 24 hours 02/08/20 2311 02/09/20 1137   02/09/20 1800  doxycycline (VIBRAMYCIN) 100 mg in sodium chloride 0.9 % 250 mL IVPB  Status:  Discontinued        100 mg 125 mL/hr over 120 Minutes Intravenous Every 12 hours 02/08/20 2311 02/09/20 1137   02/09/20 1300  cefTRIAXone (ROCEPHIN) 2 g in sodium chloride 0.9 % 100 mL IVPB     Discontinue     2 g 200 mL/hr over 30 Minutes Intravenous Every 24 hours 02/09/20 1137     02/08/20 2030  cefTRIAXone (ROCEPHIN) 1 g in sodium chloride 0.9 % 100 mL IVPB        1 g 200 mL/hr over 30 Minutes Intravenous  Once 02/08/20 2027 02/08/20 2205   02/08/20 2030  azithromycin (ZITHROMAX) 500 mg in sodium chloride 0.9 % 250 mL IVPB        500 mg 250 mL/hr over 60 Minutes Intravenous  Once 02/08/20 2027 02/08/20 2346       Time spent: 25 minutes-Greater than 50% of this time was spent in counseling, explanation of diagnosis, planning of further management, and coordination of care.  MEDICATIONS: Scheduled Meds: . heparin sodium (porcine)      . amLODipine  10 mg Oral QHS  . aspirin EC  81 mg Oral QHS  . calcium acetate  667 mg Oral TID WC   And  . calcium acetate  667 mg Oral With snacks  . Chlorhexidine Gluconate Cloth  6 each Topical Q0600  . doxercalciferol  1 mcg Intravenous Q T,Th,Sa-HD  . heparin  5,000 Units Subcutaneous Q8H  . insulin aspart  0-6 Units Subcutaneous TID WC  . insulin glargine  6 Units Subcutaneous Daily  . labetalol  300 mg Oral QPM  . losartan  25 mg Oral BID  . multivitamin with minerals  1 tablet Oral q AM  . potassium chloride  20 mEq Oral Once  . potassium chloride  20 mEq Oral Once  . rosuvastatin  10 mg Oral QHS   Continuous  Infusions: . cefTRIAXone (ROCEPHIN)  IV 2 g (02/10/20 1743)   PRN Meds:.acetaminophen, loratadine, ondansetron (ZOFRAN) IV   PHYSICAL EXAM: Vital signs: Vitals:   02/11/20 0714 02/11/20 0730 02/11/20 0800 02/11/20 0830  BP: (!) 148/75 (!) 142/71 (!) 146/69 (!) 156/79  Pulse: 70 71 73 73  Resp:      Temp:      TempSrc:      SpO2:      Weight:      Height:       Filed Weights   02/09/20 0923 02/09/20 1215 02/11/20 0704  Weight: 56.5 kg 55.9 kg 57 kg   Body mass index is 22.26 kg/m.   Gen Exam:Alert awake-not in any distress HEENT:atraumatic, normocephalic Chest: B/L clear to auscultation anteriorly CVS:S1S2 regular Abdomen:soft non tender, non distended Extremities:no edema Neurology: Non focal Skin: no rash  I have personally reviewed following labs and  imaging studies  LABORATORY DATA: CBC: Recent Labs  Lab 02/08/20 1715 02/08/20 2355 02/09/20 0405 02/10/20 0907 02/11/20 0606  WBC 8.6 9.4 9.7 6.0 5.8  NEUTROABS 7.6  --   --  4.4  --   HGB 10.1* 10.1* 9.5* 10.8* 10.0*  HCT 34.0* 33.7* 31.4* 35.5* 33.3*  MCV 84.4 83.6 85.1 83.3 85.2  PLT 213 251 249 227 322    Basic Metabolic Panel: Recent Labs  Lab 02/08/20 1715 02/08/20 2355 02/09/20 0405 02/10/20 0907 02/11/20 0326  NA 136  --  136 138 136  K 3.9  --  3.6 3.4* 3.6  CL 96*  --  95* 96* 95*  CO2 27  --  22 29 28   GLUCOSE 190*  --  179* 65* 171*  BUN 16  --  23* 10 19  CREATININE 4.61* 5.08* 5.42* 3.44* 4.59*  CALCIUM 9.0  --  8.8* 9.3 9.1  PHOS  --   --   --  3.7 3.9    GFR: Estimated Creatinine Clearance: 13.3 mL/min (A) (by C-G formula based on SCr of 4.59 mg/dL (H)).  Liver Function Tests: Recent Labs  Lab 02/10/20 0907 02/11/20 0326  ALBUMIN 3.1* 2.7*   No results for input(s): LIPASE, AMYLASE in the last 168 hours. No results for input(s): AMMONIA in the last 168 hours.  Coagulation Profile: No results for input(s): INR, PROTIME in the last 168 hours.  Cardiac Enzymes: No  results for input(s): CKTOTAL, CKMB, CKMBINDEX, TROPONINI in the last 168 hours.  BNP (last 3 results) No results for input(s): PROBNP in the last 8760 hours.  Lipid Profile: No results for input(s): CHOL, HDL, LDLCALC, TRIG, CHOLHDL, LDLDIRECT in the last 72 hours.  Thyroid Function Tests: No results for input(s): TSH, T4TOTAL, FREET4, T3FREE, THYROIDAB in the last 72 hours.  Anemia Panel: No results for input(s): VITAMINB12, FOLATE, FERRITIN, TIBC, IRON, RETICCTPCT in the last 72 hours.  Urine analysis:    Component Value Date/Time   COLORURINE YELLOW 03/29/2014 1734   APPEARANCEUR CLEAR 03/29/2014 1734   LABSPEC 1.015 03/29/2014 1734   PHURINE 8.0 03/29/2014 1734   GLUCOSEU >1000 (A) 03/29/2014 1734   HGBUR SMALL (A) 03/29/2014 1734   BILIRUBINUR NEGATIVE 03/29/2014 1734   KETONESUR NEGATIVE 03/29/2014 1734   PROTEINUR 100 (A) 03/29/2014 1734   UROBILINOGEN 0.2 03/29/2014 1734   NITRITE NEGATIVE 03/29/2014 1734   LEUKOCYTESUR TRACE (A) 03/29/2014 1734    Sepsis Labs: Lactic Acid, Venous    Component Value Date/Time   LATICACIDVEN 1.5 02/09/2020 0959    MICROBIOLOGY: Recent Results (from the past 240 hour(s))  Culture, blood (routine x 2)     Status: Abnormal   Collection Time: 02/08/20  6:04 PM   Specimen: BLOOD  Result Value Ref Range Status   Specimen Description BLOOD SITE NOT SPECIFIED  Final   Special Requests   Final    BOTTLES DRAWN AEROBIC AND ANAEROBIC Blood Culture results may not be optimal due to an inadequate volume of blood received in culture bottles   Culture  Setup Time   Final    GRAM POSITIVE COCCI IN CHAINS IN BOTH AEROBIC AND ANAEROBIC BOTTLES CRITICAL VALUE NOTED.  VALUE IS CONSISTENT WITH PREVIOUSLY REPORTED AND CALLED VALUE.    Culture (A)  Final    STREPTOCOCCUS VESTIBULARIS SUSCEPTIBILITIES PERFORMED ON PREVIOUS CULTURE WITHIN THE LAST 5 DAYS. Performed at [facility], [place] [address] [place], [place] [address]    Report  Status 02/11/2020 FINAL  Final  Culture, blood (routine x 2)     Status: Abnormal   Collection Time: 02/08/20  6:15 PM   Specimen: BLOOD  Result Value Ref Range Status   Specimen Description BLOOD LEFT ANTECUBITAL  Final   Special Requests   Final    BOTTLES DRAWN AEROBIC AND ANAEROBIC Blood Culture adequate volume   Culture  Setup Time   Final    GRAM POSITIVE COCCI IN CHAINS IN BOTH AEROBIC AND ANAEROBIC BOTTLES CRITICAL RESULT CALLED TO, READ BACK BY AND VERIFIED WITH: Park Breed [id number] FCP Performed at [facility], [place] [address], [place], [place] [address]    Culture STREPTOCOCCUS VESTIBULARIS (A)  Final   Report Status 02/11/2020 FINAL  Final   Organism ID, Bacteria STREPTOCOCCUS VESTIBULARIS  Final      Susceptibility   Streptococcus vestibularis - MIC*    TETRACYCLINE 0.5 SENSITIVE Sensitive     VANCOMYCIN 0.5 SENSITIVE Sensitive     CLINDAMYCIN <=0.25 SENSITIVE Sensitive     PENICILLIN Value in next row Sensitive      SENSITIVE0.12    CEFTRIAXONE Value in next row Sensitive      SENSITIVE0.12    * STREPTOCOCCUS VESTIBULARIS  Blood Culture ID Panel (Reflexed)     Status: Abnormal   Collection Time: 02/08/20  6:15 PM  Result Value Ref Range Status   Enterococcus species NOT DETECTED NOT DETECTED Final   Listeria monocytogenes NOT DETECTED NOT DETECTED Final   Staphylococcus species NOT DETECTED NOT DETECTED Final   Staphylococcus aureus (BCID) NOT DETECTED NOT DETECTED Final   Streptococcus species DETECTED (A) NOT DETECTED Final    Comment: Not Enterococcus species, Streptococcus agalactiae, Streptococcus pyogenes, or Streptococcus pneumoniae. CRITICAL RESULT CALLED TO, READ BACK BY AND VERIFIED WITH: PHARMD JEREMY F. [id number] FCP    Streptococcus agalactiae NOT DETECTED NOT DETECTED Final   Streptococcus pneumoniae NOT DETECTED NOT DETECTED Final   Streptococcus pyogenes NOT DETECTED NOT DETECTED Final   Acinetobacter baumannii NOT DETECTED NOT  DETECTED Final   Enterobacteriaceae species NOT DETECTED NOT DETECTED Final   Enterobacter cloacae complex NOT DETECTED NOT DETECTED Final   Escherichia coli NOT DETECTED NOT DETECTED Final   Klebsiella oxytoca NOT DETECTED NOT DETECTED Final   Klebsiella pneumoniae NOT DETECTED NOT DETECTED Final   Proteus species NOT DETECTED NOT DETECTED Final   Serratia marcescens NOT DETECTED NOT DETECTED Final   Haemophilus influenzae NOT DETECTED NOT DETECTED Final   Neisseria meningitidis NOT DETECTED NOT DETECTED Final   Pseudomonas aeruginosa NOT DETECTED NOT DETECTED Final   Candida albicans NOT DETECTED NOT DETECTED Final   Candida glabrata NOT DETECTED NOT DETECTED Final   Candida krusei NOT DETECTED NOT DETECTED Final   Candida parapsilosis NOT DETECTED NOT DETECTED Final   Candida tropicalis NOT DETECTED NOT DETECTED Final    Comment: Performed at [facility], [place] [address], [place], [place] [address]  SARS Coronavirus 2 by RT PCR (hospital order, performed in [facility] hospital lab) Nasopharyngeal Nasopharyngeal Swab     Status: None   Collection Time: 02/08/20  8:48 PM   Specimen: Nasopharyngeal Swab  Result Value Ref Range Status   SARS Coronavirus 2 NEGATIVE NEGATIVE Final    Comment: (NOTE) SARS-CoV-2 target nucleic acids are NOT DETECTED.  The SARS-CoV-2 RNA is generally detectable in upper and lower respiratory specimens during the acute phase of infection. The lowest concentration of SARS-CoV-2 viral copies this assay can detect is 250 copies / mL. A negative  result does not preclude SARS-CoV-2 infection and should not be used as the sole basis for treatment or other patient management decisions.  A negative result may occur with improper specimen collection / handling, submission of specimen other than nasopharyngeal swab, presence of viral mutation(s) within the areas targeted by this assay, and inadequate number of viral copies (<250 copies / mL). A negative  result must be combined with clinical observations, patient history, and epidemiological information.  Fact Sheet for Patients:   StrictlyIdeas.no  Fact Sheet for Healthcare Providers: BankingDealers.co.za  This test is not yet approved or  cleared by the [place] FDA and has been authorized for detection and/or diagnosis of SARS-CoV-2 by FDA under an Emergency Use Authorization (EUA).  This EUA will remain in effect (meaning this test can be used) for the duration of the COVID-19 declaration under Section 564(b)(1) of the Act, 21 U.S.C. section 360bbb-3(b)(1), unless the authorization is terminated or revoked sooner.  Performed at [facility], [place] [address] [place], [place] [address]   MRSA PCR Screening     Status: None   Collection Time: 02/09/20 11:32 AM  Result Value Ref Range Status   MRSA by PCR NEGATIVE NEGATIVE Final    Comment:        The GeneXpert MRSA Assay (FDA approved for NASAL specimens only), is one component of a comprehensive MRSA colonization surveillance program. It is not intended to diagnose MRSA infection nor to guide or monitor treatment for MRSA infections. Performed at [facility], [place] [address], [place], [place] [address]   Respiratory Panel by PCR     Status: None   Collection Time: 02/09/20  5:30 PM   Specimen: Nasopharyngeal Swab; Respiratory  Result Value Ref Range Status   Adenovirus NOT DETECTED NOT DETECTED Final   Coronavirus 229E NOT DETECTED NOT DETECTED Final    Comment: (NOTE) The Coronavirus on the Respiratory Panel, DOES NOT test for the novel  Coronavirus (2019 nCoV)    Coronavirus HKU1 NOT DETECTED NOT DETECTED Final   Coronavirus NL63 NOT DETECTED NOT DETECTED Final   Coronavirus OC43 NOT DETECTED NOT DETECTED Final   Metapneumovirus NOT DETECTED NOT DETECTED Final   Rhinovirus / Enterovirus NOT DETECTED NOT DETECTED Final   Influenza A NOT DETECTED NOT  DETECTED Final   Influenza B NOT DETECTED NOT DETECTED Final   Parainfluenza Virus 1 NOT DETECTED NOT DETECTED Final   Parainfluenza Virus 2 NOT DETECTED NOT DETECTED Final   Parainfluenza Virus 3 NOT DETECTED NOT DETECTED Final   Parainfluenza Virus 4 NOT DETECTED NOT DETECTED Final   Respiratory Syncytial Virus NOT DETECTED NOT DETECTED Final   Bordetella pertussis NOT DETECTED NOT DETECTED Final   Chlamydophila pneumoniae NOT DETECTED NOT DETECTED Final   Mycoplasma pneumoniae NOT DETECTED NOT DETECTED Final    Comment: Performed at [facility], [place]. [address], [place], [place] [address]  Culture, blood (routine x 2)     Status: None (Preliminary result)   Collection Time: 02/10/20  9:02 AM   Specimen: BLOOD  Result Value Ref Range Status   Specimen Description BLOOD LEFT ANTECUBITAL  Final   Special Requests   Final    BOTTLES DRAWN AEROBIC AND ANAEROBIC Blood Culture adequate volume   Culture   Final    NO GROWTH < 24 HOURS Performed at [facility], [place] [address], [place], [place] [address]    Report Status PENDING  Incomplete  Culture, blood (routine x 2)  Status: None (Preliminary result)   Collection Time: 02/10/20  9:05 AM   Specimen: BLOOD LEFT HAND  Result Value Ref Range Status   Specimen Description BLOOD LEFT HAND  Final   Special Requests   Final    BOTTLES DRAWN AEROBIC AND ANAEROBIC Blood Culture adequate volume   Culture   Final    NO GROWTH < 24 HOURS Performed at [facility], [place] [address] [place], [place] [address]    Report Status PENDING  Incomplete    RADIOLOGY STUDIES/RESULTS: DG Chest Port 1 View  Result Date: 02/10/2020 CLINICAL DATA:  Shortness of breath. EXAM: PORTABLE CHEST 1 VIEW COMPARISON:  02/08/2020. FINDINGS: Mediastinum hilar structures normal. Progressive infiltrate in the right mid lung consistent with progression of pneumonia noted. Stable cardiomegaly. Tiny right pleural effusion cannot be excluded. No  pneumothorax. Surgical clips left chest. IMPRESSION: 1. Progressive infiltrate right mid lung consistent with progression of pneumonia. Tiny right pleural effusion cannot be excluded. 2.  Stable cardiomegaly. Electronically Signed   By: Marcello Moores  Register   On: 02/10/2020 07:02     LOS: 2 days   Oren Binet, MD  [facility]    To contact the attending provider between 7A-7P or the covering provider during after hours 7P-7A, please log into the web site [email or web address] and access using universal [place] password for that web site. If you do not have the password, please call the hospital operator.  02/11/2020, 10:01 AM

## 2020-02-11 NOTE — Consult Note (Signed)
Date of Admission:  02/08/2020          Reason for Consult: Viridans group streptococcal bacteremia with concern for endocarditis   Referring Provider: Dr.Ghimire   Assessment:  1. Streptococcus vestibularis bacteremia with concern for endocarditis 2. Diffuse rash since starting hemodialysis 3. Bilateral chronic calluses on feet 4. End-stage renal disease on hemodialysis 5. Insulin-dependent diabetes mellitus type 1 with pump 6. Hypertension 7. Hyperlipidemia 8. History of stroke 9. Diffuse skin rash that she says started after her first dialysis sessions this spring   Plan:  1. Continue ceftriaxone 2. Repeat blood cultures 3. Follow-up on 2D echocardiogram 4. Plan on obtaining transesophageal echocardiogram on Monday if possible 5. Panorex 6. May consider CT abdomen but holding for now 7. Check RPR 8. She should get COVID-19 vaccinated   Active Problems:   Cerebral infarction [facility])   Essential hypertension, benign   HLD (hyperlipidemia)   Pneumonia   ESRD on hemodialysis (HCC)   Diabetes mellitus type I ([place])   Hypoglycemia   Scheduled Meds: . amLODipine  10 mg Oral QHS  . aspirin EC  81 mg Oral QHS  . calcium acetate  667 mg Oral TID WC   And  . calcium acetate  667 mg Oral With snacks  . Chlorhexidine Gluconate Cloth  6 each Topical Q0600  . doxercalciferol  1 mcg Intravenous Q T,Th,Sa-HD  . heparin  5,000 Units Subcutaneous Q8H  . insulin aspart  0-6 Units Subcutaneous TID WC  . insulin glargine  6 Units Subcutaneous Daily  . labetalol  300 mg Oral QPM  . losartan  25 mg Oral BID  . multivitamin with minerals  1 tablet Oral q AM  . potassium chloride  20 mEq Oral Once  . potassium chloride  20 mEq Oral Once  . rosuvastatin  10 mg Oral QHS   Continuous Infusions: . cefTRIAXone (ROCEPHIN)  IV 2 g (02/10/20 1743)   PRN Meds:.acetaminophen, loratadine, ondansetron (ZOFRAN) IV  HPI: [name] is a 44 y.o. female with type 1  insulin-dependent diabetes mellitus who also has comorbid hypertension and hyperlipidemia who was developed end-stage renal disease and is on hemodialysis via a graft in the right arm.  She tells me that she started dialysis this spring and at that time had a catheter.  She then developed a rash that she still has visible on her today.  She was brought from home via EMS after being found unresponsive.  She tells me that she was having low blood sugars in the 20s at times.  She also says that she became very very cold.  In the ER she was also hypothermic confused and with low blood sugar.  She showed a right midlung opacity blood cultures were obtained and she was started on ceftriaxone and azithromycin.  In the interim her hemodynamics have improved with volume resuscitation.  Her blood cultures have grown Streptococcus vestibularis which is a viridans group streptococcal species and 2 out of 2 cultures.  I am concerned that she could have native valve endocarditis.  Looking at her mouth I do not see any clear-cut abnormal dentition as the source but it is not we should check a Panorex.  Certainly intra-abdominal source could be possible as well.  She does not have any overt diabetic foot infections.  Her AV graft appears to be working well.       Review of Systems: Review of Systems  Constitutional: Positive for chills, diaphoresis, fever and malaise/fatigue.  Negative for weight loss.  HENT: Negative for congestion, hearing loss, sore throat and tinnitus.   Eyes: Negative for blurred vision and double vision.  Respiratory: Negative for cough, sputum production, shortness of breath and wheezing.   Cardiovascular: Negative for chest pain, palpitations and leg swelling.  Gastrointestinal: Positive for abdominal pain and nausea. Negative for blood in stool, constipation, diarrhea, heartburn, melena and vomiting.  Genitourinary: Negative for dysuria, flank pain and hematuria.  Musculoskeletal:  Negative for back pain, falls, joint pain and myalgias.  Skin: Negative for itching and rash.  Neurological: Negative for dizziness, sensory change, focal weakness, loss of consciousness, weakness and headaches.  Endo/Heme/Allergies: Does not bruise/bleed easily.  Psychiatric/Behavioral: Negative for depression, memory loss and suicidal ideas. The patient is not nervous/anxious.     Past Medical History:  Diagnosis Date  . Anemia   . Arthritis   . Diabetes mellitus   . Diabetic retinopathy ([place])    Hx of laser Rx's  . DM type 1 (diabetes mellitus, type 1) ([place]) 09/12/2014   Age of onset for DM type 1 was age 65.     . Enlarged thyroid gland   . ESRD on hemodialysis ([place])    ESRD due to DM type I, age of onset DM 1 was age 89.  Went on dialysis in March 2012.  Gets HD now at [facility] on a TTS schedule.    Marland Kitchen ESRD on hemodialysis ([place]) 09/12/2014   ESRD due to DM type 1.  Started HD in 2012 at [facility].  Gets HD there on a TTS schedule.    Marland Kitchen GERD (gastroesophageal reflux disease)   . History of blood transfusion   . Hyperlipidemia   . Hypertension   . Peripheral vascular disease ([place])   . Pneumonia     Social History   Tobacco Use  . Smoking status: Never Smoker  . Smokeless tobacco: Never Used  Vaping Use  . Vaping Use: Never used  Substance Use Topics  . Alcohol use: No    Alcohol/week: 0.0 standard drinks  . Drug use: No    Family History  Problem Relation Age of Onset  . Cancer Mother        breast  . Hypertension Father    Allergies  Allergen Reactions  . Pollen Extract Other (See Comments)    Watery eyes    OBJECTIVE: Blood pressure (!) 160/71, pulse 74, temperature 98.2 F (36.8 C), temperature source Oral, resp. rate 18, height 5\' 3"  (1.6 m), weight 55 kg, SpO2 90 %.  Physical Exam Constitutional:      General: She is not in acute distress.    Appearance: Normal appearance. She is well-developed. She is not ill-appearing or diaphoretic.  HENT:      Head: Normocephalic and atraumatic.     Right Ear: Hearing and external ear normal.     Left Ear: Hearing and external ear normal.     Nose: No nasal deformity or rhinorrhea.     Mouth/Throat:     Mouth: Mucous membranes are moist.     Dentition: Normal dentition. No dental tenderness.     Tongue: No lesions.     Pharynx: Oropharynx is clear. Uvula midline.  Eyes:     General: No scleral icterus.    Conjunctiva/sclera: Conjunctivae normal.     Right eye: Right conjunctiva is not injected.     Left eye: Left conjunctiva is not injected.     Pupils: Pupils are equal, round, and reactive  to light.  Neck:     Vascular: No JVD.  Cardiovascular:     Rate and Rhythm: Normal rate and regular rhythm.     Heart sounds: Normal heart sounds, S1 normal and S2 normal. No murmur heard.  No friction rub.  Pulmonary:     Effort: Pulmonary effort is normal. No respiratory distress.     Breath sounds: Normal breath sounds. No stridor. No wheezing or rhonchi.  Abdominal:     General: Bowel sounds are normal. There is no distension.     Palpations: Abdomen is soft. There is no mass.     Tenderness: There is no abdominal tenderness.     Hernia: No hernia is present.  Musculoskeletal:        General: Normal range of motion.     Right shoulder: Normal.     Left shoulder: Normal.     Cervical back: Normal range of motion and neck supple.     Right hip: Normal.     Left hip: Normal.     Right knee: Normal.     Left knee: Normal.  Lymphadenopathy:     Head:     Right side of head: No submandibular, preauricular or posterior auricular adenopathy.     Left side of head: No submandibular, preauricular or posterior auricular adenopathy.     Cervical: No cervical adenopathy.     Right cervical: No superficial or deep cervical adenopathy.    Left cervical: No superficial or deep cervical adenopathy.  Skin:    General: Skin is warm and dry.     Coloration: Skin is not pale.     Findings: No abrasion,  bruising, ecchymosis, erythema, lesion or rash.     Nails: There is no clubbing.  Neurological:     Mental Status: She is alert and oriented to person, place, and time.     Sensory: No sensory deficit.     Coordination: Coordination normal.     Gait: Gait normal.  Psychiatric:        Attention and Perception: She is attentive.        Mood and Affect: Mood normal.        Speech: Speech normal.        Behavior: Behavior normal. Behavior is cooperative.        Thought Content: Thought content normal.        Judgment: Judgment normal.    Rash on chest and abdomen  No Janeway's or Osler's nodes  She has bilateral calluses on her feet that are chronic and do not appear infected Lab Results Lab Results  Component Value Date   WBC 5.8 02/11/2020   HGB 10.0 (L) 02/11/2020   HCT 33.3 (L) 02/11/2020   MCV 85.2 02/11/2020   PLT 196 02/11/2020    Lab Results  Component Value Date   CREATININE 4.59 (H) 02/11/2020   BUN 19 02/11/2020   NA 136 02/11/2020   K 3.6 02/11/2020   CL 95 (L) 02/11/2020   CO2 28 02/11/2020    Lab Results  Component Value Date   ALT 30 10/21/2019   AST 42 (H) 10/21/2019   ALKPHOS 446 (H) 10/21/2019   BILITOT 1.7 (H) 10/21/2019     Microbiology: Recent Results (from the past 240 hour(s))  Culture, blood (routine x 2)     Status: Abnormal   Collection Time: 02/08/20  6:04 PM   Specimen: BLOOD  Result Value Ref Range Status   Specimen Description BLOOD SITE NOT  SPECIFIED  Final   Special Requests   Final    BOTTLES DRAWN AEROBIC AND ANAEROBIC Blood Culture results may not be optimal due to an inadequate volume of blood received in culture bottles   Culture  Setup Time   Final    GRAM POSITIVE COCCI IN CHAINS IN BOTH AEROBIC AND ANAEROBIC BOTTLES CRITICAL VALUE NOTED.  VALUE IS CONSISTENT WITH PREVIOUSLY REPORTED AND CALLED VALUE.    Culture (A)  Final    STREPTOCOCCUS VESTIBULARIS SUSCEPTIBILITIES PERFORMED ON PREVIOUS CULTURE WITHIN THE LAST 5  DAYS. Performed at [facility], [place] [address] [place], [place] [address]    Report Status 02/11/2020 FINAL  Final  Culture, blood (routine x 2)     Status: Abnormal   Collection Time: 02/08/20  6:15 PM   Specimen: BLOOD  Result Value Ref Range Status   Specimen Description BLOOD LEFT ANTECUBITAL  Final   Special Requests   Final    BOTTLES DRAWN AEROBIC AND ANAEROBIC Blood Culture adequate volume   Culture  Setup Time   Final    GRAM POSITIVE COCCI IN CHAINS IN BOTH AEROBIC AND ANAEROBIC BOTTLES CRITICAL RESULT CALLED TO, READ BACK BY AND VERIFIED WITH: Park Breed [id number] FCP Performed at [facility], [place] [address] [place], [place] [address]    Culture STREPTOCOCCUS VESTIBULARIS (A)  Final   Report Status 02/11/2020 FINAL  Final   Organism ID, Bacteria STREPTOCOCCUS VESTIBULARIS  Final      Susceptibility   Streptococcus vestibularis - MIC*    TETRACYCLINE 0.5 SENSITIVE Sensitive     VANCOMYCIN 0.5 SENSITIVE Sensitive     CLINDAMYCIN <=0.25 SENSITIVE Sensitive     PENICILLIN Value in next row Sensitive      SENSITIVE0.12    CEFTRIAXONE Value in next row Sensitive      SENSITIVE0.12    * STREPTOCOCCUS VESTIBULARIS  Blood Culture ID Panel (Reflexed)     Status: Abnormal   Collection Time: 02/08/20  6:15 PM  Result Value Ref Range Status   Enterococcus species NOT DETECTED NOT DETECTED Final   Listeria monocytogenes NOT DETECTED NOT DETECTED Final   Staphylococcus species NOT DETECTED NOT DETECTED Final   Staphylococcus aureus (BCID) NOT DETECTED NOT DETECTED Final   Streptococcus species DETECTED (A) NOT DETECTED Final    Comment: Not Enterococcus species, Streptococcus agalactiae, Streptococcus pyogenes, or Streptococcus pneumoniae. CRITICAL RESULT CALLED TO, READ BACK BY AND VERIFIED WITH: PHARMD JEREMY F. [id number] FCP    Streptococcus agalactiae NOT DETECTED NOT DETECTED Final   Streptococcus pneumoniae NOT DETECTED NOT DETECTED Final    Streptococcus pyogenes NOT DETECTED NOT DETECTED Final   Acinetobacter baumannii NOT DETECTED NOT DETECTED Final   Enterobacteriaceae species NOT DETECTED NOT DETECTED Final   Enterobacter cloacae complex NOT DETECTED NOT DETECTED Final   Escherichia coli NOT DETECTED NOT DETECTED Final   Klebsiella oxytoca NOT DETECTED NOT DETECTED Final   Klebsiella pneumoniae NOT DETECTED NOT DETECTED Final   Proteus species NOT DETECTED NOT DETECTED Final   Serratia marcescens NOT DETECTED NOT DETECTED Final   Haemophilus influenzae NOT DETECTED NOT DETECTED Final   Neisseria meningitidis NOT DETECTED NOT DETECTED Final   Pseudomonas aeruginosa NOT DETECTED NOT DETECTED Final   Candida albicans NOT DETECTED NOT DETECTED Final   Candida glabrata NOT DETECTED NOT DETECTED Final   Candida krusei NOT DETECTED NOT DETECTED Final   Candida parapsilosis NOT DETECTED NOT DETECTED Final   Candida tropicalis NOT DETECTED NOT DETECTED  Final    Comment: Performed at [facility], [place] [address] [place], [place] [address]  SARS Coronavirus 2 by RT PCR (hospital order, performed in [facility] hospital lab) Nasopharyngeal Nasopharyngeal Swab     Status: None   Collection Time: 02/08/20  8:48 PM   Specimen: Nasopharyngeal Swab  Result Value Ref Range Status   SARS Coronavirus 2 NEGATIVE NEGATIVE Final    Comment: (NOTE) SARS-CoV-2 target nucleic acids are NOT DETECTED.  The SARS-CoV-2 RNA is generally detectable in upper and lower respiratory specimens during the acute phase of infection. The lowest concentration of SARS-CoV-2 viral copies this assay can detect is 250 copies / mL. A negative result does not preclude SARS-CoV-2 infection and should not be used as the sole basis for treatment or other patient management decisions.  A negative result may occur with improper specimen collection / handling, submission of specimen other than nasopharyngeal swab, presence of viral mutation(s) within the areas  targeted by this assay, and inadequate number of viral copies (<250 copies / mL). A negative result must be combined with clinical observations, patient history, and epidemiological information.  Fact Sheet for Patients:   StrictlyIdeas.no  Fact Sheet for Healthcare Providers: BankingDealers.co.za  This test is not yet approved or  cleared by the [place] FDA and has been authorized for detection and/or diagnosis of SARS-CoV-2 by FDA under an Emergency Use Authorization (EUA).  This EUA will remain in effect (meaning this test can be used) for the duration of the COVID-19 declaration under Section 564(b)(1) of the Act, 21 U.S.C. section 360bbb-3(b)(1), unless the authorization is terminated or revoked sooner.  Performed at [facility], [place] [address] [place], [place] [address]   MRSA PCR Screening     Status: None   Collection Time: 02/09/20 11:32 AM  Result Value Ref Range Status   MRSA by PCR NEGATIVE NEGATIVE Final    Comment:        The GeneXpert MRSA Assay (FDA approved for NASAL specimens only), is one component of a comprehensive MRSA colonization surveillance program. It is not intended to diagnose MRSA infection nor to guide or monitor treatment for MRSA infections. Performed at [facility], [place] [address] [place], [place] [address]   Respiratory Panel by PCR     Status: None   Collection Time: 02/09/20  5:30 PM   Specimen: Nasopharyngeal Swab; Respiratory  Result Value Ref Range Status   Adenovirus NOT DETECTED NOT DETECTED Final   Coronavirus 229E NOT DETECTED NOT DETECTED Final    Comment: (NOTE) The Coronavirus on the Respiratory Panel, DOES NOT test for the novel  Coronavirus (2019 nCoV)    Coronavirus HKU1 NOT DETECTED NOT DETECTED Final   Coronavirus NL63 NOT DETECTED NOT DETECTED Final   Coronavirus OC43 NOT DETECTED NOT DETECTED Final   Metapneumovirus NOT DETECTED NOT DETECTED Final    Rhinovirus / Enterovirus NOT DETECTED NOT DETECTED Final   Influenza A NOT DETECTED NOT DETECTED Final   Influenza B NOT DETECTED NOT DETECTED Final   Parainfluenza Virus 1 NOT DETECTED NOT DETECTED Final   Parainfluenza Virus 2 NOT DETECTED NOT DETECTED Final   Parainfluenza Virus 3 NOT DETECTED NOT DETECTED Final   Parainfluenza Virus 4 NOT DETECTED NOT DETECTED Final   Respiratory Syncytial Virus NOT DETECTED NOT DETECTED Final   Bordetella pertussis NOT DETECTED NOT DETECTED Final   Chlamydophila pneumoniae NOT DETECTED NOT DETECTED Final   Mycoplasma pneumoniae NOT DETECTED NOT DETECTED Final  Comment: Performed at [facility], [place] [address], [place], [place] [address]  Culture, blood (routine x 2)     Status: None (Preliminary result)   Collection Time: 02/10/20  9:02 AM   Specimen: BLOOD  Result Value Ref Range Status   Specimen Description BLOOD LEFT ANTECUBITAL  Final   Special Requests   Final    BOTTLES DRAWN AEROBIC AND ANAEROBIC Blood Culture adequate volume   Culture   Final    NO GROWTH < 24 HOURS Performed at [facility], [place] [address] [place],  [address]    Report Status PENDING  Incomplete  Culture, blood (routine x 2)     Status: None (Preliminary result)   Collection Time: 02/10/20  9:05 AM   Specimen: BLOOD LEFT HAND  Result Value Ref Range Status   Specimen Description BLOOD LEFT HAND  Final   Special Requests   Final    BOTTLES DRAWN AEROBIC AND ANAEROBIC Blood Culture adequate volume   Culture   Final    NO GROWTH < 24 HOURS Performed at [facility], [place] [address] [place],  [address]    Report Status PENDING  Incomplete    Alcide Evener, [place] [facility] [phone] pager  02/11/2020, 11:36 AM

## 2020-02-11 NOTE — Progress Notes (Signed)
[facility] Progress Note   Subjective:   Seen on HD today - no issues with treatment.  Staying due to difficulties with low BG.   Objective Vitals:   02/10/20 1643 02/10/20 2151 02/11/20 0714 02/11/20 0730  BP: (!) 148/72 (!) 144/71 (!) 148/75 (!) 142/71  Pulse: 77 73 70 71  Resp: 17 16    Temp: (!) 97.5 F (36.4 C) 98.3 F (36.8 C)    TempSrc: Oral Oral    SpO2: 91% 90%    Weight:      Height:       Physical Exam General: well appearing Heart: RRR Lungs: normal WOB on RA Extremities: no edema Dialysis Access:  RUE AVG accessed Qb 400  Additional Objective Labs: Basic Metabolic Panel: Recent Labs  Lab 02/09/20 0405 02/10/20 0907 02/11/20 0326  NA 136 138 136  K 3.6 3.4* 3.6  CL 95* 96* 95*  CO2 22 29 28   GLUCOSE 179* 65* 171*  BUN 23* 10 19  CREATININE 5.42* 3.44* 4.59*  CALCIUM 8.8* 9.3 9.1  PHOS  --  3.7 3.9   Liver Function Tests: Recent Labs  Lab 02/10/20 0907 02/11/20 0326  ALBUMIN 3.1* 2.7*   No results for input(s): LIPASE, AMYLASE in the last 168 hours. CBC: Recent Labs  Lab 02/08/20 1715 02/08/20 1715 02/08/20 2355 02/08/20 2355 02/09/20 0405 02/10/20 0907 02/11/20 0606  WBC 8.6   < > 9.4   < > 9.7 6.0 5.8  NEUTROABS 7.6  --   --   --   --  4.4  --   HGB 10.1*   < > 10.1*   < > 9.5* 10.8* 10.0*  HCT 34.0*   < > 33.7*   < > 31.4* 35.5* 33.3*  MCV 84.4  --  83.6  --  85.1 83.3 85.2  PLT 213   < > 251   < > 249 227 196   < > = values in this interval not displayed.   Blood Culture    Component Value Date/Time   SDES BLOOD LEFT HAND 02/10/2020 0905   SPECREQUEST  02/10/2020 0905    BOTTLES DRAWN AEROBIC AND ANAEROBIC Blood Culture adequate volume   CULT  02/10/2020 0905    NO GROWTH < 24 HOURS Performed at [facility], [place] [address] [place], [place] [address]    REPTSTATUS PENDING 02/10/2020 1941    Cardiac Enzymes: No results for input(s): CKTOTAL, CKMB, CKMBINDEX, TROPONINI in the last 168  hours. CBG: Recent Labs  Lab 02/10/20 1729 02/10/20 1953 02/10/20 2248 02/10/20 2353 02/11/20 0425  GLUCAP 221* 222* 286* 214* 170*   Iron Studies: No results for input(s): IRON, TIBC, TRANSFERRIN, FERRITIN in the last 72 hours. @lablastinr3 @ Studies/Results: DG Chest Port 1 View  Result Date: 02/10/2020 CLINICAL DATA:  Shortness of breath. EXAM: PORTABLE CHEST 1 VIEW COMPARISON:  02/08/2020. FINDINGS: Mediastinum hilar structures normal. Progressive infiltrate in the right mid lung consistent with progression of pneumonia noted. Stable cardiomegaly. Tiny right pleural effusion cannot be excluded. No pneumothorax. Surgical clips left chest. IMPRESSION: 1. Progressive infiltrate right mid lung consistent with progression of pneumonia. Tiny right pleural effusion cannot be excluded. 2.  Stable cardiomegaly. Electronically Signed   By: Marcello Moores  Register   On: 02/10/2020 07:02   Medications: . cefTRIAXone (ROCEPHIN)  IV 2 g (02/10/20 1743)   . heparin sodium (porcine)      . amLODipine  10 mg Oral QHS  . aspirin EC  81 mg  Oral QHS  . calcium acetate  667 mg Oral TID WC   And  . calcium acetate  667 mg Oral With snacks  . Chlorhexidine Gluconate Cloth  6 each Topical Q0600  . doxercalciferol  1 mcg Intravenous Q T,Th,Sa-HD  . heparin  5,000 Units Subcutaneous Q8H  . insulin aspart  0-6 Units Subcutaneous TID WC  . insulin glargine  6 Units Subcutaneous Daily  . labetalol  300 mg Oral QPM  . losartan  25 mg Oral BID  . multivitamin with minerals  1 tablet Oral q AM  . potassium chloride  20 mEq Oral Once  . potassium chloride  20 mEq Oral Once  . rosuvastatin  10 mg Oral QHS    Dialysis orders:  [facility] TTS 1st shift, 3.5hrs, RUE AVF, BFR 425/DFR 800 2K/2.25Ca, EDW 55.5kg hectorol 1qtx Heparin 2000u start of HD mircera 100q2wks, last dose 7/13, venofer 100 IV x 5 doses, has rec'd 2. 7/15 Hb 10.3 7/8 phos 4.2 12/2019 PTH 348 7/20 tx post wt 56.5kg, usually gets to  EDW   Assessment/Plan **RLL PNA:  Being treated for CAP, COVID neg (unvaccinated).  Seems stable, per primary.   **Bacteremia: culture with strep vestibularis.  Pan sens - would think vanc vs cefazolin at dialysis would be appropriate and they have both in stock.  Defer to primary --> please just message me with preference and I'll send on to HD.  She's getting CTX here.    **ESRD on HD:  TTS on schedule today.  EDW ~ unchanged here.   **Anemia of ESRD:  Hb in 9-10s, hold IV iron with infection.  On ESA outpt.   **BMM:  Phos, corr ca, PTH ok outpt.  Cont hectorol 1 TIW here.  Cont ca acetate.   **DM type 1, complicated by hypoglycemia:  Likely due to poor po intake/infection. Insulin per primary  **HTN:  Normotensive, on home meds; hold prior to HD  OK to d/c from my perspective.  She tells me she's staying due to difficulties with BG>   Jannifer Hick MD 02/11/2020, 8:22 AM  [facility] Pager: [phone]

## 2020-02-11 NOTE — Progress Notes (Signed)
*  PRELIMINARY RESULTS* Echocardiogram 2D Echocardiogram has been performed.  Leavy Cella 02/11/2020, 3:15 PM

## 2020-02-12 DIAGNOSIS — T68XXXA Hypothermia, initial encounter: Secondary | ICD-10-CM

## 2020-02-12 DIAGNOSIS — E1022 Type 1 diabetes mellitus with diabetic chronic kidney disease: Secondary | ICD-10-CM | POA: Diagnosis not present

## 2020-02-12 DIAGNOSIS — J189 Pneumonia, unspecified organism: Secondary | ICD-10-CM | POA: Diagnosis not present

## 2020-02-12 DIAGNOSIS — R7881 Bacteremia: Secondary | ICD-10-CM | POA: Diagnosis not present

## 2020-02-12 LAB — GLUCOSE, CAPILLARY
Glucose-Capillary: 426 mg/dL — ABNORMAL HIGH (ref 70–99)
Glucose-Capillary: 284 mg/dL — ABNORMAL HIGH (ref 70–99)
Glucose-Capillary: 83 mg/dL (ref 70–99)
Glucose-Capillary: 59 mg/dL — ABNORMAL LOW (ref 70–99)
Glucose-Capillary: 93 mg/dL (ref 70–99)
Glucose-Capillary: 105 mg/dL — ABNORMAL HIGH (ref 70–99)

## 2020-02-12 MED ORDER — INSULIN ASPART 100 UNIT/ML ~~LOC~~ SOLN
10.0000 [IU] | Freq: Once | SUBCUTANEOUS | Status: AC
  Administered 2020-02-12: 10 [IU] via SUBCUTANEOUS

## 2020-02-12 MED ORDER — DARBEPOETIN ALFA 100 MCG/0.5ML IJ SOSY
100.0000 ug | PREFILLED_SYRINGE | INTRAMUSCULAR | Status: DC
  Filled 2020-02-12: qty 0.5

## 2020-02-12 MED ORDER — INSULIN ASPART 100 UNIT/ML ~~LOC~~ SOLN
2.0000 [IU] | Freq: Three times a day (TID) | SUBCUTANEOUS | Status: DC
  Administered 2020-02-13: 2 [IU] via SUBCUTANEOUS

## 2020-02-12 MED ORDER — INSULIN ASPART 100 UNIT/ML ~~LOC~~ SOLN: 0.0000 [IU] | SUBCUTANEOUS | Status: DC

## 2020-02-12 MED ORDER — INSULIN ASPART 100 UNIT/ML ~~LOC~~ SOLN
0.0000 [IU] | Freq: Three times a day (TID) | SUBCUTANEOUS | Status: DC
  Administered 2020-02-13: 3 [IU] via SUBCUTANEOUS
  Administered 2020-02-14: 2 [IU] via SUBCUTANEOUS

## 2020-02-12 NOTE — Progress Notes (Signed)
Subjective: No complaints, hemodialysis yesterday to to her EDW, 1592 mL UF, be P stable  Objective Vital signs in last 24 hours: Vitals:   02/11/20 1624 02/11/20 2128 02/12/20 0500 02/12/20 0755  BP: (!) 151/75 (!) 146/72  (!) 167/74  Pulse: 75 72  80  Resp: 19 18  19   Temp: 98.2 F (36.8 C) 98.2 F (36.8 C)  98.3 F (36.8 C)  TempSrc:  Oral    SpO2: 100% 91%  98%  Weight:   57.7 kg   Height:       Weight change:   Physical Exam: General: Thin well-appearing female NAD Heart: RRR, no rub, no murmur Lungs: CTA nonlabored breathing Abdomen: Bowel sounds normoactive, soft nontender nondistended Extremities: No pedal edema Dialysis Access: Right upper arm AV Gore-Tex graft positive bruit thrill no discharge or erythema  Dialysis orders: [facility] TTS 1st shift, 3.5hrs, RUE AVF, BFR 425/DFR 800 2K/2.25Ca, EDW 55.5kg hectorol 1qtx 12/2019 PTH 348 Heparin 2000u start of HD mircera 100q2wks, last dose 7/13, venofer 100 IV x 5 doses, has rec'd 2. 7/15 Hb 10.3 7/8 phos 4.2  usually gets to EDW   Problem/Plan: **RLL PNA: Being treated for CAP, COVID neg (unvaccinated). Seems stable, per primary.   **Bacteremia: culture with strep vestibularis.  Pan sens -infectious disease now consulted with concern for endocarditis = continue ceftriaxone, repeat blood cultures, 2D echocardiogram today, possible TEE on Monday if possible, noted other plans =Panorex possible CT abdomen check RPR   **ESRD on HD: TTS on schedule to EDW yesterday   EDW ~ unchanged here.   **Anemia of ESRD: Hb 10.0hold IV iron with infection. On ESA outpt next dose will be Tuesday 7/27 Aranesp 100 MCG  **BMM: Phos, corr ca, PTH ok outpt. Cont hectorol 1 TIW here. Cont ca acetate.   **DM type 1, complicated by hypoglycemia: Likely due to poor po intake/infection. Insulin per primary  **HTN: Normotensive, on home meds; hold prior to HD  Ernest Haber, PA-C [facility]  [phone] 02/12/2020,11:24 AM  LOS: 3 days   Labs: Basic Metabolic Panel: Recent Labs  Lab 02/09/20 0405 02/10/20 0907 02/11/20 0326  NA 136 138 136  K 3.6 3.4* 3.6  CL 95* 96* 95*  CO2 22 29 28   GLUCOSE 179* 65* 171*  BUN 23* 10 19  CREATININE 5.42* 3.44* 4.59*  CALCIUM 8.8* 9.3 9.1  PHOS  --  3.7 3.9   Liver Function Tests: Recent Labs  Lab 02/10/20 0907 02/11/20 0326  ALBUMIN 3.1* 2.7*   No results for input(s): LIPASE, AMYLASE in the last 168 hours. No results for input(s): AMMONIA in the last 168 hours. CBC: Recent Labs  Lab 02/08/20 1715 02/08/20 1715 02/08/20 2355 02/08/20 2355 02/09/20 0405 02/10/20 0907 02/11/20 0606  WBC 8.6   < > 9.4   < > 9.7 6.0 5.8  NEUTROABS 7.6  --   --   --   --  4.4  --   HGB 10.1*   < > 10.1*   < > 9.5* 10.8* 10.0*  HCT 34.0*   < > 33.7*   < > 31.4* 35.5* 33.3*  MCV 84.4  --  83.6  --  85.1 83.3 85.2  PLT 213   < > 251   < > 249 227 196   < > = values in this interval not displayed.   Cardiac Enzymes: No results for input(s): CKTOTAL, CKMB, CKMBINDEX, TROPONINI in the last 168 hours. CBG: Recent Labs  Lab  02/11/20 1620 02/11/20 1944 02/11/20 2351 02/12/20 0353 02/12/20 0752  GLUCAP 185* 202* 373* 426* 284*     Medications: . cefTRIAXone (ROCEPHIN)  IV 2 g (02/11/20 1245)   . amLODipine  10 mg Oral QHS  . aspirin EC  81 mg Oral QHS  . calcium acetate  667 mg Oral TID WC   And  . calcium acetate  667 mg Oral With snacks  . Chlorhexidine Gluconate Cloth  6 each Topical Q0600  . doxercalciferol  1 mcg Intravenous Q T,Th,Sa-HD  . heparin  5,000 Units Subcutaneous Q8H  . insulin aspart  0-6 Units Subcutaneous TID WC  . insulin aspart  2 Units Subcutaneous TID WC  . insulin glargine  6 Units Subcutaneous Daily  . labetalol  300 mg Oral QPM  . losartan  25 mg Oral BID  . multivitamin with minerals  1 tablet Oral q AM  . potassium chloride  20 mEq Oral Once  . potassium chloride  20 mEq Oral Once  . rosuvastatin   10 mg Oral QHS

## 2020-02-12 NOTE — Progress Notes (Signed)
PROGRESS NOTE        PATIENT DETAILS Name: RANIKA MCNIEL Age: 44 y.o. Sex: female Date of Birth: [birth year] Admit Date: 02/08/2020 Admitting Physician Antonieta Pert, MD TWS:[id number], Iona Beard, MD  Brief Narrative: Patient is a 44 y.o. female with history of DM-1 on insulin pump, ESRD on HD TTS, HTN-who presented with altered mental status secondary to hypoglycemia. Upon further evaluation in the emergency room-chest x-ray was consistent with pneumonia-subsequent blood cultures positive  streptococcal vestibularis. See Below for further details.  Note-not vaccinated against Covid-COVID-19 PCR negative on admission  Significant events: 7/21>> admit for unresponsiveness-hypoglycemia/hypothermia. Chest x-ray positive for pneumonia, blood cultures positive for streptococcal vestibularis bacteremia.  Significant studies: 7/24>>Echo: EF 00-17%, grade 2 diastolic dysfunction 4/94>> chest x-ray:. Progressive infiltrate right mid lung consistent with progression of pneumonia. 7/21>> chest x-ray:  airspace disease right midlung-consistent with bronchopneumonia  Antimicrobial therapy: Rocephin: 7/21>> Zithromax: 7/21 x 1  Microbiology data: 7/23>> blood culture: neg so far 7/21>> blood culture: Strep Vestibularis 7/21>>SARS Coronavirus 2: Negative  Procedures : None  Consults: Nephrology ID  DVT Prophylaxis : heparin injection 5,000 Units Start: 02/08/20 2315   Subjective: No major issues overnight-lying comfortably in bed-no vomiting.  Assessment/Plan: Acute metabolic encephalopathy secondary to hypoglycemia: Resolved-she is completely awake and alert.   Hypoglycemia-history of DM-1 on insulin pump (A1c 6.2 on 02/08/2020): Claims she woke up in an ambulance on the day of admission-and was told she was hypoglycemic.  She is maintained on an insulin pump-Per patient-she ran out of her monitoring device a few days back that notifies her of episodes of  hypoglycemia.  Given streptococcal bacteremia-suspect she had poor oral intake that likely contributed to hypoglycemic episode on presentation.  Appears to have very brittle diabetes-with both hypoglycemic and hyperglycemic episodes on the same day.  CBGs now on the higher side-unfortunately was not given Lantus yesterday-have asked nurse to give Lantus this morning-add 2 units of NovoLog with meals-continue SSI-once she has completed all her procedures/TEE-we will plan to switch her back to her insulin pump-and see how she does.  Streptococcal vestibularis bacteremia: Initial blood cultures on admission positive for Streptococcus vestibularis-repeat blood culture on 7/23 NEG so far.  HD site without any obvious infection.  Chest x-ray confirms PNA as well-not sure if this is the foci for infection..  Remains on Rocephin-transthoracic echocardiogram negative for vegetations-have reached out to cardiology today-to see if we can arrange for TEE tomorrow.  Appreciate ID input.  Community-acquired pneumonia: Acknowledges cough/URI symptoms for a week or so prior to this hospital stay-chest x-ray confirms PNA (repeated after HD)-on Rocephin.  Clinically improved.  No fever or shortness of breath.  Vomiting: Resolved.  Likely secondary to bacteremia-abdominal exam appears benign  ESRD on HD TTS: Nephrology consulted and directing care.  HTN: BP elevated today-on HD-should improve post HD-continue labetalol, amlodipine and losartan-follow and adjust accordingly.  Anemia likely secondary to chronic disease: Hemoglobin stable-defer IV iron/Aranesp to nephrology.  Diet: Diet Order            Diet NPO time specified  Diet effective midnight           Diet renal with fluid restriction Fluid restriction: 1200 mL Fluid; Room service appropriate? Yes; Fluid consistency: Thin  Diet effective now                  Code Status:  Full code   Family Communication: Aunt over the phone on 7/24  Disposition  Plan: Status is: Inpatient   Inpatient level of care appropriate due to severity of illness  Dispo: The patient is from: Home              Anticipated d/c is to: Home              Anticipated d/c date is: > 3 days              Patient currently is not medically stable to d/c.   Barriers to Discharge: Streptococcal bacteremia requiring IV antibiotics-requires TEE-see above.  Antimicrobial agents: Anti-infectives (From admission, onward)   Start     Dose/Rate Route Frequency Ordered Stop   02/09/20 1800  cefTRIAXone (ROCEPHIN) 1 g in sodium chloride 0.9 % 100 mL IVPB  Status:  Discontinued        1 g 200 mL/hr over 30 Minutes Intravenous Every 24 hours 02/08/20 2311 02/09/20 1137   02/09/20 1800  doxycycline (VIBRAMYCIN) 100 mg in sodium chloride 0.9 % 250 mL IVPB  Status:  Discontinued        100 mg 125 mL/hr over 120 Minutes Intravenous Every 12 hours 02/08/20 2311 02/09/20 1137   02/09/20 1300  cefTRIAXone (ROCEPHIN) 2 g in sodium chloride 0.9 % 100 mL IVPB     Discontinue     2 g 200 mL/hr over 30 Minutes Intravenous Every 24 hours 02/09/20 1137     02/08/20 2030  cefTRIAXone (ROCEPHIN) 1 g in sodium chloride 0.9 % 100 mL IVPB        1 g 200 mL/hr over 30 Minutes Intravenous  Once 02/08/20 2027 02/08/20 2205   02/08/20 2030  azithromycin (ZITHROMAX) 500 mg in sodium chloride 0.9 % 250 mL IVPB        500 mg 250 mL/hr over 60 Minutes Intravenous  Once 02/08/20 2027 02/08/20 2346       Time spent: 25 minutes-Greater than 50% of this time was spent in counseling, explanation of diagnosis, planning of further management, and coordination of care.  MEDICATIONS: Scheduled Meds: . amLODipine  10 mg Oral QHS  . aspirin EC  81 mg Oral QHS  . calcium acetate  667 mg Oral TID WC   And  . calcium acetate  667 mg Oral With snacks  . Chlorhexidine Gluconate Cloth  6 each Topical Q0600  . doxercalciferol  1 mcg Intravenous Q T,Th,Sa-HD  . heparin  5,000 Units Subcutaneous Q8H  .  insulin aspart  0-6 Units Subcutaneous TID WC  . insulin aspart  2 Units Subcutaneous TID WC  . insulin glargine  6 Units Subcutaneous Daily  . labetalol  300 mg Oral QPM  . losartan  25 mg Oral BID  . multivitamin with minerals  1 tablet Oral q AM  . potassium chloride  20 mEq Oral Once  . potassium chloride  20 mEq Oral Once  . rosuvastatin  10 mg Oral QHS   Continuous Infusions: . cefTRIAXone (ROCEPHIN)  IV 2 g (02/11/20 1245)   PRN Meds:.acetaminophen, loratadine, ondansetron (ZOFRAN) IV   PHYSICAL EXAM: Vital signs: Vitals:   02/11/20 1624 02/11/20 2128 02/12/20 0500 02/12/20 0755  BP: (!) 151/75 (!) 146/72  (!) 167/74  Pulse: 75 72  80  Resp: 19 18  19   Temp: 98.2 F (36.8 C) 98.2 F (36.8 C)  98.3 F (36.8 C)  TempSrc:  Oral    SpO2: 100% 91%  98%  Weight:   57.7 kg   Height:       Filed Weights   02/11/20 0704 02/11/20 1108 02/12/20 0500  Weight: 57 kg 55 kg 57.7 kg   Body mass index is 22.53 kg/m.   Gen Exam:Alert awake-not in any distress HEENT:atraumatic, normocephalic Chest: B/L clear to auscultation anteriorly CVS:S1S2 regular Abdomen:soft non tender, non distended Extremities:no edema Neurology: Non focal Skin: no rash  I have personally reviewed following labs and imaging studies  LABORATORY DATA: CBC: Recent Labs  Lab 02/08/20 1715 02/08/20 2355 02/09/20 0405 02/10/20 0907 02/11/20 0606  WBC 8.6 9.4 9.7 6.0 5.8  NEUTROABS 7.6  --   --  4.4  --   HGB 10.1* 10.1* 9.5* 10.8* 10.0*  HCT 34.0* 33.7* 31.4* 35.5* 33.3*  MCV 84.4 83.6 85.1 83.3 85.2  PLT 213 251 249 227 287    Basic Metabolic Panel: Recent Labs  Lab 02/08/20 1715 02/08/20 2355 02/09/20 0405 02/10/20 0907 02/11/20 0326  NA 136  --  136 138 136  K 3.9  --  3.6 3.4* 3.6  CL 96*  --  95* 96* 95*  CO2 27  --  22 29 28   GLUCOSE 190*  --  179* 65* 171*  BUN 16  --  23* 10 19  CREATININE 4.61* 5.08* 5.42* 3.44* 4.59*  CALCIUM 9.0  --  8.8* 9.3 9.1  PHOS  --   --   --   3.7 3.9    GFR: Estimated Creatinine Clearance: 13.3 mL/min (A) (by C-G formula based on SCr of 4.59 mg/dL (H)).  Liver Function Tests: Recent Labs  Lab 02/10/20 0907 02/11/20 0326  ALBUMIN 3.1* 2.7*   No results for input(s): LIPASE, AMYLASE in the last 168 hours. No results for input(s): AMMONIA in the last 168 hours.  Coagulation Profile: No results for input(s): INR, PROTIME in the last 168 hours.  Cardiac Enzymes: No results for input(s): CKTOTAL, CKMB, CKMBINDEX, TROPONINI in the last 168 hours.  BNP (last 3 results) No results for input(s): PROBNP in the last 8760 hours.  Lipid Profile: No results for input(s): CHOL, HDL, LDLCALC, TRIG, CHOLHDL, LDLDIRECT in the last 72 hours.  Thyroid Function Tests: No results for input(s): TSH, T4TOTAL, FREET4, T3FREE, THYROIDAB in the last 72 hours.  Anemia Panel: No results for input(s): VITAMINB12, FOLATE, FERRITIN, TIBC, IRON, RETICCTPCT in the last 72 hours.  Urine analysis:    Component Value Date/Time   COLORURINE YELLOW 03/29/2014 [place] 03/29/2014 1734   LABSPEC 1.015 03/29/2014 1734   PHURINE 8.0 03/29/2014 1734   GLUCOSEU >1000 (A) 03/29/2014 1734   HGBUR SMALL (A) 03/29/2014 1734   BILIRUBINUR NEGATIVE 03/29/2014 1734   KETONESUR NEGATIVE 03/29/2014 1734   PROTEINUR 100 (A) 03/29/2014 1734   UROBILINOGEN 0.2 03/29/2014 1734   NITRITE NEGATIVE 03/29/2014 1734   LEUKOCYTESUR TRACE (A) 03/29/2014 1734    Sepsis Labs: Lactic Acid, Venous    Component Value Date/Time   LATICACIDVEN 1.5 02/09/2020 0959    MICROBIOLOGY: Recent Results (from the past 240 hour(s))  Culture, blood (routine x 2)     Status: Abnormal   Collection Time: 02/08/20  6:04 PM   Specimen: BLOOD  Result Value Ref Range Status   Specimen Description BLOOD SITE NOT SPECIFIED  Final   Special Requests   Final    BOTTLES DRAWN AEROBIC AND ANAEROBIC Blood Culture results may not be optimal due to an inadequate volume  of blood received in culture bottles  Culture  Setup Time   Final    GRAM POSITIVE COCCI IN CHAINS IN BOTH AEROBIC AND ANAEROBIC BOTTLES CRITICAL VALUE NOTED.  VALUE IS CONSISTENT WITH PREVIOUSLY REPORTED AND CALLED VALUE.    Culture (A)  Final    STREPTOCOCCUS VESTIBULARIS SUSCEPTIBILITIES PERFORMED ON PREVIOUS CULTURE WITHIN THE LAST 5 DAYS. Performed at [facility], [place] [address], [place], [place] [address]    Report Status 02/11/2020 FINAL  Final  Culture, blood (routine x 2)     Status: Abnormal   Collection Time: 02/08/20  6:15 PM   Specimen: BLOOD  Result Value Ref Range Status   Specimen Description BLOOD LEFT ANTECUBITAL  Final   Special Requests   Final    BOTTLES DRAWN AEROBIC AND ANAEROBIC Blood Culture adequate volume   Culture  Setup Time   Final    GRAM POSITIVE COCCI IN CHAINS IN BOTH AEROBIC AND ANAEROBIC BOTTLES CRITICAL RESULT CALLED TO, READ BACK BY AND VERIFIED WITH: Park Breed [id number] FCP Performed at [facility], [place] [address] [place], [place] [address]    Culture STREPTOCOCCUS VESTIBULARIS (A)  Final   Report Status 02/11/2020 FINAL  Final   Organism ID, Bacteria STREPTOCOCCUS VESTIBULARIS  Final      Susceptibility   Streptococcus vestibularis - MIC*    TETRACYCLINE 0.5 SENSITIVE Sensitive     VANCOMYCIN 0.5 SENSITIVE Sensitive     CLINDAMYCIN <=0.25 SENSITIVE Sensitive     PENICILLIN Value in next row Sensitive      SENSITIVE0.12    CEFTRIAXONE Value in next row Sensitive      SENSITIVE0.12    * STREPTOCOCCUS VESTIBULARIS  Blood Culture ID Panel (Reflexed)     Status: Abnormal   Collection Time: 02/08/20  6:15 PM  Result Value Ref Range Status   Enterococcus species NOT DETECTED NOT DETECTED Final   Listeria monocytogenes NOT DETECTED NOT DETECTED Final   Staphylococcus species NOT DETECTED NOT DETECTED Final   Staphylococcus aureus (BCID) NOT DETECTED NOT DETECTED Final   Streptococcus species DETECTED (A) NOT  DETECTED Final    Comment: Not Enterococcus species, Streptococcus agalactiae, Streptococcus pyogenes, or Streptococcus pneumoniae. CRITICAL RESULT CALLED TO, READ BACK BY AND VERIFIED WITH: PHARMD JEREMY F. [id number] [id number] FCP    Streptococcus agalactiae NOT DETECTED NOT DETECTED Final   Streptococcus pneumoniae NOT DETECTED NOT DETECTED Final   Streptococcus pyogenes NOT DETECTED NOT DETECTED Final   Acinetobacter baumannii NOT DETECTED NOT DETECTED Final   Enterobacteriaceae species NOT DETECTED NOT DETECTED Final   Enterobacter cloacae complex NOT DETECTED NOT DETECTED Final   Escherichia coli NOT DETECTED NOT DETECTED Final   Klebsiella oxytoca NOT DETECTED NOT DETECTED Final   Klebsiella pneumoniae NOT DETECTED NOT DETECTED Final   Proteus species NOT DETECTED NOT DETECTED Final   Serratia marcescens NOT DETECTED NOT DETECTED Final   Haemophilus influenzae NOT DETECTED NOT DETECTED Final   Neisseria meningitidis NOT DETECTED NOT DETECTED Final   Pseudomonas aeruginosa NOT DETECTED NOT DETECTED Final   Candida albicans NOT DETECTED NOT DETECTED Final   Candida glabrata NOT DETECTED NOT DETECTED Final   Candida krusei NOT DETECTED NOT DETECTED Final   Candida parapsilosis NOT DETECTED NOT DETECTED Final   Candida tropicalis NOT DETECTED NOT DETECTED Final    Comment: Performed at [facility], [place] [address] [place], [place] [address]  SARS Coronavirus 2 by RT PCR (hospital order, performed in [facility] hospital lab) Nasopharyngeal Nasopharyngeal Swab  Status: None   Collection Time: 02/08/20  8:48 PM   Specimen: Nasopharyngeal Swab  Result Value Ref Range Status   SARS Coronavirus 2 NEGATIVE NEGATIVE Final    Comment: (NOTE) SARS-CoV-2 target nucleic acids are NOT DETECTED.  The SARS-CoV-2 RNA is generally detectable in upper and lower respiratory specimens during the acute phase of infection. The lowest concentration of SARS-CoV-2 viral copies this assay can detect is  250 copies / mL. A negative result does not preclude SARS-CoV-2 infection and should not be used as the sole basis for treatment or other patient management decisions.  A negative result may occur with improper specimen collection / handling, submission of specimen other than nasopharyngeal swab, presence of viral mutation(s) within the areas targeted by this assay, and inadequate number of viral copies (<250 copies / mL). A negative result must be combined with clinical observations, patient history, and epidemiological information.  Fact Sheet for Patients:   StrictlyIdeas.no  Fact Sheet for Healthcare Providers: BankingDealers.co.za  This test is not yet approved or  cleared by the [place] FDA and has been authorized for detection and/or diagnosis of SARS-CoV-2 by FDA under an Emergency Use Authorization (EUA).  This EUA will remain in effect (meaning this test can be used) for the duration of the COVID-19 declaration under Section 564(b)(1) of the Act, 21 U.S.C. section 360bbb-3(b)(1), unless the authorization is terminated or revoked sooner.  Performed at [facility], [place] [address], [place], [place] [address]   MRSA PCR Screening     Status: None   Collection Time: 02/09/20 11:32 AM  Result Value Ref Range Status   MRSA by PCR NEGATIVE NEGATIVE Final    Comment:        The GeneXpert MRSA Assay (FDA approved for NASAL specimens only), is one component of a comprehensive MRSA colonization surveillance program. It is not intended to diagnose MRSA infection nor to guide or monitor treatment for MRSA infections. Performed at [facility], [place] [address] [place], [place] [address]   Respiratory Panel by PCR     Status: None   Collection Time: 02/09/20  5:30 PM   Specimen: Nasopharyngeal Swab; Respiratory  Result Value Ref Range Status   Adenovirus NOT DETECTED NOT DETECTED Final   Coronavirus 229E NOT  DETECTED NOT DETECTED Final    Comment: (NOTE) The Coronavirus on the Respiratory Panel, DOES NOT test for the novel  Coronavirus (2019 nCoV)    Coronavirus HKU1 NOT DETECTED NOT DETECTED Final   Coronavirus NL63 NOT DETECTED NOT DETECTED Final   Coronavirus OC43 NOT DETECTED NOT DETECTED Final   Metapneumovirus NOT DETECTED NOT DETECTED Final   Rhinovirus / Enterovirus NOT DETECTED NOT DETECTED Final   Influenza A NOT DETECTED NOT DETECTED Final   Influenza B NOT DETECTED NOT DETECTED Final   Parainfluenza Virus 1 NOT DETECTED NOT DETECTED Final   Parainfluenza Virus 2 NOT DETECTED NOT DETECTED Final   Parainfluenza Virus 3 NOT DETECTED NOT DETECTED Final   Parainfluenza Virus 4 NOT DETECTED NOT DETECTED Final   Respiratory Syncytial Virus NOT DETECTED NOT DETECTED Final   Bordetella pertussis NOT DETECTED NOT DETECTED Final   Chlamydophila pneumoniae NOT DETECTED NOT DETECTED Final   Mycoplasma pneumoniae NOT DETECTED NOT DETECTED Final    Comment: Performed at [facility], [place]. [address], [place], [place] [address]  Culture, blood (routine x 2)     Status: None (Preliminary result)   Collection Time: 02/10/20  9:02 AM  Specimen: BLOOD  Result Value Ref Range Status   Specimen Description BLOOD LEFT ANTECUBITAL  Final   Special Requests   Final    BOTTLES DRAWN AEROBIC AND ANAEROBIC Blood Culture adequate volume   Culture   Final    NO GROWTH 2 DAYS Performed at [facility], [address] [place], [place] [address]    Report Status PENDING  Incomplete  Culture, blood (routine x 2)     Status: None (Preliminary result)   Collection Time: 02/10/20  9:05 AM   Specimen: BLOOD LEFT HAND  Result Value Ref Range Status   Specimen Description BLOOD LEFT HAND  Final   Special Requests   Final    BOTTLES DRAWN AEROBIC AND ANAEROBIC Blood Culture adequate volume   Culture   Final    NO GROWTH 2 DAYS Performed at [facility], [place] [address], [place],  [place] [address]    Report Status PENDING  Incomplete    RADIOLOGY STUDIES/RESULTS: ECHOCARDIOGRAM COMPLETE  Result Date: 02/11/2020    ECHOCARDIOGRAM REPORT   Patient Name:   FRANKIE ZITO Date of Exam: 02/11/2020 Medical Rec #:  [id number]         Height:       63.0 in Accession #:    [id number]        Weight:       121.3 lb Date of Birth:  November 01, 1975        BSA:          1.563 m Patient Age:    75 years          BP:           165/68 mmHg Patient Gender: F                 HR:           76 bpm. Exam Location:  Inpatient Procedure: 2D Echo Indications:    Bacteremia 790.7 / R78.81  History:        Patient has prior history of Echocardiogram examinations, most                 recent 10/21/2019. Risk Factors:Diabetes, Dyslipidemia, Non-Smoker                 and Hypertension. ESRD, Pneumonia.  Sonographer:    Leavy Cella Referring Phys: [place]  1. Left ventricular ejection fraction, by estimation, is 55 to 60%. The left ventricle has normal function. The left ventricle has no regional wall motion abnormalities. There is mild concentric left ventricular hypertrophy. Left ventricular diastolic parameters are consistent with Grade II diastolic dysfunction (pseudonormalization).  2. Right ventricular systolic function is normal. The right ventricular size is mildly enlarged. There is moderately elevated pulmonary artery systolic pressure.  3. Left atrial size was moderately dilated.  4. Right atrial size was moderately dilated.  5. The mitral valve is normal in structure. Trivial mitral valve regurgitation.  6. Tricuspid valve regurgitation is severe.  7. The aortic valve is tricuspid. Aortic valve regurgitation is not visualized. Mild aortic valve sclerosis is present, with no evidence of aortic valve stenosis.  8. The inferior vena cava is normal in size with <50% respiratory variability, suggesting right atrial pressure of 8 mmHg. Comparison(s): No significant change from prior study.  Conclusion(s)/Recommendation(s): No evidence of valvular vegetations on this transthoracic echocardiogram. Would recommend a transesophageal echocardiogram to exclude infective endocarditis if clinically indicated. FINDINGS  Left Ventricle: Left  ventricular ejection fraction, by estimation, is 55 to 60%. The left ventricle has normal function. The left ventricle has no regional wall motion abnormalities. The left ventricular internal cavity size was normal in size. There is  mild concentric left ventricular hypertrophy. Left ventricular diastolic parameters are consistent with Grade II diastolic dysfunction (pseudonormalization). Right Ventricle: The right ventricular size is mildly enlarged. Right vetricular wall thickness was not assessed. Right ventricular systolic function is normal. There is moderately elevated pulmonary artery systolic pressure. The tricuspid regurgitant velocity is 3.23 m/s, and with an assumed right atrial pressure of 8 mmHg, the estimated right ventricular systolic pressure is 27.0 mmHg. Left Atrium: Left atrial size was moderately dilated. Right Atrium: Right atrial size was moderately dilated. Pericardium: A small pericardial effusion is present. Mitral Valve: The mitral valve is normal in structure. Trivial mitral valve regurgitation. Tricuspid Valve: The tricuspid valve is normal in structure. Tricuspid valve regurgitation is severe. Aortic Valve: The aortic valve is tricuspid. Aortic valve regurgitation is not visualized. Mild aortic valve sclerosis is present, with no evidence of aortic valve stenosis. There is mild calcification of the aortic valve. Pulmonic Valve: The pulmonic valve was grossly normal. Pulmonic valve regurgitation is mild. Aorta: The aortic root, ascending aorta and aortic arch are all structurally normal, with no evidence of dilitation or obstruction. Venous: The inferior vena cava is normal in size with less than 50% respiratory variability, suggesting right  atrial pressure of 8 mmHg. IAS/Shunts: The atrial septum is grossly normal.  LEFT VENTRICLE PLAX 2D LVIDd:         4.30 cm  Diastology LVIDs:         2.90 cm  LV e' lateral:   8.70 cm/s LV PW:         1.20 cm  LV E/e' lateral: 13.2 LV IVS:        1.00 cm  LV e' medial:    7.83 cm/s LVOT diam:     1.70 cm  LV E/e' medial:  14.7 LVOT Area:     2.27 cm  RIGHT VENTRICLE RV S prime:     12.20 cm/s TAPSE (M-mode): 1.7 cm LEFT ATRIUM           Index       RIGHT ATRIUM           Index LA diam:      4.20 cm 2.69 cm/m  RA Area:     17.70 cm LA Vol (A2C): 37.1 ml 23.73 ml/m RA Volume:   63.20 ml  40.43 ml/m LA Vol (A4C): 58.4 ml 37.36 ml/m   AORTA Ao Root diam: 2.50 cm MITRAL VALVE                TRICUSPID VALVE MV Area (PHT): 2.99 cm     TR Peak grad:   41.7 mmHg MV Decel Time: 254 msec     TR Vmax:        323.00 cm/s MV E velocity: 115.00 cm/s MV A velocity: 107.00 cm/s  SHUNTS MV E/A ratio:  1.07         Systemic Diam: 1.70 cm Buford Dresser MD Electronically signed by Buford Dresser MD Signature Date/Time: 02/11/2020/4:13:50 PM    Final      LOS: 3 days   Oren Binet, MD  [facility]    To contact the attending provider between 7A-7P or the covering provider during after hours 7P-7A, please log into the web site [email or web address] and access using universal [place]  password for that web site. If you do not have the password, please call the hospital operator.  02/12/2020, 11:08 AM

## 2020-02-12 NOTE — Progress Notes (Signed)
Subjective: Patient sounds to be constipated   Antibiotics:  Anti-infectives (From admission, onward)   Start     Dose/Rate Route Frequency Ordered Stop   02/09/20 1800  cefTRIAXone (ROCEPHIN) 1 g in sodium chloride 0.9 % 100 mL IVPB  Status:  Discontinued        1 g 200 mL/hr over 30 Minutes Intravenous Every 24 hours 02/08/20 2311 02/09/20 1137   02/09/20 1800  doxycycline (VIBRAMYCIN) 100 mg in sodium chloride 0.9 % 250 mL IVPB  Status:  Discontinued        100 mg 125 mL/hr over 120 Minutes Intravenous Every 12 hours 02/08/20 2311 02/09/20 1137   02/09/20 1300  cefTRIAXone (ROCEPHIN) 2 g in sodium chloride 0.9 % 100 mL IVPB     Discontinue     2 g 200 mL/hr over 30 Minutes Intravenous Every 24 hours 02/09/20 1137     02/08/20 2030  cefTRIAXone (ROCEPHIN) 1 g in sodium chloride 0.9 % 100 mL IVPB        1 g 200 mL/hr over 30 Minutes Intravenous  Once 02/08/20 2027 02/08/20 2205   02/08/20 2030  azithromycin (ZITHROMAX) 500 mg in sodium chloride 0.9 % 250 mL IVPB        500 mg 250 mL/hr over 60 Minutes Intravenous  Once 02/08/20 2027 02/08/20 2346      Medications: Scheduled Meds: . amLODipine  10 mg Oral QHS  . aspirin EC  81 mg Oral QHS  . calcium acetate  667 mg Oral TID WC   And  . calcium acetate  667 mg Oral With snacks  . Chlorhexidine Gluconate Cloth  6 each Topical Q0600  . [START ON 02/14/2020] darbepoetin (ARANESP) injection - DIALYSIS  100 mcg Intravenous Q Tue-HD  . doxercalciferol  1 mcg Intravenous Q T,Th,Sa-HD  . heparin  5,000 Units Subcutaneous Q8H  . insulin aspart  0-6 Units Subcutaneous TID WC  . insulin aspart  2 Units Subcutaneous TID WC  . insulin glargine  6 Units Subcutaneous Daily  . labetalol  300 mg Oral QPM  . losartan  25 mg Oral BID  . multivitamin with minerals  1 tablet Oral q AM  . potassium chloride  20 mEq Oral Once  . potassium chloride  20 mEq Oral Once  . rosuvastatin  10 mg Oral QHS   Continuous Infusions: .  cefTRIAXone (ROCEPHIN)  IV 2 g (02/12/20 1308)   PRN Meds:.acetaminophen, loratadine, ondansetron (ZOFRAN) IV    Objective: Weight change:  No intake or output data in the 24 hours ending 02/12/20 1340 Blood pressure (!) 167/74, pulse 80, temperature 98.3 F (36.8 C), resp. rate 19, height 5\' 3"  (1.6 m), weight 57.7 kg, SpO2 98 %. Temp:  [98.2 F (36.8 C)-98.3 F (36.8 C)] 98.3 F (36.8 C) (07/25 0755) Pulse Rate:  [72-80] 80 (07/25 0755) Resp:  [18-19] 19 (07/25 0755) BP: (146-167)/(72-75) 167/74 (07/25 0755) SpO2:  [91 %-100 %] 98 % (07/25 0755) Weight:  [57.7 kg] 57.7 kg (07/25 0500)  Physical Exam: General: Alert and awake, oriented x3, not in any acute distress. HEENT: anicteric sclera, EOMI CVS regular rate, normal  Chest: , no wheezing, no respiratory distress Abdomen: soft non-distended,  Extremities: no edema or deformity noted bilaterally Skin: Rashes stable Neuro: nonfocal  CBC:    BMET Recent Labs    02/10/20 0907 02/11/20 0326  NA 138 136  K 3.4* 3.6  CL 96* 95*  CO2 29 28  GLUCOSE 65* 171*  BUN 10 19  CREATININE 3.44* 4.59*  CALCIUM 9.3 9.1     Liver Panel  Recent Labs    02/10/20 0907 02/11/20 0326  ALBUMIN 3.1* 2.7*       Sedimentation Rate No results for input(s): ESRSEDRATE in the last 72 hours. C-Reactive Protein No results for input(s): CRP in the last 72 hours.  Micro Results: Recent Results (from the past 720 hour(s))  Culture, blood (routine x 2)     Status: Abnormal   Collection Time: 02/08/20  6:04 PM   Specimen: BLOOD  Result Value Ref Range Status   Specimen Description BLOOD SITE NOT SPECIFIED  Final   Special Requests   Final    BOTTLES DRAWN AEROBIC AND ANAEROBIC Blood Culture results Faith not be optimal due to an inadequate volume of blood received in culture bottles   Culture  Setup Time   Final    GRAM POSITIVE COCCI IN CHAINS IN BOTH AEROBIC AND ANAEROBIC BOTTLES CRITICAL VALUE NOTED.  VALUE IS CONSISTENT  WITH PREVIOUSLY REPORTED AND CALLED VALUE.    Culture (A)  Final    STREPTOCOCCUS VESTIBULARIS SUSCEPTIBILITIES PERFORMED ON PREVIOUS CULTURE WITHIN THE LAST 5 DAYS. Performed at [facility], [place] [address], [place], [place] [address]    Report Status 02/11/2020 FINAL  Final  Culture, blood (routine x 2)     Status: Abnormal   Collection Time: 02/08/20  6:15 PM   Specimen: BLOOD  Result Value Ref Range Status   Specimen Description BLOOD LEFT ANTECUBITAL  Final   Special Requests   Final    BOTTLES DRAWN AEROBIC AND ANAEROBIC Blood Culture adequate volume   Culture  Setup Time   Final    GRAM POSITIVE COCCI IN CHAINS IN BOTH AEROBIC AND ANAEROBIC BOTTLES CRITICAL RESULT CALLED TO, READ BACK BY AND VERIFIED WITH: Park Breed [id number] FCP Performed at [facility], [place] [address] [place],  [address]    Culture STREPTOCOCCUS VESTIBULARIS (A)  Final   Report Status 02/11/2020 FINAL  Final   Organism ID, Bacteria STREPTOCOCCUS VESTIBULARIS  Final      Susceptibility   Streptococcus vestibularis - MIC*    TETRACYCLINE 0.5 SENSITIVE Sensitive     VANCOMYCIN 0.5 SENSITIVE Sensitive     CLINDAMYCIN <=0.25 SENSITIVE Sensitive     PENICILLIN Value in next row Sensitive      SENSITIVE0.12    CEFTRIAXONE Value in next row Sensitive      SENSITIVE0.12    * STREPTOCOCCUS VESTIBULARIS  Blood Culture ID Panel (Reflexed)     Status: Abnormal   Collection Time: 02/08/20  6:15 PM  Result Value Ref Range Status   Enterococcus species NOT DETECTED NOT DETECTED Final   Listeria monocytogenes NOT DETECTED NOT DETECTED Final   Staphylococcus species NOT DETECTED NOT DETECTED Final   Staphylococcus aureus (BCID) NOT DETECTED NOT DETECTED Final   Streptococcus species DETECTED (A) NOT DETECTED Final    Comment: Not Enterococcus species, Streptococcus agalactiae, Streptococcus pyogenes, or Streptococcus pneumoniae. CRITICAL RESULT CALLED TO, READ BACK BY AND VERIFIED  WITH: PHARMD JEREMY F. [phone] [id number] FCP    Streptococcus agalactiae NOT DETECTED NOT DETECTED Final   Streptococcus pneumoniae NOT DETECTED NOT DETECTED Final   Streptococcus pyogenes NOT DETECTED NOT DETECTED Final   Acinetobacter baumannii NOT DETECTED NOT DETECTED Final   Enterobacteriaceae species NOT DETECTED NOT DETECTED Final   Enterobacter cloacae complex NOT DETECTED NOT DETECTED Final   Escherichia coli  NOT DETECTED NOT DETECTED Final   Klebsiella oxytoca NOT DETECTED NOT DETECTED Final   Klebsiella pneumoniae NOT DETECTED NOT DETECTED Final   Proteus species NOT DETECTED NOT DETECTED Final   Serratia marcescens NOT DETECTED NOT DETECTED Final   Haemophilus influenzae NOT DETECTED NOT DETECTED Final   Neisseria meningitidis NOT DETECTED NOT DETECTED Final   Pseudomonas aeruginosa NOT DETECTED NOT DETECTED Final   Candida albicans NOT DETECTED NOT DETECTED Final   Candida glabrata NOT DETECTED NOT DETECTED Final   Candida krusei NOT DETECTED NOT DETECTED Final   Candida parapsilosis NOT DETECTED NOT DETECTED Final   Candida tropicalis NOT DETECTED NOT DETECTED Final    Comment: Performed at [facility], [place] [address] [place], [place] [address]  SARS Coronavirus 2 by RT PCR (hospital order, performed in [facility] hospital lab) Nasopharyngeal Nasopharyngeal Swab     Status: None   Collection Time: 02/08/20  8:48 PM   Specimen: Nasopharyngeal Swab  Result Value Ref Range Status   SARS Coronavirus 2 NEGATIVE NEGATIVE Final    Comment: (NOTE) SARS-CoV-2 target nucleic acids are NOT DETECTED.  The SARS-CoV-2 RNA is generally detectable in upper and lower respiratory specimens during the acute phase of infection. The lowest concentration of SARS-CoV-2 viral copies this assay can detect is 250 copies / mL. A negative result does not preclude SARS-CoV-2 infection and should not be used as the sole basis for treatment or other patient management decisions.  A negative  result Faith occur with improper specimen collection / handling, submission of specimen other than nasopharyngeal swab, presence of viral mutation(s) within the areas targeted by this assay, and inadequate number of viral copies (<250 copies / mL). A negative result must be combined with clinical observations, patient history, and epidemiological information.  Fact Sheet for Patients:   StrictlyIdeas.no  Fact Sheet for Healthcare Providers: BankingDealers.co.za  This test is not yet approved or  cleared by the [place] FDA and has been authorized for detection and/or diagnosis of SARS-CoV-2 by FDA under an Emergency Use Authorization (EUA).  This EUA will remain in effect (meaning this test can be used) for the duration of the COVID-19 declaration under Section 564(b)(1) of the Act, 21 U.S.C. section 360bbb-3(b)(1), unless the authorization is terminated or revoked sooner.  Performed at [facility], [place] [address], [place], [place] [address]   MRSA PCR Screening     Status: None   Collection Time: 02/09/20 11:32 AM  Result Value Ref Range Status   MRSA by PCR NEGATIVE NEGATIVE Final    Comment:        The GeneXpert MRSA Assay (FDA approved for NASAL specimens only), is one component of a comprehensive MRSA colonization surveillance program. It is not intended to diagnose MRSA infection nor to guide or monitor treatment for MRSA infections. Performed at [facility], [place] [address], [place], [place] [address]   Respiratory Panel by PCR     Status: None   Collection Time: 02/09/20  5:30 PM   Specimen: Nasopharyngeal Swab; Respiratory  Result Value Ref Range Status   Adenovirus NOT DETECTED NOT DETECTED Final   Coronavirus 229E NOT DETECTED NOT DETECTED Final    Comment: (NOTE) The Coronavirus on the Respiratory Panel, DOES NOT test for the novel  Coronavirus (2019 nCoV)    Coronavirus HKU1 NOT DETECTED NOT  DETECTED Final   Coronavirus NL63 NOT DETECTED NOT DETECTED Final   Coronavirus OC43 NOT DETECTED NOT DETECTED Final  Metapneumovirus NOT DETECTED NOT DETECTED Final   Rhinovirus / Enterovirus NOT DETECTED NOT DETECTED Final   Influenza A NOT DETECTED NOT DETECTED Final   Influenza B NOT DETECTED NOT DETECTED Final   Parainfluenza Virus 1 NOT DETECTED NOT DETECTED Final   Parainfluenza Virus 2 NOT DETECTED NOT DETECTED Final   Parainfluenza Virus 3 NOT DETECTED NOT DETECTED Final   Parainfluenza Virus 4 NOT DETECTED NOT DETECTED Final   Respiratory Syncytial Virus NOT DETECTED NOT DETECTED Final   Bordetella pertussis NOT DETECTED NOT DETECTED Final   Chlamydophila pneumoniae NOT DETECTED NOT DETECTED Final   Mycoplasma pneumoniae NOT DETECTED NOT DETECTED Final    Comment: Performed at [facility], [place] [address] [place], [place] [address]  Culture, blood (routine x 2)     Status: None (Preliminary result)   Collection Time: 02/10/20  9:02 AM   Specimen: BLOOD  Result Value Ref Range Status   Specimen Description BLOOD LEFT ANTECUBITAL  Final   Special Requests   Final    BOTTLES DRAWN AEROBIC AND ANAEROBIC Blood Culture adequate volume   Culture   Final    NO GROWTH 2 DAYS Performed at [facility], [place] [address], [place], [place] [address]    Report Status PENDING  Incomplete  Culture, blood (routine x 2)     Status: None (Preliminary result)   Collection Time: 02/10/20  9:05 AM   Specimen: BLOOD LEFT HAND  Result Value Ref Range Status   Specimen Description BLOOD LEFT HAND  Final   Special Requests   Final    BOTTLES DRAWN AEROBIC AND ANAEROBIC Blood Culture adequate volume   Culture   Final    NO GROWTH 2 DAYS Performed at [facility], [place] [address], [place], [place] [address]    Report Status PENDING  Incomplete    Studies/Results: ECHOCARDIOGRAM COMPLETE  Result Date: 02/11/2020    ECHOCARDIOGRAM REPORT   Patient Name:   [name]  Date of Exam: 02/11/2020 Medical Rec #:  [id number]         Height:       63.0 in Accession #:    [id number]        Weight:       121.3 lb Date of Birth:  22-Faith-1977        BSA:          1.563 m Patient Age:    44 years          BP:           165/68 mmHg Patient Gender: F                 HR:           76 bpm. Exam Location:  Inpatient Procedure: 2D Echo Indications:    Bacteremia 790.7 / R78.81  History:        Patient has prior history of Echocardiogram examinations, most                 recent 10/21/2019. Risk Factors:Diabetes, Dyslipidemia, Non-Smoker                 and Hypertension. ESRD, Pneumonia.  Sonographer:    Leavy Cella Referring Phys: [place]  1. Left ventricular ejection fraction, by estimation, is 55 to 60%. The left ventricle has normal function. The left ventricle has no regional wall motion abnormalities. There is mild concentric left ventricular hypertrophy. Left ventricular  diastolic parameters are consistent with Grade II diastolic dysfunction (pseudonormalization).  2. Right ventricular systolic function is normal. The right ventricular size is mildly enlarged. There is moderately elevated pulmonary artery systolic pressure.  3. Left atrial size was moderately dilated.  4. Right atrial size was moderately dilated.  5. The mitral valve is normal in structure. Trivial mitral valve regurgitation.  6. Tricuspid valve regurgitation is severe.  7. The aortic valve is tricuspid. Aortic valve regurgitation is not visualized. Mild aortic valve sclerosis is present, with no evidence of aortic valve stenosis.  8. The inferior vena cava is normal in size with <50% respiratory variability, suggesting right atrial pressure of 8 mmHg. Comparison(s): No significant change from prior study. Conclusion(s)/Recommendation(s): No evidence of valvular vegetations on this transthoracic echocardiogram. Would recommend a transesophageal echocardiogram to exclude infective endocarditis if  clinically indicated. FINDINGS  Left Ventricle: Left ventricular ejection fraction, by estimation, is 55 to 60%. The left ventricle has normal function. The left ventricle has no regional wall motion abnormalities. The left ventricular internal cavity size was normal in size. There is  mild concentric left ventricular hypertrophy. Left ventricular diastolic parameters are consistent with Grade II diastolic dysfunction (pseudonormalization). Right Ventricle: The right ventricular size is mildly enlarged. Right vetricular wall thickness was not assessed. Right ventricular systolic function is normal. There is moderately elevated pulmonary artery systolic pressure. The tricuspid regurgitant velocity is 3.23 m/s, and with an assumed right atrial pressure of 8 mmHg, the estimated right ventricular systolic pressure is 33.8 mmHg. Left Atrium: Left atrial size was moderately dilated. Right Atrium: Right atrial size was moderately dilated. Pericardium: A small pericardial effusion is present. Mitral Valve: The mitral valve is normal in structure. Trivial mitral valve regurgitation. Tricuspid Valve: The tricuspid valve is normal in structure. Tricuspid valve regurgitation is severe. Aortic Valve: The aortic valve is tricuspid. Aortic valve regurgitation is not visualized. Mild aortic valve sclerosis is present, with no evidence of aortic valve stenosis. There is mild calcification of the aortic valve. Pulmonic Valve: The pulmonic valve was grossly normal. Pulmonic valve regurgitation is mild. Aorta: The aortic root, ascending aorta and aortic arch are all structurally normal, with no evidence of dilitation or obstruction. Venous: The inferior vena cava is normal in size with less than 50% respiratory variability, suggesting right atrial pressure of 8 mmHg. IAS/Shunts: The atrial septum is grossly normal.  LEFT VENTRICLE PLAX 2D LVIDd:         4.30 cm  Diastology LVIDs:         2.90 cm  LV e' lateral:   8.70 cm/s LV PW:          1.20 cm  LV E/e' lateral: 13.2 LV IVS:        1.00 cm  LV e' medial:    7.83 cm/s LVOT diam:     1.70 cm  LV E/e' medial:  14.7 LVOT Area:     2.27 cm  RIGHT VENTRICLE RV S prime:     12.20 cm/s TAPSE (M-mode): 1.7 cm LEFT ATRIUM           Index       RIGHT ATRIUM           Index LA diam:      4.20 cm 2.69 cm/m  RA Area:     17.70 cm LA Vol (A2C): 37.1 ml 23.73 ml/m RA Volume:   63.20 ml  40.43 ml/m LA Vol (A4C): 58.4 ml 37.36 ml/m   AORTA Ao Root  diam: 2.50 cm MITRAL VALVE                TRICUSPID VALVE MV Area (PHT): 2.99 cm     TR Peak grad:   41.7 mmHg MV Decel Time: 254 msec     TR Vmax:        323.00 cm/s MV E velocity: 115.00 cm/s MV A velocity: 107.00 cm/s  SHUNTS MV E/A ratio:  1.07         Systemic Diam: 1.70 cm Buford Dresser MD Electronically signed by Buford Dresser MD Signature Date/Time: 02/11/2020/4:13:50 PM    Final       Assessment/Plan:  INTERVAL HISTORY: 2D echocardiogram did not show vegetation   Principal Problem:   Streptococcal bacteremia Active Problems:   Cerebral infarction ([place])   Essential hypertension, benign   HLD (hyperlipidemia)   Pneumonia   ESRD on hemodialysis ([place])   Diabetes mellitus type I ([place])   Hypoglycemia    [name] is a 44 y.o. female with end-stage renal disease on hemodialysis admitted with fevers, found to have a right middle lobe pneumonia, then found to have Streptococcus vestibularis bacteremia.  #1 Streptococcus vestibularis bacteremia: I am concerned for endocarditis.  She needs a transesophageal echocardiogram and will page the cards master in the morning.  Would also check a Panorex  Continue ceftriaxone   LOS: 3 days   Alcide Evener 02/12/2020, 1:40 PM

## 2020-02-13 ENCOUNTER — Inpatient Hospital Stay (HOSPITAL_COMMUNITY): Payer: Medicare HMO

## 2020-02-13 ENCOUNTER — Inpatient Hospital Stay (HOSPITAL_COMMUNITY): Payer: Medicare HMO | Admitting: Certified Registered"

## 2020-02-13 ENCOUNTER — Encounter (HOSPITAL_COMMUNITY)
Admission: EM | Disposition: A | Discharge status: Home / Self Care | Payer: Self-pay | Source: Home / Self Care | Attending: Internal Medicine

## 2020-02-13 DIAGNOSIS — J189 Pneumonia, unspecified organism: Secondary | ICD-10-CM

## 2020-02-13 DIAGNOSIS — R7881 Bacteremia: Secondary | ICD-10-CM

## 2020-02-13 DIAGNOSIS — E1022 Type 1 diabetes mellitus with diabetic chronic kidney disease: Secondary | ICD-10-CM

## 2020-02-13 DIAGNOSIS — I361 Nonrheumatic tricuspid (valve) insufficiency: Secondary | ICD-10-CM

## 2020-02-13 HISTORY — PX: TEE WITHOUT CARDIOVERSION: SHX5443

## 2020-02-13 LAB — POCT I-STAT, CHEM 8
BUN: 30 mg/dL — ABNORMAL HIGH (ref 6–20)
Calcium, Ion: 1.09 mmol/L — ABNORMAL LOW (ref 1.15–1.40)
Chloride: 96 mmol/L — ABNORMAL LOW (ref 98–111)
Creatinine, Ser: 6.1 mg/dL — ABNORMAL HIGH (ref 0.44–1.00)
Glucose, Bld: 99 mg/dL (ref 70–99)
HCT: 36 % (ref 36.0–46.0)
Hemoglobin: 12.2 g/dL (ref 12.0–15.0)
Potassium: 3.7 mmol/L (ref 3.5–5.1)
Sodium: 137 mmol/L (ref 135–145)
TCO2: 26 mmol/L (ref 22–32)

## 2020-02-13 LAB — GLUCOSE, CAPILLARY
Glucose-Capillary: 100 mg/dL — ABNORMAL HIGH (ref 70–99)
Glucose-Capillary: 168 mg/dL — ABNORMAL HIGH (ref 70–99)
Glucose-Capillary: 197 mg/dL — ABNORMAL HIGH (ref 70–99)
Glucose-Capillary: 258 mg/dL — ABNORMAL HIGH (ref 70–99)
Glucose-Capillary: 291 mg/dL — ABNORMAL HIGH (ref 70–99)
Glucose-Capillary: 40 mg/dL — CL (ref 70–99)
Glucose-Capillary: 52 mg/dL — ABNORMAL LOW (ref 70–99)
Glucose-Capillary: 61 mg/dL — ABNORMAL LOW (ref 70–99)
Glucose-Capillary: 62 mg/dL — ABNORMAL LOW (ref 70–99)
Glucose-Capillary: 80 mg/dL (ref 70–99)

## 2020-02-13 SURGERY — ECHOCARDIOGRAM, TRANSESOPHAGEAL
Anesthesia: Monitor Anesthesia Care

## 2020-02-13 MED ORDER — CEFAZOLIN SODIUM-DEXTROSE 2-4 GM/100ML-% IV SOLN
2.0000 g | INTRAVENOUS | Status: DC
  Administered 2020-02-14: 2 g via INTRAVENOUS
  Filled 2020-02-13: qty 100

## 2020-02-13 MED ORDER — SODIUM CHLORIDE 0.9 % IV SOLN
INTRAVENOUS | Status: DC | PRN
  Administered 2020-02-13: 500 mL via INTRAMUSCULAR

## 2020-02-13 MED ORDER — LIDOCAINE HCL (CARDIAC) PF 100 MG/5ML IV SOSY
PREFILLED_SYRINGE | INTRAVENOUS | Status: DC | PRN
  Administered 2020-02-13: 20 mg via INTRAVENOUS

## 2020-02-13 MED ORDER — PROPOFOL 500 MG/50ML IV EMUL
INTRAVENOUS | Status: DC | PRN
Start: 2020-02-13 — End: 2020-02-13

## 2020-02-13 MED ORDER — BUTAMBEN-TETRACAINE-BENZOCAINE 2-2-14 % EX AERO
INHALATION_SPRAY | CUTANEOUS | Status: DC | PRN
  Administered 2020-02-13: 2 via TOPICAL

## 2020-02-13 MED ORDER — PROPOFOL 500 MG/50ML IV EMUL
INTRAVENOUS | Status: DC | PRN
  Administered 2020-02-13: 100 ug/kg/min via INTRAVENOUS

## 2020-02-13 NOTE — Progress Notes (Signed)
   [place] has been requested to perform a transesophageal echocardiogram on [name] for bacteremia.  After careful review of history and examination, the risks and benefits of transesophageal echocardiogram have been explained including risks of esophageal damage, perforation (1:10,000 risk), bleeding, pharyngeal hematoma as well as other potential complications associated with conscious sedation including aspiration, arrhythmia, respiratory failure and death. Alternatives to treatment were discussed, questions were answered. Patient is willing to proceed.   Procedure is scheduled for today at 13:45 with Dr. Johnsie Cancel.  Darreld Mclean, PA-C 02/13/2020 9:52 AM

## 2020-02-13 NOTE — Transfer of Care (Signed)
Immediate Anesthesia Transfer of Care Note  Patient: [name]  Procedure(s) Performed: TRANSESOPHAGEAL ECHOCARDIOGRAM (TEE) (N/A )  Patient Location: Endoscopy Unit  Anesthesia Type:General  Level of Consciousness: alert  and sedated  Airway & Oxygen Therapy: Patient connected to face mask oxygen  Post-op Assessment: Post -op Vital signs reviewed and stable  Post vital signs: stable  Last Vitals:  Vitals Value Taken Time  BP    Temp    Pulse    Resp    SpO2      Last Pain:  Vitals:   02/13/20 1316  TempSrc: Temporal  PainSc: 0-No pain         Complications: No complications documented.

## 2020-02-13 NOTE — Progress Notes (Signed)
Subjective: No complaints   Antibiotics:  Anti-infectives (From admission, onward)   Start     Dose/Rate Route Frequency Ordered Stop   02/14/20 1800  ceFAZolin (ANCEF) IVPB 2g/100 mL premix     Discontinue     2 g 200 mL/hr over 30 Minutes Intravenous Every T-Th-Sa (1800) 02/13/20 1104     02/09/20 1800  cefTRIAXone (ROCEPHIN) 1 g in sodium chloride 0.9 % 100 mL IVPB  Status:  Discontinued        1 g 200 mL/hr over 30 Minutes Intravenous Every 24 hours 02/08/20 2311 02/09/20 1137   02/09/20 1800  doxycycline (VIBRAMYCIN) 100 mg in sodium chloride 0.9 % 250 mL IVPB  Status:  Discontinued        100 mg 125 mL/hr over 120 Minutes Intravenous Every 12 hours 02/08/20 2311 02/09/20 1137   02/09/20 1300  cefTRIAXone (ROCEPHIN) 2 g in sodium chloride 0.9 % 100 mL IVPB     Discontinue     2 g 200 mL/hr over 30 Minutes Intravenous Every 24 hours 02/09/20 1137 02/14/20 1259   02/08/20 2030  cefTRIAXone (ROCEPHIN) 1 g in sodium chloride 0.9 % 100 mL IVPB        1 g 200 mL/hr over 30 Minutes Intravenous  Once 02/08/20 2027 02/08/20 2205   02/08/20 2030  azithromycin (ZITHROMAX) 500 mg in sodium chloride 0.9 % 250 mL IVPB        500 mg 250 mL/hr over 60 Minutes Intravenous  Once 02/08/20 2027 02/08/20 2346      Medications: Scheduled Meds: . amLODipine  10 mg Oral QHS  . aspirin EC  81 mg Oral QHS  . calcium acetate  667 mg Oral TID WC   And  . calcium acetate  667 mg Oral With snacks  . Chlorhexidine Gluconate Cloth  6 each Topical Q0600  . [START ON 02/14/2020] darbepoetin (ARANESP) injection - DIALYSIS  100 mcg Intravenous Q Tue-HD  . doxercalciferol  1 mcg Intravenous Q T,Th,Sa-HD  . heparin  5,000 Units Subcutaneous Q8H  . insulin aspart  0-6 Units Subcutaneous TID WC  . insulin aspart  2 Units Subcutaneous TID WC  . insulin glargine  6 Units Subcutaneous Daily  . labetalol  300 mg Oral QPM  . losartan  25 mg Oral BID  . multivitamin with minerals  1 tablet Oral q AM    . rosuvastatin  10 mg Oral QHS   Continuous Infusions: . [START ON 02/14/2020]  ceFAZolin (ANCEF) IV    . cefTRIAXone (ROCEPHIN)  IV 2 g (02/12/20 1308)   PRN Meds:.acetaminophen, loratadine, ondansetron (ZOFRAN) IV    Objective: Weight change: 3.1 kg No intake or output data in the 24 hours ending 02/13/20 1113 Blood pressure (!) 157/65, pulse 74, temperature 98.1 F (36.7 C), resp. rate 16, height 5\' 3"  (1.6 m), weight 60.1 kg, SpO2 93 %. Temp:  [97.8 F (36.6 C)-98.7 F (37.1 C)] 98.1 F (36.7 C) (07/26 0747) Pulse Rate:  [69-74] 74 (07/26 0747) Resp:  [16-18] 16 (07/26 0747) BP: (157-176)/(65-77) 157/65 (07/26 0747) SpO2:  [93 %-97 %] 93 % (07/26 0747) Weight:  [60.1 kg] 60.1 kg (07/26 0300)  Physical Exam: General: Alert and awake, oriented x3, not in any acute distress walking around room to door  Neuro: nonfocal  CBC:    BMET Recent Labs    02/11/20 0326  NA 136  K 3.6  CL 95*  CO2 28  GLUCOSE 171*  BUN 19  CREATININE 4.59*  CALCIUM 9.1     Liver Panel  Recent Labs    02/11/20 0326  ALBUMIN 2.7*       Sedimentation Rate No results for input(s): ESRSEDRATE in the last 72 hours. C-Reactive Protein No results for input(s): CRP in the last 72 hours.  Micro Results: Recent Results (from the past 720 hour(s))  Culture, blood (routine x 2)     Status: Abnormal   Collection Time: 02/08/20  6:04 PM   Specimen: BLOOD  Result Value Ref Range Status   Specimen Description BLOOD SITE NOT SPECIFIED  Final   Special Requests   Final    BOTTLES DRAWN AEROBIC AND ANAEROBIC Blood Culture results may not be optimal due to an inadequate volume of blood received in culture bottles   Culture  Setup Time   Final    GRAM POSITIVE COCCI IN CHAINS IN BOTH AEROBIC AND ANAEROBIC BOTTLES CRITICAL VALUE NOTED.  VALUE IS CONSISTENT WITH PREVIOUSLY REPORTED AND CALLED VALUE.    Culture (A)  Final    STREPTOCOCCUS VESTIBULARIS SUSCEPTIBILITIES PERFORMED ON  PREVIOUS CULTURE WITHIN THE LAST 5 DAYS. Performed at [facility], [place] [address] [place], [place] [address]    Report Status 02/11/2020 FINAL  Final  Culture, blood (routine x 2)     Status: Abnormal   Collection Time: 02/08/20  6:15 PM   Specimen: BLOOD  Result Value Ref Range Status   Specimen Description BLOOD LEFT ANTECUBITAL  Final   Special Requests   Final    BOTTLES DRAWN AEROBIC AND ANAEROBIC Blood Culture adequate volume   Culture  Setup Time   Final    GRAM POSITIVE COCCI IN CHAINS IN BOTH AEROBIC AND ANAEROBIC BOTTLES CRITICAL RESULT CALLED TO, READ BACK BY AND VERIFIED WITH: Park Breed [id number] FCP Performed at [facility], [place] [address] [place], [place] [address]    Culture STREPTOCOCCUS VESTIBULARIS (A)  Final   Report Status 02/11/2020 FINAL  Final   Organism ID, Bacteria STREPTOCOCCUS VESTIBULARIS  Final      Susceptibility   Streptococcus vestibularis - MIC*    TETRACYCLINE 0.5 SENSITIVE Sensitive     VANCOMYCIN 0.5 SENSITIVE Sensitive     CLINDAMYCIN <=0.25 SENSITIVE Sensitive     PENICILLIN Value in next row Sensitive      SENSITIVE0.12    CEFTRIAXONE Value in next row Sensitive      SENSITIVE0.12    * STREPTOCOCCUS VESTIBULARIS  Blood Culture ID Panel (Reflexed)     Status: Abnormal   Collection Time: 02/08/20  6:15 PM  Result Value Ref Range Status   Enterococcus species NOT DETECTED NOT DETECTED Final   Listeria monocytogenes NOT DETECTED NOT DETECTED Final   Staphylococcus species NOT DETECTED NOT DETECTED Final   Staphylococcus aureus (BCID) NOT DETECTED NOT DETECTED Final   Streptococcus species DETECTED (A) NOT DETECTED Final    Comment: Not Enterococcus species, Streptococcus agalactiae, Streptococcus pyogenes, or Streptococcus pneumoniae. CRITICAL RESULT CALLED TO, READ BACK BY AND VERIFIED WITH: PHARMD JEREMY F. [phone] [id number] FCP    Streptococcus agalactiae NOT DETECTED NOT DETECTED Final   Streptococcus pneumoniae NOT  DETECTED NOT DETECTED Final   Streptococcus pyogenes NOT DETECTED NOT DETECTED Final   Acinetobacter baumannii NOT DETECTED NOT DETECTED Final   Enterobacteriaceae species NOT DETECTED NOT DETECTED Final   Enterobacter cloacae complex NOT DETECTED NOT DETECTED Final   Escherichia coli NOT DETECTED NOT DETECTED Final   Klebsiella oxytoca NOT  DETECTED NOT DETECTED Final   Klebsiella pneumoniae NOT DETECTED NOT DETECTED Final   Proteus species NOT DETECTED NOT DETECTED Final   Serratia marcescens NOT DETECTED NOT DETECTED Final   Haemophilus influenzae NOT DETECTED NOT DETECTED Final   Neisseria meningitidis NOT DETECTED NOT DETECTED Final   Pseudomonas aeruginosa NOT DETECTED NOT DETECTED Final   Candida albicans NOT DETECTED NOT DETECTED Final   Candida glabrata NOT DETECTED NOT DETECTED Final   Candida krusei NOT DETECTED NOT DETECTED Final   Candida parapsilosis NOT DETECTED NOT DETECTED Final   Candida tropicalis NOT DETECTED NOT DETECTED Final    Comment: Performed at [facility], [place] [address] [place], [place], [place] [address]  SARS Coronavirus 2 by RT PCR (hospital order, performed in [facility] hospital lab) Nasopharyngeal Nasopharyngeal Swab     Status: None   Collection Time: 02/08/20  8:48 PM   Specimen: Nasopharyngeal Swab  Result Value Ref Range Status   SARS Coronavirus 2 NEGATIVE NEGATIVE Final    Comment: (NOTE) SARS-CoV-2 target nucleic acids are NOT DETECTED.  The SARS-CoV-2 RNA is generally detectable in upper and lower respiratory specimens during the acute phase of infection. The lowest concentration of SARS-CoV-2 viral copies this assay can detect is 250 copies / mL. A negative result does not preclude SARS-CoV-2 infection and should not be used as the sole basis for treatment or other patient management decisions.  A negative result may occur with improper specimen collection / handling, submission of specimen other than nasopharyngeal swab, presence of viral  mutation(s) within the areas targeted by this assay, and inadequate number of viral copies (<250 copies / mL). A negative result must be combined with clinical observations, patient history, and epidemiological information.  Fact Sheet for Patients:   StrictlyIdeas.no  Fact Sheet for Healthcare Providers: BankingDealers.co.za  This test is not yet approved or  cleared by the [place] FDA and has been authorized for detection and/or diagnosis of SARS-CoV-2 by FDA under an Emergency Use Authorization (EUA).  This EUA will remain in effect (meaning this test can be used) for the duration of the COVID-19 declaration under Section 564(b)(1) of the Act, 21 U.S.C. section 360bbb-3(b)(1), unless the authorization is terminated or revoked sooner.  Performed at [facility], [place] [address], [place], [place] [address]   MRSA PCR Screening     Status: None   Collection Time: 02/09/20 11:32 AM  Result Value Ref Range Status   MRSA by PCR NEGATIVE NEGATIVE Final    Comment:        The GeneXpert MRSA Assay (FDA approved for NASAL specimens only), is one component of a comprehensive MRSA colonization surveillance program. It is not intended to diagnose MRSA infection nor to guide or monitor treatment for MRSA infections. Performed at [facility], [place] [address] [place], [place] [address]   Respiratory Panel by PCR     Status: None   Collection Time: 02/09/20  5:30 PM   Specimen: Nasopharyngeal Swab; Respiratory  Result Value Ref Range Status   Adenovirus NOT DETECTED NOT DETECTED Final   Coronavirus 229E NOT DETECTED NOT DETECTED Final    Comment: (NOTE) The Coronavirus on the Respiratory Panel, DOES NOT test for the novel  Coronavirus (2019 nCoV)    Coronavirus HKU1 NOT DETECTED NOT DETECTED Final   Coronavirus NL63 NOT DETECTED NOT DETECTED Final   Coronavirus OC43 NOT DETECTED NOT DETECTED Final   Metapneumovirus  NOT DETECTED NOT DETECTED Final   Rhinovirus /  Enterovirus NOT DETECTED NOT DETECTED Final   Influenza A NOT DETECTED NOT DETECTED Final   Influenza B NOT DETECTED NOT DETECTED Final   Parainfluenza Virus 1 NOT DETECTED NOT DETECTED Final   Parainfluenza Virus 2 NOT DETECTED NOT DETECTED Final   Parainfluenza Virus 3 NOT DETECTED NOT DETECTED Final   Parainfluenza Virus 4 NOT DETECTED NOT DETECTED Final   Respiratory Syncytial Virus NOT DETECTED NOT DETECTED Final   Bordetella pertussis NOT DETECTED NOT DETECTED Final   Chlamydophila pneumoniae NOT DETECTED NOT DETECTED Final   Mycoplasma pneumoniae NOT DETECTED NOT DETECTED Final    Comment: Performed at [facility], [place] [address] [place], [place] [address]  Culture, blood (routine x 2)     Status: None (Preliminary result)   Collection Time: 02/10/20  9:02 AM   Specimen: BLOOD  Result Value Ref Range Status   Specimen Description BLOOD LEFT ANTECUBITAL  Final   Special Requests   Final    BOTTLES DRAWN AEROBIC AND ANAEROBIC Blood Culture adequate volume   Culture   Final    NO GROWTH 3 DAYS Performed at [facility], [place] [address] [place], [place] [address]    Report Status PENDING  Incomplete  Culture, blood (routine x 2)     Status: None (Preliminary result)   Collection Time: 02/10/20  9:05 AM   Specimen: BLOOD LEFT HAND  Result Value Ref Range Status   Specimen Description BLOOD LEFT HAND  Final   Special Requests   Final    BOTTLES DRAWN AEROBIC AND ANAEROBIC Blood Culture adequate volume   Culture   Final    NO GROWTH 3 DAYS Performed at [facility], [place] [address] [place], [place] [address]    Report Status PENDING  Incomplete    Studies/Results: ECHOCARDIOGRAM COMPLETE  Result Date: 02/11/2020    ECHOCARDIOGRAM REPORT   Patient Name:   [name] Date of Exam: 02/11/2020 Medical Rec #:  [id number]         Height:       63.0 in Accession #:    [id number]        Weight:       121.3 lb  Date of Birth:  May 07, 1976        BSA:          1.563 m Patient Age:    44 years          BP:           165/68 mmHg Patient Gender: F                 HR:           76 bpm. Exam Location:  Inpatient Procedure: 2D Echo Indications:    Bacteremia 790.7 / R78.81  History:        Patient has prior history of Echocardiogram examinations, most                 recent 10/21/2019. Risk Factors:Diabetes, Dyslipidemia, Non-Smoker                 and Hypertension. ESRD, Pneumonia.  Sonographer:    Leavy Cella Referring Phys: [place]  1. Left ventricular ejection fraction, by estimation, is 55 to 60%. The left ventricle has normal function. The left ventricle has no regional wall motion abnormalities. There is mild concentric left ventricular hypertrophy. Left ventricular diastolic parameters are consistent with Grade II diastolic dysfunction (pseudonormalization).  2. Right ventricular systolic function is normal. The right ventricular size is mildly enlarged. There is moderately elevated pulmonary artery systolic pressure.  3. Left atrial size was moderately dilated.  4. Right atrial size was moderately dilated.  5. The mitral valve is normal in structure. Trivial mitral valve regurgitation.  6. Tricuspid valve regurgitation is severe.  7. The aortic valve is tricuspid. Aortic valve regurgitation is not visualized. Mild aortic valve sclerosis is present, with no evidence of aortic valve stenosis.  8. The inferior vena cava is normal in size with <50% respiratory variability, suggesting right atrial pressure of 8 mmHg. Comparison(s): No significant change from prior study. Conclusion(s)/Recommendation(s): No evidence of valvular vegetations on this transthoracic echocardiogram. Would recommend a transesophageal echocardiogram to exclude infective endocarditis if clinically indicated. FINDINGS  Left Ventricle: Left ventricular ejection fraction, by estimation, is 55 to 60%. The left ventricle has  normal function. The left ventricle has no regional wall motion abnormalities. The left ventricular internal cavity size was normal in size. There is  mild concentric left ventricular hypertrophy. Left ventricular diastolic parameters are consistent with Grade II diastolic dysfunction (pseudonormalization). Right Ventricle: The right ventricular size is mildly enlarged. Right vetricular wall thickness was not assessed. Right ventricular systolic function is normal. There is moderately elevated pulmonary artery systolic pressure. The tricuspid regurgitant velocity is 3.23 m/s, and with an assumed right atrial pressure of 8 mmHg, the estimated right ventricular systolic pressure is 12.8 mmHg. Left Atrium: Left atrial size was moderately dilated. Right Atrium: Right atrial size was moderately dilated. Pericardium: A small pericardial effusion is present. Mitral Valve: The mitral valve is normal in structure. Trivial mitral valve regurgitation. Tricuspid Valve: The tricuspid valve is normal in structure. Tricuspid valve regurgitation is severe. Aortic Valve: The aortic valve is tricuspid. Aortic valve regurgitation is not visualized. Mild aortic valve sclerosis is present, with no evidence of aortic valve stenosis. There is mild calcification of the aortic valve. Pulmonic Valve: The pulmonic valve was grossly normal. Pulmonic valve regurgitation is mild. Aorta: The aortic root, ascending aorta and aortic arch are all structurally normal, with no evidence of dilitation or obstruction. Venous: The inferior vena cava is normal in size with less than 50% respiratory variability, suggesting right atrial pressure of 8 mmHg. IAS/Shunts: The atrial septum is grossly normal.  LEFT VENTRICLE PLAX 2D LVIDd:         4.30 cm  Diastology LVIDs:         2.90 cm  LV e' lateral:   8.70 cm/s LV PW:         1.20 cm  LV E/e' lateral: 13.2 LV IVS:        1.00 cm  LV e' medial:    7.83 cm/s LVOT diam:     1.70 cm  LV E/e' medial:  14.7 LVOT  Area:     2.27 cm  RIGHT VENTRICLE RV S prime:     12.20 cm/s TAPSE (M-mode): 1.7 cm LEFT ATRIUM           Index       RIGHT ATRIUM           Index LA diam:      4.20 cm 2.69 cm/m  RA Area:     17.70 cm LA Vol (A2C): 37.1 ml 23.73 ml/m RA Volume:   63.20 ml  40.43 ml/m LA Vol (A4C): 58.4 ml 37.36 ml/m   AORTA Ao Root diam: 2.50 cm MITRAL VALVE  TRICUSPID VALVE MV Area (PHT): 2.99 cm     TR Peak grad:   41.7 mmHg MV Decel Time: 254 msec     TR Vmax:        323.00 cm/s MV E velocity: 115.00 cm/s MV A velocity: 107.00 cm/s  SHUNTS MV E/A ratio:  1.07         Systemic Diam: 1.70 cm Buford Dresser MD Electronically signed by Buford Dresser MD Signature Date/Time: 02/11/2020/4:13:50 PM    Final       Assessment/Plan:  INTERVAL HISTORY:  For TEE today   Principal Problem:   Streptococcal bacteremia Active Problems:   Cerebral infarction ([place])   Essential hypertension, benign   HLD (hyperlipidemia)   Pneumonia   ESRD on hemodialysis ([place])   Diabetes mellitus type I ([place])   Hypoglycemia   Hypothermia    [name] is a 44 y.o. female with end-stage renal disease on hemodialysis admitted with fevers, found to have a right middle lobe pneumonia, then found to have Streptococcus vestibularis bacteremia.  #1 Streptococcus vestibularis bacteremia: I am concerned for endocarditis.  TEE today and I am ordering Panorex  Continue ceftriaxone but will soon change to ancef for ease of dosing with HD   LOS: 4 days   Alcide Evener 02/13/2020, 11:13 AM

## 2020-02-13 NOTE — Progress Notes (Signed)
PROGRESS NOTE        PATIENT DETAILS Name: [name] Age: 44 y.o. Sex: female Date of Birth: 1975/10/17 Admit Date: 02/08/2020 Admitting Physician Antonieta Pert, MD JHE:[id number], Iona Beard, MD  Brief Narrative: Patient is a 44 y.o. female with history of DM-1 on insulin pump, ESRD on HD TTS, HTN-who presented with altered mental status secondary to hypoglycemia. Upon further evaluation in the emergency room-chest x-ray was consistent with pneumonia-subsequent blood cultures positive  streptococcal vestibularis. See Below for further details.  Note-not vaccinated against Covid-COVID-19 PCR negative on admission  Significant events: 7/21>> admit for unresponsiveness-hypoglycemia/hypothermia. Chest x-ray positive for pneumonia, blood cultures positive for streptococcal vestibularis bacteremia.  Significant studies: 7/24>>Echo: EF 18-56%, grade 2 diastolic dysfunction 3/14>> chest x-ray:. Progressive infiltrate right mid lung consistent with progression of pneumonia. 7/21>> chest x-ray:  airspace disease right midlung-consistent with bronchopneumonia  Antimicrobial therapy: Rocephin: 7/21>> Zithromax: 7/21 x 1  Microbiology data: 7/23>> blood culture: neg so far 7/21>> blood culture: Strep Vestibularis 7/21>>SARS Coronavirus 2: Negative  Procedures : None  Consults: Nephrology ID  DVT Prophylaxis : heparin injection 5,000 Units Start: 02/08/20 2315   Subjective: Episode of hypoglycemia yesterday afternoon-none since then.  For TEE later today.  No complaints this morning.  Assessment/Plan: Acute metabolic encephalopathy secondary to hypoglycemia: Resolved-she is completely awake and alert.   Hypoglycemia-history of DM-1 on insulin pump (A1c 6.2 on 02/08/2020): Claims she woke up in an ambulance on the day of admission-and was told she was hypoglycemic.  She is maintained on an insulin pump-Per patient-she ran out of her monitoring device a few  days back that notifies her of episodes of hypoglycemia.  Given streptococcal bacteremia-suspect she had poor oral intake that likely contributed to hypoglycemic episode on presentation.  Appears to have very brittle diabetes-with both hypoglycemic and hyperglycemic episodes on the same day.  We will continue with Lantus 6 units-and SSI-once she is done with all the procedures-we will discuss with diabetic coordinator and see if we can just put her back on her insulin pump and monitor for 1 day before consideration of discharge.  Streptococcal vestibularis bacteremia: Initial blood cultures on admission positive for Streptococcus vestibularis-repeat blood culture on 7/23 NEG so far.  HD site without any obvious infection.  Chest x-ray confirms PNA as well-not sure if this is the foci for infection..  Remains on Rocephin-transthoracic echocardiogram negative for vegetations-scheduled for TEE today.  Appreciate ID input.   Community-acquired pneumonia: Acknowledges cough/URI symptoms for a week or so prior to this hospital stay-chest x-ray confirms PNA (repeated after HD)-on Rocephin.  Clinically improved.  No fever or shortness of breath.  Vomiting: Resolved.  Likely secondary to bacteremia-abdominal exam appears benign  ESRD on HD TTS: Nephrology consulted and directing care.  HTN: BP relatively stable-continue labetalol, amlodipine and losartan.  Follow and adjust accordingly.    Anemia likely secondary to chronic disease: Hemoglobin stable-defer IV iron/Aranesp to nephrology.  Diet: Diet Order            Diet NPO time specified  Diet effective midnight                  Code Status: Full code   Family Communication: Aunt over the phone on 7/24  Disposition Plan: Status is: Inpatient   Inpatient level of care appropriate due to severity of illness  Dispo: The patient  is from: Home              Anticipated d/c is to: Home              Anticipated d/c date is: > 3 days               Patient currently is not medically stable to d/c.   Barriers to Discharge: Streptococcal bacteremia requiring IV antibiotics-requires TEE-see above.  Antimicrobial agents: Anti-infectives (From admission, onward)   Start     Dose/Rate Route Frequency Ordered Stop   02/14/20 1800  ceFAZolin (ANCEF) IVPB 2g/100 mL premix     Discontinue     2 g 200 mL/hr over 30 Minutes Intravenous Every T-Th-Sa (1800) 02/13/20 1104     02/09/20 1800  cefTRIAXone (ROCEPHIN) 1 g in sodium chloride 0.9 % 100 mL IVPB  Status:  Discontinued        1 g 200 mL/hr over 30 Minutes Intravenous Every 24 hours 02/08/20 2311 02/09/20 1137   02/09/20 1800  doxycycline (VIBRAMYCIN) 100 mg in sodium chloride 0.9 % 250 mL IVPB  Status:  Discontinued        100 mg 125 mL/hr over 120 Minutes Intravenous Every 12 hours 02/08/20 2311 02/09/20 1137   02/09/20 1300  cefTRIAXone (ROCEPHIN) 2 g in sodium chloride 0.9 % 100 mL IVPB     Discontinue     2 g 200 mL/hr over 30 Minutes Intravenous Every 24 hours 02/09/20 1137 02/14/20 1259   02/08/20 2030  cefTRIAXone (ROCEPHIN) 1 g in sodium chloride 0.9 % 100 mL IVPB        1 g 200 mL/hr over 30 Minutes Intravenous  Once 02/08/20 2027 02/08/20 2205   02/08/20 2030  azithromycin (ZITHROMAX) 500 mg in sodium chloride 0.9 % 250 mL IVPB        500 mg 250 mL/hr over 60 Minutes Intravenous  Once 02/08/20 2027 02/08/20 2346       Time spent: 25 minutes-Greater than 50% of this time was spent in counseling, explanation of diagnosis, planning of further management, and coordination of care.  MEDICATIONS: Scheduled Meds: . amLODipine  10 mg Oral QHS  . aspirin EC  81 mg Oral QHS  . calcium acetate  667 mg Oral TID WC   And  . calcium acetate  667 mg Oral With snacks  . Chlorhexidine Gluconate Cloth  6 each Topical Q0600  . [START ON 02/14/2020] darbepoetin (ARANESP) injection - DIALYSIS  100 mcg Intravenous Q Tue-HD  . doxercalciferol  1 mcg Intravenous Q T,Th,Sa-HD  . heparin   5,000 Units Subcutaneous Q8H  . insulin aspart  0-6 Units Subcutaneous TID WC  . insulin aspart  2 Units Subcutaneous TID WC  . insulin glargine  6 Units Subcutaneous Daily  . labetalol  300 mg Oral QPM  . losartan  25 mg Oral BID  . multivitamin with minerals  1 tablet Oral q AM  . rosuvastatin  10 mg Oral QHS   Continuous Infusions: . [START ON 02/14/2020]  ceFAZolin (ANCEF) IV    . cefTRIAXone (ROCEPHIN)  IV 2 g (02/12/20 1308)   PRN Meds:.acetaminophen, loratadine, ondansetron (ZOFRAN) IV   PHYSICAL EXAM: Vital signs: Vitals:   02/12/20 1626 02/12/20 2111 02/13/20 0300 02/13/20 0747  BP: (!) 176/77 (!) 158/77  (!) 157/65  Pulse: 73 69  74  Resp:  16  16  Temp:  97.8 F (36.6 C)  98.1 F (36.7 C)  TempSrc:  Oral  SpO2: 97% 95%  93%  Weight:   60.1 kg   Height:       Filed Weights   02/11/20 1108 02/12/20 0500 02/13/20 0300  Weight: 55 kg 57.7 kg 60.1 kg   Body mass index is 23.47 kg/m.  Gen Exam:Alert awake-not in any distress HEENT:atraumatic, normocephalic Chest: B/L clear to auscultation anteriorly CVS:S1S2 regular Abdomen:soft non tender, non distended Extremities:no edema Neurology: Non focal Skin: no rash  I have personally reviewed following labs and imaging studies  LABORATORY DATA: CBC: Recent Labs  Lab 02/08/20 1715 02/08/20 2355 02/09/20 0405 02/10/20 0907 02/11/20 0606  WBC 8.6 9.4 9.7 6.0 5.8  NEUTROABS 7.6  --   --  4.4  --   HGB 10.1* 10.1* 9.5* 10.8* 10.0*  HCT 34.0* 33.7* 31.4* 35.5* 33.3*  MCV 84.4 83.6 85.1 83.3 85.2  PLT 213 251 249 227 025    Basic Metabolic Panel: Recent Labs  Lab 02/08/20 1715 02/08/20 2355 02/09/20 0405 02/10/20 0907 02/11/20 0326  NA 136  --  136 138 136  K 3.9  --  3.6 3.4* 3.6  CL 96*  --  95* 96* 95*  CO2 27  --  22 29 28   GLUCOSE 190*  --  179* 65* 171*  BUN 16  --  23* 10 19  CREATININE 4.61* 5.08* 5.42* 3.44* 4.59*  CALCIUM 9.0  --  8.8* 9.3 9.1  PHOS  --   --   --  3.7 3.9     GFR: Estimated Creatinine Clearance: 13.3 mL/min (A) (by C-G formula based on SCr of 4.59 mg/dL (H)).  Liver Function Tests: Recent Labs  Lab 02/10/20 0907 02/11/20 0326  ALBUMIN 3.1* 2.7*   No results for input(s): LIPASE, AMYLASE in the last 168 hours. No results for input(s): AMMONIA in the last 168 hours.  Coagulation Profile: No results for input(s): INR, PROTIME in the last 168 hours.  Cardiac Enzymes: No results for input(s): CKTOTAL, CKMB, CKMBINDEX, TROPONINI in the last 168 hours.  BNP (last 3 results) No results for input(s): PROBNP in the last 8760 hours.  Lipid Profile: No results for input(s): CHOL, HDL, LDLCALC, TRIG, CHOLHDL, LDLDIRECT in the last 72 hours.  Thyroid Function Tests: No results for input(s): TSH, T4TOTAL, FREET4, T3FREE, THYROIDAB in the last 72 hours.  Anemia Panel: No results for input(s): VITAMINB12, FOLATE, FERRITIN, TIBC, IRON, RETICCTPCT in the last 72 hours.  Urine analysis:    Component Value Date/Time   COLORURINE YELLOW 03/29/2014 [place] 03/29/2014 1734   LABSPEC 1.015 03/29/2014 1734   PHURINE 8.0 03/29/2014 1734   GLUCOSEU >1000 (A) 03/29/2014 1734   HGBUR SMALL (A) 03/29/2014 1734   BILIRUBINUR NEGATIVE 03/29/2014 1734   KETONESUR NEGATIVE 03/29/2014 1734   PROTEINUR 100 (A) 03/29/2014 1734   UROBILINOGEN 0.2 03/29/2014 1734   NITRITE NEGATIVE 03/29/2014 1734   LEUKOCYTESUR TRACE (A) 03/29/2014 1734    Sepsis Labs: Lactic Acid, Venous    Component Value Date/Time   LATICACIDVEN 1.5 02/09/2020 0959    MICROBIOLOGY: Recent Results (from the past 240 hour(s))  Culture, blood (routine x 2)     Status: Abnormal   Collection Time: 02/08/20  6:04 PM   Specimen: BLOOD  Result Value Ref Range Status   Specimen Description BLOOD SITE NOT SPECIFIED  Final   Special Requests   Final    BOTTLES DRAWN AEROBIC AND ANAEROBIC Blood Culture results may not be optimal due to an inadequate volume of blood  received in culture bottles   Culture  Setup Time   Final    GRAM POSITIVE COCCI IN CHAINS IN BOTH AEROBIC AND ANAEROBIC BOTTLES CRITICAL VALUE NOTED.  VALUE IS CONSISTENT WITH PREVIOUSLY REPORTED AND CALLED VALUE.    Culture (A)  Final    STREPTOCOCCUS VESTIBULARIS SUSCEPTIBILITIES PERFORMED ON PREVIOUS CULTURE WITHIN THE LAST 5 DAYS. Performed at [facility], [place] [address], [place], [place] [address]    Report Status 02/11/2020 FINAL  Final  Culture, blood (routine x 2)     Status: Abnormal   Collection Time: 02/08/20  6:15 PM   Specimen: BLOOD  Result Value Ref Range Status   Specimen Description BLOOD LEFT ANTECUBITAL  Final   Special Requests   Final    BOTTLES DRAWN AEROBIC AND ANAEROBIC Blood Culture adequate volume   Culture  Setup Time   Final    GRAM POSITIVE COCCI IN CHAINS IN BOTH AEROBIC AND ANAEROBIC BOTTLES CRITICAL RESULT CALLED TO, READ BACK BY AND VERIFIED WITH: Park Breed [id number] FCP Performed at [facility], [place] [address], [place], [place] [address]    Culture STREPTOCOCCUS VESTIBULARIS (A)  Final   Report Status 02/11/2020 FINAL  Final   Organism ID, Bacteria STREPTOCOCCUS VESTIBULARIS  Final      Susceptibility   Streptococcus vestibularis - MIC*    TETRACYCLINE 0.5 SENSITIVE Sensitive     VANCOMYCIN 0.5 SENSITIVE Sensitive     CLINDAMYCIN <=0.25 SENSITIVE Sensitive     PENICILLIN Value in next row Sensitive      SENSITIVE0.12    CEFTRIAXONE Value in next row Sensitive      SENSITIVE0.12    * STREPTOCOCCUS VESTIBULARIS  Blood Culture ID Panel (Reflexed)     Status: Abnormal   Collection Time: 02/08/20  6:15 PM  Result Value Ref Range Status   Enterococcus species NOT DETECTED NOT DETECTED Final   Listeria monocytogenes NOT DETECTED NOT DETECTED Final   Staphylococcus species NOT DETECTED NOT DETECTED Final   Staphylococcus aureus (BCID) NOT DETECTED NOT DETECTED Final   Streptococcus species DETECTED (A) NOT DETECTED Final     Comment: Not Enterococcus species, Streptococcus agalactiae, Streptococcus pyogenes, or Streptococcus pneumoniae. CRITICAL RESULT CALLED TO, READ BACK BY AND VERIFIED WITH: PHARMD JEREMY F. [phone] [id number] FCP    Streptococcus agalactiae NOT DETECTED NOT DETECTED Final   Streptococcus pneumoniae NOT DETECTED NOT DETECTED Final   Streptococcus pyogenes NOT DETECTED NOT DETECTED Final   Acinetobacter baumannii NOT DETECTED NOT DETECTED Final   Enterobacteriaceae species NOT DETECTED NOT DETECTED Final   Enterobacter cloacae complex NOT DETECTED NOT DETECTED Final   Escherichia coli NOT DETECTED NOT DETECTED Final   Klebsiella oxytoca NOT DETECTED NOT DETECTED Final   Klebsiella pneumoniae NOT DETECTED NOT DETECTED Final   Proteus species NOT DETECTED NOT DETECTED Final   Serratia marcescens NOT DETECTED NOT DETECTED Final   Haemophilus influenzae NOT DETECTED NOT DETECTED Final   Neisseria meningitidis NOT DETECTED NOT DETECTED Final   Pseudomonas aeruginosa NOT DETECTED NOT DETECTED Final   Candida albicans NOT DETECTED NOT DETECTED Final   Candida glabrata NOT DETECTED NOT DETECTED Final   Candida krusei NOT DETECTED NOT DETECTED Final   Candida parapsilosis NOT DETECTED NOT DETECTED Final   Candida tropicalis NOT DETECTED NOT DETECTED Final    Comment: Performed at [facility], [place] [address] [place], [place] [address]  SARS Coronavirus 2 by RT PCR (hospital order, performed in [facility] hospital lab)  Nasopharyngeal Nasopharyngeal Swab     Status: None   Collection Time: 02/08/20  8:48 PM   Specimen: Nasopharyngeal Swab  Result Value Ref Range Status   SARS Coronavirus 2 NEGATIVE NEGATIVE Final    Comment: (NOTE) SARS-CoV-2 target nucleic acids are NOT DETECTED.  The SARS-CoV-2 RNA is generally detectable in upper and lower respiratory specimens during the acute phase of infection. The lowest concentration of SARS-CoV-2 viral copies this assay can detect is 250 copies /  mL. A negative result does not preclude SARS-CoV-2 infection and should not be used as the sole basis for treatment or other patient management decisions.  A negative result may occur with improper specimen collection / handling, submission of specimen other than nasopharyngeal swab, presence of viral mutation(s) within the areas targeted by this assay, and inadequate number of viral copies (<250 copies / mL). A negative result must be combined with clinical observations, patient history, and epidemiological information.  Fact Sheet for Patients:   StrictlyIdeas.no  Fact Sheet for Healthcare Providers: BankingDealers.co.za  This test is not yet approved or  cleared by the [place] FDA and has been authorized for detection and/or diagnosis of SARS-CoV-2 by FDA under an Emergency Use Authorization (EUA).  This EUA will remain in effect (meaning this test can be used) for the duration of the COVID-19 declaration under Section 564(b)(1) of the Act, 21 U.S.C. section 360bbb-3(b)(1), unless the authorization is terminated or revoked sooner.  Performed at [facility], [place] [address], [place], [place] [address]   MRSA PCR Screening     Status: None   Collection Time: 02/09/20 11:32 AM  Result Value Ref Range Status   MRSA by PCR NEGATIVE NEGATIVE Final    Comment:        The GeneXpert MRSA Assay (FDA approved for NASAL specimens only), is one component of a comprehensive MRSA colonization surveillance program. It is not intended to diagnose MRSA infection nor to guide or monitor treatment for MRSA infections. Performed at [facility], [place] [address], [place], [place] [address]   Respiratory Panel by PCR     Status: None   Collection Time: 02/09/20  5:30 PM   Specimen: Nasopharyngeal Swab; Respiratory  Result Value Ref Range Status   Adenovirus NOT DETECTED NOT DETECTED Final   Coronavirus 229E NOT DETECTED NOT  DETECTED Final    Comment: (NOTE) The Coronavirus on the Respiratory Panel, DOES NOT test for the novel  Coronavirus (2019 nCoV)    Coronavirus HKU1 NOT DETECTED NOT DETECTED Final   Coronavirus NL63 NOT DETECTED NOT DETECTED Final   Coronavirus OC43 NOT DETECTED NOT DETECTED Final   Metapneumovirus NOT DETECTED NOT DETECTED Final   Rhinovirus / Enterovirus NOT DETECTED NOT DETECTED Final   Influenza A NOT DETECTED NOT DETECTED Final   Influenza B NOT DETECTED NOT DETECTED Final   Parainfluenza Virus 1 NOT DETECTED NOT DETECTED Final   Parainfluenza Virus 2 NOT DETECTED NOT DETECTED Final   Parainfluenza Virus 3 NOT DETECTED NOT DETECTED Final   Parainfluenza Virus 4 NOT DETECTED NOT DETECTED Final   Respiratory Syncytial Virus NOT DETECTED NOT DETECTED Final   Bordetella pertussis NOT DETECTED NOT DETECTED Final   Chlamydophila pneumoniae NOT DETECTED NOT DETECTED Final   Mycoplasma pneumoniae NOT DETECTED NOT DETECTED Final    Comment: Performed at [facility], [place], [place], [place] [address]  Culture, blood (routine x 2)     Status: None (Preliminary result)  Collection Time: 02/10/20  9:02 AM   Specimen: BLOOD  Result Value Ref Range Status   Specimen Description BLOOD LEFT ANTECUBITAL  Final   Special Requests   Final    BOTTLES DRAWN AEROBIC AND ANAEROBIC Blood Culture adequate volume   Culture   Final    NO GROWTH 3 DAYS Performed at [facility], [address], [place], [place] [address]    Report Status PENDING  Incomplete  Culture, blood (routine x 2)     Status: None (Preliminary result)   Collection Time: 02/10/20  9:05 AM   Specimen: BLOOD LEFT HAND  Result Value Ref Range Status   Specimen Description BLOOD LEFT HAND  Final   Special Requests   Final    BOTTLES DRAWN AEROBIC AND ANAEROBIC Blood Culture adequate volume   Culture   Final    NO GROWTH 3 DAYS Performed at [facility], [place] [address], [place], [place] [address]     Report Status PENDING  Incomplete    RADIOLOGY STUDIES/RESULTS: ECHOCARDIOGRAM COMPLETE  Result Date: 02/11/2020    ECHOCARDIOGRAM REPORT   Patient Name:   [name] Date of Exam: 02/11/2020 Medical Rec #:  [id number]         Height:       63.0 in Accession #:    [id number]        Weight:       121.3 lb Date of Birth:  [birth year]        BSA:          1.563 m Patient Age:    30 years          BP:           165/68 mmHg Patient Gender: F                 HR:           76 bpm. Exam Location:  Inpatient Procedure: 2D Echo Indications:    Bacteremia 790.7 / R78.81  History:        Patient has prior history of Echocardiogram examinations, most                 recent 10/21/2019. Risk Factors:Diabetes, Dyslipidemia, Non-Smoker                 and Hypertension. ESRD, Pneumonia.  Sonographer:    Leavy Cella Referring Phys: [place]  1. Left ventricular ejection fraction, by estimation, is 55 to 60%. The left ventricle has normal function. The left ventricle has no regional wall motion abnormalities. There is mild concentric left ventricular hypertrophy. Left ventricular diastolic parameters are consistent with Grade II diastolic dysfunction (pseudonormalization).  2. Right ventricular systolic function is normal. The right ventricular size is mildly enlarged. There is moderately elevated pulmonary artery systolic pressure.  3. Left atrial size was moderately dilated.  4. Right atrial size was moderately dilated.  5. The mitral valve is normal in structure. Trivial mitral valve regurgitation.  6. Tricuspid valve regurgitation is severe.  7. The aortic valve is tricuspid. Aortic valve regurgitation is not visualized. Mild aortic valve sclerosis is present, with no evidence of aortic valve stenosis.  8. The inferior vena cava is normal in size with <50% respiratory variability, suggesting right atrial pressure of 8 mmHg. Comparison(s): No significant change from prior study.  Conclusion(s)/Recommendation(s): No evidence of valvular vegetations on this transthoracic echocardiogram. Would recommend a transesophageal echocardiogram to exclude infective endocarditis  if clinically indicated. FINDINGS  Left Ventricle: Left ventricular ejection fraction, by estimation, is 55 to 60%. The left ventricle has normal function. The left ventricle has no regional wall motion abnormalities. The left ventricular internal cavity size was normal in size. There is  mild concentric left ventricular hypertrophy. Left ventricular diastolic parameters are consistent with Grade II diastolic dysfunction (pseudonormalization). Right Ventricle: The right ventricular size is mildly enlarged. Right vetricular wall thickness was not assessed. Right ventricular systolic function is normal. There is moderately elevated pulmonary artery systolic pressure. The tricuspid regurgitant velocity is 3.23 m/s, and with an assumed right atrial pressure of 8 mmHg, the estimated right ventricular systolic pressure is 83.3 mmHg. Left Atrium: Left atrial size was moderately dilated. Right Atrium: Right atrial size was moderately dilated. Pericardium: A small pericardial effusion is present. Mitral Valve: The mitral valve is normal in structure. Trivial mitral valve regurgitation. Tricuspid Valve: The tricuspid valve is normal in structure. Tricuspid valve regurgitation is severe. Aortic Valve: The aortic valve is tricuspid. Aortic valve regurgitation is not visualized. Mild aortic valve sclerosis is present, with no evidence of aortic valve stenosis. There is mild calcification of the aortic valve. Pulmonic Valve: The pulmonic valve was grossly normal. Pulmonic valve regurgitation is mild. Aorta: The aortic root, ascending aorta and aortic arch are all structurally normal, with no evidence of dilitation or obstruction. Venous: The inferior vena cava is normal in size with less than 50% respiratory variability, suggesting right  atrial pressure of 8 mmHg. IAS/Shunts: The atrial septum is grossly normal.  LEFT VENTRICLE PLAX 2D LVIDd:         4.30 cm  Diastology LVIDs:         2.90 cm  LV e' lateral:   8.70 cm/s LV PW:         1.20 cm  LV E/e' lateral: 13.2 LV IVS:        1.00 cm  LV e' medial:    7.83 cm/s LVOT diam:     1.70 cm  LV E/e' medial:  14.7 LVOT Area:     2.27 cm  RIGHT VENTRICLE RV S prime:     12.20 cm/s TAPSE (M-mode): 1.7 cm LEFT ATRIUM           Index       RIGHT ATRIUM           Index LA diam:      4.20 cm 2.69 cm/m  RA Area:     17.70 cm LA Vol (A2C): 37.1 ml 23.73 ml/m RA Volume:   63.20 ml  40.43 ml/m LA Vol (A4C): 58.4 ml 37.36 ml/m   AORTA Ao Root diam: 2.50 cm MITRAL VALVE                TRICUSPID VALVE MV Area (PHT): 2.99 cm     TR Peak grad:   41.7 mmHg MV Decel Time: 254 msec     TR Vmax:        323.00 cm/s MV E velocity: 115.00 cm/s MV A velocity: 107.00 cm/s  SHUNTS MV E/A ratio:  1.07         Systemic Diam: 1.70 cm Buford Dresser MD Electronically signed by Buford Dresser MD Signature Date/Time: 02/11/2020/4:13:50 PM    Final      LOS: 4 days   Oren Binet, MD  [facility]    To contact the attending provider between 7A-7P or the covering provider during after hours 7P-7A, please log into the web  site [email or web address] and access using universal [place] password for that web site. If you do not have the password, please call the hospital operator.  02/13/2020, 11:10 AM

## 2020-02-13 NOTE — Interval H&P Note (Signed)
History and Physical Interval Note:  02/13/2020 1:31 PM  [name]  has presented today for surgery, with the diagnosis of bacteremia.  The various methods of treatment have been discussed with the patient and family. After consideration of risks, benefits and other options for treatment, the patient has consented to  Procedure(s): TRANSESOPHAGEAL ECHOCARDIOGRAM (TEE) (N/A) as a surgical intervention.  The patient's history has been reviewed, patient examined, no change in status, stable for surgery.  I have reviewed the patient's chart and labs.  Questions were answered to the patient's satisfaction.     Jenkins Rouge

## 2020-02-13 NOTE — Progress Notes (Signed)
Patient ID: [name], female   DOB: 1975/11/17, 44 y.o.   MRN: [id number] [name] Progress Note   Assessment/ Plan:   1.  Right lower lobe pneumonia with Streptococcus vestibularis bacteremia: On ceftriaxone based on culture and sensitivities and awaiting additional evaluation for endocarditis with echocardiogram and Panorex for other source identification of bacteremia.  She is afebrile and hemodynamically stable. 2. ESRD: She is usually on a TTS dialysis schedule and will undergo hemodialysis tomorrow; she does not have any acute dialysis needs at this time. 3. Anemia: Last hemoglobin and hematocrit from 2 days ago within acceptable range.  She will get ESA with hemodialysis tomorrow.  Intravenous iron on hold with bacteremia. 4. CKD-MBD: Calcium and phosphorus levels currently at goal on calcium acetate for phosphorus binding.  Continue Hectorol for PTH suppression. 5. Nutrition: Continue renal diet with renal multivitamin supplementation. 6. Hypertension: Blood pressures mildly elevated, continue to monitor on current antihypertensive therapy with hemodialysis for ultrafiltration.  Subjective:   Denies any complaints, does not have any chest pain or shortness of breath.  No pain or redness around right upper arm graft.   Objective:   BP (!) 157/65 (BP Location: Left Leg)   Pulse 74   Temp 98.1 F (36.7 C)   Resp 16   Ht 5\' 3"  (1.6 m)   Wt 60.1 kg   SpO2 93%   BMI 23.47 kg/m   Physical Exam: Gen: Comfortably ambulating around room. CVS: Pulse regular rhythm, normal rate, S1 and S2 normal without murmur Resp: Clear to auscultation bilaterally, no rales/rhonchi Abd: Soft, flat, nontender Ext: No lower extremity edema.  Mildly pulsatile upper arm arteriovenous graft without erythema or associated fluid collection.  Labs: BMET Recent Labs  Lab 02/08/20 1715 02/08/20 2355 02/09/20 0405 02/10/20 0907 02/11/20 0326  NA 136  --  136 138 136  K 3.9  --   3.6 3.4* 3.6  CL 96*  --  95* 96* 95*  CO2 27  --  22 29 28   GLUCOSE 190*  --  179* 65* 171*  BUN 16  --  23* 10 19  CREATININE 4.61* 5.08* 5.42* 3.44* 4.59*  CALCIUM 9.0  --  8.8* 9.3 9.1  PHOS  --   --   --  3.7 3.9   CBC Recent Labs  Lab 02/08/20 1715 02/08/20 1715 02/08/20 2355 02/09/20 0405 02/10/20 0907 02/11/20 0606  WBC 8.6   < > 9.4 9.7 6.0 5.8  NEUTROABS 7.6  --   --   --  4.4  --   HGB 10.1*   < > 10.1* 9.5* 10.8* 10.0*  HCT 34.0*   < > 33.7* 31.4* 35.5* 33.3*  MCV 84.4   < > 83.6 85.1 83.3 85.2  PLT 213   < > 251 249 227 196   < > = values in this interval not displayed.     Medications:    . amLODipine  10 mg Oral QHS  . aspirin EC  81 mg Oral QHS  . calcium acetate  667 mg Oral TID WC   And  . calcium acetate  667 mg Oral With snacks  . Chlorhexidine Gluconate Cloth  6 each Topical Q0600  . [START ON 02/14/2020] darbepoetin (ARANESP) injection - DIALYSIS  100 mcg Intravenous Q Tue-HD  . doxercalciferol  1 mcg Intravenous Q T,Th,Sa-HD  . heparin  5,000 Units Subcutaneous Q8H  . insulin aspart  0-6 Units Subcutaneous TID WC  . insulin aspart  2 Units Subcutaneous TID WC  . insulin glargine  6 Units Subcutaneous Daily  . labetalol  300 mg Oral QPM  . losartan  25 mg Oral BID  . multivitamin with minerals  1 tablet Oral q AM  . potassium chloride  20 mEq Oral Once  . potassium chloride  20 mEq Oral Once  . rosuvastatin  10 mg Oral QHS   Elmarie Shiley, MD 02/13/2020, 9:06 AM

## 2020-02-13 NOTE — Anesthesia Postprocedure Evaluation (Signed)
Anesthesia Post Note  Patient: LEASA KINCANNON  Procedure(s) Performed: TRANSESOPHAGEAL ECHOCARDIOGRAM (TEE) (N/A )     Patient location during evaluation: Endoscopy Anesthesia Type: MAC Level of consciousness: awake and alert Pain management: pain level controlled Vital Signs Assessment: post-procedure vital signs reviewed and stable Respiratory status: spontaneous breathing, nonlabored ventilation and respiratory function stable Cardiovascular status: stable and blood pressure returned to baseline Postop Assessment: no apparent nausea or vomiting Anesthetic complications: no   No complications documented.  Last Vitals:  Vitals:   02/13/20 1435 02/13/20 1445  BP: (!) 160/73 (!) 169/80  Pulse: 68 66  Resp: 22 22  Temp:    SpO2: 98% 100%    Last Pain:  Vitals:   02/13/20 1445  TempSrc:   PainSc: 0-No pain                 Aurore Redinger,W. EDMOND

## 2020-02-13 NOTE — Anesthesia Procedure Notes (Signed)
Procedure Name: MAC Date/Time: 02/13/2020 2:33 PM Performed by: Lavell Luster, CRNA Pre-anesthesia Checklist: Patient identified, Emergency Drugs available, Suction available, Patient being monitored and Timeout performed Patient Re-evaluated:Patient Re-evaluated prior to induction Oxygen Delivery Method: Simple face mask and Nasal cannula Preoxygenation: Pre-oxygenation with 100% oxygen Induction Type: IV induction Placement Confirmation: breath sounds checked- equal and bilateral and positive ETCO2 Dental Injury: Teeth and Oropharynx as per pre-operative assessment

## 2020-02-13 NOTE — Anesthesia Preprocedure Evaluation (Addendum)
Anesthesia Evaluation  Patient identified by MRN, date of birth, ID band Patient awake    Reviewed: Allergy & Precautions, H&P , NPO status , Patient's Chart, lab work & pertinent test results  Airway Mallampati: II  TM Distance: >3 FB Neck ROM: Full    Dental no notable dental hx. (+) Teeth Intact, Dental Advisory Given   Pulmonary neg pulmonary ROS,    Pulmonary exam normal breath sounds clear to auscultation       Cardiovascular hypertension, Pt. on medications + Peripheral Vascular Disease   Rhythm:Regular Rate:Normal + Systolic murmurs    Neuro/Psych negative neurological ROS  negative psych ROS   GI/Hepatic Neg liver ROS, GERD  ,  Endo/Other  diabetes, Insulin Dependent  Renal/GU ESRF and DialysisRenal disease  negative genitourinary   Musculoskeletal  (+) Arthritis ,   Abdominal   Peds  Hematology  (+) Blood dyscrasia, anemia ,   Anesthesia Other Findings   Reproductive/Obstetrics negative OB ROS                            Anesthesia Physical Anesthesia Plan  ASA: III  Anesthesia Plan: MAC   Post-op Pain Management:    Induction: Intravenous  PONV Risk Score and Plan: 2 and Propofol infusion and Treatment may vary due to age or medical condition  Airway Management Planned: Nasal Cannula  Additional Equipment:   Intra-op Plan:   Post-operative Plan:   Informed Consent: I have reviewed the patients History and Physical, chart, labs and discussed the procedure including the risks, benefits and alternatives for the proposed anesthesia with the patient or authorized representative who has indicated his/her understanding and acceptance.     Dental advisory given  Plan Discussed with: CRNA  Anesthesia Plan Comments:         Anesthesia Quick Evaluation

## 2020-02-13 NOTE — H&P (View-Only) (Signed)
Subjective: No complaints   Antibiotics:  Anti-infectives (From admission, onward)   Start     Dose/Rate Route Frequency Ordered Stop   02/14/20 1800  ceFAZolin (ANCEF) IVPB 2g/100 mL premix     Discontinue     2 g 200 mL/hr over 30 Minutes Intravenous Every T-Th-Sa (1800) 02/13/20 1104     02/09/20 1800  cefTRIAXone (ROCEPHIN) 1 g in sodium chloride 0.9 % 100 mL IVPB  Status:  Discontinued        1 g 200 mL/hr over 30 Minutes Intravenous Every 24 hours 02/08/20 2311 02/09/20 1137   02/09/20 1800  doxycycline (VIBRAMYCIN) 100 mg in sodium chloride 0.9 % 250 mL IVPB  Status:  Discontinued        100 mg 125 mL/hr over 120 Minutes Intravenous Every 12 hours 02/08/20 2311 02/09/20 1137   02/09/20 1300  cefTRIAXone (ROCEPHIN) 2 g in sodium chloride 0.9 % 100 mL IVPB     Discontinue     2 g 200 mL/hr over 30 Minutes Intravenous Every 24 hours 02/09/20 1137 02/14/20 1259   02/08/20 2030  cefTRIAXone (ROCEPHIN) 1 g in sodium chloride 0.9 % 100 mL IVPB        1 g 200 mL/hr over 30 Minutes Intravenous  Once 02/08/20 2027 02/08/20 2205   02/08/20 2030  azithromycin (ZITHROMAX) 500 mg in sodium chloride 0.9 % 250 mL IVPB        500 mg 250 mL/hr over 60 Minutes Intravenous  Once 02/08/20 2027 02/08/20 2346      Medications: Scheduled Meds: . amLODipine  10 mg Oral QHS  . aspirin EC  81 mg Oral QHS  . calcium acetate  667 mg Oral TID WC   And  . calcium acetate  667 mg Oral With snacks  . Chlorhexidine Gluconate Cloth  6 each Topical Q0600  . [START ON 02/14/2020] darbepoetin (ARANESP) injection - DIALYSIS  100 mcg Intravenous Q Tue-HD  . doxercalciferol  1 mcg Intravenous Q T,Th,Sa-HD  . heparin  5,000 Units Subcutaneous Q8H  . insulin aspart  0-6 Units Subcutaneous TID WC  . insulin aspart  2 Units Subcutaneous TID WC  . insulin glargine  6 Units Subcutaneous Daily  . labetalol  300 mg Oral QPM  . losartan  25 mg Oral BID  . multivitamin with minerals  1 tablet Oral q AM    . rosuvastatin  10 mg Oral QHS   Continuous Infusions: . [START ON 02/14/2020]  ceFAZolin (ANCEF) IV    . cefTRIAXone (ROCEPHIN)  IV 2 g (02/12/20 1308)   PRN Meds:.acetaminophen, loratadine, ondansetron (ZOFRAN) IV    Objective: Weight change: 3.1 kg No intake or output data in the 24 hours ending 02/13/20 1113 Blood pressure (!) 157/65, pulse 74, temperature 98.1 F (36.7 C), resp. rate 16, height 5\' 3"  (1.6 m), weight 60.1 kg, SpO2 93 %. Temp:  [97.8 F (36.6 C)-98.7 F (37.1 C)] 98.1 F (36.7 C) (07/26 0747) Pulse Rate:  [69-74] 74 (07/26 0747) Resp:  [16-18] 16 (07/26 0747) BP: (157-176)/(65-77) 157/65 (07/26 0747) SpO2:  [93 %-97 %] 93 % (07/26 0747) Weight:  [60.1 kg] 60.1 kg (07/26 0300)  Physical Exam: General: Alert and awake, oriented x3, not in any acute distress walking around room to door  Neuro: nonfocal  CBC:    BMET Recent Labs    02/11/20 0326  NA 136  K 3.6  CL 95*  CO2 28  GLUCOSE 171*  BUN 19  CREATININE 4.59*  CALCIUM 9.1     Liver Panel  Recent Labs    02/11/20 0326  ALBUMIN 2.7*       Sedimentation Rate No results for input(s): ESRSEDRATE in the last 72 hours. C-Reactive Protein No results for input(s): CRP in the last 72 hours.  Micro Results: Recent Results (from the past 720 hour(s))  Culture, blood (routine x 2)     Status: Abnormal   Collection Time: 02/08/20  6:04 PM   Specimen: BLOOD  Result Value Ref Range Status   Specimen Description BLOOD SITE NOT SPECIFIED  Final   Special Requests   Final    BOTTLES DRAWN AEROBIC AND ANAEROBIC Blood Culture results may not be optimal due to an inadequate volume of blood received in culture bottles   Culture  Setup Time   Final    GRAM POSITIVE COCCI IN CHAINS IN BOTH AEROBIC AND ANAEROBIC BOTTLES CRITICAL VALUE NOTED.  VALUE IS CONSISTENT WITH PREVIOUSLY REPORTED AND CALLED VALUE.    Culture (A)  Final    STREPTOCOCCUS VESTIBULARIS SUSCEPTIBILITIES PERFORMED ON  PREVIOUS CULTURE WITHIN THE LAST 5 DAYS. Performed at [facility], [place] [address], [place], [place] [address]    Report Status 02/11/2020 FINAL  Final  Culture, blood (routine x 2)     Status: Abnormal   Collection Time: 02/08/20  6:15 PM   Specimen: BLOOD  Result Value Ref Range Status   Specimen Description BLOOD LEFT ANTECUBITAL  Final   Special Requests   Final    BOTTLES DRAWN AEROBIC AND ANAEROBIC Blood Culture adequate volume   Culture  Setup Time   Final    GRAM POSITIVE COCCI IN CHAINS IN BOTH AEROBIC AND ANAEROBIC BOTTLES CRITICAL RESULT CALLED TO, READ BACK BY AND VERIFIED WITH: Park Breed [id number] FCP Performed at [facility], [place] [address], [place], [place] [address]    Culture STREPTOCOCCUS VESTIBULARIS (A)  Final   Report Status 02/11/2020 FINAL  Final   Organism ID, Bacteria STREPTOCOCCUS VESTIBULARIS  Final      Susceptibility   Streptococcus vestibularis - MIC*    TETRACYCLINE 0.5 SENSITIVE Sensitive     VANCOMYCIN 0.5 SENSITIVE Sensitive     CLINDAMYCIN <=0.25 SENSITIVE Sensitive     PENICILLIN Value in next row Sensitive      SENSITIVE0.12    CEFTRIAXONE Value in next row Sensitive      SENSITIVE0.12    * STREPTOCOCCUS VESTIBULARIS  Blood Culture ID Panel (Reflexed)     Status: Abnormal   Collection Time: 02/08/20  6:15 PM  Result Value Ref Range Status   Enterococcus species NOT DETECTED NOT DETECTED Final   Listeria monocytogenes NOT DETECTED NOT DETECTED Final   Staphylococcus species NOT DETECTED NOT DETECTED Final   Staphylococcus aureus (BCID) NOT DETECTED NOT DETECTED Final   Streptococcus species DETECTED (A) NOT DETECTED Final    Comment: Not Enterococcus species, Streptococcus agalactiae, Streptococcus pyogenes, or Streptococcus pneumoniae. CRITICAL RESULT CALLED TO, READ BACK BY AND VERIFIED WITH: PHARMD JEREMY F. [phone] [id number] FCP    Streptococcus agalactiae NOT DETECTED NOT DETECTED Final   Streptococcus pneumoniae NOT  DETECTED NOT DETECTED Final   Streptococcus pyogenes NOT DETECTED NOT DETECTED Final   Acinetobacter baumannii NOT DETECTED NOT DETECTED Final   Enterobacteriaceae species NOT DETECTED NOT DETECTED Final   Enterobacter cloacae complex NOT DETECTED NOT DETECTED Final   Escherichia coli NOT DETECTED NOT DETECTED Final   Klebsiella oxytoca NOT  DETECTED NOT DETECTED Final   Klebsiella pneumoniae NOT DETECTED NOT DETECTED Final   Proteus species NOT DETECTED NOT DETECTED Final   Serratia marcescens NOT DETECTED NOT DETECTED Final   Haemophilus influenzae NOT DETECTED NOT DETECTED Final   Neisseria meningitidis NOT DETECTED NOT DETECTED Final   Pseudomonas aeruginosa NOT DETECTED NOT DETECTED Final   Candida albicans NOT DETECTED NOT DETECTED Final   Candida glabrata NOT DETECTED NOT DETECTED Final   Candida krusei NOT DETECTED NOT DETECTED Final   Candida parapsilosis NOT DETECTED NOT DETECTED Final   Candida tropicalis NOT DETECTED NOT DETECTED Final    Comment: Performed at [facility], [place] [address], [place], [place] [address]  SARS Coronavirus 2 by RT PCR (hospital order, performed in [facility] hospital lab) Nasopharyngeal Nasopharyngeal Swab     Status: None   Collection Time: 02/08/20  8:48 PM   Specimen: Nasopharyngeal Swab  Result Value Ref Range Status   SARS Coronavirus 2 NEGATIVE NEGATIVE Final    Comment: (NOTE) SARS-CoV-2 target nucleic acids are NOT DETECTED.  The SARS-CoV-2 RNA is generally detectable in upper and lower respiratory specimens during the acute phase of infection. The lowest concentration of SARS-CoV-2 viral copies this assay can detect is 250 copies / mL. A negative result does not preclude SARS-CoV-2 infection and should not be used as the sole basis for treatment or other patient management decisions.  A negative result may occur with improper specimen collection / handling, submission of specimen other than nasopharyngeal swab, presence of viral  mutation(s) within the areas targeted by this assay, and inadequate number of viral copies (<250 copies / mL). A negative result must be combined with clinical observations, patient history, and epidemiological information.  Fact Sheet for Patients:   StrictlyIdeas.no  Fact Sheet for Healthcare Providers: BankingDealers.co.za  This test is not yet approved or  cleared by the [place] FDA and has been authorized for detection and/or diagnosis of SARS-CoV-2 by FDA under an Emergency Use Authorization (EUA).  This EUA will remain in effect (meaning this test can be used) for the duration of the COVID-19 declaration under Section 564(b)(1) of the Act, 21 U.S.C. section 360bbb-3(b)(1), unless the authorization is terminated or revoked sooner.  Performed at [facility], [place] [address], [place], [place] [address]   MRSA PCR Screening     Status: None   Collection Time: 02/09/20 11:32 AM  Result Value Ref Range Status   MRSA by PCR NEGATIVE NEGATIVE Final    Comment:        The GeneXpert MRSA Assay (FDA approved for NASAL specimens only), is one component of a comprehensive MRSA colonization surveillance program. It is not intended to diagnose MRSA infection nor to guide or monitor treatment for MRSA infections. Performed at [facility], [place] [address], [place], [place] [address]   Respiratory Panel by PCR     Status: None   Collection Time: 02/09/20  5:30 PM   Specimen: Nasopharyngeal Swab; Respiratory  Result Value Ref Range Status   Adenovirus NOT DETECTED NOT DETECTED Final   Coronavirus 229E NOT DETECTED NOT DETECTED Final    Comment: (NOTE) The Coronavirus on the Respiratory Panel, DOES NOT test for the novel  Coronavirus (2019 nCoV)    Coronavirus HKU1 NOT DETECTED NOT DETECTED Final   Coronavirus NL63 NOT DETECTED NOT DETECTED Final   Coronavirus OC43 NOT DETECTED NOT DETECTED Final   Metapneumovirus  NOT DETECTED NOT DETECTED Final   Rhinovirus /  Enterovirus NOT DETECTED NOT DETECTED Final   Influenza A NOT DETECTED NOT DETECTED Final   Influenza B NOT DETECTED NOT DETECTED Final   Parainfluenza Virus 1 NOT DETECTED NOT DETECTED Final   Parainfluenza Virus 2 NOT DETECTED NOT DETECTED Final   Parainfluenza Virus 3 NOT DETECTED NOT DETECTED Final   Parainfluenza Virus 4 NOT DETECTED NOT DETECTED Final   Respiratory Syncytial Virus NOT DETECTED NOT DETECTED Final   Bordetella pertussis NOT DETECTED NOT DETECTED Final   Chlamydophila pneumoniae NOT DETECTED NOT DETECTED Final   Mycoplasma pneumoniae NOT DETECTED NOT DETECTED Final    Comment: Performed at [facility], [place] [address] [place], [place] [address]  Culture, blood (routine x 2)     Status: None (Preliminary result)   Collection Time: 02/10/20  9:02 AM   Specimen: BLOOD  Result Value Ref Range Status   Specimen Description BLOOD LEFT ANTECUBITAL  Final   Special Requests   Final    BOTTLES DRAWN AEROBIC AND ANAEROBIC Blood Culture adequate volume   Culture   Final    NO GROWTH 3 DAYS Performed at [facility], [place] [address], [place], [place] [address]    Report Status PENDING  Incomplete  Culture, blood (routine x 2)     Status: None (Preliminary result)   Collection Time: 02/10/20  9:05 AM   Specimen: BLOOD LEFT HAND  Result Value Ref Range Status   Specimen Description BLOOD LEFT HAND  Final   Special Requests   Final    BOTTLES DRAWN AEROBIC AND ANAEROBIC Blood Culture adequate volume   Culture   Final    NO GROWTH 3 DAYS Performed at [facility], [place] [address] [place], [place] [address]    Report Status PENDING  Incomplete    Studies/Results: ECHOCARDIOGRAM COMPLETE  Result Date: 02/11/2020    ECHOCARDIOGRAM REPORT   Patient Name:   SAGA BALTHAZAR Date of Exam: 02/11/2020 Medical Rec #:  [id number]         Height:       63.0 in Accession #:    [id number]        Weight:       121.3 lb  Date of Birth:  [birth year]        BSA:          1.563 m Patient Age:    44 years          BP:           165/68 mmHg Patient Gender: F                 HR:           76 bpm. Exam Location:  Inpatient Procedure: 2D Echo Indications:    Bacteremia 790.7 / R78.81  History:        Patient has prior history of Echocardiogram examinations, most                 recent 10/21/2019. Risk Factors:Diabetes, Dyslipidemia, Non-Smoker                 and Hypertension. ESRD, Pneumonia.  Sonographer:    Leavy Cella Referring Phys: [place]  1. Left ventricular ejection fraction, by estimation, is 55 to 60%. The left ventricle has normal function. The left ventricle has no regional wall motion abnormalities. There is mild concentric left ventricular hypertrophy. Left ventricular diastolic parameters are consistent with Grade II diastolic dysfunction (pseudonormalization).  2. Right ventricular systolic function is normal. The right ventricular size is mildly enlarged. There is moderately elevated pulmonary artery systolic pressure.  3. Left atrial size was moderately dilated.  4. Right atrial size was moderately dilated.  5. The mitral valve is normal in structure. Trivial mitral valve regurgitation.  6. Tricuspid valve regurgitation is severe.  7. The aortic valve is tricuspid. Aortic valve regurgitation is not visualized. Mild aortic valve sclerosis is present, with no evidence of aortic valve stenosis.  8. The inferior vena cava is normal in size with <50% respiratory variability, suggesting right atrial pressure of 8 mmHg. Comparison(s): No significant change from prior study. Conclusion(s)/Recommendation(s): No evidence of valvular vegetations on this transthoracic echocardiogram. Would recommend a transesophageal echocardiogram to exclude infective endocarditis if clinically indicated. FINDINGS  Left Ventricle: Left ventricular ejection fraction, by estimation, is 55 to 60%. The left ventricle has  normal function. The left ventricle has no regional wall motion abnormalities. The left ventricular internal cavity size was normal in size. There is  mild concentric left ventricular hypertrophy. Left ventricular diastolic parameters are consistent with Grade II diastolic dysfunction (pseudonormalization). Right Ventricle: The right ventricular size is mildly enlarged. Right vetricular wall thickness was not assessed. Right ventricular systolic function is normal. There is moderately elevated pulmonary artery systolic pressure. The tricuspid regurgitant velocity is 3.23 m/s, and with an assumed right atrial pressure of 8 mmHg, the estimated right ventricular systolic pressure is 08.6 mmHg. Left Atrium: Left atrial size was moderately dilated. Right Atrium: Right atrial size was moderately dilated. Pericardium: A small pericardial effusion is present. Mitral Valve: The mitral valve is normal in structure. Trivial mitral valve regurgitation. Tricuspid Valve: The tricuspid valve is normal in structure. Tricuspid valve regurgitation is severe. Aortic Valve: The aortic valve is tricuspid. Aortic valve regurgitation is not visualized. Mild aortic valve sclerosis is present, with no evidence of aortic valve stenosis. There is mild calcification of the aortic valve. Pulmonic Valve: The pulmonic valve was grossly normal. Pulmonic valve regurgitation is mild. Aorta: The aortic root, ascending aorta and aortic arch are all structurally normal, with no evidence of dilitation or obstruction. Venous: The inferior vena cava is normal in size with less than 50% respiratory variability, suggesting right atrial pressure of 8 mmHg. IAS/Shunts: The atrial septum is grossly normal.  LEFT VENTRICLE PLAX 2D LVIDd:         4.30 cm  Diastology LVIDs:         2.90 cm  LV e' lateral:   8.70 cm/s LV PW:         1.20 cm  LV E/e' lateral: 13.2 LV IVS:        1.00 cm  LV e' medial:    7.83 cm/s LVOT diam:     1.70 cm  LV E/e' medial:  14.7 LVOT  Area:     2.27 cm  RIGHT VENTRICLE RV S prime:     12.20 cm/s TAPSE (M-mode): 1.7 cm LEFT ATRIUM           Index       RIGHT ATRIUM           Index LA diam:      4.20 cm 2.69 cm/m  RA Area:     17.70 cm LA Vol (A2C): 37.1 ml 23.73 ml/m RA Volume:   63.20 ml  40.43 ml/m LA Vol (A4C): 58.4 ml 37.36 ml/m   AORTA Ao Root diam: 2.50 cm MITRAL VALVE  TRICUSPID VALVE MV Area (PHT): 2.99 cm     TR Peak grad:   41.7 mmHg MV Decel Time: 254 msec     TR Vmax:        323.00 cm/s MV E velocity: 115.00 cm/s MV A velocity: 107.00 cm/s  SHUNTS MV E/A ratio:  1.07         Systemic Diam: 1.70 cm Buford Dresser MD Electronically signed by Buford Dresser MD Signature Date/Time: 02/11/2020/4:13:50 PM    Final       Assessment/Plan:  INTERVAL HISTORY:  For TEE today   Principal Problem:   Streptococcal bacteremia Active Problems:   Cerebral infarction ([place])   Essential hypertension, benign   HLD (hyperlipidemia)   Pneumonia   ESRD on hemodialysis ([place])   Diabetes mellitus type I ([place])   Hypoglycemia   Hypothermia    [name] is a 44 y.o. female with end-stage renal disease on hemodialysis admitted with fevers, found to have a right middle lobe pneumonia, then found to have Streptococcus vestibularis bacteremia.  #1 Streptococcus vestibularis bacteremia: I am concerned for endocarditis.  TEE today and I am ordering Panorex  Continue ceftriaxone but will soon change to ancef for ease of dosing with HD   LOS: 4 days   Alcide Evener 02/13/2020, 11:13 AM

## 2020-02-13 NOTE — Progress Notes (Signed)
  Echocardiogram Echocardiogram Transesophageal has been performed.  Geoffery Lyons Swaim 02/13/2020, 3:03 PM

## 2020-02-14 ENCOUNTER — Encounter (HOSPITAL_COMMUNITY): Payer: Self-pay | Admitting: Cardiovascular Disease

## 2020-02-14 DIAGNOSIS — J189 Pneumonia, unspecified organism: Secondary | ICD-10-CM | POA: Diagnosis not present

## 2020-02-14 DIAGNOSIS — R7881 Bacteremia: Secondary | ICD-10-CM | POA: Diagnosis not present

## 2020-02-14 DIAGNOSIS — E1022 Type 1 diabetes mellitus with diabetic chronic kidney disease: Secondary | ICD-10-CM | POA: Diagnosis not present

## 2020-02-14 LAB — CBC
HCT: 32.8 % — ABNORMAL LOW (ref 36.0–46.0)
Hemoglobin: 10.1 g/dL — ABNORMAL LOW (ref 12.0–15.0)
MCH: 25.6 pg — ABNORMAL LOW (ref 26.0–34.0)
MCHC: 30.8 g/dL (ref 30.0–36.0)
MCV: 83.2 fL (ref 80.0–100.0)
Platelets: 150 10*3/uL (ref 150–400)
RBC: 3.94 MIL/uL (ref 3.87–5.11)
RDW: 17.8 % — ABNORMAL HIGH (ref 11.5–15.5)
WBC: 6.7 10*3/uL (ref 4.0–10.5)
nRBC: 0 % (ref 0.0–0.2)

## 2020-02-14 LAB — GLUCOSE, CAPILLARY
Glucose-Capillary: 104 mg/dL — ABNORMAL HIGH (ref 70–99)
Glucose-Capillary: 113 mg/dL — ABNORMAL HIGH (ref 70–99)
Glucose-Capillary: 157 mg/dL — ABNORMAL HIGH (ref 70–99)
Glucose-Capillary: 216 mg/dL — ABNORMAL HIGH (ref 70–99)
Glucose-Capillary: 71 mg/dL (ref 70–99)
Glucose-Capillary: 96 mg/dL (ref 70–99)

## 2020-02-14 LAB — RENAL FUNCTION PANEL
Albumin: 2.8 g/dL — ABNORMAL LOW (ref 3.5–5.0)
Anion gap: 14 (ref 5–15)
BUN: 31 mg/dL — ABNORMAL HIGH (ref 6–20)
CO2: 27 mmol/L (ref 22–32)
Calcium: 9.5 mg/dL (ref 8.9–10.3)
Chloride: 95 mmol/L — ABNORMAL LOW (ref 98–111)
Creatinine, Ser: 6.29 mg/dL — ABNORMAL HIGH (ref 0.44–1.00)
GFR calc Af Amer: 9 mL/min — ABNORMAL LOW (ref >60)
GFR calc non Af Amer: 8 mL/min — ABNORMAL LOW (ref >60)
Glucose, Bld: 176 mg/dL — ABNORMAL HIGH (ref 70–99)
Phosphorus: 3.6 mg/dL (ref 2.5–4.6)
Potassium: 4.2 mmol/L (ref 3.5–5.1)
Sodium: 136 mmol/L (ref 135–145)

## 2020-02-14 MED ORDER — INSULIN PUMP
Freq: Three times a day (TID) | SUBCUTANEOUS | Status: DC
  Filled 2020-02-14: qty 1

## 2020-02-14 MED ORDER — DARBEPOETIN ALFA 100 MCG/0.5ML IJ SOSY
PREFILLED_SYRINGE | INTRAMUSCULAR | Status: AC
  Administered 2020-02-14: 100 ug via INTRAVENOUS
  Filled 2020-02-14: qty 0.5

## 2020-02-14 MED ORDER — DOXERCALCIFEROL 4 MCG/2ML IV SOLN
INTRAVENOUS | Status: AC
  Administered 2020-02-14: 1 ug via INTRAVENOUS
  Filled 2020-02-14: qty 2

## 2020-02-14 MED FILL — Insulin Aspart Inj 100 Unit/ML: SUBCUTANEOUS | Qty: 10 | Status: AC

## 2020-02-14 NOTE — Procedures (Signed)
Patient seen on Hemodialysis. BP (!) 167/80   Pulse 72   Temp 98.2 F (36.8 C) (Oral)   Resp 17   Ht 5\' 3"  (1.6 m)   Wt 58 kg   SpO2 100%   BMI 22.65 kg/m   QB 425, UF goal 2.5L Tolerating treatment without complaints at this time.   Elmarie Shiley MD [facility]. Office # [phone] 10:37 AM

## 2020-02-14 NOTE — Progress Notes (Signed)
Patient ID: [name], female   DOB: [birth year], 44 y.o.   MRN: [id number] [facility] Progress Note   Assessment/ Plan:   1.  Right lower lobe pneumonia with Streptococcus vestibularis bacteremia: On ceftriaxone based on culture and sensitivities and without evidence of endocarditis on echocardiogram. She remains afebrile and hemodynamically stable. 2. ESRD: On a TTS dialysis schedule and with hemodialysis ongoing today; she is tolerating dialysis without problems. 3. Anemia: Hemoglobin 10.1 today- will get ESA in HD.  Intravenous iron on hold with bacteremia. 4. CKD-MBD: Calcium and phosphorus levels currently at goal on calcium acetate for phosphorus binding.  Continue Hectorol for PTH suppression. 5. Nutrition: Continue renal diet with renal multivitamin supplementation. 6. Hypertension: Blood pressures mildly elevated, continue to monitor on current antihypertensive therapy with hemodialysis for ultrafiltration.  Subjective:   Denies any complaints, does not have any chest pain or shortness of breath.  She will need to stay overnight for insulin pump transition.   Objective:   BP (!) 157/75   Pulse 73   Temp 98.2 F (36.8 C) (Oral)   Resp 17   Ht 5\' 3"  (1.6 m)   Wt 58 kg   SpO2 100%   BMI 22.65 kg/m   Physical Exam: Gen: Comfortably resting in dialysis. CVS: Pulse regular rhythm, normal rate, S1 and S2 normal without murmur Resp: Clear to auscultation bilaterally, no rales/rhonchi Abd: Soft, flat, nontender Ext: No lower extremity edema.  Mildly pulsatile upper arm arteriovenous graft connected to dialysis.  Labs: BMET Recent Labs  Lab 02/08/20 1715 02/08/20 2355 02/09/20 0405 02/10/20 0907 02/11/20 0326 02/13/20 1355 02/14/20 0116  NA 136  --  136 138 136 137 136  K 3.9  --  3.6 3.4* 3.6 3.7 4.2  CL 96*  --  95* 96* 95* 96* 95*  CO2 27  --  22 29 28   --  27  GLUCOSE 190*  --  179* 65* 171* 99 176*  BUN 16  --  23* 10 19 30* 31*   CREATININE 4.61* 5.08* 5.42* 3.44* 4.59* 6.10* 6.29*  CALCIUM 9.0  --  8.8* 9.3 9.1  --  9.5  PHOS  --   --   --  3.7 3.9  --  3.6   CBC Recent Labs  Lab 02/08/20 1715 02/08/20 2355 02/09/20 0405 02/09/20 0405 02/10/20 0907 02/11/20 0606 02/13/20 1355 02/14/20 0116  WBC 8.6   < > 9.7  --  6.0 5.8  --  6.7  NEUTROABS 7.6  --   --   --  4.4  --   --   --   HGB 10.1*   < > 9.5*   < > 10.8* 10.0* 12.2 10.1*  HCT 34.0*   < > 31.4*   < > 35.5* 33.3* 36.0 32.8*  MCV 84.4   < > 85.1  --  83.3 85.2  --  83.2  PLT 213   < > 249  --  227 196  --  150   < > = values in this interval not displayed.     Medications:    . amLODipine  10 mg Oral QHS  . aspirin EC  81 mg Oral QHS  . calcium acetate  667 mg Oral TID WC   And  . calcium acetate  667 mg Oral With snacks  . Chlorhexidine Gluconate Cloth  6 each Topical Q0600  . darbepoetin (ARANESP) injection - DIALYSIS  100 mcg Intravenous Q Tue-HD  . doxercalciferol  1 mcg Intravenous Q T,Th,Sa-HD  . heparin  5,000 Units Subcutaneous Q8H  . insulin aspart  0-6 Units Subcutaneous TID WC  . labetalol  300 mg Oral QPM  . losartan  25 mg Oral BID  . multivitamin with minerals  1 tablet Oral q AM  . rosuvastatin  10 mg Oral QHS   Elmarie Shiley, MD 02/14/2020, 10:33 AM

## 2020-02-14 NOTE — Progress Notes (Signed)
Inpatient Diabetes Program Recommendations  AACE/ADA: New Consensus Statement on Inpatient Glycemic Control (2015)  Target Ranges:  Prepandial:   less than 140 mg/dL      Peak postprandial:   less than 180 mg/dL (1-2 hours)      Critically ill patients:  140 - 180 mg/dL   Lab Results  Component Value Date   GLUCAP 96 02/14/2020   HGBA1C 6.2 (H) 02/08/2020    Diabetes history:  DM1  Outpatient Diabetes medications:  Insulin Pump-T-Slim & Dexcom  Current orders for Inpatient glycemic control:  Insulin pump  Note:  Spoke with patient at bedside.  Reviewed pump setting with her which were adjusted by Peri Jefferson, [place] [facility] on 12/16/2019    Basal Rates:  12A   0.28/hr 3A     0.35/hr 9A     0.29/hr 7P     0.3/hr Total 24 HR Basal 7.34units/24 hrs  Insulin Carb Ration: 12A    1:22 (1 unit covers 22 grams of carbs) 3A      1:26 (1 units covers 26 grams of carbs) 9A      1:24 (1 units covers 24 grams of carbs) 7P      1:23 (1 unit covers 23 grams of carbs)  Insulin Sensitivity: 1:85 (1 unit drops glucose 85 mg/dl)  Target Glucose: 120mg /dl  Patient has had labile blood sugars.  She states she is often hypoglycemic at home overnight and has to remove her insulin pump.  She normally wears a Dexcom but ran out of supplies prior to hospitalization.  Her Dexcom supplies have been delivered and are at bedside along with her pump supplies.     Dr. Sloan Leiter would like to place pump back on patient and prepare for DC tomorrow.  Contacted [facility] and spoke with Jan who is the person that assists their patients with pump adjustments.  Explained that patient is inpatient with hypoglycemia at home of 29.  Reviewed pts blood sugars while inpatient and SQ insulin coverage.  While on speaker phone pt expressed concern of frequent lows overnight. Jan  assisted pt with basal decreases as listed below;  12A   0.2/hr 3A     0.3/hr 9A     0.26/hr 7P     0.27/hr  Total 24 hr  Basal 6.35units/24 hrs  Reviewed recommendations with Dr. Sloan Leiter and with verbal order placed Pump Order Set.  Patient may also place Dexcom back on but will continue to check blood sugars with finger sticks.   Dexcom has not been upgraded to closed loop settings.  Patient will need to call T-Slim for update.  Called Pharmacy and spoke with Oneida Arenas D and she will expedite a vial of Novolog to be given to patient so she can connect to pump.    Will continue to follow while inpatient.  Thank you, Reche Dixon, RN, BSN Diabetes Coordinator Inpatient Diabetes Program [phone] (team pager from 8a-5p)

## 2020-02-14 NOTE — Progress Notes (Signed)
PROGRESS NOTE        PATIENT DETAILS Name: [name] Age: 44 y.o. Sex: female Date of Birth: 09/01/1975 Admit Date: 02/08/2020 Admitting Physician Antonieta Pert, MD [id number], Iona Beard, MD  Brief Narrative: Patient is a 44 y.o. female with history of DM-1 on insulin pump, ESRD on HD TTS, HTN-who presented with altered mental status secondary to hypoglycemia. Upon further evaluation in the emergency room-chest x-ray was consistent with pneumonia-subsequent blood cultures positive  streptococcal vestibularis. See Below for further details.  Note-not vaccinated against Covid-COVID-19 PCR negative on admission  Significant events: 7/21>> admit for unresponsiveness-hypoglycemia/hypothermia. Chest x-ray positive for pneumonia, blood cultures positive for streptococcal vestibularis bacteremia.  Significant studies: 7/26>> TEE: No vegetation seen, RV systolic function is severely reduced, severe TR 7/24>>Echo: EF 46-65%, grade 2 diastolic dysfunction 9/93>> chest x-ray:. Progressive infiltrate right mid lung consistent with progression of pneumonia. 7/21>> chest x-ray:  airspace disease right midlung-consistent with bronchopneumonia  Antimicrobial therapy: Ancef: 7/27 with HD (TTS) Rocephin: 7/21>>7/26 Zithromax: 7/21 x 1  Microbiology data: 7/23>> blood culture: neg so far 7/21>> blood culture: Strep Vestibularis 7/21>>SARS Coronavirus 2: Negative  Procedures : None  Consults: Nephrology ID  DVT Prophylaxis : heparin injection 5,000 Units Start: 02/08/20 2315   Subjective: Seen earlier this morning at hemodialysis-no major issues overnight.  Assessment/Plan: Acute metabolic encephalopathy secondary to hypoglycemia: Resolved-she is completely awake and alert.   Hypoglycemia-history of DM-1 on insulin pump (A1c 6.2 on 02/08/2020): Claims she woke up in an ambulance on the day of admission-and was told she was hypoglycemic.  She is maintained  on an insulin pump-Per patient-she ran out of her monitoring device a few days back that notifies her of episodes of hypoglycemia.  Given streptococcal bacteremia-suspect she had poor oral intake that likely contributed to hypoglycemic episode on presentation.  Appears to have very brittle diabetes-with both hypoglycemic and hyperglycemic episodes on the same day.  Had a few episodes of hypoglycemia yesterday after 6 units of Lantus but was n.p.o. for TEE.  Have reached out to diabetic coordinator to see if we can get in touch with the patient's primary endocrinologist-and get her back on insulin pump.  Plan to observe her overnight on insulin pump given severity of hypoglycemia on presentation before consideration of discharge tomorrow.   Recent Labs    02/14/20 0055 02/14/20 0629 02/14/20 1242  GLUCAP 157* 216* 96    Streptococcal vestibularis bacteremia: Initial blood cultures on admission positive for Streptococcus vestibularis-repeat blood culture on 7/23 NEG so far.  HD site without any obvious infection.  Chest x-ray confirms PNA as well-not sure if this is the foci for infection.  TTE negative for vegetations-no vegetation seen on TEE.  Panorex x-ray negative for infection.  Spoke with Dr. Lucianne Lei dam-recommendations at 2 weeks of Ancef with hemodialysis.  Community-acquired pneumonia: Acknowledges cough/URI symptoms for a week or so prior to this hospital stay-chest x-ray confirms PNA (repeated after HD)-on Rocephin.  Clinically improved.  No fever or shortness of breath.  Vomiting: Resolved.  Likely secondary to bacteremia-abdominal exam appears benign  ESRD on HD TTS: Nephrology consulted and directing care.  HTN: BP relatively stable-continue labetalol, amlodipine and losartan.  Follow and adjust accordingly.    RV dysfunction-likely from pulmonary hypertension-seen on TEE-suspect incidental finding-will need outpatient follow-up with cardiology.  Anemia likely secondary to chronic  disease: Hemoglobin stable-defer IV  iron/Aranesp to nephrology.  Diet: Diet Order            Diet renal with fluid restriction Fluid restriction: 1200 mL Fluid; Room service appropriate? Yes; Fluid consistency: Thin  Diet effective now                  Code Status: Full code   Family Communication: Aunt over the phone on 7/24  Disposition Plan: Status is: Inpatient   Inpatient level of care appropriate due to severity of illness  Dispo: The patient is from: Home              Anticipated d/c is to: Home              Anticipated d/c date is: > 1 days              Patient currently is not medically stable to d/c.  Barriers to Discharge: Streptococcal bacteremia-severe hypoglycemia on insulin pump-insulin pump to be restarted today-monitor overnight-if no major episodes of hypoglycemia-discharge on 7/28.  Antimicrobial agents: Anti-infectives (From admission, onward)   Start     Dose/Rate Route Frequency Ordered Stop   02/14/20 1800  ceFAZolin (ANCEF) IVPB 2g/100 mL premix     Discontinue     2 g 200 mL/hr over 30 Minutes Intravenous Every T-Th-Sa (1800) 02/13/20 1104     02/09/20 1800  cefTRIAXone (ROCEPHIN) 1 g in sodium chloride 0.9 % 100 mL IVPB  Status:  Discontinued        1 g 200 mL/hr over 30 Minutes Intravenous Every 24 hours 02/08/20 2311 02/09/20 1137   02/09/20 1800  doxycycline (VIBRAMYCIN) 100 mg in sodium chloride 0.9 % 250 mL IVPB  Status:  Discontinued        100 mg 125 mL/hr over 120 Minutes Intravenous Every 12 hours 02/08/20 2311 02/09/20 1137   02/09/20 1300  cefTRIAXone (ROCEPHIN) 2 g in sodium chloride 0.9 % 100 mL IVPB        2 g 200 mL/hr over 30 Minutes Intravenous Every 24 hours 02/09/20 1137 02/14/20 1259   02/08/20 2030  cefTRIAXone (ROCEPHIN) 1 g in sodium chloride 0.9 % 100 mL IVPB        1 g 200 mL/hr over 30 Minutes Intravenous  Once 02/08/20 2027 02/08/20 2205   02/08/20 2030  azithromycin (ZITHROMAX) 500 mg in sodium chloride 0.9 % 250 mL  IVPB        500 mg 250 mL/hr over 60 Minutes Intravenous  Once 02/08/20 2027 02/08/20 2346       Time spent: 25 minutes-Greater than 50% of this time was spent in counseling, explanation of diagnosis, planning of further management, and coordination of care.  MEDICATIONS: Scheduled Meds: . amLODipine  10 mg Oral QHS  . aspirin EC  81 mg Oral QHS  . calcium acetate  667 mg Oral TID WC   And  . calcium acetate  667 mg Oral With snacks  . Chlorhexidine Gluconate Cloth  6 each Topical Q0600  . darbepoetin (ARANESP) injection - DIALYSIS  100 mcg Intravenous Q Tue-HD  . doxercalciferol  1 mcg Intravenous Q T,Th,Sa-HD  . heparin  5,000 Units Subcutaneous Q8H  . insulin pump   Subcutaneous TID WC, HS, 0200  . labetalol  300 mg Oral QPM  . losartan  25 mg Oral BID  . multivitamin with minerals  1 tablet Oral q AM  . rosuvastatin  10 mg Oral QHS   Continuous Infusions: .  ceFAZolin (ANCEF)  IV     PRN Meds:.acetaminophen, loratadine, ondansetron (ZOFRAN) IV   PHYSICAL EXAM: Vital signs: Vitals:   02/14/20 1000 02/14/20 1030 02/14/20 1100 02/14/20 1241  BP: (!) 157/75 (!) 167/80 (!) 178/91 (!) 176/79  Pulse: 73 72 75 73  Resp:   16 13  Temp:   98.1 F (36.7 C) 97.8 F (36.6 C)  TempSrc:   Oral   SpO2:   98% 100%  Weight:   55.5 kg   Height:       Filed Weights   02/13/20 1316 02/14/20 0725 02/14/20 1100  Weight: 60.1 kg 58 kg 55.5 kg   Body mass index is 21.67 kg/m.   Gen Exam:Alert awake-not in any distress HEENT:atraumatic, normocephalic Chest: B/L clear to auscultation anteriorly CVS:S1S2 regular Abdomen:soft non tender, non distended Extremities:no edema Neurology: Non focal Skin: no rash  I have personally reviewed following labs and imaging studies  LABORATORY DATA: CBC: Recent Labs  Lab 02/08/20 1715 02/08/20 1715 02/08/20 2355 02/08/20 2355 02/09/20 0405 02/10/20 0907 02/11/20 0606 02/13/20 1355 02/14/20 0116  WBC 8.6   < > 9.4  --  9.7 6.0  5.8  --  6.7  NEUTROABS 7.6  --   --   --   --  4.4  --   --   --   HGB 10.1*   < > 10.1*   < > 9.5* 10.8* 10.0* 12.2 10.1*  HCT 34.0*   < > 33.7*   < > 31.4* 35.5* 33.3* 36.0 32.8*  MCV 84.4   < > 83.6  --  85.1 83.3 85.2  --  83.2  PLT 213   < > 251  --  249 227 196  --  150   < > = values in this interval not displayed.    Basic Metabolic Panel: Recent Labs  Lab 02/08/20 1715 02/08/20 2355 02/09/20 0405 02/10/20 0907 02/11/20 0326 02/13/20 1355 02/14/20 0116  NA 136  --  136 138 136 137 136  K 3.9  --  3.6 3.4* 3.6 3.7 4.2  CL 96*  --  95* 96* 95* 96* 95*  CO2 27  --  22 29 28   --  27  GLUCOSE 190*  --  179* 65* 171* 99 176*  BUN 16  --  23* 10 19 30* 31*  CREATININE 4.61*   < > 5.42* 3.44* 4.59* 6.10* 6.29*  CALCIUM 9.0  --  8.8* 9.3 9.1  --  9.5  PHOS  --   --   --  3.7 3.9  --  3.6   < > = values in this interval not displayed.    GFR: Estimated Creatinine Clearance: 9.7 mL/min (A) (by C-G formula based on SCr of 6.29 mg/dL (H)).  Liver Function Tests: Recent Labs  Lab 02/10/20 0907 02/11/20 0326 02/14/20 0116  ALBUMIN 3.1* 2.7* 2.8*   No results for input(s): LIPASE, AMYLASE in the last 168 hours. No results for input(s): AMMONIA in the last 168 hours.  Coagulation Profile: No results for input(s): INR, PROTIME in the last 168 hours.  Cardiac Enzymes: No results for input(s): CKTOTAL, CKMB, CKMBINDEX, TROPONINI in the last 168 hours.  BNP (last 3 results) No results for input(s): PROBNP in the last 8760 hours.  Lipid Profile: No results for input(s): CHOL, HDL, LDLCALC, TRIG, CHOLHDL, LDLDIRECT in the last 72 hours.  Thyroid Function Tests: No results for input(s): TSH, T4TOTAL, FREET4, T3FREE, THYROIDAB in the last 72 hours.  Anemia Panel: No results  for input(s): VITAMINB12, FOLATE, FERRITIN, TIBC, IRON, RETICCTPCT in the last 72 hours.  Urine analysis:    Component Value Date/Time   COLORURINE YELLOW 03/29/2014 1734   APPEARANCEUR CLEAR  03/29/2014 1734   LABSPEC 1.015 03/29/2014 1734   PHURINE 8.0 03/29/2014 1734   GLUCOSEU >1000 (A) 03/29/2014 1734   HGBUR SMALL (A) 03/29/2014 1734   BILIRUBINUR NEGATIVE 03/29/2014 1734   KETONESUR NEGATIVE 03/29/2014 1734   PROTEINUR 100 (A) 03/29/2014 1734   UROBILINOGEN 0.2 03/29/2014 1734   NITRITE NEGATIVE 03/29/2014 1734   LEUKOCYTESUR TRACE (A) 03/29/2014 1734    Sepsis Labs: Lactic Acid, Venous    Component Value Date/Time   LATICACIDVEN 1.5 02/09/2020 0959    MICROBIOLOGY: Recent Results (from the past 240 hour(s))  Culture, blood (routine x 2)     Status: Abnormal   Collection Time: 02/08/20  6:04 PM   Specimen: BLOOD  Result Value Ref Range Status   Specimen Description BLOOD SITE NOT SPECIFIED  Final   Special Requests   Final    BOTTLES DRAWN AEROBIC AND ANAEROBIC Blood Culture results may not be optimal due to an inadequate volume of blood received in culture bottles   Culture  Setup Time   Final    GRAM POSITIVE COCCI IN CHAINS IN BOTH AEROBIC AND ANAEROBIC BOTTLES CRITICAL VALUE NOTED.  VALUE IS CONSISTENT WITH PREVIOUSLY REPORTED AND CALLED VALUE.    Culture (A)  Final    STREPTOCOCCUS VESTIBULARIS SUSCEPTIBILITIES PERFORMED ON PREVIOUS CULTURE WITHIN THE LAST 5 DAYS. Performed at [facility], [place] [address] [place], [place] [address]    Report Status 02/11/2020 FINAL  Final  Culture, blood (routine x 2)     Status: Abnormal   Collection Time: 02/08/20  6:15 PM   Specimen: BLOOD  Result Value Ref Range Status   Specimen Description BLOOD LEFT ANTECUBITAL  Final   Special Requests   Final    BOTTLES DRAWN AEROBIC AND ANAEROBIC Blood Culture adequate volume   Culture  Setup Time   Final    GRAM POSITIVE COCCI IN CHAINS IN BOTH AEROBIC AND ANAEROBIC BOTTLES CRITICAL RESULT CALLED TO, READ BACK BY AND VERIFIED WITH: Park Breed [id number] FCP Performed at [facility], [place] [address], [place], [place] [address]    Culture  STREPTOCOCCUS VESTIBULARIS (A)  Final   Report Status 02/11/2020 FINAL  Final   Organism ID, Bacteria STREPTOCOCCUS VESTIBULARIS  Final      Susceptibility   Streptococcus vestibularis - MIC*    TETRACYCLINE 0.5 SENSITIVE Sensitive     VANCOMYCIN 0.5 SENSITIVE Sensitive     CLINDAMYCIN <=0.25 SENSITIVE Sensitive     PENICILLIN Value in next row Sensitive      SENSITIVE0.12    CEFTRIAXONE Value in next row Sensitive      SENSITIVE0.12    * STREPTOCOCCUS VESTIBULARIS  Blood Culture ID Panel (Reflexed)     Status: Abnormal   Collection Time: 02/08/20  6:15 PM  Result Value Ref Range Status   Enterococcus species NOT DETECTED NOT DETECTED Final   Listeria monocytogenes NOT DETECTED NOT DETECTED Final   Staphylococcus species NOT DETECTED NOT DETECTED Final   Staphylococcus aureus (BCID) NOT DETECTED NOT DETECTED Final   Streptococcus species DETECTED (A) NOT DETECTED Final    Comment: Not Enterococcus species, Streptococcus agalactiae, Streptococcus pyogenes, or Streptococcus pneumoniae. CRITICAL RESULT CALLED TO, READ BACK BY AND VERIFIED WITH: PHARMD JEREMY F. 1059 [id number] FCP    Streptococcus agalactiae NOT  DETECTED NOT DETECTED Final   Streptococcus pneumoniae NOT DETECTED NOT DETECTED Final   Streptococcus pyogenes NOT DETECTED NOT DETECTED Final   Acinetobacter baumannii NOT DETECTED NOT DETECTED Final   Enterobacteriaceae species NOT DETECTED NOT DETECTED Final   Enterobacter cloacae complex NOT DETECTED NOT DETECTED Final   Escherichia coli NOT DETECTED NOT DETECTED Final   Klebsiella oxytoca NOT DETECTED NOT DETECTED Final   Klebsiella pneumoniae NOT DETECTED NOT DETECTED Final   Proteus species NOT DETECTED NOT DETECTED Final   Serratia marcescens NOT DETECTED NOT DETECTED Final   Haemophilus influenzae NOT DETECTED NOT DETECTED Final   Neisseria meningitidis NOT DETECTED NOT DETECTED Final   Pseudomonas aeruginosa NOT DETECTED NOT DETECTED Final   Candida albicans NOT  DETECTED NOT DETECTED Final   Candida glabrata NOT DETECTED NOT DETECTED Final   Candida krusei NOT DETECTED NOT DETECTED Final   Candida parapsilosis NOT DETECTED NOT DETECTED Final   Candida tropicalis NOT DETECTED NOT DETECTED Final    Comment: Performed at [facility], [place] [address] [place], [place] [address]  SARS Coronavirus 2 by RT PCR (hospital order, performed in [facility] hospital lab) Nasopharyngeal Nasopharyngeal Swab     Status: None   Collection Time: 02/08/20  8:48 PM   Specimen: Nasopharyngeal Swab  Result Value Ref Range Status   SARS Coronavirus 2 NEGATIVE NEGATIVE Final    Comment: (NOTE) SARS-CoV-2 target nucleic acids are NOT DETECTED.  The SARS-CoV-2 RNA is generally detectable in upper and lower respiratory specimens during the acute phase of infection. The lowest concentration of SARS-CoV-2 viral copies this assay can detect is 250 copies / mL. A negative result does not preclude SARS-CoV-2 infection and should not be used as the sole basis for treatment or other patient management decisions.  A negative result may occur with improper specimen collection / handling, submission of specimen other than nasopharyngeal swab, presence of viral mutation(s) within the areas targeted by this assay, and inadequate number of viral copies (<250 copies / mL). A negative result must be combined with clinical observations, patient history, and epidemiological information.  Fact Sheet for Patients:   StrictlyIdeas.no  Fact Sheet for Healthcare Providers: BankingDealers.co.za  This test is not yet approved or  cleared by the [place] FDA and has been authorized for detection and/or diagnosis of SARS-CoV-2 by FDA under an Emergency Use Authorization (EUA).  This EUA will remain in effect (meaning this test can be used) for the duration of the COVID-19 declaration under Section 564(b)(1) of the Act, 21  U.S.C. section 360bbb-3(b)(1), unless the authorization is terminated or revoked sooner.  Performed at [facility], [place] [address], [place], [place] [address]   MRSA PCR Screening     Status: None   Collection Time: 02/09/20 11:32 AM  Result Value Ref Range Status   MRSA by PCR NEGATIVE NEGATIVE Final    Comment:        The GeneXpert MRSA Assay (FDA approved for NASAL specimens only), is one component of a comprehensive MRSA colonization surveillance program. It is not intended to diagnose MRSA infection nor to guide or monitor treatment for MRSA infections. Performed at [facility], [place] [address], [place], [place] [address]   Respiratory Panel by PCR     Status: None   Collection Time: 02/09/20  5:30 PM   Specimen: Nasopharyngeal Swab; Respiratory  Result Value Ref Range Status   Adenovirus NOT DETECTED NOT DETECTED Final   Coronavirus 229E NOT  DETECTED NOT DETECTED Final    Comment: (NOTE) The Coronavirus on the Respiratory Panel, DOES NOT test for the novel  Coronavirus (2019 nCoV)    Coronavirus HKU1 NOT DETECTED NOT DETECTED Final   Coronavirus NL63 NOT DETECTED NOT DETECTED Final   Coronavirus OC43 NOT DETECTED NOT DETECTED Final   Metapneumovirus NOT DETECTED NOT DETECTED Final   Rhinovirus / Enterovirus NOT DETECTED NOT DETECTED Final   Influenza A NOT DETECTED NOT DETECTED Final   Influenza B NOT DETECTED NOT DETECTED Final   Parainfluenza Virus 1 NOT DETECTED NOT DETECTED Final   Parainfluenza Virus 2 NOT DETECTED NOT DETECTED Final   Parainfluenza Virus 3 NOT DETECTED NOT DETECTED Final   Parainfluenza Virus 4 NOT DETECTED NOT DETECTED Final   Respiratory Syncytial Virus NOT DETECTED NOT DETECTED Final   Bordetella pertussis NOT DETECTED NOT DETECTED Final   Chlamydophila pneumoniae NOT DETECTED NOT DETECTED Final   Mycoplasma pneumoniae NOT DETECTED NOT DETECTED Final    Comment: Performed at [facility], [place] [address],  [place], [place] [address]  Culture, blood (routine x 2)     Status: None (Preliminary result)   Collection Time: 02/10/20  9:02 AM   Specimen: BLOOD  Result Value Ref Range Status   Specimen Description BLOOD LEFT ANTECUBITAL  Final   Special Requests   Final    BOTTLES DRAWN AEROBIC AND ANAEROBIC Blood Culture adequate volume   Culture   Final    NO GROWTH 4 DAYS Performed at [facility], [place] [address], [place], [place] [address]    Report Status PENDING  Incomplete  Culture, blood (routine x 2)     Status: None (Preliminary result)   Collection Time: 02/10/20  9:05 AM   Specimen: BLOOD LEFT HAND  Result Value Ref Range Status   Specimen Description BLOOD LEFT HAND  Final   Special Requests   Final    BOTTLES DRAWN AEROBIC AND ANAEROBIC Blood Culture adequate volume   Culture   Final    NO GROWTH 4 DAYS Performed at [facility], [place] [address] [place],  [address]    Report Status PENDING  Incomplete    RADIOLOGY STUDIES/RESULTS: DG Orthopantogram  Result Date: 02/13/2020 CLINICAL DATA:  Bacteremia, question source EXAM: ORTHOPANTOGRAM/PANORAMIC COMPARISON:  None. FINDINGS: No evidence of periapical abscess. No acute bony abnormality. Visualized sinuses appear clear. IMPRESSION: No acute findings. Electronically Signed   By: Rolm Baptise M.D.   On: 02/13/2020 11:46   ECHO TEE  Result Date: 02/13/2020    TRANSESOPHOGEAL ECHO REPORT   Patient Name:   ELIZABET SCHWEPPE Date of Exam: 02/13/2020 Medical Rec #:  [id number]         Height:       63.0 in Accession #:    [id number]        Weight:       132.5 lb Date of Birth:  11/25/1975        BSA:          1.623 m Patient Age:    51 years          BP:           204/78 mmHg Patient Gender: F                 HR:           68 bpm. Exam Location:  Inpatient Procedure: Transesophageal Echo, Cardiac Doppler and Color Doppler Indications:  Bacteremia  History:         Patient has prior history of Echocardiogram examinations,  most                  recent 02/11/2020. Risk Factors:Hypertension, Diabetes,                  Dyslipidemia and Non-Smoker. GERD. PVD.  Sonographer:     Vickie Epley RDCS Referring Phys:  [id number] Darreld Mclean Diagnosing Phys: Jenkins Rouge MD PROCEDURE: The transesophogeal probe was passed without difficulty through the esophogus of the patient. Local oropharyngeal anesthetic was provided with Cetacaine. Sedation performed by different physician. The patient was monitored while under deep sedation. Anesthestetic sedation was provided intravenously by Anesthesiology: 154.86mg  of Propofol, 20mg  of Lidocaine. The patient's vital signs; including heart rate, blood pressure, and oxygen saturation; remained stable throughout the procedure. The patient developed no complications during the procedure. IMPRESSIONS  1. No SBE or vegetations seen.  2. Left ventricular ejection fraction, by estimation, is 60 to 65%. The left ventricle has normal function. The left ventricle has no regional wall motion abnormalities.  3. Right ventricular systolic function is severely reduced. The right ventricular size is severely enlarged.  4. Left atrial size was mildly dilated. No left atrial/left atrial appendage thrombus was detected.  5. Right atrial size was severely dilated.  6. The mitral valve is normal in structure. No evidence of mitral valve regurgitation. No evidence of mitral stenosis.  7. Tricuspid valve regurgitation is severe.  8. The aortic valve is normal in structure. Aortic valve regurgitation is not visualized. No aortic stenosis is present.  9. The inferior vena cava is normal in size with greater than 50% respiratory variability, suggesting right atrial pressure of 3 mmHg. Conclusion(s)/Recommendation(s): Normal biventricular function without evidence of hemodynamically significant valvular heart disease. FINDINGS  Left Ventricle: Left ventricular ejection fraction, by estimation, is 60 to 65%. The left ventricle has  normal function. The left ventricle has no regional wall motion abnormalities. The left ventricular internal cavity size was normal in size. There is  no left ventricular hypertrophy. Right Ventricle: The right ventricular size is severely enlarged. Right vetricular wall thickness was not assessed. Right ventricular systolic function is severely reduced. Left Atrium: Left atrial size was mildly dilated. No left atrial/left atrial appendage thrombus was detected. Right Atrium: Right atrial size was severely dilated. Pericardium: There is no evidence of pericardial effusion. Mitral Valve: The mitral valve is normal in structure. Normal mobility of the mitral valve leaflets. No evidence of mitral valve regurgitation. No evidence of mitral valve stenosis. Tricuspid Valve: The tricuspid valve is normal in structure. Tricuspid valve regurgitation is severe. No evidence of tricuspid stenosis. Aortic Valve: The aortic valve is normal in structure. Aortic valve regurgitation is not visualized. No aortic stenosis is present. Pulmonic Valve: The pulmonic valve was normal in structure. Pulmonic valve regurgitation is mild. No evidence of pulmonic stenosis. Aorta: The aortic root is normal in size and structure. Venous: The inferior vena cava is normal in size with greater than 50% respiratory variability, suggesting right atrial pressure of 3 mmHg. IAS/Shunts: No atrial level shunt detected by color flow Doppler. Additional Comments: No SBE or vegetations seen. Jenkins Rouge MD Electronically signed by Jenkins Rouge MD Signature Date/Time: 02/13/2020/2:31:37 PM    Final      LOS: 5 days   Oren Binet, MD  [facility]    To contact the attending provider between 7A-7P or the covering provider during after  hours 7P-7A, please log into the web site [email or web address] and access using universal [place] password for that web site. If you do not have the password, please call the hospital operator.  02/14/2020, 1:25  PM

## 2020-02-14 NOTE — Progress Notes (Signed)
Subjective: No complaints   Antibiotics:  Anti-infectives (From admission, onward)   Start     Dose/Rate Route Frequency Ordered Stop   02/14/20 1800  ceFAZolin (ANCEF) IVPB 2g/100 mL premix     Discontinue     2 g 200 mL/hr over 30 Minutes Intravenous Every T-Th-Sa (1800) 02/13/20 1104     02/09/20 1800  cefTRIAXone (ROCEPHIN) 1 g in sodium chloride 0.9 % 100 mL IVPB  Status:  Discontinued        1 g 200 mL/hr over 30 Minutes Intravenous Every 24 hours 02/08/20 2311 02/09/20 1137   02/09/20 1800  doxycycline (VIBRAMYCIN) 100 mg in sodium chloride 0.9 % 250 mL IVPB  Status:  Discontinued        100 mg 125 mL/hr over 120 Minutes Intravenous Every 12 hours 02/08/20 2311 02/09/20 1137   02/09/20 1300  cefTRIAXone (ROCEPHIN) 2 g in sodium chloride 0.9 % 100 mL IVPB        2 g 200 mL/hr over 30 Minutes Intravenous Every 24 hours 02/09/20 1137 02/14/20 1259   02/08/20 2030  cefTRIAXone (ROCEPHIN) 1 g in sodium chloride 0.9 % 100 mL IVPB        1 g 200 mL/hr over 30 Minutes Intravenous  Once 02/08/20 2027 02/08/20 2205   02/08/20 2030  azithromycin (ZITHROMAX) 500 mg in sodium chloride 0.9 % 250 mL IVPB        500 mg 250 mL/hr over 60 Minutes Intravenous  Once 02/08/20 2027 02/08/20 2346      Medications: Scheduled Meds:  amLODipine  10 mg Oral QHS   aspirin EC  81 mg Oral QHS   calcium acetate  667 mg Oral TID WC   And   calcium acetate  667 mg Oral With snacks   Chlorhexidine Gluconate Cloth  6 each Topical Q0600   darbepoetin (ARANESP) injection - DIALYSIS  100 mcg Intravenous Q Tue-HD   doxercalciferol  1 mcg Intravenous Q T,Th,Sa-HD   heparin  5,000 Units Subcutaneous Q8H   insulin pump   Subcutaneous TID WC, HS, 0200   labetalol  300 mg Oral QPM   losartan  25 mg Oral BID   multivitamin with minerals  1 tablet Oral q AM   rosuvastatin  10 mg Oral QHS   Continuous Infusions:   ceFAZolin (ANCEF) IV     PRN Meds:.acetaminophen, loratadine,  ondansetron (ZOFRAN) IV    Objective: Weight change: 0 kg  Intake/Output Summary (Last 24 hours) at 02/14/2020 1515 Last data filed at 02/14/2020 1100 Gross per 24 hour  Intake --  Output 1913 ml  Net -1913 ml   Blood pressure (!) 176/79, pulse 73, temperature 97.8 F (36.6 C), resp. rate 13, height 5\' 3"  (1.6 m), weight 55.5 kg, SpO2 100 %. Temp:  [97.8 F (36.6 C)-98.2 F (36.8 C)] 97.8 F (36.6 C) (07/27 1241) Pulse Rate:  [59-75] 73 (07/27 1241) Resp:  [13-17] 13 (07/27 1241) BP: (148-178)/(46-91) 176/79 (07/27 1241) SpO2:  [97 %-100 %] 100 % (07/27 1241) Weight:  [55.5 kg-58 kg] 55.5 kg (07/27 1100)  Physical Exam: General: Alert and awake, oriented x3, not in any acute distress  CV RRR Pulm no wheezes or resp distress Neuro: nonfocal  CBC:    BMET Recent Labs    02/13/20 1355 02/14/20 0116  NA 137 136  K 3.7 4.2  CL 96* 95*  CO2  --  27  GLUCOSE 99 176*  BUN 30* 31*  CREATININE 6.10* 6.29*  CALCIUM  --  9.5     Liver Panel  Recent Labs    02/14/20 0116  ALBUMIN 2.8*       Sedimentation Rate No results for input(s): ESRSEDRATE in the last 72 hours. C-Reactive Protein No results for input(s): CRP in the last 72 hours.  Micro Results: Recent Results (from the past 720 hour(s))  Culture, blood (routine x 2)     Status: Abnormal   Collection Time: 02/08/20  6:04 PM   Specimen: BLOOD  Result Value Ref Range Status   Specimen Description BLOOD SITE NOT SPECIFIED  Final   Special Requests   Final    BOTTLES DRAWN AEROBIC AND ANAEROBIC Blood Culture results may not be optimal due to an inadequate volume of blood received in culture bottles   Culture  Setup Time   Final    GRAM POSITIVE COCCI IN CHAINS IN BOTH AEROBIC AND ANAEROBIC BOTTLES CRITICAL VALUE NOTED.  VALUE IS CONSISTENT WITH PREVIOUSLY REPORTED AND CALLED VALUE.    Culture (A)  Final    STREPTOCOCCUS VESTIBULARIS SUSCEPTIBILITIES PERFORMED ON PREVIOUS CULTURE WITHIN THE LAST 5  DAYS. Performed at [facility], [place] [address], [place], [place] [address]    Report Status 02/11/2020 FINAL  Final  Culture, blood (routine x 2)     Status: Abnormal   Collection Time: 02/08/20  6:15 PM   Specimen: BLOOD  Result Value Ref Range Status   Specimen Description BLOOD LEFT ANTECUBITAL  Final   Special Requests   Final    BOTTLES DRAWN AEROBIC AND ANAEROBIC Blood Culture adequate volume   Culture  Setup Time   Final    GRAM POSITIVE COCCI IN CHAINS IN BOTH AEROBIC AND ANAEROBIC BOTTLES CRITICAL RESULT CALLED TO, READ BACK BY AND VERIFIED WITH: Park Breed [id number] FCP Performed at [facility], [place] [address] [place], [place] [address]    Culture STREPTOCOCCUS VESTIBULARIS (A)  Final   Report Status 02/11/2020 FINAL  Final   Organism ID, Bacteria STREPTOCOCCUS VESTIBULARIS  Final      Susceptibility   Streptococcus vestibularis - MIC*    TETRACYCLINE 0.5 SENSITIVE Sensitive     VANCOMYCIN 0.5 SENSITIVE Sensitive     CLINDAMYCIN <=0.25 SENSITIVE Sensitive     PENICILLIN Value in next row Sensitive      SENSITIVE0.12    CEFTRIAXONE Value in next row Sensitive      SENSITIVE0.12    * STREPTOCOCCUS VESTIBULARIS  Blood Culture ID Panel (Reflexed)     Status: Abnormal   Collection Time: 02/08/20  6:15 PM  Result Value Ref Range Status   Enterococcus species NOT DETECTED NOT DETECTED Final   Listeria monocytogenes NOT DETECTED NOT DETECTED Final   Staphylococcus species NOT DETECTED NOT DETECTED Final   Staphylococcus aureus (BCID) NOT DETECTED NOT DETECTED Final   Streptococcus species DETECTED (A) NOT DETECTED Final    Comment: Not Enterococcus species, Streptococcus agalactiae, Streptococcus pyogenes, or Streptococcus pneumoniae. CRITICAL RESULT CALLED TO, READ BACK BY AND VERIFIED WITH: PHARMD JEREMY F. [phone] [id number] FCP    Streptococcus agalactiae NOT DETECTED NOT DETECTED Final   Streptococcus pneumoniae NOT DETECTED NOT DETECTED Final    Streptococcus pyogenes NOT DETECTED NOT DETECTED Final   Acinetobacter baumannii NOT DETECTED NOT DETECTED Final   Enterobacteriaceae species NOT DETECTED NOT DETECTED Final   Enterobacter cloacae complex NOT DETECTED NOT DETECTED Final   Escherichia coli NOT DETECTED NOT DETECTED Final   Klebsiella oxytoca  NOT DETECTED NOT DETECTED Final   Klebsiella pneumoniae NOT DETECTED NOT DETECTED Final   Proteus species NOT DETECTED NOT DETECTED Final   Serratia marcescens NOT DETECTED NOT DETECTED Final   Haemophilus influenzae NOT DETECTED NOT DETECTED Final   Neisseria meningitidis NOT DETECTED NOT DETECTED Final   Pseudomonas aeruginosa NOT DETECTED NOT DETECTED Final   Candida albicans NOT DETECTED NOT DETECTED Final   Candida glabrata NOT DETECTED NOT DETECTED Final   Candida krusei NOT DETECTED NOT DETECTED Final   Candida parapsilosis NOT DETECTED NOT DETECTED Final   Candida tropicalis NOT DETECTED NOT DETECTED Final    Comment: Performed at [facility], [place] [address], [place], [place] [address]  SARS Coronavirus 2 by RT PCR (hospital order, performed in [facility] hospital lab) Nasopharyngeal Nasopharyngeal Swab     Status: None   Collection Time: 02/08/20  8:48 PM   Specimen: Nasopharyngeal Swab  Result Value Ref Range Status   SARS Coronavirus 2 NEGATIVE NEGATIVE Final    Comment: (NOTE) SARS-CoV-2 target nucleic acids are NOT DETECTED.  The SARS-CoV-2 RNA is generally detectable in upper and lower respiratory specimens during the acute phase of infection. The lowest concentration of SARS-CoV-2 viral copies this assay can detect is 250 copies / mL. A negative result does not preclude SARS-CoV-2 infection and should not be used as the sole basis for treatment or other patient management decisions.  A negative result may occur with improper specimen collection / handling, submission of specimen other than nasopharyngeal swab, presence of viral mutation(s) within the areas  targeted by this assay, and inadequate number of viral copies (<250 copies / mL). A negative result must be combined with clinical observations, patient history, and epidemiological information.  Fact Sheet for Patients:   StrictlyIdeas.no  Fact Sheet for Healthcare Providers: BankingDealers.co.za  This test is not yet approved or  cleared by the [place] FDA and has been authorized for detection and/or diagnosis of SARS-CoV-2 by FDA under an Emergency Use Authorization (EUA).  This EUA will remain in effect (meaning this test can be used) for the duration of the COVID-19 declaration under Section 564(b)(1) of the Act, 21 U.S.C. section 360bbb-3(b)(1), unless the authorization is terminated or revoked sooner.  Performed at [facility], [place] [address] [place], [place] [address]   MRSA PCR Screening     Status: None   Collection Time: 02/09/20 11:32 AM  Result Value Ref Range Status   MRSA by PCR NEGATIVE NEGATIVE Final    Comment:        The GeneXpert MRSA Assay (FDA approved for NASAL specimens only), is one component of a comprehensive MRSA colonization surveillance program. It is not intended to diagnose MRSA infection nor to guide or monitor treatment for MRSA infections. Performed at [facility], [place] [address] [place], [place] [address]   Respiratory Panel by PCR     Status: None   Collection Time: 02/09/20  5:30 PM   Specimen: Nasopharyngeal Swab; Respiratory  Result Value Ref Range Status   Adenovirus NOT DETECTED NOT DETECTED Final   Coronavirus 229E NOT DETECTED NOT DETECTED Final    Comment: (NOTE) The Coronavirus on the Respiratory Panel, DOES NOT test for the novel  Coronavirus (2019 nCoV)    Coronavirus HKU1 NOT DETECTED NOT DETECTED Final   Coronavirus NL63 NOT DETECTED NOT DETECTED Final   Coronavirus OC43 NOT DETECTED NOT DETECTED Final   Metapneumovirus NOT DETECTED NOT DETECTED Final  Rhinovirus / Enterovirus NOT DETECTED NOT DETECTED Final   Influenza A NOT DETECTED NOT DETECTED Final   Influenza B NOT DETECTED NOT DETECTED Final   Parainfluenza Virus 1 NOT DETECTED NOT DETECTED Final   Parainfluenza Virus 2 NOT DETECTED NOT DETECTED Final   Parainfluenza Virus 3 NOT DETECTED NOT DETECTED Final   Parainfluenza Virus 4 NOT DETECTED NOT DETECTED Final   Respiratory Syncytial Virus NOT DETECTED NOT DETECTED Final   Bordetella pertussis NOT DETECTED NOT DETECTED Final   Chlamydophila pneumoniae NOT DETECTED NOT DETECTED Final   Mycoplasma pneumoniae NOT DETECTED NOT DETECTED Final    Comment: Performed at [facility], [place] [address], [place], [place] [address]  Culture, blood (routine x 2)     Status: None (Preliminary result)   Collection Time: 02/10/20  9:02 AM   Specimen: BLOOD  Result Value Ref Range Status   Specimen Description BLOOD LEFT ANTECUBITAL  Final   Special Requests   Final    BOTTLES DRAWN AEROBIC AND ANAEROBIC Blood Culture adequate volume   Culture   Final    NO GROWTH 4 DAYS Performed at [facility], [place] [address], [place], [place] [address]    Report Status PENDING  Incomplete  Culture, blood (routine x 2)     Status: None (Preliminary result)   Collection Time: 02/10/20  9:05 AM   Specimen: BLOOD LEFT HAND  Result Value Ref Range Status   Specimen Description BLOOD LEFT HAND  Final   Special Requests   Final    BOTTLES DRAWN AEROBIC AND ANAEROBIC Blood Culture adequate volume   Culture   Final    NO GROWTH 4 DAYS Performed at [facility], [place] [address], [place], [place] [address]    Report Status PENDING  Incomplete    Studies/Results: DG Orthopantogram  Result Date: 02/13/2020 CLINICAL DATA:  Bacteremia, question source EXAM: ORTHOPANTOGRAM/PANORAMIC COMPARISON:  None. FINDINGS: No evidence of periapical abscess. No acute bony abnormality. Visualized sinuses appear clear. IMPRESSION: No acute findings.  Electronically Signed   By: Rolm Baptise M.D.   On: 02/13/2020 11:46   ECHO TEE  Result Date: 02/13/2020    TRANSESOPHOGEAL ECHO REPORT   Patient Name:   [name] Date of Exam: 02/13/2020 Medical Rec #:  [id number]         Height:       63.0 in Accession #:    [id number]        Weight:       132.5 lb Date of Birth:  1975/10/13        BSA:          1.623 m Patient Age:    44 years          BP:           204/78 mmHg Patient Gender: F                 HR:           68 bpm. Exam Location:  Inpatient Procedure: Transesophageal Echo, Cardiac Doppler and Color Doppler Indications:     Bacteremia  History:         Patient has prior history of Echocardiogram examinations, most                  recent 02/11/2020. Risk Factors:Hypertension, Diabetes,                  Dyslipidemia and Non-Smoker. GERD. PVD.  Sonographer:     Vickie Epley RDCS Referring Phys:  [id number] Darreld Mclean Diagnosing Phys: Jenkins Rouge MD PROCEDURE: The transesophogeal probe was passed without difficulty through the esophogus of the patient. Local oropharyngeal anesthetic was provided with Cetacaine. Sedation performed by different physician. The patient was monitored while under deep sedation. Anesthestetic sedation was provided intravenously by Anesthesiology: 154.86mg  of Propofol, 20mg  of Lidocaine. The patient's vital signs; including heart rate, blood pressure, and oxygen saturation; remained stable throughout the procedure. The patient developed no complications during the procedure. IMPRESSIONS  1. No SBE or vegetations seen.  2. Left ventricular ejection fraction, by estimation, is 60 to 65%. The left ventricle has normal function. The left ventricle has no regional wall motion abnormalities.  3. Right ventricular systolic function is severely reduced. The right ventricular size is severely enlarged.  4. Left atrial size was mildly dilated. No left atrial/left atrial appendage thrombus was detected.  5. Right atrial size was  severely dilated.  6. The mitral valve is normal in structure. No evidence of mitral valve regurgitation. No evidence of mitral stenosis.  7. Tricuspid valve regurgitation is severe.  8. The aortic valve is normal in structure. Aortic valve regurgitation is not visualized. No aortic stenosis is present.  9. The inferior vena cava is normal in size with greater than 50% respiratory variability, suggesting right atrial pressure of 3 mmHg. Conclusion(s)/Recommendation(s): Normal biventricular function without evidence of hemodynamically significant valvular heart disease. FINDINGS  Left Ventricle: Left ventricular ejection fraction, by estimation, is 60 to 65%. The left ventricle has normal function. The left ventricle has no regional wall motion abnormalities. The left ventricular internal cavity size was normal in size. There is  no left ventricular hypertrophy. Right Ventricle: The right ventricular size is severely enlarged. Right vetricular wall thickness was not assessed. Right ventricular systolic function is severely reduced. Left Atrium: Left atrial size was mildly dilated. No left atrial/left atrial appendage thrombus was detected. Right Atrium: Right atrial size was severely dilated. Pericardium: There is no evidence of pericardial effusion. Mitral Valve: The mitral valve is normal in structure. Normal mobility of the mitral valve leaflets. No evidence of mitral valve regurgitation. No evidence of mitral valve stenosis. Tricuspid Valve: The tricuspid valve is normal in structure. Tricuspid valve regurgitation is severe. No evidence of tricuspid stenosis. Aortic Valve: The aortic valve is normal in structure. Aortic valve regurgitation is not visualized. No aortic stenosis is present. Pulmonic Valve: The pulmonic valve was normal in structure. Pulmonic valve regurgitation is mild. No evidence of pulmonic stenosis. Aorta: The aortic root is normal in size and structure. Venous: The inferior vena cava is normal  in size with greater than 50% respiratory variability, suggesting right atrial pressure of 3 mmHg. IAS/Shunts: No atrial level shunt detected by color flow Doppler. Additional Comments: No SBE or vegetations seen. Jenkins Rouge MD Electronically signed by Jenkins Rouge MD Signature Date/Time: 02/13/2020/2:31:37 PM    Final       Assessment/Plan:  INTERVAL HISTORY:  For TEE today   Principal Problem:   Streptococcal bacteremia Active Problems:   Cerebral infarction ([place])   Essential hypertension, benign   HLD (hyperlipidemia)   Pneumonia   ESRD on hemodialysis ([place])   Diabetes mellitus type I ([place])   Hypoglycemia   Hypothermia    [name] is a 44 y.o. female with end-stage renal disease on hemodialysis admitted with fevers, found to have a right middle lobe pneumonia, then found to have Streptococcus vestibularis bacteremia.  #  1 Streptococcus vestibularis bacteremia: TEE is clean. Panorex without signficant pathology  Will change to ancef that she can get with HD to finish 2 weeks of IV abx  Stop date is  August 6th   [name] has an appointment on 03/21/2020 with Janene Madeira at 63 PM  The [facility] is located in the [facility] at  [address] in [place].  [address], which is located to the left of the elevators.  Phone: [phone]  Fax: [phone]  [email or web address]  She should arrive 15 minutes prior to her appt.    LOS: 5 days   [name] 02/14/2020, 3:15 PM

## 2020-02-15 ENCOUNTER — Telehealth: Payer: Self-pay | Admitting: Cardiology

## 2020-02-15 DIAGNOSIS — R7881 Bacteremia: Secondary | ICD-10-CM | POA: Diagnosis not present

## 2020-02-15 DIAGNOSIS — E1022 Type 1 diabetes mellitus with diabetic chronic kidney disease: Secondary | ICD-10-CM | POA: Diagnosis not present

## 2020-02-15 DIAGNOSIS — E1029 Type 1 diabetes mellitus with other diabetic kidney complication: Secondary | ICD-10-CM | POA: Diagnosis not present

## 2020-02-15 DIAGNOSIS — E162 Hypoglycemia, unspecified: Secondary | ICD-10-CM | POA: Diagnosis not present

## 2020-02-15 DIAGNOSIS — Z992 Dependence on renal dialysis: Secondary | ICD-10-CM | POA: Diagnosis not present

## 2020-02-15 DIAGNOSIS — N186 End stage renal disease: Secondary | ICD-10-CM | POA: Diagnosis not present

## 2020-02-15 LAB — GLUCOSE, CAPILLARY
Glucose-Capillary: 120 mg/dL — ABNORMAL HIGH (ref 70–99)
Glucose-Capillary: 104 mg/dL — ABNORMAL HIGH (ref 70–99)
Glucose-Capillary: 64 mg/dL — ABNORMAL LOW (ref 70–99)

## 2020-02-15 MED ORDER — CEFAZOLIN SODIUM-DEXTROSE 2-4 GM/100ML-% IV SOLN: 2.0000 g | INTRAVENOUS | Status: AC

## 2020-02-15 NOTE — Care Management Important Message (Signed)
Important Message  Patient Details  Name: [name] MRN: [id number] Date of Birth: 02/09/1976   Medicare Important Message Given:  Yes     Orbie Pyo 02/15/2020, 2:48 PM

## 2020-02-15 NOTE — Discharge Summary (Addendum)
PATIENT DETAILS Name: [name] Age: 44 y.o. Sex: female Date of Birth: 08/07/1975 MRN: [id number]. Admitting Physician: Antonieta Pert, MD [id number], Iona Beard, MD  Admit Date: 02/08/2020 Discharge date: 02/15/2020  Recommendations for Outpatient Follow-up:  1. Follow up with PCP in 1-2 weeks 2. Please obtain CMP/CBC in one week 3. Please ensure follow-up with endocrinology for further optimization of her insulin pump/diabetic regimen. 4. IV Ancef with hemodialysis-stop date August 6 5. Has RV dysfunction seen on TEE-incidental finding-cardiology will follow in the outpatient setting-see below   Admitted From:  Home  Disposition: [place]: No  Equipment/Devices: None  Discharge Condition: Stable  CODE STATUS: FULL CODE  Diet recommendation:  Diet Order            Diet - low sodium heart healthy           Diet Carb Modified           Diet renal with fluid restriction Fluid restriction: 1200 mL Fluid; Room service appropriate? Yes; Fluid consistency: Thin  Diet effective now                  Brief Narrative: Patient is a 44 y.o. female with history of DM-1 on insulin pump, ESRD on HD TTS, HTN-who presented with altered mental status secondary to hypoglycemia. Upon further evaluation in the emergency room-chest x-ray was consistent with pneumonia-subsequent blood cultures positive  streptococcal vestibularis. See Below for further details.  Note-not vaccinated against Covid-COVID-19 PCR negative on admission  Significant events: 7/21>> admit for unresponsiveness-hypoglycemia/hypothermia. Chest x-ray positive for pneumonia, blood cultures positive for streptococcal vestibularis bacteremia.  Significant studies: 7/26>> TEE: No vegetation seen, RV systolic function is severely reduced, severe TR 7/24>>Echo: EF 33-00%, grade 2 diastolic dysfunction 7/62>> chest x-ray:. Progressive infiltrate right mid lung consistent with progression of  pneumonia. 7/21>> chest x-ray:  airspace disease right midlung-consistent with bronchopneumonia  Antimicrobial therapy: Ancef: 7/27 with HD (TTS) Rocephin: 7/21>>7/26 Zithromax: 7/21 x 1  Microbiology data: 7/23>> blood culture: neg so far 7/21>> blood culture: Strep Vestibularis 7/21>>SARS Coronavirus 2: Negative  Procedures : None  Consults: Nephrology ID  Brief Hospital Course: Acute metabolic encephalopathy secondary to hypoglycemia: Resolved-she is completely awake and alert.   Hypoglycemia-history of DM-1 on insulin pump (A1c 6.2 on 02/08/2020): Claims she woke up in an ambulance on the day of admission-and was told she was hypoglycemic.  She is maintained on an insulin pump-Per patient-she ran out of her monitoring device a few days back that notifies her of episodes of hypoglycemia.  Given streptococcal bacteremia-suspect she had poor oral intake that likely contributed to hypoglycemic episode on presentation.  Appears to have very brittle diabetes-with both hypoglycemic and hyperglycemic episodes on the same day-she was managed with Lantus and SSI-but continued to have both hypo and hyperglycemic episodes.  After discussion with diabetic coordinator-who discussed with her primary endocrinologist office-she was switched to a insulin pump on 7/27-some adjustments were made-she has done well overnight-with no major episodes of hypoglycemia.  It is felt at this time that she is stable enough for discharge-she will follow up with her primary endocrinologist for further optimization.  Patient now has her monitoring device that alerts her for hypoglycemic episodes as well.  She is well aware of the needed interventions in case she develops hypoglycemia.  Streptococcal vestibularis bacteremia: Initial blood cultures on admission positive for Streptococcus vestibularis-repeat blood culture on 7/23 NEG so far.  HD site without any obvious infection.  Chest x-ray  confirms PNA as well-not  sure if this is the foci for infection.  TTE negative for vegetations-no vegetation seen on TEE.  Panorex x-ray negative for infection.  Spoke with Dr. Lucianne Lei dam-recommendations at 2 weeks of Ancef with hemodialysis-stop date is August 6-ID has arranged for outpatient follow-up.  Community-acquired pneumonia: Acknowledges cough/URI symptoms for a week or so prior to this hospital stay-chest x-ray confirms PNA (repeated after HD)-on Rocephin daily-subsequently switched to Ancef.  Clinically improved.  No fever or shortness of breath.  Vomiting: Resolved.  Likely secondary to bacteremia-abdominal exam appears benign  ESRD on HD TTS: Nephrology consulted and followed closely during this hospital stay  HTN: BP relatively stable-continue labetalol, amlodipine and losartan.  Follow and adjust accordingly.    RV dysfunction-likely from pulmonary hypertension-seen on TEE-suspect incidental finding-likely chronic-she really does not have any major symptoms from what I can tell-will need outpatient follow-up with cardiology.  Anemia likely secondary to chronic disease: Hemoglobin stable-defer IV iron/Aranesp to nephrology.   Discharge Diagnoses:  Principal Problem:   Bacteremia Active Problems:   Cerebral infarction [facility])   Essential hypertension, benign   HLD (hyperlipidemia)   Pneumonia   ESRD on hemodialysis (HCC)   Diabetes mellitus type I ([place])   Hypoglycemia   Hypothermia   Discharge Instructions:  Activity:  As tolerated    Discharge Instructions    Call MD for:  difficulty breathing, headache or visual disturbances   Complete by: As directed    Call MD for:  extreme fatigue   Complete by: As directed    Call MD for:  persistant dizziness or light-headedness   Complete by: As directed    Diet - low sodium heart healthy   Complete by: As directed    Diet Carb Modified   Complete by: As directed    Discharge instructions   Complete by: As directed    Follow with Primary  MD  Benito Mccreedy, MD in 1-2 weeks  Please check your sugars as instructed by the primary endocrinologist-please reach out to their office if you have episodes of severe low blood sugars and high blood sugars.  You will get IV antibiotics for bloodstream infection with hemodialysis-stop date of August 6.  Incidental finding of right heart weakness-you will get a call from cardiologist's office-for further evaluation.  Please continue to follow with your hemodialysis center at your usual routine.  Please get a complete blood count and chemistry panel checked by your Primary MD at your next visit, and again as instructed by your Primary MD.  Get Medicines reviewed and adjusted: Please take all your medications with you for your next visit with your Primary MD  Laboratory/radiological data: Please request your Primary MD to go over all hospital tests and procedure/radiological results at the follow up, please ask your Primary MD to get all Hospital records sent to his/her office.  In some cases, they will be blood work, cultures and biopsy results pending at the time of your discharge. Please request that your primary care M.D. follows up on these results.  Also Note the following: If you experience worsening of your admission symptoms, develop shortness of breath, life threatening emergency, suicidal or homicidal thoughts you must seek medical attention immediately by calling 911 or calling your MD immediately  if symptoms less severe.  You must read complete instructions/literature along with all the possible adverse reactions/side effects for all the Medicines you take and that have been prescribed to you. Take any new Medicines after you have  completely understood and accpet all the possible adverse reactions/side effects.   Do not drive when taking Pain medications or sleeping medications (Benzodaizepines)  Do not take more than prescribed Pain, Sleep and Anxiety Medications. It is  not advisable to combine anxiety,sleep and pain medications without talking with your primary care practitioner  Special Instructions: If you have smoked or chewed Tobacco  in the last 2 yrs please stop smoking, stop any regular Alcohol  and or any Recreational drug use.  Wear Seat belts while driving.  Please note: You were cared for by a hospitalist during your hospital stay. Once you are discharged, your primary care physician will handle any further medical issues. Please note that NO REFILLS for any discharge medications will be authorized once you are discharged, as it is imperative that you return to your primary care physician (or establish a relationship with a primary care physician if you do not have one) for your post hospital discharge needs so that they can reassess your need for medications and monitor your lab values.   Increase activity slowly   Complete by: As directed      Allergies as of 02/15/2020      Reactions   Pollen Extract Other (See Comments)   Watery eyes      Medication List    TAKE these medications   amLODipine 10 MG tablet Commonly known as: NORVASC Take 10 mg by mouth at bedtime.   aspirin 81 MG EC tablet Take 81 mg by mouth at bedtime. Swallow whole.   ceFAZolin 2-4 GM/100ML-% IVPB Commonly known as: ANCEF Inject 100 mLs (2 g total) into the vein every Tuesday, Thursday, and Saturday at 6 PM for 8 days. Start taking on: February 16, 2020   Flinstones Gummies Omega-3 DHA Diona Fanti 2 tablets by mouth in the morning.   Glucagon Emergency 1 MG Kit Inject 1 mg into the muscle once as needed (for severe low blood sugar).   insulin aspart 100 UNIT/ML injection Commonly known as: novoLOG Inject 50 Units into the skin See admin instructions. Use with insulin pump daily   insulin pump Soln Inject into the skin continuous. Novolog   Basal rate 0.29 units/ hour   labetalol 300 MG tablet Commonly known as: NORMODYNE Take 300 mg by mouth every  evening.   loratadine 10 MG tablet Commonly known as: CLARITIN Take 10 mg by mouth daily as needed for allergies.   losartan 25 MG tablet Commonly known as: COZAAR Take 25 mg by mouth in the morning and at bedtime.   PhosLo 667 MG capsule Generic drug: calcium acetate Take 667 mg by mouth 3 (three) times daily with meals. May take an additional 667 mg dose with snacks   rosuvastatin 10 MG tablet Commonly known as: CRESTOR Take 10 mg by mouth at bedtime.       Follow-up Information    Osei-Bonsu, Iona Beard, MD. Schedule an appointment as soon as possible for a visit in 1 week(s).   Specialty: Internal Medicine Contact information: [address] [place] [address] [phone]        [facility] Follow up.   Why: at your usual schedule       Adrian Prows, MD Follow up.   Specialty: Cardiology Why: office will call for a follow up appointment Contact information: [address] [place] [address] [phone]              Allergies  Allergen Reactions  .  Pollen Extract Other (See Comments)    Watery eyes     Other Procedures/Studies: DG Orthopantogram  Result Date: 02/13/2020 CLINICAL DATA:  Bacteremia, question source EXAM: ORTHOPANTOGRAM/PANORAMIC COMPARISON:  None. FINDINGS: No evidence of periapical abscess. No acute bony abnormality. Visualized sinuses appear clear. IMPRESSION: No acute findings. Electronically Signed   By: Rolm Baptise M.D.   On: 02/13/2020 11:46   DG Chest Port 1 View  Result Date: 02/10/2020 CLINICAL DATA:  Shortness of breath. EXAM: PORTABLE CHEST 1 VIEW COMPARISON:  02/08/2020. FINDINGS: Mediastinum hilar structures normal. Progressive infiltrate in the right mid lung consistent with progression of pneumonia noted. Stable cardiomegaly. Tiny right pleural effusion cannot be excluded. No pneumothorax. Surgical clips left chest. IMPRESSION: 1. Progressive infiltrate right mid lung consistent with  progression of pneumonia. Tiny right pleural effusion cannot be excluded. 2.  Stable cardiomegaly. Electronically Signed   By: Marcello Moores  Register   On: 02/10/2020 07:02   DG Chest Portable 1 View  Result Date: 02/08/2020 CLINICAL DATA:  Weakness, unresponsive EXAM: PORTABLE CHEST 1 VIEW COMPARISON:  04/15/2018 FINDINGS: Single frontal view of the chest demonstrates stable enlargement of the cardiac silhouette. There is focal consolidation within the right midlung zone overlying the right anterior fourth rib. No effusion or pneumothorax. Postsurgical changes left axilla. No acute bony abnormalities. IMPRESSION: 1. Focal airspace disease right midlung zone, consistent with bronchopneumonia. Electronically Signed   By: Randa Ngo M.D.   On: 02/08/2020 19:02   ECHOCARDIOGRAM COMPLETE  Result Date: 02/11/2020    ECHOCARDIOGRAM REPORT   Patient Name:   [name] Date of Exam: 02/11/2020 Medical Rec #:  [id number]         Height:       63.0 in Accession #:    [id number]        Weight:       121.3 lb Date of Birth:  November 15, 1975        BSA:          1.563 m Patient Age:    89 years          BP:           165/68 mmHg Patient Gender: F                 HR:           76 bpm. Exam Location:  Inpatient Procedure: 2D Echo Indications:    Bacteremia 790.7 / R78.81  History:        Patient has prior history of Echocardiogram examinations, most                 recent 10/21/2019. Risk Factors:Diabetes, Dyslipidemia, Non-Smoker                 and Hypertension. ESRD, Pneumonia.  Sonographer:    Leavy Cella Referring Phys: [place]  1. Left ventricular ejection fraction, by estimation, is 55 to 60%. The left ventricle has normal function. The left ventricle has no regional wall motion abnormalities. There is mild concentric left ventricular hypertrophy. Left ventricular diastolic parameters are consistent with Grade II diastolic dysfunction (pseudonormalization).  2. Right ventricular systolic  function is normal. The right ventricular size is mildly enlarged. There is moderately elevated pulmonary artery systolic pressure.  3. Left atrial size was moderately dilated.  4. Right atrial size was moderately dilated.  5. The mitral valve is normal in structure. Trivial mitral valve regurgitation.  6. Tricuspid valve regurgitation is severe.  7. The aortic valve is tricuspid. Aortic valve regurgitation is not visualized. Mild aortic valve sclerosis is present, with no evidence of aortic valve stenosis.  8. The inferior vena cava is normal in size with <50% respiratory variability, suggesting right atrial pressure of 8 mmHg. Comparison(s): No significant change from prior study. Conclusion(s)/Recommendation(s): No evidence of valvular vegetations on this transthoracic echocardiogram. Would recommend a transesophageal echocardiogram to exclude infective endocarditis if clinically indicated. FINDINGS  Left Ventricle: Left ventricular ejection fraction, by estimation, is 55 to 60%. The left ventricle has normal function. The left ventricle has no regional wall motion abnormalities. The left ventricular internal cavity size was normal in size. There is  mild concentric left ventricular hypertrophy. Left ventricular diastolic parameters are consistent with Grade II diastolic dysfunction (pseudonormalization). Right Ventricle: The right ventricular size is mildly enlarged. Right vetricular wall thickness was not assessed. Right ventricular systolic function is normal. There is moderately elevated pulmonary artery systolic pressure. The tricuspid regurgitant velocity is 3.23 m/s, and with an assumed right atrial pressure of 8 mmHg, the estimated right ventricular systolic pressure is 22.0 mmHg. Left Atrium: Left atrial size was moderately dilated. Right Atrium: Right atrial size was moderately dilated. Pericardium: A small pericardial effusion is present. Mitral Valve: The mitral valve is normal in structure. Trivial  mitral valve regurgitation. Tricuspid Valve: The tricuspid valve is normal in structure. Tricuspid valve regurgitation is severe. Aortic Valve: The aortic valve is tricuspid. Aortic valve regurgitation is not visualized. Mild aortic valve sclerosis is present, with no evidence of aortic valve stenosis. There is mild calcification of the aortic valve. Pulmonic Valve: The pulmonic valve was grossly normal. Pulmonic valve regurgitation is mild. Aorta: The aortic root, ascending aorta and aortic arch are all structurally normal, with no evidence of dilitation or obstruction. Venous: The inferior vena cava is normal in size with less than 50% respiratory variability, suggesting right atrial pressure of 8 mmHg. IAS/Shunts: The atrial septum is grossly normal.  LEFT VENTRICLE PLAX 2D LVIDd:         4.30 cm  Diastology LVIDs:         2.90 cm  LV e' lateral:   8.70 cm/s LV PW:         1.20 cm  LV E/e' lateral: 13.2 LV IVS:        1.00 cm  LV e' medial:    7.83 cm/s LVOT diam:     1.70 cm  LV E/e' medial:  14.7 LVOT Area:     2.27 cm  RIGHT VENTRICLE RV S prime:     12.20 cm/s TAPSE (M-mode): 1.7 cm LEFT ATRIUM           Index       RIGHT ATRIUM           Index LA diam:      4.20 cm 2.69 cm/m  RA Area:     17.70 cm LA Vol (A2C): 37.1 ml 23.73 ml/m RA Volume:   63.20 ml  40.43 ml/m LA Vol (A4C): 58.4 ml 37.36 ml/m   AORTA Ao Root diam: 2.50 cm MITRAL VALVE                TRICUSPID VALVE MV Area (PHT): 2.99 cm     TR Peak grad:   41.7 mmHg MV Decel Time: 254 msec     TR Vmax:        323.00 cm/s MV E velocity: 115.00 cm/s MV A velocity: 107.00 cm/s  SHUNTS MV  E/A ratio:  1.07         Systemic Diam: 1.70 cm Buford Dresser MD Electronically signed by Buford Dresser MD Signature Date/Time: 02/11/2020/4:13:50 PM    Final    ECHO TEE  Result Date: 02/13/2020    TRANSESOPHOGEAL ECHO REPORT   Patient Name:   [name] Date of Exam: 02/13/2020 Medical Rec #:  [id number]         Height:       63.0 in  Accession #:    [id number]        Weight:       132.5 lb Date of Birth:  15-Jan-1976        BSA:          1.623 m Patient Age:    27 years          BP:           204/78 mmHg Patient Gender: F                 HR:           68 bpm. Exam Location:  Inpatient Procedure: Transesophageal Echo, Cardiac Doppler and Color Doppler Indications:     Bacteremia  History:         Patient has prior history of Echocardiogram examinations, most                  recent 02/11/2020. Risk Factors:Hypertension, Diabetes,                  Dyslipidemia and Non-Smoker. GERD. PVD.  Sonographer:     Vickie Epley RDCS Referring Phys:  [id number] Darreld Mclean Diagnosing Phys: Jenkins Rouge MD PROCEDURE: The transesophogeal probe was passed without difficulty through the esophogus of the patient. Local oropharyngeal anesthetic was provided with Cetacaine. Sedation performed by different physician. The patient was monitored while under deep sedation. Anesthestetic sedation was provided intravenously by Anesthesiology: 154.13m of Propofol, 217mof Lidocaine. The patient's vital signs; including heart rate, blood pressure, and oxygen saturation; remained stable throughout the procedure. The patient developed no complications during the procedure. IMPRESSIONS  1. No SBE or vegetations seen.  2. Left ventricular ejection fraction, by estimation, is 60 to 65%. The left ventricle has normal function. The left ventricle has no regional wall motion abnormalities.  3. Right ventricular systolic function is severely reduced. The right ventricular size is severely enlarged.  4. Left atrial size was mildly dilated. No left atrial/left atrial appendage thrombus was detected.  5. Right atrial size was severely dilated.  6. The mitral valve is normal in structure. No evidence of mitral valve regurgitation. No evidence of mitral stenosis.  7. Tricuspid valve regurgitation is severe.  8. The aortic valve is normal in structure. Aortic valve regurgitation is not  visualized. No aortic stenosis is present.  9. The inferior vena cava is normal in size with greater than 50% respiratory variability, suggesting right atrial pressure of 3 mmHg. Conclusion(s)/Recommendation(s): Normal biventricular function without evidence of hemodynamically significant valvular heart disease. FINDINGS  Left Ventricle: Left ventricular ejection fraction, by estimation, is 60 to 65%. The left ventricle has normal function. The left ventricle has no regional wall motion abnormalities. The left ventricular internal cavity size was normal in size. There is  no left ventricular hypertrophy. Right Ventricle: The right ventricular size is severely enlarged. Right vetricular wall thickness was not assessed. Right ventricular systolic function is severely reduced. Left Atrium: Left atrial size was mildly dilated. No  left atrial/left atrial appendage thrombus was detected. Right Atrium: Right atrial size was severely dilated. Pericardium: There is no evidence of pericardial effusion. Mitral Valve: The mitral valve is normal in structure. Normal mobility of the mitral valve leaflets. No evidence of mitral valve regurgitation. No evidence of mitral valve stenosis. Tricuspid Valve: The tricuspid valve is normal in structure. Tricuspid valve regurgitation is severe. No evidence of tricuspid stenosis. Aortic Valve: The aortic valve is normal in structure. Aortic valve regurgitation is not visualized. No aortic stenosis is present. Pulmonic Valve: The pulmonic valve was normal in structure. Pulmonic valve regurgitation is mild. No evidence of pulmonic stenosis. Aorta: The aortic root is normal in size and structure. Venous: The inferior vena cava is normal in size with greater than 50% respiratory variability, suggesting right atrial pressure of 3 mmHg. IAS/Shunts: No atrial level shunt detected by color flow Doppler. Additional Comments: No SBE or vegetations seen. Jenkins Rouge MD Electronically signed by Jenkins Rouge MD Signature Date/Time: 02/13/2020/2:31:37 PM    Final      TODAY-DAY OF DISCHARGE:  Subjective:   [name] today has no headache,no chest abdominal pain,no new weakness tingling or numbness, feels much better wants to go home today.   Objective:   Blood pressure (!) 156/71, pulse 71, temperature 98.6 F (37 C), temperature source Oral, resp. rate 13, height '5\' 3"'$  (1.6 m), weight 55.5 kg, SpO2 93 %.  Intake/Output Summary (Last 24 hours) at 02/15/2020 0958 Last data filed at 02/14/2020 1100 Gross per 24 hour  Intake --  Output 1913 ml  Net -1913 ml   Filed Weights   02/13/20 1316 02/14/20 0725 02/14/20 1100  Weight: 60.1 kg 58 kg 55.5 kg    Exam: Awake Alert, Oriented *3, No new F.N deficits, Normal affect [place].AT,PERRAL Supple Neck,No JVD, No cervical lymphadenopathy appriciated.  Symmetrical Chest wall movement, Good air movement bilaterally, CTAB RRR,No Gallops,Rubs or new Murmurs, No Parasternal Heave +ve B.Sounds, Abd Soft, Non tender, No organomegaly appriciated, No rebound -guarding or rigidity. No Cyanosis, Clubbing or edema, No new Rash or bruise   PERTINENT RADIOLOGIC STUDIES: DG Orthopantogram  Result Date: 02/13/2020 CLINICAL DATA:  Bacteremia, question source EXAM: ORTHOPANTOGRAM/PANORAMIC COMPARISON:  None. FINDINGS: No evidence of periapical abscess. No acute bony abnormality. Visualized sinuses appear clear. IMPRESSION: No acute findings. Electronically Signed   By: Rolm Baptise M.D.   On: 02/13/2020 11:46   ECHO TEE  Result Date: 02/13/2020    TRANSESOPHOGEAL ECHO REPORT   Patient Name:   [name] Date of Exam: 02/13/2020 Medical Rec #:  [id number]         Height:       63.0 in Accession #:    [id number]        Weight:       132.5 lb Date of Birth:  Jan 23, 1976        BSA:          1.623 m Patient Age:    62 years          BP:           204/78 mmHg Patient Gender: F                 HR:           68 bpm. Exam Location:  Inpatient Procedure:  Transesophageal Echo, Cardiac Doppler and Color Doppler Indications:     Bacteremia  History:         Patient has prior history  of Echocardiogram examinations, most                  recent 02/11/2020. Risk Factors:Hypertension, Diabetes,                  Dyslipidemia and Non-Smoker. GERD. PVD.  Sonographer:     Vickie Epley RDCS Referring Phys:  [id number] Darreld Mclean Diagnosing Phys: Jenkins Rouge MD PROCEDURE: The transesophogeal probe was passed without difficulty through the esophogus of the patient. Local oropharyngeal anesthetic was provided with Cetacaine. Sedation performed by different physician. The patient was monitored while under deep sedation. Anesthestetic sedation was provided intravenously by Anesthesiology: 154.'86mg'$  of Propofol, '20mg'$  of Lidocaine. The patient's vital signs; including heart rate, blood pressure, and oxygen saturation; remained stable throughout the procedure. The patient developed no complications during the procedure. IMPRESSIONS  1. No SBE or vegetations seen.  2. Left ventricular ejection fraction, by estimation, is 60 to 65%. The left ventricle has normal function. The left ventricle has no regional wall motion abnormalities.  3. Right ventricular systolic function is severely reduced. The right ventricular size is severely enlarged.  4. Left atrial size was mildly dilated. No left atrial/left atrial appendage thrombus was detected.  5. Right atrial size was severely dilated.  6. The mitral valve is normal in structure. No evidence of mitral valve regurgitation. No evidence of mitral stenosis.  7. Tricuspid valve regurgitation is severe.  8. The aortic valve is normal in structure. Aortic valve regurgitation is not visualized. No aortic stenosis is present.  9. The inferior vena cava is normal in size with greater than 50% respiratory variability, suggesting right atrial pressure of 3 mmHg. Conclusion(s)/Recommendation(s): Normal biventricular function without evidence of  hemodynamically significant valvular heart disease. FINDINGS  Left Ventricle: Left ventricular ejection fraction, by estimation, is 60 to 65%. The left ventricle has normal function. The left ventricle has no regional wall motion abnormalities. The left ventricular internal cavity size was normal in size. There is  no left ventricular hypertrophy. Right Ventricle: The right ventricular size is severely enlarged. Right vetricular wall thickness was not assessed. Right ventricular systolic function is severely reduced. Left Atrium: Left atrial size was mildly dilated. No left atrial/left atrial appendage thrombus was detected. Right Atrium: Right atrial size was severely dilated. Pericardium: There is no evidence of pericardial effusion. Mitral Valve: The mitral valve is normal in structure. Normal mobility of the mitral valve leaflets. No evidence of mitral valve regurgitation. No evidence of mitral valve stenosis. Tricuspid Valve: The tricuspid valve is normal in structure. Tricuspid valve regurgitation is severe. No evidence of tricuspid stenosis. Aortic Valve: The aortic valve is normal in structure. Aortic valve regurgitation is not visualized. No aortic stenosis is present. Pulmonic Valve: The pulmonic valve was normal in structure. Pulmonic valve regurgitation is mild. No evidence of pulmonic stenosis. Aorta: The aortic root is normal in size and structure. Venous: The inferior vena cava is normal in size with greater than 50% respiratory variability, suggesting right atrial pressure of 3 mmHg. IAS/Shunts: No atrial level shunt detected by color flow Doppler. Additional Comments: No SBE or vegetations seen. Jenkins Rouge MD Electronically signed by Jenkins Rouge MD Signature Date/Time: 02/13/2020/2:31:37 PM    Final      PERTINENT LAB RESULTS: CBC: Recent Labs    02/13/20 1355 02/14/20 0116  WBC  --  6.7  HGB 12.2 10.1*  HCT 36.0 32.8*  PLT  --  150   CMET CMP     Component Value  Date/Time   NA  136 02/14/2020 0116   K 4.2 02/14/2020 0116   CL 95 (L) 02/14/2020 0116   CO2 27 02/14/2020 0116   GLUCOSE 176 (H) 02/14/2020 0116   BUN 31 (H) 02/14/2020 0116   CREATININE 6.29 (H) 02/14/2020 0116   CALCIUM 9.5 02/14/2020 0116   CALCIUM 10.2 12/03/2009 1101   PROT 7.5 10/21/2019 0354   ALBUMIN 2.8 (L) 02/14/2020 0116   AST 42 (H) 10/21/2019 0354   ALT 30 10/21/2019 0354   ALKPHOS 446 (H) 10/21/2019 0354   BILITOT 1.7 (H) 10/21/2019 0354   GFRNONAA 8 (L) 02/14/2020 0116   GFRAA 9 (L) 02/14/2020 0116    GFR Estimated Creatinine Clearance: 9.7 mL/min (A) (by C-G formula based on SCr of 6.29 mg/dL (H)). No results for input(s): LIPASE, AMYLASE in the last 72 hours. No results for input(s): CKTOTAL, CKMB, CKMBINDEX, TROPONINI in the last 72 hours. Invalid input(s): POCBNP No results for input(s): DDIMER in the last 72 hours. No results for input(s): HGBA1C in the last 72 hours. No results for input(s): CHOL, HDL, LDLCALC, TRIG, CHOLHDL, LDLDIRECT in the last 72 hours. No results for input(s): TSH, T4TOTAL, T3FREE, THYROIDAB in the last 72 hours.  Invalid input(s): FREET3 No results for input(s): VITAMINB12, FOLATE, FERRITIN, TIBC, IRON, RETICCTPCT in the last 72 hours. Coags: No results for input(s): INR in the last 72 hours.  Invalid input(s): PT Microbiology: Recent Results (from the past 240 hour(s))  Culture, blood (routine x 2)     Status: Abnormal   Collection Time: 02/08/20  6:04 PM   Specimen: BLOOD  Result Value Ref Range Status   Specimen Description BLOOD SITE NOT SPECIFIED  Final   Special Requests   Final    BOTTLES DRAWN AEROBIC AND ANAEROBIC Blood Culture results may not be optimal due to an inadequate volume of blood received in culture bottles   Culture  Setup Time   Final    GRAM POSITIVE COCCI IN CHAINS IN BOTH AEROBIC AND ANAEROBIC BOTTLES CRITICAL VALUE NOTED.  VALUE IS CONSISTENT WITH PREVIOUSLY REPORTED AND CALLED VALUE.    Culture (A)  Final     STREPTOCOCCUS VESTIBULARIS SUSCEPTIBILITIES PERFORMED ON PREVIOUS CULTURE WITHIN THE LAST 5 DAYS. Performed at [facility], [place] [address], [place], [place] [address]    Report Status 02/11/2020 FINAL  Final  Culture, blood (routine x 2)     Status: Abnormal   Collection Time: 02/08/20  6:15 PM   Specimen: BLOOD  Result Value Ref Range Status   Specimen Description BLOOD LEFT ANTECUBITAL  Final   Special Requests   Final    BOTTLES DRAWN AEROBIC AND ANAEROBIC Blood Culture adequate volume   Culture  Setup Time   Final    GRAM POSITIVE COCCI IN CHAINS IN BOTH AEROBIC AND ANAEROBIC BOTTLES CRITICAL RESULT CALLED TO, READ BACK BY AND VERIFIED WITH: Park Breed [id number] FCP Performed at [facility], [place] [address], [place], [place] [address]    Culture STREPTOCOCCUS VESTIBULARIS (A)  Final   Report Status 02/11/2020 FINAL  Final   Organism ID, Bacteria STREPTOCOCCUS VESTIBULARIS  Final      Susceptibility   Streptococcus vestibularis - MIC*    TETRACYCLINE 0.5 SENSITIVE Sensitive     VANCOMYCIN 0.5 SENSITIVE Sensitive     CLINDAMYCIN <=0.25 SENSITIVE Sensitive     PENICILLIN Value in next row Sensitive      SENSITIVE0.12    CEFTRIAXONE Value in next row  Sensitive      SENSITIVE0.12    * STREPTOCOCCUS VESTIBULARIS  Blood Culture ID Panel (Reflexed)     Status: Abnormal   Collection Time: 02/08/20  6:15 PM  Result Value Ref Range Status   Enterococcus species NOT DETECTED NOT DETECTED Final   Listeria monocytogenes NOT DETECTED NOT DETECTED Final   Staphylococcus species NOT DETECTED NOT DETECTED Final   Staphylococcus aureus (BCID) NOT DETECTED NOT DETECTED Final   Streptococcus species DETECTED (A) NOT DETECTED Final    Comment: Not Enterococcus species, Streptococcus agalactiae, Streptococcus pyogenes, or Streptococcus pneumoniae. CRITICAL RESULT CALLED TO, READ BACK BY AND VERIFIED WITH: PHARMD JEREMY F. [id number] FCP    Streptococcus agalactiae NOT  DETECTED NOT DETECTED Final   Streptococcus pneumoniae NOT DETECTED NOT DETECTED Final   Streptococcus pyogenes NOT DETECTED NOT DETECTED Final   Acinetobacter baumannii NOT DETECTED NOT DETECTED Final   Enterobacteriaceae species NOT DETECTED NOT DETECTED Final   Enterobacter cloacae complex NOT DETECTED NOT DETECTED Final   Escherichia coli NOT DETECTED NOT DETECTED Final   Klebsiella oxytoca NOT DETECTED NOT DETECTED Final   Klebsiella pneumoniae NOT DETECTED NOT DETECTED Final   Proteus species NOT DETECTED NOT DETECTED Final   Serratia marcescens NOT DETECTED NOT DETECTED Final   Haemophilus influenzae NOT DETECTED NOT DETECTED Final   Neisseria meningitidis NOT DETECTED NOT DETECTED Final   Pseudomonas aeruginosa NOT DETECTED NOT DETECTED Final   Candida albicans NOT DETECTED NOT DETECTED Final   Candida glabrata NOT DETECTED NOT DETECTED Final   Candida krusei NOT DETECTED NOT DETECTED Final   Candida parapsilosis NOT DETECTED NOT DETECTED Final   Candida tropicalis NOT DETECTED NOT DETECTED Final    Comment: Performed at [facility], [place] [address], [place], [place] [address]  SARS Coronavirus 2 by RT PCR (hospital order, performed in [facility] hospital lab) Nasopharyngeal Nasopharyngeal Swab     Status: None   Collection Time: 02/08/20  8:48 PM   Specimen: Nasopharyngeal Swab  Result Value Ref Range Status   SARS Coronavirus 2 NEGATIVE NEGATIVE Final    Comment: (NOTE) SARS-CoV-2 target nucleic acids are NOT DETECTED.  The SARS-CoV-2 RNA is generally detectable in upper and lower respiratory specimens during the acute phase of infection. The lowest concentration of SARS-CoV-2 viral copies this assay can detect is 250 copies / mL. A negative result does not preclude SARS-CoV-2 infection and should not be used as the sole basis for treatment or other patient management decisions.  A negative result may occur with improper specimen collection / handling, submission of  specimen other than nasopharyngeal swab, presence of viral mutation(s) within the areas targeted by this assay, and inadequate number of viral copies (<250 copies / mL). A negative result must be combined with clinical observations, patient history, and epidemiological information.  Fact Sheet for Patients:   StrictlyIdeas.no  Fact Sheet for Healthcare Providers: BankingDealers.co.za  This test is not yet approved or  cleared by the [place] FDA and has been authorized for detection and/or diagnosis of SARS-CoV-2 by FDA under an Emergency Use Authorization (EUA).  This EUA will remain in effect (meaning this test can be used) for the duration of the COVID-19 declaration under Section 564(b)(1) of the Act, 21 U.S.C. section 360bbb-3(b)(1), unless the authorization is terminated or revoked sooner.  Performed at [facility], [place] [address] [place], [place] [address]   MRSA PCR Screening     Status: None   Collection Time: 02/09/20 11:32  AM  Result Value Ref Range Status   MRSA by PCR NEGATIVE NEGATIVE Final    Comment:        The GeneXpert MRSA Assay (FDA approved for NASAL specimens only), is one component of a comprehensive MRSA colonization surveillance program. It is not intended to diagnose MRSA infection nor to guide or monitor treatment for MRSA infections. Performed at [facility], [place] [address] [place], [place] [address]   Respiratory Panel by PCR     Status: None   Collection Time: 02/09/20  5:30 PM   Specimen: Nasopharyngeal Swab; Respiratory  Result Value Ref Range Status   Adenovirus NOT DETECTED NOT DETECTED Final   Coronavirus 229E NOT DETECTED NOT DETECTED Final    Comment: (NOTE) The Coronavirus on the Respiratory Panel, DOES NOT test for the novel  Coronavirus (2019 nCoV)    Coronavirus HKU1 NOT DETECTED NOT DETECTED Final   Coronavirus NL63 NOT DETECTED NOT DETECTED Final    Coronavirus OC43 NOT DETECTED NOT DETECTED Final   Metapneumovirus NOT DETECTED NOT DETECTED Final   Rhinovirus / Enterovirus NOT DETECTED NOT DETECTED Final   Influenza A NOT DETECTED NOT DETECTED Final   Influenza B NOT DETECTED NOT DETECTED Final   Parainfluenza Virus 1 NOT DETECTED NOT DETECTED Final   Parainfluenza Virus 2 NOT DETECTED NOT DETECTED Final   Parainfluenza Virus 3 NOT DETECTED NOT DETECTED Final   Parainfluenza Virus 4 NOT DETECTED NOT DETECTED Final   Respiratory Syncytial Virus NOT DETECTED NOT DETECTED Final   Bordetella pertussis NOT DETECTED NOT DETECTED Final   Chlamydophila pneumoniae NOT DETECTED NOT DETECTED Final   Mycoplasma pneumoniae NOT DETECTED NOT DETECTED Final    Comment: Performed at [facility], [place]. [address] [place], [place] [address]  Culture, blood (routine x 2)     Status: None (Preliminary result)   Collection Time: 02/10/20  9:02 AM   Specimen: BLOOD  Result Value Ref Range Status   Specimen Description BLOOD LEFT ANTECUBITAL  Final   Special Requests   Final    BOTTLES DRAWN AEROBIC AND ANAEROBIC Blood Culture adequate volume   Culture   Final    NO GROWTH 4 DAYS Performed at [facility], [place] [address], [place], [place] [address]    Report Status PENDING  Incomplete  Culture, blood (routine x 2)     Status: None (Preliminary result)   Collection Time: 02/10/20  9:05 AM   Specimen: BLOOD LEFT HAND  Result Value Ref Range Status   Specimen Description BLOOD LEFT HAND  Final   Special Requests   Final    BOTTLES DRAWN AEROBIC AND ANAEROBIC Blood Culture adequate volume   Culture   Final    NO GROWTH 4 DAYS Performed at [facility], [place] [address] [place], [place] [address]    Report Status PENDING  Incomplete    FURTHER DISCHARGE INSTRUCTIONS:  Get Medicines reviewed and adjusted: Please take all your medications with you for your next visit with your Primary MD  Laboratory/radiological data: Please  request your Primary MD to go over all hospital tests and procedure/radiological results at the follow up, please ask your Primary MD to get all Hospital records sent to his/her office.  In some cases, they will be blood work, cultures and biopsy results pending at the time of your discharge. Please request that your primary care M.D. goes through all the records of your hospital data and follows up on these results.  Also Note the following: If you experience worsening of your admission symptoms, develop shortness of breath, life threatening emergency, suicidal or homicidal thoughts you must seek medical attention immediately by calling 911 or calling your MD immediately  if symptoms less severe.  You must read complete instructions/literature along with all the possible adverse reactions/side effects for all the Medicines you take and that have been prescribed to you. Take any new Medicines after you have completely understood and accpet all the possible adverse reactions/side effects.   Do not drive when taking Pain medications or sleeping medications (Benzodaizepines)  Do not take more than prescribed Pain, Sleep and Anxiety Medications. It is not advisable to combine anxiety,sleep and pain medications without talking with your primary care practitioner  Special Instructions: If you have smoked or chewed Tobacco  in the last 2 yrs please stop smoking, stop any regular Alcohol  and or any Recreational drug use.  Wear Seat belts while driving.  Please note: You were cared for by a hospitalist during your hospital stay. Once you are discharged, your primary care physician will handle any further medical issues. Please note that NO REFILLS for any discharge medications will be authorized once you are discharged, as it is imperative that you return to your primary care physician (or establish a relationship with a primary care physician if you do not have one) for your post hospital discharge needs  so that they can reassess your need for medications and monitor your lab values.  Total Time spent coordinating discharge including counseling, education and face to face time equals 35 minutes.  SignedOren Binet 02/15/2020 9:58 AM

## 2020-02-15 NOTE — Care Management Important Message (Signed)
Important Message  Patient Details  Name: [name] MRN: [id number] Date of Birth: [birth year]   Medicare Important Message Given:      Patient left prior to IM delivery.  IM mailed to the patient    Orbie Pyo 02/15/2020, 2:48 PM

## 2020-02-15 NOTE — Progress Notes (Signed)
Patient ID: [name], female   DOB: 07-Jun-1976, 44 y.o.   MRN: [id number] [facility] Progress Note   Assessment/ Plan:   1.  Right lower lobe pneumonia with Streptococcus vestibularis bacteremia: On cefazolin based on culture and sensitivities/ID recommendations and without evidence of endocarditis on echocardiogram or focus on Panorex. She remains afebrile and hemodynamically stable and suitable for discharge today with continued IV Ancef for the next 4 treatments (this will complete 2 weeks of antibiotics). 2. ESRD: On a TTS dialysis schedule and will get hemodialysis in the outpatient dialysis unit if discharged today. 3. Anemia: Last hemoglobin was 10.1 yesterday, ESA given.  Intravenous iron on hold with bacteremia. 4. CKD-MBD: Calcium and phosphorus levels currently at goal on calcium acetate for phosphorus binding.  Continue Hectorol for PTH suppression. 5. Nutrition: Continue renal diet with renal multivitamin supplementation. 6. Hypertension: Blood pressures intermittently elevated, continue to monitor with outpatient dialysis/UF  Subjective:   Reports an uneventful night without hypoglycemia.  Denies any chest pain or shortness of breath.   Objective:   BP (!) 156/71   Pulse 71   Temp 98.6 F (37 C) (Oral)   Resp 13   Ht 5\' 3"  (1.6 m)   Wt 55.5 kg   SpO2 93%   BMI 21.67 kg/m   Physical Exam: Gen: Comfortably resting in bed, awaiting breakfast. CVS: Pulse regular rhythm, normal rate, S1 and S2 normal without murmur Resp: Clear to auscultation bilaterally, no rales/rhonchi Abd: Soft, flat, nontender Ext: No lower extremity edema.  Intact dressings right upper arm AV graft.  Labs: BMET Recent Labs  Lab 02/08/20 1715 02/08/20 2355 02/09/20 0405 02/10/20 0907 02/11/20 0326 02/13/20 1355 02/14/20 0116  NA 136  --  136 138 136 137 136  K 3.9  --  3.6 3.4* 3.6 3.7 4.2  CL 96*  --  95* 96* 95* 96* 95*  CO2 27  --  22 29 28   --  27  GLUCOSE  190*  --  179* 65* 171* 99 176*  BUN 16  --  23* 10 19 30* 31*  CREATININE 4.61* 5.08* 5.42* 3.44* 4.59* 6.10* 6.29*  CALCIUM 9.0  --  8.8* 9.3 9.1  --  9.5  PHOS  --   --   --  3.7 3.9  --  3.6   CBC Recent Labs  Lab 02/08/20 1715 02/08/20 2355 02/09/20 0405 02/09/20 0405 02/10/20 0907 02/11/20 0606 02/13/20 1355 02/14/20 0116  WBC 8.6   < > 9.7  --  6.0 5.8  --  6.7  NEUTROABS 7.6  --   --   --  4.4  --   --   --   HGB 10.1*   < > 9.5*   < > 10.8* 10.0* 12.2 10.1*  HCT 34.0*   < > 31.4*   < > 35.5* 33.3* 36.0 32.8*  MCV 84.4   < > 85.1  --  83.3 85.2  --  83.2  PLT 213   < > 249  --  227 196  --  150   < > = values in this interval not displayed.     Medications:    . amLODipine  10 mg Oral QHS  . aspirin EC  81 mg Oral QHS  . calcium acetate  667 mg Oral TID WC   And  . calcium acetate  667 mg Oral With snacks  . Chlorhexidine Gluconate Cloth  6 each Topical Q0600  . darbepoetin (ARANESP)  injection - DIALYSIS  100 mcg Intravenous Q Tue-HD  . doxercalciferol  1 mcg Intravenous Q T,Th,Sa-HD  . heparin  5,000 Units Subcutaneous Q8H  . insulin pump   Subcutaneous TID WC, HS, 0200  . labetalol  300 mg Oral QPM  . losartan  25 mg Oral BID  . multivitamin with minerals  1 tablet Oral q AM  . rosuvastatin  10 mg Oral QHS   Elmarie Shiley, MD 02/15/2020, 7:34 AM

## 2020-02-15 NOTE — Telephone Encounter (Signed)
LMOM for pt to call back to schedule appt

## 2020-02-15 NOTE — Telephone Encounter (Signed)
Scheduled for 02/20/2020

## 2020-02-16 ENCOUNTER — Telehealth: Payer: Self-pay | Admitting: Nephrology

## 2020-02-16 DIAGNOSIS — D689 Coagulation defect, unspecified: Secondary | ICD-10-CM | POA: Diagnosis not present

## 2020-02-16 DIAGNOSIS — E1029 Type 1 diabetes mellitus with other diabetic kidney complication: Secondary | ICD-10-CM | POA: Diagnosis not present

## 2020-02-16 DIAGNOSIS — N186 End stage renal disease: Secondary | ICD-10-CM | POA: Diagnosis not present

## 2020-02-16 DIAGNOSIS — D509 Iron deficiency anemia, unspecified: Secondary | ICD-10-CM | POA: Diagnosis not present

## 2020-02-16 DIAGNOSIS — D631 Anemia in chronic kidney disease: Secondary | ICD-10-CM | POA: Diagnosis not present

## 2020-02-16 DIAGNOSIS — N2581 Secondary hyperparathyroidism of renal origin: Secondary | ICD-10-CM | POA: Diagnosis not present

## 2020-02-16 DIAGNOSIS — R7881 Bacteremia: Secondary | ICD-10-CM | POA: Diagnosis not present

## 2020-02-16 DIAGNOSIS — Z992 Dependence on renal dialysis: Secondary | ICD-10-CM | POA: Diagnosis not present

## 2020-02-16 LAB — CULTURE, BLOOD (ROUTINE X 2)
Culture: NO GROWTH
Culture: NO GROWTH
Special Requests: ADEQUATE
Special Requests: ADEQUATE

## 2020-02-16 NOTE — Telephone Encounter (Signed)
Transition of Care Contact from [facility]  Date of Discharge: 02/15/20 Date of Contact: 02/16/20 Method of contact: phone  Attempted to contact patient to discuss transition of care from inpatient admission.  Patient did not answer the phone.  Message was left on patient's voicemail informing them we would attempt to call them again and if unable to reach will follow up at dialysis.  Jen Mow, PA-C [facility] Pager: [phone]

## 2020-02-17 ENCOUNTER — Telehealth (HOSPITAL_COMMUNITY): Payer: Self-pay | Admitting: Nephrology

## 2020-02-17 NOTE — Telephone Encounter (Signed)
Transition of care contact from inpatient facility  Date of Discharge: 02/15/2020 Date of Contact: 7/30 -- attempted Method of contact: Phone  Attempted to contact patient to discuss transition of care from inpatient admission. Phone goes directly to voicemail. Message was left on the patient's voicemail with call back number [phone].

## 2020-02-18 DIAGNOSIS — D509 Iron deficiency anemia, unspecified: Secondary | ICD-10-CM | POA: Diagnosis not present

## 2020-02-18 DIAGNOSIS — R7881 Bacteremia: Secondary | ICD-10-CM | POA: Diagnosis not present

## 2020-02-18 DIAGNOSIS — N2581 Secondary hyperparathyroidism of renal origin: Secondary | ICD-10-CM | POA: Diagnosis not present

## 2020-02-18 DIAGNOSIS — D631 Anemia in chronic kidney disease: Secondary | ICD-10-CM | POA: Diagnosis not present

## 2020-02-18 DIAGNOSIS — Z992 Dependence on renal dialysis: Secondary | ICD-10-CM | POA: Diagnosis not present

## 2020-02-18 DIAGNOSIS — N186 End stage renal disease: Secondary | ICD-10-CM | POA: Diagnosis not present

## 2020-02-18 DIAGNOSIS — D689 Coagulation defect, unspecified: Secondary | ICD-10-CM | POA: Diagnosis not present

## 2020-02-18 DIAGNOSIS — E1029 Type 1 diabetes mellitus with other diabetic kidney complication: Secondary | ICD-10-CM | POA: Diagnosis not present

## 2020-02-20 ENCOUNTER — Encounter: Payer: Self-pay | Admitting: Cardiology

## 2020-02-20 ENCOUNTER — Other Ambulatory Visit: Payer: Self-pay

## 2020-02-20 ENCOUNTER — Ambulatory Visit: Payer: Medicare HMO | Admitting: Cardiology

## 2020-02-20 ENCOUNTER — Other Ambulatory Visit: Payer: Self-pay | Admitting: Cardiology

## 2020-02-20 VITALS — BP 154/84 | HR 79 | Ht 63.0 in | Wt 127.0 lb

## 2020-02-20 DIAGNOSIS — I10 Essential (primary) hypertension: Secondary | ICD-10-CM

## 2020-02-20 DIAGNOSIS — I2729 Other secondary pulmonary hypertension: Secondary | ICD-10-CM | POA: Diagnosis not present

## 2020-02-20 DIAGNOSIS — Z992 Dependence on renal dialysis: Secondary | ICD-10-CM | POA: Diagnosis not present

## 2020-02-20 DIAGNOSIS — E1022 Type 1 diabetes mellitus with diabetic chronic kidney disease: Secondary | ICD-10-CM

## 2020-02-20 DIAGNOSIS — I129 Hypertensive chronic kidney disease with stage 1 through stage 4 chronic kidney disease, or unspecified chronic kidney disease: Secondary | ICD-10-CM | POA: Diagnosis not present

## 2020-02-20 DIAGNOSIS — N186 End stage renal disease: Secondary | ICD-10-CM

## 2020-02-20 MED ORDER — LABETALOL HCL 300 MG PO TABS: 300.0000 mg | ORAL_TABLET | Freq: Two times a day (BID) | ORAL | 0 refills | Status: DC

## 2020-02-20 NOTE — Progress Notes (Signed)
Primary Physician/Referring:  Benito Mccreedy, MD  Patient ID: [name], female    DOB: [birth year], 44 y.o.   MRN: [id number]  Chief Complaint  Patient presents with  . New Patient (Initial Visit)  . Hypertension   HPI:    [name]  is a 44 y.o. African 44 y.o. African American female patient with type 1 diabetes mellitus, diabetic nephropathy with end-stage renal disease on hemodialysis, referred to me for evaluation of pulmonary hypertension and RV dysfunction. She was admitted to the hospital on 02/08/2020 and discharged 1 week later with altered mental status, hypoglycemia, pneumonia and bacteremia with Streptococcus vestibularis.  She underwent TEE and TTE, now referred to me for evaluation of RV dysfunction noted on the TEE.  Patient denies any dyspnea, no PND or orthopnea, no leg edema.  Her activity level is limited due to her underlying illness.  She has no specific complaints today.  Past Medical History:  Diagnosis Date  . Anemia   . Arthritis   . Diabetes mellitus   . Diabetic retinopathy ([place])    Hx of laser Rx's  . DM type 1 (diabetes mellitus, type 1) ([place]) 09/12/2014   Age of onset for DM type 1 was age 22.     . Enlarged thyroid gland   . ESRD on hemodialysis ([place])    ESRD due to DM type I, age of onset DM 1 was age 60.  Went on dialysis in March 2012.  Gets HD now at [facility] on a TTS schedule.    Marland Kitchen ESRD on hemodialysis ([place]) 09/12/2014   ESRD due to DM type 1.  Started HD in 2012 at [facility].  Gets HD there on a TTS schedule.    Marland Kitchen GERD (gastroesophageal reflux disease)   . History of blood transfusion   . Hyperlipidemia   . Hypertension   . Peripheral vascular disease ([place])   . Pneumonia    Past Surgical History:  Procedure Laterality Date  . ARTERIOVENOUS GRAFT PLACEMENT    . ARTERY REPAIR Left 12/10/2012   Procedure: BRACHIAL ARTERY REPAIR;  Surgeon: Angelia Mould, MD;  Location: [facility] OR;  Service: Vascular;  Laterality: Left;  Exploration  Left Brachial Artery for AVF  . AV FISTULA PLACEMENT Right 01/11/2018   Procedure: INSERTION OF ARTERIOVENOUS (AV) GORE-TEX GRAFT  UPPER ARM;  Surgeon: Angelia Mould, MD;  Location: [place];  Service: Vascular;  Laterality: Right;  . BASCILIC VEIN TRANSPOSITION Right 09/28/2017   Procedure: FIRST STAGE BASILIC VEIN TRANSPOSITION;  Surgeon: Angelia Mould, MD;  Location: [place];  Service: Vascular;  Laterality: Right;  . CESAREAN SECTION    . EYE SURGERY     LAzer  . EYE SURGERY Left   . REVISION OF ARTERIOVENOUS GORETEX GRAFT Left 08/01/2016   Procedure: REVISION OF ARTERIOVENOUS GORETEX GRAFT;  Surgeon: Angelia Mould, MD;  Location: [place];  Service: Vascular;  Laterality: Left;  . REVISION OF ARTERIOVENOUS GORETEX GRAFT Right 10/21/2019   Procedure: REVISION OF ARTERIOVENOUS GORETEX GRAFT ARM;  Surgeon: Rosetta Posner, MD;  Location: [place];  Service: Vascular;  Laterality: Right;  . SHUNTOGRAM Left 11/30/2013   Procedure: SHUNTOGRAM;  Surgeon: Serafina Mitchell, MD;  Location: [facility] CATH LAB;  Service: Cardiovascular;  Laterality: Left;  . TEE WITHOUT CARDIOVERSION N/A 02/13/2020   Procedure: TRANSESOPHAGEAL ECHOCARDIOGRAM (TEE);  Surgeon: Josue Hector, MD;  Location: [facility] ENDOSCOPY;  Service: Cardiovascular;  Laterality: N/A;   Family History  Problem Relation Age of Onset  .  Cancer Mother        breast  . Hypertension Father     Social History   Tobacco Use  . Smoking status: Never Smoker  . Smokeless tobacco: Never Used  Substance Use Topics  . Alcohol use: No    Alcohol/week: 0.0 standard drinks   Marital Status: Single  ROS  Review of Systems  Cardiovascular: Negative for chest pain, dyspnea on exertion and leg swelling.  Gastrointestinal: Negative for melena.   Objective  Blood pressure (!) 154/84, pulse 79, height 5\' 3"  (1.6 m), weight 127 lb (57.6 kg), SpO2 95 %.  Vitals with BMI 02/20/2020 02/14/2020 02/14/2020  Height 5\' 3"  - -  Weight 127 lbs - -  BMI  01.0 - -  Systolic 932 355 732  Diastolic 84 71 74  Pulse 79 71 72     Physical Exam Constitutional:      General: She is not in acute distress.    Appearance: She is ill-appearing.  HENT:     Head: Atraumatic.  Cardiovascular:     Rate and Rhythm: Normal rate and regular rhythm.     Pulses: Intact distal pulses.     Heart sounds: S1 normal and S2 normal. Murmur heard.  Midsystolic murmur is present with a grade of 2/6 at the lower left sternal border and apex.  No gallop.      Comments: No edema. No JVD.  Right arm A-V Fistula noted. Pulmonary:     Effort: Pulmonary effort is normal.     Breath sounds: Normal breath sounds.  Abdominal:     General: Bowel sounds are normal.     Palpations: Abdomen is soft.    Laboratory examination:   Recent Labs    02/10/20 0907 02/10/20 0907 02/11/20 0326 02/13/20 1355 02/14/20 0116  NA 138   < > 136 137 136  K 3.4*   < > 3.6 3.7 4.2  CL 96*   < > 95* 96* 95*  CO2 29  --  28  --  27  GLUCOSE 65*   < > 171* 99 176*  BUN 10   < > 19 30* 31*  CREATININE 3.44*   < > 4.59* 6.10* 6.29*  CALCIUM 9.3  --  9.1  --  9.5  GFRNONAA 16*  --  11*  --  8*  GFRAA 18*  --  13*  --  9*   < > = values in this interval not displayed.   estimated creatinine clearance is 9.7 mL/min (A) (by C-G formula based on SCr of 6.29 mg/dL (H)).  CMP Latest Ref Rng & Units 02/14/2020 02/13/2020 02/11/2020  Glucose 70 - 99 mg/dL 176(H) 99 171(H)  BUN 6 - 20 mg/dL 31(H) 30(H) 19  Creatinine 0.44 - 1.00 mg/dL 6.29(H) 6.10(H) 4.59(H)  Sodium 135 - 145 mmol/L 136 137 136  Potassium 3.5 - 5.1 mmol/L 4.2 3.7 3.6  Chloride 98 - 111 mmol/L 95(L) 96(L) 95(L)  CO2 22 - 32 mmol/L 27 - 28  Calcium 8.9 - 10.3 mg/dL 9.5 - 9.1  Total Protein 6.5 - 8.1 g/dL - - -  Total Bilirubin 0.3 - 1.2 mg/dL - - -  Alkaline Phos 38 - 126 U/L - - -  AST 15 - 41 U/L - - -  ALT 0 - 44 U/L - - -   CBC Latest Ref Rng & Units 02/14/2020 02/13/2020 02/11/2020  WBC 4.0 - 10.5 K/uL 6.7 - 5.8   Hemoglobin 12.0 - 15.0 g/dL  10.1(L) 12.2 10.0(L)  Hematocrit 36 - 46 % 32.8(L) 36.0 33.3(L)  Platelets 150 - 400 K/uL 150 - 196    Lipid Panel No results for input(s): CHOL, TRIG, LDLCALC, VLDL, HDL, CHOLHDL, LDLDIRECT in the last 8760 hours.  HEMOGLOBIN A1C Lab Results  Component Value Date   HGBA1C 6.2 (H) 02/08/2020   MPG 131.24 02/08/2020   TSH Recent Labs    10/21/19 0354  TSH 1.282   Medications and allergies   Outpatient Medications Prior to Visit  Medication Sig Dispense Refill  . amLODipine (NORVASC) 10 MG tablet Take 10 mg by mouth at bedtime.     Marland Kitchen aspirin 81 MG EC tablet Take 81 mg by mouth at bedtime. Swallow whole.     . calcium acetate (PHOSLO) 667 MG capsule Take 667 mg by mouth 3 (three) times daily with meals. May take an additional 667 mg dose with snacks    . ceFAZolin (ANCEF) 2-4 GM/100ML-% IVPB Inject 100 mLs (2 g total) into the vein every Tuesday, Thursday, and Saturday at 6 PM for 8 days. 1 each   . GLUCAGON EMERGENCY 1 MG injection Inject 1 mg into the muscle once as needed (for severe low blood sugar).     . insulin aspart (NOVOLOG) 100 UNIT/ML injection Inject 50 Units into the skin See admin instructions. Use with insulin pump daily    . Insulin Human (INSULIN PUMP) SOLN Inject into the skin continuous. Novolog   Basal rate 0.29 units/ hour    . loratadine (CLARITIN) 10 MG tablet Take 10 mg by mouth daily as needed for allergies.    Marland Kitchen losartan (COZAAR) 25 MG tablet Take 25 mg by mouth in the morning and at bedtime.    . Pediatric Multiple Vit-C-FA (FLINSTONES GUMMIES OMEGA-3 DHA) CHEW Chew 2 tablets by mouth in the morning.     . rosuvastatin (CRESTOR) 10 MG tablet Take 10 mg by mouth at bedtime.     Marland Kitchen labetalol (NORMODYNE) 300 MG tablet Take 300 mg by mouth every evening.      No facility-administered medications prior to visit.   Meds ordered this encounter  Medications  . DISCONTD: labetalol (NORMODYNE) 300 MG tablet    Sig: Take 1 tablet  (300 mg total) by mouth 2 (two) times daily.    Dispense:  60 tablet    Refill:  0    Send refills to Dr. Corliss Parish   Medications Discontinued During This Encounter  Medication Reason  . labetalol (NORMODYNE) 300 MG tablet Reorder     Radiology:   No results found.  Cardiac Studies:   TEE 02/13/2020:  1. No SBE or vegetations seen.  2. Left ventricular ejection fraction, by estimation, is 60 to 65%. The left ventricle has normal function. The left ventricle has no regional  wall motion abnormalities.  3. Right ventricular systolic function is severely reduced. The right ventricular size is severely enlarged.  4. Left atrial size was mildly dilated. No left atrial/left atrial appendage thrombus was detected.  5. Right atrial size was severely dilated.  6. The mitral valve is normal in structure. No evidence of mitral valve regurgitation. No evidence of mitral stenosis.  7. Tricuspid valve regurgitation is severe.  8. The inferior vena cava is normal in size with greater than 50% respiratory variability, suggesting right atrial pressure of 3 mmHg.   Echocardiogram 02/11/2020:  1. Left ventricular ejection fraction, by estimation, is 55 to 60%. The left ventricle has normal function. The left ventricle  has no regional  wall motion abnormalities. There is mild concentric left ventricular hypertrophy. Left ventricular diastolic  parameters are consistent with Grade II diastolic dysfunction (pseudonormalization).  2. Right ventricular systolic function is normal. The right ventricular size is mildly enlarged. There is moderately elevated pulmonary artery  systolic pressure.  3. Left atrial size was moderately dilated.  4. Right atrial size was moderately dilated.  5. The mitral valve is normal in structure. Trivial mitral valve regurgitation.  6. Tricuspid valve regurgitation is severe. Estimated right ventricular systolic pressure is 29.9 mmHg. 7. The inferior  vena cava is normal in size with <50% respiratory variability, suggesting right atrial pressure of 8 mmHg.  EKG  EKG 02/20/2020: Normal sinus rhythm at rate of 79 bpm, left atrial enlargement, normal axis.  LVH with repolarization abnormality, cannot exclude lateral ischemia.  Nonspecific T abnormality. Assessment     ICD-10-CM   1. Other secondary pulmonary hypertension (HCC)  I27.29 EKG 12-Lead  2. Primary hypertension  I10 DISCONTINUED: labetalol (NORMODYNE) 300 MG tablet  3. ESRD on hemodialysis (HCC)  N18.6    Z99.2   4. Type 1 diabetes mellitus with chronic kidney disease on chronic dialysis (HCC)  E10.22    N18.6    Z99.2     Medications Discontinued During This Encounter  Medication Reason  . labetalol (NORMODYNE) 300 MG tablet Reorder    Recommendations:   [name] is a 44 y.o. African American female patient with type 1 diabetes mellitus, diabetic nephropathy with end-stage renal disease on hemodialysis, referred to me for evaluation of pulmonary hypertension and RV dysfunction. She was admitted to the hospital on 02/08/2020 and discharged 1 week later with altered mental status, hypoglycemia, pneumonia and bacteremia with Streptococcus vestibularis.  She underwent TEE and TTE, now referred to me for evaluation of RV dysfunction noted on the TEE.  I have personally reviewed the images of TTE and TEE.  She does have at least a moderate TR by TTE and severe TR by TEE.  The right ventricle does appear to be severely enlarged with depressed systolic function and the TEE but images were limited however on TTE the RV appears mildly dilated with borderline global hypokinesis although read as normal function.  Most importantly the echocardiogram indeed also reveals speckled pattern suggestive of infiltrative cardiomyopathy from longstanding hypertension.  Patient states that her blood pressure has been uncontrolled even after dialysis the blood pressure does not fall.  I suspect the  pulmonary hypertension was related to grade 2 diastolic dysfunction and secondary pulmonary hypertension as a result of uncontrolled hypertension.  I have increased labetalol from 300 mg daily to 300 mg twice daily.  She is being followed by Dr. Judie Grieve with regard to management of hypertension and end-stage renal disease.  I suspect with aggressive control of hypertension, the PA pressure will come down. Also suspect acute illness with sepsis led to abnormality. Fortunately there is no clinical evidence of heart failure, no JVD, no leg edema.  As she remains without any symptoms of dyspnea or chest pain or palpitations, I have not recommended any further cardiac evaluation.  I will see her back on a as needed basis.    Adrian Prows, MD, [facility] 02/23/2020, 1:57 PM Office: [phone]  CC: Drs. Corliss Parish and Iona Beard Osei-Bonsu.

## 2020-02-21 DIAGNOSIS — N2581 Secondary hyperparathyroidism of renal origin: Secondary | ICD-10-CM | POA: Diagnosis not present

## 2020-02-21 DIAGNOSIS — N186 End stage renal disease: Secondary | ICD-10-CM | POA: Diagnosis not present

## 2020-02-21 DIAGNOSIS — E1029 Type 1 diabetes mellitus with other diabetic kidney complication: Secondary | ICD-10-CM | POA: Diagnosis not present

## 2020-02-21 DIAGNOSIS — D689 Coagulation defect, unspecified: Secondary | ICD-10-CM | POA: Diagnosis not present

## 2020-02-21 DIAGNOSIS — D509 Iron deficiency anemia, unspecified: Secondary | ICD-10-CM | POA: Diagnosis not present

## 2020-02-21 DIAGNOSIS — Z992 Dependence on renal dialysis: Secondary | ICD-10-CM | POA: Diagnosis not present

## 2020-02-21 DIAGNOSIS — R7881 Bacteremia: Secondary | ICD-10-CM | POA: Diagnosis not present

## 2020-02-21 DIAGNOSIS — D631 Anemia in chronic kidney disease: Secondary | ICD-10-CM | POA: Diagnosis not present

## 2020-02-23 DIAGNOSIS — N186 End stage renal disease: Secondary | ICD-10-CM | POA: Diagnosis not present

## 2020-02-23 DIAGNOSIS — R7881 Bacteremia: Secondary | ICD-10-CM | POA: Diagnosis not present

## 2020-02-23 DIAGNOSIS — D631 Anemia in chronic kidney disease: Secondary | ICD-10-CM | POA: Diagnosis not present

## 2020-02-23 DIAGNOSIS — Z992 Dependence on renal dialysis: Secondary | ICD-10-CM | POA: Diagnosis not present

## 2020-02-23 DIAGNOSIS — D509 Iron deficiency anemia, unspecified: Secondary | ICD-10-CM | POA: Diagnosis not present

## 2020-02-23 DIAGNOSIS — N2581 Secondary hyperparathyroidism of renal origin: Secondary | ICD-10-CM | POA: Diagnosis not present

## 2020-02-23 DIAGNOSIS — E1029 Type 1 diabetes mellitus with other diabetic kidney complication: Secondary | ICD-10-CM | POA: Diagnosis not present

## 2020-02-23 DIAGNOSIS — D689 Coagulation defect, unspecified: Secondary | ICD-10-CM | POA: Diagnosis not present

## 2020-02-25 DIAGNOSIS — N2581 Secondary hyperparathyroidism of renal origin: Secondary | ICD-10-CM | POA: Diagnosis not present

## 2020-02-25 DIAGNOSIS — D689 Coagulation defect, unspecified: Secondary | ICD-10-CM | POA: Diagnosis not present

## 2020-02-25 DIAGNOSIS — Z992 Dependence on renal dialysis: Secondary | ICD-10-CM | POA: Diagnosis not present

## 2020-02-25 DIAGNOSIS — E1029 Type 1 diabetes mellitus with other diabetic kidney complication: Secondary | ICD-10-CM | POA: Diagnosis not present

## 2020-02-25 DIAGNOSIS — D631 Anemia in chronic kidney disease: Secondary | ICD-10-CM | POA: Diagnosis not present

## 2020-02-25 DIAGNOSIS — N186 End stage renal disease: Secondary | ICD-10-CM | POA: Diagnosis not present

## 2020-02-25 DIAGNOSIS — R7881 Bacteremia: Secondary | ICD-10-CM | POA: Diagnosis not present

## 2020-02-25 DIAGNOSIS — D509 Iron deficiency anemia, unspecified: Secondary | ICD-10-CM | POA: Diagnosis not present

## 2020-02-28 DIAGNOSIS — D689 Coagulation defect, unspecified: Secondary | ICD-10-CM | POA: Diagnosis not present

## 2020-02-28 DIAGNOSIS — E1029 Type 1 diabetes mellitus with other diabetic kidney complication: Secondary | ICD-10-CM | POA: Diagnosis not present

## 2020-02-28 DIAGNOSIS — Z992 Dependence on renal dialysis: Secondary | ICD-10-CM | POA: Diagnosis not present

## 2020-02-28 DIAGNOSIS — N2581 Secondary hyperparathyroidism of renal origin: Secondary | ICD-10-CM | POA: Diagnosis not present

## 2020-02-28 DIAGNOSIS — R7881 Bacteremia: Secondary | ICD-10-CM | POA: Diagnosis not present

## 2020-02-28 DIAGNOSIS — N186 End stage renal disease: Secondary | ICD-10-CM | POA: Diagnosis not present

## 2020-02-28 DIAGNOSIS — D631 Anemia in chronic kidney disease: Secondary | ICD-10-CM | POA: Diagnosis not present

## 2020-02-28 DIAGNOSIS — D509 Iron deficiency anemia, unspecified: Secondary | ICD-10-CM | POA: Diagnosis not present

## 2020-03-01 DIAGNOSIS — E1029 Type 1 diabetes mellitus with other diabetic kidney complication: Secondary | ICD-10-CM | POA: Diagnosis not present

## 2020-03-01 DIAGNOSIS — N186 End stage renal disease: Secondary | ICD-10-CM | POA: Diagnosis not present

## 2020-03-01 DIAGNOSIS — D631 Anemia in chronic kidney disease: Secondary | ICD-10-CM | POA: Diagnosis not present

## 2020-03-01 DIAGNOSIS — D689 Coagulation defect, unspecified: Secondary | ICD-10-CM | POA: Diagnosis not present

## 2020-03-01 DIAGNOSIS — Z992 Dependence on renal dialysis: Secondary | ICD-10-CM | POA: Diagnosis not present

## 2020-03-01 DIAGNOSIS — N2581 Secondary hyperparathyroidism of renal origin: Secondary | ICD-10-CM | POA: Diagnosis not present

## 2020-03-01 DIAGNOSIS — R7881 Bacteremia: Secondary | ICD-10-CM | POA: Diagnosis not present

## 2020-03-01 DIAGNOSIS — D509 Iron deficiency anemia, unspecified: Secondary | ICD-10-CM | POA: Diagnosis not present

## 2020-03-03 DIAGNOSIS — D631 Anemia in chronic kidney disease: Secondary | ICD-10-CM | POA: Diagnosis not present

## 2020-03-03 DIAGNOSIS — N186 End stage renal disease: Secondary | ICD-10-CM | POA: Diagnosis not present

## 2020-03-03 DIAGNOSIS — E1029 Type 1 diabetes mellitus with other diabetic kidney complication: Secondary | ICD-10-CM | POA: Diagnosis not present

## 2020-03-03 DIAGNOSIS — R7881 Bacteremia: Secondary | ICD-10-CM | POA: Diagnosis not present

## 2020-03-03 DIAGNOSIS — Z992 Dependence on renal dialysis: Secondary | ICD-10-CM | POA: Diagnosis not present

## 2020-03-03 DIAGNOSIS — N2581 Secondary hyperparathyroidism of renal origin: Secondary | ICD-10-CM | POA: Diagnosis not present

## 2020-03-03 DIAGNOSIS — D509 Iron deficiency anemia, unspecified: Secondary | ICD-10-CM | POA: Diagnosis not present

## 2020-03-03 DIAGNOSIS — D689 Coagulation defect, unspecified: Secondary | ICD-10-CM | POA: Diagnosis not present

## 2020-03-06 DIAGNOSIS — E1029 Type 1 diabetes mellitus with other diabetic kidney complication: Secondary | ICD-10-CM | POA: Diagnosis not present

## 2020-03-06 DIAGNOSIS — R7881 Bacteremia: Secondary | ICD-10-CM | POA: Diagnosis not present

## 2020-03-06 DIAGNOSIS — D509 Iron deficiency anemia, unspecified: Secondary | ICD-10-CM | POA: Diagnosis not present

## 2020-03-06 DIAGNOSIS — N186 End stage renal disease: Secondary | ICD-10-CM | POA: Diagnosis not present

## 2020-03-06 DIAGNOSIS — Z992 Dependence on renal dialysis: Secondary | ICD-10-CM | POA: Diagnosis not present

## 2020-03-06 DIAGNOSIS — D689 Coagulation defect, unspecified: Secondary | ICD-10-CM | POA: Diagnosis not present

## 2020-03-06 DIAGNOSIS — D631 Anemia in chronic kidney disease: Secondary | ICD-10-CM | POA: Diagnosis not present

## 2020-03-06 DIAGNOSIS — N2581 Secondary hyperparathyroidism of renal origin: Secondary | ICD-10-CM | POA: Diagnosis not present

## 2020-03-08 DIAGNOSIS — D689 Coagulation defect, unspecified: Secondary | ICD-10-CM | POA: Diagnosis not present

## 2020-03-08 DIAGNOSIS — N2581 Secondary hyperparathyroidism of renal origin: Secondary | ICD-10-CM | POA: Diagnosis not present

## 2020-03-08 DIAGNOSIS — E1029 Type 1 diabetes mellitus with other diabetic kidney complication: Secondary | ICD-10-CM | POA: Diagnosis not present

## 2020-03-08 DIAGNOSIS — R7881 Bacteremia: Secondary | ICD-10-CM | POA: Diagnosis not present

## 2020-03-08 DIAGNOSIS — D509 Iron deficiency anemia, unspecified: Secondary | ICD-10-CM | POA: Diagnosis not present

## 2020-03-08 DIAGNOSIS — N186 End stage renal disease: Secondary | ICD-10-CM | POA: Diagnosis not present

## 2020-03-08 DIAGNOSIS — Z992 Dependence on renal dialysis: Secondary | ICD-10-CM | POA: Diagnosis not present

## 2020-03-08 DIAGNOSIS — D631 Anemia in chronic kidney disease: Secondary | ICD-10-CM | POA: Diagnosis not present

## 2020-03-10 DIAGNOSIS — Z992 Dependence on renal dialysis: Secondary | ICD-10-CM | POA: Diagnosis not present

## 2020-03-10 DIAGNOSIS — N186 End stage renal disease: Secondary | ICD-10-CM | POA: Diagnosis not present

## 2020-03-10 DIAGNOSIS — D689 Coagulation defect, unspecified: Secondary | ICD-10-CM | POA: Diagnosis not present

## 2020-03-10 DIAGNOSIS — R7881 Bacteremia: Secondary | ICD-10-CM | POA: Diagnosis not present

## 2020-03-10 DIAGNOSIS — N2581 Secondary hyperparathyroidism of renal origin: Secondary | ICD-10-CM | POA: Diagnosis not present

## 2020-03-10 DIAGNOSIS — D509 Iron deficiency anemia, unspecified: Secondary | ICD-10-CM | POA: Diagnosis not present

## 2020-03-10 DIAGNOSIS — D631 Anemia in chronic kidney disease: Secondary | ICD-10-CM | POA: Diagnosis not present

## 2020-03-10 DIAGNOSIS — E1029 Type 1 diabetes mellitus with other diabetic kidney complication: Secondary | ICD-10-CM | POA: Diagnosis not present

## 2020-03-13 DIAGNOSIS — N2581 Secondary hyperparathyroidism of renal origin: Secondary | ICD-10-CM | POA: Diagnosis not present

## 2020-03-13 DIAGNOSIS — D689 Coagulation defect, unspecified: Secondary | ICD-10-CM | POA: Diagnosis not present

## 2020-03-13 DIAGNOSIS — N186 End stage renal disease: Secondary | ICD-10-CM | POA: Diagnosis not present

## 2020-03-13 DIAGNOSIS — R7881 Bacteremia: Secondary | ICD-10-CM | POA: Diagnosis not present

## 2020-03-13 DIAGNOSIS — Z992 Dependence on renal dialysis: Secondary | ICD-10-CM | POA: Diagnosis not present

## 2020-03-13 DIAGNOSIS — D631 Anemia in chronic kidney disease: Secondary | ICD-10-CM | POA: Diagnosis not present

## 2020-03-13 DIAGNOSIS — E1029 Type 1 diabetes mellitus with other diabetic kidney complication: Secondary | ICD-10-CM | POA: Diagnosis not present

## 2020-03-13 DIAGNOSIS — D509 Iron deficiency anemia, unspecified: Secondary | ICD-10-CM | POA: Diagnosis not present

## 2020-03-15 DIAGNOSIS — N186 End stage renal disease: Secondary | ICD-10-CM | POA: Diagnosis not present

## 2020-03-15 DIAGNOSIS — N2581 Secondary hyperparathyroidism of renal origin: Secondary | ICD-10-CM | POA: Diagnosis not present

## 2020-03-15 DIAGNOSIS — E1029 Type 1 diabetes mellitus with other diabetic kidney complication: Secondary | ICD-10-CM | POA: Diagnosis not present

## 2020-03-15 DIAGNOSIS — R7881 Bacteremia: Secondary | ICD-10-CM | POA: Diagnosis not present

## 2020-03-15 DIAGNOSIS — D631 Anemia in chronic kidney disease: Secondary | ICD-10-CM | POA: Diagnosis not present

## 2020-03-15 DIAGNOSIS — Z992 Dependence on renal dialysis: Secondary | ICD-10-CM | POA: Diagnosis not present

## 2020-03-15 DIAGNOSIS — D509 Iron deficiency anemia, unspecified: Secondary | ICD-10-CM | POA: Diagnosis not present

## 2020-03-15 DIAGNOSIS — D689 Coagulation defect, unspecified: Secondary | ICD-10-CM | POA: Diagnosis not present

## 2020-03-16 DIAGNOSIS — D638 Anemia in other chronic diseases classified elsewhere: Secondary | ICD-10-CM | POA: Diagnosis not present

## 2020-03-16 DIAGNOSIS — E1165 Type 2 diabetes mellitus with hyperglycemia: Secondary | ICD-10-CM | POA: Diagnosis not present

## 2020-03-16 DIAGNOSIS — I10 Essential (primary) hypertension: Secondary | ICD-10-CM | POA: Diagnosis not present

## 2020-03-16 DIAGNOSIS — Z992 Dependence on renal dialysis: Secondary | ICD-10-CM | POA: Diagnosis not present

## 2020-03-16 DIAGNOSIS — Z0001 Encounter for general adult medical examination with abnormal findings: Secondary | ICD-10-CM | POA: Diagnosis not present

## 2020-03-16 DIAGNOSIS — I119 Hypertensive heart disease without heart failure: Secondary | ICD-10-CM | POA: Diagnosis not present

## 2020-03-16 DIAGNOSIS — E782 Mixed hyperlipidemia: Secondary | ICD-10-CM | POA: Diagnosis not present

## 2020-03-16 DIAGNOSIS — E538 Deficiency of other specified B group vitamins: Secondary | ICD-10-CM | POA: Diagnosis not present

## 2020-03-16 DIAGNOSIS — N186 End stage renal disease: Secondary | ICD-10-CM | POA: Diagnosis not present

## 2020-03-16 DIAGNOSIS — E103593 Type 1 diabetes mellitus with proliferative diabetic retinopathy without macular edema, bilateral: Secondary | ICD-10-CM | POA: Diagnosis not present

## 2020-03-16 DIAGNOSIS — E1022 Type 1 diabetes mellitus with diabetic chronic kidney disease: Secondary | ICD-10-CM | POA: Diagnosis not present

## 2020-03-17 DIAGNOSIS — Z992 Dependence on renal dialysis: Secondary | ICD-10-CM | POA: Diagnosis not present

## 2020-03-17 DIAGNOSIS — D689 Coagulation defect, unspecified: Secondary | ICD-10-CM | POA: Diagnosis not present

## 2020-03-17 DIAGNOSIS — R7881 Bacteremia: Secondary | ICD-10-CM | POA: Diagnosis not present

## 2020-03-17 DIAGNOSIS — D631 Anemia in chronic kidney disease: Secondary | ICD-10-CM | POA: Diagnosis not present

## 2020-03-17 DIAGNOSIS — N186 End stage renal disease: Secondary | ICD-10-CM | POA: Diagnosis not present

## 2020-03-17 DIAGNOSIS — E1029 Type 1 diabetes mellitus with other diabetic kidney complication: Secondary | ICD-10-CM | POA: Diagnosis not present

## 2020-03-17 DIAGNOSIS — N2581 Secondary hyperparathyroidism of renal origin: Secondary | ICD-10-CM | POA: Diagnosis not present

## 2020-03-17 DIAGNOSIS — D509 Iron deficiency anemia, unspecified: Secondary | ICD-10-CM | POA: Diagnosis not present

## 2020-03-19 DIAGNOSIS — E1022 Type 1 diabetes mellitus with diabetic chronic kidney disease: Secondary | ICD-10-CM | POA: Diagnosis not present

## 2020-03-20 ENCOUNTER — Ambulatory Visit: Payer: Medicare HMO | Admitting: Infectious Diseases

## 2020-03-20 DIAGNOSIS — E1029 Type 1 diabetes mellitus with other diabetic kidney complication: Secondary | ICD-10-CM | POA: Diagnosis not present

## 2020-03-20 DIAGNOSIS — D509 Iron deficiency anemia, unspecified: Secondary | ICD-10-CM | POA: Diagnosis not present

## 2020-03-20 DIAGNOSIS — D689 Coagulation defect, unspecified: Secondary | ICD-10-CM | POA: Diagnosis not present

## 2020-03-20 DIAGNOSIS — N186 End stage renal disease: Secondary | ICD-10-CM | POA: Diagnosis not present

## 2020-03-20 DIAGNOSIS — Z992 Dependence on renal dialysis: Secondary | ICD-10-CM | POA: Diagnosis not present

## 2020-03-20 DIAGNOSIS — D631 Anemia in chronic kidney disease: Secondary | ICD-10-CM | POA: Diagnosis not present

## 2020-03-20 DIAGNOSIS — R7881 Bacteremia: Secondary | ICD-10-CM | POA: Diagnosis not present

## 2020-03-20 DIAGNOSIS — N2581 Secondary hyperparathyroidism of renal origin: Secondary | ICD-10-CM | POA: Diagnosis not present

## 2020-03-21 ENCOUNTER — Ambulatory Visit: Payer: Medicare HMO | Admitting: Infectious Diseases

## 2020-03-22 DIAGNOSIS — D689 Coagulation defect, unspecified: Secondary | ICD-10-CM | POA: Diagnosis not present

## 2020-03-22 DIAGNOSIS — R188 Other ascites: Secondary | ICD-10-CM | POA: Diagnosis not present

## 2020-03-22 DIAGNOSIS — N2581 Secondary hyperparathyroidism of renal origin: Secondary | ICD-10-CM | POA: Diagnosis not present

## 2020-03-22 DIAGNOSIS — N186 End stage renal disease: Secondary | ICD-10-CM | POA: Diagnosis not present

## 2020-03-22 DIAGNOSIS — E1029 Type 1 diabetes mellitus with other diabetic kidney complication: Secondary | ICD-10-CM | POA: Diagnosis not present

## 2020-03-22 DIAGNOSIS — Z992 Dependence on renal dialysis: Secondary | ICD-10-CM | POA: Diagnosis not present

## 2020-03-24 DIAGNOSIS — R188 Other ascites: Secondary | ICD-10-CM | POA: Diagnosis not present

## 2020-03-24 DIAGNOSIS — E1029 Type 1 diabetes mellitus with other diabetic kidney complication: Secondary | ICD-10-CM | POA: Diagnosis not present

## 2020-03-24 DIAGNOSIS — D689 Coagulation defect, unspecified: Secondary | ICD-10-CM | POA: Diagnosis not present

## 2020-03-24 DIAGNOSIS — N186 End stage renal disease: Secondary | ICD-10-CM | POA: Diagnosis not present

## 2020-03-24 DIAGNOSIS — Z992 Dependence on renal dialysis: Secondary | ICD-10-CM | POA: Diagnosis not present

## 2020-03-24 DIAGNOSIS — N2581 Secondary hyperparathyroidism of renal origin: Secondary | ICD-10-CM | POA: Diagnosis not present

## 2020-03-27 ENCOUNTER — Other Ambulatory Visit (HOSPITAL_COMMUNITY): Payer: Self-pay | Admitting: Nephrology

## 2020-03-27 ENCOUNTER — Other Ambulatory Visit: Payer: Self-pay | Admitting: Nephrology

## 2020-03-27 DIAGNOSIS — D689 Coagulation defect, unspecified: Secondary | ICD-10-CM | POA: Diagnosis not present

## 2020-03-27 DIAGNOSIS — R188 Other ascites: Secondary | ICD-10-CM | POA: Diagnosis not present

## 2020-03-27 DIAGNOSIS — N2581 Secondary hyperparathyroidism of renal origin: Secondary | ICD-10-CM | POA: Diagnosis not present

## 2020-03-27 DIAGNOSIS — Z992 Dependence on renal dialysis: Secondary | ICD-10-CM | POA: Diagnosis not present

## 2020-03-27 DIAGNOSIS — N186 End stage renal disease: Secondary | ICD-10-CM | POA: Diagnosis not present

## 2020-03-27 DIAGNOSIS — E1029 Type 1 diabetes mellitus with other diabetic kidney complication: Secondary | ICD-10-CM | POA: Diagnosis not present

## 2020-03-28 ENCOUNTER — Encounter: Payer: Self-pay | Admitting: Infectious Disease

## 2020-03-28 ENCOUNTER — Ambulatory Visit (INDEPENDENT_AMBULATORY_CARE_PROVIDER_SITE_OTHER): Payer: Medicare HMO | Admitting: Infectious Disease

## 2020-03-28 ENCOUNTER — Other Ambulatory Visit: Payer: Self-pay

## 2020-03-28 VITALS — BP 158/77 | HR 77 | Temp 98.3°F | Wt 120.0 lb

## 2020-03-28 DIAGNOSIS — Z23 Encounter for immunization: Secondary | ICD-10-CM | POA: Diagnosis not present

## 2020-03-28 DIAGNOSIS — Z992 Dependence on renal dialysis: Secondary | ICD-10-CM

## 2020-03-28 DIAGNOSIS — E1022 Type 1 diabetes mellitus with diabetic chronic kidney disease: Secondary | ICD-10-CM | POA: Diagnosis not present

## 2020-03-28 DIAGNOSIS — N186 End stage renal disease: Secondary | ICD-10-CM | POA: Diagnosis not present

## 2020-03-28 DIAGNOSIS — R7881 Bacteremia: Secondary | ICD-10-CM | POA: Diagnosis not present

## 2020-03-28 DIAGNOSIS — R188 Other ascites: Secondary | ICD-10-CM | POA: Diagnosis not present

## 2020-03-28 HISTORY — DX: Other ascites: R18.8

## 2020-03-28 NOTE — Progress Notes (Signed)
Subjective:  Chief complaint: abdominal distention  Patient ID: [name], female    DOB: 1975/07/24, 44 y.o.   MRN: [id number]  HPI  44 y.o. female with end-stage renal disease on hemodialysis admitted with fevers, found to have a right middle lobe pneumonia, then found to have Streptococcus vestibularis bacteremia. TEE no vegetations.  She finished 2 weeks of cefazolin with dialysis.  She has been suffering from noticeable ascites that is going to be worked up as an outpatient.  She has obvious distention on exam today.  She has had 2 doses of COVID-19 through [employer] and we will give her a third today along with a flu shot  Past Medical History:  Diagnosis Date  . Anemia   . Arthritis   . Diabetes mellitus   . Diabetic retinopathy ([place])    Hx of laser Rx's  . DM type 1 (diabetes mellitus, type 1) ([place]) 09/12/2014   Age of onset for DM type 1 was age 56.     . Enlarged thyroid gland   . ESRD on hemodialysis ([place])    ESRD due to DM type I, age of onset DM 1 was age 27.  Went on dialysis in March 2012.  Gets HD now at [facility] on a TTS schedule.    Marland Kitchen ESRD on hemodialysis ([place]) 09/12/2014   ESRD due to DM type 1.  Started HD in 2012 at [facility].  Gets HD there on a TTS schedule.    Marland Kitchen GERD (gastroesophageal reflux disease)   . History of blood transfusion   . Hyperlipidemia   . Hypertension   . Peripheral vascular disease ([place])   . Pneumonia     Past Surgical History:  Procedure Laterality Date  . ARTERIOVENOUS GRAFT PLACEMENT    . ARTERY REPAIR Left 12/10/2012   Procedure: BRACHIAL ARTERY REPAIR;  Surgeon: Angelia Mould, MD;  Location: [facility] OR;  Service: Vascular;  Laterality: Left;  Exploration Left Brachial Artery for AVF  . AV FISTULA PLACEMENT Right 01/11/2018   Procedure: INSERTION OF ARTERIOVENOUS (AV) GORE-TEX GRAFT  UPPER ARM;  Surgeon: Angelia Mould, MD;  Location: [place];  Service: Vascular;  Laterality: Right;  . BASCILIC VEIN TRANSPOSITION  Right 09/28/2017   Procedure: FIRST STAGE BASILIC VEIN TRANSPOSITION;  Surgeon: Angelia Mould, MD;  Location: [place];  Service: Vascular;  Laterality: Right;  . CESAREAN SECTION    . EYE SURGERY     LAzer  . EYE SURGERY Left   . REVISION OF ARTERIOVENOUS GORETEX GRAFT Left 08/01/2016   Procedure: REVISION OF ARTERIOVENOUS GORETEX GRAFT;  Surgeon: Angelia Mould, MD;  Location: [place];  Service: Vascular;  Laterality: Left;  . REVISION OF ARTERIOVENOUS GORETEX GRAFT Right 10/21/2019   Procedure: REVISION OF ARTERIOVENOUS GORETEX GRAFT ARM;  Surgeon: Rosetta Posner, MD;  Location: [place];  Service: Vascular;  Laterality: Right;  . SHUNTOGRAM Left 11/30/2013   Procedure: SHUNTOGRAM;  Surgeon: Serafina Mitchell, MD;  Location: [facility] CATH LAB;  Service: Cardiovascular;  Laterality: Left;  . TEE WITHOUT CARDIOVERSION N/A 02/13/2020   Procedure: TRANSESOPHAGEAL ECHOCARDIOGRAM (TEE);  Surgeon: Josue Hector, MD;  Location[facility] ENDOSCOPY;  Service: Cardiovascular;  Laterality: N/A;    Family History  Problem Relation Age of Onset  . Cancer Mother        breast  . Hypertension Father       Social History   Socioeconomic History  . Marital status: Single    Spouse name:  Not on file  . Number of children: 1  . Years of education: [employer]HEL[employer]'S  . Highest education level: Not on file  Occupational History  . Not on file  Tobacco Use  . Smoking status: Never Smoker  . Smokeless tobacco: Never Used  Vaping Use  . Vaping Use: Never used  Substance and Sexual Activity  . Alcohol use: No    Alcohol/week: 0.0 standard drinks  . Drug use: No  . Sexual activity: Not Currently  Other Topics Concern  . Not on file  Social History Narrative   ** Merged History Encounter **       Patient is single with 1 child. Patient is right handed. Patient has BS degree. Patient drinks 0 caffeine.   Social Determinants of Health   Financial Resource Strain:   . Difficulty of Paying Living Expenses:  Not on file  Food Insecurity:   . Worried About Charity fundraiser in the Last Year: Not on file  . Ran Out of Food in the Last Year: Not on file  Transportation Needs:   . Lack of Transportation (Medical): Not on file  . Lack of Transportation (Non-Medical): Not on file  Physical Activity:   . Days of Exercise per Week: Not on file  . Minutes of Exercise per Session: Not on file  Stress:   . Feeling of Stress : Not on file  Social Connections:   . Frequency of Communication with Friends and Family: Not on file  . Frequency of Social Gatherings with Friends and Family: Not on file  . Attends Religious Services: Not on file  . Active Member of Clubs or Organizations: Not on file  . Attends Archivist Meetings: Not on file  . Marital Status: Not on file    Allergies  Allergen Reactions  . Pollen Extract Other (See Comments)    Watery eyes     Current Outpatient Medications:  .  amLODipine (NORVASC) 10 MG tablet, Take 10 mg by mouth at bedtime. , Disp: , Rfl:  .  aspirin 81 MG EC tablet, Take 81 mg by mouth at bedtime. Swallow whole. , Disp: , Rfl:  .  calcium acetate (PHOSLO) 667 MG capsule, Take 667 mg by mouth 3 (three) times daily with meals. May take an additional 667 mg dose with snacks, Disp: , Rfl:  .  GLUCAGON EMERGENCY 1 MG injection, Inject 1 mg into the muscle once as needed (for severe low blood sugar). , Disp: , Rfl:  .  insulin aspart (NOVOLOG) 100 UNIT/ML injection, Inject 50 Units into the skin See admin instructions. Use with insulin pump daily, Disp: , Rfl:  .  Insulin Human (INSULIN PUMP) SOLN, Inject into the skin continuous. Novolog   Basal rate 0.29 units/ hour, Disp: , Rfl:  .  labetalol (NORMODYNE) 300 MG tablet, TAKE 1 TABLET(300 MG) BY MOUTH TWICE DAILY, Disp: 180 tablet, Rfl: 1 .  loratadine (CLARITIN) 10 MG tablet, Take 10 mg by mouth daily as needed for allergies., Disp: , Rfl:  .  losartan (COZAAR) 25 MG tablet, Take 25 mg by mouth in the  morning and at bedtime., Disp: , Rfl:  .  Pediatric Multiple Vit-C-FA (FLINSTONES GUMMIES OMEGA-3 DHA) CHEW, Chew 2 tablets by mouth in the morning. , Disp: , Rfl:  .  rosuvastatin (CRESTOR) 10 MG tablet, Take 10 mg by mouth at bedtime. , Disp: , Rfl:    Review of Systems  Constitutional: Negative for activity change, appetite change, chills,  diaphoresis, fatigue, fever and unexpected weight change.  HENT: Negative for congestion, rhinorrhea, sinus pressure, sneezing, sore throat and trouble swallowing.   Eyes: Negative for photophobia and visual disturbance.  Respiratory: Negative for cough, chest tightness, shortness of breath, wheezing and stridor.   Cardiovascular: Negative for chest pain, palpitations and leg swelling.  Gastrointestinal: Positive for abdominal distention and abdominal pain. Negative for anal bleeding, blood in stool, constipation, diarrhea, nausea and vomiting.  Genitourinary: Negative for difficulty urinating, dysuria, flank pain and hematuria.  Musculoskeletal: Negative for arthralgias, back pain, gait problem, joint swelling and myalgias.  Skin: Negative for color change, pallor, rash and wound.  Neurological: Negative for dizziness, tremors, weakness and light-headedness.  Hematological: Negative for adenopathy. Does not bruise/bleed easily.  Psychiatric/Behavioral: Negative for agitation, behavioral problems, confusion, decreased concentration, dysphoric mood and sleep disturbance.       Objective:   Physical Exam Constitutional:      General: She is not in acute distress.    Appearance: Normal appearance. She is well-developed. She is not ill-appearing or diaphoretic.  HENT:     Head: Normocephalic and atraumatic.     Right Ear: Hearing and external ear normal.     Left Ear: Hearing and external ear normal.     Nose: No nasal deformity or rhinorrhea.  Eyes:     General: No scleral icterus.    Conjunctiva/sclera: Conjunctivae normal.     Right eye: Right  conjunctiva is not injected.     Left eye: Left conjunctiva is not injected.     Pupils: Pupils are equal, round, and reactive to light.  Neck:     Vascular: No JVD.  Cardiovascular:     Rate and Rhythm: Normal rate and regular rhythm.     Heart sounds: Normal heart sounds, S1 normal and S2 normal. No murmur heard.  No friction rub. No gallop.   Pulmonary:     Effort: Pulmonary effort is normal. No respiratory distress.     Breath sounds: Normal breath sounds. No stridor. No wheezing.  Abdominal:     General: Bowel sounds are normal. There is distension.     Palpations: Abdomen is soft.     Tenderness: There is abdominal tenderness.  Musculoskeletal:        General: Normal range of motion.     Right shoulder: Normal.     Left shoulder: Normal.     Cervical back: Normal range of motion and neck supple.     Right hip: Normal.     Left hip: Normal.     Right knee: Normal.     Left knee: Normal.  Lymphadenopathy:     Head:     Right side of head: No submandibular, preauricular or posterior auricular adenopathy.     Left side of head: No submandibular, preauricular or posterior auricular adenopathy.     Cervical: No cervical adenopathy.     Right cervical: No superficial or deep cervical adenopathy.    Left cervical: No superficial or deep cervical adenopathy.  Skin:    General: Skin is warm and dry.     Coloration: Skin is not pale.     Findings: No abrasion, bruising, ecchymosis, erythema, lesion or rash.     Nails: There is no clubbing.  Neurological:     General: No focal deficit present.     Mental Status: She is alert and oriented to person, place, and time.     Sensory: No sensory deficit.     Coordination: Coordination  normal.     Gait: Gait normal.  Psychiatric:        Attention and Perception: She is attentive.        Mood and Affect: Mood normal.        Speech: Speech normal.        Behavior: Behavior normal. Behavior is cooperative.        Thought Content:  Thought content normal.        Judgment: Judgment normal.           Assessment & Plan:  Streptococcus vestibularis bacteremia: Rec surveillance cultures today.  Ascites: Being worked up as an outpatient.  End-stage renal disease on hemodialysis.  Need for vaccination we will give third Pfizer vaccine today and flu vaccine

## 2020-03-28 NOTE — Progress Notes (Signed)
Note provided documenting flu shot received for dialysis center.  Nayeliz Hipp Lorita Officer, RN

## 2020-03-29 DIAGNOSIS — N186 End stage renal disease: Secondary | ICD-10-CM | POA: Diagnosis not present

## 2020-03-29 DIAGNOSIS — E1029 Type 1 diabetes mellitus with other diabetic kidney complication: Secondary | ICD-10-CM | POA: Diagnosis not present

## 2020-03-29 DIAGNOSIS — Z992 Dependence on renal dialysis: Secondary | ICD-10-CM | POA: Diagnosis not present

## 2020-03-29 DIAGNOSIS — N2581 Secondary hyperparathyroidism of renal origin: Secondary | ICD-10-CM | POA: Diagnosis not present

## 2020-03-29 DIAGNOSIS — D689 Coagulation defect, unspecified: Secondary | ICD-10-CM | POA: Diagnosis not present

## 2020-03-29 DIAGNOSIS — R188 Other ascites: Secondary | ICD-10-CM | POA: Diagnosis not present

## 2020-03-30 ENCOUNTER — Ambulatory Visit (HOSPITAL_COMMUNITY)
Admission: RE | Admit: 2020-03-30 | Discharge: 2020-03-30 | Disposition: A | Discharge status: Home / Self Care | Payer: Medicare HMO | Source: Ambulatory Visit | Attending: Nephrology | Admitting: Nephrology

## 2020-03-30 ENCOUNTER — Other Ambulatory Visit (HOSPITAL_COMMUNITY): Payer: Self-pay | Admitting: Nephrology

## 2020-03-30 DIAGNOSIS — R188 Other ascites: Secondary | ICD-10-CM

## 2020-03-30 MED ORDER — LIDOCAINE HCL 1 % IJ SOLN
INTRAMUSCULAR | Status: AC
  Filled 2020-03-30: qty 20

## 2020-03-31 DIAGNOSIS — Z992 Dependence on renal dialysis: Secondary | ICD-10-CM | POA: Diagnosis not present

## 2020-03-31 DIAGNOSIS — N186 End stage renal disease: Secondary | ICD-10-CM | POA: Diagnosis not present

## 2020-03-31 DIAGNOSIS — R188 Other ascites: Secondary | ICD-10-CM | POA: Diagnosis not present

## 2020-03-31 DIAGNOSIS — N2581 Secondary hyperparathyroidism of renal origin: Secondary | ICD-10-CM | POA: Diagnosis not present

## 2020-03-31 DIAGNOSIS — E1029 Type 1 diabetes mellitus with other diabetic kidney complication: Secondary | ICD-10-CM | POA: Diagnosis not present

## 2020-03-31 DIAGNOSIS — D689 Coagulation defect, unspecified: Secondary | ICD-10-CM | POA: Diagnosis not present

## 2020-04-03 DIAGNOSIS — N2581 Secondary hyperparathyroidism of renal origin: Secondary | ICD-10-CM | POA: Diagnosis not present

## 2020-04-03 DIAGNOSIS — D689 Coagulation defect, unspecified: Secondary | ICD-10-CM | POA: Diagnosis not present

## 2020-04-03 DIAGNOSIS — N186 End stage renal disease: Secondary | ICD-10-CM | POA: Diagnosis not present

## 2020-04-03 DIAGNOSIS — E1029 Type 1 diabetes mellitus with other diabetic kidney complication: Secondary | ICD-10-CM | POA: Diagnosis not present

## 2020-04-03 DIAGNOSIS — Z992 Dependence on renal dialysis: Secondary | ICD-10-CM | POA: Diagnosis not present

## 2020-04-03 DIAGNOSIS — R188 Other ascites: Secondary | ICD-10-CM | POA: Diagnosis not present

## 2020-04-03 LAB — CULTURE, BLOOD (SINGLE)
MICRO NUMBER:: 10925207
MICRO NUMBER:: 10925208
Result:: NO GROWTH
SPECIMEN QUALITY:: ADEQUATE

## 2020-04-05 DIAGNOSIS — Z992 Dependence on renal dialysis: Secondary | ICD-10-CM | POA: Diagnosis not present

## 2020-04-05 DIAGNOSIS — E1029 Type 1 diabetes mellitus with other diabetic kidney complication: Secondary | ICD-10-CM | POA: Diagnosis not present

## 2020-04-05 DIAGNOSIS — N2581 Secondary hyperparathyroidism of renal origin: Secondary | ICD-10-CM | POA: Diagnosis not present

## 2020-04-05 DIAGNOSIS — R188 Other ascites: Secondary | ICD-10-CM | POA: Diagnosis not present

## 2020-04-05 DIAGNOSIS — D689 Coagulation defect, unspecified: Secondary | ICD-10-CM | POA: Diagnosis not present

## 2020-04-05 DIAGNOSIS — N186 End stage renal disease: Secondary | ICD-10-CM | POA: Diagnosis not present

## 2020-04-06 ENCOUNTER — Other Ambulatory Visit: Payer: Self-pay

## 2020-04-06 ENCOUNTER — Ambulatory Visit: Payer: Self-pay

## 2020-04-06 DIAGNOSIS — Z23 Encounter for immunization: Secondary | ICD-10-CM

## 2020-04-06 NOTE — Progress Notes (Signed)
   Covid-19 Vaccination Clinic  Name:  MISTI TOWLE    MRN: [id number] DOB: 02-Aug-1975  04/06/2020  Ms. Unterreiner was observed post Covid-19 immunization for 15 minutes without incident. She was provided with Vaccine Information Sheet and instruction to access the V-Safe system.   Ms. Malmberg was instructed to call 911 with any severe reactions post vaccine: Marland Kitchen Difficulty breathing  . Swelling of face and throat  . A fast heartbeat  . A bad rash all over body  . Dizziness and weakness   Laverle Patter, RN

## 2020-04-07 DIAGNOSIS — E1029 Type 1 diabetes mellitus with other diabetic kidney complication: Secondary | ICD-10-CM | POA: Diagnosis not present

## 2020-04-07 DIAGNOSIS — N2581 Secondary hyperparathyroidism of renal origin: Secondary | ICD-10-CM | POA: Diagnosis not present

## 2020-04-07 DIAGNOSIS — N186 End stage renal disease: Secondary | ICD-10-CM | POA: Diagnosis not present

## 2020-04-07 DIAGNOSIS — D689 Coagulation defect, unspecified: Secondary | ICD-10-CM | POA: Diagnosis not present

## 2020-04-07 DIAGNOSIS — R188 Other ascites: Secondary | ICD-10-CM | POA: Diagnosis not present

## 2020-04-07 DIAGNOSIS — Z992 Dependence on renal dialysis: Secondary | ICD-10-CM | POA: Diagnosis not present

## 2020-04-10 DIAGNOSIS — E1029 Type 1 diabetes mellitus with other diabetic kidney complication: Secondary | ICD-10-CM | POA: Diagnosis not present

## 2020-04-10 DIAGNOSIS — R188 Other ascites: Secondary | ICD-10-CM | POA: Diagnosis not present

## 2020-04-10 DIAGNOSIS — N186 End stage renal disease: Secondary | ICD-10-CM | POA: Diagnosis not present

## 2020-04-10 DIAGNOSIS — N2581 Secondary hyperparathyroidism of renal origin: Secondary | ICD-10-CM | POA: Diagnosis not present

## 2020-04-10 DIAGNOSIS — Z992 Dependence on renal dialysis: Secondary | ICD-10-CM | POA: Diagnosis not present

## 2020-04-10 DIAGNOSIS — D689 Coagulation defect, unspecified: Secondary | ICD-10-CM | POA: Diagnosis not present

## 2020-04-12 DIAGNOSIS — E1029 Type 1 diabetes mellitus with other diabetic kidney complication: Secondary | ICD-10-CM | POA: Diagnosis not present

## 2020-04-12 DIAGNOSIS — D689 Coagulation defect, unspecified: Secondary | ICD-10-CM | POA: Diagnosis not present

## 2020-04-12 DIAGNOSIS — Z992 Dependence on renal dialysis: Secondary | ICD-10-CM | POA: Diagnosis not present

## 2020-04-12 DIAGNOSIS — N186 End stage renal disease: Secondary | ICD-10-CM | POA: Diagnosis not present

## 2020-04-12 DIAGNOSIS — R188 Other ascites: Secondary | ICD-10-CM | POA: Diagnosis not present

## 2020-04-12 DIAGNOSIS — N2581 Secondary hyperparathyroidism of renal origin: Secondary | ICD-10-CM | POA: Diagnosis not present

## 2020-04-14 DIAGNOSIS — Z992 Dependence on renal dialysis: Secondary | ICD-10-CM | POA: Diagnosis not present

## 2020-04-14 DIAGNOSIS — E1029 Type 1 diabetes mellitus with other diabetic kidney complication: Secondary | ICD-10-CM | POA: Diagnosis not present

## 2020-04-14 DIAGNOSIS — D689 Coagulation defect, unspecified: Secondary | ICD-10-CM | POA: Diagnosis not present

## 2020-04-14 DIAGNOSIS — R188 Other ascites: Secondary | ICD-10-CM | POA: Diagnosis not present

## 2020-04-14 DIAGNOSIS — N2581 Secondary hyperparathyroidism of renal origin: Secondary | ICD-10-CM | POA: Diagnosis not present

## 2020-04-14 DIAGNOSIS — N186 End stage renal disease: Secondary | ICD-10-CM | POA: Diagnosis not present

## 2020-04-16 ENCOUNTER — Ambulatory Visit: Payer: Medicare HMO | Admitting: Infectious Diseases

## 2020-04-17 DIAGNOSIS — N2581 Secondary hyperparathyroidism of renal origin: Secondary | ICD-10-CM | POA: Diagnosis not present

## 2020-04-17 DIAGNOSIS — R188 Other ascites: Secondary | ICD-10-CM | POA: Diagnosis not present

## 2020-04-17 DIAGNOSIS — D689 Coagulation defect, unspecified: Secondary | ICD-10-CM | POA: Diagnosis not present

## 2020-04-17 DIAGNOSIS — E1029 Type 1 diabetes mellitus with other diabetic kidney complication: Secondary | ICD-10-CM | POA: Diagnosis not present

## 2020-04-17 DIAGNOSIS — N186 End stage renal disease: Secondary | ICD-10-CM | POA: Diagnosis not present

## 2020-04-17 DIAGNOSIS — Z992 Dependence on renal dialysis: Secondary | ICD-10-CM | POA: Diagnosis not present

## 2020-04-19 DIAGNOSIS — E1029 Type 1 diabetes mellitus with other diabetic kidney complication: Secondary | ICD-10-CM | POA: Diagnosis not present

## 2020-04-19 DIAGNOSIS — Z992 Dependence on renal dialysis: Secondary | ICD-10-CM | POA: Diagnosis not present

## 2020-04-19 DIAGNOSIS — N2581 Secondary hyperparathyroidism of renal origin: Secondary | ICD-10-CM | POA: Diagnosis not present

## 2020-04-19 DIAGNOSIS — N186 End stage renal disease: Secondary | ICD-10-CM | POA: Diagnosis not present

## 2020-04-19 DIAGNOSIS — D689 Coagulation defect, unspecified: Secondary | ICD-10-CM | POA: Diagnosis not present

## 2020-04-19 DIAGNOSIS — R188 Other ascites: Secondary | ICD-10-CM | POA: Diagnosis not present

## 2020-04-19 DIAGNOSIS — E1022 Type 1 diabetes mellitus with diabetic chronic kidney disease: Secondary | ICD-10-CM | POA: Diagnosis not present

## 2020-04-21 DIAGNOSIS — D631 Anemia in chronic kidney disease: Secondary | ICD-10-CM | POA: Diagnosis not present

## 2020-04-21 DIAGNOSIS — Z23 Encounter for immunization: Secondary | ICD-10-CM | POA: Diagnosis not present

## 2020-04-21 DIAGNOSIS — N2581 Secondary hyperparathyroidism of renal origin: Secondary | ICD-10-CM | POA: Diagnosis not present

## 2020-04-21 DIAGNOSIS — N186 End stage renal disease: Secondary | ICD-10-CM | POA: Diagnosis not present

## 2020-04-21 DIAGNOSIS — D509 Iron deficiency anemia, unspecified: Secondary | ICD-10-CM | POA: Diagnosis not present

## 2020-04-21 DIAGNOSIS — E1029 Type 1 diabetes mellitus with other diabetic kidney complication: Secondary | ICD-10-CM | POA: Diagnosis not present

## 2020-04-21 DIAGNOSIS — Z992 Dependence on renal dialysis: Secondary | ICD-10-CM | POA: Diagnosis not present

## 2020-04-21 DIAGNOSIS — D689 Coagulation defect, unspecified: Secondary | ICD-10-CM | POA: Diagnosis not present

## 2020-04-24 DIAGNOSIS — Z23 Encounter for immunization: Secondary | ICD-10-CM | POA: Diagnosis not present

## 2020-04-24 DIAGNOSIS — D631 Anemia in chronic kidney disease: Secondary | ICD-10-CM | POA: Diagnosis not present

## 2020-04-24 DIAGNOSIS — D689 Coagulation defect, unspecified: Secondary | ICD-10-CM | POA: Diagnosis not present

## 2020-04-24 DIAGNOSIS — Z992 Dependence on renal dialysis: Secondary | ICD-10-CM | POA: Diagnosis not present

## 2020-04-24 DIAGNOSIS — N2581 Secondary hyperparathyroidism of renal origin: Secondary | ICD-10-CM | POA: Diagnosis not present

## 2020-04-24 DIAGNOSIS — E1029 Type 1 diabetes mellitus with other diabetic kidney complication: Secondary | ICD-10-CM | POA: Diagnosis not present

## 2020-04-24 DIAGNOSIS — N186 End stage renal disease: Secondary | ICD-10-CM | POA: Diagnosis not present

## 2020-04-24 DIAGNOSIS — D509 Iron deficiency anemia, unspecified: Secondary | ICD-10-CM | POA: Diagnosis not present

## 2020-04-26 DIAGNOSIS — Z23 Encounter for immunization: Secondary | ICD-10-CM | POA: Diagnosis not present

## 2020-04-26 DIAGNOSIS — N186 End stage renal disease: Secondary | ICD-10-CM | POA: Diagnosis not present

## 2020-04-26 DIAGNOSIS — D689 Coagulation defect, unspecified: Secondary | ICD-10-CM | POA: Diagnosis not present

## 2020-04-26 DIAGNOSIS — N2581 Secondary hyperparathyroidism of renal origin: Secondary | ICD-10-CM | POA: Diagnosis not present

## 2020-04-26 DIAGNOSIS — D509 Iron deficiency anemia, unspecified: Secondary | ICD-10-CM | POA: Diagnosis not present

## 2020-04-26 DIAGNOSIS — D631 Anemia in chronic kidney disease: Secondary | ICD-10-CM | POA: Diagnosis not present

## 2020-04-26 DIAGNOSIS — Z992 Dependence on renal dialysis: Secondary | ICD-10-CM | POA: Diagnosis not present

## 2020-04-26 DIAGNOSIS — E1029 Type 1 diabetes mellitus with other diabetic kidney complication: Secondary | ICD-10-CM | POA: Diagnosis not present

## 2020-04-28 DIAGNOSIS — N186 End stage renal disease: Secondary | ICD-10-CM | POA: Diagnosis not present

## 2020-04-28 DIAGNOSIS — E1029 Type 1 diabetes mellitus with other diabetic kidney complication: Secondary | ICD-10-CM | POA: Diagnosis not present

## 2020-04-28 DIAGNOSIS — D509 Iron deficiency anemia, unspecified: Secondary | ICD-10-CM | POA: Diagnosis not present

## 2020-04-28 DIAGNOSIS — D631 Anemia in chronic kidney disease: Secondary | ICD-10-CM | POA: Diagnosis not present

## 2020-04-28 DIAGNOSIS — D689 Coagulation defect, unspecified: Secondary | ICD-10-CM | POA: Diagnosis not present

## 2020-04-28 DIAGNOSIS — Z992 Dependence on renal dialysis: Secondary | ICD-10-CM | POA: Diagnosis not present

## 2020-04-28 DIAGNOSIS — N2581 Secondary hyperparathyroidism of renal origin: Secondary | ICD-10-CM | POA: Diagnosis not present

## 2020-04-28 DIAGNOSIS — Z23 Encounter for immunization: Secondary | ICD-10-CM | POA: Diagnosis not present

## 2020-05-01 DIAGNOSIS — D509 Iron deficiency anemia, unspecified: Secondary | ICD-10-CM | POA: Diagnosis not present

## 2020-05-01 DIAGNOSIS — D689 Coagulation defect, unspecified: Secondary | ICD-10-CM | POA: Diagnosis not present

## 2020-05-01 DIAGNOSIS — N186 End stage renal disease: Secondary | ICD-10-CM | POA: Diagnosis not present

## 2020-05-01 DIAGNOSIS — N2581 Secondary hyperparathyroidism of renal origin: Secondary | ICD-10-CM | POA: Diagnosis not present

## 2020-05-01 DIAGNOSIS — Z23 Encounter for immunization: Secondary | ICD-10-CM | POA: Diagnosis not present

## 2020-05-01 DIAGNOSIS — D631 Anemia in chronic kidney disease: Secondary | ICD-10-CM | POA: Diagnosis not present

## 2020-05-01 DIAGNOSIS — Z992 Dependence on renal dialysis: Secondary | ICD-10-CM | POA: Diagnosis not present

## 2020-05-01 DIAGNOSIS — E1029 Type 1 diabetes mellitus with other diabetic kidney complication: Secondary | ICD-10-CM | POA: Diagnosis not present

## 2020-05-03 DIAGNOSIS — D631 Anemia in chronic kidney disease: Secondary | ICD-10-CM | POA: Diagnosis not present

## 2020-05-03 DIAGNOSIS — N2581 Secondary hyperparathyroidism of renal origin: Secondary | ICD-10-CM | POA: Diagnosis not present

## 2020-05-03 DIAGNOSIS — E1029 Type 1 diabetes mellitus with other diabetic kidney complication: Secondary | ICD-10-CM | POA: Diagnosis not present

## 2020-05-03 DIAGNOSIS — Z23 Encounter for immunization: Secondary | ICD-10-CM | POA: Diagnosis not present

## 2020-05-03 DIAGNOSIS — N186 End stage renal disease: Secondary | ICD-10-CM | POA: Diagnosis not present

## 2020-05-03 DIAGNOSIS — Z992 Dependence on renal dialysis: Secondary | ICD-10-CM | POA: Diagnosis not present

## 2020-05-03 DIAGNOSIS — D689 Coagulation defect, unspecified: Secondary | ICD-10-CM | POA: Diagnosis not present

## 2020-05-03 DIAGNOSIS — D509 Iron deficiency anemia, unspecified: Secondary | ICD-10-CM | POA: Diagnosis not present

## 2020-05-04 ENCOUNTER — Observation Stay (HOSPITAL_COMMUNITY): Admission: AD | Admit: 2020-05-04 | Payer: Medicare HMO | Source: Ambulatory Visit | Admitting: Internal Medicine

## 2020-05-04 DIAGNOSIS — E538 Deficiency of other specified B group vitamins: Secondary | ICD-10-CM | POA: Diagnosis not present

## 2020-05-04 DIAGNOSIS — I272 Pulmonary hypertension, unspecified: Secondary | ICD-10-CM | POA: Diagnosis not present

## 2020-05-04 DIAGNOSIS — I10 Essential (primary) hypertension: Secondary | ICD-10-CM | POA: Diagnosis not present

## 2020-05-04 DIAGNOSIS — E782 Mixed hyperlipidemia: Secondary | ICD-10-CM | POA: Diagnosis not present

## 2020-05-04 DIAGNOSIS — R188 Other ascites: Secondary | ICD-10-CM | POA: Diagnosis not present

## 2020-05-04 DIAGNOSIS — E1165 Type 2 diabetes mellitus with hyperglycemia: Secondary | ICD-10-CM | POA: Diagnosis not present

## 2020-05-04 DIAGNOSIS — R0602 Shortness of breath: Secondary | ICD-10-CM | POA: Diagnosis not present

## 2020-05-04 DIAGNOSIS — R160 Hepatomegaly, not elsewhere classified: Secondary | ICD-10-CM | POA: Diagnosis not present

## 2020-05-04 DIAGNOSIS — I119 Hypertensive heart disease without heart failure: Secondary | ICD-10-CM | POA: Diagnosis not present

## 2020-05-04 DIAGNOSIS — D638 Anemia in other chronic diseases classified elsewhere: Secondary | ICD-10-CM | POA: Diagnosis not present

## 2020-05-05 DIAGNOSIS — N186 End stage renal disease: Secondary | ICD-10-CM | POA: Diagnosis not present

## 2020-05-05 DIAGNOSIS — D631 Anemia in chronic kidney disease: Secondary | ICD-10-CM | POA: Diagnosis not present

## 2020-05-05 DIAGNOSIS — Z23 Encounter for immunization: Secondary | ICD-10-CM | POA: Diagnosis not present

## 2020-05-05 DIAGNOSIS — D509 Iron deficiency anemia, unspecified: Secondary | ICD-10-CM | POA: Diagnosis not present

## 2020-05-05 DIAGNOSIS — N2581 Secondary hyperparathyroidism of renal origin: Secondary | ICD-10-CM | POA: Diagnosis not present

## 2020-05-05 DIAGNOSIS — Z992 Dependence on renal dialysis: Secondary | ICD-10-CM | POA: Diagnosis not present

## 2020-05-05 DIAGNOSIS — D689 Coagulation defect, unspecified: Secondary | ICD-10-CM | POA: Diagnosis not present

## 2020-05-05 DIAGNOSIS — E1029 Type 1 diabetes mellitus with other diabetic kidney complication: Secondary | ICD-10-CM | POA: Diagnosis not present

## 2020-05-08 DIAGNOSIS — Z23 Encounter for immunization: Secondary | ICD-10-CM | POA: Diagnosis not present

## 2020-05-08 DIAGNOSIS — D689 Coagulation defect, unspecified: Secondary | ICD-10-CM | POA: Diagnosis not present

## 2020-05-08 DIAGNOSIS — Z992 Dependence on renal dialysis: Secondary | ICD-10-CM | POA: Diagnosis not present

## 2020-05-08 DIAGNOSIS — N186 End stage renal disease: Secondary | ICD-10-CM | POA: Diagnosis not present

## 2020-05-08 DIAGNOSIS — N2581 Secondary hyperparathyroidism of renal origin: Secondary | ICD-10-CM | POA: Diagnosis not present

## 2020-05-08 DIAGNOSIS — D631 Anemia in chronic kidney disease: Secondary | ICD-10-CM | POA: Diagnosis not present

## 2020-05-08 DIAGNOSIS — D509 Iron deficiency anemia, unspecified: Secondary | ICD-10-CM | POA: Diagnosis not present

## 2020-05-08 DIAGNOSIS — E1029 Type 1 diabetes mellitus with other diabetic kidney complication: Secondary | ICD-10-CM | POA: Diagnosis not present

## 2020-05-10 DIAGNOSIS — N186 End stage renal disease: Secondary | ICD-10-CM | POA: Diagnosis not present

## 2020-05-10 DIAGNOSIS — Z992 Dependence on renal dialysis: Secondary | ICD-10-CM | POA: Diagnosis not present

## 2020-05-10 DIAGNOSIS — Z23 Encounter for immunization: Secondary | ICD-10-CM | POA: Diagnosis not present

## 2020-05-10 DIAGNOSIS — D509 Iron deficiency anemia, unspecified: Secondary | ICD-10-CM | POA: Diagnosis not present

## 2020-05-10 DIAGNOSIS — N2581 Secondary hyperparathyroidism of renal origin: Secondary | ICD-10-CM | POA: Diagnosis not present

## 2020-05-10 DIAGNOSIS — D631 Anemia in chronic kidney disease: Secondary | ICD-10-CM | POA: Diagnosis not present

## 2020-05-10 DIAGNOSIS — E1029 Type 1 diabetes mellitus with other diabetic kidney complication: Secondary | ICD-10-CM | POA: Diagnosis not present

## 2020-05-10 DIAGNOSIS — D689 Coagulation defect, unspecified: Secondary | ICD-10-CM | POA: Diagnosis not present

## 2020-05-12 DIAGNOSIS — D689 Coagulation defect, unspecified: Secondary | ICD-10-CM | POA: Diagnosis not present

## 2020-05-12 DIAGNOSIS — N2581 Secondary hyperparathyroidism of renal origin: Secondary | ICD-10-CM | POA: Diagnosis not present

## 2020-05-12 DIAGNOSIS — Z23 Encounter for immunization: Secondary | ICD-10-CM | POA: Diagnosis not present

## 2020-05-12 DIAGNOSIS — D631 Anemia in chronic kidney disease: Secondary | ICD-10-CM | POA: Diagnosis not present

## 2020-05-12 DIAGNOSIS — Z992 Dependence on renal dialysis: Secondary | ICD-10-CM | POA: Diagnosis not present

## 2020-05-12 DIAGNOSIS — E1029 Type 1 diabetes mellitus with other diabetic kidney complication: Secondary | ICD-10-CM | POA: Diagnosis not present

## 2020-05-12 DIAGNOSIS — N186 End stage renal disease: Secondary | ICD-10-CM | POA: Diagnosis not present

## 2020-05-12 DIAGNOSIS — D509 Iron deficiency anemia, unspecified: Secondary | ICD-10-CM | POA: Diagnosis not present

## 2020-05-14 ENCOUNTER — Telehealth (HOSPITAL_COMMUNITY): Payer: Self-pay | Admitting: Vascular Surgery

## 2020-05-14 NOTE — Telephone Encounter (Signed)
Left pt message to make new chf appt w/ db 11/1 @ 10:30 OR 11/8 10:30 or w/ Mclean 11/17 @ 10;30

## 2020-05-15 DIAGNOSIS — N186 End stage renal disease: Secondary | ICD-10-CM | POA: Diagnosis not present

## 2020-05-15 DIAGNOSIS — D631 Anemia in chronic kidney disease: Secondary | ICD-10-CM | POA: Diagnosis not present

## 2020-05-15 DIAGNOSIS — D509 Iron deficiency anemia, unspecified: Secondary | ICD-10-CM | POA: Diagnosis not present

## 2020-05-15 DIAGNOSIS — Z992 Dependence on renal dialysis: Secondary | ICD-10-CM | POA: Diagnosis not present

## 2020-05-15 DIAGNOSIS — E1029 Type 1 diabetes mellitus with other diabetic kidney complication: Secondary | ICD-10-CM | POA: Diagnosis not present

## 2020-05-15 DIAGNOSIS — D689 Coagulation defect, unspecified: Secondary | ICD-10-CM | POA: Diagnosis not present

## 2020-05-15 DIAGNOSIS — N2581 Secondary hyperparathyroidism of renal origin: Secondary | ICD-10-CM | POA: Diagnosis not present

## 2020-05-15 DIAGNOSIS — Z23 Encounter for immunization: Secondary | ICD-10-CM | POA: Diagnosis not present

## 2020-05-17 DIAGNOSIS — D631 Anemia in chronic kidney disease: Secondary | ICD-10-CM | POA: Diagnosis not present

## 2020-05-17 DIAGNOSIS — Z992 Dependence on renal dialysis: Secondary | ICD-10-CM | POA: Diagnosis not present

## 2020-05-17 DIAGNOSIS — D689 Coagulation defect, unspecified: Secondary | ICD-10-CM | POA: Diagnosis not present

## 2020-05-17 DIAGNOSIS — E1029 Type 1 diabetes mellitus with other diabetic kidney complication: Secondary | ICD-10-CM | POA: Diagnosis not present

## 2020-05-17 DIAGNOSIS — D509 Iron deficiency anemia, unspecified: Secondary | ICD-10-CM | POA: Diagnosis not present

## 2020-05-17 DIAGNOSIS — N186 End stage renal disease: Secondary | ICD-10-CM | POA: Diagnosis not present

## 2020-05-17 DIAGNOSIS — Z23 Encounter for immunization: Secondary | ICD-10-CM | POA: Diagnosis not present

## 2020-05-17 DIAGNOSIS — N2581 Secondary hyperparathyroidism of renal origin: Secondary | ICD-10-CM | POA: Diagnosis not present

## 2020-05-20 DIAGNOSIS — Z992 Dependence on renal dialysis: Secondary | ICD-10-CM | POA: Diagnosis not present

## 2020-05-20 DIAGNOSIS — N186 End stage renal disease: Secondary | ICD-10-CM | POA: Diagnosis not present

## 2020-05-20 DIAGNOSIS — E1029 Type 1 diabetes mellitus with other diabetic kidney complication: Secondary | ICD-10-CM | POA: Diagnosis not present

## 2020-05-21 ENCOUNTER — Inpatient Hospital Stay (HOSPITAL_COMMUNITY): Admission: RE | Admit: 2020-05-21 | Payer: Medicare HMO | Source: Ambulatory Visit | Admitting: Internal Medicine

## 2020-05-22 DIAGNOSIS — Z992 Dependence on renal dialysis: Secondary | ICD-10-CM | POA: Diagnosis not present

## 2020-05-22 DIAGNOSIS — D689 Coagulation defect, unspecified: Secondary | ICD-10-CM | POA: Diagnosis not present

## 2020-05-22 DIAGNOSIS — D631 Anemia in chronic kidney disease: Secondary | ICD-10-CM | POA: Diagnosis not present

## 2020-05-22 DIAGNOSIS — D509 Iron deficiency anemia, unspecified: Secondary | ICD-10-CM | POA: Diagnosis not present

## 2020-05-22 DIAGNOSIS — E1029 Type 1 diabetes mellitus with other diabetic kidney complication: Secondary | ICD-10-CM | POA: Diagnosis not present

## 2020-05-22 DIAGNOSIS — N186 End stage renal disease: Secondary | ICD-10-CM | POA: Diagnosis not present

## 2020-05-22 DIAGNOSIS — N2581 Secondary hyperparathyroidism of renal origin: Secondary | ICD-10-CM | POA: Diagnosis not present

## 2020-05-24 DIAGNOSIS — E1029 Type 1 diabetes mellitus with other diabetic kidney complication: Secondary | ICD-10-CM | POA: Diagnosis not present

## 2020-05-24 DIAGNOSIS — D631 Anemia in chronic kidney disease: Secondary | ICD-10-CM | POA: Diagnosis not present

## 2020-05-24 DIAGNOSIS — N186 End stage renal disease: Secondary | ICD-10-CM | POA: Diagnosis not present

## 2020-05-24 DIAGNOSIS — D689 Coagulation defect, unspecified: Secondary | ICD-10-CM | POA: Diagnosis not present

## 2020-05-24 DIAGNOSIS — D509 Iron deficiency anemia, unspecified: Secondary | ICD-10-CM | POA: Diagnosis not present

## 2020-05-24 DIAGNOSIS — N2581 Secondary hyperparathyroidism of renal origin: Secondary | ICD-10-CM | POA: Diagnosis not present

## 2020-05-24 DIAGNOSIS — Z992 Dependence on renal dialysis: Secondary | ICD-10-CM | POA: Diagnosis not present

## 2020-05-26 DIAGNOSIS — D509 Iron deficiency anemia, unspecified: Secondary | ICD-10-CM | POA: Diagnosis not present

## 2020-05-26 DIAGNOSIS — Z992 Dependence on renal dialysis: Secondary | ICD-10-CM | POA: Diagnosis not present

## 2020-05-26 DIAGNOSIS — N2581 Secondary hyperparathyroidism of renal origin: Secondary | ICD-10-CM | POA: Diagnosis not present

## 2020-05-26 DIAGNOSIS — N186 End stage renal disease: Secondary | ICD-10-CM | POA: Diagnosis not present

## 2020-05-26 DIAGNOSIS — D689 Coagulation defect, unspecified: Secondary | ICD-10-CM | POA: Diagnosis not present

## 2020-05-26 DIAGNOSIS — D631 Anemia in chronic kidney disease: Secondary | ICD-10-CM | POA: Diagnosis not present

## 2020-05-26 DIAGNOSIS — E1029 Type 1 diabetes mellitus with other diabetic kidney complication: Secondary | ICD-10-CM | POA: Diagnosis not present

## 2020-05-29 DIAGNOSIS — D631 Anemia in chronic kidney disease: Secondary | ICD-10-CM | POA: Diagnosis not present

## 2020-05-29 DIAGNOSIS — D689 Coagulation defect, unspecified: Secondary | ICD-10-CM | POA: Diagnosis not present

## 2020-05-29 DIAGNOSIS — N2581 Secondary hyperparathyroidism of renal origin: Secondary | ICD-10-CM | POA: Diagnosis not present

## 2020-05-29 DIAGNOSIS — Z992 Dependence on renal dialysis: Secondary | ICD-10-CM | POA: Diagnosis not present

## 2020-05-29 DIAGNOSIS — N186 End stage renal disease: Secondary | ICD-10-CM | POA: Diagnosis not present

## 2020-05-29 DIAGNOSIS — E1029 Type 1 diabetes mellitus with other diabetic kidney complication: Secondary | ICD-10-CM | POA: Diagnosis not present

## 2020-05-29 DIAGNOSIS — D509 Iron deficiency anemia, unspecified: Secondary | ICD-10-CM | POA: Diagnosis not present

## 2020-05-30 ENCOUNTER — Inpatient Hospital Stay (HOSPITAL_COMMUNITY)
Admission: EM | Admit: 2020-05-30 | Discharge: 2020-06-03 | DRG: 186 | Disposition: A | Discharge status: Home / Self Care | Payer: Medicare HMO | Attending: Family Medicine | Admitting: Family Medicine

## 2020-05-30 ENCOUNTER — Inpatient Hospital Stay (HOSPITAL_COMMUNITY): Payer: Medicare HMO

## 2020-05-30 ENCOUNTER — Emergency Department (HOSPITAL_COMMUNITY): Payer: Medicare HMO

## 2020-05-30 ENCOUNTER — Other Ambulatory Visit: Payer: Self-pay

## 2020-05-30 DIAGNOSIS — Z20822 Contact with and (suspected) exposure to covid-19: Secondary | ICD-10-CM | POA: Diagnosis not present

## 2020-05-30 DIAGNOSIS — Z79899 Other long term (current) drug therapy: Secondary | ICD-10-CM

## 2020-05-30 DIAGNOSIS — R160 Hepatomegaly, not elsewhere classified: Secondary | ICD-10-CM | POA: Diagnosis not present

## 2020-05-30 DIAGNOSIS — J90 Pleural effusion, not elsewhere classified: Secondary | ICD-10-CM | POA: Diagnosis not present

## 2020-05-30 DIAGNOSIS — I69392 Facial weakness following cerebral infarction: Secondary | ICD-10-CM | POA: Diagnosis not present

## 2020-05-30 DIAGNOSIS — R0600 Dyspnea, unspecified: Secondary | ICD-10-CM | POA: Diagnosis not present

## 2020-05-30 DIAGNOSIS — E1165 Type 2 diabetes mellitus with hyperglycemia: Secondary | ICD-10-CM | POA: Diagnosis not present

## 2020-05-30 DIAGNOSIS — Z4682 Encounter for fitting and adjustment of non-vascular catheter: Secondary | ICD-10-CM | POA: Diagnosis not present

## 2020-05-30 DIAGNOSIS — Z992 Dependence on renal dialysis: Secondary | ICD-10-CM | POA: Diagnosis not present

## 2020-05-30 DIAGNOSIS — E10319 Type 1 diabetes mellitus with unspecified diabetic retinopathy without macular edema: Secondary | ICD-10-CM | POA: Diagnosis present

## 2020-05-30 DIAGNOSIS — I120 Hypertensive chronic kidney disease with stage 5 chronic kidney disease or end stage renal disease: Secondary | ICD-10-CM | POA: Diagnosis not present

## 2020-05-30 DIAGNOSIS — R0902 Hypoxemia: Secondary | ICD-10-CM

## 2020-05-30 DIAGNOSIS — I071 Rheumatic tricuspid insufficiency: Secondary | ICD-10-CM | POA: Diagnosis present

## 2020-05-30 DIAGNOSIS — K5900 Constipation, unspecified: Secondary | ICD-10-CM | POA: Diagnosis not present

## 2020-05-30 DIAGNOSIS — D638 Anemia in other chronic diseases classified elsewhere: Secondary | ICD-10-CM | POA: Diagnosis not present

## 2020-05-30 DIAGNOSIS — N250 Renal osteodystrophy: Secondary | ICD-10-CM | POA: Diagnosis present

## 2020-05-30 DIAGNOSIS — K8010 Calculus of gallbladder with chronic cholecystitis without obstruction: Secondary | ICD-10-CM | POA: Diagnosis not present

## 2020-05-30 DIAGNOSIS — I313 Pericardial effusion (noninflammatory): Secondary | ICD-10-CM | POA: Diagnosis not present

## 2020-05-30 DIAGNOSIS — I132 Hypertensive heart and chronic kidney disease with heart failure and with stage 5 chronic kidney disease, or end stage renal disease: Secondary | ICD-10-CM | POA: Diagnosis present

## 2020-05-30 DIAGNOSIS — I119 Hypertensive heart disease without heart failure: Secondary | ICD-10-CM | POA: Diagnosis not present

## 2020-05-30 DIAGNOSIS — D631 Anemia in chronic kidney disease: Secondary | ICD-10-CM | POA: Diagnosis present

## 2020-05-30 DIAGNOSIS — E1022 Type 1 diabetes mellitus with diabetic chronic kidney disease: Secondary | ICD-10-CM | POA: Diagnosis present

## 2020-05-30 DIAGNOSIS — I517 Cardiomegaly: Secondary | ICD-10-CM | POA: Diagnosis not present

## 2020-05-30 DIAGNOSIS — R0602 Shortness of breath: Secondary | ICD-10-CM | POA: Diagnosis not present

## 2020-05-30 DIAGNOSIS — I5032 Chronic diastolic (congestive) heart failure: Secondary | ICD-10-CM | POA: Diagnosis not present

## 2020-05-30 DIAGNOSIS — M1990 Unspecified osteoarthritis, unspecified site: Secondary | ICD-10-CM | POA: Diagnosis present

## 2020-05-30 DIAGNOSIS — Z9689 Presence of other specified functional implants: Secondary | ICD-10-CM

## 2020-05-30 DIAGNOSIS — I509 Heart failure, unspecified: Secondary | ICD-10-CM | POA: Diagnosis not present

## 2020-05-30 DIAGNOSIS — J939 Pneumothorax, unspecified: Secondary | ICD-10-CM | POA: Diagnosis not present

## 2020-05-30 DIAGNOSIS — E1051 Type 1 diabetes mellitus with diabetic peripheral angiopathy without gangrene: Secondary | ICD-10-CM | POA: Diagnosis present

## 2020-05-30 DIAGNOSIS — E109 Type 1 diabetes mellitus without complications: Secondary | ICD-10-CM | POA: Diagnosis not present

## 2020-05-30 DIAGNOSIS — J9601 Acute respiratory failure with hypoxia: Secondary | ICD-10-CM | POA: Diagnosis present

## 2020-05-30 DIAGNOSIS — E782 Mixed hyperlipidemia: Secondary | ICD-10-CM | POA: Diagnosis not present

## 2020-05-30 DIAGNOSIS — Z8249 Family history of ischemic heart disease and other diseases of the circulatory system: Secondary | ICD-10-CM | POA: Diagnosis not present

## 2020-05-30 DIAGNOSIS — Z9641 Presence of insulin pump (external) (internal): Secondary | ICD-10-CM | POA: Diagnosis present

## 2020-05-30 DIAGNOSIS — N186 End stage renal disease: Secondary | ICD-10-CM | POA: Diagnosis present

## 2020-05-30 DIAGNOSIS — K8020 Calculus of gallbladder without cholecystitis without obstruction: Secondary | ICD-10-CM | POA: Diagnosis not present

## 2020-05-30 DIAGNOSIS — N2581 Secondary hyperparathyroidism of renal origin: Secondary | ICD-10-CM | POA: Diagnosis not present

## 2020-05-30 DIAGNOSIS — I10 Essential (primary) hypertension: Secondary | ICD-10-CM | POA: Diagnosis not present

## 2020-05-30 DIAGNOSIS — J9811 Atelectasis: Secondary | ICD-10-CM | POA: Diagnosis not present

## 2020-05-30 DIAGNOSIS — J948 Other specified pleural conditions: Secondary | ICD-10-CM | POA: Diagnosis not present

## 2020-05-30 DIAGNOSIS — R188 Other ascites: Secondary | ICD-10-CM | POA: Diagnosis not present

## 2020-05-30 DIAGNOSIS — Z794 Long term (current) use of insulin: Secondary | ICD-10-CM | POA: Diagnosis not present

## 2020-05-30 DIAGNOSIS — K828 Other specified diseases of gallbladder: Secondary | ICD-10-CM | POA: Diagnosis not present

## 2020-05-30 DIAGNOSIS — E538 Deficiency of other specified B group vitamins: Secondary | ICD-10-CM | POA: Diagnosis not present

## 2020-05-30 DIAGNOSIS — Z9889 Other specified postprocedural states: Secondary | ICD-10-CM

## 2020-05-30 DIAGNOSIS — E785 Hyperlipidemia, unspecified: Secondary | ICD-10-CM | POA: Diagnosis present

## 2020-05-30 DIAGNOSIS — J9819 Other pulmonary collapse: Secondary | ICD-10-CM | POA: Diagnosis not present

## 2020-05-30 DIAGNOSIS — D649 Anemia, unspecified: Secondary | ICD-10-CM | POA: Diagnosis not present

## 2020-05-30 DIAGNOSIS — K219 Gastro-esophageal reflux disease without esophagitis: Secondary | ICD-10-CM | POA: Diagnosis present

## 2020-05-30 DIAGNOSIS — J189 Pneumonia, unspecified organism: Secondary | ICD-10-CM | POA: Diagnosis not present

## 2020-05-30 DIAGNOSIS — R0689 Other abnormalities of breathing: Secondary | ICD-10-CM | POA: Diagnosis present

## 2020-05-30 DIAGNOSIS — Z803 Family history of malignant neoplasm of breast: Secondary | ICD-10-CM

## 2020-05-30 DIAGNOSIS — I272 Pulmonary hypertension, unspecified: Secondary | ICD-10-CM | POA: Diagnosis not present

## 2020-05-30 LAB — CBC WITH DIFFERENTIAL/PLATELET
Abs Immature Granulocytes: 0.02 10*3/uL (ref 0.00–0.07)
Basophils Absolute: 0.1 10*3/uL (ref 0.0–0.1)
Basophils Relative: 1 %
Eosinophils Absolute: 0.4 10*3/uL (ref 0.0–0.5)
Eosinophils Relative: 6 %
HCT: 34.6 % — ABNORMAL LOW (ref 36.0–46.0)
Hemoglobin: 10.7 g/dL — ABNORMAL LOW (ref 12.0–15.0)
Immature Granulocytes: 0 %
Lymphocytes Relative: 19 %
Lymphs Abs: 1.1 10*3/uL (ref 0.7–4.0)
MCH: 28.2 pg (ref 26.0–34.0)
MCHC: 30.9 g/dL (ref 30.0–36.0)
MCV: 91.3 fL (ref 80.0–100.0)
Monocytes Absolute: 0.6 10*3/uL (ref 0.1–1.0)
Monocytes Relative: 11 %
Neutro Abs: 3.7 10*3/uL (ref 1.7–7.7)
Neutrophils Relative %: 63 %
Platelets: 245 10*3/uL (ref 150–400)
RBC: 3.79 MIL/uL — ABNORMAL LOW (ref 3.87–5.11)
RDW: 16 % — ABNORMAL HIGH (ref 11.5–15.5)
WBC: 5.9 10*3/uL (ref 4.0–10.5)
nRBC: 0 % (ref 0.0–0.2)

## 2020-05-30 LAB — COMPREHENSIVE METABOLIC PANEL
ALT: 9 U/L (ref 0–44)
AST: 23 U/L (ref 15–41)
Albumin: 3 g/dL — ABNORMAL LOW (ref 3.5–5.0)
Alkaline Phosphatase: 127 U/L — ABNORMAL HIGH (ref 38–126)
Anion gap: 17 — ABNORMAL HIGH (ref 5–15)
BUN: 22 mg/dL — ABNORMAL HIGH (ref 6–20)
CO2: 28 mmol/L (ref 22–32)
Calcium: 9.3 mg/dL (ref 8.9–10.3)
Chloride: 92 mmol/L — ABNORMAL LOW (ref 98–111)
Creatinine, Ser: 5.39 mg/dL — ABNORMAL HIGH (ref 0.44–1.00)
GFR, Estimated: 10 mL/min — ABNORMAL LOW (ref >60)
Glucose, Bld: 130 mg/dL — ABNORMAL HIGH (ref 70–99)
Potassium: 4.8 mmol/L (ref 3.5–5.1)
Sodium: 137 mmol/L (ref 135–145)
Total Bilirubin: 1.7 mg/dL — ABNORMAL HIGH (ref 0.3–1.2)
Total Protein: 7.7 g/dL (ref 6.5–8.1)

## 2020-05-30 LAB — RESPIRATORY PANEL BY RT PCR (FLU A&B, COVID)
Influenza A by PCR: NEGATIVE
Influenza B by PCR: NEGATIVE
SARS Coronavirus 2 by RT PCR: NEGATIVE

## 2020-05-30 LAB — LACTATE DEHYDROGENASE: LDH: 121 U/L (ref 98–192)

## 2020-05-30 LAB — I-STAT BETA HCG BLOOD, ED (MC, WL, AP ONLY): I-stat hCG, quantitative: <5 m[IU]/mL (ref <5)

## 2020-05-30 LAB — GLUCOSE, CAPILLARY: Glucose-Capillary: 86 mg/dL (ref 70–99)

## 2020-05-30 LAB — CBG MONITORING, ED: Glucose-Capillary: 88 mg/dL (ref 70–99)

## 2020-05-30 MED ORDER — ACETAMINOPHEN 650 MG RE SUPP: 650.0000 mg | Freq: Four times a day (QID) | RECTAL | Status: DC | PRN

## 2020-05-30 MED ORDER — ROSUVASTATIN CALCIUM 5 MG PO TABS
10.0000 mg | ORAL_TABLET | Freq: Every day | ORAL | Status: DC
  Administered 2020-05-30 – 2020-06-02 (×4): 10 mg via ORAL
  Filled 2020-05-30 (×4): qty 2

## 2020-05-30 MED ORDER — LOSARTAN POTASSIUM 50 MG PO TABS: 50.0000 mg | ORAL_TABLET | Freq: Every evening | ORAL | Status: DC

## 2020-05-30 MED ORDER — DOXERCALCIFEROL 4 MCG/2ML IV SOLN
2.0000 ug | INTRAVENOUS | Status: DC
  Administered 2020-05-31 – 2020-06-02 (×2): 2 ug via INTRAVENOUS
  Filled 2020-05-30 (×2): qty 2

## 2020-05-30 MED ORDER — LOSARTAN POTASSIUM 50 MG PO TABS
50.0000 mg | ORAL_TABLET | Freq: Every evening | ORAL | Status: DC
  Administered 2020-05-30 – 2020-06-02 (×4): 50 mg via ORAL
  Filled 2020-05-30 (×4): qty 1

## 2020-05-30 MED ORDER — LABETALOL HCL 300 MG PO TABS
300.0000 mg | ORAL_TABLET | Freq: Two times a day (BID) | ORAL | Status: DC
  Administered 2020-05-30 – 2020-05-31 (×2): 300 mg via ORAL
  Filled 2020-05-30 (×4): qty 1

## 2020-05-30 MED ORDER — CALCIUM ACETATE (PHOS BINDER) 667 MG PO CAPS
667.0000 mg | ORAL_CAPSULE | Freq: Three times a day (TID) | ORAL | Status: DC
  Administered 2020-05-31 – 2020-06-03 (×10): 667 mg via ORAL
  Filled 2020-05-30 (×11): qty 1

## 2020-05-30 MED ORDER — CHLORHEXIDINE GLUCONATE CLOTH 2 % EX PADS
6.0000 | MEDICATED_PAD | Freq: Every day | CUTANEOUS | Status: DC
  Administered 2020-05-31 – 2020-06-01 (×2): 6 via TOPICAL

## 2020-05-30 MED ORDER — HEPARIN SODIUM (PORCINE) 5000 UNIT/ML IJ SOLN
5000.0000 [IU] | Freq: Three times a day (TID) | INTRAMUSCULAR | Status: DC
  Administered 2020-05-30 – 2020-06-03 (×9): 5000 [IU] via SUBCUTANEOUS
  Filled 2020-05-30 (×7): qty 1

## 2020-05-30 MED ORDER — AMLODIPINE BESYLATE 10 MG PO TABS
10.0000 mg | ORAL_TABLET | Freq: Every day | ORAL | Status: DC
  Administered 2020-05-30 – 2020-06-02 (×4): 10 mg via ORAL
  Filled 2020-05-30 (×4): qty 1

## 2020-05-30 MED ORDER — CINACALCET HCL 30 MG PO TABS
30.0000 mg | ORAL_TABLET | ORAL | Status: DC
  Filled 2020-05-30: qty 1

## 2020-05-30 MED ORDER — INSULIN PUMP
Freq: Three times a day (TID) | SUBCUTANEOUS | Status: DC
  Filled 2020-05-30: qty 1

## 2020-05-30 MED ORDER — ACETAMINOPHEN 325 MG PO TABS
650.0000 mg | ORAL_TABLET | Freq: Four times a day (QID) | ORAL | Status: DC | PRN
  Administered 2020-06-01 – 2020-06-02 (×2): 650 mg via ORAL
  Filled 2020-05-30 (×3): qty 2

## 2020-05-30 NOTE — ED Notes (Signed)
Patient transported to Ultrasound 

## 2020-05-30 NOTE — H&P (Signed)
FMTS Attending Admission Note: Faith Netters MD Personal pager:  [phone] FPTS Service Pager:  [phone]  I have seen and examined this patient, and reviewed their chart. I have discussed this patient with the resident. I agree with the resident's findings, assessment and care plan.  Additionally:  Presents with dyspnea, found to have large R pleural effusion. Plan is for thoracentesis via IR.   Query whether cause is severe RV dysfunction secondary to tricuspid valve regurgitation, vs some other cause of unilateral pleural effusion. Will certainly need pleural fluid analysis labs obtained. Has appointment to establish with Dr. Haroldine Laws in CHF clinic in January; consider consulting CHF team/cardiology inpatient if symptoms do not resolve with drainage.  Leeanne Rio, MD 05/30/20  -----------------------------------------------------------------   [facility] Admission History and Physical Service Pager: [phone]  Patient name: [name] Medical record number: [id number] Date of birth: 18-Dec-1975 Age: 44 y.o. Gender: female  Primary Care Provider: Trey Sailors, PA Consultants: Nephro Code Status: Full Preferred Emergency Contact:  Contact Information    Name Relation Home Work [place] [phone]   Nichola Sizer [phone]       Chief Complaint: Difficulty Breathing  Assessment and Plan: [name] is a 44 y.o. female presenting with . PMH is significant for Type 1 DM, Hypertension, Hyperlipidemia, Ascites, ESRD on Dialysis TThS, and severe tricuspid regurgitation.  Dyspnea / Pleural Effusion Patient presented with increasing dyspnea over the last few weeks associated with dry cough/orthopnea. Not improved after receiving dialysis. Currently hemodynamically stable.  On PE, O2 Sats > 95 on 2L O2 LFNC and has decreased lung sounds RLL & RML with Egophany. CBC-  WNL & COVID negative. On chest x-ray, large right pleural effusion with marked atelectasis with primary vascular congestion.  Patient's dyspnea most likely due in large part to pleural effusion and less likely due to volume overloaded patient as patient is compliant with HD and does not appear volume overloaded on exam today.  Differential for pleural effusion includes malignancy, bacterial pneumonia, cirrhosis, viral infection. Less likely to be infectious process as has no symptoms or objective signs concerning for systemic infection, though want to rule out Empyema. Less likely cardiac given unilateral pleural effusion though some concern for given Right Ventricle enlargment and Tricuspid Regurg.  Will need to determine transudative vs exudative process with pleural effusion labs.  Concern for Cirrhosis or other liver process as source given patient's enlarged liver and right sided effusion as source of effusion.  Has Wells Score of 0 so PE less likely.  - Admit to FPTS, Med-tele, Attending Dr Ardelia Mems - Continuous Pulse Oximetry, O2 PRN to keep sats >92%, Vital Signs q4hr  - IR consulted, for thoracentesis - Follow up pleural fluid labs   Abdominal Distension Patient reports that she started to have abdominal distention earlier this year.  It is noted on an H&P in April 2021.  In ultrasound during that visit showed ascites & mild increased echogenicity of the liver which may reflect hepatic steatosis.  On bedside ultrasound in ED, hepatomegaly appreciated with no large fluid pocket. On physical exam, + BS, no TTP or fluid wave.  Labs s/f ALP 127, AST/ALT 23/9, total bili 1.7, platelets 245, INR not obtained. Suspect patient's abdominal distention and hepatomegaly are secondary to right heart failure in the setting of patient's severe Tricuspid Regurgitation seen on TEE in 01/2020. We will also obtain complete abdominal ultrasound to rule out cirrhosis and other possible  abdominal pathology. No plans for  paracentesis at this time.  - Will obtain formal Abdominal Ultrasound - AM Hepatic function panel ordered  Tricuspid Regurgitation Patient previously seen by Dr. Einar Gip on 02/20/20 who reviewed the images of TTE and TEE that were obtained after admission (01/2020)for AMS w/ hypoglycemia, pneumonia and bactermia w/ Streptococcus vestibularis.  He noted secondary pulmonary hypertension was most likely due to longstanding hypertension and patient's labetalol was increased to 300 mg twice daily.  He recommended seeing patient back on an as-needed basis only.  At her last visit on 8/2/21Seen on TEE in 01/2020.  Patient had appointment with Dr. Haroldine Laws on 05/21/20 but no showed. She has been rescheduled for 07/28/19.  Also reduced right Ventricle EF and - Consult Cards in morning  Hypertension BP's currently normotensive.  At home on Amlodipine 10 mg qd, Losartan 50 mg and Labetalol 300 mg.   - Continue Labetalol 300 mg BID - Hold Amlodipine and Losatan  ESRD on Hemodialysis Receives Dialysis T[facility] at [facility].  Patient reports compliance with and only occasionally misses days. Will get Dialysis tomorrow.  - Nephro following, appreciate Recs - AM RFP   Type 1 Diabetes Mellitis Has Insulin pump.  02/08/20 A1C 6.2.  - Continue Insulin pump, pharmacy consulted - AM A1C  Hyperlipidemia Patient on rosuvastatin 10 mg at home -Continue home rosuvastatin  Anemia, normocytic Hemoglobin 10.7, at baseline.   Elevated ALP  Patient does not have any TTP or abominable pain. Can be elevated in the setting of patient's ESRD.  AST and ALT within normal limits. We will follow-up abdominal ultrasound to evaluate for cholestatic disease. - AM LFT - f/u US   FEN/GI: Carb controlled/renal Prophylaxis: Heparin  Disposition: Med Tele  History of Present Illness:  [name] is a 44 y.o. female presenting with difficulty breathing.  This has been going on for past few weeks.  Indicates dyspnea is most  notable when on dialysis and when up moving around.  Feels better when getting O2 at Dialysis.  Went to see doctor given recent onset dyspnea and found to have O2 sats in 80's and was sent to the ED. Also endorses orthopnea and dry cough.  Denies any sick contacts.  Denies fevers chills nausea vomiting diarrhea constipation.  Has had COVID vaccine.  No smoking history. No history of ashtma or COPD. Currently resting comfortably in ED on 2 L of nasal cannula O2.  Also complains of abdominal distention and swelling in legs. Has had abdominal distention in the past and previous paracentesis.  No history of liver disease, though hepatomegaly noted on bedside ultrasound by EDP.  Distention does not decrease with dialysis. Denies any abdominal pain, just tightness.  Review Of Systems: Per HPI with the following additions:   Review of Systems  Constitutional: Negative for activity change, appetite change, chills and fever.  HENT: Negative for rhinorrhea and sore throat.   Respiratory: Positive for cough and shortness of breath. Negative for chest tightness.   Cardiovascular: Positive for leg swelling. Negative for chest pain.  Gastrointestinal: Positive for abdominal distention. Negative for abdominal pain, blood in stool, constipation, diarrhea, nausea and vomiting.  Skin: Negative for rash.  Neurological: Negative for syncope.     Patient Active Problem List   Diagnosis Date Noted  . Ascites 03/28/2020  . Hypothermia   . Bacteremia 02/11/2020  . Hypoglycemia 02/08/2020  . Hemorrhage from arteriovenous dialysis graft ([place]) 10/20/2019  . Hyperkalemia 04/15/2018  . Nausea 04/15/2018  . Cerebral  infarction due to thrombosis of basilar artery ([place]) 03/11/2017  . Left hand pain 08/25/2016  . Hyperparathyroidism, secondary renal ([place])   . Benign essential HTN   . Pneumonia 09/12/2014  . CAP (community acquired pneumonia) 09/12/2014  . ESRD on hemodialysis ([place]) 09/12/2014  . Diabetes mellitus  type I ([place]) 09/12/2014  . History of diabetic retinopathy 09/12/2014  . HCAP (healthcare-associated pneumonia) 09/11/2014  . Cerebral infarction due to unspecified mechanism 05/22/2014  . Hypertensive emergency 05/22/2014  . Essential hypertension, benign 05/22/2014  . DM (diabetes mellitus) ([place]) 05/22/2014  . HLD (hyperlipidemia) 05/22/2014  . Cerebral infarction ([place]) 03/29/2014  . Facial droop 03/29/2014  . Mechanical complication of other vascular device, implant, and graft 01/12/2013  . Other complications due to renal dialysis device, implant, and graft 12/08/2012  . ESRD on hemodialysis ([place]) 04/18/2011    Past Medical History: Past Medical History:  Diagnosis Date  . Anemia   . Arthritis   . Ascites 03/28/2020  . Diabetes mellitus   . Diabetic retinopathy ([place])    Hx of laser Rx's  . DM type 1 (diabetes mellitus, type 1) ([place]) 09/12/2014   Age of onset for DM type 1 was age 29.     . Enlarged thyroid gland   . ESRD on hemodialysis ([place])    ESRD due to DM type I, age of onset DM 1 was age 67.  Went on dialysis in March 2012.  Gets HD now at [facility] on a TTS schedule.    Marland Kitchen ESRD on hemodialysis ([place]) 09/12/2014   ESRD due to DM type 1.  Started HD in 2012 at [facility].  Gets HD there on a TTS schedule.    Marland Kitchen GERD (gastroesophageal reflux disease)   . History of blood transfusion   . Hyperlipidemia   . Hypertension   . Peripheral vascular disease ([place])   . Pneumonia     Past Surgical History: Past Surgical History:  Procedure Laterality Date  . ARTERIOVENOUS GRAFT PLACEMENT    . ARTERY REPAIR Left 12/10/2012   Procedure: BRACHIAL ARTERY REPAIR;  Surgeon: Angelia Mould, MD;  Location: [facility] OR;  Service: Vascular;  Laterality: Left;  Exploration Left Brachial Artery for AVF  . AV FISTULA PLACEMENT Right 01/11/2018   Procedure: INSERTION OF ARTERIOVENOUS (AV) GORE-TEX GRAFT  UPPER ARM;  Surgeon: Angelia Mould, MD;  Location: [facility];  Service: Vascular;   Laterality: Right;  . BASCILIC VEIN TRANSPOSITION Right 09/28/2017   Procedure: FIRST STAGE BASILIC VEIN TRANSPOSITION;  Surgeon: Angelia Mould, MD;  Location: [facility];  Service: Vascular;  Laterality: Right;  . CESAREAN SECTION    . EYE SURGERY     [place]  . EYE SURGERY Left   . REVISION OF ARTERIOVENOUS GORETEX GRAFT Left 08/01/2016   Procedure: REVISION OF ARTERIOVENOUS GORETEX GRAFT;  Surgeon: Angelia Mould, MD;  Location: [facility];  Service: Vascular;  Laterality: Left;  . REVISION OF ARTERIOVENOUS GORETEX GRAFT Right 10/21/2019   Procedure: REVISION OF ARTERIOVENOUS GORETEX GRAFT ARM;  Surgeon: Rosetta Posner, MD;  Location: [facility];  Service: Vascular;  Laterality: Right;  . SHUNTOGRAM Left 11/30/2013   Procedure: SHUNTOGRAM;  Surgeon: Serafina Mitchell, MD;  Location[facility] CATH LAB;  Service: Cardiovascular;  Laterality: Left;  . TEE WITHOUT CARDIOVERSION N/A 02/13/2020   Procedure: TRANSESOPHAGEAL ECHOCARDIOGRAM (TEE);  Surgeon: Josue Hector, MD;  Location: [facility] ENDOSCOPY;  Service: Cardiovascular;  Laterality: N/A;    Social History: Social History   Tobacco  Use  . Smoking status: Never Smoker  . Smokeless tobacco: Never Used  Vaping Use  . Vaping Use: Never used  Substance Use Topics  . Alcohol use: No    Alcohol/week: 0.0 standard drinks  . Drug use: No   Additional social history: Please also refer to relevant sections of EMR.  Family History: Family History  Problem Relation Age of Onset  . Cancer Mother        breast  . Hypertension Father      Allergies and Medications: Allergies  Allergen Reactions  . Pollen Extract Other (See Comments)    Watery eyes   No current facility-administered medications on file prior to encounter.   Current Outpatient Medications on File Prior to Encounter  Medication Sig Dispense Refill  . amLODipine (NORVASC) 10 MG tablet Take 10 mg by mouth at bedtime.     . calcium acetate (PHOSLO) 667 MG capsule Take 667 mg by mouth  3 (three) times daily with meals. May take an additional 667 mg dose with snacks    . GLUCAGON EMERGENCY 1 MG injection Inject 1 mg into the muscle once as needed (for severe low blood sugar).     . insulin aspart (NOVOLOG) 100 UNIT/ML injection Inject 50 Units into the skin See admin instructions. Use with insulin pump daily    . Insulin Human (INSULIN PUMP) SOLN Inject into the skin continuous. Novolog   Basal rate 0.29 units/ hour    . labetalol (NORMODYNE) 300 MG tablet TAKE 1 TABLET(300 MG) BY MOUTH TWICE DAILY (Patient taking differently: Take 300 mg by mouth daily. ) 180 tablet 1  . losartan (COZAAR) 50 MG tablet Take 50 mg by mouth every evening.     . rosuvastatin (CRESTOR) 10 MG tablet Take 10 mg by mouth at bedtime.       Objective: BP 119/62 (BP Location: Right Arm)   Pulse 77   Temp 98.6 F (37 C) (Oral)   Resp (!) 25   SpO2 100%  Exam:  Physical Exam Constitutional:      General: She is not in acute distress.    Appearance: She is well-developed. She is not ill-appearing.  HENT:     Head: Normocephalic.     Mouth/Throat:     Mouth: Mucous membranes are moist.  Eyes:     Extraocular Movements: Extraocular movements intact.     Pupils: Pupils are equal, round, and reactive to light.  Cardiovascular:     Rate and Rhythm: Normal rate and regular rhythm.     Pulses: Normal pulses.     Heart sounds: Murmur heard.      Comments: Systolic Murmur best heard at lower right sternal border Pulmonary:     Effort: Pulmonary effort is normal. No accessory muscle usage or respiratory distress.     Breath sounds: Examination of the right-upper field reveals decreased breath sounds. Examination of the right-middle field reveals decreased breath sounds. Examination of the right-lower field reveals decreased breath sounds. Decreased breath sounds present. No wheezing or rales.     Comments: Egophany heard on middle left lung side Chest:     Chest wall: No tenderness.  Abdominal:      General: There is distension.     Palpations: Abdomen is soft. There is no fluid wave.     Tenderness: There is no abdominal tenderness.  Musculoskeletal:     Right lower leg: Edema present.     Left lower leg: Edema present.  Comments: Mild non-pitting edema in both legs  Skin:    General: Skin is warm.     Capillary Refill: Capillary refill takes less than 2 seconds.  Neurological:     General: No focal deficit present.     Mental Status: She is alert.  Psychiatric:        Mood and Affect: Mood normal.        Behavior: Behavior normal.     Labs and Imaging: CBC BMET  Recent Labs  Lab 05/30/20 1352  WBC 5.9  HGB 10.7*  HCT 34.6*  PLT 245   Recent Labs  Lab 05/30/20 1352  NA 137  K 4.8  CL 92*  CO2 28  BUN 22*  CREATININE 5.39*  GLUCOSE 130*  CALCIUM 9.3     EKG: Normal Sinus rhthym  DG Chest 2 View  Result Date: 05/30/2020 CLINICAL DATA:  Shortness of breath for several weeks, question recurrent pneumonia; past history diabetes mellitus, hypertension, end-stage renal disease, former smoker EXAM: CHEST - 2 VIEW COMPARISON:  02/10/2020 FINDINGS: Enlargement of cardiac silhouette with pulmonary vascular congestion. Stable mediastinal contours. Minimal interstitial edema. Large RIGHT pleural effusion with complete atelectasis of RIGHT middle and RIGHT lower lobes and partial atelectasis of RIGHT upper lobe. Subsegmental atelectasis LEFT base. No pneumothorax or acute osseous findings. Bones demineralized. IMPRESSION: Enlargement of cardiac silhouette with pulmonary vascular congestion and mild pulmonary edema. Large RIGHT pleural effusion with marked atelectasis of RIGHT lung. Minimal subsegmental atelectasis LEFT base. Electronically Signed   By: Lavonia Dana M.D.   On: 05/30/2020 14:29   US Abdomen Complete  Result Date: 05/30/2020 CLINICAL DATA:  Ascites.  Abdominal tenderness EXAM: ABDOMEN ULTRASOUND COMPLETE COMPARISON:  Ultrasound 10/20/2018 FINDINGS:  Gallbladder: Gallbladder is nondistended. There is a rim calcification in the wall the gallbladder. There is gallbladder sludge layering dependently. Significant gallbladder wall thickening up to 6 8 mm. Negative sonographic Murphy's sign. Common bile duct: Diameter: Normal at 3 mm Liver: No focal lesion identified. Within normal limits in parenchymal echogenicity. Portal vein is patent on color Doppler imaging with normal direction of blood flow towards the liver. IVC: No abnormality visualized. Pancreas: Visualized portion unremarkable. Spleen: Size and appearance within normal limits. Right Kidney: Length: 7.7 cm. Echogenicity within normal limits. No mass or hydronephrosis visualized. Left Kidney: Length: 6.8 cm. Echogenicity within normal limits. No mass or hydronephrosis visualized. Abdominal aorta: No aneurysm visualized. Other findings: Moderate volume RIGHT pleural effusion. Small volume ascites. IMPRESSION: 1. Abnormal gallbladder with significant gallbladder wall thickening, calcifications in the wall, and sludge within the gallbladder. Favor chronic findings. Consider chronic cholecystitis. Negative sonographic Murphy's sign. 2. Moderate RIGHT pleural effusion ascites. Electronically Signed   By: Suzy Bouchard M.D.   On: 05/30/2020 19:35    Delora Fuel, MD 05/30/2020, 3:46 PM PGY-1, [place] Intern pager: [phone], text pages welcome  FPTS Upper-Level Resident Addendum I have independently interviewed and examined the patient. I have discussed the above with the original author and agree with their documentation. My edits for correction/addition/clarification are in - purple. Please see also any attending notes.  [place] Service pager: [phone] (text pages welcome through [facility])  Wilber Oliphant, M.D.  PGY-3 05/30/2020 7:42 PM

## 2020-05-30 NOTE — ED Triage Notes (Signed)
Pt here from her PCP for further eval of low oxygen levels and abdominal distention. Pt's SpO2 87% on room air with improvement to 100% on 2L. Pt endorses shob x 2 weeks. Abdomen distended, hx of ascites.

## 2020-05-30 NOTE — ED Provider Notes (Signed)
[facility] EMERGENCY DEPARTMENT Provider Note   CSN: [id number] Arrival date & time: 05/30/20  1333     History Chief Complaint  Patient presents with  . Shortness of Breath    [name] is a 44 y.o. female hx of DM, ESRD on HD (last HD was yesterday), HL, HTN, here presenting with shortness of breath.  Patient has been having shortness of breath for the last week or so.  Patient also has some dull abdominal distention as well.  Patient went to doctor's office and her oxygen level was around 87% on room air.  Patient is not on oxygen at home.  Patient came here for further evaluation.  Denies any fevers or chills or cough.  Patient has known ascites  The history is provided by the patient.       Past Medical History:  Diagnosis Date  . Anemia   . Arthritis   . Ascites 03/28/2020  . Diabetes mellitus   . Diabetic retinopathy ([place])    Hx of laser Rx's  . DM type 1 (diabetes mellitus, type 1) ([place]) 09/12/2014   Age of onset for DM type 1 was age 89.     . Enlarged thyroid gland   . ESRD on hemodialysis ([place])    ESRD due to DM type I, age of onset DM 1 was age 68.  Went on dialysis in March 2012.  Gets HD now at [facility] on a TTS schedule.    Marland Kitchen ESRD on hemodialysis ([place]) 09/12/2014   ESRD due to DM type 1.  Started HD in 2012 at [facility].  Gets HD there on a TTS schedule.    Marland Kitchen GERD (gastroesophageal reflux disease)   . History of blood transfusion   . Hyperlipidemia   . Hypertension   . Peripheral vascular disease ([place])   . Pneumonia     Patient Active Problem List   Diagnosis Date Noted  . Ascites 03/28/2020  . Hypothermia   . Bacteremia 02/11/2020  . Hypoglycemia 02/08/2020  . Hemorrhage from arteriovenous dialysis graft ([place]) 10/20/2019  . Hyperkalemia 04/15/2018  . Nausea 04/15/2018  . Cerebral infarction due to thrombosis of basilar artery ([place]) 03/11/2017  . Left hand pain 08/25/2016  . Hyperparathyroidism, secondary renal ([place])    . Benign essential HTN   . Pneumonia 09/12/2014  . CAP (community acquired pneumonia) 09/12/2014  . ESRD on hemodialysis ([place]) 09/12/2014  . Diabetes mellitus type I ([place]) 09/12/2014  . History of diabetic retinopathy 09/12/2014  . HCAP (healthcare-associated pneumonia) 09/11/2014  . Cerebral infarction due to unspecified mechanism 05/22/2014  . Hypertensive emergency 05/22/2014  . Essential hypertension, benign 05/22/2014  . DM (diabetes mellitus) ([place]) 05/22/2014  . HLD (hyperlipidemia) 05/22/2014  . Cerebral infarction ([place]) 03/29/2014  . Facial droop 03/29/2014  . Mechanical complication of other vascular device, implant, and graft 01/12/2013  . Other complications due to renal dialysis device, implant, and graft 12/08/2012  . ESRD on hemodialysis ([place]) 04/18/2011    Past Surgical History:  Procedure Laterality Date  . ARTERIOVENOUS GRAFT PLACEMENT    . ARTERY REPAIR Left 12/10/2012   Procedure: BRACHIAL ARTERY REPAIR;  Surgeon: Angelia Mould, MD;  Location: [facility] OR;  Service: Vascular;  Laterality: Left;  Exploration Left Brachial Artery for AVF  . AV FISTULA PLACEMENT Right 01/11/2018   Procedure: INSERTION OF ARTERIOVENOUS (AV) GORE-TEX GRAFT  UPPER ARM;  Surgeon: Angelia Mould, MD;  Location: [facility];  Service: Vascular;  Laterality: Right;  .  BASCILIC VEIN TRANSPOSITION Right 09/28/2017   Procedure: FIRST STAGE BASILIC VEIN TRANSPOSITION;  Surgeon: Angelia Mould, MD;  Location: [facility];  Service: Vascular;  Laterality: Right;  . CESAREAN SECTION    . EYE SURGERY     [place]  . EYE SURGERY Left   . REVISION OF ARTERIOVENOUS GORETEX GRAFT Left 08/01/2016   Procedure: REVISION OF ARTERIOVENOUS GORETEX GRAFT;  Surgeon: Angelia Mould, MD;  Location: [facility];  Service: Vascular;  Laterality: Left;  . REVISION OF ARTERIOVENOUS GORETEX GRAFT Right 10/21/2019   Procedure: REVISION OF ARTERIOVENOUS GORETEX GRAFT ARM;  Surgeon: Rosetta Posner, MD;  Location: [facility];   Service: Vascular;  Laterality: Right;  . SHUNTOGRAM Left 11/30/2013   Procedure: SHUNTOGRAM;  Surgeon: Serafina Mitchell, MD;  Location: [facility] CATH LAB;  Service: Cardiovascular;  Laterality: Left;  . TEE WITHOUT CARDIOVERSION N/A 02/13/2020   Procedure: TRANSESOPHAGEAL ECHOCARDIOGRAM (TEE);  Surgeon: Josue Hector, MD;  Location: [facility] ENDOSCOPY;  Service: Cardiovascular;  Laterality: N/A;     OB History    Gravida  0   Para  0   Term  0   Preterm  0   AB  0   Living        SAB  0   TAB  0   Ectopic  0   Multiple      Live Births              Family History  Problem Relation Age of Onset  . Cancer Mother        breast  . Hypertension Father     Social History   Tobacco Use  . Smoking status: Never Smoker  . Smokeless tobacco: Never Used  Vaping Use  . Vaping Use: Never used  Substance Use Topics  . Alcohol use: No    Alcohol/week: 0.0 standard drinks  . Drug use: No    Home Medications Prior to Admission medications   Medication Sig Start Date End Date Taking? Authorizing Provider  amLODipine (NORVASC) 10 MG tablet Take 10 mg by mouth at bedtime.    Yes [provider]  calcium acetate (PHOSLO) 667 MG capsule Take 667 mg by mouth 3 (three) times daily with meals. May take an additional 667 mg dose with snacks   Yes [provider]  GLUCAGON EMERGENCY 1 MG injection Inject 1 mg into the muscle once as needed (for severe low blood sugar).  11/22/12   [provider]  insulin aspart (NOVOLOG) 100 UNIT/ML injection Inject 50 Units into the skin See admin instructions. Use with insulin pump daily 02/17/18   [provider]  Insulin Human (INSULIN PUMP) SOLN Inject into the skin continuous. Novolog   Basal rate 0.29 units/ hour    [provider]  labetalol (NORMODYNE) 300 MG tablet TAKE 1 TABLET(300 MG) BY MOUTH TWICE DAILY 02/20/20   Adrian Prows, MD  loratadine (CLARITIN) 10 MG tablet Take 10 mg by mouth daily as needed  for allergies.    [provider]  losartan (COZAAR) 25 MG tablet Take 25 mg by mouth in the morning and at bedtime.    [provider]  losartan (COZAAR) 50 MG tablet Take 50 mg by mouth daily. 05/11/20   [provider]  Pediatric Multiple Vit-C-FA (FLINSTONES GUMMIES OMEGA-3 DHA) CHEW Chew 2 tablets by mouth in the morning.     [provider]  rosuvastatin (CRESTOR) 10 MG tablet Take 10 mg by mouth at bedtime.  [provider]    Allergies    Pollen extract  Review of Systems   Review of Systems  Respiratory: Positive for shortness of breath.   Gastrointestinal: Positive for abdominal distention.  All other systems reviewed and are negative.   Physical Exam Updated Vital Signs BP 116/67   Pulse 77   Temp 98.6 F (37 C) (Oral)   Resp (!) 23   SpO2 100%   Physical Exam Vitals and nursing note reviewed.  Constitutional:      Comments: Chronically ill.  HENT:     Head: Normocephalic.  Eyes:     Extraocular Movements: Extraocular movements intact.     Pupils: Pupils are equal, round, and reactive to light.  Cardiovascular:     Rate and Rhythm: Normal rate and regular rhythm.  Pulmonary:     Comments: Absent breath sounds on the right side.  Normal breath sounds on the left side  Abdominal:     Comments: Distended and there is small fluid wave.  Musculoskeletal:        General: Normal range of motion.     Cervical back: Normal range of motion.  Skin:    General: Skin is warm.     Capillary Refill: Capillary refill takes less than 2 seconds.  Neurological:     General: No focal deficit present.     Mental Status: She is oriented to person, place, and time.  Psychiatric:        Mood and Affect: Mood normal.        Behavior: Behavior normal.     ED Results / Procedures / Treatments   Labs (all labs ordered are listed, but only abnormal results are displayed) Labs Reviewed  CBC WITH DIFFERENTIAL/PLATELET - Abnormal;  Notable for the following components:      Result Value   RBC 3.79 (*)    Hemoglobin 10.7 (*)    HCT 34.6 (*)    RDW 16.0 (*)    All other components within normal limits  COMPREHENSIVE METABOLIC PANEL - Abnormal; Notable for the following components:   Chloride 92 (*)    Glucose, Bld 130 (*)    BUN 22 (*)    Creatinine, Ser 5.39 (*)    Albumin 3.0 (*)    Alkaline Phosphatase 127 (*)    Total Bilirubin 1.7 (*)    GFR, Estimated 10 (*)    Anion gap 17 (*)    All other components within normal limits  RESPIRATORY PANEL BY RT PCR (FLU A&B, COVID)  I-STAT BETA HCG BLOOD, ED ([facility], [facility], [facility] ONLY)    EKG EKG Interpretation  Date/Time:  Wednesday May 30 2020 13:50:35 EST Ventricular Rate:  77 PR Interval:  162 QRS Duration: 78 QT Interval:  398 QTC Calculation: 450 R Axis:   90 Text Interpretation: Normal sinus rhythm Rightward axis Nonspecific T wave abnormality Abnormal ECG No significant change since prior 7/21 Confirmed by Aletta Edouard [phone]) on 05/30/2020 2:18:59 PM   Radiology DG Chest 2 View  Result Date: 05/30/2020 CLINICAL DATA:  Shortness of breath for several weeks, question recurrent pneumonia; past history diabetes mellitus, hypertension, end-stage renal disease, former smoker EXAM: CHEST - 2 VIEW COMPARISON:  02/10/2020 FINDINGS: Enlargement of cardiac silhouette with pulmonary vascular congestion. Stable mediastinal contours. Minimal interstitial edema. Large RIGHT pleural effusion with complete atelectasis of RIGHT middle and RIGHT lower lobes and partial atelectasis of RIGHT upper lobe. Subsegmental atelectasis LEFT base. No pneumothorax or acute osseous findings. Bones demineralized. IMPRESSION:  Enlargement of cardiac silhouette with pulmonary vascular congestion and mild pulmonary edema. Large RIGHT pleural effusion with marked atelectasis of RIGHT lung. Minimal subsegmental atelectasis LEFT base. Electronically Signed   By: Lavonia Dana M.D.   On: 05/30/2020  14:29    Procedures Procedures (including critical care time)  EMERGENCY DEPARTMENT [place] ACITES EXAM "Study: Limited Abdominal Ultrasound for Evaluation of Free Fluid"  INDICATIONS: Abd pain  PERFORMED BY: Myself IMAGES ARCHIVED?: Yes VIEWS USES: Right upper quad, Left upper quad, Right lower quad and Left lower quad INTERPRETATION: small ascites, liver is enlarged and extended to RLQ  EMERGENCY DEPARTMENT [place] LUNG EXAM "Study: Limited Ultrasound of the Lung and Thorax"  INDICATIONS: Dyspnea Multiple views of both lungs using sagittal orientation were obtained.  PERFORMED BY: Myself IMAGES ARCHIVED?: Yes LIMITATIONS: Body habitus and Respiratory distress VIEWS USED: Posterior lung fields INTERPRETATION: Pleural effusion    CRITICAL CARE Performed by: Wandra Arthurs   Total critical care time: 30 minutes  Critical care time was exclusive of separately billable procedures and treating other patients.  Critical care was necessary to treat or prevent imminent or life-threatening deterioration.  Critical care was time spent personally by me on the following activities: development of treatment plan with patient and/or surrogate as well as nursing, discussions with consultants, evaluation of patient's response to treatment, examination of patient, obtaining history from patient or surrogate, ordering and performing treatments and interventions, ordering and review of laboratory studies, ordering and review of radiographic studies, pulse oximetry and re-evaluation of patient's condition.       Medications Ordered in ED Medications - No data to display  ED Course  I have reviewed the triage vital signs and the nursing notes.  Pertinent labs & imaging results that were available during my care of the patient were reviewed by me and considered in my medical decision making (see chart for details).    MDM Rules/Calculators/A&P                          [name] is a  44 y.o. female here presenting with shortness of breath and hypoxia.  Patient has no breath sounds on the right side.  I am concerned for possible pleural effusion from ascites.  I was able to perform bedside ultrasound that showed minimal ascites and hepatomegaly.  3:55 PM CXR showed large pleural effusion. Talked to Dr. Bethel Born from IR and ordered IR thoracentesis and will need fluid analysis.  Potassium is normal.  I also talked to Dr. Johnney Ou from nephrology to dialyze patient. Family practice to admit for hypoxia from pleural effusion   Final Clinical Impression(s) / ED Diagnoses Final diagnoses:  Pleural effusion    Rx / DC Orders ED Discharge Orders    None       Drenda Freeze, MD 05/30/20 1556

## 2020-05-30 NOTE — Progress Notes (Addendum)
[facility] Admission History and Physical Service Pager: [phone]  Patient name: [name] Medical record number: [id number] Date of birth: 10-31-1975 Age: 44 y.o. Gender: female  Primary Care Provider: Trey Sailors, PA Consultants: Nephro Code Status: Full Preferred Emergency Contact:  Contact Information    Name Relation Home Work [place] [phone]   Nichola Sizer [phone]       Chief Complaint: Difficulty Breathing  Assessment and Plan: [name] is a 44 y.o. female presenting with . PMH is significant for Type 1 DM, Hypertension, Hyperlipidemia, Ascites, ESRD on Dialysis TThS, and severe tricuspid regurgitation.  Dyspnea / Pleural Effusion Patient presented with increasing dyspnea over the last few weeks associated with dry cough/orthopnea. Not improved after receiving dialysis. Currently hemodynamically stable.  On PE, O2 Sats > 95 on 2L O2 LFNC and has decreased lung sounds RLL & RML with Egophany. CBC- WNL & COVID negative. On chest x-ray, large right pleural effusion with marked atelectasis with primary vascular congestion.  Patient's dyspnea most likely due in large part to pleural effusion and less likely due to volume overloaded patient as patient is compliant with HD and does not appear volume overloaded on exam today.  Differential for pleural effusion includes malignancy, bacterial pneumonia, cirrhosis, viral infection. Less likely to be infectious process as has no symptoms or objective signs concerning for systemic infection, though want to rule out Empyema. Less likely cardiac given unilateral pleural effusion though some concern for given Right Ventricle enlargment and Tricuspid Regurg.  Will need to determine transudative vs exudative process with pleural effusion labs.  Concern for Cirrhosis or other liver process as source given patient's enlarged liver and  right sided effusion as source of effusion.  Has Wells Score of 0 so PE less likely.  - Admit to FPTS, Med-tele, Attending Dr Ardelia Mems - Continuous Pulse Oximetry, O2 PRN to keep sats >92%, Vital Signs q4hr  - IR consulted, for thoracentesis - Follow up pleural fluid labs   Abdominal Distension Patient reports that she started to have abdominal distention earlier this year.  It is noted on an H&P in April 2021.  In ultrasound during that visit showed ascites & mild increased echogenicity of the liver which may reflect hepatic steatosis.  On bedside ultrasound in ED, hepatomegaly appreciated with no large fluid pocket. On physical exam, + BS, no TTP or fluid wave.  Labs s/f ALP 127, AST/ALT 23/9, total bili 1.7, platelets 245, INR not obtained. Suspect patient's abdominal distention and hepatomegaly are secondary to right heart failure in the setting of patient's severe Tricuspid Regurgitation seen on TEE in 01/2020. We will also obtain complete abdominal ultrasound to rule out cirrhosis and other possible abdominal pathology. No plans for paracentesis at this time.  - Will obtain formal Abdominal Ultrasound - AM Hepatic function panel ordered  Tricuspid Regurgitation Patient previously seen by Dr. Einar Gip on 02/20/20 who reviewed the images of TTE and TEE that were obtained after admission (01/2020)for AMS w/ hypoglycemia, pneumonia and bactermia w/ Streptococcus vestibularis.  He noted secondary pulmonary hypertension was most likely due to longstanding hypertension and patient's labetalol was increased to 300 mg twice daily.  He recommended seeing patient back on an as-needed basis only.  At her last visit on 8/2/21Seen on TEE in 01/2020.  Patient had appointment with Dr. Haroldine Laws on 05/21/20 but no showed. She has been rescheduled for 07/28/19.  Also reduced right Ventricle EF and - Consult Cards  in morning  Hypertension BP's currently normotensive.  At home on Amlodipine 10 mg qd, Losartan 50 mg and  Labetalol 300 mg.   - Continue Labetalol 300 mg BID - Hold Amlodipine and Losatan  ESRD on Hemodialysis Receives Dialysis TThS at [facility].  Patient reports compliance with and only occasionally misses days. Will get Dialysis tomorrow.  - Nephro following, appreciate Recs - AM RFP   Type 1 Diabetes Mellitis Has Insulin pump.  02/08/20 A1C 6.2.  - Continue Insulin pump, pharmacy consulted - AM A1C  Hyperlipidemia Patient on rosuvastatin 10 mg at home -Continue home rosuvastatin  Anemia, normocytic Hemoglobin 10.7, at baseline.   Elevated ALP  Patient does not have any TTP or abominable pain. Can be elevated in the setting of patient's ESRD.  AST and ALT within normal limits. We will follow-up abdominal ultrasound to evaluate for cholestatic disease. - AM LFT - f/u US   FEN/GI: Carb controlled/renal Prophylaxis: Heparin  Disposition: Med Tele  History of Present Illness:  [name] is a 44 y.o. female presenting with difficulty breathing.  This has been going on for past few weeks.  Indicates dyspnea is most notable when on dialysis and when up moving around.  Feels better when getting O2 at Dialysis.  Went to see doctor given recent onset dyspnea and found to have O2 sats in 80's and was sent to the ED. Also endorses orthopnea and dry cough.  Denies any sick contacts.  Denies fevers chills nausea vomiting diarrhea constipation.  Has had COVID vaccine.  No smoking history. No history of ashtma or COPD. Currently resting comfortably in ED on 2 L of nasal cannula O2.  Also complains of abdominal distention and swelling in legs. Has had abdominal distention in the past and previous paracentesis.  No history of liver disease, though hepatomegaly noted on bedside ultrasound by EDP.  Distention does not decrease with dialysis. Denies any abdominal pain, just tightness.  Review Of Systems: Per HPI with the following additions:   Review of Systems  Constitutional: Negative for  activity change, appetite change, chills and fever.  HENT: Negative for rhinorrhea and sore throat.   Respiratory: Positive for cough and shortness of breath. Negative for chest tightness.   Cardiovascular: Positive for leg swelling. Negative for chest pain.  Gastrointestinal: Positive for abdominal distention. Negative for abdominal pain, blood in stool, constipation, diarrhea, nausea and vomiting.  Skin: Negative for rash.  Neurological: Negative for syncope.     Patient Active Problem List   Diagnosis Date Noted  . Ascites 03/28/2020  . Hypothermia   . Bacteremia 02/11/2020  . Hypoglycemia 02/08/2020  . Hemorrhage from arteriovenous dialysis graft ([place]) 10/20/2019  . Hyperkalemia 04/15/2018  . Nausea 04/15/2018  . Cerebral infarction due to thrombosis of basilar artery ([place]) 03/11/2017  . Left hand pain 08/25/2016  . Hyperparathyroidism, secondary renal ([place])   . Benign essential HTN   . Pneumonia 09/12/2014  . CAP (community acquired pneumonia) 09/12/2014  . ESRD on hemodialysis ([place]) 09/12/2014  . Diabetes mellitus type I ([place]) 09/12/2014  . History of diabetic retinopathy 09/12/2014  . HCAP (healthcare-associated pneumonia) 09/11/2014  . Cerebral infarction due to unspecified mechanism 05/22/2014  . Hypertensive emergency 05/22/2014  . Essential hypertension, benign 05/22/2014  . DM (diabetes mellitus) ([place]) 05/22/2014  . HLD (hyperlipidemia) 05/22/2014  . Cerebral infarction ([place]) 03/29/2014  . Facial droop 03/29/2014  . Mechanical complication of other vascular device, implant, and graft 01/12/2013  . Other complications due to renal  dialysis device, implant, and graft 12/08/2012  . ESRD on hemodialysis ([place]) 04/18/2011    Past Medical History: Past Medical History:  Diagnosis Date  . Anemia   . Arthritis   . Ascites 03/28/2020  . Diabetes mellitus   . Diabetic retinopathy ([place])    Hx of laser Rx's  . DM type 1 (diabetes mellitus, type 1) ([place]) 09/12/2014   Age  of onset for DM type 1 was age 57.     . Enlarged thyroid gland   . ESRD on hemodialysis ([place])    ESRD due to DM type I, age of onset DM 1 was age 88.  Went on dialysis in March 2012.  Gets HD now at [facility] on a TTS schedule.    Marland Kitchen ESRD on hemodialysis ([place]) 09/12/2014   ESRD due to DM type 1.  Started HD in 2012 at [facility].  Gets HD there on a TTS schedule.    Marland Kitchen GERD (gastroesophageal reflux disease)   . History of blood transfusion   . Hyperlipidemia   . Hypertension   . Peripheral vascular disease ([place])   . Pneumonia     Past Surgical History: Past Surgical History:  Procedure Laterality Date  . ARTERIOVENOUS GRAFT PLACEMENT    . ARTERY REPAIR Left 12/10/2012   Procedure: BRACHIAL ARTERY REPAIR;  Surgeon: Angelia Mould, MD;  Location: [facility] OR;  Service: Vascular;  Laterality: Left;  Exploration Left Brachial Artery for AVF  . AV FISTULA PLACEMENT Right 01/11/2018   Procedure: INSERTION OF ARTERIOVENOUS (AV) GORE-TEX GRAFT  UPPER ARM;  Surgeon: Angelia Mould, MD;  Location: [facility];  Service: Vascular;  Laterality: Right;  . BASCILIC VEIN TRANSPOSITION Right 09/28/2017   Procedure: FIRST STAGE BASILIC VEIN TRANSPOSITION;  Surgeon: Angelia Mould, MD;  Location: [facility];  Service: Vascular;  Laterality: Right;  . CESAREAN SECTION    . EYE SURGERY     [place]  . EYE SURGERY Left   . REVISION OF ARTERIOVENOUS GORETEX GRAFT Left 08/01/2016   Procedure: REVISION OF ARTERIOVENOUS GORETEX GRAFT;  Surgeon: Angelia Mould, MD;  Location: [facility];  Service: Vascular;  Laterality: Left;  . REVISION OF ARTERIOVENOUS GORETEX GRAFT Right 10/21/2019   Procedure: REVISION OF ARTERIOVENOUS GORETEX GRAFT ARM;  Surgeon: Rosetta Posner, MD;  Location: [facility];  Service: Vascular;  Laterality: Right;  . SHUNTOGRAM Left 11/30/2013   Procedure: SHUNTOGRAM;  Surgeon: Serafina Mitchell, MD;  Location[facility] CATH LAB;  Service: Cardiovascular;  Laterality: Left;  . TEE WITHOUT CARDIOVERSION  N/A 02/13/2020   Procedure: TRANSESOPHAGEAL ECHOCARDIOGRAM (TEE);  Surgeon: Josue Hector, MD;  Location[facility] ENDOSCOPY;  Service: Cardiovascular;  Laterality: N/A;    Social History: Social History   Tobacco Use  . Smoking status: Never Smoker  . Smokeless tobacco: Never Used  Vaping Use  . Vaping Use: Never used  Substance Use Topics  . Alcohol use: No    Alcohol/week: 0.0 standard drinks  . Drug use: No   Additional social history: Please also refer to relevant sections of EMR.  Family History: Family History  Problem Relation Age of Onset  . Cancer Mother        breast  . Hypertension Father      Allergies and Medications: Allergies  Allergen Reactions  . Pollen Extract Other (See Comments)    Watery eyes   No current facility-administered medications on file prior to encounter.   Current Outpatient Medications on File Prior to Encounter  Medication Sig Dispense Refill  . amLODipine (NORVASC) 10 MG tablet Take 10 mg by mouth at bedtime.     . calcium acetate (PHOSLO) 667 MG capsule Take 667 mg by mouth 3 (three) times daily with meals. May take an additional 667 mg dose with snacks    . GLUCAGON EMERGENCY 1 MG injection Inject 1 mg into the muscle once as needed (for severe low blood sugar).     . insulin aspart (NOVOLOG) 100 UNIT/ML injection Inject 50 Units into the skin See admin instructions. Use with insulin pump daily    . Insulin Human (INSULIN PUMP) SOLN Inject into the skin continuous. Novolog   Basal rate 0.29 units/ hour    . labetalol (NORMODYNE) 300 MG tablet TAKE 1 TABLET(300 MG) BY MOUTH TWICE DAILY (Patient taking differently: Take 300 mg by mouth daily. ) 180 tablet 1  . losartan (COZAAR) 50 MG tablet Take 50 mg by mouth every evening.     . rosuvastatin (CRESTOR) 10 MG tablet Take 10 mg by mouth at bedtime.       Objective: BP 119/62 (BP Location: Right Arm)   Pulse 77   Temp 98.6 F (37 C) (Oral)   Resp (!) 25   SpO2 100%   Exam:  Physical Exam Constitutional:      General: She is not in acute distress.    Appearance: She is well-developed. She is not ill-appearing.  HENT:     Head: Normocephalic.     Mouth/Throat:     Mouth: Mucous membranes are moist.  Eyes:     Extraocular Movements: Extraocular movements intact.     Pupils: Pupils are equal, round, and reactive to light.  Cardiovascular:     Rate and Rhythm: Normal rate and regular rhythm.     Pulses: Normal pulses.     Heart sounds: Murmur heard.      Comments: Systolic Murmur best heard at lower right sternal border Pulmonary:     Effort: Pulmonary effort is normal. No accessory muscle usage or respiratory distress.     Breath sounds: Examination of the right-upper field reveals decreased breath sounds. Examination of the right-middle field reveals decreased breath sounds. Examination of the right-lower field reveals decreased breath sounds. Decreased breath sounds present. No wheezing or rales.     Comments: Egophany heard on middle left lung side Chest:     Chest wall: No tenderness.  Abdominal:     General: There is distension.     Palpations: Abdomen is soft. There is no fluid wave.     Tenderness: There is no abdominal tenderness.  Musculoskeletal:     Right lower leg: Edema present.     Left lower leg: Edema present.     Comments: Mild non-pitting edema in both legs  Skin:    General: Skin is warm.     Capillary Refill: Capillary refill takes less than 2 seconds.  Neurological:     General: No focal deficit present.     Mental Status: She is alert.  Psychiatric:        Mood and Affect: Mood normal.        Behavior: Behavior normal.     Labs and Imaging: CBC BMET  Recent Labs  Lab 05/30/20 1352  WBC 5.9  HGB 10.7*  HCT 34.6*  PLT 245   Recent Labs  Lab 05/30/20 1352  NA 137  K 4.8  CL 92*  CO2 28  BUN 22*  CREATININE 5.39*  GLUCOSE 130*  CALCIUM 9.3     EKG: Normal Sinus rhthym  DG Chest 2 View  Result  Date: 05/30/2020 CLINICAL DATA:  Shortness of breath for several weeks, question recurrent pneumonia; past history diabetes mellitus, hypertension, end-stage renal disease, former smoker EXAM: CHEST - 2 VIEW COMPARISON:  02/10/2020 FINDINGS: Enlargement of cardiac silhouette with pulmonary vascular congestion. Stable mediastinal contours. Minimal interstitial edema. Large RIGHT pleural effusion with complete atelectasis of RIGHT middle and RIGHT lower lobes and partial atelectasis of RIGHT upper lobe. Subsegmental atelectasis LEFT base. No pneumothorax or acute osseous findings. Bones demineralized. IMPRESSION: Enlargement of cardiac silhouette with pulmonary vascular congestion and mild pulmonary edema. Large RIGHT pleural effusion with marked atelectasis of RIGHT lung. Minimal subsegmental atelectasis LEFT base. Electronically Signed   By: Lavonia Dana M.D.   On: 05/30/2020 14:29   US Abdomen Complete  Result Date: 05/30/2020 CLINICAL DATA:  Ascites.  Abdominal tenderness EXAM: ABDOMEN ULTRASOUND COMPLETE COMPARISON:  Ultrasound 10/20/2018 FINDINGS: Gallbladder: Gallbladder is nondistended. There is a rim calcification in the wall the gallbladder. There is gallbladder sludge layering dependently. Significant gallbladder wall thickening up to 6 8 mm. Negative sonographic Murphy's sign. Common bile duct: Diameter: Normal at 3 mm Liver: No focal lesion identified. Within normal limits in parenchymal echogenicity. Portal vein is patent on color Doppler imaging with normal direction of blood flow towards the liver. IVC: No abnormality visualized. Pancreas: Visualized portion unremarkable. Spleen: Size and appearance within normal limits. Right Kidney: Length: 7.7 cm. Echogenicity within normal limits. No mass or hydronephrosis visualized. Left Kidney: Length: 6.8 cm. Echogenicity within normal limits. No mass or hydronephrosis visualized. Abdominal aorta: No aneurysm visualized. Other findings: Moderate volume  RIGHT pleural effusion. Small volume ascites. IMPRESSION: 1. Abnormal gallbladder with significant gallbladder wall thickening, calcifications in the wall, and sludge within the gallbladder. Favor chronic findings. Consider chronic cholecystitis. Negative sonographic Murphy's sign. 2. Moderate RIGHT pleural effusion ascites. Electronically Signed   By: Suzy Bouchard M.D.   On: 05/30/2020 19:35    Delora Fuel, MD 05/30/2020, 3:46 PM PGY-1, [facility] Intern pager: [phone], text pages welcome  FPTS Upper-Level Resident Addendum I have independently interviewed and examined the patient. I have discussed the above with the original author and agree with their documentation. My edits for correction/addition/clarification are in - purple. Please see also any attending notes.  [place] Service pager: [phone] (text pages welcome through [facility])  Wilber Oliphant, M.D.  PGY-3 05/30/2020 7:42 PM

## 2020-05-31 ENCOUNTER — Inpatient Hospital Stay (HOSPITAL_COMMUNITY): Payer: Medicare HMO

## 2020-05-31 DIAGNOSIS — J90 Pleural effusion, not elsewhere classified: Secondary | ICD-10-CM | POA: Diagnosis not present

## 2020-05-31 DIAGNOSIS — R0689 Other abnormalities of breathing: Secondary | ICD-10-CM | POA: Diagnosis not present

## 2020-05-31 HISTORY — PX: IR THORACENTESIS ASP PLEURAL SPACE W/IMG GUIDE: IMG5380

## 2020-05-31 LAB — GLUCOSE, CAPILLARY
Glucose-Capillary: 150 mg/dL — ABNORMAL HIGH (ref 70–99)
Glucose-Capillary: 114 mg/dL — ABNORMAL HIGH (ref 70–99)
Glucose-Capillary: 170 mg/dL — ABNORMAL HIGH (ref 70–99)
Glucose-Capillary: 119 mg/dL — ABNORMAL HIGH (ref 70–99)
Glucose-Capillary: 174 mg/dL — ABNORMAL HIGH (ref 70–99)

## 2020-05-31 LAB — RENAL FUNCTION PANEL
Albumin: 2.9 g/dL — ABNORMAL LOW (ref 3.5–5.0)
Anion gap: 13 (ref 5–15)
BUN: 26 mg/dL — ABNORMAL HIGH (ref 6–20)
CO2: 30 mmol/L (ref 22–32)
Calcium: 9.4 mg/dL (ref 8.9–10.3)
Chloride: 95 mmol/L — ABNORMAL LOW (ref 98–111)
Creatinine, Ser: 6.42 mg/dL — ABNORMAL HIGH (ref 0.44–1.00)
GFR, Estimated: 8 mL/min — ABNORMAL LOW (ref >60)
Glucose, Bld: 156 mg/dL — ABNORMAL HIGH (ref 70–99)
Phosphorus: 5.9 mg/dL — ABNORMAL HIGH (ref 2.5–4.6)
Potassium: 4.3 mmol/L (ref 3.5–5.1)
Sodium: 138 mmol/L (ref 135–145)

## 2020-05-31 LAB — GRAM STAIN

## 2020-05-31 LAB — CBC
HCT: 32.4 % — ABNORMAL LOW (ref 36.0–46.0)
Hemoglobin: 10.2 g/dL — ABNORMAL LOW (ref 12.0–15.0)
MCH: 27.7 pg (ref 26.0–34.0)
MCHC: 31.5 g/dL (ref 30.0–36.0)
MCV: 88 fL (ref 80.0–100.0)
Platelets: 255 10*3/uL (ref 150–400)
RBC: 3.68 MIL/uL — ABNORMAL LOW (ref 3.87–5.11)
RDW: 15.9 % — ABNORMAL HIGH (ref 11.5–15.5)
WBC: 6.7 10*3/uL (ref 4.0–10.5)
nRBC: 0 % (ref 0.0–0.2)

## 2020-05-31 LAB — LACTATE DEHYDROGENASE, PLEURAL OR PERITONEAL FLUID: LD, Fluid: 169 U/L — ABNORMAL HIGH (ref 3–23)

## 2020-05-31 LAB — BODY FLUID CELL COUNT WITH DIFFERENTIAL
Eos, Fluid: 0 %
Lymphs, Fluid: 16 %
Monocyte-Macrophage-Serous Fluid: 33 % — ABNORMAL LOW (ref 50–90)
Neutrophil Count, Fluid: 49 % — ABNORMAL HIGH (ref 0–25)
Total Nucleated Cell Count, Fluid: 395 cu mm (ref 0–1000)

## 2020-05-31 LAB — HEPATITIS B SURFACE ANTIGEN: Hepatitis B Surface Ag: NONREACTIVE

## 2020-05-31 LAB — HEPATIC FUNCTION PANEL
ALT: 9 U/L (ref 0–44)
AST: 16 U/L (ref 15–41)
Albumin: 2.8 g/dL — ABNORMAL LOW (ref 3.5–5.0)
Alkaline Phosphatase: 131 U/L — ABNORMAL HIGH (ref 38–126)
Bilirubin, Direct: 0.4 mg/dL — ABNORMAL HIGH (ref 0.0–0.2)
Indirect Bilirubin: 0.9 mg/dL (ref 0.3–0.9)
Total Bilirubin: 1.3 mg/dL — ABNORMAL HIGH (ref 0.3–1.2)
Total Protein: 7.5 g/dL (ref 6.5–8.1)

## 2020-05-31 LAB — HEMOGLOBIN A1C
Hgb A1c MFr Bld: 7.5 % — ABNORMAL HIGH (ref 4.8–5.6)
Mean Plasma Glucose: 168.55 mg/dL

## 2020-05-31 MED ORDER — SODIUM CHLORIDE 0.9 % IV SOLN: 100.0000 mL | INTRAVENOUS | Status: DC | PRN

## 2020-05-31 MED ORDER — IOHEXOL 300 MG/ML  SOLN
75.0000 mL | Freq: Once | INTRAMUSCULAR | Status: AC | PRN
  Administered 2020-05-31: 75 mL via INTRAVENOUS

## 2020-05-31 MED ORDER — HEPARIN SODIUM (PORCINE) 1000 UNIT/ML DIALYSIS: 1000.0000 [IU] | INTRAMUSCULAR | Status: DC | PRN

## 2020-05-31 MED ORDER — LIDOCAINE HCL 1 % IJ SOLN
INTRAMUSCULAR | Status: AC
  Filled 2020-05-31: qty 20

## 2020-05-31 MED ORDER — LIDOCAINE HCL 1 % IJ SOLN
INTRAMUSCULAR | Status: DC | PRN
  Administered 2020-05-31: 10 mL

## 2020-05-31 MED ORDER — LIDOCAINE-PRILOCAINE 2.5-2.5 % EX CREA: 1.0000 "application " | TOPICAL_CREAM | CUTANEOUS | Status: DC | PRN

## 2020-05-31 MED ORDER — DOXERCALCIFEROL 4 MCG/2ML IV SOLN
INTRAVENOUS | Status: AC
  Filled 2020-05-31: qty 2

## 2020-05-31 MED ORDER — LIDOCAINE HCL (PF) 1 % IJ SOLN: 5.0000 mL | INTRAMUSCULAR | Status: DC | PRN

## 2020-05-31 MED ORDER — PENTAFLUOROPROP-TETRAFLUOROETH EX AERO: 1.0000 "application " | INHALATION_SPRAY | CUTANEOUS | Status: DC | PRN

## 2020-05-31 NOTE — Progress Notes (Addendum)
The patient endorsed feeling well today with no respiratory distress. Granted, she had not moved around a lot today.  Acute hypoxic respiratory failure: secondary to large right pleural effusion. IR consulted for thoracentesis. Monitor O2 requirements closely and titrate O2 as needed.  Abdominal distension: Concern for abdominal ascites Abdominal U/S negative for ascites but showed chronic cholecystitis As discussed with the patient, since she is not endorsing abdominal pain, she can f/u with surgery as an outpatient for elective laparoscopic cholecystectomy. She agreed with the plan.  ESRD on HD: management per renal.  Her other chronic problems are stable.

## 2020-05-31 NOTE — Procedures (Addendum)
PROCEDURE SUMMARY:  Successful image-guided right thoracentesis. Yielded 900 milliliters of hazy amber fluid. Procedure was stopped after 900 milliliters secondary to patient's symptoms (coughing, chest pain). Patient tolerated procedure well. No immediate complications. EBL < 2 mL.  Specimen was sent for labs. CXR ordered.  Please see imaging section of Epic for full dictation.   Claris Pong Kylen Schliep PA-C 05/31/2020 11:55 AM

## 2020-05-31 NOTE — Plan of Care (Signed)
Pt understanding of plan

## 2020-05-31 NOTE — Consult Note (Signed)
[facility]  INPATIENT CONSULTATION  Reason for Consultation: ESRD and assoc condition comanagement Requesting Provider: Dr. Gwendlyn Deutscher  HPI: [name] is an 44 y.o. female with DM type 1, ESRD on HD TTS AF, GERD, HL, HTN, arthritis, anemia who is admitted with R pleural effusion and seen for ESRD dialysis and assoc conditions.   Presented to ED yesterday with 1 wk h/o dyspnea - O2 sat 87% ra.  X ray with sizeable R pleural effusion - admitted to have dx/therapeutic thora -- still pending.  Afebrile, no leukocytosis.    Seen on HD this AM - no new issues.  Tol treatment fine.    PMH: Past Medical History:  Diagnosis Date  . Anemia   . Arthritis   . Ascites 03/28/2020  . Diabetes mellitus   . Diabetic retinopathy ([place])    Hx of laser Rx's  . DM type 1 (diabetes mellitus, type 1) ([place]) 09/12/2014   Age of onset for DM type 1 was age 70.     . Enlarged thyroid gland   . ESRD on hemodialysis ([place])    ESRD due to DM type I, age of onset DM 1 was age 34.  Went on dialysis in March 2012.  Gets HD now at [facility] on a TTS schedule.    Marland Kitchen ESRD on hemodialysis ([place]) 09/12/2014   ESRD due to DM type 1.  Started HD in 2012 at [facility].  Gets HD there on a TTS schedule.    Marland Kitchen GERD (gastroesophageal reflux disease)   . History of blood transfusion   . Hyperlipidemia   . Hypertension   . Peripheral vascular disease ([place])   . Pneumonia    PSH: Past Surgical History:  Procedure Laterality Date  . ARTERIOVENOUS GRAFT PLACEMENT    . ARTERY REPAIR Left 12/10/2012   Procedure: BRACHIAL ARTERY REPAIR;  Surgeon: Angelia Mould, MD;  Location: [facility] OR;  Service: Vascular;  Laterality: Left;  Exploration Left Brachial Artery for AVF  . AV FISTULA PLACEMENT Right 01/11/2018   Procedure: INSERTION OF ARTERIOVENOUS (AV) GORE-TEX GRAFT  UPPER ARM;  Surgeon: Angelia Mould, MD;  Location: [place];  Service: Vascular;  Laterality: Right;  . BASCILIC VEIN TRANSPOSITION Right  09/28/2017   Procedure: FIRST STAGE BASILIC VEIN TRANSPOSITION;  Surgeon: Angelia Mould, MD;  Location: [place];  Service: Vascular;  Laterality: Right;  . CESAREAN SECTION    . EYE SURGERY     [place]  . EYE SURGERY Left   . REVISION OF ARTERIOVENOUS GORETEX GRAFT Left 08/01/2016   Procedure: REVISION OF ARTERIOVENOUS GORETEX GRAFT;  Surgeon: Angelia Mould, MD;  Location: [place];  Service: Vascular;  Laterality: Left;  . REVISION OF ARTERIOVENOUS GORETEX GRAFT Right 10/21/2019   Procedure: REVISION OF ARTERIOVENOUS GORETEX GRAFT ARM;  Surgeon: Rosetta Posner, MD;  Location: [place];  Service: Vascular;  Laterality: Right;  . SHUNTOGRAM Left 11/30/2013   Procedure: SHUNTOGRAM;  Surgeon: Serafina Mitchell, MD;  Location: [facility] CATH LAB;  Service: Cardiovascular;  Laterality: Left;  . TEE WITHOUT CARDIOVERSION N/A 02/13/2020   Procedure: TRANSESOPHAGEAL ECHOCARDIOGRAM (TEE);  Surgeon: Josue Hector, MD;  Location: [facility] ENDOSCOPY;  Service: Cardiovascular;  Laterality: N/A;     Past Medical History:  Diagnosis Date  . Anemia   . Arthritis   . Ascites 03/28/2020  . Diabetes mellitus   . Diabetic retinopathy ([place])    Hx of laser Rx's  . DM type 1 (diabetes mellitus, type  1) ([place]) 09/12/2014   Age of onset for DM type 1 was age 64.     . Enlarged thyroid gland   . ESRD on hemodialysis ([place])    ESRD due to DM type I, age of onset DM 1 was age 55.  Went on dialysis in March 2012.  Gets HD now at [facility] on a TTS schedule.    Marland Kitchen ESRD on hemodialysis ([place]) 09/12/2014   ESRD due to DM type 1.  Started HD in 2012 at [facility].  Gets HD there on a TTS schedule.    Marland Kitchen GERD (gastroesophageal reflux disease)   . History of blood transfusion   . Hyperlipidemia   . Hypertension   . Peripheral vascular disease ([place])   . Pneumonia     Medications:  I have reviewed the patient's current medications.  Medications Prior to Admission  Medication Sig Dispense Refill  . amLODipine (NORVASC) 10 MG  tablet Take 10 mg by mouth at bedtime.     . calcium acetate (PHOSLO) 667 MG capsule Take 667 mg by mouth 3 (three) times daily with meals. May take an additional 667 mg dose with snacks    . GLUCAGON EMERGENCY 1 MG injection Inject 1 mg into the muscle once as needed (for severe low blood sugar).     . insulin aspart (NOVOLOG) 100 UNIT/ML injection Inject 50 Units into the skin See admin instructions. Use with insulin pump daily    . Insulin Human (INSULIN PUMP) SOLN Inject into the skin continuous. Novolog   Basal rate 0.29 units/ hour    . labetalol (NORMODYNE) 300 MG tablet TAKE 1 TABLET(300 MG) BY MOUTH TWICE DAILY (Patient taking differently: Take 300 mg by mouth daily. ) 180 tablet 1  . losartan (COZAAR) 50 MG tablet Take 50 mg by mouth every evening.     . rosuvastatin (CRESTOR) 10 MG tablet Take 10 mg by mouth at bedtime.       ALLERGIES:   Allergies  Allergen Reactions  . Pollen Extract Other (See Comments)    Watery eyes    FAM HX: Family History  Problem Relation Age of Onset  . Cancer Mother        breast  . Hypertension Father     Social History:   reports that she has never smoked. She has never used smokeless tobacco. She reports that she does not drink alcohol and does not use drugs.  ROS: 12 system ROS per HPI above  Blood pressure 113/60, pulse 74, temperature 98 F (36.7 C), temperature source Oral, resp. rate 20, height 5\' 3"  (1.6 m), weight 54.2 kg, SpO2 96 %. PHYSICAL EXAM: Gen: thin, appears comfortable  Eyes: anicteric ENT: MMM Neck: supple CV:  RRR, II/VI SEM Abd:  Soft, nontender Lungs: normal WOB, dec BS R to mid fields, L clear Extr:  No edema, RUE AVF Qb 425 Neuro; nonfocal  Skin: no rashes or lesions   Results for orders placed or performed during the hospital encounter of 05/30/20 (from the past 48 hour(s))  CBC with Differential     Status: Abnormal   Collection Time: 05/30/20  1:52 PM  Result Value Ref Range   WBC 5.9 4.0 - 10.5 K/uL    RBC 3.79 (L) 3.87 - 5.11 MIL/uL   Hemoglobin 10.7 (L) 12.0 - 15.0 g/dL   HCT 34.6 (L) 36 - 46 %   MCV 91.3 80.0 - 100.0 fL   MCH 28.2 26.0 - 34.0 pg  MCHC 30.9 30.0 - 36.0 g/dL   RDW 16.0 (H) 11.5 - 15.5 %   Platelets 245 150 - 400 K/uL   nRBC 0.0 0.0 - 0.2 %   Neutrophils Relative % 63 %   Neutro Abs 3.7 1.7 - 7.7 K/uL   Lymphocytes Relative 19 %   Lymphs Abs 1.1 0.7 - 4.0 K/uL   Monocytes Relative 11 %   Monocytes Absolute 0.6 0.1 - 1.0 K/uL   Eosinophils Relative 6 %   Eosinophils Absolute 0.4 0.0 - 0.5 K/uL   Basophils Relative 1 %   Basophils Absolute 0.1 0.0 - 0.1 K/uL   Immature Granulocytes 0 %   Abs Immature Granulocytes 0.02 0.00 - 0.07 K/uL    Comment: Performed at [facility] [address], [place], [place] [address]  Comprehensive metabolic panel     Status: Abnormal   Collection Time: 05/30/20  1:52 PM  Result Value Ref Range   Sodium 137 135 - 145 mmol/L   Potassium 4.8 3.5 - 5.1 mmol/L   Chloride 92 (L) 98 - 111 mmol/L   CO2 28 22 - 32 mmol/L   Glucose, Bld 130 (H) 70 - 99 mg/dL    Comment: Glucose reference range applies only to samples taken after fasting for at least 8 hours.   BUN 22 (H) 6 - 20 mg/dL   Creatinine, Ser 5.39 (H) 0.44 - 1.00 mg/dL   Calcium 9.3 8.9 - 10.3 mg/dL   Total Protein 7.7 6.5 - 8.1 g/dL   Albumin 3.0 (L) 3.5 - 5.0 g/dL   AST 23 15 - 41 U/L   ALT 9 0 - 44 U/L   Alkaline Phosphatase 127 (H) 38 - 126 U/L   Total Bilirubin 1.7 (H) 0.3 - 1.2 mg/dL   GFR, Estimated 10 (L) >60 mL/min    Comment: (NOTE) Calculated using the CKD-EPI Creatinine Equation (2021)    Anion gap 17 (H) 5 - 15    Comment: Performed at [facility], [place] [address] [place], [place] [address]  I-Stat Beta hCG blood, ED ([facility], [facility], [facility] only)     Status: None   Collection Time: 05/30/20  2:16 PM  Result Value Ref Range   I-stat hCG, quantitative <5.0 <5 mIU/mL   Comment 3            Comment:   GEST. AGE      CONC.  (mIU/mL)   <=1 WEEK        5 -  50     2 WEEKS       50 - 500     3 WEEKS       100 - 10,000     4 WEEKS     1,000 - 30,000        FEMALE AND NON-PREGNANT FEMALE:     LESS THAN 5 mIU/mL   Respiratory Panel by RT PCR (Flu A&B, Covid) - Nasopharyngeal Swab     Status: None   Collection Time: 05/30/20  3:02 PM   Specimen: Nasopharyngeal Swab  Result Value Ref Range   SARS Coronavirus 2 by RT PCR NEGATIVE NEGATIVE    Comment: (NOTE) SARS-CoV-2 target nucleic acids are NOT DETECTED.  The SARS-CoV-2 RNA is generally detectable in upper respiratoy specimens during the acute phase of infection. The lowest concentration of SARS-CoV-2 viral copies this assay can detect is 131 copies/mL. A negative result does not preclude SARS-Cov-2 infection and should not be used as the  sole basis for treatment or other patient management decisions. A negative result may occur with  improper specimen collection/handling, submission of specimen other than nasopharyngeal swab, presence of viral mutation(s) within the areas targeted by this assay, and inadequate number of viral copies (<131 copies/mL). A negative result must be combined with clinical observations, patient history, and epidemiological information. The expected result is Negative.  Fact Sheet for Patients:  PinkCheek.be  Fact Sheet for Healthcare Providers:  GravelBags.it  This test is no t yet approved or cleared by the [place] FDA and  has been authorized for detection and/or diagnosis of SARS-CoV-2 by FDA under an Emergency Use Authorization (EUA). This EUA will remain  in effect (meaning this test can be used) for the duration of the COVID-19 declaration under Section 564(b)(1) of the Act, 21 U.S.C. section 360bbb-3(b)(1), unless the authorization is terminated or revoked sooner.     Influenza A by PCR NEGATIVE NEGATIVE   Influenza B by PCR NEGATIVE NEGATIVE    Comment: (NOTE) The Xpert Xpress  SARS-CoV-2/FLU/RSV assay is intended as an aid in  the diagnosis of influenza from Nasopharyngeal swab specimens and  should not be used as a sole basis for treatment. Nasal washings and  aspirates are unacceptable for Xpert Xpress SARS-CoV-2/FLU/RSV  testing.  Fact Sheet for Patients: PinkCheek.be  Fact Sheet for Healthcare Providers: GravelBags.it  This test is not yet approved or cleared by the [place] FDA and  has been authorized for detection and/or diagnosis of SARS-CoV-2 by  FDA under an Emergency Use Authorization (EUA). This EUA will remain  in effect (meaning this test can be used) for the duration of the  Covid-19 declaration under Section 564(b)(1) of the Act, 21  U.S.C. section 360bbb-3(b)(1), unless the authorization is  terminated or revoked. Performed at [facility], [place] [address] [place], [place] [address]   CBG monitoring, ED     Status: None   Collection Time: 05/30/20  5:13 PM  Result Value Ref Range   Glucose-Capillary 88 70 - 99 mg/dL    Comment: Glucose reference range applies only to samples taken after fasting for at least 8 hours.  Lactate dehydrogenase     Status: None   Collection Time: 05/30/20  9:08 PM  Result Value Ref Range   LDH 121 98 - 192 U/L    Comment: Performed at [facility], [place] [address] [place], [place] [address]  Glucose, capillary     Status: None   Collection Time: 05/30/20 10:12 PM  Result Value Ref Range   Glucose-Capillary 86 70 - 99 mg/dL    Comment: Glucose reference range applies only to samples taken after fasting for at least 8 hours.  CBC     Status: Abnormal   Collection Time: 05/31/20  2:48 AM  Result Value Ref Range   WBC 6.7 4.0 - 10.5 K/uL   RBC 3.68 (L) 3.87 - 5.11 MIL/uL   Hemoglobin 10.2 (L) 12.0 - 15.0 g/dL   HCT 32.4 (L) 36 - 46 %   MCV 88.0 80.0 - 100.0 fL   MCH 27.7 26.0 - 34.0 pg   MCHC 31.5 30.0 - 36.0 g/dL   RDW 15.9 (H)  11.5 - 15.5 %   Platelets 255 150 - 400 K/uL   nRBC 0.0 0.0 - 0.2 %    Comment: Performed at [facility] [address] [place], [place] [address]  Renal function panel     Status: Abnormal  Collection Time: 05/31/20  2:48 AM  Result Value Ref Range   Sodium 138 135 - 145 mmol/L   Potassium 4.3 3.5 - 5.1 mmol/L   Chloride 95 (L) 98 - 111 mmol/L   CO2 30 22 - 32 mmol/L   Glucose, Bld 156 (H) 70 - 99 mg/dL    Comment: Glucose reference range applies only to samples taken after fasting for at least 8 hours.   BUN 26 (H) 6 - 20 mg/dL   Creatinine, Ser 6.42 (H) 0.44 - 1.00 mg/dL   Calcium 9.4 8.9 - 10.3 mg/dL   Phosphorus 5.9 (H) 2.5 - 4.6 mg/dL   Albumin 2.9 (L) 3.5 - 5.0 g/dL   GFR, Estimated 8 (L) >60 mL/min    Comment: (NOTE) Calculated using the CKD-EPI Creatinine Equation (2021)    Anion gap 13 5 - 15    Comment: Performed at [facility] [address], [place], [place] [address]  Hepatic function panel     Status: Abnormal   Collection Time: 05/31/20  2:48 AM  Result Value Ref Range   Total Protein 7.5 6.5 - 8.1 g/dL   Albumin 2.8 (L) 3.5 - 5.0 g/dL   AST 16 15 - 41 U/L   ALT 9 0 - 44 U/L   Alkaline Phosphatase 131 (H) 38 - 126 U/L   Total Bilirubin 1.3 (H) 0.3 - 1.2 mg/dL   Bilirubin, Direct 0.4 (H) 0.0 - 0.2 mg/dL   Indirect Bilirubin 0.9 0.3 - 0.9 mg/dL    Comment: Performed at [facility] [address] [place], [place] [address]  Hemoglobin A1c     Status: Abnormal   Collection Time: 05/31/20  2:48 AM  Result Value Ref Range   Hgb A1c MFr Bld 7.5 (H) 4.8 - 5.6 %    Comment: (NOTE) Pre diabetes:          5.7%-6.4%  Diabetes:              >6.4%  Glycemic control for   <7.0% adults with diabetes    Mean Plasma Glucose 168.55 mg/dL    Comment: Performed at [facility] [address], [place], [place] [address]  Glucose, capillary     Status: Abnormal   Collection Time: 05/31/20  3:25 AM  Result Value Ref Range    Glucose-Capillary 150 (H) 70 - 99 mg/dL    Comment: Glucose reference range applies only to samples taken after fasting for at least 8 hours.  Glucose, capillary     Status: Abnormal   Collection Time: 05/31/20  6:24 AM  Result Value Ref Range   Glucose-Capillary 114 (H) 70 - 99 mg/dL    Comment: Glucose reference range applies only to samples taken after fasting for at least 8 hours.    DG Chest 2 View  Result Date: 05/30/2020 CLINICAL DATA:  Shortness of breath for several weeks, question recurrent pneumonia; past history diabetes mellitus, hypertension, end-stage renal disease, former smoker EXAM: CHEST - 2 VIEW COMPARISON:  02/10/2020 FINDINGS: Enlargement of cardiac silhouette with pulmonary vascular congestion. Stable mediastinal contours. Minimal interstitial edema. Large RIGHT pleural effusion with complete atelectasis of RIGHT middle and RIGHT lower lobes and partial atelectasis of RIGHT upper lobe. Subsegmental atelectasis LEFT base. No pneumothorax or acute osseous findings. Bones demineralized. IMPRESSION: Enlargement of cardiac silhouette with pulmonary vascular congestion and mild pulmonary edema. Large RIGHT pleural effusion with marked atelectasis of RIGHT lung. Minimal subsegmental atelectasis LEFT base. Electronically Signed  By: Lavonia Dana M.D.   On: 05/30/2020 14:29   US Abdomen Complete  Result Date: 05/30/2020 CLINICAL DATA:  Ascites.  Abdominal tenderness EXAM: ABDOMEN ULTRASOUND COMPLETE COMPARISON:  Ultrasound 10/20/2018 FINDINGS: Gallbladder: Gallbladder is nondistended. There is a rim calcification in the wall the gallbladder. There is gallbladder sludge layering dependently. Significant gallbladder wall thickening up to 6 8 mm. Negative sonographic Murphy's sign. Common bile duct: Diameter: Normal at 3 mm Liver: No focal lesion identified. Within normal limits in parenchymal echogenicity. Portal vein is patent on color Doppler imaging with normal direction of blood flow  towards the liver. IVC: No abnormality visualized. Pancreas: Visualized portion unremarkable. Spleen: Size and appearance within normal limits. Right Kidney: Length: 7.7 cm. Echogenicity within normal limits. No mass or hydronephrosis visualized. Left Kidney: Length: 6.8 cm. Echogenicity within normal limits. No mass or hydronephrosis visualized. Abdominal aorta: No aneurysm visualized. Other findings: Moderate volume RIGHT pleural effusion. Small volume ascites. IMPRESSION: 1. Abnormal gallbladder with significant gallbladder wall thickening, calcifications in the wall, and sludge within the gallbladder. Favor chronic findings. Consider chronic cholecystitis. Negative sonographic Murphy's sign. 2. Moderate RIGHT pleural effusion ascites. Electronically Signed   By: Suzy Bouchard M.D.   On: 05/30/2020 19:35   HD orders:  [facility] TTS 425 / 800, 2K 2.25 Ca, EDW 53, 3:30, AVF last tx 11/9 - post wt 52.7, post BP 152/75 runs full tx venofer 50 weekly 11/4 Hb 11.9, no ESA; Phos 7.1, Ca 9.6, PTH 04/2020 445 hectorol 2 TIW, sensipar 30 TIW  Assessment/Plan **R pleural effusion:  For thora with IR today - dx and therapeutic.    **ESRD on HD:  On HD currently - tol treatment well.  Per TTS schedule while admitted  **Anemia:  Hb 10.2, monitor  **BMM:  Cont outpt hectorol and sensipar.  PHos 5.9 - renal diet and binders.  **DM type 1:  Insulin per primary  **HTN:  Normotensive, on home meds, hold prior to HD.  Justin Mend 05/31/2020, 8:43 AM

## 2020-05-31 NOTE — Evaluation (Signed)
Occupational Therapy Evaluation Patient Details Name: [name] MRN: [id number] DOB: 1975-12-11 Today's Date: 05/31/2020    History of Present Illness Pt is a 44 year old woman admitted with difficulty breathing on 11/10. Underwent thoracentesis on 05/31/20. PMH: type 1 diabetes, ESRD, HTN, ascites, severe tricuspid regurgitation.   Clinical Impression   Pt is functioning independently in mobility and ADL. Sp02 85% on RA, 90% on 1L. Will follow to educate in energy conservation and breathing techniques and determine need for tub equipment.     Follow Up Recommendations  No OT follow up    Equipment Recommendations   (determine need for tub seat)    Recommendations for Other Services       Precautions / Restrictions Precautions Precautions: Other (comment) Precaution Comments: watch 02      Mobility Bed Mobility Overal bed mobility: Independent                  Transfers Overall transfer level: Independent Equipment used: None                  Balance Overall balance assessment: No apparent balance deficits (not formally assessed)                                         ADL either performed or assessed with clinical judgement   ADL Overall ADL's : Modified independent                                             Vision Baseline Vision/History: No visual deficits       Perception     Praxis      Pertinent Vitals/Pain Pain Assessment: No/denies pain     Hand Dominance Right   Extremity/Trunk Assessment Upper Extremity Assessment Upper Extremity Assessment: Overall WFL for tasks assessed   Lower Extremity Assessment Lower Extremity Assessment: Overall WFL for tasks assessed   Cervical / Trunk Assessment Cervical / Trunk Assessment: Normal   Communication Communication Communication: No difficulties   Cognition Arousal/Alertness: Awake/alert Behavior During Therapy: WFL for tasks  assessed/performed Overall Cognitive Status: Within Functional Limits for tasks assessed                                     General Comments       Exercises     Shoulder Instructions      Home Living Family/patient expects to be discharged to:: Private residence Living Arrangements: Children (1 year old daughter) Available Help at Discharge: Family;Available PRN/intermittently Type of Home: Other(Comment) (townhome) Home Access: Level entry     Home Layout: Two level;Other (Comment) (laundry on second floor) Alternate Level Stairs-Number of Steps: flight   Bathroom Shower/Tub: Teacher, early years/pre: Standard     Home Equipment: None          Prior Functioning/Environment Level of Independence: Independent                 OT Problem List: Decreased activity tolerance      OT Treatment/Interventions: Energy conservation    OT Goals(Current goals can be found in the care plan section) Acute Rehab OT Goals Patient Stated Goal: find out why she  is short of breath OT Goal Formulation: With patient Time For Goal Achievement: 06/14/20 Potential to Achieve Goals: Good ADL Goals Additional ADL Goal #1: Pt will generalize energy conservation and pursed lip breathing techniques in ADL and mobility independently. Additional ADL Goal #2: Pt will be knowledgeable in benefits of tub seat.  OT Frequency: Min 1X/week   Barriers to D/C:            Co-evaluation              AM-PAC OT "6 Clicks" Daily Activity     Outcome Measure Help from another person eating meals?: None Help from another person taking care of personal grooming?: None Help from another person toileting, which includes using toliet, bedpan, or urinal?: None Help from another person bathing (including washing, rinsing, drying)?: None Help from another person to put on and taking off regular upper body clothing?: None Help from another person to put on and taking  off regular lower body clothing?: None 6 Click Score: 24   End of Session Equipment Utilized During Treatment: Oxygen (1L)  Activity Tolerance: Patient tolerated treatment well Patient left: in chair;with call bell/phone within reach  OT Visit Diagnosis: Other (comment) (decreased activity tolerance)                Time: 0037-9444 OT Time Calculation (min): 18 min Charges:  OT General Charges $OT Visit: 1 Visit OT Evaluation $OT Eval Low Complexity: 1 Low  Nestor Lewandowsky, OTR/L Acute Rehabilitation Services Pager: [phone] Office: [phone]  [name] 05/31/2020, 2:01 PM

## 2020-05-31 NOTE — Progress Notes (Signed)
Family Medicine Teaching Service Daily Progress Note Intern Pager: [phone]  Patient name: [name] Medical record number: [id number] Date of birth: 1976-05-10 Age: 44 y.o. Gender: female  Primary Care Provider: Trey Sailors, PA Consultants: Nephro, Cards Code Status: Full  Pt Overview and Major Events to Date:  Admitted 11/10  Assessment and Plan: [name] is a 44 y.o. female presenting with . PMH is significant for Type 1 DM, Hypertension, Hyperlipidemia, Ascites, ESRD on Dialysis TThS, and severe tricuspid regurgitation.  Dyspnea / Pleural Effusion Thorocentesis ordereed for today. Satting >95 on 2L LFNC O2.  Patient indicates she is currently breathing comfortably.  Good air movement heard on left side.   - Continuous Pulse Oximetry, O2 PRN to keep sats >92%, Vital Signs q4hr  - IR consulted, for thoracentesis - Follow up pleural fluid labs/cytology  Abdominal Distension Obtained Abdominal Ultrasound.  Showed no significant Ascites or evidence of Cirrhosis.  Did identify Chronic Cholecystitis.  ALT AST normal.  Alk Phos- 131.  No abdominal tenderness to deep palapation. - Will hold off on consulting GI   Tricuspid Regurgitation Seen on TEE in 02/13/20.  Has appt with Cardiology in January. - Consult Cardiology  Hypertension BP increased to 140's overnight.  At home on Amlodipine 10 mg qd, Losartan 50 mg and Labetalol 300 mg.   - Restart home meds  Type 1 Diabetes Mellitis Has Insulin pump.  A1C today is 7.5 - Continue Insulin pump, pharmacy consulted - CBG's with meals   Hyperlipidemia Patient on rosuvastatin 10 mg at home -Continue home rosuvastatin  Anemia, normocytic Hemoglobin 10.7, at baseline.  -AM CBC  Elevated ALP  Patient does not have any TTP or abominable pain. Can be elevated in the setting of patient's ESRD.  AST and ALT within normal limits. We will follow-up abdominal ultrasound to evaluate for cholestatic disease. - AM  LFT - f/u US    FEN/GI: Carb controlled/renal Prophylaxis: Heparin   Status is: Inpatient  Remains inpatient appropriate because:Ongoing diagnostic testing needed not appropriate for outpatient work up   Dispo: The patient is from: Home              Anticipated d/c is to: Home              Anticipated d/c date is: 1 day              Patient currently is not medically stable to d/c.   Subjective:  Patient currently resting comfortably in bed.  Indicates breathing fine on 2L O2.    Objective: Temp:  [97.9 F (36.6 C)-98.7 F (37.1 C)] 98 F (36.7 C) (11/11 0650) Pulse Rate:  [74-86] 74 (11/11 0619) Resp:  [13-29] 22 (11/11 0930) BP: (105-140)/(57-99) 116/58 (11/11 0930) SpO2:  [87 %-100 %] 96 % (11/11 0650) Weight:  [53.7 kg-54.2 kg] 54.2 kg (11/11 0650) Physical Exam:  Physical Exam Constitutional:      Appearance: She is well-developed.  HENT:     Head: Normocephalic and atraumatic.  Cardiovascular:     Rate and Rhythm: Normal rate and regular rhythm.  Pulmonary:     Effort: Pulmonary effort is normal.     Breath sounds: Examination of the right-upper field reveals decreased breath sounds. Examination of the right-middle field reveals decreased breath sounds. Examination of the right-lower field reveals decreased breath sounds. Decreased breath sounds present. No wheezing, rhonchi or rales.  Abdominal:     General: There is distension.     Palpations: Abdomen is  soft. There is no fluid wave.     Tenderness: There is no abdominal tenderness.  Neurological:     Mental Status: She is alert.     Laboratory: Recent Labs  Lab 05/30/20 1352 05/31/20 0248  WBC 5.9 6.7  HGB 10.7* 10.2*  HCT 34.6* 32.4*  PLT 245 255   Recent Labs  Lab 05/30/20 1352 05/31/20 0248  NA 137 138  K 4.8 4.3  CL 92* 95*  CO2 28 30  BUN 22* 26*  CREATININE 5.39* 6.42*  CALCIUM 9.3 9.4  PROT 7.7 7.5  BILITOT 1.7* 1.3*  ALKPHOS 127* 131*  ALT 9 9  AST 23 16  GLUCOSE 130*  156*   A1C-7.5 Hep B Antigen- Non-reactive LDH (blood)- 121   Imaging/Diagnostic Tests:  EXAM: ULTRASOUND GUIDED DIAGNOSTIC AND THERAPEUTIC RIGHT THORACENTESIS  MEDICATIONS: 10 mL 1% lidocaine  COMPLICATIONS: None immediate.  PROCEDURE: An ultrasound guided thoracentesis was thoroughly discussed with the patient and questions answered. The benefits, risks, alternatives and complications were also discussed. The patient understands and wishes to proceed with the procedure. Written consent was obtained.  Ultrasound was performed to localize and mark an adequate pocket of fluid in the right chest. The area was then prepped and draped in the normal sterile fashion. 1% Lidocaine was used for local anesthesia. Under ultrasound guidance a 6 Fr Safe-T-Centesis catheter was introduced. Thoracentesis was performed. The catheter was removed and a dressing applied.  FINDINGS: A total of approximately 900 mL of hazy amber fluid was removed. Procedure was stopped after 900 mL secondary patient's symptoms (coughing, chest pain). Samples were sent to the laboratory as requested by the clinical team.  IMPRESSION: Successful ultrasound guided right thoracentesis yielding 900 mL of pleural fluid.  Read by: Earley Abide, PA-C   Electronically Signed   By: Ruthann Cancer MD   On: 05/31/2020 12:15  Delora Fuel, MD 05/31/2020, 9:41 AM PGY-1, [place] Intern pager: [phone], text pages welcome

## 2020-05-31 NOTE — Progress Notes (Signed)
OT Cancellation Note  Patient Details Name: [name] MRN: [id number] DOB: 1976-01-14   Cancelled Treatment:    Reason Eval/Treat Not Completed: Patient at procedure or test/ unavailable (Currently in HD) Will follow.  [name] 05/31/2020, 8:28 AM  Nestor Lewandowsky, OTR/L Acute Rehabilitation Services Pager: [phone] Office: [phone]

## 2020-05-31 NOTE — Procedures (Signed)
I was present at this dialysis session, have reviewed the session itself and made  appropriate changes  Qb 425 UFG 2L  Tolerating treatment fine so far.  No anticipated issues.    Jannifer Hick MD [facility] pager [phone]   05/31/2020, 8:48 AM

## 2020-06-01 ENCOUNTER — Inpatient Hospital Stay (HOSPITAL_COMMUNITY): Payer: Medicare HMO

## 2020-06-01 ENCOUNTER — Encounter (HOSPITAL_COMMUNITY)
Admission: EM | Disposition: A | Discharge status: Home / Self Care | Payer: Self-pay | Source: Home / Self Care | Attending: Family Medicine

## 2020-06-01 ENCOUNTER — Encounter (HOSPITAL_COMMUNITY): Payer: Self-pay | Admitting: Family Medicine

## 2020-06-01 DIAGNOSIS — J90 Pleural effusion, not elsewhere classified: Secondary | ICD-10-CM

## 2020-06-01 DIAGNOSIS — N186 End stage renal disease: Secondary | ICD-10-CM | POA: Diagnosis not present

## 2020-06-01 DIAGNOSIS — R0902 Hypoxemia: Secondary | ICD-10-CM | POA: Diagnosis not present

## 2020-06-01 HISTORY — PX: CHEST TUBE INSERTION: SHX231

## 2020-06-01 LAB — BODY FLUID CELL COUNT WITH DIFFERENTIAL
Eos, Fluid: 3 %
Lymphs, Fluid: 8 %
Monocyte-Macrophage-Serous Fluid: 7 % — ABNORMAL LOW (ref 50–90)
Neutrophil Count, Fluid: 82 % — ABNORMAL HIGH (ref 0–25)
Total Nucleated Cell Count, Fluid: 720 cu mm (ref 0–1000)

## 2020-06-01 LAB — COMPREHENSIVE METABOLIC PANEL
ALT: 8 U/L (ref 0–44)
AST: 16 U/L (ref 15–41)
Albumin: 2.8 g/dL — ABNORMAL LOW (ref 3.5–5.0)
Alkaline Phosphatase: 125 U/L (ref 38–126)
Anion gap: 13 (ref 5–15)
BUN: 27 mg/dL — ABNORMAL HIGH (ref 6–20)
CO2: 28 mmol/L (ref 22–32)
Calcium: 9.2 mg/dL (ref 8.9–10.3)
Chloride: 93 mmol/L — ABNORMAL LOW (ref 98–111)
Creatinine, Ser: 5.18 mg/dL — ABNORMAL HIGH (ref 0.44–1.00)
GFR, Estimated: 10 mL/min — ABNORMAL LOW (ref >60)
Glucose, Bld: 112 mg/dL — ABNORMAL HIGH (ref 70–99)
Potassium: 4.4 mmol/L (ref 3.5–5.1)
Sodium: 134 mmol/L — ABNORMAL LOW (ref 135–145)
Total Bilirubin: 1.2 mg/dL (ref 0.3–1.2)
Total Protein: 7.3 g/dL (ref 6.5–8.1)

## 2020-06-01 LAB — AMYLASE, PLEURAL OR PERITONEAL FLUID: Amylase, Fluid: 33 U/L

## 2020-06-01 LAB — PROTEIN, PLEURAL OR PERITONEAL FLUID: Total protein, fluid: 4.5 g/dL

## 2020-06-01 LAB — RENAL FUNCTION PANEL
Albumin: 2.7 g/dL — ABNORMAL LOW (ref 3.5–5.0)
Anion gap: 12 (ref 5–15)
BUN: 20 mg/dL (ref 6–20)
CO2: 30 mmol/L (ref 22–32)
Calcium: 9.2 mg/dL (ref 8.9–10.3)
Chloride: 94 mmol/L — ABNORMAL LOW (ref 98–111)
Creatinine, Ser: 4.27 mg/dL — ABNORMAL HIGH (ref 0.44–1.00)
GFR, Estimated: 13 mL/min — ABNORMAL LOW (ref >60)
Glucose, Bld: 127 mg/dL — ABNORMAL HIGH (ref 70–99)
Phosphorus: 4.4 mg/dL (ref 2.5–4.6)
Potassium: 4.3 mmol/L (ref 3.5–5.1)
Sodium: 136 mmol/L (ref 135–145)

## 2020-06-01 LAB — GLUCOSE, PLEURAL OR PERITONEAL FLUID: Glucose, Fluid: 93 mg/dL

## 2020-06-01 LAB — CBC
HCT: 34.5 % — ABNORMAL LOW (ref 36.0–46.0)
Hemoglobin: 11 g/dL — ABNORMAL LOW (ref 12.0–15.0)
MCH: 28.5 pg (ref 26.0–34.0)
MCHC: 31.9 g/dL (ref 30.0–36.0)
MCV: 89.4 fL (ref 80.0–100.0)
Platelets: 291 10*3/uL (ref 150–400)
RBC: 3.86 MIL/uL — ABNORMAL LOW (ref 3.87–5.11)
RDW: 15.9 % — ABNORMAL HIGH (ref 11.5–15.5)
WBC: 6.8 10*3/uL (ref 4.0–10.5)
nRBC: 0 % (ref 0.0–0.2)

## 2020-06-01 LAB — GLUCOSE, CAPILLARY
Glucose-Capillary: 123 mg/dL — ABNORMAL HIGH (ref 70–99)
Glucose-Capillary: 136 mg/dL — ABNORMAL HIGH (ref 70–99)
Glucose-Capillary: 76 mg/dL (ref 70–99)
Glucose-Capillary: 112 mg/dL — ABNORMAL HIGH (ref 70–99)
Glucose-Capillary: 129 mg/dL — ABNORMAL HIGH (ref 70–99)

## 2020-06-01 LAB — PATHOLOGIST SMEAR REVIEW

## 2020-06-01 LAB — ALBUMIN, PLEURAL OR PERITONEAL FLUID: Albumin, Fluid: 2.1 g/dL

## 2020-06-01 LAB — LACTATE DEHYDROGENASE, PLEURAL OR PERITONEAL FLUID: LD, Fluid: 198 U/L — ABNORMAL HIGH (ref 3–23)

## 2020-06-01 LAB — LACTATE DEHYDROGENASE: LDH: 115 U/L (ref 98–192)

## 2020-06-01 SURGERY — CHEST TUBE INSERTION
Anesthesia: LOCAL | Laterality: Right

## 2020-06-01 MED ORDER — HYDROMORPHONE HCL 1 MG/ML IJ SOLN
0.5000 mg | INTRAMUSCULAR | Status: DC | PRN
  Administered 2020-06-02: 0.5 mg via INTRAVENOUS
  Filled 2020-06-01: qty 1

## 2020-06-01 MED ORDER — ONDANSETRON HCL 4 MG/2ML IJ SOLN
4.0000 mg | Freq: Four times a day (QID) | INTRAMUSCULAR | Status: DC
  Filled 2020-06-01: qty 2

## 2020-06-01 MED ORDER — POLYETHYLENE GLYCOL 3350 17 G PO PACK
17.0000 g | PACK | Freq: Every day | ORAL | Status: DC
  Administered 2020-06-01: 17 g via ORAL
  Filled 2020-06-01: qty 1

## 2020-06-01 MED ORDER — LABETALOL HCL 300 MG PO TABS
300.0000 mg | ORAL_TABLET | Freq: Every day | ORAL | Status: DC
  Administered 2020-06-01 – 2020-06-02 (×2): 300 mg via ORAL
  Filled 2020-06-01 (×2): qty 1

## 2020-06-01 MED ORDER — INSULIN ASPART 100 UNIT/ML ~~LOC~~ SOLN: 0.0000 [IU] | Freq: Three times a day (TID) | SUBCUTANEOUS | Status: DC

## 2020-06-01 MED ORDER — CHLORHEXIDINE GLUCONATE CLOTH 2 % EX PADS
6.0000 | MEDICATED_PAD | Freq: Every day | CUTANEOUS | Status: DC
  Administered 2020-06-02 – 2020-06-03 (×2): 6 via TOPICAL

## 2020-06-01 MED ORDER — CINACALCET HCL 30 MG PO TABS
30.0000 mg | ORAL_TABLET | ORAL | Status: DC
  Filled 2020-06-01: qty 1

## 2020-06-01 MED ORDER — OXYCODONE HCL 5 MG PO TABS: 5.0000 mg | ORAL_TABLET | Freq: Four times a day (QID) | ORAL | Status: DC | PRN

## 2020-06-01 MED ORDER — INSULIN GLARGINE 100 UNIT/ML ~~LOC~~ SOLN
6.0000 [IU] | Freq: Every day | SUBCUTANEOUS | Status: DC
  Administered 2020-06-02 – 2020-06-03 (×2): 6 [IU] via SUBCUTANEOUS
  Filled 2020-06-01 (×4): qty 0.06

## 2020-06-01 MED ORDER — ONDANSETRON HCL 4 MG/2ML IJ SOLN
4.0000 mg | Freq: Four times a day (QID) | INTRAMUSCULAR | Status: DC | PRN
  Administered 2020-06-02: 4 mg via INTRAVENOUS
  Filled 2020-06-01: qty 2

## 2020-06-01 MED ORDER — HYDROMORPHONE HCL 1 MG/ML IJ SOLN
1.0000 mg | INTRAMUSCULAR | Status: DC | PRN
  Administered 2020-06-01: 1 mg via INTRAVENOUS
  Filled 2020-06-01: qty 1

## 2020-06-01 NOTE — TOC Initial Note (Addendum)
Transition of Care [facility]) - Initial/Assessment Note    Patient Details  Name: [name] MRN: [id number] Date of Birth: 01-Mar-1976  Transition of Care [facility]) CM/SW Contact:    Marilu Favre, RN Phone Number: 06/01/2020, 10:42 AM  Clinical Narrative:                 Spoke to patient at bedside.   Patient from home with daughter.   OT recommended tub seat. Secure chatted OT to clarify the exact piece of DME.   PT to see patient today.   Patient has no DME at home.   Discussed possible need for home oxygen. If needed explained [employer] will deliver portable tank to patient's room prior to discharge and make arrangements to deliver home oxygen DME. Patient voiced understanding.   1500 home oxygen ordered with [employer] with [employer] . Also ordered tub seat.  Per note patient will have a pig tail to suction. Messaged MD regarding home needs. Awaiting determination. Teaching not sure at present if will need pig tail cath at home .  Home oxygen order needs liter flow and [place] or mask, teaching services aware.  Expected Discharge Plan: Home/Self Care     Patient Goals and CMS Choice Patient states their goals for this hospitalization and ongoing recovery are:: to return to home CMS Medicare.gov Compare Post Acute Care list provided to:: Patient    Expected Discharge Plan and Services Expected Discharge Plan: Home/Self Care   Discharge Planning Services: CM Consult                       DME Agency: [employer]                  Prior Living Arrangements/Services   Lives with:: Adult Children Patient language and need for interpreter reviewed:: Yes        Need for Family Participation in Patient Care: Yes (Comment) Care giver support system in place?: Yes (comment)   Criminal Activity/Legal Involvement Pertinent to Current Situation/Hospitalization: No - Comment as needed  Activities of Daily Living Home Assistive Devices/Equipment: None ADL Screening (condition  at time of admission) Patient's cognitive ability adequate to safely complete daily activities?: Yes Is the patient deaf or have difficulty hearing?: No Does the patient have difficulty seeing, even when wearing glasses/contacts?: No Does the patient have difficulty concentrating, remembering, or making decisions?: No Patient able to express need for assistance with ADLs?: No Does the patient have difficulty dressing or bathing?: No Independently performs ADLs?: Yes (appropriate for developmental age) Does the patient have difficulty walking or climbing stairs?: No Weakness of Legs: None Weakness of Arms/Hands: None  Permission Sought/Granted   Permission granted to share information with : No              Emotional Assessment Appearance:: Appears older than stated age Attitude/Demeanor/Rapport: Engaged Affect (typically observed): Accepting Orientation: : Oriented to Self, Oriented to Place, Oriented to  Time, Oriented to Situation Alcohol / Substance Use: Not Applicable Psych Involvement: No (comment)  Admission diagnosis:  Pleural effusion [J90] Hypoxia [R09.02] Difficulty breathing [R06.89] Ascites [R18.8] Patient Active Problem List   Diagnosis Date Noted  . Difficulty breathing 05/30/2020  . Pleural effusion   . Ascites 03/28/2020  . Hypothermia   . Bacteremia 02/11/2020  . Hypoglycemia 02/08/2020  . Hemorrhage from arteriovenous dialysis graft ([place]) 10/20/2019  . Hyperkalemia 04/15/2018  . Nausea 04/15/2018  . Cerebral infarction due to thrombosis of basilar artery ([place])  03/11/2017  . Left hand pain 08/25/2016  . Hyperparathyroidism, secondary renal ([place])   . Benign essential HTN   . Pneumonia 09/12/2014  . CAP (community acquired pneumonia) 09/12/2014  . ESRD on hemodialysis ([place]) 09/12/2014  . Diabetes mellitus type I ([place]) 09/12/2014  . History of diabetic retinopathy 09/12/2014  . HCAP (healthcare-associated pneumonia) 09/11/2014  . Cerebral infarction  due to unspecified mechanism 05/22/2014  . Hypertensive emergency 05/22/2014  . Essential hypertension, benign 05/22/2014  . DM (diabetes mellitus) ([place]) 05/22/2014  . HLD (hyperlipidemia) 05/22/2014  . Cerebral infarction ([place]) 03/29/2014  . Facial droop 03/29/2014  . Mechanical complication of other vascular device, implant, and graft 01/12/2013  . Other complications due to renal dialysis device, implant, and graft 12/08/2012  . ESRD on hemodialysis ([place]) 04/18/2011   PCP:  Trey Sailors, PA Pharmacy:   [facility], [place] [address] Phone: [phone] Fax: [id number] - [place], [place] - [address] AT [place] [address] [place] [address] Phone: [phone] Fax: [phone]     Social Determinants of Health (SDOH) Interventions    Readmission Risk Interventions No flowsheet data found.

## 2020-06-01 NOTE — Progress Notes (Signed)
SATURATION QUALIFICATIONS: (This note is used to comply with regulatory documentation for home oxygen)  Patient Saturations on Room Air at Rest = 96%  Patient Saturations on Room Air while Ambulating = 83%  Patient Saturations on 1 Liters of oxygen while Ambulating = 93%  Please briefly explain why patient needs home oxygen: Needs oxygen to sustain saturations above 90%

## 2020-06-01 NOTE — Progress Notes (Signed)
Patient complains of pain at surgical site and verbalizes " the tylenol I received earlier on did not help me" Dr Larae Grooms informed.

## 2020-06-01 NOTE — Op Note (Addendum)
Insertion of Chest Tube Procedure Note  [name]  [id number]  October 30, 1975  Date:06/01/20  Time:2:42 PM   Provider Performing: Garner Nash, DO, Assistant: Eric Form, NP   Procedure: Pleural Catheter Insertion w/o Imaging Guidance [phone])  Indication(s) Effusion  Consent Risks of the procedure as well as the alternatives and risks of each were explained to the patient and/or caregiver.  Consent for the procedure was obtained and is signed in the bedside chart  Anesthesia Topical only with 1% lidocaine   Time Out Verified patient identification, verified procedure, site/side was marked, verified correct patient position, special equipment/implants available, medications/allergies/relevant history reviewed, required imaging and test results available.  Sterile Technique Maximal sterile technique including full sterile barrier drape, hand hygiene, sterile gown, sterile gloves, mask, hair covering, sterile ultrasound probe cover (if used).  Procedure Description Ultrasound used to identify appropriate pleural anatomy for placement and overlying skin marked. Please reference image below. Area of placement cleaned and draped in sterile fashion.  A 14 French pigtail pleural catheter was placed into the right pleural space using Seldinger technique. Appropriate return of amber fluid was obtained.  The tube was connected to atrium and placed on -20 cm H2O wall suction.  Complications/Tolerance None; patient tolerated the procedure well. Chest X-ray is ordered to verify placement.  EBL Minimal  Specimen(s) fluid  Garner Nash, DO [facility] 06/01/2020 2:43 PM     Right Pleural Effusion:

## 2020-06-01 NOTE — Hospital Course (Addendum)
[name] is a 44 y.o. female presenting with . PMH is significant for Type 1 DM, Hypertension, Hyperlipidemia, Ascites, ESRD on Dialysis TThS, and severe tricuspid regurgitation.  Dyspnea / Pleural Effusion Patient presented with difficulty breathing.  WBC-WNL.  RVP and Covid negative.  Chest x-ray showed significant right pleural effusion.  O2 sats greater than 95 on 2 L of oxygen low flow nasal cannula.  Well score of 0.  IR consulted for thoracentesis.  900 mL of fluid removed.  Stopped due to patient's pain/difficulty breathing.  Obtain cytology.  Based on LDH and light's criteria fluid determined to be exudative, but was in fact Pseudoexudative and and actually transudative based on albumin gradient.  Gram stain identified white blood cells both PMNs and and mononuclear.  No organisms seen.  CT chest abdomen pelvis obtained due to concern for malignancy.  No identifiable malignancy on CT. Consulted Pulmonolgy who placed chest tube to continue draining fluid.  Patient's chest tube was pulled on 11/13.  Indicated patient will need to be seen by thoracic surgery for VATS placement as fluid expected to reaccumulate.  Patient sent home with O2 in stable condition  Abdominal Distension No peritoneal signs or fluid wave.  Obtain abdominal ultrasound.  Significant ascites or evidence of cirrhosis.  Signs of gallbladder wall thickening.  Read is likely chronic no fever or significant abdominal pain.  Alk phos 131.    Tricuspid Regurgitation Had TEE on on that showed tricuspid regurgitation and right ventricular enlargement on 02/13/2020.   Consulted cardiology.  Did not feel this was the cause of pleural effusion.  Has appointment scheduled in January of next year for follow-up.

## 2020-06-01 NOTE — Progress Notes (Signed)
Interim progress note  Paged by patient's nurse regarding the following:  1.  Chest tube: Patient had chest tube placed today by CCM.  Nurse reports there are no orders and regarding the care or settings for the chest tube.  Also reports patient is experiencing a good deal of pain secondary to the chest tube, no additional medicines have been placed to help control patient's pain.  She has tried Tylenol and this is been ineffective. -Asked nurse to call CCM to have specific orders placed regarding chest tube -Placed order for Dilaudid 1 mg (then changed to 0.5 mg) every 4 hours as needed for severe pain (patient has ESRD on HD)  2.  Transfer level of care: Nurse reports that patient is currently on [facility] which is a unit for observation and the nurses there are not trained to manage a chest tube.  Because of this they are recommending patient change to a different floor such as 6 E. or 4 E. where the nurses are trained to manage the chest tube. -Order placed for patient to be transferred to different floor/unit, however according to bed placement there were no beds available on 6 E.  Instructed they can attempt for 4 E., but no guarantees can be made as the hospital is very full.  3.  Insulin: Patient has a history of diabetes for which she is on insulin pump.  The patient's insulin pump battery is about to run out and the patient does not have a charger cable for it.  The nurses have tried a couple of other charger cables but these have been ineffective.  Patient will need orders for insulin.  The nurse was asked to look at the patient's insulin pump and review her insulin usage, but this was unable to be done successfully. -Dr. Larae Grooms and I visited the patient in her room, look at her insulin pump which show the patient has used a total of 13.45 units of insulin today, 5.7 units basal, 6.4 units food bolus, and 1.3 units of correction bolus. -We will place orders for 6 units of Lantus followed by sensitive  sliding scale insulin. -We will continue CBG checks as scheduled.   Milus Banister, [place], PGY-3 06/01/2020 9:17 PM

## 2020-06-01 NOTE — Progress Notes (Signed)
[facility] Progress Note   Subjective:   Patient seen and examined at bedside.  Breathing improved but reports O2 sats dropped in the 70s when ambulating up stairs.  Reports improvement in abdominal distention post procedure but returned today after eating.  Denies n/v/d, abdominal pain, CP, palpitations, weakness, dizziness and fatigue.   Objective Vitals:   05/31/20 1709 05/31/20 2212 06/01/20 0552 06/01/20 1135  BP: (!) 113/93 117/61 126/60 (!) 143/71  Pulse: 82 75 73 77  Resp: (!) 22 20 20 19   Temp: 99.3 F (37.4 C) 98.4 F (36.9 C) 98.4 F (36.9 C) 99.2 F (37.3 C)  TempSrc: Oral Oral Oral Oral  SpO2: 97%   93%  Weight:      Height:       Physical Exam General:WDWN, thin female in NAD Heart:RRR Lungs:BS decreased on R, otherwise CTAB Abdomen:soft, +distended, nontender Extremities:no LE edema Dialysis Access: RU AVF +b/t   Filed Weights   05/30/20 2100 05/31/20 0650 05/31/20 1040  Weight: 53.7 kg 54.2 kg 52.5 kg    Intake/Output Summary (Last 24 hours) at 06/01/2020 1227 Last data filed at 06/01/2020 8101 Gross per 24 hour  Intake 640 ml  Output --  Net 640 ml    Additional Objective Labs: Basic Metabolic Panel: Recent Labs  Lab 05/30/20 1352 05/31/20 0248 06/01/20 0158  NA 137 138 136  K 4.8 4.3 4.3  CL 92* 95* 94*  CO2 28 30 30   GLUCOSE 130* 156* 127*  BUN 22* 26* 20  CREATININE 5.39* 6.42* 4.27*  CALCIUM 9.3 9.4 9.2  PHOS  --  5.9* 4.4   Liver Function Tests: Recent Labs  Lab 05/30/20 1352 05/31/20 0248 06/01/20 0158  AST 23 16  --   ALT 9 9  --   ALKPHOS 127* 131*  --   BILITOT 1.7* 1.3*  --   PROT 7.7 7.5  --   ALBUMIN 3.0* 2.8*  2.9* 2.7*   CBC: Recent Labs  Lab 05/30/20 1352 05/31/20 0248 06/01/20 0158  WBC 5.9 6.7 6.8  NEUTROABS 3.7  --   --   HGB 10.7* 10.2* 11.0*  HCT 34.6* 32.4* 34.5*  MCV 91.3 88.0 89.4  PLT 245 255 291   Blood Culture    Component Value Date/Time   SDES PLEURAL FLUID  05/31/2020 1205   SDES PLEURAL FLUID 05/31/2020 1205   SPECREQUEST RIGHT THORACENTESIS 05/31/2020 1205   SPECREQUEST RIGHT THORACENTESIS 05/31/2020 1205   CULT  05/31/2020 1205    NO GROWTH < 24 HOURS Performed at [facility], [place] [address], [place], [place] [address]    REPTSTATUS 05/31/2020 FINAL 05/31/2020 1205   REPTSTATUS PENDING 05/31/2020 1205   CBG: Recent Labs  Lab 05/31/20 1624 05/31/20 2131 06/01/20 0332 06/01/20 0515 06/01/20 1127  GLUCAP 119* 174* 123* 136* 76   Studies/Results: DG Chest 1 View  Result Date: 05/31/2020 CLINICAL DATA:  Status post right-sided thoracentesis EXAM: CHEST  1 VIEW COMPARISON:  One day prior FINDINGS: Left axillary surgical clips. Midline trachea. Mild cardiomegaly. Decrease in small right pleural effusion. No pneumothorax. Mild interstitial edema, asymmetric right. Improved right sided aeration with lower lung airspace disease remaining. IMPRESSION: No pneumothorax after right-sided thoracentesis. Mild congestive heart failure with residual small right pleural effusion and adjacent airspace disease. Electronically Signed   By: Abigail Miyamoto M.D.   On: 05/31/2020 12:02   DG Chest 2 View  Result Date: 05/30/2020 CLINICAL DATA:  Shortness of breath for several weeks, question recurrent  pneumonia; past history diabetes mellitus, hypertension, end-stage renal disease, former smoker EXAM: CHEST - 2 VIEW COMPARISON:  02/10/2020 FINDINGS: Enlargement of cardiac silhouette with pulmonary vascular congestion. Stable mediastinal contours. Minimal interstitial edema. Large RIGHT pleural effusion with complete atelectasis of RIGHT middle and RIGHT lower lobes and partial atelectasis of RIGHT upper lobe. Subsegmental atelectasis LEFT base. No pneumothorax or acute osseous findings. Bones demineralized. IMPRESSION: Enlargement of cardiac silhouette with pulmonary vascular congestion and mild pulmonary edema. Large RIGHT pleural effusion with marked  atelectasis of RIGHT lung. Minimal subsegmental atelectasis LEFT base. Electronically Signed   By: Lavonia Dana M.D.   On: 05/30/2020 14:29   US Abdomen Complete  Result Date: 05/30/2020 CLINICAL DATA:  Ascites.  Abdominal tenderness EXAM: ABDOMEN ULTRASOUND COMPLETE COMPARISON:  Ultrasound 10/20/2018 FINDINGS: Gallbladder: Gallbladder is nondistended. There is a rim calcification in the wall the gallbladder. There is gallbladder sludge layering dependently. Significant gallbladder wall thickening up to 6 8 mm. Negative sonographic Murphy's sign. Common bile duct: Diameter: Normal at 3 mm Liver: No focal lesion identified. Within normal limits in parenchymal echogenicity. Portal vein is patent on color Doppler imaging with normal direction of blood flow towards the liver. IVC: No abnormality visualized. Pancreas: Visualized portion unremarkable. Spleen: Size and appearance within normal limits. Right Kidney: Length: 7.7 cm. Echogenicity within normal limits. No mass or hydronephrosis visualized. Left Kidney: Length: 6.8 cm. Echogenicity within normal limits. No mass or hydronephrosis visualized. Abdominal aorta: No aneurysm visualized. Other findings: Moderate volume RIGHT pleural effusion. Small volume ascites. IMPRESSION: 1. Abnormal gallbladder with significant gallbladder wall thickening, calcifications in the wall, and sludge within the gallbladder. Favor chronic findings. Consider chronic cholecystitis. Negative sonographic Murphy's sign. 2. Moderate RIGHT pleural effusion ascites. Electronically Signed   By: Suzy Bouchard M.D.   On: 05/30/2020 19:35   CT CHEST ABDOMEN PELVIS W CONTRAST  Result Date: 05/31/2020 CLINICAL DATA:  44 year old female with pleuritic chest pain and shortness of breath. Concern for malignancy. EXAM: CT CHEST, ABDOMEN, AND PELVIS WITH CONTRAST TECHNIQUE: Multidetector CT imaging of the chest, abdomen and pelvis was performed following the standard protocol during bolus  administration of intravenous contrast. CONTRAST:  39mL OMNIPAQUE IOHEXOL 300 MG/ML  SOLN COMPARISON:  CT abdomen pelvis dated 12/25/2014. FINDINGS: CT CHEST FINDINGS Cardiovascular: There is mild cardiomegaly. There is a small pericardial effusion measuring approximately 1 cm in thickness anterior to the heart. The thoracic aorta is unremarkable. The origins of the great vessels of the aortic arch appear patent as visualized. The central pulmonary arteries are patent. Mediastinum/Nodes: No definite hilar adenopathy. Evaluation of the right hilum however is limited due to consolidative changes of the adjacent lung. Subcarinal adenopathy measure up to 19 mm in short axis. The esophagus is grossly unremarkable. The thyroid gland is heterogeneous likely containing multiple nodules. Recommend thyroid US (ref: J Am Coll Radiol. 2015 Feb;12(2): 143-50).There is mild diffuse mediastinal edema. Lungs/Pleura: There is a moderate right pleural effusion. There is consolidative changes of the majority of the right lung which may represent atelectasis or infiltrate. There are scattered clusters of ground-glass opacity in the right upper lobe and right middle lobe which may represent atelectasis or infiltrate. There is a 2.2 x 3.4 cm focal consolidation along the right minor fissure (24/3), possibly atelectasis. A mass is not excluded. There is a trace left pleural effusion and minimal left lung base atelectasis. Several small scattered pneumatoceles noted. There is no pneumothorax. The central airways are patent. Musculoskeletal: There is osteosclerosis consistent with renal osteodystrophy.  No acute osseous pathology. CT ABDOMEN PELVIS FINDINGS No intra-abdominal free air. Small ascites. Hepatobiliary: The liver is enlarged measuring 19 cm in midclavicular length. No intrahepatic biliary ductal dilatation. There is calcified stone within the gallbladder versus calcified gallbladder wall. Pancreas: Unremarkable. No pancreatic  ductal dilatation or surrounding inflammatory changes. Spleen: Normal in size without focal abnormality. Adrenals/Urinary Tract: The adrenal glands unremarkable. Severe bilateral renal parenchyma atrophy. Several small hypodense renal lesions are not characterized on this CT. There is no hydronephrosis or nephrolithiasis on either side. The urinary bladder is collapsed. Stomach/Bowel: The stomach is distended with ingested content. There is no bowel obstruction. The appendix is unremarkable as visualized. Vascular/Lymphatic: The abdominal aorta and IVC are unremarkable. No portal venous gas. No definite adenopathy. Reproductive: The uterus and ovaries are grossly unremarkable. Other: There is diffuse subcutaneous edema and anasarca. Musculoskeletal: Diffuse osseous sclerosis in keeping with renal osteodystrophy. No acute osseous pathology. IMPRESSION: 1. Moderate size right pleural effusion with right lower lobe atelectasis/infiltrate. Focal masslike consolidation along the right minor fissure may represent atelectasis/infiltrate. A mass is not excluded. 2. Cardiomegaly with a small pericardial effusion. 3. Small ascites and anasarca. 4. Hepatomegaly. 5. Cholelithiasis. 6. Severe bilateral renal parenchyma atrophy and findings of renal osteodystrophy. 7. No bowel obstruction. Normal appendix. Electronically Signed   By: Anner Crete M.D.   On: 05/31/2020 21:26   IR THORACENTESIS ASP PLEURAL SPACE W/IMG GUIDE  Result Date: 05/31/2020 INDICATION: Patient with history of ESRD, dyspnea, and right pleural effusion. Request made for diagnostic and therapeutic left thoracentesis. EXAM: ULTRASOUND GUIDED DIAGNOSTIC AND THERAPEUTIC RIGHT THORACENTESIS MEDICATIONS: 10 mL 1% lidocaine COMPLICATIONS: None immediate. PROCEDURE: An ultrasound guided thoracentesis was thoroughly discussed with the patient and questions answered. The benefits, risks, alternatives and complications were also discussed. The patient  understands and wishes to proceed with the procedure. Written consent was obtained. Ultrasound was performed to localize and mark an adequate pocket of fluid in the right chest. The area was then prepped and draped in the normal sterile fashion. 1% Lidocaine was used for local anesthesia. Under ultrasound guidance a 6 Fr Safe-T-Centesis catheter was introduced. Thoracentesis was performed. The catheter was removed and a dressing applied. FINDINGS: A total of approximately 900 mL of hazy amber fluid was removed. Procedure was stopped after 900 mL secondary patient's symptoms (coughing, chest pain). Samples were sent to the laboratory as requested by the clinical team. IMPRESSION: Successful ultrasound guided right thoracentesis yielding 900 mL of pleural fluid. Read by: Earley Abide, PA-C Electronically Signed   By: Ruthann Cancer MD   On: 05/31/2020 12:15    Medications:  . amLODipine  10 mg Oral QHS  . calcium acetate  667 mg Oral TID WC  . Chlorhexidine Gluconate Cloth  6 each Topical Q0600  . [START ON 06/02/2020] cinacalcet  30 mg Oral Once per day on Tue Thu Sat  . doxercalciferol  2 mcg Intravenous Q T,Th,Sa-HD  . heparin  5,000 Units Subcutaneous Q8H  . insulin pump   Subcutaneous TID WC, HS, 0200  . labetalol  300 mg Oral QHS  . losartan  50 mg Oral QPM  . polyethylene glycol  17 g Oral Daily  . rosuvastatin  10 mg Oral QHS    Dialysis Orders: [facility] TTS 425 / 800, 2K 2.25 Ca, EDW 53, 3:30, AVF last tx 11/9 - post wt 52.7, post BP 152/75 runs full tx venofer 50 weekly 11/4 Hb 11.9, no ESA; Phos 7.1, Ca 9.6, PTH 04/2020 445 hectorol  2 TIW, sensipar 30 TIW  Assessment/Plan: 1. R pleural effusion - s/p thoracentesis by IR 0.9L removed.  Appears exudative. Cultures pending. CT post procedure showed moderate R pleural effusion and mass like consolidation on R minor fissure. Plan for chest tube to continue draining effusion with CT after to better assess consolidation. Per  primary. 2. Hypoxia - hypoxia with ambulation. Per pt to d/c home on O2. 3. Abdominal distention - no ascites on [place], but did note chronic cholecystitis.  CT chest/abd on 11/11 showed small ascites and anasarca. Wkup per primary.  2. ESRD - on HD TTS.  HD yesterday tolerated well.  Next HD 11/13 per regular schedule.  K 4.3 3. Anemia of CKD- Hgb 11.0. No indication for ESA 4. Secondary hyperparathyroidism - CA and phos in goal. Continue sensipar, VDRA and binders.  5. HTN/volume - BP well controlled.  Continue home meds.  Does not appear grossly volume overloaded. Net UF 1.5L removed yesterday. A little under dry weight, will challenge weight tomorrow since anasarca noted on CT.  Net UF goal 2L as tolerated.  6. Nutrition - Renal diet w/fluid restrictions. Alb 2.7. +protein supplements.  Jen Mow, PA-C [facility] 06/01/2020,12:27 PM  LOS: 2 days

## 2020-06-01 NOTE — Progress Notes (Signed)
MD at bedside. 

## 2020-06-01 NOTE — Discharge Summary (Addendum)
[facility] Discharge Summary  Patient name: [name] Medical record number: [id number] Date of birth: 08-11-1975 Age: 44 y.o. Gender: female Date of Admission: 05/30/2020  Date of Discharge: 06/03/20 Admitting Physician: Kinnie Feil, MD  Primary Care Provider: Trey Sailors, PA Consultants: IR, Nephrology, Pulmonology  Indication for Hospitalization: Dyspnea  Discharge Diagnoses/Problem List:  Type 1 DM, Hypertension, Hyperlipidemia, Ascites, ESRD on Dialysis TThS, andsevere tricuspid regurgitation.  Disposition: Able to be discharged home safely  Discharge Condition: Stable  Discharge Exam:  General: 44 year old African-American female lying in bed, no acute distress, nasal cannula Cardiovascular: s1 and s2, RRR Respiratory: Normal work of breathing, reduced air entry right mid to lower lobe. Chest tube incision site clean and dry. Abdomen: Mildly distended, firm in right upper quadrant, non tender, bowel sounds present Extremities: no peripheral edema   Brief Hospital Course:  [name] is a 44 y.o. female presenting with . PMH is significant for Type 1 DM, Hypertension, Hyperlipidemia, Ascites, ESRD on Dialysis TThS, and severe tricuspid regurgitation.  Pleural Effusion Patient presented with difficulty breathing. O2 sats improved with 2L of O2. Chest x-ray showed significant right pleural effusion. Remainder of work up for hypoxia was unremarkable.  Well score of 0.  IR consulted for thoracentesis and 900 mL of fluid removed. Stopped due to patient's pain/difficulty breathing. Based on LDH and light's criteria fluid determined to be exudative, but was in fact Pseudoexudative and and actually transudative based on albumin gradient.  Gram stain identified white blood cells both PMNs and and mononuclear.  No organisms seen. CT chest abdomen pelvis obtained due to concern for malignancy.  No identifiable malignancy on CT.  Consulted Pulmonolgy who placed chest tube to continue draining fluid.  Patient's chest tube was pulled on 11/13.  Indicated patient will need to be seen by thoracic surgery for VATS placement as fluid expected to reaccumulate.  Patient sent home with O2 in stable condition  Abdominal Distension Patient reports that she started to have abdominal distention earlier this year.  It is noted on an H&P in April 2021.  In ultrasound during that visit showed ascites & mild increased echogenicity of the liver which may reflect hepatic steatosis.  On bedside ultrasound in ED, hepatomegaly appreciated with no large fluid pocket. On physical exam, + BS, no TTP or fluid wave.  Labs s/f ALP 127, AST/ALT 23/9, total bili 1.7, platelets 245, INR not obtained. Suspected patient's abdominal distention and hepatomegaly are secondary to right heart failure in the setting of patient's severe Tricuspid Regurgitation seen on TEE in 01/2020. Cardiology curbsided and they were not convinced that her hepatomegaly was from cardiac source. Recommended GI follow up.  Obtained repeat abdominal ultrasound which showed no significant ascites or evidence of cirrhosis. CT abdomen pelvis w/ contrast showed hepatomegaly, small ascites. Patient did not have any abdominal pain or acute issues during hospitalization. Recommendations for follow up as outpatient.   Chronic Cholecystitis  Found incidentally on abdominal ultrasound with elevated alk phos. No abdominal pain during hospitalization. Consider follow up with general surgery for elective cholecystecomy.   Tricuspid Regurgitation Had TEE on on that showed tricuspid regurgitation and right ventricular enlargement on 02/13/2020.   Consulted cardiology.  Did not feel this was the cause of pleural effusion.  Has appointment scheduled in January of next year for follow-up.    Issues for Follow Up:  1. Pleural Effusion- Follow up with CVTS for further management of pleural effusion.  Continue  to  ensure patient not having further breathing difficulty or O2 desaturation in the meantime.   2. Hepatomegaly - no acute findings or issues during hospitalization. Monitor for issues outpatient and referral to GI.  3. Chronic cholecystitis - non urgent referral to surgery for elective cholecystectomy   Significant Procedures:   EXAM: ULTRASOUND GUIDED DIAGNOSTIC AND THERAPEUTIC RIGHT THORACENTESIS  MEDICATIONS: 10 mL 1% lidocaine  COMPLICATIONS: None immediate.  PROCEDURE: An ultrasound guided thoracentesis was thoroughly discussed with the patient and questions answered. The benefits, risks, alternatives and complications were also discussed. The patient understands and wishes to proceed with the procedure. Written consent was obtained.  Ultrasound was performed to localize and mark an adequate pocket of fluid in the right chest. The area was then prepped and draped in the normal sterile fashion. 1% Lidocaine was used for local anesthesia. Under ultrasound guidance a 6 Fr Safe-T-Centesis catheter was introduced. Thoracentesis was performed. The catheter was removed and a dressing applied.  FINDINGS: A total of approximately 900 mL of hazy amber fluid was removed. Procedure was stopped after 900 mL secondary patient's symptoms (coughing, chest pain). Samples were sent to the laboratory as requested by the clinical team.  IMPRESSION: Successful ultrasound guided right thoracentesis yielding 900 mL of pleural fluid.  Read by: Earley Abide, PA-C  EXAM: ABDOMEN ULTRASOUND COMPLETE  COMPARISON:  Ultrasound 10/20/2018  FINDINGS: Gallbladder: Gallbladder is nondistended. There is a rim calcification in the wall the gallbladder. There is gallbladder sludge layering dependently. Significant gallbladder wall thickening up to 6 8 mm. Negative sonographic Murphy's sign.  Common bile duct: Diameter: Normal at 3 mm  Liver: No focal lesion identified. Within  normal limits in parenchymal echogenicity. Portal vein is patent on color Doppler imaging with normal direction of blood flow towards the liver.  IVC: No abnormality visualized.  Pancreas: Visualized portion unremarkable.  Spleen: Size and appearance within normal limits.  Right Kidney: Length: 7.7 cm. Echogenicity within normal limits. No mass or hydronephrosis visualized.  Left Kidney: Length: 6.8 cm. Echogenicity within normal limits. No mass or hydronephrosis visualized.  Abdominal aorta: No aneurysm visualized.  Other findings: Moderate volume RIGHT pleural effusion. Small volume ascites.  IMPRESSION: 1. Abnormal gallbladder with significant gallbladder wall thickening, calcifications in the wall, and sludge within the gallbladder. Favor chronic findings. Consider chronic cholecystitis. Negative sonographic Murphy's sign. 2. Moderate RIGHT pleural effusion ascites.    CT CHEST, ABDOMEN, AND PELVIS WITH CONTRAST  TECHNIQUE: Multidetector CT imaging of the chest, abdomen and pelvis was performed following the standard protocol during bolus administration of intravenous contrast.  CONTRAST:  30m OMNIPAQUE IOHEXOL 300 MG/ML  SOLN  COMPARISON:  CT abdomen pelvis dated 12/25/2014.  FINDINGS: CT CHEST FINDINGS  Cardiovascular: There is mild cardiomegaly. There is a small pericardial effusion measuring approximately 1 cm in thickness anterior to the heart. The thoracic aorta is unremarkable. The origins of the great vessels of the aortic arch appear patent as visualized. The central pulmonary arteries are patent.  Mediastinum/Nodes: No definite hilar adenopathy. Evaluation of the right hilum however is limited due to consolidative changes of the adjacent lung. Subcarinal adenopathy measure up to 19 mm in short axis. The esophagus is grossly unremarkable. The thyroid gland is heterogeneous likely containing multiple nodules. Recommend thyroid UKorea(ref:  J Am Coll Radiol. 2015 Feb;12(2): 143-50).There is mild diffuse mediastinal edema.  Lungs/Pleura: There is a moderate right pleural effusion. There is consolidative changes of the majority of the right lung which may represent atelectasis or infiltrate. There are  scattered clusters of ground-glass opacity in the right upper lobe and right middle lobe which may represent atelectasis or infiltrate. There is a 2.2 x 3.4 cm focal consolidation along the right minor fissure (24/3), possibly atelectasis. A mass is not excluded.  There is a trace left pleural effusion and minimal left lung base atelectasis. Several small scattered pneumatoceles noted. There is no pneumothorax. The central airways are patent.  Musculoskeletal: There is osteosclerosis consistent with renal osteodystrophy. No acute osseous pathology.  CT ABDOMEN PELVIS FINDINGS  No intra-abdominal free air. Small ascites.  Hepatobiliary: The liver is enlarged measuring 19 cm in midclavicular length. No intrahepatic biliary ductal dilatation. There is calcified stone within the gallbladder versus calcified gallbladder wall.  Pancreas: Unremarkable. No pancreatic ductal dilatation or surrounding inflammatory changes.  Spleen: Normal in size without focal abnormality.  Adrenals/Urinary Tract: The adrenal glands unremarkable. Severe bilateral renal parenchyma atrophy. Several small hypodense renal lesions are not characterized on this CT. There is no hydronephrosis or nephrolithiasis on either side. The urinary bladder is collapsed.  Stomach/Bowel: The stomach is distended with ingested content. There is no bowel obstruction. The appendix is unremarkable as visualized.  Vascular/Lymphatic: The abdominal aorta and IVC are unremarkable. No portal venous gas. No definite adenopathy.  Reproductive: The uterus and ovaries are grossly unremarkable.  Other: There is diffuse subcutaneous edema and  anasarca.  Musculoskeletal: Diffuse osseous sclerosis in keeping with renal osteodystrophy. No acute osseous pathology.  IMPRESSION: 1. Moderate size right pleural effusion with right lower lobe atelectasis/infiltrate. Focal masslike consolidation along the right minor fissure may represent atelectasis/infiltrate. A mass is not excluded. 2. Cardiomegaly with a small pericardial effusion. 3. Small ascites and anasarca. 4. Hepatomegaly. 5. Cholelithiasis. 6. Severe bilateral renal parenchyma atrophy and findings of renal osteodystrophy. 7. No bowel obstruction. Normal appendix.   Significant Labs and Imaging:  Recent Labs  Lab 06/01/20 0158 06/02/20 0241 06/03/20 0207  WBC 6.8 7.5 6.2  HGB 11.0* 11.3* 10.2*  HCT 34.5* 35.9* 32.1*  PLT 291 333 325   Recent Labs  Lab 05/30/20 1352 05/30/20 1352 05/31/20 0248 05/31/20 0248 06/01/20 0158 06/01/20 0158 06/01/20 1617 06/01/20 1617 06/02/20 0241 06/03/20 0207  NA 137   < > 138  --  136  --  134*  --  133* 134*  K 4.8   < > 4.3   < > 4.3   < > 4.4   < > 5.0 4.3  CL 92*   < > 95*  --  94*  --  93*  --  89* 93*  CO2 28   < > 30  --  30  --  28  --  29 29  GLUCOSE 130*   < > 156*  --  127*  --  112*  --  229* 198*  BUN 22*   < > 26*  --  20  --  27*  --  33* 14  CREATININE 5.39*   < > 6.42*  --  4.27*  --  5.18*  --  5.85* 3.39*  CALCIUM 9.3   < > 9.4  --  9.2  --  9.2  --  9.8 8.9  PHOS  --   --  5.9*  --  4.4  --   --   --   --  3.6  ALKPHOS 127*  --  131*  --   --   --  125  --   --   --   AST 23  --  16  --   --   --  16  --   --   --   ALT 9  --  9  --   --   --  8  --   --   --   ALBUMIN 3.0*  --  2.8*  2.9*  --  2.7*  --  2.8*  --   --  2.6*   < > = values in this interval not displayed.    Gram Stain-  Specimen Description PLEURAL FLUID   Special Requests RIGHT THORACENTESIS   Gram Stain WBC PRESENT,BOTH PMN AND MONONUCLEAR  NO ORGANISMS SEEN  CYTOSPIN SMEAR    Fluid Type-FCT  PLEURAL VC   Comment:  CORRECTED ON 11/11 AT 1253: PREVIOUSLY REPORTED AS CYTO PLEU  Color, Fluid YELLOW YELLOWAbnormal   Appearance, Fluid CLEAR HAZYAbnormal   Total Nucleated Cell Count, Fluid 0 - 1,000 cu mm 395   Neutrophil Count, Fluid 0 - 25 % 49High   Lymphs, Fluid % 16   Monocyte-Macrophage-Serous Fluid 50 - 90 % 33Low   Eos, Fluid % 0    LD, Fluid 3 - 23 U/L 169High     LDH serum- 121  Results/Tests Pending at Time of Discharge: Pathology smear review, Pleural fluid culture   Discharge Medications:  Allergies as of 06/03/2020      Reactions   Pollen Extract Other (See Comments)   Watery eyes      Medication List    TAKE these medications   amLODipine 10 MG tablet Commonly known as: NORVASC Take 10 mg by mouth at bedtime.   Glucagon Emergency 1 MG Kit Inject 1 mg into the muscle once as needed (for severe low blood sugar).   insulin aspart 100 UNIT/ML injection Commonly known as: novoLOG Inject 50 Units into the skin See admin instructions. Use with insulin pump daily   insulin pump Soln Inject into the skin continuous. Novolog   Basal rate 0.29 units/ hour   labetalol 300 MG tablet Commonly known as: NORMODYNE TAKE 1 TABLET(300 MG) BY MOUTH TWICE DAILY What changed: See the new instructions.   losartan 50 MG tablet Commonly known as: COZAAR Take 50 mg by mouth every evening.   PhosLo 667 MG capsule Generic drug: calcium acetate Take 667 mg by mouth 3 (three) times daily with meals. May take an additional 667 mg dose with snacks   rosuvastatin 10 MG tablet Commonly known as: CRESTOR Take 10 mg by mouth at bedtime.            Discharge Care Instructions  (From admission, onward)         Start     Ordered   06/03/20 0000  If the dressing is still on your incision site when you go home, remove it on the third day after your surgery date. Remove dressing if it begins to fall off, or if it is dirty or damaged before the third day.        06/03/20 1513           Discharge Instructions: Please refer to Patient Instructions section of EMR for full details.  Patient was counseled important signs and symptoms that should prompt return to medical care, changes in medications, dietary instructions, activity restrictions, and follow up appointments.   Follow-Up Appointments:   Follow-up Information    Melrose Nakayama, MD. Schedule an appointment as soon as possible for a visit in 1 week(s).   Specialty: Cardiothoracic Surgery Why: For  hospital follow up and consultation for surgery in right lung  Contact information: [address] [place] [address] [phone]        [facility] Follow up.   Why: home 02 arranged- portable tank to be delivered to room prior to discharge- will contact for home equipment delivery Contact information: [address] [place] [address] [phone]                 Delora Fuel, MD 06/05/2020, 5:53 PM PGY-3, [place] Upper-Level Resident Addendum I have discussed the above with the original author and agree with their documentation. My edits for correction/addition/clarification are included. Please see also any attending notes.   Wilber Oliphant, M.D.  PGY-3 06/06/2020 12:02 PM

## 2020-06-01 NOTE — Evaluation (Signed)
Physical Therapy Evaluation Patient Details Name: [name] MRN: [id number] DOB: 01-Feb-1976 Today's Date: 06/01/2020   History of Present Illness  Pt is a 44 year old woman admitted with difficulty breathing on 11/10. Underwent thoracentesis on 05/31/20. PMH: type 1 diabetes, ESRD, HTN, ascites, severe tricuspid regurgitation.  Clinical Impression  Pt in OOB in recliner upon arrival of PT, agreeable to evaluation at this time. Prior to admission the pt was completely independent, living at home with her 83 yo daughter. The pt now presents with minor limitations in functional endurance due to above dx, but was able to mobilize with improved activity tolerance with addition of 1L O2. The pt was able to demo good stability with all transfers, gait, and stairs, and maintained SpO2 >94% when on 1L O2 with activity and >94% on RA at rest. The pt was educated on energy conservation, use of pulseox for guidance at home, and activity pacing. The pt was agreeable to PT education and has no further PT needs at this time.   SATURATION QUALIFICATIONS: (This note is used to comply with regulatory documentation for home oxygen)  Patient Saturations on Room Air at Rest = 96%  Patient Saturations on Room Air while Ambulating = 83%  Patient Saturations on 1 Liters of oxygen while Ambulating = 94%  Please briefly explain why patient needs home oxygen: Pt unable to sustain >90% without additional O2 during activity.      Follow Up Recommendations No PT follow up    Equipment Recommendations  None recommended by PT (oxygen)    Recommendations for Other Services       Precautions / Restrictions Precautions Precautions: Other (comment) Precaution Comments: watch 02 Restrictions Weight Bearing Restrictions: No      Mobility  Bed Mobility Overal bed mobility: Independent                  Transfers Overall transfer level: Independent Equipment used: None                 Ambulation/Gait Ambulation/Gait assistance: Supervision Gait Distance (Feet): 250 Feet Assistive device: None Gait Pattern/deviations: WFL(Within Functional Limits) Gait velocity: 1.3 m/s Gait velocity interpretation: >2.62 ft/sec, indicative of community ambulatory General Gait Details: WFL with gait, supervision for SpO2 level with mobility. low of 83% with RA, as the pt maintained well for ~100 ft then dropped quickly. Maintained 94-98% on 1L O2  Stairs Stairs: Yes Stairs assistance: Min guard Stair Management: One rail Left;Alternating pattern Number of Stairs: 11 (1 x 8) General stair comments: increased workk of breathing, SpO2 drop to 80s on RA, maintained in 90s with guided breathing and 1L O2      Balance Overall balance assessment: No apparent balance deficits (not formally assessed)                                           Pertinent Vitals/Pain Pain Assessment: No/denies pain    Home Living Family/patient expects to be discharged to:: Private residence Living Arrangements: Children (29 yo daughter) Available Help at Discharge: Family;Available PRN/intermittently Type of Home: Other(Comment) (townhome) Home Access: Level entry     Home Layout: Two level (laundry upstairs) Home Equipment: None      Prior Function Level of Independence: Independent         Comments: not working, daughter at school during day     Journalist, newspaper  Dominant Hand: Right    Extremity/Trunk Assessment   Upper Extremity Assessment Upper Extremity Assessment: Overall WFL for tasks assessed    Lower Extremity Assessment Lower Extremity Assessment: Defer to PT evaluation (limited endurance)    Cervical / Trunk Assessment Cervical / Trunk Assessment: Normal  Communication   Communication: No difficulties  Cognition Arousal/Alertness: Awake/alert Behavior During Therapy: WFL for tasks assessed/performed Overall Cognitive Status: Within Functional  Limits for tasks assessed                                        General Comments General comments (skin integrity, edema, etc.): VSS on 1L with activity    Exercises     Assessment/Plan    PT Assessment Patent does not need any further PT services  PT Problem List Decreased activity tolerance       PT Treatment Interventions Stair training;Gait training;Functional mobility training;Patient/family education    PT Goals (Current goals can be found in the Care Plan section)  Acute Rehab PT Goals Patient Stated Goal: find out why she is short of breath PT Goal Formulation: With patient Time For Goal Achievement: 06/15/20 Potential to Achieve Goals: Good     AM-PAC PT "6 Clicks" Mobility  Outcome Measure Help needed turning from your back to your side while in a flat bed without using bedrails?: None Help needed moving from lying on your back to sitting on the side of a flat bed without using bedrails?: None Help needed moving to and from a bed to a chair (including a wheelchair)?: None Help needed standing up from a chair using your arms (e.g., wheelchair or bedside chair)?: None Help needed to walk in hospital room?: None Help needed climbing 3-5 steps with a railing? : A Little 6 Click Score: 23    End of Session Equipment Utilized During Treatment: Gait belt;Oxygen Activity Tolerance: Patient tolerated treatment well Patient left: in chair;with call bell/phone within reach Nurse Communication: Mobility status (O2 needs) PT Visit Diagnosis: Difficulty in walking, not elsewhere classified (R26.2)    Time: 7915-0569 PT Time Calculation (min) (ACUTE ONLY): 25 min   Charges:   PT Evaluation $PT Eval Low Complexity: 1 Low PT Treatments $Gait Training: 8-22 mins        Karma Ganja, PT, DPT   Acute Rehabilitation Department Pager #: [phone]  Otho Bellows 06/01/2020, 11:02 AM

## 2020-06-01 NOTE — Progress Notes (Signed)
Family Medicine Teaching Service Daily Progress Note Intern Pager: [phone]  Patient name: [name] Medical record number: [id number] Date of birth: 1976/04/25 Age: 44 y.o. Gender: female  Primary Care Provider: Trey Sailors, PA Consultants: Nephro, Cards, Pulm Code Status: Full  Pt Overview and Major Events to Date:  Admitted 11/10  Assessment and Plan: Britton Perkinson Brooksis a 44 y.o.femalepresenting with . PMH is significant for Type 1 DM, Hypertension, Hyperlipidemia, Ascites, ESRD on Dialysis TThS, andsevere tricuspid regurgitation.   Dyspnea / Pleural Effusion Partial Thorocentesis yesterday.  No sign of acute infection, or malignancy seen on CT. Satting >95 on 2L LFNC O2.  Patient indicates she is currently breathing comfortably.  Good air movement heard on left side and significant improvement on right side.   - Pulm consulted, appreciate recs - Continuous Pulse Oximetry, O2 PRN to keep sats >92%, Vital Signs q4hr  - Follow up pleural fluid labs/cytology  Tricuspid Regurgitation Seen on TEE in 02/13/20.  Has appt with Cardiology in January. - No further management  Abdominal Distension Obtained Abdominal Ultrasound.  Showed no significant Ascites or evidence of Cirrhosis.  Did identify Chronic Cholecystitis.  ALT AST normal.  Alk Phos- 131.  No abdominal tenderness to deep palapation. - Will hold off on consulting GI - Miralax ordered for constipation  Type 1 Diabetes Mellitis Has Insulin pump.11/11 A1C- 7.5 - Continue Insulin pump, pharmacy consulted - CBG's with meals   Hyperlipidemia Patient on rosuvastatin 10 mg at home -Continuehome rosuvastatin  Anemia, normocytic Hemoglobin 10.7, at baseline.  -AM CBC   FEN/GI:Carb controlled/renal Prophylaxis:Heparin   Status is: Inpatient  Remains inpatient appropriate because:Ongoing diagnostic testing needed not appropriate for outpatient work up   Dispo:  Patient From: Home  Planned  Disposition: Home  Expected discharge date: 06/03/20  Medically stable for discharge: No    Subjective:  Patient currently resting comfortably in bed.  Indicates breathing fine on 2L O2.  Does endorse constipation.    Objective: Temp:  [97.7 F (36.5 C)-99.3 F (37.4 C)] 98.4 F (36.9 C) (11/12 0552) Pulse Rate:  [73-82] 73 (11/12 0552) Resp:  [14-24] 20 (11/12 0552) BP: (113-134)/(58-93) 126/60 (11/12 0552) SpO2:  [97 %-100 %] 97 % (11/11 1709) Weight:  [52.5 kg] 52.5 kg (11/11 1040) Physical Exam:  Physical Exam Constitutional:      Appearance: She is well-developed.  HENT:     Head: Normocephalic and atraumatic.     Mouth/Throat:     Mouth: Mucous membranes are moist.  Cardiovascular:     Rate and Rhythm: Normal rate and regular rhythm.  Pulmonary:     Effort: Pulmonary effort is normal.     Breath sounds: Examination of the right-middle field reveals decreased breath sounds. Examination of the right-lower field reveals decreased breath sounds. Decreased breath sounds present. No wheezing, rhonchi or rales.  Abdominal:     Palpations: Abdomen is soft.     Tenderness: There is no abdominal tenderness.  Skin:    General: Skin is warm.  Neurological:     Mental Status: She is alert.   Laboratory: Recent Labs  Lab 05/30/20 1352 05/31/20 0248 06/01/20 0158  WBC 5.9 6.7 6.8  HGB 10.7* 10.2* 11.0*  HCT 34.6* 32.4* 34.5*  PLT 245 255 291   Recent Labs  Lab 05/30/20 1352 05/31/20 0248 06/01/20 0158  NA 137 138 136  K 4.8 4.3 4.3  CL 92* 95* 94*  CO2 28 30 30   BUN 22* 26* 20  CREATININE 5.39*  6.42* 4.27*  CALCIUM 9.3 9.4 9.2  PROT 7.7 7.5  --   BILITOT 1.7* 1.3*  --   ALKPHOS 127* 131*  --   ALT 9 9  --   AST 23 16  --   GLUCOSE 130* 156* 127*     Imaging/Diagnostic Tests:  CLINICAL DATA:  44 year old female with pleuritic chest pain and shortness of breath. Concern for malignancy.  EXAM: CT CHEST, ABDOMEN, AND PELVIS WITH  CONTRAST  TECHNIQUE: Multidetector CT imaging of the chest, abdomen and pelvis was performed following the standard protocol during bolus administration of intravenous contrast.  CONTRAST:  78m OMNIPAQUE IOHEXOL 300 MG/ML  SOLN  COMPARISON:  CT abdomen pelvis dated 12/25/2014.  FINDINGS: CT CHEST FINDINGS  Cardiovascular: There is mild cardiomegaly. There is a small pericardial effusion measuring approximately 1 cm in thickness anterior to the heart. The thoracic aorta is unremarkable. The origins of the great vessels of the aortic arch appear patent as visualized. The central pulmonary arteries are patent.  Mediastinum/Nodes: No definite hilar adenopathy. Evaluation of the right hilum however is limited due to consolidative changes of the adjacent lung. Subcarinal adenopathy measure up to 19 mm in short axis. The esophagus is grossly unremarkable. The thyroid gland is heterogeneous likely containing multiple nodules. Recommend thyroid [place](ref: J Am Coll Radiol. 2015 Feb;12(2): 143-50).There is mild diffuse mediastinal edema.  Lungs/Pleura: There is a moderate right pleural effusion. There is consolidative changes of the majority of the right lung which may represent atelectasis or infiltrate. There are scattered clusters of ground-glass opacity in the right upper lobe and right middle lobe which may represent atelectasis or infiltrate. There is a 2.2 x 3.4 cm focal consolidation along the right minor fissure (24/3), possibly atelectasis. A mass is not excluded.  There is a trace left pleural effusion and minimal left lung base atelectasis. Several small scattered pneumatoceles noted. There is no pneumothorax. The central airways are patent.  Musculoskeletal: There is osteosclerosis consistent with renal osteodystrophy. No acute osseous pathology.  CT ABDOMEN PELVIS FINDINGS  No intra-abdominal free air. Small ascites.  Hepatobiliary: The liver is enlarged  measuring 19 cm in midclavicular length. No intrahepatic biliary ductal dilatation. There is calcified stone within the gallbladder versus calcified gallbladder wall.  Pancreas: Unremarkable. No pancreatic ductal dilatation or surrounding inflammatory changes.  Spleen: Normal in size without focal abnormality.  Adrenals/Urinary Tract: The adrenal glands unremarkable. Severe bilateral renal parenchyma atrophy. Several small hypodense renal lesions are not characterized on this CT. There is no hydronephrosis or nephrolithiasis on either side. The urinary bladder is collapsed.  Stomach/Bowel: The stomach is distended with ingested content. There is no bowel obstruction. The appendix is unremarkable as visualized.  Vascular/Lymphatic: The abdominal aorta and IVC are unremarkable. No portal venous gas. No definite adenopathy.  Reproductive: The uterus and ovaries are grossly unremarkable.  Other: There is diffuse subcutaneous edema and anasarca.  Musculoskeletal: Diffuse osseous sclerosis in keeping with renal osteodystrophy. No acute osseous pathology.  IMPRESSION: 1. Moderate size right pleural effusion with right lower lobe atelectasis/infiltrate. Focal masslike consolidation along the right minor fissure may represent atelectasis/infiltrate. A mass is not excluded. 2. Cardiomegaly with a small pericardial effusion. 3. Small ascites and anasarca. 4. Hepatomegaly. 5. Cholelithiasis. 6. Severe bilateral renal parenchyma atrophy and findings of renal osteodystrophy. 7. No bowel obstruction. Normal appendix.   Electronically Signed   By: AAnner CreteM.D.   On: 05/31/2020 21:26  MDelora Fuel MD 06/01/2020, 8:47 AM PGY-1, [facility] Intern pager: [phone], text pages welcome

## 2020-06-01 NOTE — Progress Notes (Signed)
Informed in shift change  patient has an insulin pump. Patient informed me her insulin pump only had 5% battery life left and she forgot her charger at home. Dr Larae Grooms made aware patient will need an alternative plan of care  Patient also has a chest tube. Floor AD and Dr Larae Grooms informed that nurses on this unit have not been trained to take care of patients with chest tubes. Besides patient has no orders for the chest tube. Outgoing RN informed this  that she was under the impression patient was going to have a pleurX catheter but arrived on the unit with a chest tube which was hooked up to -20cm wall suction with no orders.

## 2020-06-01 NOTE — Progress Notes (Addendum)
eLink Physician-Brief Progress Note Patient Name: [name] DOB: 1975-12-10 MRN: [id number]   Date of Service  06/01/2020  HPI/Events of Note  Chest tube placed today by Dr. Valeta Harms for large R pleural effusion. No nursing orders for chest tube.   eICU Interventions  Plan: 1. R chest tube to -20 suction.      Intervention Category Major Interventions: Other:  Lysle Dingwall 06/01/2020, 9:19 PM

## 2020-06-01 NOTE — Consult Note (Addendum)
NAME:  [name], MRN:  [id number], DOB:  1976-06-09, LOS: 2 ADMISSION DATE:  05/30/2020, CONSULTATION DATE:  06/01/2020 REFERRING MD: Carmelina Noun , CHIEF COMPLAINT:  Pleural effusion , previously tapped  Brief History   Faith S Brooksis a 44 y.o.female never smoker presenting 11/10 with 1 week history of dyspnea with sats of 87% on RA.  PMH is significant for Type 1 DM, Hypertension, Hyperlipidemia, Ascites, ESRD on Dialysis TThS, andsevere tricuspid regurgitation, diastolic HF.  CXR was + for large right pleural effusion. SP Right  Thoracentesis 11/11, exudative , micro pending.  CT Chest with concern for Focal masslike consolidation along the right minor fissure may represent atelectasis/infiltrate.  PCCM consulted 11/12 re: possible etiologies/ follow up recommendations.   History of present illness   Faith Tipping Brooksis a 44 y.o.femalenever smoker presenting 05/30/2020 with 1 week history of dyspnea with oxygen sats of 87% on RA, . PMH is significant for Type 1 DM ( Diagnosed at 44 years old) , Hypertension, Hyperlipidemia, Ascites, ESRD on Dialysis TThS, andsevere tricuspid regurgitation, diastolic HF.CXR was + for large right pleural effusion.  CT Chest with concern for Focal masslike consolidation along the right minor fissure may represent atelectasis/infiltrate. No fever or leukocytosis. She is  SP right  Thoracentesis 11/11, 900 cc's of hazy amber fluid. Exudative per LDH criteria only. Neutrophil pre dominent.  Procedure was stopped due to patient's symptoms of cough and chest pain. Appeared  was exudative based on LDH criteria , micro is pending.There was concern for abdominal ascites as patient had abdominal distension. Abdominal U/S negative for ascites but showed chronic cholecystitis. She states she did miss her scheduled HD treatment 11/6 as she was in the [place] on vacation and her flight was cancelled. She did go to HD 11/9. She states her fluid off each  treatment is 1.5- 2.5 L  Dry weight she thinks is 53 KG. She states she does not weigh daily, or watch salt intake.  Of note, patient was admitted in July 2021 for pneumonia. She states she was in the hospital x 1 week. She had Streptococcus Vestibularis in a blood Culture on 7/21. She was treated with ancef , with negative culture at discharge. PCCM consulted 11/12 re: possible etiologies/ recommendations.  Past Medical History   Past Medical History:  Diagnosis Date  . Anemia   . Arthritis   . Ascites 03/28/2020  . Diabetes mellitus   . Diabetic retinopathy ([place])    Hx of laser Rx's  . DM type 1 (diabetes mellitus, type 1) ([place]) 09/12/2014   Age of onset for DM type 1 was age 86.     . Enlarged thyroid gland   . ESRD on hemodialysis ([place])    ESRD due to DM type I, age of onset DM 1 was age 13.  Went on dialysis in March 2012.  Gets HD now at [facility] on a TTS schedule.    Marland Kitchen ESRD on hemodialysis ([place]) 09/12/2014   ESRD due to DM type 1.  Started HD in 2012 at [facility].  Gets HD there on a TTS schedule.    Marland Kitchen GERD (gastroesophageal reflux disease)   . History of blood transfusion   . Hyperlipidemia   . Hypertension   . Peripheral vascular disease ([place])   . Pneumonia     Significant Hospital Events   11/10>> Admission with large R effusion 11/12 PCCM consult  Consults:  11/12 PCCM 11/10 Renal  Procedures:  11/11 Right Thoracentesis >>  900 cc's hazy amber fluid Serum LDH 121 Pleural Fluid LDH 169 Ratio 0.7 Neutrophil predominant at 49%  Significant Diagnostic Tests:  CXR 11/10 Enlargement of cardiac silhouette with pulmonary vascular congestion and mild pulmonary edema. Large RIGHT pleural effusion with marked atelectasis of RIGHT lung. Minimal subsegmental atelectasis LEFT base.   CT Abdomen 11/11 Moderate size right pleural effusion with right lower lobe atelectasis/infiltrate. Focal masslike consolidation along the right minor fissure may represent  atelectasis/infiltrate. A mass is not excluded. Cardiomegaly with a small pericardial effusion. Small ascites and anasarca. Hepatomegaly. Cholelithiasis. Severe bilateral renal parenchyma atrophy and findings of renal osteodystrophy. No bowel obstruction. Normal appendix.  05/30/2020 US Abdomen Abnormal gallbladder with significant gallbladder wall thickening, calcifications in the wall, and sludge within the gallbladder. Favor chronic findings. Consider chronic cholecystitis. Negative sonographic Murphy's sign. 2. Moderate RIGHT pleural effusion ascites.  TEE 02/13/2020 EF:  60  to 65%. The left ventricle has normal function. The left ventricle has no  regional wall motion abnormalities. The left ventricular internal cavity  size was normal in size. There is  no left ventricular hypertrophy.   Right Ventricle: The right ventricular size is severely enlarged. Right  vetricular wall thickness was not assessed. Right ventricular systolic  function is severely reduced.   Left Atrium: Left atrial size was mildly dilated. No left atrial/left  atrial appendage thrombus was detected.   Right Atrium: Right atrial size was severely dilated.   Pericardium: There is no evidence of pericardial effusion.   Mitral Valve: The mitral valve is normal in structure. Normal mobility of  the mitral valve leaflets. No evidence of mitral valve regurgitation. No  evidence of mitral valve stenosis.   Tricuspid Valve: The tricuspid valve is normal in structure. Tricuspid  valve regurgitation is severe. No evidence of tricuspid stenosis.   Aortic Valve: The aortic valve is normal in structure. Aortic valve  regurgitation is not visualized. No aortic stenosis is present.   Pulmonic Valve: The pulmonic valve was normal in structure. Pulmonic valve  regurgitation is mild. No evidence of pulmonic stenosis.   Aorta: The aortic root is normal in size and structure.   Venous: The inferior vena cava  is normal in size with greater than 50%  respiratory variability, suggesting right atrial pressure of 3 mmHg.   IAS/Shunts: No atrial level shunt detected by color flow Doppler.   Additional Comments: No SBE or vegetations seen.    Micro Data:  11/10 SARS Coronavirus 2 by RT PCR NEGATIVE NEGATIVE   11/10 Influenza A by PCR NEGATIVE NEGATIVE  NEGATIVE  NEGATIVE   Influenza B by PCR NEGATIVE NEGATIVE  NEGATIVE CM  NEGATIVE    11/11 Right Pleural Fluid GS>> No organism seen 11/11 Right Pkleaural Fluid for Culture>> Pending  Antimicrobials:  None   Interim history/subjective:  Pt is OOB in Chair, NAD on 0.5 L In NAD Sats are 97% Pt. States she continues to get short of breath with ambulation, especially when climbing stairs.  T max is 99.3 Pleural Fluid cultures are pending Net negative 860 WBC is 6.8  Objective   Blood pressure 126/60, pulse 73, temperature 98.4 F (36.9 C), temperature source Oral, resp. rate 20, height 5\' 3"  (1.6 m), weight 52.5 kg, SpO2 97 %.        Intake/Output Summary (Last 24 hours) at 06/01/2020 1024 Last data filed at 06/01/2020 5409 Gross per 24 hour  Intake 640 ml  Output 1500 ml  Net -860 ml   [employer]  05/30/20 2100 05/31/20 0650 05/31/20 1040  Weight: 53.7 kg 54.2 kg 52.5 kg    Examination: General: Awake and alert female OOB in chair, NAD HENT: NCAT, MM Pink and moist, No JVD, No LAD Lungs: Bilateral chest excursion, Right sided crackles and rhonchi, diminished per R base.  Cardiovascular: S1, S2, RRR No RMG Abdomen: Slightly distended, soft, NT, BS + Extremities: No obvious deformities, Warm and dry, no edema Neuro: Awake and alert, appropriate, MAE x 4 , A&O x 3 GU: NA  Resolved Hospital Problem list     Assessment & Plan:  Large Right Pleural Effusion Drained 11/11 by IR Exudative by LDH Criteria only Serum LDH 121 Pleural Fluid LDH 169 Ratio 0.7 Neutrophil predominant at 49%>> culture pending CT Chest/  Abdomen / Pelvis  with right lower lobe atelectasis/infiltrate.Focal masslike consolidation along the right minor fissure may represent atelectasis/infiltrate. A mass not excluded. Cardiomegaly with a small pericardial effusion. Small ascites and anasarca. Hepatomegaly.>> LFT's WNL Albumin 2.7 Plan Pigtail cath to suction drainage  for complete evacuation  of effusion Additional sampling of Pleural Fluid for Cytology, Protein,  Albumin with serum markers for comparison. Follow micro>> pleural fluid 11/11 Once effusion is completely drained, will CT Chest to re-evaluate focal masslike consolidation along right major fissure. Aggressive Pulmonary Toilet OOB to chair IS Q 1 while awake  Trend WBC and Fever Curve Trend BNP      Best practice:  Diet: Per Primary Pain/Anxiety/Delirium protocol (if indicated): Per Primary VAP protocol (if indicated): NA DVT prophylaxis: Per Primary GI prophylaxis: Per primary Glucose control: CBG/ SSI Mobility: OOB to chair Code Status: Full Family Communication: Pt.updated at bedside Disposition: Per Primary  Labs   CBC: Recent Labs  Lab 05/30/20 1352 05/31/20 0248 06/01/20 0158  WBC 5.9 6.7 6.8  NEUTROABS 3.7  --   --   HGB 10.7* 10.2* 11.0*  HCT 34.6* 32.4* 34.5*  MCV 91.3 88.0 89.4  PLT 245 255 267    Basic Metabolic Panel: Recent Labs  Lab 05/30/20 1352 05/31/20 0248 06/01/20 0158  NA 137 138 136  K 4.8 4.3 4.3  CL 92* 95* 94*  CO2 28 30 30   GLUCOSE 130* 156* 127*  BUN 22* 26* 20  CREATININE 5.39* 6.42* 4.27*  CALCIUM 9.3 9.4 9.2  PHOS  --  5.9* 4.4   GFR: Estimated Creatinine Clearance: 14.3 mL/min (A) (by C-G formula based on SCr of 4.27 mg/dL (H)). Recent Labs  Lab 05/30/20 1352 05/31/20 0248 06/01/20 0158  WBC 5.9 6.7 6.8    Liver Function Tests: Recent Labs  Lab 05/30/20 1352 05/31/20 0248 06/01/20 0158  AST 23 16  --   ALT 9 9  --   ALKPHOS 127* 131*  --   BILITOT 1.7* 1.3*  --   PROT 7.7 7.5  --    ALBUMIN 3.0* 2.8*  2.9* 2.7*   No results for input(s): LIPASE, AMYLASE in the last 168 hours. No results for input(s): AMMONIA in the last 168 hours.  ABG    Component Value Date/Time   HCO3 10.4 (L) 10/06/2008 2235   TCO2 26 02/13/2020 1355   ACIDBASEDEF 16.8 (H) 10/06/2008 2235   O2SAT 51.9 10/06/2008 2235     Coagulation Profile: No results for input(s): INR, PROTIME in the last 168 hours.  Cardiac Enzymes: No results for input(s): CKTOTAL, CKMB, CKMBINDEX, TROPONINI in the last 168 hours.  HbA1C: Hgb A1c MFr Bld  Date/Time Value Ref Range Status  05/31/2020 02:48 AM 7.5 (H)  4.8 - 5.6 % Final    Comment:    (NOTE) Pre diabetes:          5.7%-6.4%  Diabetes:              >6.4%  Glycemic control for   <7.0% adults with diabetes   02/08/2020 11:55 PM 6.2 (H) 4.8 - 5.6 % Final    Comment:    (NOTE) Pre diabetes:          5.7%-6.4%  Diabetes:              >6.4%  Glycemic control for   <7.0% adults with diabetes     CBG: Recent Labs  Lab 05/31/20 1212 05/31/20 1624 05/31/20 2131 06/01/20 0332 06/01/20 0515  GLUCAP 170* 119* 174* 123* 136*    Review of Systems:   Gen: Denies fever, chills, weight change, fatigue, night sweats HEENT: Denies blurred vision, double vision, hearing loss, tinnitus, sinus congestion, rhinorrhea, sore throat, neck stiffness, dysphagia PULM: + shortness of breath, No cough, No sputum production, No hemoptysis, wheezing CV: Denies chest pain, edema, orthopnea, paroxysmal nocturnal dyspnea, palpitations GI: Denies abdominal pain, nausea, vomiting, diarrhea, hematochezia, melena, constipation, change in bowel habits, + abdominal fullness GU: Denies dysuria, hematuria, polyuria, oliguria, urethral discharge Endocrine: Denies hot or cold intolerance, polyuria, polyphagia or appetite change Derm: Denies rash, dry skin, scaling or peeling skin change Heme: Denies easy bruising, bleeding, bleeding gums Neuro: Denies headache,  numbness, weakness, slurred speech, loss of memory or consciousness  Past Medical History  She,  has a past medical history of Anemia, Arthritis, Ascites (03/28/2020), Diabetes mellitus, Diabetic retinopathy ([place]), DM type 1 (diabetes mellitus, type 1) ([place]) (09/12/2014), Enlarged thyroid gland, ESRD on hemodialysis ([place]), ESRD on hemodialysis ([place]) (09/12/2014), GERD (gastroesophageal reflux disease), History of blood transfusion, Hyperlipidemia, Hypertension, Peripheral vascular disease ([place]), and Pneumonia.   Surgical History    Past Surgical History:  Procedure Laterality Date  . ARTERIOVENOUS GRAFT PLACEMENT    . ARTERY REPAIR Left 12/10/2012   Procedure: BRACHIAL ARTERY REPAIR;  Surgeon: Angelia Mould, MD;  Location: [facility] OR;  Service: Vascular;  Laterality: Left;  Exploration Left Brachial Artery for AVF  . AV FISTULA PLACEMENT Right 01/11/2018   Procedure: INSERTION OF ARTERIOVENOUS (AV) GORE-TEX GRAFT  UPPER ARM;  Surgeon: Angelia Mould, MD;  Location: [facility];  Service: Vascular;  Laterality: Right;  . BASCILIC VEIN TRANSPOSITION Right 09/28/2017   Procedure: FIRST STAGE BASILIC VEIN TRANSPOSITION;  Surgeon: Angelia Mould, MD;  Location: [facility];  Service: Vascular;  Laterality: Right;  . CESAREAN SECTION    . EYE SURGERY     [place]  . EYE SURGERY Left   . IR THORACENTESIS ASP PLEURAL SPACE W/IMG GUIDE  05/31/2020  . REVISION OF ARTERIOVENOUS GORETEX GRAFT Left 08/01/2016   Procedure: REVISION OF ARTERIOVENOUS GORETEX GRAFT;  Surgeon: Angelia Mould, MD;  Location: [facility];  Service: Vascular;  Laterality: Left;  . REVISION OF ARTERIOVENOUS GORETEX GRAFT Right 10/21/2019   Procedure: REVISION OF ARTERIOVENOUS GORETEX GRAFT ARM;  Surgeon: Rosetta Posner, MD;  Location: [facility];  Service: Vascular;  Laterality: Right;  . SHUNTOGRAM Left 11/30/2013   Procedure: SHUNTOGRAM;  Surgeon: Serafina Mitchell, MD;  Location: [facility] CATH LAB;  Service: Cardiovascular;  Laterality: Left;   . TEE WITHOUT CARDIOVERSION N/A 02/13/2020   Procedure: TRANSESOPHAGEAL ECHOCARDIOGRAM (TEE);  Surgeon: Josue Hector, MD;  Location: [facility] ENDOSCOPY;  Service: Cardiovascular;  Laterality: N/A;     Social History  reports that she has never smoked. She has never used smokeless tobacco. She reports that she does not drink alcohol and does not use drugs.   Family History   Her family history includes Cancer in her mother; Hypertension in her father.   Allergies Allergies  Allergen Reactions  . Pollen Extract Other (See Comments)    Watery eyes     Home Medications  Prior to Admission medications   Medication Sig Start Date End Date Taking? Authorizing Provider  amLODipine (NORVASC) 10 MG tablet Take 10 mg by mouth at bedtime.    Yes [provider]  calcium acetate (PHOSLO) 667 MG capsule Take 667 mg by mouth 3 (three) times daily with meals. May take an additional 667 mg dose with snacks   Yes [provider]  GLUCAGON EMERGENCY 1 MG injection Inject 1 mg into the muscle once as needed (for severe low blood sugar).  11/22/12  Yes [provider]  insulin aspart (NOVOLOG) 100 UNIT/ML injection Inject 50 Units into the skin See admin instructions. Use with insulin pump daily 02/17/18  Yes [provider]  Insulin Human (INSULIN PUMP) SOLN Inject into the skin continuous. Novolog   Basal rate 0.29 units/ hour   Yes [provider]  labetalol (NORMODYNE) 300 MG tablet TAKE 1 TABLET(300 MG) BY MOUTH TWICE DAILY Patient taking differently: Take 300 mg by mouth daily.  02/20/20  Yes Adrian Prows, MD  losartan (COZAAR) 50 MG tablet Take 50 mg by mouth every evening.  05/11/20  Yes [provider]  rosuvastatin (CRESTOR) 10 MG tablet Take 10 mg by mouth at bedtime.    Yes [provider]     Consult time 70 minutes    Magdalen Spatz, MSN, AGACNP-BC [place] for personal pager PCCM on call  pager [phone] 06/01/2020 12:02 PM   Pulmonary critical care attending:  This is a 44 year old female multiple medical problems, type I diabetic since 44 years old, hypertension hyperlipidemia, end-stage renal disease on dialysis Tuesday Thursday Saturday. Also has history of severe TR diastolic heart failure. She presented with a new enlarged right-sided pleural effusion. She underwent thoracentesis with about 900 cc of fluid removed. Fluid analysis revealed elevated LDH and neutrophil predominance. No other additional labs to review.  She did have a pneumonia in July and was treated for hospitalization.  BP (!) 143/71 (BP Location: Left Arm)   Pulse 77   Temp 99.2 F (37.3 C) (Oral)   Resp 19   Ht 5\' 3"  (1.6 m)   Wt 52.5 kg   SpO2 93%   BMI 20.50 kg/m   General: Middle-aged female resting in bed no distress Heart: Regular rhythm W9-N9, systolic murmur left lower sternal border Lungs: Diminished in the right base in comparison to the left. Abdomen: Soft nontender nondistended Extremities: No significant edema  Pleural fluid studies reviewed.  Assessment: Right-sided pleural effusion Exudative by LDH criteria, ratio greater than 0.7 Neutrophil predominance 49% on fluid cell count She has multiple reasons for the development of pleural effusion including her end-stage renal disease status, small amount of ascites on imaging. ?liver disease   Plan: Plan to place small bore pigtail catheter for complete evacuation of the thorax. We will send for cytology, protein, albumin to complete albumin gradient. Once effusion is completely drained can consider repeat imaging of the chest  Thanks for the consultation  Garner Nash, DO [facility] 06/01/2020 1:39 PM

## 2020-06-02 ENCOUNTER — Inpatient Hospital Stay (HOSPITAL_COMMUNITY): Payer: Medicare HMO

## 2020-06-02 DIAGNOSIS — R188 Other ascites: Secondary | ICD-10-CM | POA: Diagnosis not present

## 2020-06-02 DIAGNOSIS — J9819 Other pulmonary collapse: Secondary | ICD-10-CM | POA: Diagnosis not present

## 2020-06-02 DIAGNOSIS — J948 Other specified pleural conditions: Secondary | ICD-10-CM | POA: Diagnosis not present

## 2020-06-02 DIAGNOSIS — J90 Pleural effusion, not elsewhere classified: Secondary | ICD-10-CM | POA: Diagnosis not present

## 2020-06-02 LAB — GLUCOSE, CAPILLARY
Glucose-Capillary: 211 mg/dL — ABNORMAL HIGH (ref 70–99)
Glucose-Capillary: 176 mg/dL — ABNORMAL HIGH (ref 70–99)
Glucose-Capillary: 134 mg/dL — ABNORMAL HIGH (ref 70–99)
Glucose-Capillary: 180 mg/dL — ABNORMAL HIGH (ref 70–99)
Glucose-Capillary: 74 mg/dL (ref 70–99)

## 2020-06-02 LAB — CBC
HCT: 35.9 % — ABNORMAL LOW (ref 36.0–46.0)
Hemoglobin: 11.3 g/dL — ABNORMAL LOW (ref 12.0–15.0)
MCH: 27.5 pg (ref 26.0–34.0)
MCHC: 31.5 g/dL (ref 30.0–36.0)
MCV: 87.3 fL (ref 80.0–100.0)
Platelets: 333 10*3/uL (ref 150–400)
RBC: 4.11 MIL/uL (ref 3.87–5.11)
RDW: 15.8 % — ABNORMAL HIGH (ref 11.5–15.5)
WBC: 7.5 10*3/uL (ref 4.0–10.5)
nRBC: 0 % (ref 0.0–0.2)

## 2020-06-02 LAB — BASIC METABOLIC PANEL
Anion gap: 15 (ref 5–15)
BUN: 33 mg/dL — ABNORMAL HIGH (ref 6–20)
CO2: 29 mmol/L (ref 22–32)
Calcium: 9.8 mg/dL (ref 8.9–10.3)
Chloride: 89 mmol/L — ABNORMAL LOW (ref 98–111)
Creatinine, Ser: 5.85 mg/dL — ABNORMAL HIGH (ref 0.44–1.00)
GFR, Estimated: 9 mL/min — ABNORMAL LOW (ref >60)
Glucose, Bld: 229 mg/dL — ABNORMAL HIGH (ref 70–99)
Potassium: 5 mmol/L (ref 3.5–5.1)
Sodium: 133 mmol/L — ABNORMAL LOW (ref 135–145)

## 2020-06-02 MED ORDER — SODIUM CHLORIDE 0.9% FLUSH
10.0000 mL | Freq: Three times a day (TID) | INTRAVENOUS | Status: DC
  Administered 2020-06-02: 10 mL

## 2020-06-02 MED ORDER — INSULIN ASPART 100 UNIT/ML ~~LOC~~ SOLN
0.0000 [IU] | SUBCUTANEOUS | Status: DC
  Administered 2020-06-02: 2 [IU] via SUBCUTANEOUS

## 2020-06-02 MED ORDER — DOXERCALCIFEROL 4 MCG/2ML IV SOLN
INTRAVENOUS | Status: AC
  Filled 2020-06-02: qty 2

## 2020-06-02 MED ORDER — CINACALCET HCL 30 MG PO TABS: 30.0000 mg | ORAL_TABLET | ORAL | Status: DC

## 2020-06-02 MED ORDER — INSULIN ASPART 100 UNIT/ML ~~LOC~~ SOLN
3.0000 [IU] | Freq: Three times a day (TID) | SUBCUTANEOUS | Status: DC
  Administered 2020-06-02 (×2): 3 [IU] via SUBCUTANEOUS

## 2020-06-02 MED ORDER — OXYCODONE HCL 5 MG PO TABS
5.0000 mg | ORAL_TABLET | Freq: Four times a day (QID) | ORAL | Status: DC | PRN
  Filled 2020-06-02: qty 1

## 2020-06-02 MED ORDER — LIDOCAINE 5 % EX PTCH: 1.0000 | MEDICATED_PATCH | CUTANEOUS | Status: DC

## 2020-06-02 MED ORDER — INSULIN ASPART 100 UNIT/ML ~~LOC~~ SOLN
0.0000 [IU] | Freq: Three times a day (TID) | SUBCUTANEOUS | Status: DC
  Administered 2020-06-02: 3 [IU] via SUBCUTANEOUS

## 2020-06-02 MED ORDER — INSULIN ASPART 100 UNIT/ML ~~LOC~~ SOLN
3.0000 [IU] | Freq: Once | SUBCUTANEOUS | Status: AC
  Administered 2020-06-02: 3 [IU] via SUBCUTANEOUS

## 2020-06-02 NOTE — Progress Notes (Addendum)
NAME:  [name], MRN:  [id number], DOB:  [birth year], LOS: 3 ADMISSION DATE:  05/30/2020, CONSULTATION DATE:  06/01/2020 REFERRING MD: Carmelina Noun , CHIEF COMPLAINT:  Pleural effusion , previously tapped  Brief History   Faith S Brooksis a 44 y.o.female never smoker presenting 11/10 with 1 week history of dyspnea with sats of 87% on RA.  PMH is significant for Type 1 DM, Hypertension, Hyperlipidemia, Ascites, ESRD on Dialysis TThS, andsevere tricuspid regurgitation, diastolic HF.  CXR was + for large right pleural effusion. SP Right  Thoracentesis 11/11, exudative , micro pending.  CT Chest with concern for Focal masslike consolidation along the right minor fissure may represent atelectasis/infiltrate.  PCCM consulted 11/12 re: possible etiologies/ follow up recommendations.   History of present illness   Faith Bessent Brooksis a 44 y.o.femalenever smoker presenting 05/30/2020 with 1 week history of dyspnea with oxygen sats of 87% on RA, . PMH is significant for Type 1 DM ( Diagnosed at 44 years old) , Hypertension, Hyperlipidemia, Ascites, ESRD on Dialysis TThS, andsevere tricuspid regurgitation, diastolic HF.CXR was + for large right pleural effusion.  CT Chest with concern for Focal masslike consolidation along the right minor fissure may represent atelectasis/infiltrate. No fever or leukocytosis. She is  SP right  Thoracentesis 11/11, 900 cc's of hazy amber fluid. Exudative per LDH criteria only. Neutrophil pre dominent.  Procedure was stopped due to patient's symptoms of cough and chest pain. Appeared  was exudative based on LDH criteria , micro is pending.There was concern for abdominal ascites as patient had abdominal distension. Abdominal U/S negative for ascites but showed chronic cholecystitis.  She states she did miss her scheduled HD treatment 11/6 as she was in the [place] on vacation and her flight was cancelled. She did go to HD 11/9. She states her fluid off each  treatment is 1.5- 2.5 L  Dry weight she thinks is 53 KG. She states she does not weigh daily, or watch salt intake.  Of note, patient was admitted in July 2021 for pneumonia. She states she was in the hospital x 1 week. She had Streptococcus Vestibularis in a blood Culture on 7/21. She was treated with ancef , with negative culture at discharge. PCCM consulted 11/12 re: possible etiologies/ recommendations.   Past Medical History   Past Medical History:  Diagnosis Date  . Anemia   . Arthritis   . Ascites 03/28/2020  . Diabetes mellitus   . Diabetic retinopathy ([place])    Hx of laser Rx's  . DM type 1 (diabetes mellitus, type 1) ([place]) 09/12/2014   Age of onset for DM type 1 was age 76.     . Enlarged thyroid gland   . ESRD on hemodialysis ([place])    ESRD due to DM type I, age of onset DM 1 was age 66.  Went on dialysis in March 2012.  Gets HD now at [facility] on a TTS schedule.    Marland Kitchen ESRD on hemodialysis ([place]) 09/12/2014   ESRD due to DM type 1.  Started HD in 2012 at [facility].  Gets HD there on a TTS schedule.    Marland Kitchen GERD (gastroesophageal reflux disease)   . History of blood transfusion   . Hyperlipidemia   . Hypertension   . Peripheral vascular disease ([place])   . Pneumonia     Significant Hospital Events   11/10>> Admission with large R effusion 11/12 PCCM consult  Consults:  11/12 PCCM 11/10 Renal  Procedures:  11/11  Right Thoracentesis >> 900 cc's hazy amber fluid Serum LDH 121 Pleural Fluid LDH 169 Ratio 0.7 Neutrophil predominant at 49%  Significant Diagnostic Tests:  CXR 11/10 Enlargement of cardiac silhouette with pulmonary vascular congestion and mild pulmonary edema. Large RIGHT pleural effusion with marked atelectasis of RIGHT lung. Minimal subsegmental atelectasis LEFT base.  CT Abdomen 11/11 Moderate size right pleural effusion with right lower lobe atelectasis/infiltrate. Focal masslike consolidation along the right minor fissure may represent  atelectasis/infiltrate. A mass is not excluded. Cardiomegaly with a small pericardial effusion. Small ascites and anasarca. Hepatomegaly. Cholelithiasis. Severe bilateral renal parenchyma atrophy and findings of renal osteodystrophy. No bowel obstruction. Normal appendix.  05/30/2020 US Abdomen Abnormal gallbladder with significant gallbladder wall thickening, calcifications in the wall, and sludge within the gallbladder. Favor chronic findings. Consider chronic cholecystitis. Negative sonographic Murphy's sign. 2. Moderate RIGHT pleural effusion ascites.  TEE 02/13/2020 EF:  60  to 65%. The left ventricle has normal function. The left ventricle has no  regional wall motion abnormalities. The left ventricular internal cavity  size was normal. Right Ventricle: The right ventricular size is severely enlarged.   Micro Data:  11/10 SARS Coronavirus 2 by RT PCR NEGATIVE NEGATIVE   11/10 Influenza A by PCR NEGATIVE NEGATIVE  NEGATIVE  NEGATIVE   Influenza B by PCR NEGATIVE NEGATIVE  NEGATIVE CM  NEGATIVE    11/11 Right Pleural Fluid GS>> No organism seen 11/11 Right Pkleaural Fluid for Culture>> Pending  Antimicrobials:  None   Interim history/subjective:  Patient seen sitting up at sink performing bath this morning.  States that she became nauseous with IV Dilaudid and requested p.o. oxycodone.  Denies any other acute complaints.  Did report insulin pump battery has died and is now utilizing sliding scale insulin coverage  Objective   Blood pressure 134/72, pulse 74, temperature 97.6 F (36.4 C), temperature source Oral, resp. rate 15, height 5\' 3"  (1.6 m), weight 52.5 kg, SpO2 94 %.        Intake/Output Summary (Last 24 hours) at 06/02/2020 0806 Last data filed at 06/01/2020 2300 Gross per 24 hour  Intake --  Output 950 ml  Net -950 ml   Filed Weights   05/31/20 0650 05/31/20 1040 06/01/20 1332  Weight: 54.2 kg 52.5 kg 52.5 kg    Examination: General: Thin adult  female sitting up in bedside chair in no acute distress HEENT: [place]/AT, MM pink/moist, PERRL,  Neuro: Flat but pleasant demeanor, alert and oriented x3, nonfocal CV: s1s2 regular rate and rhythm, no murmur, rubs, or gallops,  PULM: Faint bilateral basilar posterior crackles, no increased work of breathing GI: soft, bowel sounds active in all 4 quadrants, non-tender, non-distended Extremities: warm/dry, no edema  Skin: no rashes or lesions  Resolved Hospital Problem list     Assessment & Plan:  Large Right Pleural Effusion -Drained 11/11 by IR -Neutrophil predominant at 49%>> culture pending -CT Chest/ Abdomen / Pelvis  with right lower lobe Atelectasis/infiltrate. Focal masslike consolidation along the right minor fissure may represent atelectasis/infiltrate. A mass not excluded. Cardiomegaly with a small pericardial effusion Small ascites and anasarca. Hepatomegaly.>> LFT's WNL Hypoalbuminemia -Albumin 2.7  Plan: Pigtail chest tube drained approximately 1100 mL since insertion Chest x-ray this a.m. with evidence of  lung entrapment will pull chest tube today and ask for CTS surgery consult Follow pleural cytology Aggressive frequent pulmonary hygiene Mobilize as able, out of bed to chair Frequent use of incentive spirometer every hour when awake Trend WBC and fever curve Trend  bmet   Best practice:  Diet: Per Primary Pain/Anxiety/Delirium protocol (if indicated): Per Primary VAP protocol (if indicated): NA DVT prophylaxis: Per Primary GI prophylaxis: Per primary Glucose control: CBG/ SSI Mobility: OOB to chair Code Status: Full Family Communication: Pt.updated at bedside Disposition: Per Primary  Labs   CBC: Recent Labs  Lab 05/30/20 1352 05/31/20 0248 06/01/20 0158 06/02/20 0241  WBC 5.9 6.7 6.8 7.5  NEUTROABS 3.7  --   --   --   HGB 10.7* 10.2* 11.0* 11.3*  HCT 34.6* 32.4* 34.5* 35.9*  MCV 91.3 88.0 89.4 87.3  PLT 245 255 291 585    Basic Metabolic  Panel: Recent Labs  Lab 05/30/20 1352 05/31/20 0248 06/01/20 0158 06/01/20 1617 06/02/20 0241  NA 137 138 136 134* 133*  K 4.8 4.3 4.3 4.4 5.0  CL 92* 95* 94* 93* 89*  CO2 28 30 30 28 29   GLUCOSE 130* 156* 127* 112* 229*  BUN 22* 26* 20 27* 33*  CREATININE 5.39* 6.42* 4.27* 5.18* 5.85*  CALCIUM 9.3 9.4 9.2 9.2 9.8  PHOS  --  5.9* 4.4  --   --    GFR: Estimated Creatinine Clearance: 10.5 mL/min (A) (by C-G formula based on SCr of 5.85 mg/dL (H)). Recent Labs  Lab 05/30/20 1352 05/31/20 0248 06/01/20 0158 06/02/20 0241  WBC 5.9 6.7 6.8 7.5    Liver Function Tests: Recent Labs  Lab 05/30/20 1352 05/31/20 0248 06/01/20 0158 06/01/20 1617  AST 23 16  --  16  ALT 9 9  --  8  ALKPHOS 127* 131*  --  125  BILITOT 1.7* 1.3*  --  1.2  PROT 7.7 7.5  --  7.3  ALBUMIN 3.0* 2.8*  2.9* 2.7* 2.8*   No results for input(s): LIPASE, AMYLASE in the last 168 hours. No results for input(s): AMMONIA in the last 168 hours.  ABG    Component Value Date/Time   HCO3 10.4 (L) 10/06/2008 2235   TCO2 26 02/13/2020 1355   ACIDBASEDEF 16.8 (H) 10/06/2008 2235   O2SAT 51.9 10/06/2008 2235     Coagulation Profile: No results for input(s): INR, PROTIME in the last 168 hours.  Cardiac Enzymes: No results for input(s): CKTOTAL, CKMB, CKMBINDEX, TROPONINI in the last 168 hours.  HbA1C: Hgb A1c MFr Bld  Date/Time Value Ref Range Status  05/31/2020 02:48 AM 7.5 (H) 4.8 - 5.6 % Final    Comment:    (NOTE) Pre diabetes:          5.7%-6.4%  Diabetes:              >6.4%  Glycemic control for   <7.0% adults with diabetes   02/08/2020 11:55 PM 6.2 (H) 4.8 - 5.6 % Final    Comment:    (NOTE) Pre diabetes:          5.7%-6.4%  Diabetes:              >6.4%  Glycemic control for   <7.0% adults with diabetes     CBG: Recent Labs  Lab 06/01/20 1127 06/01/20 1605 06/01/20 2135 06/02/20 0204 06/02/20 0627  GLUCAP 76 112* [place]      Signature  Johnsie Cancel,  NP-C [facility] Contact / Pager information can be found on Amion  06/02/2020, 8:13 AM    PCCM Attending:  44 yo FM, large right sided effusion. ESRD, DM.  S/p pigtail placement with no re-expansion of the lung consistent with trapped  lung physiology   BP 115/60 (BP Location: Left Wrist)   Pulse 74   Temp 98.2 F (36.8 C) (Oral)   Resp 13   Ht 5\' 3"  (1.6 m)   Wt 52.5 kg   SpO2 96%   BMI 20.50 kg/m   Gen: young FM, resting in bed  Heart; RRR, s1 s2  Lungs: Right base diminished, absent breath sounds  Abd: soft nt   Pleural Fluid Albumin ratio of 1.3  CXR: serial films reviewed  Incomplete reexpansion of the right lung with pneumothorax exvacuo   A: Right sided effusion, TRANSUDATIVE She has a pseudoexudate by lights criteria, reclassified as transudate by albumin gradient. This is a chronic effusion and looks exudative only due to ongoing fluid shifts from dialysis  She has a trapped lung on the right   P:  She will need a non-urgent thoracic surgery evaluation for possible VATS  Pull chest tube today  The effusion will re-accumulate, this is expected   Garner Nash, DO [facility] 06/02/2020 10:17 AM

## 2020-06-02 NOTE — Progress Notes (Signed)
Interim progress note  Patient's nurse give [place] a call regarding patient's insulin orders.  Unfortunately the patient's insulin pump is run out of battery for this evening so we have transitioned her to long-acting insulin (Lantus 60 units once daily for basal coverage) and sensitive sliding scale insulin.  According to the nurse, the patient has her machine set to check her blood sugar every 4 hours and will administer insulin as needed.  Currently the patient's sensitive sliding scale insulin order is only set for with meals.  We decided to change the sliding scale insulin timing to every 4 hours to match how often the patient usually has her blood sugars checked, so that we are not straining from her home regimen.  Nurse agrees with plan and also acknowledges order for Lantus 6 units to be administered at 8 AM with breakfast.  Additionally, the nurse reports that the patient is feeling better and is doing well at this time.   Milus Banister, [place], PGY-3 06/02/2020 2:21 AM

## 2020-06-02 NOTE — Progress Notes (Signed)
Family Medicine Teaching Service Daily Progress Note Intern Pager: [phone]  Patient name: [name] Medical record number: [id number] Date of birth: Jun 12, 1976 Age: 44 y.o. Gender: female  Primary Care Provider: Trey Sailors, PA Consultants: Fredric Mare Code Status: Full  Pt Overview and Major Events to Date:  Admitted 11/10  Assessment and Plan: Faith Stegemann Brooksis a 44 y.o.femalepresenting with . PMH is significant for Type 1 DM, Hypertension, Hyperlipidemia, Ascites, ESRD on Dialysis TThS, andsevere tricuspid regurgitation.   Dyspnea / Pleural Effusion Patient had chest tube placed yesterday by CCM.  Reclassified as transudate by albumin gradient. Patient is experiencing a good deal of pain secondary to the chest tube. Patient started on Dilaudid q4hr PRN but is making nauseous.  - Pulm following, appreciate recs - Pulm indicates she will need a non-urgent thoracic surgery evaluation for possible VATS, pull chest tube today  - Continuous Pulse Oximetry, O2 PRN to keep sats >92%, Vital Signs q4hr - D/C Dilaudid, start Oxycodone 5 mg q4hr PRN    Type 1 Diabetes Mellitis  Insulin pump died last night.06/03/2023 A1C- 7.5.   - Continue Insulin pump, pharmacy consulted -CBG's with meals  Tricuspid Regurgitation Seen on TEE in 02/13/20. Has appt with Cardiology in January. - No further management  Abdominal Distension Obtained Abdominal Ultrasound. Showed no significant Ascites or evidence of Cirrhosis. Did identify Chronic Cholecystitis. ALT AST normal. Alk Phos- 131. No abdominal tenderness to deep palapation. - Will hold off on consulting GI - Can hold Miralax while patient on wall  Hyperlipidemia Patient on rosuvastatin 10 mg at home -Continuehome rosuvastatin  Anemia, normocytic Hemoglobin 11.2, at baseline. -AM CBC   FEN/GI:Carb controlled/renal Prophylaxis:Heparin   Status is: Inpatient  Remains inpatient appropriate  because:Inpatient level of care appropriate due to severity of illness   Dispo:  Patient From: Home  Planned Disposition: Home  Expected discharge date: 06/03/20  Medically stable for discharge: No   Subjective:  Patient indicates her pain is well controlled with pain meds, but Dilaudid is making nauseas and asking for PO Oxycodone.  Indicates she is otherwise fine and is asking to hold off on Miralax while on wall.    Objective: Temp:  [97.6 F (36.4 C)-99.2 F (37.3 C)] 98.2 F (36.8 C) (11/13 0700) Pulse Rate:  [73-77] 74 (11/13 0700) Resp:  [13-22] 13 (11/13 0700) BP: (113-143)/(53-73) 115/60 (11/13 0700) SpO2:  [93 %-100 %] 96 % (11/13 0700) Weight:  [52.5 kg] 52.5 kg (11/12 1332) Physical Exam:  Physical Exam Constitutional:      General: She is not in acute distress.    Appearance: She is not ill-appearing.  HENT:     Head: Normocephalic and atraumatic.  Cardiovascular:     Rate and Rhythm: Normal rate and regular rhythm.  Pulmonary:     Effort: Pulmonary effort is normal.     Breath sounds: Examination of the right-upper field reveals decreased breath sounds. Examination of the right-middle field reveals decreased breath sounds. Examination of the right-lower field reveals decreased breath sounds. Decreased breath sounds present. No wheezing, rhonchi or rales.  Abdominal:     General: There is distension.     Palpations: Abdomen is soft.     Tenderness: There is no abdominal tenderness.  Neurological:     Mental Status: She is alert.    Laboratory: Recent Labs  Lab June 02, 2020 0248 06/01/20 0158 06/02/20 0241  WBC 6.7 6.8 7.5  HGB 10.2* 11.0* 11.3*  HCT 32.4* 34.5* 35.9*  PLT 255 291  333   Recent Labs  Lab 05/30/20 1352 05/30/20 1352 05/31/20 0248 05/31/20 0248 06/01/20 0158 06/01/20 1617 06/02/20 0241  NA 137   < > 138   < > 136 134* 133*  K 4.8   < > 4.3   < > 4.3 4.4 5.0  CL 92*   < > 95*   < > 94* 93* 89*  CO2 28   < > 30   < > 30 28 29    BUN 22*   < > 26*   < > 20 27* 33*  CREATININE 5.39*   < > 6.42*   < > 4.27* 5.18* 5.85*  CALCIUM 9.3   < > 9.4   < > 9.2 9.2 9.8  PROT 7.7  --  7.5  --   --  7.3  --   BILITOT 1.7*  --  1.3*  --   --  1.2  --   ALKPHOS 127*  --  131*  --   --  125  --   ALT 9  --  9  --   --  8  --   AST 23  --  16  --   --  16  --   GLUCOSE 130*   < > 156*   < > 127* 112* 229*   < > = values in this interval not displayed.    Imaging/Diagnostic Tests:  EXAM: PORTABLE CHEST 1 VIEW  COMPARISON:  November 12 21  FINDINGS: Chest tube position unchanged. Small right apical pneumothorax persists. Small right pleural effusion persists as well. There is ill-defined airspace opacity in both lower lung regions, likely due primarily to atelectasis. Heart is enlarged with pulmonary vascularity normal. No adenopathy. Postoperative change noted in the left axillary region.  IMPRESSION: Chest tube position on the right unchanged. Small right pleural effusion and small right apical pneumothorax stable. Atelectatic change in both lower lung regions. Stable cardiac prominence.  Delora Fuel, MD 06/02/2020, 11:31 AM PGY-1, [place] Intern pager: [phone], text pages welcome

## 2020-06-02 NOTE — Progress Notes (Signed)
[facility] Progress Note   Subjective:   Patient seen and examined in room.  Sitting up at sink about to bathe.  Reports pain around chest tube but otherwise doing ok.  Breathing improved.  Reports abdominal distention is slightly improved today. Nurse reported she refused Sensipar this AM.  Per orders it is to be given after dialysis with food but time is set for 9AM, will change time to 7PM.  Objective Vitals:   06/01/20 1843 06/01/20 2112 06/01/20 2353 06/02/20 0700  BP: (!) 113/55 131/66 134/72 115/60  Pulse: 75 73 74 74  Resp: 17 19 15 13   Temp: 98.5 F (36.9 C) 97.7 F (36.5 C) 97.6 F (36.4 C) 98.2 F (36.8 C)  TempSrc: Oral Oral Oral Oral  SpO2: 97% 98% 94% 96%  Weight:      Height:       Physical Exam General:WDWN, thin female in NAD Heart:RRR Lungs:decreased breath sounds on R, chest tube on R c/d/i, nml WOB Abdomen:soft, mildly distended Extremities:no LE edema Dialysis Access: RU AVF +b/t   Filed Weights   05/31/20 0650 05/31/20 1040 06/01/20 1332  Weight: 54.2 kg 52.5 kg 52.5 kg    Intake/Output Summary (Last 24 hours) at 06/02/2020 0936 Last data filed at 06/02/2020 0902 Gross per 24 hour  Intake 10 ml  Output 950 ml  Net -940 ml    Additional Objective Labs: Basic Metabolic Panel: Recent Labs  Lab 05/31/20 0248 05/31/20 0248 06/01/20 0158 06/01/20 1617 06/02/20 0241  NA 138   < > 136 134* 133*  K 4.3   < > 4.3 4.4 5.0  CL 95*   < > 94* 93* 89*  CO2 30   < > 30 28 29   GLUCOSE 156*   < > 127* 112* 229*  BUN 26*   < > 20 27* 33*  CREATININE 6.42*   < > 4.27* 5.18* 5.85*  CALCIUM 9.4   < > 9.2 9.2 9.8  PHOS 5.9*  --  4.4  --   --    < > = values in this interval not displayed.   Liver Function Tests: Recent Labs  Lab 05/30/20 1352 05/30/20 1352 05/31/20 0248 06/01/20 0158 06/01/20 1617  AST 23  --  16  --  16  ALT 9  --  9  --  8  ALKPHOS 127*  --  131*  --  125  BILITOT 1.7*  --  1.3*  --  1.2  PROT 7.7  --  7.5   --  7.3  ALBUMIN 3.0*   < > 2.8*  2.9* 2.7* 2.8*   < > = values in this interval not displayed.   CBC: Recent Labs  Lab 05/30/20 1352 05/30/20 1352 05/31/20 0248 06/01/20 0158 06/02/20 0241  WBC 5.9   < > 6.7 6.8 7.5  NEUTROABS 3.7  --   --   --   --   HGB 10.7*   < > 10.2* 11.0* 11.3*  HCT 34.6*   < > 32.4* 34.5* 35.9*  MCV 91.3  --  88.0 89.4 87.3  PLT 245   < > 255 291 333   < > = values in this interval not displayed.   Blood Culture    Component Value Date/Time   SDES PLEURAL FLUID 05/31/2020 1205   SDES PLEURAL FLUID 05/31/2020 1205   SPECREQUEST RIGHT THORACENTESIS 05/31/2020 [place] THORACENTESIS 05/31/2020 1205   CULT  05/31/2020 1205    NO  GROWTH < 24 HOURS Performed at [facility], [place] [address], [place], [place] [address]    REPTSTATUS 05/31/2020 FINAL 05/31/2020 1205   REPTSTATUS PENDING 05/31/2020 1205   CBG: Recent Labs  Lab 06/01/20 1605 06/01/20 2135 06/02/20 0204 06/02/20 0627 06/02/20 0826  GLUCAP 112* 129* 211* 176* 134*   Studies/Results: DG Chest 1 View  Result Date: 05/31/2020 CLINICAL DATA:  Status post right-sided thoracentesis EXAM: CHEST  1 VIEW COMPARISON:  One day prior FINDINGS: Left axillary surgical clips. Midline trachea. Mild cardiomegaly. Decrease in small right pleural effusion. No pneumothorax. Mild interstitial edema, asymmetric right. Improved right sided aeration with lower lung airspace disease remaining. IMPRESSION: No pneumothorax after right-sided thoracentesis. Mild congestive heart failure with residual small right pleural effusion and adjacent airspace disease. Electronically Signed   By: Abigail Miyamoto M.D.   On: 05/31/2020 12:02   CT CHEST ABDOMEN PELVIS W CONTRAST  Result Date: 05/31/2020 CLINICAL DATA:  44 year old female with pleuritic chest pain and shortness of breath. Concern for malignancy. EXAM: CT CHEST, ABDOMEN, AND PELVIS WITH CONTRAST TECHNIQUE: Multidetector CT imaging of the  chest, abdomen and pelvis was performed following the standard protocol during bolus administration of intravenous contrast. CONTRAST:  14mL OMNIPAQUE IOHEXOL 300 MG/ML  SOLN COMPARISON:  CT abdomen pelvis dated 12/25/2014. FINDINGS: CT CHEST FINDINGS Cardiovascular: There is mild cardiomegaly. There is a small pericardial effusion measuring approximately 1 cm in thickness anterior to the heart. The thoracic aorta is unremarkable. The origins of the great vessels of the aortic arch appear patent as visualized. The central pulmonary arteries are patent. Mediastinum/Nodes: No definite hilar adenopathy. Evaluation of the right hilum however is limited due to consolidative changes of the adjacent lung. Subcarinal adenopathy measure up to 19 mm in short axis. The esophagus is grossly unremarkable. The thyroid gland is heterogeneous likely containing multiple nodules. Recommend thyroid US (ref: J Am Coll Radiol. 2015 Feb;12(2): 143-50).There is mild diffuse mediastinal edema. Lungs/Pleura: There is a moderate right pleural effusion. There is consolidative changes of the majority of the right lung which may represent atelectasis or infiltrate. There are scattered clusters of ground-glass opacity in the right upper lobe and right middle lobe which may represent atelectasis or infiltrate. There is a 2.2 x 3.4 cm focal consolidation along the right minor fissure (24/3), possibly atelectasis. A mass is not excluded. There is a trace left pleural effusion and minimal left lung base atelectasis. Several small scattered pneumatoceles noted. There is no pneumothorax. The central airways are patent. Musculoskeletal: There is osteosclerosis consistent with renal osteodystrophy. No acute osseous pathology. CT ABDOMEN PELVIS FINDINGS No intra-abdominal free air. Small ascites. Hepatobiliary: The liver is enlarged measuring 19 cm in midclavicular length. No intrahepatic biliary ductal dilatation. There is calcified stone within the  gallbladder versus calcified gallbladder wall. Pancreas: Unremarkable. No pancreatic ductal dilatation or surrounding inflammatory changes. Spleen: Normal in size without focal abnormality. Adrenals/Urinary Tract: The adrenal glands unremarkable. Severe bilateral renal parenchyma atrophy. Several small hypodense renal lesions are not characterized on this CT. There is no hydronephrosis or nephrolithiasis on either side. The urinary bladder is collapsed. Stomach/Bowel: The stomach is distended with ingested content. There is no bowel obstruction. The appendix is unremarkable as visualized. Vascular/Lymphatic: The abdominal aorta and IVC are unremarkable. No portal venous gas. No definite adenopathy. Reproductive: The uterus and ovaries are grossly unremarkable. Other: There is diffuse subcutaneous edema and anasarca. Musculoskeletal: Diffuse osseous sclerosis in keeping with renal osteodystrophy. No acute osseous pathology. IMPRESSION: 1. Moderate  size right pleural effusion with right lower lobe atelectasis/infiltrate. Focal masslike consolidation along the right minor fissure may represent atelectasis/infiltrate. A mass is not excluded. 2. Cardiomegaly with a small pericardial effusion. 3. Small ascites and anasarca. 4. Hepatomegaly. 5. Cholelithiasis. 6. Severe bilateral renal parenchyma atrophy and findings of renal osteodystrophy. 7. No bowel obstruction. Normal appendix. Electronically Signed   By: Anner Crete M.D.   On: 05/31/2020 21:26   DG CHEST PORT 1 VIEW  Result Date: 06/02/2020 CLINICAL DATA:  Chest tube placement with known right apical pneumothorax EXAM: PORTABLE CHEST 1 VIEW COMPARISON:  November 12 21 FINDINGS: Chest tube position unchanged. Small right apical pneumothorax persists. Small right pleural effusion persists as well. There is ill-defined airspace opacity in both lower lung regions, likely due primarily to atelectasis. Heart is enlarged with pulmonary vascularity normal. No  adenopathy. Postoperative change noted in the left axillary region. IMPRESSION: Chest tube position on the right unchanged. Small right pleural effusion and small right apical pneumothorax stable. Atelectatic change in both lower lung regions. Stable cardiac prominence. Electronically Signed   By: Lowella Grip III M.D.   On: 06/02/2020 08:00   DG CHEST PORT 1 VIEW  Result Date: 06/01/2020 CLINICAL DATA:  44 year old female with a history of chest tube placement EXAM: PORTABLE CHEST 1 VIEW COMPARISON:  06/01/2020, 05/31/2020 FINDINGS: Cardiomediastinal silhouette unchanged in size and contour. Unchanged right-sided pigtail thoracostomy tube, terminating in the sub pulmonic space. Redemonstration of ex vacuo hydropneumothorax, with improved volume of pleural fluid compared to the preoperative chest x-ray. Similar appearance of mixed reticulonodular opacities of the lungs. IMPRESSION: Unchanged position of right thoracostomy tube in the sub pulmonic space with similar appearance of the ex vacuo hydropneumothorax. Similar appearance of mixed reticulonodular opacities of the bilateral lungs. Electronically Signed   By: Corrie Mckusick D.O.   On: 06/01/2020 16:46   DG CHEST PORT 1 VIEW  Result Date: 06/01/2020 CLINICAL DATA:  Pleural effusion. EXAM: PORTABLE CHEST 1 VIEW COMPARISON:  CT chest and chest x-ray from yesterday. FINDINGS: New right-sided chest tube with resolved right-sided pleural effusion. Small right pneumothorax ex vacuo is present due to incomplete expansion of the right middle and lower lobes. Patchy consolidation in the right upper lobe, right middle lobe, and right lower lobe is not significantly changed. Left lung is clear. No acute osseous abnormality. IMPRESSION: 1. Resolved right pleural effusion status post chest tube placement. Small right pneumothorax ex vacuo is present due to incomplete expansion of the right middle and lower lobes. 2. Unchanged multifocal right lung pneumonia.  Electronically Signed   By: Titus Dubin M.D.   On: 06/01/2020 15:01   IR THORACENTESIS ASP PLEURAL SPACE W/IMG GUIDE  Result Date: 05/31/2020 INDICATION: Patient with history of ESRD, dyspnea, and right pleural effusion. Request made for diagnostic and therapeutic left thoracentesis. EXAM: ULTRASOUND GUIDED DIAGNOSTIC AND THERAPEUTIC RIGHT THORACENTESIS MEDICATIONS: 10 mL 1% lidocaine COMPLICATIONS: None immediate. PROCEDURE: An ultrasound guided thoracentesis was thoroughly discussed with the patient and questions answered. The benefits, risks, alternatives and complications were also discussed. The patient understands and wishes to proceed with the procedure. Written consent was obtained. Ultrasound was performed to localize and mark an adequate pocket of fluid in the right chest. The area was then prepped and draped in the normal sterile fashion. 1% Lidocaine was used for local anesthesia. Under ultrasound guidance a 6 Fr Safe-T-Centesis catheter was introduced. Thoracentesis was performed. The catheter was removed and a dressing applied. FINDINGS: A total of approximately 900 mL  of hazy amber fluid was removed. Procedure was stopped after 900 mL secondary patient's symptoms (coughing, chest pain). Samples were sent to the laboratory as requested by the clinical team. IMPRESSION: Successful ultrasound guided right thoracentesis yielding 900 mL of pleural fluid. Read by: Earley Abide, PA-C Electronically Signed   By: Ruthann Cancer MD   On: 05/31/2020 12:15    Medications:  . amLODipine  10 mg Oral QHS  . calcium acetate  667 mg Oral TID WC  . Chlorhexidine Gluconate Cloth  6 each Topical Q0600  . [START ON 06/05/2020] cinacalcet  30 mg Oral Once per day on Tue Thu Sat  . doxercalciferol  2 mcg Intravenous Q T,Th,Sa-HD  . heparin  5,000 Units Subcutaneous Q8H  . insulin aspart  0-15 Units Subcutaneous TID WC  . insulin aspart  3 Units Subcutaneous TID WC  . insulin glargine  6 Units  Subcutaneous Q breakfast  . labetalol  300 mg Oral QHS  . lidocaine  1 patch Transdermal Q24H  . losartan  50 mg Oral QPM  . polyethylene glycol  17 g Oral Daily  . rosuvastatin  10 mg Oral QHS  . sodium chloride flush  10 mL Intracatheter Q8H    Dialysis Orders: [facility] TTS 425 / 800, 2K 2.25 Ca, EDW 53, 3:30, AVF last tx 11/9 - post wt 52.7, post BP 152/75 runs full tx venofer 50 weekly 11/4 Hb 11.9, no ESA; Phos 7.1, Ca 9.6, PTH 04/2020 445 hectorol 2 TIW, sensipar 30 TIW  Assessment/Plan: 1. R pleural effusion - s/p thoracentesis by IR 0.9L removed on 11/11.  Appears exudative. Cultures pending. CT post procedure showed moderate R pleural effusion and mass like consolidation on R minor fissure. Chest tube inserted 11/12 with ~1184mL fluid drained since insertion. Per PCCM plan to pull tube today due to lung entrapment and CTS consult.  2. Hypoxia - ongoing O2 requirement. 3. Abdominal distention - no ascites on [place], but did note chronic cholecystitis.  CT chest/abd on 11/11 showed small ascites and anasarca. Wkup per primary.  2. ESRD - on HD TTS.  HD today per regular schedule. K 5.0 this AM. 3. Anemia of CKD- Hgb 11.3. No indication for ESA 4. Secondary hyperparathyroidism - Ca and phos in goal. Continue VDRA and binders.  Changed Sensipar to PM dosing so it will be after dialysis as she normally takes it.  5. HTN/volume - BP well controlled.  Continue home meds.  Net UF 1.5L removed 11/11. A little under dry weight, will challenge weight tomorrow since anasarca noted on CT.  Net UF goal 2L as tolerated.  6. Nutrition - Renal diet w/fluid restrictions. Alb 2.7. +protein supplements.  Jen Mow, PA-C [facility] 06/02/2020,9:36 AM  LOS: 3 days

## 2020-06-02 NOTE — Plan of Care (Signed)
  Problem: Clinical Measurements: Goal: Respiratory complications will improve Outcome: Progressing   Problem: Nutrition: Goal: Adequate nutrition will be maintained Outcome: Progressing   Problem: Coping: Goal: Level of anxiety will decrease Outcome: Progressing   

## 2020-06-03 DIAGNOSIS — R0600 Dyspnea, unspecified: Secondary | ICD-10-CM | POA: Diagnosis not present

## 2020-06-03 DIAGNOSIS — J90 Pleural effusion, not elsewhere classified: Secondary | ICD-10-CM | POA: Diagnosis not present

## 2020-06-03 DIAGNOSIS — R0902 Hypoxemia: Secondary | ICD-10-CM | POA: Diagnosis not present

## 2020-06-03 DIAGNOSIS — R0689 Other abnormalities of breathing: Secondary | ICD-10-CM | POA: Diagnosis not present

## 2020-06-03 LAB — CBC
HCT: 32.1 % — ABNORMAL LOW (ref 36.0–46.0)
Hemoglobin: 10.2 g/dL — ABNORMAL LOW (ref 12.0–15.0)
MCH: 28.2 pg (ref 26.0–34.0)
MCHC: 31.8 g/dL (ref 30.0–36.0)
MCV: 88.7 fL (ref 80.0–100.0)
Platelets: 325 10*3/uL (ref 150–400)
RBC: 3.62 MIL/uL — ABNORMAL LOW (ref 3.87–5.11)
RDW: 15.9 % — ABNORMAL HIGH (ref 11.5–15.5)
WBC: 6.2 10*3/uL (ref 4.0–10.5)
nRBC: 0 % (ref 0.0–0.2)

## 2020-06-03 LAB — GLUCOSE, CAPILLARY
Glucose-Capillary: 211 mg/dL — ABNORMAL HIGH (ref 70–99)
Glucose-Capillary: 127 mg/dL — ABNORMAL HIGH (ref 70–99)

## 2020-06-03 LAB — RENAL FUNCTION PANEL
Albumin: 2.6 g/dL — ABNORMAL LOW (ref 3.5–5.0)
Anion gap: 12 (ref 5–15)
BUN: 14 mg/dL (ref 6–20)
CO2: 29 mmol/L (ref 22–32)
Calcium: 8.9 mg/dL (ref 8.9–10.3)
Chloride: 93 mmol/L — ABNORMAL LOW (ref 98–111)
Creatinine, Ser: 3.39 mg/dL — ABNORMAL HIGH (ref 0.44–1.00)
GFR, Estimated: 17 mL/min — ABNORMAL LOW (ref >60)
Glucose, Bld: 198 mg/dL — ABNORMAL HIGH (ref 70–99)
Phosphorus: 3.6 mg/dL (ref 2.5–4.6)
Potassium: 4.3 mmol/L (ref 3.5–5.1)
Sodium: 134 mmol/L — ABNORMAL LOW (ref 135–145)

## 2020-06-03 LAB — TRIGLYCERIDES, BODY FLUIDS: Triglycerides, Fluid: <9 mg/dL

## 2020-06-03 MED ORDER — INSULIN ASPART 100 UNIT/ML ~~LOC~~ SOLN: 2.0000 [IU] | Freq: Three times a day (TID) | SUBCUTANEOUS | Status: DC

## 2020-06-03 MED ORDER — INSULIN ASPART 100 UNIT/ML ~~LOC~~ SOLN
0.0000 [IU] | Freq: Three times a day (TID) | SUBCUTANEOUS | Status: DC
  Administered 2020-06-03: 5 [IU] via SUBCUTANEOUS

## 2020-06-03 MED ORDER — INSULIN ASPART 100 UNIT/ML ~~LOC~~ SOLN: 0.0000 [IU] | SUBCUTANEOUS | Status: DC

## 2020-06-03 MED ORDER — INSULIN ASPART 100 UNIT/ML ~~LOC~~ SOLN
3.0000 [IU] | Freq: Once | SUBCUTANEOUS | Status: AC
  Administered 2020-06-03: 3 [IU] via SUBCUTANEOUS

## 2020-06-03 NOTE — Progress Notes (Signed)
Occupational Therapy Treatment Patient Details Name: [name] MRN: [id number] DOB: 1976-05-10 Today's Date: 06/03/2020    History of present illness Pt is a 44 year old woman admitted with difficulty breathing on 11/10. Underwent thoracentesis on 05/31/20. PMH: type 1 diabetes, ESRD, HTN, ascites, severe tricuspid regurgitation.   OT comments  Pt. Seen for skilled OT treatment session.  Energy conservation handouts provided and reviewed with pt. Answered all questions and provided examples and additional strategies to manage b/d (her reported most difficult tasks to complete).  Reviewed DME needs for bathing.  Pt. States she plans on getting a tub bench.  Provided hand out for DME examples as well.  Reviewed design of her current bathroom and determined she has a  L faucet.  Reviewed this is important for type of bench to getl (if it has permanent attached handle vs. One that can switch ends). Pt. Had no further questions.  Clear for d/c from acute OT.  Will alert OTR/L goals met and clear to sign off.      Follow Up Recommendations  No OT follow up    Equipment Recommendations       Recommendations for Other Services      Precautions / Restrictions Precautions Precaution Comments: watch 02       Mobility Bed Mobility                  Transfers                      Balance                                           ADL either performed or assessed with clinical judgement   ADL                                         General ADL Comments: Met wtih pt. and provided and reviewed at length energy conservation handouts. provided examples of implementation along with addtional examples for integration into her everyday.  pt. also able to provided insight into which tasks create more SOB and fatigue ie: bathing/dressing.  reivewed options and modifications for these tasks.  answered all questions.  reviewed bathing dme  options. pt. adamant that she does not want 3n1 and would not use it as a shower chair. states a reg. shower chair will also not work due to size of tub.  sketched out pts. b.room set up and determined she has L faucet.  reviewed hand out to show how the handle would be on the inside portion of the bench.  she verbalized understanding and thanked me for the handouts, education, and review.     Vision       Perception     Praxis      Cognition Arousal/Alertness: Awake/alert Behavior During Therapy: WFL for tasks assessed/performed Overall Cognitive Status: Within Functional Limits for tasks assessed                                          Exercises     Shoulder Instructions       General Comments      Pertinent  Vitals/ Pain       Pain Assessment: No/denies pain  Home Living                                          Prior Functioning/Environment              Frequency  Min 1X/week        Progress Toward Goals  OT Goals(current goals can now be found in the care plan section)  Progress towards OT goals: Progressing toward goals     Plan All goals met and education completed, patient discharged from OT services    Co-evaluation                 AM-PAC OT "6 Clicks" Daily Activity     Outcome Measure   Help from another person eating meals?: None Help from another person taking care of personal grooming?: None Help from another person toileting, which includes using toliet, bedpan, or urinal?: None Help from another person bathing (including washing, rinsing, drying)?: None Help from another person to put on and taking off regular upper body clothing?: None Help from another person to put on and taking off regular lower body clothing?: None 6 Click Score: 24    End of Session Equipment Utilized During Treatment: Oxygen  OT Visit Diagnosis: Other (comment)   Activity Tolerance Patient tolerated treatment well    Patient Left in chair;with call bell/phone within reach   Nurse Communication          Time: [phone] OT Time Calculation (min): 9 min  Charges: OT General Charges $OT Visit: 1 Visit OT Treatments $Self Care/Home Management : 8-22 mins  Sonia Baller, COTA/L Acute Rehabilitation [phone]   Janice Coffin 06/03/2020, 11:13 AM

## 2020-06-03 NOTE — Progress Notes (Signed)
Pt/family given discharge instructions, medication lists, follow up appointments, and when to call the doctor.  Pt/family verbalizes understanding. Patient wearing home oxygen. Transported to main entrance via wheelchair for security to give valet keys and take to car.  All questions answered. Payton Emerald, RN

## 2020-06-03 NOTE — Progress Notes (Signed)
SATURATION QUALIFICATIONS: (This note is used to comply with regulatory documentation for home oxygen)  Patient Saturations on Room Air at Rest = 85%  Patient Saturations on Room Air while Ambulating = 85%  Patient Saturations on 2 Liters of oxygen while Ambulating = 95%  Please briefly explain why patient needs home oxygen: Pt 95-100 at rest but drops to mid 80's (85) with limited mobility (stood up to put trash in can). Replaced oxygen at 2 liters and saturations returned 95-100%

## 2020-06-03 NOTE — TOC Transition Note (Signed)
Transition of Care (TOC) - CM/SW Discharge Note Marvetta Gibbons RN, BSN Transitions of Care Unit 4E- RN Case Manager See Treatment Team for direct phone #    Patient Details  Name: [name] MRN: [id number] Date of Birth: 02-26-1976  Transition of Care [facility]) CM/SW Contact:  Dawayne Patricia, RN Phone Number: 06/03/2020, 4:19 PM   Clinical Narrative:    Pt stable for transition home today, order placed for home 02, - call made to [employer] who already had referral for home 02 needs and was just waiting for order to be corrected. - once processed- they will deliver portable tank to room for transport home.  CM spoke with pt at bedside regarding tub bench- per conversation pt will plan to go to store and look at options to be sure it will work in her tub/shower- provided pt with printed order for her to use as Medicaid will cover cost for tub/shower chairs.  Pt reports she has transportation home.    Final next level of care: Home/Self Care Barriers to Discharge: No Barriers Identified   Patient Goals and CMS Choice Patient states their goals for this hospitalization and ongoing recovery are:: to return to home CMS Medicare.gov Compare Post Acute Care list provided to:: Patient    Discharge Placement               Home        Discharge Plan and Services   Discharge Planning Services: CM Consult Post Acute Care Choice: Durable Medical Equipment          DME Arranged: Oxygen DME Agency: [employer] Date DME Agency Contacted: 06/03/20 Time DME Agency Contacted: [phone] Representative spoke with at DME Agency: [place]: NA [place] Agency: NA        Social Determinants of Health ([place]) Interventions     Readmission Risk Interventions Readmission Risk Prevention Plan 06/03/2020  Transportation Screening Complete  PCP or Specialist Appt within 5-7 Days Complete  Medication Review (RN CM) Complete  Some recent data might be hidden

## 2020-06-03 NOTE — Progress Notes (Signed)
Hypoglycemic Event  CBG: 49  Treatment: 8 oz juice/soda  Symptoms: None  Follow-up CBG: [id number] CBG Result:93  Possible Reasons for Event: Medication regimen: pt states she was given extra insulin early in morning while her home monitor had alarmed. Pt given 8 ounces orange juice and 2 pieces hard candy per request.  Comments/MD notified:paged    Payton Emerald

## 2020-06-03 NOTE — Progress Notes (Signed)
Family Medicine Teaching Service Daily Progress Note Intern Pager: [phone]  Patient name: [name] Medical record number: [id number] Date of birth: 07/22/1975 Age: 44 y.o. Gender: female  Primary Care Provider: Trey Sailors, PA Consultants: [name] Code Status: FULL   Pt Overview and Major Events to Date:  Admitted 11/10   Assessment and Plan:  Faith Radich Brooksis a 44 y.o.femalepresenting with dyspnea. PMH is significant for Type 1 DM, Hypertension, Hyperlipidemia, Ascites, ESRD on Dialysis TThS, andsevere tricuspid regurgitation.  Hypoxemic respiratory failure 2/2 Pleural Effusion Sats 98% on 2L. Patient reports no dyspnea, chest pain or dizziness.  Has had Tylenol once overnight but no oxycodone. On exam no obvious respiratory distress, reduced breath sounds right mid to lower lobe. Chest tube incision site clean and dry. S/p thoracentesis 11/12 by Pulmonology. Chest tube pulled on 11/13.  Pleural effusion initially appeared to be exudative but reclassified as transudate by albumin gradient.  - Pulmology following, appreciate recs -Continuous pulse ox -Sats >92-94% -Wean oxygen supplementation as able -Ambulate with pulse ox -Serial CXRs -Per pulmonology will need non-urgent thoracic surgery evaluation for possible VATS -May require oxygen at home while waiting for thoracic surgery -Monitor pain -Continue Oxycodone 5 mg q4hr PRN  -Continue Tylenol $RemoveBeforeDEI'650mg'[id number]$  Q6PRN   ESRD, treating Cr 3.39 today -Nephrology following, appreciate recs -Continue HD TTS -Daily renal function panel -Avoid nephrotoxic agents   Type 1 Diabetes Mellitis, treating A1 C on admission7.5. CBGs 74,180, 267.  Required 3 units NovoLog overnight due to hyperglycemia.  She reports her CBGs have never been this high and her Dexcom readings >300 and higher than the hospital readings. She would like to go home to get her insulin for the pump. Home medication includes: Insulin pump by  Dexcom -NovoLog 3 units 3 times daily -Lantus 6 units daily -CBG's with meals  Tricuspid Regurgitation, stable Seen on TEE in 02/13/20. Has appt with Cardiology in January. - No further management  Abdominal distension, stable Obtained Abdominal Ultrasound which showed no significant ascites or evidence of cirrhosis.  It did show chroniccholecystitis. ALT AST normal. Alk Phos 131 on 11/12 -Consider referral to GEN surge for lap chole as an outpatient  Hyperlipidemia, stable Home meds: rosuvastatin 10 mg -Continuehome rosuvastatin daily   Anemia, normocytic, stable Hemoglobin 10.2 today. Likely due to chronic disease  -Continue to monitor hemoglobin with CBC  FEN/GI:Carb controlled/renal Prophylaxis:Heparin  Status is: Inpatient  Subjective:  Denies dyspnea or chest pain.  Patient would like to go home as her CBGs are very high and she has run out of insulin for her insulin pump.  She also reports that she may need to the VATS procedure as an outpatient she does not want to stay in hospital until then.   Objective: Temp:  [97.9 F (36.6 C)-98.6 F (37 C)] 97.9 F (36.6 C) (11/13 2320) Pulse Rate:  [67-75] 67 (11/13 2320) Resp:  [13-20] 18 (11/13 2320) BP: (106-133)/(55-68) 106/57 (11/13 2320) SpO2:  [93 %-99 %] 99 % (11/13 2320) Weight:  [51.3 kg] 51.3 kg (11/13 1736)   Physical Exam: General: 44 year old African-American female lying in bed, no acute distress, nasal cannula Cardiovascular: s1 and s2, RRR Respiratory: Normal work of breathing, reduced air entry right mid to lower lobe. Chest tube incision site clean and dry. Abdomen: Mildly distended, firm in right upper quadrant, non tender, bowel sounds present Extremities: no peripheral edema   Laboratory: Recent Labs  Lab 05/31/20 0248 06/01/20 0158 06/02/20 0241  WBC 6.7 6.8 7.5  HGB 10.2* 11.0* 11.3*  HCT 32.4* 34.5* 35.9*  PLT 255 291 333   Recent Labs  Lab 05/30/20 1352 05/30/20 1352  05/31/20 0248 05/31/20 0248 06/01/20 0158 06/01/20 1617 06/02/20 0241  NA 137   < > 138   < > 136 134* 133*  K 4.8   < > 4.3   < > 4.3 4.4 5.0  CL 92*   < > 95*   < > 94* 93* 89*  CO2 28   < > 30   < > $R'30 28 29  'Wf$ BUN 22*   < > 26*   < > 20 27* 33*  CREATININE 5.39*   < > 6.42*   < > 4.27* 5.18* 5.85*  CALCIUM 9.3   < > 9.4   < > 9.2 9.2 9.8  PROT 7.7  --  7.5  --   --  7.3  --   BILITOT 1.7*  --  1.3*  --   --  1.2  --   ALKPHOS 127*  --  131*  --   --  125  --   ALT 9  --  9  --   --  8  --   AST 23  --  16  --   --  16  --   GLUCOSE 130*   < > 156*   < > 127* 112* 229*   < > = values in this interval not displayed.      Imaging/Diagnostic Tests: DG CHEST PORT 1 VIEW  Result Date: 06/02/2020 CLINICAL DATA:  Chest tube placement with known right apical pneumothorax EXAM: PORTABLE CHEST 1 VIEW COMPARISON:  November 12 21 FINDINGS: Chest tube position unchanged. Small right apical pneumothorax persists. Small right pleural effusion persists as well. There is ill-defined airspace opacity in both lower lung regions, likely due primarily to atelectasis. Heart is enlarged with pulmonary vascularity normal. No adenopathy. Postoperative change noted in the left axillary region. IMPRESSION: Chest tube position on the right unchanged. Small right pleural effusion and small right apical pneumothorax stable. Atelectatic change in both lower lung regions. Stable cardiac prominence. Electronically Signed   By: Lowella Grip III M.D.   On: 06/02/2020 08:00   DG CHEST PORT 1 VIEW  Result Date: 06/01/2020 CLINICAL DATA:  44 year old female with a history of chest tube placement EXAM: PORTABLE CHEST 1 VIEW COMPARISON:  06/01/2020, 05/31/2020 FINDINGS: Cardiomediastinal silhouette unchanged in size and contour. Unchanged right-sided pigtail thoracostomy tube, terminating in the sub pulmonic space. Redemonstration of ex vacuo hydropneumothorax, with improved volume of pleural fluid compared to the  preoperative chest x-ray. Similar appearance of mixed reticulonodular opacities of the lungs. IMPRESSION: Unchanged position of right thoracostomy tube in the sub pulmonic space with similar appearance of the ex vacuo hydropneumothorax. Similar appearance of mixed reticulonodular opacities of the bilateral lungs. Electronically Signed   By: Corrie Mckusick D.O.   On: 06/01/2020 16:46   DG CHEST PORT 1 VIEW  Result Date: 06/01/2020 CLINICAL DATA:  Pleural effusion. EXAM: PORTABLE CHEST 1 VIEW COMPARISON:  CT chest and chest x-ray from yesterday. FINDINGS: New right-sided chest tube with resolved right-sided pleural effusion. Small right pneumothorax ex vacuo is present due to incomplete expansion of the right middle and lower lobes. Patchy consolidation in the right upper lobe, right middle lobe, and right lower lobe is not significantly changed. Left lung is clear. No acute osseous abnormality. IMPRESSION: 1. Resolved right pleural effusion status post chest tube placement. Small right pneumothorax  ex vacuo is present due to incomplete expansion of the right middle and lower lobes. 2. Unchanged multifocal right lung pneumonia. Electronically Signed   By: Titus Dubin M.D.   On: 06/01/2020 15:01    Lattie Haw, MD 06/03/2020, 12:22 AM PGY-2, [place] Intern pager: [phone], text pages welcome

## 2020-06-03 NOTE — Discharge Instructions (Addendum)
You were admitted with shortness of breath. You had a lot of fluid in your right lung which was drained with a chest tube. You will most likely need a surgical procedure an outpatient to prevent from fluid from accumulating in the lungs. Please call thoracic surgery and make an appointment to see Dr. Roxan Hockey or Dr. Kipp Brood Their information is listed in this packet. Your home oxygen has been ordered.  Please follow up with your PCP.  Best wishes  [facility] Medicine Team

## 2020-06-03 NOTE — Progress Notes (Addendum)
Pt called out requesting insulin- her sensor connected to her phone read 311. I checked it w/ the hospital meter and it was 267. Pt states her pump (which had died yesterday so has been disconnected) checked+covered her sugar every 4 hours; page on-call and received x1 dose of 3u novolog. Will continue to monitor.

## 2020-06-03 NOTE — Progress Notes (Addendum)
[facility] Progress Note   Subjective:  Patient seen and examined at bedside.  Feeling better.  Continues to have dyspnea with exertion.  Decreased goal at HD yesterday due to feeling poorly and n/v prior to HD.  Tolerated treatment well except for cramping near the end. We only were able to remove 100 mL fluid.  Denies n/v/d, abdominal pain, CP, weakness and fatigue today. Blood sugars have been elevated.  Insulin pump battery died Oct 12, 2022 night. Chest tube removed yesterday.  Denies pain.   Objective Vitals:   06/02/20 2026 06/02/20 2320 06/03/20 0350 06/03/20 0755  BP: 111/60 (!) 106/57 (!) 123/59 132/69  Pulse: 74 67 70 73  Resp: 15 18 18 16   Temp: 98.2 F (36.8 C) 97.9 F (36.6 C) 97.8 F (36.6 C) 97.8 F (36.6 C)  TempSrc: Oral Oral Oral Oral  SpO2: 97% 99% 98% 100%  Weight:      Height:       Physical Exam General:WDWN, thin female in NAD Heart:RRR Lungs:breath sounds decreased, +crackles in bases Abdomen:soft, NT, +distended Extremities:no LE edema Dialysis Access: LU AVF +b/t   Filed Weights   05/31/20 1040 06/01/20 1332 06/02/20 1736  Weight: 52.5 kg 52.5 kg 51.3 kg    Intake/Output Summary (Last 24 hours) at 06/03/2020 1118 Last data filed at 06/02/2020 1717 Gross per 24 hour  Intake --  Output 100 ml  Net -100 ml    Additional Objective Labs: Basic Metabolic Panel: Recent Labs  Lab 05/31/20 0248 05/31/20 0248 06/01/20 0158 06/01/20 0158 06/01/20 1617 06/02/20 0241 06/03/20 0207  NA 138   < > 136   < > 134* 133* 134*  K 4.3   < > 4.3   < > 4.4 5.0 4.3  CL 95*   < > 94*   < > 93* 89* 93*  CO2 30   < > 30   < > 28 29 29   GLUCOSE 156*   < > 127*   < > 112* 229* 198*  BUN 26*   < > 20   < > 27* 33* 14  CREATININE 6.42*   < > 4.27*   < > 5.18* 5.85* 3.39*  CALCIUM 9.4   < > 9.2   < > 9.2 9.8 8.9  PHOS 5.9*  --  4.4  --   --   --  3.6   < > = values in this interval not displayed.   Liver Function Tests: Recent Labs  Lab  05/30/20 1352 05/30/20 1352 05/31/20 0248 05/31/20 0248 06/01/20 0158 06/01/20 1617 06/03/20 0207  AST 23  --  16  --   --  16  --   ALT 9  --  9  --   --  8  --   ALKPHOS 127*  --  131*  --   --  125  --   BILITOT 1.7*  --  1.3*  --   --  1.2  --   PROT 7.7  --  7.5  --   --  7.3  --   ALBUMIN 3.0*   < > 2.8*   2.9*   < > 2.7* 2.8* 2.6*   < > = values in this interval not displayed.   CBC: Recent Labs  Lab 05/30/20 1352 05/30/20 1352 05/31/20 0248 05/31/20 0248 06/01/20 0158 06/02/20 0241 06/03/20 0207  WBC 5.9   < > 6.7   < > 6.8 7.5 6.2  NEUTROABS 3.7  --   --   --   --   --   --  HGB 10.7*   < > 10.2*   < > 11.0* 11.3* 10.2*  HCT 34.6*   < > 32.4*   < > 34.5* 35.9* 32.1*  MCV 91.3  --  88.0  --  89.4 87.3 88.7  PLT 245   < > 255   < > 291 333 325   < > = values in this interval not displayed.   Blood Culture    Component Value Date/Time   SDES PLEURAL FLUID 05/31/2020 1205   SDES PLEURAL FLUID 05/31/2020 1205   SPECREQUEST RIGHT THORACENTESIS 05/31/2020 1205   SPECREQUEST RIGHT THORACENTESIS 05/31/2020 1205   CULT  05/31/2020 1205    NO GROWTH 3 DAYS Performed at [facility], [place] [address] [place], [place] [address]    REPTSTATUS 05/31/2020 FINAL 05/31/2020 1205   REPTSTATUS PENDING 05/31/2020 1205   CBG: Recent Labs  Lab 06/02/20 0627 06/02/20 0826 06/02/20 1200 06/02/20 1824 06/03/20 0617  GLUCAP 176* 134* 180* 74 211*   Iron Studies: No results for input(s): IRON, TIBC, TRANSFERRIN, FERRITIN in the last 72 hours. Lab Results  Component Value Date   INR 1.1 10/21/2019   INR 1.15 04/16/2018   INR 1.04 03/29/2014   Studies/Results: DG CHEST PORT 1 VIEW  Result Date: 06/02/2020 CLINICAL DATA:  Chest tube placement with known right apical pneumothorax EXAM: PORTABLE CHEST 1 VIEW COMPARISON:  November 12 21 FINDINGS: Chest tube position unchanged. Small right apical pneumothorax persists. Small right pleural effusion persists as well.  There is ill-defined airspace opacity in both lower lung regions, likely due primarily to atelectasis. Heart is enlarged with pulmonary vascularity normal. No adenopathy. Postoperative change noted in the left axillary region. IMPRESSION: Chest tube position on the right unchanged. Small right pleural effusion and small right apical pneumothorax stable. Atelectatic change in both lower lung regions. Stable cardiac prominence. Electronically Signed   By: Lowella Grip III M.D.   On: 06/02/2020 08:00   DG CHEST PORT 1 VIEW  Result Date: 06/01/2020 CLINICAL DATA:  44 year old female with a history of chest tube placement EXAM: PORTABLE CHEST 1 VIEW COMPARISON:  06/01/2020, 05/31/2020 FINDINGS: Cardiomediastinal silhouette unchanged in size and contour. Unchanged right-sided pigtail thoracostomy tube, terminating in the sub pulmonic space. Redemonstration of ex vacuo hydropneumothorax, with improved volume of pleural fluid compared to the preoperative chest x-ray. Similar appearance of mixed reticulonodular opacities of the lungs. IMPRESSION: Unchanged position of right thoracostomy tube in the sub pulmonic space with similar appearance of the ex vacuo hydropneumothorax. Similar appearance of mixed reticulonodular opacities of the bilateral lungs. Electronically Signed   By: Corrie Mckusick D.O.   On: 06/01/2020 16:46   DG CHEST PORT 1 VIEW  Result Date: 06/01/2020 CLINICAL DATA:  Pleural effusion. EXAM: PORTABLE CHEST 1 VIEW COMPARISON:  CT chest and chest x-ray from yesterday. FINDINGS: New right-sided chest tube with resolved right-sided pleural effusion. Small right pneumothorax ex vacuo is present due to incomplete expansion of the right middle and lower lobes. Patchy consolidation in the right upper lobe, right middle lobe, and right lower lobe is not significantly changed. Left lung is clear. No acute osseous abnormality. IMPRESSION: 1. Resolved right pleural effusion status post chest tube placement.  Small right pneumothorax ex vacuo is present due to incomplete expansion of the right middle and lower lobes. 2. Unchanged multifocal right lung pneumonia. Electronically Signed   By: Titus Dubin M.D.   On: 06/01/2020 15:01    Medications:   amLODipine  10 mg Oral  QHS   calcium acetate  667 mg Oral TID WC   Chlorhexidine Gluconate Cloth  6 each Topical Q0600   [START ON 06/05/2020] cinacalcet  30 mg Oral Once per day on Tue Thu Sat   doxercalciferol  2 mcg Intravenous Q T,Th,Sa-HD   heparin  5,000 Units Subcutaneous Q8H   insulin aspart  0-15 Units Subcutaneous TID with meals   insulin aspart  3 Units Subcutaneous TID WC   insulin glargine  6 Units Subcutaneous Q breakfast   labetalol  300 mg Oral QHS   lidocaine  1 patch Transdermal Q24H   losartan  50 mg Oral QPM   polyethylene glycol  17 g Oral Daily   rosuvastatin  10 mg Oral QHS   sodium chloride flush  10 mL Intracatheter Q8H    Dialysis Orders: [facility] TTS 425 / 800, 2K 2.25 Ca, EDW 53, 3:30, AVF last tx 11/9 - post wt 52.7, post BP 152/75 runs full tx venofer 50 weekly 11/4 Hb 11.9, no ESA; Phos 7.1, Ca 9.6, PTH 04/2020 445 hectorol 2 TIW, sensipar 30 TIW  Assessment/Plan: 1.R pleural effusion- s/p thoracentesis by IR 0.9L transudate fluid removed on 11/11.  Cultures NGTD. CT post procedure showed moderate R pleural effusion and mass like consolidation on R minor fissure. Chest tube inserted 11/12 and removed on 11/13. CTS consult for possible VATS procedure.  Per Primary/PCCM 2. Hypoxia- ongoing O2 requirement.  Will need O2 on d/c 3. Abdominal distention- no ascites on [place], but did note chronic cholecystitis. CT chest/abd on 11/11 showed small ascites and anasarca. Wkup per primary.  2. ESRD- on HD TTS. Next HD 11/16 3. Anemiaof CKD-Hgb 10.2. No indication for ESA 4. Secondary hyperparathyroidism- Ca and phos in goal. Continue VDRA, binders and sensipar 5.HTN/volume- BP well  controlled. Continue home meds. Net UF 1.5L removed 11/11, and 0.1L on 11/13. +anacarsa on CT. A little under dry weight, continue to challenge and lower as tolerated - post wt yesterday 51.3kg.  6. Nutrition- Renal diet w/fluid restrictions. Alb 2.7. +protein supplements.  Jen Mow, PA-C [facility] 06/03/2020,11:18 AM  LOS: 4 days

## 2020-06-03 NOTE — Progress Notes (Signed)
Paged by nurse that patient's phone alerted her that CBG was 311.  Check with hospital meter 267.  Pump died yesterday and has been disconnected.  Pt requesting coverage for her elevated CBG.  Given one time 3u novolog.  [place] Constable, D.O.  PGY-3 Family Medicine  06/03/2020 4:26 AM

## 2020-06-04 ENCOUNTER — Encounter (HOSPITAL_COMMUNITY): Payer: Self-pay | Admitting: Pulmonary Disease

## 2020-06-04 ENCOUNTER — Telehealth: Payer: Self-pay | Admitting: Nephrology

## 2020-06-04 LAB — CYTOLOGY - NON PAP

## 2020-06-04 NOTE — Telephone Encounter (Signed)
Transition of care contact from inpatient facility  Date of Discharge: 06/03/20 Date of Contact: 06/04/20 Method of contact: Phone  Attempted to contact patient to discuss transition of care from inpatient admission. Patient did not answer the phone. Message was left on the patient's voicemail with call back number [phone].

## 2020-06-05 DIAGNOSIS — D631 Anemia in chronic kidney disease: Secondary | ICD-10-CM | POA: Diagnosis not present

## 2020-06-05 DIAGNOSIS — D689 Coagulation defect, unspecified: Secondary | ICD-10-CM | POA: Diagnosis not present

## 2020-06-05 DIAGNOSIS — Z992 Dependence on renal dialysis: Secondary | ICD-10-CM | POA: Diagnosis not present

## 2020-06-05 DIAGNOSIS — N186 End stage renal disease: Secondary | ICD-10-CM | POA: Diagnosis not present

## 2020-06-05 DIAGNOSIS — E1029 Type 1 diabetes mellitus with other diabetic kidney complication: Secondary | ICD-10-CM | POA: Diagnosis not present

## 2020-06-05 DIAGNOSIS — D509 Iron deficiency anemia, unspecified: Secondary | ICD-10-CM | POA: Diagnosis not present

## 2020-06-05 DIAGNOSIS — N2581 Secondary hyperparathyroidism of renal origin: Secondary | ICD-10-CM | POA: Diagnosis not present

## 2020-06-05 LAB — CULTURE, BODY FLUID W GRAM STAIN -BOTTLE: Culture: NO GROWTH

## 2020-06-06 ENCOUNTER — Other Ambulatory Visit: Payer: Self-pay | Admitting: Thoracic Surgery (Cardiothoracic Vascular Surgery)

## 2020-06-06 DIAGNOSIS — J90 Pleural effusion, not elsewhere classified: Secondary | ICD-10-CM

## 2020-06-06 LAB — CHOLESTEROL, BODY FLUID: Cholesterol, Fluid: 84 mg/dL

## 2020-06-07 ENCOUNTER — Encounter: Payer: Self-pay | Admitting: Thoracic Surgery (Cardiothoracic Vascular Surgery)

## 2020-06-07 ENCOUNTER — Ambulatory Visit
Admission: RE | Admit: 2020-06-07 | Discharge: 2020-06-07 | Disposition: A | Discharge status: Home / Self Care | Payer: Medicare HMO | Source: Ambulatory Visit | Attending: Thoracic Surgery (Cardiothoracic Vascular Surgery) | Admitting: Thoracic Surgery (Cardiothoracic Vascular Surgery)

## 2020-06-07 ENCOUNTER — Other Ambulatory Visit: Payer: Self-pay

## 2020-06-07 ENCOUNTER — Institutional Professional Consult (permissible substitution) (INDEPENDENT_AMBULATORY_CARE_PROVIDER_SITE_OTHER): Payer: Medicare HMO | Admitting: Thoracic Surgery (Cardiothoracic Vascular Surgery)

## 2020-06-07 ENCOUNTER — Other Ambulatory Visit: Payer: Self-pay | Admitting: *Deleted

## 2020-06-07 VITALS — BP 162/70 | HR 87 | Resp 18 | Wt 117.0 lb

## 2020-06-07 DIAGNOSIS — I517 Cardiomegaly: Secondary | ICD-10-CM | POA: Diagnosis not present

## 2020-06-07 DIAGNOSIS — Z992 Dependence on renal dialysis: Secondary | ICD-10-CM | POA: Diagnosis not present

## 2020-06-07 DIAGNOSIS — D689 Coagulation defect, unspecified: Secondary | ICD-10-CM | POA: Diagnosis not present

## 2020-06-07 DIAGNOSIS — N186 End stage renal disease: Secondary | ICD-10-CM | POA: Diagnosis not present

## 2020-06-07 DIAGNOSIS — J90 Pleural effusion, not elsewhere classified: Secondary | ICD-10-CM

## 2020-06-07 DIAGNOSIS — N2581 Secondary hyperparathyroidism of renal origin: Secondary | ICD-10-CM | POA: Diagnosis not present

## 2020-06-07 DIAGNOSIS — E1029 Type 1 diabetes mellitus with other diabetic kidney complication: Secondary | ICD-10-CM | POA: Diagnosis not present

## 2020-06-07 DIAGNOSIS — D509 Iron deficiency anemia, unspecified: Secondary | ICD-10-CM | POA: Diagnosis not present

## 2020-06-07 DIAGNOSIS — D631 Anemia in chronic kidney disease: Secondary | ICD-10-CM | POA: Diagnosis not present

## 2020-06-07 NOTE — Progress Notes (Signed)
PCP is Trey Sailors, PA Referring Provider is Kinnie Feil, MD  Chief Complaint  Patient presents with  . Pleural Effusion    new patient consultation, with xray    HPI: Ms. Castonguay is sent for consultation regarding a pleural effusion.  Rever Pichette is a 44 year old woman with a history of type 1 diabetes complicated by retinopathy and nephropathy, end-stage renal disease on hemodialysis, tricuspid valve regurgitation, hypertension, hyperlipidemia, reflux, thyromegaly, arthritis, anemia, ascites, and a right pleural effusion.  She presented on 05/30/2020 with shortness of breath.  She was found to have a large right pleural effusion.  Thoracentesis drained 900 mL of fluid but was stopped due to discomfort.  She then had a pigtail catheter placed which drained additional fluid.  She did have incomplete reexpansion of the right lung.  She says she has been feeling short of breath for couple of months prior to that.  Pleural fluid showed an albumin of 2.1, total protein 4.5, LDH 198, glucose 93, triglycerides less than 9.  This was initially thought to be an exudate but later was felt to be a transudate based on albumin gradient.  Her pigtail catheter was removed on 06/02/2020 and she was discharged.  She says that having the fluid drained helped a little but did not resolve her shortness of breath completely.  Her symptoms have been relatively stable since discharge.  She gets short of breath with exertion.  She says she notices that her oxygen saturations drop when she lies flat on her back so she keeps her head elevated.  She also complains of increasing abdominal distention since discharge. Past Medical History:  Diagnosis Date  . Anemia   . Arthritis   . Ascites 03/28/2020  . Diabetes mellitus   . Diabetic retinopathy ([place])    Hx of laser Rx's  . DM type 1 (diabetes mellitus, type 1) ([place]) 09/12/2014   Age of onset for DM type 1 was age 49.     . Enlarged thyroid gland   .  ESRD on hemodialysis ([place])    ESRD due to DM type I, age of onset DM 1 was age 50.  Went on dialysis in March 2012.  Gets HD now at [facility] on a TTS schedule.    Marland Kitchen ESRD on hemodialysis ([place]) 09/12/2014   ESRD due to DM type 1.  Started HD in 2012 at [facility].  Gets HD there on a TTS schedule.    Marland Kitchen GERD (gastroesophageal reflux disease)   . History of blood transfusion   . Hyperlipidemia   . Hypertension   . Peripheral vascular disease ([place])   . Pneumonia     Past Surgical History:  Procedure Laterality Date  . ARTERIOVENOUS GRAFT PLACEMENT    . ARTERY REPAIR Left 12/10/2012   Procedure: BRACHIAL ARTERY REPAIR;  Surgeon: Angelia Mould, MD;  Location: [facility] OR;  Service: Vascular;  Laterality: Left;  Exploration Left Brachial Artery for AVF  . AV FISTULA PLACEMENT Right 01/11/2018   Procedure: INSERTION OF ARTERIOVENOUS (AV) GORE-TEX GRAFT  UPPER ARM;  Surgeon: Angelia Mould, MD;  Location: [place];  Service: Vascular;  Laterality: Right;  . BASCILIC VEIN TRANSPOSITION Right 09/28/2017   Procedure: FIRST STAGE BASILIC VEIN TRANSPOSITION;  Surgeon: Angelia Mould, MD;  Location: [place];  Service: Vascular;  Laterality: Right;  . CESAREAN SECTION    . CHEST TUBE INSERTION Right 06/01/2020   Procedure: CHEST TUBE INSERTION;  Surgeon: Garner Nash, DO;  Location: [facility] ENDOSCOPY;  Service: Pulmonary;  Laterality: Right;  . EYE SURGERY     [place]  . EYE SURGERY Left   . IR THORACENTESIS ASP PLEURAL SPACE W/IMG GUIDE  05/31/2020  . REVISION OF ARTERIOVENOUS GORETEX GRAFT Left 08/01/2016   Procedure: REVISION OF ARTERIOVENOUS GORETEX GRAFT;  Surgeon: Angelia Mould, MD;  Location: [place];  Service: Vascular;  Laterality: Left;  . REVISION OF ARTERIOVENOUS GORETEX GRAFT Right 10/21/2019   Procedure: REVISION OF ARTERIOVENOUS GORETEX GRAFT ARM;  Surgeon: Rosetta Posner, MD;  Location: [place];  Service: Vascular;  Laterality: Right;  . SHUNTOGRAM Left 11/30/2013    Procedure: SHUNTOGRAM;  Surgeon: Serafina Mitchell, MD;  Location: [facility] CATH LAB;  Service: Cardiovascular;  Laterality: Left;  . TEE WITHOUT CARDIOVERSION N/A 02/13/2020   Procedure: TRANSESOPHAGEAL ECHOCARDIOGRAM (TEE);  Surgeon: Josue Hector, MD;  Location: [facility] ENDOSCOPY;  Service: Cardiovascular;  Laterality: N/A;    Family History  Problem Relation Age of Onset  . Cancer Mother        breast  . Hypertension Father     Social History Social History   Tobacco Use  . Smoking status: Never Smoker  . Smokeless tobacco: Never Used  Vaping Use  . Vaping Use: Never used  Substance Use Topics  . Alcohol use: No    Alcohol/week: 0.0 standard drinks  . Drug use: No    Current Outpatient Medications  Medication Sig Dispense Refill  . amLODipine (NORVASC) 10 MG tablet Take 10 mg by mouth at bedtime.     . calcium acetate (PHOSLO) 667 MG capsule Take 667 mg by mouth 3 (three) times daily with meals. May take an additional 667 mg dose with snacks    . GLUCAGON EMERGENCY 1 MG injection Inject 1 mg into the muscle once as needed (for severe low blood sugar).     . insulin aspart (NOVOLOG) 100 UNIT/ML injection Inject 50 Units into the skin See admin instructions. Use with insulin pump daily    . Insulin Human (INSULIN PUMP) SOLN Inject into the skin continuous. Novolog   Basal rate 0.29 units/ hour    . labetalol (NORMODYNE) 300 MG tablet TAKE 1 TABLET(300 MG) BY MOUTH TWICE DAILY (Patient taking differently: Take 300 mg by mouth daily. Takes once daily) 180 tablet 1  . losartan (COZAAR) 50 MG tablet Take 50 mg by mouth every evening.     . rosuvastatin (CRESTOR) 10 MG tablet Take 10 mg by mouth at bedtime.      No current facility-administered medications for this visit.    Allergies  Allergen Reactions  . Pollen Extract Other (See Comments)    Watery eyes    Review of Systems  Constitutional: Positive for unexpected weight change.  HENT: Negative for trouble swallowing and voice  change.   Respiratory: Positive for shortness of breath. Negative for wheezing.   Cardiovascular: Positive for leg swelling. Negative for chest pain.  Gastrointestinal: Positive for abdominal distention. Negative for abdominal pain.    BP (!) 162/70 (BP Location: Left Arm, Patient Position: Sitting, Cuff Size: Small) Comment (BP Location): forearm  Pulse 87   Resp 18   Wt 117 lb (53.1 kg)   SpO2 (!) 89% Comment: RA  BMI 20.73 kg/m  Physical Exam Vitals reviewed.  Constitutional:      General: She is not in acute distress.    Appearance: Normal appearance.  HENT:     Head: Normocephalic and atraumatic.  Eyes:  General: No scleral icterus.    Extraocular Movements: Extraocular movements intact.  Cardiovascular:     Rate and Rhythm: Normal rate and regular rhythm.     Heart sounds: Murmur (3/6 systolic murmur variable with respiration ) heard.   Pulmonary:     Effort: Pulmonary effort is normal. No respiratory distress.     Breath sounds: No wheezing or rales.     Comments: Diminished breath sounds right base Abdominal:     General: There is distension.     Comments: Ascites  Musculoskeletal:        General: Swelling (Trace edema) present.     Cervical back: Neck supple.  Lymphadenopathy:     Cervical: No cervical adenopathy.  Skin:    General: Skin is warm and dry.  Neurological:     General: No focal deficit present.     Mental Status: She is alert and oriented to person, place, and time.     Cranial Nerves: No cranial nerve deficit.    Diagnostic Tests: CT CHEST, ABDOMEN, AND PELVIS WITH CONTRAST  TECHNIQUE: Multidetector CT imaging of the chest, abdomen and pelvis was performed following the standard protocol during bolus administration of intravenous contrast.  CONTRAST:  51mL OMNIPAQUE IOHEXOL 300 MG/ML  SOLN  COMPARISON:  CT abdomen pelvis dated 12/25/2014.  FINDINGS: CT CHEST FINDINGS  Cardiovascular: There is mild cardiomegaly. There is a  small pericardial effusion measuring approximately 1 cm in thickness anterior to the heart. The thoracic aorta is unremarkable. The origins of the great vessels of the aortic arch appear patent as visualized. The central pulmonary arteries are patent.  Mediastinum/Nodes: No definite hilar adenopathy. Evaluation of the right hilum however is limited due to consolidative changes of the adjacent lung. Subcarinal adenopathy measure up to 19 mm in short axis. The esophagus is grossly unremarkable. The thyroid gland is heterogeneous likely containing multiple nodules. Recommend thyroid US (ref: J Am Coll Radiol. 2015 Feb;12(2): 143-50).There is mild diffuse mediastinal edema.  Lungs/Pleura: There is a moderate right pleural effusion. There is consolidative changes of the majority of the right lung which may represent atelectasis or infiltrate. There are scattered clusters of ground-glass opacity in the right upper lobe and right middle lobe which may represent atelectasis or infiltrate. There is a 2.2 x 3.4 cm focal consolidation along the right minor fissure (24/3), possibly atelectasis. A mass is not excluded.  There is a trace left pleural effusion and minimal left lung base atelectasis. Several small scattered pneumatoceles noted. There is no pneumothorax. The central airways are patent.  Musculoskeletal: There is osteosclerosis consistent with renal osteodystrophy. No acute osseous pathology.  CT ABDOMEN PELVIS FINDINGS  No intra-abdominal free air. Small ascites.  Hepatobiliary: The liver is enlarged measuring 19 cm in midclavicular length. No intrahepatic biliary ductal dilatation. There is calcified stone within the gallbladder versus calcified gallbladder wall.  Pancreas: Unremarkable. No pancreatic ductal dilatation or surrounding inflammatory changes.  Spleen: Normal in size without focal abnormality.  Adrenals/Urinary Tract: The adrenal glands unremarkable.  Severe bilateral renal parenchyma atrophy. Several small hypodense renal lesions are not characterized on this CT. There is no hydronephrosis or nephrolithiasis on either side. The urinary bladder is collapsed.  Stomach/Bowel: The stomach is distended with ingested content. There is no bowel obstruction. The appendix is unremarkable as visualized.  Vascular/Lymphatic: The abdominal aorta and IVC are unremarkable. No portal venous gas. No definite adenopathy.  Reproductive: The uterus and ovaries are grossly unremarkable.  Other: There is diffuse subcutaneous edema and  anasarca.  Musculoskeletal: Diffuse osseous sclerosis in keeping with renal osteodystrophy. No acute osseous pathology.  IMPRESSION: 1. Moderate size right pleural effusion with right lower lobe atelectasis/infiltrate. Focal masslike consolidation along the right minor fissure may represent atelectasis/infiltrate. A mass is not excluded. 2. Cardiomegaly with a small pericardial effusion. 3. Small ascites and anasarca. 4. Hepatomegaly. 5. Cholelithiasis. 6. Severe bilateral renal parenchyma atrophy and findings of renal osteodystrophy. 7. No bowel obstruction. Normal appendix.   Electronically Signed   By: Anner Crete M.D.   On: 05/31/2020 21:26 CHEST - 2 VIEW  COMPARISON:  06/02/2020  FINDINGS: Interval removal of right basilar chest tube. Increasing right-sided pleural effusion, now moderate. Associated hazy right basilar opacity. No discernible residual right-sided pneumothorax. Left lung is clear. Stable cardiomegaly.  IMPRESSION: 1. Interval removal of right-sided chest tube with increasing right-sided pleural effusion, now moderate in size. 2. No residual right apical pneumothorax is seen.   Electronically Signed   By: Davina Poke D.O.   On: 06/07/2020 14:56 I personally reviewed the CT chest x-ray images and concur with the findings noted above.  She has had partial  reaccumulation of right pleural effusion since the tube was removed.  Impression: Yalanda Soderman is a 44 year old woman with a history of type 1 diabetes complicated by retinopathy and nephropathy, end-stage renal disease on hemodialysis, tricuspid valve regurgitation, hypertension, hyperlipidemia, reflux, thyromegaly, arthritis, anemia, ascites, and a right pleural effusion.  She recently was admitted with shortness of breath and was found to have a large right pleural effusion.  She had thoracentesis initially followed by pigtail catheter placement.  She had incomplete reexpansion of the lung.  The effusion had some characteristics of both transudate and exudate.  The compounding factor in her situation is her ascites and probable right heart failure.  She has an appointment with the advanced heart failure team in January.  I am going to try to see if we can get that appointment earlier as that would need to be addressed before I have any hope of controlling this effusion.  I discussed 2 potential options with Ms. Fabel.  One would be to place a pleural catheter for symptomatic relief.  That could provide symptomatic relief while we are completing her work-up.  She is reluctant to have an indwelling catheter.  Second option would be to do a VATS for decortication.  That is unlikely to be successful if there is a significant heart failure component.  Plan: We will try to get her evaluated by the advanced heart failure team I will see her back next week with a PA and lateral chest x-ray.  Melrose Nakayama, MD [facility] [phone]

## 2020-06-09 DIAGNOSIS — E1029 Type 1 diabetes mellitus with other diabetic kidney complication: Secondary | ICD-10-CM | POA: Diagnosis not present

## 2020-06-09 DIAGNOSIS — Z992 Dependence on renal dialysis: Secondary | ICD-10-CM | POA: Diagnosis not present

## 2020-06-09 DIAGNOSIS — D509 Iron deficiency anemia, unspecified: Secondary | ICD-10-CM | POA: Diagnosis not present

## 2020-06-09 DIAGNOSIS — D689 Coagulation defect, unspecified: Secondary | ICD-10-CM | POA: Diagnosis not present

## 2020-06-09 DIAGNOSIS — D631 Anemia in chronic kidney disease: Secondary | ICD-10-CM | POA: Diagnosis not present

## 2020-06-09 DIAGNOSIS — N2581 Secondary hyperparathyroidism of renal origin: Secondary | ICD-10-CM | POA: Diagnosis not present

## 2020-06-09 DIAGNOSIS — N186 End stage renal disease: Secondary | ICD-10-CM | POA: Diagnosis not present

## 2020-06-11 DIAGNOSIS — N2581 Secondary hyperparathyroidism of renal origin: Secondary | ICD-10-CM | POA: Diagnosis not present

## 2020-06-11 DIAGNOSIS — D631 Anemia in chronic kidney disease: Secondary | ICD-10-CM | POA: Diagnosis not present

## 2020-06-11 DIAGNOSIS — R0902 Hypoxemia: Secondary | ICD-10-CM | POA: Diagnosis not present

## 2020-06-11 DIAGNOSIS — D509 Iron deficiency anemia, unspecified: Secondary | ICD-10-CM | POA: Diagnosis not present

## 2020-06-11 DIAGNOSIS — E1029 Type 1 diabetes mellitus with other diabetic kidney complication: Secondary | ICD-10-CM | POA: Diagnosis not present

## 2020-06-11 DIAGNOSIS — J918 Pleural effusion in other conditions classified elsewhere: Secondary | ICD-10-CM | POA: Diagnosis not present

## 2020-06-11 DIAGNOSIS — D689 Coagulation defect, unspecified: Secondary | ICD-10-CM | POA: Diagnosis not present

## 2020-06-11 DIAGNOSIS — Z992 Dependence on renal dialysis: Secondary | ICD-10-CM | POA: Diagnosis not present

## 2020-06-11 DIAGNOSIS — N186 End stage renal disease: Secondary | ICD-10-CM | POA: Diagnosis not present

## 2020-06-12 ENCOUNTER — Encounter: Payer: Medicare HMO | Admitting: Thoracic Surgery (Cardiothoracic Vascular Surgery)

## 2020-06-12 ENCOUNTER — Other Ambulatory Visit: Payer: Self-pay | Admitting: Thoracic Surgery (Cardiothoracic Vascular Surgery)

## 2020-06-12 DIAGNOSIS — R0902 Hypoxemia: Secondary | ICD-10-CM | POA: Diagnosis not present

## 2020-06-12 DIAGNOSIS — R0600 Dyspnea, unspecified: Secondary | ICD-10-CM | POA: Diagnosis not present

## 2020-06-12 DIAGNOSIS — J90 Pleural effusion, not elsewhere classified: Secondary | ICD-10-CM

## 2020-06-13 ENCOUNTER — Other Ambulatory Visit: Payer: Self-pay | Admitting: Thoracic Surgery (Cardiothoracic Vascular Surgery)

## 2020-06-13 ENCOUNTER — Encounter: Payer: Medicare HMO | Admitting: Thoracic Surgery (Cardiothoracic Vascular Surgery)

## 2020-06-13 DIAGNOSIS — N2581 Secondary hyperparathyroidism of renal origin: Secondary | ICD-10-CM | POA: Diagnosis not present

## 2020-06-13 DIAGNOSIS — Z992 Dependence on renal dialysis: Secondary | ICD-10-CM | POA: Diagnosis not present

## 2020-06-13 DIAGNOSIS — N186 End stage renal disease: Secondary | ICD-10-CM | POA: Diagnosis not present

## 2020-06-13 DIAGNOSIS — D509 Iron deficiency anemia, unspecified: Secondary | ICD-10-CM | POA: Diagnosis not present

## 2020-06-13 DIAGNOSIS — J90 Pleural effusion, not elsewhere classified: Secondary | ICD-10-CM | POA: Diagnosis not present

## 2020-06-13 DIAGNOSIS — D689 Coagulation defect, unspecified: Secondary | ICD-10-CM | POA: Diagnosis not present

## 2020-06-13 DIAGNOSIS — E1029 Type 1 diabetes mellitus with other diabetic kidney complication: Secondary | ICD-10-CM | POA: Diagnosis not present

## 2020-06-13 DIAGNOSIS — I509 Heart failure, unspecified: Secondary | ICD-10-CM | POA: Diagnosis not present

## 2020-06-13 DIAGNOSIS — D631 Anemia in chronic kidney disease: Secondary | ICD-10-CM | POA: Diagnosis not present

## 2020-06-13 LAB — GLUCOSE, CAPILLARY
Glucose-Capillary: 153 mg/dL — ABNORMAL HIGH (ref 70–99)
Glucose-Capillary: 267 mg/dL — ABNORMAL HIGH (ref 70–99)
Glucose-Capillary: 49 mg/dL — ABNORMAL LOW (ref 70–99)
Glucose-Capillary: 93 mg/dL (ref 70–99)

## 2020-06-14 LAB — BRAIN NATRIURETIC PEPTIDE: Brain Natriuretic Peptide: 1324 pg/mL — ABNORMAL HIGH (ref <100)

## 2020-06-16 DIAGNOSIS — E1029 Type 1 diabetes mellitus with other diabetic kidney complication: Secondary | ICD-10-CM | POA: Diagnosis not present

## 2020-06-16 DIAGNOSIS — N2581 Secondary hyperparathyroidism of renal origin: Secondary | ICD-10-CM | POA: Diagnosis not present

## 2020-06-16 DIAGNOSIS — D631 Anemia in chronic kidney disease: Secondary | ICD-10-CM | POA: Diagnosis not present

## 2020-06-16 DIAGNOSIS — D509 Iron deficiency anemia, unspecified: Secondary | ICD-10-CM | POA: Diagnosis not present

## 2020-06-16 DIAGNOSIS — D689 Coagulation defect, unspecified: Secondary | ICD-10-CM | POA: Diagnosis not present

## 2020-06-16 DIAGNOSIS — N186 End stage renal disease: Secondary | ICD-10-CM | POA: Diagnosis not present

## 2020-06-16 DIAGNOSIS — Z992 Dependence on renal dialysis: Secondary | ICD-10-CM | POA: Diagnosis not present

## 2020-06-18 ENCOUNTER — Other Ambulatory Visit: Payer: Self-pay

## 2020-06-18 ENCOUNTER — Other Ambulatory Visit: Payer: Self-pay | Admitting: *Deleted

## 2020-06-18 ENCOUNTER — Ambulatory Visit (INDEPENDENT_AMBULATORY_CARE_PROVIDER_SITE_OTHER): Payer: Medicare HMO | Admitting: Thoracic Surgery (Cardiothoracic Vascular Surgery)

## 2020-06-18 ENCOUNTER — Ambulatory Visit
Admission: RE | Admit: 2020-06-18 | Discharge: 2020-06-18 | Disposition: A | Discharge status: Home / Self Care | Payer: Medicare HMO | Source: Ambulatory Visit | Attending: Thoracic Surgery (Cardiothoracic Vascular Surgery) | Admitting: Thoracic Surgery (Cardiothoracic Vascular Surgery)

## 2020-06-18 ENCOUNTER — Encounter: Payer: Self-pay | Admitting: Thoracic Surgery (Cardiothoracic Vascular Surgery)

## 2020-06-18 VITALS — BP 121/67 | HR 80 | Resp 18 | Ht 63.0 in | Wt 121.0 lb

## 2020-06-18 DIAGNOSIS — J90 Pleural effusion, not elsewhere classified: Secondary | ICD-10-CM

## 2020-06-18 DIAGNOSIS — J9811 Atelectasis: Secondary | ICD-10-CM | POA: Diagnosis not present

## 2020-06-18 NOTE — Progress Notes (Signed)
[address]       [place] [address]             [phone]     HPI: Ms. Huizinga returns for follow-up of her right pleural effusion  [name] is a 44 year old woman with type 1 diabetes complicated by retinopathy and neuropathy, end-stage renal disease on dialysis, tricuspid regurgitation, hypertension, hyperlipidemia, reflux, thyromegaly, arthritis, anemia, ascites, and a recurrent right pleural effusion.  She presented on 05/30/2020 with shortness of breath.  She had a large right pleural effusion.  She had a thoracentesis which drained 900 mL of fluid.  She then had a pigtail catheter placed.  She had incomplete reexpansion of the lung.  That was ultimately removed.  The fluid was borderline between transudate and exudate.  She continues to have abdominal distention and some shortness of breath.  She typically gets short of breath in the middle the night and uses oxygen after that.  There has not been a dramatic change over the past 2 weeks.  She has not been able to reschedule her appointment with the advanced heart failure team which is for early January.  Past Medical History:  Diagnosis Date  . Anemia   . Arthritis   . Ascites 03/28/2020  . Diabetes mellitus   . Diabetic retinopathy ([place])    Hx of laser Rx's  . DM type 1 (diabetes mellitus, type 1) ([place]) 09/12/2014   Age of onset for DM type 1 was age 34.     . Enlarged thyroid gland   . ESRD on hemodialysis ([place])    ESRD due to DM type I, age of onset DM 1 was age 38.  Went on dialysis in March 2012.  Gets HD now at [facility] on a TTS schedule.    Marland Kitchen ESRD on hemodialysis ([place]) 09/12/2014   ESRD due to DM type 1.  Started HD in 2012 at [facility].  Gets HD there on a TTS schedule.    Marland Kitchen GERD (gastroesophageal reflux disease)   . History of blood transfusion   . Hyperlipidemia   . Hypertension   . Peripheral vascular disease ([place])   . Pneumonia     Current Outpatient Medications  Medication  Sig Dispense Refill  . amLODipine (NORVASC) 10 MG tablet Take 10 mg by mouth at bedtime.     . calcium acetate (PHOSLO) 667 MG capsule Take 667 mg by mouth 3 (three) times daily with meals. May take an additional 667 mg dose with snacks    . GLUCAGON EMERGENCY 1 MG injection Inject 1 mg into the muscle once as needed (for severe low blood sugar).     . Insulin Human (INSULIN PUMP) SOLN Inject into the skin continuous. Humalog   Basal rate 0.29 units/ hour    . labetalol (NORMODYNE) 300 MG tablet TAKE 1 TABLET(300 MG) BY MOUTH TWICE DAILY (Patient taking differently: Take 300 mg by mouth daily. Takes once daily) 180 tablet 1  . losartan (COZAAR) 50 MG tablet Take 50 mg by mouth every evening.     . rosuvastatin (CRESTOR) 10 MG tablet Take 10 mg by mouth at bedtime.     . insulin aspart (NOVOLOG) 100 UNIT/ML injection Inject 50 Units into the skin See admin instructions. Use with insulin pump daily (Patient not taking: Reported on 06/18/2020)     No current facility-administered medications for this visit.    Physical Exam BP 121/67 (BP Location: Right Arm, Patient Position:  Sitting)   Pulse 80   Resp 18   Ht 5\' 3"  (1.6 m)   Wt 121 lb (54.9 kg)   SpO2 95% Comment: RA with mask on  BMI 21.55 kg/m  44 year old woman in no acute distress Alert and oriented x3 with no focal deficits Cardiac regular rate and rhythm with 3/6 systolic murmur Lungs absent breath sounds right base Abdomen distended with ascites  Diagnostic Tests: CHEST - 2 VIEW  COMPARISON:  06/07/2020  FINDINGS: Again noted is a moderate to large sized right pleural effusion. The right pleural effusion has slightly enlarged since the previous examination. Evidence for compressive atelectasis in the right lung particularly along the anterior aspect. Left lung remains clear. Heart size is stable. Negative for pneumothorax.  IMPRESSION: Right pleural effusion is moderate to large in size. Right pleural effusion has  slightly enlarged since 06/07/2020.   Electronically Signed   By: Markus Daft M.D.   On: 06/18/2020 10:16 I personally reviewed the chest x-ray images and concur with the findings noted above  Impression: [name] is a 44 year old woman with type 1 diabetes complicated by retinopathy and neuropathy, end-stage renal disease on dialysis, tricuspid regurgitation, hypertension, hyperlipidemia, reflux, thyromegaly, arthritis, anemia, ascites, and a recurrent right pleural effusion.  Right heart failure-severe tricuspid regurgitation.  BNP elevated at 1324.  Has a right pleural effusion and ascites.  Relatively minimal lower extremity edema.  She needs to see the advanced heart failure team to optimize medical therapy before any surgical procedure.  Currently scheduled in January.  We will check to see if they can move that up.  Right pleural effusion.  Slightly larger than it was 11 days ago.  Her symptoms are relatively stable, but I am concerned that they will continue to worsen.  Will likely ultimately require a VATS.  However, I do not think we can do that until her heart failure is optimized.  In the meantime, I am going to schedule her for an ultrasound-guided thoracentesis to try to temporize until she can see the heart failure team.  Plan: Ultrasound-guided right thoracentesis I return in 2 weeks with PA and lateral chest x-ray  [place] Nakayama, MD [facility] [phone]

## 2020-06-19 DIAGNOSIS — E1029 Type 1 diabetes mellitus with other diabetic kidney complication: Secondary | ICD-10-CM | POA: Diagnosis not present

## 2020-06-19 DIAGNOSIS — D689 Coagulation defect, unspecified: Secondary | ICD-10-CM | POA: Diagnosis not present

## 2020-06-19 DIAGNOSIS — D509 Iron deficiency anemia, unspecified: Secondary | ICD-10-CM | POA: Diagnosis not present

## 2020-06-19 DIAGNOSIS — N2581 Secondary hyperparathyroidism of renal origin: Secondary | ICD-10-CM | POA: Diagnosis not present

## 2020-06-19 DIAGNOSIS — Z992 Dependence on renal dialysis: Secondary | ICD-10-CM | POA: Diagnosis not present

## 2020-06-19 DIAGNOSIS — D631 Anemia in chronic kidney disease: Secondary | ICD-10-CM | POA: Diagnosis not present

## 2020-06-19 DIAGNOSIS — N186 End stage renal disease: Secondary | ICD-10-CM | POA: Diagnosis not present

## 2020-06-20 ENCOUNTER — Other Ambulatory Visit (HOSPITAL_COMMUNITY)
Admission: RE | Admit: 2020-06-20 | Discharge: 2020-06-20 | Disposition: A | Discharge status: Home / Self Care | Payer: Medicare HMO | Source: Ambulatory Visit | Attending: Thoracic Surgery (Cardiothoracic Vascular Surgery) | Admitting: Thoracic Surgery (Cardiothoracic Vascular Surgery)

## 2020-06-20 DIAGNOSIS — Z01812 Encounter for preprocedural laboratory examination: Secondary | ICD-10-CM | POA: Diagnosis present

## 2020-06-20 DIAGNOSIS — Z20822 Contact with and (suspected) exposure to covid-19: Secondary | ICD-10-CM | POA: Insufficient documentation

## 2020-06-20 LAB — SARS CORONAVIRUS 2 (TAT 6-24 HRS): SARS Coronavirus 2: NEGATIVE

## 2020-06-21 DIAGNOSIS — D689 Coagulation defect, unspecified: Secondary | ICD-10-CM | POA: Diagnosis not present

## 2020-06-21 DIAGNOSIS — Z992 Dependence on renal dialysis: Secondary | ICD-10-CM | POA: Diagnosis not present

## 2020-06-21 DIAGNOSIS — D631 Anemia in chronic kidney disease: Secondary | ICD-10-CM | POA: Diagnosis not present

## 2020-06-21 DIAGNOSIS — N186 End stage renal disease: Secondary | ICD-10-CM | POA: Diagnosis not present

## 2020-06-21 DIAGNOSIS — D509 Iron deficiency anemia, unspecified: Secondary | ICD-10-CM | POA: Diagnosis not present

## 2020-06-21 DIAGNOSIS — R110 Nausea: Secondary | ICD-10-CM | POA: Diagnosis not present

## 2020-06-21 DIAGNOSIS — N2581 Secondary hyperparathyroidism of renal origin: Secondary | ICD-10-CM | POA: Diagnosis not present

## 2020-06-21 DIAGNOSIS — E1029 Type 1 diabetes mellitus with other diabetic kidney complication: Secondary | ICD-10-CM | POA: Diagnosis not present

## 2020-06-22 ENCOUNTER — Ambulatory Visit (HOSPITAL_COMMUNITY)
Admission: RE | Admit: 2020-06-22 | Discharge: 2020-06-22 | Disposition: A | Discharge status: Home / Self Care | Payer: Medicare HMO | Source: Ambulatory Visit | Attending: Thoracic Surgery (Cardiothoracic Vascular Surgery) | Admitting: Thoracic Surgery (Cardiothoracic Vascular Surgery)

## 2020-06-22 ENCOUNTER — Other Ambulatory Visit: Payer: Self-pay | Admitting: Thoracic Surgery (Cardiothoracic Vascular Surgery)

## 2020-06-22 ENCOUNTER — Other Ambulatory Visit: Payer: Self-pay

## 2020-06-22 DIAGNOSIS — D638 Anemia in other chronic diseases classified elsewhere: Secondary | ICD-10-CM | POA: Diagnosis not present

## 2020-06-22 DIAGNOSIS — Z992 Dependence on renal dialysis: Secondary | ICD-10-CM | POA: Diagnosis not present

## 2020-06-22 DIAGNOSIS — I119 Hypertensive heart disease without heart failure: Secondary | ICD-10-CM | POA: Diagnosis not present

## 2020-06-22 DIAGNOSIS — E1165 Type 2 diabetes mellitus with hyperglycemia: Secondary | ICD-10-CM | POA: Diagnosis not present

## 2020-06-22 DIAGNOSIS — E103593 Type 1 diabetes mellitus with proliferative diabetic retinopathy without macular edema, bilateral: Secondary | ICD-10-CM | POA: Diagnosis not present

## 2020-06-22 DIAGNOSIS — J90 Pleural effusion, not elsewhere classified: Secondary | ICD-10-CM | POA: Diagnosis not present

## 2020-06-22 DIAGNOSIS — I10 Essential (primary) hypertension: Secondary | ICD-10-CM | POA: Diagnosis not present

## 2020-06-22 DIAGNOSIS — Z794 Long term (current) use of insulin: Secondary | ICD-10-CM | POA: Diagnosis not present

## 2020-06-22 DIAGNOSIS — E782 Mixed hyperlipidemia: Secondary | ICD-10-CM | POA: Diagnosis not present

## 2020-06-22 DIAGNOSIS — E1022 Type 1 diabetes mellitus with diabetic chronic kidney disease: Secondary | ICD-10-CM | POA: Diagnosis not present

## 2020-06-22 DIAGNOSIS — N186 End stage renal disease: Secondary | ICD-10-CM | POA: Diagnosis not present

## 2020-06-22 HISTORY — PX: IR THORACENTESIS ASP PLEURAL SPACE W/IMG GUIDE: IMG5380

## 2020-06-22 LAB — PROTEIN, PLEURAL OR PERITONEAL FLUID: Total protein, fluid: 4.8 g/dL

## 2020-06-22 LAB — GLUCOSE, PLEURAL OR PERITONEAL FLUID: Glucose, Fluid: 277 mg/dL

## 2020-06-22 LAB — BODY FLUID CELL COUNT WITH DIFFERENTIAL
Eos, Fluid: 1 %
Lymphs, Fluid: 71 %
Monocyte-Macrophage-Serous Fluid: 13 % — ABNORMAL LOW (ref 50–90)
Neutrophil Count, Fluid: 15 % (ref 0–25)
Total Nucleated Cell Count, Fluid: 90 cu mm (ref 0–1000)

## 2020-06-22 LAB — LACTATE DEHYDROGENASE, PLEURAL OR PERITONEAL FLUID: LD, Fluid: 87 U/L — ABNORMAL HIGH (ref 3–23)

## 2020-06-22 MED ORDER — LIDOCAINE HCL 1 % IJ SOLN
INTRAMUSCULAR | Status: AC
  Filled 2020-06-22: qty 20

## 2020-06-22 MED ORDER — LIDOCAINE HCL (PF) 1 % IJ SOLN
INTRAMUSCULAR | Status: DC | PRN
  Administered 2020-06-22: 10 mL

## 2020-06-22 NOTE — Procedures (Signed)
PROCEDURE SUMMARY:  Successful US guided right thoracentesis. Yielded 1.2 L of light brown fluid. Pt tolerated procedure well. No immediate complications.  Specimen sent for labs. CXR ordered; no post-procedure pneumothorax identified  EBL < 2 mL  Theresa Duty, NP 06/22/2020 2:16 PM

## 2020-06-23 DIAGNOSIS — R110 Nausea: Secondary | ICD-10-CM | POA: Diagnosis not present

## 2020-06-23 DIAGNOSIS — N2581 Secondary hyperparathyroidism of renal origin: Secondary | ICD-10-CM | POA: Diagnosis not present

## 2020-06-23 DIAGNOSIS — D689 Coagulation defect, unspecified: Secondary | ICD-10-CM | POA: Diagnosis not present

## 2020-06-23 DIAGNOSIS — D631 Anemia in chronic kidney disease: Secondary | ICD-10-CM | POA: Diagnosis not present

## 2020-06-23 DIAGNOSIS — D509 Iron deficiency anemia, unspecified: Secondary | ICD-10-CM | POA: Diagnosis not present

## 2020-06-23 DIAGNOSIS — E1029 Type 1 diabetes mellitus with other diabetic kidney complication: Secondary | ICD-10-CM | POA: Diagnosis not present

## 2020-06-23 DIAGNOSIS — N186 End stage renal disease: Secondary | ICD-10-CM | POA: Diagnosis not present

## 2020-06-23 DIAGNOSIS — Z992 Dependence on renal dialysis: Secondary | ICD-10-CM | POA: Diagnosis not present

## 2020-06-25 LAB — PATHOLOGIST SMEAR REVIEW

## 2020-06-26 DIAGNOSIS — Z992 Dependence on renal dialysis: Secondary | ICD-10-CM | POA: Diagnosis not present

## 2020-06-26 DIAGNOSIS — E1029 Type 1 diabetes mellitus with other diabetic kidney complication: Secondary | ICD-10-CM | POA: Diagnosis not present

## 2020-06-26 DIAGNOSIS — N186 End stage renal disease: Secondary | ICD-10-CM | POA: Diagnosis not present

## 2020-06-26 DIAGNOSIS — D689 Coagulation defect, unspecified: Secondary | ICD-10-CM | POA: Diagnosis not present

## 2020-06-26 DIAGNOSIS — R110 Nausea: Secondary | ICD-10-CM | POA: Diagnosis not present

## 2020-06-26 DIAGNOSIS — N2581 Secondary hyperparathyroidism of renal origin: Secondary | ICD-10-CM | POA: Diagnosis not present

## 2020-06-26 DIAGNOSIS — D631 Anemia in chronic kidney disease: Secondary | ICD-10-CM | POA: Diagnosis not present

## 2020-06-26 DIAGNOSIS — D509 Iron deficiency anemia, unspecified: Secondary | ICD-10-CM | POA: Diagnosis not present

## 2020-06-27 DIAGNOSIS — Z992 Dependence on renal dialysis: Secondary | ICD-10-CM | POA: Diagnosis not present

## 2020-06-27 DIAGNOSIS — D689 Coagulation defect, unspecified: Secondary | ICD-10-CM | POA: Diagnosis not present

## 2020-06-27 DIAGNOSIS — R110 Nausea: Secondary | ICD-10-CM | POA: Diagnosis not present

## 2020-06-27 DIAGNOSIS — D509 Iron deficiency anemia, unspecified: Secondary | ICD-10-CM | POA: Diagnosis not present

## 2020-06-27 DIAGNOSIS — E1029 Type 1 diabetes mellitus with other diabetic kidney complication: Secondary | ICD-10-CM | POA: Diagnosis not present

## 2020-06-27 DIAGNOSIS — D631 Anemia in chronic kidney disease: Secondary | ICD-10-CM | POA: Diagnosis not present

## 2020-06-27 DIAGNOSIS — N2581 Secondary hyperparathyroidism of renal origin: Secondary | ICD-10-CM | POA: Diagnosis not present

## 2020-06-27 DIAGNOSIS — N186 End stage renal disease: Secondary | ICD-10-CM | POA: Diagnosis not present

## 2020-06-27 DIAGNOSIS — E8779 Other fluid overload: Secondary | ICD-10-CM | POA: Diagnosis not present

## 2020-06-28 DIAGNOSIS — Z992 Dependence on renal dialysis: Secondary | ICD-10-CM | POA: Diagnosis not present

## 2020-06-28 DIAGNOSIS — D509 Iron deficiency anemia, unspecified: Secondary | ICD-10-CM | POA: Diagnosis not present

## 2020-06-28 DIAGNOSIS — N2581 Secondary hyperparathyroidism of renal origin: Secondary | ICD-10-CM | POA: Diagnosis not present

## 2020-06-28 DIAGNOSIS — E1029 Type 1 diabetes mellitus with other diabetic kidney complication: Secondary | ICD-10-CM | POA: Diagnosis not present

## 2020-06-28 DIAGNOSIS — D689 Coagulation defect, unspecified: Secondary | ICD-10-CM | POA: Diagnosis not present

## 2020-06-28 DIAGNOSIS — R110 Nausea: Secondary | ICD-10-CM | POA: Diagnosis not present

## 2020-06-28 DIAGNOSIS — D631 Anemia in chronic kidney disease: Secondary | ICD-10-CM | POA: Diagnosis not present

## 2020-06-28 DIAGNOSIS — N186 End stage renal disease: Secondary | ICD-10-CM | POA: Diagnosis not present

## 2020-06-29 ENCOUNTER — Other Ambulatory Visit: Payer: Self-pay | Admitting: Thoracic Surgery (Cardiothoracic Vascular Surgery)

## 2020-06-29 DIAGNOSIS — E1022 Type 1 diabetes mellitus with diabetic chronic kidney disease: Secondary | ICD-10-CM | POA: Diagnosis not present

## 2020-06-29 DIAGNOSIS — J90 Pleural effusion, not elsewhere classified: Secondary | ICD-10-CM

## 2020-06-30 DIAGNOSIS — E1029 Type 1 diabetes mellitus with other diabetic kidney complication: Secondary | ICD-10-CM | POA: Diagnosis not present

## 2020-06-30 DIAGNOSIS — R110 Nausea: Secondary | ICD-10-CM | POA: Diagnosis not present

## 2020-06-30 DIAGNOSIS — N2581 Secondary hyperparathyroidism of renal origin: Secondary | ICD-10-CM | POA: Diagnosis not present

## 2020-06-30 DIAGNOSIS — D509 Iron deficiency anemia, unspecified: Secondary | ICD-10-CM | POA: Diagnosis not present

## 2020-06-30 DIAGNOSIS — N186 End stage renal disease: Secondary | ICD-10-CM | POA: Diagnosis not present

## 2020-06-30 DIAGNOSIS — D689 Coagulation defect, unspecified: Secondary | ICD-10-CM | POA: Diagnosis not present

## 2020-06-30 DIAGNOSIS — D631 Anemia in chronic kidney disease: Secondary | ICD-10-CM | POA: Diagnosis not present

## 2020-06-30 DIAGNOSIS — Z992 Dependence on renal dialysis: Secondary | ICD-10-CM | POA: Diagnosis not present

## 2020-07-02 ENCOUNTER — Other Ambulatory Visit: Payer: Self-pay

## 2020-07-02 ENCOUNTER — Encounter: Payer: Self-pay | Admitting: Thoracic Surgery (Cardiothoracic Vascular Surgery)

## 2020-07-02 ENCOUNTER — Ambulatory Visit (INDEPENDENT_AMBULATORY_CARE_PROVIDER_SITE_OTHER): Payer: Medicare HMO | Admitting: Thoracic Surgery (Cardiothoracic Vascular Surgery)

## 2020-07-02 ENCOUNTER — Ambulatory Visit
Admission: RE | Admit: 2020-07-02 | Discharge: 2020-07-02 | Disposition: A | Discharge status: Home / Self Care | Payer: Medicare HMO | Source: Ambulatory Visit | Attending: Thoracic Surgery (Cardiothoracic Vascular Surgery) | Admitting: Thoracic Surgery (Cardiothoracic Vascular Surgery)

## 2020-07-02 ENCOUNTER — Other Ambulatory Visit: Payer: Self-pay | Admitting: *Deleted

## 2020-07-02 VITALS — BP 144/70 | HR 75 | Resp 18 | Wt 125.2 lb

## 2020-07-02 DIAGNOSIS — J90 Pleural effusion, not elsewhere classified: Secondary | ICD-10-CM

## 2020-07-02 DIAGNOSIS — I517 Cardiomegaly: Secondary | ICD-10-CM | POA: Diagnosis not present

## 2020-07-02 NOTE — H&P (View-Only) (Signed)
[address]       ,[place] [address]             [phone]     HPI: [name] returns for follow-up of her right pleural effusion  [name] is a 44 year old woman with a history of end-stage renal failure on hemodialysis, type 1 diabetes complicated by retinopathy neuropathy, severe tricuspid regurgitation, right heart failure, hypertension, hyperlipidemia, reflux, thyromegaly, arthritis, anemia, ascites, and recurrent right pleural effusion.  She presented in November with shortness of breath and was found to have a large right pleural effusion.  She had thoracentesis which drained 900 mL of fluid and then had a pigtail catheter placed.  Her lung was incompletely reexpanded.  The fluid was borderline between transudate and exudate.  In addition she also has significant ascites.  She has an appointment scheduled with the advanced heart failure team in early January.  I saw her in the office at the end of November.  She was more short of breath and her effusion was larger.  A thoracentesis was done on 06/22/2020.  About a liter of fluid was removed.  She had symptomatic relief but her symptoms recurred quickly.  She still has problems with ascites.  Past Medical History:  Diagnosis Date  . Anemia   . Arthritis   . Ascites 03/28/2020  . Diabetes mellitus   . Diabetic retinopathy ([place])    Hx of laser Rx's  . DM type 1 (diabetes mellitus, type 1) ([place]) 09/12/2014   Age of onset for DM type 1 was age 17.     . Enlarged thyroid gland   . ESRD on hemodialysis ([place])    ESRD due to DM type I, age of onset DM 1 was age 18.  Went on dialysis in March 2012.  Gets HD now at [facility] on a TTS schedule.    Marland Kitchen ESRD on hemodialysis ([place]) 09/12/2014   ESRD due to DM type 1.  Started HD in 2012 at [facility].  Gets HD there on a TTS schedule.    Marland Kitchen GERD (gastroesophageal reflux disease)   . History of blood transfusion   . Hyperlipidemia   . Hypertension   . Peripheral  vascular disease ([place])   . Pneumonia   '  Current Outpatient Medications  Medication Sig Dispense Refill  . amLODipine (NORVASC) 10 MG tablet Take 10 mg by mouth at bedtime.    . calcium acetate (PHOSLO) 667 MG capsule Take 667 mg by mouth 3 (three) times daily with meals. May take an additional 667 mg dose with snacks    . GLUCAGON EMERGENCY 1 MG injection Inject 1 mg into the muscle once as needed (for severe low blood sugar).     . insulin aspart (NOVOLOG) 100 UNIT/ML injection Inject 50 Units into the skin See admin instructions. Use with insulin pump daily    . Insulin Human (INSULIN PUMP) SOLN Inject into the skin continuous. Humalog   Basal rate 0.29 units/ hour    . labetalol (NORMODYNE) 300 MG tablet TAKE 1 TABLET(300 MG) BY MOUTH TWICE DAILY (Patient taking differently: Take 300 mg by mouth daily. Takes once daily) 180 tablet 1  . losartan (COZAAR) 50 MG tablet Take 50 mg by mouth every evening.     . rosuvastatin (CRESTOR) 10 MG tablet Take 10 mg by mouth at bedtime.     No current facility-administered medications for this visit.    Physical Exam BP Marland Kitchen)  144/70 (BP Location: Left Arm, Patient Position: Sitting, Cuff Size: Normal) Comment (BP Location): forarm  Pulse 75   Resp 18   Wt 125 lb 3.2 oz (56.8 kg)   SpO2 (!) 89% Comment: 2L [place]  BMI 22.27 kg/m  44 year old woman in no acute distress Alert and oriented x3 with no focal deficit Wearing nasal cannula oxygen Lungs absent breath sounds right base Cardiac regular rate and rhythm with prominent systolic murmur Ascites  Diagnostic Tests: CHEST - 2 VIEW  COMPARISON:  06/22/2020  FINDINGS: Large right pleural effusion. Right middle and lower lobe opacity, likely atelectasis. Left lung is clear. No pneumothorax.  Cardiomegaly.  IMPRESSION: Large right pleural effusion.  Right middle and lower lobe opacity, likely atelectasis.   Electronically Signed   By: Julian Hy M.D.   On: 07/02/2020  10:23 I personally reviewed the chest x-ray concur with the findings noted above  Impression: [name] is a 44 year old woman with a history of end-stage renal failure on hemodialysis, type 1 diabetes complicated by retinopathy neuropathy, severe tricuspid regurgitation, right heart failure, hypertension, hyperlipidemia, reflux, thyromegaly, arthritis, anemia, ascites, and recurrent right pleural effusion.  She has a rapidly recurring right pleural effusion in the setting of end-stage renal disease, right heart failure, and ascites.  This is a complicated matter and the pleural effusion may actually be related to the ascites.  I would not be comfortable with proceeding with a VATS in this setting.  I think our best option for short-term management of the effusion would be to place a pleural catheter.  I described the procedure to her and showed her catheter.  She understands she would have an indwelling catheter that would need ongoing care.  I described the proposed procedure to her.  We will plan to do this in the operating room with local anesthesia and sedation.  We would do it on an outpatient basis.  We will arrange for home health nurse to assist with drainage of the catheter and teaching.  I informed her of the indications, risks, benefits, and alternatives.  She understands the risk include, but not limited to bleeding, pneumothorax, catheter occlusion or malposition, and infection.  She understands accepts the risks and agrees to proceed  Plan: Right pleural catheter placement on Wednesday, 07/04/2020  Melrose Nakayama, MD [facility] [phone]

## 2020-07-02 NOTE — Progress Notes (Signed)
[address]       [place] [address]             [phone]     HPI: [name] returns for follow-up of her right pleural effusion  [name] is a 44 year old woman with a history of end-stage renal failure on hemodialysis, type 1 diabetes complicated by retinopathy neuropathy, severe tricuspid regurgitation, right heart failure, hypertension, hyperlipidemia, reflux, thyromegaly, arthritis, anemia, ascites, and recurrent right pleural effusion.  She presented in November with shortness of breath and was found to have a large right pleural effusion.  She had thoracentesis which drained 900 mL of fluid and then had a pigtail catheter placed.  Her lung was incompletely reexpanded.  The fluid was borderline between transudate and exudate.  In addition she also has significant ascites.  She has an appointment scheduled with the advanced heart failure team in early January.  I saw her in the office at the end of November.  She was more short of breath and her effusion was larger.  A thoracentesis was done on 06/22/2020.  About a liter of fluid was removed.  She had symptomatic relief but her symptoms recurred quickly.  She still has problems with ascites.  Past Medical History:  Diagnosis Date  . Anemia   . Arthritis   . Ascites 03/28/2020  . Diabetes mellitus   . Diabetic retinopathy ([place])    Hx of laser Rx's  . DM type 1 (diabetes mellitus, type 1) ([place]) 09/12/2014   Age of onset for DM type 1 was age 89.     . Enlarged thyroid gland   . ESRD on hemodialysis ([place])    ESRD due to DM type I, age of onset DM 1 was age 58.  Went on dialysis in March 2012.  Gets HD now at [facility] on a TTS schedule.    Marland Kitchen ESRD on hemodialysis ([place]) 09/12/2014   ESRD due to DM type 1.  Started HD in 2012 at [facility].  Gets HD there on a TTS schedule.    Marland Kitchen GERD (gastroesophageal reflux disease)   . History of blood transfusion   . Hyperlipidemia   . Hypertension   . Peripheral  vascular disease ([place])   . Pneumonia   '  Current Outpatient Medications  Medication Sig Dispense Refill  . amLODipine (NORVASC) 10 MG tablet Take 10 mg by mouth at bedtime.    . calcium acetate (PHOSLO) 667 MG capsule Take 667 mg by mouth 3 (three) times daily with meals. May take an additional 667 mg dose with snacks    . GLUCAGON EMERGENCY 1 MG injection Inject 1 mg into the muscle once as needed (for severe low blood sugar).     . insulin aspart (NOVOLOG) 100 UNIT/ML injection Inject 50 Units into the skin See admin instructions. Use with insulin pump daily    . Insulin Human (INSULIN PUMP) SOLN Inject into the skin continuous. Humalog   Basal rate 0.29 units/ hour    . labetalol (NORMODYNE) 300 MG tablet TAKE 1 TABLET(300 MG) BY MOUTH TWICE DAILY (Patient taking differently: Take 300 mg by mouth daily. Takes once daily) 180 tablet 1  . losartan (COZAAR) 50 MG tablet Take 50 mg by mouth every evening.     . rosuvastatin (CRESTOR) 10 MG tablet Take 10 mg by mouth at bedtime.     No current facility-administered medications for this visit.    Physical Exam BP Marland Kitchen)  144/70 (BP Location: Left Arm, Patient Position: Sitting, Cuff Size: Normal) Comment (BP Location): forarm  Pulse 75   Resp 18   Wt 125 lb 3.2 oz (56.8 kg)   SpO2 (!) 89% Comment: 2L [place]  BMI 22.6 kg/m  44 year old woman in no acute distress Alert and oriented x3 with no focal deficit Wearing nasal cannula oxygen Lungs absent breath sounds right base Cardiac regular rate and rhythm with prominent systolic murmur Ascites  Diagnostic Tests: CHEST - 2 VIEW  COMPARISON:  06/22/2020  FINDINGS: Large right pleural effusion. Right middle and lower lobe opacity, likely atelectasis. Left lung is clear. No pneumothorax.  Cardiomegaly.  IMPRESSION: Large right pleural effusion.  Right middle and lower lobe opacity, likely atelectasis.   Electronically Signed   By: Julian Hy M.D.   On: 07/02/2020  10:23 I personally reviewed the chest x-ray concur with the findings noted above  Impression: [name] is a 44 year old woman with a history of end-stage renal failure on hemodialysis, type 1 diabetes complicated by retinopathy neuropathy, severe tricuspid regurgitation, right heart failure, hypertension, hyperlipidemia, reflux, thyromegaly, arthritis, anemia, ascites, and recurrent right pleural effusion.  She has a rapidly recurring right pleural effusion in the setting of end-stage renal disease, right heart failure, and ascites.  This is a complicated matter and the pleural effusion may actually be related to the ascites.  I would not be comfortable with proceeding with a VATS in this setting.  I think our best option for short-term management of the effusion would be to place a pleural catheter.  I described the procedure to her and showed her catheter.  She understands she would have an indwelling catheter that would need ongoing care.  I described the proposed procedure to her.  We will plan to do this in the operating room with local anesthesia and sedation.  We would do it on an outpatient basis.  We will arrange for home health nurse to assist with drainage of the catheter and teaching.  I informed her of the indications, risks, benefits, and alternatives.  She understands the risk include, but not limited to bleeding, pneumothorax, catheter occlusion or malposition, and infection.  She understands accepts the risks and agrees to proceed  Plan: Right pleural catheter placement on Wednesday, 07/04/2020  Melrose Nakayama, MD [facility] [phone]

## 2020-07-03 ENCOUNTER — Other Ambulatory Visit (HOSPITAL_COMMUNITY): Payer: Medicare HMO

## 2020-07-03 ENCOUNTER — Other Ambulatory Visit (HOSPITAL_COMMUNITY)
Admission: RE | Admit: 2020-07-03 | Discharge: 2020-07-03 | Disposition: A | Discharge status: Home / Self Care | Payer: Medicare HMO | Source: Ambulatory Visit | Attending: Thoracic Surgery (Cardiothoracic Vascular Surgery) | Admitting: Thoracic Surgery (Cardiothoracic Vascular Surgery)

## 2020-07-03 ENCOUNTER — Encounter (HOSPITAL_COMMUNITY): Payer: Self-pay | Admitting: Thoracic Surgery (Cardiothoracic Vascular Surgery)

## 2020-07-03 DIAGNOSIS — E1029 Type 1 diabetes mellitus with other diabetic kidney complication: Secondary | ICD-10-CM | POA: Diagnosis not present

## 2020-07-03 DIAGNOSIS — Z01812 Encounter for preprocedural laboratory examination: Secondary | ICD-10-CM | POA: Diagnosis present

## 2020-07-03 DIAGNOSIS — N186 End stage renal disease: Secondary | ICD-10-CM | POA: Diagnosis not present

## 2020-07-03 DIAGNOSIS — D631 Anemia in chronic kidney disease: Secondary | ICD-10-CM | POA: Diagnosis not present

## 2020-07-03 DIAGNOSIS — D689 Coagulation defect, unspecified: Secondary | ICD-10-CM | POA: Diagnosis not present

## 2020-07-03 DIAGNOSIS — Z20822 Contact with and (suspected) exposure to covid-19: Secondary | ICD-10-CM | POA: Insufficient documentation

## 2020-07-03 DIAGNOSIS — R110 Nausea: Secondary | ICD-10-CM | POA: Diagnosis not present

## 2020-07-03 DIAGNOSIS — D509 Iron deficiency anemia, unspecified: Secondary | ICD-10-CM | POA: Diagnosis not present

## 2020-07-03 DIAGNOSIS — N2581 Secondary hyperparathyroidism of renal origin: Secondary | ICD-10-CM | POA: Diagnosis not present

## 2020-07-03 DIAGNOSIS — Z992 Dependence on renal dialysis: Secondary | ICD-10-CM | POA: Diagnosis not present

## 2020-07-03 LAB — SARS CORONAVIRUS 2 (TAT 6-24 HRS): SARS Coronavirus 2: NEGATIVE

## 2020-07-03 NOTE — Progress Notes (Signed)
PCP:  Could not remember Cardiologist:  Adrian Prows, MD Endocrinologist:  Ferne Coe  EKG:  06/01/20 CXR: 07/02/20 ECHO: 02/13/20 Stress Test:  Denies Cardiac Cath:  Denies  Patient has insulin pump.  Instructed to decrease basal rate by 20% starting at midnight tonight. Left message for diabetiac coordinator.  OSA/CPAP:  No  ASA/Blood Thinner:  No  Anesthesia Review:  No  Patient denies shortness of breath, fever, cough, and chest pain at PAT appointment.  Patient verbalized understanding of instructions provided today at the PAT appointment.  Patient asked to review instructions at home and day of surgery.

## 2020-07-04 ENCOUNTER — Ambulatory Visit (HOSPITAL_COMMUNITY)
Admission: RE | Admit: 2020-07-04 | Discharge: 2020-07-04 | Disposition: A | Discharge status: Home / Self Care | Payer: Medicare HMO | Attending: Thoracic Surgery (Cardiothoracic Vascular Surgery) | Admitting: Thoracic Surgery (Cardiothoracic Vascular Surgery)

## 2020-07-04 ENCOUNTER — Encounter (HOSPITAL_COMMUNITY): Payer: Self-pay | Admitting: Thoracic Surgery (Cardiothoracic Vascular Surgery)

## 2020-07-04 ENCOUNTER — Ambulatory Visit (HOSPITAL_COMMUNITY): Payer: Medicare HMO | Admitting: Certified Registered"

## 2020-07-04 ENCOUNTER — Ambulatory Visit (HOSPITAL_COMMUNITY): Payer: Medicare HMO

## 2020-07-04 ENCOUNTER — Encounter (HOSPITAL_COMMUNITY)
Admission: RE | Disposition: A | Discharge status: Home / Self Care | Payer: Self-pay | Source: Home / Self Care | Attending: Thoracic Surgery (Cardiothoracic Vascular Surgery)

## 2020-07-04 ENCOUNTER — Other Ambulatory Visit: Payer: Self-pay

## 2020-07-04 DIAGNOSIS — Z794 Long term (current) use of insulin: Secondary | ICD-10-CM | POA: Insufficient documentation

## 2020-07-04 DIAGNOSIS — E785 Hyperlipidemia, unspecified: Secondary | ICD-10-CM | POA: Insufficient documentation

## 2020-07-04 DIAGNOSIS — E1051 Type 1 diabetes mellitus with diabetic peripheral angiopathy without gangrene: Secondary | ICD-10-CM | POA: Diagnosis not present

## 2020-07-04 DIAGNOSIS — E1122 Type 2 diabetes mellitus with diabetic chronic kidney disease: Secondary | ICD-10-CM | POA: Diagnosis not present

## 2020-07-04 DIAGNOSIS — R188 Other ascites: Secondary | ICD-10-CM | POA: Insufficient documentation

## 2020-07-04 DIAGNOSIS — N186 End stage renal disease: Secondary | ICD-10-CM | POA: Diagnosis not present

## 2020-07-04 DIAGNOSIS — K219 Gastro-esophageal reflux disease without esophagitis: Secondary | ICD-10-CM | POA: Insufficient documentation

## 2020-07-04 DIAGNOSIS — J90 Pleural effusion, not elsewhere classified: Secondary | ICD-10-CM | POA: Insufficient documentation

## 2020-07-04 DIAGNOSIS — Z79899 Other long term (current) drug therapy: Secondary | ICD-10-CM | POA: Insufficient documentation

## 2020-07-04 DIAGNOSIS — I120 Hypertensive chronic kidney disease with stage 5 chronic kidney disease or end stage renal disease: Secondary | ICD-10-CM | POA: Diagnosis not present

## 2020-07-04 DIAGNOSIS — I50810 Right heart failure, unspecified: Secondary | ICD-10-CM | POA: Diagnosis not present

## 2020-07-04 DIAGNOSIS — E10319 Type 1 diabetes mellitus with unspecified diabetic retinopathy without macular edema: Secondary | ICD-10-CM | POA: Diagnosis not present

## 2020-07-04 DIAGNOSIS — Z992 Dependence on renal dialysis: Secondary | ICD-10-CM | POA: Diagnosis not present

## 2020-07-04 DIAGNOSIS — E1022 Type 1 diabetes mellitus with diabetic chronic kidney disease: Secondary | ICD-10-CM | POA: Diagnosis not present

## 2020-07-04 DIAGNOSIS — I132 Hypertensive heart and chronic kidney disease with heart failure and with stage 5 chronic kidney disease, or end stage renal disease: Secondary | ICD-10-CM | POA: Diagnosis not present

## 2020-07-04 HISTORY — PX: CHEST TUBE INSERTION: SHX231

## 2020-07-04 LAB — POCT I-STAT, CHEM 8
BUN: 19 mg/dL (ref 6–20)
BUN: 36 mg/dL — ABNORMAL HIGH (ref 6–20)
Calcium, Ion: 0.85 mmol/L — CL (ref 1.15–1.40)
Calcium, Ion: 1.12 mmol/L — ABNORMAL LOW (ref 1.15–1.40)
Chloride: 114 mmol/L — ABNORMAL HIGH (ref 98–111)
Chloride: 98 mmol/L (ref 98–111)
Creatinine, Ser: 3.4 mg/dL — ABNORMAL HIGH (ref 0.44–1.00)
Creatinine, Ser: 5.7 mg/dL — ABNORMAL HIGH (ref 0.44–1.00)
Glucose, Bld: 59 mg/dL — ABNORMAL LOW (ref 70–99)
Glucose, Bld: 85 mg/dL (ref 70–99)
HCT: 20 % — ABNORMAL LOW (ref 36.0–46.0)
HCT: 29 % — ABNORMAL LOW (ref 36.0–46.0)
Hemoglobin: 6.8 g/dL — CL (ref 12.0–15.0)
Hemoglobin: 9.9 g/dL — ABNORMAL LOW (ref 12.0–15.0)
Potassium: 2.8 mmol/L — ABNORMAL LOW (ref 3.5–5.1)
Potassium: 5 mmol/L (ref 3.5–5.1)
Sodium: 147 mmol/L — ABNORMAL HIGH (ref 135–145)
Sodium: 139 mmol/L (ref 135–145)
TCO2: 18 mmol/L — ABNORMAL LOW (ref 22–32)
TCO2: 30 mmol/L (ref 22–32)

## 2020-07-04 LAB — HCG, SERUM, QUALITATIVE: Preg, Serum: NEGATIVE

## 2020-07-04 LAB — GLUCOSE, CAPILLARY: Glucose-Capillary: 103 mg/dL — ABNORMAL HIGH (ref 70–99)

## 2020-07-04 SURGERY — INSERTION, PLEURAL DRAINAGE CATHETER
Anesthesia: Monitor Anesthesia Care | Laterality: Right

## 2020-07-04 MED ORDER — CEFAZOLIN SODIUM-DEXTROSE 2-4 GM/100ML-% IV SOLN
2.0000 g | INTRAVENOUS | Status: AC
  Administered 2020-07-04: 17:00:00 2 g via INTRAVENOUS

## 2020-07-04 MED ORDER — ORAL CARE MOUTH RINSE: 15.0000 mL | Freq: Once | OROMUCOSAL | Status: DC

## 2020-07-04 MED ORDER — OXYCODONE HCL 5 MG PO TABS: 5.0000 mg | ORAL_TABLET | Freq: Four times a day (QID) | ORAL | Status: DC | PRN

## 2020-07-04 MED ORDER — OXYCODONE HCL 5 MG PO TABS
5.0000 mg | ORAL_TABLET | Freq: Four times a day (QID) | ORAL | 0 refills | Status: AC | PRN
Start: 2020-07-04 — End: 2020-07-09

## 2020-07-04 MED ORDER — ONDANSETRON HCL 4 MG/2ML IJ SOLN: 4.0000 mg | Freq: Once | INTRAMUSCULAR | Status: AC | PRN

## 2020-07-04 MED ORDER — 0.9 % SODIUM CHLORIDE (POUR BTL) OPTIME
TOPICAL | Status: DC | PRN
  Administered 2020-07-04: 16:00:00 1000 mL

## 2020-07-04 MED ORDER — LACTATED RINGERS IV SOLN: INTRAVENOUS | Status: DC

## 2020-07-04 MED ORDER — ONDANSETRON HCL 4 MG/2ML IJ SOLN
INTRAMUSCULAR | Status: DC | PRN
  Administered 2020-07-04: 4 mg via INTRAVENOUS

## 2020-07-04 MED ORDER — FENTANYL CITRATE (PF) 100 MCG/2ML IJ SOLN: 25.0000 ug | INTRAMUSCULAR | Status: DC | PRN

## 2020-07-04 MED ORDER — MIDAZOLAM HCL 2 MG/2ML IJ SOLN
INTRAMUSCULAR | Status: AC
  Filled 2020-07-04: qty 2

## 2020-07-04 MED ORDER — MIDAZOLAM HCL 5 MG/5ML IJ SOLN
INTRAMUSCULAR | Status: DC | PRN
  Administered 2020-07-04: 1 mg via INTRAVENOUS

## 2020-07-04 MED ORDER — SODIUM CHLORIDE 0.9 % IV SOLN: INTRAVENOUS | Status: DC | PRN

## 2020-07-04 MED ORDER — FENTANYL CITRATE (PF) 250 MCG/5ML IJ SOLN
INTRAMUSCULAR | Status: DC | PRN
  Administered 2020-07-04 (×3): 25 ug via INTRAVENOUS

## 2020-07-04 MED ORDER — FENTANYL CITRATE (PF) 250 MCG/5ML IJ SOLN
INTRAMUSCULAR | Status: AC
  Filled 2020-07-04: qty 5

## 2020-07-04 MED ORDER — DEXMEDETOMIDINE HCL IN NACL 400 MCG/100ML IV SOLN
0.4000 ug/kg/h | INTRAVENOUS | Status: DC
  Administered 2020-07-04: 17:00:00 1 ug/kg/h via INTRAVENOUS
  Filled 2020-07-04: qty 100

## 2020-07-04 MED ORDER — ONDANSETRON HCL 4 MG/2ML IJ SOLN
INTRAMUSCULAR | Status: AC
  Administered 2020-07-04: 19:00:00 4 mg via INTRAVENOUS
  Filled 2020-07-04: qty 2

## 2020-07-04 MED ORDER — DEXMEDETOMIDINE (PRECEDEX) IN NS 20 MCG/5ML (4 MCG/ML) IV SYRINGE
PREFILLED_SYRINGE | INTRAVENOUS | Status: DC | PRN
  Administered 2020-07-04: 8 ug via INTRAVENOUS
  Administered 2020-07-04: 12 ug via INTRAVENOUS

## 2020-07-04 MED ORDER — CHLORHEXIDINE GLUCONATE 0.12 % MT SOLN: 15.0000 mL | Freq: Once | OROMUCOSAL | Status: DC

## 2020-07-04 MED ORDER — LIDOCAINE HCL 1 % IJ SOLN
INTRAMUSCULAR | Status: DC | PRN
Start: 2020-07-04 — End: 2020-07-04
  Administered 2020-07-04: 10 mL via INTRADERMAL

## 2020-07-04 SURGICAL SUPPLY — 31 items
BLADE CLIPPER SURG (BLADE) IMPLANT
BRUSH SCRUB EZ PLAIN DRY (MISCELLANEOUS) ×6 IMPLANT
CANISTER SUCT 3000ML PPV (MISCELLANEOUS) ×3 IMPLANT
COVER SURGICAL LIGHT HANDLE (MISCELLANEOUS) ×3 IMPLANT
DERMABOND ADVANCED (GAUZE/BANDAGES/DRESSINGS) ×2
DERMABOND ADVANCED .7 DNX12 (GAUZE/BANDAGES/DRESSINGS) ×1 IMPLANT
DRAPE C-ARM 42X72 X-RAY (DRAPES) ×3 IMPLANT
DRAPE LAPAROSCOPIC ABDOMINAL (DRAPES) ×3 IMPLANT
GAUZE SPONGE 4X4 12PLY STRL LF (GAUZE/BANDAGES/DRESSINGS) ×3 IMPLANT
GLOVE BIO SURGEON STRL SZ 6.5 (GLOVE) ×2 IMPLANT
GLOVE BIO SURGEONS STRL SZ 6.5 (GLOVE) ×1
GLOVE SURG SIGNA 7.5 PF LTX (GLOVE) ×3 IMPLANT
GOWN STRL REUS W/ TWL LRG LVL3 (GOWN DISPOSABLE) ×1 IMPLANT
GOWN STRL REUS W/ TWL XL LVL3 (GOWN DISPOSABLE) ×1 IMPLANT
GOWN STRL REUS W/TWL LRG LVL3 (GOWN DISPOSABLE) ×3
GOWN STRL REUS W/TWL XL LVL3 (GOWN DISPOSABLE) ×3
KIT BASIN OR (CUSTOM PROCEDURE TRAY) ×3 IMPLANT
KIT PLEURX DRAIN CATH 1000ML (MISCELLANEOUS) ×3 IMPLANT
KIT PLEURX DRAIN CATH 15.5FR (DRAIN) ×3 IMPLANT
KIT TURNOVER KIT B (KITS) ×3 IMPLANT
NS IRRIG 1000ML POUR BTL (IV SOLUTION) ×3 IMPLANT
PACK GENERAL/GYN (CUSTOM PROCEDURE TRAY) ×3 IMPLANT
PAD ARMBOARD 7.5X6 YLW CONV (MISCELLANEOUS) ×6 IMPLANT
SET DRAINAGE LINE (MISCELLANEOUS) IMPLANT
SUT ETHILON 3 0 FSL (SUTURE) ×3 IMPLANT
SUT VIC AB 3-0 X1 27 (SUTURE) ×3 IMPLANT
SYR CONTROL 10ML LL (SYRINGE) ×3 IMPLANT
TOWEL GREEN STERILE (TOWEL DISPOSABLE) ×3 IMPLANT
TOWEL GREEN STERILE FF (TOWEL DISPOSABLE) IMPLANT
VALVE REPLACEMENT CAP (MISCELLANEOUS) IMPLANT
WATER STERILE IRR 1000ML POUR (IV SOLUTION) ×3 IMPLANT

## 2020-07-04 NOTE — Anesthesia Postprocedure Evaluation (Signed)
Anesthesia Post Note  Patient: [name]  Procedure(s) Performed: INSERTION PLEURAL DRAINAGE CATHETER (Right )     Patient location during evaluation: PACU Anesthesia Type: MAC Level of consciousness: awake and alert Pain management: pain level controlled Vital Signs Assessment: post-procedure vital signs reviewed and stable Respiratory status: spontaneous breathing, nonlabored ventilation, respiratory function stable and patient connected to nasal cannula oxygen Cardiovascular status: stable and blood pressure returned to baseline Postop Assessment: no apparent nausea or vomiting Anesthetic complications: no   No complications documented.  Last Vitals:  Vitals:   07/04/20 1615 07/04/20 1720  BP: (!) 152/65 109/68  Pulse: 71 86  Resp: 18 15  Temp: (!) 36.3 C   SpO2: 100% 99%    Last Pain:  Vitals:   07/04/20 1615  TempSrc: Oral                 Breyana Follansbee COKER

## 2020-07-04 NOTE — Interval H&P Note (Signed)
History and Physical Interval Note: C/o nausea, CBG 103 07/04/2020 3:25 PM  [name]  has presented today for surgery, with the diagnosis of RECURRENT RIGHT PLEURAL EFFUSION.  The various methods of treatment have been discussed with the patient and family. After consideration of risks, benefits and other options for treatment, the patient has consented to  Procedure(s): INSERTION PLEURAL DRAINAGE CATHETER (Right) as a surgical intervention.  The patient's history has been reviewed, patient examined, no change in status, stable for surgery.  I have reviewed the patient's chart and labs.  Questions were answered to the patient's satisfaction.     [name]

## 2020-07-04 NOTE — Anesthesia Preprocedure Evaluation (Addendum)
Anesthesia Evaluation  Patient identified by MRN, date of birth, ID band Patient awake    Reviewed: Allergy & Precautions, NPO status , Patient's Chart, lab work & pertinent test results  Airway Mallampati: II  TM Distance: >3 FB     Dental  (+) Teeth Intact, Dental Advisory Given   Pulmonary   Decreased BS on right, L. Lung clear   + decreased breath sounds      Cardiovascular hypertension,  Rhythm:Regular Rate:Normal     Neuro/Psych    GI/Hepatic   Endo/Other  diabetes  Renal/GU      Musculoskeletal   Abdominal   Peds  Hematology   Anesthesia Other Findings   Reproductive/Obstetrics                            Anesthesia Physical Anesthesia Plan  ASA: III  Anesthesia Plan: MAC   Post-op Pain Management:    Induction: Intravenous  PONV Risk Score and Plan:   Airway Management Planned: Natural Airway and Simple Face Mask  Additional Equipment:   Intra-op Plan:   Post-operative Plan:   Informed Consent: I have reviewed the patients History and Physical, chart, labs and discussed the procedure including the risks, benefits and alternatives for the proposed anesthesia with the patient or authorized representative who has indicated his/her understanding and acceptance.       Plan Discussed with:   Anesthesia Plan Comments:         Anesthesia Quick Evaluation

## 2020-07-04 NOTE — Progress Notes (Addendum)
   07/04/20 1951  TOC ED Mini Assessment  TOC Time spent with patient (minutes): 60  PING Used in TOC Assessment  (n/a)  Admission or Readmission Diverted  (n/a)  Interventions which prevented an admission or readmission [place] or Services  What brought you to the Emergency Department?  Patient is in the PACU and is being discharge with Pleurx Cath and [facility] for managment of catheter.  CM explained via PACU RN of limitations with [facility] agencies, due to Pleurx Cath managment. CM explained that we will reach out to patient tomorrow with [facility] agency that accepted the referral.  Referral sent out to [facility] and [facility] CM will follow up tomorrow  CM completed Pleurx Order form and faxed to [facility] [phone].

## 2020-07-04 NOTE — Progress Notes (Signed)
Patient sO2 86-92% on room air.  Patient on home oxygen.  Unfortunately, the portable oxygent tank she brought to the hospital is empty.  Dr. Fransisco Beau was notified and came to bedside.  He stated that as long as patient maintained sO2 of 88% and above, she was okay for discharge.  Currently patient has been able to maintain 88-92% on room air and is in no distress.

## 2020-07-04 NOTE — Anesthesia Procedure Notes (Signed)
Procedure Name: MAC Date/Time: 07/04/2020 4:53 PM Performed by: Imagene Riches, CRNA Pre-anesthesia Checklist: Patient identified, Emergency Drugs available, Suction available, Patient being monitored and Timeout performed Patient Re-evaluated:Patient Re-evaluated prior to induction Oxygen Delivery Method: Simple face mask

## 2020-07-04 NOTE — Brief Op Note (Signed)
07/04/2020  5:13 PM  PATIENT:  [name]  44 y.o. female  PRE-OPERATIVE DIAGNOSIS:  RECURRENT RIGHT PLEURAL EFFUSION  POST-OPERATIVE DIAGNOSIS:  RECURRENT RIGHT PLEURAL EFFUSION  PROCEDURE:  Procedure(s): INSERTION PLEURAL DRAINAGE CATHETER (Right)  SURGEON:  Surgeon(s) and Role:    * Melrose Nakayama, MD - Primary  PHYSICIAN ASSISTANT:   ASSISTANTS: none   ANESTHESIA:   local and IV sedation  EBL:  minimal   BLOOD ADMINISTERED:none  DRAINS: Right pleural catheter   LOCAL MEDICATIONS USED:  LIDOCAINE  and Amount: 10 ml  SPECIMEN:  Source of Specimen:  pleural fluid   DISPOSITION OF SPECIMEN:  N/A  COUNTS:  YES  TOURNIQUET:  * No tourniquets in log *  DICTATION: .Other Dictation: Dictation Number -  PLAN OF CARE: Discharge to home after PACU  PATIENT DISPOSITION:  PACU - hemodynamically stable.   Delay start of Pharmacological VTE agent (>24hrs) due to surgical blood loss or risk of bleeding: not applicable

## 2020-07-04 NOTE — Transfer of Care (Signed)
Immediate Anesthesia Transfer of Care Note  Patient: [name]  Procedure(s) Performed: INSERTION PLEURAL DRAINAGE CATHETER (Right )  Patient Location: PACU  Anesthesia Type:MAC  Level of Consciousness: oriented and drowsy  Airway & Oxygen Therapy: Patient Spontanous Breathing and Patient connected to nasal cannula oxygen  Post-op Assessment: Report given to RN  Post vital signs: Reviewed and stable  Last Vitals:  Vitals Value Taken Time  BP    Temp    Pulse 92 07/04/20 1725  Resp 16 07/04/20 1725  SpO2 91 % 07/04/20 1725  Vitals shown include unvalidated device data.  Last Pain:  Vitals:   07/04/20 1615  TempSrc: Oral         Complications: No complications documented.

## 2020-07-04 NOTE — Discharge Instructions (Addendum)
Do not drive or engage in heavy physical activity today. You may resume normal activities tomorrow. You may shower tomorrow- leave dressing in place.  Leave dressing in place until home health nurse comes to drain catheter.  Call [phone] if you develop chest pain, shortness of breath, or fever > 101F  You have a prescription for oxycodone, a narcotic pain reliever. You may use as directed. You may use acetaminophen (Tylenol) in addition to or instead of the oxycodone.  A home health nurse will be arranged to drain the catheter beginning Friday 12/17.  My office will contact you with a follow up appointment.

## 2020-07-05 ENCOUNTER — Telehealth: Payer: Self-pay | Admitting: Surgery

## 2020-07-05 ENCOUNTER — Encounter (HOSPITAL_COMMUNITY): Payer: Self-pay | Admitting: Thoracic Surgery (Cardiothoracic Vascular Surgery)

## 2020-07-05 DIAGNOSIS — E1029 Type 1 diabetes mellitus with other diabetic kidney complication: Secondary | ICD-10-CM | POA: Diagnosis not present

## 2020-07-05 DIAGNOSIS — R110 Nausea: Secondary | ICD-10-CM | POA: Diagnosis not present

## 2020-07-05 DIAGNOSIS — D689 Coagulation defect, unspecified: Secondary | ICD-10-CM | POA: Diagnosis not present

## 2020-07-05 DIAGNOSIS — D509 Iron deficiency anemia, unspecified: Secondary | ICD-10-CM | POA: Diagnosis not present

## 2020-07-05 DIAGNOSIS — Z992 Dependence on renal dialysis: Secondary | ICD-10-CM | POA: Diagnosis not present

## 2020-07-05 DIAGNOSIS — N186 End stage renal disease: Secondary | ICD-10-CM | POA: Diagnosis not present

## 2020-07-05 DIAGNOSIS — N2581 Secondary hyperparathyroidism of renal origin: Secondary | ICD-10-CM | POA: Diagnosis not present

## 2020-07-05 DIAGNOSIS — D631 Anemia in chronic kidney disease: Secondary | ICD-10-CM | POA: Diagnosis not present

## 2020-07-05 NOTE — Op Note (Signed)
NAME: [name], [name] MEDICAL RECORD [id number] ACCOUNT [id number] DATE OF BIRTH:06/12/1976 FACILITY: [facility] LOCATION: [facility] PHYSICIAN:Kenzie Flakes Chaya Jan, MD  OPERATIVE REPORT  DATE OF PROCEDURE:  07/04/2020  PREOPERATIVE DIAGNOSIS:  Recurrent right pleural effusion.  POSTOPERATIVE DIAGNOSIS:  Recurrent right pleural effusion.  PROCEDURE:  Right pleural catheter placement.  SURGEON:  Modesto Charon, MD  ASSISTANT:  None.  ANESTHESIA:  Local with intravenous sedation.  FINDINGS:  Catheter in good position.  One liter of serous fluid drained without difficulty.  CLINICAL NOTE:  [name] is a 44 year old woman with a history of heart failure and end-stage renal disease.  She has had recurrent right pleural effusion.  She was advised to have a pleural catheter placement as a temporizing measure until her heart  failure can be optimized.  The indications, risks, benefits, and alternatives were discussed in detail with the patient.  She understood and accepted the risks and agreed to proceed.  DESCRIPTION OF PROCEDURE:  [name] was brought to the operating room on 07/04/2020.  She was given intravenous sedation and monitored by anesthesia.  Intravenous antibiotics were administered.  She was placed in a semirecumbent position with the bump  under the right chest.  The right chest and upper abdomen were prepped and draped in the usual sterile fashion.  A timeout was performed.  The sites for the incisions were marked and the operative site was anesthetized with 10 mL of 1% lidocaine.  After ensuring adequate local anesthetic effect, incisions were made for the insertion site as well as the exit site  for the catheter.  The pleural space was accessed using modified Seldinger technique and position of the wire was confirmed with fluoroscopy.  The catheter then was tunneled from the exit site to the insertion site.  The tract over the wire was dilated  and the catheter was  advanced into the chest and hooked to suction.  One liter of serous fluid was evacuated.  Fluoroscopy confirmed position of the catheter and decrease in the size of the pleural effusion.  The total radiation dose was 0.83 milligray.   The insertion incision was closed with a 3-0 Vicryl subcuticular suture.  The catheter was secured at the exit site with a 3-0 nylon suture.  After draining, the catheter was capped and then dressed in standard fashion.  The patient was taken from the  operating room to the [facility] in good condition.  HN/NUANCE  D:07/04/2020 T:07/05/2020 JOB:[id number]

## 2020-07-05 NOTE — Telephone Encounter (Signed)
ED RNCM faxed [facility] referral to [facility]

## 2020-07-06 ENCOUNTER — Ambulatory Visit (INDEPENDENT_AMBULATORY_CARE_PROVIDER_SITE_OTHER): Payer: Medicare HMO | Admitting: *Deleted

## 2020-07-06 ENCOUNTER — Other Ambulatory Visit: Payer: Self-pay

## 2020-07-06 DIAGNOSIS — Z4802 Encounter for removal of sutures: Secondary | ICD-10-CM

## 2020-07-06 DIAGNOSIS — J90 Pleural effusion, not elsewhere classified: Secondary | ICD-10-CM

## 2020-07-06 NOTE — Progress Notes (Signed)
Ms. Hinzman arrived for nurse visit to drain newly placed PleurX catheter. 800cc of clear amber fluid drained from right pleural catheter. Pt showed signs of pain once drainage slowed but quickly resolved post drainage. Oxygen saturation maintained above 93% on pt's personal pulse oximeter. Education on how to drain catheter provided. Clean dressing placed over site and catheter. Pt will return Monday to be re-drained.

## 2020-07-07 DIAGNOSIS — R110 Nausea: Secondary | ICD-10-CM | POA: Diagnosis not present

## 2020-07-07 DIAGNOSIS — D509 Iron deficiency anemia, unspecified: Secondary | ICD-10-CM | POA: Diagnosis not present

## 2020-07-07 DIAGNOSIS — N2581 Secondary hyperparathyroidism of renal origin: Secondary | ICD-10-CM | POA: Diagnosis not present

## 2020-07-07 DIAGNOSIS — N186 End stage renal disease: Secondary | ICD-10-CM | POA: Diagnosis not present

## 2020-07-07 DIAGNOSIS — D631 Anemia in chronic kidney disease: Secondary | ICD-10-CM | POA: Diagnosis not present

## 2020-07-07 DIAGNOSIS — E1029 Type 1 diabetes mellitus with other diabetic kidney complication: Secondary | ICD-10-CM | POA: Diagnosis not present

## 2020-07-07 DIAGNOSIS — D689 Coagulation defect, unspecified: Secondary | ICD-10-CM | POA: Diagnosis not present

## 2020-07-07 DIAGNOSIS — Z992 Dependence on renal dialysis: Secondary | ICD-10-CM | POA: Diagnosis not present

## 2020-07-09 ENCOUNTER — Ambulatory Visit (INDEPENDENT_AMBULATORY_CARE_PROVIDER_SITE_OTHER): Payer: Medicare HMO

## 2020-07-09 ENCOUNTER — Other Ambulatory Visit: Payer: Self-pay

## 2020-07-09 DIAGNOSIS — J90 Pleural effusion, not elsewhere classified: Secondary | ICD-10-CM | POA: Diagnosis not present

## 2020-07-09 DIAGNOSIS — Z719 Counseling, unspecified: Secondary | ICD-10-CM | POA: Diagnosis not present

## 2020-07-09 DIAGNOSIS — Z5189 Encounter for other specified aftercare: Secondary | ICD-10-CM

## 2020-07-09 NOTE — Progress Notes (Signed)
Patient arrived for PleurX catheter drainage.  She was last drained Friday in the office.  She is s/p PleurX catheter insertion with Dr. Roxan Hockey 07/04/20.  Drained in the office today, slow drainage, with lots of air bubbles. Patient stated that she did well drainage on Friday with no shortness of breath over the weekend.  Due to holiday schedule, patient will come back to the office again Thursday, 07/12/20 to be drained, then back on Monday, 07/16/20.  If she becomes short of breath in the meantime, she is to contact the office for additional drainage.  She acknowledged receipt.

## 2020-07-10 DIAGNOSIS — N186 End stage renal disease: Secondary | ICD-10-CM | POA: Diagnosis not present

## 2020-07-10 DIAGNOSIS — D509 Iron deficiency anemia, unspecified: Secondary | ICD-10-CM | POA: Diagnosis not present

## 2020-07-10 DIAGNOSIS — E1029 Type 1 diabetes mellitus with other diabetic kidney complication: Secondary | ICD-10-CM | POA: Diagnosis not present

## 2020-07-10 DIAGNOSIS — N2581 Secondary hyperparathyroidism of renal origin: Secondary | ICD-10-CM | POA: Diagnosis not present

## 2020-07-10 DIAGNOSIS — Z992 Dependence on renal dialysis: Secondary | ICD-10-CM | POA: Diagnosis not present

## 2020-07-10 DIAGNOSIS — D631 Anemia in chronic kidney disease: Secondary | ICD-10-CM | POA: Diagnosis not present

## 2020-07-10 DIAGNOSIS — D689 Coagulation defect, unspecified: Secondary | ICD-10-CM | POA: Diagnosis not present

## 2020-07-10 DIAGNOSIS — R110 Nausea: Secondary | ICD-10-CM | POA: Diagnosis not present

## 2020-07-12 ENCOUNTER — Ambulatory Visit (INDEPENDENT_AMBULATORY_CARE_PROVIDER_SITE_OTHER): Payer: Medicare HMO

## 2020-07-12 ENCOUNTER — Other Ambulatory Visit: Payer: Self-pay

## 2020-07-12 DIAGNOSIS — Z5189 Encounter for other specified aftercare: Secondary | ICD-10-CM

## 2020-07-12 DIAGNOSIS — N186 End stage renal disease: Secondary | ICD-10-CM | POA: Diagnosis not present

## 2020-07-12 DIAGNOSIS — D689 Coagulation defect, unspecified: Secondary | ICD-10-CM | POA: Diagnosis not present

## 2020-07-12 DIAGNOSIS — N2581 Secondary hyperparathyroidism of renal origin: Secondary | ICD-10-CM | POA: Diagnosis not present

## 2020-07-12 DIAGNOSIS — D631 Anemia in chronic kidney disease: Secondary | ICD-10-CM | POA: Diagnosis not present

## 2020-07-12 DIAGNOSIS — Z4802 Encounter for removal of sutures: Secondary | ICD-10-CM

## 2020-07-12 DIAGNOSIS — Z992 Dependence on renal dialysis: Secondary | ICD-10-CM | POA: Diagnosis not present

## 2020-07-12 DIAGNOSIS — E1029 Type 1 diabetes mellitus with other diabetic kidney complication: Secondary | ICD-10-CM | POA: Diagnosis not present

## 2020-07-12 DIAGNOSIS — D509 Iron deficiency anemia, unspecified: Secondary | ICD-10-CM | POA: Diagnosis not present

## 2020-07-12 DIAGNOSIS — R110 Nausea: Secondary | ICD-10-CM | POA: Diagnosis not present

## 2020-07-12 NOTE — Progress Notes (Signed)
Patient arrived for PleurX catheter drainage.  She was last drained Monday in the office.  She is s/p PleurX catheter insertion with Dr. Roxan Hockey 07/04/20.  Drained in the office today, slow drainage, with lots of air bubbles. Patient stated that she did well with drainage on Monday with no shortness of breath.  Due to holiday schedule, patient will come back to the office again Monday, 07/16/20 to be drained.  If she becomes short of breath in the meantime, she is to contact the office for additional instructions.  She acknowledged receipt

## 2020-07-15 DIAGNOSIS — Z992 Dependence on renal dialysis: Secondary | ICD-10-CM | POA: Diagnosis not present

## 2020-07-15 DIAGNOSIS — R110 Nausea: Secondary | ICD-10-CM | POA: Diagnosis not present

## 2020-07-15 DIAGNOSIS — N2581 Secondary hyperparathyroidism of renal origin: Secondary | ICD-10-CM | POA: Diagnosis not present

## 2020-07-15 DIAGNOSIS — D689 Coagulation defect, unspecified: Secondary | ICD-10-CM | POA: Diagnosis not present

## 2020-07-15 DIAGNOSIS — N186 End stage renal disease: Secondary | ICD-10-CM | POA: Diagnosis not present

## 2020-07-15 DIAGNOSIS — D509 Iron deficiency anemia, unspecified: Secondary | ICD-10-CM | POA: Diagnosis not present

## 2020-07-15 DIAGNOSIS — E1029 Type 1 diabetes mellitus with other diabetic kidney complication: Secondary | ICD-10-CM | POA: Diagnosis not present

## 2020-07-15 DIAGNOSIS — D631 Anemia in chronic kidney disease: Secondary | ICD-10-CM | POA: Diagnosis not present

## 2020-07-16 ENCOUNTER — Other Ambulatory Visit: Payer: Self-pay

## 2020-07-16 ENCOUNTER — Ambulatory Visit (INDEPENDENT_AMBULATORY_CARE_PROVIDER_SITE_OTHER): Payer: Medicare HMO

## 2020-07-16 DIAGNOSIS — Z5189 Encounter for other specified aftercare: Secondary | ICD-10-CM

## 2020-07-16 DIAGNOSIS — Z719 Counseling, unspecified: Secondary | ICD-10-CM

## 2020-07-16 DIAGNOSIS — J90 Pleural effusion, not elsewhere classified: Secondary | ICD-10-CM | POA: Diagnosis not present

## 2020-07-16 NOTE — Progress Notes (Signed)
Drained right Pleurx today in office. The amount was 850 cc's of yellow pleural fluid. Patient tolerated well.  She will come back on Thursday  07/19/20 to drain again.

## 2020-07-17 DIAGNOSIS — D509 Iron deficiency anemia, unspecified: Secondary | ICD-10-CM | POA: Diagnosis not present

## 2020-07-17 DIAGNOSIS — N2581 Secondary hyperparathyroidism of renal origin: Secondary | ICD-10-CM | POA: Diagnosis not present

## 2020-07-17 DIAGNOSIS — R110 Nausea: Secondary | ICD-10-CM | POA: Diagnosis not present

## 2020-07-17 DIAGNOSIS — E1029 Type 1 diabetes mellitus with other diabetic kidney complication: Secondary | ICD-10-CM | POA: Diagnosis not present

## 2020-07-17 DIAGNOSIS — N186 End stage renal disease: Secondary | ICD-10-CM | POA: Diagnosis not present

## 2020-07-17 DIAGNOSIS — D631 Anemia in chronic kidney disease: Secondary | ICD-10-CM | POA: Diagnosis not present

## 2020-07-17 DIAGNOSIS — Z992 Dependence on renal dialysis: Secondary | ICD-10-CM | POA: Diagnosis not present

## 2020-07-17 DIAGNOSIS — D689 Coagulation defect, unspecified: Secondary | ICD-10-CM | POA: Diagnosis not present

## 2020-07-19 ENCOUNTER — Ambulatory Visit (INDEPENDENT_AMBULATORY_CARE_PROVIDER_SITE_OTHER): Payer: Medicare HMO | Admitting: *Deleted

## 2020-07-19 ENCOUNTER — Other Ambulatory Visit: Payer: Self-pay

## 2020-07-19 VITALS — BP 136/74 | HR 90 | Resp 20 | Ht 63.0 in | Wt 127.0 lb

## 2020-07-19 DIAGNOSIS — D631 Anemia in chronic kidney disease: Secondary | ICD-10-CM | POA: Diagnosis not present

## 2020-07-19 DIAGNOSIS — D509 Iron deficiency anemia, unspecified: Secondary | ICD-10-CM | POA: Diagnosis not present

## 2020-07-19 DIAGNOSIS — J90 Pleural effusion, not elsewhere classified: Secondary | ICD-10-CM

## 2020-07-19 DIAGNOSIS — Z992 Dependence on renal dialysis: Secondary | ICD-10-CM | POA: Diagnosis not present

## 2020-07-19 DIAGNOSIS — Z719 Counseling, unspecified: Secondary | ICD-10-CM

## 2020-07-19 DIAGNOSIS — N186 End stage renal disease: Secondary | ICD-10-CM | POA: Diagnosis not present

## 2020-07-19 DIAGNOSIS — E1029 Type 1 diabetes mellitus with other diabetic kidney complication: Secondary | ICD-10-CM | POA: Diagnosis not present

## 2020-07-19 DIAGNOSIS — R110 Nausea: Secondary | ICD-10-CM | POA: Diagnosis not present

## 2020-07-19 DIAGNOSIS — Z5189 Encounter for other specified aftercare: Secondary | ICD-10-CM

## 2020-07-19 DIAGNOSIS — N2581 Secondary hyperparathyroidism of renal origin: Secondary | ICD-10-CM | POA: Diagnosis not present

## 2020-07-19 DIAGNOSIS — D689 Coagulation defect, unspecified: Secondary | ICD-10-CM | POA: Diagnosis not present

## 2020-07-19 NOTE — Progress Notes (Signed)
Patient arrived for right PleurX catheter drainage. Pt was last drained on Monday 12/27. Catheter today drained 1000cc of clear, yellow fluid. Two bottles were used r/t tissue clogging first bottle. No difficulty experienced today when draining. Pt denied any pain or SOB. Pt to return to be drained on Monday, January 3.

## 2020-07-20 DIAGNOSIS — Z992 Dependence on renal dialysis: Secondary | ICD-10-CM | POA: Diagnosis not present

## 2020-07-20 DIAGNOSIS — E1029 Type 1 diabetes mellitus with other diabetic kidney complication: Secondary | ICD-10-CM | POA: Diagnosis not present

## 2020-07-20 DIAGNOSIS — N186 End stage renal disease: Secondary | ICD-10-CM | POA: Diagnosis not present

## 2020-07-21 DIAGNOSIS — R0902 Hypoxemia: Secondary | ICD-10-CM | POA: Diagnosis not present

## 2020-07-21 DIAGNOSIS — R0600 Dyspnea, unspecified: Secondary | ICD-10-CM | POA: Diagnosis not present

## 2020-07-22 DIAGNOSIS — D689 Coagulation defect, unspecified: Secondary | ICD-10-CM | POA: Diagnosis not present

## 2020-07-22 DIAGNOSIS — D631 Anemia in chronic kidney disease: Secondary | ICD-10-CM | POA: Diagnosis not present

## 2020-07-22 DIAGNOSIS — E1029 Type 1 diabetes mellitus with other diabetic kidney complication: Secondary | ICD-10-CM | POA: Diagnosis not present

## 2020-07-22 DIAGNOSIS — D509 Iron deficiency anemia, unspecified: Secondary | ICD-10-CM | POA: Diagnosis not present

## 2020-07-22 DIAGNOSIS — N2581 Secondary hyperparathyroidism of renal origin: Secondary | ICD-10-CM | POA: Diagnosis not present

## 2020-07-22 DIAGNOSIS — N186 End stage renal disease: Secondary | ICD-10-CM | POA: Diagnosis not present

## 2020-07-22 DIAGNOSIS — Z992 Dependence on renal dialysis: Secondary | ICD-10-CM | POA: Diagnosis not present

## 2020-07-22 DIAGNOSIS — R739 Hyperglycemia, unspecified: Secondary | ICD-10-CM | POA: Diagnosis not present

## 2020-07-23 ENCOUNTER — Other Ambulatory Visit: Payer: Self-pay

## 2020-07-23 ENCOUNTER — Ambulatory Visit (INDEPENDENT_AMBULATORY_CARE_PROVIDER_SITE_OTHER): Payer: Medicare HMO | Admitting: *Deleted

## 2020-07-23 VITALS — BP 120/74 | HR 83 | Resp 20

## 2020-07-23 DIAGNOSIS — J90 Pleural effusion, not elsewhere classified: Secondary | ICD-10-CM

## 2020-07-23 DIAGNOSIS — Z5189 Encounter for other specified aftercare: Secondary | ICD-10-CM

## 2020-07-23 DIAGNOSIS — Z719 Counseling, unspecified: Secondary | ICD-10-CM

## 2020-07-23 NOTE — Progress Notes (Signed)
Pt arrived today for drainage of right Pleurx catheter placed 07/04/20. Pt denies any SOB or other difficulties over the weekend. 1000cc of clear yellow fluid drained at today's visit. Pt scheduled to come back on Thursday for next drainage.

## 2020-07-24 DIAGNOSIS — D631 Anemia in chronic kidney disease: Secondary | ICD-10-CM | POA: Diagnosis not present

## 2020-07-24 DIAGNOSIS — D689 Coagulation defect, unspecified: Secondary | ICD-10-CM | POA: Diagnosis not present

## 2020-07-24 DIAGNOSIS — N2581 Secondary hyperparathyroidism of renal origin: Secondary | ICD-10-CM | POA: Diagnosis not present

## 2020-07-24 DIAGNOSIS — Z992 Dependence on renal dialysis: Secondary | ICD-10-CM | POA: Diagnosis not present

## 2020-07-24 DIAGNOSIS — E1029 Type 1 diabetes mellitus with other diabetic kidney complication: Secondary | ICD-10-CM | POA: Diagnosis not present

## 2020-07-24 DIAGNOSIS — N186 End stage renal disease: Secondary | ICD-10-CM | POA: Diagnosis not present

## 2020-07-24 DIAGNOSIS — D509 Iron deficiency anemia, unspecified: Secondary | ICD-10-CM | POA: Diagnosis not present

## 2020-07-24 DIAGNOSIS — R739 Hyperglycemia, unspecified: Secondary | ICD-10-CM | POA: Diagnosis not present

## 2020-07-26 ENCOUNTER — Ambulatory Visit (INDEPENDENT_AMBULATORY_CARE_PROVIDER_SITE_OTHER): Payer: Medicare HMO

## 2020-07-26 ENCOUNTER — Other Ambulatory Visit: Payer: Self-pay

## 2020-07-26 DIAGNOSIS — Z719 Counseling, unspecified: Secondary | ICD-10-CM | POA: Diagnosis not present

## 2020-07-26 DIAGNOSIS — N2581 Secondary hyperparathyroidism of renal origin: Secondary | ICD-10-CM | POA: Diagnosis not present

## 2020-07-26 DIAGNOSIS — E1029 Type 1 diabetes mellitus with other diabetic kidney complication: Secondary | ICD-10-CM | POA: Diagnosis not present

## 2020-07-26 DIAGNOSIS — D689 Coagulation defect, unspecified: Secondary | ICD-10-CM | POA: Diagnosis not present

## 2020-07-26 DIAGNOSIS — J90 Pleural effusion, not elsewhere classified: Secondary | ICD-10-CM

## 2020-07-26 DIAGNOSIS — R739 Hyperglycemia, unspecified: Secondary | ICD-10-CM | POA: Diagnosis not present

## 2020-07-26 DIAGNOSIS — D509 Iron deficiency anemia, unspecified: Secondary | ICD-10-CM | POA: Diagnosis not present

## 2020-07-26 DIAGNOSIS — D631 Anemia in chronic kidney disease: Secondary | ICD-10-CM | POA: Diagnosis not present

## 2020-07-26 DIAGNOSIS — Z992 Dependence on renal dialysis: Secondary | ICD-10-CM | POA: Diagnosis not present

## 2020-07-26 DIAGNOSIS — N186 End stage renal disease: Secondary | ICD-10-CM | POA: Diagnosis not present

## 2020-07-26 DIAGNOSIS — Z5189 Encounter for other specified aftercare: Secondary | ICD-10-CM

## 2020-07-26 NOTE — Progress Notes (Signed)
ADVANCED HF CLINIC CONSULT NOTE  Referring Physician: Dr. Roxan Hockey Primary Care: Trey Sailors, [place] Primary Cardiologist: None Nephrology: Dr. Louie Boston Vascular Surgeon: Dr. Scot Dock HF Cardiologist: Dr. Haroldine Laws  HPI:  [name] is a 45 year old woman with ESRD on HD, type 1 diabetes complicated by retinopathy neuropathy, HTN, HL and severe right heart failure with ascites and recurrent right pleural effusion, referred by Dr. Roxan Hockey for further evaluation of her RV failure.   Admitted in 7/21 with PNA and sepsis. Had positive BCx. Underwent TEE that showed severe RV failure with severe TR. No vegetation.   Echo 7/21: EF 55-60% mild LVH. G2 DD. RV mildly dilated with normal function. Severe TR. RVSP ~ 2mmHG  TEE 7/21: EF 60-65% RV severely enlarged and moderately HK. Severe TR. No vegetation,.   Cardiology follow up schedule but not seen.  Found to have a large right pleural effusion in 11/21.  She had thoracentesis which drained 900 mL of fluid and then had a pigtail catheter placed.  Her lung was incompletely reexpanded.  The fluid was borderline between transudate and exudate. In addition she also has significant ascites.  A repeat thoracentesis was done on 06/22/2020.   She was seen by Dr. Roxan Hockey and referred here.  Abdominal swelling started in Spring 2021, around same time she had fistula revision in right arm. Dr. Roxan Hockey placed PleurX last month. She will goes twice a week to have fluid drained. Last drain yielding 700 ml, down from 1000 ml previously. Before drain placed, constantly SOB and walked with 2L oxygen. Now, feels breathing is improved. Wearing oxygen at night occasionally now. Uses Smart Mask oxygen prn when grocery shopping or walking further distances. Continues to have abdominal distension which is not ameliorated with HD, HD-dependent since 2011. She tried to have paracentesis in summer 2021 but was told not enough fluid to tap. HD  T/TH/Sat. Removing ~2.3L during HD. [facility] manages insulin pump. Does not work, lives with 101 year-old daughter.    Review of Systems: [y] = yes, [ ]  = no   General: Weight gain [ ] ; Weight loss [ ] ; Anorexia [ ] ; Fatigue Valu.Nieves ]; Fever [ ] ; Chills [ ] ; Weakness [ ]   Cardiac: Chest pain/pressure [ ] ; Resting SOB [ ] ; Exertional SOB [X] ; Orthopnea [ X]; Pedal Edema [ ] ; Palpitations [ ] ; Syncope [ ] ; Presyncope [ ] ; Paroxysmal nocturnal dyspnea[ ]   Pulmonary: Cough [ ] ; Wheezing[ ] ; Hemoptysis[ ] ; Sputum [ ] ; Snoring [ ]   GI: Vomiting[ ] ; Dysphagia[ ] ; Melena[ ] ; Hematochezia [ ] ; Heartburn[X ]; Abdominal pain [ ] ; Constipation [ ] ; Diarrhea [ ] ; BRBPR [ ]  Distention [X]  GU: Hematuria[ ] ; Dysuria [ ] ; Nocturia[ ]   Vascular: Pain in legs with walking [ ] ; Pain in feet with lying flat [ ] ; Non-healing sores [ ] ; Stroke [ ] ; TIA [ ] ; Slurred speech [ ] ;  Neuro: Headaches[ ] ; Vertigo[ ] ; Seizures[ ] ; Paresthesias[ ] ;Blurred vision [ ] ; Diplopia [ ] ; Vision changes [ ]   Ortho/Skin: Arthritis [ ] ; Joint pain [ ] ; Muscle pain [ ] ; Joint swelling [ ] ; Back Pain [ ] ; Rash [ ]   Psych: Depression[ ] ; Anxiety[ ]   Heme: Bleeding problems [ ] ; Clotting disorders [ ] ; Anemia Valu.Nieves ]  Endocrine: Diabetes Valu.Nieves ]; Thyroid dysfunction[X ]   Past Medical History:  Diagnosis Date  . Anemia   . Arthritis   . Ascites 03/28/2020  . Diabetes mellitus   . Diabetic retinopathy ([place])    Hx of laser  Rx's  . DM type 1 (diabetes mellitus, type 1) ([place]) 09/12/2014   Age of onset for DM type 1 was age 34.     . Enlarged thyroid gland   . ESRD on hemodialysis ([place])    ESRD due to DM type I, age of onset DM 1 was age 62.  Went on dialysis in March 2012.  Gets HD now at [facility] on a TTS schedule.    Marland Kitchen ESRD on hemodialysis ([place]) 09/12/2014   ESRD due to DM type 1.  Started HD in 2012 at [facility].  Gets HD there on a TTS schedule.    Marland Kitchen GERD (gastroesophageal reflux disease)   . History of blood transfusion   . Hyperlipidemia    . Hypertension   . Peripheral vascular disease ([place])   . Pneumonia     Current Outpatient Medications  Medication Sig Dispense Refill  . amLODipine (NORVASC) 10 MG tablet Take 10 mg by mouth at bedtime.    Marland Kitchen GLUCAGON EMERGENCY 1 MG injection Inject 1 mg into the muscle once as needed (for severe low blood sugar).     . Insulin Human (INSULIN PUMP) SOLN Inject into the skin continuous. Humalog   Basal rate 0.29 units/ hour    . labetalol (NORMODYNE) 300 MG tablet Take 300 mg by mouth once.    Marland Kitchen losartan (COZAAR) 50 MG tablet Take 50 mg by mouth at bedtime.     No current facility-administered medications for this encounter.    Allergies  Allergen Reactions  . Pollen Extract Other (See Comments)    Watery eyes      Social History   Socioeconomic History  . Marital status: Single    Spouse name: Not on file  . Number of children: 1  . Years of education: BACHELOR'S  . Highest education level: Not on file  Occupational History  . Not on file  Tobacco Use  . Smoking status: Never Smoker  . Smokeless tobacco: Never Used  Vaping Use  . Vaping Use: Never used  Substance and Sexual Activity  . Alcohol use: No    Alcohol/week: 0.0 standard drinks  . Drug use: No  . Sexual activity: Not Currently  Other Topics Concern  . Not on file  Social History Narrative   ** Merged History Encounter **       Patient is single with 1 child. Patient is right handed. Patient has BS degree. Patient drinks 0 caffeine.   Social Determinants of Health   Financial Resource Strain: Not on file  Food Insecurity: Not on file  Transportation Needs: Not on file  Physical Activity: Not on file  Stress: Not on file  Social Connections: Not on file  Intimate Partner Violence: Not on file      Family History  Problem Relation Age of Onset  . Cancer Mother        breast  . Hypertension Father    Wt Readings from Last 3 Encounters:  07/27/20 55.6 kg (122 lb 9.6 oz)  07/19/20 57.6 kg  (127 lb)  07/02/20 56.8 kg (125 lb 3.2 oz)    Vitals:   07/27/20 1209  BP: 130/80  Pulse: 82  SpO2: 98%  Weight: 55.6 kg (122 lb 9.6 oz)    PHYSICAL EXAM: General:  Thin, frail- appearing AAF. No respiratory difficulty. HEENT: Normal Neck: Supple. JVP jaw, v waves. Carotids 2+ bilat; no bruits. No lymphadenopathy or thryomegaly appreciated. Cor: PMI nondisplaced. Regular rate & rhythm. No rubs,  gallops, +TR. Lungs: Clear Abdomen: Distended, nontender. +hepatomegaly. No bruits or masses. Good bowel sounds. PleurX RUQ, dressing CDI. Extremities: No cyanosis, clubbing, rash, 2+ bilateral lower extremity edema. Failed LUE fistula, RUE fistula +bruit/thrill. Neuro: Alert & oriented x 3, cranial nerves grossly intact. moves all 4 extremities w/o difficulty. Affect pleasant.  ASSESSMENT & PLAN:  1. PAH with Cor Pulmonale with ascites and recurrent R pleural effusion. - Echo 7/21: EF 55-60% mild LVH. G2 DD. RV mildly dilated with normal function. Severe TR. RVSP ~ 5mmHG - TEE 7/21: EF 60-65% RV severely enlarged and moderately HK. Severe TR. No vegetation. - WHO Group I PAH in setting of ESRD possible but given time course of symptoms (following AVF extension) more likely high output failure from AVF.  - Will need RHC. - I have d/w Drs. G[place]borough and Dr. Scot Dock. In cath lab with occlude fistulae in stepwise fashion with BP cuff and assess hemodynamic response. (Give heparin 2500u at the time) - Will also check VQ and serologies.  2. Recurrent pleural effusion - Secondary to #1 - Much improved s/p Pleurex tube placement with Dr. Roxan Hockey.  3. DM1 - per PCP.  4. ESRD on HD - per Renal.  Total time spent 45 minutes. Over half that time spent discussing above.    Glori Bickers, MD  12:14 PM

## 2020-07-26 NOTE — H&P (View-Only) (Signed)
ADVANCED HF CLINIC CONSULT NOTE  Referring Physician: Dr. Roxan Hockey Primary Care: Trey Sailors, [place] Primary Cardiologist: None Nephrology: Dr. Louie Boston Vascular Surgeon: Dr. Scot Dock HF Cardiologist: Dr. Haroldine Laws  HPI:  [name] is a 45 year old woman with ESRD on HD, type 1 diabetes complicated by retinopathy neuropathy, HTN, HL and severe right heart failure with ascites and recurrent right pleural effusion, referred by Dr. Roxan Hockey for further evaluation of her RV failure.   Admitted in 7/21 with PNA and sepsis. Had positive BCx. Underwent TEE that showed severe RV failure with severe TR. No vegetation.   Echo 7/21: EF 55-60% mild LVH. G2 DD. RV mildly dilated with normal function. Severe TR. RVSP ~ 42mmHG  TEE 7/21: EF 60-65% RV severely enlarged and moderately HK. Severe TR. No vegetation,.   Cardiology follow up schedule but not seen.  Found to have a large right pleural effusion in 11/21.  She had thoracentesis which drained 900 mL of fluid and then had a pigtail catheter placed.  Her lung was incompletely reexpanded.  The fluid was borderline between transudate and exudate. In addition she also has significant ascites.  A repeat thoracentesis was done on 06/22/2020.   She was seen by Dr. Roxan Hockey and referred here.  Abdominal swelling started in Spring 2021, around same time she had fistula revision in right arm. Dr. Roxan Hockey placed PleurX last month. She will goes twice a week to have fluid drained. Last drain yielding 700 ml, down from 1000 ml previously. Before drain placed, constantly SOB and walked with 2L oxygen. Now, feels breathing is improved. Wearing oxygen at night occasionally now. Uses Smart Mask oxygen prn when grocery shopping or walking further distances. Continues to have abdominal distension which is not ameliorated with HD, HD-dependent since 2011. She tried to have paracentesis in summer 2021 but was told not enough fluid to tap. HD  T/TH/Sat. Removing ~2.3L during HD. [facility] manages insulin pump. Does not work, lives with 15 year-old daughter.    Review of Systems: [y] = yes, [ ]  = no   General: Weight gain [ ] ; Weight loss [ ] ; Anorexia [ ] ; Fatigue Valu.Nieves ]; Fever [ ] ; Chills [ ] ; Weakness [ ]   Cardiac: Chest pain/pressure [ ] ; Resting SOB [ ] ; Exertional SOB [X] ; Orthopnea [ X]; Pedal Edema [ ] ; Palpitations [ ] ; Syncope [ ] ; Presyncope [ ] ; Paroxysmal nocturnal dyspnea[ ]   Pulmonary: Cough [ ] ; Wheezing[ ] ; Hemoptysis[ ] ; Sputum [ ] ; Snoring [ ]   GI: Vomiting[ ] ; Dysphagia[ ] ; Melena[ ] ; Hematochezia [ ] ; Heartburn[X ]; Abdominal pain [ ] ; Constipation [ ] ; Diarrhea [ ] ; BRBPR [ ]  Distention [X]  GU: Hematuria[ ] ; Dysuria [ ] ; Nocturia[ ]   Vascular: Pain in legs with walking [ ] ; Pain in feet with lying flat [ ] ; Non-healing sores [ ] ; Stroke [ ] ; TIA [ ] ; Slurred speech [ ] ;  Neuro: Headaches[ ] ; Vertigo[ ] ; Seizures[ ] ; Paresthesias[ ] ;Blurred vision [ ] ; Diplopia [ ] ; Vision changes [ ]   Ortho/Skin: Arthritis [ ] ; Joint pain [ ] ; Muscle pain [ ] ; Joint swelling [ ] ; Back Pain [ ] ; Rash [ ]   Psych: Depression[ ] ; Anxiety[ ]   Heme: Bleeding problems [ ] ; Clotting disorders [ ] ; Anemia Valu.Nieves ]  Endocrine: Diabetes Valu.Nieves ]; Thyroid dysfunction[X ]   Past Medical History:  Diagnosis Date  . Anemia   . Arthritis   . Ascites 03/28/2020  . Diabetes mellitus   . Diabetic retinopathy ([place])    Hx of laser  Rx's  . DM type 1 (diabetes mellitus, type 1) ([place]) 09/12/2014   Age of onset for DM type 1 was age 52.     . Enlarged thyroid gland   . ESRD on hemodialysis ([place])    ESRD due to DM type I, age of onset DM 1 was age 89.  Went on dialysis in March 2012.  Gets HD now at [facility] on a TTS schedule.    Marland Kitchen ESRD on hemodialysis ([place]) 09/12/2014   ESRD due to DM type 1.  Started HD in 2012 at [facility].  Gets HD there on a TTS schedule.    Marland Kitchen GERD (gastroesophageal reflux disease)   . History of blood transfusion   . Hyperlipidemia    . Hypertension   . Peripheral vascular disease ([place])   . Pneumonia     Current Outpatient Medications  Medication Sig Dispense Refill  . amLODipine (NORVASC) 10 MG tablet Take 10 mg by mouth at bedtime.    Marland Kitchen GLUCAGON EMERGENCY 1 MG injection Inject 1 mg into the muscle once as needed (for severe low blood sugar).     . Insulin Human (INSULIN PUMP) SOLN Inject into the skin continuous. Humalog   Basal rate 0.29 units/ hour    . labetalol (NORMODYNE) 300 MG tablet Take 300 mg by mouth once.    Marland Kitchen losartan (COZAAR) 50 MG tablet Take 50 mg by mouth at bedtime.     No current facility-administered medications for this encounter.    Allergies  Allergen Reactions  . Pollen Extract Other (See Comments)    Watery eyes      Social History   Socioeconomic History  . Marital status: Single    Spouse name: Not on file  . Number of children: 1  . Years of education: BACHELOR'S  . Highest education level: Not on file  Occupational History  . Not on file  Tobacco Use  . Smoking status: Never Smoker  . Smokeless tobacco: Never Used  Vaping Use  . Vaping Use: Never used  Substance and Sexual Activity  . Alcohol use: No    Alcohol/week: 0.0 standard drinks  . Drug use: No  . Sexual activity: Not Currently  Other Topics Concern  . Not on file  Social History Narrative   ** Merged History Encounter **       Patient is single with 1 child. Patient is right handed. Patient has BS degree. Patient drinks 0 caffeine.   Social Determinants of Health   Financial Resource Strain: Not on file  Food Insecurity: Not on file  Transportation Needs: Not on file  Physical Activity: Not on file  Stress: Not on file  Social Connections: Not on file  Intimate Partner Violence: Not on file      Family History  Problem Relation Age of Onset  . Cancer Mother        breast  . Hypertension Father    Wt Readings from Last 3 Encounters:  07/27/20 55.6 kg (122 lb 9.6 oz)  07/19/20 57.6 kg  (127 lb)  07/02/20 56.8 kg (125 lb 3.2 oz)    Vitals:   07/27/20 1209  BP: 130/80  Pulse: 82  SpO2: 98%  Weight: 55.6 kg (122 lb 9.6 oz)    PHYSICAL EXAM: General:  Thin, frail- appearing AAF. No respiratory difficulty. HEENT: Normal Neck: Supple. JVP jaw, v waves. Carotids 2+ bilat; no bruits. No lymphadenopathy or thryomegaly appreciated. Cor: PMI nondisplaced. Regular rate & rhythm. No rubs,  gallops, +TR. Lungs: Clear Abdomen: Distended, nontender. +hepatomegaly. No bruits or masses. Good bowel sounds. PleurX RUQ, dressing CDI. Extremities: No cyanosis, clubbing, rash, 2+ bilateral lower extremity edema. Failed LUE fistula, RUE fistula +bruit/thrill. Neuro: Alert & oriented x 3, cranial nerves grossly intact. moves all 4 extremities w/o difficulty. Affect pleasant.  ASSESSMENT & PLAN:  1. PAH with Cor Pulmonale with ascites and recurrent R pleural effusion. - Echo 7/21: EF 55-60% mild LVH. G2 DD. RV mildly dilated with normal function. Severe TR. RVSP ~ 61mmHG - TEE 7/21: EF 60-65% RV severely enlarged and moderately HK. Severe TR. No vegetation. - WHO Group I PAH in setting of ESRD possible but given time course of symptoms (following AVF extension) more likely high output failure from AVF.  - Will need RHC. - I have d/w Drs. G[place] and Dr. Scot Dock. In cath lab with occlude fistulae in stepwise fashion with BP cuff and assess hemodynamic response. (Give heparin 2500u at the time) - Will also check VQ and serologies.  2. Recurrent pleural effusion - Secondary to #1 - Much improved s/p Pleurex tube placement with Dr. Roxan Hockey.  3. DM1 - per PCP.  4. ESRD on HD - per Renal.  Total time spent 45 minutes. Over half that time spent discussing above.    Glori Bickers, MD  12:14 PM

## 2020-07-26 NOTE — Progress Notes (Signed)
Patient arrived for right PleurX catheter drainage. She was last drained Monday in the office. She is s/p PleurX catheter insertion with Dr. Roxan Hockey 07/04/20. Drained in the office today, yielded 750 ml's of dark yellow fluid. Patient tolerated well. Patient stated that she did well with drainage on Monday with no shortness of breath. Patient will come back to the office again next Monday, to be drained. If she becomes short of breath in the meantime, she is to contact the office for additional instructions. She acknowledged receipt

## 2020-07-27 ENCOUNTER — Other Ambulatory Visit (HOSPITAL_COMMUNITY): Payer: Self-pay

## 2020-07-27 ENCOUNTER — Encounter (HOSPITAL_COMMUNITY): Payer: Self-pay | Admitting: Internal Medicine

## 2020-07-27 ENCOUNTER — Ambulatory Visit (HOSPITAL_COMMUNITY)
Admit: 2020-07-27 | Discharge: 2020-07-27 | Disposition: A | Discharge status: Home / Self Care | Payer: Medicare HMO | Attending: Internal Medicine | Admitting: Internal Medicine

## 2020-07-27 VITALS — BP 130/80 | HR 82 | Wt 122.6 lb

## 2020-07-27 DIAGNOSIS — I50810 Right heart failure, unspecified: Secondary | ICD-10-CM | POA: Insufficient documentation

## 2020-07-27 DIAGNOSIS — Z794 Long term (current) use of insulin: Secondary | ICD-10-CM | POA: Insufficient documentation

## 2020-07-27 DIAGNOSIS — E785 Hyperlipidemia, unspecified: Secondary | ICD-10-CM | POA: Insufficient documentation

## 2020-07-27 DIAGNOSIS — N186 End stage renal disease: Secondary | ICD-10-CM | POA: Diagnosis not present

## 2020-07-27 DIAGNOSIS — I132 Hypertensive heart and chronic kidney disease with heart failure and with stage 5 chronic kidney disease, or end stage renal disease: Secondary | ICD-10-CM | POA: Diagnosis not present

## 2020-07-27 DIAGNOSIS — J90 Pleural effusion, not elsewhere classified: Secondary | ICD-10-CM | POA: Insufficient documentation

## 2020-07-27 DIAGNOSIS — Z79899 Other long term (current) drug therapy: Secondary | ICD-10-CM | POA: Insufficient documentation

## 2020-07-27 DIAGNOSIS — I272 Pulmonary hypertension, unspecified: Secondary | ICD-10-CM

## 2020-07-27 DIAGNOSIS — X58XXXA Exposure to other specified factors, initial encounter: Secondary | ICD-10-CM | POA: Diagnosis not present

## 2020-07-27 DIAGNOSIS — Z992 Dependence on renal dialysis: Secondary | ICD-10-CM | POA: Diagnosis not present

## 2020-07-27 DIAGNOSIS — E1051 Type 1 diabetes mellitus with diabetic peripheral angiopathy without gangrene: Secondary | ICD-10-CM | POA: Insufficient documentation

## 2020-07-27 DIAGNOSIS — E10319 Type 1 diabetes mellitus with unspecified diabetic retinopathy without macular edema: Secondary | ICD-10-CM | POA: Insufficient documentation

## 2020-07-27 DIAGNOSIS — Z9109 Other allergy status, other than to drugs and biological substances: Secondary | ICD-10-CM | POA: Insufficient documentation

## 2020-07-27 DIAGNOSIS — E1022 Type 1 diabetes mellitus with diabetic chronic kidney disease: Secondary | ICD-10-CM | POA: Insufficient documentation

## 2020-07-27 DIAGNOSIS — Z8249 Family history of ischemic heart disease and other diseases of the circulatory system: Secondary | ICD-10-CM | POA: Insufficient documentation

## 2020-07-27 DIAGNOSIS — T7849XA Other allergy, initial encounter: Secondary | ICD-10-CM | POA: Insufficient documentation

## 2020-07-27 DIAGNOSIS — R079 Chest pain, unspecified: Secondary | ICD-10-CM

## 2020-07-27 DIAGNOSIS — R188 Other ascites: Secondary | ICD-10-CM | POA: Insufficient documentation

## 2020-07-27 DIAGNOSIS — Z803 Family history of malignant neoplasm of breast: Secondary | ICD-10-CM | POA: Insufficient documentation

## 2020-07-27 LAB — BASIC METABOLIC PANEL
Anion gap: 17 — ABNORMAL HIGH (ref 5–15)
BUN: 25 mg/dL — ABNORMAL HIGH (ref 6–20)
CO2: 29 mmol/L (ref 22–32)
Calcium: 9 mg/dL (ref 8.9–10.3)
Chloride: 96 mmol/L — ABNORMAL LOW (ref 98–111)
Creatinine, Ser: 5.59 mg/dL — ABNORMAL HIGH (ref 0.44–1.00)
GFR, Estimated: 9 mL/min — ABNORMAL LOW (ref >60)
Glucose, Bld: 99 mg/dL (ref 70–99)
Potassium: 4.5 mmol/L (ref 3.5–5.1)
Sodium: 142 mmol/L (ref 135–145)

## 2020-07-27 LAB — CBC
HCT: 34.8 % — ABNORMAL LOW (ref 36.0–46.0)
Hemoglobin: 11.2 g/dL — ABNORMAL LOW (ref 12.0–15.0)
MCH: 31.4 pg (ref 26.0–34.0)
MCHC: 32.2 g/dL (ref 30.0–36.0)
MCV: 97.5 fL (ref 80.0–100.0)
Platelets: 214 10*3/uL (ref 150–400)
RBC: 3.57 MIL/uL — ABNORMAL LOW (ref 3.87–5.11)
RDW: 15.9 % — ABNORMAL HIGH (ref 11.5–15.5)
WBC: 7.1 10*3/uL (ref 4.0–10.5)
nRBC: 0 % (ref 0.0–0.2)

## 2020-07-27 NOTE — Patient Instructions (Signed)
Labs done today, your resuasting. lts will be available in MyChart, we will contact you for abnormal readings.   Chest xray and V/Q scan ordered      YOMARIS PALECEK  07/27/2020  You are scheduled for a Cardiac Catheterization on Wednesday, January 12@  0900  with Dr.  Glori Bickers  1. Please arrive at the [facility] (Main Entrance A) at [facility]: [address] [place], [place] [address] at 7:00 AM (This time is two hours before your procedure to ensure your preparation). Free valet parking service is available.   Special note: Every effort is made to have your procedure done on time. Please understand that emergencies sometimes delay scheduled procedures.  2. Diet: Do not eat solid foods after midnight.  The patient may have clear liquids until 5am upon the day of the procedure.  Covid Testing  Monday January 10,2922 @11 :40 @ [address] [place]   4. Medication instructions in preparation for your procedure: Adjust insulin pump as needed      On the morning of your procedure, take your   morning medicines NOT listed above.  You may use sips of water.  5. Plan for one night stay--bring personal belongings. 6. Bring a current list of your medications and current insurance cards. 7. You MUST have a responsible person to drive you home. 8. Someone MUST be with you the first 24 hours after you arrive home or your discharge will be delayed. 9. Please wear clothes that are easy to get on and off and wear slip-on shoes.  Thank you for allowing [place] to care for you!   -- [facility]  Your physician recommends that you schedule a follow-up appointment in: 2 months  If you have any questions or concerns before your next appointment please send [place] a message through [facility] or call our office at [phone].    TO LEAVE A MESSAGE FOR THE NURSE SELECT OPTION 2, PLEASE LEAVE A MESSAGE INCLUDING: . YOUR NAME . DATE OF  BIRTH . CALL BACK NUMBER . REASON FOR CALL**this is important as we prioritize the call backs  [place] AS LONG AS YOU CALL BEFORE 4:00 PM  At the [facility], you and your health needs are our priority. As part of our continuing mission to provide you with exceptional heart care, we have created designated Provider Care Teams. These Care Teams include your primary Cardiologist (physician) and Advanced Practice Providers (APPs- Physician Assistants and Nurse Practitioners) who all work together to provide you with the care you need, when you need it.   You may see any of the following providers on your designated Care Team at your next follow up: Marland Kitchen Dr Glori Bickers . Dr Loralie Champagne . Darrick Grinder, NP . Lyda Jester, PA . Audry Riles, PharmD   Please be sure to bring in all your medications bottles to every appointment.

## 2020-07-28 DIAGNOSIS — D631 Anemia in chronic kidney disease: Secondary | ICD-10-CM | POA: Diagnosis not present

## 2020-07-28 DIAGNOSIS — N2581 Secondary hyperparathyroidism of renal origin: Secondary | ICD-10-CM | POA: Diagnosis not present

## 2020-07-28 DIAGNOSIS — D689 Coagulation defect, unspecified: Secondary | ICD-10-CM | POA: Diagnosis not present

## 2020-07-28 DIAGNOSIS — R739 Hyperglycemia, unspecified: Secondary | ICD-10-CM | POA: Diagnosis not present

## 2020-07-28 DIAGNOSIS — N186 End stage renal disease: Secondary | ICD-10-CM | POA: Diagnosis not present

## 2020-07-28 DIAGNOSIS — Z992 Dependence on renal dialysis: Secondary | ICD-10-CM | POA: Diagnosis not present

## 2020-07-28 DIAGNOSIS — E1029 Type 1 diabetes mellitus with other diabetic kidney complication: Secondary | ICD-10-CM | POA: Diagnosis not present

## 2020-07-28 DIAGNOSIS — D509 Iron deficiency anemia, unspecified: Secondary | ICD-10-CM | POA: Diagnosis not present

## 2020-07-28 LAB — ANA W/REFLEX: Anti Nuclear Antibody (ANA): NEGATIVE

## 2020-07-28 LAB — RHEUMATOID FACTOR: Rheumatoid fact SerPl-aCnc: 48.1 IU/mL — ABNORMAL HIGH (ref <14.0)

## 2020-07-28 LAB — ANTI-SCLERODERMA ANTIBODY: Scleroderma (Scl-70) (ENA) Antibody, IgG: <0.2 AI (ref 0.0–0.9)

## 2020-07-28 LAB — CENTROMERE ANTIBODIES: Centromere Ab Screen: <0.2 AI (ref 0.0–0.9)

## 2020-07-30 ENCOUNTER — Other Ambulatory Visit (HOSPITAL_COMMUNITY)
Admission: RE | Admit: 2020-07-30 | Discharge: 2020-07-30 | Disposition: A | Discharge status: Home / Self Care | Payer: Medicare HMO | Source: Ambulatory Visit | Attending: Internal Medicine | Admitting: Internal Medicine

## 2020-07-30 ENCOUNTER — Other Ambulatory Visit: Payer: Self-pay

## 2020-07-30 ENCOUNTER — Encounter: Payer: Self-pay | Admitting: *Deleted

## 2020-07-30 ENCOUNTER — Ambulatory Visit (INDEPENDENT_AMBULATORY_CARE_PROVIDER_SITE_OTHER): Payer: Medicare HMO | Admitting: *Deleted

## 2020-07-30 VITALS — BP 128/74 | HR 81 | Resp 20

## 2020-07-30 DIAGNOSIS — J90 Pleural effusion, not elsewhere classified: Secondary | ICD-10-CM | POA: Diagnosis not present

## 2020-07-30 DIAGNOSIS — Z719 Counseling, unspecified: Secondary | ICD-10-CM

## 2020-07-30 DIAGNOSIS — Z01812 Encounter for preprocedural laboratory examination: Secondary | ICD-10-CM | POA: Diagnosis not present

## 2020-07-30 DIAGNOSIS — Z5189 Encounter for other specified aftercare: Secondary | ICD-10-CM

## 2020-07-30 DIAGNOSIS — Z20822 Contact with and (suspected) exposure to covid-19: Secondary | ICD-10-CM | POA: Insufficient documentation

## 2020-07-30 DIAGNOSIS — E1022 Type 1 diabetes mellitus with diabetic chronic kidney disease: Secondary | ICD-10-CM | POA: Diagnosis not present

## 2020-07-30 LAB — ANCA TITERS
Atypical P-ANCA titer: <1:20 {titer}
C-ANCA: <1:20 {titer}
P-ANCA: <1:20 {titer}

## 2020-07-30 LAB — CYCLIC CITRUL PEPTIDE ANTIBODY, IGG/IGA: CCP Antibodies IgG/IgA: 9 units (ref 0–19)

## 2020-07-30 LAB — SARS CORONAVIRUS 2 (TAT 6-24 HRS): SARS Coronavirus 2: NEGATIVE

## 2020-07-30 NOTE — Progress Notes (Signed)
Ms. Tango arrived for nurse visit to drain right pleurx catheter placed 07/04/20 by Dr. Roxan Hockey. Pt drained 674ml of yellow fluid from catheter. Pt tolerated well. Pt to return on Thursday for next draining.

## 2020-07-31 DIAGNOSIS — D631 Anemia in chronic kidney disease: Secondary | ICD-10-CM | POA: Diagnosis not present

## 2020-07-31 DIAGNOSIS — N186 End stage renal disease: Secondary | ICD-10-CM | POA: Diagnosis not present

## 2020-07-31 DIAGNOSIS — Z992 Dependence on renal dialysis: Secondary | ICD-10-CM | POA: Diagnosis not present

## 2020-07-31 DIAGNOSIS — N2581 Secondary hyperparathyroidism of renal origin: Secondary | ICD-10-CM | POA: Diagnosis not present

## 2020-07-31 DIAGNOSIS — E1029 Type 1 diabetes mellitus with other diabetic kidney complication: Secondary | ICD-10-CM | POA: Diagnosis not present

## 2020-07-31 DIAGNOSIS — D509 Iron deficiency anemia, unspecified: Secondary | ICD-10-CM | POA: Diagnosis not present

## 2020-07-31 DIAGNOSIS — R739 Hyperglycemia, unspecified: Secondary | ICD-10-CM | POA: Diagnosis not present

## 2020-07-31 DIAGNOSIS — D689 Coagulation defect, unspecified: Secondary | ICD-10-CM | POA: Diagnosis not present

## 2020-08-01 ENCOUNTER — Ambulatory Visit (HOSPITAL_COMMUNITY)
Admission: RE | Admit: 2020-08-01 | Discharge: 2020-08-01 | Disposition: A | Discharge status: Home / Self Care | Payer: Medicare HMO | Source: Ambulatory Visit | Attending: Internal Medicine | Admitting: Internal Medicine

## 2020-08-01 ENCOUNTER — Encounter (HOSPITAL_COMMUNITY)
Admission: RE | Disposition: A | Discharge status: Home / Self Care | Payer: Self-pay | Source: Ambulatory Visit | Attending: Internal Medicine

## 2020-08-01 DIAGNOSIS — J90 Pleural effusion, not elsewhere classified: Secondary | ICD-10-CM | POA: Insufficient documentation

## 2020-08-01 DIAGNOSIS — E1022 Type 1 diabetes mellitus with diabetic chronic kidney disease: Secondary | ICD-10-CM | POA: Insufficient documentation

## 2020-08-01 DIAGNOSIS — Z992 Dependence on renal dialysis: Secondary | ICD-10-CM | POA: Insufficient documentation

## 2020-08-01 DIAGNOSIS — I50810 Right heart failure, unspecified: Secondary | ICD-10-CM

## 2020-08-01 DIAGNOSIS — Z794 Long term (current) use of insulin: Secondary | ICD-10-CM | POA: Diagnosis not present

## 2020-08-01 DIAGNOSIS — Z79899 Other long term (current) drug therapy: Secondary | ICD-10-CM | POA: Insufficient documentation

## 2020-08-01 DIAGNOSIS — I2721 Secondary pulmonary arterial hypertension: Secondary | ICD-10-CM | POA: Diagnosis not present

## 2020-08-01 DIAGNOSIS — N186 End stage renal disease: Secondary | ICD-10-CM | POA: Diagnosis not present

## 2020-08-01 DIAGNOSIS — I272 Pulmonary hypertension, unspecified: Secondary | ICD-10-CM

## 2020-08-01 HISTORY — PX: RIGHT HEART CATH: CATH118263

## 2020-08-01 LAB — POCT I-STAT EG7
Acid-Base Excess: 3 mmol/L — ABNORMAL HIGH (ref 0.0–2.0)
Acid-Base Excess: 7 mmol/L — ABNORMAL HIGH (ref 0.0–2.0)
Acid-Base Excess: 7 mmol/L — ABNORMAL HIGH (ref 0.0–2.0)
Acid-Base Excess: 7 mmol/L — ABNORMAL HIGH (ref 0.0–2.0)
Acid-Base Excess: 8 mmol/L — ABNORMAL HIGH (ref 0.0–2.0)
Acid-Base Excess: 8 mmol/L — ABNORMAL HIGH (ref 0.0–2.0)
Bicarbonate: 28.7 mmol/L — ABNORMAL HIGH (ref 20.0–28.0)
Bicarbonate: 32.6 mmol/L — ABNORMAL HIGH (ref 20.0–28.0)
Bicarbonate: 33.1 mmol/L — ABNORMAL HIGH (ref 20.0–28.0)
Bicarbonate: 32.8 mmol/L — ABNORMAL HIGH (ref 20.0–28.0)
Bicarbonate: 34.1 mmol/L — ABNORMAL HIGH (ref 20.0–28.0)
Bicarbonate: 34.3 mmol/L — ABNORMAL HIGH (ref 20.0–28.0)
Calcium, Ion: 0.78 mmol/L — CL (ref 1.15–1.40)
Calcium, Ion: 0.95 mmol/L — ABNORMAL LOW (ref 1.15–1.40)
Calcium, Ion: 0.95 mmol/L — ABNORMAL LOW (ref 1.15–1.40)
Calcium, Ion: 0.98 mmol/L — ABNORMAL LOW (ref 1.15–1.40)
Calcium, Ion: 1.01 mmol/L — ABNORMAL LOW (ref 1.15–1.40)
Calcium, Ion: 1.02 mmol/L — ABNORMAL LOW (ref 1.15–1.40)
HCT: 28 % — ABNORMAL LOW (ref 36.0–46.0)
HCT: 31 % — ABNORMAL LOW (ref 36.0–46.0)
HCT: 32 % — ABNORMAL LOW (ref 36.0–46.0)
HCT: 31 % — ABNORMAL LOW (ref 36.0–46.0)
HCT: 32 % — ABNORMAL LOW (ref 36.0–46.0)
HCT: 32 % — ABNORMAL LOW (ref 36.0–46.0)
Hemoglobin: 10.5 g/dL — ABNORMAL LOW (ref 12.0–15.0)
Hemoglobin: 10.9 g/dL — ABNORMAL LOW (ref 12.0–15.0)
Hemoglobin: 9.5 g/dL — ABNORMAL LOW (ref 12.0–15.0)
Hemoglobin: 10.5 g/dL — ABNORMAL LOW (ref 12.0–15.0)
Hemoglobin: 10.9 g/dL — ABNORMAL LOW (ref 12.0–15.0)
Hemoglobin: 10.9 g/dL — ABNORMAL LOW (ref 12.0–15.0)
O2 Saturation: 75 %
O2 Saturation: 76 %
O2 Saturation: 77 %
O2 Saturation: 74 %
O2 Saturation: 75 %
O2 Saturation: 77 %
Potassium: 4.7 mmol/L (ref 3.5–5.1)
Potassium: 5.3 mmol/L — ABNORMAL HIGH (ref 3.5–5.1)
Potassium: 5.5 mmol/L — ABNORMAL HIGH (ref 3.5–5.1)
Potassium: 5.6 mmol/L — ABNORMAL HIGH (ref 3.5–5.1)
Potassium: 5.6 mmol/L — ABNORMAL HIGH (ref 3.5–5.1)
Potassium: 5.7 mmol/L — ABNORMAL HIGH (ref 3.5–5.1)
Sodium: 142 mmol/L (ref 135–145)
Sodium: 143 mmol/L (ref 135–145)
Sodium: 146 mmol/L — ABNORMAL HIGH (ref 135–145)
Sodium: 141 mmol/L (ref 135–145)
Sodium: 141 mmol/L (ref 135–145)
Sodium: 141 mmol/L (ref 135–145)
TCO2: 30 mmol/L (ref 22–32)
TCO2: 34 mmol/L — ABNORMAL HIGH (ref 22–32)
TCO2: 35 mmol/L — ABNORMAL HIGH (ref 22–32)
TCO2: 34 mmol/L — ABNORMAL HIGH (ref 22–32)
TCO2: 36 mmol/L — ABNORMAL HIGH (ref 22–32)
TCO2: 36 mmol/L — ABNORMAL HIGH (ref 22–32)
pCO2, Ven: 46.2 mmHg (ref 44.0–60.0)
pCO2, Ven: 51.6 mmHg (ref 44.0–60.0)
pCO2, Ven: 52.4 mmHg (ref 44.0–60.0)
pCO2, Ven: 51.8 mmHg (ref 44.0–60.0)
pCO2, Ven: 52.9 mmHg (ref 44.0–60.0)
pCO2, Ven: 53.5 mmHg (ref 44.0–60.0)
pH, Ven: 7.401 (ref 7.250–7.430)
pH, Ven: 7.408 (ref 7.250–7.430)
pH, Ven: 7.409 (ref 7.250–7.430)
pH, Ven: 7.41 (ref 7.250–7.430)
pH, Ven: 7.413 (ref 7.250–7.430)
pH, Ven: 7.419 (ref 7.250–7.430)
pO2, Ven: 40 mmHg (ref 32.0–45.0)
pO2, Ven: 41 mmHg (ref 32.0–45.0)
pO2, Ven: 42 mmHg (ref 32.0–45.0)
pO2, Ven: 40 mmHg (ref 32.0–45.0)
pO2, Ven: 40 mmHg (ref 32.0–45.0)
pO2, Ven: 42 mmHg (ref 32.0–45.0)

## 2020-08-01 LAB — POCT ACTIVATED CLOTTING TIME
Activated Clotting Time: 231 seconds
Activated Clotting Time: 190 seconds

## 2020-08-01 LAB — GLUCOSE, CAPILLARY: Glucose-Capillary: 119 mg/dL — ABNORMAL HIGH (ref 70–99)

## 2020-08-01 LAB — HCG, SERUM, QUALITATIVE: Preg, Serum: NEGATIVE

## 2020-08-01 SURGERY — RIGHT HEART CATH
Anesthesia: LOCAL

## 2020-08-01 MED ORDER — ASPIRIN 81 MG PO CHEW: 81.0000 mg | CHEWABLE_TABLET | ORAL | Status: DC

## 2020-08-01 MED ORDER — MIDAZOLAM HCL 2 MG/2ML IJ SOLN
INTRAMUSCULAR | Status: AC
  Filled 2020-08-01: qty 2

## 2020-08-01 MED ORDER — MIDAZOLAM HCL 2 MG/2ML IJ SOLN
INTRAMUSCULAR | Status: DC | PRN
  Administered 2020-08-01: 1 mg via INTRAVENOUS

## 2020-08-01 MED ORDER — HEPARIN (PORCINE) IN NACL 1000-0.9 UT/500ML-% IV SOLN
INTRAVENOUS | Status: DC | PRN
  Administered 2020-08-01 (×2): 500 mL

## 2020-08-01 MED ORDER — LIDOCAINE HCL (PF) 1 % IJ SOLN
INTRAMUSCULAR | Status: AC
  Filled 2020-08-01: qty 30

## 2020-08-01 MED ORDER — LIDOCAINE HCL (PF) 1 % IJ SOLN
INTRAMUSCULAR | Status: DC | PRN
  Administered 2020-08-01: 5 mL

## 2020-08-01 MED ORDER — HEPARIN SODIUM (PORCINE) 1000 UNIT/ML IJ SOLN
INTRAMUSCULAR | Status: DC | PRN
  Administered 2020-08-01: 2500 [IU] via INTRAVENOUS

## 2020-08-01 MED ORDER — SODIUM CHLORIDE 0.9% FLUSH: 3.0000 mL | Freq: Two times a day (BID) | INTRAVENOUS | Status: DC

## 2020-08-01 MED ORDER — FENTANYL CITRATE (PF) 100 MCG/2ML IJ SOLN
INTRAMUSCULAR | Status: AC
  Filled 2020-08-01: qty 2

## 2020-08-01 MED ORDER — HEPARIN SODIUM (PORCINE) 1000 UNIT/ML IJ SOLN
INTRAMUSCULAR | Status: AC
  Filled 2020-08-01: qty 1

## 2020-08-01 MED ORDER — SODIUM CHLORIDE 0.9 % IV SOLN: INTRAVENOUS | Status: DC

## 2020-08-01 MED ORDER — ONDANSETRON HCL 4 MG/2ML IJ SOLN: 4.0000 mg | Freq: Four times a day (QID) | INTRAMUSCULAR | Status: DC | PRN

## 2020-08-01 MED ORDER — LABETALOL HCL 5 MG/ML IV SOLN: 10.0000 mg | INTRAVENOUS | Status: DC | PRN

## 2020-08-01 MED ORDER — SODIUM CHLORIDE 0.9 % IV SOLN: 250.0000 mL | INTRAVENOUS | Status: DC | PRN

## 2020-08-01 MED ORDER — FENTANYL CITRATE (PF) 100 MCG/2ML IJ SOLN
INTRAMUSCULAR | Status: DC | PRN
  Administered 2020-08-01: 25 ug via INTRAVENOUS

## 2020-08-01 MED ORDER — HEPARIN (PORCINE) IN NACL 1000-0.9 UT/500ML-% IV SOLN
INTRAVENOUS | Status: AC
  Filled 2020-08-01: qty 1000

## 2020-08-01 MED ORDER — SODIUM CHLORIDE 0.9% FLUSH: 3.0000 mL | INTRAVENOUS | Status: DC | PRN

## 2020-08-01 MED ORDER — HYDRALAZINE HCL 20 MG/ML IJ SOLN: 10.0000 mg | INTRAMUSCULAR | Status: DC | PRN

## 2020-08-01 MED ORDER — ACETAMINOPHEN 325 MG PO TABS: 650.0000 mg | ORAL_TABLET | ORAL | Status: DC | PRN

## 2020-08-01 SURGICAL SUPPLY — 7 items
CATH SWAN GANZ 7F STRAIGHT (CATHETERS) ×2 IMPLANT
PACK CARDIAC CATHETERIZATION (CUSTOM PROCEDURE TRAY) ×2 IMPLANT
PROTECTION STATION PRESSURIZED (MISCELLANEOUS) ×2
SHEATH PINNACLE 7F 10CM (SHEATH) ×2 IMPLANT
STATION PROTECTION PRESSURIZED (MISCELLANEOUS) ×1 IMPLANT
TRANSDUCER W/STOPCOCK (MISCELLANEOUS) ×2 IMPLANT
TUBING ART PRESS 72  MALE/MALE (TUBING) ×2 IMPLANT

## 2020-08-01 NOTE — Interval H&P Note (Signed)
History and Physical Interval Note:  08/01/2020 12:37 PM  [name]  has presented today for surgery, with the diagnosis of Pulmonary Hypertension.  The various methods of treatment have been discussed with the patient and family. After consideration of risks, benefits and other options for treatment, the patient has consented to  Procedure(s): RIGHT HEART CATH (N/A) with BP compression of AV fistula as a surgical intervention.  The patient's history has been reviewed, patient examined, no change in status, stable for surgery.  I have reviewed the patient's chart and labs.  Questions were answered to the patient's satisfaction.     [name]

## 2020-08-01 NOTE — Discharge Instructions (Signed)
Femoral Site Care  This sheet gives you information about how to care for yourself after your procedure. Your health care provider may also give you more specific instructions. If you have problems or questions, contact your health care provider. What can I expect after the procedure? After the procedure, it is common to have:  Bruising that usually fades within 1-2 weeks.  Tenderness at the site. Follow these instructions at home: Wound care  Follow instructions from your health care provider about how to take care of your insertion site. Make sure you: ? Wash your hands with soap and water before you change your bandage (dressing). If soap and water are not available, use hand sanitizer. ? Change your dressing as told by your health care provider. ? Leave stitches (sutures), skin glue, or adhesive strips in place. These skin closures may need to stay in place for 2 weeks or longer. If adhesive strip edges start to loosen and curl up, you may trim the loose edges. Do not remove adhesive strips completely unless your health care provider tells you to do that.  Do not take baths, swim, or use a hot tub until your health care provider approves.  You may shower 24-48 hours after the procedure or as told by your health care provider. ? Gently wash the site with plain soap and water. ? Pat the area dry with a clean towel. ? Do not rub the site. This may cause bleeding.  Do not apply powder or lotion to the site. Keep the site clean and dry.  Check your femoral site every day for signs of infection. Check for: ? Redness, swelling, or pain. ? Fluid or blood. ? Warmth. ? Pus or a bad smell. Activity  For the first 2-3 days after your procedure, or as long as directed: ? Avoid climbing stairs as much as possible. ? Do not squat.  Do not lift anything that is heavier than 10 lb (4.5 kg), or the limit that you are told, until your health care provider says that it is safe.  Rest as  directed. ? Avoid sitting for a long time without moving. Get up to take short walks every 1-2 hours.  Do not drive for 24 hours if you were given a medicine to help you relax (sedative). General instructions  Take over-the-counter and prescription medicines only as told by your health care provider.  Keep all follow-up visits as told by your health care provider. This is important. Contact a health care provider if you have:  A fever or chills.  You have redness, swelling, or pain around your insertion site. Get help right away if:  The catheter insertion area swells very fast.  You pass out.  You suddenly start to sweat or your skin gets clammy.  The catheter insertion area is bleeding, and the bleeding does not stop when you hold steady pressure on the area.  The area near or just beyond the catheter insertion site becomes pale, cool, tingly, or numb. These symptoms may represent a serious problem that is an emergency. Do not wait to see if the symptoms will go away. Get medical help right away. Call your local emergency services (911 in the [place] Do not drive yourself to the hospital. Summary  After the procedure, it is common to have bruising that usually fades within 1-2 weeks.  Check your femoral site every day for signs of infection.  Do not lift anything that is heavier than 10 lb (4.5 kg), or   the limit that you are told, until your health care provider says that it is safe. This information is not intended to replace advice given to you by your health care provider. Make sure you discuss any questions you have with your health care provider. Document Revised: 03/09/2020 Document Reviewed: 03/09/2020 Elsevier Patient Education  2021 [employer] Inc[employer]  

## 2020-08-01 NOTE — Addendum Note (Signed)
Encounter addended by: Scarlette Calico, RN on: 08/01/2020 2:29 PM  Actions taken: Diagnosis association updated, Order list changed

## 2020-08-01 NOTE — Progress Notes (Signed)
Site area: Right groin a 7 french venous sheath was removed  Site Prior to Removal:  Level 0  Pressure Applied For15 MINUTES    Bedrest Beginning at 1440p for 1 hour  Manual:   Yes.    Patient Status During Pull:  stable  Post Pull Groin Site:  Level 0  Post Pull Instructions Given:  Yes.    Post Pull Pulses Present:  Yes.    Dressing Applied:  Yes.    Comments:

## 2020-08-01 NOTE — Progress Notes (Signed)
Discharge instructions reviewed with pt and her friend (via telephone) both voice understanding.

## 2020-08-02 ENCOUNTER — Ambulatory Visit (INDEPENDENT_AMBULATORY_CARE_PROVIDER_SITE_OTHER): Payer: Medicare HMO

## 2020-08-02 ENCOUNTER — Other Ambulatory Visit: Payer: Self-pay

## 2020-08-02 ENCOUNTER — Encounter (HOSPITAL_COMMUNITY): Payer: Self-pay | Admitting: Internal Medicine

## 2020-08-02 DIAGNOSIS — D509 Iron deficiency anemia, unspecified: Secondary | ICD-10-CM | POA: Diagnosis not present

## 2020-08-02 DIAGNOSIS — D689 Coagulation defect, unspecified: Secondary | ICD-10-CM | POA: Diagnosis not present

## 2020-08-02 DIAGNOSIS — N186 End stage renal disease: Secondary | ICD-10-CM | POA: Diagnosis not present

## 2020-08-02 DIAGNOSIS — J90 Pleural effusion, not elsewhere classified: Secondary | ICD-10-CM

## 2020-08-02 DIAGNOSIS — Z5189 Encounter for other specified aftercare: Secondary | ICD-10-CM

## 2020-08-02 DIAGNOSIS — Z719 Counseling, unspecified: Secondary | ICD-10-CM

## 2020-08-02 DIAGNOSIS — E1029 Type 1 diabetes mellitus with other diabetic kidney complication: Secondary | ICD-10-CM | POA: Diagnosis not present

## 2020-08-02 DIAGNOSIS — D631 Anemia in chronic kidney disease: Secondary | ICD-10-CM | POA: Diagnosis not present

## 2020-08-02 DIAGNOSIS — N2581 Secondary hyperparathyroidism of renal origin: Secondary | ICD-10-CM | POA: Diagnosis not present

## 2020-08-02 DIAGNOSIS — R739 Hyperglycemia, unspecified: Secondary | ICD-10-CM | POA: Diagnosis not present

## 2020-08-02 DIAGNOSIS — Z992 Dependence on renal dialysis: Secondary | ICD-10-CM | POA: Diagnosis not present

## 2020-08-02 NOTE — Progress Notes (Signed)
Drained right pleurX today, the drainage amount was 750 cc's of light gold pleural fluid. Patient tolerated well. She will return next Monday or Tuesday to drain again.

## 2020-08-04 DIAGNOSIS — Z992 Dependence on renal dialysis: Secondary | ICD-10-CM | POA: Diagnosis not present

## 2020-08-04 DIAGNOSIS — E1029 Type 1 diabetes mellitus with other diabetic kidney complication: Secondary | ICD-10-CM | POA: Diagnosis not present

## 2020-08-04 DIAGNOSIS — N186 End stage renal disease: Secondary | ICD-10-CM | POA: Diagnosis not present

## 2020-08-04 DIAGNOSIS — N2581 Secondary hyperparathyroidism of renal origin: Secondary | ICD-10-CM | POA: Diagnosis not present

## 2020-08-04 DIAGNOSIS — D509 Iron deficiency anemia, unspecified: Secondary | ICD-10-CM | POA: Diagnosis not present

## 2020-08-04 DIAGNOSIS — D631 Anemia in chronic kidney disease: Secondary | ICD-10-CM | POA: Diagnosis not present

## 2020-08-04 DIAGNOSIS — R739 Hyperglycemia, unspecified: Secondary | ICD-10-CM | POA: Diagnosis not present

## 2020-08-04 DIAGNOSIS — D689 Coagulation defect, unspecified: Secondary | ICD-10-CM | POA: Diagnosis not present

## 2020-08-07 ENCOUNTER — Encounter: Payer: Medicare HMO | Admitting: Thoracic Surgery (Cardiothoracic Vascular Surgery)

## 2020-08-09 ENCOUNTER — Ambulatory Visit: Payer: Medicare HMO

## 2020-08-09 ENCOUNTER — Other Ambulatory Visit: Payer: Self-pay

## 2020-08-09 DIAGNOSIS — D631 Anemia in chronic kidney disease: Secondary | ICD-10-CM | POA: Diagnosis not present

## 2020-08-09 DIAGNOSIS — D689 Coagulation defect, unspecified: Secondary | ICD-10-CM | POA: Diagnosis not present

## 2020-08-09 DIAGNOSIS — Z992 Dependence on renal dialysis: Secondary | ICD-10-CM | POA: Diagnosis not present

## 2020-08-09 DIAGNOSIS — N2581 Secondary hyperparathyroidism of renal origin: Secondary | ICD-10-CM | POA: Diagnosis not present

## 2020-08-09 DIAGNOSIS — R739 Hyperglycemia, unspecified: Secondary | ICD-10-CM | POA: Diagnosis not present

## 2020-08-09 DIAGNOSIS — Z5189 Encounter for other specified aftercare: Secondary | ICD-10-CM

## 2020-08-09 DIAGNOSIS — D509 Iron deficiency anemia, unspecified: Secondary | ICD-10-CM | POA: Diagnosis not present

## 2020-08-09 DIAGNOSIS — E1029 Type 1 diabetes mellitus with other diabetic kidney complication: Secondary | ICD-10-CM | POA: Diagnosis not present

## 2020-08-09 DIAGNOSIS — N186 End stage renal disease: Secondary | ICD-10-CM | POA: Diagnosis not present

## 2020-08-09 NOTE — Progress Notes (Signed)
Patient arrived for right PleurX catheter drainage. She was last drainedMondayin the office. She is s/p PleurX catheter insertion with Dr. Roxan Hockey 07/04/20. Drained in the office today, yielded 750 ml's of light yellow pleural fluid. Patient tolerated well. Patient stated that she did wellwithdrainage onMondaywith no shortness of breath.Patient will come back to the office againnext Monday, to be drained.If she becomes short of breath in the meantime, she is to contact the office for additionalinstructions. She acknowledged receipt.  She was unable to make it to her appointment with Dr. Roxan Hockey due to weather so that was rescheduled.  She is aware of both return appointments for drainage and to see surgeon.

## 2020-08-10 DIAGNOSIS — I509 Heart failure, unspecified: Secondary | ICD-10-CM | POA: Diagnosis not present

## 2020-08-10 DIAGNOSIS — I10 Essential (primary) hypertension: Secondary | ICD-10-CM | POA: Diagnosis not present

## 2020-08-10 DIAGNOSIS — E119 Type 2 diabetes mellitus without complications: Secondary | ICD-10-CM | POA: Diagnosis not present

## 2020-08-10 DIAGNOSIS — J918 Pleural effusion in other conditions classified elsewhere: Secondary | ICD-10-CM | POA: Diagnosis not present

## 2020-08-11 DIAGNOSIS — D631 Anemia in chronic kidney disease: Secondary | ICD-10-CM | POA: Diagnosis not present

## 2020-08-11 DIAGNOSIS — N186 End stage renal disease: Secondary | ICD-10-CM | POA: Diagnosis not present

## 2020-08-11 DIAGNOSIS — D509 Iron deficiency anemia, unspecified: Secondary | ICD-10-CM | POA: Diagnosis not present

## 2020-08-11 DIAGNOSIS — R739 Hyperglycemia, unspecified: Secondary | ICD-10-CM | POA: Diagnosis not present

## 2020-08-11 DIAGNOSIS — N2581 Secondary hyperparathyroidism of renal origin: Secondary | ICD-10-CM | POA: Diagnosis not present

## 2020-08-11 DIAGNOSIS — D689 Coagulation defect, unspecified: Secondary | ICD-10-CM | POA: Diagnosis not present

## 2020-08-11 DIAGNOSIS — E1029 Type 1 diabetes mellitus with other diabetic kidney complication: Secondary | ICD-10-CM | POA: Diagnosis not present

## 2020-08-11 DIAGNOSIS — Z992 Dependence on renal dialysis: Secondary | ICD-10-CM | POA: Diagnosis not present

## 2020-08-13 ENCOUNTER — Other Ambulatory Visit: Payer: Self-pay

## 2020-08-13 ENCOUNTER — Ambulatory Visit (INDEPENDENT_AMBULATORY_CARE_PROVIDER_SITE_OTHER): Payer: Medicare HMO | Admitting: *Deleted

## 2020-08-13 DIAGNOSIS — J90 Pleural effusion, not elsewhere classified: Secondary | ICD-10-CM | POA: Diagnosis not present

## 2020-08-13 DIAGNOSIS — Z719 Counseling, unspecified: Secondary | ICD-10-CM

## 2020-08-13 DIAGNOSIS — Z5189 Encounter for other specified aftercare: Secondary | ICD-10-CM

## 2020-08-13 NOTE — Progress Notes (Signed)
Pt arrived for drainage of pleurx catheter. 717m of light yellow fluid drained from catheter without difficulty. Pt to return Thursday for next draining.

## 2020-08-14 DIAGNOSIS — N2581 Secondary hyperparathyroidism of renal origin: Secondary | ICD-10-CM | POA: Diagnosis not present

## 2020-08-14 DIAGNOSIS — D509 Iron deficiency anemia, unspecified: Secondary | ICD-10-CM | POA: Diagnosis not present

## 2020-08-14 DIAGNOSIS — D689 Coagulation defect, unspecified: Secondary | ICD-10-CM | POA: Diagnosis not present

## 2020-08-14 DIAGNOSIS — Z992 Dependence on renal dialysis: Secondary | ICD-10-CM | POA: Diagnosis not present

## 2020-08-14 DIAGNOSIS — E1029 Type 1 diabetes mellitus with other diabetic kidney complication: Secondary | ICD-10-CM | POA: Diagnosis not present

## 2020-08-14 DIAGNOSIS — D631 Anemia in chronic kidney disease: Secondary | ICD-10-CM | POA: Diagnosis not present

## 2020-08-14 DIAGNOSIS — N186 End stage renal disease: Secondary | ICD-10-CM | POA: Diagnosis not present

## 2020-08-14 DIAGNOSIS — R739 Hyperglycemia, unspecified: Secondary | ICD-10-CM | POA: Diagnosis not present

## 2020-08-16 ENCOUNTER — Ambulatory Visit (INDEPENDENT_AMBULATORY_CARE_PROVIDER_SITE_OTHER): Payer: Medicare HMO

## 2020-08-16 ENCOUNTER — Other Ambulatory Visit: Payer: Self-pay

## 2020-08-16 DIAGNOSIS — Z5189 Encounter for other specified aftercare: Secondary | ICD-10-CM

## 2020-08-16 DIAGNOSIS — Z719 Counseling, unspecified: Secondary | ICD-10-CM | POA: Diagnosis not present

## 2020-08-16 DIAGNOSIS — J90 Pleural effusion, not elsewhere classified: Secondary | ICD-10-CM | POA: Diagnosis not present

## 2020-08-16 DIAGNOSIS — D689 Coagulation defect, unspecified: Secondary | ICD-10-CM | POA: Diagnosis not present

## 2020-08-16 DIAGNOSIS — N186 End stage renal disease: Secondary | ICD-10-CM | POA: Diagnosis not present

## 2020-08-16 DIAGNOSIS — D509 Iron deficiency anemia, unspecified: Secondary | ICD-10-CM | POA: Diagnosis not present

## 2020-08-16 DIAGNOSIS — N2581 Secondary hyperparathyroidism of renal origin: Secondary | ICD-10-CM | POA: Diagnosis not present

## 2020-08-16 DIAGNOSIS — E1029 Type 1 diabetes mellitus with other diabetic kidney complication: Secondary | ICD-10-CM | POA: Diagnosis not present

## 2020-08-16 DIAGNOSIS — Z992 Dependence on renal dialysis: Secondary | ICD-10-CM | POA: Diagnosis not present

## 2020-08-16 DIAGNOSIS — R739 Hyperglycemia, unspecified: Secondary | ICD-10-CM | POA: Diagnosis not present

## 2020-08-16 DIAGNOSIS — D631 Anemia in chronic kidney disease: Secondary | ICD-10-CM | POA: Diagnosis not present

## 2020-08-16 NOTE — Progress Notes (Signed)
Patient arrived forrightPleurX catheter drainage. She was last drainedMondayin the office. She is s/p PleurX catheter insertion with Dr. Roxan Hockey 07/04/20. Drained in the office today,yielded 700 ml's of amber pleural fluid with a lot of froth. Patient tolerated well.Patient stated that she did wellwithdrainage onMondaywith no shortness of breath.Patient will come back to the office againnextMonday, to be drained.If she becomes short of breath in the meantime, she is to contact the office for additionalinstructions.

## 2020-08-18 DIAGNOSIS — N186 End stage renal disease: Secondary | ICD-10-CM | POA: Diagnosis not present

## 2020-08-18 DIAGNOSIS — N2581 Secondary hyperparathyroidism of renal origin: Secondary | ICD-10-CM | POA: Diagnosis not present

## 2020-08-18 DIAGNOSIS — Z992 Dependence on renal dialysis: Secondary | ICD-10-CM | POA: Diagnosis not present

## 2020-08-18 DIAGNOSIS — R739 Hyperglycemia, unspecified: Secondary | ICD-10-CM | POA: Diagnosis not present

## 2020-08-18 DIAGNOSIS — D689 Coagulation defect, unspecified: Secondary | ICD-10-CM | POA: Diagnosis not present

## 2020-08-18 DIAGNOSIS — D631 Anemia in chronic kidney disease: Secondary | ICD-10-CM | POA: Diagnosis not present

## 2020-08-18 DIAGNOSIS — D509 Iron deficiency anemia, unspecified: Secondary | ICD-10-CM | POA: Diagnosis not present

## 2020-08-18 DIAGNOSIS — E1029 Type 1 diabetes mellitus with other diabetic kidney complication: Secondary | ICD-10-CM | POA: Diagnosis not present

## 2020-08-20 ENCOUNTER — Ambulatory Visit (HOSPITAL_COMMUNITY)
Admission: RE | Admit: 2020-08-20 | Discharge: 2020-08-20 | Disposition: A | Discharge status: Home / Self Care | Payer: Medicare HMO | Source: Ambulatory Visit | Attending: Internal Medicine | Admitting: Internal Medicine

## 2020-08-20 ENCOUNTER — Other Ambulatory Visit: Payer: Self-pay

## 2020-08-20 ENCOUNTER — Other Ambulatory Visit: Payer: Self-pay | Admitting: Thoracic Surgery (Cardiothoracic Vascular Surgery)

## 2020-08-20 ENCOUNTER — Other Ambulatory Visit (HOSPITAL_COMMUNITY): Payer: Self-pay | Admitting: Internal Medicine

## 2020-08-20 ENCOUNTER — Ambulatory Visit (INDEPENDENT_AMBULATORY_CARE_PROVIDER_SITE_OTHER): Payer: Medicare HMO | Admitting: *Deleted

## 2020-08-20 DIAGNOSIS — I10 Essential (primary) hypertension: Secondary | ICD-10-CM | POA: Diagnosis not present

## 2020-08-20 DIAGNOSIS — I272 Pulmonary hypertension, unspecified: Secondary | ICD-10-CM

## 2020-08-20 DIAGNOSIS — J90 Pleural effusion, not elsewhere classified: Secondary | ICD-10-CM

## 2020-08-20 DIAGNOSIS — Z5189 Encounter for other specified aftercare: Secondary | ICD-10-CM

## 2020-08-20 DIAGNOSIS — E1029 Type 1 diabetes mellitus with other diabetic kidney complication: Secondary | ICD-10-CM | POA: Diagnosis not present

## 2020-08-20 DIAGNOSIS — N186 End stage renal disease: Secondary | ICD-10-CM | POA: Diagnosis not present

## 2020-08-20 DIAGNOSIS — Z719 Counseling, unspecified: Secondary | ICD-10-CM

## 2020-08-20 DIAGNOSIS — Z992 Dependence on renal dialysis: Secondary | ICD-10-CM | POA: Diagnosis not present

## 2020-08-20 MED ORDER — TECHNETIUM TO 99M ALBUMIN AGGREGATED
4.2500 | Freq: Once | INTRAVENOUS | Status: AC | PRN
  Administered 2020-08-20: 4.25 via INTRAVENOUS

## 2020-08-20 NOTE — Progress Notes (Signed)
[name] arrived for a nurse visit to drain right pleurx catheter. 642m drained during today's visit. No c/o SOB or pain. Pt will return tomorrow for f/u with [name]

## 2020-08-21 ENCOUNTER — Encounter: Payer: Self-pay | Admitting: Thoracic Surgery (Cardiothoracic Vascular Surgery)

## 2020-08-21 ENCOUNTER — Ambulatory Visit
Admission: RE | Admit: 2020-08-21 | Discharge: 2020-08-21 | Disposition: A | Discharge status: Home / Self Care | Payer: Medicare HMO | Source: Ambulatory Visit | Attending: Thoracic Surgery (Cardiothoracic Vascular Surgery) | Admitting: Thoracic Surgery (Cardiothoracic Vascular Surgery)

## 2020-08-21 ENCOUNTER — Ambulatory Visit (INDEPENDENT_AMBULATORY_CARE_PROVIDER_SITE_OTHER): Payer: Medicare HMO | Admitting: Thoracic Surgery (Cardiothoracic Vascular Surgery)

## 2020-08-21 VITALS — BP 164/81 | HR 96 | Resp 20 | Ht 63.0 in | Wt 121.2 lb

## 2020-08-21 DIAGNOSIS — Z8709 Personal history of other diseases of the respiratory system: Secondary | ICD-10-CM | POA: Diagnosis not present

## 2020-08-21 DIAGNOSIS — N186 End stage renal disease: Secondary | ICD-10-CM | POA: Diagnosis not present

## 2020-08-21 DIAGNOSIS — I517 Cardiomegaly: Secondary | ICD-10-CM | POA: Diagnosis not present

## 2020-08-21 DIAGNOSIS — D509 Iron deficiency anemia, unspecified: Secondary | ICD-10-CM | POA: Diagnosis not present

## 2020-08-21 DIAGNOSIS — J90 Pleural effusion, not elsewhere classified: Secondary | ICD-10-CM

## 2020-08-21 DIAGNOSIS — Z992 Dependence on renal dialysis: Secondary | ICD-10-CM | POA: Diagnosis not present

## 2020-08-21 DIAGNOSIS — D631 Anemia in chronic kidney disease: Secondary | ICD-10-CM | POA: Diagnosis not present

## 2020-08-21 DIAGNOSIS — D689 Coagulation defect, unspecified: Secondary | ICD-10-CM | POA: Diagnosis not present

## 2020-08-21 DIAGNOSIS — E1029 Type 1 diabetes mellitus with other diabetic kidney complication: Secondary | ICD-10-CM | POA: Diagnosis not present

## 2020-08-21 DIAGNOSIS — N2581 Secondary hyperparathyroidism of renal origin: Secondary | ICD-10-CM | POA: Diagnosis not present

## 2020-08-21 NOTE — Progress Notes (Signed)
[address]       [place] [address]             [phone]    HPI: [name] returns for a scheduled follow-up visit regarding her recurrent right pleural effusion  [name] is a 45 year old woman with end-stage renal disease on hemodialysis whose past history also includes type 1 diabetes, diabetic retinopathy, diabetic neuropathy, right heart failure, severe tricuspid regurgitation, hypertension, hyperlipidemia, ascites and recurrent right pleural effusion.  I placed a pleural catheter on 07/04/2020 for palliative management of the pleural effusion until her underlying medical issues be treated.  We were unable to arrange for home health nurse so she is coming to the office on Mondays and Thursdays to be drained.  She drained 650 mL yesterday.  She has had symptomatic relief since placement of the catheter.  She still has significant ascites.  Past Medical History:  Diagnosis Date  . Anemia   . Arthritis   . Ascites 03/28/2020  . Diabetes mellitus   . Diabetic retinopathy ([place])    Hx of laser Rx's  . DM type 1 (diabetes mellitus, type 1) ([place]) 09/12/2014   Age of onset for DM type 1 was age 24.     . Enlarged thyroid gland   . ESRD on hemodialysis ([place])    ESRD due to DM type I, age of onset DM 1 was age 34.  Went on dialysis in March 2012.  Gets HD now at [facility] on a TTS schedule.    Marland Kitchen ESRD on hemodialysis ([place]) 09/12/2014   ESRD due to DM type 1.  Started HD in 2012 at [facility].  Gets HD there on a TTS schedule.    Marland Kitchen GERD (gastroesophageal reflux disease)   . History of blood transfusion   . Hyperlipidemia   . Hypertension   . Peripheral vascular disease ([place])   . Pneumonia     Current Outpatient Medications  Medication Sig Dispense Refill  . amLODipine (NORVASC) 10 MG tablet Take 10 mg by mouth at bedtime.    Marland Kitchen GLUCAGON EMERGENCY 1 MG injection Inject 1 mg into the muscle once as needed (for severe low blood sugar).     . Insulin  Human (INSULIN PUMP) SOLN Inject into the skin continuous. Humalog   Basal rate 0.29 units/ hour    . labetalol (NORMODYNE) 300 MG tablet Take 300 mg by mouth once.    Marland Kitchen losartan (COZAAR) 50 MG tablet Take 50 mg by mouth at bedtime.     No current facility-administered medications for this visit.    Physical Exam BP (!) 164/81 (BP Location: Left Arm, Patient Position: Sitting, Cuff Size: Normal)   Pulse 96   Resp 20   Ht '5\' 3"'$  (1.6 m)   Wt 121 lb 3.2 oz (55 kg)   SpO2 94% Comment: 2L [place] PRN  BMI 21.23 kg/m  45 year old woman in no acute distress Alert and oriented x3 with no focal deficits Catheter site dressed Slightly diminished breath sounds at right base Abdomen nondistended  Diagnostic Tests: CHEST - 2 VIEW  COMPARISON:  08/20/2020  FINDINGS: No significant interval change in chest radiographs with right-sided tunneled pleural drainage catheter about the posterior right lower lobe and a small right pleural effusion. No new airspace opacity. The left lung is normally aerated. Cardiomegaly.  IMPRESSION: 1. No significant interval change in chest radiographs with right-sided tunneled pleural drainage catheter about the posterior right lower lobe and  a small right pleural effusion.  2.  No new airspace opacity.  3.  Cardiomegaly.   Electronically Signed   By: Eddie Candle M.D.   On: 08/21/2020 15:51 I personally reviewed the chest x-ray images.  There has been significant improvement in the right pleural effusion compared to preop.  There are still some small areas of undrained effusion.  Impression: [name] is a 45 year old woman with type 1 diabetes complicated by retinopathy and neuropathy, end-stage renal disease on hemodialysis, ascites, and a recurrent right pleural effusion.  She has right heart failure with cor pulmonale.  I placed a pleural catheter for control of a rapidly recurring right pleural effusion until her underlying medical issues  can be addressed and optimized.  She has had symptomatic relief with a pleural catheter.  She is draining relatively large volumes but we are not able to increase the frequency of her drainages due to social issues.  Plan: Continue with twice weekly drainage at the office for now. Return to the office with a chest x-ray for follow-up appointment with a PA and lateral chest x-ray in 1 month.  Melrose Nakayama, MD [facility] [phone]

## 2020-08-23 ENCOUNTER — Other Ambulatory Visit: Payer: Self-pay

## 2020-08-23 ENCOUNTER — Ambulatory Visit (INDEPENDENT_AMBULATORY_CARE_PROVIDER_SITE_OTHER): Payer: Medicare HMO | Admitting: *Deleted

## 2020-08-23 DIAGNOSIS — Z719 Counseling, unspecified: Secondary | ICD-10-CM

## 2020-08-23 DIAGNOSIS — D689 Coagulation defect, unspecified: Secondary | ICD-10-CM | POA: Diagnosis not present

## 2020-08-23 DIAGNOSIS — J90 Pleural effusion, not elsewhere classified: Secondary | ICD-10-CM

## 2020-08-23 DIAGNOSIS — Z992 Dependence on renal dialysis: Secondary | ICD-10-CM | POA: Diagnosis not present

## 2020-08-23 DIAGNOSIS — N186 End stage renal disease: Secondary | ICD-10-CM | POA: Diagnosis not present

## 2020-08-23 DIAGNOSIS — N2581 Secondary hyperparathyroidism of renal origin: Secondary | ICD-10-CM | POA: Diagnosis not present

## 2020-08-23 DIAGNOSIS — E1029 Type 1 diabetes mellitus with other diabetic kidney complication: Secondary | ICD-10-CM | POA: Diagnosis not present

## 2020-08-23 DIAGNOSIS — Z5189 Encounter for other specified aftercare: Secondary | ICD-10-CM

## 2020-08-23 DIAGNOSIS — D631 Anemia in chronic kidney disease: Secondary | ICD-10-CM | POA: Diagnosis not present

## 2020-08-23 DIAGNOSIS — D509 Iron deficiency anemia, unspecified: Secondary | ICD-10-CM | POA: Diagnosis not present

## 2020-08-23 NOTE — Progress Notes (Signed)
Pt presented for nurse visit to drain right pleural drainage catheter. 59m of light yellow fluid drained from catheter. No c/o pain, discomfort or SOB during visit. Pt will return Monday for next draining.

## 2020-08-25 DIAGNOSIS — D689 Coagulation defect, unspecified: Secondary | ICD-10-CM | POA: Diagnosis not present

## 2020-08-25 DIAGNOSIS — D631 Anemia in chronic kidney disease: Secondary | ICD-10-CM | POA: Diagnosis not present

## 2020-08-25 DIAGNOSIS — Z992 Dependence on renal dialysis: Secondary | ICD-10-CM | POA: Diagnosis not present

## 2020-08-25 DIAGNOSIS — N2581 Secondary hyperparathyroidism of renal origin: Secondary | ICD-10-CM | POA: Diagnosis not present

## 2020-08-25 DIAGNOSIS — N186 End stage renal disease: Secondary | ICD-10-CM | POA: Diagnosis not present

## 2020-08-25 DIAGNOSIS — D509 Iron deficiency anemia, unspecified: Secondary | ICD-10-CM | POA: Diagnosis not present

## 2020-08-25 DIAGNOSIS — E1029 Type 1 diabetes mellitus with other diabetic kidney complication: Secondary | ICD-10-CM | POA: Diagnosis not present

## 2020-08-27 ENCOUNTER — Ambulatory Visit (INDEPENDENT_AMBULATORY_CARE_PROVIDER_SITE_OTHER): Payer: Medicare HMO

## 2020-08-27 ENCOUNTER — Other Ambulatory Visit: Payer: Self-pay

## 2020-08-27 DIAGNOSIS — I119 Hypertensive heart disease without heart failure: Secondary | ICD-10-CM | POA: Diagnosis not present

## 2020-08-27 DIAGNOSIS — D638 Anemia in other chronic diseases classified elsewhere: Secondary | ICD-10-CM | POA: Diagnosis not present

## 2020-08-27 DIAGNOSIS — Z719 Counseling, unspecified: Secondary | ICD-10-CM | POA: Diagnosis not present

## 2020-08-27 DIAGNOSIS — E1165 Type 2 diabetes mellitus with hyperglycemia: Secondary | ICD-10-CM | POA: Diagnosis not present

## 2020-08-27 DIAGNOSIS — J90 Pleural effusion, not elsewhere classified: Secondary | ICD-10-CM | POA: Diagnosis not present

## 2020-08-27 DIAGNOSIS — E782 Mixed hyperlipidemia: Secondary | ICD-10-CM | POA: Diagnosis not present

## 2020-08-27 DIAGNOSIS — Z5189 Encounter for other specified aftercare: Secondary | ICD-10-CM

## 2020-08-27 NOTE — Progress Notes (Signed)
Patient arrived forrightPleurX catheter drainage. She was last drained Thursdayin the office. She is s/p PleurX catheter insertion with Dr. Roxan Hockey 07/04/20. Drained in the office today,yielded 700 ml's ofamber pleuralfluid with a lot of froth. Patient tolerated well.New dressing in place.  Patient will come back to the office againThursday, to be drained.If she becomes short of breath in the meantime, she is to contact the office for additionalinstructions.  She stated that she does not feel well today.  Right facial swelling observed, patient is to see her PCP today at 11:45.  Also advised to see her Cardiologist as well, she acknowledged receipt.

## 2020-08-28 DIAGNOSIS — E1029 Type 1 diabetes mellitus with other diabetic kidney complication: Secondary | ICD-10-CM | POA: Diagnosis not present

## 2020-08-28 DIAGNOSIS — Z992 Dependence on renal dialysis: Secondary | ICD-10-CM | POA: Diagnosis not present

## 2020-08-28 DIAGNOSIS — D631 Anemia in chronic kidney disease: Secondary | ICD-10-CM | POA: Diagnosis not present

## 2020-08-28 DIAGNOSIS — N2581 Secondary hyperparathyroidism of renal origin: Secondary | ICD-10-CM | POA: Diagnosis not present

## 2020-08-28 DIAGNOSIS — N186 End stage renal disease: Secondary | ICD-10-CM | POA: Diagnosis not present

## 2020-08-28 DIAGNOSIS — D689 Coagulation defect, unspecified: Secondary | ICD-10-CM | POA: Diagnosis not present

## 2020-08-28 DIAGNOSIS — D509 Iron deficiency anemia, unspecified: Secondary | ICD-10-CM | POA: Diagnosis not present

## 2020-08-30 ENCOUNTER — Ambulatory Visit (INDEPENDENT_AMBULATORY_CARE_PROVIDER_SITE_OTHER): Payer: Medicare HMO

## 2020-08-30 ENCOUNTER — Other Ambulatory Visit: Payer: Self-pay

## 2020-08-30 DIAGNOSIS — J90 Pleural effusion, not elsewhere classified: Secondary | ICD-10-CM

## 2020-08-30 DIAGNOSIS — E1022 Type 1 diabetes mellitus with diabetic chronic kidney disease: Secondary | ICD-10-CM | POA: Diagnosis not present

## 2020-08-30 DIAGNOSIS — N2581 Secondary hyperparathyroidism of renal origin: Secondary | ICD-10-CM | POA: Diagnosis not present

## 2020-08-30 DIAGNOSIS — D689 Coagulation defect, unspecified: Secondary | ICD-10-CM | POA: Diagnosis not present

## 2020-08-30 DIAGNOSIS — D509 Iron deficiency anemia, unspecified: Secondary | ICD-10-CM | POA: Diagnosis not present

## 2020-08-30 DIAGNOSIS — Z5189 Encounter for other specified aftercare: Secondary | ICD-10-CM

## 2020-08-30 DIAGNOSIS — Z719 Counseling, unspecified: Secondary | ICD-10-CM | POA: Diagnosis not present

## 2020-08-30 DIAGNOSIS — N186 End stage renal disease: Secondary | ICD-10-CM | POA: Diagnosis not present

## 2020-08-30 DIAGNOSIS — Z992 Dependence on renal dialysis: Secondary | ICD-10-CM | POA: Diagnosis not present

## 2020-08-30 DIAGNOSIS — E1029 Type 1 diabetes mellitus with other diabetic kidney complication: Secondary | ICD-10-CM | POA: Diagnosis not present

## 2020-08-30 DIAGNOSIS — D631 Anemia in chronic kidney disease: Secondary | ICD-10-CM | POA: Diagnosis not present

## 2020-08-30 NOTE — Progress Notes (Signed)
Patient arrived forrightPleurX catheter drainage. She was last drained Mondayin the office. She is s/p PleurX catheter insertion with Dr. Roxan Hockey 07/04/20. Drained in the office today,yielded 420m's ofamberpleuralfluidwith some froth. Patient tolerated well.New dressing in place.  Patient will come back to the office againMonday, to be drained.If she becomes short of breath in the meantime, she is to contact the office for additionalinstructions.

## 2020-09-01 DIAGNOSIS — N2581 Secondary hyperparathyroidism of renal origin: Secondary | ICD-10-CM | POA: Diagnosis not present

## 2020-09-01 DIAGNOSIS — D509 Iron deficiency anemia, unspecified: Secondary | ICD-10-CM | POA: Diagnosis not present

## 2020-09-01 DIAGNOSIS — D689 Coagulation defect, unspecified: Secondary | ICD-10-CM | POA: Diagnosis not present

## 2020-09-01 DIAGNOSIS — Z992 Dependence on renal dialysis: Secondary | ICD-10-CM | POA: Diagnosis not present

## 2020-09-01 DIAGNOSIS — D631 Anemia in chronic kidney disease: Secondary | ICD-10-CM | POA: Diagnosis not present

## 2020-09-01 DIAGNOSIS — N186 End stage renal disease: Secondary | ICD-10-CM | POA: Diagnosis not present

## 2020-09-01 DIAGNOSIS — E1029 Type 1 diabetes mellitus with other diabetic kidney complication: Secondary | ICD-10-CM | POA: Diagnosis not present

## 2020-09-03 ENCOUNTER — Ambulatory Visit (INDEPENDENT_AMBULATORY_CARE_PROVIDER_SITE_OTHER): Payer: Medicare HMO

## 2020-09-03 ENCOUNTER — Other Ambulatory Visit: Payer: Self-pay

## 2020-09-03 DIAGNOSIS — R0600 Dyspnea, unspecified: Secondary | ICD-10-CM | POA: Diagnosis not present

## 2020-09-03 DIAGNOSIS — Z719 Counseling, unspecified: Secondary | ICD-10-CM

## 2020-09-03 DIAGNOSIS — J90 Pleural effusion, not elsewhere classified: Secondary | ICD-10-CM

## 2020-09-03 DIAGNOSIS — R0902 Hypoxemia: Secondary | ICD-10-CM | POA: Diagnosis not present

## 2020-09-03 DIAGNOSIS — Z5189 Encounter for other specified aftercare: Secondary | ICD-10-CM

## 2020-09-03 NOTE — Progress Notes (Signed)
Patient arrived forrightPleurX catheter drainage. She was last drainedThursdayin the office. She is s/p PleurX catheter insertion with Dr. Roxan Hockey 07/04/20. Drained in the office today,yielded 400 ml's ofamberpleuralfluidwith some froth. Patient tolerated well.New dressing in place.Patient will come back to the office againThursday, to be drained.If she becomes short of breath in the meantime, she is to contact the office for additionalinstructions.

## 2020-09-04 DIAGNOSIS — D689 Coagulation defect, unspecified: Secondary | ICD-10-CM | POA: Diagnosis not present

## 2020-09-04 DIAGNOSIS — D631 Anemia in chronic kidney disease: Secondary | ICD-10-CM | POA: Diagnosis not present

## 2020-09-04 DIAGNOSIS — E1029 Type 1 diabetes mellitus with other diabetic kidney complication: Secondary | ICD-10-CM | POA: Diagnosis not present

## 2020-09-04 DIAGNOSIS — D509 Iron deficiency anemia, unspecified: Secondary | ICD-10-CM | POA: Diagnosis not present

## 2020-09-04 DIAGNOSIS — Z992 Dependence on renal dialysis: Secondary | ICD-10-CM | POA: Diagnosis not present

## 2020-09-04 DIAGNOSIS — N186 End stage renal disease: Secondary | ICD-10-CM | POA: Diagnosis not present

## 2020-09-04 DIAGNOSIS — N2581 Secondary hyperparathyroidism of renal origin: Secondary | ICD-10-CM | POA: Diagnosis not present

## 2020-09-06 ENCOUNTER — Ambulatory Visit (INDEPENDENT_AMBULATORY_CARE_PROVIDER_SITE_OTHER): Payer: Medicare HMO

## 2020-09-06 ENCOUNTER — Other Ambulatory Visit: Payer: Self-pay

## 2020-09-06 DIAGNOSIS — J90 Pleural effusion, not elsewhere classified: Secondary | ICD-10-CM | POA: Diagnosis not present

## 2020-09-06 DIAGNOSIS — N2581 Secondary hyperparathyroidism of renal origin: Secondary | ICD-10-CM | POA: Diagnosis not present

## 2020-09-06 DIAGNOSIS — N186 End stage renal disease: Secondary | ICD-10-CM | POA: Diagnosis not present

## 2020-09-06 DIAGNOSIS — Z5189 Encounter for other specified aftercare: Secondary | ICD-10-CM

## 2020-09-06 DIAGNOSIS — D509 Iron deficiency anemia, unspecified: Secondary | ICD-10-CM | POA: Diagnosis not present

## 2020-09-06 DIAGNOSIS — E1029 Type 1 diabetes mellitus with other diabetic kidney complication: Secondary | ICD-10-CM | POA: Diagnosis not present

## 2020-09-06 DIAGNOSIS — Z992 Dependence on renal dialysis: Secondary | ICD-10-CM | POA: Diagnosis not present

## 2020-09-06 DIAGNOSIS — D631 Anemia in chronic kidney disease: Secondary | ICD-10-CM | POA: Diagnosis not present

## 2020-09-06 DIAGNOSIS — Z719 Counseling, unspecified: Secondary | ICD-10-CM

## 2020-09-06 DIAGNOSIS — D689 Coagulation defect, unspecified: Secondary | ICD-10-CM | POA: Diagnosis not present

## 2020-09-06 NOTE — Progress Notes (Signed)
Patient arrived forrightPleurX catheter drainage. She was last drainedMondayin the office. She is s/p PleurX catheter insertion with Dr. Roxan Hockey 07/04/20. Drained in the office today,yielded375 ml's ofamberpleuralfluidwithsomefroth. Patient tolerated well.New dressing in place.Patient will come back to the office againMonday, to be drained.If she becomes short of breath in the meantime, she is to contact the office for additionalinstructions.

## 2020-09-08 DIAGNOSIS — D509 Iron deficiency anemia, unspecified: Secondary | ICD-10-CM | POA: Diagnosis not present

## 2020-09-08 DIAGNOSIS — D689 Coagulation defect, unspecified: Secondary | ICD-10-CM | POA: Diagnosis not present

## 2020-09-08 DIAGNOSIS — E1029 Type 1 diabetes mellitus with other diabetic kidney complication: Secondary | ICD-10-CM | POA: Diagnosis not present

## 2020-09-08 DIAGNOSIS — Z992 Dependence on renal dialysis: Secondary | ICD-10-CM | POA: Diagnosis not present

## 2020-09-08 DIAGNOSIS — N186 End stage renal disease: Secondary | ICD-10-CM | POA: Diagnosis not present

## 2020-09-08 DIAGNOSIS — D631 Anemia in chronic kidney disease: Secondary | ICD-10-CM | POA: Diagnosis not present

## 2020-09-08 DIAGNOSIS — N2581 Secondary hyperparathyroidism of renal origin: Secondary | ICD-10-CM | POA: Diagnosis not present

## 2020-09-10 ENCOUNTER — Ambulatory Visit (INDEPENDENT_AMBULATORY_CARE_PROVIDER_SITE_OTHER): Payer: Medicare HMO | Admitting: *Deleted

## 2020-09-10 ENCOUNTER — Other Ambulatory Visit: Payer: Self-pay

## 2020-09-10 DIAGNOSIS — Z5189 Encounter for other specified aftercare: Secondary | ICD-10-CM

## 2020-09-10 DIAGNOSIS — J90 Pleural effusion, not elsewhere classified: Secondary | ICD-10-CM | POA: Diagnosis not present

## 2020-09-10 DIAGNOSIS — Z719 Counseling, unspecified: Secondary | ICD-10-CM

## 2020-09-10 NOTE — Progress Notes (Signed)
Pt arrived today for nurse visit to drain right pleural drainage catheter. 374m of light yellow fluid drained from catheter. No c/o SOB or pain. Pt did have a tooth extracted this morning. Appt scheduled for Thursday for next drainage. Pt will call if anything arises.

## 2020-09-11 DIAGNOSIS — D509 Iron deficiency anemia, unspecified: Secondary | ICD-10-CM | POA: Diagnosis not present

## 2020-09-11 DIAGNOSIS — E1029 Type 1 diabetes mellitus with other diabetic kidney complication: Secondary | ICD-10-CM | POA: Diagnosis not present

## 2020-09-11 DIAGNOSIS — N2581 Secondary hyperparathyroidism of renal origin: Secondary | ICD-10-CM | POA: Diagnosis not present

## 2020-09-11 DIAGNOSIS — D689 Coagulation defect, unspecified: Secondary | ICD-10-CM | POA: Diagnosis not present

## 2020-09-11 DIAGNOSIS — N186 End stage renal disease: Secondary | ICD-10-CM | POA: Diagnosis not present

## 2020-09-11 DIAGNOSIS — Z992 Dependence on renal dialysis: Secondary | ICD-10-CM | POA: Diagnosis not present

## 2020-09-11 DIAGNOSIS — D631 Anemia in chronic kidney disease: Secondary | ICD-10-CM | POA: Diagnosis not present

## 2020-09-12 ENCOUNTER — Ambulatory Visit: Payer: Medicare HMO

## 2020-09-12 DIAGNOSIS — R0600 Dyspnea, unspecified: Secondary | ICD-10-CM | POA: Diagnosis not present

## 2020-09-12 DIAGNOSIS — R0902 Hypoxemia: Secondary | ICD-10-CM | POA: Diagnosis not present

## 2020-09-13 ENCOUNTER — Ambulatory Visit: Payer: Medicare HMO

## 2020-09-13 ENCOUNTER — Ambulatory Visit: Payer: Medicare HMO | Admitting: *Deleted

## 2020-09-13 ENCOUNTER — Other Ambulatory Visit: Payer: Self-pay

## 2020-09-13 DIAGNOSIS — J90 Pleural effusion, not elsewhere classified: Secondary | ICD-10-CM | POA: Diagnosis not present

## 2020-09-13 DIAGNOSIS — Z719 Counseling, unspecified: Secondary | ICD-10-CM | POA: Diagnosis not present

## 2020-09-13 DIAGNOSIS — D509 Iron deficiency anemia, unspecified: Secondary | ICD-10-CM | POA: Diagnosis not present

## 2020-09-13 DIAGNOSIS — D631 Anemia in chronic kidney disease: Secondary | ICD-10-CM | POA: Diagnosis not present

## 2020-09-13 DIAGNOSIS — Z5189 Encounter for other specified aftercare: Secondary | ICD-10-CM

## 2020-09-13 DIAGNOSIS — N186 End stage renal disease: Secondary | ICD-10-CM | POA: Diagnosis not present

## 2020-09-13 DIAGNOSIS — Z992 Dependence on renal dialysis: Secondary | ICD-10-CM | POA: Diagnosis not present

## 2020-09-13 DIAGNOSIS — N2581 Secondary hyperparathyroidism of renal origin: Secondary | ICD-10-CM | POA: Diagnosis not present

## 2020-09-13 DIAGNOSIS — E1029 Type 1 diabetes mellitus with other diabetic kidney complication: Secondary | ICD-10-CM | POA: Diagnosis not present

## 2020-09-13 DIAGNOSIS — D689 Coagulation defect, unspecified: Secondary | ICD-10-CM | POA: Diagnosis not present

## 2020-09-13 NOTE — Progress Notes (Signed)
Ms. Beyda arrived for nurse visit to drain pleurx catheter. 1109m of light yellow fluid drained from right catheter. No c/o pain or SOB. Pt scheduled to come back on Monday for next draining.

## 2020-09-15 DIAGNOSIS — E1029 Type 1 diabetes mellitus with other diabetic kidney complication: Secondary | ICD-10-CM | POA: Diagnosis not present

## 2020-09-15 DIAGNOSIS — D509 Iron deficiency anemia, unspecified: Secondary | ICD-10-CM | POA: Diagnosis not present

## 2020-09-15 DIAGNOSIS — N186 End stage renal disease: Secondary | ICD-10-CM | POA: Diagnosis not present

## 2020-09-15 DIAGNOSIS — N2581 Secondary hyperparathyroidism of renal origin: Secondary | ICD-10-CM | POA: Diagnosis not present

## 2020-09-15 DIAGNOSIS — Z992 Dependence on renal dialysis: Secondary | ICD-10-CM | POA: Diagnosis not present

## 2020-09-15 DIAGNOSIS — D631 Anemia in chronic kidney disease: Secondary | ICD-10-CM | POA: Diagnosis not present

## 2020-09-15 DIAGNOSIS — D689 Coagulation defect, unspecified: Secondary | ICD-10-CM | POA: Diagnosis not present

## 2020-09-17 ENCOUNTER — Other Ambulatory Visit: Payer: Self-pay

## 2020-09-17 ENCOUNTER — Ambulatory Visit (INDEPENDENT_AMBULATORY_CARE_PROVIDER_SITE_OTHER): Payer: Medicare HMO | Admitting: *Deleted

## 2020-09-17 DIAGNOSIS — Z719 Counseling, unspecified: Secondary | ICD-10-CM

## 2020-09-17 DIAGNOSIS — J90 Pleural effusion, not elsewhere classified: Secondary | ICD-10-CM

## 2020-09-17 DIAGNOSIS — N186 End stage renal disease: Secondary | ICD-10-CM | POA: Diagnosis not present

## 2020-09-17 DIAGNOSIS — E1029 Type 1 diabetes mellitus with other diabetic kidney complication: Secondary | ICD-10-CM | POA: Diagnosis not present

## 2020-09-17 DIAGNOSIS — Z992 Dependence on renal dialysis: Secondary | ICD-10-CM | POA: Diagnosis not present

## 2020-09-17 DIAGNOSIS — Z5189 Encounter for other specified aftercare: Secondary | ICD-10-CM

## 2020-09-17 NOTE — Progress Notes (Signed)
[name] arrived for RN visit to drain right pleural drainage catheter. 452m light yellow fluid drained from catheter. No c/o SOB or pain. Pt to return Thursday for next draining.

## 2020-09-18 DIAGNOSIS — J918 Pleural effusion in other conditions classified elsewhere: Secondary | ICD-10-CM | POA: Diagnosis not present

## 2020-09-18 DIAGNOSIS — N186 End stage renal disease: Secondary | ICD-10-CM | POA: Diagnosis not present

## 2020-09-18 DIAGNOSIS — Z992 Dependence on renal dialysis: Secondary | ICD-10-CM | POA: Diagnosis not present

## 2020-09-18 DIAGNOSIS — E119 Type 2 diabetes mellitus without complications: Secondary | ICD-10-CM | POA: Diagnosis not present

## 2020-09-18 DIAGNOSIS — I509 Heart failure, unspecified: Secondary | ICD-10-CM | POA: Diagnosis not present

## 2020-09-18 DIAGNOSIS — N2581 Secondary hyperparathyroidism of renal origin: Secondary | ICD-10-CM | POA: Diagnosis not present

## 2020-09-18 DIAGNOSIS — D509 Iron deficiency anemia, unspecified: Secondary | ICD-10-CM | POA: Diagnosis not present

## 2020-09-18 DIAGNOSIS — I10 Essential (primary) hypertension: Secondary | ICD-10-CM | POA: Diagnosis not present

## 2020-09-18 DIAGNOSIS — D689 Coagulation defect, unspecified: Secondary | ICD-10-CM | POA: Diagnosis not present

## 2020-09-20 ENCOUNTER — Other Ambulatory Visit: Payer: Self-pay

## 2020-09-20 ENCOUNTER — Ambulatory Visit (INDEPENDENT_AMBULATORY_CARE_PROVIDER_SITE_OTHER): Payer: Medicare HMO

## 2020-09-20 DIAGNOSIS — J90 Pleural effusion, not elsewhere classified: Secondary | ICD-10-CM

## 2020-09-20 DIAGNOSIS — Z992 Dependence on renal dialysis: Secondary | ICD-10-CM | POA: Diagnosis not present

## 2020-09-20 DIAGNOSIS — N186 End stage renal disease: Secondary | ICD-10-CM | POA: Diagnosis not present

## 2020-09-20 DIAGNOSIS — Z719 Counseling, unspecified: Secondary | ICD-10-CM | POA: Diagnosis not present

## 2020-09-20 DIAGNOSIS — D689 Coagulation defect, unspecified: Secondary | ICD-10-CM | POA: Diagnosis not present

## 2020-09-20 DIAGNOSIS — D509 Iron deficiency anemia, unspecified: Secondary | ICD-10-CM | POA: Diagnosis not present

## 2020-09-20 DIAGNOSIS — N2581 Secondary hyperparathyroidism of renal origin: Secondary | ICD-10-CM | POA: Diagnosis not present

## 2020-09-20 DIAGNOSIS — Z5189 Encounter for other specified aftercare: Secondary | ICD-10-CM

## 2020-09-20 NOTE — Progress Notes (Signed)
Patient arrived forrightPleurX catheter drainage. She was last drainedMondayin the office. She is s/p PleurX catheter insertion with Dr. Roxan Hockey 07/04/20. Drained in the office today,yielded446m's ofamberpleuralfluidwithsomefroth. Patient tolerated well.New dressing in place.Patient will come back to the office againTuesday, to be drained and have follow-up appointment with Dr. HRoxan HockeyIf she becomes short of breath in the meantime, she is to contact the office for additionalinstructions.

## 2020-09-22 DIAGNOSIS — Z992 Dependence on renal dialysis: Secondary | ICD-10-CM | POA: Diagnosis not present

## 2020-09-22 DIAGNOSIS — N186 End stage renal disease: Secondary | ICD-10-CM | POA: Diagnosis not present

## 2020-09-22 DIAGNOSIS — N2581 Secondary hyperparathyroidism of renal origin: Secondary | ICD-10-CM | POA: Diagnosis not present

## 2020-09-22 DIAGNOSIS — D509 Iron deficiency anemia, unspecified: Secondary | ICD-10-CM | POA: Diagnosis not present

## 2020-09-22 DIAGNOSIS — D689 Coagulation defect, unspecified: Secondary | ICD-10-CM | POA: Diagnosis not present

## 2020-09-23 NOTE — Progress Notes (Signed)
ADVANCED HF CLINIC NOTE  Referring Physician: Dr. Roxan Hockey Primary Care: Trey Sailors, [place] Primary Cardiologist: None Nephrology: Dr. Louie Boston Vascular Surgeon: Dr. Scot Dock HF Cardiologist: Dr. Haroldine Laws  HPI:  [name] is a 45 year old woman with ESRD on HD, type 1 diabetes complicated by retinopathy neuropathy, HTN, HL and severe right heart failure with ascites and recurrent right pleural effusion, referred by Dr. Roxan Hockey for further evaluation of her RV failure.   Admitted in 7/21 with PNA and sepsis. Had positive BCx. Underwent TEE that showed severe RV failure with severe TR. No vegetation.   Echo 7/21: EF 55-60% mild LVH. G2 DD. RV mildly dilated with normal function. Severe TR. RVSP ~ 65mHG  TEE 7/21: EF 60-65% RV severely enlarged and moderately HK. Severe TR. No vegetation,.    Found to have a large right pleural effusion in 11/21.  She had thoracentesis which drained 900 mL of fluid and then had a pigtail catheter placed.  Her lung was incompletely reexpanded.  The fluid was borderline between transudate and exudate. In addition she also has significant ascites.  A repeat thoracentesis was done on 06/22/2020. She was seen by [name] Pleurex placed and referred to HF Clinic.   HD-dependent since 2011. Abdominal swelling started in Spring 2021, around same time she had fistula revision in right arm. When she saw me, I suspected high-output RV failure due to AVF.   RHC 1/22 showed mild PAH with severe RV failure. RA 27 PA 42/23 (32) PCW 16 Fick 6.3/4.0 PVR 2.4 WU. Minimal change with transient occlusion of AVF with BP cuff.  Here for routine f/u. Feels some better. Pleurex helping her breathing. Draining 2x/week. Mondays ~450cc and Thursday 100-300cc. Weight still the same. Leg edema improved. Belly still bloated. Can do ADLs ok. I d/w Dr. GMoshe Cipro They have tried to pull more fluid off in HD but has severe cramping.     Past Medical  History:  Diagnosis Date  . Anemia   . Arthritis   . Ascites 03/28/2020  . Diabetes mellitus   . Diabetic retinopathy ([place]    Hx of laser Rx's  . DM type 1 (diabetes mellitus, type 1) [place] 09/12/2014   Age of onset for DM type 1 was age 89     . Enlarged thyroid gland   . [place]D on hemodialysis ([place]    ESRD due to DM type I, age of onset DM 1 was age 89  Went on dialysis in March 2012.  [facility]    .[place]ESRD [place] hemodialysis [place] 09[place]   ESRD due to DM type 1.  [facility]  Gets HD there on a TTS schedule.    .Marland KitchenGERD (gastroesophageal reflux disease)   . History of blood transfusion   . Hyperlipidemia   . Hypertension   . Peripheral vascular disease ([place]   . Pneumonia     Current Outpatient Medications  Medication Sig Dispense Refill  . amLODipine (NORVASC) 10 MG tablet Take 10 mg by mouth at bedtime.    .Marland KitchenGLUCAGON EMERGENCY 1 MG injection Inject 1 mg into the muscle once as needed (for severe low blood sugar).     . Insulin Human (INSULIN PUMP) SOLN Inject into the skin continuous. Humalog   Basal rate 0.29 units/ hour    . labetalol (NORMODYNE) 300 MG tablet Take 300 mg by mouth once.    .Marland Kitchenlosartan (COZAAR) 50 MG  tablet Take 50 mg by mouth at bedtime.     No current facility-administered medications for this encounter.    Allergies  Allergen Reactions  . Pollen Extract Other (See Comments)    Watery eyes      Social History   Socioeconomic History  . Marital status: Single    Spouse name: Not on file  . Number of children: 1  . Years of education: [employer]'S  . Highest education level: Not on file  Occupational History  . Not on file  Tobacco Use  . Smoking status: Never Smoker  . Smokeless tobacco: Never Used  Vaping Use  . Vaping Use: Never used  Substance and Sexual Activity  . Alcohol use: No    Alcohol/week: 0.0 standard drinks  . Drug use: No  . Sexual activity: Not Currently  Other  Topics Concern  . Not on file  Social History Narrative   ** Merged History Encounter **       Patient is single with 1 child. Patient is right handed. Patient has BS degree. Patient drinks 0 caffeine.   Social Determinants of Health   Financial Resource Strain: Not on file  Food Insecurity: Not on file  Transportation Needs: Not on file  Physical Activity: Not on file  Stress: Not on file  Social Connections: Not on file  Intimate Partner Violence: Not on file      Family History  Problem Relation Age of Onset  . Cancer Mother        breast  . Hypertension Father    Wt Readings from Last 3 Encounters:  09/24/20 56.1 kg (123 lb 9.6 oz)  08/21/20 55 kg (121 lb 3.2 oz)  08/01/20 55.5 kg (122 lb 5.7 oz)    Vitals:   09/24/20 1127  BP: (!) 146/70  Pulse: 77  SpO2: 98%  Weight: 56.1 kg (123 lb 9.6 oz)    PHYSICAL EXAM: General:  Thin, frail- appearing AAF. No respiratory difficulty. HEENT: normal Neck: supple. JVP to jaw with prominent CV waves Carotids 2+ bilat; no bruits. No lymphadenopathy or thryomegaly appreciated. Cor: PMI nondisplaced. Regular rate & rhythm. 2/6 TR Lungs: clear Abdomen: soft, nontender, ++ distended. No hepatosplenomegaly. No bruits or masses. Good bowel sounds. Extremities: no cyanosis, clubbing, rash, 1+ edema Failed LUE fistula, RUE fistula +bruit/thrill. Neuro: alert & orientedx3, cranial nerves grossly intact. moves all 4 extremities w/o difficulty. Affect pleasant  ASSESSMENT & PLAN:  1. PAH with Cor Pulmonale with ascites and recurrent R pleural effusion. - Echo 7/21: EF 55-60% mild LVH. G2 DD. RV mildly dilated with normal function. Severe TR. RVSP ~ 65mHG - TEE 7/21: EF 60-65% RV severely enlarged and moderately HK. Severe TR. No vegetation. - VQ 1/22: No PE - Rheumatoid serologies negative except for elevated RF (felt to be nonspecific) - WHO Group I PAH in setting of ESRD possible but given time course of symptoms (following  AVF extension) more likely high output failure from AVF.  - RHC 1/22 showed mild PAH with severe RV failure. RA 27 PA 42/23 (32) PCW 16 Fick 6.3/4.0 PVR 2.4 WU. Minimal change with transient occlusion of AVF with BP cuff. - symptomatically improved with Pleurex catheter but still with prominent RV failure. Suspect this is related to high-output from AVF. I think it is worth trying to modify the AVF to reduce flow.  I have d/w Dr. GMoshe Ciprowho is in agreement. I will d/w Dr. DScot Dock- No role for selective pulmonary artery vasodilators in  this setting   2. Recurrent pleural effusion - Secondary to #1 - Much improved s/p Pleurex tube placement with Dr. Roxan Hockey.  3. DM1 - per PCP.  4. ESRD on HD - per Renal. - D/w Dr. Verita Schneiders, MD  7:11 PM

## 2020-09-24 ENCOUNTER — Encounter (HOSPITAL_COMMUNITY): Payer: Self-pay | Admitting: Cardiology

## 2020-09-24 ENCOUNTER — Encounter (HOSPITAL_COMMUNITY): Payer: Self-pay | Admitting: Internal Medicine

## 2020-09-24 ENCOUNTER — Other Ambulatory Visit: Payer: Self-pay | Admitting: Thoracic Surgery (Cardiothoracic Vascular Surgery)

## 2020-09-24 ENCOUNTER — Other Ambulatory Visit: Payer: Self-pay

## 2020-09-24 ENCOUNTER — Ambulatory Visit (HOSPITAL_COMMUNITY)
Admission: RE | Admit: 2020-09-24 | Discharge: 2020-09-24 | Disposition: A | Discharge status: Home / Self Care | Payer: Medicare HMO | Source: Ambulatory Visit | Attending: Internal Medicine | Admitting: Internal Medicine

## 2020-09-24 ENCOUNTER — Ambulatory Visit: Payer: Medicare HMO

## 2020-09-24 VITALS — BP 146/70 | HR 77 | Wt 123.6 lb

## 2020-09-24 DIAGNOSIS — Z794 Long term (current) use of insulin: Secondary | ICD-10-CM | POA: Insufficient documentation

## 2020-09-24 DIAGNOSIS — E1022 Type 1 diabetes mellitus with diabetic chronic kidney disease: Secondary | ICD-10-CM | POA: Diagnosis not present

## 2020-09-24 DIAGNOSIS — N186 End stage renal disease: Secondary | ICD-10-CM | POA: Diagnosis not present

## 2020-09-24 DIAGNOSIS — I272 Pulmonary hypertension, unspecified: Secondary | ICD-10-CM

## 2020-09-24 DIAGNOSIS — Z7901 Long term (current) use of anticoagulants: Secondary | ICD-10-CM | POA: Diagnosis not present

## 2020-09-24 DIAGNOSIS — E10319 Type 1 diabetes mellitus with unspecified diabetic retinopathy without macular edema: Secondary | ICD-10-CM | POA: Diagnosis not present

## 2020-09-24 DIAGNOSIS — I132 Hypertensive heart and chronic kidney disease with heart failure and with stage 5 chronic kidney disease, or end stage renal disease: Secondary | ICD-10-CM | POA: Insufficient documentation

## 2020-09-24 DIAGNOSIS — J90 Pleural effusion, not elsewhere classified: Secondary | ICD-10-CM

## 2020-09-24 DIAGNOSIS — R1900 Intra-abdominal and pelvic swelling, mass and lump, unspecified site: Secondary | ICD-10-CM | POA: Diagnosis not present

## 2020-09-24 DIAGNOSIS — I50810 Right heart failure, unspecified: Secondary | ICD-10-CM | POA: Diagnosis not present

## 2020-09-24 DIAGNOSIS — Z79899 Other long term (current) drug therapy: Secondary | ICD-10-CM | POA: Insufficient documentation

## 2020-09-24 DIAGNOSIS — Z992 Dependence on renal dialysis: Secondary | ICD-10-CM

## 2020-09-24 HISTORY — DX: Heart failure, unspecified: I50.9

## 2020-09-24 NOTE — Patient Instructions (Addendum)
It was great to see you today! No medication changes are needed at this time.  Your physician recommends that you schedule a follow-up appointment in: 4 months with Dr Haroldine Laws -please call our office closer this this time to schedule an appointment  At the [facility], you and your health needs are our priority. As part of our continuing mission to provide you with exceptional heart care, we have created designated Provider Care Teams. These Care Teams include your primary Cardiologist (physician) and Advanced Practice Providers (APPs- Physician Assistants and Nurse Practitioners) who all work together to provide you with the care you need, when you need it.   You may see any of the following providers on your designated Care Team at your next follow up: Marland Kitchen Dr Glori Bickers . Dr Loralie Champagne . Dr Vickki Muff . Darrick Grinder, NP . Lyda Jester, [place] . Audry Riles, PharmD   Please be sure to bring in all your medications bottles to every appointment.   Do the following things EVERYDAY: 1) Weigh yourself in the morning before breakfast. Write it down and keep it in a log. 2) Take your medicines as prescribed 3) Eat low salt foods--Limit salt (sodium) to 2000 mg per day.  4) Stay as active as you can everyday 5) Limit all fluids for the day to less than 2 liters

## 2020-09-25 ENCOUNTER — Encounter: Payer: Self-pay | Admitting: Thoracic Surgery (Cardiothoracic Vascular Surgery)

## 2020-09-25 ENCOUNTER — Ambulatory Visit (INDEPENDENT_AMBULATORY_CARE_PROVIDER_SITE_OTHER): Payer: Medicare HMO | Admitting: Thoracic Surgery (Cardiothoracic Vascular Surgery)

## 2020-09-25 ENCOUNTER — Ambulatory Visit
Admission: RE | Admit: 2020-09-25 | Discharge: 2020-09-25 | Disposition: A | Discharge status: Home / Self Care | Payer: Medicare HMO | Source: Ambulatory Visit | Attending: Thoracic Surgery (Cardiothoracic Vascular Surgery) | Admitting: Thoracic Surgery (Cardiothoracic Vascular Surgery)

## 2020-09-25 VITALS — BP 136/77 | HR 85 | Resp 20 | Ht 63.0 in | Wt 116.0 lb

## 2020-09-25 DIAGNOSIS — J90 Pleural effusion, not elsewhere classified: Secondary | ICD-10-CM

## 2020-09-25 DIAGNOSIS — Z992 Dependence on renal dialysis: Secondary | ICD-10-CM | POA: Diagnosis not present

## 2020-09-25 DIAGNOSIS — J984 Other disorders of lung: Secondary | ICD-10-CM | POA: Diagnosis not present

## 2020-09-25 DIAGNOSIS — J929 Pleural plaque without asbestos: Secondary | ICD-10-CM | POA: Diagnosis not present

## 2020-09-25 DIAGNOSIS — R918 Other nonspecific abnormal finding of lung field: Secondary | ICD-10-CM | POA: Diagnosis not present

## 2020-09-25 DIAGNOSIS — J9811 Atelectasis: Secondary | ICD-10-CM | POA: Diagnosis not present

## 2020-09-25 DIAGNOSIS — D631 Anemia in chronic kidney disease: Secondary | ICD-10-CM | POA: Diagnosis not present

## 2020-09-25 DIAGNOSIS — D689 Coagulation defect, unspecified: Secondary | ICD-10-CM | POA: Diagnosis not present

## 2020-09-25 DIAGNOSIS — N2581 Secondary hyperparathyroidism of renal origin: Secondary | ICD-10-CM | POA: Diagnosis not present

## 2020-09-25 DIAGNOSIS — D509 Iron deficiency anemia, unspecified: Secondary | ICD-10-CM | POA: Diagnosis not present

## 2020-09-25 DIAGNOSIS — N186 End stage renal disease: Secondary | ICD-10-CM | POA: Diagnosis not present

## 2020-09-25 NOTE — Progress Notes (Signed)
[address]       [place] [address]             [phone]     HPI: Ms. Dekle returns for follow-up of her right pleural effusion and pleural catheter.  Tarajah Mckernan is a 45 year old woman with renal failure on hemodialysis with a history of type 1 diabetes, diabetic retinopathy, diabetic neuropathy, right heart failure, severe tricuspid regurgitation, hypertension, hyperlipidemia, ascites, and recurrent right pleural effusions.  She had a pleural catheter placed on 07/04/2020 for palliative management of her pleural effusion.  She has been draining that twice a week.  She typically drains around 450 mL each time.  She saw Dr. Haroldine Laws yesterday and he feels like this may be due to excessive flow through her AV fistula.  She will see vascular surgery about that.  Her breathing has improved significantly since placement of the catheter.  Her ascites remains problematic.  Past Medical History:  Diagnosis Date  . Anemia   . Arthritis   . Ascites 03/28/2020  . CHF (congestive heart failure) ([place])   . Diabetes mellitus   . Diabetic retinopathy ([place])    Hx of laser Rx's  . DM type 1 (diabetes mellitus, type 1) ([place]) 09/12/2014   Age of onset for DM type 1 was age 12.     . Enlarged thyroid gland   . ESRD on hemodialysis ([place])    ESRD due to DM type I, age of onset DM 1 was age 42.  Went on dialysis in March 2012.  Gets HD now at [facility] on a TTS schedule.    Marland Kitchen ESRD on hemodialysis ([place]) 09/12/2014   ESRD due to DM type 1.  Started HD in 2012 at [facility].  Gets HD there on a TTS schedule.    Marland Kitchen GERD (gastroesophageal reflux disease)   . History of blood transfusion   . Hyperlipidemia   . Hypertension   . Peripheral vascular disease ([place])   . Pneumonia     Current Outpatient Medications  Medication Sig Dispense Refill  . amLODipine (NORVASC) 10 MG tablet Take 10 mg by mouth at bedtime.    Marland Kitchen GLUCAGON EMERGENCY 1 MG injection Inject 1 mg into the  muscle once as needed (for severe low blood sugar).     . Insulin Human (INSULIN PUMP) SOLN Inject into the skin continuous. Humalog   Basal rate 0.29 units/ hour    . labetalol (NORMODYNE) 300 MG tablet Take 300 mg by mouth daily.    Marland Kitchen losartan (COZAAR) 50 MG tablet Take 50 mg by mouth at bedtime.     No current facility-administered medications for this visit.    Physical Exam BP 136/77   Pulse 85   Resp 20   Ht '5\' 3"'$  (1.6 m)   Wt 116 lb (52.6 kg)   SpO2 90% Comment: RA  BMI 20.50 kg/m  45 year old woman in no acute distress Lungs clear with very slightly diminished breath sounds right base Cardiac regular rate and rhythm with a systolic murmur at left lower sternal border Catheter site clean and dry Ascites  Diagnostic Tests: CHEST - 2 VIEW  COMPARISON:  08/21/2020  FINDINGS: Right pleural thickening/field unchanged. Right pleural catheter unchanged. No pneumothorax. Right lower lobe airspace disease has progressed.  Left lung clear.  Vascularity normal.  IMPRESSION: Right pleural catheter and pleural fluid unchanged. No pneumothorax. Progressive right lower lobe/right middle lobe atelectasis.   Electronically Signed  By: Franchot Gallo M.D.   On: 09/25/2020 15:49 I personally reviewed her chest x-ray images.  I see no real significant change from her film from 08/21/2020.  Both films were done prior to drainage.  Both significantly improved from prior to catheter placement.  She drained 444m.  Impression: JCambell Cloptonis a 45year old woman with renal failure on hemodialysis with right heart failure, tricuspid regurgitation, ascites, recurrent right pleural effusions, type 1 diabetes, diabetic retinopathy, diabetic neuropathy, hypertension, and hyperlipidemia.  She was having difficulty with recurrent right pleural effusions.  I placed a pleural catheter back in December.  She is been draining twice a week.  Unfortunately we were not able to arrange a home  health nurse so she is having to come to the office on Mondays and Thursdays to drain the catheter.  She is draining about 450 mL each time.  There is not been a significant decrease, nor with one be expected until the underlying cause is addressed.  She has good symptomatic relief with the catheter.  She continues to have difficulty with ascites.  She is scheduled to see vascular surgery about possibly revising her fistula as there likely is a component of high-output failure related to that.  Plan: Continue to drain catheter twice weekly I will plan to check another x-ray in about a month  SMelrose Nakayama MD [facility] [phone]

## 2020-09-27 ENCOUNTER — Ambulatory Visit (INDEPENDENT_AMBULATORY_CARE_PROVIDER_SITE_OTHER): Payer: Medicare HMO

## 2020-09-27 ENCOUNTER — Other Ambulatory Visit: Payer: Self-pay

## 2020-09-27 DIAGNOSIS — N2581 Secondary hyperparathyroidism of renal origin: Secondary | ICD-10-CM | POA: Diagnosis not present

## 2020-09-27 DIAGNOSIS — Z719 Counseling, unspecified: Secondary | ICD-10-CM | POA: Diagnosis not present

## 2020-09-27 DIAGNOSIS — J90 Pleural effusion, not elsewhere classified: Secondary | ICD-10-CM | POA: Diagnosis not present

## 2020-09-27 DIAGNOSIS — Z992 Dependence on renal dialysis: Secondary | ICD-10-CM | POA: Diagnosis not present

## 2020-09-27 DIAGNOSIS — D509 Iron deficiency anemia, unspecified: Secondary | ICD-10-CM | POA: Diagnosis not present

## 2020-09-27 DIAGNOSIS — D631 Anemia in chronic kidney disease: Secondary | ICD-10-CM | POA: Diagnosis not present

## 2020-09-27 DIAGNOSIS — Z5189 Encounter for other specified aftercare: Secondary | ICD-10-CM

## 2020-09-27 DIAGNOSIS — N186 End stage renal disease: Secondary | ICD-10-CM | POA: Diagnosis not present

## 2020-09-27 DIAGNOSIS — D689 Coagulation defect, unspecified: Secondary | ICD-10-CM | POA: Diagnosis not present

## 2020-09-27 NOTE — Progress Notes (Signed)
Patient arrived forrightPleurX catheter drainage. She was last drainedTuesdayin the office. She is s/p PleurX catheter insertion with Dr. Roxan Hockey 07/04/20. Drained in the office today,yielded215m's ofpleuralfluid. Patient tolerated well.New dressing in place.Patient will come back to the office againMonday, to be drained.If she becomes short of breath in the meantime, she is to contact the office for additionalinstructions.

## 2020-09-28 DIAGNOSIS — E103593 Type 1 diabetes mellitus with proliferative diabetic retinopathy without macular edema, bilateral: Secondary | ICD-10-CM | POA: Diagnosis not present

## 2020-09-28 DIAGNOSIS — E1022 Type 1 diabetes mellitus with diabetic chronic kidney disease: Secondary | ICD-10-CM | POA: Diagnosis not present

## 2020-09-28 DIAGNOSIS — Z992 Dependence on renal dialysis: Secondary | ICD-10-CM | POA: Diagnosis not present

## 2020-09-28 DIAGNOSIS — N186 End stage renal disease: Secondary | ICD-10-CM | POA: Diagnosis not present

## 2020-09-29 DIAGNOSIS — D631 Anemia in chronic kidney disease: Secondary | ICD-10-CM | POA: Diagnosis not present

## 2020-09-29 DIAGNOSIS — D509 Iron deficiency anemia, unspecified: Secondary | ICD-10-CM | POA: Diagnosis not present

## 2020-09-29 DIAGNOSIS — D689 Coagulation defect, unspecified: Secondary | ICD-10-CM | POA: Diagnosis not present

## 2020-09-29 DIAGNOSIS — N2581 Secondary hyperparathyroidism of renal origin: Secondary | ICD-10-CM | POA: Diagnosis not present

## 2020-09-29 DIAGNOSIS — N186 End stage renal disease: Secondary | ICD-10-CM | POA: Diagnosis not present

## 2020-09-29 DIAGNOSIS — Z992 Dependence on renal dialysis: Secondary | ICD-10-CM | POA: Diagnosis not present

## 2020-10-01 ENCOUNTER — Ambulatory Visit (INDEPENDENT_AMBULATORY_CARE_PROVIDER_SITE_OTHER): Payer: Medicare HMO

## 2020-10-01 ENCOUNTER — Other Ambulatory Visit: Payer: Self-pay

## 2020-10-01 DIAGNOSIS — R0600 Dyspnea, unspecified: Secondary | ICD-10-CM | POA: Diagnosis not present

## 2020-10-01 DIAGNOSIS — R0902 Hypoxemia: Secondary | ICD-10-CM | POA: Diagnosis not present

## 2020-10-01 DIAGNOSIS — Z5189 Encounter for other specified aftercare: Secondary | ICD-10-CM

## 2020-10-01 DIAGNOSIS — J90 Pleural effusion, not elsewhere classified: Secondary | ICD-10-CM

## 2020-10-01 DIAGNOSIS — Z719 Counseling, unspecified: Secondary | ICD-10-CM

## 2020-10-01 NOTE — Progress Notes (Signed)
Patient arrived forrightPleurX catheter drainage. She was last drainedTuesdayin the office. She is s/p PleurX catheter insertion with Dr. Roxan Hockey 07/04/20. Drained in the office today,yielded 335m's ofpleuralfluid. Patient tolerated well.New dressing in place.Patient will come back to the office againThursday, to be drained.If she becomes short of breath in the meantime, she is to contact the office for additionalinstructions.

## 2020-10-02 DIAGNOSIS — Z992 Dependence on renal dialysis: Secondary | ICD-10-CM | POA: Diagnosis not present

## 2020-10-02 DIAGNOSIS — D509 Iron deficiency anemia, unspecified: Secondary | ICD-10-CM | POA: Diagnosis not present

## 2020-10-02 DIAGNOSIS — E1029 Type 1 diabetes mellitus with other diabetic kidney complication: Secondary | ICD-10-CM | POA: Diagnosis not present

## 2020-10-02 DIAGNOSIS — D689 Coagulation defect, unspecified: Secondary | ICD-10-CM | POA: Diagnosis not present

## 2020-10-02 DIAGNOSIS — N2581 Secondary hyperparathyroidism of renal origin: Secondary | ICD-10-CM | POA: Diagnosis not present

## 2020-10-02 DIAGNOSIS — N186 End stage renal disease: Secondary | ICD-10-CM | POA: Diagnosis not present

## 2020-10-04 ENCOUNTER — Other Ambulatory Visit: Payer: Self-pay

## 2020-10-04 ENCOUNTER — Ambulatory Visit (INDEPENDENT_AMBULATORY_CARE_PROVIDER_SITE_OTHER): Payer: Medicare HMO

## 2020-10-04 DIAGNOSIS — J90 Pleural effusion, not elsewhere classified: Secondary | ICD-10-CM | POA: Diagnosis not present

## 2020-10-04 DIAGNOSIS — N186 End stage renal disease: Secondary | ICD-10-CM | POA: Diagnosis not present

## 2020-10-04 DIAGNOSIS — Z992 Dependence on renal dialysis: Secondary | ICD-10-CM | POA: Diagnosis not present

## 2020-10-04 DIAGNOSIS — E1029 Type 1 diabetes mellitus with other diabetic kidney complication: Secondary | ICD-10-CM | POA: Diagnosis not present

## 2020-10-04 DIAGNOSIS — D509 Iron deficiency anemia, unspecified: Secondary | ICD-10-CM | POA: Diagnosis not present

## 2020-10-04 DIAGNOSIS — N2581 Secondary hyperparathyroidism of renal origin: Secondary | ICD-10-CM | POA: Diagnosis not present

## 2020-10-04 DIAGNOSIS — D689 Coagulation defect, unspecified: Secondary | ICD-10-CM | POA: Diagnosis not present

## 2020-10-04 DIAGNOSIS — Z719 Counseling, unspecified: Secondary | ICD-10-CM

## 2020-10-04 DIAGNOSIS — Z5189 Encounter for other specified aftercare: Secondary | ICD-10-CM

## 2020-10-04 NOTE — Progress Notes (Signed)
Patient arrived forrightPleurX catheter drainage. She was last drainedTuesdayin the office. She is s/p PleurX catheter insertion with Dr. Roxan Hockey 07/04/20. Drained in the office today,yielded 21m's ofpleuralfluid. Patient tolerated well.New dressing in place.Patient will come back to the office againMonday, to be drained.If she becomes short of breath in the meantime, she is to contact the office for additionalinstructions.

## 2020-10-06 DIAGNOSIS — D689 Coagulation defect, unspecified: Secondary | ICD-10-CM | POA: Diagnosis not present

## 2020-10-06 DIAGNOSIS — N2581 Secondary hyperparathyroidism of renal origin: Secondary | ICD-10-CM | POA: Diagnosis not present

## 2020-10-06 DIAGNOSIS — N186 End stage renal disease: Secondary | ICD-10-CM | POA: Diagnosis not present

## 2020-10-06 DIAGNOSIS — E1029 Type 1 diabetes mellitus with other diabetic kidney complication: Secondary | ICD-10-CM | POA: Diagnosis not present

## 2020-10-06 DIAGNOSIS — Z992 Dependence on renal dialysis: Secondary | ICD-10-CM | POA: Diagnosis not present

## 2020-10-06 DIAGNOSIS — D509 Iron deficiency anemia, unspecified: Secondary | ICD-10-CM | POA: Diagnosis not present

## 2020-10-08 ENCOUNTER — Ambulatory Visit (INDEPENDENT_AMBULATORY_CARE_PROVIDER_SITE_OTHER): Payer: Medicare HMO | Admitting: *Deleted

## 2020-10-08 ENCOUNTER — Other Ambulatory Visit: Payer: Self-pay

## 2020-10-08 DIAGNOSIS — E1165 Type 2 diabetes mellitus with hyperglycemia: Secondary | ICD-10-CM | POA: Diagnosis not present

## 2020-10-08 DIAGNOSIS — Z5189 Encounter for other specified aftercare: Secondary | ICD-10-CM

## 2020-10-08 DIAGNOSIS — E782 Mixed hyperlipidemia: Secondary | ICD-10-CM | POA: Diagnosis not present

## 2020-10-08 DIAGNOSIS — D638 Anemia in other chronic diseases classified elsewhere: Secondary | ICD-10-CM | POA: Diagnosis not present

## 2020-10-08 DIAGNOSIS — Z4802 Encounter for removal of sutures: Secondary | ICD-10-CM | POA: Diagnosis not present

## 2020-10-08 DIAGNOSIS — I119 Hypertensive heart disease without heart failure: Secondary | ICD-10-CM | POA: Diagnosis not present

## 2020-10-08 DIAGNOSIS — I10 Essential (primary) hypertension: Secondary | ICD-10-CM | POA: Diagnosis not present

## 2020-10-08 NOTE — Progress Notes (Signed)
[name] arrived for drainage of right pleural catheter. 155m of light yellow fluid drained without complication from right pleural catheter. Patient to return on Thursday for next draining.

## 2020-10-09 DIAGNOSIS — D631 Anemia in chronic kidney disease: Secondary | ICD-10-CM | POA: Diagnosis not present

## 2020-10-09 DIAGNOSIS — D509 Iron deficiency anemia, unspecified: Secondary | ICD-10-CM | POA: Diagnosis not present

## 2020-10-09 DIAGNOSIS — D689 Coagulation defect, unspecified: Secondary | ICD-10-CM | POA: Diagnosis not present

## 2020-10-09 DIAGNOSIS — N2581 Secondary hyperparathyroidism of renal origin: Secondary | ICD-10-CM | POA: Diagnosis not present

## 2020-10-09 DIAGNOSIS — Z992 Dependence on renal dialysis: Secondary | ICD-10-CM | POA: Diagnosis not present

## 2020-10-09 DIAGNOSIS — N186 End stage renal disease: Secondary | ICD-10-CM | POA: Diagnosis not present

## 2020-10-10 ENCOUNTER — Other Ambulatory Visit: Payer: Self-pay

## 2020-10-10 ENCOUNTER — Ambulatory Visit (INDEPENDENT_AMBULATORY_CARE_PROVIDER_SITE_OTHER): Payer: Medicare HMO | Admitting: Vascular Surgery

## 2020-10-10 ENCOUNTER — Encounter: Payer: Self-pay | Admitting: Vascular Surgery

## 2020-10-10 VITALS — BP 130/75 | HR 72 | Temp 97.7°F | Resp 20 | Ht 63.0 in | Wt 119.6 lb

## 2020-10-10 DIAGNOSIS — Z992 Dependence on renal dialysis: Secondary | ICD-10-CM | POA: Diagnosis not present

## 2020-10-10 DIAGNOSIS — N186 End stage renal disease: Secondary | ICD-10-CM

## 2020-10-10 DIAGNOSIS — R0902 Hypoxemia: Secondary | ICD-10-CM | POA: Diagnosis not present

## 2020-10-10 DIAGNOSIS — R0600 Dyspnea, unspecified: Secondary | ICD-10-CM | POA: Diagnosis not present

## 2020-10-10 NOTE — Progress Notes (Signed)
REASON FOR CONSULT:    To consider banding of right AV graft.  The consult is requested by Dr. Haroldine Laws  ASSESSMENT & PLAN:   END-STAGE RENAL DISEASE: This patient has a functioning right upper arm loop graft which was placed originally in June 2019.  Of note this was a 4-7 mm tapered graft.  Given that it is a tapered graft it would be difficult to band this without significantly increasing the risk of graft thrombosis.  In addition, the graft is somewhat pulsatile and I have recommended that we proceed with a fistulogram to further assess the graft before making a final decision about banding the proximal graft.  If she has some outflow narrowing this may actually help in terms of limiting flow through the graft given her congestive heart failure.  If she did have an outflow obstruction given that the graft is working well I would not address this.  All things considered I am not sure that banding will make a significant difference with respect to managing her heart failure and would be associated with significant risk of graft thrombosis.  I will discuss this further with Dr. Haroldine Laws after her fistulogram which is scheduled next week by one of my partners as I am out of town.   [name], MD Office: [phone]   HPI:   [name] is a pleasant 45 y.o. female, with a history of congestive heart failure who I placed a right arm AV graft on on 01/11/2018.  This was a 4-7 mm PTFE graft.  The graft has been working well although she has undergone a revision of the arterial half of the graft when she presented with a disrupted ulcer along the arterial limb of the graft.  The patient has severe right heart failure and Dr. Haroldine Laws and I have discussed potentially banding her graft to help with her heart failure.  In addition she has pulmonary effusions and is followed by Dr. Roxan Hockey and has a drain in place which is drained on Mondays and Thursdays.  She denies any recent  uremic symptoms.  Past Medical History:  Diagnosis Date  . Anemia   . Arthritis   . Ascites 03/28/2020  . CHF (congestive heart failure) ([place])   . Diabetes mellitus   . Diabetic retinopathy ([place])    Hx of laser Rx's  . DM type 1 (diabetes mellitus, type 1) ([place]) 09/12/2014   Age of onset for DM type 1 was age 10.     . Enlarged thyroid gland   . ESRD on hemodialysis ([place])    ESRD due to DM type I, age of onset DM 1 was age 80.  Went on dialysis in March 2012.  Gets HD now at [facility] on a TTS schedule.    Marland Kitchen ESRD on hemodialysis ([place]) 09/12/2014   ESRD due to DM type 1.  Started HD in 2012 at [facility].  Gets HD there on a TTS schedule.    Marland Kitchen GERD (gastroesophageal reflux disease)   . History of blood transfusion   . Hyperlipidemia   . Hypertension   . Peripheral vascular disease ([place])   . Pneumonia     Family History  Problem Relation Age of Onset  . Cancer Mother        breast  . Hypertension Father     SOCIAL HISTORY: Social History   Socioeconomic History  . Marital status: Single    Spouse name: Not on file  . Number of children:  1  . Years of education: BACHELOR'S  . Highest education level: Not on file  Occupational History  . Not on file  Tobacco Use  . Smoking status: Never Smoker  . Smokeless tobacco: Never Used  Vaping Use  . Vaping Use: Never used  Substance and Sexual Activity  . Alcohol use: No    Alcohol/week: 0.0 standard drinks  . Drug use: No  . Sexual activity: Not Currently  Other Topics Concern  . Not on file  Social History Narrative   ** Merged History Encounter **       Patient is single with 1 child. Patient is right handed. Patient has BS degree. Patient drinks 0 caffeine.   Social Determinants of Health   Financial Resource Strain: Not on file  Food Insecurity: Not on file  Transportation Needs: Not on file  Physical Activity: Not on file  Stress: Not on file  Social Connections: Not on file  Intimate Partner Violence:  Not on file    Allergies  Allergen Reactions  . Pollen Extract Other (See Comments)    Watery eyes    Current Outpatient Medications  Medication Sig Dispense Refill  . amLODipine (NORVASC) 10 MG tablet Take 10 mg by mouth at bedtime.    . calcium acetate (PHOSLO) 667 MG capsule Take 1,334 mg by mouth 2 (two) times daily.    . Cinacalcet HCl (SENSIPAR PO) Take by mouth.    Marland Kitchen GLUCAGON EMERGENCY 1 MG injection Inject 1 mg into the muscle once as needed (for severe low blood sugar).     . Insulin Human (INSULIN PUMP) SOLN Inject into the skin continuous. Humalog   Basal rate 0.29 units/ hour    . labetalol (NORMODYNE) 300 MG tablet Take 300 mg by mouth daily.    Marland Kitchen losartan (COZAAR) 50 MG tablet Take 50 mg by mouth at bedtime.    . rosuvastatin (CRESTOR) 20 MG tablet 1 tab(s)     No current facility-administered medications for this visit.    REVIEW OF SYSTEMS:  '[X]'$  denotes positive finding, '[ ]'$  denotes negative finding Cardiac  Comments:  Chest pain or chest pressure:    Shortness of breath upon exertion: x   Short of breath when lying flat:    Irregular heart rhythm:        Vascular    Pain in calf, thigh, or hip brought on by ambulation:    Pain in feet at night that wakes you up from your sleep:     Blood clot in your veins:    Leg swelling:         Pulmonary    Oxygen at home:    Productive cough:     Wheezing:         Neurologic    Sudden weakness in arms or legs:     Sudden numbness in arms or legs:     Sudden onset of difficulty speaking or slurred speech:    Temporary loss of vision in one eye:     Problems with dizziness:         Gastrointestinal    Blood in stool:     Vomited blood:         Genitourinary    Burning when urinating:     Blood in urine:        Psychiatric    Major depression:         Hematologic    Bleeding problems:    Problems with blood clotting  too easily:        Skin    Rashes or ulcers:        Constitutional    Fever or  chills:     PHYSICAL EXAM:   Vitals:   10/10/20 0925  BP: 130/75  Pulse: 72  Resp: 20  Temp: 97.7 F (36.5 C)  SpO2: 98%  Weight: 54.3 kg  Height: '5\' 3"'$  (1.6 m)    GENERAL: The patient is a well-nourished female, in no acute distress. The vital signs are documented above. CARDIAC: There is a regular rate and rhythm.  VASCULAR: She has a good thrill in her right upper arm graft although the graft is slightly pulsatile along the arterial aspect of the graft. She has a palpable right radial pulse. PULMONARY: There is good air exchange bilaterally without wheezing or rales. ABDOMEN: Soft and non-tender with normal pitched bowel sounds.  MUSCULOSKELETAL: There are no major deformities or cyanosis. NEUROLOGIC: No focal weakness or paresthesias are detected. SKIN: There are no ulcers or rashes noted. PSYCHIATRIC: The patient has a normal affect.  DATA:    LABS: Her potassium on 08/01/2020 was 5.6.

## 2020-10-10 NOTE — H&P (View-Only) (Signed)
REASON FOR CONSULT:    To consider banding of right AV graft.  The consult is requested by Dr. Haroldine Laws  ASSESSMENT & PLAN:   END-STAGE RENAL DISEASE: This patient has a functioning right upper arm loop graft which was placed originally in June 2019.  Of note this was a 4-7 mm tapered graft.  Given that it is a tapered graft it would be difficult to band this without significantly increasing the risk of graft thrombosis.  In addition, the graft is somewhat pulsatile and I have recommended that we proceed with a fistulogram to further assess the graft before making a final decision about banding the proximal graft.  If she has some outflow narrowing this may actually help in terms of limiting flow through the graft given her congestive heart failure.  If she did have an outflow obstruction given that the graft is working well I would not address this.  All things considered I am not sure that banding will make a significant difference with respect to managing her heart failure and would be associated with significant risk of graft thrombosis.  I will discuss this further with Dr. Haroldine Laws after her fistulogram which is scheduled next week by one of my partners as I am out of town.   [name], MD Office: [phone]   HPI:   [name] is a pleasant 45 y.o. female, with a history of congestive heart failure who I placed a right arm AV graft on on 01/11/2018.  This was a 4-7 mm PTFE graft.  The graft has been working well although she has undergone a revision of the arterial half of the graft when she presented with a disrupted ulcer along the arterial limb of the graft.  The patient has severe right heart failure and Dr. Haroldine Laws and I have discussed potentially banding her graft to help with her heart failure.  In addition she has pulmonary effusions and is followed by Dr. Roxan Hockey and has a drain in place which is drained on Mondays and Thursdays.  She denies any recent  uremic symptoms.  Past Medical History:  Diagnosis Date  . Anemia   . Arthritis   . Ascites 03/28/2020  . CHF (congestive heart failure) ([place])   . Diabetes mellitus   . Diabetic retinopathy ([place])    Hx of laser Rx's  . DM type 1 (diabetes mellitus, type 1) ([place]) 09/12/2014   Age of onset for DM type 1 was age 52.     . Enlarged thyroid gland   . ESRD on hemodialysis ([place])    ESRD due to DM type I, age of onset DM 1 was age 17.  Went on dialysis in March 2012.  Gets HD now at [facility] on a TTS schedule.    Marland Kitchen ESRD on hemodialysis ([place]) 09/12/2014   ESRD due to DM type 1.  Started HD in 2012 at [facility].  Gets HD there on a TTS schedule.    Marland Kitchen GERD (gastroesophageal reflux disease)   . History of blood transfusion   . Hyperlipidemia   . Hypertension   . Peripheral vascular disease ([place])   . Pneumonia     Family History  Problem Relation Age of Onset  . Cancer Mother        breast  . Hypertension Father     SOCIAL HISTORY: Social History   Socioeconomic History  . Marital status: Single    Spouse name: Not on file  . Number of children:  1  . Years of education: BACHELOR'S  . Highest education level: Not on file  Occupational History  . Not on file  Tobacco Use  . Smoking status: Never Smoker  . Smokeless tobacco: Never Used  Vaping Use  . Vaping Use: Never used  Substance and Sexual Activity  . Alcohol use: No    Alcohol/week: 0.0 standard drinks  . Drug use: No  . Sexual activity: Not Currently  Other Topics Concern  . Not on file  Social History Narrative   ** Merged History Encounter **       Patient is single with 1 child. Patient is right handed. Patient has BS degree. Patient drinks 0 caffeine.   Social Determinants of Health   Financial Resource Strain: Not on file  Food Insecurity: Not on file  Transportation Needs: Not on file  Physical Activity: Not on file  Stress: Not on file  Social Connections: Not on file  Intimate Partner Violence:  Not on file    Allergies  Allergen Reactions  . Pollen Extract Other (See Comments)    Watery eyes    Current Outpatient Medications  Medication Sig Dispense Refill  . amLODipine (NORVASC) 10 MG tablet Take 10 mg by mouth at bedtime.    . calcium acetate (PHOSLO) 667 MG capsule Take 1,334 mg by mouth 2 (two) times daily.    . Cinacalcet HCl (SENSIPAR PO) Take by mouth.    Marland Kitchen GLUCAGON EMERGENCY 1 MG injection Inject 1 mg into the muscle once as needed (for severe low blood sugar).     . Insulin Human (INSULIN PUMP) SOLN Inject into the skin continuous. Humalog   Basal rate 0.29 units/ hour    . labetalol (NORMODYNE) 300 MG tablet Take 300 mg by mouth daily.    Marland Kitchen losartan (COZAAR) 50 MG tablet Take 50 mg by mouth at bedtime.    . rosuvastatin (CRESTOR) 20 MG tablet 1 tab(s)     No current facility-administered medications for this visit.    REVIEW OF SYSTEMS:  '[X]'$  denotes positive finding, '[ ]'$  denotes negative finding Cardiac  Comments:  Chest pain or chest pressure:    Shortness of breath upon exertion: x   Short of breath when lying flat:    Irregular heart rhythm:        Vascular    Pain in calf, thigh, or hip brought on by ambulation:    Pain in feet at night that wakes you up from your sleep:     Blood clot in your veins:    Leg swelling:         Pulmonary    Oxygen at home:    Productive cough:     Wheezing:         Neurologic    Sudden weakness in arms or legs:     Sudden numbness in arms or legs:     Sudden onset of difficulty speaking or slurred speech:    Temporary loss of vision in one eye:     Problems with dizziness:         Gastrointestinal    Blood in stool:     Vomited blood:         Genitourinary    Burning when urinating:     Blood in urine:        Psychiatric    Major depression:         Hematologic    Bleeding problems:    Problems with blood clotting  too easily:        Skin    Rashes or ulcers:        Constitutional    Fever or  chills:     PHYSICAL EXAM:   Vitals:   10/10/20 0925  BP: 130/75  Pulse: 72  Resp: 20  Temp: 97.7 F (36.5 C)  SpO2: 98%  Weight: 54.3 kg  Height: '5\' 3"'$  (1.6 m)    GENERAL: The patient is a well-nourished female, in no acute distress. The vital signs are documented above. CARDIAC: There is a regular rate and rhythm.  VASCULAR: She has a good thrill in her right upper arm graft although the graft is slightly pulsatile along the arterial aspect of the graft. She has a palpable right radial pulse. PULMONARY: There is good air exchange bilaterally without wheezing or rales. ABDOMEN: Soft and non-tender with normal pitched bowel sounds.  MUSCULOSKELETAL: There are no major deformities or cyanosis. NEUROLOGIC: No focal weakness or paresthesias are detected. SKIN: There are no ulcers or rashes noted. PSYCHIATRIC: The patient has a normal affect.  DATA:    LABS: Her potassium on 08/01/2020 was 5.6.

## 2020-10-11 ENCOUNTER — Ambulatory Visit (INDEPENDENT_AMBULATORY_CARE_PROVIDER_SITE_OTHER): Payer: Medicare HMO

## 2020-10-11 DIAGNOSIS — J90 Pleural effusion, not elsewhere classified: Secondary | ICD-10-CM | POA: Diagnosis not present

## 2020-10-11 DIAGNOSIS — N186 End stage renal disease: Secondary | ICD-10-CM | POA: Diagnosis not present

## 2020-10-11 DIAGNOSIS — Z5189 Encounter for other specified aftercare: Secondary | ICD-10-CM

## 2020-10-11 DIAGNOSIS — Z992 Dependence on renal dialysis: Secondary | ICD-10-CM | POA: Diagnosis not present

## 2020-10-11 DIAGNOSIS — D631 Anemia in chronic kidney disease: Secondary | ICD-10-CM | POA: Diagnosis not present

## 2020-10-11 DIAGNOSIS — Z719 Counseling, unspecified: Secondary | ICD-10-CM | POA: Diagnosis not present

## 2020-10-11 DIAGNOSIS — D509 Iron deficiency anemia, unspecified: Secondary | ICD-10-CM | POA: Diagnosis not present

## 2020-10-11 DIAGNOSIS — D689 Coagulation defect, unspecified: Secondary | ICD-10-CM | POA: Diagnosis not present

## 2020-10-11 DIAGNOSIS — N2581 Secondary hyperparathyroidism of renal origin: Secondary | ICD-10-CM | POA: Diagnosis not present

## 2020-10-11 NOTE — Progress Notes (Signed)
Patient arrived forrightPleurX catheter drainage. She is s/p PleurX catheter insertion with Dr. Roxan Hockey 07/04/20. Drained in the office today,yielded150 ml's ofpleuralfluid. Patient tolerated well.New dressing in place.Patient will come back to the office againMonday,to be drained.If she becomes short of breath in the meantime, she is to contact the office for additionalinstructions.

## 2020-10-13 DIAGNOSIS — N2581 Secondary hyperparathyroidism of renal origin: Secondary | ICD-10-CM | POA: Diagnosis not present

## 2020-10-13 DIAGNOSIS — D689 Coagulation defect, unspecified: Secondary | ICD-10-CM | POA: Diagnosis not present

## 2020-10-13 DIAGNOSIS — D631 Anemia in chronic kidney disease: Secondary | ICD-10-CM | POA: Diagnosis not present

## 2020-10-13 DIAGNOSIS — Z992 Dependence on renal dialysis: Secondary | ICD-10-CM | POA: Diagnosis not present

## 2020-10-13 DIAGNOSIS — N186 End stage renal disease: Secondary | ICD-10-CM | POA: Diagnosis not present

## 2020-10-13 DIAGNOSIS — D509 Iron deficiency anemia, unspecified: Secondary | ICD-10-CM | POA: Diagnosis not present

## 2020-10-15 ENCOUNTER — Ambulatory Visit (INDEPENDENT_AMBULATORY_CARE_PROVIDER_SITE_OTHER): Payer: Medicare HMO | Admitting: *Deleted

## 2020-10-15 ENCOUNTER — Other Ambulatory Visit: Payer: Self-pay

## 2020-10-15 DIAGNOSIS — Z5189 Encounter for other specified aftercare: Secondary | ICD-10-CM

## 2020-10-15 DIAGNOSIS — Z719 Counseling, unspecified: Secondary | ICD-10-CM

## 2020-10-15 DIAGNOSIS — J90 Pleural effusion, not elsewhere classified: Secondary | ICD-10-CM | POA: Diagnosis not present

## 2020-10-15 NOTE — Progress Notes (Signed)
Patient arrived today for drainage of pleurx catheter. 264m of yellow fluid drained from right chest catheter. No c/o pain or SOB. Patient to return Thursday for next drainage.

## 2020-10-16 DIAGNOSIS — N186 End stage renal disease: Secondary | ICD-10-CM | POA: Diagnosis not present

## 2020-10-16 DIAGNOSIS — Z992 Dependence on renal dialysis: Secondary | ICD-10-CM | POA: Diagnosis not present

## 2020-10-16 DIAGNOSIS — D689 Coagulation defect, unspecified: Secondary | ICD-10-CM | POA: Diagnosis not present

## 2020-10-16 DIAGNOSIS — N2581 Secondary hyperparathyroidism of renal origin: Secondary | ICD-10-CM | POA: Diagnosis not present

## 2020-10-16 DIAGNOSIS — D509 Iron deficiency anemia, unspecified: Secondary | ICD-10-CM | POA: Diagnosis not present

## 2020-10-17 DIAGNOSIS — E1022 Type 1 diabetes mellitus with diabetic chronic kidney disease: Secondary | ICD-10-CM | POA: Diagnosis not present

## 2020-10-18 ENCOUNTER — Other Ambulatory Visit: Payer: Self-pay

## 2020-10-18 ENCOUNTER — Ambulatory Visit (INDEPENDENT_AMBULATORY_CARE_PROVIDER_SITE_OTHER): Payer: Medicare HMO

## 2020-10-18 DIAGNOSIS — E1029 Type 1 diabetes mellitus with other diabetic kidney complication: Secondary | ICD-10-CM | POA: Diagnosis not present

## 2020-10-18 DIAGNOSIS — D509 Iron deficiency anemia, unspecified: Secondary | ICD-10-CM | POA: Diagnosis not present

## 2020-10-18 DIAGNOSIS — D689 Coagulation defect, unspecified: Secondary | ICD-10-CM | POA: Diagnosis not present

## 2020-10-18 DIAGNOSIS — N2581 Secondary hyperparathyroidism of renal origin: Secondary | ICD-10-CM | POA: Diagnosis not present

## 2020-10-18 DIAGNOSIS — N186 End stage renal disease: Secondary | ICD-10-CM | POA: Diagnosis not present

## 2020-10-18 DIAGNOSIS — Z992 Dependence on renal dialysis: Secondary | ICD-10-CM | POA: Diagnosis not present

## 2020-10-18 DIAGNOSIS — J90 Pleural effusion, not elsewhere classified: Secondary | ICD-10-CM

## 2020-10-18 DIAGNOSIS — Z4802 Encounter for removal of sutures: Secondary | ICD-10-CM

## 2020-10-18 NOTE — Progress Notes (Signed)
Drained right pleurX cath today. The amount was 100 cc's of yellow pleural fluid. No signs of infection and patient tolerated well. She did request that [facility] would pick up her home oxygen. She has not been using this for awhile now A fax was sent over to [facility] to pick up the tanks.

## 2020-10-20 DIAGNOSIS — N2581 Secondary hyperparathyroidism of renal origin: Secondary | ICD-10-CM | POA: Diagnosis not present

## 2020-10-20 DIAGNOSIS — Z992 Dependence on renal dialysis: Secondary | ICD-10-CM | POA: Diagnosis not present

## 2020-10-20 DIAGNOSIS — N186 End stage renal disease: Secondary | ICD-10-CM | POA: Diagnosis not present

## 2020-10-20 DIAGNOSIS — D689 Coagulation defect, unspecified: Secondary | ICD-10-CM | POA: Diagnosis not present

## 2020-10-22 ENCOUNTER — Ambulatory Visit (INDEPENDENT_AMBULATORY_CARE_PROVIDER_SITE_OTHER): Payer: Medicare HMO | Admitting: *Deleted

## 2020-10-22 ENCOUNTER — Other Ambulatory Visit: Payer: Self-pay

## 2020-10-22 DIAGNOSIS — I10 Essential (primary) hypertension: Secondary | ICD-10-CM | POA: Diagnosis not present

## 2020-10-22 DIAGNOSIS — I509 Heart failure, unspecified: Secondary | ICD-10-CM | POA: Diagnosis not present

## 2020-10-22 DIAGNOSIS — J918 Pleural effusion in other conditions classified elsewhere: Secondary | ICD-10-CM | POA: Diagnosis not present

## 2020-10-22 DIAGNOSIS — J90 Pleural effusion, not elsewhere classified: Secondary | ICD-10-CM | POA: Diagnosis not present

## 2020-10-22 DIAGNOSIS — Z719 Counseling, unspecified: Secondary | ICD-10-CM

## 2020-10-22 DIAGNOSIS — E119 Type 2 diabetes mellitus without complications: Secondary | ICD-10-CM | POA: Diagnosis not present

## 2020-10-22 DIAGNOSIS — Z5189 Encounter for other specified aftercare: Secondary | ICD-10-CM

## 2020-10-22 NOTE — Progress Notes (Signed)
Ms. Edgeworth arrived for drainage of right Pleurx catheter. 120m of yellow fluid drained from catheter. Pt denies SOB or pain. Patient to return Thursday for next draining.

## 2020-10-23 DIAGNOSIS — N186 End stage renal disease: Secondary | ICD-10-CM | POA: Diagnosis not present

## 2020-10-23 DIAGNOSIS — Z992 Dependence on renal dialysis: Secondary | ICD-10-CM | POA: Diagnosis not present

## 2020-10-23 DIAGNOSIS — D689 Coagulation defect, unspecified: Secondary | ICD-10-CM | POA: Diagnosis not present

## 2020-10-23 DIAGNOSIS — N2581 Secondary hyperparathyroidism of renal origin: Secondary | ICD-10-CM | POA: Diagnosis not present

## 2020-10-25 ENCOUNTER — Ambulatory Visit (INDEPENDENT_AMBULATORY_CARE_PROVIDER_SITE_OTHER): Payer: Medicare HMO

## 2020-10-25 ENCOUNTER — Other Ambulatory Visit: Payer: Self-pay

## 2020-10-25 DIAGNOSIS — J90 Pleural effusion, not elsewhere classified: Secondary | ICD-10-CM | POA: Diagnosis not present

## 2020-10-25 DIAGNOSIS — D689 Coagulation defect, unspecified: Secondary | ICD-10-CM | POA: Diagnosis not present

## 2020-10-25 DIAGNOSIS — Z992 Dependence on renal dialysis: Secondary | ICD-10-CM | POA: Diagnosis not present

## 2020-10-25 DIAGNOSIS — N2581 Secondary hyperparathyroidism of renal origin: Secondary | ICD-10-CM | POA: Diagnosis not present

## 2020-10-25 DIAGNOSIS — N186 End stage renal disease: Secondary | ICD-10-CM | POA: Diagnosis not present

## 2020-10-25 DIAGNOSIS — Z719 Counseling, unspecified: Secondary | ICD-10-CM

## 2020-10-25 NOTE — Progress Notes (Signed)
Drained right pleurX Cath today, the amount was 250 cc's of light yellow pleural fluid, No signs of infection and patient tolerated well. She is scheduled to see Dr Roxan Hockey next week with a CXR.

## 2020-10-27 DIAGNOSIS — Z992 Dependence on renal dialysis: Secondary | ICD-10-CM | POA: Diagnosis not present

## 2020-10-27 DIAGNOSIS — N186 End stage renal disease: Secondary | ICD-10-CM | POA: Diagnosis not present

## 2020-10-27 DIAGNOSIS — N2581 Secondary hyperparathyroidism of renal origin: Secondary | ICD-10-CM | POA: Diagnosis not present

## 2020-10-27 DIAGNOSIS — D689 Coagulation defect, unspecified: Secondary | ICD-10-CM | POA: Diagnosis not present

## 2020-10-29 ENCOUNTER — Other Ambulatory Visit: Payer: Self-pay

## 2020-10-29 ENCOUNTER — Other Ambulatory Visit (HOSPITAL_COMMUNITY)
Admission: RE | Admit: 2020-10-29 | Discharge: 2020-10-29 | Disposition: A | Discharge status: Home / Self Care | Payer: Medicare HMO | Source: Ambulatory Visit | Attending: Vascular Surgery | Admitting: Vascular Surgery

## 2020-10-29 ENCOUNTER — Telehealth: Payer: Self-pay

## 2020-10-29 ENCOUNTER — Ambulatory Visit (INDEPENDENT_AMBULATORY_CARE_PROVIDER_SITE_OTHER): Payer: Medicare HMO | Admitting: *Deleted

## 2020-10-29 DIAGNOSIS — J90 Pleural effusion, not elsewhere classified: Secondary | ICD-10-CM | POA: Diagnosis not present

## 2020-10-29 DIAGNOSIS — Z01812 Encounter for preprocedural laboratory examination: Secondary | ICD-10-CM | POA: Diagnosis not present

## 2020-10-29 DIAGNOSIS — Z719 Counseling, unspecified: Secondary | ICD-10-CM

## 2020-10-29 DIAGNOSIS — Z20822 Contact with and (suspected) exposure to covid-19: Secondary | ICD-10-CM | POA: Insufficient documentation

## 2020-10-29 DIAGNOSIS — Z5189 Encounter for other specified aftercare: Secondary | ICD-10-CM

## 2020-10-29 LAB — SARS CORONAVIRUS 2 (TAT 6-24 HRS): SARS Coronavirus 2: NEGATIVE

## 2020-10-29 NOTE — Progress Notes (Signed)
Ms. Mcgaugh arrived for drainage of right pleural drainage catheter. 73m of light yellow fluid drained from right catheter. Patient tolerated drainage without SOB or pain. Patient to follow up with Dr. HRoxan Hockeywith CXR Thursday.

## 2020-10-29 NOTE — Telephone Encounter (Signed)
Pt called with multiple questions regarding her upcoming fistulogram procedure on 11/01/19 d/t she can't find her letter. Reviewed pts arrival time/time of procedure, npo after MN, how to adjust insulin night before and morning of procedure. Pt verbalized understanding. Pt showing already completed covid test on today.

## 2020-10-30 ENCOUNTER — Other Ambulatory Visit: Payer: Self-pay | Admitting: Thoracic Surgery (Cardiothoracic Vascular Surgery)

## 2020-10-30 DIAGNOSIS — J90 Pleural effusion, not elsewhere classified: Secondary | ICD-10-CM

## 2020-10-30 DIAGNOSIS — D689 Coagulation defect, unspecified: Secondary | ICD-10-CM | POA: Diagnosis not present

## 2020-10-30 DIAGNOSIS — Z992 Dependence on renal dialysis: Secondary | ICD-10-CM | POA: Diagnosis not present

## 2020-10-30 DIAGNOSIS — N2581 Secondary hyperparathyroidism of renal origin: Secondary | ICD-10-CM | POA: Diagnosis not present

## 2020-10-30 DIAGNOSIS — N186 End stage renal disease: Secondary | ICD-10-CM | POA: Diagnosis not present

## 2020-10-31 ENCOUNTER — Encounter (HOSPITAL_COMMUNITY)
Admission: RE | Disposition: A | Discharge status: Home / Self Care | Payer: Self-pay | Source: Home / Self Care | Attending: Vascular Surgery

## 2020-10-31 ENCOUNTER — Ambulatory Visit (HOSPITAL_COMMUNITY)
Admission: RE | Admit: 2020-10-31 | Discharge: 2020-10-31 | Disposition: A | Discharge status: Home / Self Care | Payer: Medicare HMO | Attending: Vascular Surgery | Admitting: Vascular Surgery

## 2020-10-31 ENCOUNTER — Ambulatory Visit
Admission: RE | Admit: 2020-10-31 | Discharge: 2020-10-31 | Disposition: A | Discharge status: Home / Self Care | Payer: Medicare HMO | Source: Ambulatory Visit | Attending: Thoracic Surgery (Cardiothoracic Vascular Surgery) | Admitting: Thoracic Surgery (Cardiothoracic Vascular Surgery)

## 2020-10-31 ENCOUNTER — Ambulatory Visit (INDEPENDENT_AMBULATORY_CARE_PROVIDER_SITE_OTHER): Payer: Medicare HMO | Admitting: Thoracic Surgery (Cardiothoracic Vascular Surgery)

## 2020-10-31 ENCOUNTER — Other Ambulatory Visit: Payer: Self-pay

## 2020-10-31 ENCOUNTER — Encounter (HOSPITAL_COMMUNITY): Payer: Self-pay | Admitting: Vascular Surgery

## 2020-10-31 VITALS — BP 153/86 | HR 73 | Resp 20 | Ht 63.0 in | Wt 121.0 lb

## 2020-10-31 DIAGNOSIS — I509 Heart failure, unspecified: Secondary | ICD-10-CM | POA: Diagnosis not present

## 2020-10-31 DIAGNOSIS — N186 End stage renal disease: Secondary | ICD-10-CM | POA: Insufficient documentation

## 2020-10-31 DIAGNOSIS — T82510A Breakdown (mechanical) of surgically created arteriovenous fistula, initial encounter: Secondary | ICD-10-CM | POA: Diagnosis present

## 2020-10-31 DIAGNOSIS — Z79899 Other long term (current) drug therapy: Secondary | ICD-10-CM | POA: Insufficient documentation

## 2020-10-31 DIAGNOSIS — E1022 Type 1 diabetes mellitus with diabetic chronic kidney disease: Secondary | ICD-10-CM | POA: Diagnosis not present

## 2020-10-31 DIAGNOSIS — Z794 Long term (current) use of insulin: Secondary | ICD-10-CM | POA: Diagnosis not present

## 2020-10-31 DIAGNOSIS — J9811 Atelectasis: Secondary | ICD-10-CM | POA: Diagnosis not present

## 2020-10-31 DIAGNOSIS — J90 Pleural effusion, not elsewhere classified: Secondary | ICD-10-CM

## 2020-10-31 DIAGNOSIS — Z9889 Other specified postprocedural states: Secondary | ICD-10-CM | POA: Diagnosis not present

## 2020-10-31 DIAGNOSIS — Z992 Dependence on renal dialysis: Secondary | ICD-10-CM | POA: Diagnosis not present

## 2020-10-31 DIAGNOSIS — Y841 Kidney dialysis as the cause of abnormal reaction of the patient, or of later complication, without mention of misadventure at the time of the procedure: Secondary | ICD-10-CM | POA: Insufficient documentation

## 2020-10-31 DIAGNOSIS — I132 Hypertensive heart and chronic kidney disease with heart failure and with stage 5 chronic kidney disease, or end stage renal disease: Secondary | ICD-10-CM | POA: Insufficient documentation

## 2020-10-31 HISTORY — PX: A/V FISTULAGRAM: CATH118298

## 2020-10-31 LAB — POCT I-STAT, CHEM 8
BUN: 30 mg/dL — ABNORMAL HIGH (ref 6–20)
Calcium, Ion: 0.91 mmol/L — ABNORMAL LOW (ref 1.15–1.40)
Chloride: 100 mmol/L (ref 98–111)
Creatinine, Ser: 6.1 mg/dL — ABNORMAL HIGH (ref 0.44–1.00)
Glucose, Bld: 123 mg/dL — ABNORMAL HIGH (ref 70–99)
HCT: 38 % (ref 36.0–46.0)
Hemoglobin: 12.9 g/dL (ref 12.0–15.0)
Potassium: 6 mmol/L — ABNORMAL HIGH (ref 3.5–5.1)
Sodium: 136 mmol/L (ref 135–145)
TCO2: 29 mmol/L (ref 22–32)

## 2020-10-31 SURGERY — A/V FISTULAGRAM
Anesthesia: LOCAL | Laterality: Right

## 2020-10-31 MED ORDER — SODIUM CHLORIDE 0.9 % IV SOLN: 250.0000 mL | INTRAVENOUS | Status: DC | PRN

## 2020-10-31 MED ORDER — HEPARIN (PORCINE) IN NACL 1000-0.9 UT/500ML-% IV SOLN
INTRAVENOUS | Status: AC
  Filled 2020-10-31: qty 500

## 2020-10-31 MED ORDER — LIDOCAINE HCL (PF) 1 % IJ SOLN
INTRAMUSCULAR | Status: DC | PRN
Start: 2020-10-31 — End: 2020-10-31
  Administered 2020-10-31: 2 mL

## 2020-10-31 MED ORDER — LIDOCAINE HCL (PF) 1 % IJ SOLN
INTRAMUSCULAR | Status: AC
  Filled 2020-10-31: qty 30

## 2020-10-31 MED ORDER — SODIUM CHLORIDE 0.9% FLUSH: 3.0000 mL | Freq: Two times a day (BID) | INTRAVENOUS | Status: DC

## 2020-10-31 MED ORDER — IODIXANOL 320 MG/ML IV SOLN
INTRAVENOUS | Status: DC | PRN
  Administered 2020-10-31: 30 mL via INTRAVENOUS

## 2020-10-31 MED ORDER — HEPARIN (PORCINE) IN NACL 1000-0.9 UT/500ML-% IV SOLN
INTRAVENOUS | Status: DC | PRN
  Administered 2020-10-31: 500 mL

## 2020-10-31 MED ORDER — SODIUM CHLORIDE 0.9% FLUSH: 3.0000 mL | INTRAVENOUS | Status: DC | PRN

## 2020-10-31 SURGICAL SUPPLY — 9 items
BAG SNAP BAND KOVER 36X36 (MISCELLANEOUS) ×2 IMPLANT
COVER DOME SNAP 22 D (MISCELLANEOUS) ×2 IMPLANT
KIT MICROPUNCTURE NIT STIFF (SHEATH) ×2 IMPLANT
PROTECTION STATION PRESSURIZED (MISCELLANEOUS) ×2
SHEATH PROBE COVER 6X72 (BAG) ×4 IMPLANT
STATION PROTECTION PRESSURIZED (MISCELLANEOUS) ×1 IMPLANT
STOPCOCK MORSE 400PSI 3WAY (MISCELLANEOUS) ×2 IMPLANT
TRAY PV CATH (CUSTOM PROCEDURE TRAY) ×2 IMPLANT
TUBING CIL FLEX 10 FLL-RA (TUBING) ×2 IMPLANT

## 2020-10-31 NOTE — Interval H&P Note (Signed)
History and Physical Interval Note:  10/31/2020 7:49 AM  [name]  has presented today for surgery, with the diagnosis of instage renal.  The various methods of treatment have been discussed with the patient and family. After consideration of risks, benefits and other options for treatment, the patient has consented to  Procedure(s): A/V FISTULAGRAM (Right) as a surgical intervention.  The patient's history has been reviewed, patient examined, no change in status, stable for surgery.  I have reviewed the patient's chart and labs.  Questions were answered to the patient's satisfaction.     Cherre Robins

## 2020-10-31 NOTE — Op Note (Signed)
DATE OF SERVICE: 10/31/2020  PATIENT:  [name]  45 y.o. female  PRE-OPERATIVE DIAGNOSIS:  ESRD on HD, CHF  POST-OPERATIVE DIAGNOSIS:  Same  PROCEDURE:   Right upper extremity fistulagram  SURGEON:  Surgeon(s) and Role:    * Cherre Robins, MD - Primary  ASSISTANT: none  ANESTHESIA:   local  EBL: min  BLOOD ADMINISTERED:none  DRAINS: none   LOCAL MEDICATIONS USED:  LIDOCAINE   SPECIMEN:  none  COUNTS: confirmed correct.  TOURNIQUET:  None  PATIENT DISPOSITION:  PACU - hemodynamically stable.   Delay start of Pharmacological VTE agent (>24hrs) due to surgical blood loss or risk of bleeding: no  INDICATION FOR PROCEDURE: [name] is a 45 y.o. female with ESRD dialyzing via RUE AV loop graft. She has congestive heart failure. Her nephrologist is worried that the graft is exacerbating her heart failure. After careful discussion of risks, benefits, and alternatives the patient was offered fistulagram. The patient understood and wished to proceed.  OPERATIVE FINDINGS: mild stenosis in graft outflow near the axilla. This does not appear to be flow limiting. Mild collateralization about the shoulder. No flow limiting stenosis in axillary vein, subclavian vein, innominate vein, or vena cava.  DESCRIPTION OF PROCEDURE: After identification of the patient in the pre-operative holding area, the patient was transferred to the operating room. The patient was positioned supine on the operating room table. Anesthesia was induced. The right arm was prepped and draped in standard fashion. A surgical pause was performed confirming correct patient, procedure, and operative location.  Using micropuncture technique, the graft was accessed retrograde.  Fistulogram was performed in several stations to evaluate the flow through the graft.  No flow-limiting stenosis was identified.  See above for details.  The sheath was withdrawn and a figure-of-eight stitch placed around the  access point.  Hemostasis was achieved.  A sterile dressing was applied.  Upon completion of the case instrument and sharps counts were confirmed correct. The patient was transferred to the PACU in good condition. I was present for all portions of the procedure.  Yevonne Aline. Stanford Breed, MD [facility] Phone Number: [phone] 10/31/2020 8:49 AM

## 2020-10-31 NOTE — Progress Notes (Signed)
[address]       [place] [address]             [phone]     HPI: Ms. Mouch returns for follow-up of her right pleural effusion.  Nyalah Hann is a 45 year old woman with a history of renal failure on hemodialysis, type 1 diabetes, diabetic retinopathy, diabetic neuropathy, right heart failure, tricuspid regurgitation, hypertension, hyperlipidemia, ascites, and recurrent right pleural effusions.  She had multiple thoracenteses and finally placed a pleural catheter on 07/04/2020 for palliative management of her effusion.  She has been draining twice weekly since then.  I last saw her in March.  At that time she was draining about 450 mL each time.  She had a fistulogram today to assess whether or not banding might be appropriate to help limit the flow through her AV fistula.  She has been feeling reasonably well.  Her shortness of breath is improved.  She still has a lot of ascites.  She still draining twice a week and is draining about 250 mL or less each time.  Past Medical History:  Diagnosis Date  . Anemia   . Arthritis   . Ascites 03/28/2020  . CHF (congestive heart failure) ([place])   . Diabetes mellitus   . Diabetic retinopathy ([place])    Hx of laser Rx's  . DM type 1 (diabetes mellitus, type 1) ([place]) 09/12/2014   Age of onset for DM type 1 was age 84.     . Enlarged thyroid gland   . ESRD on hemodialysis ([place])    ESRD due to DM type I, age of onset DM 1 was age 12.  Went on dialysis in March 2012.  Gets HD now at [facility] on a TTS schedule.    Marland Kitchen ESRD on hemodialysis ([place]) 09/12/2014   ESRD due to DM type 1.  Started HD in 2012 at [facility].  Gets HD there on a TTS schedule.    Marland Kitchen GERD (gastroesophageal reflux disease)   . History of blood transfusion   . Hyperlipidemia   . Hypertension   . Peripheral vascular disease ([place])   . Pneumonia     Current Outpatient Medications  Medication Sig Dispense Refill  . amLODipine (NORVASC) 10 MG tablet  Take 10 mg by mouth at bedtime.    . calcium acetate (PHOSLO) 667 MG capsule Take 667 mg by mouth See admin instructions. Take 1 capsule (667 mg) by mouth with each meal & each snack    . GLUCAGON EMERGENCY 1 MG injection Inject 1 mg into the muscle once as needed (for severe low blood sugar).     . Insulin Human (INSULIN PUMP) SOLN Inject into the skin continuous. Novolog    . labetalol (NORMODYNE) 300 MG tablet Take 300 mg by mouth 2 (two) times daily.    Marland Kitchen losartan (COZAAR) 50 MG tablet Take 50 mg by mouth at bedtime.    Marland Kitchen NOVOLOG 100 UNIT/ML injection Inject into the skin See admin instructions. Via Insulin pump    . rosuvastatin (CRESTOR) 20 MG tablet Take 20 mg by mouth at bedtime.     No current facility-administered medications for this visit.    Physical Exam BP (!) 153/86 (BP Location: Left Wrist, Patient Position: Sitting)   Pulse 73   Resp 20   Ht '5\' 3"'$  (1.6 m)   Wt 121 lb (54.9 kg)   LMP 12/21/2017   SpO2 93% Comment: RA  BMI 21.43  kg/m  45 year old woman in no acute distress Alert and oriented x3 with no focal deficits Cardiac regular rate and rhythm with a systolic murmur Lungs clear with essentially equal breath sounds bilaterally Catheter site clean and dry Ascites  Diagnostic Tests: CHEST - 2 VIEW  COMPARISON:  September 25, 2020  FINDINGS: PleurX catheter again noted on the right. Small partially loculated right pleural effusion evident with right base atelectasis. Lungs elsewhere are clear. Heart is enlarged with pulmonary vascularity normal. No adenopathy. No bone lesions. Postoperative changes noted in the left upper arm.  IMPRESSION: PleurX catheter evident on the right with small partially loculated right pleural effusion and right base atelectasis. Lungs elsewhere clear. Cardiac prominence noted. No adenopathy appreciable.   Electronically Signed   By: Lowella Grip III M.D.   On: 10/31/2020 16:10  I personally reviewed the chest x-ray  images.  Shows much improved aeration at the right lung base with a small effusion compared to her film from last month.  Impression: Marquelle Amon is a 45 year old woman with a history of renal failure on hemodialysis, type 1 diabetes, diabetic retinopathy, diabetic neuropathy, right heart failure, tricuspid regurgitation, hypertension, hyperlipidemia, ascites, and recurrent right pleural effusions.  She had multiple thoracenteses and finally placed a pleural catheter on 07/04/2020 for palliative management of her effusion.  She has been draining twice weekly since then.  Right pleural effusion-output from the pleural catheter has decreased.  The residual effusion has decreased and is much better aeration of the right lower lobe on her current chest x-ray.  Hopefully she is starting to achieve some pleural synthesis and eventually will be will be able to remove the catheter.  Right heart failure-improved symptomatically but still has significant ascites. She had a fistulogram today to assess whether or not banding may be helpful in controlling her volume overload.  Plan: Continue twice weekly drainage Return in 1 month with PA lateral chest x-ray  Melrose Nakayama, MD [facility] [phone]

## 2020-10-31 NOTE — Discharge Instructions (Signed)
Dialysis Fistulogram, Care After The following information offers guidance on how to care for yourself after your procedure. Your health care provider may also give you more specific instructions. If you have problems or questions, contact your health care provider. What can I expect after the procedure? After the procedure, it is common to have:  A small amount of discomfort in the area where the small tube (catheter) was placed for the procedure.  A small amount of bruising around the fistula.  Sleepiness and tiredness (fatigue). Follow these instructions at home: Puncture site care  Follow instructions from your health care provider about how to take care of the site where catheters were inserted. Make sure you: ? Wash your hands with soap and water for at least 20 seconds before and after you change your bandage (dressing). If soap and water are not available, use hand sanitizer. ? Change your dressing as told by your health care provider. ? Leave stitches (sutures), skin glue, or adhesive strips in place. These skin closures may need to stay in place for 2 weeks or longer. If adhesive strip edges start to loosen and curl up, you may trim the loose edges. Do not remove adhesive strips completely unless your health care provider tells you to do that.  Check your puncture area every day for signs of infection. Check for: ? More redness, swelling, or pain. ? Fluid or blood. ? Warmth. ? Pus or a bad smell.   Activity  Rest as much as you can.  If you were given a sedative during the procedure, it can affect you for several hours. Do not drive or operate machinery until your health care provider says that it is safe.  Do not lift anything that is heavier than 5 lb (2.3 kg), or the limit that you are told, on the day of your procedure.  Do not do anything strenuous with your arm for the rest of the day. Avoid household activities, such as vacuuming.  Return to your normal activities as  told by your health care provider. Ask your health care provider what activities are safe for you. Safety To prevent damage to your graft or fistula:  Do not wear tight-fitting clothing or jewelry on the arm or leg that has your graft or fistula.  Tell all your health care providers that you have a dialysis fistula or graft.  Do not allow blood draws, IVs, or blood pressure readings to be done in the arm that has your fistula or graft.  Do not allow flu shots or vaccinations in the arm with your fistula or graft. General instructions  Take over-the-counter and prescription medicines only as told by your health care provider.  Do not take baths, swim, or use a hot tub until your health care provider approves. Ask your health care provider if you may take showers. You may only be allowed to take sponge baths.  Monitor your dialysis fistula closely. Check to make sure that you can feel a vibration or buzz (a thrill) when you put your fingers over the fistula.  Keep all follow-up visits. This is important. Contact a health care provider if:  You have more redness, swelling, or pain at the site where the catheter was put in.  You have fluid or blood coming from the catheter site.  You have pus or a bad smell coming from the catheter site.  Your catheter site feels warm.  You have a fever or chills. Get help right away if:    You have bleeding from the vascular access site that does not stop.  You feel weak.  You have trouble balancing.  You have trouble moving your arms or legs.  You have problems with your speech or vision.  You can no longer feel a vibration or buzz when you put your fingers over your fistula.  The limb that was used for the procedure swells or becomes painful, cold, blue, or pale white.  You have chest pain or shortness of breath. These symptoms may represent a serious problem that is an emergency. Do not wait to see if the symptoms will go away. Get  medical help right away. Call your local emergency services (911 in the [place] Do not drive yourself to the hospital. Summary  After a dialysis fistulogram, it is common to have a small amount of discomfort or bruising in the area where the small, thin tube (catheter) was placed.  Rest as much as you can after your procedure. Return to your normal activities as told by your health care provider.  Take over-the-counter and prescription medicines only as told by your health care provider.  Follow instructions from your health care provider about how to take care of the site where the catheter was inserted.  Keep all follow-up visits. This is important. This information is not intended to replace advice given to you by your health care provider. Make sure you discuss any questions you have with your health care provider. Document Revised: 02/15/2020 Document Reviewed: 02/15/2020 Elsevier Patient Education  2021 Elsevier Inc.  

## 2020-11-01 ENCOUNTER — Ambulatory Visit: Payer: Medicare HMO | Admitting: Thoracic Surgery (Cardiothoracic Vascular Surgery)

## 2020-11-01 DIAGNOSIS — Z992 Dependence on renal dialysis: Secondary | ICD-10-CM | POA: Diagnosis not present

## 2020-11-01 DIAGNOSIS — N186 End stage renal disease: Secondary | ICD-10-CM | POA: Diagnosis not present

## 2020-11-01 DIAGNOSIS — N2581 Secondary hyperparathyroidism of renal origin: Secondary | ICD-10-CM | POA: Diagnosis not present

## 2020-11-01 DIAGNOSIS — D689 Coagulation defect, unspecified: Secondary | ICD-10-CM | POA: Diagnosis not present

## 2020-11-01 LAB — POCT I-STAT, CHEM 8
BUN: 44 mg/dL — ABNORMAL HIGH (ref 6–20)
Calcium, Ion: 0.96 mmol/L — ABNORMAL LOW (ref 1.15–1.40)
Chloride: 98 mmol/L (ref 98–111)
Creatinine, Ser: 6.3 mg/dL — ABNORMAL HIGH (ref 0.44–1.00)
Glucose, Bld: 126 mg/dL — ABNORMAL HIGH (ref 70–99)
HCT: 41 % (ref 36.0–46.0)
Hemoglobin: 13.9 g/dL (ref 12.0–15.0)
Potassium: 7 mmol/L (ref 3.5–5.1)
Sodium: 137 mmol/L (ref 135–145)
TCO2: 32 mmol/L (ref 22–32)

## 2020-11-03 DIAGNOSIS — N2581 Secondary hyperparathyroidism of renal origin: Secondary | ICD-10-CM | POA: Diagnosis not present

## 2020-11-03 DIAGNOSIS — N186 End stage renal disease: Secondary | ICD-10-CM | POA: Diagnosis not present

## 2020-11-03 DIAGNOSIS — Z992 Dependence on renal dialysis: Secondary | ICD-10-CM | POA: Diagnosis not present

## 2020-11-03 DIAGNOSIS — D689 Coagulation defect, unspecified: Secondary | ICD-10-CM | POA: Diagnosis not present

## 2020-11-07 DIAGNOSIS — E785 Hyperlipidemia, unspecified: Secondary | ICD-10-CM | POA: Diagnosis not present

## 2020-11-07 DIAGNOSIS — J9601 Acute respiratory failure with hypoxia: Secondary | ICD-10-CM | POA: Diagnosis not present

## 2020-11-07 DIAGNOSIS — I120 Hypertensive chronic kidney disease with stage 5 chronic kidney disease or end stage renal disease: Secondary | ICD-10-CM | POA: Diagnosis not present

## 2020-11-07 DIAGNOSIS — E10649 Type 1 diabetes mellitus with hypoglycemia without coma: Secondary | ICD-10-CM | POA: Diagnosis not present

## 2020-11-07 DIAGNOSIS — E1110 Type 2 diabetes mellitus with ketoacidosis without coma: Secondary | ICD-10-CM | POA: Diagnosis not present

## 2020-11-07 DIAGNOSIS — E1065 Type 1 diabetes mellitus with hyperglycemia: Secondary | ICD-10-CM | POA: Diagnosis not present

## 2020-11-07 DIAGNOSIS — Z794 Long term (current) use of insulin: Secondary | ICD-10-CM | POA: Diagnosis not present

## 2020-11-07 DIAGNOSIS — Z4682 Encounter for fitting and adjustment of non-vascular catheter: Secondary | ICD-10-CM | POA: Diagnosis not present

## 2020-11-07 DIAGNOSIS — E1010 Type 1 diabetes mellitus with ketoacidosis without coma: Secondary | ICD-10-CM | POA: Diagnosis not present

## 2020-11-07 DIAGNOSIS — E875 Hyperkalemia: Secondary | ICD-10-CM | POA: Diagnosis not present

## 2020-11-07 DIAGNOSIS — N186 End stage renal disease: Secondary | ICD-10-CM | POA: Diagnosis not present

## 2020-11-07 DIAGNOSIS — E1122 Type 2 diabetes mellitus with diabetic chronic kidney disease: Secondary | ICD-10-CM | POA: Diagnosis not present

## 2020-11-07 DIAGNOSIS — J9811 Atelectasis: Secondary | ICD-10-CM | POA: Diagnosis not present

## 2020-11-07 DIAGNOSIS — E1022 Type 1 diabetes mellitus with diabetic chronic kidney disease: Secondary | ICD-10-CM | POA: Diagnosis not present

## 2020-11-07 DIAGNOSIS — J90 Pleural effusion, not elsewhere classified: Secondary | ICD-10-CM | POA: Diagnosis not present

## 2020-11-07 DIAGNOSIS — R0902 Hypoxemia: Secondary | ICD-10-CM | POA: Diagnosis not present

## 2020-11-07 DIAGNOSIS — E1165 Type 2 diabetes mellitus with hyperglycemia: Secondary | ICD-10-CM | POA: Diagnosis not present

## 2020-11-07 DIAGNOSIS — E162 Hypoglycemia, unspecified: Secondary | ICD-10-CM | POA: Diagnosis not present

## 2020-11-07 DIAGNOSIS — Z992 Dependence on renal dialysis: Secondary | ICD-10-CM | POA: Diagnosis not present

## 2020-11-07 DIAGNOSIS — E878 Other disorders of electrolyte and fluid balance, not elsewhere classified: Secondary | ICD-10-CM | POA: Diagnosis not present

## 2020-11-07 DIAGNOSIS — Z20822 Contact with and (suspected) exposure to covid-19: Secondary | ICD-10-CM | POA: Diagnosis not present

## 2020-11-07 DIAGNOSIS — D631 Anemia in chronic kidney disease: Secondary | ICD-10-CM | POA: Diagnosis not present

## 2020-11-08 ENCOUNTER — Ambulatory Visit: Payer: Medicare HMO

## 2020-11-08 DIAGNOSIS — E878 Other disorders of electrolyte and fluid balance, not elsewhere classified: Secondary | ICD-10-CM | POA: Diagnosis not present

## 2020-11-08 DIAGNOSIS — E875 Hyperkalemia: Secondary | ICD-10-CM | POA: Diagnosis not present

## 2020-11-08 DIAGNOSIS — E10649 Type 1 diabetes mellitus with hypoglycemia without coma: Secondary | ICD-10-CM | POA: Diagnosis not present

## 2020-11-08 DIAGNOSIS — Z4682 Encounter for fitting and adjustment of non-vascular catheter: Secondary | ICD-10-CM | POA: Diagnosis not present

## 2020-11-08 DIAGNOSIS — E1010 Type 1 diabetes mellitus with ketoacidosis without coma: Secondary | ICD-10-CM | POA: Diagnosis not present

## 2020-11-08 DIAGNOSIS — N186 End stage renal disease: Secondary | ICD-10-CM | POA: Diagnosis not present

## 2020-11-08 DIAGNOSIS — E162 Hypoglycemia, unspecified: Secondary | ICD-10-CM | POA: Diagnosis not present

## 2020-11-08 DIAGNOSIS — J9811 Atelectasis: Secondary | ICD-10-CM | POA: Diagnosis not present

## 2020-11-08 DIAGNOSIS — E1065 Type 1 diabetes mellitus with hyperglycemia: Secondary | ICD-10-CM | POA: Diagnosis not present

## 2020-11-08 DIAGNOSIS — R739 Hyperglycemia, unspecified: Secondary | ICD-10-CM | POA: Diagnosis not present

## 2020-11-08 DIAGNOSIS — J90 Pleural effusion, not elsewhere classified: Secondary | ICD-10-CM | POA: Diagnosis not present

## 2020-11-08 DIAGNOSIS — Z794 Long term (current) use of insulin: Secondary | ICD-10-CM | POA: Diagnosis not present

## 2020-11-08 DIAGNOSIS — E1110 Type 2 diabetes mellitus with ketoacidosis without coma: Secondary | ICD-10-CM | POA: Diagnosis not present

## 2020-11-08 DIAGNOSIS — R0902 Hypoxemia: Secondary | ICD-10-CM | POA: Diagnosis not present

## 2020-11-09 DIAGNOSIS — E162 Hypoglycemia, unspecified: Secondary | ICD-10-CM | POA: Diagnosis not present

## 2020-11-09 DIAGNOSIS — E1010 Type 1 diabetes mellitus with ketoacidosis without coma: Secondary | ICD-10-CM | POA: Diagnosis not present

## 2020-11-09 DIAGNOSIS — E10649 Type 1 diabetes mellitus with hypoglycemia without coma: Secondary | ICD-10-CM | POA: Diagnosis not present

## 2020-11-09 DIAGNOSIS — E1065 Type 1 diabetes mellitus with hyperglycemia: Secondary | ICD-10-CM | POA: Diagnosis not present

## 2020-11-09 DIAGNOSIS — Z794 Long term (current) use of insulin: Secondary | ICD-10-CM | POA: Diagnosis not present

## 2020-11-09 DIAGNOSIS — E1110 Type 2 diabetes mellitus with ketoacidosis without coma: Secondary | ICD-10-CM | POA: Diagnosis not present

## 2020-11-09 DIAGNOSIS — R0902 Hypoxemia: Secondary | ICD-10-CM | POA: Diagnosis not present

## 2020-11-10 DIAGNOSIS — Z992 Dependence on renal dialysis: Secondary | ICD-10-CM | POA: Diagnosis not present

## 2020-11-10 DIAGNOSIS — E875 Hyperkalemia: Secondary | ICD-10-CM | POA: Diagnosis not present

## 2020-11-10 DIAGNOSIS — E1010 Type 1 diabetes mellitus with ketoacidosis without coma: Secondary | ICD-10-CM | POA: Diagnosis not present

## 2020-11-10 DIAGNOSIS — E162 Hypoglycemia, unspecified: Secondary | ICD-10-CM | POA: Diagnosis not present

## 2020-11-10 DIAGNOSIS — E10649 Type 1 diabetes mellitus with hypoglycemia without coma: Secondary | ICD-10-CM | POA: Diagnosis not present

## 2020-11-10 DIAGNOSIS — E1022 Type 1 diabetes mellitus with diabetic chronic kidney disease: Secondary | ICD-10-CM | POA: Diagnosis not present

## 2020-11-10 DIAGNOSIS — J9601 Acute respiratory failure with hypoxia: Secondary | ICD-10-CM | POA: Diagnosis not present

## 2020-11-10 DIAGNOSIS — Z794 Long term (current) use of insulin: Secondary | ICD-10-CM | POA: Diagnosis not present

## 2020-11-10 DIAGNOSIS — N186 End stage renal disease: Secondary | ICD-10-CM | POA: Diagnosis not present

## 2020-11-10 DIAGNOSIS — E1065 Type 1 diabetes mellitus with hyperglycemia: Secondary | ICD-10-CM | POA: Diagnosis not present

## 2020-11-13 DIAGNOSIS — E1029 Type 1 diabetes mellitus with other diabetic kidney complication: Secondary | ICD-10-CM | POA: Diagnosis not present

## 2020-11-13 DIAGNOSIS — N2581 Secondary hyperparathyroidism of renal origin: Secondary | ICD-10-CM | POA: Diagnosis not present

## 2020-11-13 DIAGNOSIS — N186 End stage renal disease: Secondary | ICD-10-CM | POA: Diagnosis not present

## 2020-11-13 DIAGNOSIS — D689 Coagulation defect, unspecified: Secondary | ICD-10-CM | POA: Diagnosis not present

## 2020-11-13 DIAGNOSIS — Z992 Dependence on renal dialysis: Secondary | ICD-10-CM | POA: Diagnosis not present

## 2020-11-14 ENCOUNTER — Ambulatory Visit (INDEPENDENT_AMBULATORY_CARE_PROVIDER_SITE_OTHER): Payer: Medicare HMO | Admitting: *Deleted

## 2020-11-14 ENCOUNTER — Other Ambulatory Visit: Payer: Self-pay

## 2020-11-14 ENCOUNTER — Ambulatory Visit (INDEPENDENT_AMBULATORY_CARE_PROVIDER_SITE_OTHER): Payer: Medicare HMO | Admitting: Vascular Surgery

## 2020-11-14 ENCOUNTER — Encounter: Payer: Self-pay | Admitting: Vascular Surgery

## 2020-11-14 VITALS — BP 123/62 | HR 79 | Temp 98.4°F | Resp 20 | Ht 63.0 in | Wt 127.0 lb

## 2020-11-14 DIAGNOSIS — J90 Pleural effusion, not elsewhere classified: Secondary | ICD-10-CM

## 2020-11-14 DIAGNOSIS — Z992 Dependence on renal dialysis: Secondary | ICD-10-CM

## 2020-11-14 DIAGNOSIS — Z5189 Encounter for other specified aftercare: Secondary | ICD-10-CM

## 2020-11-14 DIAGNOSIS — Z719 Counseling, unspecified: Secondary | ICD-10-CM | POA: Diagnosis not present

## 2020-11-14 DIAGNOSIS — N186 End stage renal disease: Secondary | ICD-10-CM | POA: Diagnosis not present

## 2020-11-14 NOTE — Progress Notes (Signed)
Patient name: [name] MRN: [id number] DOB: 1976/01/29 Sex: female  REASON FOR VISIT:   To discuss results of fistulogram.  HPI:   [name] is a pleasant 45 y.o. female who I saw in consultation on 10/10/2020 to consider banding her right arm AV graft.  Patient has a history of congestive heart failure.  I placed a right arm AV graft on 01/11/2018.  This was a 4-7 mm PTFE graft.  The graft has been working well although she has undergone revision of the arterial half of the graft when she presented with a disrupted ulcer along the arterial limb of the graft.  When I saw her initially she had a good thrill in her upper arm graft although the graft was slightly pulsatile along the arterial aspect of the graft.  She had a palpable right radial pulse.  This was a somewhat complicated situation in that given that she had a tapered graft it would be difficult to band this without significantly increasing the risk of graft thrombosis.  In addition the graft was somewhat pulsatile suggesting a possible outflow stenosis.  I therefore recommended a fistulogram to further evaluate this before considering banding.   The patient underwent a right upper extremity fistulogram by Dr. Stanford Breed on 10/31/2020.  This showed a mild stenosis in the graft outflow near the axilla.  This did not appear to be flow-limiting.  There was mild collateralization around the shoulder.  There was no flow-limiting stenosis in the axillary vein, subclavian vein, innominate vein, or vena cava.  Today the patient has no specific complaints.  Her right upper arm graft has been working well.  Her congestive heart failure has been under good control.  Current Outpatient Medications  Medication Sig Dispense Refill  . amLODipine (NORVASC) 10 MG tablet Take 10 mg by mouth at bedtime.    . calcium acetate (PHOSLO) 667 MG capsule Take 667 mg by mouth See admin instructions. Take 1 capsule (667 mg) by mouth with each meal & each  snack    . GLUCAGON EMERGENCY 1 MG injection Inject 1 mg into the muscle once as needed (for severe low blood sugar).     . Insulin Human (INSULIN PUMP) SOLN Inject into the skin continuous. Novolog    . labetalol (NORMODYNE) 300 MG tablet Take 300 mg by mouth 2 (two) times daily.    Marland Kitchen losartan (COZAAR) 50 MG tablet Take 50 mg by mouth at bedtime.    Marland Kitchen NOVOLOG 100 UNIT/ML injection Inject into the skin See admin instructions. Via Insulin pump    . rosuvastatin (CRESTOR) 20 MG tablet Take 20 mg by mouth at bedtime.     No current facility-administered medications for this visit.    REVIEW OF SYSTEMS:  '[X]'$  denotes positive finding, '[ ]'$  denotes negative finding Vascular    Leg swelling    Cardiac    Chest pain or chest pressure:    Shortness of breath upon exertion:    Short of breath when lying flat:    Irregular heart rhythm:    Constitutional    Fever or chills:     PHYSICAL EXAM:   Vitals:   11/14/20 1535  BP: 123/62  Pulse: 79  Resp: 20  Temp: 98.4 F (36.9 C)  SpO2: 96%  Weight: 127 lb (57.6 kg)  Height: '5\' 3"'$  (1.6 m)    GENERAL: The patient is a well-nourished female, in no acute distress. The vital signs are documented above. CARDIOVASCULAR: There is  a regular rate and rhythm. PULMONARY: There is good air exchange bilaterally without wheezing or rales. She has a good thrill in her right upper arm graft.  DATA:   I reviewed her fistulogram which was done on 10/31/2020 and showed only mild narrowing at the level of the axilla which did not appear to be flow-limiting.  MEDICAL ISSUES:   END-STAGE RENAL DISEASE: We were asked to evaluate her graft to consider banding of her access to help manage her congestive heart failure.  After reviewing her fistulogram I do not think she is a good candidate for banding of her AV graft as I think she would be at significant risk for graft thrombosis.  When I placed her graft in 2019 I used a 4-7 mm tapered graft to limit inflow to  the graft and lower her risk of steal.  Thus, banding the 4 mm of the graft I think would put her at significant risk for thrombosis.  She has some mild narrowing in the outflow vein at the level of the axilla but this does not appear to be flow-limiting.  I would not recommend ballooning this as this would actually increase flow through the graft and might have an impact on her congestive heart failure.  All things considered her graft is working well and I do not think banding it would be a good idea.  I will be happy to reevaluate her at any time if things change.  [name] [facility] [phone]

## 2020-11-14 NOTE — Progress Notes (Signed)
[name] arrived for drainage of right pleural drainage catheter. Patient has been on vacation and has been draining herself while out of town. Patient states the last time she drained was last Tuesday stating she had to go to the hospital in [place] for blood sugar issues. States they drained 129m at that time. Patient put out 1059mof yellow drainage today. No symptoms of SOB or pain at this time. Patient scheduled an appointment to return next Wednesday for next draining.

## 2020-11-15 DIAGNOSIS — N186 End stage renal disease: Secondary | ICD-10-CM | POA: Diagnosis not present

## 2020-11-15 DIAGNOSIS — Z992 Dependence on renal dialysis: Secondary | ICD-10-CM | POA: Diagnosis not present

## 2020-11-15 DIAGNOSIS — N2581 Secondary hyperparathyroidism of renal origin: Secondary | ICD-10-CM | POA: Diagnosis not present

## 2020-11-15 DIAGNOSIS — D689 Coagulation defect, unspecified: Secondary | ICD-10-CM | POA: Diagnosis not present

## 2020-11-15 DIAGNOSIS — E1029 Type 1 diabetes mellitus with other diabetic kidney complication: Secondary | ICD-10-CM | POA: Diagnosis not present

## 2020-11-17 DIAGNOSIS — E1029 Type 1 diabetes mellitus with other diabetic kidney complication: Secondary | ICD-10-CM | POA: Diagnosis not present

## 2020-11-17 DIAGNOSIS — Z992 Dependence on renal dialysis: Secondary | ICD-10-CM | POA: Diagnosis not present

## 2020-11-17 DIAGNOSIS — N2581 Secondary hyperparathyroidism of renal origin: Secondary | ICD-10-CM | POA: Diagnosis not present

## 2020-11-17 DIAGNOSIS — D689 Coagulation defect, unspecified: Secondary | ICD-10-CM | POA: Diagnosis not present

## 2020-11-17 DIAGNOSIS — N186 End stage renal disease: Secondary | ICD-10-CM | POA: Diagnosis not present

## 2020-11-20 DIAGNOSIS — N2581 Secondary hyperparathyroidism of renal origin: Secondary | ICD-10-CM | POA: Diagnosis not present

## 2020-11-20 DIAGNOSIS — D689 Coagulation defect, unspecified: Secondary | ICD-10-CM | POA: Diagnosis not present

## 2020-11-20 DIAGNOSIS — Z992 Dependence on renal dialysis: Secondary | ICD-10-CM | POA: Diagnosis not present

## 2020-11-20 DIAGNOSIS — N186 End stage renal disease: Secondary | ICD-10-CM | POA: Diagnosis not present

## 2020-11-21 ENCOUNTER — Other Ambulatory Visit: Payer: Self-pay

## 2020-11-21 ENCOUNTER — Ambulatory Visit (INDEPENDENT_AMBULATORY_CARE_PROVIDER_SITE_OTHER): Payer: Medicare HMO

## 2020-11-21 DIAGNOSIS — Z719 Counseling, unspecified: Secondary | ICD-10-CM

## 2020-11-21 DIAGNOSIS — J90 Pleural effusion, not elsewhere classified: Secondary | ICD-10-CM | POA: Diagnosis not present

## 2020-11-21 NOTE — Progress Notes (Signed)
Drained right sided Pleurx cath today in the office. The amount was 150 cc's of yellow pleural fluid. Patient tolerated well. She will return next week for pleurX drain again.

## 2020-11-22 DIAGNOSIS — Z992 Dependence on renal dialysis: Secondary | ICD-10-CM | POA: Diagnosis not present

## 2020-11-22 DIAGNOSIS — N186 End stage renal disease: Secondary | ICD-10-CM | POA: Diagnosis not present

## 2020-11-22 DIAGNOSIS — D689 Coagulation defect, unspecified: Secondary | ICD-10-CM | POA: Diagnosis not present

## 2020-11-22 DIAGNOSIS — N2581 Secondary hyperparathyroidism of renal origin: Secondary | ICD-10-CM | POA: Diagnosis not present

## 2020-11-24 DIAGNOSIS — N186 End stage renal disease: Secondary | ICD-10-CM | POA: Diagnosis not present

## 2020-11-24 DIAGNOSIS — D689 Coagulation defect, unspecified: Secondary | ICD-10-CM | POA: Diagnosis not present

## 2020-11-24 DIAGNOSIS — N2581 Secondary hyperparathyroidism of renal origin: Secondary | ICD-10-CM | POA: Diagnosis not present

## 2020-11-24 DIAGNOSIS — Z992 Dependence on renal dialysis: Secondary | ICD-10-CM | POA: Diagnosis not present

## 2020-11-27 ENCOUNTER — Other Ambulatory Visit: Payer: Self-pay | Admitting: Thoracic Surgery (Cardiothoracic Vascular Surgery)

## 2020-11-27 DIAGNOSIS — N2581 Secondary hyperparathyroidism of renal origin: Secondary | ICD-10-CM | POA: Diagnosis not present

## 2020-11-27 DIAGNOSIS — J90 Pleural effusion, not elsewhere classified: Secondary | ICD-10-CM

## 2020-11-27 DIAGNOSIS — Z992 Dependence on renal dialysis: Secondary | ICD-10-CM | POA: Diagnosis not present

## 2020-11-27 DIAGNOSIS — D689 Coagulation defect, unspecified: Secondary | ICD-10-CM | POA: Diagnosis not present

## 2020-11-27 DIAGNOSIS — N186 End stage renal disease: Secondary | ICD-10-CM | POA: Diagnosis not present

## 2020-11-28 ENCOUNTER — Ambulatory Visit: Payer: Medicare HMO | Admitting: Thoracic Surgery (Cardiothoracic Vascular Surgery)

## 2020-11-28 ENCOUNTER — Ambulatory Visit (INDEPENDENT_AMBULATORY_CARE_PROVIDER_SITE_OTHER): Payer: Medicare HMO | Admitting: *Deleted

## 2020-11-28 ENCOUNTER — Other Ambulatory Visit: Payer: Self-pay

## 2020-11-28 DIAGNOSIS — Z4802 Encounter for removal of sutures: Secondary | ICD-10-CM

## 2020-11-28 DIAGNOSIS — Z5189 Encounter for other specified aftercare: Secondary | ICD-10-CM

## 2020-11-28 NOTE — Progress Notes (Signed)
[name] arrived for drainage of right pleural catheter. 160ms of yellow fluid drained from PleurX. No c/o of SOB or pain during drainage. Patient will return next week for follow up with Dr. HRoxan Hockeywith CXR.

## 2020-11-29 DIAGNOSIS — Z992 Dependence on renal dialysis: Secondary | ICD-10-CM | POA: Diagnosis not present

## 2020-11-29 DIAGNOSIS — D689 Coagulation defect, unspecified: Secondary | ICD-10-CM | POA: Diagnosis not present

## 2020-11-29 DIAGNOSIS — N186 End stage renal disease: Secondary | ICD-10-CM | POA: Diagnosis not present

## 2020-11-29 DIAGNOSIS — N2581 Secondary hyperparathyroidism of renal origin: Secondary | ICD-10-CM | POA: Diagnosis not present

## 2020-11-30 DIAGNOSIS — E1022 Type 1 diabetes mellitus with diabetic chronic kidney disease: Secondary | ICD-10-CM | POA: Diagnosis not present

## 2020-12-01 DIAGNOSIS — D689 Coagulation defect, unspecified: Secondary | ICD-10-CM | POA: Diagnosis not present

## 2020-12-01 DIAGNOSIS — Z992 Dependence on renal dialysis: Secondary | ICD-10-CM | POA: Diagnosis not present

## 2020-12-01 DIAGNOSIS — N186 End stage renal disease: Secondary | ICD-10-CM | POA: Diagnosis not present

## 2020-12-01 DIAGNOSIS — N2581 Secondary hyperparathyroidism of renal origin: Secondary | ICD-10-CM | POA: Diagnosis not present

## 2020-12-04 DIAGNOSIS — D689 Coagulation defect, unspecified: Secondary | ICD-10-CM | POA: Diagnosis not present

## 2020-12-04 DIAGNOSIS — E1029 Type 1 diabetes mellitus with other diabetic kidney complication: Secondary | ICD-10-CM | POA: Diagnosis not present

## 2020-12-04 DIAGNOSIS — D631 Anemia in chronic kidney disease: Secondary | ICD-10-CM | POA: Diagnosis not present

## 2020-12-04 DIAGNOSIS — Z992 Dependence on renal dialysis: Secondary | ICD-10-CM | POA: Diagnosis not present

## 2020-12-04 DIAGNOSIS — N186 End stage renal disease: Secondary | ICD-10-CM | POA: Diagnosis not present

## 2020-12-04 DIAGNOSIS — N2581 Secondary hyperparathyroidism of renal origin: Secondary | ICD-10-CM | POA: Diagnosis not present

## 2020-12-05 ENCOUNTER — Other Ambulatory Visit: Payer: Self-pay

## 2020-12-05 ENCOUNTER — Ambulatory Visit
Admission: RE | Admit: 2020-12-05 | Discharge: 2020-12-05 | Disposition: A | Discharge status: Home / Self Care | Payer: Medicare HMO | Source: Ambulatory Visit | Attending: Thoracic Surgery (Cardiothoracic Vascular Surgery) | Admitting: Thoracic Surgery (Cardiothoracic Vascular Surgery)

## 2020-12-05 ENCOUNTER — Ambulatory Visit (INDEPENDENT_AMBULATORY_CARE_PROVIDER_SITE_OTHER): Payer: Medicare HMO | Admitting: Thoracic Surgery (Cardiothoracic Vascular Surgery)

## 2020-12-05 ENCOUNTER — Encounter: Payer: Self-pay | Admitting: Thoracic Surgery (Cardiothoracic Vascular Surgery)

## 2020-12-05 VITALS — BP 156/73 | HR 90 | Resp 20 | Ht 63.0 in | Wt 129.0 lb

## 2020-12-05 DIAGNOSIS — J9811 Atelectasis: Secondary | ICD-10-CM | POA: Diagnosis not present

## 2020-12-05 DIAGNOSIS — E119 Type 2 diabetes mellitus without complications: Secondary | ICD-10-CM | POA: Diagnosis not present

## 2020-12-05 DIAGNOSIS — J90 Pleural effusion, not elsewhere classified: Secondary | ICD-10-CM

## 2020-12-05 DIAGNOSIS — I119 Hypertensive heart disease without heart failure: Secondary | ICD-10-CM | POA: Diagnosis not present

## 2020-12-05 NOTE — Progress Notes (Signed)
[address]       [place] [address]             [phone]    HPI: [name] returns for follow-up of her right pleural effusion  Etosha Hrbek is a 45 year old woman with renal failure on hemodialysis, type 1 diabetes, diabetic retinopathy, neuropathy, right heart failure, tricuspid regurgitation, hypertension, hyperlipidemia, ascites, and recurrent right pleural effusions.  I placed a pleural catheter in December for palliative management of her recurrent right pleural effusion.  Initially she was draining twice weekly.  About half a liter at a time initially.  Over time that had tapered off to less than 250 mL.  For the past months he been draining once a week and has had about 150 mL each time.  Symptomatically she has been doing well.  She not have any problems with her breathing currently.  Vascular surgery decided not to band her AV graft.  Her ascites has been better as well.  Past Medical History:  Diagnosis Date  . Anemia   . Arthritis   . Ascites 03/28/2020  . CHF (congestive heart failure) ([place])   . Diabetes mellitus   . Diabetic retinopathy ([place])    Hx of laser Rx's  . DM type 1 (diabetes mellitus, type 1) ([place]) 09/12/2014   Age of onset for DM type 1 was age 16.     . Enlarged thyroid gland   . ESRD on hemodialysis ([place])    ESRD due to DM type I, age of onset DM 1 was age 46.  Went on dialysis in March 2012.  Gets HD now at [facility] on a TTS schedule.    Marland Kitchen ESRD on hemodialysis ([place]) 09/12/2014   ESRD due to DM type 1.  Started HD in 2012 at [facility].  Gets HD there on a TTS schedule.    Marland Kitchen GERD (gastroesophageal reflux disease)   . History of blood transfusion   . Hyperlipidemia   . Hypertension   . Peripheral vascular disease ([place])   . Pneumonia     Current Outpatient Medications  Medication Sig Dispense Refill  . amLODipine (NORVASC) 10 MG tablet Take 10 mg by mouth at bedtime.    . calcium acetate (PHOSLO) 667 MG capsule Take  667 mg by mouth See admin instructions. Take 1 capsule (667 mg) by mouth with each meal & each snack    . GLUCAGON EMERGENCY 1 MG injection Inject 1 mg into the muscle once as needed (for severe low blood sugar).     . Insulin Human (INSULIN PUMP) SOLN Inject into the skin continuous. Novolog    . labetalol (NORMODYNE) 300 MG tablet Take 300 mg by mouth 2 (two) times daily.    Marland Kitchen losartan (COZAAR) 50 MG tablet Take 50 mg by mouth at bedtime.    Marland Kitchen NOVOLOG 100 UNIT/ML injection Inject into the skin See admin instructions. Via Insulin pump    . rosuvastatin (CRESTOR) 20 MG tablet Take 20 mg by mouth at bedtime.     No current facility-administered medications for this visit.    Physical Exam BP (!) 156/73 (BP Location: Left Wrist, Patient Position: Sitting)   Pulse 90   Resp 20   Ht '5\' 3"'$  (1.6 m)   Wt 129 lb (58.5 kg)   LMP 12/21/2017   SpO2 91% Comment: RA  BMI 22.26 kg/m  45 year old woman in no acute distress Alert and oriented x3 with no focal deficits  Lungs clear with equal breath sounds bilaterally Catheter site with some minimal eschar but otherwise clean No peripheral edema  Diagnostic Tests: I personally reviewed her chest x-ray.  It shows no significant effusion.  Impression: Bassy Eakins is a 45 year old woman with end-stage renal disease on hemodialysis with right heart failure and tricuspid regurgitation.  Thought in large part to be due to shunting through her AV graft.  She was in decompensated heart failure with a recurring right pleural effusion.  She also was having a lot of trouble with ascites.  We placed a pleural catheter for palliative management.  That was back in December.  Over the past couple of months she has improved dramatically.  She is having far less drainage from the catheter.  Her breathing is more comfortable.  She is having less ascites.  She is currently draining her catheter once a week and is having about 150 mL at a time.  Today in the office  she only drained 100 mL of serous fluid.  I recommended that she not drain the catheter for 3 weeks.  If she were to get short of breath she can call us and come by and we can drain the catheter.  We will check again in 3 weeks if the drainage is still minimal we will plan to remove the catheter at that time.  I spent over 10 minutes in review of records, images, and in consultation with Ms. Arnaud today.   Melrose Nakayama, MD [facility] [phone]

## 2020-12-06 DIAGNOSIS — E1029 Type 1 diabetes mellitus with other diabetic kidney complication: Secondary | ICD-10-CM | POA: Diagnosis not present

## 2020-12-06 DIAGNOSIS — N186 End stage renal disease: Secondary | ICD-10-CM | POA: Diagnosis not present

## 2020-12-06 DIAGNOSIS — D631 Anemia in chronic kidney disease: Secondary | ICD-10-CM | POA: Diagnosis not present

## 2020-12-06 DIAGNOSIS — N2581 Secondary hyperparathyroidism of renal origin: Secondary | ICD-10-CM | POA: Diagnosis not present

## 2020-12-06 DIAGNOSIS — Z992 Dependence on renal dialysis: Secondary | ICD-10-CM | POA: Diagnosis not present

## 2020-12-06 DIAGNOSIS — D689 Coagulation defect, unspecified: Secondary | ICD-10-CM | POA: Diagnosis not present

## 2020-12-08 DIAGNOSIS — N2581 Secondary hyperparathyroidism of renal origin: Secondary | ICD-10-CM | POA: Diagnosis not present

## 2020-12-08 DIAGNOSIS — N186 End stage renal disease: Secondary | ICD-10-CM | POA: Diagnosis not present

## 2020-12-08 DIAGNOSIS — Z992 Dependence on renal dialysis: Secondary | ICD-10-CM | POA: Diagnosis not present

## 2020-12-08 DIAGNOSIS — D689 Coagulation defect, unspecified: Secondary | ICD-10-CM | POA: Diagnosis not present

## 2020-12-08 DIAGNOSIS — D631 Anemia in chronic kidney disease: Secondary | ICD-10-CM | POA: Diagnosis not present

## 2020-12-08 DIAGNOSIS — E1029 Type 1 diabetes mellitus with other diabetic kidney complication: Secondary | ICD-10-CM | POA: Diagnosis not present

## 2020-12-11 DIAGNOSIS — D689 Coagulation defect, unspecified: Secondary | ICD-10-CM | POA: Diagnosis not present

## 2020-12-11 DIAGNOSIS — Z992 Dependence on renal dialysis: Secondary | ICD-10-CM | POA: Diagnosis not present

## 2020-12-11 DIAGNOSIS — N186 End stage renal disease: Secondary | ICD-10-CM | POA: Diagnosis not present

## 2020-12-11 DIAGNOSIS — N2581 Secondary hyperparathyroidism of renal origin: Secondary | ICD-10-CM | POA: Diagnosis not present

## 2020-12-13 DIAGNOSIS — N186 End stage renal disease: Secondary | ICD-10-CM | POA: Diagnosis not present

## 2020-12-13 DIAGNOSIS — D689 Coagulation defect, unspecified: Secondary | ICD-10-CM | POA: Diagnosis not present

## 2020-12-13 DIAGNOSIS — N2581 Secondary hyperparathyroidism of renal origin: Secondary | ICD-10-CM | POA: Diagnosis not present

## 2020-12-13 DIAGNOSIS — Z992 Dependence on renal dialysis: Secondary | ICD-10-CM | POA: Diagnosis not present

## 2020-12-15 DIAGNOSIS — N186 End stage renal disease: Secondary | ICD-10-CM | POA: Diagnosis not present

## 2020-12-15 DIAGNOSIS — Z992 Dependence on renal dialysis: Secondary | ICD-10-CM | POA: Diagnosis not present

## 2020-12-15 DIAGNOSIS — N2581 Secondary hyperparathyroidism of renal origin: Secondary | ICD-10-CM | POA: Diagnosis not present

## 2020-12-15 DIAGNOSIS — D689 Coagulation defect, unspecified: Secondary | ICD-10-CM | POA: Diagnosis not present

## 2020-12-18 DIAGNOSIS — N2581 Secondary hyperparathyroidism of renal origin: Secondary | ICD-10-CM | POA: Diagnosis not present

## 2020-12-18 DIAGNOSIS — N186 End stage renal disease: Secondary | ICD-10-CM | POA: Diagnosis not present

## 2020-12-18 DIAGNOSIS — E1029 Type 1 diabetes mellitus with other diabetic kidney complication: Secondary | ICD-10-CM | POA: Diagnosis not present

## 2020-12-18 DIAGNOSIS — Z992 Dependence on renal dialysis: Secondary | ICD-10-CM | POA: Diagnosis not present

## 2020-12-18 DIAGNOSIS — D689 Coagulation defect, unspecified: Secondary | ICD-10-CM | POA: Diagnosis not present

## 2020-12-20 DIAGNOSIS — N2581 Secondary hyperparathyroidism of renal origin: Secondary | ICD-10-CM | POA: Diagnosis not present

## 2020-12-20 DIAGNOSIS — Z992 Dependence on renal dialysis: Secondary | ICD-10-CM | POA: Diagnosis not present

## 2020-12-20 DIAGNOSIS — N186 End stage renal disease: Secondary | ICD-10-CM | POA: Diagnosis not present

## 2020-12-20 DIAGNOSIS — D689 Coagulation defect, unspecified: Secondary | ICD-10-CM | POA: Diagnosis not present

## 2020-12-21 ENCOUNTER — Other Ambulatory Visit: Payer: Self-pay | Admitting: Thoracic Surgery (Cardiothoracic Vascular Surgery)

## 2020-12-21 DIAGNOSIS — J90 Pleural effusion, not elsewhere classified: Secondary | ICD-10-CM

## 2020-12-22 DIAGNOSIS — N2581 Secondary hyperparathyroidism of renal origin: Secondary | ICD-10-CM | POA: Diagnosis not present

## 2020-12-22 DIAGNOSIS — Z992 Dependence on renal dialysis: Secondary | ICD-10-CM | POA: Diagnosis not present

## 2020-12-22 DIAGNOSIS — N186 End stage renal disease: Secondary | ICD-10-CM | POA: Diagnosis not present

## 2020-12-22 DIAGNOSIS — D689 Coagulation defect, unspecified: Secondary | ICD-10-CM | POA: Diagnosis not present

## 2020-12-24 ENCOUNTER — Other Ambulatory Visit: Payer: Self-pay

## 2020-12-24 ENCOUNTER — Encounter: Payer: Self-pay | Admitting: Thoracic Surgery (Cardiothoracic Vascular Surgery)

## 2020-12-24 ENCOUNTER — Ambulatory Visit (INDEPENDENT_AMBULATORY_CARE_PROVIDER_SITE_OTHER): Payer: Medicare HMO | Admitting: Thoracic Surgery (Cardiothoracic Vascular Surgery)

## 2020-12-24 ENCOUNTER — Ambulatory Visit
Admission: RE | Admit: 2020-12-24 | Discharge: 2020-12-24 | Disposition: A | Discharge status: Home / Self Care | Payer: Medicare HMO | Source: Ambulatory Visit | Attending: Thoracic Surgery (Cardiothoracic Vascular Surgery) | Admitting: Thoracic Surgery (Cardiothoracic Vascular Surgery)

## 2020-12-24 VITALS — BP 149/66 | HR 86 | Resp 20 | Wt 128.0 lb

## 2020-12-24 DIAGNOSIS — I517 Cardiomegaly: Secondary | ICD-10-CM | POA: Diagnosis not present

## 2020-12-24 DIAGNOSIS — J90 Pleural effusion, not elsewhere classified: Secondary | ICD-10-CM

## 2020-12-24 DIAGNOSIS — Z8709 Personal history of other diseases of the respiratory system: Secondary | ICD-10-CM | POA: Diagnosis not present

## 2020-12-24 NOTE — Progress Notes (Signed)
[address]       [place] [address]             [phone]    HPI: Ms. Osuch returns for follow-up of her right pleural effusion  Tashima Magnussen is a 45 year old woman with a history of end-stage renal disease on hemodialysis, type 1 diabetes, diabetic retinopathy, neuropathy, right heart failure, tricuspid regurgitation, hypertension, hyperlipidemia, ascites, and recurrent right pleural effusions.  I placed a pleural catheter in December 2021.  Initially she drained about 500 mL a couple times a week.  Over time that tapered down.  I last saw her on 12/05/2020 and she had minimal drainage.  She now has not drained the catheter for the past 3 weeks and returns for follow-up.  She has not noticed any shortness of breath developing during that time interval.  Past Medical History:  Diagnosis Date  . Anemia   . Arthritis   . Ascites 03/28/2020  . CHF (congestive heart failure) ([place])   . Diabetes mellitus   . Diabetic retinopathy ([place])    Hx of laser Rx's  . DM type 1 (diabetes mellitus, type 1) ([place]) 09/12/2014   Age of onset for DM type 1 was age 73.     . Enlarged thyroid gland   . ESRD on hemodialysis ([place])    ESRD due to DM type I, age of onset DM 1 was age 31.  Went on dialysis in March 2012.  Gets HD now at [facility] on a TTS schedule.    Marland Kitchen ESRD on hemodialysis ([place]) 09/12/2014   ESRD due to DM type 1.  Started HD in 2012 at [facility].  Gets HD there on a TTS schedule.    Marland Kitchen GERD (gastroesophageal reflux disease)   . History of blood transfusion   . Hyperlipidemia   . Hypertension   . Peripheral vascular disease ([place])   . Pneumonia     Current Outpatient Medications  Medication Sig Dispense Refill  . amLODipine (NORVASC) 10 MG tablet Take 10 mg by mouth at bedtime.    . calcium acetate (PHOSLO) 667 MG capsule Take 667 mg by mouth See admin instructions. Take 1 capsule (667 mg) by mouth with each meal & each snack    . GLUCAGON EMERGENCY 1 MG  injection Inject 1 mg into the muscle once as needed (for severe low blood sugar).     . Insulin Human (INSULIN PUMP) SOLN Inject into the skin continuous. Novolog    . labetalol (NORMODYNE) 300 MG tablet Take 300 mg by mouth 2 (two) times daily.    Marland Kitchen losartan (COZAAR) 50 MG tablet Take 50 mg by mouth at bedtime.    Marland Kitchen NOVOLOG 100 UNIT/ML injection Inject into the skin See admin instructions. Via Insulin pump    . rosuvastatin (CRESTOR) 20 MG tablet Take 20 mg by mouth at bedtime.     No current facility-administered medications for this visit.    Physical Exam BP (!) 149/66   Pulse 86   Resp 20   Wt 128 lb (58.1 kg)   LMP 12/21/2017   SpO2 98%   BMI 22.19 kg/m  45 year old woman in no acute distress Lungs clear bilaterally Pleurx catheter in place site clean with minimal eschar Ascites  Diagnostic Tests: I personally reviewed the chest x-ray images.  There is no residual pleural effusion.  Impression: [name]  is a 45 year old woman with a history of end-stage renal disease on hemodialysis,  type 1 diabetes, diabetic retinopathy, neuropathy, right heart failure, tricuspid regurgitation, hypertension, hyperlipidemia, ascites, and recurrent right pleural effusions.  She had recurrent right pleural effusions which were really affecting her quality of life.  We placed a pleural catheter for palliation back in December.  Over time her output has decreased.  She returned today after not draining the catheter in 3 weeks.  She is not had any return of shortness of breath.  Her chest x-ray is unchanged.  We placed the catheter to suction and there was no output.  We discussed removal of the catheter.  She wishes to proceed with that.  Procedure note Sterile technique utilized Local anesthesia with 5 mL of 1% lidocaine After gently aspirating the skin subcutaneous tissue around the cuff of the catheter, the pleural catheter was removed intact.  She did experience some mild  discomfort with removal.  Plan: Return in 6 weeks with PA and lateral chest x-ray  I spent over 20 minutes in review of records, images, and in person with Ms. Aaberg today. Melrose Nakayama, MD [facility] [phone]

## 2020-12-25 DIAGNOSIS — N2581 Secondary hyperparathyroidism of renal origin: Secondary | ICD-10-CM | POA: Diagnosis not present

## 2020-12-25 DIAGNOSIS — D689 Coagulation defect, unspecified: Secondary | ICD-10-CM | POA: Diagnosis not present

## 2020-12-25 DIAGNOSIS — E1029 Type 1 diabetes mellitus with other diabetic kidney complication: Secondary | ICD-10-CM | POA: Diagnosis not present

## 2020-12-25 DIAGNOSIS — Z992 Dependence on renal dialysis: Secondary | ICD-10-CM | POA: Diagnosis not present

## 2020-12-25 DIAGNOSIS — N186 End stage renal disease: Secondary | ICD-10-CM | POA: Diagnosis not present

## 2020-12-27 DIAGNOSIS — E1029 Type 1 diabetes mellitus with other diabetic kidney complication: Secondary | ICD-10-CM | POA: Diagnosis not present

## 2020-12-27 DIAGNOSIS — D689 Coagulation defect, unspecified: Secondary | ICD-10-CM | POA: Diagnosis not present

## 2020-12-27 DIAGNOSIS — Z992 Dependence on renal dialysis: Secondary | ICD-10-CM | POA: Diagnosis not present

## 2020-12-27 DIAGNOSIS — N2581 Secondary hyperparathyroidism of renal origin: Secondary | ICD-10-CM | POA: Diagnosis not present

## 2020-12-27 DIAGNOSIS — N186 End stage renal disease: Secondary | ICD-10-CM | POA: Diagnosis not present

## 2020-12-28 DIAGNOSIS — I119 Hypertensive heart disease without heart failure: Secondary | ICD-10-CM | POA: Diagnosis not present

## 2020-12-28 DIAGNOSIS — I10 Essential (primary) hypertension: Secondary | ICD-10-CM | POA: Diagnosis not present

## 2020-12-28 DIAGNOSIS — D638 Anemia in other chronic diseases classified elsewhere: Secondary | ICD-10-CM | POA: Diagnosis not present

## 2020-12-28 DIAGNOSIS — E1165 Type 2 diabetes mellitus with hyperglycemia: Secondary | ICD-10-CM | POA: Diagnosis not present

## 2020-12-28 DIAGNOSIS — Z0001 Encounter for general adult medical examination with abnormal findings: Secondary | ICD-10-CM | POA: Diagnosis not present

## 2020-12-28 DIAGNOSIS — E782 Mixed hyperlipidemia: Secondary | ICD-10-CM | POA: Diagnosis not present

## 2020-12-29 DIAGNOSIS — E1029 Type 1 diabetes mellitus with other diabetic kidney complication: Secondary | ICD-10-CM | POA: Diagnosis not present

## 2020-12-29 DIAGNOSIS — D689 Coagulation defect, unspecified: Secondary | ICD-10-CM | POA: Diagnosis not present

## 2020-12-29 DIAGNOSIS — Z992 Dependence on renal dialysis: Secondary | ICD-10-CM | POA: Diagnosis not present

## 2020-12-29 DIAGNOSIS — N2581 Secondary hyperparathyroidism of renal origin: Secondary | ICD-10-CM | POA: Diagnosis not present

## 2020-12-29 DIAGNOSIS — N186 End stage renal disease: Secondary | ICD-10-CM | POA: Diagnosis not present

## 2020-12-31 DIAGNOSIS — Z9641 Presence of insulin pump (external) (internal): Secondary | ICD-10-CM | POA: Diagnosis not present

## 2020-12-31 DIAGNOSIS — Z992 Dependence on renal dialysis: Secondary | ICD-10-CM | POA: Diagnosis not present

## 2020-12-31 DIAGNOSIS — N186 End stage renal disease: Secondary | ICD-10-CM | POA: Diagnosis not present

## 2020-12-31 DIAGNOSIS — E1022 Type 1 diabetes mellitus with diabetic chronic kidney disease: Secondary | ICD-10-CM | POA: Diagnosis not present

## 2020-12-31 DIAGNOSIS — E103593 Type 1 diabetes mellitus with proliferative diabetic retinopathy without macular edema, bilateral: Secondary | ICD-10-CM | POA: Diagnosis not present

## 2021-01-01 DIAGNOSIS — D689 Coagulation defect, unspecified: Secondary | ICD-10-CM | POA: Diagnosis not present

## 2021-01-01 DIAGNOSIS — N186 End stage renal disease: Secondary | ICD-10-CM | POA: Diagnosis not present

## 2021-01-01 DIAGNOSIS — E1029 Type 1 diabetes mellitus with other diabetic kidney complication: Secondary | ICD-10-CM | POA: Diagnosis not present

## 2021-01-01 DIAGNOSIS — Z992 Dependence on renal dialysis: Secondary | ICD-10-CM | POA: Diagnosis not present

## 2021-01-01 DIAGNOSIS — N2581 Secondary hyperparathyroidism of renal origin: Secondary | ICD-10-CM | POA: Diagnosis not present

## 2021-01-02 DIAGNOSIS — E1022 Type 1 diabetes mellitus with diabetic chronic kidney disease: Secondary | ICD-10-CM | POA: Diagnosis not present

## 2021-01-03 DIAGNOSIS — E1029 Type 1 diabetes mellitus with other diabetic kidney complication: Secondary | ICD-10-CM | POA: Diagnosis not present

## 2021-01-03 DIAGNOSIS — D689 Coagulation defect, unspecified: Secondary | ICD-10-CM | POA: Diagnosis not present

## 2021-01-03 DIAGNOSIS — N186 End stage renal disease: Secondary | ICD-10-CM | POA: Diagnosis not present

## 2021-01-03 DIAGNOSIS — Z992 Dependence on renal dialysis: Secondary | ICD-10-CM | POA: Diagnosis not present

## 2021-01-03 DIAGNOSIS — N2581 Secondary hyperparathyroidism of renal origin: Secondary | ICD-10-CM | POA: Diagnosis not present

## 2021-01-05 DIAGNOSIS — D689 Coagulation defect, unspecified: Secondary | ICD-10-CM | POA: Diagnosis not present

## 2021-01-05 DIAGNOSIS — N186 End stage renal disease: Secondary | ICD-10-CM | POA: Diagnosis not present

## 2021-01-05 DIAGNOSIS — Z992 Dependence on renal dialysis: Secondary | ICD-10-CM | POA: Diagnosis not present

## 2021-01-05 DIAGNOSIS — E1029 Type 1 diabetes mellitus with other diabetic kidney complication: Secondary | ICD-10-CM | POA: Diagnosis not present

## 2021-01-05 DIAGNOSIS — N2581 Secondary hyperparathyroidism of renal origin: Secondary | ICD-10-CM | POA: Diagnosis not present

## 2021-01-08 DIAGNOSIS — D689 Coagulation defect, unspecified: Secondary | ICD-10-CM | POA: Diagnosis not present

## 2021-01-08 DIAGNOSIS — N186 End stage renal disease: Secondary | ICD-10-CM | POA: Diagnosis not present

## 2021-01-08 DIAGNOSIS — N2581 Secondary hyperparathyroidism of renal origin: Secondary | ICD-10-CM | POA: Diagnosis not present

## 2021-01-08 DIAGNOSIS — Z992 Dependence on renal dialysis: Secondary | ICD-10-CM | POA: Diagnosis not present

## 2021-01-10 DIAGNOSIS — Z992 Dependence on renal dialysis: Secondary | ICD-10-CM | POA: Diagnosis not present

## 2021-01-10 DIAGNOSIS — N186 End stage renal disease: Secondary | ICD-10-CM | POA: Diagnosis not present

## 2021-01-10 DIAGNOSIS — D689 Coagulation defect, unspecified: Secondary | ICD-10-CM | POA: Diagnosis not present

## 2021-01-10 DIAGNOSIS — N2581 Secondary hyperparathyroidism of renal origin: Secondary | ICD-10-CM | POA: Diagnosis not present

## 2021-01-12 DIAGNOSIS — N2581 Secondary hyperparathyroidism of renal origin: Secondary | ICD-10-CM | POA: Diagnosis not present

## 2021-01-12 DIAGNOSIS — N186 End stage renal disease: Secondary | ICD-10-CM | POA: Diagnosis not present

## 2021-01-12 DIAGNOSIS — Z992 Dependence on renal dialysis: Secondary | ICD-10-CM | POA: Diagnosis not present

## 2021-01-12 DIAGNOSIS — D689 Coagulation defect, unspecified: Secondary | ICD-10-CM | POA: Diagnosis not present

## 2021-01-15 DIAGNOSIS — Z992 Dependence on renal dialysis: Secondary | ICD-10-CM | POA: Diagnosis not present

## 2021-01-15 DIAGNOSIS — D689 Coagulation defect, unspecified: Secondary | ICD-10-CM | POA: Diagnosis not present

## 2021-01-15 DIAGNOSIS — N186 End stage renal disease: Secondary | ICD-10-CM | POA: Diagnosis not present

## 2021-01-15 DIAGNOSIS — N2581 Secondary hyperparathyroidism of renal origin: Secondary | ICD-10-CM | POA: Diagnosis not present

## 2021-01-17 DIAGNOSIS — N186 End stage renal disease: Secondary | ICD-10-CM | POA: Diagnosis not present

## 2021-01-17 DIAGNOSIS — N2581 Secondary hyperparathyroidism of renal origin: Secondary | ICD-10-CM | POA: Diagnosis not present

## 2021-01-17 DIAGNOSIS — Z992 Dependence on renal dialysis: Secondary | ICD-10-CM | POA: Diagnosis not present

## 2021-01-17 DIAGNOSIS — E1029 Type 1 diabetes mellitus with other diabetic kidney complication: Secondary | ICD-10-CM | POA: Diagnosis not present

## 2021-01-17 DIAGNOSIS — D689 Coagulation defect, unspecified: Secondary | ICD-10-CM | POA: Diagnosis not present

## 2021-01-19 DIAGNOSIS — D689 Coagulation defect, unspecified: Secondary | ICD-10-CM | POA: Diagnosis not present

## 2021-01-19 DIAGNOSIS — N2581 Secondary hyperparathyroidism of renal origin: Secondary | ICD-10-CM | POA: Diagnosis not present

## 2021-01-19 DIAGNOSIS — Z992 Dependence on renal dialysis: Secondary | ICD-10-CM | POA: Diagnosis not present

## 2021-01-19 DIAGNOSIS — N186 End stage renal disease: Secondary | ICD-10-CM | POA: Diagnosis not present

## 2021-01-22 DIAGNOSIS — Z992 Dependence on renal dialysis: Secondary | ICD-10-CM | POA: Diagnosis not present

## 2021-01-22 DIAGNOSIS — N2581 Secondary hyperparathyroidism of renal origin: Secondary | ICD-10-CM | POA: Diagnosis not present

## 2021-01-22 DIAGNOSIS — N186 End stage renal disease: Secondary | ICD-10-CM | POA: Diagnosis not present

## 2021-01-22 DIAGNOSIS — D689 Coagulation defect, unspecified: Secondary | ICD-10-CM | POA: Diagnosis not present

## 2021-01-24 DIAGNOSIS — Z992 Dependence on renal dialysis: Secondary | ICD-10-CM | POA: Diagnosis not present

## 2021-01-24 DIAGNOSIS — N2581 Secondary hyperparathyroidism of renal origin: Secondary | ICD-10-CM | POA: Diagnosis not present

## 2021-01-24 DIAGNOSIS — N186 End stage renal disease: Secondary | ICD-10-CM | POA: Diagnosis not present

## 2021-01-24 DIAGNOSIS — D689 Coagulation defect, unspecified: Secondary | ICD-10-CM | POA: Diagnosis not present

## 2021-01-25 ENCOUNTER — Other Ambulatory Visit: Payer: Self-pay | Admitting: Thoracic Surgery (Cardiothoracic Vascular Surgery)

## 2021-01-25 DIAGNOSIS — J90 Pleural effusion, not elsewhere classified: Secondary | ICD-10-CM

## 2021-01-26 DIAGNOSIS — D689 Coagulation defect, unspecified: Secondary | ICD-10-CM | POA: Diagnosis not present

## 2021-01-26 DIAGNOSIS — N2581 Secondary hyperparathyroidism of renal origin: Secondary | ICD-10-CM | POA: Diagnosis not present

## 2021-01-26 DIAGNOSIS — Z992 Dependence on renal dialysis: Secondary | ICD-10-CM | POA: Diagnosis not present

## 2021-01-26 DIAGNOSIS — N186 End stage renal disease: Secondary | ICD-10-CM | POA: Diagnosis not present

## 2021-01-28 ENCOUNTER — Ambulatory Visit: Payer: Medicare HMO | Admitting: Thoracic Surgery (Cardiothoracic Vascular Surgery)

## 2021-01-28 ENCOUNTER — Other Ambulatory Visit: Payer: Self-pay

## 2021-01-28 ENCOUNTER — Ambulatory Visit
Admission: RE | Admit: 2021-01-28 | Discharge: 2021-01-28 | Disposition: A | Discharge status: Home / Self Care | Payer: Medicare HMO | Source: Ambulatory Visit | Attending: Thoracic Surgery (Cardiothoracic Vascular Surgery) | Admitting: Thoracic Surgery (Cardiothoracic Vascular Surgery)

## 2021-01-28 ENCOUNTER — Ambulatory Visit (INDEPENDENT_AMBULATORY_CARE_PROVIDER_SITE_OTHER): Payer: Medicare HMO | Admitting: Surgical

## 2021-01-28 VITALS — BP 118/63 | HR 85 | Resp 20 | Ht 63.0 in | Wt 128.6 lb

## 2021-01-28 DIAGNOSIS — I517 Cardiomegaly: Secondary | ICD-10-CM | POA: Diagnosis not present

## 2021-01-28 DIAGNOSIS — R918 Other nonspecific abnormal finding of lung field: Secondary | ICD-10-CM | POA: Diagnosis not present

## 2021-01-28 DIAGNOSIS — J90 Pleural effusion, not elsewhere classified: Secondary | ICD-10-CM

## 2021-01-28 DIAGNOSIS — Z9889 Other specified postprocedural states: Secondary | ICD-10-CM | POA: Diagnosis not present

## 2021-01-28 NOTE — Patient Instructions (Signed)
No specific instructions 

## 2021-01-28 NOTE — Progress Notes (Signed)
[address]       [place] [address]             [phone]      Laasia S Garlock [facility]all Medical Record [id number] Date of Birth: 08/25/1975  Referring: Kinnie Feil, MD Primary Care: Trey Sailors, [place] Primary Cardiologist: None   Chief Complaint:   POST OP FOLLOW UP  History of Present Illness:    The patient is a 45 year old female with a history of end-stage renal disease on hemodialysis who had a pleural catheter placed in December 2001 by Dr. Roxan Hockey for asymptomatic right pleural effusion.  She was last seen in the office on 12/24/2020 at which time her catheter was removed.  She is seen in the office today for repeat chest x-ray to evaluate for recurrence as well as symptom reevaluation.  She reports she is doing quite well.  She denies symptomatic shortness of breath.  She is doing well with her dialysis and has no specific complaints at this time.      Past Medical History:  Diagnosis Date   Anemia    Arthritis    Ascites 03/28/2020   CHF (congestive heart failure) (H[facility])    Diabetes mellitus    Diabetic retinopathy ([place])    Hx of laser Rx's   DM type 1 (diabetes mellitus, type 1) ([place]) 09/12/2014   Age of onset for DM type 1 was age 22.      Enlarged thyroid gland    ESRD on hemodialysis ([place])    ESRD due to DM type I, age of onset DM 1 was age 25.  Went on dialysis in March 2012.  Gets HD now at [facility] on a TTS schedule.     ESRD on hemodialysis ([place]) 09/12/2014   ESRD due to DM type 1.  Started HD in 2012 at [facility].  Gets HD there on a TTS schedule.     GERD (gastroesophageal reflux disease)    History of blood transfusion    Hyperlipidemia    Hypertension    Peripheral vascular disease (H[facility])    Pneumonia      Social History   Tobacco Use  Smoking Status Never  Smokeless Tobacco Never    Social History   Substance and Sexual Activity  Alcohol Use No   Alcohol/week: 0.0 standard drinks      Allergies  Allergen Reactions   Pollen Extract Other (See Comments)    Watery eyes    Current Outpatient Medications  Medication Sig Dispense Refill   amLODipine (NORVASC) 10 MG tablet Take 10 mg by mouth at bedtime.     calcium acetate (PHOSLO) 667 MG capsule Take 667 mg by mouth See admin instructions. Take 1 capsule (667 mg) by mouth with each meal & each snack     GLUCAGON EMERGENCY 1 MG injection Inject 1 mg into the muscle once as needed (for severe low blood sugar).      Insulin Human (INSULIN PUMP) SOLN Inject into the skin continuous. Novolog     labetalol (NORMODYNE) 300 MG tablet Take 300 mg by mouth 2 (two) times daily.     losartan (COZAAR) 50 MG tablet Take 50 mg by mouth at bedtime.     NOVOLOG 100 UNIT/ML injection Inject into the skin See admin instructions. Via Insulin pump     rosuvastatin (CRESTOR) 20 MG tablet Take 20 mg by mouth at bedtime.     No current facility-administered  medications for this visit.       Physical Exam: BP 118/63 (BP Location: Left Arm, Patient Position: Sitting, Cuff Size: Normal) Comment (BP Location): forearm  Pulse 85   Resp 20   Ht '5\' 3"'$  (1.6 m)   Wt 128 lb 9.6 oz (58.3 kg)   LMP 12/21/2017   SpO2 92% Comment: RA  BMI 22.78 kg/m   General appearance: alert, cooperative, and no distress Heart: regular rate and rhythm Lungs: clear to auscultation bilaterally   Diagnostic Studies & Laboratory data:     Recent Radiology Findings:   No results found.    Recent Lab Findings: Lab Results  Component Value Date   WBC 7.1 07/27/2020   HGB 12.9 10/31/2020   HCT 38.0 10/31/2020   PLT 214 07/27/2020   GLUCOSE 123 (H) 10/31/2020   CHOL 202 (H) 03/30/2014   TRIG 176 (H) 03/30/2014   HDL 67 03/30/2014   LDLCALC 100 (H) 03/30/2014   ALT 8 06/01/2020   AST 16 06/01/2020   NA 136 10/31/2020   K 6.0 (H) 10/31/2020   CL 100 10/31/2020   CREATININE 6.10 (H) 10/31/2020   BUN 30 (H) 10/31/2020   CO2 29 07/27/2020   TSH  1.282 10/21/2019   INR 1.1 10/21/2019   HGBA1C 7.5 (H) 05/31/2020      Assessment / Plan: The patient is doing well clinically in regards to her right pleural effusion with no evidence of reaccumulation on today's chest x-ray.  We will see her again on a as needed basis for any surgical needs or at request of her primary or consultant physicians.      Medication Changes: No orders of the defined types were placed in this encounter.     John Giovanni, PA-C  01/28/2021 3:32 PM

## 2021-01-29 DIAGNOSIS — N186 End stage renal disease: Secondary | ICD-10-CM | POA: Diagnosis not present

## 2021-01-29 DIAGNOSIS — N2581 Secondary hyperparathyroidism of renal origin: Secondary | ICD-10-CM | POA: Diagnosis not present

## 2021-01-29 DIAGNOSIS — Z992 Dependence on renal dialysis: Secondary | ICD-10-CM | POA: Diagnosis not present

## 2021-01-29 DIAGNOSIS — D689 Coagulation defect, unspecified: Secondary | ICD-10-CM | POA: Diagnosis not present

## 2021-01-31 DIAGNOSIS — N186 End stage renal disease: Secondary | ICD-10-CM | POA: Diagnosis not present

## 2021-01-31 DIAGNOSIS — D689 Coagulation defect, unspecified: Secondary | ICD-10-CM | POA: Diagnosis not present

## 2021-01-31 DIAGNOSIS — N2581 Secondary hyperparathyroidism of renal origin: Secondary | ICD-10-CM | POA: Diagnosis not present

## 2021-01-31 DIAGNOSIS — Z992 Dependence on renal dialysis: Secondary | ICD-10-CM | POA: Diagnosis not present

## 2021-02-02 DIAGNOSIS — N186 End stage renal disease: Secondary | ICD-10-CM | POA: Diagnosis not present

## 2021-02-02 DIAGNOSIS — D689 Coagulation defect, unspecified: Secondary | ICD-10-CM | POA: Diagnosis not present

## 2021-02-02 DIAGNOSIS — Z992 Dependence on renal dialysis: Secondary | ICD-10-CM | POA: Diagnosis not present

## 2021-02-02 DIAGNOSIS — N2581 Secondary hyperparathyroidism of renal origin: Secondary | ICD-10-CM | POA: Diagnosis not present

## 2021-02-05 DIAGNOSIS — N186 End stage renal disease: Secondary | ICD-10-CM | POA: Diagnosis not present

## 2021-02-05 DIAGNOSIS — Z992 Dependence on renal dialysis: Secondary | ICD-10-CM | POA: Diagnosis not present

## 2021-02-05 DIAGNOSIS — E1029 Type 1 diabetes mellitus with other diabetic kidney complication: Secondary | ICD-10-CM | POA: Diagnosis not present

## 2021-02-05 DIAGNOSIS — N2581 Secondary hyperparathyroidism of renal origin: Secondary | ICD-10-CM | POA: Diagnosis not present

## 2021-02-05 DIAGNOSIS — D689 Coagulation defect, unspecified: Secondary | ICD-10-CM | POA: Diagnosis not present

## 2021-02-07 DIAGNOSIS — N186 End stage renal disease: Secondary | ICD-10-CM | POA: Diagnosis not present

## 2021-02-07 DIAGNOSIS — E1029 Type 1 diabetes mellitus with other diabetic kidney complication: Secondary | ICD-10-CM | POA: Diagnosis not present

## 2021-02-07 DIAGNOSIS — Z992 Dependence on renal dialysis: Secondary | ICD-10-CM | POA: Diagnosis not present

## 2021-02-07 DIAGNOSIS — D689 Coagulation defect, unspecified: Secondary | ICD-10-CM | POA: Diagnosis not present

## 2021-02-07 DIAGNOSIS — N2581 Secondary hyperparathyroidism of renal origin: Secondary | ICD-10-CM | POA: Diagnosis not present

## 2021-02-09 DIAGNOSIS — Z992 Dependence on renal dialysis: Secondary | ICD-10-CM | POA: Diagnosis not present

## 2021-02-09 DIAGNOSIS — N186 End stage renal disease: Secondary | ICD-10-CM | POA: Diagnosis not present

## 2021-02-09 DIAGNOSIS — D689 Coagulation defect, unspecified: Secondary | ICD-10-CM | POA: Diagnosis not present

## 2021-02-09 DIAGNOSIS — E1029 Type 1 diabetes mellitus with other diabetic kidney complication: Secondary | ICD-10-CM | POA: Diagnosis not present

## 2021-02-09 DIAGNOSIS — N2581 Secondary hyperparathyroidism of renal origin: Secondary | ICD-10-CM | POA: Diagnosis not present

## 2021-02-12 DIAGNOSIS — D689 Coagulation defect, unspecified: Secondary | ICD-10-CM | POA: Diagnosis not present

## 2021-02-12 DIAGNOSIS — N186 End stage renal disease: Secondary | ICD-10-CM | POA: Diagnosis not present

## 2021-02-12 DIAGNOSIS — N2581 Secondary hyperparathyroidism of renal origin: Secondary | ICD-10-CM | POA: Diagnosis not present

## 2021-02-12 DIAGNOSIS — Z992 Dependence on renal dialysis: Secondary | ICD-10-CM | POA: Diagnosis not present

## 2021-02-12 DIAGNOSIS — E1022 Type 1 diabetes mellitus with diabetic chronic kidney disease: Secondary | ICD-10-CM | POA: Diagnosis not present

## 2021-02-14 DIAGNOSIS — D689 Coagulation defect, unspecified: Secondary | ICD-10-CM | POA: Diagnosis not present

## 2021-02-14 DIAGNOSIS — Z992 Dependence on renal dialysis: Secondary | ICD-10-CM | POA: Diagnosis not present

## 2021-02-14 DIAGNOSIS — N186 End stage renal disease: Secondary | ICD-10-CM | POA: Diagnosis not present

## 2021-02-14 DIAGNOSIS — N2581 Secondary hyperparathyroidism of renal origin: Secondary | ICD-10-CM | POA: Diagnosis not present

## 2021-02-16 DIAGNOSIS — N186 End stage renal disease: Secondary | ICD-10-CM | POA: Diagnosis not present

## 2021-02-16 DIAGNOSIS — N2581 Secondary hyperparathyroidism of renal origin: Secondary | ICD-10-CM | POA: Diagnosis not present

## 2021-02-16 DIAGNOSIS — Z992 Dependence on renal dialysis: Secondary | ICD-10-CM | POA: Diagnosis not present

## 2021-02-16 DIAGNOSIS — D689 Coagulation defect, unspecified: Secondary | ICD-10-CM | POA: Diagnosis not present

## 2021-02-17 DIAGNOSIS — Z992 Dependence on renal dialysis: Secondary | ICD-10-CM | POA: Diagnosis not present

## 2021-02-17 DIAGNOSIS — E1029 Type 1 diabetes mellitus with other diabetic kidney complication: Secondary | ICD-10-CM | POA: Diagnosis not present

## 2021-02-17 DIAGNOSIS — N186 End stage renal disease: Secondary | ICD-10-CM | POA: Diagnosis not present

## 2021-02-19 DIAGNOSIS — Z992 Dependence on renal dialysis: Secondary | ICD-10-CM | POA: Diagnosis not present

## 2021-02-19 DIAGNOSIS — N186 End stage renal disease: Secondary | ICD-10-CM | POA: Diagnosis not present

## 2021-02-19 DIAGNOSIS — D689 Coagulation defect, unspecified: Secondary | ICD-10-CM | POA: Diagnosis not present

## 2021-02-19 DIAGNOSIS — N2581 Secondary hyperparathyroidism of renal origin: Secondary | ICD-10-CM | POA: Diagnosis not present

## 2021-02-21 DIAGNOSIS — N186 End stage renal disease: Secondary | ICD-10-CM | POA: Diagnosis not present

## 2021-02-21 DIAGNOSIS — D689 Coagulation defect, unspecified: Secondary | ICD-10-CM | POA: Diagnosis not present

## 2021-02-21 DIAGNOSIS — Z992 Dependence on renal dialysis: Secondary | ICD-10-CM | POA: Diagnosis not present

## 2021-02-21 DIAGNOSIS — N2581 Secondary hyperparathyroidism of renal origin: Secondary | ICD-10-CM | POA: Diagnosis not present

## 2021-02-25 DIAGNOSIS — D631 Anemia in chronic kidney disease: Secondary | ICD-10-CM | POA: Diagnosis not present

## 2021-02-25 DIAGNOSIS — Z992 Dependence on renal dialysis: Secondary | ICD-10-CM | POA: Diagnosis not present

## 2021-02-25 DIAGNOSIS — D689 Coagulation defect, unspecified: Secondary | ICD-10-CM | POA: Diagnosis not present

## 2021-02-25 DIAGNOSIS — R739 Hyperglycemia, unspecified: Secondary | ICD-10-CM | POA: Diagnosis not present

## 2021-02-25 DIAGNOSIS — N186 End stage renal disease: Secondary | ICD-10-CM | POA: Diagnosis not present

## 2021-02-25 DIAGNOSIS — N2581 Secondary hyperparathyroidism of renal origin: Secondary | ICD-10-CM | POA: Diagnosis not present

## 2021-02-28 DIAGNOSIS — D631 Anemia in chronic kidney disease: Secondary | ICD-10-CM | POA: Diagnosis not present

## 2021-02-28 DIAGNOSIS — N186 End stage renal disease: Secondary | ICD-10-CM | POA: Diagnosis not present

## 2021-02-28 DIAGNOSIS — D689 Coagulation defect, unspecified: Secondary | ICD-10-CM | POA: Diagnosis not present

## 2021-02-28 DIAGNOSIS — R739 Hyperglycemia, unspecified: Secondary | ICD-10-CM | POA: Diagnosis not present

## 2021-02-28 DIAGNOSIS — N2581 Secondary hyperparathyroidism of renal origin: Secondary | ICD-10-CM | POA: Diagnosis not present

## 2021-02-28 DIAGNOSIS — Z992 Dependence on renal dialysis: Secondary | ICD-10-CM | POA: Diagnosis not present

## 2021-03-02 DIAGNOSIS — D689 Coagulation defect, unspecified: Secondary | ICD-10-CM | POA: Diagnosis not present

## 2021-03-02 DIAGNOSIS — N186 End stage renal disease: Secondary | ICD-10-CM | POA: Diagnosis not present

## 2021-03-02 DIAGNOSIS — Z992 Dependence on renal dialysis: Secondary | ICD-10-CM | POA: Diagnosis not present

## 2021-03-02 DIAGNOSIS — D631 Anemia in chronic kidney disease: Secondary | ICD-10-CM | POA: Diagnosis not present

## 2021-03-02 DIAGNOSIS — R739 Hyperglycemia, unspecified: Secondary | ICD-10-CM | POA: Diagnosis not present

## 2021-03-02 DIAGNOSIS — N2581 Secondary hyperparathyroidism of renal origin: Secondary | ICD-10-CM | POA: Diagnosis not present

## 2021-03-05 DIAGNOSIS — N186 End stage renal disease: Secondary | ICD-10-CM | POA: Diagnosis not present

## 2021-03-05 DIAGNOSIS — D689 Coagulation defect, unspecified: Secondary | ICD-10-CM | POA: Diagnosis not present

## 2021-03-05 DIAGNOSIS — Z992 Dependence on renal dialysis: Secondary | ICD-10-CM | POA: Diagnosis not present

## 2021-03-05 DIAGNOSIS — N2581 Secondary hyperparathyroidism of renal origin: Secondary | ICD-10-CM | POA: Diagnosis not present

## 2021-03-05 DIAGNOSIS — E1029 Type 1 diabetes mellitus with other diabetic kidney complication: Secondary | ICD-10-CM | POA: Diagnosis not present

## 2021-03-07 DIAGNOSIS — N186 End stage renal disease: Secondary | ICD-10-CM | POA: Diagnosis not present

## 2021-03-07 DIAGNOSIS — N2581 Secondary hyperparathyroidism of renal origin: Secondary | ICD-10-CM | POA: Diagnosis not present

## 2021-03-07 DIAGNOSIS — Z992 Dependence on renal dialysis: Secondary | ICD-10-CM | POA: Diagnosis not present

## 2021-03-07 DIAGNOSIS — E1029 Type 1 diabetes mellitus with other diabetic kidney complication: Secondary | ICD-10-CM | POA: Diagnosis not present

## 2021-03-07 DIAGNOSIS — D689 Coagulation defect, unspecified: Secondary | ICD-10-CM | POA: Diagnosis not present

## 2021-03-08 ENCOUNTER — Other Ambulatory Visit: Payer: Self-pay

## 2021-03-08 ENCOUNTER — Encounter (HOSPITAL_COMMUNITY): Payer: Self-pay

## 2021-03-08 ENCOUNTER — Emergency Department (HOSPITAL_COMMUNITY): Payer: Medicare HMO

## 2021-03-08 ENCOUNTER — Emergency Department (HOSPITAL_COMMUNITY)
Admission: EM | Admit: 2021-03-08 | Discharge: 2021-03-08 | Disposition: A | Discharge status: Home / Self Care | Payer: Medicare HMO | Attending: Emergency Medicine | Admitting: Emergency Medicine

## 2021-03-08 DIAGNOSIS — E10649 Type 1 diabetes mellitus with hypoglycemia without coma: Secondary | ICD-10-CM | POA: Insufficient documentation

## 2021-03-08 DIAGNOSIS — Z992 Dependence on renal dialysis: Secondary | ICD-10-CM | POA: Diagnosis not present

## 2021-03-08 DIAGNOSIS — I132 Hypertensive heart and chronic kidney disease with heart failure and with stage 5 chronic kidney disease, or end stage renal disease: Secondary | ICD-10-CM | POA: Insufficient documentation

## 2021-03-08 DIAGNOSIS — I10 Essential (primary) hypertension: Secondary | ICD-10-CM | POA: Diagnosis not present

## 2021-03-08 DIAGNOSIS — I120 Hypertensive chronic kidney disease with stage 5 chronic kidney disease or end stage renal disease: Secondary | ICD-10-CM | POA: Diagnosis not present

## 2021-03-08 DIAGNOSIS — R0902 Hypoxemia: Secondary | ICD-10-CM | POA: Diagnosis not present

## 2021-03-08 DIAGNOSIS — E11649 Type 2 diabetes mellitus with hypoglycemia without coma: Secondary | ICD-10-CM | POA: Diagnosis not present

## 2021-03-08 DIAGNOSIS — Z79899 Other long term (current) drug therapy: Secondary | ICD-10-CM | POA: Insufficient documentation

## 2021-03-08 DIAGNOSIS — E10319 Type 1 diabetes mellitus with unspecified diabetic retinopathy without macular edema: Secondary | ICD-10-CM | POA: Insufficient documentation

## 2021-03-08 DIAGNOSIS — Z794 Long term (current) use of insulin: Secondary | ICD-10-CM | POA: Insufficient documentation

## 2021-03-08 DIAGNOSIS — I5032 Chronic diastolic (congestive) heart failure: Secondary | ICD-10-CM | POA: Diagnosis not present

## 2021-03-08 DIAGNOSIS — E1022 Type 1 diabetes mellitus with diabetic chronic kidney disease: Secondary | ICD-10-CM | POA: Insufficient documentation

## 2021-03-08 DIAGNOSIS — N186 End stage renal disease: Secondary | ICD-10-CM | POA: Diagnosis not present

## 2021-03-08 DIAGNOSIS — E785 Hyperlipidemia, unspecified: Secondary | ICD-10-CM | POA: Diagnosis not present

## 2021-03-08 DIAGNOSIS — E1059 Type 1 diabetes mellitus with other circulatory complications: Secondary | ICD-10-CM

## 2021-03-08 DIAGNOSIS — E162 Hypoglycemia, unspecified: Secondary | ICD-10-CM

## 2021-03-08 DIAGNOSIS — R404 Transient alteration of awareness: Secondary | ICD-10-CM | POA: Diagnosis not present

## 2021-03-08 DIAGNOSIS — R4182 Altered mental status, unspecified: Secondary | ICD-10-CM | POA: Diagnosis present

## 2021-03-08 DIAGNOSIS — E1051 Type 1 diabetes mellitus with diabetic peripheral angiopathy without gangrene: Secondary | ICD-10-CM | POA: Diagnosis not present

## 2021-03-08 DIAGNOSIS — E1069 Type 1 diabetes mellitus with other specified complication: Secondary | ICD-10-CM | POA: Diagnosis not present

## 2021-03-08 DIAGNOSIS — Z743 Need for continuous supervision: Secondary | ICD-10-CM | POA: Diagnosis not present

## 2021-03-08 DIAGNOSIS — R1111 Vomiting without nausea: Secondary | ICD-10-CM | POA: Diagnosis not present

## 2021-03-08 DIAGNOSIS — I517 Cardiomegaly: Secondary | ICD-10-CM | POA: Diagnosis not present

## 2021-03-08 LAB — COMPREHENSIVE METABOLIC PANEL
ALT: 18 U/L (ref 0–44)
AST: 18 U/L (ref 15–41)
Albumin: 4.1 g/dL (ref 3.5–5.0)
Alkaline Phosphatase: 104 U/L (ref 38–126)
Anion gap: 15 (ref 5–15)
BUN: 37 mg/dL — ABNORMAL HIGH (ref 6–20)
CO2: 31 mmol/L (ref 22–32)
Calcium: 9.4 mg/dL (ref 8.9–10.3)
Chloride: 92 mmol/L — ABNORMAL LOW (ref 98–111)
Creatinine, Ser: 7.8 mg/dL — ABNORMAL HIGH (ref 0.44–1.00)
GFR, Estimated: 6 mL/min — ABNORMAL LOW (ref >60)
Glucose, Bld: 42 mg/dL — CL (ref 70–99)
Potassium: 4.6 mmol/L (ref 3.5–5.1)
Sodium: 138 mmol/L (ref 135–145)
Total Bilirubin: 0.8 mg/dL (ref 0.3–1.2)
Total Protein: 8 g/dL (ref 6.5–8.1)

## 2021-03-08 LAB — TROPONIN I (HIGH SENSITIVITY): Troponin I (High Sensitivity): 21 ng/L — ABNORMAL HIGH (ref <18)

## 2021-03-08 LAB — CBG MONITORING, ED
Glucose-Capillary: 189 mg/dL — ABNORMAL HIGH (ref 70–99)
Glucose-Capillary: 30 mg/dL — CL (ref 70–99)
Glucose-Capillary: 139 mg/dL — ABNORMAL HIGH (ref 70–99)
Glucose-Capillary: 143 mg/dL — ABNORMAL HIGH (ref 70–99)
Glucose-Capillary: 176 mg/dL — ABNORMAL HIGH (ref 70–99)
Glucose-Capillary: 239 mg/dL — ABNORMAL HIGH (ref 70–99)

## 2021-03-08 LAB — CBC WITH DIFFERENTIAL/PLATELET
Abs Immature Granulocytes: 0.03 10*3/uL (ref 0.00–0.07)
Basophils Absolute: 0.1 10*3/uL (ref 0.0–0.1)
Basophils Relative: 1 %
Eosinophils Absolute: 0.6 10*3/uL — ABNORMAL HIGH (ref 0.0–0.5)
Eosinophils Relative: 8 %
HCT: 33.9 % — ABNORMAL LOW (ref 36.0–46.0)
Hemoglobin: 11.2 g/dL — ABNORMAL LOW (ref 12.0–15.0)
Immature Granulocytes: 0 %
Lymphocytes Relative: 20 %
Lymphs Abs: 1.4 10*3/uL (ref 0.7–4.0)
MCH: 31.1 pg (ref 26.0–34.0)
MCHC: 33 g/dL (ref 30.0–36.0)
MCV: 94.2 fL (ref 80.0–100.0)
Monocytes Absolute: 0.6 10*3/uL (ref 0.1–1.0)
Monocytes Relative: 8 %
Neutro Abs: 4.2 10*3/uL (ref 1.7–7.7)
Neutrophils Relative %: 63 %
Platelets: 287 10*3/uL (ref 150–400)
RBC: 3.6 MIL/uL — ABNORMAL LOW (ref 3.87–5.11)
RDW: 15.7 % — ABNORMAL HIGH (ref 11.5–15.5)
WBC: 6.8 10*3/uL (ref 4.0–10.5)
nRBC: 0 % (ref 0.0–0.2)

## 2021-03-08 LAB — MAGNESIUM: Magnesium: 2.5 mg/dL — ABNORMAL HIGH (ref 1.7–2.4)

## 2021-03-08 MED ORDER — GLUCOSE 40 % PO GEL
ORAL | Status: AC
  Filled 2021-03-08: qty 1

## 2021-03-08 MED ORDER — SODIUM CHLORIDE 0.9% FLUSH: 3.0000 mL | Freq: Two times a day (BID) | INTRAVENOUS | Status: DC

## 2021-03-08 MED ORDER — DEXTROSE 50 % IV SOLN: 1.0000 | Freq: Once | INTRAVENOUS | Status: DC

## 2021-03-08 MED ORDER — SODIUM CHLORIDE 0.9 % IV SOLN
250.0000 mL | INTRAVENOUS | Status: DC | PRN
  Administered 2021-03-08 (×2): 250 mL via INTRAVENOUS

## 2021-03-08 MED ORDER — DEXTROSE 50 % IV SOLN
INTRAVENOUS | Status: AC
  Filled 2021-03-08: qty 50

## 2021-03-08 MED ORDER — SODIUM CHLORIDE 0.9 % IV BOLUS
500.0000 mL | Freq: Once | INTRAVENOUS | Status: AC
  Administered 2021-03-08: 500 mL via INTRAVENOUS

## 2021-03-08 MED ORDER — SODIUM CHLORIDE 0.9% FLUSH: 3.0000 mL | INTRAVENOUS | Status: DC | PRN

## 2021-03-08 NOTE — ED Notes (Addendum)
Patient states she has already called her ride and is leaving.  Asked pt to stay to speak w/MD. Dr. Joya Gaskins to bedside, spoke with patient regarding risks/benefits and necessary steps at home.  Pt states she understands and will set up her monitoring unit and will call her endocrinologist. Pt left without receiving her discharge instructions.

## 2021-03-08 NOTE — ED Triage Notes (Signed)
At home with daughter goes to check on mother noticed she was lethargic d/c insulin pump and attempted to feed pt. Because this happens frequently. 15 gm of D10 given through an IV which is now not in.  Sugar was initially  22 D10 brought up to 181, currently here it is 30

## 2021-03-08 NOTE — ED Provider Notes (Addendum)
[facility] EMERGENCY DEPARTMENT Provider Note   CSN: [id number] Arrival date & time: 03/08/21  1743     History Chief Complaint  Patient presents with   Hypoglycemia    [name] is a 45 y.o. female.  Further history obtained later in the ED course is that she was not wearing her Dexcom.  However, her insulin pump was continuously running.  Therefore, she was receiving insulin without appropriate titration according to her blood sugars.  The history is provided by the patient. The history is limited by the condition of the patient.  Hypoglycemia Initial blood sugar:  22 Blood sugar after intervention:  181 then fell to 30 Severity:  Severe Onset quality:  Sudden Duration: just prior to arrival. Timing:  Constant Progression:  Waxing and waning Chronicity:  Recurrent Current diabetic treatments: has an insulin pump which the daughter shut off. Context comment:  ESRD, no known contributor to hypoglycemia but frequently experiences low blood sugars Relieved by:  IV glucose Associated symptoms: altered mental status   Associated symptoms: no seizures, no shortness of breath and no vomiting       Past Medical History:  Diagnosis Date   Anemia    Arthritis    Ascites 03/28/2020   CHF (congestive heart failure) (HCC)    Diabetes mellitus    Diabetic retinopathy ([place])    Hx of laser Rx's   DM type 1 (diabetes mellitus, type 1) ([place]) 09/12/2014   Age of onset for DM type 1 was age 65.      Enlarged thyroid gland    ESRD on hemodialysis ([place])    ESRD due to DM type I, age of onset DM 1 was age 60.  Went on dialysis in March 2012.  Gets HD now at [facility] on a TTS schedule.     ESRD on hemodialysis ([place]) 09/12/2014   ESRD due to DM type 1.  Started HD in 2012 at [facility].  Gets HD there on a TTS schedule.     GERD (gastroesophageal reflux disease)    History of blood transfusion    Hyperlipidemia    Hypertension    Peripheral vascular disease  ([employer])    Pneumonia     Patient Active Problem List   Diagnosis Date Noted   Visit for wound check 07/26/2020   Hypoxia    Pleural effusion    Difficulty breathing 05/30/2020   Exudative pleural effusion    Ascites 03/28/2020   Hypothermia    Bacteremia 02/11/2020   Hypoglycemia 02/08/2020   Hemorrhage from arteriovenous dialysis graft ([place]) 10/20/2019   Hyperkalemia 04/15/2018   Nausea 04/15/2018   Cerebral infarction due to thrombosis of basilar artery ([place]) 03/11/2017   Left hand pain 08/25/2016   Hyperparathyroidism, secondary renal (HCC)    Benign essential HTN    Pneumonia 09/12/2014   CAP (community acquired pneumonia) 09/12/2014   ESRD on hemodialysis ([place]) 09/12/2014   Diabetes mellitus type I ([place]) 09/12/2014   History of diabetic retinopathy 09/12/2014   HCAP (healthcare-associated pneumonia) 09/11/2014   Cerebral infarction due to unspecified mechanism 05/22/2014   Hypertensive emergency 05/22/2014   Essential hypertension, benign 05/22/2014   DM (diabetes mellitus) ([place]) 05/22/2014   HLD (hyperlipidemia) 05/22/2014   Cerebral infarction ([place]) 03/29/2014   Facial droop 03/29/2014   Mechanical complication of other vascular device, implant, and graft [id number]   Other complications due to renal dialysis device, implant, and graft 12/08/2012   ESRD on hemodialysis ([place]) 04/18/2011  Past Surgical History:  Procedure Laterality Date   A/V FISTULAGRAM Right 10/31/2020   Procedure: A/V FISTULAGRAM;  Surgeon: Cherre Robins, MD;  Location: [facility];  Service: Cardiovascular;  Laterality: Right;   ARTERIOVENOUS GRAFT PLACEMENT     ARTERY REPAIR Left 12/10/2012   Procedure: BRACHIAL ARTERY REPAIR;  Surgeon: Angelia Mould, MD;  Location: [facility];  Service: Vascular;  Laterality: Left;  Exploration Left Brachial Artery for AVF   AV FISTULA PLACEMENT Right 01/11/2018   Procedure: INSERTION OF ARTERIOVENOUS (AV) GORE-TEX GRAFT  UPPER ARM;  Surgeon:  Angelia Mould, MD;  Location: [facility];  Service: Vascular;  Laterality: Right;   [place] Right 09/28/2017   Procedure: FIRST STAGE BASILIC VEIN TRANSPOSITION;  Surgeon: Angelia Mould, MD;  Location: [facility];  Service: Vascular;  Laterality: Right;   CESAREAN SECTION     CHEST TUBE INSERTION Right 06/01/2020   Procedure: CHEST TUBE INSERTION;  Surgeon: Garner Nash, DO;  Location: [facility];  Service: Pulmonary;  Laterality: Right;   CHEST TUBE INSERTION Right 07/04/2020   Procedure: INSERTION PLEURAL DRAINAGE CATHETER;  Surgeon: Melrose Nakayama, MD;  Location: [facility];  Service: Thoracic;  Laterality: Right;   EYE SURGERY     [place]   EYE SURGERY Left    IR THORACENTESIS ASP PLEURAL SPACE W/IMG GUIDE  05/31/2020   IR THORACENTESIS ASP PLEURAL SPACE W/IMG GUIDE  06/22/2020   REVISION OF ARTERIOVENOUS GORETEX GRAFT Left 08/01/2016   Procedure: REVISION OF ARTERIOVENOUS GORETEX GRAFT;  Surgeon: Angelia Mould, MD;  Location: [facility];  Service: Vascular;  Laterality: Left;   REVISION OF ARTERIOVENOUS GORETEX GRAFT Right 10/21/2019   Procedure: REVISION OF ARTERIOVENOUS GORETEX GRAFT ARM;  Surgeon: Rosetta Posner, MD;  Location: [facility];  Service: Vascular;  Laterality: Right;   RIGHT HEART CATH N/A 08/01/2020   Procedure: RIGHT HEART CATH;  Surgeon: Jolaine Artist, MD;  Location: [facility];  Service: Cardiovascular;  Laterality: N/A;   SHUNTOGRAM Left 11/30/2013   Procedure: Earney Mallet;  Surgeon: Serafina Mitchell, MD;  Location: [facility] CATH LAB;  Service: Cardiovascular;  Laterality: Left;   TEE WITHOUT CARDIOVERSION N/A 02/13/2020   Procedure: TRANSESOPHAGEAL ECHOCARDIOGRAM (TEE);  Surgeon: Josue Hector, MD;  Location: [facility] ENDOSCOPY;  Service: Cardiovascular;  Laterality: N/A;     OB History     Gravida  0   Para  0   Term  0   Preterm  0   AB  0   Living         SAB  0   IAB  0   Ectopic  0   Multiple      Live Births               Family History  Problem Relation Age of Onset   Cancer Mother        breast   Hypertension Father     Social History   Tobacco Use   Smoking status: Never   Smokeless tobacco: Never  Vaping Use   Vaping Use: Never used  Substance Use Topics   Alcohol use: No    Alcohol/week: 0.0 standard drinks   Drug use: No    Home Medications Prior to Admission medications   Medication Sig Start Date End Date Taking? Authorizing Provider  amLODipine (NORVASC) 10 MG tablet Take 10 mg by mouth at bedtime.    [provider]  calcium acetate (PHOSLO) 667 MG  capsule Take 667 mg by mouth See admin instructions. Take 1 capsule (667 mg) by mouth with each meal & each snack 07/31/20   [provider]  GLUCAGON EMERGENCY 1 MG injection Inject 1 mg into the muscle once as needed (for severe low blood sugar).  11/22/12   [provider]  Insulin Human (INSULIN PUMP) SOLN Inject into the skin continuous. Novolog    [provider]  labetalol (NORMODYNE) 300 MG tablet Take 300 mg by mouth 2 (two) times daily.    [provider]  losartan (COZAAR) 50 MG tablet Take 50 mg by mouth at bedtime. 05/11/20   [provider]  NOVOLOG 100 UNIT/ML injection Inject into the skin See admin instructions. Via Insulin pump 10/10/20   [provider]  rosuvastatin (CRESTOR) 20 MG tablet Take 20 mg by mouth at bedtime.    [provider]    Allergies    Pollen extract  Review of Systems   Review of Systems  Unable to perform ROS: Acuity of condition (Initially ROS could not be performed, but later in her ED course, I was able to speak with her.  She had no other symptoms or concerns.)  Constitutional:  Negative for chills and fever.  HENT:  Negative for ear pain and sore throat.   Eyes:  Negative for pain and visual disturbance.  Respiratory:  Negative for cough and shortness of breath.   Cardiovascular:  Negative for chest pain and  palpitations.  Gastrointestinal:  Negative for abdominal pain and vomiting.  Genitourinary:  Negative for dysuria and hematuria.  Musculoskeletal:  Negative for arthralgias and back pain.  Skin:  Negative for color change and rash.  Neurological:  Negative for seizures and syncope.  All other systems reviewed and are negative.  Physical Exam Updated Vital Signs LMP 12/21/2017   Physical Exam Vitals and nursing note reviewed.  Constitutional:      Appearance: She is well-developed. She is ill-appearing and diaphoretic.     Comments: At discharge, she was feeling much better.  She was alert and awake. Normal cognition. She was no longer ill-appearing or diaphoretic.  HENT:     Head: Normocephalic and atraumatic.  Cardiovascular:     Rate and Rhythm: Normal rate and regular rhythm.     Heart sounds: Normal heart sounds.  Pulmonary:     Effort: Pulmonary effort is normal. No tachypnea.     Breath sounds: Normal breath sounds.  Abdominal:     Palpations: Abdomen is soft.     Tenderness: There is no abdominal tenderness.  Musculoskeletal:        General: No swelling or deformity.     Right lower leg: No edema.     Left lower leg: No edema.  Skin:    General: Skin is warm.     Comments: cool  Neurological:     General: No focal deficit present.     Mental Status: She is alert and oriented to person, place, and time.     GCS: GCS eye subscore is 4. GCS verbal subscore is 4. GCS motor subscore is 6.  Psychiatric:        Mood and Affect: Mood normal.        Behavior: Behavior normal.    ED Results / Procedures / Treatments   Labs (all labs ordered are listed, but only abnormal results are displayed) Labs Reviewed  COMPREHENSIVE METABOLIC PANEL - Abnormal; Notable for the following components:  Result Value   Chloride 92 (*)    Glucose, Bld 42 (*)    BUN 37 (*)    Creatinine, Ser 7.80 (*)    GFR, Estimated 6 (*)    All other components within normal limits  CBC WITH  DIFFERENTIAL/PLATELET - Abnormal; Notable for the following components:   RBC 3.60 (*)    Hemoglobin 11.2 (*)    HCT 33.9 (*)    RDW 15.7 (*)    Eosinophils Absolute 0.6 (*)    All other components within normal limits  MAGNESIUM - Abnormal; Notable for the following components:   Magnesium 2.5 (*)    All other components within normal limits  CBG MONITORING, ED - Abnormal; Notable for the following components:   Glucose-Capillary 30 (*)    All other components within normal limits  CBG MONITORING, ED - Abnormal; Notable for the following components:   Glucose-Capillary 189 (*)    All other components within normal limits  CBG MONITORING, ED - Abnormal; Notable for the following components:   Glucose-Capillary 139 (*)    All other components within normal limits  CBG MONITORING, ED - Abnormal; Notable for the following components:   Glucose-Capillary 143 (*)    All other components within normal limits  CBG MONITORING, ED - Abnormal; Notable for the following components:   Glucose-Capillary 176 (*)    All other components within normal limits  CBG MONITORING, ED - Abnormal; Notable for the following components:   Glucose-Capillary 239 (*)    All other components within normal limits  TROPONIN I (HIGH SENSITIVITY) - Abnormal; Notable for the following components:   Troponin I (High Sensitivity) 21 (*)    All other components within normal limits  RESP PANEL BY RT-PCR (FLU A&B, COVID) [id number]  TROPONIN I (HIGH SENSITIVITY)    EKG EKG Interpretation  Date/Time:  Friday March 08 2021 18:05:47 EDT Ventricular Rate:  69 PR Interval:  138 QRS Duration: 82 QT Interval:  484 QTC Calculation: 519 R Axis:   71 Text Interpretation: Sinus rhythm Consider left ventricular hypertrophy Abnormal T, consider ischemia, diffuse leads T wave inversions new when compared to prior Prolonged QT interval Confirmed by Lorre Munroe ([id number]) on 03/08/2021 6:07:31 PM  Radiology DG Chest Port 1  View  Result Date: 03/08/2021 CLINICAL DATA:  Evaluate possible pneumonia. EXAM: PORTABLE CHEST 1 VIEW COMPARISON:  01/28/2021 FINDINGS: Mild cardiac enlargement. Suggestion of central vascular congestion, new since prior study. No edema or consolidation. No pleural effusions. No pneumothorax. Mediastinal contours appear intact. Surgical clips in the left axilla. Vascular calcifications. IMPRESSION: Cardiac enlargement with mild central vascular congestion. No edema or consolidation. Electronically Signed   By: Lucienne Capers M.D.   On: 03/08/2021 18:46    Procedures .Critical Care  Date/Time: 03/08/2021 10:54 PM Performed by: Arnaldo Natal, MD Authorized by: Arnaldo Natal, MD   Critical care provider statement:    Critical care time (minutes):  30   Critical care time was exclusive of:  Separately billable procedures and treating other patients and teaching time   Critical care was necessary to treat or prevent imminent or life-threatening deterioration of the following conditions:  Endocrine crisis   Critical care was time spent personally by me on the following activities:  Blood draw for specimens, development of treatment plan with patient or surrogate, discussions with primary provider, evaluation of patient's response to treatment, examination of patient, obtaining history from patient or surrogate, ordering and performing treatments and interventions, ordering and  review of laboratory studies, ordering and review of radiographic studies, pulse oximetry, re-evaluation of patient's condition and review of old charts   I assumed direction of critical care for this patient from another provider in my specialty: no     Medications Ordered in ED Medications  dextrose 50 % solution (has no administration in time range)  sodium chloride flush (NS) 0.9 % injection 3 mL (has no administration in time range)  sodium chloride flush (NS) 0.9 % injection 3 mL (3 mLs Intravenous Not Given 03/08/21  1809)  0.9 %  sodium chloride infusion (250 mLs Intravenous New Bag/Given 03/08/21 2059)  sodium chloride 0.9 % bolus 500 mL (0 mLs Intravenous Stopped 03/08/21 2058)    ED Course  I have reviewed the triage vital signs and the nursing notes.  Pertinent labs & imaging results that were available during my care of the patient were reviewed by me and considered in my medical decision making (see chart for details).    MDM Rules/Calculators/A&P                           [name] presents with hypoglycemia.  This is likely secondary to her not monitoring her blood sugars.  She was resuscitated with D50 and oral glucose.  Blood sugar stabilized in the emergency department.  She agrees to go home and place her Dexcom.  She also agrees to follow with endocrinology.  No evidence of infection, ACS.   Final Clinical Impression(s) / ED Diagnoses Final diagnoses:  Hypoglycemia  ESRD on hemodialysis ([place])  Type 1 diabetes mellitus with other circulatory complication [facility])    Rx / DC Orders ED Discharge Orders     None        Arnaldo Natal, MD 03/08/21 2253    Arnaldo Natal, MD 03/08/21 [phone]

## 2021-03-08 NOTE — ED Notes (Signed)
Pt is now responding to questions and answering appropriately

## 2021-03-09 DIAGNOSIS — Z992 Dependence on renal dialysis: Secondary | ICD-10-CM | POA: Diagnosis not present

## 2021-03-09 DIAGNOSIS — E1029 Type 1 diabetes mellitus with other diabetic kidney complication: Secondary | ICD-10-CM | POA: Diagnosis not present

## 2021-03-09 DIAGNOSIS — N186 End stage renal disease: Secondary | ICD-10-CM | POA: Diagnosis not present

## 2021-03-09 DIAGNOSIS — D689 Coagulation defect, unspecified: Secondary | ICD-10-CM | POA: Diagnosis not present

## 2021-03-09 DIAGNOSIS — N2581 Secondary hyperparathyroidism of renal origin: Secondary | ICD-10-CM | POA: Diagnosis not present

## 2021-03-11 DIAGNOSIS — I10 Essential (primary) hypertension: Secondary | ICD-10-CM | POA: Diagnosis not present

## 2021-03-11 DIAGNOSIS — E782 Mixed hyperlipidemia: Secondary | ICD-10-CM | POA: Diagnosis not present

## 2021-03-11 DIAGNOSIS — I119 Hypertensive heart disease without heart failure: Secondary | ICD-10-CM | POA: Diagnosis not present

## 2021-03-11 DIAGNOSIS — D638 Anemia in other chronic diseases classified elsewhere: Secondary | ICD-10-CM | POA: Diagnosis not present

## 2021-03-11 DIAGNOSIS — Z0001 Encounter for general adult medical examination with abnormal findings: Secondary | ICD-10-CM | POA: Diagnosis not present

## 2021-03-11 DIAGNOSIS — E1165 Type 2 diabetes mellitus with hyperglycemia: Secondary | ICD-10-CM | POA: Diagnosis not present

## 2021-03-12 DIAGNOSIS — R739 Hyperglycemia, unspecified: Secondary | ICD-10-CM | POA: Diagnosis not present

## 2021-03-12 DIAGNOSIS — Z992 Dependence on renal dialysis: Secondary | ICD-10-CM | POA: Diagnosis not present

## 2021-03-12 DIAGNOSIS — D631 Anemia in chronic kidney disease: Secondary | ICD-10-CM | POA: Diagnosis not present

## 2021-03-12 DIAGNOSIS — N2581 Secondary hyperparathyroidism of renal origin: Secondary | ICD-10-CM | POA: Diagnosis not present

## 2021-03-12 DIAGNOSIS — N186 End stage renal disease: Secondary | ICD-10-CM | POA: Diagnosis not present

## 2021-03-14 DIAGNOSIS — D631 Anemia in chronic kidney disease: Secondary | ICD-10-CM | POA: Diagnosis not present

## 2021-03-14 DIAGNOSIS — R739 Hyperglycemia, unspecified: Secondary | ICD-10-CM | POA: Diagnosis not present

## 2021-03-14 DIAGNOSIS — Z992 Dependence on renal dialysis: Secondary | ICD-10-CM | POA: Diagnosis not present

## 2021-03-14 DIAGNOSIS — E1022 Type 1 diabetes mellitus with diabetic chronic kidney disease: Secondary | ICD-10-CM | POA: Diagnosis not present

## 2021-03-14 DIAGNOSIS — N2581 Secondary hyperparathyroidism of renal origin: Secondary | ICD-10-CM | POA: Diagnosis not present

## 2021-03-14 DIAGNOSIS — N186 End stage renal disease: Secondary | ICD-10-CM | POA: Diagnosis not present

## 2021-03-16 DIAGNOSIS — N2581 Secondary hyperparathyroidism of renal origin: Secondary | ICD-10-CM | POA: Diagnosis not present

## 2021-03-16 DIAGNOSIS — R739 Hyperglycemia, unspecified: Secondary | ICD-10-CM | POA: Diagnosis not present

## 2021-03-16 DIAGNOSIS — Z992 Dependence on renal dialysis: Secondary | ICD-10-CM | POA: Diagnosis not present

## 2021-03-16 DIAGNOSIS — D631 Anemia in chronic kidney disease: Secondary | ICD-10-CM | POA: Diagnosis not present

## 2021-03-16 DIAGNOSIS — N186 End stage renal disease: Secondary | ICD-10-CM | POA: Diagnosis not present

## 2021-03-19 DIAGNOSIS — Z992 Dependence on renal dialysis: Secondary | ICD-10-CM | POA: Diagnosis not present

## 2021-03-19 DIAGNOSIS — D689 Coagulation defect, unspecified: Secondary | ICD-10-CM | POA: Diagnosis not present

## 2021-03-19 DIAGNOSIS — N186 End stage renal disease: Secondary | ICD-10-CM | POA: Diagnosis not present

## 2021-03-19 DIAGNOSIS — N2581 Secondary hyperparathyroidism of renal origin: Secondary | ICD-10-CM | POA: Diagnosis not present

## 2021-03-20 DIAGNOSIS — N186 End stage renal disease: Secondary | ICD-10-CM | POA: Diagnosis not present

## 2021-03-20 DIAGNOSIS — E1029 Type 1 diabetes mellitus with other diabetic kidney complication: Secondary | ICD-10-CM | POA: Diagnosis not present

## 2021-03-20 DIAGNOSIS — Z992 Dependence on renal dialysis: Secondary | ICD-10-CM | POA: Diagnosis not present

## 2021-03-21 DIAGNOSIS — Z992 Dependence on renal dialysis: Secondary | ICD-10-CM | POA: Diagnosis not present

## 2021-03-21 DIAGNOSIS — N186 End stage renal disease: Secondary | ICD-10-CM | POA: Diagnosis not present

## 2021-03-21 DIAGNOSIS — D689 Coagulation defect, unspecified: Secondary | ICD-10-CM | POA: Diagnosis not present

## 2021-03-21 DIAGNOSIS — N2581 Secondary hyperparathyroidism of renal origin: Secondary | ICD-10-CM | POA: Diagnosis not present

## 2021-03-23 DIAGNOSIS — N186 End stage renal disease: Secondary | ICD-10-CM | POA: Diagnosis not present

## 2021-03-23 DIAGNOSIS — N2581 Secondary hyperparathyroidism of renal origin: Secondary | ICD-10-CM | POA: Diagnosis not present

## 2021-03-23 DIAGNOSIS — Z992 Dependence on renal dialysis: Secondary | ICD-10-CM | POA: Diagnosis not present

## 2021-03-23 DIAGNOSIS — D689 Coagulation defect, unspecified: Secondary | ICD-10-CM | POA: Diagnosis not present

## 2021-03-26 DIAGNOSIS — D689 Coagulation defect, unspecified: Secondary | ICD-10-CM | POA: Diagnosis not present

## 2021-03-26 DIAGNOSIS — Z992 Dependence on renal dialysis: Secondary | ICD-10-CM | POA: Diagnosis not present

## 2021-03-26 DIAGNOSIS — N186 End stage renal disease: Secondary | ICD-10-CM | POA: Diagnosis not present

## 2021-03-26 DIAGNOSIS — N2581 Secondary hyperparathyroidism of renal origin: Secondary | ICD-10-CM | POA: Diagnosis not present

## 2021-03-28 DIAGNOSIS — D689 Coagulation defect, unspecified: Secondary | ICD-10-CM | POA: Diagnosis not present

## 2021-03-28 DIAGNOSIS — N186 End stage renal disease: Secondary | ICD-10-CM | POA: Diagnosis not present

## 2021-03-28 DIAGNOSIS — N2581 Secondary hyperparathyroidism of renal origin: Secondary | ICD-10-CM | POA: Diagnosis not present

## 2021-03-28 DIAGNOSIS — Z992 Dependence on renal dialysis: Secondary | ICD-10-CM | POA: Diagnosis not present

## 2021-03-30 DIAGNOSIS — D689 Coagulation defect, unspecified: Secondary | ICD-10-CM | POA: Diagnosis not present

## 2021-03-30 DIAGNOSIS — N2581 Secondary hyperparathyroidism of renal origin: Secondary | ICD-10-CM | POA: Diagnosis not present

## 2021-03-30 DIAGNOSIS — Z992 Dependence on renal dialysis: Secondary | ICD-10-CM | POA: Diagnosis not present

## 2021-03-30 DIAGNOSIS — N186 End stage renal disease: Secondary | ICD-10-CM | POA: Diagnosis not present

## 2021-04-02 DIAGNOSIS — N186 End stage renal disease: Secondary | ICD-10-CM | POA: Diagnosis not present

## 2021-04-02 DIAGNOSIS — Z992 Dependence on renal dialysis: Secondary | ICD-10-CM | POA: Diagnosis not present

## 2021-04-02 DIAGNOSIS — N2581 Secondary hyperparathyroidism of renal origin: Secondary | ICD-10-CM | POA: Diagnosis not present

## 2021-04-02 DIAGNOSIS — D689 Coagulation defect, unspecified: Secondary | ICD-10-CM | POA: Diagnosis not present

## 2021-04-03 DIAGNOSIS — N186 End stage renal disease: Secondary | ICD-10-CM | POA: Diagnosis not present

## 2021-04-03 DIAGNOSIS — Z9641 Presence of insulin pump (external) (internal): Secondary | ICD-10-CM | POA: Diagnosis not present

## 2021-04-03 DIAGNOSIS — Z992 Dependence on renal dialysis: Secondary | ICD-10-CM | POA: Diagnosis not present

## 2021-04-03 DIAGNOSIS — E103593 Type 1 diabetes mellitus with proliferative diabetic retinopathy without macular edema, bilateral: Secondary | ICD-10-CM | POA: Diagnosis not present

## 2021-04-03 DIAGNOSIS — E1022 Type 1 diabetes mellitus with diabetic chronic kidney disease: Secondary | ICD-10-CM | POA: Diagnosis not present

## 2021-04-04 DIAGNOSIS — N2581 Secondary hyperparathyroidism of renal origin: Secondary | ICD-10-CM | POA: Diagnosis not present

## 2021-04-04 DIAGNOSIS — N186 End stage renal disease: Secondary | ICD-10-CM | POA: Diagnosis not present

## 2021-04-04 DIAGNOSIS — Z992 Dependence on renal dialysis: Secondary | ICD-10-CM | POA: Diagnosis not present

## 2021-04-04 DIAGNOSIS — D689 Coagulation defect, unspecified: Secondary | ICD-10-CM | POA: Diagnosis not present

## 2021-04-06 DIAGNOSIS — N186 End stage renal disease: Secondary | ICD-10-CM | POA: Diagnosis not present

## 2021-04-06 DIAGNOSIS — N2581 Secondary hyperparathyroidism of renal origin: Secondary | ICD-10-CM | POA: Diagnosis not present

## 2021-04-06 DIAGNOSIS — D689 Coagulation defect, unspecified: Secondary | ICD-10-CM | POA: Diagnosis not present

## 2021-04-06 DIAGNOSIS — Z992 Dependence on renal dialysis: Secondary | ICD-10-CM | POA: Diagnosis not present

## 2021-04-09 DIAGNOSIS — E1029 Type 1 diabetes mellitus with other diabetic kidney complication: Secondary | ICD-10-CM | POA: Diagnosis not present

## 2021-04-09 DIAGNOSIS — N2581 Secondary hyperparathyroidism of renal origin: Secondary | ICD-10-CM | POA: Diagnosis not present

## 2021-04-09 DIAGNOSIS — D689 Coagulation defect, unspecified: Secondary | ICD-10-CM | POA: Diagnosis not present

## 2021-04-09 DIAGNOSIS — Z992 Dependence on renal dialysis: Secondary | ICD-10-CM | POA: Diagnosis not present

## 2021-04-09 DIAGNOSIS — N186 End stage renal disease: Secondary | ICD-10-CM | POA: Diagnosis not present

## 2021-04-11 DIAGNOSIS — D689 Coagulation defect, unspecified: Secondary | ICD-10-CM | POA: Diagnosis not present

## 2021-04-11 DIAGNOSIS — E1029 Type 1 diabetes mellitus with other diabetic kidney complication: Secondary | ICD-10-CM | POA: Diagnosis not present

## 2021-04-11 DIAGNOSIS — Z992 Dependence on renal dialysis: Secondary | ICD-10-CM | POA: Diagnosis not present

## 2021-04-11 DIAGNOSIS — N2581 Secondary hyperparathyroidism of renal origin: Secondary | ICD-10-CM | POA: Diagnosis not present

## 2021-04-11 DIAGNOSIS — N186 End stage renal disease: Secondary | ICD-10-CM | POA: Diagnosis not present

## 2021-04-13 DIAGNOSIS — N2581 Secondary hyperparathyroidism of renal origin: Secondary | ICD-10-CM | POA: Diagnosis not present

## 2021-04-13 DIAGNOSIS — E1029 Type 1 diabetes mellitus with other diabetic kidney complication: Secondary | ICD-10-CM | POA: Diagnosis not present

## 2021-04-13 DIAGNOSIS — Z992 Dependence on renal dialysis: Secondary | ICD-10-CM | POA: Diagnosis not present

## 2021-04-13 DIAGNOSIS — N186 End stage renal disease: Secondary | ICD-10-CM | POA: Diagnosis not present

## 2021-04-13 DIAGNOSIS — D689 Coagulation defect, unspecified: Secondary | ICD-10-CM | POA: Diagnosis not present

## 2021-04-16 DIAGNOSIS — Z23 Encounter for immunization: Secondary | ICD-10-CM | POA: Diagnosis not present

## 2021-04-16 DIAGNOSIS — N2581 Secondary hyperparathyroidism of renal origin: Secondary | ICD-10-CM | POA: Diagnosis not present

## 2021-04-16 DIAGNOSIS — Z992 Dependence on renal dialysis: Secondary | ICD-10-CM | POA: Diagnosis not present

## 2021-04-16 DIAGNOSIS — N186 End stage renal disease: Secondary | ICD-10-CM | POA: Diagnosis not present

## 2021-04-16 DIAGNOSIS — D689 Coagulation defect, unspecified: Secondary | ICD-10-CM | POA: Diagnosis not present

## 2021-04-17 DIAGNOSIS — E1022 Type 1 diabetes mellitus with diabetic chronic kidney disease: Secondary | ICD-10-CM | POA: Diagnosis not present

## 2021-04-18 DIAGNOSIS — D689 Coagulation defect, unspecified: Secondary | ICD-10-CM | POA: Diagnosis not present

## 2021-04-18 DIAGNOSIS — Z23 Encounter for immunization: Secondary | ICD-10-CM | POA: Diagnosis not present

## 2021-04-18 DIAGNOSIS — N186 End stage renal disease: Secondary | ICD-10-CM | POA: Diagnosis not present

## 2021-04-18 DIAGNOSIS — N2581 Secondary hyperparathyroidism of renal origin: Secondary | ICD-10-CM | POA: Diagnosis not present

## 2021-04-18 DIAGNOSIS — Z992 Dependence on renal dialysis: Secondary | ICD-10-CM | POA: Diagnosis not present

## 2021-04-19 DIAGNOSIS — N186 End stage renal disease: Secondary | ICD-10-CM | POA: Diagnosis not present

## 2021-04-19 DIAGNOSIS — E1029 Type 1 diabetes mellitus with other diabetic kidney complication: Secondary | ICD-10-CM | POA: Diagnosis not present

## 2021-04-19 DIAGNOSIS — Z992 Dependence on renal dialysis: Secondary | ICD-10-CM | POA: Diagnosis not present

## 2021-04-20 DIAGNOSIS — Z992 Dependence on renal dialysis: Secondary | ICD-10-CM | POA: Diagnosis not present

## 2021-04-20 DIAGNOSIS — N186 End stage renal disease: Secondary | ICD-10-CM | POA: Diagnosis not present

## 2021-04-20 DIAGNOSIS — N2581 Secondary hyperparathyroidism of renal origin: Secondary | ICD-10-CM | POA: Diagnosis not present

## 2021-04-20 DIAGNOSIS — D689 Coagulation defect, unspecified: Secondary | ICD-10-CM | POA: Diagnosis not present

## 2021-04-23 DIAGNOSIS — D689 Coagulation defect, unspecified: Secondary | ICD-10-CM | POA: Diagnosis not present

## 2021-04-23 DIAGNOSIS — N2581 Secondary hyperparathyroidism of renal origin: Secondary | ICD-10-CM | POA: Diagnosis not present

## 2021-04-23 DIAGNOSIS — N186 End stage renal disease: Secondary | ICD-10-CM | POA: Diagnosis not present

## 2021-04-23 DIAGNOSIS — Z992 Dependence on renal dialysis: Secondary | ICD-10-CM | POA: Diagnosis not present

## 2021-04-25 DIAGNOSIS — N186 End stage renal disease: Secondary | ICD-10-CM | POA: Diagnosis not present

## 2021-04-25 DIAGNOSIS — Z992 Dependence on renal dialysis: Secondary | ICD-10-CM | POA: Diagnosis not present

## 2021-04-25 DIAGNOSIS — D689 Coagulation defect, unspecified: Secondary | ICD-10-CM | POA: Diagnosis not present

## 2021-04-25 DIAGNOSIS — N2581 Secondary hyperparathyroidism of renal origin: Secondary | ICD-10-CM | POA: Diagnosis not present

## 2021-04-27 DIAGNOSIS — N186 End stage renal disease: Secondary | ICD-10-CM | POA: Diagnosis not present

## 2021-04-27 DIAGNOSIS — D689 Coagulation defect, unspecified: Secondary | ICD-10-CM | POA: Diagnosis not present

## 2021-04-27 DIAGNOSIS — N2581 Secondary hyperparathyroidism of renal origin: Secondary | ICD-10-CM | POA: Diagnosis not present

## 2021-04-27 DIAGNOSIS — Z992 Dependence on renal dialysis: Secondary | ICD-10-CM | POA: Diagnosis not present

## 2021-04-30 DIAGNOSIS — N186 End stage renal disease: Secondary | ICD-10-CM | POA: Diagnosis not present

## 2021-04-30 DIAGNOSIS — D689 Coagulation defect, unspecified: Secondary | ICD-10-CM | POA: Diagnosis not present

## 2021-04-30 DIAGNOSIS — N2581 Secondary hyperparathyroidism of renal origin: Secondary | ICD-10-CM | POA: Diagnosis not present

## 2021-04-30 DIAGNOSIS — D631 Anemia in chronic kidney disease: Secondary | ICD-10-CM | POA: Diagnosis not present

## 2021-04-30 DIAGNOSIS — Z992 Dependence on renal dialysis: Secondary | ICD-10-CM | POA: Diagnosis not present

## 2021-05-02 DIAGNOSIS — D631 Anemia in chronic kidney disease: Secondary | ICD-10-CM | POA: Diagnosis not present

## 2021-05-02 DIAGNOSIS — N2581 Secondary hyperparathyroidism of renal origin: Secondary | ICD-10-CM | POA: Diagnosis not present

## 2021-05-02 DIAGNOSIS — D689 Coagulation defect, unspecified: Secondary | ICD-10-CM | POA: Diagnosis not present

## 2021-05-02 DIAGNOSIS — Z992 Dependence on renal dialysis: Secondary | ICD-10-CM | POA: Diagnosis not present

## 2021-05-02 DIAGNOSIS — N186 End stage renal disease: Secondary | ICD-10-CM | POA: Diagnosis not present

## 2021-05-04 DIAGNOSIS — D689 Coagulation defect, unspecified: Secondary | ICD-10-CM | POA: Diagnosis not present

## 2021-05-04 DIAGNOSIS — N186 End stage renal disease: Secondary | ICD-10-CM | POA: Diagnosis not present

## 2021-05-04 DIAGNOSIS — D631 Anemia in chronic kidney disease: Secondary | ICD-10-CM | POA: Diagnosis not present

## 2021-05-04 DIAGNOSIS — Z992 Dependence on renal dialysis: Secondary | ICD-10-CM | POA: Diagnosis not present

## 2021-05-04 DIAGNOSIS — N2581 Secondary hyperparathyroidism of renal origin: Secondary | ICD-10-CM | POA: Diagnosis not present

## 2021-05-07 DIAGNOSIS — N2581 Secondary hyperparathyroidism of renal origin: Secondary | ICD-10-CM | POA: Diagnosis not present

## 2021-05-07 DIAGNOSIS — D689 Coagulation defect, unspecified: Secondary | ICD-10-CM | POA: Diagnosis not present

## 2021-05-07 DIAGNOSIS — E1029 Type 1 diabetes mellitus with other diabetic kidney complication: Secondary | ICD-10-CM | POA: Diagnosis not present

## 2021-05-07 DIAGNOSIS — D509 Iron deficiency anemia, unspecified: Secondary | ICD-10-CM | POA: Diagnosis not present

## 2021-05-07 DIAGNOSIS — Z992 Dependence on renal dialysis: Secondary | ICD-10-CM | POA: Diagnosis not present

## 2021-05-07 DIAGNOSIS — N186 End stage renal disease: Secondary | ICD-10-CM | POA: Diagnosis not present

## 2021-05-09 DIAGNOSIS — D689 Coagulation defect, unspecified: Secondary | ICD-10-CM | POA: Diagnosis not present

## 2021-05-09 DIAGNOSIS — D509 Iron deficiency anemia, unspecified: Secondary | ICD-10-CM | POA: Diagnosis not present

## 2021-05-09 DIAGNOSIS — E1029 Type 1 diabetes mellitus with other diabetic kidney complication: Secondary | ICD-10-CM | POA: Diagnosis not present

## 2021-05-09 DIAGNOSIS — Z992 Dependence on renal dialysis: Secondary | ICD-10-CM | POA: Diagnosis not present

## 2021-05-09 DIAGNOSIS — N186 End stage renal disease: Secondary | ICD-10-CM | POA: Diagnosis not present

## 2021-05-09 DIAGNOSIS — N2581 Secondary hyperparathyroidism of renal origin: Secondary | ICD-10-CM | POA: Diagnosis not present

## 2021-05-11 DIAGNOSIS — E1029 Type 1 diabetes mellitus with other diabetic kidney complication: Secondary | ICD-10-CM | POA: Diagnosis not present

## 2021-05-11 DIAGNOSIS — D509 Iron deficiency anemia, unspecified: Secondary | ICD-10-CM | POA: Diagnosis not present

## 2021-05-11 DIAGNOSIS — Z992 Dependence on renal dialysis: Secondary | ICD-10-CM | POA: Diagnosis not present

## 2021-05-11 DIAGNOSIS — N186 End stage renal disease: Secondary | ICD-10-CM | POA: Diagnosis not present

## 2021-05-11 DIAGNOSIS — D689 Coagulation defect, unspecified: Secondary | ICD-10-CM | POA: Diagnosis not present

## 2021-05-11 DIAGNOSIS — N2581 Secondary hyperparathyroidism of renal origin: Secondary | ICD-10-CM | POA: Diagnosis not present

## 2021-05-14 DIAGNOSIS — Z992 Dependence on renal dialysis: Secondary | ICD-10-CM | POA: Diagnosis not present

## 2021-05-14 DIAGNOSIS — D689 Coagulation defect, unspecified: Secondary | ICD-10-CM | POA: Diagnosis not present

## 2021-05-14 DIAGNOSIS — N186 End stage renal disease: Secondary | ICD-10-CM | POA: Diagnosis not present

## 2021-05-14 DIAGNOSIS — N2581 Secondary hyperparathyroidism of renal origin: Secondary | ICD-10-CM | POA: Diagnosis not present

## 2021-05-14 DIAGNOSIS — D631 Anemia in chronic kidney disease: Secondary | ICD-10-CM | POA: Diagnosis not present

## 2021-05-16 DIAGNOSIS — Z992 Dependence on renal dialysis: Secondary | ICD-10-CM | POA: Diagnosis not present

## 2021-05-16 DIAGNOSIS — D631 Anemia in chronic kidney disease: Secondary | ICD-10-CM | POA: Diagnosis not present

## 2021-05-16 DIAGNOSIS — N186 End stage renal disease: Secondary | ICD-10-CM | POA: Diagnosis not present

## 2021-05-16 DIAGNOSIS — D689 Coagulation defect, unspecified: Secondary | ICD-10-CM | POA: Diagnosis not present

## 2021-05-16 DIAGNOSIS — N2581 Secondary hyperparathyroidism of renal origin: Secondary | ICD-10-CM | POA: Diagnosis not present

## 2021-05-17 DIAGNOSIS — E1022 Type 1 diabetes mellitus with diabetic chronic kidney disease: Secondary | ICD-10-CM | POA: Diagnosis not present

## 2021-05-18 DIAGNOSIS — N2581 Secondary hyperparathyroidism of renal origin: Secondary | ICD-10-CM | POA: Diagnosis not present

## 2021-05-18 DIAGNOSIS — Z992 Dependence on renal dialysis: Secondary | ICD-10-CM | POA: Diagnosis not present

## 2021-05-18 DIAGNOSIS — D689 Coagulation defect, unspecified: Secondary | ICD-10-CM | POA: Diagnosis not present

## 2021-05-18 DIAGNOSIS — D631 Anemia in chronic kidney disease: Secondary | ICD-10-CM | POA: Diagnosis not present

## 2021-05-18 DIAGNOSIS — N186 End stage renal disease: Secondary | ICD-10-CM | POA: Diagnosis not present

## 2021-05-20 DIAGNOSIS — N186 End stage renal disease: Secondary | ICD-10-CM | POA: Diagnosis not present

## 2021-05-20 DIAGNOSIS — E1029 Type 1 diabetes mellitus with other diabetic kidney complication: Secondary | ICD-10-CM | POA: Diagnosis not present

## 2021-05-20 DIAGNOSIS — Z992 Dependence on renal dialysis: Secondary | ICD-10-CM | POA: Diagnosis not present

## 2021-05-21 DIAGNOSIS — Z992 Dependence on renal dialysis: Secondary | ICD-10-CM | POA: Diagnosis not present

## 2021-05-21 DIAGNOSIS — N186 End stage renal disease: Secondary | ICD-10-CM | POA: Diagnosis not present

## 2021-05-21 DIAGNOSIS — D689 Coagulation defect, unspecified: Secondary | ICD-10-CM | POA: Diagnosis not present

## 2021-05-21 DIAGNOSIS — N2581 Secondary hyperparathyroidism of renal origin: Secondary | ICD-10-CM | POA: Diagnosis not present

## 2021-05-21 DIAGNOSIS — D509 Iron deficiency anemia, unspecified: Secondary | ICD-10-CM | POA: Diagnosis not present

## 2021-05-23 DIAGNOSIS — D689 Coagulation defect, unspecified: Secondary | ICD-10-CM | POA: Diagnosis not present

## 2021-05-23 DIAGNOSIS — N186 End stage renal disease: Secondary | ICD-10-CM | POA: Diagnosis not present

## 2021-05-23 DIAGNOSIS — D509 Iron deficiency anemia, unspecified: Secondary | ICD-10-CM | POA: Diagnosis not present

## 2021-05-23 DIAGNOSIS — Z992 Dependence on renal dialysis: Secondary | ICD-10-CM | POA: Diagnosis not present

## 2021-05-23 DIAGNOSIS — N2581 Secondary hyperparathyroidism of renal origin: Secondary | ICD-10-CM | POA: Diagnosis not present

## 2021-05-25 DIAGNOSIS — D689 Coagulation defect, unspecified: Secondary | ICD-10-CM | POA: Diagnosis not present

## 2021-05-25 DIAGNOSIS — N186 End stage renal disease: Secondary | ICD-10-CM | POA: Diagnosis not present

## 2021-05-25 DIAGNOSIS — N2581 Secondary hyperparathyroidism of renal origin: Secondary | ICD-10-CM | POA: Diagnosis not present

## 2021-05-25 DIAGNOSIS — D509 Iron deficiency anemia, unspecified: Secondary | ICD-10-CM | POA: Diagnosis not present

## 2021-05-25 DIAGNOSIS — Z992 Dependence on renal dialysis: Secondary | ICD-10-CM | POA: Diagnosis not present

## 2021-05-28 DIAGNOSIS — D689 Coagulation defect, unspecified: Secondary | ICD-10-CM | POA: Diagnosis not present

## 2021-05-28 DIAGNOSIS — Z992 Dependence on renal dialysis: Secondary | ICD-10-CM | POA: Diagnosis not present

## 2021-05-28 DIAGNOSIS — N186 End stage renal disease: Secondary | ICD-10-CM | POA: Diagnosis not present

## 2021-05-28 DIAGNOSIS — D509 Iron deficiency anemia, unspecified: Secondary | ICD-10-CM | POA: Diagnosis not present

## 2021-05-28 DIAGNOSIS — N2581 Secondary hyperparathyroidism of renal origin: Secondary | ICD-10-CM | POA: Diagnosis not present

## 2021-05-30 DIAGNOSIS — N186 End stage renal disease: Secondary | ICD-10-CM | POA: Diagnosis not present

## 2021-05-30 DIAGNOSIS — N2581 Secondary hyperparathyroidism of renal origin: Secondary | ICD-10-CM | POA: Diagnosis not present

## 2021-05-30 DIAGNOSIS — D689 Coagulation defect, unspecified: Secondary | ICD-10-CM | POA: Diagnosis not present

## 2021-05-30 DIAGNOSIS — Z992 Dependence on renal dialysis: Secondary | ICD-10-CM | POA: Diagnosis not present

## 2021-05-30 DIAGNOSIS — D509 Iron deficiency anemia, unspecified: Secondary | ICD-10-CM | POA: Diagnosis not present

## 2021-06-01 DIAGNOSIS — Z992 Dependence on renal dialysis: Secondary | ICD-10-CM | POA: Diagnosis not present

## 2021-06-01 DIAGNOSIS — D509 Iron deficiency anemia, unspecified: Secondary | ICD-10-CM | POA: Diagnosis not present

## 2021-06-01 DIAGNOSIS — N186 End stage renal disease: Secondary | ICD-10-CM | POA: Diagnosis not present

## 2021-06-01 DIAGNOSIS — D689 Coagulation defect, unspecified: Secondary | ICD-10-CM | POA: Diagnosis not present

## 2021-06-01 DIAGNOSIS — N2581 Secondary hyperparathyroidism of renal origin: Secondary | ICD-10-CM | POA: Diagnosis not present

## 2021-06-04 DIAGNOSIS — D689 Coagulation defect, unspecified: Secondary | ICD-10-CM | POA: Diagnosis not present

## 2021-06-04 DIAGNOSIS — Z992 Dependence on renal dialysis: Secondary | ICD-10-CM | POA: Diagnosis not present

## 2021-06-04 DIAGNOSIS — N2581 Secondary hyperparathyroidism of renal origin: Secondary | ICD-10-CM | POA: Diagnosis not present

## 2021-06-04 DIAGNOSIS — E1029 Type 1 diabetes mellitus with other diabetic kidney complication: Secondary | ICD-10-CM | POA: Diagnosis not present

## 2021-06-04 DIAGNOSIS — N186 End stage renal disease: Secondary | ICD-10-CM | POA: Diagnosis not present

## 2021-06-04 DIAGNOSIS — D509 Iron deficiency anemia, unspecified: Secondary | ICD-10-CM | POA: Diagnosis not present

## 2021-06-06 DIAGNOSIS — D509 Iron deficiency anemia, unspecified: Secondary | ICD-10-CM | POA: Diagnosis not present

## 2021-06-06 DIAGNOSIS — N2581 Secondary hyperparathyroidism of renal origin: Secondary | ICD-10-CM | POA: Diagnosis not present

## 2021-06-06 DIAGNOSIS — N186 End stage renal disease: Secondary | ICD-10-CM | POA: Diagnosis not present

## 2021-06-06 DIAGNOSIS — Z992 Dependence on renal dialysis: Secondary | ICD-10-CM | POA: Diagnosis not present

## 2021-06-06 DIAGNOSIS — E1029 Type 1 diabetes mellitus with other diabetic kidney complication: Secondary | ICD-10-CM | POA: Diagnosis not present

## 2021-06-06 DIAGNOSIS — D689 Coagulation defect, unspecified: Secondary | ICD-10-CM | POA: Diagnosis not present

## 2021-06-08 DIAGNOSIS — D689 Coagulation defect, unspecified: Secondary | ICD-10-CM | POA: Diagnosis not present

## 2021-06-08 DIAGNOSIS — Z992 Dependence on renal dialysis: Secondary | ICD-10-CM | POA: Diagnosis not present

## 2021-06-08 DIAGNOSIS — N186 End stage renal disease: Secondary | ICD-10-CM | POA: Diagnosis not present

## 2021-06-08 DIAGNOSIS — E1029 Type 1 diabetes mellitus with other diabetic kidney complication: Secondary | ICD-10-CM | POA: Diagnosis not present

## 2021-06-08 DIAGNOSIS — N2581 Secondary hyperparathyroidism of renal origin: Secondary | ICD-10-CM | POA: Diagnosis not present

## 2021-06-08 DIAGNOSIS — D509 Iron deficiency anemia, unspecified: Secondary | ICD-10-CM | POA: Diagnosis not present

## 2021-06-10 DIAGNOSIS — D689 Coagulation defect, unspecified: Secondary | ICD-10-CM | POA: Diagnosis not present

## 2021-06-10 DIAGNOSIS — N186 End stage renal disease: Secondary | ICD-10-CM | POA: Diagnosis not present

## 2021-06-10 DIAGNOSIS — N2581 Secondary hyperparathyroidism of renal origin: Secondary | ICD-10-CM | POA: Diagnosis not present

## 2021-06-10 DIAGNOSIS — Z992 Dependence on renal dialysis: Secondary | ICD-10-CM | POA: Diagnosis not present

## 2021-06-10 DIAGNOSIS — D509 Iron deficiency anemia, unspecified: Secondary | ICD-10-CM | POA: Diagnosis not present

## 2021-06-12 DIAGNOSIS — N2581 Secondary hyperparathyroidism of renal origin: Secondary | ICD-10-CM | POA: Diagnosis not present

## 2021-06-12 DIAGNOSIS — D509 Iron deficiency anemia, unspecified: Secondary | ICD-10-CM | POA: Diagnosis not present

## 2021-06-12 DIAGNOSIS — D689 Coagulation defect, unspecified: Secondary | ICD-10-CM | POA: Diagnosis not present

## 2021-06-12 DIAGNOSIS — N186 End stage renal disease: Secondary | ICD-10-CM | POA: Diagnosis not present

## 2021-06-12 DIAGNOSIS — Z992 Dependence on renal dialysis: Secondary | ICD-10-CM | POA: Diagnosis not present

## 2021-06-14 DIAGNOSIS — E1022 Type 1 diabetes mellitus with diabetic chronic kidney disease: Secondary | ICD-10-CM | POA: Diagnosis not present

## 2021-06-15 DIAGNOSIS — N2581 Secondary hyperparathyroidism of renal origin: Secondary | ICD-10-CM | POA: Diagnosis not present

## 2021-06-15 DIAGNOSIS — D689 Coagulation defect, unspecified: Secondary | ICD-10-CM | POA: Diagnosis not present

## 2021-06-15 DIAGNOSIS — D509 Iron deficiency anemia, unspecified: Secondary | ICD-10-CM | POA: Diagnosis not present

## 2021-06-15 DIAGNOSIS — N186 End stage renal disease: Secondary | ICD-10-CM | POA: Diagnosis not present

## 2021-06-15 DIAGNOSIS — Z992 Dependence on renal dialysis: Secondary | ICD-10-CM | POA: Diagnosis not present

## 2021-06-17 DIAGNOSIS — E1022 Type 1 diabetes mellitus with diabetic chronic kidney disease: Secondary | ICD-10-CM | POA: Diagnosis not present

## 2021-06-18 DIAGNOSIS — D689 Coagulation defect, unspecified: Secondary | ICD-10-CM | POA: Diagnosis not present

## 2021-06-18 DIAGNOSIS — N186 End stage renal disease: Secondary | ICD-10-CM | POA: Diagnosis not present

## 2021-06-18 DIAGNOSIS — N2581 Secondary hyperparathyroidism of renal origin: Secondary | ICD-10-CM | POA: Diagnosis not present

## 2021-06-18 DIAGNOSIS — Z992 Dependence on renal dialysis: Secondary | ICD-10-CM | POA: Diagnosis not present

## 2021-06-19 DIAGNOSIS — Z992 Dependence on renal dialysis: Secondary | ICD-10-CM | POA: Diagnosis not present

## 2021-06-19 DIAGNOSIS — E1029 Type 1 diabetes mellitus with other diabetic kidney complication: Secondary | ICD-10-CM | POA: Diagnosis not present

## 2021-06-19 DIAGNOSIS — N186 End stage renal disease: Secondary | ICD-10-CM | POA: Diagnosis not present

## 2021-06-20 DIAGNOSIS — N186 End stage renal disease: Secondary | ICD-10-CM | POA: Diagnosis not present

## 2021-06-20 DIAGNOSIS — D689 Coagulation defect, unspecified: Secondary | ICD-10-CM | POA: Diagnosis not present

## 2021-06-20 DIAGNOSIS — Z992 Dependence on renal dialysis: Secondary | ICD-10-CM | POA: Diagnosis not present

## 2021-06-20 DIAGNOSIS — N2581 Secondary hyperparathyroidism of renal origin: Secondary | ICD-10-CM | POA: Diagnosis not present

## 2021-06-22 DIAGNOSIS — Z992 Dependence on renal dialysis: Secondary | ICD-10-CM | POA: Diagnosis not present

## 2021-06-22 DIAGNOSIS — N186 End stage renal disease: Secondary | ICD-10-CM | POA: Diagnosis not present

## 2021-06-22 DIAGNOSIS — N2581 Secondary hyperparathyroidism of renal origin: Secondary | ICD-10-CM | POA: Diagnosis not present

## 2021-06-22 DIAGNOSIS — D689 Coagulation defect, unspecified: Secondary | ICD-10-CM | POA: Diagnosis not present

## 2021-06-25 DIAGNOSIS — N186 End stage renal disease: Secondary | ICD-10-CM | POA: Diagnosis not present

## 2021-06-25 DIAGNOSIS — Z992 Dependence on renal dialysis: Secondary | ICD-10-CM | POA: Diagnosis not present

## 2021-06-25 DIAGNOSIS — D689 Coagulation defect, unspecified: Secondary | ICD-10-CM | POA: Diagnosis not present

## 2021-06-25 DIAGNOSIS — N2581 Secondary hyperparathyroidism of renal origin: Secondary | ICD-10-CM | POA: Diagnosis not present

## 2021-06-27 DIAGNOSIS — N2581 Secondary hyperparathyroidism of renal origin: Secondary | ICD-10-CM | POA: Diagnosis not present

## 2021-06-27 DIAGNOSIS — N186 End stage renal disease: Secondary | ICD-10-CM | POA: Diagnosis not present

## 2021-06-27 DIAGNOSIS — D689 Coagulation defect, unspecified: Secondary | ICD-10-CM | POA: Diagnosis not present

## 2021-06-27 DIAGNOSIS — Z992 Dependence on renal dialysis: Secondary | ICD-10-CM | POA: Diagnosis not present

## 2021-06-29 DIAGNOSIS — Z992 Dependence on renal dialysis: Secondary | ICD-10-CM | POA: Diagnosis not present

## 2021-06-29 DIAGNOSIS — D689 Coagulation defect, unspecified: Secondary | ICD-10-CM | POA: Diagnosis not present

## 2021-06-29 DIAGNOSIS — N2581 Secondary hyperparathyroidism of renal origin: Secondary | ICD-10-CM | POA: Diagnosis not present

## 2021-06-29 DIAGNOSIS — N186 End stage renal disease: Secondary | ICD-10-CM | POA: Diagnosis not present

## 2021-07-02 DIAGNOSIS — N186 End stage renal disease: Secondary | ICD-10-CM | POA: Diagnosis not present

## 2021-07-02 DIAGNOSIS — D689 Coagulation defect, unspecified: Secondary | ICD-10-CM | POA: Diagnosis not present

## 2021-07-02 DIAGNOSIS — N2581 Secondary hyperparathyroidism of renal origin: Secondary | ICD-10-CM | POA: Diagnosis not present

## 2021-07-02 DIAGNOSIS — Z992 Dependence on renal dialysis: Secondary | ICD-10-CM | POA: Diagnosis not present

## 2021-07-03 DIAGNOSIS — Z992 Dependence on renal dialysis: Secondary | ICD-10-CM | POA: Diagnosis not present

## 2021-07-03 DIAGNOSIS — E103593 Type 1 diabetes mellitus with proliferative diabetic retinopathy without macular edema, bilateral: Secondary | ICD-10-CM | POA: Diagnosis not present

## 2021-07-03 DIAGNOSIS — N186 End stage renal disease: Secondary | ICD-10-CM | POA: Diagnosis not present

## 2021-07-03 DIAGNOSIS — E1022 Type 1 diabetes mellitus with diabetic chronic kidney disease: Secondary | ICD-10-CM | POA: Diagnosis not present

## 2021-07-03 DIAGNOSIS — Z9641 Presence of insulin pump (external) (internal): Secondary | ICD-10-CM | POA: Diagnosis not present

## 2021-07-04 DIAGNOSIS — D689 Coagulation defect, unspecified: Secondary | ICD-10-CM | POA: Diagnosis not present

## 2021-07-04 DIAGNOSIS — N2581 Secondary hyperparathyroidism of renal origin: Secondary | ICD-10-CM | POA: Diagnosis not present

## 2021-07-04 DIAGNOSIS — N186 End stage renal disease: Secondary | ICD-10-CM | POA: Diagnosis not present

## 2021-07-04 DIAGNOSIS — Z992 Dependence on renal dialysis: Secondary | ICD-10-CM | POA: Diagnosis not present

## 2021-07-06 DIAGNOSIS — N186 End stage renal disease: Secondary | ICD-10-CM | POA: Diagnosis not present

## 2021-07-06 DIAGNOSIS — Z992 Dependence on renal dialysis: Secondary | ICD-10-CM | POA: Diagnosis not present

## 2021-07-06 DIAGNOSIS — N2581 Secondary hyperparathyroidism of renal origin: Secondary | ICD-10-CM | POA: Diagnosis not present

## 2021-07-06 DIAGNOSIS — D689 Coagulation defect, unspecified: Secondary | ICD-10-CM | POA: Diagnosis not present

## 2021-07-09 DIAGNOSIS — D631 Anemia in chronic kidney disease: Secondary | ICD-10-CM | POA: Diagnosis not present

## 2021-07-09 DIAGNOSIS — D509 Iron deficiency anemia, unspecified: Secondary | ICD-10-CM | POA: Diagnosis not present

## 2021-07-09 DIAGNOSIS — D689 Coagulation defect, unspecified: Secondary | ICD-10-CM | POA: Diagnosis not present

## 2021-07-09 DIAGNOSIS — N2581 Secondary hyperparathyroidism of renal origin: Secondary | ICD-10-CM | POA: Diagnosis not present

## 2021-07-09 DIAGNOSIS — N186 End stage renal disease: Secondary | ICD-10-CM | POA: Diagnosis not present

## 2021-07-09 DIAGNOSIS — Z992 Dependence on renal dialysis: Secondary | ICD-10-CM | POA: Diagnosis not present

## 2021-07-09 DIAGNOSIS — E1029 Type 1 diabetes mellitus with other diabetic kidney complication: Secondary | ICD-10-CM | POA: Diagnosis not present

## 2021-07-11 DIAGNOSIS — E1029 Type 1 diabetes mellitus with other diabetic kidney complication: Secondary | ICD-10-CM | POA: Diagnosis not present

## 2021-07-11 DIAGNOSIS — D631 Anemia in chronic kidney disease: Secondary | ICD-10-CM | POA: Diagnosis not present

## 2021-07-11 DIAGNOSIS — N2581 Secondary hyperparathyroidism of renal origin: Secondary | ICD-10-CM | POA: Diagnosis not present

## 2021-07-11 DIAGNOSIS — Z992 Dependence on renal dialysis: Secondary | ICD-10-CM | POA: Diagnosis not present

## 2021-07-11 DIAGNOSIS — D689 Coagulation defect, unspecified: Secondary | ICD-10-CM | POA: Diagnosis not present

## 2021-07-11 DIAGNOSIS — D509 Iron deficiency anemia, unspecified: Secondary | ICD-10-CM | POA: Diagnosis not present

## 2021-07-11 DIAGNOSIS — N186 End stage renal disease: Secondary | ICD-10-CM | POA: Diagnosis not present

## 2021-07-13 DIAGNOSIS — N2581 Secondary hyperparathyroidism of renal origin: Secondary | ICD-10-CM | POA: Diagnosis not present

## 2021-07-13 DIAGNOSIS — N186 End stage renal disease: Secondary | ICD-10-CM | POA: Diagnosis not present

## 2021-07-13 DIAGNOSIS — E1029 Type 1 diabetes mellitus with other diabetic kidney complication: Secondary | ICD-10-CM | POA: Diagnosis not present

## 2021-07-13 DIAGNOSIS — D631 Anemia in chronic kidney disease: Secondary | ICD-10-CM | POA: Diagnosis not present

## 2021-07-13 DIAGNOSIS — D509 Iron deficiency anemia, unspecified: Secondary | ICD-10-CM | POA: Diagnosis not present

## 2021-07-13 DIAGNOSIS — D689 Coagulation defect, unspecified: Secondary | ICD-10-CM | POA: Diagnosis not present

## 2021-07-13 DIAGNOSIS — Z992 Dependence on renal dialysis: Secondary | ICD-10-CM | POA: Diagnosis not present

## 2021-07-16 DIAGNOSIS — N2581 Secondary hyperparathyroidism of renal origin: Secondary | ICD-10-CM | POA: Diagnosis not present

## 2021-07-16 DIAGNOSIS — N186 End stage renal disease: Secondary | ICD-10-CM | POA: Diagnosis not present

## 2021-07-16 DIAGNOSIS — D689 Coagulation defect, unspecified: Secondary | ICD-10-CM | POA: Diagnosis not present

## 2021-07-16 DIAGNOSIS — Z992 Dependence on renal dialysis: Secondary | ICD-10-CM | POA: Diagnosis not present

## 2021-07-16 DIAGNOSIS — D509 Iron deficiency anemia, unspecified: Secondary | ICD-10-CM | POA: Diagnosis not present

## 2021-07-18 DIAGNOSIS — D689 Coagulation defect, unspecified: Secondary | ICD-10-CM | POA: Diagnosis not present

## 2021-07-18 DIAGNOSIS — Z992 Dependence on renal dialysis: Secondary | ICD-10-CM | POA: Diagnosis not present

## 2021-07-18 DIAGNOSIS — N186 End stage renal disease: Secondary | ICD-10-CM | POA: Diagnosis not present

## 2021-07-18 DIAGNOSIS — N2581 Secondary hyperparathyroidism of renal origin: Secondary | ICD-10-CM | POA: Diagnosis not present

## 2021-07-18 DIAGNOSIS — D509 Iron deficiency anemia, unspecified: Secondary | ICD-10-CM | POA: Diagnosis not present

## 2021-07-20 DIAGNOSIS — E1029 Type 1 diabetes mellitus with other diabetic kidney complication: Secondary | ICD-10-CM | POA: Diagnosis not present

## 2021-07-20 DIAGNOSIS — D689 Coagulation defect, unspecified: Secondary | ICD-10-CM | POA: Diagnosis not present

## 2021-07-20 DIAGNOSIS — Z992 Dependence on renal dialysis: Secondary | ICD-10-CM | POA: Diagnosis not present

## 2021-07-20 DIAGNOSIS — D509 Iron deficiency anemia, unspecified: Secondary | ICD-10-CM | POA: Diagnosis not present

## 2021-07-20 DIAGNOSIS — N186 End stage renal disease: Secondary | ICD-10-CM | POA: Diagnosis not present

## 2021-07-20 DIAGNOSIS — N2581 Secondary hyperparathyroidism of renal origin: Secondary | ICD-10-CM | POA: Diagnosis not present

## 2021-07-23 DIAGNOSIS — Z992 Dependence on renal dialysis: Secondary | ICD-10-CM | POA: Diagnosis not present

## 2021-07-23 DIAGNOSIS — D631 Anemia in chronic kidney disease: Secondary | ICD-10-CM | POA: Diagnosis not present

## 2021-07-23 DIAGNOSIS — D509 Iron deficiency anemia, unspecified: Secondary | ICD-10-CM | POA: Diagnosis not present

## 2021-07-23 DIAGNOSIS — N2581 Secondary hyperparathyroidism of renal origin: Secondary | ICD-10-CM | POA: Diagnosis not present

## 2021-07-23 DIAGNOSIS — D689 Coagulation defect, unspecified: Secondary | ICD-10-CM | POA: Diagnosis not present

## 2021-07-23 DIAGNOSIS — N186 End stage renal disease: Secondary | ICD-10-CM | POA: Diagnosis not present

## 2021-07-25 DIAGNOSIS — D509 Iron deficiency anemia, unspecified: Secondary | ICD-10-CM | POA: Diagnosis not present

## 2021-07-25 DIAGNOSIS — N2581 Secondary hyperparathyroidism of renal origin: Secondary | ICD-10-CM | POA: Diagnosis not present

## 2021-07-25 DIAGNOSIS — N186 End stage renal disease: Secondary | ICD-10-CM | POA: Diagnosis not present

## 2021-07-25 DIAGNOSIS — D631 Anemia in chronic kidney disease: Secondary | ICD-10-CM | POA: Diagnosis not present

## 2021-07-25 DIAGNOSIS — D689 Coagulation defect, unspecified: Secondary | ICD-10-CM | POA: Diagnosis not present

## 2021-07-25 DIAGNOSIS — Z992 Dependence on renal dialysis: Secondary | ICD-10-CM | POA: Diagnosis not present

## 2021-07-27 DIAGNOSIS — N186 End stage renal disease: Secondary | ICD-10-CM | POA: Diagnosis not present

## 2021-07-27 DIAGNOSIS — Z992 Dependence on renal dialysis: Secondary | ICD-10-CM | POA: Diagnosis not present

## 2021-07-27 DIAGNOSIS — D631 Anemia in chronic kidney disease: Secondary | ICD-10-CM | POA: Diagnosis not present

## 2021-07-27 DIAGNOSIS — N2581 Secondary hyperparathyroidism of renal origin: Secondary | ICD-10-CM | POA: Diagnosis not present

## 2021-07-27 DIAGNOSIS — D509 Iron deficiency anemia, unspecified: Secondary | ICD-10-CM | POA: Diagnosis not present

## 2021-07-27 DIAGNOSIS — D689 Coagulation defect, unspecified: Secondary | ICD-10-CM | POA: Diagnosis not present

## 2021-07-30 DIAGNOSIS — Z992 Dependence on renal dialysis: Secondary | ICD-10-CM | POA: Diagnosis not present

## 2021-07-30 DIAGNOSIS — D689 Coagulation defect, unspecified: Secondary | ICD-10-CM | POA: Diagnosis not present

## 2021-07-30 DIAGNOSIS — N2581 Secondary hyperparathyroidism of renal origin: Secondary | ICD-10-CM | POA: Diagnosis not present

## 2021-07-30 DIAGNOSIS — N186 End stage renal disease: Secondary | ICD-10-CM | POA: Diagnosis not present

## 2021-07-30 DIAGNOSIS — D509 Iron deficiency anemia, unspecified: Secondary | ICD-10-CM | POA: Diagnosis not present

## 2021-07-31 DIAGNOSIS — E1022 Type 1 diabetes mellitus with diabetic chronic kidney disease: Secondary | ICD-10-CM | POA: Diagnosis not present

## 2021-08-01 DIAGNOSIS — D689 Coagulation defect, unspecified: Secondary | ICD-10-CM | POA: Diagnosis not present

## 2021-08-01 DIAGNOSIS — Z992 Dependence on renal dialysis: Secondary | ICD-10-CM | POA: Diagnosis not present

## 2021-08-01 DIAGNOSIS — N186 End stage renal disease: Secondary | ICD-10-CM | POA: Diagnosis not present

## 2021-08-01 DIAGNOSIS — N2581 Secondary hyperparathyroidism of renal origin: Secondary | ICD-10-CM | POA: Diagnosis not present

## 2021-08-01 DIAGNOSIS — D509 Iron deficiency anemia, unspecified: Secondary | ICD-10-CM | POA: Diagnosis not present

## 2021-08-03 DIAGNOSIS — D509 Iron deficiency anemia, unspecified: Secondary | ICD-10-CM | POA: Diagnosis not present

## 2021-08-03 DIAGNOSIS — N2581 Secondary hyperparathyroidism of renal origin: Secondary | ICD-10-CM | POA: Diagnosis not present

## 2021-08-03 DIAGNOSIS — Z992 Dependence on renal dialysis: Secondary | ICD-10-CM | POA: Diagnosis not present

## 2021-08-03 DIAGNOSIS — D689 Coagulation defect, unspecified: Secondary | ICD-10-CM | POA: Diagnosis not present

## 2021-08-03 DIAGNOSIS — N186 End stage renal disease: Secondary | ICD-10-CM | POA: Diagnosis not present

## 2021-08-06 DIAGNOSIS — D631 Anemia in chronic kidney disease: Secondary | ICD-10-CM | POA: Diagnosis not present

## 2021-08-06 DIAGNOSIS — Z992 Dependence on renal dialysis: Secondary | ICD-10-CM | POA: Diagnosis not present

## 2021-08-06 DIAGNOSIS — E1029 Type 1 diabetes mellitus with other diabetic kidney complication: Secondary | ICD-10-CM | POA: Diagnosis not present

## 2021-08-06 DIAGNOSIS — D689 Coagulation defect, unspecified: Secondary | ICD-10-CM | POA: Diagnosis not present

## 2021-08-06 DIAGNOSIS — D509 Iron deficiency anemia, unspecified: Secondary | ICD-10-CM | POA: Diagnosis not present

## 2021-08-06 DIAGNOSIS — N2581 Secondary hyperparathyroidism of renal origin: Secondary | ICD-10-CM | POA: Diagnosis not present

## 2021-08-06 DIAGNOSIS — N186 End stage renal disease: Secondary | ICD-10-CM | POA: Diagnosis not present

## 2021-08-08 DIAGNOSIS — Z992 Dependence on renal dialysis: Secondary | ICD-10-CM | POA: Diagnosis not present

## 2021-08-08 DIAGNOSIS — E1029 Type 1 diabetes mellitus with other diabetic kidney complication: Secondary | ICD-10-CM | POA: Diagnosis not present

## 2021-08-08 DIAGNOSIS — N186 End stage renal disease: Secondary | ICD-10-CM | POA: Diagnosis not present

## 2021-08-08 DIAGNOSIS — N2581 Secondary hyperparathyroidism of renal origin: Secondary | ICD-10-CM | POA: Diagnosis not present

## 2021-08-08 DIAGNOSIS — D631 Anemia in chronic kidney disease: Secondary | ICD-10-CM | POA: Diagnosis not present

## 2021-08-08 DIAGNOSIS — D509 Iron deficiency anemia, unspecified: Secondary | ICD-10-CM | POA: Diagnosis not present

## 2021-08-08 DIAGNOSIS — D689 Coagulation defect, unspecified: Secondary | ICD-10-CM | POA: Diagnosis not present

## 2021-08-10 DIAGNOSIS — D631 Anemia in chronic kidney disease: Secondary | ICD-10-CM | POA: Diagnosis not present

## 2021-08-10 DIAGNOSIS — Z992 Dependence on renal dialysis: Secondary | ICD-10-CM | POA: Diagnosis not present

## 2021-08-10 DIAGNOSIS — D509 Iron deficiency anemia, unspecified: Secondary | ICD-10-CM | POA: Diagnosis not present

## 2021-08-10 DIAGNOSIS — E1029 Type 1 diabetes mellitus with other diabetic kidney complication: Secondary | ICD-10-CM | POA: Diagnosis not present

## 2021-08-10 DIAGNOSIS — N2581 Secondary hyperparathyroidism of renal origin: Secondary | ICD-10-CM | POA: Diagnosis not present

## 2021-08-10 DIAGNOSIS — D689 Coagulation defect, unspecified: Secondary | ICD-10-CM | POA: Diagnosis not present

## 2021-08-10 DIAGNOSIS — N186 End stage renal disease: Secondary | ICD-10-CM | POA: Diagnosis not present

## 2021-08-13 DIAGNOSIS — Z992 Dependence on renal dialysis: Secondary | ICD-10-CM | POA: Diagnosis not present

## 2021-08-13 DIAGNOSIS — D509 Iron deficiency anemia, unspecified: Secondary | ICD-10-CM | POA: Diagnosis not present

## 2021-08-13 DIAGNOSIS — N186 End stage renal disease: Secondary | ICD-10-CM | POA: Diagnosis not present

## 2021-08-13 DIAGNOSIS — N2581 Secondary hyperparathyroidism of renal origin: Secondary | ICD-10-CM | POA: Diagnosis not present

## 2021-08-13 DIAGNOSIS — D689 Coagulation defect, unspecified: Secondary | ICD-10-CM | POA: Diagnosis not present

## 2021-08-15 DIAGNOSIS — D689 Coagulation defect, unspecified: Secondary | ICD-10-CM | POA: Diagnosis not present

## 2021-08-15 DIAGNOSIS — Z992 Dependence on renal dialysis: Secondary | ICD-10-CM | POA: Diagnosis not present

## 2021-08-15 DIAGNOSIS — D509 Iron deficiency anemia, unspecified: Secondary | ICD-10-CM | POA: Diagnosis not present

## 2021-08-15 DIAGNOSIS — N186 End stage renal disease: Secondary | ICD-10-CM | POA: Diagnosis not present

## 2021-08-15 DIAGNOSIS — N2581 Secondary hyperparathyroidism of renal origin: Secondary | ICD-10-CM | POA: Diagnosis not present

## 2021-08-17 DIAGNOSIS — N186 End stage renal disease: Secondary | ICD-10-CM | POA: Diagnosis not present

## 2021-08-17 DIAGNOSIS — Z992 Dependence on renal dialysis: Secondary | ICD-10-CM | POA: Diagnosis not present

## 2021-08-17 DIAGNOSIS — D689 Coagulation defect, unspecified: Secondary | ICD-10-CM | POA: Diagnosis not present

## 2021-08-17 DIAGNOSIS — N2581 Secondary hyperparathyroidism of renal origin: Secondary | ICD-10-CM | POA: Diagnosis not present

## 2021-08-17 DIAGNOSIS — D509 Iron deficiency anemia, unspecified: Secondary | ICD-10-CM | POA: Diagnosis not present

## 2021-08-20 DIAGNOSIS — N186 End stage renal disease: Secondary | ICD-10-CM | POA: Diagnosis not present

## 2021-08-20 DIAGNOSIS — D509 Iron deficiency anemia, unspecified: Secondary | ICD-10-CM | POA: Diagnosis not present

## 2021-08-20 DIAGNOSIS — D689 Coagulation defect, unspecified: Secondary | ICD-10-CM | POA: Diagnosis not present

## 2021-08-20 DIAGNOSIS — N2581 Secondary hyperparathyroidism of renal origin: Secondary | ICD-10-CM | POA: Diagnosis not present

## 2021-08-20 DIAGNOSIS — E1029 Type 1 diabetes mellitus with other diabetic kidney complication: Secondary | ICD-10-CM | POA: Diagnosis not present

## 2021-08-20 DIAGNOSIS — Z992 Dependence on renal dialysis: Secondary | ICD-10-CM | POA: Diagnosis not present

## 2021-08-22 DIAGNOSIS — E1029 Type 1 diabetes mellitus with other diabetic kidney complication: Secondary | ICD-10-CM | POA: Diagnosis not present

## 2021-08-22 DIAGNOSIS — D631 Anemia in chronic kidney disease: Secondary | ICD-10-CM | POA: Diagnosis not present

## 2021-08-22 DIAGNOSIS — N186 End stage renal disease: Secondary | ICD-10-CM | POA: Diagnosis not present

## 2021-08-22 DIAGNOSIS — D689 Coagulation defect, unspecified: Secondary | ICD-10-CM | POA: Diagnosis not present

## 2021-08-22 DIAGNOSIS — D509 Iron deficiency anemia, unspecified: Secondary | ICD-10-CM | POA: Diagnosis not present

## 2021-08-22 DIAGNOSIS — N2581 Secondary hyperparathyroidism of renal origin: Secondary | ICD-10-CM | POA: Diagnosis not present

## 2021-08-22 DIAGNOSIS — Z992 Dependence on renal dialysis: Secondary | ICD-10-CM | POA: Diagnosis not present

## 2021-08-24 DIAGNOSIS — D631 Anemia in chronic kidney disease: Secondary | ICD-10-CM | POA: Diagnosis not present

## 2021-08-24 DIAGNOSIS — N2581 Secondary hyperparathyroidism of renal origin: Secondary | ICD-10-CM | POA: Diagnosis not present

## 2021-08-24 DIAGNOSIS — E1029 Type 1 diabetes mellitus with other diabetic kidney complication: Secondary | ICD-10-CM | POA: Diagnosis not present

## 2021-08-24 DIAGNOSIS — Z992 Dependence on renal dialysis: Secondary | ICD-10-CM | POA: Diagnosis not present

## 2021-08-24 DIAGNOSIS — N186 End stage renal disease: Secondary | ICD-10-CM | POA: Diagnosis not present

## 2021-08-24 DIAGNOSIS — D689 Coagulation defect, unspecified: Secondary | ICD-10-CM | POA: Diagnosis not present

## 2021-08-24 DIAGNOSIS — D509 Iron deficiency anemia, unspecified: Secondary | ICD-10-CM | POA: Diagnosis not present

## 2021-08-27 DIAGNOSIS — Z992 Dependence on renal dialysis: Secondary | ICD-10-CM | POA: Diagnosis not present

## 2021-08-27 DIAGNOSIS — N2581 Secondary hyperparathyroidism of renal origin: Secondary | ICD-10-CM | POA: Diagnosis not present

## 2021-08-27 DIAGNOSIS — N186 End stage renal disease: Secondary | ICD-10-CM | POA: Diagnosis not present

## 2021-08-27 DIAGNOSIS — D509 Iron deficiency anemia, unspecified: Secondary | ICD-10-CM | POA: Diagnosis not present

## 2021-08-27 DIAGNOSIS — D689 Coagulation defect, unspecified: Secondary | ICD-10-CM | POA: Diagnosis not present

## 2021-08-29 DIAGNOSIS — D509 Iron deficiency anemia, unspecified: Secondary | ICD-10-CM | POA: Diagnosis not present

## 2021-08-29 DIAGNOSIS — D689 Coagulation defect, unspecified: Secondary | ICD-10-CM | POA: Diagnosis not present

## 2021-08-29 DIAGNOSIS — N186 End stage renal disease: Secondary | ICD-10-CM | POA: Diagnosis not present

## 2021-08-29 DIAGNOSIS — N2581 Secondary hyperparathyroidism of renal origin: Secondary | ICD-10-CM | POA: Diagnosis not present

## 2021-08-29 DIAGNOSIS — Z992 Dependence on renal dialysis: Secondary | ICD-10-CM | POA: Diagnosis not present

## 2021-08-31 DIAGNOSIS — D689 Coagulation defect, unspecified: Secondary | ICD-10-CM | POA: Diagnosis not present

## 2021-08-31 DIAGNOSIS — N186 End stage renal disease: Secondary | ICD-10-CM | POA: Diagnosis not present

## 2021-08-31 DIAGNOSIS — N2581 Secondary hyperparathyroidism of renal origin: Secondary | ICD-10-CM | POA: Diagnosis not present

## 2021-08-31 DIAGNOSIS — Z992 Dependence on renal dialysis: Secondary | ICD-10-CM | POA: Diagnosis not present

## 2021-08-31 DIAGNOSIS — D509 Iron deficiency anemia, unspecified: Secondary | ICD-10-CM | POA: Diagnosis not present

## 2021-09-02 DIAGNOSIS — E1022 Type 1 diabetes mellitus with diabetic chronic kidney disease: Secondary | ICD-10-CM | POA: Diagnosis not present

## 2021-09-03 DIAGNOSIS — R739 Hyperglycemia, unspecified: Secondary | ICD-10-CM | POA: Diagnosis not present

## 2021-09-03 DIAGNOSIS — D689 Coagulation defect, unspecified: Secondary | ICD-10-CM | POA: Diagnosis not present

## 2021-09-03 DIAGNOSIS — Z992 Dependence on renal dialysis: Secondary | ICD-10-CM | POA: Diagnosis not present

## 2021-09-03 DIAGNOSIS — N186 End stage renal disease: Secondary | ICD-10-CM | POA: Diagnosis not present

## 2021-09-03 DIAGNOSIS — D509 Iron deficiency anemia, unspecified: Secondary | ICD-10-CM | POA: Diagnosis not present

## 2021-09-03 DIAGNOSIS — E1029 Type 1 diabetes mellitus with other diabetic kidney complication: Secondary | ICD-10-CM | POA: Diagnosis not present

## 2021-09-03 DIAGNOSIS — N2581 Secondary hyperparathyroidism of renal origin: Secondary | ICD-10-CM | POA: Diagnosis not present

## 2021-09-05 DIAGNOSIS — N2581 Secondary hyperparathyroidism of renal origin: Secondary | ICD-10-CM | POA: Diagnosis not present

## 2021-09-05 DIAGNOSIS — R739 Hyperglycemia, unspecified: Secondary | ICD-10-CM | POA: Diagnosis not present

## 2021-09-05 DIAGNOSIS — Z992 Dependence on renal dialysis: Secondary | ICD-10-CM | POA: Diagnosis not present

## 2021-09-05 DIAGNOSIS — D689 Coagulation defect, unspecified: Secondary | ICD-10-CM | POA: Diagnosis not present

## 2021-09-05 DIAGNOSIS — N186 End stage renal disease: Secondary | ICD-10-CM | POA: Diagnosis not present

## 2021-09-05 DIAGNOSIS — D509 Iron deficiency anemia, unspecified: Secondary | ICD-10-CM | POA: Diagnosis not present

## 2021-09-05 DIAGNOSIS — E1029 Type 1 diabetes mellitus with other diabetic kidney complication: Secondary | ICD-10-CM | POA: Diagnosis not present

## 2021-09-07 DIAGNOSIS — R739 Hyperglycemia, unspecified: Secondary | ICD-10-CM | POA: Diagnosis not present

## 2021-09-07 DIAGNOSIS — N186 End stage renal disease: Secondary | ICD-10-CM | POA: Diagnosis not present

## 2021-09-07 DIAGNOSIS — D509 Iron deficiency anemia, unspecified: Secondary | ICD-10-CM | POA: Diagnosis not present

## 2021-09-07 DIAGNOSIS — N2581 Secondary hyperparathyroidism of renal origin: Secondary | ICD-10-CM | POA: Diagnosis not present

## 2021-09-07 DIAGNOSIS — Z992 Dependence on renal dialysis: Secondary | ICD-10-CM | POA: Diagnosis not present

## 2021-09-07 DIAGNOSIS — E1029 Type 1 diabetes mellitus with other diabetic kidney complication: Secondary | ICD-10-CM | POA: Diagnosis not present

## 2021-09-07 DIAGNOSIS — D689 Coagulation defect, unspecified: Secondary | ICD-10-CM | POA: Diagnosis not present

## 2021-09-10 DIAGNOSIS — D689 Coagulation defect, unspecified: Secondary | ICD-10-CM | POA: Diagnosis not present

## 2021-09-10 DIAGNOSIS — N2581 Secondary hyperparathyroidism of renal origin: Secondary | ICD-10-CM | POA: Diagnosis not present

## 2021-09-10 DIAGNOSIS — N186 End stage renal disease: Secondary | ICD-10-CM | POA: Diagnosis not present

## 2021-09-10 DIAGNOSIS — Z992 Dependence on renal dialysis: Secondary | ICD-10-CM | POA: Diagnosis not present

## 2021-09-12 DIAGNOSIS — D689 Coagulation defect, unspecified: Secondary | ICD-10-CM | POA: Diagnosis not present

## 2021-09-12 DIAGNOSIS — N186 End stage renal disease: Secondary | ICD-10-CM | POA: Diagnosis not present

## 2021-09-12 DIAGNOSIS — N2581 Secondary hyperparathyroidism of renal origin: Secondary | ICD-10-CM | POA: Diagnosis not present

## 2021-09-12 DIAGNOSIS — Z992 Dependence on renal dialysis: Secondary | ICD-10-CM | POA: Diagnosis not present

## 2021-09-14 DIAGNOSIS — Z992 Dependence on renal dialysis: Secondary | ICD-10-CM | POA: Diagnosis not present

## 2021-09-14 DIAGNOSIS — N186 End stage renal disease: Secondary | ICD-10-CM | POA: Diagnosis not present

## 2021-09-14 DIAGNOSIS — D689 Coagulation defect, unspecified: Secondary | ICD-10-CM | POA: Diagnosis not present

## 2021-09-14 DIAGNOSIS — N2581 Secondary hyperparathyroidism of renal origin: Secondary | ICD-10-CM | POA: Diagnosis not present

## 2021-09-17 DIAGNOSIS — E1029 Type 1 diabetes mellitus with other diabetic kidney complication: Secondary | ICD-10-CM | POA: Diagnosis not present

## 2021-09-17 DIAGNOSIS — N2581 Secondary hyperparathyroidism of renal origin: Secondary | ICD-10-CM | POA: Diagnosis not present

## 2021-09-17 DIAGNOSIS — D689 Coagulation defect, unspecified: Secondary | ICD-10-CM | POA: Diagnosis not present

## 2021-09-17 DIAGNOSIS — Z992 Dependence on renal dialysis: Secondary | ICD-10-CM | POA: Diagnosis not present

## 2021-09-17 DIAGNOSIS — N186 End stage renal disease: Secondary | ICD-10-CM | POA: Diagnosis not present

## 2021-09-19 DIAGNOSIS — D689 Coagulation defect, unspecified: Secondary | ICD-10-CM | POA: Diagnosis not present

## 2021-09-19 DIAGNOSIS — N186 End stage renal disease: Secondary | ICD-10-CM | POA: Diagnosis not present

## 2021-09-19 DIAGNOSIS — N2581 Secondary hyperparathyroidism of renal origin: Secondary | ICD-10-CM | POA: Diagnosis not present

## 2021-09-19 DIAGNOSIS — Z992 Dependence on renal dialysis: Secondary | ICD-10-CM | POA: Diagnosis not present

## 2021-09-21 DIAGNOSIS — N2581 Secondary hyperparathyroidism of renal origin: Secondary | ICD-10-CM | POA: Diagnosis not present

## 2021-09-21 DIAGNOSIS — N186 End stage renal disease: Secondary | ICD-10-CM | POA: Diagnosis not present

## 2021-09-21 DIAGNOSIS — D689 Coagulation defect, unspecified: Secondary | ICD-10-CM | POA: Diagnosis not present

## 2021-09-21 DIAGNOSIS — Z992 Dependence on renal dialysis: Secondary | ICD-10-CM | POA: Diagnosis not present

## 2021-09-24 DIAGNOSIS — Z992 Dependence on renal dialysis: Secondary | ICD-10-CM | POA: Diagnosis not present

## 2021-09-24 DIAGNOSIS — N2581 Secondary hyperparathyroidism of renal origin: Secondary | ICD-10-CM | POA: Diagnosis not present

## 2021-09-24 DIAGNOSIS — N186 End stage renal disease: Secondary | ICD-10-CM | POA: Diagnosis not present

## 2021-09-24 DIAGNOSIS — D689 Coagulation defect, unspecified: Secondary | ICD-10-CM | POA: Diagnosis not present

## 2021-09-26 DIAGNOSIS — D689 Coagulation defect, unspecified: Secondary | ICD-10-CM | POA: Diagnosis not present

## 2021-09-26 DIAGNOSIS — N2581 Secondary hyperparathyroidism of renal origin: Secondary | ICD-10-CM | POA: Diagnosis not present

## 2021-09-26 DIAGNOSIS — N186 End stage renal disease: Secondary | ICD-10-CM | POA: Diagnosis not present

## 2021-09-26 DIAGNOSIS — Z992 Dependence on renal dialysis: Secondary | ICD-10-CM | POA: Diagnosis not present

## 2021-09-27 DIAGNOSIS — E1022 Type 1 diabetes mellitus with diabetic chronic kidney disease: Secondary | ICD-10-CM | POA: Diagnosis not present

## 2021-09-27 DIAGNOSIS — E103593 Type 1 diabetes mellitus with proliferative diabetic retinopathy without macular edema, bilateral: Secondary | ICD-10-CM | POA: Diagnosis not present

## 2021-09-27 DIAGNOSIS — Z9641 Presence of insulin pump (external) (internal): Secondary | ICD-10-CM | POA: Diagnosis not present

## 2021-09-27 DIAGNOSIS — Z992 Dependence on renal dialysis: Secondary | ICD-10-CM | POA: Diagnosis not present

## 2021-09-27 DIAGNOSIS — N186 End stage renal disease: Secondary | ICD-10-CM | POA: Diagnosis not present

## 2021-09-28 DIAGNOSIS — Z992 Dependence on renal dialysis: Secondary | ICD-10-CM | POA: Diagnosis not present

## 2021-09-28 DIAGNOSIS — D689 Coagulation defect, unspecified: Secondary | ICD-10-CM | POA: Diagnosis not present

## 2021-09-28 DIAGNOSIS — N186 End stage renal disease: Secondary | ICD-10-CM | POA: Diagnosis not present

## 2021-09-28 DIAGNOSIS — N2581 Secondary hyperparathyroidism of renal origin: Secondary | ICD-10-CM | POA: Diagnosis not present

## 2021-10-01 DIAGNOSIS — Z992 Dependence on renal dialysis: Secondary | ICD-10-CM | POA: Diagnosis not present

## 2021-10-01 DIAGNOSIS — D509 Iron deficiency anemia, unspecified: Secondary | ICD-10-CM | POA: Diagnosis not present

## 2021-10-01 DIAGNOSIS — E1029 Type 1 diabetes mellitus with other diabetic kidney complication: Secondary | ICD-10-CM | POA: Diagnosis not present

## 2021-10-01 DIAGNOSIS — N2581 Secondary hyperparathyroidism of renal origin: Secondary | ICD-10-CM | POA: Diagnosis not present

## 2021-10-01 DIAGNOSIS — N186 End stage renal disease: Secondary | ICD-10-CM | POA: Diagnosis not present

## 2021-10-01 DIAGNOSIS — D689 Coagulation defect, unspecified: Secondary | ICD-10-CM | POA: Diagnosis not present

## 2021-10-02 DIAGNOSIS — E1022 Type 1 diabetes mellitus with diabetic chronic kidney disease: Secondary | ICD-10-CM | POA: Diagnosis not present

## 2021-10-03 DIAGNOSIS — N2581 Secondary hyperparathyroidism of renal origin: Secondary | ICD-10-CM | POA: Diagnosis not present

## 2021-10-03 DIAGNOSIS — N186 End stage renal disease: Secondary | ICD-10-CM | POA: Diagnosis not present

## 2021-10-03 DIAGNOSIS — Z992 Dependence on renal dialysis: Secondary | ICD-10-CM | POA: Diagnosis not present

## 2021-10-03 DIAGNOSIS — E1029 Type 1 diabetes mellitus with other diabetic kidney complication: Secondary | ICD-10-CM | POA: Diagnosis not present

## 2021-10-03 DIAGNOSIS — D509 Iron deficiency anemia, unspecified: Secondary | ICD-10-CM | POA: Diagnosis not present

## 2021-10-03 DIAGNOSIS — D689 Coagulation defect, unspecified: Secondary | ICD-10-CM | POA: Diagnosis not present

## 2021-10-05 DIAGNOSIS — E1029 Type 1 diabetes mellitus with other diabetic kidney complication: Secondary | ICD-10-CM | POA: Diagnosis not present

## 2021-10-05 DIAGNOSIS — N186 End stage renal disease: Secondary | ICD-10-CM | POA: Diagnosis not present

## 2021-10-05 DIAGNOSIS — N2581 Secondary hyperparathyroidism of renal origin: Secondary | ICD-10-CM | POA: Diagnosis not present

## 2021-10-05 DIAGNOSIS — D689 Coagulation defect, unspecified: Secondary | ICD-10-CM | POA: Diagnosis not present

## 2021-10-05 DIAGNOSIS — D509 Iron deficiency anemia, unspecified: Secondary | ICD-10-CM | POA: Diagnosis not present

## 2021-10-05 DIAGNOSIS — Z992 Dependence on renal dialysis: Secondary | ICD-10-CM | POA: Diagnosis not present

## 2021-10-08 DIAGNOSIS — N2581 Secondary hyperparathyroidism of renal origin: Secondary | ICD-10-CM | POA: Diagnosis not present

## 2021-10-08 DIAGNOSIS — D689 Coagulation defect, unspecified: Secondary | ICD-10-CM | POA: Diagnosis not present

## 2021-10-08 DIAGNOSIS — N186 End stage renal disease: Secondary | ICD-10-CM | POA: Diagnosis not present

## 2021-10-08 DIAGNOSIS — Z992 Dependence on renal dialysis: Secondary | ICD-10-CM | POA: Diagnosis not present

## 2021-10-10 DIAGNOSIS — Z992 Dependence on renal dialysis: Secondary | ICD-10-CM | POA: Diagnosis not present

## 2021-10-10 DIAGNOSIS — N186 End stage renal disease: Secondary | ICD-10-CM | POA: Diagnosis not present

## 2021-10-10 DIAGNOSIS — N2581 Secondary hyperparathyroidism of renal origin: Secondary | ICD-10-CM | POA: Diagnosis not present

## 2021-10-10 DIAGNOSIS — D689 Coagulation defect, unspecified: Secondary | ICD-10-CM | POA: Diagnosis not present

## 2021-10-12 DIAGNOSIS — Z992 Dependence on renal dialysis: Secondary | ICD-10-CM | POA: Diagnosis not present

## 2021-10-12 DIAGNOSIS — D689 Coagulation defect, unspecified: Secondary | ICD-10-CM | POA: Diagnosis not present

## 2021-10-12 DIAGNOSIS — N2581 Secondary hyperparathyroidism of renal origin: Secondary | ICD-10-CM | POA: Diagnosis not present

## 2021-10-12 DIAGNOSIS — N186 End stage renal disease: Secondary | ICD-10-CM | POA: Diagnosis not present

## 2021-10-15 DIAGNOSIS — D631 Anemia in chronic kidney disease: Secondary | ICD-10-CM | POA: Diagnosis not present

## 2021-10-15 DIAGNOSIS — D689 Coagulation defect, unspecified: Secondary | ICD-10-CM | POA: Diagnosis not present

## 2021-10-15 DIAGNOSIS — Z992 Dependence on renal dialysis: Secondary | ICD-10-CM | POA: Diagnosis not present

## 2021-10-15 DIAGNOSIS — N186 End stage renal disease: Secondary | ICD-10-CM | POA: Diagnosis not present

## 2021-10-15 DIAGNOSIS — N2581 Secondary hyperparathyroidism of renal origin: Secondary | ICD-10-CM | POA: Diagnosis not present

## 2021-10-17 DIAGNOSIS — N186 End stage renal disease: Secondary | ICD-10-CM | POA: Diagnosis not present

## 2021-10-17 DIAGNOSIS — D689 Coagulation defect, unspecified: Secondary | ICD-10-CM | POA: Diagnosis not present

## 2021-10-17 DIAGNOSIS — D631 Anemia in chronic kidney disease: Secondary | ICD-10-CM | POA: Diagnosis not present

## 2021-10-17 DIAGNOSIS — Z992 Dependence on renal dialysis: Secondary | ICD-10-CM | POA: Diagnosis not present

## 2021-10-17 DIAGNOSIS — N2581 Secondary hyperparathyroidism of renal origin: Secondary | ICD-10-CM | POA: Diagnosis not present

## 2021-10-18 DIAGNOSIS — Z992 Dependence on renal dialysis: Secondary | ICD-10-CM | POA: Diagnosis not present

## 2021-10-18 DIAGNOSIS — N186 End stage renal disease: Secondary | ICD-10-CM | POA: Diagnosis not present

## 2021-10-18 DIAGNOSIS — E1029 Type 1 diabetes mellitus with other diabetic kidney complication: Secondary | ICD-10-CM | POA: Diagnosis not present

## 2021-10-19 DIAGNOSIS — D689 Coagulation defect, unspecified: Secondary | ICD-10-CM | POA: Diagnosis not present

## 2021-10-19 DIAGNOSIS — Z992 Dependence on renal dialysis: Secondary | ICD-10-CM | POA: Diagnosis not present

## 2021-10-19 DIAGNOSIS — N2581 Secondary hyperparathyroidism of renal origin: Secondary | ICD-10-CM | POA: Diagnosis not present

## 2021-10-19 DIAGNOSIS — N186 End stage renal disease: Secondary | ICD-10-CM | POA: Diagnosis not present

## 2021-10-22 DIAGNOSIS — D689 Coagulation defect, unspecified: Secondary | ICD-10-CM | POA: Diagnosis not present

## 2021-10-22 DIAGNOSIS — N2581 Secondary hyperparathyroidism of renal origin: Secondary | ICD-10-CM | POA: Diagnosis not present

## 2021-10-22 DIAGNOSIS — Z992 Dependence on renal dialysis: Secondary | ICD-10-CM | POA: Diagnosis not present

## 2021-10-22 DIAGNOSIS — N186 End stage renal disease: Secondary | ICD-10-CM | POA: Diagnosis not present

## 2021-10-24 DIAGNOSIS — D689 Coagulation defect, unspecified: Secondary | ICD-10-CM | POA: Diagnosis not present

## 2021-10-24 DIAGNOSIS — N2581 Secondary hyperparathyroidism of renal origin: Secondary | ICD-10-CM | POA: Diagnosis not present

## 2021-10-24 DIAGNOSIS — N186 End stage renal disease: Secondary | ICD-10-CM | POA: Diagnosis not present

## 2021-10-24 DIAGNOSIS — Z992 Dependence on renal dialysis: Secondary | ICD-10-CM | POA: Diagnosis not present

## 2021-10-26 DIAGNOSIS — N186 End stage renal disease: Secondary | ICD-10-CM | POA: Diagnosis not present

## 2021-10-26 DIAGNOSIS — Z992 Dependence on renal dialysis: Secondary | ICD-10-CM | POA: Diagnosis not present

## 2021-10-26 DIAGNOSIS — N2581 Secondary hyperparathyroidism of renal origin: Secondary | ICD-10-CM | POA: Diagnosis not present

## 2021-10-26 DIAGNOSIS — D689 Coagulation defect, unspecified: Secondary | ICD-10-CM | POA: Diagnosis not present

## 2021-10-29 DIAGNOSIS — N186 End stage renal disease: Secondary | ICD-10-CM | POA: Diagnosis not present

## 2021-10-29 DIAGNOSIS — Z992 Dependence on renal dialysis: Secondary | ICD-10-CM | POA: Diagnosis not present

## 2021-10-29 DIAGNOSIS — D689 Coagulation defect, unspecified: Secondary | ICD-10-CM | POA: Diagnosis not present

## 2021-10-29 DIAGNOSIS — N2581 Secondary hyperparathyroidism of renal origin: Secondary | ICD-10-CM | POA: Diagnosis not present

## 2021-10-31 DIAGNOSIS — Z992 Dependence on renal dialysis: Secondary | ICD-10-CM | POA: Diagnosis not present

## 2021-10-31 DIAGNOSIS — D689 Coagulation defect, unspecified: Secondary | ICD-10-CM | POA: Diagnosis not present

## 2021-10-31 DIAGNOSIS — N186 End stage renal disease: Secondary | ICD-10-CM | POA: Diagnosis not present

## 2021-10-31 DIAGNOSIS — N2581 Secondary hyperparathyroidism of renal origin: Secondary | ICD-10-CM | POA: Diagnosis not present

## 2021-11-01 DIAGNOSIS — E1022 Type 1 diabetes mellitus with diabetic chronic kidney disease: Secondary | ICD-10-CM | POA: Diagnosis not present

## 2021-11-02 DIAGNOSIS — Z992 Dependence on renal dialysis: Secondary | ICD-10-CM | POA: Diagnosis not present

## 2021-11-02 DIAGNOSIS — D689 Coagulation defect, unspecified: Secondary | ICD-10-CM | POA: Diagnosis not present

## 2021-11-02 DIAGNOSIS — N2581 Secondary hyperparathyroidism of renal origin: Secondary | ICD-10-CM | POA: Diagnosis not present

## 2021-11-02 DIAGNOSIS — N186 End stage renal disease: Secondary | ICD-10-CM | POA: Diagnosis not present

## 2021-11-05 DIAGNOSIS — D689 Coagulation defect, unspecified: Secondary | ICD-10-CM | POA: Diagnosis not present

## 2021-11-05 DIAGNOSIS — E1029 Type 1 diabetes mellitus with other diabetic kidney complication: Secondary | ICD-10-CM | POA: Diagnosis not present

## 2021-11-05 DIAGNOSIS — D631 Anemia in chronic kidney disease: Secondary | ICD-10-CM | POA: Diagnosis not present

## 2021-11-05 DIAGNOSIS — N186 End stage renal disease: Secondary | ICD-10-CM | POA: Diagnosis not present

## 2021-11-05 DIAGNOSIS — D509 Iron deficiency anemia, unspecified: Secondary | ICD-10-CM | POA: Diagnosis not present

## 2021-11-05 DIAGNOSIS — Z992 Dependence on renal dialysis: Secondary | ICD-10-CM | POA: Diagnosis not present

## 2021-11-05 DIAGNOSIS — N2581 Secondary hyperparathyroidism of renal origin: Secondary | ICD-10-CM | POA: Diagnosis not present

## 2021-11-07 DIAGNOSIS — D689 Coagulation defect, unspecified: Secondary | ICD-10-CM | POA: Diagnosis not present

## 2021-11-07 DIAGNOSIS — E1029 Type 1 diabetes mellitus with other diabetic kidney complication: Secondary | ICD-10-CM | POA: Diagnosis not present

## 2021-11-07 DIAGNOSIS — Z992 Dependence on renal dialysis: Secondary | ICD-10-CM | POA: Diagnosis not present

## 2021-11-07 DIAGNOSIS — N186 End stage renal disease: Secondary | ICD-10-CM | POA: Diagnosis not present

## 2021-11-07 DIAGNOSIS — D631 Anemia in chronic kidney disease: Secondary | ICD-10-CM | POA: Diagnosis not present

## 2021-11-07 DIAGNOSIS — D509 Iron deficiency anemia, unspecified: Secondary | ICD-10-CM | POA: Diagnosis not present

## 2021-11-07 DIAGNOSIS — N2581 Secondary hyperparathyroidism of renal origin: Secondary | ICD-10-CM | POA: Diagnosis not present

## 2021-11-09 DIAGNOSIS — E1029 Type 1 diabetes mellitus with other diabetic kidney complication: Secondary | ICD-10-CM | POA: Diagnosis not present

## 2021-11-09 DIAGNOSIS — D689 Coagulation defect, unspecified: Secondary | ICD-10-CM | POA: Diagnosis not present

## 2021-11-09 DIAGNOSIS — N186 End stage renal disease: Secondary | ICD-10-CM | POA: Diagnosis not present

## 2021-11-09 DIAGNOSIS — D509 Iron deficiency anemia, unspecified: Secondary | ICD-10-CM | POA: Diagnosis not present

## 2021-11-09 DIAGNOSIS — N2581 Secondary hyperparathyroidism of renal origin: Secondary | ICD-10-CM | POA: Diagnosis not present

## 2021-11-09 DIAGNOSIS — D631 Anemia in chronic kidney disease: Secondary | ICD-10-CM | POA: Diagnosis not present

## 2021-11-09 DIAGNOSIS — Z992 Dependence on renal dialysis: Secondary | ICD-10-CM | POA: Diagnosis not present

## 2021-11-12 DIAGNOSIS — N2581 Secondary hyperparathyroidism of renal origin: Secondary | ICD-10-CM | POA: Diagnosis not present

## 2021-11-12 DIAGNOSIS — D689 Coagulation defect, unspecified: Secondary | ICD-10-CM | POA: Diagnosis not present

## 2021-11-12 DIAGNOSIS — Z992 Dependence on renal dialysis: Secondary | ICD-10-CM | POA: Diagnosis not present

## 2021-11-12 DIAGNOSIS — N186 End stage renal disease: Secondary | ICD-10-CM | POA: Diagnosis not present

## 2021-11-14 DIAGNOSIS — N2581 Secondary hyperparathyroidism of renal origin: Secondary | ICD-10-CM | POA: Diagnosis not present

## 2021-11-14 DIAGNOSIS — N186 End stage renal disease: Secondary | ICD-10-CM | POA: Diagnosis not present

## 2021-11-14 DIAGNOSIS — Z992 Dependence on renal dialysis: Secondary | ICD-10-CM | POA: Diagnosis not present

## 2021-11-14 DIAGNOSIS — D689 Coagulation defect, unspecified: Secondary | ICD-10-CM | POA: Diagnosis not present

## 2021-11-16 DIAGNOSIS — D689 Coagulation defect, unspecified: Secondary | ICD-10-CM | POA: Diagnosis not present

## 2021-11-16 DIAGNOSIS — Z992 Dependence on renal dialysis: Secondary | ICD-10-CM | POA: Diagnosis not present

## 2021-11-16 DIAGNOSIS — N2581 Secondary hyperparathyroidism of renal origin: Secondary | ICD-10-CM | POA: Diagnosis not present

## 2021-11-16 DIAGNOSIS — N186 End stage renal disease: Secondary | ICD-10-CM | POA: Diagnosis not present

## 2021-11-17 DIAGNOSIS — N186 End stage renal disease: Secondary | ICD-10-CM | POA: Diagnosis not present

## 2021-11-17 DIAGNOSIS — Z992 Dependence on renal dialysis: Secondary | ICD-10-CM | POA: Diagnosis not present

## 2021-11-17 DIAGNOSIS — E1029 Type 1 diabetes mellitus with other diabetic kidney complication: Secondary | ICD-10-CM | POA: Diagnosis not present

## 2021-11-19 DIAGNOSIS — D631 Anemia in chronic kidney disease: Secondary | ICD-10-CM | POA: Diagnosis not present

## 2021-11-19 DIAGNOSIS — D689 Coagulation defect, unspecified: Secondary | ICD-10-CM | POA: Diagnosis not present

## 2021-11-19 DIAGNOSIS — N186 End stage renal disease: Secondary | ICD-10-CM | POA: Diagnosis not present

## 2021-11-19 DIAGNOSIS — Z992 Dependence on renal dialysis: Secondary | ICD-10-CM | POA: Diagnosis not present

## 2021-11-19 DIAGNOSIS — N2581 Secondary hyperparathyroidism of renal origin: Secondary | ICD-10-CM | POA: Diagnosis not present

## 2021-11-21 DIAGNOSIS — D631 Anemia in chronic kidney disease: Secondary | ICD-10-CM | POA: Diagnosis not present

## 2021-11-21 DIAGNOSIS — D689 Coagulation defect, unspecified: Secondary | ICD-10-CM | POA: Diagnosis not present

## 2021-11-21 DIAGNOSIS — N2581 Secondary hyperparathyroidism of renal origin: Secondary | ICD-10-CM | POA: Diagnosis not present

## 2021-11-21 DIAGNOSIS — N186 End stage renal disease: Secondary | ICD-10-CM | POA: Diagnosis not present

## 2021-11-21 DIAGNOSIS — Z992 Dependence on renal dialysis: Secondary | ICD-10-CM | POA: Diagnosis not present

## 2021-11-23 DIAGNOSIS — D631 Anemia in chronic kidney disease: Secondary | ICD-10-CM | POA: Diagnosis not present

## 2021-11-23 DIAGNOSIS — N2581 Secondary hyperparathyroidism of renal origin: Secondary | ICD-10-CM | POA: Diagnosis not present

## 2021-11-23 DIAGNOSIS — Z992 Dependence on renal dialysis: Secondary | ICD-10-CM | POA: Diagnosis not present

## 2021-11-23 DIAGNOSIS — D689 Coagulation defect, unspecified: Secondary | ICD-10-CM | POA: Diagnosis not present

## 2021-11-23 DIAGNOSIS — N186 End stage renal disease: Secondary | ICD-10-CM | POA: Diagnosis not present

## 2021-11-26 DIAGNOSIS — N2581 Secondary hyperparathyroidism of renal origin: Secondary | ICD-10-CM | POA: Diagnosis not present

## 2021-11-26 DIAGNOSIS — Z992 Dependence on renal dialysis: Secondary | ICD-10-CM | POA: Diagnosis not present

## 2021-11-26 DIAGNOSIS — N186 End stage renal disease: Secondary | ICD-10-CM | POA: Diagnosis not present

## 2021-11-26 DIAGNOSIS — D689 Coagulation defect, unspecified: Secondary | ICD-10-CM | POA: Diagnosis not present

## 2021-11-28 DIAGNOSIS — Z992 Dependence on renal dialysis: Secondary | ICD-10-CM | POA: Diagnosis not present

## 2021-11-28 DIAGNOSIS — D689 Coagulation defect, unspecified: Secondary | ICD-10-CM | POA: Diagnosis not present

## 2021-11-28 DIAGNOSIS — N186 End stage renal disease: Secondary | ICD-10-CM | POA: Diagnosis not present

## 2021-11-28 DIAGNOSIS — N2581 Secondary hyperparathyroidism of renal origin: Secondary | ICD-10-CM | POA: Diagnosis not present

## 2021-11-30 DIAGNOSIS — N2581 Secondary hyperparathyroidism of renal origin: Secondary | ICD-10-CM | POA: Diagnosis not present

## 2021-11-30 DIAGNOSIS — Z992 Dependence on renal dialysis: Secondary | ICD-10-CM | POA: Diagnosis not present

## 2021-11-30 DIAGNOSIS — N186 End stage renal disease: Secondary | ICD-10-CM | POA: Diagnosis not present

## 2021-11-30 DIAGNOSIS — D689 Coagulation defect, unspecified: Secondary | ICD-10-CM | POA: Diagnosis not present

## 2021-12-03 DIAGNOSIS — D631 Anemia in chronic kidney disease: Secondary | ICD-10-CM | POA: Diagnosis not present

## 2021-12-03 DIAGNOSIS — D509 Iron deficiency anemia, unspecified: Secondary | ICD-10-CM | POA: Diagnosis not present

## 2021-12-03 DIAGNOSIS — Z992 Dependence on renal dialysis: Secondary | ICD-10-CM | POA: Diagnosis not present

## 2021-12-03 DIAGNOSIS — N2581 Secondary hyperparathyroidism of renal origin: Secondary | ICD-10-CM | POA: Diagnosis not present

## 2021-12-03 DIAGNOSIS — E1029 Type 1 diabetes mellitus with other diabetic kidney complication: Secondary | ICD-10-CM | POA: Diagnosis not present

## 2021-12-03 DIAGNOSIS — N186 End stage renal disease: Secondary | ICD-10-CM | POA: Diagnosis not present

## 2021-12-03 DIAGNOSIS — D689 Coagulation defect, unspecified: Secondary | ICD-10-CM | POA: Diagnosis not present

## 2021-12-03 DIAGNOSIS — E1022 Type 1 diabetes mellitus with diabetic chronic kidney disease: Secondary | ICD-10-CM | POA: Diagnosis not present

## 2021-12-05 DIAGNOSIS — E1029 Type 1 diabetes mellitus with other diabetic kidney complication: Secondary | ICD-10-CM | POA: Diagnosis not present

## 2021-12-05 DIAGNOSIS — N2581 Secondary hyperparathyroidism of renal origin: Secondary | ICD-10-CM | POA: Diagnosis not present

## 2021-12-05 DIAGNOSIS — D509 Iron deficiency anemia, unspecified: Secondary | ICD-10-CM | POA: Diagnosis not present

## 2021-12-05 DIAGNOSIS — Z992 Dependence on renal dialysis: Secondary | ICD-10-CM | POA: Diagnosis not present

## 2021-12-05 DIAGNOSIS — D689 Coagulation defect, unspecified: Secondary | ICD-10-CM | POA: Diagnosis not present

## 2021-12-05 DIAGNOSIS — N186 End stage renal disease: Secondary | ICD-10-CM | POA: Diagnosis not present

## 2021-12-05 DIAGNOSIS — D631 Anemia in chronic kidney disease: Secondary | ICD-10-CM | POA: Diagnosis not present

## 2021-12-07 DIAGNOSIS — N186 End stage renal disease: Secondary | ICD-10-CM | POA: Diagnosis not present

## 2021-12-07 DIAGNOSIS — E1029 Type 1 diabetes mellitus with other diabetic kidney complication: Secondary | ICD-10-CM | POA: Diagnosis not present

## 2021-12-07 DIAGNOSIS — D509 Iron deficiency anemia, unspecified: Secondary | ICD-10-CM | POA: Diagnosis not present

## 2021-12-07 DIAGNOSIS — Z992 Dependence on renal dialysis: Secondary | ICD-10-CM | POA: Diagnosis not present

## 2021-12-07 DIAGNOSIS — D689 Coagulation defect, unspecified: Secondary | ICD-10-CM | POA: Diagnosis not present

## 2021-12-07 DIAGNOSIS — N2581 Secondary hyperparathyroidism of renal origin: Secondary | ICD-10-CM | POA: Diagnosis not present

## 2021-12-07 DIAGNOSIS — D631 Anemia in chronic kidney disease: Secondary | ICD-10-CM | POA: Diagnosis not present

## 2021-12-10 DIAGNOSIS — Z992 Dependence on renal dialysis: Secondary | ICD-10-CM | POA: Diagnosis not present

## 2021-12-10 DIAGNOSIS — N186 End stage renal disease: Secondary | ICD-10-CM | POA: Diagnosis not present

## 2021-12-10 DIAGNOSIS — D689 Coagulation defect, unspecified: Secondary | ICD-10-CM | POA: Diagnosis not present

## 2021-12-10 DIAGNOSIS — N2581 Secondary hyperparathyroidism of renal origin: Secondary | ICD-10-CM | POA: Diagnosis not present

## 2021-12-12 DIAGNOSIS — D689 Coagulation defect, unspecified: Secondary | ICD-10-CM | POA: Diagnosis not present

## 2021-12-12 DIAGNOSIS — N2581 Secondary hyperparathyroidism of renal origin: Secondary | ICD-10-CM | POA: Diagnosis not present

## 2021-12-12 DIAGNOSIS — Z992 Dependence on renal dialysis: Secondary | ICD-10-CM | POA: Diagnosis not present

## 2021-12-12 DIAGNOSIS — N186 End stage renal disease: Secondary | ICD-10-CM | POA: Diagnosis not present

## 2021-12-14 DIAGNOSIS — N186 End stage renal disease: Secondary | ICD-10-CM | POA: Diagnosis not present

## 2021-12-14 DIAGNOSIS — D689 Coagulation defect, unspecified: Secondary | ICD-10-CM | POA: Diagnosis not present

## 2021-12-14 DIAGNOSIS — N2581 Secondary hyperparathyroidism of renal origin: Secondary | ICD-10-CM | POA: Diagnosis not present

## 2021-12-14 DIAGNOSIS — Z992 Dependence on renal dialysis: Secondary | ICD-10-CM | POA: Diagnosis not present

## 2021-12-17 DIAGNOSIS — Z992 Dependence on renal dialysis: Secondary | ICD-10-CM | POA: Diagnosis not present

## 2021-12-17 DIAGNOSIS — N2581 Secondary hyperparathyroidism of renal origin: Secondary | ICD-10-CM | POA: Diagnosis not present

## 2021-12-17 DIAGNOSIS — D689 Coagulation defect, unspecified: Secondary | ICD-10-CM | POA: Diagnosis not present

## 2021-12-17 DIAGNOSIS — N186 End stage renal disease: Secondary | ICD-10-CM | POA: Diagnosis not present

## 2021-12-18 DIAGNOSIS — N186 End stage renal disease: Secondary | ICD-10-CM | POA: Diagnosis not present

## 2021-12-18 DIAGNOSIS — Z992 Dependence on renal dialysis: Secondary | ICD-10-CM | POA: Diagnosis not present

## 2021-12-18 DIAGNOSIS — E1029 Type 1 diabetes mellitus with other diabetic kidney complication: Secondary | ICD-10-CM | POA: Diagnosis not present

## 2021-12-19 DIAGNOSIS — N2581 Secondary hyperparathyroidism of renal origin: Secondary | ICD-10-CM | POA: Diagnosis not present

## 2021-12-19 DIAGNOSIS — Z992 Dependence on renal dialysis: Secondary | ICD-10-CM | POA: Diagnosis not present

## 2021-12-19 DIAGNOSIS — D631 Anemia in chronic kidney disease: Secondary | ICD-10-CM | POA: Diagnosis not present

## 2021-12-19 DIAGNOSIS — N186 End stage renal disease: Secondary | ICD-10-CM | POA: Diagnosis not present

## 2021-12-19 DIAGNOSIS — D689 Coagulation defect, unspecified: Secondary | ICD-10-CM | POA: Diagnosis not present

## 2021-12-21 ENCOUNTER — Encounter (HOSPITAL_BASED_OUTPATIENT_CLINIC_OR_DEPARTMENT_OTHER): Payer: Self-pay | Admitting: Emergency Medicine

## 2021-12-21 ENCOUNTER — Emergency Department (HOSPITAL_BASED_OUTPATIENT_CLINIC_OR_DEPARTMENT_OTHER)
Admission: EM | Admit: 2021-12-21 | Discharge: 2021-12-21 | Disposition: A | Discharge status: Home / Self Care | Payer: Medicare HMO | Attending: Emergency Medicine | Admitting: Emergency Medicine

## 2021-12-21 DIAGNOSIS — N2581 Secondary hyperparathyroidism of renal origin: Secondary | ICD-10-CM | POA: Diagnosis not present

## 2021-12-21 DIAGNOSIS — I132 Hypertensive heart and chronic kidney disease with heart failure and with stage 5 chronic kidney disease, or end stage renal disease: Secondary | ICD-10-CM | POA: Diagnosis not present

## 2021-12-21 DIAGNOSIS — D631 Anemia in chronic kidney disease: Secondary | ICD-10-CM | POA: Diagnosis not present

## 2021-12-21 DIAGNOSIS — M436 Torticollis: Secondary | ICD-10-CM | POA: Insufficient documentation

## 2021-12-21 DIAGNOSIS — E1022 Type 1 diabetes mellitus with diabetic chronic kidney disease: Secondary | ICD-10-CM | POA: Diagnosis not present

## 2021-12-21 DIAGNOSIS — N186 End stage renal disease: Secondary | ICD-10-CM | POA: Diagnosis not present

## 2021-12-21 DIAGNOSIS — I509 Heart failure, unspecified: Secondary | ICD-10-CM | POA: Insufficient documentation

## 2021-12-21 DIAGNOSIS — Z992 Dependence on renal dialysis: Secondary | ICD-10-CM | POA: Diagnosis not present

## 2021-12-21 DIAGNOSIS — Z79899 Other long term (current) drug therapy: Secondary | ICD-10-CM | POA: Diagnosis not present

## 2021-12-21 DIAGNOSIS — D689 Coagulation defect, unspecified: Secondary | ICD-10-CM | POA: Diagnosis not present

## 2021-12-21 LAB — CBG MONITORING, ED: Glucose-Capillary: 124 mg/dL — ABNORMAL HIGH (ref 70–99)

## 2021-12-21 MED ORDER — METHOCARBAMOL 500 MG PO TABS: 500.0000 mg | ORAL_TABLET | Freq: Two times a day (BID) | ORAL | 0 refills | Status: DC

## 2021-12-21 MED ORDER — LIDOCAINE 5 % EX PTCH
1.0000 | MEDICATED_PATCH | CUTANEOUS | Status: DC
  Administered 2021-12-21: 1 via TRANSDERMAL
  Filled 2021-12-21: qty 1

## 2021-12-21 NOTE — ED Triage Notes (Signed)
Neck pain and left side numbness, denies injury or Hx of same.

## 2021-12-21 NOTE — ED Provider Notes (Signed)
[facility] EMERGENCY DEPARTMENT Provider Note   CSN: [id number] Arrival date & time: 12/21/21  1527     History  Chief Complaint  Patient presents with   Torticollis    [name] is a 46 y.o. female.  HPI  Patient with complicated medical history including end-stage renal disease, CHF, type 1 diabetes, hypertension, hyperlipidemia presents today due to torticollis.  She specifically she states the night before yesterday she stayed up all night setting with her daughter.  She took a nap and woke up with the left side of her neck significantly painful.  It is a spasming pain, it comes and goes but is worse with any movement.  She denies any vision changes, headaches, syncope or presyncope, exertional component.  No history of aneurysms, she has not tried any over-the-counter medicine to help with the symptoms yet.  Denies any chest pain or shortness of breath, no extremity weakness.  The pain moves from the left ear down to her shoulder.  Home Medications Prior to Admission medications   Medication Sig Start Date End Date Taking? Authorizing Provider  amLODipine (NORVASC) 10 MG tablet Take 10 mg by mouth daily.    [provider]  calcium acetate (PHOSLO) 667 MG capsule Take 667 mg by mouth See admin instructions. Take 1 capsule (667 mg) by mouth with each meal & each snack 07/31/20   [provider]  chlorhexidine (PERIDEX) 0.12 % solution 15 mLs by Mouth Rinse route daily. 01/18/21   [provider]  Cinacalcet HCl (SENSIPAR PO) Take by mouth See admin instructions. Tuesday,thursday,saturday at dialysis 02/14/21 02/13/22  [provider]  Doxercalciferol ([place] IV) With dialysis 11/17/20 01/09/22  [provider]  GLUCAGON EMERGENCY 1 MG injection Inject 1 mg into the muscle once as needed (for severe low blood sugar).  11/22/12   [provider]  Insulin Human (INSULIN PUMP) SOLN Inject into the skin continuous. Novolog     [provider]  labetalol (NORMODYNE) 300 MG tablet Take 300 mg by mouth daily.    [provider]  loratadine (CLARITIN) 10 MG tablet Take 10 mg by mouth daily as needed for allergies.    [provider]  Methoxy PEG-Epoetin Beta (MIRCERA IJ) With dialysis 12/04/20 02/25/22  [provider]  NOVOLOG 100 UNIT/ML injection Inject into the skin See admin instructions. Via Insulin pump 10/10/20   [provider]  Pediatric Multivit-Minerals-C (FLINTSTONES GUMMIES PO) Take 2 tablets by mouth daily.    [provider]  rosuvastatin (CRESTOR) 10 MG tablet Take 10 mg by mouth at bedtime.    [provider]      Allergies    Pollen extract    Review of Systems   Review of Systems  Physical Exam Updated Vital Signs BP (!) 159/81 (BP Location: Left Arm)   Pulse 87   Temp 99.5 F (37.5 C) (Oral)   Resp (!) 22   Ht 5\' 3"  (1.6 m)   Wt 57.6 kg   LMP 12/21/2017   SpO2 96%   BMI 22.49 kg/m  Physical Exam Vitals and nursing note reviewed. Exam conducted with a chaperone present.  Constitutional:      Appearance: Normal appearance.  HENT:     Head: Normocephalic and atraumatic.  Eyes:     General: No scleral icterus.       Right eye: No discharge.        Left eye: No discharge.     Extraocular Movements: Extraocular  movements intact.     Pupils: Pupils are equal, round, and reactive to light.  Cardiovascular:     Rate and Rhythm: Normal rate and regular rhythm.     Pulses: Normal pulses.     Heart sounds: Normal heart sounds. No murmur heard.   No friction rub. No gallop.     Comments: No bruits auscultated Pulmonary:     Effort: Pulmonary effort is normal. No respiratory distress.     Breath sounds: Normal breath sounds.  Abdominal:     General: Abdomen is flat. Bowel sounds are normal. There is no distension.     Palpations: Abdomen is soft.     Tenderness: There is no abdominal tenderness.  Musculoskeletal:         General: Tenderness present.     Comments: Reproducible tenderness over the lateral aspect of left neck  Skin:    General: Skin is warm and dry.     Coloration: Skin is not jaundiced.  Neurological:     Mental Status: She is alert. Mental status is at baseline.     Coordination: Coordination normal.     Comments: Cranial nerves III through XII are grossly intact.  Grip strength is equal bilaterally, lower extremity strength equal bilaterally.  Sensation light touch grossly intact.  Negative Hoffmann sign.    ED Results / Procedures / Treatments   Labs (all labs ordered are listed, but only abnormal results are displayed) Labs Reviewed  CBG MONITORING, ED - Abnormal; Notable for the following components:      Result Value   Glucose-Capillary 124 (*)    All other components within normal limits    EKG None  Radiology No results found.  Procedures Procedures    Medications Ordered in ED Medications - No data to display  ED Course/ Medical Decision Making/ A&P                           Medical Decision Making  Patient presents with left neck pain/torticollis.  Differential includes CVA/TIA, carotid dissection, cord injury, MSK injury, radiculopathy.  On exam patient is neurovascularly intact.  Brisk cap refill negative Homans' sign.  There are no focal deficits.  The pain appears to be musculoskeletal given its reproducibility.  I discussed this case with attending who independently evaluated the patient who is in agreement this is MSK.  I considered CTA head and neck to better evaluate for aneurysm but based on the presentation and lack of systemic symptoms and exam I feel that is somewhat less likely.  We will prescribe Robaxin and have her follow-up with her PCP.  Return precaution discussed, discharged in stable condition.  Discussed HPI, physical exam and plan of care for this patient with attending Lajean Saver. The attending physician evaluated this patient as part of a  shared visit and agrees with plan of care.         Final Clinical Impression(s) / ED Diagnoses Final diagnoses:  None    Rx / DC Orders ED Discharge Orders     None         Sherrill Raring, Hershal Coria 12/21/21 2015    Lajean Saver, MD 12/21/21 2126

## 2021-12-21 NOTE — ED Notes (Signed)
Presents with left sided neck pain, heat pack provided. Has difficulty moving head to right and left, pain increases. Has taken 81mg  baby ASA at approx 0730hrs today. (Has Dialysis on Tue, Thurs and Sat)

## 2021-12-21 NOTE — Discharge Instructions (Addendum)
Please see your primary Monday or Tuesday for reevaluation if your symptoms persist.  If they change or worsen return back to the ED, specifically to have any vision changes, headache, feel faint or like you are going to pass out.  In the meantime he can take Tylenol for pain, you can also take the Robaxin

## 2021-12-24 DIAGNOSIS — Z992 Dependence on renal dialysis: Secondary | ICD-10-CM | POA: Diagnosis not present

## 2021-12-24 DIAGNOSIS — D689 Coagulation defect, unspecified: Secondary | ICD-10-CM | POA: Diagnosis not present

## 2021-12-24 DIAGNOSIS — N186 End stage renal disease: Secondary | ICD-10-CM | POA: Diagnosis not present

## 2021-12-24 DIAGNOSIS — N2581 Secondary hyperparathyroidism of renal origin: Secondary | ICD-10-CM | POA: Diagnosis not present

## 2021-12-26 DIAGNOSIS — N186 End stage renal disease: Secondary | ICD-10-CM | POA: Diagnosis not present

## 2021-12-26 DIAGNOSIS — Z992 Dependence on renal dialysis: Secondary | ICD-10-CM | POA: Diagnosis not present

## 2021-12-26 DIAGNOSIS — N2581 Secondary hyperparathyroidism of renal origin: Secondary | ICD-10-CM | POA: Diagnosis not present

## 2021-12-26 DIAGNOSIS — D689 Coagulation defect, unspecified: Secondary | ICD-10-CM | POA: Diagnosis not present

## 2021-12-28 DIAGNOSIS — N2581 Secondary hyperparathyroidism of renal origin: Secondary | ICD-10-CM | POA: Diagnosis not present

## 2021-12-28 DIAGNOSIS — N186 End stage renal disease: Secondary | ICD-10-CM | POA: Diagnosis not present

## 2021-12-28 DIAGNOSIS — Z992 Dependence on renal dialysis: Secondary | ICD-10-CM | POA: Diagnosis not present

## 2021-12-28 DIAGNOSIS — D689 Coagulation defect, unspecified: Secondary | ICD-10-CM | POA: Diagnosis not present

## 2021-12-30 DIAGNOSIS — E1022 Type 1 diabetes mellitus with diabetic chronic kidney disease: Secondary | ICD-10-CM | POA: Diagnosis not present

## 2021-12-30 DIAGNOSIS — E103593 Type 1 diabetes mellitus with proliferative diabetic retinopathy without macular edema, bilateral: Secondary | ICD-10-CM | POA: Diagnosis not present

## 2021-12-30 DIAGNOSIS — N186 End stage renal disease: Secondary | ICD-10-CM | POA: Diagnosis not present

## 2021-12-30 DIAGNOSIS — Z4681 Encounter for fitting and adjustment of insulin pump: Secondary | ICD-10-CM | POA: Diagnosis not present

## 2021-12-30 DIAGNOSIS — Z9641 Presence of insulin pump (external) (internal): Secondary | ICD-10-CM | POA: Diagnosis not present

## 2021-12-30 DIAGNOSIS — Z992 Dependence on renal dialysis: Secondary | ICD-10-CM | POA: Diagnosis not present

## 2021-12-31 DIAGNOSIS — D631 Anemia in chronic kidney disease: Secondary | ICD-10-CM | POA: Diagnosis not present

## 2021-12-31 DIAGNOSIS — D689 Coagulation defect, unspecified: Secondary | ICD-10-CM | POA: Diagnosis not present

## 2021-12-31 DIAGNOSIS — Z992 Dependence on renal dialysis: Secondary | ICD-10-CM | POA: Diagnosis not present

## 2021-12-31 DIAGNOSIS — N186 End stage renal disease: Secondary | ICD-10-CM | POA: Diagnosis not present

## 2021-12-31 DIAGNOSIS — N2581 Secondary hyperparathyroidism of renal origin: Secondary | ICD-10-CM | POA: Diagnosis not present

## 2022-01-02 DIAGNOSIS — D631 Anemia in chronic kidney disease: Secondary | ICD-10-CM | POA: Diagnosis not present

## 2022-01-02 DIAGNOSIS — D689 Coagulation defect, unspecified: Secondary | ICD-10-CM | POA: Diagnosis not present

## 2022-01-02 DIAGNOSIS — N186 End stage renal disease: Secondary | ICD-10-CM | POA: Diagnosis not present

## 2022-01-02 DIAGNOSIS — N2581 Secondary hyperparathyroidism of renal origin: Secondary | ICD-10-CM | POA: Diagnosis not present

## 2022-01-02 DIAGNOSIS — Z992 Dependence on renal dialysis: Secondary | ICD-10-CM | POA: Diagnosis not present

## 2022-01-04 DIAGNOSIS — N2581 Secondary hyperparathyroidism of renal origin: Secondary | ICD-10-CM | POA: Diagnosis not present

## 2022-01-04 DIAGNOSIS — D689 Coagulation defect, unspecified: Secondary | ICD-10-CM | POA: Diagnosis not present

## 2022-01-04 DIAGNOSIS — N186 End stage renal disease: Secondary | ICD-10-CM | POA: Diagnosis not present

## 2022-01-04 DIAGNOSIS — Z992 Dependence on renal dialysis: Secondary | ICD-10-CM | POA: Diagnosis not present

## 2022-01-04 DIAGNOSIS — D631 Anemia in chronic kidney disease: Secondary | ICD-10-CM | POA: Diagnosis not present

## 2022-01-07 DIAGNOSIS — E1029 Type 1 diabetes mellitus with other diabetic kidney complication: Secondary | ICD-10-CM | POA: Diagnosis not present

## 2022-01-07 DIAGNOSIS — N2581 Secondary hyperparathyroidism of renal origin: Secondary | ICD-10-CM | POA: Diagnosis not present

## 2022-01-07 DIAGNOSIS — N186 End stage renal disease: Secondary | ICD-10-CM | POA: Diagnosis not present

## 2022-01-07 DIAGNOSIS — Z992 Dependence on renal dialysis: Secondary | ICD-10-CM | POA: Diagnosis not present

## 2022-01-07 DIAGNOSIS — D509 Iron deficiency anemia, unspecified: Secondary | ICD-10-CM | POA: Diagnosis not present

## 2022-01-07 DIAGNOSIS — D689 Coagulation defect, unspecified: Secondary | ICD-10-CM | POA: Diagnosis not present

## 2022-01-08 DIAGNOSIS — D689 Coagulation defect, unspecified: Secondary | ICD-10-CM | POA: Diagnosis not present

## 2022-01-08 DIAGNOSIS — N2581 Secondary hyperparathyroidism of renal origin: Secondary | ICD-10-CM | POA: Diagnosis not present

## 2022-01-08 DIAGNOSIS — N186 End stage renal disease: Secondary | ICD-10-CM | POA: Diagnosis not present

## 2022-01-08 DIAGNOSIS — D509 Iron deficiency anemia, unspecified: Secondary | ICD-10-CM | POA: Diagnosis not present

## 2022-01-08 DIAGNOSIS — E1022 Type 1 diabetes mellitus with diabetic chronic kidney disease: Secondary | ICD-10-CM | POA: Diagnosis not present

## 2022-01-08 DIAGNOSIS — Z992 Dependence on renal dialysis: Secondary | ICD-10-CM | POA: Diagnosis not present

## 2022-01-08 DIAGNOSIS — E1029 Type 1 diabetes mellitus with other diabetic kidney complication: Secondary | ICD-10-CM | POA: Diagnosis not present

## 2022-01-09 DIAGNOSIS — N186 End stage renal disease: Secondary | ICD-10-CM | POA: Diagnosis not present

## 2022-01-09 DIAGNOSIS — Z992 Dependence on renal dialysis: Secondary | ICD-10-CM | POA: Diagnosis not present

## 2022-01-09 DIAGNOSIS — N2581 Secondary hyperparathyroidism of renal origin: Secondary | ICD-10-CM | POA: Diagnosis not present

## 2022-01-09 DIAGNOSIS — D689 Coagulation defect, unspecified: Secondary | ICD-10-CM | POA: Diagnosis not present

## 2022-01-09 DIAGNOSIS — E1029 Type 1 diabetes mellitus with other diabetic kidney complication: Secondary | ICD-10-CM | POA: Diagnosis not present

## 2022-01-09 DIAGNOSIS — D509 Iron deficiency anemia, unspecified: Secondary | ICD-10-CM | POA: Diagnosis not present

## 2022-01-11 DIAGNOSIS — N2581 Secondary hyperparathyroidism of renal origin: Secondary | ICD-10-CM | POA: Diagnosis not present

## 2022-01-11 DIAGNOSIS — E1029 Type 1 diabetes mellitus with other diabetic kidney complication: Secondary | ICD-10-CM | POA: Diagnosis not present

## 2022-01-11 DIAGNOSIS — N186 End stage renal disease: Secondary | ICD-10-CM | POA: Diagnosis not present

## 2022-01-11 DIAGNOSIS — D509 Iron deficiency anemia, unspecified: Secondary | ICD-10-CM | POA: Diagnosis not present

## 2022-01-11 DIAGNOSIS — D689 Coagulation defect, unspecified: Secondary | ICD-10-CM | POA: Diagnosis not present

## 2022-01-11 DIAGNOSIS — Z992 Dependence on renal dialysis: Secondary | ICD-10-CM | POA: Diagnosis not present

## 2022-01-14 DIAGNOSIS — D689 Coagulation defect, unspecified: Secondary | ICD-10-CM | POA: Diagnosis not present

## 2022-01-14 DIAGNOSIS — D631 Anemia in chronic kidney disease: Secondary | ICD-10-CM | POA: Diagnosis not present

## 2022-01-14 DIAGNOSIS — D509 Iron deficiency anemia, unspecified: Secondary | ICD-10-CM | POA: Diagnosis not present

## 2022-01-14 DIAGNOSIS — N186 End stage renal disease: Secondary | ICD-10-CM | POA: Diagnosis not present

## 2022-01-14 DIAGNOSIS — N2581 Secondary hyperparathyroidism of renal origin: Secondary | ICD-10-CM | POA: Diagnosis not present

## 2022-01-14 DIAGNOSIS — Z992 Dependence on renal dialysis: Secondary | ICD-10-CM | POA: Diagnosis not present

## 2022-01-15 DIAGNOSIS — E1022 Type 1 diabetes mellitus with diabetic chronic kidney disease: Secondary | ICD-10-CM | POA: Diagnosis not present

## 2022-01-16 DIAGNOSIS — D509 Iron deficiency anemia, unspecified: Secondary | ICD-10-CM | POA: Diagnosis not present

## 2022-01-16 DIAGNOSIS — N2581 Secondary hyperparathyroidism of renal origin: Secondary | ICD-10-CM | POA: Diagnosis not present

## 2022-01-16 DIAGNOSIS — D689 Coagulation defect, unspecified: Secondary | ICD-10-CM | POA: Diagnosis not present

## 2022-01-16 DIAGNOSIS — N186 End stage renal disease: Secondary | ICD-10-CM | POA: Diagnosis not present

## 2022-01-16 DIAGNOSIS — Z992 Dependence on renal dialysis: Secondary | ICD-10-CM | POA: Diagnosis not present

## 2022-01-16 DIAGNOSIS — D631 Anemia in chronic kidney disease: Secondary | ICD-10-CM | POA: Diagnosis not present

## 2022-01-17 DIAGNOSIS — Z992 Dependence on renal dialysis: Secondary | ICD-10-CM | POA: Diagnosis not present

## 2022-01-17 DIAGNOSIS — N186 End stage renal disease: Secondary | ICD-10-CM | POA: Diagnosis not present

## 2022-01-17 DIAGNOSIS — E1029 Type 1 diabetes mellitus with other diabetic kidney complication: Secondary | ICD-10-CM | POA: Diagnosis not present

## 2022-01-18 DIAGNOSIS — N2581 Secondary hyperparathyroidism of renal origin: Secondary | ICD-10-CM | POA: Diagnosis not present

## 2022-01-18 DIAGNOSIS — Z992 Dependence on renal dialysis: Secondary | ICD-10-CM | POA: Diagnosis not present

## 2022-01-18 DIAGNOSIS — D689 Coagulation defect, unspecified: Secondary | ICD-10-CM | POA: Diagnosis not present

## 2022-01-18 DIAGNOSIS — D509 Iron deficiency anemia, unspecified: Secondary | ICD-10-CM | POA: Diagnosis not present

## 2022-01-18 DIAGNOSIS — N186 End stage renal disease: Secondary | ICD-10-CM | POA: Diagnosis not present

## 2022-01-21 DIAGNOSIS — D509 Iron deficiency anemia, unspecified: Secondary | ICD-10-CM | POA: Diagnosis not present

## 2022-01-21 DIAGNOSIS — D689 Coagulation defect, unspecified: Secondary | ICD-10-CM | POA: Diagnosis not present

## 2022-01-21 DIAGNOSIS — N186 End stage renal disease: Secondary | ICD-10-CM | POA: Diagnosis not present

## 2022-01-21 DIAGNOSIS — Z992 Dependence on renal dialysis: Secondary | ICD-10-CM | POA: Diagnosis not present

## 2022-01-21 DIAGNOSIS — N2581 Secondary hyperparathyroidism of renal origin: Secondary | ICD-10-CM | POA: Diagnosis not present

## 2022-01-23 DIAGNOSIS — N2581 Secondary hyperparathyroidism of renal origin: Secondary | ICD-10-CM | POA: Diagnosis not present

## 2022-01-23 DIAGNOSIS — D509 Iron deficiency anemia, unspecified: Secondary | ICD-10-CM | POA: Diagnosis not present

## 2022-01-23 DIAGNOSIS — Z992 Dependence on renal dialysis: Secondary | ICD-10-CM | POA: Diagnosis not present

## 2022-01-23 DIAGNOSIS — N186 End stage renal disease: Secondary | ICD-10-CM | POA: Diagnosis not present

## 2022-01-23 DIAGNOSIS — D689 Coagulation defect, unspecified: Secondary | ICD-10-CM | POA: Diagnosis not present

## 2022-01-25 DIAGNOSIS — N2581 Secondary hyperparathyroidism of renal origin: Secondary | ICD-10-CM | POA: Diagnosis not present

## 2022-01-25 DIAGNOSIS — D509 Iron deficiency anemia, unspecified: Secondary | ICD-10-CM | POA: Diagnosis not present

## 2022-01-25 DIAGNOSIS — Z992 Dependence on renal dialysis: Secondary | ICD-10-CM | POA: Diagnosis not present

## 2022-01-25 DIAGNOSIS — D689 Coagulation defect, unspecified: Secondary | ICD-10-CM | POA: Diagnosis not present

## 2022-01-25 DIAGNOSIS — N186 End stage renal disease: Secondary | ICD-10-CM | POA: Diagnosis not present

## 2022-01-28 DIAGNOSIS — D689 Coagulation defect, unspecified: Secondary | ICD-10-CM | POA: Diagnosis not present

## 2022-01-28 DIAGNOSIS — D509 Iron deficiency anemia, unspecified: Secondary | ICD-10-CM | POA: Diagnosis not present

## 2022-01-28 DIAGNOSIS — N2581 Secondary hyperparathyroidism of renal origin: Secondary | ICD-10-CM | POA: Diagnosis not present

## 2022-01-28 DIAGNOSIS — Z992 Dependence on renal dialysis: Secondary | ICD-10-CM | POA: Diagnosis not present

## 2022-01-28 DIAGNOSIS — N186 End stage renal disease: Secondary | ICD-10-CM | POA: Diagnosis not present

## 2022-01-30 DIAGNOSIS — N2581 Secondary hyperparathyroidism of renal origin: Secondary | ICD-10-CM | POA: Diagnosis not present

## 2022-01-30 DIAGNOSIS — D689 Coagulation defect, unspecified: Secondary | ICD-10-CM | POA: Diagnosis not present

## 2022-01-30 DIAGNOSIS — D509 Iron deficiency anemia, unspecified: Secondary | ICD-10-CM | POA: Diagnosis not present

## 2022-01-30 DIAGNOSIS — N186 End stage renal disease: Secondary | ICD-10-CM | POA: Diagnosis not present

## 2022-01-30 DIAGNOSIS — Z992 Dependence on renal dialysis: Secondary | ICD-10-CM | POA: Diagnosis not present

## 2022-02-01 DIAGNOSIS — D509 Iron deficiency anemia, unspecified: Secondary | ICD-10-CM | POA: Diagnosis not present

## 2022-02-01 DIAGNOSIS — N2581 Secondary hyperparathyroidism of renal origin: Secondary | ICD-10-CM | POA: Diagnosis not present

## 2022-02-01 DIAGNOSIS — Z992 Dependence on renal dialysis: Secondary | ICD-10-CM | POA: Diagnosis not present

## 2022-02-01 DIAGNOSIS — N186 End stage renal disease: Secondary | ICD-10-CM | POA: Diagnosis not present

## 2022-02-01 DIAGNOSIS — D689 Coagulation defect, unspecified: Secondary | ICD-10-CM | POA: Diagnosis not present

## 2022-02-04 DIAGNOSIS — N2581 Secondary hyperparathyroidism of renal origin: Secondary | ICD-10-CM | POA: Diagnosis not present

## 2022-02-04 DIAGNOSIS — D631 Anemia in chronic kidney disease: Secondary | ICD-10-CM | POA: Diagnosis not present

## 2022-02-04 DIAGNOSIS — D509 Iron deficiency anemia, unspecified: Secondary | ICD-10-CM | POA: Diagnosis not present

## 2022-02-04 DIAGNOSIS — Z992 Dependence on renal dialysis: Secondary | ICD-10-CM | POA: Diagnosis not present

## 2022-02-04 DIAGNOSIS — D689 Coagulation defect, unspecified: Secondary | ICD-10-CM | POA: Diagnosis not present

## 2022-02-04 DIAGNOSIS — N186 End stage renal disease: Secondary | ICD-10-CM | POA: Diagnosis not present

## 2022-02-06 DIAGNOSIS — D631 Anemia in chronic kidney disease: Secondary | ICD-10-CM | POA: Diagnosis not present

## 2022-02-06 DIAGNOSIS — N2581 Secondary hyperparathyroidism of renal origin: Secondary | ICD-10-CM | POA: Diagnosis not present

## 2022-02-06 DIAGNOSIS — D689 Coagulation defect, unspecified: Secondary | ICD-10-CM | POA: Diagnosis not present

## 2022-02-06 DIAGNOSIS — Z992 Dependence on renal dialysis: Secondary | ICD-10-CM | POA: Diagnosis not present

## 2022-02-06 DIAGNOSIS — D509 Iron deficiency anemia, unspecified: Secondary | ICD-10-CM | POA: Diagnosis not present

## 2022-02-06 DIAGNOSIS — N186 End stage renal disease: Secondary | ICD-10-CM | POA: Diagnosis not present

## 2022-02-07 DIAGNOSIS — E1022 Type 1 diabetes mellitus with diabetic chronic kidney disease: Secondary | ICD-10-CM | POA: Diagnosis not present

## 2022-02-08 DIAGNOSIS — Z992 Dependence on renal dialysis: Secondary | ICD-10-CM | POA: Diagnosis not present

## 2022-02-08 DIAGNOSIS — D509 Iron deficiency anemia, unspecified: Secondary | ICD-10-CM | POA: Diagnosis not present

## 2022-02-08 DIAGNOSIS — N2581 Secondary hyperparathyroidism of renal origin: Secondary | ICD-10-CM | POA: Diagnosis not present

## 2022-02-08 DIAGNOSIS — N186 End stage renal disease: Secondary | ICD-10-CM | POA: Diagnosis not present

## 2022-02-08 DIAGNOSIS — D689 Coagulation defect, unspecified: Secondary | ICD-10-CM | POA: Diagnosis not present

## 2022-02-08 DIAGNOSIS — D631 Anemia in chronic kidney disease: Secondary | ICD-10-CM | POA: Diagnosis not present

## 2022-02-10 DIAGNOSIS — Z992 Dependence on renal dialysis: Secondary | ICD-10-CM | POA: Diagnosis not present

## 2022-02-10 DIAGNOSIS — N2581 Secondary hyperparathyroidism of renal origin: Secondary | ICD-10-CM | POA: Diagnosis not present

## 2022-02-10 DIAGNOSIS — D689 Coagulation defect, unspecified: Secondary | ICD-10-CM | POA: Diagnosis not present

## 2022-02-10 DIAGNOSIS — D509 Iron deficiency anemia, unspecified: Secondary | ICD-10-CM | POA: Diagnosis not present

## 2022-02-10 DIAGNOSIS — N186 End stage renal disease: Secondary | ICD-10-CM | POA: Diagnosis not present

## 2022-02-13 DIAGNOSIS — Z992 Dependence on renal dialysis: Secondary | ICD-10-CM | POA: Diagnosis not present

## 2022-02-13 DIAGNOSIS — D509 Iron deficiency anemia, unspecified: Secondary | ICD-10-CM | POA: Diagnosis not present

## 2022-02-13 DIAGNOSIS — N186 End stage renal disease: Secondary | ICD-10-CM | POA: Diagnosis not present

## 2022-02-13 DIAGNOSIS — D689 Coagulation defect, unspecified: Secondary | ICD-10-CM | POA: Diagnosis not present

## 2022-02-13 DIAGNOSIS — N2581 Secondary hyperparathyroidism of renal origin: Secondary | ICD-10-CM | POA: Diagnosis not present

## 2022-02-15 DIAGNOSIS — D689 Coagulation defect, unspecified: Secondary | ICD-10-CM | POA: Diagnosis not present

## 2022-02-15 DIAGNOSIS — D509 Iron deficiency anemia, unspecified: Secondary | ICD-10-CM | POA: Diagnosis not present

## 2022-02-15 DIAGNOSIS — Z992 Dependence on renal dialysis: Secondary | ICD-10-CM | POA: Diagnosis not present

## 2022-02-15 DIAGNOSIS — N2581 Secondary hyperparathyroidism of renal origin: Secondary | ICD-10-CM | POA: Diagnosis not present

## 2022-02-15 DIAGNOSIS — N186 End stage renal disease: Secondary | ICD-10-CM | POA: Diagnosis not present

## 2022-02-17 DIAGNOSIS — Z992 Dependence on renal dialysis: Secondary | ICD-10-CM | POA: Diagnosis not present

## 2022-02-17 DIAGNOSIS — N2581 Secondary hyperparathyroidism of renal origin: Secondary | ICD-10-CM | POA: Diagnosis not present

## 2022-02-17 DIAGNOSIS — E1029 Type 1 diabetes mellitus with other diabetic kidney complication: Secondary | ICD-10-CM | POA: Diagnosis not present

## 2022-02-17 DIAGNOSIS — D689 Coagulation defect, unspecified: Secondary | ICD-10-CM | POA: Diagnosis not present

## 2022-02-17 DIAGNOSIS — N186 End stage renal disease: Secondary | ICD-10-CM | POA: Diagnosis not present

## 2022-02-20 DIAGNOSIS — N186 End stage renal disease: Secondary | ICD-10-CM | POA: Diagnosis not present

## 2022-02-20 DIAGNOSIS — D509 Iron deficiency anemia, unspecified: Secondary | ICD-10-CM | POA: Diagnosis not present

## 2022-02-20 DIAGNOSIS — N2581 Secondary hyperparathyroidism of renal origin: Secondary | ICD-10-CM | POA: Diagnosis not present

## 2022-02-20 DIAGNOSIS — D631 Anemia in chronic kidney disease: Secondary | ICD-10-CM | POA: Diagnosis not present

## 2022-02-20 DIAGNOSIS — D689 Coagulation defect, unspecified: Secondary | ICD-10-CM | POA: Diagnosis not present

## 2022-02-20 DIAGNOSIS — Z992 Dependence on renal dialysis: Secondary | ICD-10-CM | POA: Diagnosis not present

## 2022-02-22 DIAGNOSIS — D509 Iron deficiency anemia, unspecified: Secondary | ICD-10-CM | POA: Diagnosis not present

## 2022-02-22 DIAGNOSIS — Z992 Dependence on renal dialysis: Secondary | ICD-10-CM | POA: Diagnosis not present

## 2022-02-22 DIAGNOSIS — D689 Coagulation defect, unspecified: Secondary | ICD-10-CM | POA: Diagnosis not present

## 2022-02-22 DIAGNOSIS — D631 Anemia in chronic kidney disease: Secondary | ICD-10-CM | POA: Diagnosis not present

## 2022-02-22 DIAGNOSIS — N186 End stage renal disease: Secondary | ICD-10-CM | POA: Diagnosis not present

## 2022-02-22 DIAGNOSIS — N2581 Secondary hyperparathyroidism of renal origin: Secondary | ICD-10-CM | POA: Diagnosis not present

## 2022-02-25 DIAGNOSIS — D689 Coagulation defect, unspecified: Secondary | ICD-10-CM | POA: Diagnosis not present

## 2022-02-25 DIAGNOSIS — N186 End stage renal disease: Secondary | ICD-10-CM | POA: Diagnosis not present

## 2022-02-25 DIAGNOSIS — N2581 Secondary hyperparathyroidism of renal origin: Secondary | ICD-10-CM | POA: Diagnosis not present

## 2022-02-25 DIAGNOSIS — Z992 Dependence on renal dialysis: Secondary | ICD-10-CM | POA: Diagnosis not present

## 2022-02-25 DIAGNOSIS — D509 Iron deficiency anemia, unspecified: Secondary | ICD-10-CM | POA: Diagnosis not present

## 2022-02-27 DIAGNOSIS — N186 End stage renal disease: Secondary | ICD-10-CM | POA: Diagnosis not present

## 2022-02-27 DIAGNOSIS — Z992 Dependence on renal dialysis: Secondary | ICD-10-CM | POA: Diagnosis not present

## 2022-02-27 DIAGNOSIS — D509 Iron deficiency anemia, unspecified: Secondary | ICD-10-CM | POA: Diagnosis not present

## 2022-02-27 DIAGNOSIS — N2581 Secondary hyperparathyroidism of renal origin: Secondary | ICD-10-CM | POA: Diagnosis not present

## 2022-02-27 DIAGNOSIS — D689 Coagulation defect, unspecified: Secondary | ICD-10-CM | POA: Diagnosis not present

## 2022-03-01 DIAGNOSIS — N186 End stage renal disease: Secondary | ICD-10-CM | POA: Diagnosis not present

## 2022-03-01 DIAGNOSIS — D689 Coagulation defect, unspecified: Secondary | ICD-10-CM | POA: Diagnosis not present

## 2022-03-01 DIAGNOSIS — D509 Iron deficiency anemia, unspecified: Secondary | ICD-10-CM | POA: Diagnosis not present

## 2022-03-01 DIAGNOSIS — N2581 Secondary hyperparathyroidism of renal origin: Secondary | ICD-10-CM | POA: Diagnosis not present

## 2022-03-01 DIAGNOSIS — Z992 Dependence on renal dialysis: Secondary | ICD-10-CM | POA: Diagnosis not present

## 2022-03-04 DIAGNOSIS — D689 Coagulation defect, unspecified: Secondary | ICD-10-CM | POA: Diagnosis not present

## 2022-03-04 DIAGNOSIS — D631 Anemia in chronic kidney disease: Secondary | ICD-10-CM | POA: Diagnosis not present

## 2022-03-04 DIAGNOSIS — N186 End stage renal disease: Secondary | ICD-10-CM | POA: Diagnosis not present

## 2022-03-04 DIAGNOSIS — Z992 Dependence on renal dialysis: Secondary | ICD-10-CM | POA: Diagnosis not present

## 2022-03-04 DIAGNOSIS — E1029 Type 1 diabetes mellitus with other diabetic kidney complication: Secondary | ICD-10-CM | POA: Diagnosis not present

## 2022-03-04 DIAGNOSIS — N2581 Secondary hyperparathyroidism of renal origin: Secondary | ICD-10-CM | POA: Diagnosis not present

## 2022-03-04 DIAGNOSIS — D509 Iron deficiency anemia, unspecified: Secondary | ICD-10-CM | POA: Diagnosis not present

## 2022-03-06 DIAGNOSIS — N186 End stage renal disease: Secondary | ICD-10-CM | POA: Diagnosis not present

## 2022-03-06 DIAGNOSIS — E1029 Type 1 diabetes mellitus with other diabetic kidney complication: Secondary | ICD-10-CM | POA: Diagnosis not present

## 2022-03-06 DIAGNOSIS — Z992 Dependence on renal dialysis: Secondary | ICD-10-CM | POA: Diagnosis not present

## 2022-03-06 DIAGNOSIS — N2581 Secondary hyperparathyroidism of renal origin: Secondary | ICD-10-CM | POA: Diagnosis not present

## 2022-03-06 DIAGNOSIS — D689 Coagulation defect, unspecified: Secondary | ICD-10-CM | POA: Diagnosis not present

## 2022-03-06 DIAGNOSIS — D509 Iron deficiency anemia, unspecified: Secondary | ICD-10-CM | POA: Diagnosis not present

## 2022-03-06 DIAGNOSIS — D631 Anemia in chronic kidney disease: Secondary | ICD-10-CM | POA: Diagnosis not present

## 2022-03-08 DIAGNOSIS — N186 End stage renal disease: Secondary | ICD-10-CM | POA: Diagnosis not present

## 2022-03-08 DIAGNOSIS — Z992 Dependence on renal dialysis: Secondary | ICD-10-CM | POA: Diagnosis not present

## 2022-03-08 DIAGNOSIS — E1029 Type 1 diabetes mellitus with other diabetic kidney complication: Secondary | ICD-10-CM | POA: Diagnosis not present

## 2022-03-08 DIAGNOSIS — D689 Coagulation defect, unspecified: Secondary | ICD-10-CM | POA: Diagnosis not present

## 2022-03-08 DIAGNOSIS — D631 Anemia in chronic kidney disease: Secondary | ICD-10-CM | POA: Diagnosis not present

## 2022-03-08 DIAGNOSIS — D509 Iron deficiency anemia, unspecified: Secondary | ICD-10-CM | POA: Diagnosis not present

## 2022-03-08 DIAGNOSIS — N2581 Secondary hyperparathyroidism of renal origin: Secondary | ICD-10-CM | POA: Diagnosis not present

## 2022-03-11 DIAGNOSIS — N186 End stage renal disease: Secondary | ICD-10-CM | POA: Diagnosis not present

## 2022-03-11 DIAGNOSIS — R110 Nausea: Secondary | ICD-10-CM | POA: Diagnosis not present

## 2022-03-11 DIAGNOSIS — Z992 Dependence on renal dialysis: Secondary | ICD-10-CM | POA: Diagnosis not present

## 2022-03-11 DIAGNOSIS — N2581 Secondary hyperparathyroidism of renal origin: Secondary | ICD-10-CM | POA: Diagnosis not present

## 2022-03-11 DIAGNOSIS — D689 Coagulation defect, unspecified: Secondary | ICD-10-CM | POA: Diagnosis not present

## 2022-03-11 DIAGNOSIS — D509 Iron deficiency anemia, unspecified: Secondary | ICD-10-CM | POA: Diagnosis not present

## 2022-03-11 DIAGNOSIS — D631 Anemia in chronic kidney disease: Secondary | ICD-10-CM | POA: Diagnosis not present

## 2022-03-13 DIAGNOSIS — N186 End stage renal disease: Secondary | ICD-10-CM | POA: Diagnosis not present

## 2022-03-13 DIAGNOSIS — Z992 Dependence on renal dialysis: Secondary | ICD-10-CM | POA: Diagnosis not present

## 2022-03-13 DIAGNOSIS — N2581 Secondary hyperparathyroidism of renal origin: Secondary | ICD-10-CM | POA: Diagnosis not present

## 2022-03-13 DIAGNOSIS — D631 Anemia in chronic kidney disease: Secondary | ICD-10-CM | POA: Diagnosis not present

## 2022-03-13 DIAGNOSIS — D689 Coagulation defect, unspecified: Secondary | ICD-10-CM | POA: Diagnosis not present

## 2022-03-13 DIAGNOSIS — R110 Nausea: Secondary | ICD-10-CM | POA: Diagnosis not present

## 2022-03-13 DIAGNOSIS — D509 Iron deficiency anemia, unspecified: Secondary | ICD-10-CM | POA: Diagnosis not present

## 2022-03-15 DIAGNOSIS — R110 Nausea: Secondary | ICD-10-CM | POA: Diagnosis not present

## 2022-03-15 DIAGNOSIS — D689 Coagulation defect, unspecified: Secondary | ICD-10-CM | POA: Diagnosis not present

## 2022-03-15 DIAGNOSIS — D509 Iron deficiency anemia, unspecified: Secondary | ICD-10-CM | POA: Diagnosis not present

## 2022-03-15 DIAGNOSIS — N186 End stage renal disease: Secondary | ICD-10-CM | POA: Diagnosis not present

## 2022-03-15 DIAGNOSIS — N2581 Secondary hyperparathyroidism of renal origin: Secondary | ICD-10-CM | POA: Diagnosis not present

## 2022-03-15 DIAGNOSIS — Z992 Dependence on renal dialysis: Secondary | ICD-10-CM | POA: Diagnosis not present

## 2022-03-15 DIAGNOSIS — D631 Anemia in chronic kidney disease: Secondary | ICD-10-CM | POA: Diagnosis not present

## 2022-03-18 DIAGNOSIS — D689 Coagulation defect, unspecified: Secondary | ICD-10-CM | POA: Diagnosis not present

## 2022-03-18 DIAGNOSIS — D509 Iron deficiency anemia, unspecified: Secondary | ICD-10-CM | POA: Diagnosis not present

## 2022-03-18 DIAGNOSIS — N2581 Secondary hyperparathyroidism of renal origin: Secondary | ICD-10-CM | POA: Diagnosis not present

## 2022-03-18 DIAGNOSIS — N186 End stage renal disease: Secondary | ICD-10-CM | POA: Diagnosis not present

## 2022-03-18 DIAGNOSIS — Z992 Dependence on renal dialysis: Secondary | ICD-10-CM | POA: Diagnosis not present

## 2022-03-19 DIAGNOSIS — E1022 Type 1 diabetes mellitus with diabetic chronic kidney disease: Secondary | ICD-10-CM | POA: Diagnosis not present

## 2022-03-20 DIAGNOSIS — N186 End stage renal disease: Secondary | ICD-10-CM | POA: Diagnosis not present

## 2022-03-20 DIAGNOSIS — E1029 Type 1 diabetes mellitus with other diabetic kidney complication: Secondary | ICD-10-CM | POA: Diagnosis not present

## 2022-03-20 DIAGNOSIS — D509 Iron deficiency anemia, unspecified: Secondary | ICD-10-CM | POA: Diagnosis not present

## 2022-03-20 DIAGNOSIS — N2581 Secondary hyperparathyroidism of renal origin: Secondary | ICD-10-CM | POA: Diagnosis not present

## 2022-03-20 DIAGNOSIS — Z992 Dependence on renal dialysis: Secondary | ICD-10-CM | POA: Diagnosis not present

## 2022-03-20 DIAGNOSIS — D689 Coagulation defect, unspecified: Secondary | ICD-10-CM | POA: Diagnosis not present

## 2022-03-22 DIAGNOSIS — Z992 Dependence on renal dialysis: Secondary | ICD-10-CM | POA: Diagnosis not present

## 2022-03-22 DIAGNOSIS — D509 Iron deficiency anemia, unspecified: Secondary | ICD-10-CM | POA: Diagnosis not present

## 2022-03-22 DIAGNOSIS — D689 Coagulation defect, unspecified: Secondary | ICD-10-CM | POA: Diagnosis not present

## 2022-03-22 DIAGNOSIS — N2581 Secondary hyperparathyroidism of renal origin: Secondary | ICD-10-CM | POA: Diagnosis not present

## 2022-03-22 DIAGNOSIS — N186 End stage renal disease: Secondary | ICD-10-CM | POA: Diagnosis not present

## 2022-03-25 DIAGNOSIS — D631 Anemia in chronic kidney disease: Secondary | ICD-10-CM | POA: Diagnosis not present

## 2022-03-25 DIAGNOSIS — D509 Iron deficiency anemia, unspecified: Secondary | ICD-10-CM | POA: Diagnosis not present

## 2022-03-25 DIAGNOSIS — N186 End stage renal disease: Secondary | ICD-10-CM | POA: Diagnosis not present

## 2022-03-25 DIAGNOSIS — N2581 Secondary hyperparathyroidism of renal origin: Secondary | ICD-10-CM | POA: Diagnosis not present

## 2022-03-25 DIAGNOSIS — Z992 Dependence on renal dialysis: Secondary | ICD-10-CM | POA: Diagnosis not present

## 2022-03-25 DIAGNOSIS — D689 Coagulation defect, unspecified: Secondary | ICD-10-CM | POA: Diagnosis not present

## 2022-03-25 DIAGNOSIS — R739 Hyperglycemia, unspecified: Secondary | ICD-10-CM | POA: Diagnosis not present

## 2022-03-27 DIAGNOSIS — Z992 Dependence on renal dialysis: Secondary | ICD-10-CM | POA: Diagnosis not present

## 2022-03-27 DIAGNOSIS — D631 Anemia in chronic kidney disease: Secondary | ICD-10-CM | POA: Diagnosis not present

## 2022-03-27 DIAGNOSIS — N186 End stage renal disease: Secondary | ICD-10-CM | POA: Diagnosis not present

## 2022-03-27 DIAGNOSIS — R739 Hyperglycemia, unspecified: Secondary | ICD-10-CM | POA: Diagnosis not present

## 2022-03-27 DIAGNOSIS — D509 Iron deficiency anemia, unspecified: Secondary | ICD-10-CM | POA: Diagnosis not present

## 2022-03-27 DIAGNOSIS — D689 Coagulation defect, unspecified: Secondary | ICD-10-CM | POA: Diagnosis not present

## 2022-03-27 DIAGNOSIS — N2581 Secondary hyperparathyroidism of renal origin: Secondary | ICD-10-CM | POA: Diagnosis not present

## 2022-03-29 DIAGNOSIS — D509 Iron deficiency anemia, unspecified: Secondary | ICD-10-CM | POA: Diagnosis not present

## 2022-03-29 DIAGNOSIS — N2581 Secondary hyperparathyroidism of renal origin: Secondary | ICD-10-CM | POA: Diagnosis not present

## 2022-03-29 DIAGNOSIS — D631 Anemia in chronic kidney disease: Secondary | ICD-10-CM | POA: Diagnosis not present

## 2022-03-29 DIAGNOSIS — D689 Coagulation defect, unspecified: Secondary | ICD-10-CM | POA: Diagnosis not present

## 2022-03-29 DIAGNOSIS — Z992 Dependence on renal dialysis: Secondary | ICD-10-CM | POA: Diagnosis not present

## 2022-03-29 DIAGNOSIS — R739 Hyperglycemia, unspecified: Secondary | ICD-10-CM | POA: Diagnosis not present

## 2022-03-29 DIAGNOSIS — N186 End stage renal disease: Secondary | ICD-10-CM | POA: Diagnosis not present

## 2022-04-01 DIAGNOSIS — D689 Coagulation defect, unspecified: Secondary | ICD-10-CM | POA: Diagnosis not present

## 2022-04-01 DIAGNOSIS — Z992 Dependence on renal dialysis: Secondary | ICD-10-CM | POA: Diagnosis not present

## 2022-04-01 DIAGNOSIS — N186 End stage renal disease: Secondary | ICD-10-CM | POA: Diagnosis not present

## 2022-04-01 DIAGNOSIS — D509 Iron deficiency anemia, unspecified: Secondary | ICD-10-CM | POA: Diagnosis not present

## 2022-04-01 DIAGNOSIS — N2581 Secondary hyperparathyroidism of renal origin: Secondary | ICD-10-CM | POA: Diagnosis not present

## 2022-04-02 DIAGNOSIS — Z992 Dependence on renal dialysis: Secondary | ICD-10-CM | POA: Diagnosis not present

## 2022-04-02 DIAGNOSIS — E103593 Type 1 diabetes mellitus with proliferative diabetic retinopathy without macular edema, bilateral: Secondary | ICD-10-CM | POA: Diagnosis not present

## 2022-04-02 DIAGNOSIS — N186 End stage renal disease: Secondary | ICD-10-CM | POA: Diagnosis not present

## 2022-04-02 DIAGNOSIS — E1022 Type 1 diabetes mellitus with diabetic chronic kidney disease: Secondary | ICD-10-CM | POA: Diagnosis not present

## 2022-04-02 DIAGNOSIS — Z9641 Presence of insulin pump (external) (internal): Secondary | ICD-10-CM | POA: Diagnosis not present

## 2022-04-02 DIAGNOSIS — Z4681 Encounter for fitting and adjustment of insulin pump: Secondary | ICD-10-CM | POA: Diagnosis not present

## 2022-04-03 DIAGNOSIS — Z992 Dependence on renal dialysis: Secondary | ICD-10-CM | POA: Diagnosis not present

## 2022-04-03 DIAGNOSIS — D689 Coagulation defect, unspecified: Secondary | ICD-10-CM | POA: Diagnosis not present

## 2022-04-03 DIAGNOSIS — D509 Iron deficiency anemia, unspecified: Secondary | ICD-10-CM | POA: Diagnosis not present

## 2022-04-03 DIAGNOSIS — N2581 Secondary hyperparathyroidism of renal origin: Secondary | ICD-10-CM | POA: Diagnosis not present

## 2022-04-03 DIAGNOSIS — N186 End stage renal disease: Secondary | ICD-10-CM | POA: Diagnosis not present

## 2022-04-05 DIAGNOSIS — Z992 Dependence on renal dialysis: Secondary | ICD-10-CM | POA: Diagnosis not present

## 2022-04-05 DIAGNOSIS — D509 Iron deficiency anemia, unspecified: Secondary | ICD-10-CM | POA: Diagnosis not present

## 2022-04-05 DIAGNOSIS — N186 End stage renal disease: Secondary | ICD-10-CM | POA: Diagnosis not present

## 2022-04-05 DIAGNOSIS — D689 Coagulation defect, unspecified: Secondary | ICD-10-CM | POA: Diagnosis not present

## 2022-04-05 DIAGNOSIS — N2581 Secondary hyperparathyroidism of renal origin: Secondary | ICD-10-CM | POA: Diagnosis not present

## 2022-04-08 DIAGNOSIS — D631 Anemia in chronic kidney disease: Secondary | ICD-10-CM | POA: Diagnosis not present

## 2022-04-08 DIAGNOSIS — D509 Iron deficiency anemia, unspecified: Secondary | ICD-10-CM | POA: Diagnosis not present

## 2022-04-08 DIAGNOSIS — Z992 Dependence on renal dialysis: Secondary | ICD-10-CM | POA: Diagnosis not present

## 2022-04-08 DIAGNOSIS — N186 End stage renal disease: Secondary | ICD-10-CM | POA: Diagnosis not present

## 2022-04-08 DIAGNOSIS — D689 Coagulation defect, unspecified: Secondary | ICD-10-CM | POA: Diagnosis not present

## 2022-04-08 DIAGNOSIS — E1029 Type 1 diabetes mellitus with other diabetic kidney complication: Secondary | ICD-10-CM | POA: Diagnosis not present

## 2022-04-08 DIAGNOSIS — N2581 Secondary hyperparathyroidism of renal origin: Secondary | ICD-10-CM | POA: Diagnosis not present

## 2022-04-10 DIAGNOSIS — D631 Anemia in chronic kidney disease: Secondary | ICD-10-CM | POA: Diagnosis not present

## 2022-04-10 DIAGNOSIS — N186 End stage renal disease: Secondary | ICD-10-CM | POA: Diagnosis not present

## 2022-04-10 DIAGNOSIS — D689 Coagulation defect, unspecified: Secondary | ICD-10-CM | POA: Diagnosis not present

## 2022-04-10 DIAGNOSIS — N2581 Secondary hyperparathyroidism of renal origin: Secondary | ICD-10-CM | POA: Diagnosis not present

## 2022-04-10 DIAGNOSIS — Z992 Dependence on renal dialysis: Secondary | ICD-10-CM | POA: Diagnosis not present

## 2022-04-10 DIAGNOSIS — E1029 Type 1 diabetes mellitus with other diabetic kidney complication: Secondary | ICD-10-CM | POA: Diagnosis not present

## 2022-04-10 DIAGNOSIS — D509 Iron deficiency anemia, unspecified: Secondary | ICD-10-CM | POA: Diagnosis not present

## 2022-04-12 DIAGNOSIS — N2581 Secondary hyperparathyroidism of renal origin: Secondary | ICD-10-CM | POA: Diagnosis not present

## 2022-04-12 DIAGNOSIS — E1029 Type 1 diabetes mellitus with other diabetic kidney complication: Secondary | ICD-10-CM | POA: Diagnosis not present

## 2022-04-12 DIAGNOSIS — D689 Coagulation defect, unspecified: Secondary | ICD-10-CM | POA: Diagnosis not present

## 2022-04-12 DIAGNOSIS — Z992 Dependence on renal dialysis: Secondary | ICD-10-CM | POA: Diagnosis not present

## 2022-04-12 DIAGNOSIS — N186 End stage renal disease: Secondary | ICD-10-CM | POA: Diagnosis not present

## 2022-04-12 DIAGNOSIS — D631 Anemia in chronic kidney disease: Secondary | ICD-10-CM | POA: Diagnosis not present

## 2022-04-12 DIAGNOSIS — D509 Iron deficiency anemia, unspecified: Secondary | ICD-10-CM | POA: Diagnosis not present

## 2022-04-15 DIAGNOSIS — Z992 Dependence on renal dialysis: Secondary | ICD-10-CM | POA: Diagnosis not present

## 2022-04-15 DIAGNOSIS — D689 Coagulation defect, unspecified: Secondary | ICD-10-CM | POA: Diagnosis not present

## 2022-04-15 DIAGNOSIS — N186 End stage renal disease: Secondary | ICD-10-CM | POA: Diagnosis not present

## 2022-04-15 DIAGNOSIS — N2581 Secondary hyperparathyroidism of renal origin: Secondary | ICD-10-CM | POA: Diagnosis not present

## 2022-04-16 DIAGNOSIS — E1022 Type 1 diabetes mellitus with diabetic chronic kidney disease: Secondary | ICD-10-CM | POA: Diagnosis not present

## 2022-04-17 DIAGNOSIS — N2581 Secondary hyperparathyroidism of renal origin: Secondary | ICD-10-CM | POA: Diagnosis not present

## 2022-04-17 DIAGNOSIS — D689 Coagulation defect, unspecified: Secondary | ICD-10-CM | POA: Diagnosis not present

## 2022-04-17 DIAGNOSIS — N186 End stage renal disease: Secondary | ICD-10-CM | POA: Diagnosis not present

## 2022-04-17 DIAGNOSIS — Z992 Dependence on renal dialysis: Secondary | ICD-10-CM | POA: Diagnosis not present

## 2022-04-18 DIAGNOSIS — E1022 Type 1 diabetes mellitus with diabetic chronic kidney disease: Secondary | ICD-10-CM | POA: Diagnosis not present

## 2022-04-19 DIAGNOSIS — D689 Coagulation defect, unspecified: Secondary | ICD-10-CM | POA: Diagnosis not present

## 2022-04-19 DIAGNOSIS — N2581 Secondary hyperparathyroidism of renal origin: Secondary | ICD-10-CM | POA: Diagnosis not present

## 2022-04-19 DIAGNOSIS — N186 End stage renal disease: Secondary | ICD-10-CM | POA: Diagnosis not present

## 2022-04-19 DIAGNOSIS — E1029 Type 1 diabetes mellitus with other diabetic kidney complication: Secondary | ICD-10-CM | POA: Diagnosis not present

## 2022-04-19 DIAGNOSIS — Z992 Dependence on renal dialysis: Secondary | ICD-10-CM | POA: Diagnosis not present

## 2022-04-22 DIAGNOSIS — D631 Anemia in chronic kidney disease: Secondary | ICD-10-CM | POA: Diagnosis not present

## 2022-04-22 DIAGNOSIS — N186 End stage renal disease: Secondary | ICD-10-CM | POA: Diagnosis not present

## 2022-04-22 DIAGNOSIS — D689 Coagulation defect, unspecified: Secondary | ICD-10-CM | POA: Diagnosis not present

## 2022-04-22 DIAGNOSIS — N2581 Secondary hyperparathyroidism of renal origin: Secondary | ICD-10-CM | POA: Diagnosis not present

## 2022-04-22 DIAGNOSIS — Z992 Dependence on renal dialysis: Secondary | ICD-10-CM | POA: Diagnosis not present

## 2022-04-24 DIAGNOSIS — N186 End stage renal disease: Secondary | ICD-10-CM | POA: Diagnosis not present

## 2022-04-24 DIAGNOSIS — D631 Anemia in chronic kidney disease: Secondary | ICD-10-CM | POA: Diagnosis not present

## 2022-04-24 DIAGNOSIS — Z992 Dependence on renal dialysis: Secondary | ICD-10-CM | POA: Diagnosis not present

## 2022-04-24 DIAGNOSIS — D689 Coagulation defect, unspecified: Secondary | ICD-10-CM | POA: Diagnosis not present

## 2022-04-24 DIAGNOSIS — N2581 Secondary hyperparathyroidism of renal origin: Secondary | ICD-10-CM | POA: Diagnosis not present

## 2022-04-26 DIAGNOSIS — N2581 Secondary hyperparathyroidism of renal origin: Secondary | ICD-10-CM | POA: Diagnosis not present

## 2022-04-26 DIAGNOSIS — D631 Anemia in chronic kidney disease: Secondary | ICD-10-CM | POA: Diagnosis not present

## 2022-04-26 DIAGNOSIS — D689 Coagulation defect, unspecified: Secondary | ICD-10-CM | POA: Diagnosis not present

## 2022-04-26 DIAGNOSIS — Z992 Dependence on renal dialysis: Secondary | ICD-10-CM | POA: Diagnosis not present

## 2022-04-26 DIAGNOSIS — N186 End stage renal disease: Secondary | ICD-10-CM | POA: Diagnosis not present

## 2022-04-29 DIAGNOSIS — D689 Coagulation defect, unspecified: Secondary | ICD-10-CM | POA: Diagnosis not present

## 2022-04-29 DIAGNOSIS — N2581 Secondary hyperparathyroidism of renal origin: Secondary | ICD-10-CM | POA: Diagnosis not present

## 2022-04-29 DIAGNOSIS — N186 End stage renal disease: Secondary | ICD-10-CM | POA: Diagnosis not present

## 2022-04-29 DIAGNOSIS — E1029 Type 1 diabetes mellitus with other diabetic kidney complication: Secondary | ICD-10-CM | POA: Diagnosis not present

## 2022-04-29 DIAGNOSIS — Z23 Encounter for immunization: Secondary | ICD-10-CM | POA: Diagnosis not present

## 2022-04-29 DIAGNOSIS — R739 Hyperglycemia, unspecified: Secondary | ICD-10-CM | POA: Diagnosis not present

## 2022-04-29 DIAGNOSIS — Z992 Dependence on renal dialysis: Secondary | ICD-10-CM | POA: Diagnosis not present

## 2022-05-01 DIAGNOSIS — R739 Hyperglycemia, unspecified: Secondary | ICD-10-CM | POA: Diagnosis not present

## 2022-05-01 DIAGNOSIS — E1029 Type 1 diabetes mellitus with other diabetic kidney complication: Secondary | ICD-10-CM | POA: Diagnosis not present

## 2022-05-01 DIAGNOSIS — N186 End stage renal disease: Secondary | ICD-10-CM | POA: Diagnosis not present

## 2022-05-01 DIAGNOSIS — D689 Coagulation defect, unspecified: Secondary | ICD-10-CM | POA: Diagnosis not present

## 2022-05-01 DIAGNOSIS — N2581 Secondary hyperparathyroidism of renal origin: Secondary | ICD-10-CM | POA: Diagnosis not present

## 2022-05-01 DIAGNOSIS — Z23 Encounter for immunization: Secondary | ICD-10-CM | POA: Diagnosis not present

## 2022-05-01 DIAGNOSIS — Z992 Dependence on renal dialysis: Secondary | ICD-10-CM | POA: Diagnosis not present

## 2022-05-03 DIAGNOSIS — N186 End stage renal disease: Secondary | ICD-10-CM | POA: Diagnosis not present

## 2022-05-03 DIAGNOSIS — R739 Hyperglycemia, unspecified: Secondary | ICD-10-CM | POA: Diagnosis not present

## 2022-05-03 DIAGNOSIS — D689 Coagulation defect, unspecified: Secondary | ICD-10-CM | POA: Diagnosis not present

## 2022-05-03 DIAGNOSIS — E1029 Type 1 diabetes mellitus with other diabetic kidney complication: Secondary | ICD-10-CM | POA: Diagnosis not present

## 2022-05-03 DIAGNOSIS — Z992 Dependence on renal dialysis: Secondary | ICD-10-CM | POA: Diagnosis not present

## 2022-05-03 DIAGNOSIS — N2581 Secondary hyperparathyroidism of renal origin: Secondary | ICD-10-CM | POA: Diagnosis not present

## 2022-05-03 DIAGNOSIS — Z23 Encounter for immunization: Secondary | ICD-10-CM | POA: Diagnosis not present

## 2022-05-06 DIAGNOSIS — N186 End stage renal disease: Secondary | ICD-10-CM | POA: Diagnosis not present

## 2022-05-06 DIAGNOSIS — D689 Coagulation defect, unspecified: Secondary | ICD-10-CM | POA: Diagnosis not present

## 2022-05-06 DIAGNOSIS — Z992 Dependence on renal dialysis: Secondary | ICD-10-CM | POA: Diagnosis not present

## 2022-05-06 DIAGNOSIS — E1029 Type 1 diabetes mellitus with other diabetic kidney complication: Secondary | ICD-10-CM | POA: Diagnosis not present

## 2022-05-06 DIAGNOSIS — N2581 Secondary hyperparathyroidism of renal origin: Secondary | ICD-10-CM | POA: Diagnosis not present

## 2022-05-06 DIAGNOSIS — D509 Iron deficiency anemia, unspecified: Secondary | ICD-10-CM | POA: Diagnosis not present

## 2022-05-08 DIAGNOSIS — Z992 Dependence on renal dialysis: Secondary | ICD-10-CM | POA: Diagnosis not present

## 2022-05-08 DIAGNOSIS — N186 End stage renal disease: Secondary | ICD-10-CM | POA: Diagnosis not present

## 2022-05-08 DIAGNOSIS — D689 Coagulation defect, unspecified: Secondary | ICD-10-CM | POA: Diagnosis not present

## 2022-05-08 DIAGNOSIS — D509 Iron deficiency anemia, unspecified: Secondary | ICD-10-CM | POA: Diagnosis not present

## 2022-05-08 DIAGNOSIS — E1029 Type 1 diabetes mellitus with other diabetic kidney complication: Secondary | ICD-10-CM | POA: Diagnosis not present

## 2022-05-08 DIAGNOSIS — N2581 Secondary hyperparathyroidism of renal origin: Secondary | ICD-10-CM | POA: Diagnosis not present

## 2022-05-10 DIAGNOSIS — N186 End stage renal disease: Secondary | ICD-10-CM | POA: Diagnosis not present

## 2022-05-10 DIAGNOSIS — D689 Coagulation defect, unspecified: Secondary | ICD-10-CM | POA: Diagnosis not present

## 2022-05-10 DIAGNOSIS — N2581 Secondary hyperparathyroidism of renal origin: Secondary | ICD-10-CM | POA: Diagnosis not present

## 2022-05-10 DIAGNOSIS — D509 Iron deficiency anemia, unspecified: Secondary | ICD-10-CM | POA: Diagnosis not present

## 2022-05-10 DIAGNOSIS — Z992 Dependence on renal dialysis: Secondary | ICD-10-CM | POA: Diagnosis not present

## 2022-05-10 DIAGNOSIS — E1029 Type 1 diabetes mellitus with other diabetic kidney complication: Secondary | ICD-10-CM | POA: Diagnosis not present

## 2022-05-13 DIAGNOSIS — N2581 Secondary hyperparathyroidism of renal origin: Secondary | ICD-10-CM | POA: Diagnosis not present

## 2022-05-13 DIAGNOSIS — N186 End stage renal disease: Secondary | ICD-10-CM | POA: Diagnosis not present

## 2022-05-13 DIAGNOSIS — D689 Coagulation defect, unspecified: Secondary | ICD-10-CM | POA: Diagnosis not present

## 2022-05-13 DIAGNOSIS — Z992 Dependence on renal dialysis: Secondary | ICD-10-CM | POA: Diagnosis not present

## 2022-05-15 DIAGNOSIS — N2581 Secondary hyperparathyroidism of renal origin: Secondary | ICD-10-CM | POA: Diagnosis not present

## 2022-05-15 DIAGNOSIS — Z992 Dependence on renal dialysis: Secondary | ICD-10-CM | POA: Diagnosis not present

## 2022-05-15 DIAGNOSIS — D689 Coagulation defect, unspecified: Secondary | ICD-10-CM | POA: Diagnosis not present

## 2022-05-15 DIAGNOSIS — N186 End stage renal disease: Secondary | ICD-10-CM | POA: Diagnosis not present

## 2022-05-17 DIAGNOSIS — N186 End stage renal disease: Secondary | ICD-10-CM | POA: Diagnosis not present

## 2022-05-17 DIAGNOSIS — N2581 Secondary hyperparathyroidism of renal origin: Secondary | ICD-10-CM | POA: Diagnosis not present

## 2022-05-17 DIAGNOSIS — D689 Coagulation defect, unspecified: Secondary | ICD-10-CM | POA: Diagnosis not present

## 2022-05-17 DIAGNOSIS — Z992 Dependence on renal dialysis: Secondary | ICD-10-CM | POA: Diagnosis not present

## 2022-05-20 DIAGNOSIS — E1022 Type 1 diabetes mellitus with diabetic chronic kidney disease: Secondary | ICD-10-CM | POA: Diagnosis not present

## 2022-05-20 DIAGNOSIS — Z992 Dependence on renal dialysis: Secondary | ICD-10-CM | POA: Diagnosis not present

## 2022-05-20 DIAGNOSIS — E1029 Type 1 diabetes mellitus with other diabetic kidney complication: Secondary | ICD-10-CM | POA: Diagnosis not present

## 2022-05-20 DIAGNOSIS — N186 End stage renal disease: Secondary | ICD-10-CM | POA: Diagnosis not present

## 2022-05-20 DIAGNOSIS — D631 Anemia in chronic kidney disease: Secondary | ICD-10-CM | POA: Diagnosis not present

## 2022-05-20 DIAGNOSIS — D689 Coagulation defect, unspecified: Secondary | ICD-10-CM | POA: Diagnosis not present

## 2022-05-20 DIAGNOSIS — N2581 Secondary hyperparathyroidism of renal origin: Secondary | ICD-10-CM | POA: Diagnosis not present

## 2022-05-22 DIAGNOSIS — N2581 Secondary hyperparathyroidism of renal origin: Secondary | ICD-10-CM | POA: Diagnosis not present

## 2022-05-22 DIAGNOSIS — N186 End stage renal disease: Secondary | ICD-10-CM | POA: Diagnosis not present

## 2022-05-22 DIAGNOSIS — Z992 Dependence on renal dialysis: Secondary | ICD-10-CM | POA: Diagnosis not present

## 2022-05-22 DIAGNOSIS — D689 Coagulation defect, unspecified: Secondary | ICD-10-CM | POA: Diagnosis not present

## 2022-05-23 DIAGNOSIS — Z9229 Personal history of other drug therapy: Secondary | ICD-10-CM | POA: Diagnosis not present

## 2022-05-23 DIAGNOSIS — I10 Essential (primary) hypertension: Secondary | ICD-10-CM | POA: Diagnosis not present

## 2022-05-23 DIAGNOSIS — E1165 Type 2 diabetes mellitus with hyperglycemia: Secondary | ICD-10-CM | POA: Diagnosis not present

## 2022-05-23 DIAGNOSIS — E782 Mixed hyperlipidemia: Secondary | ICD-10-CM | POA: Diagnosis not present

## 2022-05-23 DIAGNOSIS — Z0001 Encounter for general adult medical examination with abnormal findings: Secondary | ICD-10-CM | POA: Diagnosis not present

## 2022-05-23 DIAGNOSIS — D638 Anemia in other chronic diseases classified elsewhere: Secondary | ICD-10-CM | POA: Diagnosis not present

## 2022-05-23 DIAGNOSIS — I119 Hypertensive heart disease without heart failure: Secondary | ICD-10-CM | POA: Diagnosis not present

## 2022-05-24 DIAGNOSIS — D689 Coagulation defect, unspecified: Secondary | ICD-10-CM | POA: Diagnosis not present

## 2022-05-24 DIAGNOSIS — Z992 Dependence on renal dialysis: Secondary | ICD-10-CM | POA: Diagnosis not present

## 2022-05-24 DIAGNOSIS — N186 End stage renal disease: Secondary | ICD-10-CM | POA: Diagnosis not present

## 2022-05-24 DIAGNOSIS — N2581 Secondary hyperparathyroidism of renal origin: Secondary | ICD-10-CM | POA: Diagnosis not present

## 2022-05-27 DIAGNOSIS — N2581 Secondary hyperparathyroidism of renal origin: Secondary | ICD-10-CM | POA: Diagnosis not present

## 2022-05-27 DIAGNOSIS — D689 Coagulation defect, unspecified: Secondary | ICD-10-CM | POA: Diagnosis not present

## 2022-05-27 DIAGNOSIS — N186 End stage renal disease: Secondary | ICD-10-CM | POA: Diagnosis not present

## 2022-05-27 DIAGNOSIS — Z992 Dependence on renal dialysis: Secondary | ICD-10-CM | POA: Diagnosis not present

## 2022-05-29 DIAGNOSIS — N186 End stage renal disease: Secondary | ICD-10-CM | POA: Diagnosis not present

## 2022-05-29 DIAGNOSIS — Z992 Dependence on renal dialysis: Secondary | ICD-10-CM | POA: Diagnosis not present

## 2022-05-29 DIAGNOSIS — N2581 Secondary hyperparathyroidism of renal origin: Secondary | ICD-10-CM | POA: Diagnosis not present

## 2022-05-29 DIAGNOSIS — D689 Coagulation defect, unspecified: Secondary | ICD-10-CM | POA: Diagnosis not present

## 2022-05-31 DIAGNOSIS — Z992 Dependence on renal dialysis: Secondary | ICD-10-CM | POA: Diagnosis not present

## 2022-05-31 DIAGNOSIS — N2581 Secondary hyperparathyroidism of renal origin: Secondary | ICD-10-CM | POA: Diagnosis not present

## 2022-05-31 DIAGNOSIS — N186 End stage renal disease: Secondary | ICD-10-CM | POA: Diagnosis not present

## 2022-05-31 DIAGNOSIS — D689 Coagulation defect, unspecified: Secondary | ICD-10-CM | POA: Diagnosis not present

## 2022-06-03 DIAGNOSIS — D631 Anemia in chronic kidney disease: Secondary | ICD-10-CM | POA: Diagnosis not present

## 2022-06-03 DIAGNOSIS — N186 End stage renal disease: Secondary | ICD-10-CM | POA: Diagnosis not present

## 2022-06-03 DIAGNOSIS — D509 Iron deficiency anemia, unspecified: Secondary | ICD-10-CM | POA: Diagnosis not present

## 2022-06-03 DIAGNOSIS — E1029 Type 1 diabetes mellitus with other diabetic kidney complication: Secondary | ICD-10-CM | POA: Diagnosis not present

## 2022-06-03 DIAGNOSIS — Z992 Dependence on renal dialysis: Secondary | ICD-10-CM | POA: Diagnosis not present

## 2022-06-03 DIAGNOSIS — N2581 Secondary hyperparathyroidism of renal origin: Secondary | ICD-10-CM | POA: Diagnosis not present

## 2022-06-03 DIAGNOSIS — D689 Coagulation defect, unspecified: Secondary | ICD-10-CM | POA: Diagnosis not present

## 2022-06-05 DIAGNOSIS — E1029 Type 1 diabetes mellitus with other diabetic kidney complication: Secondary | ICD-10-CM | POA: Diagnosis not present

## 2022-06-05 DIAGNOSIS — N2581 Secondary hyperparathyroidism of renal origin: Secondary | ICD-10-CM | POA: Diagnosis not present

## 2022-06-05 DIAGNOSIS — Z992 Dependence on renal dialysis: Secondary | ICD-10-CM | POA: Diagnosis not present

## 2022-06-05 DIAGNOSIS — D631 Anemia in chronic kidney disease: Secondary | ICD-10-CM | POA: Diagnosis not present

## 2022-06-05 DIAGNOSIS — D689 Coagulation defect, unspecified: Secondary | ICD-10-CM | POA: Diagnosis not present

## 2022-06-05 DIAGNOSIS — D509 Iron deficiency anemia, unspecified: Secondary | ICD-10-CM | POA: Diagnosis not present

## 2022-06-05 DIAGNOSIS — N186 End stage renal disease: Secondary | ICD-10-CM | POA: Diagnosis not present

## 2022-06-07 DIAGNOSIS — D509 Iron deficiency anemia, unspecified: Secondary | ICD-10-CM | POA: Diagnosis not present

## 2022-06-07 DIAGNOSIS — N2581 Secondary hyperparathyroidism of renal origin: Secondary | ICD-10-CM | POA: Diagnosis not present

## 2022-06-07 DIAGNOSIS — D689 Coagulation defect, unspecified: Secondary | ICD-10-CM | POA: Diagnosis not present

## 2022-06-07 DIAGNOSIS — D631 Anemia in chronic kidney disease: Secondary | ICD-10-CM | POA: Diagnosis not present

## 2022-06-07 DIAGNOSIS — Z992 Dependence on renal dialysis: Secondary | ICD-10-CM | POA: Diagnosis not present

## 2022-06-07 DIAGNOSIS — E1029 Type 1 diabetes mellitus with other diabetic kidney complication: Secondary | ICD-10-CM | POA: Diagnosis not present

## 2022-06-07 DIAGNOSIS — N186 End stage renal disease: Secondary | ICD-10-CM | POA: Diagnosis not present

## 2022-06-09 DIAGNOSIS — Z992 Dependence on renal dialysis: Secondary | ICD-10-CM | POA: Diagnosis not present

## 2022-06-09 DIAGNOSIS — N2581 Secondary hyperparathyroidism of renal origin: Secondary | ICD-10-CM | POA: Diagnosis not present

## 2022-06-09 DIAGNOSIS — D689 Coagulation defect, unspecified: Secondary | ICD-10-CM | POA: Diagnosis not present

## 2022-06-09 DIAGNOSIS — N186 End stage renal disease: Secondary | ICD-10-CM | POA: Diagnosis not present

## 2022-06-11 DIAGNOSIS — D689 Coagulation defect, unspecified: Secondary | ICD-10-CM | POA: Diagnosis not present

## 2022-06-11 DIAGNOSIS — N186 End stage renal disease: Secondary | ICD-10-CM | POA: Diagnosis not present

## 2022-06-11 DIAGNOSIS — N2581 Secondary hyperparathyroidism of renal origin: Secondary | ICD-10-CM | POA: Diagnosis not present

## 2022-06-11 DIAGNOSIS — Z992 Dependence on renal dialysis: Secondary | ICD-10-CM | POA: Diagnosis not present

## 2022-06-14 DIAGNOSIS — D689 Coagulation defect, unspecified: Secondary | ICD-10-CM | POA: Diagnosis not present

## 2022-06-14 DIAGNOSIS — N2581 Secondary hyperparathyroidism of renal origin: Secondary | ICD-10-CM | POA: Diagnosis not present

## 2022-06-14 DIAGNOSIS — N186 End stage renal disease: Secondary | ICD-10-CM | POA: Diagnosis not present

## 2022-06-14 DIAGNOSIS — Z992 Dependence on renal dialysis: Secondary | ICD-10-CM | POA: Diagnosis not present

## 2022-06-16 DIAGNOSIS — I119 Hypertensive heart disease without heart failure: Secondary | ICD-10-CM | POA: Diagnosis not present

## 2022-06-16 DIAGNOSIS — E1165 Type 2 diabetes mellitus with hyperglycemia: Secondary | ICD-10-CM | POA: Diagnosis not present

## 2022-06-16 DIAGNOSIS — E782 Mixed hyperlipidemia: Secondary | ICD-10-CM | POA: Diagnosis not present

## 2022-06-16 DIAGNOSIS — I10 Essential (primary) hypertension: Secondary | ICD-10-CM | POA: Diagnosis not present

## 2022-06-16 DIAGNOSIS — Z0001 Encounter for general adult medical examination with abnormal findings: Secondary | ICD-10-CM | POA: Diagnosis not present

## 2022-06-16 DIAGNOSIS — D638 Anemia in other chronic diseases classified elsewhere: Secondary | ICD-10-CM | POA: Diagnosis not present

## 2022-06-17 DIAGNOSIS — D631 Anemia in chronic kidney disease: Secondary | ICD-10-CM | POA: Diagnosis not present

## 2022-06-17 DIAGNOSIS — N2581 Secondary hyperparathyroidism of renal origin: Secondary | ICD-10-CM | POA: Diagnosis not present

## 2022-06-17 DIAGNOSIS — D689 Coagulation defect, unspecified: Secondary | ICD-10-CM | POA: Diagnosis not present

## 2022-06-17 DIAGNOSIS — Z992 Dependence on renal dialysis: Secondary | ICD-10-CM | POA: Diagnosis not present

## 2022-06-17 DIAGNOSIS — N186 End stage renal disease: Secondary | ICD-10-CM | POA: Diagnosis not present

## 2022-06-19 DIAGNOSIS — N2581 Secondary hyperparathyroidism of renal origin: Secondary | ICD-10-CM | POA: Diagnosis not present

## 2022-06-19 DIAGNOSIS — Z992 Dependence on renal dialysis: Secondary | ICD-10-CM | POA: Diagnosis not present

## 2022-06-19 DIAGNOSIS — E1022 Type 1 diabetes mellitus with diabetic chronic kidney disease: Secondary | ICD-10-CM | POA: Diagnosis not present

## 2022-06-19 DIAGNOSIS — E1029 Type 1 diabetes mellitus with other diabetic kidney complication: Secondary | ICD-10-CM | POA: Diagnosis not present

## 2022-06-19 DIAGNOSIS — D689 Coagulation defect, unspecified: Secondary | ICD-10-CM | POA: Diagnosis not present

## 2022-06-19 DIAGNOSIS — D631 Anemia in chronic kidney disease: Secondary | ICD-10-CM | POA: Diagnosis not present

## 2022-06-19 DIAGNOSIS — N186 End stage renal disease: Secondary | ICD-10-CM | POA: Diagnosis not present

## 2022-06-21 DIAGNOSIS — Z992 Dependence on renal dialysis: Secondary | ICD-10-CM | POA: Diagnosis not present

## 2022-06-21 DIAGNOSIS — N2581 Secondary hyperparathyroidism of renal origin: Secondary | ICD-10-CM | POA: Diagnosis not present

## 2022-06-21 DIAGNOSIS — N186 End stage renal disease: Secondary | ICD-10-CM | POA: Diagnosis not present

## 2022-06-21 DIAGNOSIS — D689 Coagulation defect, unspecified: Secondary | ICD-10-CM | POA: Diagnosis not present

## 2022-06-24 DIAGNOSIS — Z992 Dependence on renal dialysis: Secondary | ICD-10-CM | POA: Diagnosis not present

## 2022-06-24 DIAGNOSIS — N186 End stage renal disease: Secondary | ICD-10-CM | POA: Diagnosis not present

## 2022-06-24 DIAGNOSIS — N2581 Secondary hyperparathyroidism of renal origin: Secondary | ICD-10-CM | POA: Diagnosis not present

## 2022-06-24 DIAGNOSIS — D689 Coagulation defect, unspecified: Secondary | ICD-10-CM | POA: Diagnosis not present

## 2022-06-26 DIAGNOSIS — D689 Coagulation defect, unspecified: Secondary | ICD-10-CM | POA: Diagnosis not present

## 2022-06-26 DIAGNOSIS — N2581 Secondary hyperparathyroidism of renal origin: Secondary | ICD-10-CM | POA: Diagnosis not present

## 2022-06-26 DIAGNOSIS — Z992 Dependence on renal dialysis: Secondary | ICD-10-CM | POA: Diagnosis not present

## 2022-06-26 DIAGNOSIS — N186 End stage renal disease: Secondary | ICD-10-CM | POA: Diagnosis not present

## 2022-06-28 DIAGNOSIS — Z992 Dependence on renal dialysis: Secondary | ICD-10-CM | POA: Diagnosis not present

## 2022-06-28 DIAGNOSIS — D689 Coagulation defect, unspecified: Secondary | ICD-10-CM | POA: Diagnosis not present

## 2022-06-28 DIAGNOSIS — N2581 Secondary hyperparathyroidism of renal origin: Secondary | ICD-10-CM | POA: Diagnosis not present

## 2022-06-28 DIAGNOSIS — N186 End stage renal disease: Secondary | ICD-10-CM | POA: Diagnosis not present

## 2022-07-01 DIAGNOSIS — N2581 Secondary hyperparathyroidism of renal origin: Secondary | ICD-10-CM | POA: Diagnosis not present

## 2022-07-01 DIAGNOSIS — D631 Anemia in chronic kidney disease: Secondary | ICD-10-CM | POA: Diagnosis not present

## 2022-07-01 DIAGNOSIS — N186 End stage renal disease: Secondary | ICD-10-CM | POA: Diagnosis not present

## 2022-07-01 DIAGNOSIS — D689 Coagulation defect, unspecified: Secondary | ICD-10-CM | POA: Diagnosis not present

## 2022-07-01 DIAGNOSIS — Z992 Dependence on renal dialysis: Secondary | ICD-10-CM | POA: Diagnosis not present

## 2022-07-02 DIAGNOSIS — E1022 Type 1 diabetes mellitus with diabetic chronic kidney disease: Secondary | ICD-10-CM | POA: Diagnosis not present

## 2022-07-02 DIAGNOSIS — E103593 Type 1 diabetes mellitus with proliferative diabetic retinopathy without macular edema, bilateral: Secondary | ICD-10-CM | POA: Diagnosis not present

## 2022-07-02 DIAGNOSIS — N186 End stage renal disease: Secondary | ICD-10-CM | POA: Diagnosis not present

## 2022-07-02 DIAGNOSIS — Z9641 Presence of insulin pump (external) (internal): Secondary | ICD-10-CM | POA: Diagnosis not present

## 2022-07-02 DIAGNOSIS — Z992 Dependence on renal dialysis: Secondary | ICD-10-CM | POA: Diagnosis not present

## 2022-07-03 DIAGNOSIS — Z992 Dependence on renal dialysis: Secondary | ICD-10-CM | POA: Diagnosis not present

## 2022-07-03 DIAGNOSIS — N2581 Secondary hyperparathyroidism of renal origin: Secondary | ICD-10-CM | POA: Diagnosis not present

## 2022-07-03 DIAGNOSIS — D689 Coagulation defect, unspecified: Secondary | ICD-10-CM | POA: Diagnosis not present

## 2022-07-03 DIAGNOSIS — D631 Anemia in chronic kidney disease: Secondary | ICD-10-CM | POA: Diagnosis not present

## 2022-07-03 DIAGNOSIS — N186 End stage renal disease: Secondary | ICD-10-CM | POA: Diagnosis not present

## 2022-07-05 DIAGNOSIS — Z992 Dependence on renal dialysis: Secondary | ICD-10-CM | POA: Diagnosis not present

## 2022-07-05 DIAGNOSIS — N2581 Secondary hyperparathyroidism of renal origin: Secondary | ICD-10-CM | POA: Diagnosis not present

## 2022-07-05 DIAGNOSIS — N186 End stage renal disease: Secondary | ICD-10-CM | POA: Diagnosis not present

## 2022-07-05 DIAGNOSIS — D689 Coagulation defect, unspecified: Secondary | ICD-10-CM | POA: Diagnosis not present

## 2022-07-05 DIAGNOSIS — D631 Anemia in chronic kidney disease: Secondary | ICD-10-CM | POA: Diagnosis not present

## 2022-07-08 DIAGNOSIS — E1029 Type 1 diabetes mellitus with other diabetic kidney complication: Secondary | ICD-10-CM | POA: Diagnosis not present

## 2022-07-08 DIAGNOSIS — D689 Coagulation defect, unspecified: Secondary | ICD-10-CM | POA: Diagnosis not present

## 2022-07-08 DIAGNOSIS — Z992 Dependence on renal dialysis: Secondary | ICD-10-CM | POA: Diagnosis not present

## 2022-07-08 DIAGNOSIS — N186 End stage renal disease: Secondary | ICD-10-CM | POA: Diagnosis not present

## 2022-07-08 DIAGNOSIS — N2581 Secondary hyperparathyroidism of renal origin: Secondary | ICD-10-CM | POA: Diagnosis not present

## 2022-07-08 DIAGNOSIS — D509 Iron deficiency anemia, unspecified: Secondary | ICD-10-CM | POA: Diagnosis not present

## 2022-07-10 DIAGNOSIS — N2581 Secondary hyperparathyroidism of renal origin: Secondary | ICD-10-CM | POA: Diagnosis not present

## 2022-07-10 DIAGNOSIS — Z992 Dependence on renal dialysis: Secondary | ICD-10-CM | POA: Diagnosis not present

## 2022-07-10 DIAGNOSIS — D509 Iron deficiency anemia, unspecified: Secondary | ICD-10-CM | POA: Diagnosis not present

## 2022-07-10 DIAGNOSIS — N186 End stage renal disease: Secondary | ICD-10-CM | POA: Diagnosis not present

## 2022-07-10 DIAGNOSIS — E1029 Type 1 diabetes mellitus with other diabetic kidney complication: Secondary | ICD-10-CM | POA: Diagnosis not present

## 2022-07-10 DIAGNOSIS — D689 Coagulation defect, unspecified: Secondary | ICD-10-CM | POA: Diagnosis not present

## 2022-07-12 DIAGNOSIS — D689 Coagulation defect, unspecified: Secondary | ICD-10-CM | POA: Diagnosis not present

## 2022-07-12 DIAGNOSIS — D509 Iron deficiency anemia, unspecified: Secondary | ICD-10-CM | POA: Diagnosis not present

## 2022-07-12 DIAGNOSIS — E1029 Type 1 diabetes mellitus with other diabetic kidney complication: Secondary | ICD-10-CM | POA: Diagnosis not present

## 2022-07-12 DIAGNOSIS — Z992 Dependence on renal dialysis: Secondary | ICD-10-CM | POA: Diagnosis not present

## 2022-07-12 DIAGNOSIS — N2581 Secondary hyperparathyroidism of renal origin: Secondary | ICD-10-CM | POA: Diagnosis not present

## 2022-07-12 DIAGNOSIS — N186 End stage renal disease: Secondary | ICD-10-CM | POA: Diagnosis not present

## 2022-07-15 DIAGNOSIS — D689 Coagulation defect, unspecified: Secondary | ICD-10-CM | POA: Diagnosis not present

## 2022-07-15 DIAGNOSIS — N186 End stage renal disease: Secondary | ICD-10-CM | POA: Diagnosis not present

## 2022-07-15 DIAGNOSIS — Z992 Dependence on renal dialysis: Secondary | ICD-10-CM | POA: Diagnosis not present

## 2022-07-15 DIAGNOSIS — N2581 Secondary hyperparathyroidism of renal origin: Secondary | ICD-10-CM | POA: Diagnosis not present

## 2022-07-17 DIAGNOSIS — N186 End stage renal disease: Secondary | ICD-10-CM | POA: Diagnosis not present

## 2022-07-17 DIAGNOSIS — D689 Coagulation defect, unspecified: Secondary | ICD-10-CM | POA: Diagnosis not present

## 2022-07-17 DIAGNOSIS — Z992 Dependence on renal dialysis: Secondary | ICD-10-CM | POA: Diagnosis not present

## 2022-07-17 DIAGNOSIS — N2581 Secondary hyperparathyroidism of renal origin: Secondary | ICD-10-CM | POA: Diagnosis not present

## 2022-07-17 IMAGING — US IR ABDOMEN US LIMITED
1 series · 11 of 11 positions shown · non-contrast
Comparison: None.

CLINICAL DATA: 43-year-old female with suspected ascites

EXAM:
LIMITED ABDOMEN ULTRASOUND FOR ASCITES
TECHNIQUE: Limited ultrasound survey for ascites was performed in all four
abdominal quadrants.

[Series 1: ir (id) (id)/(id)/(id) ir · 11 of 11 slices shown]
[im 1/11]
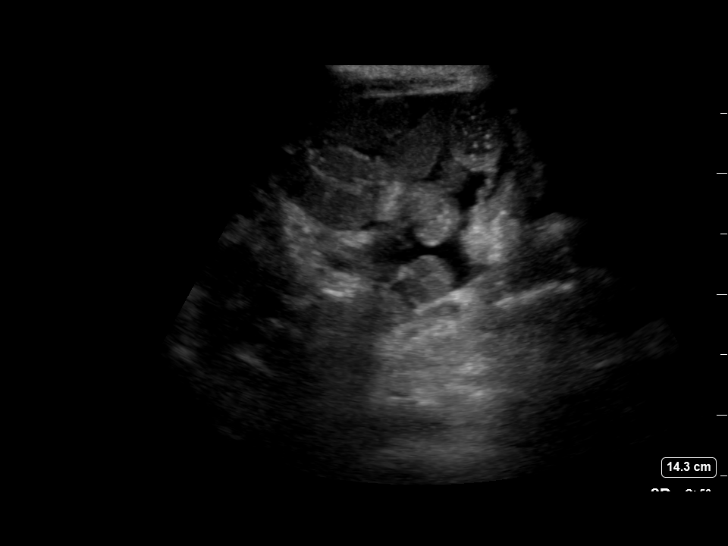
[im 2/11]
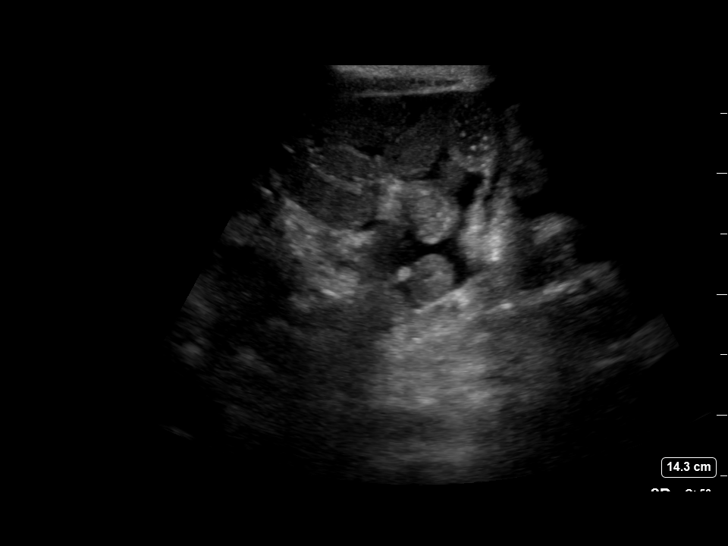
[im 3/11]
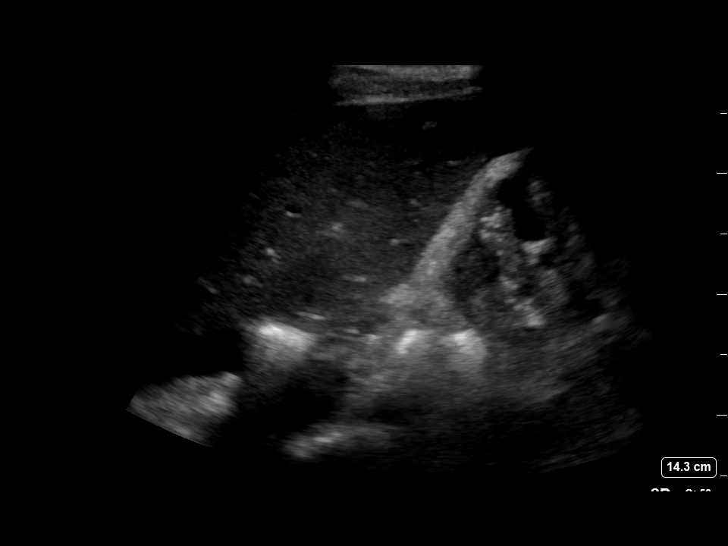
[im 4/11]
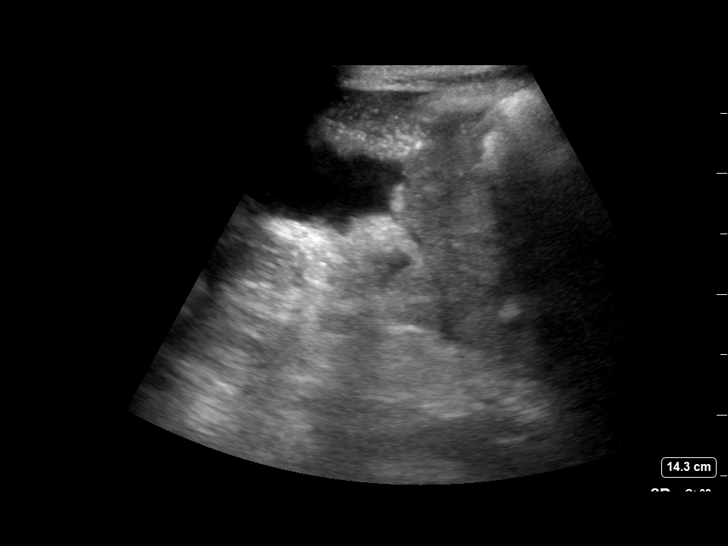
[im 5/11]
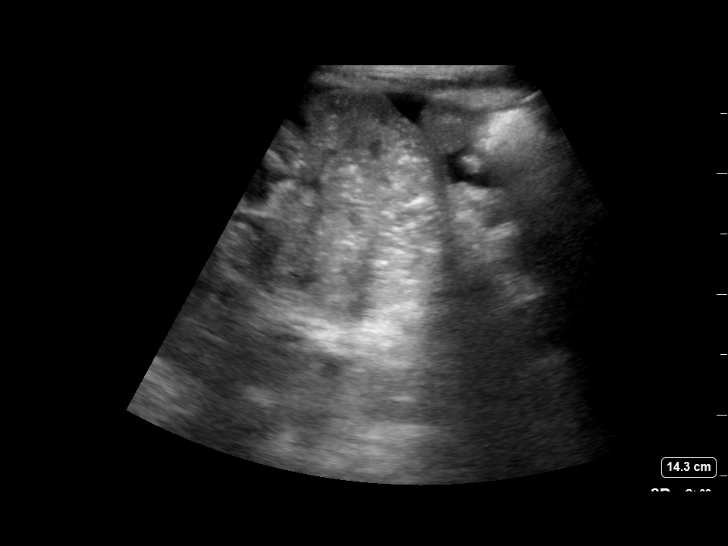
[im 6/11]
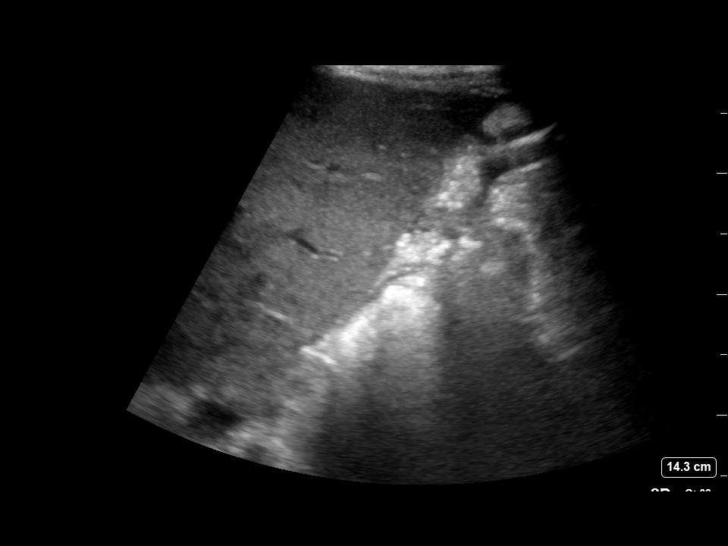
[im 7/11]
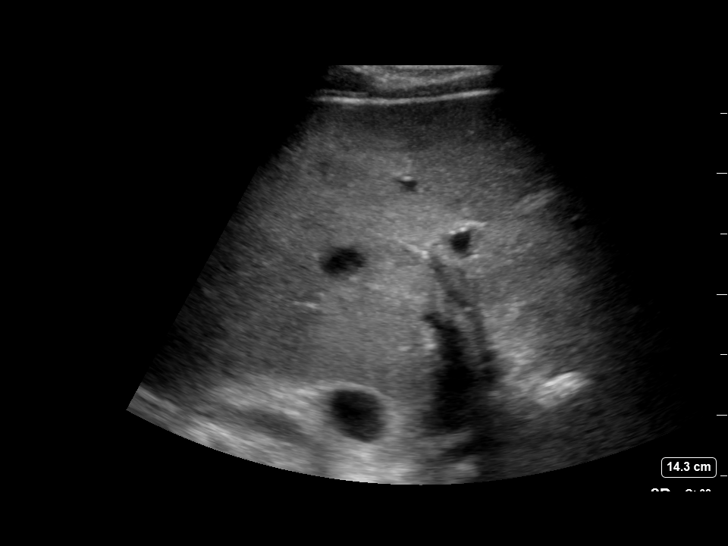
[im 8/11]
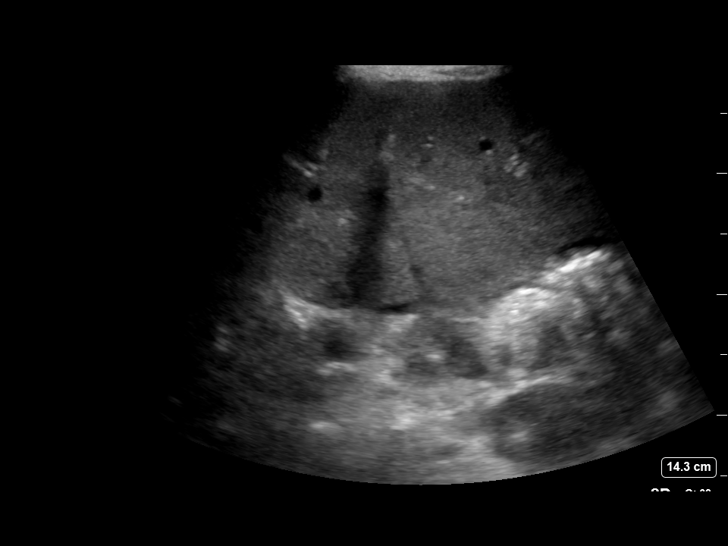
[im 9/11]
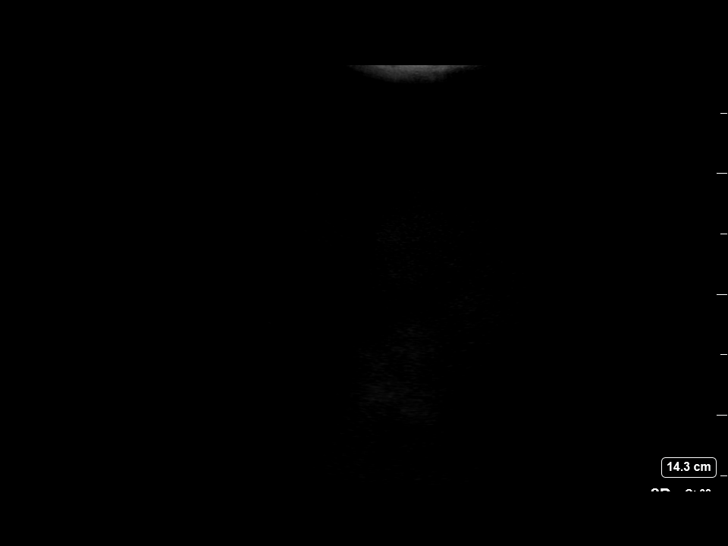
[im 10/11]
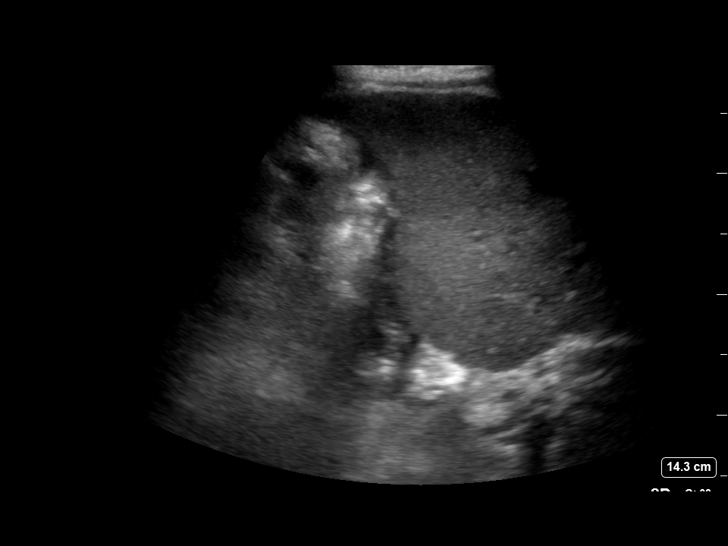
[im 11/11]
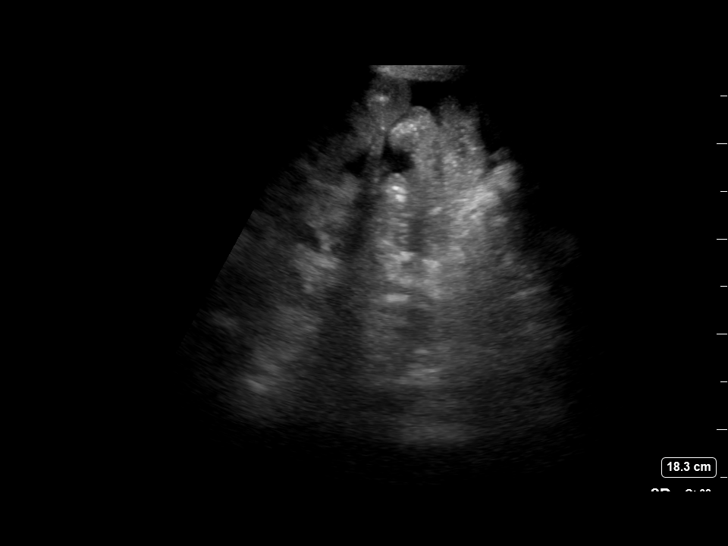

[11 of 11 positions shown; findings below may reference images not displayed]

FINDINGS: The 4 quadrants of the abdomen were interrogated with ultrasound.
There is trace ascites, but it is insufficient for paracentesis.
IMPRESSION: Trace ascites, insufficient for aspiration. Paracentesis was
deferred.

## 2022-07-19 DIAGNOSIS — N186 End stage renal disease: Secondary | ICD-10-CM | POA: Diagnosis not present

## 2022-07-19 DIAGNOSIS — N2581 Secondary hyperparathyroidism of renal origin: Secondary | ICD-10-CM | POA: Diagnosis not present

## 2022-07-19 DIAGNOSIS — D689 Coagulation defect, unspecified: Secondary | ICD-10-CM | POA: Diagnosis not present

## 2022-07-19 DIAGNOSIS — Z992 Dependence on renal dialysis: Secondary | ICD-10-CM | POA: Diagnosis not present

## 2022-07-20 DIAGNOSIS — E1029 Type 1 diabetes mellitus with other diabetic kidney complication: Secondary | ICD-10-CM | POA: Diagnosis not present

## 2022-07-20 DIAGNOSIS — N186 End stage renal disease: Secondary | ICD-10-CM | POA: Diagnosis not present

## 2022-07-20 DIAGNOSIS — Z992 Dependence on renal dialysis: Secondary | ICD-10-CM | POA: Diagnosis not present

## 2022-07-22 DIAGNOSIS — Z992 Dependence on renal dialysis: Secondary | ICD-10-CM | POA: Diagnosis not present

## 2022-07-22 DIAGNOSIS — N2581 Secondary hyperparathyroidism of renal origin: Secondary | ICD-10-CM | POA: Diagnosis not present

## 2022-07-22 DIAGNOSIS — N186 End stage renal disease: Secondary | ICD-10-CM | POA: Diagnosis not present

## 2022-07-24 DIAGNOSIS — N2581 Secondary hyperparathyroidism of renal origin: Secondary | ICD-10-CM | POA: Diagnosis not present

## 2022-07-24 DIAGNOSIS — N186 End stage renal disease: Secondary | ICD-10-CM | POA: Diagnosis not present

## 2022-07-24 DIAGNOSIS — Z992 Dependence on renal dialysis: Secondary | ICD-10-CM | POA: Diagnosis not present

## 2022-07-26 DIAGNOSIS — N186 End stage renal disease: Secondary | ICD-10-CM | POA: Diagnosis not present

## 2022-07-26 DIAGNOSIS — N2581 Secondary hyperparathyroidism of renal origin: Secondary | ICD-10-CM | POA: Diagnosis not present

## 2022-07-26 DIAGNOSIS — Z992 Dependence on renal dialysis: Secondary | ICD-10-CM | POA: Diagnosis not present

## 2022-07-29 DIAGNOSIS — N2581 Secondary hyperparathyroidism of renal origin: Secondary | ICD-10-CM | POA: Diagnosis not present

## 2022-07-29 DIAGNOSIS — Z992 Dependence on renal dialysis: Secondary | ICD-10-CM | POA: Diagnosis not present

## 2022-07-29 DIAGNOSIS — N186 End stage renal disease: Secondary | ICD-10-CM | POA: Diagnosis not present

## 2022-07-31 DIAGNOSIS — N2581 Secondary hyperparathyroidism of renal origin: Secondary | ICD-10-CM | POA: Diagnosis not present

## 2022-07-31 DIAGNOSIS — N186 End stage renal disease: Secondary | ICD-10-CM | POA: Diagnosis not present

## 2022-07-31 DIAGNOSIS — Z992 Dependence on renal dialysis: Secondary | ICD-10-CM | POA: Diagnosis not present

## 2022-08-02 DIAGNOSIS — N2581 Secondary hyperparathyroidism of renal origin: Secondary | ICD-10-CM | POA: Diagnosis not present

## 2022-08-02 DIAGNOSIS — Z992 Dependence on renal dialysis: Secondary | ICD-10-CM | POA: Diagnosis not present

## 2022-08-02 DIAGNOSIS — N186 End stage renal disease: Secondary | ICD-10-CM | POA: Diagnosis not present

## 2022-08-05 DIAGNOSIS — N186 End stage renal disease: Secondary | ICD-10-CM | POA: Diagnosis not present

## 2022-08-05 DIAGNOSIS — N2581 Secondary hyperparathyroidism of renal origin: Secondary | ICD-10-CM | POA: Diagnosis not present

## 2022-08-05 DIAGNOSIS — Z992 Dependence on renal dialysis: Secondary | ICD-10-CM | POA: Diagnosis not present

## 2022-08-06 ENCOUNTER — Encounter (HOSPITAL_COMMUNITY): Payer: Self-pay

## 2022-08-06 ENCOUNTER — Emergency Department (HOSPITAL_COMMUNITY): Payer: Medicare HMO

## 2022-08-06 ENCOUNTER — Other Ambulatory Visit: Payer: Self-pay

## 2022-08-06 ENCOUNTER — Inpatient Hospital Stay (HOSPITAL_COMMUNITY)
Admission: EM | Admit: 2022-08-06 | Discharge: 2022-08-15 | DRG: 252 | Disposition: A | Discharge status: Home Health | Payer: Medicare HMO | Attending: Internal Medicine | Admitting: Internal Medicine

## 2022-08-06 DIAGNOSIS — T82898A Other specified complication of vascular prosthetic devices, implants and grafts, initial encounter: Secondary | ICD-10-CM | POA: Diagnosis not present

## 2022-08-06 DIAGNOSIS — Z794 Long term (current) use of insulin: Secondary | ICD-10-CM | POA: Diagnosis not present

## 2022-08-06 DIAGNOSIS — M25571 Pain in right ankle and joints of right foot: Secondary | ICD-10-CM | POA: Diagnosis present

## 2022-08-06 DIAGNOSIS — E10319 Type 1 diabetes mellitus with unspecified diabetic retinopathy without macular edema: Secondary | ICD-10-CM | POA: Diagnosis present

## 2022-08-06 DIAGNOSIS — N2581 Secondary hyperparathyroidism of renal origin: Secondary | ICD-10-CM | POA: Diagnosis present

## 2022-08-06 DIAGNOSIS — E1051 Type 1 diabetes mellitus with diabetic peripheral angiopathy without gangrene: Secondary | ICD-10-CM | POA: Diagnosis present

## 2022-08-06 DIAGNOSIS — I5032 Chronic diastolic (congestive) heart failure: Secondary | ICD-10-CM | POA: Diagnosis present

## 2022-08-06 DIAGNOSIS — Y832 Surgical operation with anastomosis, bypass or graft as the cause of abnormal reaction of the patient, or of later complication, without mention of misadventure at the time of the procedure: Secondary | ICD-10-CM | POA: Diagnosis present

## 2022-08-06 DIAGNOSIS — R600 Localized edema: Secondary | ICD-10-CM | POA: Diagnosis not present

## 2022-08-06 DIAGNOSIS — I132 Hypertensive heart and chronic kidney disease with heart failure and with stage 5 chronic kidney disease, or end stage renal disease: Secondary | ICD-10-CM | POA: Diagnosis present

## 2022-08-06 DIAGNOSIS — R7881 Bacteremia: Secondary | ICD-10-CM | POA: Diagnosis present

## 2022-08-06 DIAGNOSIS — Z9641 Presence of insulin pump (external) (internal): Secondary | ICD-10-CM | POA: Diagnosis present

## 2022-08-06 DIAGNOSIS — J1520 Pneumonia due to staphylococcus, unspecified: Secondary | ICD-10-CM | POA: Diagnosis not present

## 2022-08-06 DIAGNOSIS — I120 Hypertensive chronic kidney disease with stage 5 chronic kidney disease or end stage renal disease: Secondary | ICD-10-CM | POA: Diagnosis not present

## 2022-08-06 DIAGNOSIS — J189 Pneumonia, unspecified organism: Secondary | ICD-10-CM | POA: Diagnosis present

## 2022-08-06 DIAGNOSIS — Z1152 Encounter for screening for COVID-19: Secondary | ICD-10-CM

## 2022-08-06 DIAGNOSIS — N186 End stage renal disease: Secondary | ICD-10-CM | POA: Diagnosis present

## 2022-08-06 DIAGNOSIS — Z8673 Personal history of transient ischemic attack (TIA), and cerebral infarction without residual deficits: Secondary | ICD-10-CM | POA: Diagnosis not present

## 2022-08-06 DIAGNOSIS — T827XXA Infection and inflammatory reaction due to other cardiac and vascular devices, implants and grafts, initial encounter: Secondary | ICD-10-CM | POA: Diagnosis present

## 2022-08-06 DIAGNOSIS — D6959 Other secondary thrombocytopenia: Secondary | ICD-10-CM | POA: Diagnosis present

## 2022-08-06 DIAGNOSIS — K029 Dental caries, unspecified: Secondary | ICD-10-CM | POA: Diagnosis present

## 2022-08-06 DIAGNOSIS — E119 Type 2 diabetes mellitus without complications: Secondary | ICD-10-CM

## 2022-08-06 DIAGNOSIS — J9601 Acute respiratory failure with hypoxia: Secondary | ICD-10-CM

## 2022-08-06 DIAGNOSIS — M1990 Unspecified osteoarthritis, unspecified site: Secondary | ICD-10-CM | POA: Diagnosis present

## 2022-08-06 DIAGNOSIS — N185 Chronic kidney disease, stage 5: Secondary | ICD-10-CM | POA: Diagnosis not present

## 2022-08-06 DIAGNOSIS — Z803 Family history of malignant neoplasm of breast: Secondary | ICD-10-CM

## 2022-08-06 DIAGNOSIS — E785 Hyperlipidemia, unspecified: Secondary | ICD-10-CM | POA: Diagnosis present

## 2022-08-06 DIAGNOSIS — Z992 Dependence on renal dialysis: Secondary | ICD-10-CM

## 2022-08-06 DIAGNOSIS — D631 Anemia in chronic kidney disease: Secondary | ICD-10-CM | POA: Diagnosis present

## 2022-08-06 DIAGNOSIS — Z79899 Other long term (current) drug therapy: Secondary | ICD-10-CM | POA: Diagnosis not present

## 2022-08-06 DIAGNOSIS — Z8249 Family history of ischemic heart disease and other diseases of the circulatory system: Secondary | ICD-10-CM

## 2022-08-06 DIAGNOSIS — E10649 Type 1 diabetes mellitus with hypoglycemia without coma: Secondary | ICD-10-CM | POA: Diagnosis present

## 2022-08-06 DIAGNOSIS — E1059 Type 1 diabetes mellitus with other circulatory complications: Secondary | ICD-10-CM

## 2022-08-06 DIAGNOSIS — E11649 Type 2 diabetes mellitus with hypoglycemia without coma: Secondary | ICD-10-CM | POA: Diagnosis not present

## 2022-08-06 DIAGNOSIS — E162 Hypoglycemia, unspecified: Secondary | ICD-10-CM

## 2022-08-06 DIAGNOSIS — R7401 Elevation of levels of liver transaminase levels: Secondary | ICD-10-CM | POA: Diagnosis present

## 2022-08-06 DIAGNOSIS — I10 Essential (primary) hypertension: Secondary | ICD-10-CM | POA: Diagnosis not present

## 2022-08-06 DIAGNOSIS — I509 Heart failure, unspecified: Secondary | ICD-10-CM | POA: Diagnosis not present

## 2022-08-06 DIAGNOSIS — B9561 Methicillin susceptible Staphylococcus aureus infection as the cause of diseases classified elsewhere: Secondary | ICD-10-CM | POA: Diagnosis present

## 2022-08-06 DIAGNOSIS — I088 Other rheumatic multiple valve diseases: Secondary | ICD-10-CM | POA: Diagnosis not present

## 2022-08-06 DIAGNOSIS — Z7982 Long term (current) use of aspirin: Secondary | ICD-10-CM

## 2022-08-06 DIAGNOSIS — E1022 Type 1 diabetes mellitus with diabetic chronic kidney disease: Secondary | ICD-10-CM | POA: Diagnosis present

## 2022-08-06 DIAGNOSIS — D696 Thrombocytopenia, unspecified: Secondary | ICD-10-CM

## 2022-08-06 DIAGNOSIS — R6884 Jaw pain: Secondary | ICD-10-CM | POA: Diagnosis not present

## 2022-08-06 DIAGNOSIS — I361 Nonrheumatic tricuspid (valve) insufficiency: Secondary | ICD-10-CM | POA: Diagnosis not present

## 2022-08-06 DIAGNOSIS — R0602 Shortness of breath: Secondary | ICD-10-CM | POA: Diagnosis present

## 2022-08-06 DIAGNOSIS — J90 Pleural effusion, not elsewhere classified: Secondary | ICD-10-CM | POA: Diagnosis not present

## 2022-08-06 LAB — CBG MONITORING, ED
Glucose-Capillary: 24 mg/dL — CL (ref 70–99)
Glucose-Capillary: 88 mg/dL (ref 70–99)

## 2022-08-06 LAB — COMPREHENSIVE METABOLIC PANEL
ALT: 338 U/L — ABNORMAL HIGH (ref 0–44)
AST: 720 U/L — ABNORMAL HIGH (ref 15–41)
Albumin: 3.4 g/dL — ABNORMAL LOW (ref 3.5–5.0)
Alkaline Phosphatase: 49 U/L (ref 38–126)
Anion gap: 16 — ABNORMAL HIGH (ref 5–15)
BUN: 28 mg/dL — ABNORMAL HIGH (ref 6–20)
CO2: 28 mmol/L (ref 22–32)
Calcium: 8.5 mg/dL — ABNORMAL LOW (ref 8.9–10.3)
Chloride: 92 mmol/L — ABNORMAL LOW (ref 98–111)
Creatinine, Ser: 7.36 mg/dL — ABNORMAL HIGH (ref 0.44–1.00)
GFR, Estimated: 6 mL/min — ABNORMAL LOW (ref >60)
Glucose, Bld: 101 mg/dL — ABNORMAL HIGH (ref 70–99)
Potassium: 4 mmol/L (ref 3.5–5.1)
Sodium: 136 mmol/L (ref 135–145)
Total Bilirubin: 0.9 mg/dL (ref 0.3–1.2)
Total Protein: 6.6 g/dL (ref 6.5–8.1)

## 2022-08-06 LAB — CBC WITH DIFFERENTIAL/PLATELET
Abs Immature Granulocytes: 0.06 10*3/uL (ref 0.00–0.07)
Basophils Absolute: 0 10*3/uL (ref 0.0–0.1)
Basophils Relative: 0 %
Eosinophils Absolute: 0 10*3/uL (ref 0.0–0.5)
Eosinophils Relative: 0 %
HCT: 32.5 % — ABNORMAL LOW (ref 36.0–46.0)
Hemoglobin: 10.1 g/dL — ABNORMAL LOW (ref 12.0–15.0)
Immature Granulocytes: 1 %
Lymphocytes Relative: 10 %
Lymphs Abs: 0.8 10*3/uL (ref 0.7–4.0)
MCH: 27.4 pg (ref 26.0–34.0)
MCHC: 31.1 g/dL (ref 30.0–36.0)
MCV: 88.3 fL (ref 80.0–100.0)
Monocytes Absolute: 0.5 10*3/uL (ref 0.1–1.0)
Monocytes Relative: 7 %
Neutro Abs: 6.3 10*3/uL (ref 1.7–7.7)
Neutrophils Relative %: 82 %
Platelets: 66 10*3/uL — ABNORMAL LOW (ref 150–400)
RBC: 3.68 MIL/uL — ABNORMAL LOW (ref 3.87–5.11)
RDW: 17.9 % — ABNORMAL HIGH (ref 11.5–15.5)
WBC: 7.8 10*3/uL (ref 4.0–10.5)
nRBC: 0 % (ref 0.0–0.2)

## 2022-08-06 LAB — HEPATITIS PANEL, ACUTE
HCV Ab: NONREACTIVE
Hep A IgM: NONREACTIVE
Hep B C IgM: NONREACTIVE
Hepatitis B Surface Ag: NONREACTIVE

## 2022-08-06 LAB — RESP PANEL BY RT-PCR (RSV, FLU A&B, COVID)  RVPGX2
Influenza A by PCR: NEGATIVE
Influenza B by PCR: NEGATIVE
Resp Syncytial Virus by PCR: NEGATIVE
SARS Coronavirus 2 by RT PCR: NEGATIVE

## 2022-08-06 LAB — LIPASE, BLOOD: Lipase: 24 U/L (ref 11–51)

## 2022-08-06 LAB — HEPATITIS B SURFACE ANTIGEN: Hepatitis B Surface Ag: NONREACTIVE

## 2022-08-06 LAB — HIV ANTIBODY (ROUTINE TESTING W REFLEX): HIV Screen 4th Generation wRfx: NONREACTIVE

## 2022-08-06 LAB — PHOSPHORUS: Phosphorus: 7.1 mg/dL — ABNORMAL HIGH (ref 2.5–4.6)

## 2022-08-06 LAB — BRAIN NATRIURETIC PEPTIDE: B Natriuretic Peptide: 3292.7 pg/mL — ABNORMAL HIGH (ref 0.0–100.0)

## 2022-08-06 MED ORDER — SODIUM CHLORIDE 0.9 % IV SOLN
1.0000 g | Freq: Once | INTRAVENOUS | Status: AC
  Administered 2022-08-06: 1 g via INTRAVENOUS
  Filled 2022-08-06: qty 10

## 2022-08-06 MED ORDER — LABETALOL HCL 100 MG PO TABS
300.0000 mg | ORAL_TABLET | Freq: Every day | ORAL | Status: DC
  Administered 2022-08-07 – 2022-08-15 (×7): 300 mg via ORAL
  Filled 2022-08-06 (×8): qty 3

## 2022-08-06 MED ORDER — POLYETHYLENE GLYCOL 3350 17 G PO PACK: 17.0000 g | PACK | Freq: Every day | ORAL | Status: DC | PRN

## 2022-08-06 MED ORDER — DEXTROSE 5 % IV SOLN
250.0000 mg | INTRAVENOUS | Status: DC
  Administered 2022-08-07 – 2022-08-08 (×2): 250 mg via INTRAVENOUS
  Filled 2022-08-06 (×2): qty 2.5

## 2022-08-06 MED ORDER — ASPIRIN 81 MG PO TBEC
81.0000 mg | DELAYED_RELEASE_TABLET | Freq: Every day | ORAL | Status: DC
  Administered 2022-08-07: 81 mg via ORAL
  Filled 2022-08-06 (×5): qty 1

## 2022-08-06 MED ORDER — INSULIN PUMP
Freq: Three times a day (TID) | SUBCUTANEOUS | Status: DC
  Administered 2022-08-09: 0.87 via SUBCUTANEOUS
  Administered 2022-08-11: 0.65 via SUBCUTANEOUS
  Administered 2022-08-12: 1.36 via SUBCUTANEOUS
  Administered 2022-08-12: 0.88 via SUBCUTANEOUS
  Administered 2022-08-12: 1.7 via SUBCUTANEOUS
  Administered 2022-08-12: 0.96 via SUBCUTANEOUS
  Filled 2022-08-06: qty 1

## 2022-08-06 MED ORDER — CHLORHEXIDINE GLUCONATE CLOTH 2 % EX PADS
6.0000 | MEDICATED_PAD | Freq: Every day | CUTANEOUS | Status: DC
  Administered 2022-08-07 – 2022-08-15 (×9): 6 via TOPICAL

## 2022-08-06 MED ORDER — SODIUM CHLORIDE 0.9 % IV SOLN: 2.0000 g | INTRAVENOUS | Status: DC

## 2022-08-06 MED ORDER — ACETAMINOPHEN 325 MG PO TABS
650.0000 mg | ORAL_TABLET | Freq: Four times a day (QID) | ORAL | Status: DC | PRN
  Administered 2022-08-07: 650 mg via ORAL
  Filled 2022-08-06: qty 2

## 2022-08-06 MED ORDER — SODIUM CHLORIDE 0.9% FLUSH
3.0000 mL | Freq: Two times a day (BID) | INTRAVENOUS | Status: DC
  Administered 2022-08-07 – 2022-08-15 (×14): 3 mL via INTRAVENOUS

## 2022-08-06 MED ORDER — SODIUM CHLORIDE 0.9 % IV SOLN
1.0000 g | INTRAVENOUS | Status: DC
  Administered 2022-08-07: 1 g via INTRAVENOUS
  Filled 2022-08-06: qty 10

## 2022-08-06 MED ORDER — DOXYCYCLINE HYCLATE 100 MG PO TABS
100.0000 mg | ORAL_TABLET | Freq: Once | ORAL | Status: AC
Start: 2022-08-06 — End: 2022-08-06
  Administered 2022-08-06: 100 mg via ORAL
  Filled 2022-08-06: qty 1

## 2022-08-06 MED ORDER — AMLODIPINE BESYLATE 10 MG PO TABS
10.0000 mg | ORAL_TABLET | Freq: Every day | ORAL | Status: DC
  Administered 2022-08-07 – 2022-08-15 (×8): 10 mg via ORAL
  Filled 2022-08-06 (×9): qty 1

## 2022-08-06 MED ORDER — DEXTROSE 50 % IV SOLN
INTRAVENOUS | Status: AC
  Filled 2022-08-06: qty 50

## 2022-08-06 MED ORDER — ACETAMINOPHEN 650 MG RE SUPP: 650.0000 mg | Freq: Four times a day (QID) | RECTAL | Status: DC | PRN

## 2022-08-06 NOTE — ED Notes (Signed)
Called for dinner tray. Kitchen is sending tray up.

## 2022-08-06 NOTE — Consult Note (Signed)
Reason for Consult: ESRD Referring Physician: Dr. Neva Seat  Chief Complaint:  Shortness of breath  Dialysis Orders: [facility] TTS  425 / 800, 2K 2 Ca, EDW 58.4 (left at 57.8 on 1/16), 3:30, AVF UFP4  venofer 50 weekly, Mircera 50q2 (given 1/16) Heparin 2K Units hectorol 2 TIW, sensipar 30 TIW  Assessment/Plan: 1. Hypoxia - ongoing O2 requirement.  Plan on dialyzing tomorrow AM. 2. ESRD on dialysis since 09/2010 - on HD TTS.  Next HD tomorrow. 3. Anemia of CKD- Hgb 10.1. No indication for ESA, just given on 1/16. 4. Secondary hyperparathyroidism - Will check Aa and phos; continue VDRA, binders and sensipar for now 5. HTN/volume - BP well controlled.  Continue home meds.  6. H/o right sided pleural effusion + h/o CT in the past 7. Nutrition - Renal diet w/fluid restrictions. Alb 3.4. +protein supplements. 8. DM since age 50 9. PAD    HPI: [name] is an 47 y.o. female HTN, HLD, HTN, DM, h/o right sided pleural effusions requiring thoracentesis (not currently on home O2), CVA, ESRD presenting with shortness of breath which started after dialysis yesterday. She denies any cough, sinus drainage or sore throat but had fever and chills at dialysis yesterday. She had some shortness of breath prior to dialysis as well. She denies chest pain, abdominal pain (but has diarrhea), nausea, vomiting. She was hypoxic initially at 70% in the ED but 98% on 1-2L. BUN Cr 38/7.36 with elevated transaminases. Flu, COVID, RSV were all negative but CXR revealed a RML opacity c/w pneumonia and a right sided pleural effusion as well. Patient was treated with ceftriaxone and doxycycline. Patient also had an Accu-Check showing blood glucose of 27, BNP 3292.  Sat 98% on 2L [place].  ROS Pertinent items are noted in HPI.  Chemistry and CBC: Creatinine, Ser  Date/Time Value Ref Range Status  08/06/2022 11:54 AM 7.36 (H) 0.44 - 1.00 mg/dL Final  03/08/2021 05:59 PM 7.80 (H) 0.44 - 1.00 mg/dL Final   10/31/2020 08:03 AM 6.10 (H) 0.44 - 1.00 mg/dL Final  10/31/2020 07:43 AM 6.30 (H) 0.44 - 1.00 mg/dL Final  07/27/2020 01:28 PM 5.59 (H) 0.44 - 1.00 mg/dL Final  07/04/2020 04:30 PM 5.70 (H) 0.44 - 1.00 mg/dL Final  07/04/2020 03:58 PM 3.40 (H) 0.44 - 1.00 mg/dL Final  06/03/2020 02:07 AM 3.39 (H) 0.44 - 1.00 mg/dL Final    Comment:    DELTA CHECK NOTED  06/02/2020 02:41 AM 5.85 (H) 0.44 - 1.00 mg/dL Final  06/01/2020 04:17 PM 5.18 (H) 0.44 - 1.00 mg/dL Final  06/01/2020 01:58 AM 4.27 (H) 0.44 - 1.00 mg/dL Final    Comment:    DELTA CHECK NOTED  05/31/2020 02:48 AM 6.42 (H) 0.44 - 1.00 mg/dL Final  05/30/2020 01:52 PM 5.39 (H) 0.44 - 1.00 mg/dL Final  02/14/2020 01:16 AM 6.29 (H) 0.44 - 1.00 mg/dL Final  02/13/2020 01:55 PM 6.10 (H) 0.44 - 1.00 mg/dL Final  02/11/2020 03:26 AM 4.59 (H) 0.44 - 1.00 mg/dL Final  02/10/2020 09:07 AM 3.44 (H) 0.44 - 1.00 mg/dL Final    Comment:    DELTA CHECK NOTED DIALYSIS   02/09/2020 04:05 AM 5.42 (H) 0.44 - 1.00 mg/dL Final  02/08/2020 11:55 PM 5.08 (H) 0.44 - 1.00 mg/dL Final  02/08/2020 05:15 PM 4.61 (H) 0.44 - 1.00 mg/dL Final  10/21/2019 03:54 AM 5.12 (H) 0.44 - 1.00 mg/dL Final  10/20/2019 04:55 PM 3.81 (H) 0.44 - 1.00 mg/dL Final  04/16/2018 04:34 AM 6.75 (  H) 0.44 - 1.00 mg/dL Final  04/15/2018 08:48 PM 5.57 (H) 0.44 - 1.00 mg/dL Final    Comment:    DELTA CHECK NOTED  04/15/2018 09:44 AM 11.17 (H) 0.44 - 1.00 mg/dL Final  07/15/2016 10:03 AM 10.70 (H) 0.44 - 1.00 mg/dL Final  06/08/2016 05:10 PM 7.31 (H) 0.44 - 1.00 mg/dL Final  09/14/2014 04:26 AM 8.70 (H) 0.50 - 1.10 mg/dL Final  09/12/2014 12:00 PM 11.61 (H) 0.50 - 1.10 mg/dL Final  09/12/2014 01:30 AM 10.94 (H) 0.50 - 1.10 mg/dL Final  09/11/2014 07:59 PM 10.19 (H) 0.50 - 1.10 mg/dL Final  03/31/2014 06:44 AM 7.26 (H) 0.50 - 1.10 mg/dL Final  03/30/2014 07:58 AM 10.68 (H) 0.50 - 1.10 mg/dL Final  03/29/2014 10:24 PM 9.51 (H) 0.50 - 1.10 mg/dL Final  03/29/2014 04:45 PM 9.90 (H)  0.50 - 1.10 mg/dL Final  03/29/2014 04:22 PM 8.60 (H) 0.50 - 1.10 mg/dL Final  12/29/2013 10:08 AM 9.95 (H) 0.50 - 1.10 mg/dL Final  12/25/2013 08:35 AM 5.92 (H) 0.50 - 1.10 mg/dL Final  11/30/2013 09:20 AM 7.00 (H) 0.50 - 1.10 mg/dL Final  07/17/2013 02:18 PM 7.85 (H) 0.50 - 1.10 mg/dL Final  01/29/2013 08:37 PM 4.80 (H) 0.50 - 1.10 mg/dL Final  01/29/2013 05:11 PM 3.98 (H) 0.50 - 1.10 mg/dL Final  06/12/2010 09:21 AM 5.22 (H) 0.4 - 1.2 mg/dL Final  05/14/2010 10:29 AM 4.96 (H) 0.4 - 1.2 mg/dL Final  04/12/2010 10:56 AM 4.71 (H) 0.4 - 1.2 mg/dL Final  12/03/2009 11:01 AM 5.38 (H) 0.4 - 1.2 mg/dL Final  03/05/2009 01:45 PM 4.16 (H) 0.4 - 1.2 mg/dL Final  12/11/2008 03:18 PM 3.93 (H) 0.4 - 1.2 mg/dL Final  10/09/2008 06:04 AM 3.35 (H) 0.4 - 1.2 mg/dL Final  10/08/2008 06:15 AM 3.92 (H) 0.4 - 1.2 mg/dL Final  10/07/2008 04:00 PM 3.92 (H) 0.4 - 1.2 mg/dL Final  10/07/2008 08:48 AM 3.90 (H) 0.4 - 1.2 mg/dL Final   Recent Labs  Lab 08/06/22 1154  NA 136  K 4.0  CL 92*  CO2 28  GLUCOSE 101*  BUN 28*  CREATININE 7.36*  CALCIUM 8.5*   Recent Labs  Lab 08/06/22 1154  WBC 7.8  NEUTROABS 6.3  HGB 10.1*  HCT 32.5*  MCV 88.3  PLT 66*   Liver Function Tests: Recent Labs  Lab 08/06/22 1154  AST 720*  ALT 338*  ALKPHOS 49  BILITOT 0.9  PROT 6.6  ALBUMIN 3.4*   Recent Labs  Lab 08/06/22 1154  LIPASE 24   No results for input(s): "AMMONIA" in the last 168 hours. Cardiac Enzymes: No results for input(s): "CKTOTAL", "CKMB", "CKMBINDEX", "TROPONINI" in the last 168 hours. Iron Studies: No results for input(s): "IRON", "TIBC", "TRANSFERRIN", "FERRITIN" in the last 72 hours. PT/INR: @LABRCNTIP (inr:5)  Xrays/Other Studies: ) Results for orders placed or performed during the hospital encounter of 08/06/22 (from the past 48 hour(s))  Resp panel by RT-PCR (RSV, Flu A&B, Covid) Anterior Nasal Swab     Status: None   Collection Time: 08/06/22 11:50 AM   Specimen: Anterior  Nasal Swab  Result Value Ref Range   SARS Coronavirus 2 by RT PCR NEGATIVE NEGATIVE    Comment: (NOTE) SARS-CoV-2 target nucleic acids are NOT DETECTED.  The SARS-CoV-2 RNA is generally detectable in upper respiratory specimens during the acute phase of infection. The lowest concentration of SARS-CoV-2 viral copies this assay can detect is 138 copies/mL. A negative result does not preclude SARS-Cov-2 infection and should not  be used as the sole basis for treatment or other patient management decisions. A negative result may occur with  improper specimen collection/handling, submission of specimen other than nasopharyngeal swab, presence of viral mutation(s) within the areas targeted by this assay, and inadequate number of viral copies(<138 copies/mL). A negative result must be combined with clinical observations, patient history, and epidemiological information. The expected result is Negative.  Fact Sheet for Patients:  EntrepreneurPulse.com.au  Fact Sheet for Healthcare Providers:  IncredibleEmployment.be  This test is no t yet approved or cleared by the [place] FDA and  has been authorized for detection and/or diagnosis of SARS-CoV-2 by FDA under an Emergency Use Authorization (EUA). This EUA will remain  in effect (meaning this test can be used) for the duration of the COVID-19 declaration under Section 564(b)(1) of the Act, 21 U.S.C.section 360bbb-3(b)(1), unless the authorization is terminated  or revoked sooner.       Influenza A by PCR NEGATIVE NEGATIVE   Influenza B by PCR NEGATIVE NEGATIVE    Comment: (NOTE) The Xpert Xpress SARS-CoV-2/FLU/RSV plus assay is intended as an aid in the diagnosis of influenza from Nasopharyngeal swab specimens and should not be used as a sole basis for treatment. Nasal washings and aspirates are unacceptable for Xpert Xpress SARS-CoV-2/FLU/RSV testing.  Fact Sheet for  Patients: EntrepreneurPulse.com.au  Fact Sheet for Healthcare Providers: IncredibleEmployment.be  This test is not yet approved or cleared by the [place] FDA and has been authorized for detection and/or diagnosis of SARS-CoV-2 by FDA under an Emergency Use Authorization (EUA). This EUA will remain in effect (meaning this test can be used) for the duration of the COVID-19 declaration under Section 564(b)(1) of the Act, 21 U.S.C. section 360bbb-3(b)(1), unless the authorization is terminated or revoked.     Resp Syncytial Virus by PCR NEGATIVE NEGATIVE    Comment: (NOTE) Fact Sheet for Patients: EntrepreneurPulse.com.au  Fact Sheet for Healthcare Providers: IncredibleEmployment.be  This test is not yet approved or cleared by the [place] FDA and has been authorized for detection and/or diagnosis of SARS-CoV-2 by FDA under an Emergency Use Authorization (EUA). This EUA will remain in effect (meaning this test can be used) for the duration of the COVID-19 declaration under Section 564(b)(1) of the Act, 21 U.S.C. section 360bbb-3(b)(1), unless the authorization is terminated or revoked.  Performed at [facility], [place] [address], [place], [place] [address]   CBC with Differential     Status: Abnormal   Collection Time: 08/06/22 11:54 AM  Result Value Ref Range   WBC 7.8 4.0 - 10.5 K/uL   RBC 3.68 (L) 3.87 - 5.11 MIL/uL   Hemoglobin 10.1 (L) 12.0 - 15.0 g/dL   HCT 32.5 (L) 36.0 - 46.0 %   MCV 88.3 80.0 - 100.0 fL   MCH 27.4 26.0 - 34.0 pg   MCHC 31.1 30.0 - 36.0 g/dL   RDW 17.9 (H) 11.5 - 15.5 %   Platelets 66 (L) 150 - 400 K/uL    Comment: Immature Platelet Fraction may be clinically indicated, consider ordering this additional test [id number] REPEATED TO VERIFY PLATELET COUNT CONFIRMED BY SMEAR    nRBC 0.0 0.0 - 0.2 %   Neutrophils Relative % 82 %   Neutro Abs 6.3 1.7 - 7.7 K/uL    Lymphocytes Relative 10 %   Lymphs Abs 0.8 0.7 - 4.0 K/uL   Monocytes Relative 7 %   Monocytes Absolute 0.5 0.1 - 1.0 K/uL   Eosinophils Relative 0 %  Eosinophils Absolute 0.0 0.0 - 0.5 K/uL   Basophils Relative 0 %   Basophils Absolute 0.0 0.0 - 0.1 K/uL   Immature Granulocytes 1 %   Abs Immature Granulocytes 0.06 0.00 - 0.07 K/uL   Acanthocytes PRESENT    Polychromasia PRESENT    Target Cells PRESENT     Comment: Performed at [facility] [address], [place], [place] [address]  Comprehensive metabolic panel     Status: Abnormal   Collection Time: 08/06/22 11:54 AM  Result Value Ref Range   Sodium 136 135 - 145 mmol/L   Potassium 4.0 3.5 - 5.1 mmol/L   Chloride 92 (L) 98 - 111 mmol/L   CO2 28 22 - 32 mmol/L   Glucose, Bld 101 (H) 70 - 99 mg/dL    Comment: Glucose reference range applies only to samples taken after fasting for at least 8 hours.   BUN 28 (H) 6 - 20 mg/dL   Creatinine, Ser 7.36 (H) 0.44 - 1.00 mg/dL   Calcium 8.5 (L) 8.9 - 10.3 mg/dL   Total Protein 6.6 6.5 - 8.1 g/dL   Albumin 3.4 (L) 3.5 - 5.0 g/dL   AST 720 (H) 15 - 41 U/L   ALT 338 (H) 0 - 44 U/L   Alkaline Phosphatase 49 38 - 126 U/L   Total Bilirubin 0.9 0.3 - 1.2 mg/dL   GFR, Estimated 6 (L) >60 mL/min    Comment: (NOTE) Calculated using the CKD-EPI Creatinine Equation (2021)    Anion gap 16 (H) 5 - 15    Comment: Performed at [facility], [place] [address], [place], [place] [address]  Lipase, blood     Status: None   Collection Time: 08/06/22 11:54 AM  Result Value Ref Range   Lipase 24 11 - 51 U/L    Comment: Performed at [facility] [address], [place], [place] [address]  Brain natriuretic peptide     Status: Abnormal   Collection Time: 08/06/22 11:54 AM  Result Value Ref Range   B Natriuretic Peptide 3,292.7 (H) 0.0 - 100.0 pg/mL    Comment: Performed at [facility] [address] [place], [place] [address]  CBG monitoring, ED     Status: Abnormal    Collection Time: 08/06/22  2:35 PM  Result Value Ref Range   Glucose-Capillary 24 (LL) 70 - 99 mg/dL    Comment: Glucose reference range applies only to samples taken after fasting for at least 8 hours.   Comment 1 Notify RN   POC CBG, ED     Status: None   Collection Time: 08/06/22  3:47 PM  Result Value Ref Range   Glucose-Capillary 88 70 - 99 mg/dL    Comment: Glucose reference range applies only to samples taken after fasting for at least 8 hours.   DG Chest 2 View  Result Date: 08/06/2022 CLINICAL DATA:  Shortness of breath EXAM: CHEST - 2 VIEW COMPARISON:  Chest x-ray March 08, 2021 FINDINGS: Unchanged cardiomegaly. Stable mediastinal contours. Confluent opacity in the right middle lobe. Moderate right pleural effusion. No focal opacity in the left lung. No large pneumothorax. The visualized upper abdomen is unremarkable. No acute osseous abnormality. IMPRESSION: 1. Right middle lobe confluent opacity, concerning for pneumonia. Recommend treatment of acute illness and repeat chest radiograph in 6-8 weeks to ensure resolution and exclude underlying malignancy. 2. Moderate right pleural effusion. Electronically Signed   By: Beryle Flock M.D.   On:  08/06/2022 13:22    PMH:   Past Medical History:  Diagnosis Date   Anemia    Arthritis    Ascites 03/28/2020   Bacteremia 02/11/2020   Cerebral infarction due to unspecified mechanism 05/22/2014   CHF (congestive heart failure) (HCC)    Diabetes mellitus    Diabetic retinopathy ([place])    Hx of laser Rx's   DM type 1 (diabetes mellitus, type 1) ([place] [place]) 09/12/2014   Age of onset for DM type 1 was age 46.      Enlarged thyroid gland    ESRD on hemodialysis ([place])    ESRD due to DM type I, age of onset DM 1 was age 43.  Went on dialysis in March 2012.  Gets HD now at [facility] on a TTS schedule.     ESRD on hemodialysis ([place]) 09/12/2014   ESRD due to DM type 1.  Started HD in 2012 at [facility].  Gets HD there on a TTS schedule.      GERD (gastroesophageal reflux disease)    HCAP (healthcare-associated pneumonia) 09/11/2014   Hemorrhage from arteriovenous dialysis graft ([place]) 10/20/2019   History of blood transfusion    Hyperlipidemia    Hypertension    Hypertensive emergency 05/22/2014   Peripheral vascular disease ([place])    Pneumonia     PSH:   Past Surgical History:  Procedure Laterality Date   A/V FISTULAGRAM Right 10/31/2020   Procedure: A/V FISTULAGRAM;  Surgeon: Cherre Robins, MD;  Location: [facility];  Service: Cardiovascular;  Laterality: Right;   ARTERIOVENOUS GRAFT PLACEMENT     ARTERY REPAIR Left 12/10/2012   Procedure: BRACHIAL ARTERY REPAIR;  Surgeon: Angelia Mould, MD;  Location: [facility];  Service: Vascular;  Laterality: Left;  Exploration Left Brachial Artery for AVF   AV FISTULA PLACEMENT Right 01/11/2018   Procedure: INSERTION OF ARTERIOVENOUS (AV) GORE-TEX GRAFT  UPPER ARM;  Surgeon: Angelia Mould, MD;  Location: [facility];  Service: Vascular;  Laterality: Right;   [place] Right 09/28/2017   Procedure: FIRST STAGE BASILIC VEIN TRANSPOSITION;  Surgeon: Angelia Mould, MD;  Location: [facility];  Service: Vascular;  Laterality: Right;   CESAREAN SECTION     CHEST TUBE INSERTION Right 06/01/2020   Procedure: CHEST TUBE INSERTION;  Surgeon: Garner Nash, DO;  Location: [facility];  Service: Pulmonary;  Laterality: Right;   CHEST TUBE INSERTION Right 07/04/2020   Procedure: INSERTION PLEURAL DRAINAGE CATHETER;  Surgeon: Melrose Nakayama, MD;  Location: [facility];  Service: Thoracic;  Laterality: Right;   EYE SURGERY     [place]   EYE SURGERY Left    IR THORACENTESIS ASP PLEURAL SPACE W/IMG GUIDE  05/31/2020   IR THORACENTESIS ASP PLEURAL SPACE W/IMG GUIDE  06/22/2020   REVISION OF ARTERIOVENOUS GORETEX GRAFT Left 08/01/2016   Procedure: REVISION OF ARTERIOVENOUS GORETEX GRAFT;  Surgeon: Angelia Mould, MD;  Location: [facility];  Service: Vascular;   Laterality: Left;   REVISION OF ARTERIOVENOUS GORETEX GRAFT Right 10/21/2019   Procedure: REVISION OF ARTERIOVENOUS GORETEX GRAFT ARM;  Surgeon: Rosetta Posner, MD;  Location: [facility];  Service: Vascular;  Laterality: Right;   RIGHT HEART CATH N/A 08/01/2020   Procedure: RIGHT HEART CATH;  Surgeon: Jolaine Artist, MD;  Location: [facility];  Service: Cardiovascular;  Laterality: N/A;   SHUNTOGRAM Left 11/30/2013   Procedure: Earney Mallet;  Surgeon: Serafina Mitchell, MD;  Location: [facility] C[facility] LAB;  Service: Cardiovascular;  Laterality: Left;   TEE WITHOUT CARDIOVERSION N/A 02/13/2020   Procedure: TRANSESOPHAGEAL ECHOCARDIOGRAM (TEE);  Surgeon: Josue Hector, MD;  Location: [facility] ENDOSCOPY;  Service: Cardiovascular;  Laterality: N/A;    Allergies:  Allergies  Allergen Reactions   Pollen Extract Other (See Comments)    Watery eyes    Medications:   Prior to Admission medications   Medication Sig Start Date End Date Taking? Authorizing Provider  amLODipine (NORVASC) 10 MG tablet Take 10 mg by mouth daily.    [provider]  calcium acetate (PHOSLO) 667 MG capsule Take 667 mg by mouth See admin instructions. Take 1 capsule (667 mg) by mouth with each meal & each snack 07/31/20   [provider]  chlorhexidine (PERIDEX) 0.12 % solution 15 mLs by Mouth Rinse route daily. 01/18/21   [provider]  GLUCAGON EMERGENCY 1 MG injection Inject 1 mg into the muscle once as needed (for severe low blood sugar).  11/22/12   [provider]  Insulin Human (INSULIN PUMP) SOLN Inject into the skin continuous. Novolog    [provider]  labetalol (NORMODYNE) 300 MG tablet Take 300 mg by mouth daily.    [provider]  loratadine (CLARITIN) 10 MG tablet Take 10 mg by mouth daily as needed for allergies.    [provider]  methocarbamol (ROBAXIN) 500 MG tablet Take 1 tablet (500 mg total) by mouth 2 (two) times daily. 12/21/21   Sherrill Raring, PA-C   NOVOLOG 100 UNIT/ML injection Inject into the skin See admin instructions. Via Insulin pump 10/10/20   [provider]  Pediatric Multivit-Minerals-C (FLINTSTONES GUMMIES PO) Take 2 tablets by mouth daily.    [provider]  rosuvastatin (CRESTOR) 10 MG tablet Take 10 mg by mouth at bedtime.    [provider]    Discontinued Meds:   Medications Discontinued During This Encounter  Medication Reason   cefTRIAXone (ROCEPHIN) 2 g in sodium chloride 0.9 % 100 mL IVPB     Social History:  reports that she has never smoked. She has never used smokeless tobacco. She reports that she does not drink alcohol and does not use drugs.  Family History:   Family History  Problem Relation Age of Onset   Cancer Mother        breast   Hypertension Father     Blood pressure 127/68, pulse 76, temperature 99 F (37.2 C), resp. rate 16, height 5\' 3"  (1.6 m), weight 57.2 kg, SpO2 99 %. General:WDWN, thin female in NAD on 2L [place] Heart:RRR Lungs:breath sounds markedly decreased on right side and dullness to percussion Abdomen:soft, NT, +distended Extremities:no LE edema Dialysis Access: RUA loop AVG +b/t  Neuro: A&O x3, appropriate, pleasant Back: no flank tenderness to palpation      Yuri Flener, Hunt Oris, MD 08/06/2022, 5:38 PM

## 2022-08-06 NOTE — ED Notes (Signed)
Patient's daughter stated that pt felt her sugar was low and needed to be checked. This Probation officer checked sugar and noted results. RN notified and pt brought back to triage

## 2022-08-06 NOTE — ED Notes (Signed)
Went to 3L [place] and patient sats 98% PA turn oxygen off and patient droppepd to 83% so placed  back on 3L [place] and charge notified of patient needing a room.

## 2022-08-06 NOTE — ED Provider Triage Note (Signed)
Emergency Medicine Provider Triage Evaluation Note  [name] , a 47 y.o. female  was evaluated in triage.  Patient presenting with shortness of breath.  Started yesterday at dialysis.  They also wanted her to get COVID and flu tested.  No viral symptoms.  Review of Systems  Positive:  Negative: Chest pain  Physical Exam  BP 119/62   Pulse 90   Temp 98.4 F (36.9 C) (Oral)   Resp 17   Ht 5\' 3"  (1.6 m)   Wt 57.2 kg   SpO2 98% Comment: 6L [place]  BMI 22.32 kg/m  Gen:   Awake, no distress   Resp:  Normal effort  MSK:   Moves extremities without difficulty  Other:  No wheezing or rales.  Normal breath sounds.  RRR, systolic murmur  Medical Decision Making  Medically screening exam initiated at 11:50 AM.  Appropriate orders placed.  [name] was informed that the remainder of the evaluation will be completed by another provider, this initial triage assessment does not replace that evaluation, and the importance of remaining in the ED until their evaluation is complete.     Rhae Hammock, PA-C 08/06/22 1151

## 2022-08-06 NOTE — ED Notes (Signed)
Patient taken to dialysis. 

## 2022-08-06 NOTE — ED Notes (Signed)
Patient alerts and oriented x4 at this time, patient requesting food tray, secretary will order a tray for the patient.

## 2022-08-06 NOTE — H&P (Signed)
History and Physical   [name] IFO:[id number] DOB: 1975/09/08 DOA: 08/06/2022  PCP: Benito Mccreedy, MD   Patient coming from: Home  Chief Complaint: Shortness of breath  HPI: [name] is a 47 y.o. female with medical history significant of pleural effusion, hypertension, hyperlipidemia, anxiety, diabetes, ESRD, stroke presenting with shortness of breath.  Patient has had ongoing shortness of breath for the past day or so.  She went to dialysis yesterday and was able to complete her session at that time.  She has had some diarrhea over the weekend as well.  She states that her dialysis center recommended her he had an evaluation for flu COVID RSV.  She denies eating anything unusual. Did have pizza before diarrhea started.  She denies fevers, chills, chest pain, abdominal pain, constipation, nausea, vomiting.  ED Course: Vital signs in the ED significant for initial hypoxia to 70% on room air.  This is improved into the 90s on 1 to 2 L.  Lab workup included CMP with chloride 92, BUN stable 38, creatinine stable at 7.36, gap 16, glucose 101, calcium 8.5, AST 720, ALT 338.  CBC without leukocytosis but did show hemoglobin stable at 10.1 and platelets of 66.  BNP elevated to greater than 3000.  Lipase level normal.  Respiratory panel for flu COVID RSV negative.  Chest x-ray with right middle lobe opacity consistent with pneumonia as well as an associated moderate right pleural effusion.  Patient received ceftriaxone and doxycycline in the ED.  Review of Systems: As per HPI otherwise all other systems reviewed and are negative.  Past Medical History:  Diagnosis Date   Anemia    Arthritis    Ascites 03/28/2020   Bacteremia 02/11/2020   Cerebral infarction due to unspecified mechanism 05/22/2014   CHF (congestive heart failure) (HCC)    Diabetes mellitus    Diabetic retinopathy ([place])    Hx of laser Rx's   DM type 1 (diabetes mellitus, type 1) ([place]) 09/12/2014   Age  of onset for DM type 1 was age 61.      Enlarged thyroid gland    ESRD on hemodialysis ([place])    ESRD due to DM type I, age of onset DM 1 was age 34.  Went on dialysis in March 2012.  Gets HD now at [facility] on a TTS schedule.     ESRD on hemodialysis ([employer]) 09/12/2014   ESRD due to DM type 1.  Started HD in 2012 at [facility].  Gets HD there on a TTS schedule.     GERD (gastroesophageal reflux disease)    HCAP (healthcare-associated pneumonia) 09/11/2014   Hemorrhage from arteriovenous dialysis graft ([place]) 10/20/2019   History of blood transfusion    Hyperlipidemia    Hypertension    Hypertensive emergency 05/22/2014   Peripheral vascular disease ([place])    Pneumonia     Past Surgical History:  Procedure Laterality Date   A/V FISTULAGRAM Right 10/31/2020   Procedure: A/V FISTULAGRAM;  Surgeon: Cherre Robins, MD;  Location: [place] [facility];  Service: Cardiovascular;  Laterality: Right;   ARTERIOVENOUS GRAFT PLACEMENT     ARTERY REPAIR Left 12/10/2012   Procedure: BRACHIAL ARTERY REPAIR;  Surgeon: Angelia Mould, MD;  Location: [place];  Service: Vascular;  Laterality: Left;  Exploration Left Brachial Artery for AVF   AV FISTULA PLACEMENT Right 01/11/2018   Procedure: INSERTION OF ARTERIOVENOUS (AV) GORE-TEX GRAFT  UPPER ARM;  Surgeon: Angelia Mould, MD;  [facility];  Service: Vascular;  Laterality: Right;   BASCILIC VEIN TRANSPOSITION Right 09/28/2017   Procedure: FIRST STAGE BASILIC VEIN TRANSPOSITION;  Surgeon: Angelia Mould, MD;  Location: [place];  Service: Vascular;  Laterality: Right;   CESAREAN SECTION     CHEST TUBE INSERTION Right 06/01/2020   Procedure: CHEST TUBE INSERTION;  Surgeon: Garner Nash, DO;  Location: [place];  Service: Pulmonary;  Laterality: Right;   CHEST TUBE INSERTION Right 07/04/2020   Procedure: INSERTION PLEURAL DRAINAGE CATHETER;  Surgeon: Melrose Nakayama, MD;  Location: [place];  Service: Thoracic;  Laterality:  Right;   EYE SURGERY     [place]   EYE SURGERY Left    IR THORACENTESIS ASP PLEURAL SPACE W/IMG GUIDE  05/31/2020   IR THORACENTESIS ASP PLEURAL SPACE W/IMG GUIDE  06/22/2020   REVISION OF ARTERIOVENOUS GORETEX GRAFT Left 08/01/2016   Procedure: REVISION OF ARTERIOVENOUS GORETEX GRAFT;  Surgeon: Angelia Mould, MD;  Location: [place];  Service: Vascular;  Laterality: Left;   REVISION OF ARTERIOVENOUS GORETEX GRAFT Right 10/21/2019   Procedure: REVISION OF ARTERIOVENOUS GORETEX GRAFT ARM;  Surgeon: Rosetta Posner, MD;  Location: [place];  Service: Vascular;  Laterality: Right;   RIGHT HEART CATH N/A 08/01/2020   Procedure: RIGHT HEART CATH;  Surgeon: Jolaine Artist, MD;  Location: [place] [facility];  Service: Cardiovascular;  Laterality: N/A;   SHUNTOGRAM Left 11/30/2013   Procedure: [place];  Surgeon: Serafina Mitchell, MD;  Location: [facility] CATH LAB;  Service: Cardiovascular;  Laterality: Left;   TEE WITHOUT CARDIOVERSION N/A 02/13/2020   Procedure: TRANSESOPHAGEAL ECHOCARDIOGRAM (TEE);  Surgeon: Josue Hector, MD;  Location: [facility] ENDOSCOPY;  Service: Cardiovascular;  Laterality: N/A;    Social History  reports that she has never smoked. She has never used smokeless tobacco. She reports that she does not drink alcohol and does not use drugs.  Allergies  Allergen Reactions   Pollen Extract Other (See Comments)    Watery eyes    Family History  Problem Relation Age of Onset   Cancer Mother        breast   Hypertension Father   Reviewed on admission  Prior to Admission medications   Medication Sig Start Date End Date Taking? Authorizing Provider  amLODipine (NORVASC) 10 MG tablet Take 10 mg by mouth daily.    [provider]  calcium acetate (PHOSLO) 667 MG capsule Take 667 mg by mouth See admin instructions. Take 1 capsule (667 mg) by mouth with each meal & each snack 07/31/20   [provider]  chlorhexidine (PERIDEX) 0.12 % solution 15 mLs by Mouth Rinse route  daily. 01/18/21   [provider]  GLUCAGON EMERGENCY 1 MG injection Inject 1 mg into the muscle once as needed (for severe low blood sugar).  11/22/12   [provider]  Insulin Human (INSULIN PUMP) SOLN Inject into the skin continuous. Novolog    [provider]  labetalol (NORMODYNE) 300 MG tablet Take 300 mg by mouth daily.    [provider]  loratadine (CLARITIN) 10 MG tablet Take 10 mg by mouth daily as needed for allergies.    [provider]  methocarbamol (ROBAXIN) 500 MG tablet Take 1 tablet (500 mg total) by mouth 2 (two) times daily. 12/21/21   Sherrill Raring, PA-C  NOVOLOG 100 UNIT/ML injection Inject into the skin See admin instructions. Via Insulin pump 10/10/20   [provider]  Pediatric Multivit-Minerals-C (FLINTSTONES GUMMIES PO) Take 2  tablets by mouth daily.    [provider]  rosuvastatin (CRESTOR) 10 MG tablet Take 10 mg by mouth at bedtime.    [provider]    Physical Exam: Vitals:   08/06/22 1132 08/06/22 1136 08/06/22 1258 08/06/22 1545  BP: 119/62  133/74 127/68  Pulse: 90  86 76  Resp: 17  15 16   Temp: 98.4 F (36.9 C)  99 F (37.2 C)   TempSrc: Oral     SpO2: (!) 78% 98% 99% 99%  Weight:  57.2 kg    Height:  5\' 3"  (1.6 m)      Physical Exam Constitutional:      General: She is not in acute distress.    Appearance: Normal appearance.  HENT:     Head: Normocephalic and atraumatic.     Mouth/Throat:     Mouth: Mucous membranes are moist.     Pharynx: Oropharynx is clear.  Eyes:     Extraocular Movements: Extraocular movements intact.     Pupils: Pupils are equal, round, and reactive to light.  Cardiovascular:     Rate and Rhythm: Normal rate and regular rhythm.     Pulses: Normal pulses.     Heart sounds: Murmur heard.  Pulmonary:     Effort: Pulmonary effort is normal. No respiratory distress.     Breath sounds: Examination of the right-middle field reveals rales. Rales present.   Abdominal:     General: Bowel sounds are normal. There is no distension.     Palpations: Abdomen is soft.     Tenderness: There is no abdominal tenderness.  Musculoskeletal:        General: No swelling or deformity.  Skin:    General: Skin is warm and dry.  Neurological:     General: No focal deficit present.     Mental Status: Mental status is at baseline.    Labs on Admission: I have personally reviewed following labs and imaging studies  CBC: Recent Labs  Lab 08/06/22 1154  WBC 7.8  NEUTROABS 6.3  HGB 10.1*  HCT 32.5*  MCV 88.3  PLT 66*    Basic Metabolic Panel: Recent Labs  Lab 08/06/22 1154  NA 136  K 4.0  CL 92*  CO2 28  GLUCOSE 101*  BUN 28*  CREATININE 7.36*  CALCIUM 8.5*    GFR: Estimated Creatinine Clearance: 8.1 mL/min (A) (by C-G formula based on SCr of 7.36 mg/dL (H)).  Liver Function Tests: Recent Labs  Lab 08/06/22 1154  AST 720*  ALT 338*  ALKPHOS 49  BILITOT 0.9  PROT 6.6  ALBUMIN 3.4*    Urine analysis:    Component Value Date/Time   COLORURINE YELLOW 03/29/2014 [place] 03/29/2014 1734   LABSPEC 1.015 03/29/2014 1734   PHURINE 8.0 03/29/2014 1734   GLUCOSEU >1000 (A) 03/29/2014 1734   HGBUR SMALL (A) 03/29/2014 1734   BILIRUBINUR NEGATIVE 03/29/2014 1734   KETONESUR NEGATIVE 03/29/2014 1734   PROTEINUR 100 (A) 03/29/2014 1734   UROBILINOGEN 0.2 03/29/2014 1734   NITRITE NEGATIVE 03/29/2014 1734   LEUKOCYTESUR TRACE (A) 03/29/2014 1734    Radiological Exams on Admission: DG Chest 2 View  Result Date: 08/06/2022 CLINICAL DATA:  Shortness of breath EXAM: CHEST - 2 VIEW COMPARISON:  Chest x-ray March 08, 2021 FINDINGS: Unchanged cardiomegaly. Stable mediastinal contours. Confluent opacity in the right middle lobe. Moderate right pleural effusion. No focal opacity in the left lung. No large pneumothorax. The visualized upper abdomen is  unremarkable. No acute osseous abnormality. IMPRESSION: 1. Right middle  lobe confluent opacity, concerning for pneumonia. Recommend treatment of acute illness and repeat chest radiograph in 6-8 weeks to ensure resolution and exclude underlying malignancy. 2. Moderate right pleural effusion. Electronically Signed   By: Beryle Flock M.D.   On: 08/06/2022 13:22    EKG: Independently reviewed.  Sinus rhythm at 87 beats minute.  Nonspecific T wave changes.  QTc 466.  Assessment/Plan Active Problems:   Essential hypertension, benign   DM (diabetes mellitus) ([place])   HLD (hyperlipidemia)   ESRD on hemodialysis ([place])   History of CVA (cerebrovascular accident)   CAP (community acquired pneumonia)   Pneumonia Acute respiratory failure with hypoxia > Patient presenting with shortness of breath.  Found to have chest x-ray consistent with right middle lobe pneumonia and associated right water pleural effusion.  Saturating 78% on room air on arrival, improved to the 90s on 1 to 2 L. > Unclear if there could be some component of volume overload as her BNP is significantly elevated however she states she did finish her dialysis session yesterday. > Was negative for flu COVID RSV in the ED.  No leukocytosis currently, will check full respiratory viral panel to evaluate for possible viral etiology.. > Started on ceftriaxone and doxycycline in ED - Continue with ceftriaxone and azithromycin - Trend fever curve and WBC - Check RVP  Transaminitis > Patient noted to have significant transaminitis with AST of 720 and ALT of 338.  Also has had some recent diarrhea.  States she ate pizza prior to diarrhea symptoms, which have improved. - Follow-up hepatitis panel ordered in the ED - Trend CMP - Supportive care  Thrombocytopenia > Platelets currently 66.  Unclear etiology possibly related to transaminitis as above. - Continue to trend CBC  ESRD on HD > Last session was yesterday, will be due tomorrow. - Nephrology consulted - Continue home PhosLo  Diabetes > Did have  episode of hypoglycemia in the ED as she has continued on her insulin pump and has not been eating.  She received D50 and her insulin pump is been held temporarily. - Plan for patient to resume insulin pump once she begins eating  History of CVA - Continue home ASA  Hypertension - Continue home amlodipine and labetalol 2  DVT prophylaxis: SCDs Code Status:   Full Family Communication:  Present at bedside (relation to patient not clarified)  Disposition Plan:   Patient is from:  Home  Anticipated DC to:  Home  Anticipated DC date:  1-3 days  Anticipated DC barriers: None  Consults called:  Nephrology  Admission status:  Observation, telemetry   Severity of Illness: The appropriate patient status for this patient is OBSERVATION. Observation status is judged to be reasonable and necessary in order to provide the required intensity of service to ensure the patient's safety. The patient's presenting symptoms, physical exam findings, and initial radiographic and laboratory data in the context of their medical condition is felt to place them at decreased risk for further clinical deterioration. Furthermore, it is anticipated that the patient will be medically stable for discharge from the hospital within 2 midnights of admission.    Marcelyn Bruins MD [facility]  How to contact the [facility] Attending or Consulting provider [place] or covering provider during after hours [place], for this patient?   Check the care team in [facility] and look for a) attending/consulting [facility] provider listed and b) the [facility] team listed Log  into [email or web address] and use [place]'s universal password to access. If you do not have the password, please contact the hospital operator. Locate the [facility] provider you are looking for under [facility] and page to a number that you can be directly reached. If you still have difficulty reaching the provider, please page the [facility] (Director on Call) for the Hospitalists  listed on amion for assistance.  08/06/2022, 5:31 PM

## 2022-08-06 NOTE — ED Triage Notes (Signed)
Dialysis patient who is short of breath.  Went to dialysis yesterday and was having diarrhea and didn't get all fluid off.  Initial sats 78% RA placed on 6L [place] now 98%

## 2022-08-06 NOTE — ED Provider Notes (Signed)
[place] EMERGENCY DEPARTMENT Provider Note   CSN: [id number] Arrival date & time: 08/06/22  1119     History  Chief Complaint  Patient presents with   Shortness of Breath    [name] is a 47 y.o. female with PMX of DM type I, ESRD on dialysis [facility], and hypertension. She present to the ED for SOB that started at dialysis yesterday. She came in for a Covid Test on the advice on dialysis. She is not on home oxygen.   Shortness of Breath Associated symptoms: cough        Home Medications Prior to Admission medications   Medication Sig Start Date End Date Taking? Authorizing Provider  amLODipine (NORVASC) 10 MG tablet Take 10 mg by mouth daily.    [provider]  calcium acetate (PHOSLO) 667 MG capsule Take 667 mg by mouth See admin instructions. Take 1 capsule (667 mg) by mouth with each meal & each snack 07/31/20   [provider]  chlorhexidine (PERIDEX) 0.12 % solution 15 mLs by Mouth Rinse route daily. 01/18/21   [provider]  GLUCAGON EMERGENCY 1 MG injection Inject 1 mg into the muscle once as needed (for severe low blood sugar).  11/22/12   [provider]  Insulin Human (INSULIN PUMP) SOLN Inject into the skin continuous. Novolog    [provider]  labetalol (NORMODYNE) 300 MG tablet Take 300 mg by mouth daily.    [provider]  loratadine (CLARITIN) 10 MG tablet Take 10 mg by mouth daily as needed for allergies.    [provider]  methocarbamol (ROBAXIN) 500 MG tablet Take 1 tablet (500 mg total) by mouth 2 (two) times daily. 12/21/21   Sherrill Raring, PA-C  NOVOLOG 100 UNIT/ML injection Inject into the skin See admin instructions. Via Insulin pump 10/10/20   [provider]  Pediatric Multivit-Minerals-C (FLINTSTONES GUMMIES PO) Take 2 tablets by mouth daily.    [provider]  rosuvastatin (CRESTOR) 10 MG tablet Take 10 mg by mouth at bedtime.    [provider]      Allergies    Pollen extract    Review of Systems   Review of Systems  Respiratory:  Positive for cough and shortness of breath.     Physical Exam Updated Vital Signs BP 127/68   Pulse 76   Temp 99 F (37.2 C)   Resp 16   Ht 5\' 3"  (1.6 m)   Wt 57.2 kg   SpO2 99%   BMI 22.32 kg/m  Physical Exam  ED Results / Procedures / Treatments   Labs (all labs ordered are listed, but only abnormal results are displayed) Labs Reviewed  CBC WITH DIFFERENTIAL/PLATELET - Abnormal; Notable for the following components:      Result Value   RBC 3.68 (*)    Hemoglobin 10.1 (*)    HCT 32.5 (*)    RDW 17.9 (*)    Platelets 66 (*)    All other components within normal limits  COMPREHENSIVE METABOLIC PANEL - Abnormal; Notable for the following components:   Chloride 92 (*)    Glucose, Bld 101 (*)    BUN 28 (*)    Creatinine, Ser 7.36 (*)    Calcium 8.5 (*)    Albumin 3.4 (*)    AST 720 (*)    ALT 338 (*)    GFR, Estimated 6 (*)    Anion gap 16 (*)  All other components within normal limits  BRAIN NATRIURETIC PEPTIDE - Abnormal; Notable for the following components:   B Natriuretic Peptide 3,292.7 (*)    All other components within normal limits  CBG MONITORING, ED - Abnormal; Notable for the following components:   Glucose-Capillary 24 (*)    All other components within normal limits  RESP PANEL BY RT-PCR (RSV, FLU A&B, COVID)  RVPGX2  LIPASE, BLOOD  HEPATITIS PANEL, ACUTE  HIV ANTIBODY (ROUTINE TESTING W REFLEX)  CBC  COMPREHENSIVE METABOLIC PANEL  CBG MONITORING, ED    EKG EKG Interpretation  Date/Time:  Wednesday August 06 2022 11:42:50 EST Ventricular Rate:  87 PR Interval:  168 QRS Duration: 80 QT Interval:  388 QTC Calculation: 466 R Axis:   83 Text Interpretation: Normal sinus rhythm improved T wave abnormality, consider lateral ischemia Prolonged QT When compared with ECG of 08-Mar-2021 18:05, PREVIOUS ECG IS PRESENT Confirmed by  Blanchie Dessert ([id number]) on 08/06/2022 4:19:17 PM  Radiology DG Chest 2 View  Result Date: 08/06/2022 CLINICAL DATA:  Shortness of breath EXAM: CHEST - 2 VIEW COMPARISON:  Chest x-ray March 08, 2021 FINDINGS: Unchanged cardiomegaly. Stable mediastinal contours. Confluent opacity in the right middle lobe. Moderate right pleural effusion. No focal opacity in the left lung. No large pneumothorax. The visualized upper abdomen is unremarkable. No acute osseous abnormality. IMPRESSION: 1. Right middle lobe confluent opacity, concerning for pneumonia. Recommend treatment of acute illness and repeat chest radiograph in 6-8 weeks to ensure resolution and exclude underlying malignancy. 2. Moderate right pleural effusion. Electronically Signed   By: Beryle Flock M.D.   On: 08/06/2022 13:22    Procedures Procedures    Medications Ordered in ED Medications  cefTRIAXone (ROCEPHIN) 1 g in sodium chloride 0.9 % 100 mL IVPB (has no administration in time range)  doxycycline (VIBRA-TABS) tablet 100 mg (has no administration in time range)  azithromycin (ZITHROMAX) 250 mg in dextrose 5 % 125 mL IVPB (has no administration in time range)  sodium chloride flush (NS) 0.9 % injection 3 mL (has no administration in time range)  acetaminophen (TYLENOL) tablet 650 mg (has no administration in time range)    Or  acetaminophen (TYLENOL) suppository 650 mg (has no administration in time range)  polyethylene glycol (MIRALAX / GLYCOLAX) packet 17 g (has no administration in time range)  cefTRIAXone (ROCEPHIN) 1 g in sodium chloride 0.9 % 100 mL IVPB (has no administration in time range)  dextrose 50 % solution (  Given 08/06/22 1454)    ED Course/ Medical Decision Making/ A&P                             Medical Decision Making This patient presents to the ED for concern of cough and shortness of breath, this involves an extensive number of treatment options, and is a complaint that carries with it a high risk of  complications and morbidity.  The differential diagnosis includes pulmonary edema, pneumonia, influenza, pulmonary embolism, anemia, other   Co morbidities that complicate the patient evaluation  Type 1 diabetes, ESRD, hypertension   Additional history obtained:  Additional history obtained from EMR External records from outside source obtained and reviewed including outpatient visits   Lab Tests:  I Ordered, and personally interpreted labs.  The pertinent results include: There is no leukocytosis noted, platelets are low at 66.  Hemoglobin is at her baseline.  LFTs are elevated from baseline.   Imaging Studies ordered:  I ordered imaging studies including Chest Xray  I independently visualized and interpreted imaging which showed right middle lobe pneumonia, right pleural effusion I agree with the radiologist interpretation   Cardiac Monitoring: / EKG:  The patient was maintained on a cardiac monitor.  I personally viewed and interpreted the cardiac monitored which showed an underlying rhythm of: sinus rhythm   Consultations Obtained:  I requested consultation with the specialist Dr. Trilby Drummer,  and discussed lab and imaging findings as well as pertinent plan - they recommend: Admission   Problem List / ED Course / Critical interventions / Medication management  Came in for shortness of breath today was noted to have oxygen saturation of 78% on room air.  She is not on home oxygen.  She has been coughing since yesterday and developed more shortness of breath since today.  She did do her full dialysis yesterday.  She is also noted to have blood sugar of 27 on Accu-Chek.  She has not been eating today but did have her insulin pump on which she removed and was given D50 and blood sugar improved.  She sitting up in bed with the complaints on 1/2 L of oxygen satting 99% on room air.  Chest x-ray shows a right middle lobe pneumonia.  Her labs are also significant for thrombocytopenia  and elevated LFTs.  Patient was given antibiotics for her pneumonia, hospitalist was to page for admission. I ordered medication including rocephin and doxycyline  for pneumonia  Reevaluation of the patient after these medicines showed that the patient improved I have reviewed the patients home medicines and have made adjustments as needed       Risk Prescription drug management.          Final Clinical Impression(s) / ED Diagnoses Final diagnoses:  Community acquired pneumonia of right middle lobe of lung  Thrombocytopenia ([place])  Hypoglycemia    Rx / DC Orders ED Discharge Orders     None         Darci Current 08/06/22 1730    Blanchie Dessert, MD 08/06/22 2356

## 2022-08-07 ENCOUNTER — Observation Stay (HOSPITAL_COMMUNITY): Payer: Medicare HMO

## 2022-08-07 DIAGNOSIS — R7881 Bacteremia: Secondary | ICD-10-CM

## 2022-08-07 DIAGNOSIS — N186 End stage renal disease: Secondary | ICD-10-CM | POA: Diagnosis not present

## 2022-08-07 DIAGNOSIS — J9601 Acute respiratory failure with hypoxia: Secondary | ICD-10-CM | POA: Diagnosis not present

## 2022-08-07 DIAGNOSIS — J189 Pneumonia, unspecified organism: Secondary | ICD-10-CM | POA: Diagnosis not present

## 2022-08-07 DIAGNOSIS — Z992 Dependence on renal dialysis: Secondary | ICD-10-CM | POA: Diagnosis not present

## 2022-08-07 DIAGNOSIS — R600 Localized edema: Secondary | ICD-10-CM | POA: Diagnosis not present

## 2022-08-07 LAB — CBC
HCT: 28.3 % — ABNORMAL LOW (ref 36.0–46.0)
Hemoglobin: 8.8 g/dL — ABNORMAL LOW (ref 12.0–15.0)
MCH: 27.3 pg (ref 26.0–34.0)
MCHC: 31.1 g/dL (ref 30.0–36.0)
MCV: 87.9 fL (ref 80.0–100.0)
Platelets: 87 10*3/uL — ABNORMAL LOW (ref 150–400)
RBC: 3.22 MIL/uL — ABNORMAL LOW (ref 3.87–5.11)
RDW: 17.7 % — ABNORMAL HIGH (ref 11.5–15.5)
WBC: 8.7 10*3/uL (ref 4.0–10.5)
nRBC: 0 % (ref 0.0–0.2)

## 2022-08-07 LAB — COMPREHENSIVE METABOLIC PANEL
ALT: 266 U/L — ABNORMAL HIGH (ref 0–44)
AST: 353 U/L — ABNORMAL HIGH (ref 15–41)
Albumin: 3.2 g/dL — ABNORMAL LOW (ref 3.5–5.0)
Alkaline Phosphatase: 48 U/L (ref 38–126)
Anion gap: 20 — ABNORMAL HIGH (ref 5–15)
BUN: 17 mg/dL (ref 6–20)
CO2: 22 mmol/L (ref 22–32)
Calcium: 9 mg/dL (ref 8.9–10.3)
Chloride: 92 mmol/L — ABNORMAL LOW (ref 98–111)
Creatinine, Ser: 4.67 mg/dL — ABNORMAL HIGH (ref 0.44–1.00)
GFR, Estimated: 11 mL/min — ABNORMAL LOW (ref >60)
Glucose, Bld: 126 mg/dL — ABNORMAL HIGH (ref 70–99)
Potassium: 3.7 mmol/L (ref 3.5–5.1)
Sodium: 134 mmol/L — ABNORMAL LOW (ref 135–145)
Total Bilirubin: 1.4 mg/dL — ABNORMAL HIGH (ref 0.3–1.2)
Total Protein: 6.6 g/dL (ref 6.5–8.1)

## 2022-08-07 LAB — GLUCOSE, CAPILLARY
Glucose-Capillary: 109 mg/dL — ABNORMAL HIGH (ref 70–99)
Glucose-Capillary: 169 mg/dL — ABNORMAL HIGH (ref 70–99)
Glucose-Capillary: 187 mg/dL — ABNORMAL HIGH (ref 70–99)
Glucose-Capillary: 206 mg/dL — ABNORMAL HIGH (ref 70–99)
Glucose-Capillary: 226 mg/dL — ABNORMAL HIGH (ref 70–99)

## 2022-08-07 LAB — RESPIRATORY PANEL BY PCR

## 2022-08-07 LAB — MRSA NEXT GEN BY PCR, NASAL: MRSA by PCR Next Gen: NOT DETECTED

## 2022-08-07 MED ORDER — SODIUM CHLORIDE 0.9 % IV SOLN
2.0000 g | INTRAVENOUS | Status: DC
  Administered 2022-08-08: 2 g via INTRAVENOUS
  Filled 2022-08-07: qty 20

## 2022-08-07 MED ORDER — ACETAMINOPHEN 650 MG RE SUPP: 650.0000 mg | RECTAL | Status: DC | PRN

## 2022-08-07 MED ORDER — DOXYCYCLINE HYCLATE 100 MG PO CAPS: 100.0000 mg | ORAL_CAPSULE | Freq: Two times a day (BID) | ORAL | 0 refills | Status: DC

## 2022-08-07 MED ORDER — PENTAFLUOROPROP-TETRAFLUOROETH EX AERO: 1.0000 | INHALATION_SPRAY | CUTANEOUS | Status: DC | PRN

## 2022-08-07 MED ORDER — VANCOMYCIN HCL IN DEXTROSE 1-5 GM/200ML-% IV SOLN
1000.0000 mg | Freq: Once | INTRAVENOUS | Status: AC
  Administered 2022-08-07: 1000 mg via INTRAVENOUS
  Filled 2022-08-07: qty 200

## 2022-08-07 MED ORDER — ALTEPLASE 2 MG IJ SOLR: 2.0000 mg | Freq: Once | INTRAMUSCULAR | Status: DC | PRN

## 2022-08-07 MED ORDER — AMOXICILLIN 875 MG PO TABS: 875.0000 mg | ORAL_TABLET | Freq: Two times a day (BID) | ORAL | 0 refills | Status: DC

## 2022-08-07 MED ORDER — ANTICOAGULANT SODIUM CITRATE 4% (200MG/5ML) IV SOLN: 5.0000 mL | Status: DC | PRN

## 2022-08-07 MED ORDER — LIDOCAINE-PRILOCAINE 2.5-2.5 % EX CREA: 1.0000 | TOPICAL_CREAM | CUTANEOUS | Status: DC | PRN

## 2022-08-07 MED ORDER — CHLORHEXIDINE GLUCONATE CLOTH 2 % EX PADS
6.0000 | MEDICATED_PAD | Freq: Every day | CUTANEOUS | Status: DC
  Administered 2022-08-07: 6 via TOPICAL

## 2022-08-07 MED ORDER — LIDOCAINE HCL (PF) 1 % IJ SOLN: 5.0000 mL | INTRAMUSCULAR | Status: DC | PRN

## 2022-08-07 MED ORDER — HEPARIN SODIUM (PORCINE) 1000 UNIT/ML DIALYSIS: 1000.0000 [IU] | INTRAMUSCULAR | Status: DC | PRN

## 2022-08-07 MED ORDER — IOHEXOL 350 MG/ML SOLN
75.0000 mL | Freq: Once | INTRAVENOUS | Status: AC | PRN
  Administered 2022-08-07: 75 mL via INTRAVENOUS

## 2022-08-07 MED ORDER — VANCOMYCIN HCL 500 MG/100ML IV SOLN
500.0000 mg | INTRAVENOUS | Status: DC
  Administered 2022-08-09: 500 mg via INTRAVENOUS
  Filled 2022-08-07 (×2): qty 100

## 2022-08-07 MED ORDER — AMOXICILLIN 500 MG PO TABS: 500.0000 mg | ORAL_TABLET | Freq: Two times a day (BID) | ORAL | 0 refills | Status: DC

## 2022-08-07 MED ORDER — HEPARIN SODIUM (PORCINE) 1000 UNIT/ML DIALYSIS: 2000.0000 [IU] | Freq: Once | INTRAMUSCULAR | Status: DC

## 2022-08-07 MED ORDER — ACETAMINOPHEN 500 MG PO TABS
1000.0000 mg | ORAL_TABLET | Freq: Four times a day (QID) | ORAL | Status: DC
  Administered 2022-08-07 – 2022-08-15 (×8): 1000 mg via ORAL
  Filled 2022-08-07 (×13): qty 2

## 2022-08-07 MED ORDER — HEPARIN SODIUM (PORCINE) 1000 UNIT/ML DIALYSIS
2000.0000 [IU] | Freq: Once | INTRAMUSCULAR | Status: DC
  Filled 2022-08-07: qty 2

## 2022-08-07 MED ORDER — CEFAZOLIN SODIUM-DEXTROSE 2-4 GM/100ML-% IV SOLN
2.0000 g | Freq: Once | INTRAVENOUS | Status: AC
  Administered 2022-08-07: 2 g via INTRAVENOUS
  Filled 2022-08-07: qty 100

## 2022-08-07 MED ORDER — ACETAMINOPHEN 325 MG PO TABS: 650.0000 mg | ORAL_TABLET | ORAL | Status: DC | PRN

## 2022-08-07 NOTE — Progress Notes (Signed)
  [facility] Progress Note   Subjective: Seen in room. Had dialysis early this am - 2L removed. Feels much better. On room air, eating breakfast.   Objective Vitals:   08/07/22 0454 08/07/22 0643 08/07/22 0959 08/07/22 1045  BP: 136/69 (!) 160/71 131/66 132/68  Pulse: 96 86 80 87  Resp: 17 20 18 17   Temp: (!) 103 F (39.4 C) 98.2 F (36.8 C) 98.5 F (36.9 C) 97.7 F (36.5 C)  TempSrc: Oral Oral Oral Oral  SpO2: 98% 100% 98% 90%  Weight:      Height:         Additional Objective Labs: Basic Metabolic Panel: Recent Labs  Lab 08/06/22 1154 08/06/22 2025 08/07/22 0458  NA 136  --  134*  K 4.0  --  3.7  CL 92*  --  92*  CO2 28  --  22  GLUCOSE 101*  --  126*  BUN 28*  --  17  CREATININE 7.36*  --  4.67*  CALCIUM 8.5*  --  9.0  PHOS  --  7.1*  --    CBC: Recent Labs  Lab 08/06/22 1154 08/07/22 0458  WBC 7.8 8.7  NEUTROABS 6.3  --   HGB 10.1* 8.8*  HCT 32.5* 28.3*  MCV 88.3 87.9  PLT 66* 87*   Blood Culture    Component Value Date/Time   SDES PLEURAL FLUID 05/31/2020 1205   SDES PLEURAL FLUID 05/31/2020 1205   SPECREQUEST RIGHT THORACENTESIS 05/31/2020 1205   SPECREQUEST RIGHT THORACENTESIS 05/31/2020 1205   CULT  05/31/2020 1205    NO GROWTH 5 DAYS Performed at [facility], [place] [address], [place], [place] [address]    REPTSTATUS 05/31/2020 FINAL 05/31/2020 1205   REPTSTATUS 06/05/2020 FINAL 05/31/2020 1205     Physical Exam General: Well appearing, nad Heart: RRR Lungs: Clear bilaterally  Abdomen: soft non-tender Extremities: no LE edema  Dialysis Access: R AVF +bruit   Medications:  anticoagulant sodium citrate     azithromycin 250 mg (08/07/22 1012)   cefTRIAXone (ROCEPHIN)  IV 1 g (08/07/22 1018)    amLODipine  10 mg Oral Daily   aspirin EC  81 mg Oral Daily   Chlorhexidine Gluconate Cloth  6 each Topical Q0600   Chlorhexidine Gluconate Cloth  6 each Topical Q0600   heparin  2,000 Units Dialysis Once in dialysis    insulin pump   Subcutaneous TID WC, HS, 0200   labetalol  300 mg Oral Daily   sodium chloride flush  3 mL Intravenous Q12H     Dialysis Orders: SW/[facility] TTS  425 / 800, 2K 2 Ca, EDW 58.4 (left at 57.8 on 1/16), 3:30, AVF UFP4  venofer 50 weekly, Mircera 50q2 (given 1/16) Heparin 2K Units hectorol 2 TIW, sensipar 30 TIW   Assessment/Plan: 1. Hypoxia -   Improved after dialysis. On Room air  2. ESRD on dialysis since 09/2010 - on HD TTS.  Continue on schedule  3. Anemia of CKD-  No indication for ESA, just given on 1/16. 4. Secondary hyperparathyroidism -  continue VDRA, binders and sensipar for now 5. HTN/volume - BP well controlled.  Continue home meds.  6. H/o right sided pleural effusion + h/o CT in the past 7. Nutrition - Renal diet w/fluid restrictions. Alb 3.4. +protein supplements. 8. DM since age 48 9. PAD  Lynnda Child PA-C [facility] 08/07/2022,11:17 AM

## 2022-08-07 NOTE — Progress Notes (Signed)
   08/07/22 0200  Vitals  Temp 98.7 F (37.1 C)  Temp Source Oral  BP (!) 154/67  MAP (mmHg) 92  BP Location Left Arm  BP Method Automatic  Patient Position (if appropriate) Lying  Pulse Rate 94  Pulse Rate Source Monitor  ECG Heart Rate 94  Resp (!) 26  Post Treatment  Dialyzer Clearance Lightly streaked  Duration of HD Treatment -hour(s) 3.5 hour(s)  Liters Processed 80  Fluid Removed (mL) 2000 mL  Tolerated HD Treatment Yes  AVG/AVF Arterial Site Held (minutes) 5 minutes  AVG/AVF Venous Site Held (minutes) 5 minutes   TX fin w/o difficulty.

## 2022-08-07 NOTE — Evaluation (Signed)
Physical Therapy Evaluation Patient Details Name: [name] MRN: [id number] DOB: 01-01-1976 Today's Date: 08/07/2022  History of Present Illness  [name] is a 47 y.o. female with medical history significant of pleural effusion, hypertension, hyperlipidemia, anxiety, diabetes, ESRD, stroke presenting with shortness of breath.  Found to be negative for Flu, Covid and RSV.  Clinical Impression  Patient presents with decreased mobility due to pain R LE since BP cuff tight on her leg.  She normally mobilizes independently shares IADL tasks with her H.S. daughter and drives herself usually to dialysis.  She presents with decreased strength, decreased activity tolerance, decreased ROM R LE.  She will benefit from skilled PT in the acute setting and from follow up [place].  States daughter can assist until after Tuesday due to break for exams.  PT to follow up in am prior to d/c.        Recommendations for follow up therapy are one component of a multi-disciplinary discharge planning process, led by the attending physician.  Recommendations may be updated based on patient status, additional functional criteria and insurance authorization.  Follow Up Recommendations Home health PT      Assistance Recommended at Discharge Intermittent Supervision/Assistance  Patient can return home with the following  A little help with walking and/or transfers;A little help with bathing/dressing/bathroom;Assist for transportation;Help with stairs or ramp for entrance    Equipment Recommendations Rolling walker (2 wheels)  Recommendations for Other Services       Functional Status Assessment Patient has had a recent decline in their functional status and demonstrates the ability to make significant improvements in function in a reasonable and predictable amount of time.     Precautions / Restrictions Precautions Precautions: Fall      Mobility  Bed Mobility Overal bed mobility: Needs  Assistance Bed Mobility: Supine to Sit, Sit to Supine     Supine to sit: Supervision Sit to supine: Min guard   General bed mobility comments: increased time, assist for R LE into bed    Transfers Overall transfer level: Needs assistance Equipment used: Rolling walker (2 wheels) Transfers: Sit to/from Stand Sit to Stand: Min guard           General transfer comment: cues for hand placement with RW    Ambulation/Gait Ambulation/Gait assistance: Min guard Gait Distance (Feet): 18 Feet Assistive device: Rolling walker (2 wheels) Gait Pattern/deviations: Step-to pattern, Decreased weight shift to right, Antalgic       General Gait Details: very slow, cues for ankle DF/PF and hip flex to reduce pain when attempting to ambulate; mild SOB but improved when mask removed on RA throughout  Stairs            Wheelchair Mobility    Modified Rankin (Stroke Patients Only)       Balance Overall balance assessment: Needs assistance   Sitting balance-Leahy Scale: Good     Standing balance support: During functional activity Standing balance-Leahy Scale: Fair Standing balance comment: can stand without walker, but needs it for ambulation                             Pertinent Vitals/Pain Pain Assessment Pain Assessment: Faces Faces Pain Scale: Hurts whole lot Pain Location: R ankle to hip with ambulation Pain Descriptors / Indicators: Shooting, Tender, Aching Pain Intervention(s): Monitored during session, Repositioned    Home Living Family/patient expects to be discharged to:: Private residence Living Arrangements: Children Available  Help at Discharge: Family;Available PRN/intermittently Type of Home: Other(Comment) (townhouse) Home Access: Level entry     Alternate Level Stairs-Number of Steps: flight Home Layout: Two level Home Equipment: None      Prior Function Prior Level of Function : Independent/Modified Independent                      Hand Dominance   Dominant Hand: Right    Extremity/Trunk Assessment   Upper Extremity Assessment Upper Extremity Assessment: Overall WFL for tasks assessed    Lower Extremity Assessment Lower Extremity Assessment: RLE deficits/detail RLE Deficits / Details: difficulty with hip flexion with pain lifting > 45 degrees, strength 3/5, knee extension 4/5, ankle DF mildly limited with pain       Communication   Communication: No difficulties  Cognition Arousal/Alertness: Awake/alert Behavior During Therapy: WFL for tasks assessed/performed Overall Cognitive Status: Within Functional Limits for tasks assessed                                          General Comments General comments (skin integrity, edema, etc.): issued HEP for ankle and hip stretching; educated about ice/heat for R LE; educated on ordering tub bench if needed once home if leg continues to be painful    Exercises General Exercises - Lower Extremity Ankle Circles/Pumps: AROM, 10 reps, Right, Standing Hip Flexion/Marching: AROM, 5 reps, Right, Standing Other Exercises Other Exercises: seated piriformis stretch on R x 30 sec hold x 2 Other Exercises: supine hamstring stretch with strap on R x 30 sec Other Exercises: supine ankle PF stretch with strap on R x 20 sec   Assessment/Plan    PT Assessment Patient needs continued PT services  PT Problem List Decreased strength;Decreased balance;Pain;Decreased activity tolerance;Decreased knowledge of use of DME;Decreased mobility       PT Treatment Interventions DME instruction;Functional mobility training;Balance training;Patient/family education;Therapeutic activities;Gait training;Stair training;Therapeutic exercise    PT Goals (Current goals can be found in the Care Plan section)  Acute Rehab PT Goals Patient Stated Goal: to return to independent PT Goal Formulation: With patient Time For Goal Achievement: 08/14/22 Potential to Achieve  Goals: Good    Frequency Min 3X/week     Co-evaluation               AM-PAC PT "6 Clicks" Mobility  Outcome Measure Help needed turning from your back to your side while in a flat bed without using bedrails?: None Help needed moving from lying on your back to sitting on the side of a flat bed without using bedrails?: None Help needed moving to and from a bed to a chair (including a wheelchair)?: A Little Help needed standing up from a chair using your arms (e.g., wheelchair or bedside chair)?: A Little Help needed to walk in hospital room?: Total Help needed climbing 3-5 steps with a railing? : Total 6 Click Score: 16    End of Session Equipment Utilized During Treatment: Gait belt Activity Tolerance: Patient limited by pain Patient left: in bed;with call bell/phone within reach   PT Visit Diagnosis: Other abnormalities of gait and mobility (R26.89);Pain Pain - Right/Left: Right Pain - part of body: Leg;Hip    Time: 4854-6270 PT Time Calculation (min) (ACUTE ONLY): 33 min   Charges:   PT Evaluation $PT Eval Moderate Complexity: 1 Mod PT Treatments $Gait Training: 8-22 mins  Magda Kiel, PT Acute Rehabilitation Services Office:[phone] 08/07/2022   Reginia Naas 08/07/2022, 5:07 PM

## 2022-08-07 NOTE — Progress Notes (Signed)
Pharmacy Antibiotic Note  [name] is a 47 y.o. female admitted on 08/06/2022 with pneumonia.  Pharmacy has been consulted for Vancomycin dosing.  Originally treated with ceftriaxone and azithromycin for CAP; however, patient experienced worsening ankle pain today. MD received message from patient's outpatient nephrology provider regarding positive blood cultures for 4 of 4 GPC without species identification. Plan to keep overnight for antibiotic continuation while blood cultures are re-drawn.  Plan: Vancomycin 1000mg  IV once then 500mg  with HD TTS Continue ceftriaxone and azithromycin for CAP coverage  Patient received cefazolin 2g at 1600 which will cover for at least 24 hours.  Height: 5\' 3"  (160 cm) Weight: 55 kg (121 lb 4.1 oz) IBW/kg (Calculated) : 52.4  Temp (24hrs), Avg:99.3 F (37.4 C), Min:97.7 F (36.5 C), Max:103 F (39.4 C)  Recent Labs  Lab 08/06/22 1154 08/07/22 0458  WBC 7.8 8.7  CREATININE 7.36* 4.67*    Estimated Creatinine Clearance: 12.7 mL/min (A) (by C-G formula based on SCr of 4.67 mg/dL (H)).    Allergies  Allergen Reactions   Pollen Extract Other (See Comments)    Watery eyes    Thank you for allowing pharmacy to be a part of this patient's care.  Merrilee Jansky, PharmD Clinical Pharmacist 08/07/2022 5:45 PM

## 2022-08-07 NOTE — TOC Initial Note (Signed)
Transition of Care [facility]) - Initial/Assessment Note    Patient Details  Name: [name] MRN: [id number] Date of Birth: 1975-07-23  Transition of [facility] CM/SW Contact:    Marilu Favre, RN Phone Number: 08/07/2022, 5:01 PM  Clinical Narrative:                 Patient from home with daughter. PT recommending HHPT. Patient agreeable. No preference in agency. Claiborne Billings with [facility] accepted referral   Ordered walker with Erasmo Downer with [facility]   Expected Discharge Plan: [place] Barriers to Discharge: Continued Medical Work up   Patient Goals and CMS Choice Patient states their goals for this hospitalization and ongoing recovery are:: to return to home CMS Medicare.gov Compare Post Acute Care list provided to:: Patient Choice offered to / list presented to : Patient      Expected Discharge Plan and Services   Discharge Planning Services: CM Consult Post Acute Care Choice: Home Health, Durable Medical Equipment Living arrangements for the past 2 months: Single Family Home Expected Discharge Date: 08/07/22               DME Arranged: Gilford Rile rolling DME Agency: [employer] Date DME Agency Contacted: 08/07/22 Time DME Agency Contacted: 1700 Representative spoke with at DME Agency: [place]: PT [place]: [place] Date [place]: 08/07/22 Time [place]: 1700 Representative spoke with at [place]: [place] Arrangements/Services Living arrangements for the past 2 months: [place]   Patient language and need for interpreter reviewed:: Yes Do you feel safe going back to the place where you live?: Yes      Need for Family Participation in Patient Care: Yes (Comment) Care giver support system in place?: Yes (comment)   Criminal Activity/Legal Involvement Pertinent to Current Situation/Hospitalization: No - Comment as needed  Activities of Daily Living Home Assistive  Devices/Equipment: Insulin Pump ADL Screening (condition at time of admission) Patient's cognitive ability adequate to safely complete daily activities?: Yes Is the patient deaf or have difficulty hearing?: No Does the patient have difficulty seeing, even when wearing glasses/contacts?: No Does the patient have difficulty concentrating, remembering, or making decisions?: No Patient able to express need for assistance with ADLs?: Yes Does the patient have difficulty dressing or bathing?: No Independently performs ADLs?: Yes (appropriate for developmental age) Does the patient have difficulty walking or climbing stairs?: Yes Weakness of Legs: Both Weakness of Arms/Hands: Both  Permission Sought/Granted   Permission granted to share information with : No              Emotional Assessment Appearance:: Appears stated age Attitude/Demeanor/Rapport: Engaged Affect (typically observed): Accepting Orientation: : Oriented to Self, Oriented to Place, Oriented to  Time, Oriented to Situation Alcohol / Substance Use: Not Applicable Psych Involvement: No (comment)  Admission diagnosis:  Hypoglycemia [E16.2] Thrombocytopenia ([facility]) [D69.6] CAP (community acquired pneumonia) [J18.9] Community acquired pneumonia of right middle lobe of lung [J18.9] Patient Active Problem List   Diagnosis Date Noted   CAP (community acquired pneumonia) 08/06/2022   Visit for wound check 07/26/2020   Pleural effusion    Difficulty breathing 05/30/2020   Exudative pleural effusion    History of CVA (cerebrovascular accident) 03/11/2017   Hyperparathyroidism, secondary renal ([place])    ESRD on hemodialysis ([place]) 09/12/2014   Diabetes mellitus type I ([place]) 09/12/2014   History of diabetic retinopathy 09/12/2014   Cerebral infarction due to unspecified mechanism 05/22/2014  Essential hypertension, benign 05/22/2014   DM (diabetes mellitus) ([place]) 05/22/2014   HLD (hyperlipidemia) 05/22/2014   Cerebral  infarction ([place]) 03/29/2014   Facial droop 03/29/2014   Mechanical complication of other vascular device, implant, and graft 01/12/2013   Anxiety disorder, unspecified 05/11/2012   PCP:  Benito Mccreedy, MD Pharmacy:   [facility], [place] [address] Phone: [phone] Fax: [id number] - [place], [place] - [address] AT [place] [address] [place] [address] Phone: [phone] Fax: [phone]     Social Determinants of Health (SDOH) Social History: [place]: No Food Insecurity (08/07/2022)  Housing: Low Risk  (08/07/2022)  Transportation Needs: No Transportation Needs (08/07/2022)  Utilities: Not At Risk (08/07/2022)  Depression (PHQ2-9): Low Risk  (03/28/2020)  Tobacco Use: Low Risk  (08/06/2022)   SDOH Interventions:     Readmission Risk Interventions    06/03/2020    3:58 PM  Readmission Risk Prevention Plan  Transportation Screening Complete  PCP or Specialist Appt within 5-7 Days Complete  Medication Review (RN CM) Complete

## 2022-08-07 NOTE — Hospital Course (Addendum)
[name] is a 47 yo female with PMH ESRD on HD, HTN, HLD, anxiety, DM II, CVA who presented with shortness of breath, weakness.  CXR on admission was concerning for right middle lobe opacity consistent with pneumonia.  She was started on Rocephin and azithromycin. After admission, we were informed that outpatient blood cultures from 08/05/2022 became positive for GPC, 4/4 bottles which have now speciated to MSSA. Cultures were obtained due to chills she developed during dialysis session.

## 2022-08-07 NOTE — Progress Notes (Signed)
D/C order noted. Contacted [facility] to advise clinic of pt's d/c today and that pt should resume care on Saturday.   Melven Sartorius Renal Navigator [phone]

## 2022-08-07 NOTE — Discharge Summary (Signed)
Physician Discharge Summary   DENNIS KILLILEA CVE:[id number] DOB: [birth year] DOA: 08/06/2022  PCP: Benito Mccreedy, MD  Admit date: 08/06/2022 Discharge date: 08/07/2022 Barriers to discharge: none  Admitted From: Home Disposition:  Home Discharging physician: Dwyane Dee, MD  Recommendations for Outpatient Follow-up:  Continue outpatient HD  Repeat LFTs to ensure further downtrend  Home Health:  Equipment/Devices:   Discharge Condition: stable CODE STATUS: Full Diet recommendation:  Diet Orders (From admission, onward)     Start     Ordered   08/06/22 1726  Diet renal/carb modified with fluid restriction Diet-HS Snack? Nothing; Fluid restriction: 1200 mL Fluid; Room service appropriate? Yes; Fluid consistency: Thin  Diet effective now       Question Answer Comment  Diet-HS Snack? Nothing   Fluid restriction: 1200 mL Fluid   Room service appropriate? Yes   Fluid consistency: Thin      08/06/22 1726            Hospital Course:  CAP Acute respiratory failure with hypoxia > Patient presenting with shortness of breath.  Found to have chest x-ray consistent with right middle lobe pneumonia and associated right pleural effusion.  Saturating 78% on room air on arrival, improved to the 90s on 1 to 2 L - some possible contribution from volume overload as well - weaned to RA prior to discharge; walk test to be performed as well - s/p rocephin and azithro - transition to amoxicillin and doxy at discharge to complete 5 day course    Transaminitis > Patient noted to have significant transaminitis with AST of 720 and ALT of 338.  Also has had some recent diarrhea.  States she ate pizza prior to diarrhea symptoms, which have improved. - negative hepatitis panel - suspect possible elevation in setting of infection and/or vascular congestion - LFTs improving at time of discharge    Thrombocytopenia - Unclear etiology possibly related to infection as well - repeat CBC  at followup   ESRD on HD - dialyzed on 1/17 on admission - patient declined session on 1/18 and wished to return to normal schedule outpatient  Diabetes > Did have episode of hypoglycemia in the ED as she has continued on her insulin pump and has not been eating.  She received D50 and her insulin pump is been held temporarily. - Plan for patient to resume insulin pump once she begins eating   History of CVA - Continue home ASA   Hypertension - Continue home amlodipine and labetalol 2   The patient's chronic medical conditions were treated accordingly per the patient's home medication regimen except as noted.  On day of discharge, patient was felt deemed stable for discharge. Patient/family member advised to call PCP or come back to ER if needed.   Principal Diagnosis: CAP (community acquired pneumonia)  Discharge Diagnoses: Active Hospital Problems   Diagnosis Date Noted   CAP (community acquired pneumonia) 08/06/2022    Priority: 1.   History of CVA (cerebrovascular accident) 03/11/2017   ESRD on hemodialysis ([place]) 09/12/2014   Essential hypertension, benign 05/22/2014   DM (diabetes mellitus) ([place]) 05/22/2014    Resolved Hospital Problems   Diagnosis Date Noted Date Resolved   Acute respiratory failure with hypoxia ([place]) 08/06/2022 08/07/2022    Priority: 2.     Discharge Instructions     Increase activity slowly   Complete by: As directed    No wound care   Complete by: As directed       Allergies as of  08/07/2022       Reactions   Pollen Extract Other (See Comments)   Watery eyes        Medication List     TAKE these medications    amLODipine 10 MG tablet Commonly known as: NORVASC Take 10 mg by mouth in the morning.   amoxicillin 875 MG tablet Commonly known as: AMOXIL Take 1 tablet (875 mg total) by mouth 2 (two) times daily for 5 days.   aspirin EC 81 MG tablet Take 81 mg by mouth in the morning.   doxycycline 100 MG capsule Commonly known  as: VIBRAMYCIN Take 1 capsule (100 mg total) by mouth 2 (two) times daily for 5 days.   FLINTSTONES GUMMIES PO Take 2 tablets by mouth in the morning.   fluticasone 50 MCG/ACT nasal spray Commonly known as: FLONASE Place 1-2 sprays into both nostrils daily as needed for allergies.   Fosrenol 1000 MG Pack Generic drug: Lanthanum Carbonate Take 2,000 mg by mouth with breakfast, with lunch, and with evening meal.   labetalol 300 MG tablet Commonly known as: NORMODYNE Take 300 mg by mouth in the morning.   loratadine 10 MG tablet Commonly known as: CLARITIN Take 10 mg by mouth daily as needed for allergies.   NovoLOG 100 UNIT/ML injection Generic drug: insulin aspart Inject 150 Units into the skin See admin instructions. 150 units over 7 days   rosuvastatin 10 MG tablet Commonly known as: CRESTOR Take 10 mg by mouth at bedtime.        Allergies  Allergen Reactions   Pollen Extract Other (See Comments)    Watery eyes    Consultations: Nephrology  Procedures:   Discharge Exam: BP 132/68 (BP Location: Left Arm)   Pulse 87   Temp 97.7 F (36.5 C) (Oral)   Resp 17   Ht 5\' 3"  (1.6 m)   Wt 55 kg   SpO2 90%   BMI 21.48 kg/m  Physical Exam Constitutional:      Appearance: She is well-developed.  HENT:     Head: Normocephalic and atraumatic.     Mouth/Throat:     Mouth: Mucous membranes are moist.  Eyes:     Extraocular Movements: Extraocular movements intact.  Cardiovascular:     Rate and Rhythm: Normal rate and regular rhythm.  Pulmonary:     Effort: Pulmonary effort is normal.     Breath sounds: Normal breath sounds. No wheezing.  Abdominal:     General: Bowel sounds are normal. There is no distension.     Palpations: Abdomen is soft.     Tenderness: There is no abdominal tenderness.  Musculoskeletal:        General: No swelling. Normal range of motion.     Cervical back: Normal range of motion and neck supple.  Skin:    General: Skin is warm and  dry.  Neurological:     General: No focal deficit present.     Mental Status: She is alert.  Psychiatric:        Mood and Affect: Mood normal.      The results of significant diagnostics from this hospitalization (including imaging, microbiology, ancillary and laboratory) are listed below for reference.   Microbiology: Recent Results (from the past 240 hour(s))  Resp panel by RT-PCR (RSV, Flu A&B, Covid) Anterior Nasal Swab     Status: None   Collection Time: 08/06/22 11:50 AM   Specimen: Anterior Nasal Swab  Result Value Ref Range Status   SARS  Coronavirus 2 by RT PCR NEGATIVE NEGATIVE Final    Comment: (NOTE) SARS-CoV-2 target nucleic acids are NOT DETECTED.  The SARS-CoV-2 RNA is generally detectable in upper respiratory specimens during the acute phase of infection. The lowest concentration of SARS-CoV-2 viral copies this assay can detect is 138 copies/mL. A negative result does not preclude SARS-Cov-2 infection and should not be used as the sole basis for treatment or other patient management decisions. A negative result may occur with  improper specimen collection/handling, submission of specimen other than nasopharyngeal swab, presence of viral mutation(s) within the areas targeted by this assay, and inadequate number of viral copies(<138 copies/mL). A negative result must be combined with clinical observations, patient history, and epidemiological information. The expected result is Negative.  Fact Sheet for Patients:  EntrepreneurPulse.com.au  Fact Sheet for Healthcare Providers:  IncredibleEmployment.be  This test is no t yet approved or cleared by the [place] FDA and  has been authorized for detection and/or diagnosis of SARS-CoV-2 by FDA under an Emergency Use Authorization (EUA). This EUA will remain  in effect (meaning this test can be used) for the duration of the COVID-19 declaration under Section 564(b)(1) of the  Act, 21 U.S.C.section 360bbb-3(b)(1), unless the authorization is terminated  or revoked sooner.       Influenza A by PCR NEGATIVE NEGATIVE Final   Influenza B by PCR NEGATIVE NEGATIVE Final    Comment: (NOTE) The Xpert Xpress SARS-CoV-2/FLU/RSV plus assay is intended as an aid in the diagnosis of influenza from Nasopharyngeal swab specimens and should not be used as a sole basis for treatment. Nasal washings and aspirates are unacceptable for Xpert Xpress SARS-CoV-2/FLU/RSV testing.  Fact Sheet for Patients: EntrepreneurPulse.com.au  Fact Sheet for Healthcare Providers: IncredibleEmployment.be  This test is not yet approved or cleared by the [place] FDA and has been authorized for detection and/or diagnosis of SARS-CoV-2 by FDA under an Emergency Use Authorization (EUA). This EUA will remain in effect (meaning this test can be used) for the duration of the COVID-19 declaration under Section 564(b)(1) of the Act, 21 U.S.C. section 360bbb-3(b)(1), unless the authorization is terminated or revoked.     Resp Syncytial Virus by PCR NEGATIVE NEGATIVE Final    Comment: (NOTE) Fact Sheet for Patients: EntrepreneurPulse.com.au  Fact Sheet for Healthcare Providers: IncredibleEmployment.be  This test is not yet approved or cleared by the [place] FDA and has been authorized for detection and/or diagnosis of SARS-CoV-2 by FDA under an Emergency Use Authorization (EUA). This EUA will remain in effect (meaning this test can be used) for the duration of the COVID-19 declaration under Section 564(b)(1) of the Act, 21 U.S.C. section 360bbb-3(b)(1), unless the authorization is terminated or revoked.  Performed at [facility], [place] [address], [place], [place] [address]   MRSA Next Gen by PCR, Nasal     Status: None   Collection Time: 08/07/22  2:59 AM   Specimen: Nasal Mucosa; Nasal Swab  Result  Value Ref Range Status   MRSA by PCR Next Gen NOT DETECTED NOT DETECTED Final    Comment: (NOTE) The GeneXpert MRSA Assay (FDA approved for NASAL specimens only), is one component of a comprehensive MRSA colonization surveillance program. It is not intended to diagnose MRSA infection nor to guide or monitor treatment for MRSA infections. Test performance is not FDA approved in patients less than 21 years old. Performed at [facility], [place] [address] [place], [place] [address]      Labs:  BNP (last 3 results) Recent Labs    08/06/22 1154  BNP 3,295.1*   Basic Metabolic Panel: Recent Labs  Lab 08/06/22 1154 08/06/22 2025 08/07/22 0458  NA 136  --  134*  K 4.0  --  3.7  CL 92*  --  92*  CO2 28  --  22  GLUCOSE 101*  --  126*  BUN 28*  --  17  CREATININE 7.36*  --  4.67*  CALCIUM 8.5*  --  9.0  PHOS  --  7.1*  --    Liver Function Tests: Recent Labs  Lab 08/06/22 1154 08/07/22 0458  AST 720* 353*  ALT 338* 266*  ALKPHOS 49 48  BILITOT 0.9 1.4*  PROT 6.6 6.6  ALBUMIN 3.4* 3.2*   Recent Labs  Lab 08/06/22 1154  LIPASE 24   No results for input(s): "AMMONIA" in the last 168 hours. CBC: Recent Labs  Lab 08/06/22 1154 08/07/22 0458  WBC 7.8 8.7  NEUTROABS 6.3  --   HGB 10.1* 8.8*  HCT 32.5* 28.3*  MCV 88.3 87.9  PLT 66* 87*   Cardiac Enzymes: No results for input(s): "CKTOTAL", "CKMB", "CKMBINDEX", "TROPONINI" in the last 168 hours. BNP: Invalid input(s): "POCBNP" CBG: Recent Labs  Lab 08/06/22 1435 08/06/22 1547 08/07/22 0246 08/07/22 0952 08/07/22 1217  GLUCAP 24* 88 109* 169* 187*   D-Dimer No results for input(s): "DDIMER" in the last 72 hours. Hgb A1c No results for input(s): "HGBA1C" in the last 72 hours. Lipid Profile No results for input(s): "CHOL", "HDL", "LDLCALC", "TRIG", "CHOLHDL", "LDLDIRECT" in the last 72 hours. Thyroid function studies No results for input(s): "TSH", "T4TOTAL", "T3FREE", "THYROIDAB" in the last 72  hours.  Invalid input(s): "FREET3" Anemia work up No results for input(s): "VITAMINB12", "FOLATE", "FERRITIN", "TIBC", "IRON", "RETICCTPCT" in the last 72 hours. Urinalysis    Component Value Date/Time   COLORURINE YELLOW 03/29/2014 1734   APPEARANCEUR CLEAR 03/29/2014 1734   LABSPEC 1.015 03/29/2014 1734   PHURINE 8.0 03/29/2014 1734   GLUCOSEU >1000 (A) 03/29/2014 1734   HGBUR SMALL (A) 03/29/2014 1734   BILIRUBINUR NEGATIVE 03/29/2014 1734   KETONESUR NEGATIVE 03/29/2014 1734   PROTEINUR 100 (A) 03/29/2014 1734   UROBILINOGEN 0.2 03/29/2014 1734   NITRITE NEGATIVE 03/29/2014 1734   LEUKOCYTESUR TRACE (A) 03/29/2014 1734   Sepsis Labs Recent Labs  Lab 08/06/22 1154 08/07/22 0458  WBC 7.8 8.7   Microbiology Recent Results (from the past 240 hour(s))  Resp panel by RT-PCR (RSV, Flu A&B, Covid) Anterior Nasal Swab     Status: None   Collection Time: 08/06/22 11:50 AM   Specimen: Anterior Nasal Swab  Result Value Ref Range Status   SARS Coronavirus 2 by RT PCR NEGATIVE NEGATIVE Final    Comment: (NOTE) SARS-CoV-2 target nucleic acids are NOT DETECTED.  The SARS-CoV-2 RNA is generally detectable in upper respiratory specimens during the acute phase of infection. The lowest concentration of SARS-CoV-2 viral copies this assay can detect is 138 copies/mL. A negative result does not preclude SARS-Cov-2 infection and should not be used as the sole basis for treatment or other patient management decisions. A negative result may occur with  improper specimen collection/handling, submission of specimen other than nasopharyngeal swab, presence of viral mutation(s) within the areas targeted by this assay, and inadequate number of viral copies(<138 copies/mL). A negative result must be combined with clinical observations, patient history, and epidemiological information. The expected result is Negative.  Fact Sheet for Patients:  EntrepreneurPulse.com.au  Fact  Sheet for Healthcare Providers:  IncredibleEmployment.be  This test is no t yet approved or cleared by the [place] FDA and  has been authorized for detection and/or diagnosis of SARS-CoV-2 by FDA under an Emergency Use Authorization (EUA). This EUA will remain  in effect (meaning this test can be used) for the duration of the COVID-19 declaration under Section 564(b)(1) of the Act, 21 U.S.C.section 360bbb-3(b)(1), unless the authorization is terminated  or revoked sooner.       Influenza A by PCR NEGATIVE NEGATIVE Final   Influenza B by PCR NEGATIVE NEGATIVE Final    Comment: (NOTE) The Xpert Xpress SARS-CoV-2/FLU/RSV plus assay is intended as an aid in the diagnosis of influenza from Nasopharyngeal swab specimens and should not be used as a sole basis for treatment. Nasal washings and aspirates are unacceptable for Xpert Xpress SARS-CoV-2/FLU/RSV testing.  Fact Sheet for Patients: EntrepreneurPulse.com.au  Fact Sheet for Healthcare Providers: IncredibleEmployment.be  This test is not yet approved or cleared by the [place] FDA and has been authorized for detection and/or diagnosis of SARS-CoV-2 by FDA under an Emergency Use Authorization (EUA). This EUA will remain in effect (meaning this test can be used) for the duration of the COVID-19 declaration under Section 564(b)(1) of the Act, 21 U.S.C. section 360bbb-3(b)(1), unless the authorization is terminated or revoked.     Resp Syncytial Virus by PCR NEGATIVE NEGATIVE Final    Comment: (NOTE) Fact Sheet for Patients: EntrepreneurPulse.com.au  Fact Sheet for Healthcare Providers: IncredibleEmployment.be  This test is not yet approved or cleared by the [place] FDA and has been authorized for detection and/or diagnosis of SARS-CoV-2 by FDA under an Emergency Use Authorization (EUA). This EUA will remain in effect  (meaning this test can be used) for the duration of the COVID-19 declaration under Section 564(b)(1) of the Act, 21 U.S.C. section 360bbb-3(b)(1), unless the authorization is terminated or revoked.  Performed at [facility], [place] [address], [place], [place] [address]   MRSA Next Gen by PCR, Nasal     Status: None   Collection Time: 08/07/22  2:59 AM   Specimen: Nasal Mucosa; Nasal Swab  Result Value Ref Range Status   MRSA by PCR Next Gen NOT DETECTED NOT DETECTED Final    Comment: (NOTE) The GeneXpert MRSA Assay (FDA approved for NASAL specimens only), is one component of a comprehensive MRSA colonization surveillance program. It is not intended to diagnose MRSA infection nor to guide or monitor treatment for MRSA infections. Test performance is not FDA approved in patients less than 63 years old. Performed at [facility], [place] [address], [place], [place] [address]     Procedures/Studies: DG Chest 2 View  Result Date: 08/06/2022 CLINICAL DATA:  Shortness of breath EXAM: CHEST - 2 VIEW COMPARISON:  Chest x-ray March 08, 2021 FINDINGS: Unchanged cardiomegaly. Stable mediastinal contours. Confluent opacity in the right middle lobe. Moderate right pleural effusion. No focal opacity in the left lung. No large pneumothorax. The visualized upper abdomen is unremarkable. No acute osseous abnormality. IMPRESSION: 1. Right middle lobe confluent opacity, concerning for pneumonia. Recommend treatment of acute illness and repeat chest radiograph in 6-8 weeks to ensure resolution and exclude underlying malignancy. 2. Moderate right pleural effusion. Electronically Signed   By: Beryle Flock M.D.   On: 08/06/2022 13:22     Time coordinating discharge: Over 57 minutes    Dwyane Dee, MD  [facility] 08/07/2022, 2:10 PM

## 2022-08-07 NOTE — Progress Notes (Signed)
Progress Note    [name]   RWE:[id number]  DOB: 05-01-1976  DOA: 08/06/2022     0 PCP: Benito Mccreedy, MD  Initial CC: SOB  Hospital Course: [name] is a 47 yo female with PMH ESRD on HD, HTN, HLD, anxiety, DM II, CVA who presented with shortness of breath, weakness.  After admission, she also admitted that her right ankle began hurting while she was in the ER. She had chills during dialysis session on Tuesday and underwent testing with blood cultures.  After admission, it was learned that blood cultures were positive for 4/4 bottles with GPC. Initially she underwent workup and was found to have right middle lobe opacity on CXR and was admitted for treatment of community-acquired pneumonia. Due to worsening right ankle pain and blood cultures returning positive, discharge was postponed and she was kept for further workup.  Interval History:  No events overnight.  When seen initially this morning she was amenable with going home.  However, later in the day she developed worsening ankle pain and could not bear significant weight.  She ambulated some with physical therapy. Outpatient cultures positive for GPC.   Assessment and Plan:  CAP Acute respiratory failure with hypoxia > Patient presenting with shortness of breath.  Found to have chest x-ray consistent with right middle lobe pneumonia and associated right pleural effusion.  Saturating 78% on room air on arrival, improved to the 90s on 1 to 2 L - some possible contribution from volume overload as well - weaned to RA prior to discharge; walk test to be performed as well - s/p rocephin and azithro on admission; going to continue this since patient remaining in hospital   Gram positive bacteremia - contacted by Dr. Moshe Cipro this afternoon and patient is growing 4/4 bottles of GPC - given 2 g Ancef afternoon of 1/18 - since unclear if MRSA or not, will start Vanc now for coverage and continue rocephin/azithro for  PNA coverage as above  Right ankle pain - patient developed worsening Right ankle pain starting 1/17 (in the ER) and this progressed such that she could not ambulate enough to discharge home - CT right ankle ordered given bacteremia and acute onset of pain; suspicion would be septic arthritis (she has no other pain in any other joints)   Transaminitis > Patient noted to have significant transaminitis with AST of 720 and ALT of 338.  Also has had some recent diarrhea.  States she ate pizza prior to diarrhea symptoms, which have improved. - negative hepatitis panel - suspect possible elevation in setting of infection and/or vascular congestion - LFTs improving at time of discharge    Thrombocytopenia - Unclear etiology possibly related to infection as well - trend CBC   ESRD on HD - dialyzed on 1/17 on admission - will need ongoing HD if remains inpatient, but will see how workup above goes   Diabetes > Did have episode of hypoglycemia in the ED as she has continued on her insulin pump and has not been eating.  She received D50 and her insulin pump is been held temporarily. - Plan for patient to resume insulin pump once she begins eating   History of CVA - Continue home ASA   Hypertension - Continue home amlodipine and labetalol 2   Old records reviewed in assessment of this patient  Antimicrobials: Rocephin 1/17 >> current Azithro 1/18 >> current Vanc 1/18 >> current   DVT prophylaxis:  SCDs Start: 08/06/22 1722   Code  Status:   Code Status: Full Code  Mobility Assessment (last 72 hours)     Mobility Assessment     Row Name 08/07/22 1704 08/07/22 0251         Does patient have an order for bedrest or is patient medically unstable -- No - Continue assessment      What is the highest level of mobility based on the progressive mobility assessment? Level 4 (Walks with assist in room) - Balance while marching in place and cannot step forward and back - Complete Level 2  (Chairfast) - Balance while sitting on edge of bed and cannot stand      Is the above level different from baseline mobility prior to current illness? -- Yes - Recommend PT order               Barriers to discharge: none Disposition Plan:  Home 2-3 days Status is: Inpatient  Objective: Blood pressure 132/68, pulse 87, temperature 97.7 F (36.5 C), temperature source Oral, resp. rate 17, height 5\' 3"  (1.6 m), weight 55 kg, SpO2 90 %.  Examination:  Physical Exam Constitutional:      General: She is not in acute distress.    Appearance: She is well-developed.  HENT:     Head: Normocephalic and atraumatic.     Mouth/Throat:     Mouth: Mucous membranes are moist.  Eyes:     Extraocular Movements: Extraocular movements intact.  Cardiovascular:     Rate and Rhythm: Normal rate and regular rhythm.  Pulmonary:     Effort: Pulmonary effort is normal. No respiratory distress.     Breath sounds: Rhonchi present.  Abdominal:     General: Bowel sounds are normal. There is no distension.     Palpations: Abdomen is soft.     Tenderness: There is no abdominal tenderness.  Musculoskeletal:        General: No swelling.     Cervical back: Normal range of motion.     Comments: Painful passive ROM in right ankle with TTP noted in lateral malleolus   Skin:    General: Skin is warm and dry.  Neurological:     General: No focal deficit present.     Mental Status: She is alert.  Psychiatric:        Mood and Affect: Mood normal.      Consultants:  Nephrology  Procedures:    Data Reviewed: Results for orders placed or performed during the hospital encounter of 08/06/22 (from the past 24 hour(s))  Hepatitis panel, acute     Status: None   Collection Time: 08/06/22  5:20 PM  Result Value Ref Range   Hepatitis B Surface Ag NON REACTIVE NON REACTIVE   HCV Ab NON REACTIVE NON REACTIVE   Hep A IgM NON REACTIVE NON REACTIVE   Hep B C IgM NON REACTIVE NON REACTIVE  HIV Antibody (routine  testing w rflx)     Status: None   Collection Time: 08/06/22  5:20 PM  Result Value Ref Range   HIV Screen 4th Generation wRfx Non Reactive Non Reactive  Hepatitis B surface antigen     Status: None   Collection Time: 08/06/22  8:25 PM  Result Value Ref Range   Hepatitis B Surface Ag NON REACTIVE NON REACTIVE  Phosphorus     Status: Abnormal   Collection Time: 08/06/22  8:25 PM  Result Value Ref Range   Phosphorus 7.1 (H) 2.5 - 4.6 mg/dL  Glucose, capillary  Status: Abnormal   Collection Time: 08/07/22  2:46 AM  Result Value Ref Range   Glucose-Capillary 109 (H) 70 - 99 mg/dL  MRSA Next Gen by PCR, Nasal     Status: None   Collection Time: 08/07/22  2:59 AM   Specimen: Nasal Mucosa; Nasal Swab  Result Value Ref Range   MRSA by PCR Next Gen NOT DETECTED NOT DETECTED  CBC     Status: Abnormal   Collection Time: 08/07/22  4:58 AM  Result Value Ref Range   WBC 8.7 4.0 - 10.5 K/uL   RBC 3.22 (L) 3.87 - 5.11 MIL/uL   Hemoglobin 8.8 (L) 12.0 - 15.0 g/dL   HCT 28.3 (L) 36.0 - 46.0 %   MCV 87.9 80.0 - 100.0 fL   MCH 27.3 26.0 - 34.0 pg   MCHC 31.1 30.0 - 36.0 g/dL   RDW 17.7 (H) 11.5 - 15.5 %   Platelets 87 (L) 150 - 400 K/uL   nRBC 0.0 0.0 - 0.2 %  Comprehensive metabolic panel     Status: Abnormal   Collection Time: 08/07/22  4:58 AM  Result Value Ref Range   Sodium 134 (L) 135 - 145 mmol/L   Potassium 3.7 3.5 - 5.1 mmol/L   Chloride 92 (L) 98 - 111 mmol/L   CO2 22 22 - 32 mmol/L   Glucose, Bld 126 (H) 70 - 99 mg/dL   BUN 17 6 - 20 mg/dL   Creatinine, Ser 4.67 (H) 0.44 - 1.00 mg/dL   Calcium 9.0 8.9 - 10.3 mg/dL   Total Protein 6.6 6.5 - 8.1 g/dL   Albumin 3.2 (L) 3.5 - 5.0 g/dL   AST 353 (H) 15 - 41 U/L   ALT 266 (H) 0 - 44 U/L   Alkaline Phosphatase 48 38 - 126 U/L   Total Bilirubin 1.4 (H) 0.3 - 1.2 mg/dL   GFR, Estimated 11 (L) >60 mL/min   Anion gap 20 (H) 5 - 15  Glucose, capillary     Status: Abnormal   Collection Time: 08/07/22  9:52 AM  Result Value Ref  Range   Glucose-Capillary 169 (H) 70 - 99 mg/dL  Glucose, capillary     Status: Abnormal   Collection Time: 08/07/22 12:17 PM  Result Value Ref Range   Glucose-Capillary 187 (H) 70 - 99 mg/dL    I have reviewed pertinent nursing notes, vitals, labs, and images as necessary. I have ordered labwork to follow up on as indicated.  I have reviewed the last notes from staff over past 24 hours. I have discussed patient's care plan and test results with nursing staff, CM/SW, and other staff as appropriate.  Time spent: Greater than 50% of the 55 minute visit was spent in counseling/coordination of care for the patient as laid out in the A&P.   LOS: 0 days   Dwyane Dee, MD [facility] 08/07/2022, 5:11 PM

## 2022-08-07 NOTE — Progress Notes (Signed)
   08/07/22 0259  Assess: MEWS Score  Temp (!) 101.8 F (38.8 C)  BP (!) 157/71  MAP (mmHg) 96  Pulse Rate 96  Resp 17  SpO2 96 %  O2 Device Room Air  Assess: MEWS Score  MEWS Temp 2  MEWS Systolic 0  MEWS Pulse 0  MEWS RR 0  MEWS LOC 0  MEWS Score 2  MEWS Score Color Yellow  Assess: if the MEWS score is Yellow or Red  Were vital signs taken at a resting state? Yes  Focused Assessment No change from prior assessment  Does the patient meet 2 or more of the SIRS criteria? Yes  Does the patient have a confirmed or suspected source of infection? Yes  Provider and Rapid Response Notified? No  MEWS guidelines implemented *See Row Information* Yes  Treat  MEWS Interventions Administered scheduled meds/treatments  Pain Scale 0-10  Pain Score 0  Take Vital Signs  Increase Vital Sign Frequency  Yellow: Q 2hr X 2 then Q 4hr X 2, if remains yellow, continue Q 4hrs  Escalate  MEWS: Escalate Yellow: discuss with charge nurse/RN and consider discussing with provider and RRT  Notify: Charge Nurse/RN  Name of Charge Nurse/RN Notified Rande Brunt  Date Charge Nurse/RN Notified 08/07/22  Time Charge Nurse/RN Notified 0326  Provider Notification  Provider Name/Title Miles Costain  Date Provider Notified 08/07/22  Time Provider Notified 0326  Method of Notification Page  Notification Reason Other (Comment) (yellow mews)  Provider response See new orders (give feedback if temp does not go down)  Date of Provider Response 08/07/22  Time of Provider Response 0327  Document  Progress note created (see row info) Yes  Assess: SIRS CRITERIA  SIRS Temperature  1  SIRS Pulse 1  SIRS Respirations  0  SIRS WBC 0  SIRS Score Sum  2

## 2022-08-07 NOTE — Progress Notes (Signed)
Mobility Specialist - Progress Note   08/07/22 1400  Mobility  Activity Ambulated with assistance in room  Level of Assistance Moderate assist, patient does 50-74%  Assistive Device  (HHA)  Distance Ambulated (ft) 5 ft  Activity Response Tolerated fair  $Mobility charge 1 Mobility   Pre-mobility: SpO2 90% During mobility: SpO2 88% Post-mobility: SpO2 95%  Pt received in bed agreeable to mobility after encouragement. When putting weight onto RLE, c/o 10/10 pain. Stated the pain began after her blood pressure was taken using that leg. Pt avoided using RLE d/t pain, causing unsteadiness that required ModA to correct. Deferred further mobility after taking steps at bedside. Left sitting EOB w/ all needs met.     [facility] Please contact via SecureChat or Rehab office at [phone]

## 2022-08-07 NOTE — Discharge Summary (Incomplete Revision)
Physician Discharge Summary   [name] BDZ:[id number] DOB: [birth year] DOA: 08/06/2022  PCP: Benito Mccreedy, MD  Admit date: 08/06/2022 Discharge date: 08/07/2022 Barriers to discharge: none  Admitted From: Home Disposition:  Home Discharging physician: Dwyane Dee, MD  Recommendations for Outpatient Follow-up:  Continue outpatient HD  Repeat LFTs to ensure further downtrend  Home Health:  Equipment/Devices:   Discharge Condition: stable CODE STATUS: Full Diet recommendation:  Diet Orders (From admission, onward)     Start     Ordered   08/06/22 1726  Diet renal/carb modified with fluid restriction Diet-HS Snack? Nothing; Fluid restriction: 1200 mL Fluid; Room service appropriate? Yes; Fluid consistency: Thin  Diet effective now       Question Answer Comment  Diet-HS Snack? Nothing   Fluid restriction: 1200 mL Fluid   Room service appropriate? Yes   Fluid consistency: Thin      08/06/22 1726            Hospital Course:  CAP Acute respiratory failure with hypoxia > Patient presenting with shortness of breath.  Found to have chest x-ray consistent with right middle lobe pneumonia and associated right pleural effusion.  Saturating 78% on room air on arrival, improved to the 90s on 1 to 2 L - some possible contribution from volume overload as well - weaned to RA prior to discharge; walk test to be performed as well - s/p rocephin and azithro - transition to amoxicillin and doxy at discharge to complete 5 day course    Transaminitis > Patient noted to have significant transaminitis with AST of 720 and ALT of 338.  Also has had some recent diarrhea.  States she ate pizza prior to diarrhea symptoms, which have improved. - negative hepatitis panel - suspect possible elevation in setting of infection and/or vascular congestion - LFTs improving at time of discharge    Thrombocytopenia - Unclear etiology possibly related to infection as well - repeat CBC  at followup   ESRD on HD - dialyzed on 1/17 on admission - patient declined session on 1/18 and wished to return to normal schedule outpatient  Diabetes > Did have episode of hypoglycemia in the ED as she has continued on her insulin pump and has not been eating.  She received D50 and her insulin pump is been held temporarily. - Plan for patient to resume insulin pump once she begins eating   History of CVA - Continue home ASA   Hypertension - Continue home amlodipine and labetalol 2   The patient's chronic medical conditions were treated accordingly per the patient's home medication regimen except as noted.  On day of discharge, patient was felt deemed stable for discharge. Patient/family member advised to call PCP or come back to ER if needed.   Principal Diagnosis: CAP (community acquired pneumonia)  Discharge Diagnoses: Active Hospital Problems   Diagnosis Date Noted   CAP (community acquired pneumonia) 08/06/2022    Priority: 1.   History of CVA (cerebrovascular accident) 03/11/2017   ESRD on hemodialysis ([facility]) 09/12/2014   Essential hypertension, benign 05/22/2014   DM (diabetes mellitus) ([facility]) 05/22/2014    Resolved Hospital Problems   Diagnosis Date Noted Date Resolved   Acute respiratory failure with hypoxia ([facility]) 08/06/2022 08/07/2022    Priority: 2.     Discharge Instructions     Increase activity slowly   Complete by: As directed    No wound care   Complete by: As directed       Allergies as of  08/07/2022       Reactions   Pollen Extract Other (See Comments)   Watery eyes        Medication List     TAKE these medications    amLODipine 10 MG tablet Commonly known as: NORVASC Take 10 mg by mouth in the morning.   amoxicillin 875 MG tablet Commonly known as: AMOXIL Take 1 tablet (875 mg total) by mouth 2 (two) times daily for 5 days.   aspirin EC 81 MG tablet Take 81 mg by mouth in the morning.   doxycycline 100 MG capsule Commonly known  as: VIBRAMYCIN Take 1 capsule (100 mg total) by mouth 2 (two) times daily for 5 days.   FLINTSTONES GUMMIES PO Take 2 tablets by mouth in the morning.   fluticasone 50 MCG/ACT nasal spray Commonly known as: FLONASE Place 1-2 sprays into both nostrils daily as needed for allergies.   Fosrenol 1000 MG Pack Generic drug: Lanthanum Carbonate Take 2,000 mg by mouth with breakfast, with lunch, and with evening meal.   labetalol 300 MG tablet Commonly known as: NORMODYNE Take 300 mg by mouth in the morning.   loratadine 10 MG tablet Commonly known as: CLARITIN Take 10 mg by mouth daily as needed for allergies.   NovoLOG 100 UNIT/ML injection Generic drug: insulin aspart Inject 150 Units into the skin See admin instructions. 150 units over 7 days   rosuvastatin 10 MG tablet Commonly known as: CRESTOR Take 10 mg by mouth at bedtime.        Allergies  Allergen Reactions   Pollen Extract Other (See Comments)    Watery eyes    Consultations: Nephrology  Procedures:   Discharge Exam: BP 132/68 (BP Location: Left Arm)   Pulse 87   Temp 97.7 F (36.5 C) (Oral)   Resp 17   Ht 5\' 3"  (1.6 m)   Wt 55 kg   SpO2 90%   BMI 21.48 kg/m  Physical Exam Constitutional:      Appearance: She is well-developed.  HENT:     Head: Normocephalic and atraumatic.     Mouth/Throat:     Mouth: Mucous membranes are moist.  Eyes:     Extraocular Movements: Extraocular movements intact.  Cardiovascular:     Rate and Rhythm: Normal rate and regular rhythm.  Pulmonary:     Effort: Pulmonary effort is normal.     Breath sounds: Normal breath sounds. No wheezing.  Abdominal:     General: Bowel sounds are normal. There is no distension.     Palpations: Abdomen is soft.     Tenderness: There is no abdominal tenderness.  Musculoskeletal:        General: No swelling. Normal range of motion.     Cervical back: Normal range of motion and neck supple.  Skin:    General: Skin is warm and  dry.  Neurological:     General: No focal deficit present.     Mental Status: She is alert.  Psychiatric:        Mood and Affect: Mood normal.      The results of significant diagnostics from this hospitalization (including imaging, microbiology, ancillary and laboratory) are listed below for reference.   Microbiology: Recent Results (from the past 240 hour(s))  Resp panel by RT-PCR (RSV, Flu A&B, Covid) Anterior Nasal Swab     Status: None   Collection Time: 08/06/22 11:50 AM   Specimen: Anterior Nasal Swab  Result Value Ref Range Status   SARS  Coronavirus 2 by RT PCR NEGATIVE NEGATIVE Final    Comment: (NOTE) SARS-CoV-2 target nucleic acids are NOT DETECTED.  The SARS-CoV-2 RNA is generally detectable in upper respiratory specimens during the acute phase of infection. The lowest concentration of SARS-CoV-2 viral copies this assay can detect is 138 copies/mL. A negative result does not preclude SARS-Cov-2 infection and should not be used as the sole basis for treatment or other patient management decisions. A negative result may occur with  improper specimen collection/handling, submission of specimen other than nasopharyngeal swab, presence of viral mutation(s) within the areas targeted by this assay, and inadequate number of viral copies(<138 copies/mL). A negative result must be combined with clinical observations, patient history, and epidemiological information. The expected result is Negative.  Fact Sheet for Patients:  EntrepreneurPulse.com.au  Fact Sheet for Healthcare Providers:  IncredibleEmployment.be  This test is no t yet approved or cleared by the [place] FDA and  has been authorized for detection and/or diagnosis of SARS-CoV-2 by FDA under an Emergency Use Authorization (EUA). This EUA will remain  in effect (meaning this test can be used) for the duration of the COVID-19 declaration under Section 564(b)(1) of the  Act, 21 U.S.C.section 360bbb-3(b)(1), unless the authorization is terminated  or revoked sooner.       Influenza A by PCR NEGATIVE NEGATIVE Final   Influenza B by PCR NEGATIVE NEGATIVE Final    Comment: (NOTE) The Xpert Xpress SARS-CoV-2/FLU/RSV plus assay is intended as an aid in the diagnosis of influenza from Nasopharyngeal swab specimens and should not be used as a sole basis for treatment. Nasal washings and aspirates are unacceptable for Xpert Xpress SARS-CoV-2/FLU/RSV testing.  Fact Sheet for Patients: EntrepreneurPulse.com.au  Fact Sheet for Healthcare Providers: IncredibleEmployment.be  This test is not yet approved or cleared by the [place] FDA and has been authorized for detection and/or diagnosis of SARS-CoV-2 by FDA under an Emergency Use Authorization (EUA). This EUA will remain in effect (meaning this test can be used) for the duration of the COVID-19 declaration under Section 564(b)(1) of the Act, 21 U.S.C. section 360bbb-3(b)(1), unless the authorization is terminated or revoked.     Resp Syncytial Virus by PCR NEGATIVE NEGATIVE Final    Comment: (NOTE) Fact Sheet for Patients: EntrepreneurPulse.com.au  Fact Sheet for Healthcare Providers: IncredibleEmployment.be  This test is not yet approved or cleared by the [place] FDA and has been authorized for detection and/or diagnosis of SARS-CoV-2 by FDA under an Emergency Use Authorization (EUA). This EUA will remain in effect (meaning this test can be used) for the duration of the COVID-19 declaration under Section 564(b)(1) of the Act, 21 U.S.C. section 360bbb-3(b)(1), unless the authorization is terminated or revoked.  Performed at [facility], [place] [address], [place], [place] [address]   MRSA Next Gen by PCR, Nasal     Status: None   Collection Time: 08/07/22  2:59 AM   Specimen: Nasal Mucosa; Nasal Swab  Result  Value Ref Range Status   MRSA by PCR Next Gen NOT DETECTED NOT DETECTED Final    Comment: (NOTE) The GeneXpert MRSA Assay (FDA approved for NASAL specimens only), is one component of a comprehensive MRSA colonization surveillance program. It is not intended to diagnose MRSA infection nor to guide or monitor treatment for MRSA infections. Test performance is not FDA approved in patients less than 46 years old. Performed at [facility], [place] [address] [place], [place] [address]      Labs:  BNP (last 3 results) Recent Labs    08/06/22 1154  BNP 4,270.6*   Basic Metabolic Panel: Recent Labs  Lab 08/06/22 1154 08/06/22 2025 08/07/22 0458  NA 136  --  134*  K 4.0  --  3.7  CL 92*  --  92*  CO2 28  --  22  GLUCOSE 101*  --  126*  BUN 28*  --  17  CREATININE 7.36*  --  4.67*  CALCIUM 8.5*  --  9.0  PHOS  --  7.1*  --    Liver Function Tests: Recent Labs  Lab 08/06/22 1154 08/07/22 0458  AST 720* 353*  ALT 338* 266*  ALKPHOS 49 48  BILITOT 0.9 1.4*  PROT 6.6 6.6  ALBUMIN 3.4* 3.2*   Recent Labs  Lab 08/06/22 1154  LIPASE 24   No results for input(s): "AMMONIA" in the last 168 hours. CBC: Recent Labs  Lab 08/06/22 1154 08/07/22 0458  WBC 7.8 8.7  NEUTROABS 6.3  --   HGB 10.1* 8.8*  HCT 32.5* 28.3*  MCV 88.3 87.9  PLT 66* 87*   Cardiac Enzymes: No results for input(s): "CKTOTAL", "CKMB", "CKMBINDEX", "TROPONINI" in the last 168 hours. BNP: Invalid input(s): "POCBNP" CBG: Recent Labs  Lab 08/06/22 1435 08/06/22 1547 08/07/22 0246 08/07/22 0952 08/07/22 1217  GLUCAP 24* 88 109* 169* 187*   D-Dimer No results for input(s): "DDIMER" in the last 72 hours. Hgb A1c No results for input(s): "HGBA1C" in the last 72 hours. Lipid Profile No results for input(s): "CHOL", "HDL", "LDLCALC", "TRIG", "CHOLHDL", "LDLDIRECT" in the last 72 hours. Thyroid function studies No results for input(s): "TSH", "T4TOTAL", "T3FREE", "THYROIDAB" in the last 72  hours.  Invalid input(s): "FREET3" Anemia work up No results for input(s): "VITAMINB12", "FOLATE", "FERRITIN", "TIBC", "IRON", "RETICCTPCT" in the last 72 hours. Urinalysis    Component Value Date/Time   COLORURINE YELLOW 03/29/2014 1734   APPEARANCEUR CLEAR 03/29/2014 1734   LABSPEC 1.015 03/29/2014 1734   PHURINE 8.0 03/29/2014 1734   GLUCOSEU >1000 (A) 03/29/2014 1734   HGBUR SMALL (A) 03/29/2014 1734   BILIRUBINUR NEGATIVE 03/29/2014 1734   KETONESUR NEGATIVE 03/29/2014 1734   PROTEINUR 100 (A) 03/29/2014 1734   UROBILINOGEN 0.2 03/29/2014 1734   NITRITE NEGATIVE 03/29/2014 1734   LEUKOCYTESUR TRACE (A) 03/29/2014 1734   Sepsis Labs Recent Labs  Lab 08/06/22 1154 08/07/22 0458  WBC 7.8 8.7   Microbiology Recent Results (from the past 240 hour(s))  Resp panel by RT-PCR (RSV, Flu A&B, Covid) Anterior Nasal Swab     Status: None   Collection Time: 08/06/22 11:50 AM   Specimen: Anterior Nasal Swab  Result Value Ref Range Status   SARS Coronavirus 2 by RT PCR NEGATIVE NEGATIVE Final    Comment: (NOTE) SARS-CoV-2 target nucleic acids are NOT DETECTED.  The SARS-CoV-2 RNA is generally detectable in upper respiratory specimens during the acute phase of infection. The lowest concentration of SARS-CoV-2 viral copies this assay can detect is 138 copies/mL. A negative result does not preclude SARS-Cov-2 infection and should not be used as the sole basis for treatment or other patient management decisions. A negative result may occur with  improper specimen collection/handling, submission of specimen other than nasopharyngeal swab, presence of viral mutation(s) within the areas targeted by this assay, and inadequate number of viral copies(<138 copies/mL). A negative result must be combined with clinical observations, patient history, and epidemiological information. The expected result is Negative.  Fact Sheet for Patients:  EntrepreneurPulse.com.au  Fact  Sheet for Healthcare Providers:  IncredibleEmployment.be  This test is no t yet approved or cleared by the [place] FDA and  has been authorized for detection and/or diagnosis of SARS-CoV-2 by FDA under an Emergency Use Authorization (EUA). This EUA will remain  in effect (meaning this test can be used) for the duration of the COVID-19 declaration under Section 564(b)(1) of the Act, 21 U.S.C.section 360bbb-3(b)(1), unless the authorization is terminated  or revoked sooner.       Influenza A by PCR NEGATIVE NEGATIVE Final   Influenza B by PCR NEGATIVE NEGATIVE Final    Comment: (NOTE) The Xpert Xpress SARS-CoV-2/FLU/RSV plus assay is intended as an aid in the diagnosis of influenza from Nasopharyngeal swab specimens and should not be used as a sole basis for treatment. Nasal washings and aspirates are unacceptable for Xpert Xpress SARS-CoV-2/FLU/RSV testing.  Fact Sheet for Patients: EntrepreneurPulse.com.au  Fact Sheet for Healthcare Providers: IncredibleEmployment.be  This test is not yet approved or cleared by the [place] FDA and has been authorized for detection and/or diagnosis of SARS-CoV-2 by FDA under an Emergency Use Authorization (EUA). This EUA will remain in effect (meaning this test can be used) for the duration of the COVID-19 declaration under Section 564(b)(1) of the Act, 21 U.S.C. section 360bbb-3(b)(1), unless the authorization is terminated or revoked.     Resp Syncytial Virus by PCR NEGATIVE NEGATIVE Final    Comment: (NOTE) Fact Sheet for Patients: EntrepreneurPulse.com.au  Fact Sheet for Healthcare Providers: IncredibleEmployment.be  This test is not yet approved or cleared by the [place] FDA and has been authorized for detection and/or diagnosis of SARS-CoV-2 by FDA under an Emergency Use Authorization (EUA). This EUA will remain in effect  (meaning this test can be used) for the duration of the COVID-19 declaration under Section 564(b)(1) of the Act, 21 U.S.C. section 360bbb-3(b)(1), unless the authorization is terminated or revoked.  Performed at [facility], [place] [address] [place], [place] [address]   MRSA Next Gen by PCR, Nasal     Status: None   Collection Time: 08/07/22  2:59 AM   Specimen: Nasal Mucosa; Nasal Swab  Result Value Ref Range Status   MRSA by PCR Next Gen NOT DETECTED NOT DETECTED Final    Comment: (NOTE) The GeneXpert MRSA Assay (FDA approved for NASAL specimens only), is one component of a comprehensive MRSA colonization surveillance program. It is not intended to diagnose MRSA infection nor to guide or monitor treatment for MRSA infections. Test performance is not FDA approved in patients less than 17 years old. Performed at [facility], [place] [address] [place], [place] [address]     Procedures/Studies: DG Chest 2 View  Result Date: 08/06/2022 CLINICAL DATA:  Shortness of breath EXAM: CHEST - 2 VIEW COMPARISON:  Chest x-ray March 08, 2021 FINDINGS: Unchanged cardiomegaly. Stable mediastinal contours. Confluent opacity in the right middle lobe. Moderate right pleural effusion. No focal opacity in the left lung. No large pneumothorax. The visualized upper abdomen is unremarkable. No acute osseous abnormality. IMPRESSION: 1. Right middle lobe confluent opacity, concerning for pneumonia. Recommend treatment of acute illness and repeat chest radiograph in 6-8 weeks to ensure resolution and exclude underlying malignancy. 2. Moderate right pleural effusion. Electronically Signed   By: Beryle Flock M.D.   On: 08/06/2022 13:22     Time coordinating discharge: Over 52 minutes    Dwyane Dee, MD  [facility] 08/07/2022, 2:10 PM

## 2022-08-07 NOTE — Progress Notes (Signed)
Inpatient Diabetes Program Recommendations  AACE/ADA: New Consensus Statement on Inpatient Glycemic Control (2015)  Target Ranges:  Prepandial:   less than 140 mg/dL      Peak postprandial:   less than 180 mg/dL (1-2 hours)      Critically ill patients:  140 - 180 mg/dL   Lab Results  Component Value Date   GLUCAP 187 (H) 08/07/2022   HGBA1C 7.5 (H) 05/31/2020    Review of Glycemic Control  Latest Reference Range & Units 08/07/22 02:46 08/07/22 09:52 08/07/22 12:17  Glucose-Capillary 70 - 99 mg/dL 109 (H) 169 (H) 187 (H)   Diabetes history: DM 1 Outpatient Diabetes medications:  T-Slim insulin pump Basal rate:  0.25 units/ hr 1 unit drops blood sugar 80 mg/dL 1 unit for every 26 grams of CHO Current orders for Inpatient glycemic control:  Insulin pump  Inpatient Diabetes Program Recommendations:    Spoke with patient regarding insulin pump settings and CGM.  She states that Dexcom CGM has been ordered and she is not wearing it at this time.  I encouraged patient to put Dexcom back on ASAP in order to be alerted of low blood sugars and so that insulin pump will reduce rates when blood sugars dropping.  Patient verbalized understanding.  Note plan for patient to d/c today.  Patient see's endocrinologist at [facility].  Will follow.   Thanks,  Adah Perl, RN, BC-ADM Inpatient Diabetes Coordinator Pager [phone]  (8a-5p)

## 2022-08-08 ENCOUNTER — Observation Stay (HOSPITAL_COMMUNITY): Payer: Medicare HMO

## 2022-08-08 DIAGNOSIS — I10 Essential (primary) hypertension: Secondary | ICD-10-CM | POA: Diagnosis not present

## 2022-08-08 DIAGNOSIS — B9561 Methicillin susceptible Staphylococcus aureus infection as the cause of diseases classified elsewhere: Secondary | ICD-10-CM

## 2022-08-08 DIAGNOSIS — J9601 Acute respiratory failure with hypoxia: Secondary | ICD-10-CM | POA: Diagnosis not present

## 2022-08-08 DIAGNOSIS — T827XXA Infection and inflammatory reaction due to other cardiac and vascular devices, implants and grafts, initial encounter: Secondary | ICD-10-CM | POA: Diagnosis present

## 2022-08-08 DIAGNOSIS — J1520 Pneumonia due to staphylococcus, unspecified: Secondary | ICD-10-CM | POA: Diagnosis not present

## 2022-08-08 DIAGNOSIS — I088 Other rheumatic multiple valve diseases: Secondary | ICD-10-CM | POA: Diagnosis not present

## 2022-08-08 DIAGNOSIS — E1051 Type 1 diabetes mellitus with diabetic peripheral angiopathy without gangrene: Secondary | ICD-10-CM | POA: Diagnosis not present

## 2022-08-08 DIAGNOSIS — E10319 Type 1 diabetes mellitus with unspecified diabetic retinopathy without macular edema: Secondary | ICD-10-CM | POA: Diagnosis present

## 2022-08-08 DIAGNOSIS — E10649 Type 1 diabetes mellitus with hypoglycemia without coma: Secondary | ICD-10-CM | POA: Diagnosis present

## 2022-08-08 DIAGNOSIS — Z79899 Other long term (current) drug therapy: Secondary | ICD-10-CM | POA: Diagnosis not present

## 2022-08-08 DIAGNOSIS — I509 Heart failure, unspecified: Secondary | ICD-10-CM | POA: Diagnosis not present

## 2022-08-08 DIAGNOSIS — Y832 Surgical operation with anastomosis, bypass or graft as the cause of abnormal reaction of the patient, or of later complication, without mention of misadventure at the time of the procedure: Secondary | ICD-10-CM | POA: Diagnosis present

## 2022-08-08 DIAGNOSIS — Z9641 Presence of insulin pump (external) (internal): Secondary | ICD-10-CM | POA: Diagnosis present

## 2022-08-08 DIAGNOSIS — I120 Hypertensive chronic kidney disease with stage 5 chronic kidney disease or end stage renal disease: Secondary | ICD-10-CM | POA: Diagnosis not present

## 2022-08-08 DIAGNOSIS — N186 End stage renal disease: Secondary | ICD-10-CM | POA: Diagnosis present

## 2022-08-08 DIAGNOSIS — T82898A Other specified complication of vascular prosthetic devices, implants and grafts, initial encounter: Secondary | ICD-10-CM | POA: Diagnosis not present

## 2022-08-08 DIAGNOSIS — D631 Anemia in chronic kidney disease: Secondary | ICD-10-CM | POA: Diagnosis present

## 2022-08-08 DIAGNOSIS — R7881 Bacteremia: Secondary | ICD-10-CM

## 2022-08-08 DIAGNOSIS — Z8673 Personal history of transient ischemic attack (TIA), and cerebral infarction without residual deficits: Secondary | ICD-10-CM | POA: Diagnosis not present

## 2022-08-08 DIAGNOSIS — K029 Dental caries, unspecified: Secondary | ICD-10-CM | POA: Diagnosis not present

## 2022-08-08 DIAGNOSIS — R7401 Elevation of levels of liver transaminase levels: Secondary | ICD-10-CM | POA: Diagnosis present

## 2022-08-08 DIAGNOSIS — E785 Hyperlipidemia, unspecified: Secondary | ICD-10-CM | POA: Diagnosis present

## 2022-08-08 DIAGNOSIS — R6884 Jaw pain: Secondary | ICD-10-CM | POA: Diagnosis not present

## 2022-08-08 DIAGNOSIS — Z992 Dependence on renal dialysis: Secondary | ICD-10-CM | POA: Diagnosis not present

## 2022-08-08 DIAGNOSIS — Z794 Long term (current) use of insulin: Secondary | ICD-10-CM | POA: Diagnosis not present

## 2022-08-08 DIAGNOSIS — I5032 Chronic diastolic (congestive) heart failure: Secondary | ICD-10-CM | POA: Diagnosis present

## 2022-08-08 DIAGNOSIS — D6959 Other secondary thrombocytopenia: Secondary | ICD-10-CM | POA: Diagnosis present

## 2022-08-08 DIAGNOSIS — N2581 Secondary hyperparathyroidism of renal origin: Secondary | ICD-10-CM | POA: Diagnosis present

## 2022-08-08 DIAGNOSIS — J189 Pneumonia, unspecified organism: Secondary | ICD-10-CM | POA: Diagnosis present

## 2022-08-08 DIAGNOSIS — R0602 Shortness of breath: Secondary | ICD-10-CM | POA: Diagnosis not present

## 2022-08-08 DIAGNOSIS — M1990 Unspecified osteoarthritis, unspecified site: Secondary | ICD-10-CM | POA: Diagnosis present

## 2022-08-08 DIAGNOSIS — Z1152 Encounter for screening for COVID-19: Secondary | ICD-10-CM | POA: Diagnosis not present

## 2022-08-08 DIAGNOSIS — N185 Chronic kidney disease, stage 5: Secondary | ICD-10-CM | POA: Diagnosis not present

## 2022-08-08 DIAGNOSIS — I361 Nonrheumatic tricuspid (valve) insufficiency: Secondary | ICD-10-CM | POA: Diagnosis not present

## 2022-08-08 DIAGNOSIS — I132 Hypertensive heart and chronic kidney disease with heart failure and with stage 5 chronic kidney disease, or end stage renal disease: Secondary | ICD-10-CM | POA: Diagnosis not present

## 2022-08-08 DIAGNOSIS — E1059 Type 1 diabetes mellitus with other circulatory complications: Secondary | ICD-10-CM | POA: Diagnosis not present

## 2022-08-08 DIAGNOSIS — M25571 Pain in right ankle and joints of right foot: Secondary | ICD-10-CM | POA: Diagnosis present

## 2022-08-08 DIAGNOSIS — E1022 Type 1 diabetes mellitus with diabetic chronic kidney disease: Secondary | ICD-10-CM | POA: Diagnosis present

## 2022-08-08 LAB — GLUCOSE, CAPILLARY
Glucose-Capillary: 176 mg/dL — ABNORMAL HIGH (ref 70–99)
Glucose-Capillary: 131 mg/dL — ABNORMAL HIGH (ref 70–99)
Glucose-Capillary: 99 mg/dL (ref 70–99)
Glucose-Capillary: 132 mg/dL — ABNORMAL HIGH (ref 70–99)
Glucose-Capillary: 120 mg/dL — ABNORMAL HIGH (ref 70–99)

## 2022-08-08 LAB — COMPREHENSIVE METABOLIC PANEL
ALT: 152 U/L — ABNORMAL HIGH (ref 0–44)
AST: 160 U/L — ABNORMAL HIGH (ref 15–41)
Albumin: 3 g/dL — ABNORMAL LOW (ref 3.5–5.0)
Alkaline Phosphatase: 53 U/L (ref 38–126)
Anion gap: 18 — ABNORMAL HIGH (ref 5–15)
BUN: 39 mg/dL — ABNORMAL HIGH (ref 6–20)
CO2: 25 mmol/L (ref 22–32)
Calcium: 8.9 mg/dL (ref 8.9–10.3)
Chloride: 91 mmol/L — ABNORMAL LOW (ref 98–111)
Creatinine, Ser: 6.94 mg/dL — ABNORMAL HIGH (ref 0.44–1.00)
GFR, Estimated: 7 mL/min — ABNORMAL LOW (ref >60)
Glucose, Bld: 154 mg/dL — ABNORMAL HIGH (ref 70–99)
Potassium: 3.7 mmol/L (ref 3.5–5.1)
Sodium: 134 mmol/L — ABNORMAL LOW (ref 135–145)
Total Bilirubin: 1 mg/dL (ref 0.3–1.2)
Total Protein: 6.3 g/dL — ABNORMAL LOW (ref 6.5–8.1)

## 2022-08-08 LAB — CBC WITH DIFFERENTIAL/PLATELET
Abs Immature Granulocytes: 0.04 10*3/uL (ref 0.00–0.07)
Basophils Absolute: 0 10*3/uL (ref 0.0–0.1)
Basophils Relative: 0 %
Eosinophils Absolute: 0.1 10*3/uL (ref 0.0–0.5)
Eosinophils Relative: 1 %
HCT: 27.9 % — ABNORMAL LOW (ref 36.0–46.0)
Hemoglobin: 9.2 g/dL — ABNORMAL LOW (ref 12.0–15.0)
Immature Granulocytes: 1 %
Lymphocytes Relative: 12 %
Lymphs Abs: 0.8 10*3/uL (ref 0.7–4.0)
MCH: 27.8 pg (ref 26.0–34.0)
MCHC: 33 g/dL (ref 30.0–36.0)
MCV: 84.3 fL (ref 80.0–100.0)
Monocytes Absolute: 0.5 10*3/uL (ref 0.1–1.0)
Monocytes Relative: 8 %
Neutro Abs: 5.3 10*3/uL (ref 1.7–7.7)
Neutrophils Relative %: 78 %
Platelets: 104 10*3/uL — ABNORMAL LOW (ref 150–400)
RBC: 3.31 MIL/uL — ABNORMAL LOW (ref 3.87–5.11)
RDW: 17.9 % — ABNORMAL HIGH (ref 11.5–15.5)
WBC: 6.8 10*3/uL (ref 4.0–10.5)
nRBC: 0 % (ref 0.0–0.2)

## 2022-08-08 LAB — ECHOCARDIOGRAM COMPLETE
Area-P 1/2: 4.17 cm2
Calc EF: 55.7 %
Height: 63 in
S' Lateral: 3 cm
Single Plane A2C EF: 53.3 %
Single Plane A4C EF: 56 %
Weight: 1940.05 oz

## 2022-08-08 LAB — MAGNESIUM: Magnesium: 2.3 mg/dL (ref 1.7–2.4)

## 2022-08-08 NOTE — Consult Note (Addendum)
[facility]                                                                                        Patient Identification: Patient Name: [name] MRN: [id number] [place] Date: 08/06/2022 11:20 AM Today's Date: 08/08/2022 Reason for consult: bacteremia  Requesting provider: CHAMP autoconsult   Principal Problem:   CAP (community acquired pneumonia) Active Problems:   Essential hypertension, benign   DM (diabetes mellitus) ([place])   ESRD on hemodialysis ([place])   History of CVA (cerebrovascular accident)   Gram-positive bacteremia   Antibiotics:  Doxycycline 1/17, Azithromycin 1/18-c Ceftriaxone 1/17- c Cefazolin 1/18, Vancomycin 1/18-c  Lines/Hardware: Rt UE AV graft , Left AV graft ( non working)  Assessment 47 year old female with PMH as below including ESRD on HD via rt  upper extremity AV graft, type I DM, HTN, streptococcus vestibularis bacteremia in 01/2020( treated with 2 weeks of IV cefazolin)  to the ED on 1/17 with shortness of breath/?cough . Admitted with:   # Staph aureus bacteremia # Acute hypoxic respiratory failure 2/2 pna, likely staph aureus related, pleural effusion # Transaminitis - in the setting of sepsis, improving, 1/17 acute hepatitis panel negative # Thrombocytopenia - in the setting of sepsis, improving  Recommendations  Continue Vancomycin, with HD DC ceftriaxone and azithromycin, unlikely to be CAP/another bacterial process  FU repeat blood cx from 1/18 Fu sensi of staph aureus from HD center, d/w Nephrology PA Ria Comment [facility] TTE has been ordered but will need TEE if unremarkable TTE to exclude endocarditis  Monitor for metastatic sites of infection  Monitor CBC, BMP and Vancomycin trough Following peripherally over the weekend, please call with questions  Rest of the management as per the primary team. Please call with questions or concerns.   Thank you for the consult  Rosiland Oz, MD Infectious Disease Physician [facility] [address] [place], [place] [address] Phone: [phone]  Fax: [phone]  __________________________________________________________________________________________________________ HPI and Hospital Course: 47 year old female with PMH as below including ESRD on HD via rt  upper extremity AV graft, type I DM, HTN, HLD, streptococcus vestibularis bacteremia in 01/2020( treated with 2 weeks of IV cefazolin)  to the ED on 1/17 with shortness of breath/?cough  She also had some diarrhea when she went to dialysis the day before.    At ED febrile, SpO2 at 78% room air, BG 27 Labs remarkable for no leukocytosis, thrombocytopenia, elevated BNP and liver enzymes Chest x-ray with right middle lobe pneumonia  Per patient, started having SOB since Tuesday but denies cough, she had chills while at the HD and was told to be tested for covid. Blood cx taken at the HD center reportedly is positive for GPC in clusters, not identified yet. She also had watery diarrhea since Saturday which has resolved by now. Complains of pain at the rt ankle with pain  radiating upwards but no warmth, swelling and tenderness. Denies pain or swelling at other joints. Denies back pain. Denies any concerns at the rt arm graft  ROS: General- Denies fevers, loss of appetite and loss  of weight, chills + HEENT - Denies headache, blurry vision, neck pain, sinus pain Chest - Denies any chest pain, cough, SOB + CVS- Denies any dizziness/lightheadedness, syncopal attacks, palpitations Abdomen- Denies any nausea, vomiting, abdominal pain, hematochezia, diarrhea has resolved  Neuro - Denies any weakness, numbness, tingling sensation Psych - Denies any changes in mood irritability or depressive symptoms GU- Denies any burning, dysuria, hematuria or increased frequency of urination Skin -  denies any rashes/lesions MSK - denies any joint pain/swelling or restricted ROM   Past Medical History:  Diagnosis Date   Anemia    Arthritis    Ascites 03/28/2020   Bacteremia 02/11/2020   Cerebral infarction due to unspecified mechanism 05/22/2014   CHF (congestive heart failure) (HCC)    Diabetes mellitus    Diabetic retinopathy ([place])    Hx of laser Rx's   DM type 1 (diabetes mellitus, type 1) ([place]) 09/12/2014   Age of onset for DM type 1 was age 39.      Enlarged thyroid gland    ESRD on hemodialysis ([place])    ESRD due to DM type I, age of onset DM 1 was age 64.  Went on dialysis in March 2012.  Gets HD now at [facility] on a TTS schedule.     ESRD on hemodialysis ([place]) 09/12/2014   ESRD due to DM type 1.  Started HD in 2012 at [facility].  Gets HD there on a TTS schedule.     GERD (gastroesophageal reflux disease)    HCAP (healthcare-associated pneumonia) 09/11/2014   Hemorrhage from arteriovenous dialysis graft ([place]) 10/20/2019   History of blood transfusion    Hyperlipidemia    Hypertension    Hypertensive emergency 05/22/2014   Peripheral vascular disease ([place])    Pneumonia    Past Surgical History:  Procedure Laterality Date   A/V FISTULAGRAM Right 10/31/2020   Procedure: A/V FISTULAGRAM;  Surgeon: Cherre Robins, MD;  Location: [place] [facility];  Service: Cardiovascular;  Laterality: Right;   ARTERIOVENOUS GRAFT PLACEMENT     ARTERY REPAIR Left 12/10/2012   Procedure: BRACHIAL ARTERY REPAIR;  Surgeon: Angelia Mould, MD;  Location: [place];  Service: Vascular;  Laterality: Left;  Exploration Left Brachial Artery for AVF   AV FISTULA PLACEMENT Right 01/11/2018   Procedure: INSERTION OF ARTERIOVENOUS (AV) GORE-TEX GRAFT  UPPER ARM;  Surgeon: Angelia Mould, MD;  Location: [place];  Service: Vascular;  Laterality: Right;   [place] Right 09/28/2017   Procedure: FIRST STAGE BASILIC VEIN TRANSPOSITION;  Surgeon: Angelia Mould, MD;   Location: [place];  Service: Vascular;  Laterality: Right;   CESAREAN SECTION     CHEST TUBE INSERTION Right 06/01/2020   Procedure: CHEST TUBE INSERTION;  Surgeon: Garner Nash, DO;  Location: [place];  Service: Pulmonary;  Laterality: Right;   CHEST TUBE INSERTION Right 07/04/2020   Procedure: INSERTION PLEURAL DRAINAGE CATHETER;  Surgeon: Melrose Nakayama, MD;  Location: [place];  Service: Thoracic;  Laterality: Right;   EYE SURGERY     [place]   EYE SURGERY Left    IR THORACENTESIS ASP PLEURAL SPACE W/IMG GUIDE  05/31/2020   IR THORACENTESIS ASP PLEURAL SPACE W/IMG GUIDE  06/22/2020   REVISION OF ARTERIOVENOUS GORETEX GRAFT Left 08/01/2016   Procedure: REVISION OF ARTERIOVENOUS GORETEX GRAFT;  Surgeon: Angelia Mould, MD;  Location: [place];  Service: Vascular;  Laterality: Left;   REVISION OF ARTERIOVENOUS GORETEX GRAFT Right 10/21/2019   Procedure:  REVISION OF ARTERIOVENOUS GORETEX GRAFT ARM;  Surgeon: Rosetta Posner, MD;  Location: [place];  Service: Vascular;  Laterality: Right;   RIGHT HEART CATH N/A 08/01/2020   Procedure: RIGHT HEART CATH;  Surgeon: Jolaine Artist, MD;  Location: [place] [facility];  Service: Cardiovascular;  Laterality: N/A;   SHUNTOGRAM Left 11/30/2013   Procedure: Earney Mallet;  Surgeon: Serafina Mitchell, MD;  Location: [facility] C[facility];  Service: Cardiovascular;  Laterality: Left;   TEE WITHOUT CARDIOVERSION N/A 02/13/2020   Procedure: TRANSESOPHAGEAL ECHOCARDIOGRAM (TEE);  Surgeon: Josue Hector, MD;  Location: [facility] ENDOSCOPY;  Service: Cardiovascular;  Laterality: N/A;    Scheduled Meds:  acetaminophen  1,000 mg Oral Q6H   amLODipine  10 mg Oral Daily   aspirin EC  81 mg Oral Daily   Chlorhexidine Gluconate Cloth  6 each Topical Q0600   heparin  2,000 Units Dialysis Once in dialysis   insulin pump   Subcutaneous TID WC, HS, 0200   labetalol  300 mg Oral Daily   sodium chloride flush  3 mL Intravenous Q12H   Continuous Infusions:  anticoagulant  sodium citrate     azithromycin 250 mg (08/08/22 1206)   cefTRIAXone (ROCEPHIN)  IV 2 g (08/08/22 1018)   [START ON 08/09/2022] vancomycin     PRN Meds:.alteplase, anticoagulant sodium citrate, heparin, lidocaine (PF), lidocaine-prilocaine, pentafluoroprop-tetrafluoroeth, polyethylene glycol  Allergies  Allergen Reactions   Pollen Extract Other (See Comments)    Watery eyes   Social History   Socioeconomic History   Marital status: Single    Spouse name: Not on file   Number of children: 1   Years of education: [employer]   Highest education level: Not on file  Occupational History   Not on file  Tobacco Use   Smoking status: Never   Smokeless tobacco: Never  Vaping Use   Vaping Use: Never used  Substance and Sexual Activity   Alcohol use: No    Alcohol/week: 0.0 standard drinks of alcohol   Drug use: No   Sexual activity: Not Currently  Other Topics Concern   Not on file  Social History Narrative   ** Merged History Encounter **       Patient is single with 1 child. Patient is right handed. Patient has BS degree. Patient drinks 0 caffeine.   Social Determinants of Health   Financial Resource Strain: Not on file  Food Insecurity: No Food Insecurity (08/07/2022)   Hunger Vital Sign    Worried About Running Out of Food in the Last Year: Never true    Ran Out of Food in the Last Year: Never true  Transportation Needs: No Transportation Needs (08/07/2022)   PRAPARE - Hydrologist (Medical): No    Lack of Transportation (Non-Medical): No  Physical Activity: Not on file  Stress: Not on file  Social Connections: Not on file  Intimate Partner Violence: Not At Risk (08/07/2022)   Humiliation, Afraid, Rape, and Kick questionnaire    Fear of Current or Ex-Partner: No    Emotionally Abused: No    Physically Abused: No    Sexually Abused: No   Family History  Problem Relation Age of Onset   Cancer Mother        breast   Hypertension Father     Vitals BP 107/61 (BP Location: Left Arm)   Pulse 78   Temp 98 F (36.7 C) (Oral)   Resp 18   Ht 5\' 3"  (1.6 m)  Wt 55 kg   SpO2 93%   BMI 21.48 kg/m   Physical Exam Constitutional:  black female sitting up in the chair and appears comfortable     Comments:   Cardiovascular:     Rate and Rhythm: Normal rate and regular rhythm.     Heart sounds: R1V4, systolic murmur    Pulmonary:     Effort: Pulmonary effort is normal on nasal cannula     Comments: crepts at the rt lower base  Abdominal:     Palpations: Abdomen is soft.     Tenderness: non distended and non tender  Musculoskeletal:        General: No swelling or tenderness in peripheral joints inlcuding rt ankle, no spinal tenderness,   Skin:    Comments: No erythema, swelling and tenderness at the rt arm graft   Neurological:     General: awake, alert and oriented   Psychiatric:        Mood and Affect: Mood normal.    Pertinent Microbiology Results for orders placed or performed during the hospital encounter of 08/06/22  Resp panel by RT-PCR (RSV, Flu A&B, Covid) Anterior Nasal Swab     Status: None   Collection Time: 08/06/22 11:50 AM   Specimen: Anterior Nasal Swab  Result Value Ref Range Status   SARS Coronavirus 2 by RT PCR NEGATIVE NEGATIVE Final    Comment: (NOTE) SARS-CoV-2 target nucleic acids are NOT DETECTED.  The SARS-CoV-2 RNA is generally detectable in upper respiratory specimens during the acute phase of infection. The lowest concentration of SARS-CoV-2 viral copies this assay can detect is 138 copies/mL. A negative result does not preclude SARS-Cov-2 infection and should not be used as the sole basis for treatment or other patient management decisions. A negative result may occur with  improper specimen collection/handling, submission of specimen other than nasopharyngeal swab, presence of viral mutation(s) within the areas targeted by this assay, and inadequate number of  viral copies(<138 copies/mL). A negative result must be combined with clinical observations, patient history, and epidemiological information. The expected result is Negative.  Fact Sheet for Patients:  EntrepreneurPulse.com.au  Fact Sheet for Healthcare Providers:  IncredibleEmployment.be  This test is no t yet approved or cleared by the [place] FDA and  has been authorized for detection and/or diagnosis of SARS-CoV-2 by FDA under an Emergency Use Authorization (EUA). This EUA will remain  in effect (meaning this test can be used) for the duration of the COVID-19 declaration under Section 564(b)(1) of the Act, 21 U.S.C.section 360bbb-3(b)(1), unless the authorization is terminated  or revoked sooner.       Influenza A by PCR NEGATIVE NEGATIVE Final   Influenza B by PCR NEGATIVE NEGATIVE Final    Comment: (NOTE) The Xpert Xpress SARS-CoV-2/FLU/RSV plus assay is intended as an aid in the diagnosis of influenza from Nasopharyngeal swab specimens and should not be used as a sole basis for treatment. Nasal washings and aspirates are unacceptable for Xpert Xpress SARS-CoV-2/FLU/RSV testing.  Fact Sheet for Patients: EntrepreneurPulse.com.au  Fact Sheet for Healthcare Providers: IncredibleEmployment.be  This test is not yet approved or cleared by the [place] FDA and has been authorized for detection and/or diagnosis of SARS-CoV-2 by FDA under an Emergency Use Authorization (EUA). This EUA will remain in effect (meaning this test can be used) for the duration of the COVID-19 declaration under Section 564(b)(1) of the Act, 21 U.S.C. section 360bbb-3(b)(1), unless the authorization is terminated or revoked.     Resp  Syncytial Virus by PCR NEGATIVE NEGATIVE Final    Comment: (NOTE) Fact Sheet for Patients: EntrepreneurPulse.com.au  Fact Sheet for Healthcare  Providers: IncredibleEmployment.be  This test is not yet approved or cleared by the [place] FDA and has been authorized for detection and/or diagnosis of SARS-CoV-2 by FDA under an Emergency Use Authorization (EUA). This EUA will remain in effect (meaning this test can be used) for the duration of the COVID-19 declaration under Section 564(b)(1) of the Act, 21 U.S.C. section 360bbb-3(b)(1), unless the authorization is terminated or revoked.  Performed at [facility], [place] [address], [place], [place] [address]   Respiratory (~20 pathogens) panel by PCR     Status: None   Collection Time: 08/06/22 11:50 AM   Specimen: Nasopharyngeal Swab; Respiratory  Result Value Ref Range Status   Adenovirus NOT DETECTED NOT DETECTED Final   Coronavirus 229E NOT DETECTED NOT DETECTED Final    Comment: (NOTE) The Coronavirus on the Respiratory Panel, DOES NOT test for the novel  Coronavirus (2019 nCoV)    Coronavirus HKU1 NOT DETECTED NOT DETECTED Final   Coronavirus NL63 NOT DETECTED NOT DETECTED Final   Coronavirus OC43 NOT DETECTED NOT DETECTED Final   Metapneumovirus NOT DETECTED NOT DETECTED Final   Rhinovirus / Enterovirus NOT DETECTED NOT DETECTED Final   Influenza A NOT DETECTED NOT DETECTED Final   Influenza B NOT DETECTED NOT DETECTED Final   Parainfluenza Virus 1 NOT DETECTED NOT DETECTED Final   Parainfluenza Virus 2 NOT DETECTED NOT DETECTED Final   Parainfluenza Virus 3 NOT DETECTED NOT DETECTED Final   Parainfluenza Virus 4 NOT DETECTED NOT DETECTED Final   Respiratory Syncytial Virus NOT DETECTED NOT DETECTED Final   Bordetella pertussis NOT DETECTED NOT DETECTED Final   Bordetella Parapertussis NOT DETECTED NOT DETECTED Final   Chlamydophila pneumoniae NOT DETECTED NOT DETECTED Final   Mycoplasma pneumoniae NOT DETECTED NOT DETECTED Final    Comment: Performed at [facility], [place]. [address], [place], [place] [address]  MRSA Next Gen by  PCR, Nasal     Status: None   Collection Time: 08/07/22  2:59 AM   Specimen: Nasal Mucosa; Nasal Swab  Result Value Ref Range Status   MRSA by PCR Next Gen NOT DETECTED NOT DETECTED Final    Comment: (NOTE) The GeneXpert MRSA Assay (FDA approved for NASAL specimens only), is one component of a comprehensive MRSA colonization surveillance program. It is not intended to diagnose MRSA infection nor to guide or monitor treatment for MRSA infections. Test performance is not FDA approved in patients less than 86 years old. Performed at [facility], [place] [address], [place], [place] [address]   Culture, blood (Routine X 2) w Reflex to ID Panel     Status: None (Preliminary result)   Collection Time: 08/07/22  4:17 PM   Specimen: BLOOD LEFT FOREARM  Result Value Ref Range Status   Specimen Description BLOOD LEFT FOREARM  Final   Special Requests   Final    BOTTLES DRAWN AEROBIC ONLY Blood Culture adequate volume   Culture   Final    NO GROWTH < 24 HOURS Performed at [facility], [place] [address], [place],  [address]    Report Status PENDING  Incomplete  Culture, blood (Routine X 2) w Reflex to ID Panel     Status: None (Preliminary result)   Collection Time: 08/07/22  4:18 PM   Specimen: BLOOD LEFT HAND  Result Value Ref Range  Status   Specimen Description BLOOD LEFT HAND  Final   Special Requests   Final    BOTTLES DRAWN AEROBIC AND ANAEROBIC Blood Culture adequate volume   Culture   Final    NO GROWTH < 24 HOURS Performed at [facility], [address], [place], [place] [address]    Report Status PENDING  Incomplete   Pertinent Lab seen by me:    Latest Ref Rng & Units 08/08/2022    3:39 AM 08/07/2022    4:58 AM 08/06/2022   11:54 AM  CBC  WBC 4.0 - 10.5 K/uL 6.8  8.7  7.8   Hemoglobin 12.0 - 15.0 g/dL 9.2  8.8  10.1   Hematocrit 36.0 - 46.0 % 27.9  28.3  32.5   Platelets 150 - 400 K/uL 104  87  66       Latest Ref Rng & Units 08/08/2022    3:39 AM  08/07/2022    4:58 AM 08/06/2022   11:54 AM  CMP  Glucose 70 - 99 mg/dL 154  126  101   BUN 6 - 20 mg/dL 39  17  28   Creatinine 0.44 - 1.00 mg/dL 6.94  4.67  7.36   Sodium 135 - 145 mmol/L 134  134  136   Potassium 3.5 - 5.1 mmol/L 3.7  3.7  4.0   Chloride 98 - 111 mmol/L 91  92  92   CO2 22 - 32 mmol/L 25  22  28    Calcium 8.9 - 10.3 mg/dL 8.9  9.0  8.5   Total Protein 6.5 - 8.1 g/dL 6.3  6.6  6.6   Total Bilirubin 0.3 - 1.2 mg/dL 1.0  1.4  0.9   Alkaline Phos 38 - 126 U/L 53  48  49   AST 15 - 41 U/L 160  353  720   ALT 0 - 44 U/L 152  266  338      Pertinent Imagings/Other Imagings Plain films and CT images have been personally visualized and interpreted; radiology reports have been reviewed. Decision making incorporated into the Impression / Recommendations.  CT ANKLE RIGHT W CONTRAST  Result Date: 08/07/2022 CLINICAL DATA:  Bacteremia, right ankle pain EXAM: CT OF THE RIGHT ANKLE WITH CONTRAST TECHNIQUE: Multidetector CT imaging of the right ankle was performed following the standard protocol during bolus administration of intravenous contrast. RADIATION DOSE REDUCTION: This exam was performed according to the departmental dose-optimization program which includes automated exposure control, adjustment of the mA and/or kV according to patient size and/or use of iterative reconstruction technique. CONTRAST:  68mL OMNIPAQUE IOHEXOL 350 MG/ML SOLN COMPARISON:  None Available. FINDINGS: Bones/Joint/Cartilage No fracture or acute bony findings. There is some degenerative loss of articular space at the calcaneocuboid articulation. No tibiotalar joint effusion or substantial effusion of the distal tibiofibular articulation. No effacement of the adipose tissue in the sinus tarsi. No subtalar joint effusion is appreciated. Ligaments Suboptimally assessed by CT. Muscles and Tendons No significant tenosynovitis observed. Soft tissues Atherosclerosis noted.  Mild subcutaneous edema along the heel.  IMPRESSION: 1. Mild degenerative loss of articular space at the calcaneocuboid articulation. 2. Mild subcutaneous edema along the heel. 3. Atherosclerosis. Electronically Signed   By: Van Clines M.D.   On: 08/07/2022 21:36   DG Chest 2 View  Result Date: 08/06/2022 CLINICAL DATA:  Shortness of breath EXAM: CHEST - 2 VIEW COMPARISON:  Chest x-ray March 08, 2021 FINDINGS: Unchanged cardiomegaly. Stable mediastinal contours. Confluent opacity  in the right middle lobe. Moderate right pleural effusion. No focal opacity in the left lung. No large pneumothorax. The visualized upper abdomen is unremarkable. No acute osseous abnormality. IMPRESSION: 1. Right middle lobe confluent opacity, concerning for pneumonia. Recommend treatment of acute illness and repeat chest radiograph in 6-8 weeks to ensure resolution and exclude underlying malignancy. 2. Moderate right pleural effusion. Electronically Signed   By: Beryle Flock M.D.   On: 08/06/2022 13:22     I spent 100  minutes for this patient encounter including review of prior medical records/discussing diagnostics and treatment plan with the patient/family/coordinate care with primary/other specialits with greater than 50% of time in face to face encounter.   Electronically signed by:   Rosiland Oz, MD Infectious Disease Physician [facility] Pager: [phone]

## 2022-08-08 NOTE — Progress Notes (Signed)
Had patient sign, Patient Agreement for Insulin Pump Therapy form because patient is using and administering insulin during her stay. Form placed in chart.

## 2022-08-08 NOTE — Progress Notes (Signed)
SATURATION QUALIFICATIONS: (This note is used to comply with regulatory documentation for home oxygen)  Patient Saturations on Room Air at Rest = 88%  Patient Saturations on Room Air while Ambulating = 93%  Patient Saturations on N/a Liters of oxygen while Ambulating = NT%  Please briefly explain why patient needs home oxygen:  Oxygen saturation improved with ambulation, no need for home O2 unless desaturates while sleeping.  Magda Kiel, PT Acute Rehabilitation Services [phone] 08/08/2022

## 2022-08-08 NOTE — Progress Notes (Signed)
Progress Note    [name]   DJM:[id number]  DOB: 1975/08/23  DOA: 08/06/2022     0 PCP: Benito Mccreedy, MD  Initial CC: SOB  Hospital Course: Ms. Carano is a 47 yo female with PMH ESRD on HD, HTN, HLD, anxiety, DM II, CVA who presented with shortness of breath, weakness.  CXR on admission was concerning for right middle lobe opacity consistent with pneumonia.  She was started on Rocephin and azithromycin. After admission, we were informed that outpatient blood cultures from 08/05/2022 became positive for GPC, 4/4 bottles which have now speciated to Staph aureus.  Cultures were obtained due to chills she developed during dialysis session.  Interval History:  No events overnight. Right ankle pain a little better today.  Denies any fevers or chills.  Breathing appears comfortable as well. Outpatient blood cultures have updated to staph.   Assessment and Plan:  CAP Acute respiratory failure with hypoxia - Patient presenting with shortness of breath.  Found to have chest x-ray consistent with right middle lobe pneumonia and associated right pleural effusion.  Saturating 78% on room air on arrival, improved to the 90s on 1 to 2 L - some possible contribution from volume overload as well - continue weaning O2; needs walk test daily if still on O2 - s/p rocephin and azithro on admission; going to continue this since patient remaining in hospital   Staph aureus bacteremia - contacted by Dr. Moshe Cipro on 1/18; patient is growing 4/4 bottles of GPC which have speciated to SA so far (unknown still if MRSA vs MSSA) - given 2 g Ancef afternoon of 1/18 - since unclear if MRSA or not, will start Vanc now for coverage and continue rocephin/azithro for PNA coverage as above - repeat cultures obtained 08/07/22 - obtain echo; ordered 1/19 - auto ID consult   Right ankle pain - patient developed worsening Right ankle pain starting 1/17 (in the ER) and this progressed such that she  could not ambulate enough to discharge home 1/18 - CT right ankle obtained to rule out septic joint; mostly shows arthritis changes - continue pain control and supportive care   Transaminitis - Patient noted to have significant transaminitis with AST of 720 and ALT of 338.  Also has had some recent diarrhea.  States she ate pizza prior to diarrhea symptoms, which have improved. - negative hepatitis panel - suspect possible elevation in setting of infection and/or vascular congestion - LFTs improving   Thrombocytopenia - Unclear etiology possibly related to infection as well - trend CBC   ESRD on HD - dialyzed on 1/17 on admission - will need ongoing HD while inpatient; nephrology following    IDDM - continue insulin pump and monitor CBGs   History of CVA - Continue home asa   Hypertension - Continue home amlodipine and labetalol   Old records reviewed in assessment of this patient  Antimicrobials: Rocephin 1/17 >> current Azithro 1/18 >> current Vanc 1/18 >> current   DVT prophylaxis:  SCDs Start: 08/06/22 1722   Code Status:   Code Status: Full Code  Mobility Assessment (last 72 hours)     Mobility Assessment     Row Name 08/08/22 1201 08/08/22 0755 08/07/22 2133 08/07/22 1704 08/07/22 0251   Does patient have an order for bedrest or is patient medically unstable No - Continue assessment No - Continue assessment No - Continue assessment -- No - Continue assessment   What is the highest level of mobility based on the  progressive mobility assessment? Level 5 (Walks with assist in room/hall) - Balance while stepping forward/back and can walk in room with assist - Complete -- Level 4 (Walks with assist in room) - Balance while marching in place and cannot step forward and back - Complete Level 4 (Walks with assist in room) - Balance while marching in place and cannot step forward and back - Complete Level 2 (Chairfast) - Balance while sitting on edge of bed and cannot  stand   Is the above level different from baseline mobility prior to current illness? -- -- -- -- Yes - Recommend PT order            Barriers to discharge: none Disposition Plan:  Home 2-3 days Status is: Inpatient  Objective: Blood pressure 107/61, pulse 78, temperature 98 F (36.7 C), temperature source Oral, resp. rate 18, height 5\' 3"  (1.6 m), weight 55 kg, SpO2 93 %.  Examination:  Physical Exam Constitutional:      General: She is not in acute distress.    Appearance: She is well-developed.  HENT:     Head: Normocephalic and atraumatic.     Mouth/Throat:     Mouth: Mucous membranes are moist.  Eyes:     Extraocular Movements: Extraocular movements intact.  Cardiovascular:     Rate and Rhythm: Normal rate and regular rhythm.     Heart sounds: Murmur heard.     Comments: 3/6 HSM noted throughout precordium Pulmonary:     Effort: Pulmonary effort is normal. No respiratory distress.     Breath sounds: Rhonchi present.  Abdominal:     General: Bowel sounds are normal. There is no distension.     Palpations: Abdomen is soft.     Tenderness: There is no abdominal tenderness.  Musculoskeletal:        General: No swelling.     Cervical back: Normal range of motion.     Comments: Painful passive ROM in right ankle with TTP noted in lateral malleolus   Skin:    General: Skin is warm and dry.  Neurological:     General: No focal deficit present.     Mental Status: She is alert.  Psychiatric:        Mood and Affect: Mood normal.      Consultants:  Nephrology ID  Procedures:    Data Reviewed: Results for orders placed or performed during the hospital encounter of 08/06/22 (from the past 24 hour(s))  Culture, blood (Routine X 2) w Reflex to ID Panel     Status: None (Preliminary result)   Collection Time: 08/07/22  4:17 PM   Specimen: BLOOD LEFT FOREARM  Result Value Ref Range   Specimen Description BLOOD LEFT FOREARM    Special Requests      BOTTLES DRAWN  AEROBIC ONLY Blood Culture adequate volume   Culture      NO GROWTH < 24 HOURS Performed at [facility], [address], [place], [place] [address]    Report Status PENDING   Culture, blood (Routine X 2) w Reflex to ID Panel     Status: None (Preliminary result)   Collection Time: 08/07/22  4:18 PM   Specimen: BLOOD LEFT HAND  Result Value Ref Range   Specimen Description BLOOD LEFT HAND    Special Requests      BOTTLES DRAWN AEROBIC AND ANAEROBIC Blood Culture adequate volume   Culture      NO GROWTH < 24 HOURS Performed at [facility], [place] [address] [place], [place] [address]    Report Status PENDING   Glucose, capillary     Status: Abnormal   Collection Time: 08/07/22  5:39 PM  Result Value Ref Range   Glucose-Capillary 206 (H) 70 - 99 mg/dL  Glucose, capillary     Status: Abnormal   Collection Time: 08/07/22  8:28 PM  Result Value Ref Range   Glucose-Capillary 226 (H) 70 - 99 mg/dL  Glucose, capillary     Status: Abnormal   Collection Time: 08/08/22  2:13 AM  Result Value Ref Range   Glucose-Capillary 176 (H) 70 - 99 mg/dL  CBC with Differential/Platelet     Status: Abnormal   Collection Time: 08/08/22  3:39 AM  Result Value Ref Range   WBC 6.8 4.0 - 10.5 K/uL   RBC 3.31 (L) 3.87 - 5.11 MIL/uL   Hemoglobin 9.2 (L) 12.0 - 15.0 g/dL   HCT 27.9 (L) 36.0 - 46.0 %   MCV 84.3 80.0 - 100.0 fL   MCH 27.8 26.0 - 34.0 pg   MCHC 33.0 30.0 - 36.0 g/dL   RDW 17.9 (H) 11.5 - 15.5 %   Platelets 104 (L) 150 - 400 K/uL   nRBC 0.0 0.0 - 0.2 %   Neutrophils Relative % 78 %   Neutro Abs 5.3 1.7 - 7.7 K/uL   Lymphocytes Relative 12 %   Lymphs Abs 0.8 0.7 - 4.0 K/uL   Monocytes Relative 8 %   Monocytes Absolute 0.5 0.1 - 1.0 K/uL   Eosinophils Relative 1 %   Eosinophils Absolute 0.1 0.0 - 0.5 K/uL   Basophils Relative 0 %   Basophils Absolute 0.0 0.0 - 0.1 K/uL   Immature Granulocytes 1 %   Abs Immature Granulocytes 0.04 0.00 - 0.07 K/uL  Magnesium     Status:  None   Collection Time: 08/08/22  3:39 AM  Result Value Ref Range   Magnesium 2.3 1.7 - 2.4 mg/dL  Comprehensive metabolic panel     Status: Abnormal   Collection Time: 08/08/22  3:39 AM  Result Value Ref Range   Sodium 134 (L) 135 - 145 mmol/L   Potassium 3.7 3.5 - 5.1 mmol/L   Chloride 91 (L) 98 - 111 mmol/L   CO2 25 22 - 32 mmol/L   Glucose, Bld 154 (H) 70 - 99 mg/dL   BUN 39 (H) 6 - 20 mg/dL   Creatinine, Ser 6.94 (H) 0.44 - 1.00 mg/dL   Calcium 8.9 8.9 - 10.3 mg/dL   Total Protein 6.3 (L) 6.5 - 8.1 g/dL   Albumin 3.0 (L) 3.5 - 5.0 g/dL   AST 160 (H) 15 - 41 U/L   ALT 152 (H) 0 - 44 U/L   Alkaline Phosphatase 53 38 - 126 U/L   Total Bilirubin 1.0 0.3 - 1.2 mg/dL   GFR, Estimated 7 (L) >60 mL/min   Anion gap 18 (H) 5 - 15  Glucose, capillary     Status: Abnormal   Collection Time: 08/08/22  8:05 AM  Result Value Ref Range   Glucose-Capillary 131 (H) 70 - 99 mg/dL   Comment 1 Notify RN    Comment 2 Document in Chart   Glucose, capillary     Status: None   Collection Time: 08/08/22 12:17 PM  Result Value Ref Range   Glucose-Capillary 99 70 - 99 mg/dL   Comment 1 Notify RN    Comment 2 Document in Chart     I have  reviewed pertinent nursing notes, vitals, labs, and images as necessary. I have ordered labwork to follow up on as indicated.  I have reviewed the last notes from staff over past 24 hours. I have discussed patient's care plan and test results with nursing staff, CM/SW, and other staff as appropriate.  Time spent: Greater than 50% of the 55 minute visit was spent in counseling/coordination of care for the patient as laid out in the A&P.   LOS: 0 days   Dwyane Dee, MD [facility] 08/08/2022, 2:14 PM

## 2022-08-08 NOTE — Progress Notes (Signed)
Physical Therapy Treatment Patient Details Name: [name] MRN: [id number] DOB: October 10, 1975 Today's Date: 08/08/2022   History of Present Illness [name] is a 47 y.o. female with medical history significant of pleural effusion, hypertension, hyperlipidemia, anxiety, diabetes, ESRD, stroke presenting with shortness of breath.  Found to be negative for Flu, Covid and RSV.    PT Comments    Patient reports pain in R leg improved and feels stretches have helped.  She was able to ambulate in hallway with less assistance and stood without walker.  She was on RA at rest and monitored SpO2 throughout with improvement during mobility.  She continues to be appropriate for HHPT at d/c.    Recommendations for follow up therapy are one component of a multi-disciplinary discharge planning process, led by the attending physician.  Recommendations may be updated based on patient status, additional functional criteria and insurance authorization.  Follow Up Recommendations  Home health PT     Assistance Recommended at Discharge Intermittent Supervision/Assistance  Patient can return home with the following A little help with walking and/or transfers;A little help with bathing/dressing/bathroom;Assist for transportation;Help with stairs or ramp for entrance   Equipment Recommendations  Rolling walker (2 wheels)    Recommendations for Other Services       Precautions / Restrictions Precautions Precautions: Fall     Mobility  Bed Mobility Overal bed mobility: Modified Independent                  Transfers Overall transfer level: Modified independent Equipment used: None Transfers: Sit to/from Stand             General transfer comment: stood from EOB no device    Ambulation/Gait Ambulation/Gait assistance: Supervision Gait Distance (Feet): 140 Feet Assistive device: Rolling walker (2 wheels) Gait Pattern/deviations: Step-through pattern, Step-to pattern        General Gait Details: slow pace, with RW no LOB and reports pain improved using stretches in hallway as needed   Stairs             Wheelchair Mobility    Modified Rankin (Stroke Patients Only)       Balance Overall balance assessment: Needs assistance   Sitting balance-Leahy Scale: Good       Standing balance-Leahy Scale: Fair                              Cognition Arousal/Alertness: Awake/alert Behavior During Therapy: WFL for tasks assessed/performed Overall Cognitive Status: Within Functional Limits for tasks assessed                                          Exercises      General Comments General comments (skin integrity, edema, etc.): daughter in the room, pt completing HEP upon PT entry, clarified which were more for her hip; encouraged to keep notes of questions for PT when they come      Pertinent Vitals/Pain Pain Assessment Pain Score: 5  Pain Location: R ankle to hip with ambulation Pain Descriptors / Indicators: Tender, Aching, Discomfort Pain Intervention(s): Monitored during session    Home Living                          Prior Function  PT Goals (current goals can now be found in the care plan section) Progress towards PT goals: Progressing toward goals    Frequency    Min 3X/week      PT Plan Current plan remains appropriate    Co-evaluation              AM-PAC PT "6 Clicks" Mobility   Outcome Measure  Help needed turning from your back to your side while in a flat bed without using bedrails?: None Help needed moving from lying on your back to sitting on the side of a flat bed without using bedrails?: None Help needed moving to and from a bed to a chair (including a wheelchair)?: None Help needed standing up from a chair using your arms (e.g., wheelchair or bedside chair)?: A Little Help needed to walk in hospital room?: A Little Help needed climbing 3-5 steps  with a railing? : Total 6 Click Score: 19    End of Session Equipment Utilized During Treatment: Gait belt Activity Tolerance: Patient tolerated treatment well Patient left: in bed;with call bell/phone within reach;with family/visitor present   PT Visit Diagnosis: Other abnormalities of gait and mobility (R26.89);Pain Pain - Right/Left: Right Pain - part of body: Leg;Hip     Time: 0940-1000 PT Time Calculation (min) (ACUTE ONLY): 20 min  Charges:  $Gait Training: 8-22 mins                     Magda Kiel, PT Acute Rehabilitation Services Office:[phone] 08/08/2022    [name] 08/08/2022, 12:02 PM

## 2022-08-08 NOTE — Progress Notes (Addendum)
Contacted by pt's out-pt clinic this morning to inquire if pt will be at treatment tomorrow. Contacted clinic this afternoon and advised clinic that pt will not d/c today. Pt's normal chair time is 8:55 am (TTS) but clinic states they can treat pt tomorrow if pt arrives by 11:00 if pt d/c in the am. Contacted attending and nephrology to inquire about possible d/c date. Pt likely not stable for d/c tomorrow morning per renal PA and attending. Will assist as needed.   Melven Sartorius Renal Navigator [phone]

## 2022-08-08 NOTE — Plan of Care (Signed)
  Problem: Activity: Goal: Ability to tolerate increased activity will improve Outcome: Progressing   Problem: Education: Goal: Knowledge of General Education information will improve Description: Including pain rating scale, medication(s)/side effects and non-pharmacologic comfort measures Outcome: Progressing   

## 2022-08-08 NOTE — Progress Notes (Signed)
Echocardiogram 2D Echocardiogram has been performed.  Frances Furbish 08/08/2022, 3:13 PM

## 2022-08-08 NOTE — Progress Notes (Signed)
SATURATION QUALIFICATIONS: (This note is used to comply with regulatory documentation for home oxygen)  Patient Saturations on Room Air at Rest = 92%  Patient Saturations on Room Air while Ambulating = 86%  Patient Saturations on 2 Liters of oxygen while Ambulating = 99%  Please briefly explain why patient needs home oxygen: 

## 2022-08-08 NOTE — Progress Notes (Addendum)
[facility] Progress Note   Subjective:   Patient seen and examined at bedside.  Reports pain in right ankle extending  up to her hip when trying to stand.  Pain in ankle started on Wednesday, prior to that she was not having an issue.  Denies fever, chills, CP, edema, orthopnea, abdominal pain and n/v/d.  Admits to shortness of breath when trying to ambulate with oxygen.    Objective Vitals:   08/07/22 1739 08/07/22 2026 08/08/22 0417 08/08/22 0808  BP: 126/66 (!) 130/59 134/70 137/73  Pulse: 85 86 75 79  Resp:  18 18 18   Temp: 99.1 F (37.3 C) 98.4 F (36.9 C) 98 F (36.7 C) 98 F (36.7 C)  TempSrc: Oral Oral Oral Oral  SpO2:  91% 100% 97%  Weight:      Height:       Physical Exam General:WDWN female in NAD Heart:RRR, no mrg Lungs:BS decreased on R, nml WOB on 2L O2 via [place].   Abdomen:soft, NTND Extremities:no LE edema, +tenderness on lateral L ankle Dialysis Access: RU AVG +b/t - no warmth, erythema or drainage   Filed Weights   08/06/22 2150 08/07/22 0200 08/07/22 0259  Weight: 57.2 kg 55.2 kg 55 kg    Intake/Output Summary (Last 24 hours) at 08/08/2022 0857 Last data filed at 08/07/2022 1744 Gross per 24 hour  Intake 440 ml  Output --  Net 440 ml    Additional Objective Labs: Basic Metabolic Panel: Recent Labs  Lab 08/06/22 1154 08/06/22 2025 08/07/22 0458 08/08/22 0339  NA 136  --  134* 134*  K 4.0  --  3.7 3.7  CL 92*  --  92* 91*  CO2 28  --  22 25  GLUCOSE 101*  --  126* 154*  BUN 28*  --  17 39*  CREATININE 7.36*  --  4.67* 6.94*  CALCIUM 8.5*  --  9.0 8.9  PHOS  --  7.1*  --   --    Liver Function Tests: Recent Labs  Lab 08/06/22 1154 08/07/22 0458 08/08/22 0339  AST 720* 353* 160*  ALT 338* 266* 152*  ALKPHOS 49 48 53  BILITOT 0.9 1.4* 1.0  PROT 6.6 6.6 6.3*  ALBUMIN 3.4* 3.2* 3.0*   Recent Labs  Lab 08/06/22 1154  LIPASE 24   CBC: Recent Labs  Lab 08/06/22 1154 08/07/22 0458 08/08/22 0339  WBC 7.8 8.7 6.8   NEUTROABS 6.3  --  5.3  HGB 10.1* 8.8* 9.2*  HCT 32.5* 28.3* 27.9*  MCV 88.3 87.9 84.3  PLT 66* 87* 104*   Blood Culture    Component Value Date/Time   SDES BLOOD LEFT HAND 08/07/2022 1618   SPECREQUEST  08/07/2022 1618    BOTTLES DRAWN AEROBIC AND ANAEROBIC Blood Culture adequate volume   CULT  08/07/2022 1618    NO GROWTH < 24 HOURS Performed at [facility], [place] [address], [place], [place] [address]    REPTSTATUS PENDING 08/07/2022 1618    CBG: Recent Labs  Lab 08/07/22 1217 08/07/22 1739 08/07/22 2028 08/08/22 0213 08/08/22 0805  GLUCAP 187* 206* 226* 176* 131*   Lab Results  Component Value Date   INR 1.1 10/21/2019   INR 1.15 04/16/2018   INR 1.04 03/29/2014   Studies/Results: CT ANKLE RIGHT W CONTRAST  Result Date: 08/07/2022 CLINICAL DATA:  Bacteremia, right ankle pain EXAM: CT OF THE RIGHT ANKLE WITH CONTRAST TECHNIQUE: Multidetector CT imaging of the right ankle was performed following the  standard protocol during bolus administration of intravenous contrast. RADIATION DOSE REDUCTION: This exam was performed according to the departmental dose-optimization program which includes automated exposure control, adjustment of the mA and/or kV according to patient size and/or use of iterative reconstruction technique. CONTRAST:  25mL OMNIPAQUE IOHEXOL 350 MG/ML SOLN COMPARISON:  None Available. FINDINGS: Bones/Joint/Cartilage No fracture or acute bony findings. There is some degenerative loss of articular space at the calcaneocuboid articulation. No tibiotalar joint effusion or substantial effusion of the distal tibiofibular articulation. No effacement of the adipose tissue in the sinus tarsi. No subtalar joint effusion is appreciated. Ligaments Suboptimally assessed by CT. Muscles and Tendons No significant tenosynovitis observed. Soft tissues Atherosclerosis noted.  Mild subcutaneous edema along the heel. IMPRESSION: 1. Mild degenerative loss of articular space at  the calcaneocuboid articulation. 2. Mild subcutaneous edema along the heel. 3. Atherosclerosis. Electronically Signed   By: Van Clines M.D.   On: 08/07/2022 21:36   DG Chest 2 View  Result Date: 08/06/2022 CLINICAL DATA:  Shortness of breath EXAM: CHEST - 2 VIEW COMPARISON:  Chest x-ray March 08, 2021 FINDINGS: Unchanged cardiomegaly. Stable mediastinal contours. Confluent opacity in the right middle lobe. Moderate right pleural effusion. No focal opacity in the left lung. No large pneumothorax. The visualized upper abdomen is unremarkable. No acute osseous abnormality. IMPRESSION: 1. Right middle lobe confluent opacity, concerning for pneumonia. Recommend treatment of acute illness and repeat chest radiograph in 6-8 weeks to ensure resolution and exclude underlying malignancy. 2. Moderate right pleural effusion. Electronically Signed   By: Beryle Flock M.D.   On: 08/06/2022 13:22    Medications:  anticoagulant sodium citrate     azithromycin 250 mg (08/07/22 1012)   cefTRIAXone (ROCEPHIN)  IV     [START ON 08/09/2022] vancomycin      acetaminophen  1,000 mg Oral Q6H   amLODipine  10 mg Oral Daily   aspirin EC  81 mg Oral Daily   Chlorhexidine Gluconate Cloth  6 each Topical Q0600   heparin  2,000 Units Dialysis Once in dialysis   insulin pump   Subcutaneous TID WC, HS, 0200   labetalol  300 mg Oral Daily   sodium chloride flush  3 mL Intravenous Q12H    Dialysis Orders: [facility] TTS  425 / 800, 2K 2 Ca, EDW 58.4 (left at 57.8 on 1/16), 3:30, AVF UFP4  venofer 50 weekly, Mircera 50q2 (given 1/16) Heparin 2K Units hectorol 2 TIW, sensipar 30 TIW    Assessment/Plan: 1. Hypoxia/CAP - Initially improved after dialysis. Now back on 2L O2.  On ceftriaxone and azithromycin. 2. Bacteremia - BC at OP center +GPC.  Recollected BC here.  On Vanc. 3. R ankle pain - CT ankle with mild degenerative loss of articular space at calcaneocuboid articulation and mild subcutaneous  edema along the heel.  Per PMD. 4. ESRD on dialysis since 09/2010 - on HD TTS.  Continue on schedule  5. Anemia of CKD-  Hgb 9.2. No indication for ESA, just given on 1/16.  6. Secondary hyperparathyroidism -  Calcium in goal.  Phos elevated.  Continue VDRA, binders and sensipar for now 7. HTN/volume - BP well controlled.  Continue home meds. Under EDW if weights correct, will need lower dry, standing weight post HD if able.  8. H/o right sided pleural effusion + h/o CT in the past. Moderate R pleural effusion on CXR 08/06/22.  9. Nutrition - Renal diet w/fluid restrictions. Alb 3.4. +protein supplements. 10. DM since age 42  11. PAD   Jen Mow, PA-C [facility] 08/08/2022,8:57 AM  LOS: 0 days

## 2022-08-09 DIAGNOSIS — J9601 Acute respiratory failure with hypoxia: Secondary | ICD-10-CM | POA: Diagnosis not present

## 2022-08-09 DIAGNOSIS — R7881 Bacteremia: Secondary | ICD-10-CM | POA: Diagnosis not present

## 2022-08-09 DIAGNOSIS — J189 Pneumonia, unspecified organism: Secondary | ICD-10-CM | POA: Diagnosis not present

## 2022-08-09 DIAGNOSIS — N186 End stage renal disease: Secondary | ICD-10-CM | POA: Diagnosis not present

## 2022-08-09 LAB — CBC WITH DIFFERENTIAL/PLATELET
Abs Immature Granulocytes: 0.04 10*3/uL (ref 0.00–0.07)
Basophils Absolute: 0 10*3/uL (ref 0.0–0.1)
Basophils Relative: 0 %
Eosinophils Absolute: 0.1 10*3/uL (ref 0.0–0.5)
Eosinophils Relative: 1 %
HCT: 28 % — ABNORMAL LOW (ref 36.0–46.0)
Hemoglobin: 9.2 g/dL — ABNORMAL LOW (ref 12.0–15.0)
Immature Granulocytes: 1 %
Lymphocytes Relative: 9 %
Lymphs Abs: 0.7 10*3/uL (ref 0.7–4.0)
MCH: 27.5 pg (ref 26.0–34.0)
MCHC: 32.9 g/dL (ref 30.0–36.0)
MCV: 83.8 fL (ref 80.0–100.0)
Monocytes Absolute: 0.8 10*3/uL (ref 0.1–1.0)
Monocytes Relative: 9 %
Neutro Abs: 6.4 10*3/uL (ref 1.7–7.7)
Neutrophils Relative %: 80 %
Platelets: 109 10*3/uL — ABNORMAL LOW (ref 150–400)
RBC: 3.34 MIL/uL — ABNORMAL LOW (ref 3.87–5.11)
RDW: 18 % — ABNORMAL HIGH (ref 11.5–15.5)
WBC: 8 10*3/uL (ref 4.0–10.5)
nRBC: 0.4 % — ABNORMAL HIGH (ref 0.0–0.2)

## 2022-08-09 LAB — COMPREHENSIVE METABOLIC PANEL
ALT: 47 U/L — ABNORMAL HIGH (ref 0–44)
AST: 117 U/L — ABNORMAL HIGH (ref 15–41)
Albumin: 3 g/dL — ABNORMAL LOW (ref 3.5–5.0)
Alkaline Phosphatase: 63 U/L (ref 38–126)
Anion gap: 19 — ABNORMAL HIGH (ref 5–15)
BUN: 49 mg/dL — ABNORMAL HIGH (ref 6–20)
CO2: 22 mmol/L (ref 22–32)
Calcium: 9.1 mg/dL (ref 8.9–10.3)
Chloride: 91 mmol/L — ABNORMAL LOW (ref 98–111)
Creatinine, Ser: 8.41 mg/dL — ABNORMAL HIGH (ref 0.44–1.00)
GFR, Estimated: 6 mL/min — ABNORMAL LOW (ref >60)
Glucose, Bld: 173 mg/dL — ABNORMAL HIGH (ref 70–99)
Potassium: 3.7 mmol/L (ref 3.5–5.1)
Sodium: 132 mmol/L — ABNORMAL LOW (ref 135–145)
Total Bilirubin: 0.6 mg/dL (ref 0.3–1.2)
Total Protein: 6.6 g/dL (ref 6.5–8.1)

## 2022-08-09 LAB — GLUCOSE, CAPILLARY
Glucose-Capillary: 187 mg/dL — ABNORMAL HIGH (ref 70–99)
Glucose-Capillary: 99 mg/dL (ref 70–99)
Glucose-Capillary: 120 mg/dL — ABNORMAL HIGH (ref 70–99)

## 2022-08-09 LAB — PHOSPHORUS: Phosphorus: 6 mg/dL — ABNORMAL HIGH (ref 2.5–4.6)

## 2022-08-09 LAB — MAGNESIUM: Magnesium: 2.3 mg/dL (ref 1.7–2.4)

## 2022-08-09 MED ORDER — CEFAZOLIN SODIUM-DEXTROSE 2-4 GM/100ML-% IV SOLN
2.0000 g | INTRAVENOUS | Status: DC
  Administered 2022-08-09: 2 g via INTRAVENOUS
  Filled 2022-08-09: qty 100

## 2022-08-09 NOTE — Care Plan (Signed)
[name] [birth year] [id number]   Susceptibilities From Blood Cultures Collected at Outpatient Dialysis reported by [facility] are as follows:   Staphylococcus aureus This isolate is presumed to be resistant to Clindamycin based on the detection of inducible Clindamycin resistance. Clindamycin may still be effective with some patients. Contact laboratory within three days if penicillin results needed.  ANTIBIOTIC                                       [MIC ]                      SENSITIVITY  CLINDAMYCIN                                 [<=0.25 ]                     R ERYTHROMYCIN                             [>=8.0 ]                       R GENTAMICIN                                    [<=0.5 ]                       S LEVOFLOXACIN                               [0.5 ]                           S LINEZOLID                                        [2.0 ]                           S MOXIFLOXACIN                               [<=0.25 ]                     S OXACILLIN                                       [0.5 ]                           S QUINUPRISTIN/DALFOPRISTIN     [0.5 ]                           S RIFAMPIN                                         [<=  0.5 ]                       S TETRACYCLINE                              [<=1.0 ]                        S TRIMETHOPRIM/SULFA                  [<=10.0 ]                     S VANCOMYCIN                                  [1.0 ]                           S    Jen Mow, PA-C [facility]

## 2022-08-09 NOTE — Progress Notes (Signed)
Pt received in bed no c/os no distress noted stable for HD via RUA AVG

## 2022-08-09 NOTE — Progress Notes (Signed)
Outpt blood cultures came back with MSSA. D/w Dr. Sabino Gasser and we will change vanc to Cefazolin 2g IV TTS. Pt has gotten HD today so we will start now.  Onnie Boer, PharmD, BCIDP, AAHIVP, CPP Infectious Disease Pharmacist 08/09/2022 5:44 PM

## 2022-08-09 NOTE — Progress Notes (Signed)
ID Brief note  Blood cx from HD center identified as MSSA,  Stop Vancomycin and start cefazolin TEE possibly on Tuesday Fu repeat blood cx 1/18  Rosiland Oz, MD Infectious Disease Physician [facility] [address] [place], [place] [address] Phone: [phone]  Fax: [phone]

## 2022-08-09 NOTE — Progress Notes (Signed)
   08/09/22 1445  Vitals  Temp 98 F (36.7 C)  Pulse Rate 80  Resp 20  BP (!) 166/81  SpO2 100 %  O2 Device Nasal Cannula  Post Treatment  Dialyzer Clearance Clear  Duration of HD Treatment -hour(s) 3.5 hour(s)  Hemodialysis Intake (mL) 0 mL  Liters Processed 74.4  Fluid Removed (mL) 2600 mL  Tolerated HD Treatment Yes  AVG/AVF Arterial Site Held (minutes) 10 minutes  AVG/AVF Venous Site Held (minutes) 10 minutes   Received patient in bed to unit.  Alert and oriented.  Informed consent signed and in chart.    Patient tolerated well.  Transported back to the room  Alert, without acute distress.  Hand-off given to patient's nurse.   Access used: RUA AVG Access issues: none  Total UF removed: 2.6L Medication(s) given: VANC    [facility]

## 2022-08-09 NOTE — Progress Notes (Signed)
[facility] Progress Note   Subjective:   Patient seen and examined at bedside.  Reports ankle feeling better with PT and tylenol.  Denies CP, SOB, abdominal pain and n/v/d. Not wearing oxygen currently, states "I keep forgetting to put it on."  Objective Vitals:   08/08/22 1617 08/08/22 2050 08/09/22 0500 08/09/22 0505  BP: 134/65 132/73  (!) 146/73  Pulse: 77 83  95  Resp: 18     Temp: 97.9 F (36.6 C) 98.4 F (36.9 C)  99.6 F (37.6 C)  TempSrc: Oral Oral  Oral  SpO2: 100% 99%  94%  Weight:   58.1 kg   Height:       Physical Exam General:well appearing female in NAD Heart:RRR, no mrg Lungs:BS decreased on R, nml WOB on RA Abdomen:soft, NTND Extremities:no LE edema Dialysis Access: RU AVG +b/t - no warmth, erythema or drainage   Filed Weights   08/07/22 0200 08/07/22 0259 08/09/22 0500  Weight: 55.2 kg 55 kg 58.1 kg    Intake/Output Summary (Last 24 hours) at 08/09/2022 0723 Last data filed at 08/08/2022 1545 Gross per 24 hour  Intake 356 ml  Output --  Net 356 ml    Additional Objective Labs: Basic Metabolic Panel: Recent Labs  Lab 08/06/22 2025 08/07/22 0458 08/08/22 0339 08/09/22 0313  NA  --  134* 134* 132*  K  --  3.7 3.7 3.7  CL  --  92* 91* 91*  CO2  --  22 25 22   GLUCOSE  --  126* 154* 173*  BUN  --  17 39* 49*  CREATININE  --  4.67* 6.94* 8.41*  CALCIUM  --  9.0 8.9 9.1  PHOS 7.1*  --   --  6.0*   Liver Function Tests: Recent Labs  Lab 08/07/22 0458 08/08/22 0339 08/09/22 0313  AST 353* 160* 117*  ALT 266* 152* 47*  ALKPHOS 48 53 63  BILITOT 1.4* 1.0 0.6  PROT 6.6 6.3* 6.6  ALBUMIN 3.2* 3.0* 3.0*   Recent Labs  Lab 08/06/22 1154  LIPASE 24   CBC: Recent Labs  Lab 08/06/22 1154 08/07/22 0458 08/08/22 0339 08/09/22 0313  WBC 7.8 8.7 6.8 8.0  NEUTROABS 6.3  --  5.3 6.4  HGB 10.1* 8.8* 9.2* 9.2*  HCT 32.5* 28.3* 27.9* 28.0*  MCV 88.3 87.9 84.3 83.8  PLT 66* 87* 104* 109*   Blood Culture    Component  Value Date/Time   SDES BLOOD LEFT HAND 08/07/2022 1618   SPECREQUEST  08/07/2022 1618    BOTTLES DRAWN AEROBIC AND ANAEROBIC Blood Culture adequate volume   CULT  08/07/2022 1618    NO GROWTH < 24 HOURS Performed at [facility], [address], [place], [place] [address]    REPTSTATUS PENDING 08/07/2022 1618   CBG: Recent Labs  Lab 08/08/22 0213 08/08/22 0805 08/08/22 1217 08/08/22 1617 08/08/22 2054  GLUCAP 176* 131* 99 132* 120*     Studies/Results: ECHOCARDIOGRAM COMPLETE  Result Date: 08/08/2022    ECHOCARDIOGRAM REPORT   Patient Name:   [name] Date of Exam: 08/08/2022 Medical Rec #:  [id number]         Height:       63.0 in Accession #:    [id number]        Weight:       121.3 lb Date of Birth:  04-May-1976        BSA:          1.563  m Patient Age:    47 years          BP:           107/61 mmHg Patient Gender: F                 HR:           76 bpm. Exam Location:  Inpatient Procedure: 2D Echo, Cardiac Doppler and Color Doppler Indications:    bacteremia  History:        Patient has prior history of Echocardiogram examinations. CHF;                 Risk Factors:Diabetes, Dyslipidemia and Hypertension.  Sonographer:    Phineas Douglas Referring Phys: [place]  1. Left ventricular ejection fraction, by estimation, is 55 to 60%. The left ventricle has normal function. The left ventricle has no regional wall motion abnormalities. There is mild left ventricular hypertrophy. Left ventricular diastolic parameters are consistent with Grade II diastolic dysfunction (pseudonormalization). Elevated left atrial pressure.  2. Right ventricular systolic function is normal. The right ventricular size is mildly enlarged. There is moderately elevated pulmonary artery systolic pressure. The estimated right ventricular systolic pressure is 69.6 mmHg.  3. Left atrial size was mildly dilated.  4. Right atrial size was mildly dilated.  5. The mitral valve is normal in  structure. Trivial mitral valve regurgitation.  6. Tricuspid valve regurgitation is moderate.  7. The aortic valve is tricuspid. Aortic valve regurgitation is not visualized. Aortic valve sclerosis is present, with no evidence of aortic valve stenosis.  8. The inferior vena cava is dilated in size with <50% respiratory variability, suggesting right atrial pressure of 15 mmHg. Conclusion(s)/Recommendation(s): No evidence of valvular vegetations on this transthoracic echocardiogram. Consider a transesophageal echocardiogram to exclude infective endocarditis if clinically indicated. FINDINGS  Left Ventricle: Left ventricular ejection fraction, by estimation, is 55 to 60%. The left ventricle has normal function. The left ventricle has no regional wall motion abnormalities. The left ventricular internal cavity size was normal in size. There is  mild left ventricular hypertrophy. Left ventricular diastolic parameters are consistent with Grade II diastolic dysfunction (pseudonormalization). Elevated left atrial pressure. Right Ventricle: The right ventricular size is mildly enlarged. Right vetricular wall thickness was not well visualized. Right ventricular systolic function is normal. There is moderately elevated pulmonary artery systolic pressure. The tricuspid regurgitant velocity is 3.33 m/s, and with an assumed right atrial pressure of 15 mmHg, the estimated right ventricular systolic pressure is 29.5 mmHg. Left Atrium: Left atrial size was mildly dilated. Right Atrium: Right atrial size was mildly dilated. Pericardium: Trivial pericardial effusion is present. Mitral Valve: The mitral valve is normal in structure. Trivial mitral valve regurgitation. Tricuspid Valve: The tricuspid valve is normal in structure. Tricuspid valve regurgitation is moderate. Aortic Valve: The aortic valve is tricuspid. Aortic valve regurgitation is not visualized. Aortic valve sclerosis is present, with no evidence of aortic valve stenosis.  Pulmonic Valve: The pulmonic valve was not well visualized. Pulmonic valve regurgitation is trivial. Aorta: The aortic root and ascending aorta are structurally normal, with no evidence of dilitation. Venous: The inferior vena cava is dilated in size with less than 50% respiratory variability, suggesting right atrial pressure of 15 mmHg. IAS/Shunts: The interatrial septum was not well visualized.  LEFT VENTRICLE PLAX 2D LVIDd:         4.00 cm      Diastology LVIDs:         3.00  cm      LV e' medial:    6.13 cm/s LV PW:         1.20 cm      LV E/e' medial:  19.4 LV IVS:        1.20 cm      LV e' lateral:   9.20 cm/s LVOT diam:     1.60 cm      LV E/e' lateral: 12.9 LV SV:         53 LV SV Index:   34 LVOT Area:     2.01 cm  LV Volumes (MOD) LV vol d, MOD A2C: 129.0 ml LV vol d, MOD A4C: 122.0 ml LV vol s, MOD A2C: 60.2 ml LV vol s, MOD A4C: 53.7 ml LV SV MOD A2C:     68.8 ml LV SV MOD A4C:     122.0 ml LV SV MOD BP:      72.0 ml RIGHT VENTRICLE             IVC RV Basal diam:  4.30 cm     IVC diam: 2.40 cm RV S prime:     13.70 cm/s TAPSE (M-mode): 1.6 cm LEFT ATRIUM             Index        RIGHT ATRIUM           Index LA diam:        3.80 cm 2.43 cm/m   RA Area:     18.70 cm LA Vol (A2C):   60.4 ml 38.64 ml/m  RA Volume:   56.70 ml  36.27 ml/m LA Vol (A4C):   59.8 ml 38.26 ml/m LA Biplane Vol: 60.1 ml 38.45 ml/m  AORTIC VALVE             PULMONIC VALVE LVOT Vmax:   129.00 cm/s PR End Diast Vel: 5.66 msec LVOT Vmean:  86.900 cm/s LVOT VTI:    0.262 m  AORTA Ao Root diam: 2.50 cm Ao Asc diam:  2.90 cm MITRAL VALVE                TRICUSPID VALVE MV Area (PHT): 4.17 cm     TR Peak grad:   44.4 mmHg MV Decel Time: 182 msec     TR Vmax:        333.00 cm/s MV E velocity: 119.00 cm/s MV A velocity: 91.80 cm/s   SHUNTS MV E/A ratio:  1.30         Systemic VTI:  0.26 m                             Systemic Diam: 1.60 cm Oswaldo Milian MD Electronically signed by Oswaldo Milian MD Signature Date/Time:  08/08/2022/4:25:03 PM    Final    CT ANKLE RIGHT W CONTRAST  Result Date: 08/07/2022 CLINICAL DATA:  Bacteremia, right ankle pain EXAM: CT OF THE RIGHT ANKLE WITH CONTRAST TECHNIQUE: Multidetector CT imaging of the right ankle was performed following the standard protocol during bolus administration of intravenous contrast. RADIATION DOSE REDUCTION: This exam was performed according to the departmental dose-optimization program which includes automated exposure control, adjustment of the mA and/or kV according to patient size and/or use of iterative reconstruction technique. CONTRAST:  60mL OMNIPAQUE IOHEXOL 350 MG/ML SOLN COMPARISON:  None Available. FINDINGS: Bones/Joint/Cartilage No fracture or acute bony findings. There is some degenerative loss of articular space at the  calcaneocuboid articulation. No tibiotalar joint effusion or substantial effusion of the distal tibiofibular articulation. No effacement of the adipose tissue in the sinus tarsi. No subtalar joint effusion is appreciated. Ligaments Suboptimally assessed by CT. Muscles and Tendons No significant tenosynovitis observed. Soft tissues Atherosclerosis noted.  Mild subcutaneous edema along the heel. IMPRESSION: 1. Mild degenerative loss of articular space at the calcaneocuboid articulation. 2. Mild subcutaneous edema along the heel. 3. Atherosclerosis. Electronically Signed   By: Van Clines M.D.   On: 08/07/2022 21:36    Medications:  anticoagulant sodium citrate     vancomycin      acetaminophen  1,000 mg Oral Q6H   amLODipine  10 mg Oral Daily   aspirin EC  81 mg Oral Daily   Chlorhexidine Gluconate Cloth  6 each Topical Q0600   heparin  2,000 Units Dialysis Once in dialysis   insulin pump   Subcutaneous TID WC, HS, 0200   labetalol  300 mg Oral Daily   sodium chloride flush  3 mL Intravenous Q12H    Dialysis Orders: [facility]TS  425 / 800, 2K 2 Ca, EDW 58.4 (left at 57.8 on 1/16), 3:30, AVF UFP4  venofer 50  weekly, Mircera 50q2 (given 1/16) Heparin 2K Units hectorol 2 TIW, sensipar 30 TIW     Assessment/Plan: 1. Hypoxia/CAP/pleural effusion - Initially improved after dialysis. Now back on 2L O2. ID suspects likely Staph A related. Ceftriaxone/azithromycin stopped.  Plan for home O2 as of now. 2. Staph aureus Bacteremia - BC at OP center +staph aureus.  Susceptibilities still pending as of now.  Will recheck later today. Recollected BC here.  On Vanc.  ID following, plan for TTE and possible TEE to r/o endocarditis.  3. R ankle pain - CT ankle with mild degenerative loss of articular space at calcaneocuboid articulation and mild subcutaneous edema along the heel.  Improving w/PT. Per PMD. 4. ESRD on dialysis since 09/2010 - on HD TTS.  Continue on schedule  5. Anemia of CKD-  Hgb 9.2. No indication for ESA, just given on 1/16.  6. Secondary hyperparathyroidism -  Calcium in goal.  Phos elevated but improving.  Continue VDRA, binders and sensipar for now 7. HTN/volume - BP well controlled.  Continue home meds. Under EDW if weights correct, will need lower dry, standing weight post HD if able.  8. H/o right sided pleural effusion + h/o CT in the past. Moderate R pleural effusion on CXR 08/06/22.  9. Nutrition - Renal diet w/fluid restrictions. Alb 3.4. +protein supplements. 10. DM since age 89 11. PAD  Jen Mow, PA-C [facility] 08/09/2022,7:23 AM  LOS: 1 day

## 2022-08-09 NOTE — Progress Notes (Signed)
Progress Note    [name]   RCV:[id number]  DOB: [birth year]  DOA: 08/06/2022     1 PCP: Benito Mccreedy, MD  Initial CC: SOB  Hospital Course: [name] is a 47 yo female with PMH ESRD on HD, HTN, HLD, anxiety, DM II, CVA who presented with shortness of breath, weakness.  CXR on admission was concerning for right middle lobe opacity consistent with pneumonia.  She was started on Rocephin and azithromycin. After admission, we were informed that outpatient blood cultures from 08/05/2022 became positive for GPC, 4/4 bottles which have now speciated to Staph aureus.  Cultures were obtained due to chills she developed during dialysis session.  Interval History:  No events overnight.  Ankle pain continues to improve.  No concerns when seen this morning. Discussed with her that TEE is recommended by ID and cardiology plans to try and perform this early next week.  Assessment and Plan:  CAP Acute respiratory failure with hypoxia - Patient presenting with shortness of breath.  Found to have chest x-ray consistent with right middle lobe pneumonia and associated right pleural effusion.  Saturating 78% on room air on arrival, improved to the 90s on 1 to 2 L - some possible contribution from volume overload as well - continue weaning O2; needs walk test daily if still on O2 - s/p rocephin and azithro on admission; d/c'd per ID  Staph aureus bacteremia - contacted by Dr. Moshe Cipro on 1/18; patient is growing 4/4 bottles of GPC which have speciated to SA so far (unknown still if MRSA vs MSSA) - given 2 g Ancef afternoon of 1/18 - since unclear if MRSA or not, will start Vanc for coverage  - repeat cultures obtained 08/07/22 - TTE 1/19 negative for veg - discussed with cardiology regarding TEE; unable to be performed until ~Tues based on availability  - ID following  Right ankle pain - patient developed worsening Right ankle pain starting 1/17 (in the ER) and this progressed  such that she could not ambulate enough to discharge home 1/18 - CT right ankle obtained to rule out septic joint; mostly shows arthritis changes - continue pain control and supportive care   Transaminitis - Patient noted to have significant transaminitis with AST of 720 and ALT of 338.  Also has had some recent diarrhea.   - negative hepatitis panel -Given downtrend since admission, suspect was elevated due to infection - LFTs improving   Thrombocytopenia -Also suspected due to infection -Platelets slowly improving - trend CBC   ESRD on HD - dialyzed on 1/17 on admission - will need ongoing HD while inpatient; nephrology following    IDDM - continue insulin pump and monitor CBGs   History of CVA - Continue home asa   Hypertension - Continue home amlodipine and labetalol   Old records reviewed in assessment of this patient  Antimicrobials: Rocephin 1/17 >> 1/19 Azithro 1/18 >> 1/19 Vanc 1/18 >> current   DVT prophylaxis:  SCDs Start: 08/06/22 1722   Code Status:   Code Status: Full Code  Mobility Assessment (last 72 hours)     Mobility Assessment     Row Name 08/09/22 0945 08/08/22 2232 08/08/22 1201 08/08/22 0755 08/07/22 2133   Does patient have an order for bedrest or is patient medically unstable No - Continue assessment No - Continue assessment No - Continue assessment No - Continue assessment No - Continue assessment   What is the highest level of mobility based on the progressive mobility assessment?  Level 5 (Walks with assist in room/hall) - Balance while stepping forward/back and can walk in room with assist - Complete Level 5 (Walks with assist in room/hall) - Balance while stepping forward/back and can walk in room with assist - Complete Level 5 (Walks with assist in room/hall) - Balance while stepping forward/back and can walk in room with assist - Complete -- Level 4 (Walks with assist in room) - Balance while marching in place and cannot step forward and  back - Complete   Is the above level different from baseline mobility prior to current illness? Yes - Recommend PT order Yes - Recommend PT order -- -- --    Floris Name 08/07/22 1704 08/07/22 0251         Does patient have an order for bedrest or is patient medically unstable -- No - Continue assessment      What is the highest level of mobility based on the progressive mobility assessment? Level 4 (Walks with assist in room) - Balance while marching in place and cannot step forward and back - Complete Level 2 (Chairfast) - Balance while sitting on edge of bed and cannot stand      Is the above level different from baseline mobility prior to current illness? -- Yes - Recommend PT order               Barriers to discharge: none Disposition Plan:  Home 2-3 days Status is: Inpatient  Objective: Blood pressure (!) 159/78, pulse 87, temperature (!) 97.4 F (36.3 C), temperature source Oral, resp. rate 16, height 5\' 3"  (1.6 m), weight 58.1 kg, SpO2 97 %.  Examination:  Physical Exam Constitutional:      General: She is not in acute distress.    Appearance: She is well-developed.  HENT:     Head: Normocephalic and atraumatic.     Mouth/Throat:     Mouth: Mucous membranes are moist.  Eyes:     Extraocular Movements: Extraocular movements intact.  Cardiovascular:     Rate and Rhythm: Normal rate and regular rhythm.     Heart sounds: Murmur heard.     Comments: 3/6 HSM noted throughout precordium Pulmonary:     Effort: Pulmonary effort is normal. No respiratory distress.     Breath sounds: Rhonchi present.  Abdominal:     General: Bowel sounds are normal. There is no distension.     Palpations: Abdomen is soft.     Tenderness: There is no abdominal tenderness.  Musculoskeletal:        General: No swelling.     Cervical back: Normal range of motion.     Comments: Improved TTP in right ankle; improved ROM  Skin:    General: Skin is warm and dry.  Neurological:     General: No  focal deficit present.     Mental Status: She is alert.  Psychiatric:        Mood and Affect: Mood normal.      Consultants:  Nephrology ID  Procedures:    Data Reviewed: Results for orders placed or performed during the hospital encounter of 08/06/22 (from the past 24 hour(s))  Glucose, capillary     Status: Abnormal   Collection Time: 08/08/22  4:17 PM  Result Value Ref Range   Glucose-Capillary 132 (H) 70 - 99 mg/dL   Comment 1 Notify RN    Comment 2 Document in Chart   Glucose, capillary     Status: Abnormal   Collection Time: 08/08/22  8:54 PM  Result Value Ref Range   Glucose-Capillary 120 (H) 70 - 99 mg/dL  CBC with Differential/Platelet     Status: Abnormal   Collection Time: 08/09/22  3:13 AM  Result Value Ref Range   WBC 8.0 4.0 - 10.5 K/uL   RBC 3.34 (L) 3.87 - 5.11 MIL/uL   Hemoglobin 9.2 (L) 12.0 - 15.0 g/dL   HCT 28.0 (L) 36.0 - 46.0 %   MCV 83.8 80.0 - 100.0 fL   MCH 27.5 26.0 - 34.0 pg   MCHC 32.9 30.0 - 36.0 g/dL   RDW 18.0 (H) 11.5 - 15.5 %   Platelets 109 (L) 150 - 400 K/uL   nRBC 0.4 (H) 0.0 - 0.2 %   Neutrophils Relative % 80 %   Neutro Abs 6.4 1.7 - 7.7 K/uL   Lymphocytes Relative 9 %   Lymphs Abs 0.7 0.7 - 4.0 K/uL   Monocytes Relative 9 %   Monocytes Absolute 0.8 0.1 - 1.0 K/uL   Eosinophils Relative 1 %   Eosinophils Absolute 0.1 0.0 - 0.5 K/uL   Basophils Relative 0 %   Basophils Absolute 0.0 0.0 - 0.1 K/uL   Immature Granulocytes 1 %   Abs Immature Granulocytes 0.04 0.00 - 0.07 K/uL  Magnesium     Status: None   Collection Time: 08/09/22  3:13 AM  Result Value Ref Range   Magnesium 2.3 1.7 - 2.4 mg/dL  Comprehensive metabolic panel     Status: Abnormal   Collection Time: 08/09/22  3:13 AM  Result Value Ref Range   Sodium 132 (L) 135 - 145 mmol/L   Potassium 3.7 3.5 - 5.1 mmol/L   Chloride 91 (L) 98 - 111 mmol/L   CO2 22 22 - 32 mmol/L   Glucose, Bld 173 (H) 70 - 99 mg/dL   BUN 49 (H) 6 - 20 mg/dL   Creatinine, Ser 8.41 (H)  0.44 - 1.00 mg/dL   Calcium 9.1 8.9 - 10.3 mg/dL   Total Protein 6.6 6.5 - 8.1 g/dL   Albumin 3.0 (L) 3.5 - 5.0 g/dL   AST 117 (H) 15 - 41 U/L   ALT 47 (H) 0 - 44 U/L   Alkaline Phosphatase 63 38 - 126 U/L   Total Bilirubin 0.6 0.3 - 1.2 mg/dL   GFR, Estimated 6 (L) >60 mL/min   Anion gap 19 (H) 5 - 15  Phosphorus     Status: Abnormal   Collection Time: 08/09/22  3:13 AM  Result Value Ref Range   Phosphorus 6.0 (H) 2.5 - 4.6 mg/dL  Glucose, capillary     Status: Abnormal   Collection Time: 08/09/22  7:31 AM  Result Value Ref Range   Glucose-Capillary 187 (H) 70 - 99 mg/dL    I have reviewed pertinent nursing notes, vitals, labs, and images as necessary. I have ordered labwork to follow up on as indicated.  I have reviewed the last notes from staff over past 24 hours. I have discussed patient's care plan and test results with nursing staff, CM/SW, and other staff as appropriate.  Time spent: Greater than 50% of the 55 minute visit was spent in counseling/coordination of care for the patient as laid out in the A&P.   LOS: 1 day   Dwyane Dee, MD [facility] 08/09/2022, 3:40 PM

## 2022-08-10 DIAGNOSIS — R7881 Bacteremia: Secondary | ICD-10-CM | POA: Diagnosis not present

## 2022-08-10 DIAGNOSIS — N186 End stage renal disease: Secondary | ICD-10-CM | POA: Diagnosis not present

## 2022-08-10 DIAGNOSIS — J189 Pneumonia, unspecified organism: Secondary | ICD-10-CM | POA: Diagnosis not present

## 2022-08-10 DIAGNOSIS — B9561 Methicillin susceptible Staphylococcus aureus infection as the cause of diseases classified elsewhere: Secondary | ICD-10-CM | POA: Diagnosis not present

## 2022-08-10 LAB — GLUCOSE, CAPILLARY
Glucose-Capillary: 292 mg/dL — ABNORMAL HIGH (ref 70–99)
Glucose-Capillary: 131 mg/dL — ABNORMAL HIGH (ref 70–99)
Glucose-Capillary: 79 mg/dL (ref 70–99)
Glucose-Capillary: 94 mg/dL (ref 70–99)
Glucose-Capillary: 187 mg/dL — ABNORMAL HIGH (ref 70–99)
Glucose-Capillary: 189 mg/dL — ABNORMAL HIGH (ref 70–99)

## 2022-08-10 NOTE — Plan of Care (Signed)
  Problem: Activity: Goal: Ability to tolerate increased activity will improve Outcome: Progressing   Problem: Clinical Measurements: Goal: Ability to maintain a body temperature in the normal range will improve Outcome: Progressing   Problem: Clinical Measurements: Goal: Will remain free from infection Outcome: Progressing

## 2022-08-10 NOTE — Progress Notes (Signed)
[facility] Progress Note   Subjective:   Patient seen and examined at bedside.  Feeling ok this AM.  Breathing better.  Denies CP, SOB, abdominal pain and n/v/d.  Tolerated dialysis well yesterday.   Objective Vitals:   08/09/22 2204 08/10/22 0500 08/10/22 0550 08/10/22 0801  BP: 118/70  128/70 139/73  Pulse: 88  83 80  Resp:    17  Temp: 99.2 F (37.3 C)  98 F (36.7 C) 98.2 F (36.8 C)  TempSrc: Oral  Oral Oral  SpO2: 90%  96% 99%  Weight:  56.2 kg    Height:       Physical Exam General:well appearing female in NAD Heart:RRR, no mrg Lungs:CTAB, nml WOB on RA Abdomen:soft, NTND Extremities:no LE edema Dialysis Access: RU AVG +b/t   Filed Weights   08/07/22 0259 08/09/22 0500 08/10/22 0500  Weight: 55 kg 58.1 kg 56.2 kg    Intake/Output Summary (Last 24 hours) at 08/10/2022 0847 Last data filed at 08/10/2022 0651 Gross per 24 hour  Intake 120.71 ml  Output 5200 ml  Net -5079.29 ml    Additional Objective Labs: Basic Metabolic Panel: Recent Labs  Lab 08/06/22 2025 08/07/22 0458 08/08/22 0339 08/09/22 0313  NA  --  134* 134* 132*  K  --  3.7 3.7 3.7  CL  --  92* 91* 91*  CO2  --  22 25 22   GLUCOSE  --  126* 154* 173*  BUN  --  17 39* 49*  CREATININE  --  4.67* 6.94* 8.41*  CALCIUM  --  9.0 8.9 9.1  PHOS 7.1*  --   --  6.0*   Liver Function Tests: Recent Labs  Lab 08/07/22 0458 08/08/22 0339 08/09/22 0313  AST 353* 160* 117*  ALT 266* 152* 47*  ALKPHOS 48 53 63  BILITOT 1.4* 1.0 0.6  PROT 6.6 6.3* 6.6  ALBUMIN 3.2* 3.0* 3.0*   Recent Labs  Lab 08/06/22 1154  LIPASE 24   CBC: Recent Labs  Lab 08/06/22 1154 08/07/22 0458 08/08/22 0339 08/09/22 0313  WBC 7.8 8.7 6.8 8.0  NEUTROABS 6.3  --  5.3 6.4  HGB 10.1* 8.8* 9.2* 9.2*  HCT 32.5* 28.3* 27.9* 28.0*  MCV 88.3 87.9 84.3 83.8  PLT 66* 87* 104* 109*   CBG: Recent Labs  Lab 08/09/22 0731 08/09/22 1933 08/09/22 2206 08/10/22 0256 08/10/22 0801  GLUCAP 187* 99  120* 292* 131*    Studies/Results: ECHOCARDIOGRAM COMPLETE  Result Date: 08/08/2022    ECHOCARDIOGRAM REPORT   Patient Name:   MARLYNE TOTARO Date of Exam: 08/08/2022 Medical Rec #:  [id number]         Height:       63.0 in Accession #:    [id number]        Weight:       121.3 lb Date of Birth:  1976/06/27        BSA:          1.563 m Patient Age:    18 years          BP:           107/61 mmHg Patient Gender: F                 HR:           76 bpm. Exam Location:  Inpatient Procedure: 2D Echo, Cardiac Doppler and Color Doppler Indications:    bacteremia  History:  Patient has prior history of Echocardiogram examinations. CHF;                 Risk Factors:Diabetes, Dyslipidemia and Hypertension.  Sonographer:    Phineas Douglas Referring Phys: [place]  1. Left ventricular ejection fraction, by estimation, is 55 to 60%. The left ventricle has normal function. The left ventricle has no regional wall motion abnormalities. There is mild left ventricular hypertrophy. Left ventricular diastolic parameters are consistent with Grade II diastolic dysfunction (pseudonormalization). Elevated left atrial pressure.  2. Right ventricular systolic function is normal. The right ventricular size is mildly enlarged. There is moderately elevated pulmonary artery systolic pressure. The estimated right ventricular systolic pressure is 16.1 mmHg.  3. Left atrial size was mildly dilated.  4. Right atrial size was mildly dilated.  5. The mitral valve is normal in structure. Trivial mitral valve regurgitation.  6. Tricuspid valve regurgitation is moderate.  7. The aortic valve is tricuspid. Aortic valve regurgitation is not visualized. Aortic valve sclerosis is present, with no evidence of aortic valve stenosis.  8. The inferior vena cava is dilated in size with <50% respiratory variability, suggesting right atrial pressure of 15 mmHg. Conclusion(s)/Recommendation(s): No evidence of valvular vegetations on  this transthoracic echocardiogram. Consider a transesophageal echocardiogram to exclude infective endocarditis if clinically indicated. FINDINGS  Left Ventricle: Left ventricular ejection fraction, by estimation, is 55 to 60%. The left ventricle has normal function. The left ventricle has no regional wall motion abnormalities. The left ventricular internal cavity size was normal in size. There is  mild left ventricular hypertrophy. Left ventricular diastolic parameters are consistent with Grade II diastolic dysfunction (pseudonormalization). Elevated left atrial pressure. Right Ventricle: The right ventricular size is mildly enlarged. Right vetricular wall thickness was not well visualized. Right ventricular systolic function is normal. There is moderately elevated pulmonary artery systolic pressure. The tricuspid regurgitant velocity is 3.33 m/s, and with an assumed right atrial pressure of 15 mmHg, the estimated right ventricular systolic pressure is 09.6 mmHg. Left Atrium: Left atrial size was mildly dilated. Right Atrium: Right atrial size was mildly dilated. Pericardium: Trivial pericardial effusion is present. Mitral Valve: The mitral valve is normal in structure. Trivial mitral valve regurgitation. Tricuspid Valve: The tricuspid valve is normal in structure. Tricuspid valve regurgitation is moderate. Aortic Valve: The aortic valve is tricuspid. Aortic valve regurgitation is not visualized. Aortic valve sclerosis is present, with no evidence of aortic valve stenosis. Pulmonic Valve: The pulmonic valve was not well visualized. Pulmonic valve regurgitation is trivial. Aorta: The aortic root and ascending aorta are structurally normal, with no evidence of dilitation. Venous: The inferior vena cava is dilated in size with less than 50% respiratory variability, suggesting right atrial pressure of 15 mmHg. IAS/Shunts: The interatrial septum was not well visualized.  LEFT VENTRICLE PLAX 2D LVIDd:         4.00 cm       Diastology LVIDs:         3.00 cm      LV e' medial:    6.13 cm/s LV PW:         1.20 cm      LV E/e' medial:  19.4 LV IVS:        1.20 cm      LV e' lateral:   9.20 cm/s LVOT diam:     1.60 cm      LV E/e' lateral: 12.9 LV SV:         53 LV  SV Index:   34 LVOT Area:     2.01 cm  LV Volumes (MOD) LV vol d, MOD A2C: 129.0 ml LV vol d, MOD A4C: 122.0 ml LV vol s, MOD A2C: 60.2 ml LV vol s, MOD A4C: 53.7 ml LV SV MOD A2C:     68.8 ml LV SV MOD A4C:     122.0 ml LV SV MOD BP:      72.0 ml RIGHT VENTRICLE             IVC RV Basal diam:  4.30 cm     IVC diam: 2.40 cm RV S prime:     13.70 cm/s TAPSE (M-mode): 1.6 cm LEFT ATRIUM             Index        RIGHT ATRIUM           Index LA diam:        3.80 cm 2.43 cm/m   RA Area:     18.70 cm LA Vol (A2C):   60.4 ml 38.64 ml/m  RA Volume:   56.70 ml  36.27 ml/m LA Vol (A4C):   59.8 ml 38.26 ml/m LA Biplane Vol: 60.1 ml 38.45 ml/m  AORTIC VALVE             PULMONIC VALVE LVOT Vmax:   129.00 cm/s PR End Diast Vel: 5.66 msec LVOT Vmean:  86.900 cm/s LVOT VTI:    0.262 m  AORTA Ao Root diam: 2.50 cm Ao Asc diam:  2.90 cm MITRAL VALVE                TRICUSPID VALVE MV Area (PHT): 4.17 cm     TR Peak grad:   44.4 mmHg MV Decel Time: 182 msec     TR Vmax:        333.00 cm/s MV E velocity: 119.00 cm/s MV A velocity: 91.80 cm/s   SHUNTS MV E/A ratio:  1.30         Systemic VTI:  0.26 m                             Systemic Diam: 1.60 cm Oswaldo Milian MD Electronically signed by Oswaldo Milian MD Signature Date/Time: 08/08/2022/4:25:03 PM    Final     Medications:   ceFAZolin (ANCEF) IV Stopped (08/09/22 1818)    acetaminophen  1,000 mg Oral Q6H   amLODipine  10 mg Oral Daily   aspirin EC  81 mg Oral Daily   Chlorhexidine Gluconate Cloth  6 each Topical Q0600   insulin pump   Subcutaneous TID WC, HS, 0200   labetalol  300 mg Oral Daily   sodium chloride flush  3 mL Intravenous Q12H    Dialysis Orders: Expand All Collapse All    [facility] Progress Note    Subjective:   Patient seen and examined at bedside.  Reports ankle feeling better with PT and tylenol.  Denies CP, SOB, abdominal pain and n/v/d. Not wearing oxygen currently, states "I keep forgetting to put it on."   Objective       Vitals:    08/08/22 1617 08/08/22 2050 08/09/22 0500 08/09/22 0505  BP: 134/65 132/73   (!) 146/73  Pulse: 77 83   95  Resp: 18        Temp: 97.9 F (36.6 C) 98.4 F (36.9 C)   99.6 F (37.6 C)  TempSrc:  Oral Oral   Oral  SpO2: 100% 99%   94%  Weight:     58.1 kg    Height:            Physical Exam General:well appearing female in NAD Heart:RRR, no mrg Lungs:BS decreased on R, nml WOB on RA Abdomen:soft, NTND Extremities:no LE edema Dialysis Access: RU AVG +b/t - no warmth, erythema or drainage         Filed Weights    08/07/22 0200 08/07/22 0259 08/09/22 0500  Weight: 55.2 kg 55 kg 58.1 kg      Intake/Output Summary (Last 24 hours) at 08/09/2022 0723 Last data filed at 08/08/2022 1545    Gross per 24 hour  Intake 356 ml  Output --  Net 356 ml      Additional Objective Labs: Basic Metabolic Panel: Last Labs        Recent Labs  Lab 08/06/22 2025 08/07/22 0458 08/08/22 0339 08/09/22 0313  NA  --  134* 134* 132*  K  --  3.7 3.7 3.7  CL  --  92* 91* 91*  CO2  --  22 25 22   GLUCOSE  --  126* 154* 173*  BUN  --  17 39* 49*  CREATININE  --  4.67* 6.94* 8.41*  CALCIUM  --  9.0 8.9 9.1  PHOS 7.1*  --   --  6.0*      Liver Function Tests: Last Labs       Recent Labs  Lab 08/07/22 0458 08/08/22 0339 08/09/22 0313  AST 353* 160* 117*  ALT 266* 152* 47*  ALKPHOS 48 53 63  BILITOT 1.4* 1.0 0.6  PROT 6.6 6.3* 6.6  ALBUMIN 3.2* 3.0* 3.0*      Last Labs     Recent Labs  Lab 08/06/22 1154  LIPASE 24      CBC: Last Labs        Recent Labs  Lab 08/06/22 1154 08/07/22 0458 08/08/22 0339 08/09/22 0313  WBC 7.8 8.7 6.8 8.0  NEUTROABS 6.3  --  5.3 6.4  HGB 10.1* 8.8* 9.2* 9.2*  HCT  32.5* 28.3* 27.9* 28.0*  MCV 88.3 87.9 84.3 83.8  PLT 66* 87* 104* 109*      Blood Culture Labs (Brief)           Component Value Date/Time    SDES BLOOD LEFT HAND 08/07/2022 1618    SPECREQUEST   08/07/2022 1618      BOTTLES DRAWN AEROBIC AND ANAEROBIC Blood Culture adequate volume    CULT   08/07/2022 1618      NO GROWTH < 24 HOURS Performed at [facility], [place] [address], [place], [place] [address]      REPTSTATUS PENDING 08/07/2022 1618      CBG: Last Labs         Recent Labs  Lab 08/08/22 0213 08/08/22 0805 08/08/22 1217 08/08/22 1617 08/08/22 2054  GLUCAP 176* 131* 99 132* 120*          Studies/Results:  Imaging Results (Last 48 hours)  ECHOCARDIOGRAM COMPLETE   Result Date: 08/08/2022    ECHOCARDIOGRAM REPORT   Patient Name:   CORETHA CRESWELL Date of Exam: 08/08/2022 Medical Rec #:  [id number]         Height:       63.0 in Accession #:    [id number]        Weight:       121.3 lb Date of Birth:  1975/09/05        BSA:          1.563 m Patient Age:    61 years          BP:           107/61 mmHg Patient Gender: F                 HR:           76 bpm. Exam Location:  Inpatient Procedure: 2D Echo, Cardiac Doppler and Color Doppler Indications:    bacteremia  History:        Patient has prior history of Echocardiogram examinations. CHF;                 Risk Factors:Diabetes, Dyslipidemia and Hypertension.  Sonographer:    Phineas Douglas Referring Phys: [place]  1. Left ventricular ejection fraction, by estimation, is 55 to 60%. The left ventricle has normal function. The left ventricle has no regional wall motion abnormalities. There is mild left ventricular hypertrophy. Left ventricular diastolic parameters are consistent with Grade II diastolic dysfunction (pseudonormalization). Elevated left atrial pressure.  2. Right ventricular systolic function is normal. The right ventricular size is mildly enlarged. There is moderately elevated pulmonary  artery systolic pressure. The estimated right ventricular systolic pressure is 25.0 mmHg.  3. Left atrial size was mildly dilated.  4. Right atrial size was mildly dilated.  5. The mitral valve is normal in structure. Trivial mitral valve regurgitation.  6. Tricuspid valve regurgitation is moderate.  7. The aortic valve is tricuspid. Aortic valve regurgitation is not visualized. Aortic valve sclerosis is present, with no evidence of aortic valve stenosis.  8. The inferior vena cava is dilated in size with <50% respiratory variability, suggesting right atrial pressure of 15 mmHg. Conclusion(s)/Recommendation(s): No evidence of valvular vegetations on this transthoracic echocardiogram. Consider a transesophageal echocardiogram to exclude infective endocarditis if clinically indicated. FINDINGS  Left Ventricle: Left ventricular ejection fraction, by estimation, is 55 to 60%. The left ventricle has normal function. The left ventricle has no regional wall motion abnormalities. The left ventricular internal cavity size was normal in size. There is  mild left ventricular hypertrophy. Left ventricular diastolic parameters are consistent with Grade II diastolic dysfunction (pseudonormalization). Elevated left atrial pressure. Right Ventricle: The right ventricular size is mildly enlarged. Right vetricular wall thickness was not well visualized. Right ventricular systolic function is normal. There is moderately elevated pulmonary artery systolic pressure. The tricuspid regurgitant velocity is 3.33 m/s, and with an assumed right atrial pressure of 15 mmHg, the estimated right ventricular systolic pressure is 03.7 mmHg. Left Atrium: Left atrial size was mildly dilated. Right Atrium: Right atrial size was mildly dilated. Pericardium: Trivial pericardial effusion is present. Mitral Valve: The mitral valve is normal in structure. Trivial mitral valve regurgitation. Tricuspid Valve: The tricuspid valve is normal in structure.  Tricuspid valve regurgitation is moderate. Aortic Valve: The aortic valve is tricuspid. Aortic valve regurgitation is not visualized. Aortic valve sclerosis is present, with no evidence of aortic valve stenosis. Pulmonic Valve: The pulmonic valve was not well visualized. Pulmonic valve regurgitation is trivial. Aorta: The aortic root and ascending aorta are structurally normal, with no evidence of dilitation. Venous: The inferior vena cava is dilated in size with less than 50% respiratory variability, suggesting right atrial pressure of 15 mmHg. IAS/Shunts: The interatrial septum was not well visualized.  LEFT VENTRICLE PLAX 2D LVIDd:  4.00 cm      Diastology LVIDs:         3.00 cm      LV e' medial:    6.13 cm/s LV PW:         1.20 cm      LV E/e' medial:  19.4 LV IVS:        1.20 cm      LV e' lateral:   9.20 cm/s LVOT diam:     1.60 cm      LV E/e' lateral: 12.9 LV SV:         53 LV SV Index:   34 LVOT Area:     2.01 cm  LV Volumes (MOD) LV vol d, MOD A2C: 129.0 ml LV vol d, MOD A4C: 122.0 ml LV vol s, MOD A2C: 60.2 ml LV vol s, MOD A4C: 53.7 ml LV SV MOD A2C:     68.8 ml LV SV MOD A4C:     122.0 ml LV SV MOD BP:      72.0 ml RIGHT VENTRICLE             IVC RV Basal diam:  4.30 cm     IVC diam: 2.40 cm RV S prime:     13.70 cm/s TAPSE (M-mode): 1.6 cm LEFT ATRIUM             Index        RIGHT ATRIUM           Index LA diam:        3.80 cm 2.43 cm/m   RA Area:     18.70 cm LA Vol (A2C):   60.4 ml 38.64 ml/m  RA Volume:   56.70 ml  36.27 ml/m LA Vol (A4C):   59.8 ml 38.26 ml/m LA Biplane Vol: 60.1 ml 38.45 ml/m  AORTIC VALVE             PULMONIC VALVE LVOT Vmax:   129.00 cm/s PR End Diast Vel: 5.66 msec LVOT Vmean:  86.900 cm/s LVOT VTI:    0.262 m  AORTA Ao Root diam: 2.50 cm Ao Asc diam:  2.90 cm MITRAL VALVE                TRICUSPID VALVE MV Area (PHT): 4.17 cm     TR Peak grad:   44.4 mmHg MV Decel Time: 182 msec     TR Vmax:        333.00 cm/s MV E velocity: 119.00 cm/s MV A velocity: 91.80  cm/s   SHUNTS MV E/A ratio:  1.30         Systemic VTI:  0.26 m                             Systemic Diam: 1.60 cm Oswaldo Milian MD Electronically signed by Oswaldo Milian MD Signature Date/Time: 08/08/2022/4:25:03 PM    Final     CT ANKLE RIGHT W CONTRAST   Result Date: 08/07/2022 CLINICAL DATA:  Bacteremia, right ankle pain EXAM: CT OF THE RIGHT ANKLE WITH CONTRAST TECHNIQUE: Multidetector CT imaging of the right ankle was performed following the standard protocol during bolus administration of intravenous contrast. RADIATION DOSE REDUCTION: This exam was performed according to the departmental dose-optimization program which includes automated exposure control, adjustment of the mA and/or kV according to patient size and/or use of iterative reconstruction technique. CONTRAST:  70mL OMNIPAQUE IOHEXOL 350 MG/ML SOLN COMPARISON:  None Available. FINDINGS: Bones/Joint/Cartilage No fracture or acute bony findings. There is some degenerative loss of articular space at the calcaneocuboid articulation. No tibiotalar joint effusion or substantial effusion of the distal tibiofibular articulation. No effacement of the adipose tissue in the sinus tarsi. No subtalar joint effusion is appreciated. Ligaments Suboptimally assessed by CT. Muscles and Tendons No significant tenosynovitis observed. Soft tissues Atherosclerosis noted.  Mild subcutaneous edema along the heel. IMPRESSION: 1. Mild degenerative loss of articular space at the calcaneocuboid articulation. 2. Mild subcutaneous edema along the heel. 3. Atherosclerosis. Electronically Signed   By: Van Clines M.D.   On: 08/07/2022 21:36       Medications:  anticoagulant sodium citrate     vancomycin       acetaminophen  1,000 mg Oral Q6H   amLODipine  10 mg Oral Daily   aspirin EC  81 mg Oral Daily   Chlorhexidine Gluconate Cloth  6 each Topical Q0600   heparin  2,000 Units Dialysis Once in dialysis   insulin pump   Subcutaneous TID WC, HS,  0200   labetalol  300 mg Oral Daily   sodium chloride flush  3 mL Intravenous Q12H      Dialysis Orders: [facility] TTS  425 / 800, 2K 2 Ca, EDW 58.4 (left at 57.8 on 1/16), 3:30, AVF UFP4  venofer 50 weekly, Mircera 50q2 (given 1/16) Heparin 2K Units hectorol 2 TIW, sensipar 30 TIW     Assessment/Plan: 1. Hypoxia/CAP/pleural effusion - Improved. ID suspects likely infection related.  Plan for home O2 as of now. 2. Staph aureus Bacteremia - BC at OP center + MSSA staph aureus. Recollected BC here.  ID following, plan for TEE Tuesday.  ABX changed to cefazolin.  3. R ankle pain - CT ankle with mild degenerative loss of articular space at calcaneocuboid articulation and mild subcutaneous edema along the heel.  Improving w/PT. Per PMD. 4. ESRD on dialysis since 09/2010 - on HD TTS.  Continue on schedule  5. Anemia of CKD-  Hgb 9.2. No indication for ESA, just given on 1/16.  6. Secondary hyperparathyroidism -  Calcium in goal.  Phos elevated but improving.  Continue VDRA, binders and sensipar for now 7. HTN/volume - BP well controlled.  Continue home meds. Under EDW if weights correct, will need lower dry, standing weight post HD if able.  8. H/o right sided pleural effusion + h/o CT in the past. Moderate R pleural effusion on CXR 08/06/22.  9. Nutrition - Renal diet w/fluid restrictions. Alb 3.4. +protein supplements. 10. DM since age 59 11. PAD    Jen Mow, PA-C [facility] 08/10/2022,8:47 AM  LOS: 2 days

## 2022-08-10 NOTE — Progress Notes (Signed)
Progress Note    [name]   VOZ:[id number]  DOB: 05/03/1976  DOA: 08/06/2022     2 PCP: Benito Mccreedy, MD  Initial CC: SOB  Hospital Course: [name] is a 47 yo female with PMH ESRD on HD, HTN, HLD, anxiety, DM II, CVA who presented with shortness of breath, weakness.  CXR on admission was concerning for right middle lobe opacity consistent with pneumonia.  She was started on Rocephin and azithromycin. After admission, we were informed that outpatient blood cultures from 08/05/2022 became positive for GPC, 4/4 bottles which have now speciated to MSSA. Cultures were obtained due to chills she developed during dialysis session.  Interval History:  No events overnight.  Ankle pain continues to improve. No other joint pains. Outpatient cultures speciated to MSSA.  She has no concerns this morning; breathing still good and no cough/sputum.  Understands she is awaiting TEE prior to being able to discharge.   Assessment and Plan:  CAP Acute respiratory failure with hypoxia - Patient presenting with shortness of breath.  Found to have chest x-ray consistent with right middle lobe pneumonia and associated right pleural effusion.  Saturating 78% on room air on arrival, improved to the 90s on 1 to 2 L - some possible contribution from volume overload as well - continue weaning O2; needs walk test daily if still on O2 - s/p rocephin and azithro on admission; d/c'd per ID  MSSA bacteremia - contacted by Dr. Moshe Cipro on 1/18; patient is growing 4/4 bottles of GPC which have speciated to MSSA - given 2 g Ancef afternoon of 1/18; then started on Vanc - repeat cultures obtained 08/07/22 remain negative - TTE 1/19 negative for veg -Treated with Vanc until cultures outpatient updated to MSSA - transitioned back to Ancef on 1/20 after cultures updated - ID following - discussed with cardiology regarding TEE; unable to be performed until ~Tues based on availability   Right ankle  pain - improving  - patient developed worsening Right ankle pain starting 1/17 (in the ER) and this progressed such that she could not ambulate enough to discharge home 1/18 - CT right ankle obtained to rule out septic joint; mostly shows arthritis changes - continue pain control and supportive care   Transaminitis - Patient noted to have significant transaminitis with AST of 720 and ALT of 338.  Also has had some recent diarrhea.   - negative hepatitis panel -Given downtrend since admission, suspect was elevated due to infection - LFTs improving   Thrombocytopenia -Also suspected due to infection -Platelets slowly improving - trend CBC   ESRD on HD - dialyzed on 1/17 on admission - will need ongoing HD while inpatient; nephrology following    IDDM - continue insulin pump and monitor CBGs   History of CVA - Continue home asa   Hypertension - Continue home amlodipine and labetalol   Old records reviewed in assessment of this patient  Antimicrobials: Rocephin 1/17 >> 1/19 Azithro 1/18 >> 1/19 Vanc 1/18 >> 1/20 Ancef 1/20 >> current    DVT prophylaxis:  SCDs Start: 08/06/22 1722   Code Status:   Code Status: Full Code  Mobility Assessment (last 72 hours)     Mobility Assessment     Row Name 08/09/22 1946 08/09/22 0945 08/08/22 2232 08/08/22 1201 08/08/22 0755   Does patient have an order for bedrest or is patient medically unstable No - Continue assessment No - Continue assessment No - Continue assessment No - Continue assessment No -  Continue assessment   What is the highest level of mobility based on the progressive mobility assessment? Level 5 (Walks with assist in room/hall) - Balance while stepping forward/back and can walk in room with assist - Complete Level 5 (Walks with assist in room/hall) - Balance while stepping forward/back and can walk in room with assist - Complete Level 5 (Walks with assist in room/hall) - Balance while stepping forward/back and can walk  in room with assist - Complete Level 5 (Walks with assist in room/hall) - Balance while stepping forward/back and can walk in room with assist - Complete --   Is the above level different from baseline mobility prior to current illness? Yes - Recommend PT order Yes - Recommend PT order Yes - Recommend PT order -- --    [name] Name 08/07/22 2133 08/07/22 1704         Does patient have an order for bedrest or is patient medically unstable No - Continue assessment --      What is the highest level of mobility based on the progressive mobility assessment? Level 4 (Walks with assist in room) - Balance while marching in place and cannot step forward and back - Complete Level 4 (Walks with assist in room) - Balance while marching in place and cannot step forward and back - Complete               Barriers to discharge: none Disposition Plan:  Home 2-3 days Status is: Inpatient  Objective: Blood pressure 139/73, pulse 80, temperature 98.2 F (36.8 C), temperature source Oral, resp. rate 17, height 5\' 3"  (1.6 m), weight 56.2 kg, SpO2 99 %.  Examination:  Physical Exam Constitutional:      General: She is not in acute distress.    Appearance: She is well-developed.  HENT:     Head: Normocephalic and atraumatic.     Mouth/Throat:     Mouth: Mucous membranes are moist.  Eyes:     Extraocular Movements: Extraocular movements intact.  Cardiovascular:     Rate and Rhythm: Normal rate and regular rhythm.     Heart sounds: Murmur heard.     Comments: 3/6 HSM noted throughout precordium Pulmonary:     Effort: Pulmonary effort is normal. No respiratory distress.     Breath sounds: Rhonchi present.  Abdominal:     General: Bowel sounds are normal. There is no distension.     Palpations: Abdomen is soft.     Tenderness: There is no abdominal tenderness.  Musculoskeletal:        General: No swelling.     Cervical back: Normal range of motion.     Comments: Improved TTP in right ankle; improved  ROM  Skin:    General: Skin is warm and dry.  Neurological:     General: No focal deficit present.     Mental Status: She is alert.  Psychiatric:        Mood and Affect: Mood normal.      Consultants:  Nephrology ID  Procedures:    Data Reviewed: Results for orders placed or performed during the hospital encounter of 08/06/22 (from the past 24 hour(s))  Glucose, capillary     Status: None   Collection Time: 08/09/22  7:33 PM  Result Value Ref Range   Glucose-Capillary 99 70 - 99 mg/dL  Glucose, capillary     Status: Abnormal   Collection Time: 08/09/22 10:06 PM  Result Value Ref Range   Glucose-Capillary 120 (H) 70 -  99 mg/dL  Glucose, capillary     Status: Abnormal   Collection Time: 08/10/22  2:56 AM  Result Value Ref Range   Glucose-Capillary 292 (H) 70 - 99 mg/dL  Glucose, capillary     Status: Abnormal   Collection Time: 08/10/22  8:01 AM  Result Value Ref Range   Glucose-Capillary 131 (H) 70 - 99 mg/dL  Glucose, capillary     Status: None   Collection Time: 08/10/22 11:55 AM  Result Value Ref Range   Glucose-Capillary 79 70 - 99 mg/dL    I have reviewed pertinent nursing notes, vitals, labs, and images as necessary. I have ordered labwork to follow up on as indicated.  I have reviewed the last notes from staff over past 24 hours. I have discussed patient's care plan and test results with nursing staff, CM/SW, and other staff as appropriate.  Time spent: Greater than 50% of the 55 minute visit was spent in counseling/coordination of care for the patient as laid out in the A&P.   LOS: 2 days   Dwyane Dee, MD [facility] 08/10/2022, 1:03 PM

## 2022-08-11 ENCOUNTER — Inpatient Hospital Stay (HOSPITAL_COMMUNITY): Payer: Medicare HMO

## 2022-08-11 DIAGNOSIS — R7881 Bacteremia: Secondary | ICD-10-CM | POA: Diagnosis not present

## 2022-08-11 DIAGNOSIS — T827XXA Infection and inflammatory reaction due to other cardiac and vascular devices, implants and grafts, initial encounter: Principal | ICD-10-CM

## 2022-08-11 DIAGNOSIS — B9561 Methicillin susceptible Staphylococcus aureus infection as the cause of diseases classified elsewhere: Secondary | ICD-10-CM | POA: Diagnosis not present

## 2022-08-11 DIAGNOSIS — N185 Chronic kidney disease, stage 5: Secondary | ICD-10-CM

## 2022-08-11 DIAGNOSIS — J9601 Acute respiratory failure with hypoxia: Secondary | ICD-10-CM | POA: Diagnosis not present

## 2022-08-11 DIAGNOSIS — T82898A Other specified complication of vascular prosthetic devices, implants and grafts, initial encounter: Secondary | ICD-10-CM

## 2022-08-11 DIAGNOSIS — J1520 Pneumonia due to staphylococcus, unspecified: Secondary | ICD-10-CM | POA: Diagnosis not present

## 2022-08-11 DIAGNOSIS — J189 Pneumonia, unspecified organism: Secondary | ICD-10-CM | POA: Diagnosis not present

## 2022-08-11 DIAGNOSIS — N186 End stage renal disease: Secondary | ICD-10-CM | POA: Diagnosis not present

## 2022-08-11 LAB — GLUCOSE, CAPILLARY
Glucose-Capillary: 163 mg/dL — ABNORMAL HIGH (ref 70–99)
Glucose-Capillary: 129 mg/dL — ABNORMAL HIGH (ref 70–99)
Glucose-Capillary: 83 mg/dL (ref 70–99)
Glucose-Capillary: 214 mg/dL — ABNORMAL HIGH (ref 70–99)
Glucose-Capillary: 205 mg/dL — ABNORMAL HIGH (ref 70–99)

## 2022-08-11 MED ORDER — CEFAZOLIN SODIUM-DEXTROSE 2-4 GM/100ML-% IV SOLN
2.0000 g | INTRAVENOUS | Status: DC
  Administered 2022-08-12 – 2022-08-14 (×2): 2 g via INTRAVENOUS
  Filled 2022-08-11 (×2): qty 100

## 2022-08-11 NOTE — Progress Notes (Signed)
[facility] Progress Note   Subjective:  Seen in room. No new complaints. Felt like blood sugar got low - was 83. Waiting to hear about TEE.   Objective Vitals:   08/10/22 2127 08/11/22 0500 08/11/22 0528 08/11/22 0725  BP: (!) 148/71  (!) 145/72 (!) 147/68  Pulse: 86  79 75  Resp:   19   Temp: 99.4 F (37.4 C)  98.7 F (37.1 C) 98.1 F (36.7 C)  TempSrc: Oral  Oral Oral  SpO2: 91%  100% 100%  Weight:  54 kg    Height:       Physical Exam General: well appearing female in NAD Heart: RRR, no mrg Lungs: CTAB, nml WOB on RA Abdomen: soft, NTND Extremities: no LE edema Dialysis Access: RU AVG +b/t   Filed Weights   08/09/22 0500 08/10/22 0500 08/11/22 0500  Weight: 58.1 kg 56.2 kg 54 kg    Intake/Output Summary (Last 24 hours) at 08/11/2022 1222 Last data filed at 08/10/2022 1330 Gross per 24 hour  Intake 180 ml  Output --  Net 180 ml     Additional Objective Labs: Basic Metabolic Panel: Recent Labs  Lab 08/06/22 2025 08/07/22 0458 08/08/22 0339 08/09/22 0313  NA  --  134* 134* 132*  K  --  3.7 3.7 3.7  CL  --  92* 91* 91*  CO2  --  22 25 22   GLUCOSE  --  126* 154* 173*  BUN  --  17 39* 49*  CREATININE  --  4.67* 6.94* 8.41*  CALCIUM  --  9.0 8.9 9.1  PHOS 7.1*  --   --  6.0*    Liver Function Tests: Recent Labs  Lab 08/07/22 0458 08/08/22 0339 08/09/22 0313  AST 353* 160* 117*  ALT 266* 152* 47*  ALKPHOS 48 53 63  BILITOT 1.4* 1.0 0.6  PROT 6.6 6.3* 6.6  ALBUMIN 3.2* 3.0* 3.0*    Recent Labs  Lab 08/06/22 1154  LIPASE 24    CBC: Recent Labs  Lab 08/06/22 1154 08/07/22 0458 08/08/22 0339 08/09/22 0313  WBC 7.8 8.7 6.8 8.0  NEUTROABS 6.3  --  5.3 6.4  HGB 10.1* 8.8* 9.2* 9.2*  HCT 32.5* 28.3* 27.9* 28.0*  MCV 88.3 87.9 84.3 83.8  PLT 66* 87* 104* 109*    CBG: Recent Labs  Lab 08/10/22 2128 08/10/22 2226 08/11/22 0242 08/11/22 0726 08/11/22 1139  GLUCAP 187* 189* 163* 129* 83     Studies/Results: No  results found.  Medications:  [START ON 08/12/2022]  ceFAZolin (ANCEF) IV      acetaminophen  1,000 mg Oral Q6H   amLODipine  10 mg Oral Daily   aspirin EC  81 mg Oral Daily   Chlorhexidine Gluconate Cloth  6 each Topical Q0600   insulin pump   Subcutaneous TID WC, HS, 0200   labetalol  300 mg Oral Daily   sodium chloride flush  3 mL Intravenous Q12H      Dialysis Orders: [facility] TTS  425 / 800, 2K 2 Ca, EDW 58.4 (left at 57.8 on 1/16), 3:30, AVF UFP4  venofer 50 weekly, Mircera 50q2 (given 1/16) Heparin 2K Units hectorol 2 TIW, sensipar 30 TIW     Assessment/Plan: 1. Hypoxia/CAP/pleural effusion - Improved. ID suspects likely infection related.  Plan for home O2 as of now. 2. Staph aureus Bacteremia - BC at OP center + MSSA staph aureus. Recollected BC here.  ID following, plan for TEE Tuesday.  ABX changed  to cefazolin.  3. R ankle pain - CT ankle with mild degenerative loss of articular space at calcaneocuboid articulation and mild subcutaneous edema along the heel.  Improving w/PT. Per PMD. 4. ESRD on dialysis since 09/2010 - on HD TTS.  Continue on schedule  5. Anemia of CKD-  Hgb 9.2. No indication for ESA, just given on 1/16.  6. Secondary hyperparathyroidism -  Calcium in goal.  Phos elevated but improving.  Continue VDRA, binders and sensipar for now 7. HTN/volume - BP well controlled.  Continue home meds. Under EDW if weights correct, will need lower dry, standing weight post HD if able.  8. H/o right sided pleural effusion + h/o CT in the past. Moderate R pleural effusion on CXR 08/06/22.  9. Nutrition - Renal diet w/fluid restrictions. Alb 3.4. +protein supplements. 10. DM since age 54 11. PAD    Lynnda Child PA-C [facility] 08/11/2022,12:24 PM

## 2022-08-11 NOTE — Progress Notes (Signed)
1/22 Upon giving the patient a copy of the IMM Letter, the patient says that she doesn't have a Representative. The signature on the document is that of her 45 year old daughter. The daughter says that she was given the document to sign without reasonable knowledge of what she was signing. The patient displayed disdain and exasperation in regard to the event.

## 2022-08-11 NOTE — Care Management Important Message (Signed)
Important Message  Patient Details  Name: [name] MRN: [id number] Date of Birth: 15-Mar-1976   Medicare Important Message Given:  Other (see comment)     Hannah Beat 08/11/2022, 4:05 PM

## 2022-08-11 NOTE — Progress Notes (Signed)
[facility] Infectious Diseases Follow Up Note  Patient Identification: Patient Name: LISANDRA MATHISEN MRN: [id number] [place] Date: 08/06/2022 11:20 AM Age: 48 y.o.Today's Date: 08/11/2022  Reason for Visit: MSSA bacteremia  Active Problems:   Essential hypertension, benign   DM (diabetes mellitus) ([place])   ESRD on hemodialysis ([place])   History of CVA (cerebrovascular accident)   Staphylococcus aureus bacteremia   Pneumonia, staphylococcal ([place])  Antibiotics:  Doxycycline 1/17, Azithromycin 1/18-1/19 Ceftriaxone 1/17- 1/19 Cefazolin 1/18, cefazolin 1/20   Lines/Hardware: Rt UE AV graft , left UE AV graft ( not working)  Interval Events: remains afebile   Assessment 47 year old female with PMH as below including ESRD on HD via rt  upper extremity AV graft, type I DM, HTN, streptococcus vestibularis bacteremia in 01/2020( treated with 2 weeks of IV cefazolin)  to the ED on 1/17 with shortness of breath/?cough . Admitted with:    # MSSA bacteremia  # Acute hypoxic respiratory failure 2/2 pna, likely staph aureus related, moderate rt pleural effusion - resolved, back to RA with no respiratory complaints.   # Transaminitis - in the setting of sepsis, improving, 1/17 acute hepatitis panel negative  # Thrombocytopenia - in the setting of sepsis, improving  Recommendations Continue cefazolin.  TEE planned on 1/26 Fu repeat blood cx 1/18 ( NG in 4 days) I will order [place] of left UE AV graft given swelling/pain/tenderness on exam as well as panorex ( complains of left sided dental pain and thinks source of infection Engage Vascular surgery for evaluation of left UA AVG, secure chatted primary Monitor for metastatic sites of infection Following  Rest of the management as per the primary team. Thank you for the consult. Please page with pertinent questions or  concerns.  ______________________________________________________________________ Subjective patient seen and examined at the bedside. Complains of swelling and pain in the left AVG where she tells they were measuring her BP through a BP cuff and likely resulted in swelling. No complains in the RT AVG. Denies respiratory or GI complaints. She is upset that she has to stay in the hospital until Friday   Vitals BP (!) 147/68 (BP Location: Left Wrist)   Pulse 75   Temp 98.1 F (36.7 C) (Oral)   Resp 19   Ht 5\' 3"  (1.6 m)   Wt 54 kg   SpO2 100%   BMI 21.09 kg/m     Physical Exam Constitutional:  sitting up in the bed and appears comfortable     Comments:   Cardiovascular:     Rate and Rhythm: Normal rate and regular rhythm.     Heart sounds:  Pulmonary:     Effort: Pulmonary effort is normal on room air     Comments:   Abdominal:     Palpations: Abdomen is soft.     Tenderness: non distended and non tender   Musculoskeletal:        General: No swelling or tenderness in peripheral joints, No spinal tenderness  Skin:    Comments: No rashes. Rt UE AVG with no swelling/warmth and tenderness, left UE AVG with swelling, warmth, redness and tenderness, no fluctuance   Neurological:     General: awake, alert and oriented, grossly non focal   Psychiatric:        Mood and Affect: Mood normal.   Pertinent Microbiology Results for orders placed or performed during the hospital encounter of 08/06/22  Resp panel by RT-PCR (RSV, Flu A&B, Covid) Anterior Nasal Swab     Status:  None   Collection Time: 08/06/22 11:50 AM   Specimen: Anterior Nasal Swab  Result Value Ref Range Status   SARS Coronavirus 2 by RT PCR NEGATIVE NEGATIVE Final    Comment: (NOTE) SARS-CoV-2 target nucleic acids are NOT DETECTED.  The SARS-CoV-2 RNA is generally detectable in upper respiratory specimens during the acute phase of infection. The lowest concentration of SARS-CoV-2 viral copies this assay can  detect is 138 copies/mL. A negative result does not preclude SARS-Cov-2 infection and should not be used as the sole basis for treatment or other patient management decisions. A negative result may occur with  improper specimen collection/handling, submission of specimen other than nasopharyngeal swab, presence of viral mutation(s) within the areas targeted by this assay, and inadequate number of viral copies(<138 copies/mL). A negative result must be combined with clinical observations, patient history, and epidemiological information. The expected result is Negative.  Fact Sheet for Patients:  EntrepreneurPulse.com.au  Fact Sheet for Healthcare Providers:  IncredibleEmployment.be  This test is no t yet approved or cleared by the [place] FDA and  has been authorized for detection and/or diagnosis of SARS-CoV-2 by FDA under an Emergency Use Authorization (EUA). This EUA will remain  in effect (meaning this test can be used) for the duration of the COVID-19 declaration under Section 564(b)(1) of the Act, 21 U.S.C.section 360bbb-3(b)(1), unless the authorization is terminated  or revoked sooner.       Influenza A by PCR NEGATIVE NEGATIVE Final   Influenza B by PCR NEGATIVE NEGATIVE Final    Comment: (NOTE) The Xpert Xpress SARS-CoV-2/FLU/RSV plus assay is intended as an aid in the diagnosis of influenza from Nasopharyngeal swab specimens and should not be used as a sole basis for treatment. Nasal washings and aspirates are unacceptable for Xpert Xpress SARS-CoV-2/FLU/RSV testing.  Fact Sheet for Patients: EntrepreneurPulse.com.au  Fact Sheet for Healthcare Providers: IncredibleEmployment.be  This test is not yet approved or cleared by the [place] FDA and has been authorized for detection and/or diagnosis of SARS-CoV-2 by FDA under an Emergency Use Authorization (EUA). This EUA will remain in  effect (meaning this test can be used) for the duration of the COVID-19 declaration under Section 564(b)(1) of the Act, 21 U.S.C. section 360bbb-3(b)(1), unless the authorization is terminated or revoked.     Resp Syncytial Virus by PCR NEGATIVE NEGATIVE Final    Comment: (NOTE) Fact Sheet for Patients: EntrepreneurPulse.com.au  Fact Sheet for Healthcare Providers: IncredibleEmployment.be  This test is not yet approved or cleared by the [place] FDA and has been authorized for detection and/or diagnosis of SARS-CoV-2 by FDA under an Emergency Use Authorization (EUA). This EUA will remain in effect (meaning this test can be used) for the duration of the COVID-19 declaration under Section 564(b)(1) of the Act, 21 U.S.C. section 360bbb-3(b)(1), unless the authorization is terminated or revoked.  Performed at [facility], [place] [address] [place], [place] [address]   Respiratory (~20 pathogens) panel by PCR     Status: None   Collection Time: 08/06/22 11:50 AM   Specimen: Nasopharyngeal Swab; Respiratory  Result Value Ref Range Status   Adenovirus NOT DETECTED NOT DETECTED Final   Coronavirus 229E NOT DETECTED NOT DETECTED Final    Comment: (NOTE) The Coronavirus on the Respiratory Panel, DOES NOT test for the novel  Coronavirus (2019 nCoV)    Coronavirus HKU1 NOT DETECTED NOT DETECTED Final   Coronavirus NL63 NOT DETECTED NOT DETECTED Final   Coronavirus OC43 NOT DETECTED NOT DETECTED  Final   Metapneumovirus NOT DETECTED NOT DETECTED Final   Rhinovirus / Enterovirus NOT DETECTED NOT DETECTED Final   Influenza A NOT DETECTED NOT DETECTED Final   Influenza B NOT DETECTED NOT DETECTED Final   Parainfluenza Virus 1 NOT DETECTED NOT DETECTED Final   Parainfluenza Virus 2 NOT DETECTED NOT DETECTED Final   Parainfluenza Virus 3 NOT DETECTED NOT DETECTED Final   Parainfluenza Virus 4 NOT DETECTED NOT DETECTED Final   Respiratory  Syncytial Virus NOT DETECTED NOT DETECTED Final   Bordetella pertussis NOT DETECTED NOT DETECTED Final   Bordetella Parapertussis NOT DETECTED NOT DETECTED Final   Chlamydophila pneumoniae NOT DETECTED NOT DETECTED Final   Mycoplasma pneumoniae NOT DETECTED NOT DETECTED Final    Comment: Performed at [facility], [place] [address], [place], [place] [address]  MRSA Next Gen by PCR, Nasal     Status: None   Collection Time: 08/07/22  2:59 AM   Specimen: Nasal Mucosa; Nasal Swab  Result Value Ref Range Status   MRSA by PCR Next Gen NOT DETECTED NOT DETECTED Final    Comment: (NOTE) The GeneXpert MRSA Assay (FDA approved for NASAL specimens only), is one component of a comprehensive MRSA colonization surveillance program. It is not intended to diagnose MRSA infection nor to guide or monitor treatment for MRSA infections. Test performance is not FDA approved in patients less than 72 years old. Performed at [facility], [place] [address] [place], [place] [address]   Culture, blood (Routine X 2) w Reflex to ID Panel     Status: None (Preliminary result)   Collection Time: 08/07/22  4:17 PM   Specimen: BLOOD LEFT FOREARM  Result Value Ref Range Status   Specimen Description BLOOD LEFT FOREARM  Final   Special Requests   Final    BOTTLES DRAWN AEROBIC ONLY Blood Culture adequate volume   Culture   Final    NO GROWTH 4 DAYS Performed at [facility], [place] [address], [place], [place] [address]    Report Status PENDING  Incomplete  Culture, blood (Routine X 2) w Reflex to ID Panel     Status: None (Preliminary result)   Collection Time: 08/07/22  4:18 PM   Specimen: BLOOD LEFT HAND  Result Value Ref Range Status   Specimen Description BLOOD LEFT HAND  Final   Special Requests   Final    BOTTLES DRAWN AEROBIC AND ANAEROBIC Blood Culture adequate volume   Culture   Final    NO GROWTH 4 DAYS Performed at [facility], [place] [address] [place], [place] [address]     Report Status PENDING  Incomplete   Pertinent Lab.    Latest Ref Rng & Units 08/09/2022    3:13 AM 08/08/2022    3:39 AM 08/07/2022    4:58 AM  CBC  WBC 4.0 - 10.5 K/uL 8.0  6.8  8.7   Hemoglobin 12.0 - 15.0 g/dL 9.2  9.2  8.8   Hematocrit 36.0 - 46.0 % 28.0  27.9  28.3   Platelets 150 - 400 K/uL 109  104  87       Latest Ref Rng & Units 08/09/2022    3:13 AM 08/08/2022    3:39 AM 08/07/2022    4:58 AM  CMP  Glucose 70 - 99 mg/dL 173  154  126   BUN 6 - 20 mg/dL 49  39  17   Creatinine 0.44 - 1.00 mg/dL 8.41  6.94  4.67   Sodium 135 - 145 mmol/L 132  134  134   Potassium 3.5 - 5.1 mmol/L 3.7  3.7  3.7   Chloride 98 - 111 mmol/L 91  91  92   CO2 22 - 32 mmol/L 22  25  22    Calcium 8.9 - 10.3 mg/dL 9.1  8.9  9.0   Total Protein 6.5 - 8.1 g/dL 6.6  6.3  6.6   Total Bilirubin 0.3 - 1.2 mg/dL 0.6  1.0  1.4   Alkaline Phos 38 - 126 U/L 63  53  48   AST 15 - 41 U/L 117  160  353   ALT 0 - 44 U/L 47  152  266     Pertinent Imaging today Plain films and CT images have been personally visualized and interpreted; radiology reports have been reviewed. Decision making incorporated into the Impression / Recommendations.  ECHOCARDIOGRAM COMPLETE  Result Date: 08/08/2022    ECHOCARDIOGRAM REPORT   Patient Name:   RHONA FUSILIER Date of Exam: 08/08/2022 Medical Rec #:  [id number]         Height:       63.0 in Accession #:    [id number]        Weight:       121.3 lb Date of Birth:  February 23, 1976        BSA:          1.563 m Patient Age:    88 years          BP:           107/61 mmHg Patient Gender: F                 HR:           76 bpm. Exam Location:  Inpatient Procedure: 2D Echo, Cardiac Doppler and Color Doppler Indications:    bacteremia  History:        Patient has prior history of Echocardiogram examinations. CHF;                 Risk Factors:Diabetes, Dyslipidemia and Hypertension.  Sonographer:    Phineas Douglas Referring Phys: [place]  1. Left ventricular ejection  fraction, by estimation, is 55 to 60%. The left ventricle has normal function. The left ventricle has no regional wall motion abnormalities. There is mild left ventricular hypertrophy. Left ventricular diastolic parameters are consistent with Grade II diastolic dysfunction (pseudonormalization). Elevated left atrial pressure.  2. Right ventricular systolic function is normal. The right ventricular size is mildly enlarged. There is moderately elevated pulmonary artery systolic pressure. The estimated right ventricular systolic pressure is 34.7 mmHg.  3. Left atrial size was mildly dilated.  4. Right atrial size was mildly dilated.  5. The mitral valve is normal in structure. Trivial mitral valve regurgitation.  6. Tricuspid valve regurgitation is moderate.  7. The aortic valve is tricuspid. Aortic valve regurgitation is not visualized. Aortic valve sclerosis is present, with no evidence of aortic valve stenosis.  8. The inferior vena cava is dilated in size with <50% respiratory variability, suggesting right atrial pressure of 15 mmHg. Conclusion(s)/Recommendation(s): No evidence of valvular vegetations on this transthoracic echocardiogram. Consider a transesophageal echocardiogram to exclude infective endocarditis if clinically indicated. FINDINGS  Left Ventricle: Left ventricular ejection fraction, by estimation, is 55 to 60%. The left ventricle has normal function. The left ventricle has no regional wall motion abnormalities. The left ventricular internal cavity size was  normal in size. There is  mild left ventricular hypertrophy. Left ventricular diastolic parameters are consistent with Grade II diastolic dysfunction (pseudonormalization). Elevated left atrial pressure. Right Ventricle: The right ventricular size is mildly enlarged. Right vetricular wall thickness was not well visualized. Right ventricular systolic function is normal. There is moderately elevated pulmonary artery systolic pressure. The tricuspid  regurgitant velocity is 3.33 m/s, and with an assumed right atrial pressure of 15 mmHg, the estimated right ventricular systolic pressure is 24.2 mmHg. Left Atrium: Left atrial size was mildly dilated. Right Atrium: Right atrial size was mildly dilated. Pericardium: Trivial pericardial effusion is present. Mitral Valve: The mitral valve is normal in structure. Trivial mitral valve regurgitation. Tricuspid Valve: The tricuspid valve is normal in structure. Tricuspid valve regurgitation is moderate. Aortic Valve: The aortic valve is tricuspid. Aortic valve regurgitation is not visualized. Aortic valve sclerosis is present, with no evidence of aortic valve stenosis. Pulmonic Valve: The pulmonic valve was not well visualized. Pulmonic valve regurgitation is trivial. Aorta: The aortic root and ascending aorta are structurally normal, with no evidence of dilitation. Venous: The inferior vena cava is dilated in size with less than 50% respiratory variability, suggesting right atrial pressure of 15 mmHg. IAS/Shunts: The interatrial septum was not well visualized.  LEFT VENTRICLE PLAX 2D LVIDd:         4.00 cm      Diastology LVIDs:         3.00 cm      LV e' medial:    6.13 cm/s LV PW:         1.20 cm      LV E/e' medial:  19.4 LV IVS:        1.20 cm      LV e' lateral:   9.20 cm/s LVOT diam:     1.60 cm      LV E/e' lateral: 12.9 LV SV:         53 LV SV Index:   34 LVOT Area:     2.01 cm  LV Volumes (MOD) LV vol d, MOD A2C: 129.0 ml LV vol d, MOD A4C: 122.0 ml LV vol s, MOD A2C: 60.2 ml LV vol s, MOD A4C: 53.7 ml LV SV MOD A2C:     68.8 ml LV SV MOD A4C:     122.0 ml LV SV MOD BP:      72.0 ml RIGHT VENTRICLE             IVC RV Basal diam:  4.30 cm     IVC diam: 2.40 cm RV S prime:     13.70 cm/s TAPSE (M-mode): 1.6 cm LEFT ATRIUM             Index        RIGHT ATRIUM           Index LA diam:        3.80 cm 2.43 cm/m   RA Area:     18.70 cm LA Vol (A2C):   60.4 ml 38.64 ml/m  RA Volume:   56.70 ml  36.27 ml/m LA Vol  (A4C):   59.8 ml 38.26 ml/m LA Biplane Vol: 60.1 ml 38.45 ml/m  AORTIC VALVE             PULMONIC VALVE LVOT Vmax:   129.00 cm/s PR End Diast Vel: 5.66 msec LVOT Vmean:  86.900 cm/s LVOT VTI:    0.262 m  AORTA Ao Root diam: 2.50 cm Ao Asc diam:  2.90 cm MITRAL VALVE                TRICUSPID VALVE MV Area (PHT): 4.17 cm     TR Peak grad:   44.4 mmHg MV Decel Time: 182 msec     TR Vmax:        333.00 cm/s MV E velocity: 119.00 cm/s MV A velocity: 91.80 cm/s   SHUNTS MV E/A ratio:  1.30         Systemic VTI:  0.26 m                             Systemic Diam: 1.60 cm Oswaldo Milian MD Electronically signed by Oswaldo Milian MD Signature Date/Time: 08/08/2022/4:25:03 PM    Final    CT ANKLE RIGHT W CONTRAST  Result Date: 08/07/2022 CLINICAL DATA:  Bacteremia, right ankle pain EXAM: CT OF THE RIGHT ANKLE WITH CONTRAST TECHNIQUE: Multidetector CT imaging of the right ankle was performed following the standard protocol during bolus administration of intravenous contrast. RADIATION DOSE REDUCTION: This exam was performed according to the departmental dose-optimization program which includes automated exposure control, adjustment of the mA and/or kV according to patient size and/or use of iterative reconstruction technique. CONTRAST:  27mL OMNIPAQUE IOHEXOL 350 MG/ML SOLN COMPARISON:  None Available. FINDINGS: Bones/Joint/Cartilage No fracture or acute bony findings. There is some degenerative loss of articular space at the calcaneocuboid articulation. No tibiotalar joint effusion or substantial effusion of the distal tibiofibular articulation. No effacement of the adipose tissue in the sinus tarsi. No subtalar joint effusion is appreciated. Ligaments Suboptimally assessed by CT. Muscles and Tendons No significant tenosynovitis observed. Soft tissues Atherosclerosis noted.  Mild subcutaneous edema along the heel. IMPRESSION: 1. Mild degenerative loss of articular space at the calcaneocuboid articulation. 2.  Mild subcutaneous edema along the heel. 3. Atherosclerosis. Electronically Signed   By: Van Clines M.D.   On: 08/07/2022 21:36   DG Chest 2 View  Result Date: 08/06/2022 CLINICAL DATA:  Shortness of breath EXAM: CHEST - 2 VIEW COMPARISON:  Chest x-ray March 08, 2021 FINDINGS: Unchanged cardiomegaly. Stable mediastinal contours. Confluent opacity in the right middle lobe. Moderate right pleural effusion. No focal opacity in the left lung. No large pneumothorax. The visualized upper abdomen is unremarkable. No acute osseous abnormality. IMPRESSION: 1. Right middle lobe confluent opacity, concerning for pneumonia. Recommend treatment of acute illness and repeat chest radiograph in 6-8 weeks to ensure resolution and exclude underlying malignancy. 2. Moderate right pleural effusion. Electronically Signed   By: Beryle Flock M.D.   On: 08/06/2022 13:22    I spent 51 minutes for this patient encounter including review of prior medical records, coordination of care with primary/other specialist with greater than 50% of time being face to face/counseling and discussing diagnostics/treatment plan with the patient/family.  Electronically signed by:   Rosiland Oz, MD Infectious Disease Physician [facility] Infectious Disease Pager: [phone]

## 2022-08-11 NOTE — H&P (View-Only) (Signed)
CONSULT NOTE   MRN : [id number]  Reason for Consult: Left UE non functioning AV graft with erythema and edema possible infection Referring Physician: Dr. Sabino Gasser IM  History of Present Illness: 47 y/o female with long history of ESRD.  She had a previous left UE AV graft that has not been functional for > 10 years. She states that yesterday a blood pressure cuff was used over the left UE and that is when the edema and erythema occurred.    She presented with a CC of SOB, weakness, and a chest x ray demonstrated positive for PNA.  She has positive blood cultures MSSA bacteremia and ID is following.   She is currently on Ancef IV.  Antibiotics:  Doxycycline 1/17, Azithromycin 1/18-1/19 Ceftriaxone 1/17- 1/19 Cefazolin 1/18, cefazolin 1/20   Past medical history: HTN, HLD, anxiety, DM II, CVA         Current Facility-Administered Medications  Medication Dose Route Frequency Provider Last Rate Last Admin   acetaminophen (TYLENOL) tablet 1,000 mg  1,000 mg Oral Q6H Dwyane Dee, MD   1,000 mg at 08/09/22 1809   amLODipine (NORVASC) tablet 10 mg  10 mg Oral Daily Marcelyn Bruins, MD   10 mg at 08/11/22 0935   aspirin EC tablet 81 mg  81 mg Oral Daily Marcelyn Bruins, MD   81 mg at 08/07/22 1001   [START ON 08/12/2022] ceFAZolin (ANCEF) IVPB 2g/100 mL premix  2 g Intravenous Q Florina Ou, MD       Chlorhexidine Gluconate Cloth 2 % PADS 6 each  6 each Topical Q0600 Dwana Melena, MD   6 each at 08/11/22 0647   insulin pump   Subcutaneous TID WC, HS, 0200 Marcelyn Bruins, MD   Given at 08/11/22 0741   labetalol (NORMODYNE) tablet 300 mg  300 mg Oral Daily Marcelyn Bruins, MD   300 mg at 08/11/22 0935   polyethylene glycol (MIRALAX / GLYCOLAX) packet 17 g  17 g Oral Daily PRN Marcelyn Bruins, MD       sodium chloride flush (NS) 0.9 % injection 3 mL  3 mL Intravenous Q12H Marcelyn Bruins, MD   3 mL at 08/11/22 0936    Pt meds include: Statin  :Yes Betablocker: No ASA: Yes Other anticoagulants/antiplatelets: none  Past Medical History:  Diagnosis Date   Anemia    Arthritis    Ascites 03/28/2020   Bacteremia 02/11/2020   Cerebral infarction due to unspecified mechanism 05/22/2014   CHF (congestive heart failure) (HCC)    Diabetes mellitus    Diabetic retinopathy ([place])    Hx of laser Rx's   DM type 1 (diabetes mellitus, type 1) ([place]) 09/12/2014   Age of onset for DM type 1 was age 58.      Enlarged thyroid gland    ESRD on hemodialysis ([place])    ESRD due to DM type I, age of onset DM 1 was age 19.  Went on dialysis in March 2012.  Gets HD now at [facility] on a TTS schedule.     ESRD on hemodialysis ([place]) 09/12/2014   ESRD due to DM type 1.  Started HD in 2012 at [facility].  Gets HD there on a TTS schedule.     GERD (gastroesophageal reflux disease)    HCAP (healthcare-associated pneumonia) 09/11/2014   Hemorrhage from arteriovenous dialysis graft ([place]) 10/20/2019   History of blood transfusion    Hyperlipidemia  Hypertension    Hypertensive emergency 05/22/2014   Peripheral vascular disease ([place])    Pneumonia     Past Surgical History:  Procedure Laterality Date   A/V FISTULAGRAM Right 10/31/2020   Procedure: A/V FISTULAGRAM;  Surgeon: Cherre Robins, MD;  Location: [facility];  Service: Cardiovascular;  Laterality: Right;   ARTERIOVENOUS GRAFT PLACEMENT     ARTERY REPAIR Left 12/10/2012   Procedure: BRACHIAL ARTERY REPAIR;  Surgeon: Angelia Mould, MD;  Location: [facility];  Service: Vascular;  Laterality: Left;  Exploration Left Brachial Artery for AVF   AV FISTULA PLACEMENT Right 01/11/2018   Procedure: INSERTION OF ARTERIOVENOUS (AV) GORE-TEX GRAFT  UPPER ARM;  Surgeon: Angelia Mould, MD;  Location: [facility];  Service: Vascular;  Laterality: Right;   [place] [facility] [place] Right 09/28/2017   Procedure: FIRST STAGE BASILIC VEIN TRANSPOSITION;  Surgeon: Angelia Mould, MD;   Location: [facility];  Service: Vascular;  Laterality: Right;   CESAREAN SECTION     CHEST TUBE INSERTION Right 06/01/2020   Procedure: CHEST TUBE INSERTION;  Surgeon: Garner Nash, DO;  Location: [facility];  Service: Pulmonary;  Laterality: Right;   CHEST TUBE INSERTION Right 07/04/2020   Procedure: INSERTION PLEURAL DRAINAGE CATHETER;  Surgeon: Melrose Nakayama, MD;  Location: [facility];  Service: Thoracic;  Laterality: Right;   EYE SURGERY     [facility]   EYE SURGERY Left    IR THORACENTESIS ASP PLEURAL SPACE W/IMG GUIDE  05/31/2020   IR THORACENTESIS ASP PLEURAL SPACE W/IMG GUIDE  06/22/2020   REVISION OF ARTERIOVENOUS GORETEX GRAFT Left 08/01/2016   Procedure: REVISION OF ARTERIOVENOUS GORETEX GRAFT;  Surgeon: Angelia Mould, MD;  Location: [facility];  Service: Vascular;  Laterality: Left;   REVISION OF ARTERIOVENOUS GORETEX GRAFT Right 10/21/2019   Procedure: REVISION OF ARTERIOVENOUS GORETEX GRAFT ARM;  Surgeon: Rosetta Posner, MD;  Location: [facility];  Service: Vascular;  Laterality: Right;   RIGHT HEART CATH N/A 08/01/2020   Procedure: RIGHT HEART CATH;  Surgeon: Jolaine Artist, MD;  Location: [facility];  Service: Cardiovascular;  Laterality: N/A;   SHUNTOGRAM Left 11/30/2013   Procedure: Earney Mallet;  Surgeon: Serafina Mitchell, MD;  Location: [facility] CATH LAB;  Service: Cardiovascular;  Laterality: Left;   TEE WITHOUT CARDIOVERSION N/A 02/13/2020   Procedure: TRANSESOPHAGEAL ECHOCARDIOGRAM (TEE);  Surgeon: Josue Hector, MD;  Location[facility];  Service: Cardiovascular;  Laterality: N/A;    Social History Social History   Tobacco Use   Smoking status: Never   Smokeless tobacco: Never  Vaping Use   Vaping Use: Never used  Substance Use Topics   Alcohol use: No    Alcohol/week: 0.0 standard drinks of alcohol   Drug use: No    Family History Family History  Problem Relation Age of Onset   Cancer Mother        breast   Hypertension Father     Allergies   Allergen Reactions   Pollen Extract Other (See Comments)    Watery eyes     REVIEW OF SYSTEMS  General: [ ]  Weight loss, [ ]  Fever, [ ]  chills Neurologic: [ ]  Dizziness, [ ]  Blackouts, [ ]  Seizure [ ]  Stroke, [ ]  "Mini stroke", [ ]  Slurred speech, [ ]  Temporary blindness; [ ]  weakness in arms or legs, [ ]  Hoarseness [ ]  Dysphagia Cardiac: [ ]  Chest pain/pressure, [ ]  Shortness of breath at rest [ ]  Shortness of breath with exertion, [ ]   Atrial fibrillation or irregular heartbeat  Vascular: [ ]  Pain in legs with walking, [ ]  Pain in legs at rest, [ ]  Pain in legs at night,  [ ]  Non-healing ulcer, [ ]  Blood clot in vein/DVT,   Pulmonary: [ ]  Home oxygen, [ ]  Productive cough, [ ]  Coughing up blood, [ ]  Asthma,  [ ]  Wheezing [ ]  COPD Musculoskeletal:  [ ]  Arthritis, [ ]  Low back pain, [ ]  Joint pain Hematologic: [ ]  Easy Bruising, [ ]  Anemia; [ ]  Hepatitis Gastrointestinal: [ ]  Blood in stool, [ ]  Gastroesophageal Reflux/heartburn, Urinary: [ ]  chronic Kidney disease, [ ]  on HD - [ ]  MWF or [ ]  TTHS, [ ]  Burning with urination, [ ]  Difficulty urinating Skin: [ ]  Rashes, [ ]  Wounds Psychological: [ ]  Anxiety, [ ]  Depression  Physical Examination Vitals:   08/11/22 0500 08/11/22 0528 08/11/22 0725 08/11/22 1519  BP:  (!) 145/72 (!) 147/68 (!) 143/76  Pulse:  79 75 80  Resp:  19  16  Temp:  98.7 F (37.1 C) 98.1 F (36.7 C) 98.2 F (36.8 C)  TempSrc:  Oral Oral Oral  SpO2:  100% 100% 97%  Weight: 54 kg     Height:       Body mass index is 21.09 kg/m.  General:  WDWN in NAD  HENT: WNL Eyes: Pupils equal Pulmonary: normal non-labored breathing , without Rales, rhonchi,  wheezing Cardiac: RRR, without  Murmurs, rubs or gallops; No carotid bruits Abdomen: soft, NT, no masses Skin: no rashes, ulcers noted;  no Gangrene , positive left UE AC area cellulitis of skin over distal AV graft; no open wounds;   Vascular Exam/Pulses:Palpable radial pulses B UE.  Right UE AV graft  with palpable thrill and palpable radial pulse.  Left UE graft without thrill, firm with mild erythema.   Musculoskeletal: no muscle wasting or atrophy; no edema  Neurologic: A&O X 3; Appropriate Affect ;  SENSATION: normal; MOTOR FUNCTION: 5/5 Symmetric Speech is fluent/normal   Significant Diagnostic Studies: CBC Lab Results  Component Value Date   WBC 8.0 08/09/2022   HGB 9.2 (L) 08/09/2022   HCT 28.0 (L) 08/09/2022   MCV 83.8 08/09/2022   PLT 109 (L) 08/09/2022    BMET    Component Value Date/Time   NA 132 (L) 08/09/2022 0313   K 3.7 08/09/2022 0313   CL 91 (L) 08/09/2022 0313   CO2 22 08/09/2022 0313   GLUCOSE 173 (H) 08/09/2022 0313   BUN 49 (H) 08/09/2022 0313   CREATININE 8.41 (H) 08/09/2022 0313   CALCIUM 9.1 08/09/2022 0313   CALCIUM 10.2 12/03/2009 1101   GFRNONAA 6 (L) 08/09/2022 0313   GFRAA 9 (L) 02/14/2020 0116   Estimated Creatinine Clearance: 7.1 mL/min (A) (by C-G formula based on SCr of 8.41 mg/dL (H)).  COAG Lab Results  Component Value Date   INR 1.1 10/21/2019   INR 1.15 04/16/2018   INR 1.04 03/29/2014        ASSESSMENT/PLAN:  47 y/o female presented with SOB, + blood cultures for MSSA and PNA She has a 1-2 day history of new erythema and edema over the AC/AV graft of the left UE.  This graft is non functional. Plan for Irrigation and debridement with excision of non functioning AV graft.  NPO ordered and consent placed.   Roxy Horseman 08/11/2022 5:01 PM   I have independently interviewed and examined patient and agree with PA assessment and plan above.  Graft appears grossly infected and will need excision with vein patch angioplasty of the left axillary artery tomorrow in the OR.  She will be n.p.o. past midnight.  Colene M[place] C. Donzetta Matters, MD [facility] Office: [phone] Pager: [phone]

## 2022-08-11 NOTE — Progress Notes (Signed)
Progress Note    [name]   OZY:[id number]  DOB: 12-14-1975  DOA: 08/06/2022     3 PCP: Benito Mccreedy, MD  Initial CC: SOB  Hospital Course: Ms. Dollens is a 47 yo female with PMH ESRD on HD, HTN, HLD, anxiety, DM II, CVA who presented with shortness of breath, weakness.  CXR on admission was concerning for right middle lobe opacity consistent with pneumonia.  She was started on Rocephin and azithromycin. After admission, we were informed that outpatient blood cultures from 08/05/2022 became positive for GPC, 4/4 bottles which have now speciated to MSSA. Cultures were obtained due to chills she developed during dialysis session.  Interval History:  No events overnight. Was feeling in normal state when seen this morning. After evaluation with ID in the afternoon, her left arm was felt to be tender and edematous.  TEE now officially planned for Friday, 1/26.   Assessment and Plan:  CAP Acute respiratory failure with hypoxia - resolved  - Patient presenting with shortness of breath.  Found to have chest x-ray consistent with right middle lobe pneumonia and associated right pleural effusion.  Saturating 78% on room air on arrival, improved to the 90s on 1 to 2 L - some possible contribution from volume overload as well - O2 now weaned off; on RA - s/p rocephin and azithro on admission; d/c'd per ID  MSSA bacteremia - contacted by Dr. Moshe Cipro on 1/18; patient is growing 4/4 bottles of GPC which have speciated to MSSA - given 2 g Ancef afternoon of 1/18; then started on Vanc - repeat cultures obtained 08/07/22 remain negative - TTE 1/19 negative for veg -Treated with Vanc until cultures outpatient updated to MSSA - transitioned back to Ancef on 1/20 after cultures updated - ID following - discussed with cardiology regarding TEE; busy schedule, plan is TEE on 1/26  Left arm swelling; hx LUE vascular graft (now non-functioning) - patient noted to have some tenderness  and swelling in arm on 1/22 per ID - will discuss with vascular surgery for evaluation; concern would be seeded infection from MSSA  Right ankle pain - improving  - patient developed worsening Right ankle pain starting 1/17 (in the ER) and this progressed such that she could not ambulate enough to discharge home 1/18 - CT right ankle obtained to rule out septic joint; mostly shows arthritis changes - continue pain control and supportive care   Transaminitis - Patient noted to have significant transaminitis with AST of 720 and ALT of 338.  Also has had some recent diarrhea.   - negative hepatitis panel -Given downtrend since admission, suspect was elevated due to infection - LFTs improving   Thrombocytopenia -Also suspected due to infection -Platelets slowly improving - trend CBC   ESRD on HD - dialyzed on 1/17 on admission - will need ongoing HD while inpatient; nephrology following    IDDM - continue insulin pump and monitor CBGs   History of CVA - Continue home asa   Hypertension - Continue home amlodipine and labetalol   Old records reviewed in assessment of this patient  Antimicrobials: Rocephin 1/17 >> 1/19 Azithro 1/18 >> 1/19 Vanc 1/18 >> 1/20 Ancef 1/20 >> current    DVT prophylaxis:  SCDs Start: 08/06/22 1722   Code Status:   Code Status: Full Code  Mobility Assessment (last 72 hours)     Mobility Assessment     Row Name 08/10/22 2030 08/09/22 1946 08/09/22 0945 08/08/22 2232     Does  patient have an order for bedrest or is patient medically unstable No - Continue assessment No - Continue assessment No - Continue assessment No - Continue assessment    What is the highest level of mobility based on the progressive mobility assessment? Level 5 (Walks with assist in room/hall) - Balance while stepping forward/back and can walk in room with assist - Complete Level 5 (Walks with assist in room/hall) - Balance while stepping forward/back and can walk in room  with assist - Complete Level 5 (Walks with assist in room/hall) - Balance while stepping forward/back and can walk in room with assist - Complete Level 5 (Walks with assist in room/hall) - Balance while stepping forward/back and can walk in room with assist - Complete    Is the above level different from baseline mobility prior to current illness? -- Yes - Recommend PT order Yes - Recommend PT order Yes - Recommend PT order             Barriers to discharge: none Disposition Plan:  Home 2-3 days Status is: Inpatient  Objective: Blood pressure (!) 143/76, pulse 80, temperature 98.2 F (36.8 C), temperature source Oral, resp. rate 16, height 5\' 3"  (1.6 m), weight 54 kg, SpO2 97 %.  Examination:  Physical Exam Constitutional:      General: She is not in acute distress.    Appearance: She is well-developed.  HENT:     Head: Normocephalic and atraumatic.     Mouth/Throat:     Mouth: Mucous membranes are moist.  Eyes:     Extraocular Movements: Extraocular movements intact.  Cardiovascular:     Rate and Rhythm: Normal rate and regular rhythm.     Heart sounds: Murmur heard.     Comments: 3/6 HSM noted throughout precordium Pulmonary:     Effort: Pulmonary effort is normal. No respiratory distress.     Breath sounds: Rhonchi present.  Abdominal:     General: Bowel sounds are normal. There is no distension.     Palpations: Abdomen is soft.     Tenderness: There is no abdominal tenderness.  Musculoskeletal:        General: No swelling.     Cervical back: Normal range of motion.     Comments: Improved TTP in right ankle; improved ROM  Skin:    General: Skin is warm and dry.  Neurological:     General: No focal deficit present.     Mental Status: She is alert.  Psychiatric:        Mood and Affect: Mood normal.      Consultants:  Nephrology ID  Procedures:    Data Reviewed: Results for orders placed or performed during the hospital encounter of 08/06/22 (from the past  24 hour(s))  Glucose, capillary     Status: None   Collection Time: 08/10/22  4:35 PM  Result Value Ref Range   Glucose-Capillary 94 70 - 99 mg/dL  Glucose, capillary     Status: Abnormal   Collection Time: 08/10/22  9:28 PM  Result Value Ref Range   Glucose-Capillary 187 (H) 70 - 99 mg/dL  Glucose, capillary     Status: Abnormal   Collection Time: 08/10/22 10:26 PM  Result Value Ref Range   Glucose-Capillary 189 (H) 70 - 99 mg/dL  Glucose, capillary     Status: Abnormal   Collection Time: 08/11/22  2:42 AM  Result Value Ref Range   Glucose-Capillary 163 (H) 70 - 99 mg/dL  Glucose, capillary  Status: Abnormal   Collection Time: 08/11/22  7:26 AM  Result Value Ref Range   Glucose-Capillary 129 (H) 70 - 99 mg/dL  Glucose, capillary     Status: None   Collection Time: 08/11/22 11:39 AM  Result Value Ref Range   Glucose-Capillary 83 70 - 99 mg/dL   Comment 1 Notify RN     I have reviewed pertinent nursing notes, vitals, labs, and images as necessary. I have ordered labwork to follow up on as indicated.  I have reviewed the last notes from staff over past 24 hours. I have discussed patient's care plan and test results with nursing staff, CM/SW, and other staff as appropriate.  Time spent: Greater than 50% of the 55 minute visit was spent in counseling/coordination of care for the patient as laid out in the A&P.   LOS: 3 days   Dwyane Dee, MD [facility] 08/11/2022, 4:24 PM

## 2022-08-11 NOTE — Consult Note (Addendum)
CONSULT NOTE   MRN : [id number]  Reason for Consult: Left UE non functioning AV graft with erythema and edema possible infection Referring Physician: Dr. Sabino Gasser IM  History of Present Illness: 47 y/o female with long history of ESRD.  She had a previous left UE AV graft that has not been functional for > 10 years. She states that yesterday a blood pressure cuff was used over the left UE and that is when the edema and erythema occurred.    She presented with a CC of SOB, weakness, and a chest x ray demonstrated positive for PNA.  She has positive blood cultures MSSA bacteremia and ID is following.   She is currently on Ancef IV.  Antibiotics:  Doxycycline 1/17, Azithromycin 1/18-1/19 Ceftriaxone 1/17- 1/19 Cefazolin 1/18, cefazolin 1/20   Past medical history: HTN, HLD, anxiety, DM II, CVA         Current Facility-Administered Medications  Medication Dose Route Frequency Provider Last Rate Last Admin   acetaminophen (TYLENOL) tablet 1,000 mg  1,000 mg Oral Q6H Dwyane Dee, MD   1,000 mg at 08/09/22 1809   amLODipine (NORVASC) tablet 10 mg  10 mg Oral Daily Marcelyn Bruins, MD   10 mg at 08/11/22 0935   aspirin EC tablet 81 mg  81 mg Oral Daily Marcelyn Bruins, MD   81 mg at 08/07/22 1001   [START ON 08/12/2022] ceFAZolin (ANCEF) IVPB 2g/100 mL premix  2 g Intravenous Q Florina Ou, MD       Chlorhexidine Gluconate Cloth 2 % PADS 6 each  6 each Topical Q0600 Dwana Melena, MD   6 each at 08/11/22 0647   insulin pump   Subcutaneous TID WC, HS, 0200 Marcelyn Bruins, MD   Given at 08/11/22 0741   labetalol (NORMODYNE) tablet 300 mg  300 mg Oral Daily Marcelyn Bruins, MD   300 mg at 08/11/22 0935   polyethylene glycol (MIRALAX / GLYCOLAX) packet 17 g  17 g Oral Daily PRN Marcelyn Bruins, MD       sodium chloride flush (NS) 0.9 % injection 3 mL  3 mL Intravenous Q12H Marcelyn Bruins, MD   3 mL at 08/11/22 0936    Pt meds include: Statin  :Yes Betablocker: No ASA: Yes Other anticoagulants/antiplatelets: none  Past Medical History:  Diagnosis Date   Anemia    Arthritis    Ascites 03/28/2020   Bacteremia 02/11/2020   Cerebral infarction due to unspecified mechanism 05/22/2014   CHF (congestive heart failure) (HCC)    Diabetes mellitus    Diabetic retinopathy ([place])    Hx of laser Rx's   DM type 1 (diabetes mellitus, type 1) ([place]) 09/12/2014   Age of onset for DM type 1 was age 21.      Enlarged thyroid gland    ESRD on hemodialysis ([place])    ESRD due to DM type I, age of onset DM 1 was age 18.  Went on dialysis in March 2012.  Gets HD now at [facility] on a TTS schedule.     ESRD on hemodialysis ([place]) 09/12/2014   ESRD due to DM type 1.  Started HD in 2012 at [facility].  Gets HD there on a TTS schedule.     GERD (gastroesophageal reflux disease)    HCAP (healthcare-associated pneumonia) 09/11/2014   Hemorrhage from arteriovenous dialysis graft ([place]) 10/20/2019   History of blood transfusion    Hyperlipidemia  Hypertension    Hypertensive emergency 05/22/2014   Peripheral vascular disease ([place])    Pneumonia     Past Surgical History:  Procedure Laterality Date   A/V FISTULAGRAM Right 10/31/2020   Procedure: A/V FISTULAGRAM;  Surgeon: Cherre Robins, MD;  Location: [place] [facility];  Service: Cardiovascular;  Laterality: Right;   ARTERIOVENOUS GRAFT PLACEMENT     ARTERY REPAIR Left 12/10/2012   Procedure: BRACHIAL ARTERY REPAIR;  Surgeon: Angelia Mould, MD;  Location: [place];  Service: Vascular;  Laterality: Left;  Exploration Left Brachial Artery for AVF   AV FISTULA PLACEMENT Right 01/11/2018   Procedure: INSERTION OF ARTERIOVENOUS (AV) GORE-TEX GRAFT  UPPER ARM;  Surgeon: Angelia Mould, MD;  Location: [place];  Service: Vascular;  Laterality: Right;   [place] Right 09/28/2017   Procedure: FIRST STAGE BASILIC VEIN TRANSPOSITION;  Surgeon: Angelia Mould, MD;   Location: [place];  Service: Vascular;  Laterality: Right;   CESAREAN SECTION     CHEST TUBE INSERTION Right 06/01/2020   Procedure: CHEST TUBE INSERTION;  Surgeon: Garner Nash, DO;  Location: [place];  Service: Pulmonary;  Laterality: Right;   CHEST TUBE INSERTION Right 07/04/2020   Procedure: INSERTION PLEURAL DRAINAGE CATHETER;  Surgeon: Melrose Nakayama, MD;  Location: [place];  Service: Thoracic;  Laterality: Right;   EYE SURGERY     [place]   EYE SURGERY Left    IR THORACENTESIS ASP PLEURAL SPACE W/IMG GUIDE  05/31/2020   IR THORACENTESIS ASP PLEURAL SPACE W/IMG GUIDE  06/22/2020   REVISION OF ARTERIOVENOUS GORETEX GRAFT Left 08/01/2016   Procedure: REVISION OF ARTERIOVENOUS GORETEX GRAFT;  Surgeon: Angelia Mould, MD;  Location: [facility];  Service: Vascular;  Laterality: Left;   REVISION OF ARTERIOVENOUS GORETEX GRAFT Right 10/21/2019   Procedure: REVISION OF ARTERIOVENOUS GORETEX GRAFT ARM;  Surgeon: Rosetta Posner, MD;  Location: [facility];  Service: Vascular;  Laterality: Right;   RIGHT HEART CATH N/A 08/01/2020   Procedure: RIGHT HEART CATH;  Surgeon: Jolaine Artist, MD;  Location: [place] [facility];  Service: Cardiovascular;  Laterality: N/A;   SHUNTOGRAM Left 11/30/2013   Procedure: Earney Mallet;  Surgeon: Serafina Mitchell, MD;  Location: [facility] C[facility] LAB;  Service: Cardiovascular;  Laterality: Left;   TEE WITHOUT CARDIOVERSION N/A 02/13/2020   Procedure: TRANSESOPHAGEAL ECHOCARDIOGRAM (TEE);  Surgeon: Josue Hector, MD;  [facility]: [facility] ENDOSCOPY;  Service: Cardiovascular;  Laterality: N/A;    Social History Social History   Tobacco Use   Smoking status: Never   Smokeless tobacco: Never  Vaping Use   Vaping Use: Never used  Substance Use Topics   Alcohol use: No    Alcohol/week: 0.0 standard drinks of alcohol   Drug use: No    Family History Family History  Problem Relation Age of Onset   Cancer Mother        breast   Hypertension Father     Allergies   Allergen Reactions   Pollen Extract Other (See Comments)    Watery eyes     REVIEW OF SYSTEMS  General: [ ]  Weight loss, [ ]  Fever, [ ]  chills Neurologic: [ ]  Dizziness, [ ]  Blackouts, [ ]  Seizure [ ]  Stroke, [ ]  "Mini stroke", [ ]  Slurred speech, [ ]  Temporary blindness; [ ]  weakness in arms or legs, [ ]  Hoarseness [ ]  Dysphagia Cardiac: [ ]  Chest pain/pressure, [ ]  Shortness of breath at rest [ ]  Shortness of breath with exertion, [ ]   Atrial fibrillation or irregular heartbeat  Vascular: [ ]  Pain in legs with walking, [ ]  Pain in legs at rest, [ ]  Pain in legs at night,  [ ]  Non-healing ulcer, [ ]  Blood clot in vein/DVT,   Pulmonary: [ ]  Home oxygen, [ ]  Productive cough, [ ]  Coughing up blood, [ ]  Asthma,  [ ]  Wheezing [ ]  COPD Musculoskeletal:  [ ]  Arthritis, [ ]  Low back pain, [ ]  Joint pain Hematologic: [ ]  Easy Bruising, [ ]  Anemia; [ ]  Hepatitis Gastrointestinal: [ ]  Blood in stool, [ ]  Gastroesophageal Reflux/heartburn, Urinary: [ ]  chronic Kidney disease, [ ]  on HD - [ ]  MWF or [ ]  TTHS, [ ]  Burning with urination, [ ]  Difficulty urinating Skin: [ ]  Rashes, [ ]  Wounds Psychological: [ ]  Anxiety, [ ]  Depression  Physical Examination Vitals:   08/11/22 0500 08/11/22 0528 08/11/22 0725 08/11/22 1519  BP:  (!) 145/72 (!) 147/68 (!) 143/76  Pulse:  79 75 80  Resp:  19  16  Temp:  98.7 F (37.1 C) 98.1 F (36.7 C) 98.2 F (36.8 C)  TempSrc:  Oral Oral Oral  SpO2:  100% 100% 97%  Weight: 54 kg     Height:       Body mass index is 21.09 kg/m.  General:  WDWN in NAD  HENT: WNL Eyes: Pupils equal Pulmonary: normal non-labored breathing , without Rales, rhonchi,  wheezing Cardiac: RRR, without  Murmurs, rubs or gallops; No carotid bruits Abdomen: soft, NT, no masses Skin: no rashes, ulcers noted;  no Gangrene , positive left UE AC area cellulitis of skin over distal AV graft; no open wounds;   Vascular Exam/Pulses:Palpable radial pulses B UE.  Right UE AV graft  with palpable thrill and palpable radial pulse.  Left UE graft without thrill, firm with mild erythema.   Musculoskeletal: no muscle wasting or atrophy; no edema  Neurologic: A&O X 3; Appropriate Affect ;  SENSATION: normal; MOTOR FUNCTION: 5/5 Symmetric Speech is fluent/normal   Significant Diagnostic Studies: CBC Lab Results  Component Value Date   WBC 8.0 08/09/2022   HGB 9.2 (L) 08/09/2022   HCT 28.0 (L) 08/09/2022   MCV 83.8 08/09/2022   PLT 109 (L) 08/09/2022    BMET    Component Value Date/Time   NA 132 (L) 08/09/2022 0313   K 3.7 08/09/2022 0313   CL 91 (L) 08/09/2022 0313   CO2 22 08/09/2022 0313   GLUCOSE 173 (H) 08/09/2022 0313   BUN 49 (H) 08/09/2022 0313   CREATININE 8.41 (H) 08/09/2022 0313   CALCIUM 9.1 08/09/2022 0313   CALCIUM 10.2 12/03/2009 1101   GFRNONAA 6 (L) 08/09/2022 0313   GFRAA 9 (L) 02/14/2020 0116   Estimated Creatinine Clearance: 7.1 mL/min (A) (by C-G formula based on SCr of 8.41 mg/dL (H)).  COAG Lab Results  Component Value Date   INR 1.1 10/21/2019   INR 1.15 04/16/2018   INR 1.04 03/29/2014        ASSESSMENT/PLAN:  47 y/o female presented with SOB, + blood cultures for MSSA and PNA She has a 1-2 day history of new erythema and edema over the AC/AV graft of the left UE.  This graft is non functional. Plan for Irrigation and debridement with excision of non functioning AV graft.  NPO ordered and consent placed.   Roxy Horseman 08/11/2022 5:01 PM   I have independently interviewed and examined patient and agree with PA assessment and plan above.  Graft appears grossly infected and will need excision with vein patch angioplasty of the left axillary artery tomorrow in the OR.  She will be n.p.o. past midnight.  Seferino Oscar C. Donzetta Matters, MD [facility] Office: [phone] Pager: [phone]

## 2022-08-12 ENCOUNTER — Encounter (HOSPITAL_COMMUNITY)
Admission: EM | Disposition: A | Discharge status: Home Health | Payer: Self-pay | Source: Home / Self Care | Attending: Internal Medicine

## 2022-08-12 ENCOUNTER — Inpatient Hospital Stay (HOSPITAL_COMMUNITY): Payer: Medicare HMO | Admitting: Anesthesiology

## 2022-08-12 ENCOUNTER — Encounter (HOSPITAL_COMMUNITY): Payer: Self-pay | Admitting: Internal Medicine

## 2022-08-12 ENCOUNTER — Other Ambulatory Visit: Payer: Self-pay

## 2022-08-12 DIAGNOSIS — I509 Heart failure, unspecified: Secondary | ICD-10-CM

## 2022-08-12 DIAGNOSIS — N186 End stage renal disease: Secondary | ICD-10-CM | POA: Diagnosis not present

## 2022-08-12 DIAGNOSIS — T827XXA Infection and inflammatory reaction due to other cardiac and vascular devices, implants and grafts, initial encounter: Secondary | ICD-10-CM

## 2022-08-12 DIAGNOSIS — B9561 Methicillin susceptible Staphylococcus aureus infection as the cause of diseases classified elsewhere: Secondary | ICD-10-CM | POA: Diagnosis not present

## 2022-08-12 DIAGNOSIS — E1022 Type 1 diabetes mellitus with diabetic chronic kidney disease: Secondary | ICD-10-CM | POA: Diagnosis not present

## 2022-08-12 DIAGNOSIS — J189 Pneumonia, unspecified organism: Secondary | ICD-10-CM | POA: Diagnosis not present

## 2022-08-12 DIAGNOSIS — T82898A Other specified complication of vascular prosthetic devices, implants and grafts, initial encounter: Secondary | ICD-10-CM | POA: Diagnosis not present

## 2022-08-12 DIAGNOSIS — I132 Hypertensive heart and chronic kidney disease with heart failure and with stage 5 chronic kidney disease, or end stage renal disease: Secondary | ICD-10-CM

## 2022-08-12 DIAGNOSIS — R7881 Bacteremia: Secondary | ICD-10-CM | POA: Diagnosis not present

## 2022-08-12 DIAGNOSIS — N185 Chronic kidney disease, stage 5: Secondary | ICD-10-CM | POA: Diagnosis not present

## 2022-08-12 DIAGNOSIS — J9601 Acute respiratory failure with hypoxia: Secondary | ICD-10-CM | POA: Diagnosis not present

## 2022-08-12 DIAGNOSIS — Z992 Dependence on renal dialysis: Secondary | ICD-10-CM

## 2022-08-12 DIAGNOSIS — D631 Anemia in chronic kidney disease: Secondary | ICD-10-CM

## 2022-08-12 HISTORY — PX: AVGG REMOVAL: SHX5153

## 2022-08-12 LAB — POCT I-STAT, CHEM 8
BUN: 43 mg/dL — ABNORMAL HIGH (ref 6–20)
Calcium, Ion: 1.12 mmol/L — ABNORMAL LOW (ref 1.15–1.40)
Chloride: 101 mmol/L (ref 98–111)
Creatinine, Ser: 9.8 mg/dL — ABNORMAL HIGH (ref 0.44–1.00)
Glucose, Bld: 153 mg/dL — ABNORMAL HIGH (ref 70–99)
HCT: 30 % — ABNORMAL LOW (ref 36.0–46.0)
Hemoglobin: 10.2 g/dL — ABNORMAL LOW (ref 12.0–15.0)
Potassium: 3.7 mmol/L (ref 3.5–5.1)
Sodium: 137 mmol/L (ref 135–145)
TCO2: 22 mmol/L (ref 22–32)

## 2022-08-12 LAB — CULTURE, BLOOD (ROUTINE X 2)
Culture: NO GROWTH
Culture: NO GROWTH
Special Requests: ADEQUATE
Special Requests: ADEQUATE

## 2022-08-12 LAB — GLUCOSE, CAPILLARY
Glucose-Capillary: 187 mg/dL — ABNORMAL HIGH (ref 70–99)
Glucose-Capillary: 190 mg/dL — ABNORMAL HIGH (ref 70–99)
Glucose-Capillary: 210 mg/dL — ABNORMAL HIGH (ref 70–99)
Glucose-Capillary: 256 mg/dL — ABNORMAL HIGH (ref 70–99)
Glucose-Capillary: 300 mg/dL — ABNORMAL HIGH (ref 70–99)
Glucose-Capillary: 160 mg/dL — ABNORMAL HIGH (ref 70–99)

## 2022-08-12 LAB — HEPATITIS B SURFACE ANTIBODY, QUANTITATIVE: Hep B S AB Quant (Post): 103.9 m[IU]/mL (ref >9.9)

## 2022-08-12 SURGERY — REMOVAL OF ARTERIOVENOUS GORETEX GRAFT (AVGG)
Anesthesia: General | Site: Arm Upper | Laterality: Left

## 2022-08-12 MED ORDER — CEFAZOLIN SODIUM-DEXTROSE 2-3 GM-%(50ML) IV SOLR
INTRAVENOUS | Status: DC | PRN
  Administered 2022-08-12: 2 g via INTRAVENOUS

## 2022-08-12 MED ORDER — OXYCODONE HCL 5 MG/5ML PO SOLN: 5.0000 mg | Freq: Once | ORAL | Status: DC | PRN

## 2022-08-12 MED ORDER — LIDOCAINE 2% (20 MG/ML) 5 ML SYRINGE
INTRAMUSCULAR | Status: AC
  Filled 2022-08-12: qty 5

## 2022-08-12 MED ORDER — SODIUM CHLORIDE 0.9 % IV SOLN: INTRAVENOUS | Status: DC

## 2022-08-12 MED ORDER — FENTANYL CITRATE (PF) 250 MCG/5ML IJ SOLN
INTRAMUSCULAR | Status: AC
  Filled 2022-08-12: qty 5

## 2022-08-12 MED ORDER — FENTANYL CITRATE (PF) 250 MCG/5ML IJ SOLN
INTRAMUSCULAR | Status: DC | PRN
  Administered 2022-08-12: 100 ug via INTRAVENOUS
  Administered 2022-08-12 (×2): 50 ug via INTRAVENOUS

## 2022-08-12 MED ORDER — DEXAMETHASONE SODIUM PHOSPHATE 10 MG/ML IJ SOLN
INTRAMUSCULAR | Status: DC | PRN
  Administered 2022-08-12: 5 mg via INTRAVENOUS

## 2022-08-12 MED ORDER — HEPARIN 6000 UNIT IRRIGATION SOLUTION
Status: DC | PRN
  Administered 2022-08-12: 1

## 2022-08-12 MED ORDER — HEPARIN 6000 UNIT IRRIGATION SOLUTION
Status: AC
  Filled 2022-08-12: qty 500

## 2022-08-12 MED ORDER — GLYCOPYRROLATE PF 0.2 MG/ML IJ SOSY
PREFILLED_SYRINGE | INTRAMUSCULAR | Status: AC
  Filled 2022-08-12: qty 1

## 2022-08-12 MED ORDER — MIDAZOLAM HCL 2 MG/2ML IJ SOLN
INTRAMUSCULAR | Status: AC
  Filled 2022-08-12: qty 2

## 2022-08-12 MED ORDER — FENTANYL CITRATE (PF) 100 MCG/2ML IJ SOLN
25.0000 ug | INTRAMUSCULAR | Status: DC | PRN
  Administered 2022-08-12: 50 ug via INTRAVENOUS

## 2022-08-12 MED ORDER — PHENYLEPHRINE 80 MCG/ML (10ML) SYRINGE FOR IV PUSH (FOR BLOOD PRESSURE SUPPORT)
PREFILLED_SYRINGE | INTRAVENOUS | Status: AC
  Filled 2022-08-12: qty 10

## 2022-08-12 MED ORDER — CHLORHEXIDINE GLUCONATE 0.12 % MT SOLN
OROMUCOSAL | Status: AC
  Administered 2022-08-12: 15 mL via OROMUCOSAL
  Filled 2022-08-12: qty 15

## 2022-08-12 MED ORDER — HEPARIN SODIUM (PORCINE) 1000 UNIT/ML DIALYSIS
2000.0000 [IU] | INTRAMUSCULAR | Status: DC | PRN
  Administered 2022-08-12: 2000 [IU] via INTRAVENOUS_CENTRAL

## 2022-08-12 MED ORDER — OXYCODONE HCL 5 MG PO TABS: 5.0000 mg | ORAL_TABLET | Freq: Once | ORAL | Status: DC | PRN

## 2022-08-12 MED ORDER — SUCCINYLCHOLINE CHLORIDE 200 MG/10ML IV SOSY
PREFILLED_SYRINGE | INTRAVENOUS | Status: AC
  Filled 2022-08-12: qty 10

## 2022-08-12 MED ORDER — OXYCODONE HCL 5 MG PO TABS
5.0000 mg | ORAL_TABLET | ORAL | Status: DC | PRN
  Administered 2022-08-12: 10 mg via ORAL
  Administered 2022-08-13: 5 mg via ORAL
  Administered 2022-08-13: 10 mg via ORAL
  Administered 2022-08-14: 5 mg via ORAL
  Filled 2022-08-12: qty 1
  Filled 2022-08-12: qty 2
  Filled 2022-08-12: qty 1

## 2022-08-12 MED ORDER — PROMETHAZINE HCL 25 MG/ML IJ SOLN: 6.2500 mg | INTRAMUSCULAR | Status: DC | PRN

## 2022-08-12 MED ORDER — LIDOCAINE 2% (20 MG/ML) 5 ML SYRINGE
INTRAMUSCULAR | Status: DC | PRN
  Administered 2022-08-12: 60 mg via INTRAVENOUS

## 2022-08-12 MED ORDER — DEXAMETHASONE SODIUM PHOSPHATE 10 MG/ML IJ SOLN
INTRAMUSCULAR | Status: AC
  Filled 2022-08-12: qty 1

## 2022-08-12 MED ORDER — PROPOFOL 10 MG/ML IV BOLUS
INTRAVENOUS | Status: AC
  Filled 2022-08-12: qty 20

## 2022-08-12 MED ORDER — ONDANSETRON HCL 4 MG/2ML IJ SOLN
INTRAMUSCULAR | Status: DC | PRN
  Administered 2022-08-12: 4 mg via INTRAVENOUS

## 2022-08-12 MED ORDER — SUGAMMADEX SODIUM 200 MG/2ML IV SOLN
INTRAVENOUS | Status: DC | PRN
  Administered 2022-08-12: 200 mg via INTRAVENOUS

## 2022-08-12 MED ORDER — PHENYLEPHRINE 80 MCG/ML (10ML) SYRINGE FOR IV PUSH (FOR BLOOD PRESSURE SUPPORT)
PREFILLED_SYRINGE | INTRAVENOUS | Status: DC | PRN
  Administered 2022-08-12: 160 ug via INTRAVENOUS

## 2022-08-12 MED ORDER — CHLORHEXIDINE GLUCONATE 0.12 % MT SOLN: 15.0000 mL | OROMUCOSAL | Status: AC

## 2022-08-12 MED ORDER — SUCCINYLCHOLINE 20MG/ML (10ML) SYRINGE FOR MEDFUSION PUMP - OPTIME
INTRAMUSCULAR | Status: DC | PRN
  Administered 2022-08-12: 80 mg via INTRAVENOUS

## 2022-08-12 MED ORDER — MIDAZOLAM HCL 2 MG/2ML IJ SOLN
INTRAMUSCULAR | Status: DC | PRN
  Administered 2022-08-12: 2 mg via INTRAVENOUS

## 2022-08-12 MED ORDER — HYDROMORPHONE HCL 1 MG/ML IJ SOLN: 0.5000 mg | INTRAMUSCULAR | Status: DC | PRN

## 2022-08-12 MED ORDER — EPINEPHRINE 1 MG/10ML IJ SOSY
PREFILLED_SYRINGE | INTRAMUSCULAR | Status: DC | PRN
  Administered 2022-08-12: 100 ug via INTRAVENOUS

## 2022-08-12 MED ORDER — FENTANYL CITRATE (PF) 100 MCG/2ML IJ SOLN
INTRAMUSCULAR | Status: AC
  Filled 2022-08-12: qty 2

## 2022-08-12 MED ORDER — GLYCOPYRROLATE 0.2 MG/ML IJ SOLN
INTRAMUSCULAR | Status: DC | PRN
  Administered 2022-08-12: .1 mg via INTRAVENOUS

## 2022-08-12 MED ORDER — PROPOFOL 10 MG/ML IV BOLUS
INTRAVENOUS | Status: DC | PRN
  Administered 2022-08-12: 120 mg via INTRAVENOUS
  Administered 2022-08-12: 50 mg via INTRAVENOUS

## 2022-08-12 MED ORDER — CEFAZOLIN SODIUM 1 G IJ SOLR
INTRAMUSCULAR | Status: AC
  Filled 2022-08-12: qty 20

## 2022-08-12 MED ORDER — ONDANSETRON HCL 4 MG/2ML IJ SOLN
INTRAMUSCULAR | Status: AC
  Filled 2022-08-12: qty 2

## 2022-08-12 MED ORDER — ROCURONIUM BROMIDE 100 MG/10ML IV SOLN
INTRAVENOUS | Status: DC | PRN
  Administered 2022-08-12: 30 mg via INTRAVENOUS

## 2022-08-12 MED ORDER — HEPARIN SODIUM (PORCINE) 1000 UNIT/ML IJ SOLN
INTRAMUSCULAR | Status: AC
  Filled 2022-08-12: qty 2

## 2022-08-12 MED ORDER — HEMOSTATIC AGENTS (NO CHARGE) OPTIME
TOPICAL | Status: DC | PRN
  Administered 2022-08-12: 1 via TOPICAL

## 2022-08-12 MED ORDER — 0.9 % SODIUM CHLORIDE (POUR BTL) OPTIME
TOPICAL | Status: DC | PRN
  Administered 2022-08-12: 1000 mL

## 2022-08-12 MED ORDER — EPINEPHRINE 1 MG/10ML IJ SOSY
PREFILLED_SYRINGE | INTRAMUSCULAR | Status: AC
  Filled 2022-08-12: qty 10

## 2022-08-12 SURGICAL SUPPLY — 42 items
BAG COUNTER SPONGE SURGICOUNT (BAG) ×1 IMPLANT
BNDG ELASTIC 4X5.8 VLCR STR LF (GAUZE/BANDAGES/DRESSINGS) IMPLANT
CANISTER SUCT 3000ML PPV (MISCELLANEOUS) ×1 IMPLANT
CANNULA VESSEL 3MM 2 BLNT TIP (CANNULA) IMPLANT
CLIP VESOCCLUDE MED 6/CT (CLIP) ×1 IMPLANT
CLIP VESOCCLUDE SM WIDE 6/CT (CLIP) ×1 IMPLANT
DERMABOND ADVANCED .7 DNX12 (GAUZE/BANDAGES/DRESSINGS) ×1 IMPLANT
DRAPE ORTHO SPLIT 77X108 STRL (DRAPES) ×1
DRAPE SURG ORHT 6 SPLT 77X108 (DRAPES) IMPLANT
ELECT REM PT RETURN 9FT ADLT (ELECTROSURGICAL) ×1
ELECTRODE REM PT RTRN 9FT ADLT (ELECTROSURGICAL) ×1 IMPLANT
GAUZE SPONGE 4X4 12PLY STRL (GAUZE/BANDAGES/DRESSINGS) IMPLANT
GLOVE BIO SURGEON STRL SZ7.5 (GLOVE) ×1 IMPLANT
GLOVE BIOGEL PI IND STRL 8 (GLOVE) ×1 IMPLANT
GLOVE SURG POLY ORTHO LF SZ7.5 (GLOVE) IMPLANT
GLOVE SURG UNDER LTX SZ8 (GLOVE) ×1 IMPLANT
GOWN STRL REUS W/ TWL LRG LVL3 (GOWN DISPOSABLE) ×3 IMPLANT
GOWN STRL REUS W/TWL LRG LVL3 (GOWN DISPOSABLE) ×3
KIT BASIN OR (CUSTOM PROCEDURE TRAY) ×1 IMPLANT
KIT TURNOVER KIT B (KITS) ×1 IMPLANT
LOOP VESSEL MAXI BLUE (MISCELLANEOUS) IMPLANT
NDL HYPO 25GX1X1/2 BEV (NEEDLE) ×1 IMPLANT
NEEDLE HYPO 25GX1X1/2 BEV (NEEDLE) ×1 IMPLANT
NS IRRIG 1000ML POUR BTL (IV SOLUTION) ×1 IMPLANT
PACK CV ACCESS (CUSTOM PROCEDURE TRAY) ×1 IMPLANT
PAD ARMBOARD 7.5X6 YLW CONV (MISCELLANEOUS) ×2 IMPLANT
POWDER SURGICEL 3.0 GRAM (HEMOSTASIS) IMPLANT
SHEATH PROBE COVER 6X72 (BAG) IMPLANT
SPIKE FLUID TRANSFER (MISCELLANEOUS) IMPLANT
SPONGE SURGIFOAM ABS GEL 100 (HEMOSTASIS) IMPLANT
SPONGE T-LAP 18X18 ~~LOC~~+RFID (SPONGE) IMPLANT
STAPLER VISISTAT 35W (STAPLE) IMPLANT
SUT MNCRL AB 4-0 PS2 18 (SUTURE) IMPLANT
SUT PROLENE 5 0 C 1 24 (SUTURE) IMPLANT
SUT PROLENE 6 0 BV (SUTURE) ×1 IMPLANT
SUT VIC AB 3-0 SH 27 (SUTURE) ×2
SUT VIC AB 3-0 SH 27X BRD (SUTURE) ×1 IMPLANT
SWAB COLLECTION DEVICE MRSA (MISCELLANEOUS) IMPLANT
SWAB CULTURE ESWAB REG 1ML (MISCELLANEOUS) IMPLANT
TOWEL GREEN STERILE (TOWEL DISPOSABLE) ×1 IMPLANT
UNDERPAD 30X36 HEAVY ABSORB (UNDERPADS AND DIAPERS) ×1 IMPLANT
WATER STERILE IRR 1000ML POUR (IV SOLUTION) ×1 IMPLANT

## 2022-08-12 NOTE — Progress Notes (Signed)
Progress Note    [name]   IDP:[id number]  DOB: Jul 07, 1976  DOA: 08/06/2022     4 PCP: Benito Mccreedy, MD  Initial CC: SOB  Hospital Course: [name] is a 47 yo female with PMH ESRD on HD, HTN, HLD, anxiety, DM II, CVA who presented with shortness of breath, weakness.  CXR on admission was concerning for right middle lobe opacity consistent with pneumonia.  She was started on Rocephin and azithromycin. After admission, we were informed that outpatient blood cultures from 08/05/2022 became positive for GPC, 4/4 bottles which have now speciated to MSSA. Cultures were obtained due to chills she developed during dialysis session.  Interval History:  No events overnight.  Ongoing tenderness and swelling in her left arm at site of prior graft placement.  Planning to undergo excision today in the OR with vascular surgery.  She had no other physical ailments this morning otherwise.  Assessment and Plan:  MSSA bacteremia - contacted by Dr. Moshe Cipro on 1/18; patient is growing 4/4 bottles of GPC which have speciated to MSSA - given 2 g Ancef afternoon of 1/18; then started on Vanc - repeat cultures obtained 08/07/22 remain negative - TTE 1/19 negative for veg -Treated with Vanc until cultures outpatient updated to MSSA - transitioned back to Ancef on 1/20 after cultures updated - ID following - discussed with cardiology regarding TEE; busy schedule, plan is TEE on 1/26  Infected LUE graft (old non-functioning graft) - suspected seeded infection from MSSA -Appreciate vascular evaluation.  Patient undergoing excision in the OR on 08/12/2022 -At risk for developing infection in right arm graft (where she gets current HD) and will need to be monitored as well  CAP - resolved Acute respiratory failure with hypoxia - resolved  - Patient presenting with shortness of breath.  Found to have chest x-ray consistent with right middle lobe pneumonia and associated right pleural  effusion.  Saturating 78% on room air on arrival, improved to the 90s on 1 to 2 L - some possible contribution from volume overload as well - O2 now weaned off; on RA - s/p rocephin and azithro on admission; d/c'd per ID  ESRD on HD - dialyzed on 1/17 on admission - will need ongoing HD while inpatient; nephrology following   Right ankle pain - resolved  - patient developed worsening Right ankle pain starting 1/17 (in the ER) and this progressed such that she could not ambulate enough to discharge home 1/18 - CT right ankle obtained to rule out septic joint; mostly shows arthritis changes - continue pain control and supportive care   Transaminitis - Patient noted to have significant transaminitis with AST of 720 and ALT of 338.  Also has had some recent diarrhea.   - negative hepatitis panel -Given downtrend since admission, suspect was elevated due to infection - LFTs improving   Thrombocytopenia -Also suspected due to infection -Platelets slowly improving - trend CBC   IDDM - continue insulin pump and monitor CBGs   History of CVA - Continue home asa   Hypertension - Continue home amlodipine and labetalol   Old records reviewed in assessment of this patient  Antimicrobials: Rocephin 1/17 >> 1/19 Azithro 1/18 >> 1/19 Vanc 1/18 >> 1/20 Ancef 1/20 >> current    DVT prophylaxis:  SCDs Start: 08/06/22 1722   Code Status:   Code Status: Full Code  Mobility Assessment (last 72 hours)     Mobility Assessment     Row Name 08/11/22 2102 08/10/22  2030 08/09/22 1946       Does patient have an order for bedrest or is patient medically unstable No - Continue assessment No - Continue assessment No - Continue assessment     What is the highest level of mobility based on the progressive mobility assessment? Level 5 (Walks with assist in room/hall) - Balance while stepping forward/back and can walk in room with assist - Complete Level 5 (Walks with assist in room/hall) -  Balance while stepping forward/back and can walk in room with assist - Complete Level 5 (Walks with assist in room/hall) - Balance while stepping forward/back and can walk in room with assist - Complete     Is the above level different from baseline mobility prior to current illness? Yes - Recommend PT order -- Yes - Recommend PT order              Barriers to discharge: none Disposition Plan:  Home 2-3 days Status is: Inpatient  Objective: Blood pressure (!) 153/71, pulse 78, temperature 97.8 F (36.6 C), temperature source Oral, resp. rate 17, height 5\' 3"  (1.6 m), weight 54 kg, SpO2 97 %.  Examination:  Physical Exam Constitutional:      General: She is not in acute distress.    Appearance: She is well-developed.  HENT:     Head: Normocephalic and atraumatic.     Mouth/Throat:     Mouth: Mucous membranes are moist.  Eyes:     Extraocular Movements: Extraocular movements intact.  Cardiovascular:     Rate and Rhythm: Normal rate and regular rhythm.     Heart sounds: Murmur heard.     Comments: 3/6 HSM noted throughout precordium Pulmonary:     Effort: Pulmonary effort is normal. No respiratory distress.     Breath sounds: Rhonchi present.  Abdominal:     General: Bowel sounds are normal. There is no distension.     Palpations: Abdomen is soft.     Tenderness: There is no abdominal tenderness.  Musculoskeletal:        General: No swelling.     Cervical back: Normal range of motion.     Comments: Improved TTP in right ankle; improved ROM. Swollen and tender left arm at location of prior graft site  Skin:    General: Skin is warm and dry.  Neurological:     General: No focal deficit present.     Mental Status: She is alert.  Psychiatric:        Mood and Affect: Mood normal.      Consultants:  Nephrology ID  Procedures:    Data Reviewed: Results for orders placed or performed during the hospital encounter of 08/06/22 (from the past 24 hour(s))  Glucose,  capillary     Status: Abnormal   Collection Time: 08/11/22  4:50 PM  Result Value Ref Range   Glucose-Capillary 214 (H) 70 - 99 mg/dL   Comment 1 Notify RN   Glucose, capillary     Status: Abnormal   Collection Time: 08/11/22  9:01 PM  Result Value Ref Range   Glucose-Capillary 205 (H) 70 - 99 mg/dL  Glucose, capillary     Status: Abnormal   Collection Time: 08/12/22  3:18 AM  Result Value Ref Range   Glucose-Capillary 187 (H) 70 - 99 mg/dL  Glucose, capillary     Status: Abnormal   Collection Time: 08/12/22  8:58 AM  Result Value Ref Range   Glucose-Capillary 190 (H) 70 - 99 mg/dL  I-STAT,  chem 8     Status: Abnormal   Collection Time: 08/12/22 10:56 AM  Result Value Ref Range   Sodium 137 135 - 145 mmol/L   Potassium 3.7 3.5 - 5.1 mmol/L   Chloride 101 98 - 111 mmol/L   BUN 43 (H) 6 - 20 mg/dL   Creatinine, Ser 9.80 (H) 0.44 - 1.00 mg/dL   Glucose, Bld 153 (H) 70 - 99 mg/dL   Calcium, Ion 1.12 (L) 1.15 - 1.40 mmol/L   TCO2 22 22 - 32 mmol/L   Hemoglobin 10.2 (L) 12.0 - 15.0 g/dL   HCT 30.0 (L) 36.0 - 46.0 %    I have reviewed pertinent nursing notes, vitals, labs, and images as necessary. I have ordered labwork to follow up on as indicated.  I have reviewed the last notes from staff over past 24 hours. I have discussed patient's care plan and test results with nursing staff, CM/SW, and other staff as appropriate.  Time spent: Greater than 50% of the 55 minute visit was spent in counseling/coordination of care for the patient as laid out in the A&P.   LOS: 4 days   Dwyane Dee, MD [facility] 08/12/2022, 1:02 PM

## 2022-08-12 NOTE — Anesthesia Postprocedure Evaluation (Signed)
Anesthesia Post Note  Patient: [name]  Procedure(s) Performed: LEFT ARM ARTERIOVENOUS GRAFT EXCISION WITH VEIN PATCH ANGIOPLASTY (Left: Arm Upper)     Patient location during evaluation: PACU Anesthesia Type: General Level of consciousness: awake and alert Pain management: pain level controlled Vital Signs Assessment: post-procedure vital signs reviewed and stable Respiratory status: spontaneous breathing, nonlabored ventilation, respiratory function stable and patient connected to nasal cannula oxygen Cardiovascular status: blood pressure returned to baseline and stable Postop Assessment: no apparent nausea or vomiting Anesthetic complications: no   No notable events documented.  Last Vitals:  Vitals:   08/12/22 1710 08/12/22 1725  BP: (!) 159/75 (!) 160/78  Pulse: 88 88  Resp: 12 16  Temp: 36.8 C 37.1 C  SpO2: 98% 100%                    Audry Pili

## 2022-08-12 NOTE — Progress Notes (Signed)
Pt received in unit for dialysis on droplet asnd enteric precautions, pt is alert and stable for tx, has some pain from procedure which is improving, pain medication admin by floor RN.   08/12/22 1940  Pain Assessment  Pain Scale 0-10  Pain Score 3  Pain Type Surgical pain  Pain Intervention(s) Repositioned;Emotional support  Fistula / Graft Right Upper arm Arteriovenous vein graft  No Placement Date or Time found.   Placed prior to admission: Yes  Orientation: Right  Access Location: Upper arm  Access Type: Arteriovenous vein graft  Site Condition No complications  Fistula / Graft Assessment Present;Thrill;Bruit  Neurological  Level of Consciousness Alert  Orientation Level Oriented X4  Respiratory  Respiratory Pattern Regular;Unlabored  Chest Assessment Chest expansion symmetrical  Bilateral Breath Sounds Clear;Diminished  R Upper  Breath Sounds Clear  L Upper Breath Sounds Clear  R Lower Breath Sounds Diminished  L Lower Breath Sounds Diminished  Cough None  Cardiac  Pulse Regular  ECG Monitor Yes  Cardiac Rhythm NSR

## 2022-08-12 NOTE — Op Note (Signed)
    Patient name: VASHON ARCH MRN: [id number] DOB: 1976/05/29 Sex: female  08/12/2022 Pre-operative Diagnosis: esrd, infected left arm av graft Post-operative diagnosis:  Same Surgeon:  Eda Paschal. Donzetta Matters, MD Assistant: Leontine Locket, PA Procedure Performed: 1.  Excision of left arm axillary AV loop graft 2.  Harvest left basilic vein 3.  Vein patch angioplasty left axillary artery  Indications: 47 year old female with history of end-stage renal disease with a functioning right arm AV graft and a nonfunctioning left arm AV graft which now appears infected secondary to MSSA bacteremia.  She is now indicated for graft excision.  Findings: There was significant purulence at the site of swelling with infected pseudoaneurysm and cultures were sent.  The entire graft was excised and I evaluated the arm with ultrasound to ensure there was no remaining graft and the axillary artery was patched with harvested basilic vein and completion there was a palpable radial artery pulse at the wrist confirmed with Doppler.   Procedure:  The patient was identified in the holding area and taken to the operating room where initially LMA anesthesia was induced but unfortunately patient did not tolerate this and she was emergently converted to endotracheal anesthesia by our anesthesia colleagues.  She did tolerate this well and ultimately she was stabilized.  We then sterilely prepped and draped the left upper extremity usual fashion, antibiotics were administered and a timeout was called.  I began by opening the edematous area of pseudoaneurysm cavity and I encountered purulence and this was sent for culture.  I then excised through 2 additional incisions the entirety of the graft and I searched the upper arm with ultrasound to ensure there was no remaining graft.  At the venous anastomosis the vein was sclerotic and the graft was just removed including the anastomosis.  At the arterial anastomosis mobilized the artery  proximally and distally and clamped it and then remove the entire the graft.  Through the same incision I identified the basilic vein and harvested this for approximately 5 cm and tied off at both ends.  The artery was then flushed in both directions with heparinized saline.  The vein was spatulated and sewn in place as a patch angioplasty with 5-0 Prolene suture.  Prior completion without flushing all directions.  Upon completion there was very strong radial artery pulse at the wrist confirmed with Doppler.  We then irrigated all the wounds and obtain hemostasis.  Over the vein patch angioplasty I closed with Vicryl and then staples and the other incisions were closed with staples and wrapped with Ace wrap loosely.  Patient was then awakened from anesthesia having tolerated procedure well without immediate complication.  Counts were correct at completion.  Specimen: Aerobic anaerobic culture  EBL: 250cc   Robinette Esters C. Donzetta Matters, MD [facility] Office: [phone] Pager: [phone]

## 2022-08-12 NOTE — Interval H&P Note (Signed)
History and Physical Interval Note:  08/12/2022 1:19 PM  [name]  has presented today for surgery, with the diagnosis of VASCULAR ACCESS.  The various methods of treatment have been discussed with the patient and family. After consideration of risks, benefits and other options for treatment, the patient has consented to  Procedure(s): LEFT ARM ARTERIOVENOUS GRAFT EXCISION (Left) as a surgical intervention.  The patient's history has been reviewed, patient examined, no change in status, stable for surgery.  I have reviewed the patient's chart and labs.  Questions were answered to the patient's satisfaction.     Servando Snare

## 2022-08-12 NOTE — Progress Notes (Signed)
Needed urine preg prior to surgery today. Patient anuric with ESRD stating "there is no way that I can be pregnant". Anesthesia made aware and stated no need for pregnancy test at this time.

## 2022-08-12 NOTE — Anesthesia Procedure Notes (Signed)
Procedure Name: Intubation Date/Time: 08/12/2022 2:38 PM  Performed by: Audry Pili, MDPre-anesthesia Checklist: Patient identified, Emergency Drugs available, Suction available and Patient being monitored Patient Re-evaluated:Patient Re-evaluated prior to induction Oxygen Delivery Method: Circle system utilized Induction Type: IV induction Laryngoscope Size: Miller and 2 Grade View: Grade II Tube type: Oral Tube size: 7.5 mm Number of attempts: 2 Airway Equipment and Method: Stylet and Oral airway Placement Confirmation: ETT inserted through vocal cords under direct vision, positive ETCO2 and breath sounds checked- equal and bilateral Secured at: 22 cm Tube secured with: Tape Dental Injury: Teeth and Oropharynx as per pre-operative assessment  Comments: Patient coughing prior to start of procedure, a while after successful LMA placement. Upon arrival to OR, SaO2 reading 40-50's, CRNA attempting to mask the patient, LMA already out. Attempted 2 handed mask ventilation with little success. Attempted DL with Miller 2, large amount of secretions, grade 3 view after suctioning. Masked patient futher, succinylcholine given. Reattempted DL with Sabra Heck 2 while waiting on glidescope, grade 2a view achieved, intubated. Hand ventilated, SaO2 quickly returned to 95+%.

## 2022-08-12 NOTE — Progress Notes (Signed)
PT Cancellation Note  Patient Details Name: [name] MRN: [id number] DOB: April 26, 1976   Cancelled Treatment:    Reason Eval/Treat Not Completed: Patient at procedure or test/unavailable. Will check back as able.   Leighton Roach, PT  Acute Rehab Services Secure chat preferred Office [place] 08/12/2022, 2:02 PM

## 2022-08-12 NOTE — Transfer of Care (Signed)
Immediate Anesthesia Transfer of Care Note  Patient: [name]  Procedure(s) Performed: LEFT ARM ARTERIOVENOUS GRAFT EXCISION WITH VEIN PATCH ANGIOPLASTY (Left: Arm Upper)  Patient Location: PACU  Anesthesia Type:General  Level of Consciousness: drowsy and patient cooperative  Airway & Oxygen Therapy: Patient Spontanous Breathing and Patient connected to face mask oxygen  Post-op Assessment: Report given to RN and Post -op Vital signs reviewed and stable  Post vital signs: Reviewed and stable  Last Vitals:  Vitals Value Taken Time  BP 168/74 08/12/22 1622  Temp    Pulse 90 08/12/22 1628  Resp 14 08/12/22 1628  SpO2 85 % 08/12/22 1628  Vitals shown include unvalidated device data.  Last Pain:  Vitals:   08/12/22 0859  TempSrc: Oral  PainSc:          Complications: No notable events documented.

## 2022-08-12 NOTE — Anesthesia Preprocedure Evaluation (Signed)
Anesthesia Evaluation  Patient identified by MRN, date of birth, ID band Patient awake    Reviewed: Allergy & Precautions, NPO status , Patient's Chart, lab work & pertinent test results  Airway Mallampati: II  TM Distance: >3 FB Neck ROM: Full    Dental   Pulmonary pneumonia, Patient abstained from smoking.   breath sounds clear to auscultation       Cardiovascular hypertension, Pt. on medications + Peripheral Vascular Disease and +CHF   Rhythm:Regular Rate:Normal     Neuro/Psych negative neurological ROS     GI/Hepatic Neg liver ROS,GERD  ,,  Endo/Other  diabetes, Type 1, Insulin Dependent    Renal/GU ESRF and DialysisRenal disease     Musculoskeletal  (+) Arthritis ,    Abdominal   Peds  Hematology  (+) Blood dyscrasia, anemia   Anesthesia Other Findings   Reproductive/Obstetrics                             Anesthesia Physical Anesthesia Plan  ASA: 4  Anesthesia Plan: General   Post-op Pain Management: Tylenol PO (pre-op)*   Induction: Intravenous  PONV Risk Score and Plan: 3 and Dexamethasone, Ondansetron and Treatment may vary due to age or medical condition  Airway Management Planned: LMA  Additional Equipment:   Intra-op Plan:   Post-operative Plan: Extubation in OR  Informed Consent: I have reviewed the patients History and Physical, chart, labs and discussed the procedure including the risks, benefits and alternatives for the proposed anesthesia with the patient or authorized representative who has indicated his/her understanding and acceptance.     Dental advisory given  Plan Discussed with: CRNA  Anesthesia Plan Comments:        Anesthesia Quick Evaluation

## 2022-08-12 NOTE — Progress Notes (Signed)
[facility] Infectious Diseases Follow Up Note  Patient Identification: Patient Name: [name] MRN: [id number] Ages Date: 08/06/2022 11:20 AM Age: 47 y.o.Today's Date: 08/12/2022  Reason for Visit: MSSA bacteremia  Active Problems:   Essential hypertension, benign   DM (diabetes mellitus) ([place])   ESRD on hemodialysis ([place])   History of CVA (cerebrovascular accident)   Staphylococcus aureus bacteremia   Pneumonia, staphylococcal ([place])   Infection of AV graft for dialysis ([place])  Antibiotics:  Doxycycline 1/17, Azithromycin 1/18-1/19 Ceftriaxone 1/17- 1/19 Cefazolin 1/18, cefazolin 1/20-c   Lines/Hardware: Rt UE AV graft , left UE AV graft ( not working)  Interval Events: remains afebrile, plan for left AVG removal today with vascular. Orthopantogram + for dental caries    Assessment 47 year old female with PMH as below including ESRD on HD via rt  upper extremity AV graft, type I DM, HTN, streptococcus vestibularis bacteremia in 01/2020( treated with 2 weeks of IV cefazolin)  to the ED on 1/17 with shortness of breath/?cough . Admitted with:    # MSSA bacteremia r/o endocarditis  - Repeat blood cx 1/18 NG in 5 days - TTE 1/19 no vegetations or concerns for endocarditis    # Infected Left AV Graft - evaluated by Vascular and plan for removal in OR today   # Acute hypoxic respiratory failure 2/2 pna, likely staph aureus related, moderate rt pleural effusion - resolved, back to RA with no respiratory complaints.   # Transaminitis - in the setting of sepsis, improving, 1/17 acute hepatitis panel negative  # Thrombocytopenia - in the setting of sepsis, improving  Recommendations Continue cefazolin.  TEE planned on 1/26 [facility] OR note and recommendations  Monitor CBC and BMP  Anticipate prolonged IV abtx  Following   Rest of the management as per the primary team. Thank you for the consult. Please page with  pertinent questions or concerns.  ______________________________________________________________________ Subjective Patient could not be seen as patient was in OR for left AV graft explantation  Vitals BP (!) 153/71 (BP Location: Left Arm)   Pulse 78   Temp 97.8 F (36.6 C) (Oral)   Resp 17   Ht 5\' 3"  (1.6 m)   Wt 54 kg   SpO2 97%   BMI 21.09 kg/m    Pertinent Microbiology Results for orders placed or performed during the hospital encounter of 08/06/22  Resp panel by RT-PCR (RSV, Flu A&B, Covid) Anterior Nasal Swab     Status: None   Collection Time: 08/06/22 11:50 AM   Specimen: Anterior Nasal Swab  Result Value Ref Range Status   SARS Coronavirus 2 by RT PCR NEGATIVE NEGATIVE Final    Comment: (NOTE) SARS-CoV-2 target nucleic acids are NOT DETECTED.  The SARS-CoV-2 RNA is generally detectable in upper respiratory specimens during the acute phase of infection. The lowest concentration of SARS-CoV-2 viral copies this assay can detect is 138 copies/mL. A negative result does not preclude SARS-Cov-2 infection and should not be used as the sole basis for treatment or other patient management decisions. A negative result may occur with  improper specimen collection/handling, submission of specimen other than nasopharyngeal swab, presence of viral mutation(s) within the areas targeted by this assay, and inadequate number of viral copies(<138 copies/mL). A negative result must be combined with clinical observations, patient history, and epidemiological information. The expected result is Negative.  Fact Sheet for Patients:  EntrepreneurPulse.com.au  Fact Sheet for Healthcare Providers:  IncredibleEmployment.be  This test is no t yet approved or cleared  by the [place] and  has been authorized for detection and/or diagnosis of SARS-CoV-2 by FDA under an Emergency Use Authorization (EUA). This EUA will remain  in effect (meaning  this test can be used) for the duration of the COVID-19 declaration under Section 564(b)(1) of the Act, 21 U.S.C.section 360bbb-3(b)(1), unless the authorization is terminated  or revoked sooner.       Influenza A by PCR NEGATIVE NEGATIVE Final   Influenza B by PCR NEGATIVE NEGATIVE Final    Comment: (NOTE) The Xpert Xpress SARS-CoV-2/FLU/RSV plus assay is intended as an aid in the diagnosis of influenza from Nasopharyngeal swab specimens and should not be used as a sole basis for treatment. Nasal washings and aspirates are unacceptable for Xpert Xpress SARS-CoV-2/FLU/RSV testing.  Fact Sheet for Patients: EntrepreneurPulse.com.au  Fact Sheet for Healthcare Providers: IncredibleEmployment.be  This test is not yet approved or cleared by the [place] FDA and has been authorized for detection and/or diagnosis of SARS-CoV-2 by FDA under an Emergency Use Authorization (EUA). This EUA will remain in effect (meaning this test can be used) for the duration of the COVID-19 declaration under Section 564(b)(1) of the Act, 21 U.S.C. section 360bbb-3(b)(1), unless the authorization is terminated or revoked.     Resp Syncytial Virus by PCR NEGATIVE NEGATIVE Final    Comment: (NOTE) Fact Sheet for Patients: EntrepreneurPulse.com.au  Fact Sheet for Healthcare Providers: IncredibleEmployment.be  This test is not yet approved or cleared by the [place] FDA and has been authorized for detection and/or diagnosis of SARS-CoV-2 by FDA under an Emergency Use Authorization (EUA). This EUA will remain in effect (meaning this test can be used) for the duration of the COVID-19 declaration under Section 564(b)(1) of the Act, 21 U.S.C. section 360bbb-3(b)(1), unless the authorization is terminated or revoked.  Performed at [facility], [place] [address] [place], [place] [address]   Respiratory (~20 pathogens)  panel by PCR     Status: None   Collection Time: 08/06/22 11:50 AM   Specimen: Nasopharyngeal Swab; Respiratory  Result Value Ref Range Status   Adenovirus NOT DETECTED NOT DETECTED Final   Coronavirus 229E NOT DETECTED NOT DETECTED Final    Comment: (NOTE) The Coronavirus on the Respiratory Panel, DOES NOT test for the novel  Coronavirus (2019 nCoV)    Coronavirus HKU1 NOT DETECTED NOT DETECTED Final   Coronavirus NL63 NOT DETECTED NOT DETECTED Final   Coronavirus OC43 NOT DETECTED NOT DETECTED Final   Metapneumovirus NOT DETECTED NOT DETECTED Final   Rhinovirus / Enterovirus NOT DETECTED NOT DETECTED Final   Influenza A NOT DETECTED NOT DETECTED Final   Influenza B NOT DETECTED NOT DETECTED Final   Parainfluenza Virus 1 NOT DETECTED NOT DETECTED Final   Parainfluenza Virus 2 NOT DETECTED NOT DETECTED Final   Parainfluenza Virus 3 NOT DETECTED NOT DETECTED Final   Parainfluenza Virus 4 NOT DETECTED NOT DETECTED Final   Respiratory Syncytial Virus NOT DETECTED NOT DETECTED Final   Bordetella pertussis NOT DETECTED NOT DETECTED Final   Bordetella Parapertussis NOT DETECTED NOT DETECTED Final   Chlamydophila pneumoniae NOT DETECTED NOT DETECTED Final   Mycoplasma pneumoniae NOT DETECTED NOT DETECTED Final    Comment: Performed at [facility], [place]. [address] [place], [place] [address]  MRSA Next Gen by PCR, Nasal     Status: None   Collection Time: 08/07/22  2:59 AM   Specimen: Nasal Mucosa; Nasal Swab  Result Value Ref Range Status   MRSA  by PCR Next Gen NOT DETECTED NOT DETECTED Final    Comment: (NOTE) The GeneXpert MRSA Assay (FDA approved for NASAL specimens only), is one component of a comprehensive MRSA colonization surveillance program. It is not intended to diagnose MRSA infection nor to guide or monitor treatment for MRSA infections. Test performance is not FDA approved in patients less than 9 years old. Performed at [facility], [place] [address],  [place], [place] [address]   Culture, blood (Routine X 2) w Reflex to ID Panel     Status: None   Collection Time: 08/07/22  4:17 PM   Specimen: BLOOD LEFT FOREARM  Result Value Ref Range Status   Specimen Description BLOOD LEFT FOREARM  Final   Special Requests   Final    BOTTLES DRAWN AEROBIC ONLY Blood Culture adequate volume   Culture   Final    NO GROWTH 5 DAYS Performed at [facility], [place] [address], [place], [place] [address]    Report Status 08/12/2022 FINAL  Final  Culture, blood (Routine X 2) w Reflex to ID Panel     Status: None   Collection Time: 08/07/22  4:18 PM   Specimen: BLOOD LEFT HAND  Result Value Ref Range Status   Specimen Description BLOOD LEFT HAND  Final   Special Requests   Final    BOTTLES DRAWN AEROBIC AND ANAEROBIC Blood Culture adequate volume   Culture   Final    NO GROWTH 5 DAYS Performed at [facility], [place] [address] [place], [place] [address]    Report Status 08/12/2022 FINAL  Final   Pertinent Lab.    Latest Ref Rng & Units 08/12/2022   10:56 AM 08/09/2022    3:13 AM 08/08/2022    3:39 AM  CBC  WBC 4.0 - 10.5 K/uL  8.0  6.8   Hemoglobin 12.0 - 15.0 g/dL 10.2  9.2  9.2   Hematocrit 36.0 - 46.0 % 30.0  28.0  27.9   Platelets 150 - 400 K/uL  109  104       Latest Ref Rng & Units 08/12/2022   10:56 AM 08/09/2022    3:13 AM 08/08/2022    3:39 AM  CMP  Glucose 70 - 99 mg/dL 153  173  154   BUN 6 - 20 mg/dL 43  49  39   Creatinine 0.44 - 1.00 mg/dL 9.80  8.41  6.94   Sodium 135 - 145 mmol/L 137  132  134   Potassium 3.5 - 5.1 mmol/L 3.7  3.7  3.7   Chloride 98 - 111 mmol/L 101  91  91   CO2 22 - 32 mmol/L  22  25   Calcium 8.9 - 10.3 mg/dL  9.1  8.9   Total Protein 6.5 - 8.1 g/dL  6.6  6.3   Total Bilirubin 0.3 - 1.2 mg/dL  0.6  1.0   Alkaline Phos 38 - 126 U/L  63  53   AST 15 - 41 U/L  117  160   ALT 0 - 44 U/L  47  152     Pertinent Imaging today Plain films and CT images have been personally visualized and interpreted;  radiology reports have been reviewed. Decision making incorporated into the Impression / Recommendations.  DG Orthopantogram  Result Date: 08/12/2022 CLINICAL DATA:  Left jaw pain EXAM: ORTHOPANTOGRAM/PANORAMIC COMPARISON:  02/13/2020 FINDINGS: No mandibular fracture or dislocation. No lytic or blastic bone lesion. Visualized paranasal sinuses  are clear. Teeth 1, 2 14, 16, 17, and 29-32 are absent. Dental caries noted within the posterior crown of tooth 18 and likely within the base of tooth 5. No periapical lucency identified. IMPRESSION: 1. No mandibular fracture or dislocation. 2. Dental caries as described above. Electronically Signed   By: Fidela Salisbury M.D.   On: 08/12/2022 04:05     I spent 51 minutes for this patient encounter including review of prior medical records, coordination of care with primary/other specialist with greater than 50% of time being face to face/counseling and discussing diagnostics/treatment plan with the patient/family.  Electronically signed by:   Rosiland Oz, MD Infectious Disease Physician [facility] Pager: [phone]

## 2022-08-13 ENCOUNTER — Encounter (HOSPITAL_COMMUNITY): Payer: Self-pay | Admitting: Vascular Surgery

## 2022-08-13 DIAGNOSIS — R7881 Bacteremia: Secondary | ICD-10-CM | POA: Diagnosis not present

## 2022-08-13 DIAGNOSIS — Z8673 Personal history of transient ischemic attack (TIA), and cerebral infarction without residual deficits: Secondary | ICD-10-CM | POA: Diagnosis not present

## 2022-08-13 DIAGNOSIS — E1059 Type 1 diabetes mellitus with other circulatory complications: Secondary | ICD-10-CM | POA: Diagnosis not present

## 2022-08-13 DIAGNOSIS — I10 Essential (primary) hypertension: Secondary | ICD-10-CM | POA: Diagnosis not present

## 2022-08-13 LAB — CBC WITH DIFFERENTIAL/PLATELET
Abs Immature Granulocytes: 0 10*3/uL (ref 0.00–0.07)
Basophils Absolute: 0 10*3/uL (ref 0.0–0.1)
Basophils Relative: 0 %
Eosinophils Absolute: 0.1 10*3/uL (ref 0.0–0.5)
Eosinophils Relative: 1 %
HCT: 31 % — ABNORMAL LOW (ref 36.0–46.0)
Hemoglobin: 10.2 g/dL — ABNORMAL LOW (ref 12.0–15.0)
Lymphocytes Relative: 4 %
Lymphs Abs: 0.3 10*3/uL — ABNORMAL LOW (ref 0.7–4.0)
MCH: 27.5 pg (ref 26.0–34.0)
MCHC: 32.9 g/dL (ref 30.0–36.0)
MCV: 83.6 fL (ref 80.0–100.0)
Monocytes Absolute: 0.4 10*3/uL (ref 0.1–1.0)
Monocytes Relative: 5 %
Neutro Abs: 6.8 10*3/uL (ref 1.7–7.7)
Neutrophils Relative %: 90 %
Platelets: 353 10*3/uL (ref 150–400)
RBC: 3.71 MIL/uL — ABNORMAL LOW (ref 3.87–5.11)
RDW: 18.3 % — ABNORMAL HIGH (ref 11.5–15.5)
WBC: 7.5 10*3/uL (ref 4.0–10.5)
nRBC: 0 % (ref 0.0–0.2)
nRBC: 0 /100 WBC

## 2022-08-13 LAB — BASIC METABOLIC PANEL
Anion gap: 27 — ABNORMAL HIGH (ref 5–15)
BUN: 37 mg/dL — ABNORMAL HIGH (ref 6–20)
CO2: 17 mmol/L — ABNORMAL LOW (ref 22–32)
Calcium: 8.9 mg/dL (ref 8.9–10.3)
Chloride: 88 mmol/L — ABNORMAL LOW (ref 98–111)
Creatinine, Ser: 6.69 mg/dL — ABNORMAL HIGH (ref 0.44–1.00)
GFR, Estimated: 7 mL/min — ABNORMAL LOW (ref >60)
Glucose, Bld: 388 mg/dL — ABNORMAL HIGH (ref 70–99)
Potassium: 4.6 mmol/L (ref 3.5–5.1)
Sodium: 132 mmol/L — ABNORMAL LOW (ref 135–145)

## 2022-08-13 LAB — GLUCOSE, CAPILLARY
Glucose-Capillary: 247 mg/dL — ABNORMAL HIGH (ref 70–99)
Glucose-Capillary: 289 mg/dL — ABNORMAL HIGH (ref 70–99)
Glucose-Capillary: 327 mg/dL — ABNORMAL HIGH (ref 70–99)
Glucose-Capillary: 357 mg/dL — ABNORMAL HIGH (ref 70–99)
Glucose-Capillary: 388 mg/dL — ABNORMAL HIGH (ref 70–99)
Glucose-Capillary: 435 mg/dL — ABNORMAL HIGH (ref 70–99)

## 2022-08-13 LAB — RENAL FUNCTION PANEL
Albumin: 3 g/dL — ABNORMAL LOW (ref 3.5–5.0)
Anion gap: 18 — ABNORMAL HIGH (ref 5–15)
BUN: 21 mg/dL — ABNORMAL HIGH (ref 6–20)
CO2: 22 mmol/L (ref 22–32)
Calcium: 9.1 mg/dL (ref 8.9–10.3)
Chloride: 94 mmol/L — ABNORMAL LOW (ref 98–111)
Creatinine, Ser: 4.92 mg/dL — ABNORMAL HIGH (ref 0.44–1.00)
GFR, Estimated: 11 mL/min — ABNORMAL LOW (ref >60)
Glucose, Bld: 231 mg/dL — ABNORMAL HIGH (ref 70–99)
Phosphorus: 4.3 mg/dL (ref 2.5–4.6)
Potassium: 4.2 mmol/L (ref 3.5–5.1)
Sodium: 134 mmol/L — ABNORMAL LOW (ref 135–145)

## 2022-08-13 LAB — HEMOGLOBIN A1C
Hgb A1c MFr Bld: 6.4 % — ABNORMAL HIGH (ref 4.8–5.6)
Mean Plasma Glucose: 136.98 mg/dL

## 2022-08-13 LAB — MAGNESIUM: Magnesium: 1.8 mg/dL (ref 1.7–2.4)

## 2022-08-13 MED ORDER — ALTEPLASE 2 MG IJ SOLR: 2.0000 mg | Freq: Once | INTRAMUSCULAR | Status: DC | PRN

## 2022-08-13 MED ORDER — PENTAFLUOROPROP-TETRAFLUOROETH EX AERO: 1.0000 | INHALATION_SPRAY | CUTANEOUS | Status: DC | PRN

## 2022-08-13 MED ORDER — INSULIN GLARGINE-YFGN 100 UNIT/ML ~~LOC~~ SOLN
5.0000 [IU] | Freq: Every day | SUBCUTANEOUS | Status: DC
  Administered 2022-08-13 – 2022-08-15 (×3): 5 [IU] via SUBCUTANEOUS
  Filled 2022-08-13 (×3): qty 0.05

## 2022-08-13 MED ORDER — INSULIN ASPART 100 UNIT/ML IJ SOLN: 0.0000 [IU] | Freq: Three times a day (TID) | INTRAMUSCULAR | Status: DC

## 2022-08-13 MED ORDER — LIDOCAINE-PRILOCAINE 2.5-2.5 % EX CREA: 1.0000 | TOPICAL_CREAM | CUTANEOUS | Status: DC | PRN

## 2022-08-13 MED ORDER — INSULIN ASPART 100 UNIT/ML IJ SOLN
0.0000 [IU] | Freq: Every day | INTRAMUSCULAR | Status: DC
  Administered 2022-08-14: 3 [IU] via SUBCUTANEOUS

## 2022-08-13 MED ORDER — INSULIN ASPART 100 UNIT/ML IJ SOLN
0.0000 [IU] | INTRAMUSCULAR | Status: DC
  Administered 2022-08-13: 4 [IU] via SUBCUTANEOUS
  Administered 2022-08-13: 5 [IU] via SUBCUTANEOUS
  Administered 2022-08-13: 3 [IU] via SUBCUTANEOUS
  Administered 2022-08-14: 1 [IU] via SUBCUTANEOUS

## 2022-08-13 MED ORDER — HEPARIN SODIUM (PORCINE) 1000 UNIT/ML DIALYSIS: 1000.0000 [IU] | INTRAMUSCULAR | Status: DC | PRN

## 2022-08-13 MED ORDER — LIDOCAINE HCL (PF) 1 % IJ SOLN: 5.0000 mL | INTRAMUSCULAR | Status: DC | PRN

## 2022-08-13 MED ORDER — ONDANSETRON HCL 4 MG/2ML IJ SOLN
4.0000 mg | Freq: Four times a day (QID) | INTRAMUSCULAR | Status: DC | PRN
  Administered 2022-08-13: 4 mg via INTRAVENOUS
  Filled 2022-08-13: qty 2

## 2022-08-13 MED ORDER — ANTICOAGULANT SODIUM CITRATE 4% (200MG/5ML) IV SOLN: 5.0000 mL | Status: DC | PRN

## 2022-08-13 NOTE — Inpatient Diabetes Management (Addendum)
Inpatient Diabetes Program Recommendations  AACE/ADA: New Consensus Statement on Inpatient Glycemic Control (2015)  Target Ranges:  Prepandial:   less than 140 mg/dL      Peak postprandial:   less than 180 mg/dL (1-2 hours)      Critically ill patients:  140 - 180 mg/dL   Lab Results  Component Value Date   GLUCAP 435 (H) 08/13/2022   HGBA1C 6.4 (H) 08/13/2022    Review of Glycemic Control  Latest Reference Range & Units 08/12/22 16:25 08/12/22 19:32 08/12/22 22:37 08/13/22 02:16 08/13/22 08:16 08/13/22 11:52  Glucose-Capillary 70 - 99 mg/dL 256 (H) 300 (H) 160 (H) 247 (H) 357 (H) 435 (H)  (H): Data is abnormally high   Latest Reference Range & Units 08/13/22 14:21  CO2 22 - 32 mmol/L 17 (L)  (L): Data is abnormally low  Diabetes history: DM1(does not make insulin.  Needs correction, basal and meal coverage)  Outpatient Diabetes medications:  T-Slim insulin Pump Basal 12a-3a- 0.26 units/hr 3a-9a-0.35 units/hr 9a-7p0.25 units/hr 7p-12a- 0.23 units/hr Total daily basal 6.53 units  Correction factor 12a-12a 1 unit drops her 80 points  CHO ratio: 12a-3a-1 unit for every 22 CHO 3a-9a-1 unit for every 26 CHO 9a-7p-1 unit for every 26 CHO 9p-12a-1 units for every 27 CHO  Target BG 120 Current orders for Inpatient glycemic control: Pump, Received 5 mg decadron yesterday  Inpatient Diabetes Program Recommendations:    1-Semglee 5 units QD (please administer asap) 2-Novolog 0-6 units Q4H until glucose is <200 mg/dL 3-Novolog 2 units TID with meals if she consumes at least 50%  Spoke with patient at bedside.  CBGs elevated today.  Her insulin pump has not been changed since last Wednsday (1 week ago).  She wears a dexcom G6 and has a prescription for the G7.  Sees endo at [facility], Autumn Jones.    Notified Dr. Karleen Hampshire of recommendations.  Placed Dexcom G7 on her right arm with dr. Cristopher Estimable permission.  Sent [place] to RN, [place] to administer basal asap.    Will continue to follow  while inpatient.  Thank you, Reche Dixon, MSN, [place] Diabetes Coordinator Inpatient Diabetes Program [phone] (team pager from 8a-5p)

## 2022-08-13 NOTE — Progress Notes (Signed)
PT Cancellation Note  Patient Details Name: [name] MRN: [id number] DOB: 14-Oct-1975   Cancelled Treatment:    Reason Eval/Treat Not Completed: Medical issues which prohibited therapy; patient relates she has been vomiting every time she gets up since surgery.  States had antinausea meds, but prefers not to walk.  States no longer needs the walker as her foot is better, asking if can be returned and insurance refunded.  PT will contact RNCM.  Will attempt another day.   Reginia Naas 08/13/2022, 3:16 PM Magda Kiel, PT Acute Rehabilitation Services Office:[phone] 08/13/2022

## 2022-08-13 NOTE — Progress Notes (Addendum)
  Progress Note    08/13/2022 7:17 AM 1 Day Post-Op  Subjective:  no complaints  afebrile  Vitals:   08/13/22 0046 08/13/22 0304  BP: (!) 146/64 135/60  Pulse: 79 87  Resp: 18 16  Temp: 97.6 F (36.4 C) 97.9 F (36.6 C)  SpO2: 99% 91%    Physical Exam: General:  no distress; sleeping and wakes easily Cardiac:  regular Lungs:  non labored Incisions:  left arm incisions are clean with staples in tact with scant bloody drainage from lateral incision Extremities:  easily palpable left radial pulse   CBC    Component Value Date/Time   WBC 7.5 08/13/2022 0141   RBC 3.71 (L) 08/13/2022 0141   HGB 10.2 (L) 08/13/2022 0141   HCT 31.0 (L) 08/13/2022 0141   PLT 353 08/13/2022 0141   MCV 83.6 08/13/2022 0141   MCH 27.5 08/13/2022 0141   MCHC 32.9 08/13/2022 0141   RDW 18.3 (H) 08/13/2022 0141   LYMPHSABS 0.3 (L) 08/13/2022 0141   MONOABS 0.4 08/13/2022 0141   EOSABS 0.1 08/13/2022 0141   BASOSABS 0.0 08/13/2022 0141    BMET    Component Value Date/Time   NA 134 (L) 08/13/2022 0141   K 4.2 08/13/2022 0141   CL 94 (L) 08/13/2022 0141   CO2 22 08/13/2022 0141   GLUCOSE 231 (H) 08/13/2022 0141   BUN 21 (H) 08/13/2022 0141   CREATININE 4.92 (H) 08/13/2022 0141   CALCIUM 9.1 08/13/2022 0141   CALCIUM 10.2 12/03/2009 1101   GFRNONAA 11 (L) 08/13/2022 0141   GFRAA 9 (L) 02/14/2020 0116    INR    Component Value Date/Time   INR 1.1 10/21/2019 0354     Intake/Output Summary (Last 24 hours) at 08/13/2022 0717 Last data filed at 08/12/2022 2355 Gross per 24 hour  Intake 1050 ml  Output 2000 ml  Net -950 ml   Component 1 d ago  Specimen Description WOUND LEFT ARM  Special Requests AV GRAFT  Gram Stain RARE WBC PRESENT,BOTH PMN AND MONONUCLEAR RARE GRAM POSITIVE COCCI Performed at [facility], [address] [place], [place] [address]  Culture PENDING  Report Status PENDING     Assessment/Plan:  47 y.o. female is s/p:  1.  Excision of left arm  axillary AV loop graft 2.  Harvest left basilic vein 3.  Vein patch angioplasty left axillary artery  1 Day Post-Op   -pt with palpable left radial pulse  -dressings removed and incisions look good with staples in tact.  There is scant bloody drainage from the lateral incision.  Arm redressed with 3" ace starting at hand (to prevent hand swelling) to axilla -wound cx as above with rare GPC on gram stain.  Pt receiving Ancef-await final cultures    Leontine Locket, PA-C [facility] [phone] 08/13/2022 7:17 AM  I have independently interviewed and examined patient and agree with PA assessment and plan above. She has normal left radial pulse and mild pain.   Libbie Bartley C. Donzetta Matters, MD [facility] [place] Office: [phone] Pager: [phone]

## 2022-08-13 NOTE — Progress Notes (Signed)
[facility]                                                                               [name], is a 47 y.o. female, DOB - 14-Oct-1975, IRC:[id number] Admit date - 08/06/2022    Outpatient Primary MD for the patient is Osei-Bonsu, Iona Beard, MD  LOS - 5  days    Brief summary   [name] is a 47 yo female with PMH ESRD on HD, HTN, HLD, anxiety, DM II, CVA who presented with shortness of breath, weakness.  CXR on admission was concerning for right middle lobe opacity consistent with pneumonia.  She was started on Rocephin and azithromycin. After admission, we were informed that outpatient blood cultures from 08/05/2022 became positive for GPC, 4/4 bottles which have now speciated to MSSA. Cultures were obtained due to chills she developed during dialysis session.  She has Ongoing tenderness and swelling in her left arm at site of prior graft placement.  She underwent  entire excision  of the left arm axillary AV loop graft and vein patch angioplasty of the left axillary artery on 08/12/2022 with vascular surgery.  She is scheduled for TEE later this week.   Assessment & Plan    Assessment and Plan:  MSSA bacteremia:  Currently on IV ancef.  TTE is negative for vegetations.  TEE scheduled for 1/26.  ID on board and appreciate recommendations.     Infected LUE graft:  Vascular surgery consulted. She underwent  entire excision  of the left arm axillary AV loop graft and vein patch angioplasty of the left axillary artery on 08/12/2022 with vascular surgery.     Acute respiratory failure with hypoxia from CAP;  Resolved. She is on RA .    ESRD on HD;  Further management as per nephrology.     Right ankle pain:  CT right ankle obtained to rule out septic joint; mostly shows arthritis changes  Resolved.   Insulin dependent DM:  Currently on insulin pump.  Elevated CBG ths afternoon. BMP ordered.    H/o CVA:  Resume aspirin.    Hypertension:   Well controlled. On amlodipine and labetalol.    Thrombocytopenia:  Resolved.    Transaminitis:  Improving. Check liver panel in am.    Nausea and vomiting today:  Unclear etiology.  Elevated blood sugar in 400's. Check BMP .     Estimated body mass index is 22.57 kg/m as calculated from the following:   Height as of this encounter: 5\' 3"  (1.6 m).   Weight as of this encounter: 57.8 kg.  Code Status: full code.  DVT Prophylaxis:  SCDs Start: 08/06/22 1722   Level of Care: Level of care: Med-Surg Family Communication: none at bedside.   Disposition Plan:     Remains inpatient appropriate:  IV antibiotics and TEE on firday.   Procedures:  Excision of the AV graft on the LUE  Consultants:   Nephrology Vascular surgery.   Antimicrobials:   Anti-infectives (From admission, onward)    Start     Dose/Rate Route Frequency Ordered Stop   08/12/22 1800  ceFAZolin (ANCEF) IVPB 2g/100 mL premix  2 g 200 mL/hr over 30 Minutes Intravenous Every T-Th-Sa (1800) 08/11/22 0810     08/09/22 1800  vancomycin (VANCOREADY) IVPB 500 mg/100 mL  Status:  Discontinued        500 mg 100 mL/hr over 60 Minutes Intravenous Every T-Th-Sa (1800) 08/07/22 1743 08/09/22 1742   08/09/22 1800  ceFAZolin (ANCEF) IVPB 2g/100 mL premix  Status:  Discontinued        2 g 200 mL/hr over 30 Minutes Intravenous Every T-Th-Sa (Hemodialysis) 08/09/22 1742 08/11/22 0810   08/08/22 1000  cefTRIAXone (ROCEPHIN) 2 g in sodium chloride 0.9 % 100 mL IVPB  Status:  Discontinued        2 g 200 mL/hr over 30 Minutes Intravenous Every 24 hours 08/07/22 1805 08/08/22 1346   08/07/22 1830  vancomycin (VANCOCIN) IVPB 1000 mg/200 mL premix        1,000 mg 200 mL/hr over 60 Minutes Intravenous  Once 08/07/22 1743 08/07/22 1925   08/07/22 1600  ceFAZolin (ANCEF) IVPB 2g/100 mL premix        2 g 200 mL/hr over 30 Minutes Intravenous  Once 08/07/22 1458 08/07/22 1648   08/07/22 1000  cefTRIAXone (ROCEPHIN) 2 g  in sodium chloride 0.9 % 100 mL IVPB  Status:  Discontinued        2 g 200 mL/hr over 30 Minutes Intravenous Every 24 hours 08/06/22 1726 08/06/22 1726   08/07/22 1000  azithromycin (ZITHROMAX) 250 mg in dextrose 5 % 125 mL IVPB  Status:  Discontinued        250 mg 127.5 mL/hr over 60 Minutes Intravenous Every 24 hours 08/06/22 1726 08/08/22 1346   08/07/22 1000  cefTRIAXone (ROCEPHIN) 1 g in sodium chloride 0.9 % 100 mL IVPB  Status:  Discontinued        1 g 200 mL/hr over 30 Minutes Intravenous Every 24 hours 08/06/22 1726 08/07/22 1458   08/07/22 0000  amoxicillin (AMOXIL) 875 MG tablet  Status:  Discontinued        875 mg Oral 2 times daily 08/07/22 1133 08/07/22    08/07/22 0000  doxycycline (VIBRAMYCIN) 100 MG capsule        100 mg Oral 2 times daily 08/07/22 1133 08/12/22 2359   08/07/22 0000  amoxicillin (AMOXIL) 500 MG tablet        500 mg Oral 2 times daily 08/07/22 1412 08/12/22 2359   08/06/22 1645  cefTRIAXone (ROCEPHIN) 1 g in sodium chloride 0.9 % 100 mL IVPB        1 g 200 mL/hr over 30 Minutes Intravenous  Once 08/06/22 1637 08/06/22 1840   08/06/22 1645  doxycycline (VIBRA-TABS) tablet 100 mg        100 mg Oral  Once 08/06/22 1637 08/06/22 1744        Medications  Scheduled Meds:  acetaminophen  1,000 mg Oral Q6H   amLODipine  10 mg Oral Daily   aspirin EC  81 mg Oral Daily   Chlorhexidine Gluconate Cloth  6 each Topical Q0600   insulin aspart  0-15 Units Subcutaneous TID WC   insulin pump   Subcutaneous TID WC, HS, 0200   labetalol  300 mg Oral Daily   sodium chloride flush  3 mL Intravenous Q12H   Continuous Infusions:  sodium chloride Stopped (08/12/22 1618)   anticoagulant sodium citrate      ceFAZolin (ANCEF) IV Stopped (08/12/22 1839)   PRN Meds:.alteplase, anticoagulant sodium citrate, heparin, HYDROmorphone (DILAUDID) injection, lidocaine (PF), lidocaine-prilocaine, ondansetron (  ZOFRAN) IV, oxyCODONE, pentafluoroprop-tetrafluoroeth, polyethylene  glycol    Subjective:   [name] was seen and examined today.  Slightly nauseated. Wants to know if she can eat mashed potatoes.  No chest pain or sob. One episode of vomiting.  Objective:   Vitals:   08/13/22 0046 08/13/22 0304 08/13/22 0459 08/13/22 0746  BP: (!) 146/64 135/60  (!) 125/58  Pulse: 79 87  83  Resp: 18 16  16   Temp: 97.6 F (36.4 C) 97.9 F (36.6 C)  97.7 F (36.5 C)  TempSrc: Oral Oral  Oral  SpO2: 99% 91%  92%  Weight:   57.8 kg   Height:        Intake/Output Summary (Last 24 hours) at 08/13/2022 1348 Last data filed at 08/13/2022 0900 Gross per 24 hour  Intake 1170 ml  Output 2000 ml  Net -830 ml   Filed Weights   08/11/22 0500 08/12/22 0940 08/13/22 0459  Weight: 54 kg 54 kg 57.8 kg     Exam General exam: Appears calm and comfortable  Respiratory system: Clear to auscultation. Respiratory effort normal. Cardiovascular system: S1 & S2 heard, RRR. No JVD, murmurs,  No pedal edema. Gastrointestinal system: Abdomen is nondistended, soft and nontender. Central nervous system: Alert and oriented. No focal neurological deficits. Extremities: left arm graft area bandaged.  Skin: No rashes,  Psychiatry:  Mood & affect appropriate.     Data Reviewed:  I have personally reviewed following labs and imaging studies   CBC Lab Results  Component Value Date   WBC 7.5 08/13/2022   RBC 3.71 (L) 08/13/2022   HGB 10.2 (L) 08/13/2022   HCT 31.0 (L) 08/13/2022   MCV 83.6 08/13/2022   MCH 27.5 08/13/2022   PLT 353 08/13/2022   MCHC 32.9 08/13/2022   RDW 18.3 (H) 08/13/2022   LYMPHSABS 0.3 (L) 08/13/2022   MONOABS 0.4 08/13/2022   EOSABS 0.1 08/13/2022   BASOSABS 0.0 47/65/4650     Last metabolic panel Lab Results  Component Value Date   NA 134 (L) 08/13/2022   K 4.2 08/13/2022   CL 94 (L) 08/13/2022   CO2 22 08/13/2022   BUN 21 (H) 08/13/2022   CREATININE 4.92 (H) 08/13/2022   GLUCOSE 231 (H) 08/13/2022   GFRNONAA 11 (L) 08/13/2022    GFRAA 9 (L) 02/14/2020   CALCIUM 9.1 08/13/2022   PHOS 4.3 08/13/2022   PROT 6.6 08/09/2022   ALBUMIN 3.0 (L) 08/13/2022   BILITOT 0.6 08/09/2022   ALKPHOS 63 08/09/2022   AST 117 (H) 08/09/2022   ALT 47 (H) 08/09/2022   ANIONGAP 18 (H) 08/13/2022    CBG (last 3)  Recent Labs    08/13/22 0216 08/13/22 0816 08/13/22 1152  GLUCAP 247* 357* 435*      Coagulation Profile: No results for input(s): "INR", "PROTIME" in the last 168 hours.   Radiology Studies: DG Orthopantogram  Result Date: 08/12/2022 CLINICAL DATA:  Left jaw pain EXAM: ORTHOPANTOGRAM/PANORAMIC COMPARISON:  02/13/2020 FINDINGS: No mandibular fracture or dislocation. No lytic or blastic bone lesion. Visualized paranasal sinuses are clear. Teeth 1, 2 14, 16, 17, and 29-32 are absent. Dental caries noted within the posterior crown of tooth 18 and likely within the base of tooth 5. No periapical lucency identified. IMPRESSION: 1. No mandibular fracture or dislocation. 2. Dental caries as described above. Electronically Signed   By: Fidela Salisbury M.D.   On: 08/12/2022 04:05       Hosie Poisson M.D. [facility] 08/13/2022, 1:48  PM  Available via Epic secure chat 7am-7pm After 7 pm, please refer to night coverage provider listed on amion.

## 2022-08-13 NOTE — Progress Notes (Cosign Needed Addendum)
[facility] Progress Note   Subjective:  Seen in room. Was in the OR most of the day yesterday - had old AV graft excised - significant purulence and infected pseudoaneurysm noted. Completed dialysis in the evening.  Feels ok this am. No fevers,cp, dyspnea.    Objective Vitals:   08/13/22 0046 08/13/22 0304 08/13/22 0459 08/13/22 0746  BP: (!) 146/64 135/60  (!) 125/58  Pulse: 79 87  83  Resp: 18 16  16   Temp: 97.6 F (36.4 C) 97.9 F (36.6 C)  97.7 F (36.5 C)  TempSrc: Oral Oral  Oral  SpO2: 99% 91%  92%  Weight:   57.8 kg   Height:       Physical Exam General: well appearing female in NAD Heart: RRR, no mrg Lungs: CTAB, nml WOB on RA Abdomen: soft, NTND Extremities: no LE edema: L arm in ACE wrap  Dialysis Access: RU AVG +b/t :  Filed Weights   08/11/22 0500 08/12/22 0940 08/13/22 0459  Weight: 54 kg 54 kg 57.8 kg    Intake/Output Summary (Last 24 hours) at 08/13/2022 1013 Last data filed at 08/12/2022 2355 Gross per 24 hour  Intake 1050 ml  Output 2000 ml  Net -950 ml     Additional Objective Labs: Basic Metabolic Panel: Recent Labs  Lab 08/06/22 2025 08/07/22 0458 08/08/22 0339 08/09/22 0313 08/12/22 1056 08/13/22 0141  NA  --    < > 134* 132* 137 134*  K  --    < > 3.7 3.7 3.7 4.2  CL  --    < > 91* 91* 101 94*  CO2  --    < > 25 22  --  22  GLUCOSE  --    < > 154* 173* 153* 231*  BUN  --    < > 39* 49* 43* 21*  CREATININE  --    < > 6.94* 8.41* 9.80* 4.92*  CALCIUM  --    < > 8.9 9.1  --  9.1  PHOS 7.1*  --   --  6.0*  --  4.3   < > = values in this interval not displayed.    Liver Function Tests: Recent Labs  Lab 08/07/22 0458 08/08/22 0339 08/09/22 0313 08/13/22 0141  AST 353* 160* 117*  --   ALT 266* 152* 47*  --   ALKPHOS 48 53 63  --   BILITOT 1.4* 1.0 0.6  --   PROT 6.6 6.3* 6.6  --   ALBUMIN 3.2* 3.0* 3.0* 3.0*    Recent Labs  Lab 08/06/22 1154  LIPASE 24    CBC: Recent Labs  Lab 08/06/22 1154  08/07/22 0458 08/08/22 0339 08/09/22 0313 08/12/22 1056 08/13/22 0141  WBC 7.8 8.7 6.8 8.0  --  7.5  NEUTROABS 6.3  --  5.3 6.4  --  6.8  HGB 10.1* 8.8* 9.2* 9.2* 10.2* 10.2*  HCT 32.5* 28.3* 27.9* 28.0* 30.0* 31.0*  MCV 88.3 87.9 84.3 83.8  --  83.6  PLT 66* 87* 104* 109*  --  353    CBG: Recent Labs  Lab 08/12/22 1625 08/12/22 1932 08/12/22 2237 08/13/22 0216 08/13/22 0816  GLUCAP 256* 300* 160* 247* 357*     Studies/Results: DG Orthopantogram  Result Date: 08/12/2022 CLINICAL DATA:  Left jaw pain EXAM: ORTHOPANTOGRAM/PANORAMIC COMPARISON:  02/13/2020 FINDINGS: No mandibular fracture or dislocation. No lytic or blastic bone lesion. Visualized paranasal sinuses are clear. Teeth 1, 2 14, 16, 17, and 29-32 are  absent. Dental caries noted within the posterior crown of tooth 18 and likely within the base of tooth 5. No periapical lucency identified. IMPRESSION: 1. No mandibular fracture or dislocation. 2. Dental caries as described above. Electronically Signed   By: Fidela Salisbury M.D.   On: 08/12/2022 04:05    Medications:  sodium chloride Stopped (08/12/22 1618)   anticoagulant sodium citrate      ceFAZolin (ANCEF) IV Stopped (08/12/22 1839)    acetaminophen  1,000 mg Oral Q6H   amLODipine  10 mg Oral Daily   aspirin EC  81 mg Oral Daily   Chlorhexidine Gluconate Cloth  6 each Topical Q0600   insulin pump   Subcutaneous TID WC, HS, 0200   labetalol  300 mg Oral Daily   sodium chloride flush  3 mL Intravenous Q12H      Dialysis Orders: [facility]TS  425 / 800, 2K 2 Ca, EDW 58.4 (left at 57.8 on 1/16), 3:30, AVG UFP4  venofer 50 weekly, Mircera 50q2 (given 1/16) Heparin 2K Units hectorol 2 TIW, sensipar 30 TIW     Assessment/Plan: 1. Hypoxia/CAP/pleural effusion - Improved. ID suspects likely infection related.  Plan for home O2 as of now. 2. Staph aureus Bacteremia - BC at OP center + MSSA staph aureus. Source - infected old left AVG. Removed in OR on  1/23- cultures pending.  ID following ABX changed to cefazolin. For TEE on 1/26.  4. ESRD - on HD TTS.  Continue on schedule. Next HD 1/25.  5. Anemia of CKD-  Hgb 9.2. No indication for ESA, just given on 1/16.  6. Secondary hyperparathyroidism -  Calcium in goal.  Phos elevated but improving.  Continue VDRA, binders and sensipar for now 7. HTN/volume - BP well controlled.  Continue home meds. Under EDW if weights correct, will need lower dry, standing weight post HD if able.  8. H/o right sided pleural effusion + h/o CT in the past. Moderate R pleural effusion on CXR 08/06/22.  9. Nutrition - Renal diet w/fluid restrictions. Alb 3.4. +protein supplements. 10. DM since age 31 11. PAD    Lynnda Child PA-C [facility] 08/13/2022,10:13 AM

## 2022-08-13 NOTE — Progress Notes (Signed)
Post Hemodialysis   08/12/22 2355  Vitals  Temp 98.1 F (36.7 C)  Pulse Rate 89  Resp (!) 9  BP 125/67  SpO2 97 %  O2 Device Nasal Cannula  Type of Weight Post-Dialysis  Oxygen Therapy  Patient Activity (if Appropriate) In bed  Pulse Oximetry Type Continuous  Post Treatment  Dialyzer Clearance Lightly streaked  Duration of HD Treatment -hour(s) 3.5 hour(s)  Liters Processed 84  Fluid Removed (mL) 2000 mL  Tolerated HD Treatment Yes  Post-Hemodialysis Comments Hd tx completed and tolerated well, pt denies any complaints, is stable for discharge

## 2022-08-14 DIAGNOSIS — E1059 Type 1 diabetes mellitus with other circulatory complications: Secondary | ICD-10-CM | POA: Diagnosis not present

## 2022-08-14 DIAGNOSIS — Z8673 Personal history of transient ischemic attack (TIA), and cerebral infarction without residual deficits: Secondary | ICD-10-CM | POA: Diagnosis not present

## 2022-08-14 DIAGNOSIS — B9561 Methicillin susceptible Staphylococcus aureus infection as the cause of diseases classified elsewhere: Secondary | ICD-10-CM | POA: Diagnosis not present

## 2022-08-14 DIAGNOSIS — R7881 Bacteremia: Secondary | ICD-10-CM | POA: Diagnosis not present

## 2022-08-14 DIAGNOSIS — I10 Essential (primary) hypertension: Secondary | ICD-10-CM | POA: Diagnosis not present

## 2022-08-14 LAB — GLUCOSE, CAPILLARY
Glucose-Capillary: 156 mg/dL — ABNORMAL HIGH (ref 70–99)
Glucose-Capillary: 134 mg/dL — ABNORMAL HIGH (ref 70–99)
Glucose-Capillary: 146 mg/dL — ABNORMAL HIGH (ref 70–99)
Glucose-Capillary: 121 mg/dL — ABNORMAL HIGH (ref 70–99)
Glucose-Capillary: 253 mg/dL — ABNORMAL HIGH (ref 70–99)

## 2022-08-14 LAB — HEPATIC FUNCTION PANEL
ALT: 5 U/L (ref 0–44)
AST: 26 U/L (ref 15–41)
Albumin: 2.8 g/dL — ABNORMAL LOW (ref 3.5–5.0)
Alkaline Phosphatase: 68 U/L (ref 38–126)
Bilirubin, Direct: 0.1 mg/dL (ref 0.0–0.2)
Indirect Bilirubin: 0.4 mg/dL (ref 0.3–0.9)
Total Bilirubin: 0.5 mg/dL (ref 0.3–1.2)
Total Protein: 6.8 g/dL (ref 6.5–8.1)

## 2022-08-14 LAB — BASIC METABOLIC PANEL
Anion gap: 17 — ABNORMAL HIGH (ref 5–15)
BUN: 54 mg/dL — ABNORMAL HIGH (ref 6–20)
CO2: 27 mmol/L (ref 22–32)
Calcium: 9.2 mg/dL (ref 8.9–10.3)
Chloride: 92 mmol/L — ABNORMAL LOW (ref 98–111)
Creatinine, Ser: 8.49 mg/dL — ABNORMAL HIGH (ref 0.44–1.00)
GFR, Estimated: 5 mL/min — ABNORMAL LOW (ref >60)
Glucose, Bld: 141 mg/dL — ABNORMAL HIGH (ref 70–99)
Potassium: 4.3 mmol/L (ref 3.5–5.1)
Sodium: 136 mmol/L (ref 135–145)

## 2022-08-14 MED ORDER — HEPARIN SODIUM (PORCINE) 1000 UNIT/ML DIALYSIS
2000.0000 [IU] | INTRAMUSCULAR | Status: DC | PRN
  Administered 2022-08-14: 2000 [IU] via INTRAVENOUS_CENTRAL
  Filled 2022-08-14: qty 2

## 2022-08-14 MED ORDER — HEPARIN SODIUM (PORCINE) 1000 UNIT/ML DIALYSIS: 1000.0000 [IU] | INTRAMUSCULAR | Status: DC | PRN

## 2022-08-14 NOTE — Progress Notes (Signed)
[facility]                                                                               [name], is a 47 y.o. female, DOB - July 06, 1976, GMW:[id number] Admit date - 08/06/2022    Outpatient Primary MD for the patient is Osei-Bonsu, Iona Beard, MD  LOS - 6  days    Brief summary   [name] is a 47 yo female with PMH ESRD on HD, HTN, HLD, anxiety, DM II, CVA who presented with shortness of breath, weakness.  CXR on admission was concerning for right middle lobe opacity consistent with pneumonia.  She was started on Rocephin and azithromycin. After admission, we were informed that outpatient blood cultures from 08/05/2022 became positive for GPC, 4/4 bottles which have now speciated to MSSA. Cultures were obtained due to chills she developed during dialysis session.  She has Ongoing tenderness and swelling in her left arm at site of prior graft placement.  She underwent  entire excision  of the left arm axillary AV loop graft and vein patch angioplasty of the left axillary artery on 08/12/2022 with vascular surgery.  She is scheduled for TEE Tomorrow. No new events overnight.   Assessment & Plan    Assessment and Plan:  MSSA bacteremia:  Currently on IV ancef.  TTE is negative for vegetations.  TEE scheduled for 1/26.  ID on board and appreciate recommendations.     Infected LUE graft:  Vascular surgery consulted. She underwent  entire excision  of the left arm axillary AV loop graft and vein patch angioplasty of the left axillary artery on 08/12/2022 with vascular surgery.  Incision site clean.  Follow up the cultures.     Acute respiratory failure with hypoxia from CAP;  Resolved. She is on RA .    ESRD on HD;  Further management as per nephrology.     Right ankle pain:  CT right ankle obtained to rule out septic joint; mostly shows arthritis changes  Resolved.   Insulin dependent DM:  CBG (last 3)  Recent Labs    08/14/22 0419 08/14/22 0930  08/14/22 1146  GLUCAP 156* 134* 146*   Started the patient on semglee 5 units daily and SSI.    H/o CVA:  Resume aspirin.    Hypertension:  Well controlled.Optimal BP parameters.    Thrombocytopenia:  Resolved.    Transaminitis:  Improving. Check liver panel today.    Nausea and vomiting  Resolved.      Estimated body mass index is 21.52 kg/m as calculated from the following:   Height as of this encounter: 5\' 3"  (1.6 m).   Weight as of this encounter: 55.1 kg.  Code Status: full code.  DVT Prophylaxis:  SCDs Start: 08/06/22 1722   Level of Care: Level of care: Med-Surg Family Communication: none at bedside.   Disposition Plan:     Remains inpatient appropriate:  IV antibiotics and TEE on firday.   Procedures:  Excision of the AV graft on the LUE  Consultants:   Nephrology Vascular surgery.   Antimicrobials:   Anti-infectives (From admission, onward)    Start     Dose/Rate Route  Frequency Ordered Stop   08/12/22 1800  ceFAZolin (ANCEF) IVPB 2g/100 mL premix        2 g 200 mL/hr over 30 Minutes Intravenous Every T-Th-Sa (1800) 08/11/22 0810     08/09/22 1800  vancomycin (VANCOREADY) IVPB 500 mg/100 mL  Status:  Discontinued        500 mg 100 mL/hr over 60 Minutes Intravenous Every T-Th-Sa (1800) 08/07/22 1743 08/09/22 1742   08/09/22 1800  ceFAZolin (ANCEF) IVPB 2g/100 mL premix  Status:  Discontinued        2 g 200 mL/hr over 30 Minutes Intravenous Every T-Th-Sa (Hemodialysis) 08/09/22 1742 08/11/22 0810   08/08/22 1000  cefTRIAXone (ROCEPHIN) 2 g in sodium chloride 0.9 % 100 mL IVPB  Status:  Discontinued        2 g 200 mL/hr over 30 Minutes Intravenous Every 24 hours 08/07/22 1805 08/08/22 1346   08/07/22 1830  vancomycin (VANCOCIN) IVPB 1000 mg/200 mL premix        1,000 mg 200 mL/hr over 60 Minutes Intravenous  Once 08/07/22 1743 08/07/22 1925   08/07/22 1600  ceFAZolin (ANCEF) IVPB 2g/100 mL premix        2 g 200 mL/hr over 30 Minutes  Intravenous  Once 08/07/22 1458 08/07/22 1648   08/07/22 1000  cefTRIAXone (ROCEPHIN) 2 g in sodium chloride 0.9 % 100 mL IVPB  Status:  Discontinued        2 g 200 mL/hr over 30 Minutes Intravenous Every 24 hours 08/06/22 1726 08/06/22 1726   08/07/22 1000  azithromycin (ZITHROMAX) 250 mg in dextrose 5 % 125 mL IVPB  Status:  Discontinued        250 mg 127.5 mL/hr over 60 Minutes Intravenous Every 24 hours 08/06/22 1726 08/08/22 1346   08/07/22 1000  cefTRIAXone (ROCEPHIN) 1 g in sodium chloride 0.9 % 100 mL IVPB  Status:  Discontinued        1 g 200 mL/hr over 30 Minutes Intravenous Every 24 hours 08/06/22 1726 08/07/22 1458   08/07/22 0000  amoxicillin (AMOXIL) 875 MG tablet  Status:  Discontinued        875 mg Oral 2 times daily 08/07/22 1133 08/07/22    08/07/22 0000  doxycycline (VIBRAMYCIN) 100 MG capsule        100 mg Oral 2 times daily 08/07/22 1133 08/12/22 2359   08/07/22 0000  amoxicillin (AMOXIL) 500 MG tablet        500 mg Oral 2 times daily 08/07/22 1412 08/12/22 2359   08/06/22 1645  cefTRIAXone (ROCEPHIN) 1 g in sodium chloride 0.9 % 100 mL IVPB        1 g 200 mL/hr over 30 Minutes Intravenous  Once 08/06/22 1637 08/06/22 1840   08/06/22 1645  doxycycline (VIBRA-TABS) tablet 100 mg        100 mg Oral  Once 08/06/22 1637 08/06/22 1744        Medications  Scheduled Meds:  acetaminophen  1,000 mg Oral Q6H   amLODipine  10 mg Oral Daily   aspirin EC  81 mg Oral Daily   Chlorhexidine Gluconate Cloth  6 each Topical Q0600   insulin aspart  0-5 Units Subcutaneous QHS   insulin aspart  0-6 Units Subcutaneous Q4H   insulin glargine-yfgn  5 Units Subcutaneous Daily   labetalol  300 mg Oral Daily   sodium chloride flush  3 mL Intravenous Q12H   Continuous Infusions:  sodium chloride Stopped (08/12/22 1618)   anticoagulant sodium  citrate      ceFAZolin (ANCEF) IV Stopped (08/12/22 1839)   PRN Meds:.alteplase, anticoagulant sodium citrate, heparin, heparin, heparin,  HYDROmorphone (DILAUDID) injection, lidocaine (PF), lidocaine-prilocaine, ondansetron (ZOFRAN) IV, oxyCODONE, pentafluoroprop-tetrafluoroeth, polyethylene glycol    Subjective:   [name] was seen and examined today.  No new complaints.   Vitals:   08/14/22 0933 08/14/22 1400 08/14/22 1416 08/14/22 1430  BP: 139/67 (!) 157/70 (!) 160/71 (!) 154/70  Pulse: 80 82 81 80  Resp: 17 18 15 13   Temp: 97.7 F (36.5 C) 98.1 F (36.7 C)    TempSrc: Oral     SpO2: 96% 100% 100% 100%  Weight:  55.1 kg    Height:       No intake or output data in the 24 hours ending 08/14/22 1501  Filed Weights   08/12/22 0940 08/13/22 0459 08/14/22 1400  Weight: 54 kg 57.8 kg 55.1 kg     Exam General exam: Appears calm and comfortable  Respiratory system: Clear to auscultation. Respiratory effort normal. Cardiovascular system: S1 & S2 heard, RRR. No JVD,  No pedal edema. Gastrointestinal system: Abdomen is nondistended, soft and nontender.  Central nervous system: Alert and oriented. No focal neurological deficits. Extremities: left arm bandaged.  Skin: No rashes,  Psychiatry:Mood & affect appropriate.     Data Reviewed:  I have personally reviewed following labs and imaging studies   CBC Lab Results  Component Value Date   WBC 7.5 08/13/2022   RBC 3.71 (L) 08/13/2022   HGB 10.2 (L) 08/13/2022   HCT 31.0 (L) 08/13/2022   MCV 83.6 08/13/2022   MCH 27.5 08/13/2022   PLT 353 08/13/2022   MCHC 32.9 08/13/2022   RDW 18.3 (H) 08/13/2022   LYMPHSABS 0.3 (L) 08/13/2022   MONOABS 0.4 08/13/2022   EOSABS 0.1 08/13/2022   BASOSABS 0.0 00/71/2197     Last metabolic panel Lab Results  Component Value Date   NA 136 08/14/2022   K 4.3 08/14/2022   CL 92 (L) 08/14/2022   CO2 27 08/14/2022   BUN 54 (H) 08/14/2022   CREATININE 8.49 (H) 08/14/2022   GLUCOSE 141 (H) 08/14/2022   GFRNONAA 5 (L) 08/14/2022   GFRAA 9 (L) 02/14/2020   CALCIUM 9.2 08/14/2022   PHOS 4.3 08/13/2022   PROT  6.6 08/09/2022   ALBUMIN 3.0 (L) 08/13/2022   BILITOT 0.6 08/09/2022   ALKPHOS 63 08/09/2022   AST 117 (H) 08/09/2022   ALT 47 (H) 08/09/2022   ANIONGAP 17 (H) 08/14/2022    CBG (last 3)  Recent Labs    08/14/22 0419 08/14/22 0930 08/14/22 1146  GLUCAP 156* 134* 146*       Coagulation Profile: No results for input(s): "INR", "PROTIME" in the last 168 hours.   Radiology Studies: No results found.     Hosie Poisson M.D. [facility] 08/14/2022, 3:01 PM  Available via Epic secure chat 7am-7pm After 7 pm, please refer to night coverage provider listed on amion.

## 2022-08-14 NOTE — Progress Notes (Signed)
Patient transported to hemodialysis via bed by transportation staff.

## 2022-08-14 NOTE — Progress Notes (Signed)
   [facility] has been requested to perform a transesophageal echocardiogram on [name].  After careful review of history and examination, the risks and benefits of transesophageal echocardiogram have been explained including risks of esophageal damage, perforation (1:10,000 risk), bleeding, pharyngeal hematoma as well as other potential complications associated with conscious sedation including aspiration, arrhythmia, respiratory failure and death. Alternatives to treatment were discussed, questions were answered. Patient is willing to proceed.   She is scheduled for TEE tomorrow 08/15/22 with Dr. Harl Bowie. NPO at [facility] please.  Tami Lin Larenzo Caples, [place]  08/14/2022 2:24 PM

## 2022-08-14 NOTE — Progress Notes (Signed)
HD consent signed and in chart.  

## 2022-08-14 NOTE — Progress Notes (Signed)
[facility] Progress Note   Subjective:  Seen in room. No events over night. Denies fevers, chills, cp, dyspnea. Dialysis today    Objective Vitals:   08/13/22 1530 08/13/22 2017 08/14/22 0428 08/14/22 0933  BP: (!) 132/55 131/79 (!) 117/58 139/67  Pulse: 81 83 80 80  Resp: 16 17 16 17   Temp: 98.5 F (36.9 C) 97.6 F (36.4 C) (!) 97.5 F (36.4 C) 97.7 F (36.5 C)  TempSrc: Oral Oral Oral Oral  SpO2: 92% 95% 90% 96%  Weight:      Height:       Physical Exam General: well appearing female in NAD Heart: RRR, no mrg Lungs: CTAB, nml WOB on RA Abdomen: soft, NTND Extremities: no LE edema: L arm in ACE wrap  Dialysis Access: RU AVG +b/t   Filed Weights   08/11/22 0500 08/12/22 0940 08/13/22 0459  Weight: 54 kg 54 kg 57.8 kg   No intake or output data in the 24 hours ending 08/14/22 1207   Additional Objective Labs: Basic Metabolic Panel: Recent Labs  Lab 08/09/22 0313 08/12/22 1056 08/13/22 0141 08/13/22 1421  NA 132* 137 134* 132*  K 3.7 3.7 4.2 4.6  CL 91* 101 94* 88*  CO2 22  --  22 17*  GLUCOSE 173* 153* 231* 388*  BUN 49* 43* 21* 37*  CREATININE 8.41* 9.80* 4.92* 6.69*  CALCIUM 9.1  --  9.1 8.9  PHOS 6.0*  --  4.3  --     Liver Function Tests: Recent Labs  Lab 08/08/22 0339 08/09/22 0313 08/13/22 0141  AST 160* 117*  --   ALT 152* 47*  --   ALKPHOS 53 63  --   BILITOT 1.0 0.6  --   PROT 6.3* 6.6  --   ALBUMIN 3.0* 3.0* 3.0*    No results for input(s): "LIPASE", "AMYLASE" in the last 168 hours.  CBC: Recent Labs  Lab 08/08/22 0339 08/09/22 0313 08/12/22 1056 08/13/22 0141  WBC 6.8 8.0  --  7.5  NEUTROABS 5.3 6.4  --  6.8  HGB 9.2* 9.2* 10.2* 10.2*  HCT 27.9* 28.0* 30.0* 31.0*  MCV 84.3 83.8  --  83.6  PLT 104* 109*  --  353    CBG: Recent Labs  Lab 08/13/22 2015 08/13/22 2332 08/14/22 0419 08/14/22 0930 08/14/22 1146  GLUCAP 327* 289* 156* 134* 146*     Studies/Results: No results  found.  Medications:  sodium chloride Stopped (08/12/22 1618)   anticoagulant sodium citrate      ceFAZolin (ANCEF) IV Stopped (08/12/22 1839)    acetaminophen  1,000 mg Oral Q6H   amLODipine  10 mg Oral Daily   aspirin EC  81 mg Oral Daily   Chlorhexidine Gluconate Cloth  6 each Topical Q0600   insulin aspart  0-5 Units Subcutaneous QHS   insulin aspart  0-6 Units Subcutaneous Q4H   insulin glargine-yfgn  5 Units Subcutaneous Daily   labetalol  300 mg Oral Daily   sodium chloride flush  3 mL Intravenous Q12H      Dialysis Orders: [facility] TTS  425 / 800, 2K 2 Ca, EDW 58.4 (left at 57.8 on 1/16), 3:30, AVG UFP4  venofer 50 weekly, Mircera 50q2 (given 1/16) Heparin 2K Units hectorol 2 TIW, sensipar 30 TIW     Assessment/Plan: 1. Staph aureus Bacteremia - BC at OP center + MSSA. Source ->infected old left AVG. Removed in OR on 1/23- cultures pending.  ID following ABX changed to  cefazolin. For TEE on 1/26.  2. Hypoxia/CAP/pleural effusion - Improved. ID suspects likely infection related. Has been on RA this week. 3. ESRD - on HD TTS.  Continue on schedule. Next HD 1/25.  4. Anemia of CKD-  Hgb 10.2 No indication for ESA, just given on 1/16.  5. Secondary hyperparathyroidism -  Calcium in goal.  Phos elevated but improving.  Continue VDRA, binders and sensipar for now 6. HTN/volume - BP well controlled.  Continue home meds. Under EDW if weights correct, will need lower dry, standing weight post HD if able.  7. H/o right sided pleural effusion + h/o CT in the past. Moderate R pleural effusion on CXR 08/06/22.  8. Nutrition - Renal diet w/fluid restrictions. Alb 3.4. +protein supplements. 9. DM since age 28 11. PAD    Lynnda Child PA-C [facility] 08/14/2022,12:07 PM

## 2022-08-14 NOTE — Progress Notes (Signed)
PHARMACY CONSULT NOTE FOR:  OUTPATIENT  PARENTERAL ANTIBIOTIC THERAPY (OPAT)  NOTE: This OPAT is informational only as the patient will be receiving antibiotic therapy with HD. Renal team aware.   Indication: MSSA bacteremia complicated by AVG infeciton  Regimen: cefazolin 2g IV QHD TTS  End date: 09/23/22 (6w from excision of graft)   IV antibiotic discharge orders are pended. To discharging provider:  please sign these orders via discharge navigator,  Select New Orders & click on the button choice - Manage This Unsigned Work.     Thank you for allowing pharmacy to be a part of this patient's care.  Adria Dill, PharmD PGY-2 Infectious Diseases Resident  08/15/2022 8:26 AM

## 2022-08-14 NOTE — Progress Notes (Signed)
Received patient in bed to unit.  Alert and oriented.  Informed consent signed and in chart.   Treatment initiated: 14:17 Treatment completed: 18:02  Patient tolerated well.  Transported back to the room  Alert, without acute distress.  Hand-off given to patient's nurse.   Access used: right AVG Access issues: none  Total UF removed: 658ml Medication(s) given: none   08/14/22 1802  Vitals  Temp 98.2 F (36.8 C)  Temp Source Oral  BP (!) 163/73  MAP (mmHg) 98  BP Location Left Wrist  BP Method Automatic  Patient Position (if appropriate) Lying  Pulse Rate 82  Pulse Rate Source Monitor  Resp 13  Oxygen Therapy  SpO2 100 %  O2 Device Nasal Cannula  O2 Flow Rate (L/min) 2 L/min  During Treatment Monitoring  Intra-Hemodialysis Comments Tx completed  Dialysis Fluid Bolus Normal Saline  Post Treatment  Dialyzer Clearance Lightly streaked  Duration of HD Treatment -hour(s) 3.5 hour(s)  Liters Processed 84  Fluid Removed (mL) 600 mL  Tolerated HD Treatment No (Comment) (cramped during tx out of uf for most of it)  AVG/AVF Arterial Site Held (minutes) 10 minutes  AVG/AVF Venous Site Held (minutes) 10 minutes  Fistula / Graft Right Upper arm Arteriovenous vein graft  No Placement Date or Time found.   Placed prior to admission: Yes  Orientation: Right  Access Location: Upper arm  Access Type: Arteriovenous vein graft  Status Deaccessed;Flushed     Naimah Yingst [facility]

## 2022-08-14 NOTE — Anesthesia Preprocedure Evaluation (Addendum)
Anesthesia Evaluation  Patient identified by MRN, date of birth, ID band Patient awake    Reviewed: Allergy & Precautions, NPO status , Patient's Chart, lab work & pertinent test results, reviewed documented beta blocker date and time   History of Anesthesia Complications Negative for: history of anesthetic complications  Airway Mallampati: II  TM Distance: >3 FB Neck ROM: Full    Dental no notable dental hx.    Pulmonary neg pulmonary ROS, Patient abstained from smoking.   Pulmonary exam normal        Cardiovascular hypertension, Pt. on medications and Pt. on home beta blockers + Peripheral Vascular Disease and +CHF  Normal cardiovascular exam  TTE 08/08/22: EF 55-60%, mild LVH, grade II DD, mild RVE, moderate pHTN (RVSP 59.73mHg), mild RAE/LAE, moderate TR    Neuro/Psych   Anxiety     negative neurological ROS     GI/Hepatic Neg liver ROS,GERD  ,,  Endo/Other  diabetes, Type 1, Insulin Dependent    Renal/GU ESRF and DialysisRenal disease  negative genitourinary   Musculoskeletal  (+) Arthritis ,    Abdominal   Peds  Hematology  (+) Blood dyscrasia (Hgb 10.2), anemia   Anesthesia Other Findings Bacteremia  Reproductive/Obstetrics negative OB ROS                             Anesthesia Physical Anesthesia Plan  ASA: 4  Anesthesia Plan: MAC   Post-op Pain Management: Minimal or no pain anticipated   Induction:   PONV Risk Score and Plan: 2 and Treatment may vary due to age or medical condition and Propofol infusion  Airway Management Planned: Natural Airway and Nasal Cannula  Additional Equipment: None  Intra-op Plan:   Post-operative Plan:   Informed Consent: I have reviewed the patients History and Physical, chart, labs and discussed the procedure including the risks, benefits and alternatives for the proposed anesthesia with the patient or authorized representative who has  indicated his/her understanding and acceptance.       Plan Discussed with: CRNA  Anesthesia Plan Comments:         Anesthesia Quick Evaluation

## 2022-08-14 NOTE — Progress Notes (Signed)
[facility] Infectious Diseases Follow Up Note  Patient Identification: Patient Name: [name] MRN: [id number] [place] Date: 08/06/2022 11:20 AM Age: 47 y.o.Today's Date: 08/14/2022  Reason for Visit: MSSA bacteremia  Principal Problem:   Staphylococcus aureus bacteremia Active Problems:   Essential hypertension, benign   DM (diabetes mellitus) ([place])   ESRD on hemodialysis ([place])   History of CVA (cerebrovascular accident)   Pneumonia, staphylococcal ([place])   Infection of AV graft for dialysis ([place])  Antibiotics:  Doxycycline 1/17, Azithromycin 1/18-1/19 Ceftriaxone 1/17- 1/19 Cefazolin 1/18, cefazolin 1/20-c   Lines/Hardware: Rt UE AV graft , left UE AV graft s/p excision 1/23, TEE on 1/26  Interval Events: remains afebrile  Assessment 47 year old female with PMH as below including ESRD on HD via rt  upper extremity AV graft, type I DM, HTN, streptococcus vestibularis bacteremia in 01/2020( treated with 2 weeks of IV cefazolin)  to the ED on 1/17 with shortness of breath/?cough . Admitted with:    # MSSA bacteremia r/o endocarditis  - Repeat blood cx 1/18 NG in 5 days - TTE 1/19 no vegetations or concerns for endocarditis    # Infected Left AV Graft  S/p Left arm axillary AV loop graft excision and harvest of left basilic vein and vein angioplasty. OR cx staph aureus.   # Acute hypoxic respiratory failure 2/2 pna, likely staph aureus related, moderate rt pleural effusion - resolved  # Transaminitis - in the setting of sepsis, improving, 1/17 acute hepatitis panel negative  # Thrombocytopenia - in the setting of sepsis, improved  Recommendations Continue cefazolin.  TEE planned on 1/26 Fu OR cultures - staph aureus Monitor CBC and BMP  Anticipate prolonged IV abtx with HD pending TEE Fu in ID clinic will be arranged.   Rest of the management as per the primary team. Thank you for the consult. Please page with  pertinent questions or concerns.  ______________________________________________________________________ Subjective Patient seen and examined at bedside. No complaints whatsoever. She is aware about TEE tomorrow.   Vitals BP (!) 117/58 (BP Location: Right Leg)   Pulse 80   Temp (!) 97.5 F (36.4 C) (Oral)   Resp 16   Ht 5\' 3"  (1.6 m)   Wt 57.8 kg   SpO2 90%   BMI 22.57 kg/m   Black female lying in the bed and appears comfortable  HEENT wnl Chest - normal lung sounds CVS- RRR Abdomen - non distended  MSK - no signs of septic joint or spinal tenderness, Rt AVG with no signs of infection, Left AVG is covered in a bandage and ACE C/D/I, able to move left hand and fingers  Pertinent Microbiology Results for orders placed or performed during the hospital encounter of 08/06/22  Resp panel by RT-PCR (RSV, Flu A&B, Covid) Anterior Nasal Swab     Status: None   Collection Time: 08/06/22 11:50 AM   Specimen: Anterior Nasal Swab  Result Value Ref Range Status   SARS Coronavirus 2 by RT PCR NEGATIVE NEGATIVE Final    Comment: (NOTE) SARS-CoV-2 target nucleic acids are NOT DETECTED.  The SARS-CoV-2 RNA is generally detectable in upper respiratory specimens during the acute phase of infection. The lowest concentration of SARS-CoV-2 viral copies this assay can detect is 138 copies/mL. A negative result does not preclude SARS-Cov-2 infection and should not be used as the sole basis for treatment or other patient management decisions. A negative result may occur with  improper specimen collection/handling, submission of specimen other than nasopharyngeal swab, presence  of viral mutation(s) within the areas targeted by this assay, and inadequate number of viral copies(<138 copies/mL). A negative result must be combined with clinical observations, patient history, and epidemiological information. The expected result is Negative.  Fact Sheet for Patients:   EntrepreneurPulse.com.au  Fact Sheet for Healthcare Providers:  IncredibleEmployment.be  This test is no t yet approved or cleared by the [place] FDA and  has been authorized for detection and/or diagnosis of SARS-CoV-2 by FDA under an Emergency Use Authorization (EUA). This EUA will remain  in effect (meaning this test can be used) for the duration of the COVID-19 declaration under Section 564(b)(1) of the Act, 21 U.S.C.section 360bbb-3(b)(1), unless the authorization is terminated  or revoked sooner.       Influenza A by PCR NEGATIVE NEGATIVE Final   Influenza B by PCR NEGATIVE NEGATIVE Final    Comment: (NOTE) The Xpert Xpress SARS-CoV-2/FLU/RSV plus assay is intended as an aid in the diagnosis of influenza from Nasopharyngeal swab specimens and should not be used as a sole basis for treatment. Nasal washings and aspirates are unacceptable for Xpert Xpress SARS-CoV-2/FLU/RSV testing.  Fact Sheet for Patients: EntrepreneurPulse.com.au  Fact Sheet for Healthcare Providers: IncredibleEmployment.be  This test is not yet approved or cleared by the [place] FDA and has been authorized for detection and/or diagnosis of SARS-CoV-2 by FDA under an Emergency Use Authorization (EUA). This EUA will remain in effect (meaning this test can be used) for the duration of the COVID-19 declaration under Section 564(b)(1) of the Act, 21 U.S.C. section 360bbb-3(b)(1), unless the authorization is terminated or revoked.     Resp Syncytial Virus by PCR NEGATIVE NEGATIVE Final    Comment: (NOTE) Fact Sheet for Patients: EntrepreneurPulse.com.au  Fact Sheet for Healthcare Providers: IncredibleEmployment.be  This test is not yet approved or cleared by the [place] FDA and has been authorized for detection and/or diagnosis of SARS-CoV-2 by FDA under an Emergency Use  Authorization (EUA). This EUA will remain in effect (meaning this test can be used) for the duration of the COVID-19 declaration under Section 564(b)(1) of the Act, 21 U.S.C. section 360bbb-3(b)(1), unless the authorization is terminated or revoked.  Performed at [facility], [place] [address], [place], [place] [address]   Respiratory (~20 pathogens) panel by PCR     Status: None   Collection Time: 08/06/22 11:50 AM   Specimen: Nasopharyngeal Swab; Respiratory  Result Value Ref Range Status   Adenovirus NOT DETECTED NOT DETECTED Final   Coronavirus 229E NOT DETECTED NOT DETECTED Final    Comment: (NOTE) The Coronavirus on the Respiratory Panel, DOES NOT test for the novel  Coronavirus (2019 nCoV)    Coronavirus HKU1 NOT DETECTED NOT DETECTED Final   Coronavirus NL63 NOT DETECTED NOT DETECTED Final   Coronavirus OC43 NOT DETECTED NOT DETECTED Final   Metapneumovirus NOT DETECTED NOT DETECTED Final   Rhinovirus / Enterovirus NOT DETECTED NOT DETECTED Final   Influenza A NOT DETECTED NOT DETECTED Final   Influenza B NOT DETECTED NOT DETECTED Final   Parainfluenza Virus 1 NOT DETECTED NOT DETECTED Final   Parainfluenza Virus 2 NOT DETECTED NOT DETECTED Final   Parainfluenza Virus 3 NOT DETECTED NOT DETECTED Final   Parainfluenza Virus 4 NOT DETECTED NOT DETECTED Final   Respiratory Syncytial Virus NOT DETECTED NOT DETECTED Final   Bordetella pertussis NOT DETECTED NOT DETECTED Final   Bordetella Parapertussis NOT DETECTED NOT DETECTED Final   Chlamydophila pneumoniae NOT DETECTED NOT DETECTED Final  Mycoplasma pneumoniae NOT DETECTED NOT DETECTED Final    Comment: Performed at [facility], [place] [address] [place], [place] [address]  MRSA Next Gen by PCR, Nasal     Status: None   Collection Time: 08/07/22  2:59 AM   Specimen: Nasal Mucosa; Nasal Swab  Result Value Ref Range Status   MRSA by PCR Next Gen NOT DETECTED NOT DETECTED Final    Comment: (NOTE) The GeneXpert  MRSA Assay (FDA approved for NASAL specimens only), is one component of a comprehensive MRSA colonization surveillance program. It is not intended to diagnose MRSA infection nor to guide or monitor treatment for MRSA infections. Test performance is not FDA approved in patients less than 10 years old. Performed at [facility], [place] [address], [place], [place] [address]   Culture, blood (Routine X 2) w Reflex to ID Panel     Status: None   Collection Time: 08/07/22  4:17 PM   Specimen: BLOOD LEFT FOREARM  Result Value Ref Range Status   Specimen Description BLOOD LEFT FOREARM  Final   Special Requests   Final    BOTTLES DRAWN AEROBIC ONLY Blood Culture adequate volume   Culture   Final    NO GROWTH 5 DAYS Performed at [facility], [place] [address], [place], [place] [address]    Report Status 08/12/2022 FINAL  Final  Culture, blood (Routine X 2) w Reflex to ID Panel     Status: None   Collection Time: 08/07/22  4:18 PM   Specimen: BLOOD LEFT HAND  Result Value Ref Range Status   Specimen Description BLOOD LEFT HAND  Final   Special Requests   Final    BOTTLES DRAWN AEROBIC AND ANAEROBIC Blood Culture adequate volume   Culture   Final    NO GROWTH 5 DAYS Performed at [facility], [place] [address], [place], [place] [address]    Report Status 08/12/2022 FINAL  Final  Aerobic/Anaerobic Culture w Gram Stain (surgical/deep wound)     Status: None (Preliminary result)   Collection Time: 08/12/22  2:52 PM   Specimen: PATH Soft tissue  Result Value Ref Range Status   Specimen Description WOUND LEFT ARM  Final   Special Requests AV GRAFT  Final   Gram Stain   Final    RARE WBC PRESENT,BOTH PMN AND MONONUCLEAR RARE GRAM POSITIVE COCCI    Culture   Final    RARE STAPHYLOCOCCUS AUREUS SUSCEPTIBILITIES TO FOLLOW Performed at [facility], [place] [address] [place], [place] [address]    Report Status PENDING  Incomplete   Pertinent Lab.    Latest Ref Rng & Units  08/13/2022    1:41 AM 08/12/2022   10:56 AM 08/09/2022    3:13 AM  CBC  WBC 4.0 - 10.5 K/uL 7.5   8.0   Hemoglobin 12.0 - 15.0 g/dL 10.2  10.2  9.2   Hematocrit 36.0 - 46.0 % 31.0  30.0  28.0   Platelets 150 - 400 K/uL 353   109       Latest Ref Rng & Units 08/13/2022    2:21 PM 08/13/2022    1:41 AM 08/12/2022   10:56 AM  CMP  Glucose 70 - 99 mg/dL 388  231  153   BUN 6 - 20 mg/dL 37  21  43   Creatinine 0.44 - 1.00 mg/dL 6.69  4.92  9.80   Sodium 135 - 145 mmol/L 132  134  137   Potassium 3.5 - 5.1 mmol/L 4.6  4.2  3.7   Chloride 98 - 111 mmol/L 88  94  101   CO2 22 - 32 mmol/L 17  22    Calcium 8.9 - 10.3 mg/dL 8.9  9.1      Pertinent Imaging today Plain films and CT images have been personally visualized and interpreted; radiology reports have been reviewed. Decision making incorporated into the Impression / Recommendations.  No results found.  I spent 51 minutes for this patient encounter including review of prior medical records, coordination of care with primary/other specialist with greater than 50% of time being face to face/counseling and discussing diagnostics/treatment plan with the patient/family.  Electronically signed by:   Rosiland Oz, MD Infectious Disease Physician [facility] Pager: [phone]

## 2022-08-14 NOTE — Progress Notes (Signed)
Report given to HD ?

## 2022-08-14 NOTE — Progress Notes (Signed)
PT Cancellation Note  Patient Details Name: [name] MRN: [id number] DOB: 23-Aug-1975   Cancelled Treatment:    Reason Eval/Treat Not Completed: Patient at procedure or test/unavailable.  Pt in HD until at least 6 pm.  Will have to defer PT until 1/26. 08/14/2022  Ginger Carne., PT Acute Rehabilitation Services [phone]  (office)   [name] 08/14/2022, 4:22 PM

## 2022-08-14 NOTE — Progress Notes (Addendum)
  Progress Note    08/14/2022 7:12 AM 2 Days Post-Op  Subjective:  no complaints  afebrile  Vitals:   08/13/22 2017 08/14/22 0428  BP: 131/79 (!) 117/58  Pulse: 83 80  Resp: 17 16  Temp: 97.6 F (36.4 C) (!) 97.5 F (36.4 C)  SpO2: 95% 90%    Physical Exam: General:  no distress Cardiac:  regular Lungs:  non labored Incisions:    Left upper arm medial and lateral incisions   Left axillary incision   Extremities:  +palpable left radial pulse   CBC    Component Value Date/Time   WBC 7.5 08/13/2022 0141   RBC 3.71 (L) 08/13/2022 0141   HGB 10.2 (L) 08/13/2022 0141   HCT 31.0 (L) 08/13/2022 0141   PLT 353 08/13/2022 0141   MCV 83.6 08/13/2022 0141   MCH 27.5 08/13/2022 0141   MCHC 32.9 08/13/2022 0141   RDW 18.3 (H) 08/13/2022 0141   LYMPHSABS 0.3 (L) 08/13/2022 0141   MONOABS 0.4 08/13/2022 0141   EOSABS 0.1 08/13/2022 0141   BASOSABS 0.0 08/13/2022 0141    BMET    Component Value Date/Time   NA 132 (L) 08/13/2022 1421   K 4.6 08/13/2022 1421   CL 88 (L) 08/13/2022 1421   CO2 17 (L) 08/13/2022 1421   GLUCOSE 388 (H) 08/13/2022 1421   BUN 37 (H) 08/13/2022 1421   CREATININE 6.69 (H) 08/13/2022 1421   CALCIUM 8.9 08/13/2022 1421   CALCIUM 10.2 12/03/2009 1101   GFRNONAA 7 (L) 08/13/2022 1421   GFRAA 9 (L) 02/14/2020 0116    INR    Component Value Date/Time   INR 1.1 10/21/2019 0354     Intake/Output Summary (Last 24 hours) at 08/14/2022 1751 Last data filed at 08/13/2022 0900 Gross per 24 hour  Intake 120 ml  Output --  Net 120 ml    Component 2 d ago  Specimen Description WOUND LEFT ARM  Special Requests AV GRAFT  Gram Stain RARE WBC PRESENT,BOTH PMN AND MONONUCLEAR RARE GRAM POSITIVE COCCI  Culture RARE STAPHYLOCOCCUS AUREUS SUSCEPTIBILITIES TO FOLLOW Performed at [facility], [name] [place], [place] [address]  Report Status PENDING    Assessment/Plan:  47 y.o. female is s/p:  1.  Excision of left arm  axillary AV loop graft 2.  Harvest left basilic vein 3.  Vein patch angioplasty left axillary artery  2 Days Post-Op   -palpable left radial pulse -dressings removed and incisions look good -wound cx with rare staph aureus-susceptibilities to follow-on Ancef -continue ace wrap from hand to axilla to help with swelling    Leontine Locket, PA-C [facility] [phone] 08/14/2022 7:12 AM  I have independently interviewed and examined patient and agree with PA assessment and plan above. Will plan to leave staples for at least 4 weeks.   Ester Mabe C. Donzetta Matters, MD [facility] [place] Office: [phone] Pager: [phone]

## 2022-08-15 ENCOUNTER — Encounter (HOSPITAL_COMMUNITY)
Admission: EM | Disposition: A | Discharge status: Home Health | Payer: Self-pay | Source: Home / Self Care | Attending: Internal Medicine

## 2022-08-15 ENCOUNTER — Inpatient Hospital Stay (HOSPITAL_COMMUNITY): Payer: Medicare HMO | Admitting: Anesthesiology

## 2022-08-15 ENCOUNTER — Inpatient Hospital Stay (HOSPITAL_COMMUNITY)
Admit: 2022-08-15 | Discharge: 2022-08-15 | Disposition: A | Discharge status: Home / Self Care | Payer: Medicare HMO | Attending: Physician Assistant | Admitting: Physician Assistant

## 2022-08-15 ENCOUNTER — Encounter (HOSPITAL_COMMUNITY): Payer: Self-pay | Admitting: Internal Medicine

## 2022-08-15 DIAGNOSIS — R7881 Bacteremia: Secondary | ICD-10-CM

## 2022-08-15 DIAGNOSIS — N186 End stage renal disease: Secondary | ICD-10-CM

## 2022-08-15 DIAGNOSIS — I361 Nonrheumatic tricuspid (valve) insufficiency: Secondary | ICD-10-CM | POA: Diagnosis not present

## 2022-08-15 DIAGNOSIS — D631 Anemia in chronic kidney disease: Secondary | ICD-10-CM

## 2022-08-15 DIAGNOSIS — I132 Hypertensive heart and chronic kidney disease with heart failure and with stage 5 chronic kidney disease, or end stage renal disease: Secondary | ICD-10-CM | POA: Diagnosis not present

## 2022-08-15 DIAGNOSIS — Z8673 Personal history of transient ischemic attack (TIA), and cerebral infarction without residual deficits: Secondary | ICD-10-CM | POA: Diagnosis not present

## 2022-08-15 DIAGNOSIS — I509 Heart failure, unspecified: Secondary | ICD-10-CM | POA: Diagnosis not present

## 2022-08-15 DIAGNOSIS — B9561 Methicillin susceptible Staphylococcus aureus infection as the cause of diseases classified elsewhere: Secondary | ICD-10-CM | POA: Diagnosis not present

## 2022-08-15 DIAGNOSIS — E1051 Type 1 diabetes mellitus with diabetic peripheral angiopathy without gangrene: Secondary | ICD-10-CM

## 2022-08-15 DIAGNOSIS — I10 Essential (primary) hypertension: Secondary | ICD-10-CM | POA: Diagnosis not present

## 2022-08-15 DIAGNOSIS — E1059 Type 1 diabetes mellitus with other circulatory complications: Secondary | ICD-10-CM | POA: Diagnosis not present

## 2022-08-15 HISTORY — PX: TEE WITHOUT CARDIOVERSION: SHX5443

## 2022-08-15 LAB — BASIC METABOLIC PANEL
Anion gap: 17 — ABNORMAL HIGH (ref 5–15)
BUN: 22 mg/dL — ABNORMAL HIGH (ref 6–20)
CO2: 25 mmol/L (ref 22–32)
Calcium: 8.9 mg/dL (ref 8.9–10.3)
Chloride: 89 mmol/L — ABNORMAL LOW (ref 98–111)
Creatinine, Ser: 5.32 mg/dL — ABNORMAL HIGH (ref 0.44–1.00)
GFR, Estimated: 10 mL/min — ABNORMAL LOW (ref >60)
Glucose, Bld: 480 mg/dL — ABNORMAL HIGH (ref 70–99)
Potassium: 4.5 mmol/L (ref 3.5–5.1)
Sodium: 131 mmol/L — ABNORMAL LOW (ref 135–145)

## 2022-08-15 LAB — GLUCOSE, CAPILLARY
Glucose-Capillary: 207 mg/dL — ABNORMAL HIGH (ref 70–99)
Glucose-Capillary: 315 mg/dL — ABNORMAL HIGH (ref 70–99)
Glucose-Capillary: 364 mg/dL — ABNORMAL HIGH (ref 70–99)
Glucose-Capillary: 482 mg/dL — ABNORMAL HIGH (ref 70–99)

## 2022-08-15 LAB — ECHO TEE

## 2022-08-15 SURGERY — ECHOCARDIOGRAM, TRANSESOPHAGEAL
Anesthesia: Monitor Anesthesia Care

## 2022-08-15 MED ORDER — SODIUM CHLORIDE 0.9 % IV SOLN: INTRAVENOUS | Status: DC

## 2022-08-15 MED ORDER — BUTAMBEN-TETRACAINE-BENZOCAINE 2-2-14 % EX AERO
INHALATION_SPRAY | CUTANEOUS | Status: DC | PRN
  Administered 2022-08-15: 2 via TOPICAL

## 2022-08-15 MED ORDER — PROPOFOL 500 MG/50ML IV EMUL
INTRAVENOUS | Status: DC | PRN
  Administered 2022-08-15: 75 ug/kg/min via INTRAVENOUS

## 2022-08-15 MED ORDER — PROPOFOL 10 MG/ML IV BOLUS
INTRAVENOUS | Status: DC | PRN
  Administered 2022-08-15 (×2): 30 mg via INTRAVENOUS
  Administered 2022-08-15: 50 mg via INTRAVENOUS
  Administered 2022-08-15: 20 mg via INTRAVENOUS

## 2022-08-15 MED ORDER — INSULIN ASPART 100 UNIT/ML IJ SOLN
0.0000 [IU] | Freq: Three times a day (TID) | INTRAMUSCULAR | Status: DC
  Administered 2022-08-15: 6 [IU] via SUBCUTANEOUS

## 2022-08-15 MED ORDER — INSULIN ASPART 100 UNIT/ML IJ SOLN
0.0000 [IU] | Freq: Three times a day (TID) | INTRAMUSCULAR | Status: DC
  Administered 2022-08-15: 7 [IU] via SUBCUTANEOUS

## 2022-08-15 MED ORDER — CEFAZOLIN IV (FOR PTA / DISCHARGE USE ONLY): 2.0000 g | INTRAVENOUS | 0 refills | Status: AC

## 2022-08-15 NOTE — CV Procedure (Signed)
INDICATIONS: infective endocarditis  PROCEDURE:   Informed consent was obtained prior to the procedure. The risks, benefits and alternatives for the procedure were discussed and the patient comprehended these risks.  Risks include, but are not limited to, cough, sore throat, vomiting, nausea, somnolence, esophageal and stomach trauma or perforation, bleeding, low blood pressure, aspiration, pneumonia, infection, trauma to the teeth and death.    After a procedural time-out, the oropharynx was anesthetized with 20% benzocaine spray.   During this procedure the patient was administered a total of 190 mg propofol  to achieve and maintain moderate conscious sedation.  The patient's heart rate, blood pressure, and oxygen saturationweare monitored continuously during the procedure. The period of conscious sedation was 20 minutes, of which I was present face-to-face 100% of this time.  The transesophageal probe was inserted in the esophagus and stomach without difficulty and multiple views were obtained.  The patient was kept under observation until the patient left the procedure room.  The patient left the procedure room in stable condition.   Agitated microbubble saline contrast was not administered.  COMPLICATIONS:    There were no immediate complications.  FINDINGS:  Normal LV function Moderate TR No vegetations on the cardiac valves  RECOMMENDATIONS:     No changes  Time Spent Directly with the Patient:  20 minutes   Janina Mayo 08/15/2022, 11:53 AM

## 2022-08-15 NOTE — Inpatient Diabetes Management (Signed)
At discharge, note that patient will need to wait 24 hours after getting Semglee at the hospital to put her insulin pump back on at home.   Harvel Ricks RN BSN CDE Diabetes Coordinator Pager: [phone]  8am-5pm

## 2022-08-15 NOTE — TOC Progression Note (Signed)
Transition of Care (TOC) - Progression Note   Spoke to Rachael PT and patient. Patient has improved and no longer needs walker.   Called Kristin with [facility] will come pick walker up and remove from patient's insurance.   Patient aware  Patient Details  Name: [name] MRN: [id number] Date of Birth: August 06, 1975  Transition of Care [facility]) CM/SW Contact  Trashawn Oquendo, Edson Snowball, RN Phone Number: 08/15/2022, 2:58 PM  Clinical Narrative:       Expected Discharge Plan: [place] Barriers to Discharge: Continued Medical Work up  Expected Discharge Plan and Services   Discharge Planning Services: CM Consult Post Acute Care Choice: Home Health, Durable Medical Equipment Living arrangements for the past 2 months: [place] Expected Discharge Date: 08/07/22               DME Arranged: Gilford Rile rolling DME Agency: [employer] Date DME Agency Contacted: 08/07/22 Time DME Agency Contacted: 1700 Representative spoke with at DME Agency: [place]: PT [place]: [place] Date [place]: 08/07/22 Time [place]: 1700 Representative spoke with at [place]: [place] Determinants of Health ([facility]) Interventions [place]: No Food Insecurity (08/07/2022)  Housing: Low Risk  (08/07/2022)  Transportation Needs: No Transportation Needs (08/07/2022)  Utilities: Not At Risk (08/07/2022)  Depression (PHQ2-9): Low Risk  (03/28/2020)  Tobacco Use: Low Risk  (08/15/2022)    Readmission Risk Interventions    06/03/2020    3:58 PM  Readmission Risk Prevention Plan  Transportation Screening Complete  PCP or Specialist Appt within 5-7 Days Complete  Medication Review (RN CM) Complete

## 2022-08-15 NOTE — Interval H&P Note (Signed)
History and Physical Interval Note:  08/15/2022 10:54 AM  [name]  has presented today for surgery, with the diagnosis of BACTERIMIA.  The various methods of treatment have been discussed with the patient and family. After consideration of risks, benefits and other options for treatment, the patient has consented to  Procedure(s): TRANSESOPHAGEAL ECHOCARDIOGRAM (TEE) (N/A) as a surgical intervention.  The patient's history has been reviewed, patient examined, no change in status, stable for surgery.  I have reviewed the patient's chart and labs.  Questions were answered to the patient's satisfaction.     Janina Mayo

## 2022-08-15 NOTE — Plan of Care (Signed)
  Problem: Clinical Measurements: Goal: Ability to maintain a body temperature in the normal range will improve Outcome: Progressing   Problem: Respiratory: Goal: Ability to maintain adequate ventilation will improve Outcome: Progressing   Problem: Education: Goal: Knowledge of General Education information will improve Description: Including pain rating scale, medication(s)/side effects and non-pharmacologic comfort measures Outcome: Progressing

## 2022-08-15 NOTE — Progress Notes (Signed)
Patient signed consent for TEE and placed in chart.

## 2022-08-15 NOTE — Progress Notes (Signed)
Contacted by renal NP regarding pt's likely d/c today. Contacted [place] SW regarding pt's likely d/c today and that pt should resume care tomorrow. Renal NP to send orders to clinic including iv abx orders.   Melven Sartorius Renal Navigator [phone]

## 2022-08-15 NOTE — Progress Notes (Signed)
[facility] Progress Note   Subjective: Just returned from TEE. C/O numbness L hand distal to AVG removal site. Ace wrap removed with resolution of symptoms. She has lost wt standing wt 54.1 kg.   Hoping to be discharged today.   Objective Vitals:   08/15/22 1055 08/15/22 1159 08/15/22 1209 08/15/22 1219  BP:  (!) 164/76 (!) 168/77 (!) 174/79  Pulse:  92 88 85  Resp:  18 14 12   Temp:  (!) 97.5 F (36.4 C)    TempSrc:  Temporal    SpO2: 99% 92% 95% 95%  Weight:      Height:       Physical Exam General: Pleasant female in NAD Heart: S1,S2 2/6 SEM No R/G Lungs: CTAB Abdomen: NT, NABS Extremities:  LUE with staples intact. No LE edema Dialysis Access: R AVG +T/B   Additional Objective Labs: Basic Metabolic Panel: Recent Labs  Lab 08/09/22 0313 08/12/22 1056 08/13/22 0141 08/13/22 1421 08/14/22 1218 08/15/22 0824  NA 132*   < > 134* 132* 136 131*  K 3.7   < > 4.2 4.6 4.3 4.5  CL 91*   < > 94* 88* 92* 89*  CO2 22  --  22 17* 27 25  GLUCOSE 173*   < > 231* 388* 141* 480*  BUN 49*   < > 21* 37* 54* 22*  CREATININE 8.41*   < > 4.92* 6.69* 8.49* 5.32*  CALCIUM 9.1  --  9.1 8.9 9.2 8.9  PHOS 6.0*  --  4.3  --   --   --    < > = values in this interval not displayed.   Liver Function Tests: Recent Labs  Lab 08/09/22 0313 08/13/22 0141 08/14/22 2146  AST 117*  --  26  ALT 47*  --  5  ALKPHOS 63  --  68  BILITOT 0.6  --  0.5  PROT 6.6  --  6.8  ALBUMIN 3.0* 3.0* 2.8*   No results for input(s): "LIPASE", "AMYLASE" in the last 168 hours. CBC: Recent Labs  Lab 08/09/22 0313 08/12/22 1056 08/13/22 0141  WBC 8.0  --  7.5  NEUTROABS 6.4  --  6.8  HGB 9.2* 10.2* 10.2*  HCT 28.0* 30.0* 31.0*  MCV 83.8  --  83.6  PLT 109*  --  353   Blood Culture    Component Value Date/Time   SDES WOUND LEFT ARM 08/12/2022 1452   SPECREQUEST AV GRAFT 08/12/2022 1452   CULT  08/12/2022 1452    RARE STAPHYLOCOCCUS AUREUS NO ANAEROBES ISOLATED; CULTURE IN  PROGRESS FOR 5 DAYS    REPTSTATUS PENDING 08/12/2022 1452    Cardiac Enzymes: No results for input(s): "CKTOTAL", "CKMB", "CKMBINDEX", "TROPONINI" in the last 168 hours. CBG: Recent Labs  Lab 08/14/22 2037 08/14/22 2201 08/15/22 0001 08/15/22 0744 08/15/22 1023  GLUCAP 121* 253* 207* 482* 364*   Iron Studies: No results for input(s): "IRON", "TIBC", "TRANSFERRIN", "FERRITIN" in the last 72 hours. @lablastinr3 @ Studies/Results: No results found. Medications:  sodium chloride Stopped (08/12/22 1618)   sodium chloride     [MAR Hold]  ceFAZolin (ANCEF) IV 2 g (08/14/22 2002)    [MAR Hold] acetaminophen  1,000 mg Oral Q6H   [MAR Hold] amLODipine  10 mg Oral Daily   [MAR Hold] aspirin EC  81 mg Oral Daily   [MAR Hold] Chlorhexidine Gluconate Cloth  6 each Topical Q0600   [MAR Hold] insulin aspart  0-5 Units Subcutaneous QHS   [MAR Hold]  insulin aspart  0-9 Units Subcutaneous TID WC   [MAR Hold] insulin glargine-yfgn  5 Units Subcutaneous Daily   [MAR Hold] labetalol  300 mg Oral Daily   [MAR Hold] sodium chloride flush  3 mL Intravenous Q12H     Dialysis Orders: SW/[facility] TTS  425 / 800, 2K 2 Ca, EDW 58.4 (left at 57.8 on 1/16), 3:30, AVG UFP4  Heparin 2000 Units IV TIW hectorol 2 mcg IV  TIW,  sensipar 30 mg PO TIW venofer 50  mg IVweekly,  Mircera 50 mcg IV q 2 weeks (given 1/16)     Assessment/Plan: 1. Staph aureus Bacteremia - BC at OP center + MSSA. Source ->infected old left AVG. Removed in OR on 1/23- cultures pending.  ID following ABX changed to cefazolin. TEE 08/15/2022 without evidence of vegetation on heart valves. Ancef 2 grams IV T,Th,S until 09/23/2022.  2. Hypoxia/CAP/pleural effusion - Improved. ID suspects likely infection related. Has been on RA this week. 3. ESRD - on HD TTS.  Continue on schedule. Next HD 08/16/2022 at [facility].  4. Anemia of CKD-  Hgb 10.2 No indication for ESA, just given on 1/16. Hold venofer while on IV ABX.   5. Secondary  hyperparathyroidism -  Calcium in goal.  Phos elevated but improving.  Continue VDRA, binders and sensipar for now 6. HTN/volume - BP well controlled.  Continue home meds. Standing wt today 54.1 kg. Lower EDW to 54 kg on discharge.  7. H/o right sided pleural effusion + h/o CT in the past. Moderate R pleural effusion on CXR 08/06/22.  8. Nutrition - Renal diet w/fluid restrictions. Alb 3.4. +protein supplements. 9. DM since age 47 11. PAD  Ifeanyichukwu Wickham H. Kaliah Haddaway NP-C 08/15/2022, 12:43 PM  [employer] [phone]

## 2022-08-15 NOTE — Progress Notes (Signed)
  Echocardiogram Echocardiogram Transesophageal has been performed.  [name] 08/15/2022, 12:11 PM

## 2022-08-15 NOTE — Transfer of Care (Signed)
Immediate Anesthesia Transfer of Care Note  Patient: ERLINE SIDDOWAY  Procedure(s) Performed: TRANSESOPHAGEAL ECHOCARDIOGRAM (TEE)  Patient Location: Endoscopy Unit  Anesthesia Type:MAC  Level of Consciousness: awake, alert , and oriented  Airway & Oxygen Therapy: Patient Spontanous Breathing and Patient connected to nasal cannula oxygen  Post-op Assessment: Report given to RN and Post -op Vital signs reviewed and stable  Post vital signs: Reviewed and stable  Last Vitals:  Vitals Value Taken Time  BP 165/79   Temp 98   Pulse 87   Resp 16   SpO2 94     Last Pain:  Vitals:   08/15/22 1045  TempSrc: Temporal  PainSc: 0-No pain      Patients Stated Pain Goal: 0 (07/13/81 5003)  Complications: No notable events documented.

## 2022-08-15 NOTE — Progress Notes (Signed)
Physical Therapy Treatment Patient Details Name: [name] MRN: [id number] DOB: 10/16/1975 Today's Date: 08/15/2022   History of Present Illness [name] is a 47 y.o. female with medical history significant of pleural effusion, hypertension, hyperlipidemia, anxiety, diabetes, ESRD, stroke presenting with shortness of breath.  Found to be negative for Flu, Covid and RSV.    PT Comments    Patient making good progress with mobility and completed bed mobility and transfers at Mod independent level today. Min guard/supervision provided for gait with no AD today. Pt had 1 small LOB to Lt but was able to self correct with appropriate stepping strategies. Ambulated ~350' with no c/o fatigue or SOB. SpO2 87% on RA with mobility and remained 88% at rest with occasional high of 91%. EOS provided IS and reviewed proper technique to complete breathing exercises. Pt initially achieved ~557mL and at end 750-104mL consistently. Will continue to progress as able.    Recommendations for follow up therapy are one component of a multi-disciplinary discharge planning process, led by the attending physician.  Recommendations may be updated based on patient status, additional functional criteria and insurance authorization.  Follow Up Recommendations  Home health PT     Assistance Recommended at Discharge Intermittent Supervision/Assistance  Patient can return home with the following A little help with walking and/or transfers;A little help with bathing/dressing/bathroom;Assist for transportation;Help with stairs or ramp for entrance   Equipment Recommendations  None recommended by PT    Recommendations for Other Services       Precautions / Restrictions Precautions Precautions: Fall Restrictions Weight Bearing Restrictions: No     Mobility  Bed Mobility Overal bed mobility: Modified Independent Bed Mobility: Supine to Sit, Sit to Supine     Supine to sit: Modified independent  (Device/Increase time) Sit to supine: Modified independent (Device/Increase time)   General bed mobility comments: pt taking extra time and HOB elevated    Transfers Overall transfer level: Modified independent Equipment used: None Transfers: Sit to/from Stand Sit to Stand: Modified independent (Device/Increase time)           General transfer comment: use of hand for rise and lower, no assist or cues for safety needed.    Ambulation/Gait Ambulation/Gait assistance: Min guard, Supervision Gait Distance (Feet): 350 Feet Assistive device: None Gait Pattern/deviations: Step-through pattern, Decreased stride length Gait velocity: decr     General Gait Details: slow cautious pace, 1x slight LOB but pt able to self correct and maintain balance. min guard at start with progression to supervision level.   Stairs             Wheelchair Mobility    Modified Rankin (Stroke Patients Only)       Balance Overall balance assessment: Needs assistance Sitting-balance support: Feet supported Sitting balance-Leahy Scale: Normal     Standing balance support: No upper extremity supported, During functional activity Standing balance-Leahy Scale: Good                              Cognition Arousal/Alertness: Awake/alert Behavior During Therapy: WFL for tasks assessed/performed Overall Cognitive Status: Within Functional Limits for tasks assessed                                          Exercises      General Comments  Pertinent Vitals/Pain Pain Assessment Pain Assessment: No/denies pain    Home Living                          Prior Function            PT Goals (current goals can now be found in the care plan section) Acute Rehab PT Goals PT Goal Formulation: With patient Time For Goal Achievement: 08/22/22 Potential to Achieve Goals: Good Progress towards PT goals: Progressing toward goals    Frequency     Min 3X/week      PT Plan Current plan remains appropriate    Co-evaluation              AM-PAC PT "6 Clicks" Mobility   Outcome Measure  Help needed turning from your back to your side while in a flat bed without using bedrails?: None Help needed moving from lying on your back to sitting on the side of a flat bed without using bedrails?: None Help needed moving to and from a bed to a chair (including a wheelchair)?: None Help needed standing up from a chair using your arms (e.g., wheelchair or bedside chair)?: None Help needed to walk in hospital room?: A Little Help needed climbing 3-5 steps with a railing? : A Little 6 Click Score: 22    End of Session Equipment Utilized During Treatment: Gait belt Activity Tolerance: Patient tolerated treatment well Patient left: in bed;with call bell/phone within reach Nurse Communication: Mobility status PT Visit Diagnosis: Other abnormalities of gait and mobility (R26.89);Pain Pain - Right/Left: Right Pain - part of body: Leg;Hip     Time: 4166-0630 PT Time Calculation (min) (ACUTE ONLY): 19 min  Charges:  $Gait Training: 8-22 mins                     Verner Mould, DPT Acute Rehabilitation Services Office [phone]  08/15/22 3:08 PM

## 2022-08-15 NOTE — Progress Notes (Signed)
[facility]                                                                               [name], is a 47 y.o. female, DOB - 1976/05/22, MVH:[id number] Admit date - 08/06/2022    Outpatient Primary MD for the patient is Osei-Bonsu, Iona Beard, MD  LOS - 7  days    Brief summary   [name] is a 47 yo female with PMH ESRD on HD, HTN, HLD, anxiety, DM II, CVA who presented with shortness of breath, weakness.  CXR on admission was concerning for right middle lobe opacity consistent with pneumonia.  She was started on Rocephin and azithromycin. After admission, we were informed that outpatient blood cultures from 08/05/2022 became positive for GPC, 4/4 bottles which have now speciated to MSSA. Cultures were obtained due to chills she developed during dialysis session.  She has Ongoing tenderness and swelling in her left arm at site of prior graft placement.  She underwent  entire excision  of the left arm axillary AV loop graft and vein patch angioplasty of the left axillary artery on 08/12/2022 with vascular surgery.  She is scheduled for TEE Tomorrow. No new events overnight.   Assessment & Plan    Assessment and Plan:  MSSA bacteremia:  Currently on IV ancef.  TTE is negative for vegetations.  TEE scheduled for 1/26.  ID on board and appreciate recommendations. Waiting for TEE to determine the duration of antibiotics.     Infected LUE graft:  Vascular surgery consulted. She underwent  entire excision  of the left arm axillary AV loop graft and vein patch angioplasty of the left axillary artery on 08/12/2022 with vascular surgery.  Incision site clean.  Follow up the cultures.     Acute respiratory failure with hypoxia from CAP;  Resolved. She is on RA .    ESRD on HD;  Further management as per nephrology.     Right ankle pain:  CT right ankle obtained to rule out septic joint; mostly shows arthritis changes  Resolved.   Insulin dependent DM:  CBG  (last 3)  Recent Labs    08/15/22 0001 08/15/22 0744 08/15/22 1023  GLUCAP 207* 482* 364*    Started the patient on semglee 5 units daily and SSI. Changed the SSI to sensitive scale. BMP ordered.    H/o CVA:  Resume aspirin.    Hypertension:  Well controlled.    Thrombocytopenia:  Resolved.    Transaminitis:  Improving. Check liver panel today.    Nausea and vomiting  Resolved.      Estimated body mass index is 22.3 kg/m as calculated from the following:   Height as of this encounter: 5\' 3"  (1.6 m).   Weight as of this encounter: 57.1 kg.  Code Status: full code.  DVT Prophylaxis:  SCDs Start: 08/06/22 1722   Level of Care: Level of care: Med-Surg Family Communication: none at bedside.   Disposition Plan:     Remains inpatient appropriate:  IV antibiotics and TEE on firday.   Procedures:  Excision of the AV graft on the LUE  Consultants:   Nephrology Vascular surgery.   Antimicrobials:  Anti-infectives (From admission, onward)    Start     Dose/Rate Route Frequency Ordered Stop   08/12/22 1800  [MAR Hold]  ceFAZolin (ANCEF) IVPB 2g/100 mL premix        (MAR Hold since Fri 08/15/2022 at 1042.Hold Reason: Transfer to a Procedural area)   2 g 200 mL/hr over 30 Minutes Intravenous Every T-Th-Sa (1800) 08/11/22 0810     08/09/22 1800  vancomycin (VANCOREADY) IVPB 500 mg/100 mL  Status:  Discontinued        500 mg 100 mL/hr over 60 Minutes Intravenous Every T-Th-Sa (1800) 08/07/22 1743 08/09/22 1742   08/09/22 1800  ceFAZolin (ANCEF) IVPB 2g/100 mL premix  Status:  Discontinued        2 g 200 mL/hr over 30 Minutes Intravenous Every T-Th-Sa (Hemodialysis) 08/09/22 1742 08/11/22 0810   08/08/22 1000  cefTRIAXone (ROCEPHIN) 2 g in sodium chloride 0.9 % 100 mL IVPB  Status:  Discontinued        2 g 200 mL/hr over 30 Minutes Intravenous Every 24 hours 08/07/22 1805 08/08/22 1346   08/07/22 1830  vancomycin (VANCOCIN) IVPB 1000 mg/200 mL premix         1,000 mg 200 mL/hr over 60 Minutes Intravenous  Once 08/07/22 1743 08/07/22 1925   08/07/22 1600  ceFAZolin (ANCEF) IVPB 2g/100 mL premix        2 g 200 mL/hr over 30 Minutes Intravenous  Once 08/07/22 1458 08/07/22 1648   08/07/22 1000  cefTRIAXone (ROCEPHIN) 2 g in sodium chloride 0.9 % 100 mL IVPB  Status:  Discontinued        2 g 200 mL/hr over 30 Minutes Intravenous Every 24 hours 08/06/22 1726 08/06/22 1726   08/07/22 1000  azithromycin (ZITHROMAX) 250 mg in dextrose 5 % 125 mL IVPB  Status:  Discontinued        250 mg 127.5 mL/hr over 60 Minutes Intravenous Every 24 hours 08/06/22 1726 08/08/22 1346   08/07/22 1000  cefTRIAXone (ROCEPHIN) 1 g in sodium chloride 0.9 % 100 mL IVPB  Status:  Discontinued        1 g 200 mL/hr over 30 Minutes Intravenous Every 24 hours 08/06/22 1726 08/07/22 1458   08/07/22 0000  amoxicillin (AMOXIL) 875 MG tablet  Status:  Discontinued        875 mg Oral 2 times daily 08/07/22 1133 08/07/22    08/07/22 0000  doxycycline (VIBRAMYCIN) 100 MG capsule        100 mg Oral 2 times daily 08/07/22 1133 08/12/22 2359   08/07/22 0000  amoxicillin (AMOXIL) 500 MG tablet        500 mg Oral 2 times daily 08/07/22 1412 08/12/22 2359   08/06/22 1645  cefTRIAXone (ROCEPHIN) 1 g in sodium chloride 0.9 % 100 mL IVPB        1 g 200 mL/hr over 30 Minutes Intravenous  Once 08/06/22 1637 08/06/22 1840   08/06/22 1645  doxycycline (VIBRA-TABS) tablet 100 mg        100 mg Oral  Once 08/06/22 1637 08/06/22 1744        Medications  Scheduled Meds:  [MAR Hold] acetaminophen  1,000 mg Oral Q6H   [MAR Hold] amLODipine  10 mg Oral Daily   [MAR Hold] aspirin EC  81 mg Oral Daily   [MAR Hold] Chlorhexidine Gluconate Cloth  6 each Topical Q0600   [MAR Hold] insulin aspart  0-5 Units Subcutaneous QHS   [MAR Hold] insulin aspart  0-9  Units Subcutaneous TID WC   [MAR Hold] insulin glargine-yfgn  5 Units Subcutaneous Daily   [MAR Hold] labetalol  300 mg Oral Daily   [MAR Hold]  sodium chloride flush  3 mL Intravenous Q12H   Continuous Infusions:  sodium chloride Stopped (08/12/22 1618)   [MAR Hold]  ceFAZolin (ANCEF) IV 2 g (08/14/22 2002)   PRN Meds:.[MAR Hold]  HYDROmorphone (DILAUDID) injection, [MAR Hold] ondansetron (ZOFRAN) IV, [MAR Hold] oxyCODONE, [MAR Hold] polyethylene glycol    Subjective:   [name] was seen and examined today. No nausea, vomiting.   Vitals:   08/15/22 0519 08/15/22 0835 08/15/22 1045 08/15/22 1055  BP: (!) 129/55 (!) 142/57 (!) 165/71   Pulse: 90 86 86   Resp: 16 16 20    Temp: 98.4 F (36.9 C) 98.7 F (37.1 C) 98.1 F (36.7 C)   TempSrc: Oral Oral Temporal   SpO2: 90% (!) 88% (!) 88% 99%  Weight: 57.1 kg     Height:        Intake/Output Summary (Last 24 hours) at 08/15/2022 1103 Last data filed at 08/15/2022 0837 Gross per 24 hour  Intake 0 ml  Output 600 ml  Net -600 ml    Filed Weights   08/14/22 1400 08/14/22 1802 08/15/22 0519  Weight: 55.1 kg 56.1 kg 57.1 kg     Exam General exam: Appears calm and comfortable  Respiratory system: Clear to auscultation. Respiratory effort normal. Cardiovascular system: S1 & S2 heard, RRR. No JVD,  No pedal edema. Gastrointestinal system: Abdomen is nondistended, soft and nontender.  Central nervous system: Alert and oriented. No focal neurological deficits. Extremities: left arm bandaged.  Skin: No rashes, lesions or ulcers Psychiatry: Mood & affect appropriate.     Data Reviewed:  I have personally reviewed following labs and imaging studies   CBC Lab Results  Component Value Date   WBC 7.5 08/13/2022   RBC 3.71 (L) 08/13/2022   HGB 10.2 (L) 08/13/2022   HCT 31.0 (L) 08/13/2022   MCV 83.6 08/13/2022   MCH 27.5 08/13/2022   PLT 353 08/13/2022   MCHC 32.9 08/13/2022   RDW 18.3 (H) 08/13/2022   LYMPHSABS 0.3 (L) 08/13/2022   MONOABS 0.4 08/13/2022   EOSABS 0.1 08/13/2022   BASOSABS 0.0 60/73/7106     Last metabolic panel Lab Results   Component Value Date   NA 131 (L) 08/15/2022   K 4.5 08/15/2022   CL 89 (L) 08/15/2022   CO2 25 08/15/2022   BUN 22 (H) 08/15/2022   CREATININE 5.32 (H) 08/15/2022   GLUCOSE 480 (H) 08/15/2022   GFRNONAA 10 (L) 08/15/2022   GFRAA 9 (L) 02/14/2020   CALCIUM 8.9 08/15/2022   PHOS 4.3 08/13/2022   PROT 6.8 08/14/2022   ALBUMIN 2.8 (L) 08/14/2022   BILITOT 0.5 08/14/2022   ALKPHOS 68 08/14/2022   AST 26 08/14/2022   ALT 5 08/14/2022   ANIONGAP 17 (H) 08/15/2022    CBG (last 3)  Recent Labs    08/15/22 0001 08/15/22 0744 08/15/22 1023  GLUCAP 207* 482* 364*       Coagulation Profile: No results for input(s): "INR", "PROTIME" in the last 168 hours.   Radiology Studies: No results found.     Hosie Poisson M.D. [facility] 08/15/2022, 11:03 AM  Available via Epic secure chat 7am-7pm After 7 pm, please refer to night coverage provider listed on amion.

## 2022-08-15 NOTE — Progress Notes (Addendum)
Patient BG 482 this morning. 6 Units given and MD Akula paged per order. TEE on hold for now.

## 2022-08-15 NOTE — Progress Notes (Signed)
Left upper extremity wounds are healing well with staples in place.  There is a strongly palpable left radial pulse and she has minimal pain.  I will have her follow-up in 3 to 4 weeks for staple removal in our office and she was instructed to call if there are issues prior to that.  Arthur Speagle C. Donzetta Matters, MD [facility] Office: [phone] Pager: [phone]

## 2022-08-15 NOTE — Progress Notes (Signed)
[facility] Infectious Diseases Follow Up Note  Patient Identification: Patient Name: [name] MRN: [id number] [place] Date: 08/06/2022 11:20 AM Age: 47 y.o.Today's Date: 08/15/2022  Reason for Visit: MSSA bacteremia/Infected AV graft   Principal Problem:   Staphylococcus aureus bacteremia Active Problems:   Essential hypertension, benign   DM (diabetes mellitus) ([place])   ESRD on hemodialysis ([place])   History of CVA (cerebrovascular accident)   Pneumonia, staphylococcal ([place])   Infection of AV graft for dialysis ([place])  Antibiotics:  Doxycycline 1/17, Azithromycin 1/18-1/19 Ceftriaxone 1/17- 1/19 Cefazolin 1/18, cefazolin 1/20-c   Lines/Hardware: Rt UE AV graft , left UE AV graft s/p excision 1/23, TEE on 1/26  Interval Events: remains afebrile  Assessment 47 year old female with PMH as below including ESRD on HD via rt  upper extremity AV graft, type I DM, HTN, streptococcus vestibularis bacteremia in 01/2020( treated with 2 weeks of IV cefazolin)  to the ED on 1/17 with shortness of breath/?cough . Admitted with:    # MSSA bacteremia r/o endocarditis  - Repeat blood cx 1/18 NG in 5 days - TTE 1/19 no vegetations or concerns for endocarditis   - TEE 1/26 prelim, no vegetations or endocarditis   # Infected Left AV Graft  S/p Left arm axillary AV loop graft excision and harvest of left basilic vein and vein angioplasty. OR cx MSSA   # Transaminitis - in the setting of sepsis, improving, 1/17 acute hepatitis panel negative  Recommendations Continue cefazolin.  Plan for 6 weeks of IV cefazolin via HD. EOT 09/23/22 Monitor CBC and BMP  Fu in ID clinic will be arranged.  Fu OR cx to completion, otherwise ID will SO.  D/w ID Pharm D   Rest of the management as per the primary team. Thank you for the consult. Please page with pertinent questions or  concerns.  ______________________________________________________________________ Subjective Patient in procedure room for TEE and could not be seen  Vitals BP (!) 129/55 (BP Location: Left Wrist)   Pulse 90   Temp 98.4 F (36.9 C) (Oral)   Resp 16   Ht 5\' 3"  (1.6 m)   Wt 57.1 kg   SpO2 90%   BMI 22.30 kg/m   Pertinent Microbiology Results for orders placed or performed during the hospital encounter of 08/06/22  Resp panel by RT-PCR (RSV, Flu A&B, Covid) Anterior Nasal Swab     Status: None   Collection Time: 08/06/22 11:50 AM   Specimen: Anterior Nasal Swab  Result Value Ref Range Status   SARS Coronavirus 2 by RT PCR NEGATIVE NEGATIVE Final    Comment: (NOTE) SARS-CoV-2 target nucleic acids are NOT DETECTED.  The SARS-CoV-2 RNA is generally detectable in upper respiratory specimens during the acute phase of infection. The lowest concentration of SARS-CoV-2 viral copies this assay can detect is 138 copies/mL. A negative result does not preclude SARS-Cov-2 infection and should not be used as the sole basis for treatment or other patient management decisions. A negative result may occur with  improper specimen collection/handling, submission of specimen other than nasopharyngeal swab, presence of viral mutation(s) within the areas targeted by this assay, and inadequate number of viral copies(<138 copies/mL). A negative result must be combined with clinical observations, patient history, and epidemiological information. The expected result is Negative.  Fact Sheet for Patients:  EntrepreneurPulse.com.au  Fact Sheet for Healthcare Providers:  IncredibleEmployment.be  This test is no t yet approved or cleared by the [place] FDA and  has been authorized for detection and/or  diagnosis of SARS-CoV-2 by FDA under an Emergency Use Authorization (EUA). This EUA will remain  in effect (meaning this test can be used) for the duration of  the COVID-19 declaration under Section 564(b)(1) of the Act, 21 U.S.C.section 360bbb-3(b)(1), unless the authorization is terminated  or revoked sooner.       Influenza A by PCR NEGATIVE NEGATIVE Final   Influenza B by PCR NEGATIVE NEGATIVE Final    Comment: (NOTE) The Xpert Xpress SARS-CoV-2/FLU/RSV plus assay is intended as an aid in the diagnosis of influenza from Nasopharyngeal swab specimens and should not be used as a sole basis for treatment. Nasal washings and aspirates are unacceptable for Xpert Xpress SARS-CoV-2/FLU/RSV testing.  Fact Sheet for Patients: EntrepreneurPulse.com.au  Fact Sheet for Healthcare Providers: IncredibleEmployment.be  This test is not yet approved or cleared by the [place] FDA and has been authorized for detection and/or diagnosis of SARS-CoV-2 by FDA under an Emergency Use Authorization (EUA). This EUA will remain in effect (meaning this test can be used) for the duration of the COVID-19 declaration under Section 564(b)(1) of the Act, 21 U.S.C. section 360bbb-3(b)(1), unless the authorization is terminated or revoked.     Resp Syncytial Virus by PCR NEGATIVE NEGATIVE Final    Comment: (NOTE) Fact Sheet for Patients: EntrepreneurPulse.com.au  Fact Sheet for Healthcare Providers: IncredibleEmployment.be  This test is not yet approved or cleared by the [place] FDA and has been authorized for detection and/or diagnosis of SARS-CoV-2 by FDA under an Emergency Use Authorization (EUA). This EUA will remain in effect (meaning this test can be used) for the duration of the COVID-19 declaration under Section 564(b)(1) of the Act, 21 U.S.C. section 360bbb-3(b)(1), unless the authorization is terminated or revoked.  Performed at [facility], [place] [address], [place], [place] [address]   Respiratory (~20 pathogens) panel by PCR     Status: None   Collection  Time: 08/06/22 11:50 AM   Specimen: Nasopharyngeal Swab; Respiratory  Result Value Ref Range Status   Adenovirus NOT DETECTED NOT DETECTED Final   Coronavirus 229E NOT DETECTED NOT DETECTED Final    Comment: (NOTE) The Coronavirus on the Respiratory Panel, DOES NOT test for the novel  Coronavirus (2019 nCoV)    Coronavirus HKU1 NOT DETECTED NOT DETECTED Final   Coronavirus NL63 NOT DETECTED NOT DETECTED Final   Coronavirus OC43 NOT DETECTED NOT DETECTED Final   Metapneumovirus NOT DETECTED NOT DETECTED Final   Rhinovirus / Enterovirus NOT DETECTED NOT DETECTED Final   Influenza A NOT DETECTED NOT DETECTED Final   Influenza B NOT DETECTED NOT DETECTED Final   Parainfluenza Virus 1 NOT DETECTED NOT DETECTED Final   Parainfluenza Virus 2 NOT DETECTED NOT DETECTED Final   Parainfluenza Virus 3 NOT DETECTED NOT DETECTED Final   Parainfluenza Virus 4 NOT DETECTED NOT DETECTED Final   Respiratory Syncytial Virus NOT DETECTED NOT DETECTED Final   Bordetella pertussis NOT DETECTED NOT DETECTED Final   Bordetella Parapertussis NOT DETECTED NOT DETECTED Final   Chlamydophila pneumoniae NOT DETECTED NOT DETECTED Final   Mycoplasma pneumoniae NOT DETECTED NOT DETECTED Final    Comment: Performed at [facility], [place]. [address] [place], [place] [address]  MRSA Next Gen by PCR, Nasal     Status: None   Collection Time: 08/07/22  2:59 AM   Specimen: Nasal Mucosa; Nasal Swab  Result Value Ref Range Status   MRSA by PCR Next Gen NOT DETECTED NOT DETECTED Final    Comment: (  NOTE) The GeneXpert MRSA Assay (FDA approved for NASAL specimens only), is one component of a comprehensive MRSA colonization surveillance program. It is not intended to diagnose MRSA infection nor to guide or monitor treatment for MRSA infections. Test performance is not FDA approved in patients less than 28 years old. Performed at [facility], [place] [address], [place], [place] [address]   Culture, blood  (Routine X 2) w Reflex to ID Panel     Status: None   Collection Time: 08/07/22  4:17 PM   Specimen: BLOOD LEFT FOREARM  Result Value Ref Range Status   Specimen Description BLOOD LEFT FOREARM  Final   Special Requests   Final    BOTTLES DRAWN AEROBIC ONLY Blood Culture adequate volume   Culture   Final    NO GROWTH 5 DAYS Performed at [facility], [place] [address], [place], [place] [address]    Report Status 08/12/2022 FINAL  Final  Culture, blood (Routine X 2) w Reflex to ID Panel     Status: None   Collection Time: 08/07/22  4:18 PM   Specimen: BLOOD LEFT HAND  Result Value Ref Range Status   Specimen Description BLOOD LEFT HAND  Final   Special Requests   Final    BOTTLES DRAWN AEROBIC AND ANAEROBIC Blood Culture adequate volume   Culture   Final    NO GROWTH 5 DAYS Performed at [facility], [place] [address], [place], [place] [address]    Report Status 08/12/2022 FINAL  Final  Aerobic/Anaerobic Culture w Gram Stain (surgical/deep wound)     Status: None (Preliminary result)   Collection Time: 08/12/22  2:52 PM   Specimen: PATH Soft tissue  Result Value Ref Range Status   Specimen Description WOUND LEFT ARM  Final   Special Requests AV GRAFT  Final   Gram Stain   Final    RARE WBC PRESENT,BOTH PMN AND MONONUCLEAR RARE GRAM POSITIVE COCCI Performed at [facility], [place] [address], [place], [place] [address]    Culture   Final    RARE STAPHYLOCOCCUS AUREUS NO ANAEROBES ISOLATED; CULTURE IN PROGRESS FOR 5 DAYS    Report Status PENDING  Incomplete   Organism ID, Bacteria STAPHYLOCOCCUS AUREUS  Final      Susceptibility   Staphylococcus aureus - MIC*    CIPROFLOXACIN <=0.5 SENSITIVE Sensitive     ERYTHROMYCIN >=8 RESISTANT Resistant     GENTAMICIN <=0.5 SENSITIVE Sensitive     OXACILLIN <=0.25 SENSITIVE Sensitive     TETRACYCLINE <=1 SENSITIVE Sensitive     VANCOMYCIN 1 SENSITIVE Sensitive     TRIMETH/SULFA <=10 SENSITIVE Sensitive     CLINDAMYCIN  RESISTANT Resistant     RIFAMPIN <=0.5 SENSITIVE Sensitive     Inducible Clindamycin POSITIVE Resistant     * RARE STAPHYLOCOCCUS AUREUS   Pertinent Lab.    Latest Ref Rng & Units 08/13/2022    1:41 AM 08/12/2022   10:56 AM 08/09/2022    3:13 AM  CBC  WBC 4.0 - 10.5 K/uL 7.5   8.0   Hemoglobin 12.0 - 15.0 g/dL 10.2  10.2  9.2   Hematocrit 36.0 - 46.0 % 31.0  30.0  28.0   Platelets 150 - 400 K/uL 353   109       Latest Ref Rng & Units 08/14/2022    9:46 PM 08/14/2022   12:18 PM 08/13/2022    2:21 PM  CMP  Glucose 70 - 99  mg/dL  141  388   BUN 6 - 20 mg/dL  54  37   Creatinine 0.44 - 1.00 mg/dL  8.49  6.69   Sodium 135 - 145 mmol/L  136  132   Potassium 3.5 - 5.1 mmol/L  4.3  4.6   Chloride 98 - 111 mmol/L  92  88   CO2 22 - 32 mmol/L  27  17   Calcium 8.9 - 10.3 mg/dL  9.2  8.9   Total Protein 6.5 - 8.1 g/dL 6.8     Total Bilirubin 0.3 - 1.2 mg/dL 0.5     Alkaline Phos 38 - 126 U/L 68     AST 15 - 41 U/L 26     ALT 0 - 44 U/L 5       Pertinent Imaging today Plain films and CT images have been personally visualized and interpreted; radiology reports have been reviewed. Decision making incorporated into the Impression / Recommendations.  No results found.  I spent 51 minutes for this patient encounter including review of prior medical records, coordination of care with primary/other specialist with greater than 50% of time being face to face/counseling and discussing diagnostics/treatment plan with the patient/family.  Electronically signed by:   Rosiland Oz, MD Infectious Disease Physician [facility] for Infectious Disease Pager: [phone]

## 2022-08-16 ENCOUNTER — Telehealth: Payer: Self-pay | Admitting: Nurse Practitioner

## 2022-08-16 DIAGNOSIS — N2581 Secondary hyperparathyroidism of renal origin: Secondary | ICD-10-CM | POA: Diagnosis not present

## 2022-08-16 DIAGNOSIS — Z992 Dependence on renal dialysis: Secondary | ICD-10-CM | POA: Diagnosis not present

## 2022-08-16 DIAGNOSIS — N186 End stage renal disease: Secondary | ICD-10-CM | POA: Diagnosis not present

## 2022-08-16 NOTE — Telephone Encounter (Signed)
Transition of care contact from inpatient facility  Date of Discharge: 08/15/2022 Date of Contact: 08/16/2022 Method of contact: Phone  Attempted to contact patient to discuss transition of care from inpatient admission. Patient did not answer the phone. Message was left on the patient's voicemail with call back number [phone].

## 2022-08-17 LAB — AEROBIC/ANAEROBIC CULTURE W GRAM STAIN (SURGICAL/DEEP WOUND)

## 2022-08-18 ENCOUNTER — Encounter: Payer: Self-pay | Admitting: Infectious Diseases

## 2022-08-18 NOTE — Anesthesia Postprocedure Evaluation (Signed)
Anesthesia Post Note  Patient: [name]  Procedure(s) Performed: TRANSESOPHAGEAL ECHOCARDIOGRAM (TEE)     Patient location during evaluation: PACU Anesthesia Type: MAC Level of consciousness: awake and alert Pain management: pain level controlled Vital Signs Assessment: post-procedure vital signs reviewed and stable Respiratory status: spontaneous breathing, nonlabored ventilation and respiratory function stable Cardiovascular status: blood pressure returned to baseline Postop Assessment: no apparent nausea or vomiting Anesthetic complications: no   No notable events documented.  Last Vitals:  Vitals:   08/15/22 1244 08/15/22 1600  BP: (!) 168/72   Pulse: 85   Resp: 16   Temp: 36.8 C   SpO2: 93% 95%    Last Pain:  Vitals:   08/15/22 1244  TempSrc: Oral  PainSc: 0-No pain                 Marthenia Rolling

## 2022-08-18 NOTE — Progress Notes (Signed)
ID brief note     Component 6 d ago  Specimen Description WOUND LEFT ARM  Special Requests AV GRAFT  Gram Stain RARE WBC PRESENT,BOTH PMN AND MONONUCLEAR RARE GRAM POSITIVE COCCI  Culture RARE STAPHYLOCOCCUS AUREUS NO ANAEROBES ISOLATED Performed at [facility], [place] [address], [place], [place] [address]  Report Status 08/17/2022 FINAL  Organism ID, Bacteria STAPHYLOCOCCUS AUREUS  Resulting Agency [facility]     Susceptibility   Staphylococcus aureus    MIC    CIPROFLOXACIN <=0.5 SENSI... Sensitive    CLINDAMYCIN RESISTANT Resistant    ERYTHROMYCIN >=8 RESISTANT Resistant    GENTAMICIN <=0.5 SENSI... Sensitive    Inducible Clindamycin POSITIVE Resistant    OXACILLIN <=0.25 SENS... Sensitive    RIFAMPIN <=0.5 SENSI... Sensitive    TETRACYCLINE <=1 SENSITIVE Sensitive    TRIMETH/SULFA <=10 SENSIT... Sensitive    VANCOMYCIN 1 SENSITIVE Sensitive

## 2022-08-19 DIAGNOSIS — N2581 Secondary hyperparathyroidism of renal origin: Secondary | ICD-10-CM | POA: Diagnosis not present

## 2022-08-19 DIAGNOSIS — R7881 Bacteremia: Secondary | ICD-10-CM | POA: Diagnosis not present

## 2022-08-19 DIAGNOSIS — N186 End stage renal disease: Secondary | ICD-10-CM | POA: Diagnosis not present

## 2022-08-19 DIAGNOSIS — Z992 Dependence on renal dialysis: Secondary | ICD-10-CM | POA: Diagnosis not present

## 2022-08-20 DIAGNOSIS — E1029 Type 1 diabetes mellitus with other diabetic kidney complication: Secondary | ICD-10-CM | POA: Diagnosis not present

## 2022-08-20 DIAGNOSIS — N186 End stage renal disease: Secondary | ICD-10-CM | POA: Diagnosis not present

## 2022-08-20 DIAGNOSIS — Z992 Dependence on renal dialysis: Secondary | ICD-10-CM | POA: Diagnosis not present

## 2022-08-21 DIAGNOSIS — N186 End stage renal disease: Secondary | ICD-10-CM | POA: Diagnosis not present

## 2022-08-21 DIAGNOSIS — Z992 Dependence on renal dialysis: Secondary | ICD-10-CM | POA: Diagnosis not present

## 2022-08-21 DIAGNOSIS — N2581 Secondary hyperparathyroidism of renal origin: Secondary | ICD-10-CM | POA: Diagnosis not present

## 2022-08-22 DIAGNOSIS — E1022 Type 1 diabetes mellitus with diabetic chronic kidney disease: Secondary | ICD-10-CM | POA: Diagnosis not present

## 2022-08-23 DIAGNOSIS — N186 End stage renal disease: Secondary | ICD-10-CM | POA: Diagnosis not present

## 2022-08-23 DIAGNOSIS — Z992 Dependence on renal dialysis: Secondary | ICD-10-CM | POA: Diagnosis not present

## 2022-08-23 DIAGNOSIS — N2581 Secondary hyperparathyroidism of renal origin: Secondary | ICD-10-CM | POA: Diagnosis not present

## 2022-08-26 DIAGNOSIS — Z992 Dependence on renal dialysis: Secondary | ICD-10-CM | POA: Diagnosis not present

## 2022-08-26 DIAGNOSIS — N186 End stage renal disease: Secondary | ICD-10-CM | POA: Diagnosis not present

## 2022-08-26 DIAGNOSIS — N2581 Secondary hyperparathyroidism of renal origin: Secondary | ICD-10-CM | POA: Diagnosis not present

## 2022-08-27 NOTE — Discharge Summary (Signed)
Physician Discharge Summary   Patient: [name] MRN: [id number] DOB: [birth year]  Admit date:     08/06/2022  Discharge date: 08/15/2022  Discharge Physician: Hosie Poisson   PCP: Benito Mccreedy, MD   Recommendations at discharge:  Please follow up with PCp In one week.  Please follow up with ID as recommended.   Discharge Diagnoses: Principal Problem:   Staphylococcus aureus bacteremia Active Problems:   ESRD on hemodialysis ([place])   Infection of AV graft for dialysis ([place])   Essential hypertension, benign   DM (diabetes mellitus) ([place])   History of CVA (cerebrovascular accident)   Pneumonia, staphylococcal ([place])  Resolved Problems:   CAP (community acquired pneumonia)   Acute respiratory failure with hypoxia [facility])  Hospital Course: [name] is a 47 yo female with PMH ESRD on HD, HTN, HLD, anxiety, DM II, CVA who presented with shortness of breath, weakness.  CXR on admission was concerning for right middle lobe opacity consistent with pneumonia.  She was started on Rocephin and azithromycin. After admission, we were informed that outpatient blood cultures from 08/05/2022 became positive for GPC, 4/4 bottles which have now speciated to MSSA. Cultures were obtained due to chills she developed during dialysis session.  She has Ongoing tenderness and swelling in her left arm at site of prior graft placement.  She underwent  entire excision  of the left arm axillary AV loop graft and vein patch angioplasty of the left axillary artery on 08/12/2022 with vascular surgery.  She underwent TEE and she does not have any endocarditis . She was discharged home.   Assessment and Plan:  MSSA bacteremia:  Currently on IV ancef.  TTE is negative for vegetations.  TEE scheduled for 1/26.  ID on board and appreciate recommendations. Waiting for TEE to determine the duration of antibiotics.        Infected LUE graft:  Vascular surgery consulted. She underwent  entire excision  of  the left arm axillary AV loop graft and vein patch angioplasty of the left axillary artery on 08/12/2022 with vascular surgery.  Incision site clean.  Follow up the cultures.       Acute respiratory failure with hypoxia from CAP;  Resolved. She is on RA .      ESRD on HD;  Further management as per nephrology.        Right ankle pain:  CT right ankle obtained to rule out septic joint; mostly shows arthritis changes  Resolved.    Insulin dependent DM:  Resume home meds.    H/o CVA:  Resume aspirin.      Hypertension:  Well controlled.      Thrombocytopenia:  Resolved.      Transaminitis:  Improving. Check liver panel today.      Nausea and vomiting  Resolved.               Consultants: nephrology ID  Cardiology.  Procedures performed: tee  Disposition: Home Diet recommendation:  Renal diet DISCHARGE MEDICATION: Allergies as of 08/15/2022       Reactions   Pollen Extract Other (See Comments)   Watery eyes        Medication List     TAKE these medications    amLODipine 10 MG tablet Commonly known as: NORVASC Take 10 mg by mouth in the morning.   aspirin EC 81 MG tablet Take 81 mg by mouth in the morning.   ceFAZolin  IVPB Commonly known as: ANCEF Inject 2 g into the  vein Every Tuesday,Thursday,and Saturday with dialysis. Indication:  MSSA AVG infection First Dose: Yes Last Day of Therapy:  09/23/22 Labs - Once weekly:  CBC/D and BMP, Labs - Every other week:  ESR and CRP Method of administration: IV Push Method of administration may be changed at the discretion of home infusion pharmacist based upon assessment of the patient and/or caregiver's ability to self-administer the medication ordered.   FLINTSTONES GUMMIES PO Take 2 tablets by mouth in the morning.   fluticasone 50 MCG/ACT nasal spray Commonly known as: FLONASE Place 1-2 sprays into both nostrils daily as needed for allergies.   Fosrenol 1000 MG Pack Generic drug:  Lanthanum Carbonate Take 2,000 mg by mouth with breakfast, with lunch, and with evening meal.   labetalol 300 MG tablet Commonly known as: NORMODYNE Take 300 mg by mouth in the morning.   loratadine 10 MG tablet Commonly known as: CLARITIN Take 10 mg by mouth daily as needed for allergies.   NovoLOG 100 UNIT/ML injection Generic drug: insulin aspart Inject 150 Units into the skin See admin instructions. 150 units over 7 days   rosuvastatin 10 MG tablet Commonly known as: CRESTOR Take 10 mg by mouth at bedtime.               Discharge Care Instructions  (From admission, onward)           Start     Ordered   08/15/22 0000  Change dressing on IV access line weekly and PRN  (Home infusion instructions - [facility] )        08/15/22 1622            Follow-up Information     [facility] [place] Follow up.   Specialty: Home Health Services Contact information: [place] [address] [facility] Follow up in 4 week(s).   Specialty: Vascular Surgery Why: Office will call you to arrange your appt (sent). Contact information: [address] [place] [phone]        Benito Mccreedy, MD. Schedule an appointment as soon as possible for a visit in 1 week(s).   Specialty: Internal Medicine Contact information: [address] [address] [phone]                Discharge Exam: Filed Weights   08/14/22 1802 08/15/22 0519 08/15/22 1300  Weight: 56.1 kg 57.1 kg 54.1 kg   General exam: Appears calm and comfortable  Respiratory system: Clear to auscultation. Respiratory effort normal. Cardiovascular system: S1 & S2 heard, RRR. No JVD, murmurs, rubs, gallops or clicks. No pedal edema. Gastrointestinal system: Abdomen is nondistended, soft and nontender.  Central nervous system: Alert and oriented. No  focal neurological deficits. Extremities: Symmetric 5 x 5 power. Skin: No rashes, lesions or ulcers Psychiatry: Judgement and insight appear normal. Mood & affect appropriate.    Condition at discharge: fair  The results of significant diagnostics from this hospitalization (including imaging, microbiology, ancillary and laboratory) are listed below for reference.   Imaging Studies: ECHO TEE  Result Date: 08/15/2022    TRANSESOPHOGEAL ECHO REPORT   Patient Name:   LYVIA MONDESIR Date of Exam: 08/15/2022 Medical Rec #:  [id number]         Height:       63.0 in Accession #:    [id number]  Weight:       125.9 lb Date of Birth:  15-Sep-1975        BSA:          1.588 m Patient Age:    33 years          BP:           164/76 mmHg Patient Gender: F                 HR:           84 bpm. Exam Location:  Inpatient Procedure: Transesophageal Echo, Cardiac Doppler and Color Doppler Indications:     Bacteremia  History:         Patient has prior history of Echocardiogram examinations.                  Pulmonary HTN; Risk Factors:Hypertension, Diabetes,                  Dyslipidemia and Current Smoker.  Sonographer:     Clayton Lefort RDCS (AE) Referring Phys:  [id number] [place] [facility] Diagnosing Phys: [place] PROCEDURE: After discussion of the risks and benefits of a TEE, an informed consent was obtained from the patient. The transesophogeal probe was passed without difficulty through the esophogus of the patient. Local oropharyngeal anesthetic was provided with Cetacaine. Sedation performed by different physician. The patient was monitored while under deep sedation. Anesthestetic sedation was provided intravenously by Anesthesiology: 190mg  of Propofol. Image quality was good. The patient developed no complications during the procedure.  IMPRESSIONS  1. Left ventricular ejection fraction, by estimation, is 55 to 60%. The left ventricle has normal function.  2. No left atrial/left atrial appendage thrombus  was detected.  3. The mitral valve is normal in structure. No evidence of mitral valve regurgitation.  4. Tricuspid valve regurgitation is moderate.  5. The aortic valve is tricuspid. Aortic valve regurgitation is not visualized. Conclusion(s)/Recommendation(s): No evidence of vegetation/infective endocarditis on this transesophageael echocardiogram. FINDINGS  Left Ventricle: Left ventricular ejection fraction, by estimation, is 55 to 60%. The left ventricle has normal function. Left Atrium: No left atrial/left atrial appendage thrombus was detected. Mitral Valve: The mitral valve is normal in structure. No evidence of mitral valve regurgitation. Tricuspid Valve: The tricuspid valve is normal in structure. Tricuspid valve regurgitation is moderate. Aortic Valve: The aortic valve is tricuspid. Aortic valve regurgitation is not visualized. Pulmonic Valve: The pulmonic valve was normal in structure. Pulmonic valve regurgitation is mild. Aorta: There is minimal (Grade I) plaque. IAS/Shunts: No atrial level shunt detected by color flow Doppler.  TRICUSPID VALVE TR Peak grad:   46.2 mmHg TR Vmax:        340.00 cm/s Phineas Inches Electronically signed by Phineas Inches Signature Date/Time: 08/15/2022/1:51:00 PM    Final    DG Orthopantogram  Result Date: 08/12/2022 CLINICAL DATA:  Left jaw pain EXAM: ORTHOPANTOGRAM/PANORAMIC COMPARISON:  02/13/2020 FINDINGS: No mandibular fracture or dislocation. No lytic or blastic bone lesion. Visualized paranasal sinuses are clear. Teeth 1, 2 14, 16, 17, and 29-32 are absent. Dental caries noted within the posterior crown of tooth 18 and likely within the base of tooth 5. No periapical lucency identified. IMPRESSION: 1. No mandibular fracture or dislocation. 2. Dental caries as described above. Electronically Signed   By: Fidela Salisbury M.D.   On: 08/12/2022 04:05   ECHOCARDIOGRAM COMPLETE  Result Date: 08/08/2022    ECHOCARDIOGRAM REPORT   Patient Name:   [name]  Date of  Exam: 08/08/2022 Medical Rec #:  [id number]         Height:       63.0 in Accession #:    [id number]        Weight:       121.3 lb Date of Birth:  Jul 14, 1976        BSA:          1.563 m Patient Age:    31 years          BP:           107/61 mmHg Patient Gender: F                 HR:           76 bpm. Exam Location:  Inpatient Procedure: 2D Echo, Cardiac Doppler and Color Doppler Indications:    bacteremia  History:        Patient has prior history of Echocardiogram examinations. CHF;                 Risk Factors:Diabetes, Dyslipidemia and Hypertension.  Sonographer:    Phineas Douglas Referring Phys: [place]  1. Left ventricular ejection fraction, by estimation, is 55 to 60%. The left ventricle has normal function. The left ventricle has no regional wall motion abnormalities. There is mild left ventricular hypertrophy. Left ventricular diastolic parameters are consistent with Grade II diastolic dysfunction (pseudonormalization). Elevated left atrial pressure.  2. Right ventricular systolic function is normal. The right ventricular size is mildly enlarged. There is moderately elevated pulmonary artery systolic pressure. The estimated right ventricular systolic pressure is 70.4 mmHg.  3. Left atrial size was mildly dilated.  4. Right atrial size was mildly dilated.  5. The mitral valve is normal in structure. Trivial mitral valve regurgitation.  6. Tricuspid valve regurgitation is moderate.  7. The aortic valve is tricuspid. Aortic valve regurgitation is not visualized. Aortic valve sclerosis is present, with no evidence of aortic valve stenosis.  8. The inferior vena cava is dilated in size with <50% respiratory variability, suggesting right atrial pressure of 15 mmHg. Conclusion(s)/Recommendation(s): No evidence of valvular vegetations on this transthoracic echocardiogram. Consider a transesophageal echocardiogram to exclude infective endocarditis if clinically indicated. FINDINGS  Left  Ventricle: Left ventricular ejection fraction, by estimation, is 55 to 60%. The left ventricle has normal function. The left ventricle has no regional wall motion abnormalities. The left ventricular internal cavity size was normal in size. There is  mild left ventricular hypertrophy. Left ventricular diastolic parameters are consistent with Grade II diastolic dysfunction (pseudonormalization). Elevated left atrial pressure. Right Ventricle: The right ventricular size is mildly enlarged. Right vetricular wall thickness was not well visualized. Right ventricular systolic function is normal. There is moderately elevated pulmonary artery systolic pressure. The tricuspid regurgitant velocity is 3.33 m/s, and with an assumed right atrial pressure of 15 mmHg, the estimated right ventricular systolic pressure is 88.8 mmHg. Left Atrium: Left atrial size was mildly dilated. Right Atrium: Right atrial size was mildly dilated. Pericardium: Trivial pericardial effusion is present. Mitral Valve: The mitral valve is normal in structure. Trivial mitral valve regurgitation. Tricuspid Valve: The tricuspid valve is normal in structure. Tricuspid valve regurgitation is moderate. Aortic Valve: The aortic valve is tricuspid. Aortic valve regurgitation is not visualized. Aortic valve sclerosis is present, with no evidence of aortic valve stenosis. Pulmonic Valve: The pulmonic valve was not well visualized. Pulmonic valve regurgitation is trivial. Aorta: The aortic root and ascending  aorta are structurally normal, with no evidence of dilitation. Venous: The inferior vena cava is dilated in size with less than 50% respiratory variability, suggesting right atrial pressure of 15 mmHg. IAS/Shunts: The interatrial septum was not well visualized.  LEFT VENTRICLE PLAX 2D LVIDd:         4.00 cm      Diastology LVIDs:         3.00 cm      LV e' medial:    6.13 cm/s LV PW:         1.20 cm      LV E/e' medial:  19.4 LV IVS:        1.20 cm      LV e'  lateral:   9.20 cm/s LVOT diam:     1.60 cm      LV E/e' lateral: 12.9 LV SV:         53 LV SV Index:   34 LVOT Area:     2.01 cm  LV Volumes (MOD) LV vol d, MOD A2C: 129.0 ml LV vol d, MOD A4C: 122.0 ml LV vol s, MOD A2C: 60.2 ml LV vol s, MOD A4C: 53.7 ml LV SV MOD A2C:     68.8 ml LV SV MOD A4C:     122.0 ml LV SV MOD BP:      72.0 ml RIGHT VENTRICLE             IVC RV Basal diam:  4.30 cm     IVC diam: 2.40 cm RV S prime:     13.70 cm/s TAPSE (M-mode): 1.6 cm LEFT ATRIUM             Index        RIGHT ATRIUM           Index LA diam:        3.80 cm 2.43 cm/m   RA Area:     18.70 cm LA Vol (A2C):   60.4 ml 38.64 ml/m  RA Volume:   56.70 ml  36.27 ml/m LA Vol (A4C):   59.8 ml 38.26 ml/m LA Biplane Vol: 60.1 ml 38.45 ml/m  AORTIC VALVE             PULMONIC VALVE LVOT Vmax:   129.00 cm/s PR End Diast Vel: 5.66 msec LVOT Vmean:  86.900 cm/s LVOT VTI:    0.262 m  AORTA Ao Root diam: 2.50 cm Ao Asc diam:  2.90 cm MITRAL VALVE                TRICUSPID VALVE MV Area (PHT): 4.17 cm     TR Peak grad:   44.4 mmHg MV Decel Time: 182 msec     TR Vmax:        333.00 cm/s MV E velocity: 119.00 cm/s MV A velocity: 91.80 cm/s   SHUNTS MV E/A ratio:  1.30         Systemic VTI:  0.26 m                             Systemic Diam: 1.60 cm Oswaldo Milian MD Electronically signed by Oswaldo Milian MD Signature Date/Time: 08/08/2022/4:25:03 PM    Final    CT ANKLE RIGHT W CONTRAST  Result Date: 08/07/2022 CLINICAL DATA:  Bacteremia, right ankle pain EXAM: CT OF THE RIGHT ANKLE WITH CONTRAST TECHNIQUE: Multidetector CT imaging of the right ankle was performed following the standard  protocol during bolus administration of intravenous contrast. RADIATION DOSE REDUCTION: This exam was performed according to the departmental dose-optimization program which includes automated exposure control, adjustment of the mA and/or kV according to patient size and/or use of iterative reconstruction technique. CONTRAST:  33mL  OMNIPAQUE IOHEXOL 350 MG/ML SOLN COMPARISON:  None Available. FINDINGS: Bones/Joint/Cartilage No fracture or acute bony findings. There is some degenerative loss of articular space at the calcaneocuboid articulation. No tibiotalar joint effusion or substantial effusion of the distal tibiofibular articulation. No effacement of the adipose tissue in the sinus tarsi. No subtalar joint effusion is appreciated. Ligaments Suboptimally assessed by CT. Muscles and Tendons No significant tenosynovitis observed. Soft tissues Atherosclerosis noted.  Mild subcutaneous edema along the heel. IMPRESSION: 1. Mild degenerative loss of articular space at the calcaneocuboid articulation. 2. Mild subcutaneous edema along the heel. 3. Atherosclerosis. Electronically Signed   By: Van Clines M.D.   On: 08/07/2022 21:36   DG Chest 2 View  Result Date: 08/06/2022 CLINICAL DATA:  Shortness of breath EXAM: CHEST - 2 VIEW COMPARISON:  Chest x-ray March 08, 2021 FINDINGS: Unchanged cardiomegaly. Stable mediastinal contours. Confluent opacity in the right middle lobe. Moderate right pleural effusion. No focal opacity in the left lung. No large pneumothorax. The visualized upper abdomen is unremarkable. No acute osseous abnormality. IMPRESSION: 1. Right middle lobe confluent opacity, concerning for pneumonia. Recommend treatment of acute illness and repeat chest radiograph in 6-8 weeks to ensure resolution and exclude underlying malignancy. 2. Moderate right pleural effusion. Electronically Signed   By: Beryle Flock M.D.   On: 08/06/2022 13:22    Microbiology: Results for orders placed or performed during the hospital encounter of 08/06/22  Resp panel by RT-PCR (RSV, Flu A&B, Covid) Anterior Nasal Swab     Status: None   Collection Time: 08/06/22 11:50 AM   Specimen: Anterior Nasal Swab  Result Value Ref Range Status   SARS Coronavirus 2 by RT PCR NEGATIVE NEGATIVE Final    Comment: (NOTE) SARS-CoV-2 target nucleic  acids are NOT DETECTED.  The SARS-CoV-2 RNA is generally detectable in upper respiratory specimens during the acute phase of infection. The lowest concentration of SARS-CoV-2 viral copies this assay can detect is 138 copies/mL. A negative result does not preclude SARS-Cov-2 infection and should not be used as the sole basis for treatment or other patient management decisions. A negative result may occur with  improper specimen collection/handling, submission of specimen other than nasopharyngeal swab, presence of viral mutation(s) within the areas targeted by this assay, and inadequate number of viral copies(<138 copies/mL). A negative result must be combined with clinical observations, patient history, and epidemiological information. The expected result is Negative.  Fact Sheet for Patients:  EntrepreneurPulse.com.au  Fact Sheet for Healthcare Providers:  IncredibleEmployment.be  This test is no t yet approved or cleared by the [place] FDA and  has been authorized for detection and/or diagnosis of SARS-CoV-2 by FDA under an Emergency Use Authorization (EUA). This EUA will remain  in effect (meaning this test can be used) for the duration of the COVID-19 declaration under Section 564(b)(1) of the Act, 21 U.S.C.section 360bbb-3(b)(1), unless the authorization is terminated  or revoked sooner.       Influenza A by PCR NEGATIVE NEGATIVE Final   Influenza B by PCR NEGATIVE NEGATIVE Final    Comment: (NOTE) The Xpert Xpress SARS-CoV-2/FLU/RSV plus assay is intended as an aid in the diagnosis of influenza from Nasopharyngeal swab specimens and should not be used as a  sole basis for treatment. Nasal washings and aspirates are unacceptable for Xpert Xpress SARS-CoV-2/FLU/RSV testing.  Fact Sheet for Patients: EntrepreneurPulse.com.au  Fact Sheet for Healthcare Providers: IncredibleEmployment.be  This  test is not yet approved or cleared by the [place] FDA and has been authorized for detection and/or diagnosis of SARS-CoV-2 by FDA under an Emergency Use Authorization (EUA). This EUA will remain in effect (meaning this test can be used) for the duration of the COVID-19 declaration under Section 564(b)(1) of the Act, 21 U.S.C. section 360bbb-3(b)(1), unless the authorization is terminated or revoked.     Resp Syncytial Virus by PCR NEGATIVE NEGATIVE Final    Comment: (NOTE) Fact Sheet for Patients: EntrepreneurPulse.com.au  Fact Sheet for Healthcare Providers: IncredibleEmployment.be  This test is not yet approved or cleared by the [place] FDA and has been authorized for detection and/or diagnosis of SARS-CoV-2 by FDA under an Emergency Use Authorization (EUA). This EUA will remain in effect (meaning this test can be used) for the duration of the COVID-19 declaration under Section 564(b)(1) of the Act, 21 U.S.C. section 360bbb-3(b)(1), unless the authorization is terminated or revoked.  Performed at [facility], [place] [address], [place], [place] [address]   Respiratory (~20 pathogens) panel by PCR     Status: None   Collection Time: 08/06/22 11:50 AM   Specimen: Nasopharyngeal Swab; Respiratory  Result Value Ref Range Status   Adenovirus NOT DETECTED NOT DETECTED Final   Coronavirus 229E NOT DETECTED NOT DETECTED Final    Comment: (NOTE) The Coronavirus on the Respiratory Panel, DOES NOT test for the novel  Coronavirus (2019 nCoV)    Coronavirus HKU1 NOT DETECTED NOT DETECTED Final   Coronavirus NL63 NOT DETECTED NOT DETECTED Final   Coronavirus OC43 NOT DETECTED NOT DETECTED Final   Metapneumovirus NOT DETECTED NOT DETECTED Final   Rhinovirus / Enterovirus NOT DETECTED NOT DETECTED Final   Influenza A NOT DETECTED NOT DETECTED Final   Influenza B NOT DETECTED NOT DETECTED Final   Parainfluenza Virus 1 NOT DETECTED NOT  DETECTED Final   Parainfluenza Virus 2 NOT DETECTED NOT DETECTED Final   Parainfluenza Virus 3 NOT DETECTED NOT DETECTED Final   Parainfluenza Virus 4 NOT DETECTED NOT DETECTED Final   Respiratory Syncytial Virus NOT DETECTED NOT DETECTED Final   Bordetella pertussis NOT DETECTED NOT DETECTED Final   Bordetella Parapertussis NOT DETECTED NOT DETECTED Final   Chlamydophila pneumoniae NOT DETECTED NOT DETECTED Final   Mycoplasma pneumoniae NOT DETECTED NOT DETECTED Final    Comment: Performed at [facility], [place]. [address] [place], [place] [address]  MRSA Next Gen by PCR, Nasal     Status: None   Collection Time: 08/07/22  2:59 AM   Specimen: Nasal Mucosa; Nasal Swab  Result Value Ref Range Status   MRSA by PCR Next Gen NOT DETECTED NOT DETECTED Final    Comment: (NOTE) The GeneXpert MRSA Assay (FDA approved for NASAL specimens only), is one component of a comprehensive MRSA colonization surveillance program. It is not intended to diagnose MRSA infection nor to guide or monitor treatment for MRSA infections. Test performance is not FDA approved in patients less than 86 years old. Performed at [facility], [place] [address], [place], [place] [address]   Culture, blood (Routine X 2) w Reflex to ID Panel     Status: None   Collection Time: 08/07/22  4:17 PM   Specimen: BLOOD LEFT FOREARM  Result Value Ref Range Status  Specimen Description BLOOD LEFT FOREARM  Final   Special Requests   Final    BOTTLES DRAWN AEROBIC ONLY Blood Culture adequate volume   Culture   Final    NO GROWTH 5 DAYS Performed at [facility], [address], [place], [place] [address]    Report Status 08/12/2022 FINAL  Final  Culture, blood (Routine X 2) w Reflex to ID Panel     Status: None   Collection Time: 08/07/22  4:18 PM   Specimen: BLOOD LEFT HAND  Result Value Ref Range Status   Specimen Description BLOOD LEFT HAND  Final   Special Requests   Final    BOTTLES DRAWN AEROBIC AND  ANAEROBIC Blood Culture adequate volume   Culture   Final    NO GROWTH 5 DAYS Performed at [facility], [place] [address] [place], [place] [address]    Report Status 08/12/2022 FINAL  Final  Aerobic/Anaerobic Culture w Gram Stain (surgical/deep wound)     Status: None   Collection Time: 08/12/22  2:52 PM   Specimen: PATH Soft tissue  Result Value Ref Range Status   Specimen Description WOUND LEFT ARM  Final   Special Requests AV GRAFT  Final   Gram Stain   Final    RARE WBC PRESENT,BOTH PMN AND MONONUCLEAR RARE GRAM POSITIVE COCCI    Culture   Final    RARE STAPHYLOCOCCUS AUREUS NO ANAEROBES ISOLATED Performed at [facility], [place] [address] [place] [address] [place], [place] [address]    Report Status 08/17/2022 FINAL  Final   Organism ID, Bacteria STAPHYLOCOCCUS AUREUS  Final      Susceptibility   Staphylococcus aureus - MIC*    CIPROFLOXACIN <=0.5 SENSITIVE Sensitive     ERYTHROMYCIN >=8 RESISTANT Resistant     GENTAMICIN <=0.5 SENSITIVE Sensitive     OXACILLIN <=0.25 SENSITIVE Sensitive     TETRACYCLINE <=1 SENSITIVE Sensitive     VANCOMYCIN 1 SENSITIVE Sensitive     TRIMETH/SULFA <=10 SENSITIVE Sensitive     CLINDAMYCIN RESISTANT Resistant     RIFAMPIN <=0.5 SENSITIVE Sensitive     Inducible Clindamycin POSITIVE Resistant     * RARE STAPHYLOCOCCUS AUREUS    Labs: CBC: No results for input(s): "WBC", "NEUTROABS", "HGB", "HCT", "MCV", "PLT" in the last 168 hours. Basic Metabolic Panel: No results for input(s): "NA", "K", "CL", "CO2", "GLUCOSE", "BUN", "CREATININE", "CALCIUM", "MG", "PHOS" in the last 168 hours. Liver Function Tests: No results for input(s): "AST", "ALT", "ALKPHOS", "BILITOT", "PROT", "ALBUMIN" in the last 168 hours. CBG: No results for input(s): "GLUCAP" in the last 168 hours.  Discharge time spent: 39 minutes.   Signed: Hosie Poisson, MD [facility]

## 2022-08-28 DIAGNOSIS — N186 End stage renal disease: Secondary | ICD-10-CM | POA: Diagnosis not present

## 2022-08-28 DIAGNOSIS — Z992 Dependence on renal dialysis: Secondary | ICD-10-CM | POA: Diagnosis not present

## 2022-08-28 DIAGNOSIS — N2581 Secondary hyperparathyroidism of renal origin: Secondary | ICD-10-CM | POA: Diagnosis not present

## 2022-08-30 DIAGNOSIS — N186 End stage renal disease: Secondary | ICD-10-CM | POA: Diagnosis not present

## 2022-08-30 DIAGNOSIS — N2581 Secondary hyperparathyroidism of renal origin: Secondary | ICD-10-CM | POA: Diagnosis not present

## 2022-08-30 DIAGNOSIS — Z992 Dependence on renal dialysis: Secondary | ICD-10-CM | POA: Diagnosis not present

## 2022-09-02 DIAGNOSIS — N186 End stage renal disease: Secondary | ICD-10-CM | POA: Diagnosis not present

## 2022-09-02 DIAGNOSIS — Z992 Dependence on renal dialysis: Secondary | ICD-10-CM | POA: Diagnosis not present

## 2022-09-02 DIAGNOSIS — N2581 Secondary hyperparathyroidism of renal origin: Secondary | ICD-10-CM | POA: Diagnosis not present

## 2022-09-04 DIAGNOSIS — N186 End stage renal disease: Secondary | ICD-10-CM | POA: Diagnosis not present

## 2022-09-04 DIAGNOSIS — N2581 Secondary hyperparathyroidism of renal origin: Secondary | ICD-10-CM | POA: Diagnosis not present

## 2022-09-04 DIAGNOSIS — Z992 Dependence on renal dialysis: Secondary | ICD-10-CM | POA: Diagnosis not present

## 2022-09-06 DIAGNOSIS — N186 End stage renal disease: Secondary | ICD-10-CM | POA: Diagnosis not present

## 2022-09-06 DIAGNOSIS — Z992 Dependence on renal dialysis: Secondary | ICD-10-CM | POA: Diagnosis not present

## 2022-09-06 DIAGNOSIS — N2581 Secondary hyperparathyroidism of renal origin: Secondary | ICD-10-CM | POA: Diagnosis not present

## 2022-09-09 DIAGNOSIS — N186 End stage renal disease: Secondary | ICD-10-CM | POA: Diagnosis not present

## 2022-09-09 DIAGNOSIS — Z992 Dependence on renal dialysis: Secondary | ICD-10-CM | POA: Diagnosis not present

## 2022-09-09 DIAGNOSIS — N2581 Secondary hyperparathyroidism of renal origin: Secondary | ICD-10-CM | POA: Diagnosis not present

## 2022-09-10 ENCOUNTER — Ambulatory Visit (INDEPENDENT_AMBULATORY_CARE_PROVIDER_SITE_OTHER): Payer: Medicare HMO | Admitting: Physician Assistant

## 2022-09-10 ENCOUNTER — Encounter: Payer: Self-pay | Admitting: Physician Assistant

## 2022-09-10 VITALS — BP 216/99 | HR 84 | Temp 98.2°F | Ht 63.0 in | Wt 121.0 lb

## 2022-09-10 DIAGNOSIS — Z992 Dependence on renal dialysis: Secondary | ICD-10-CM

## 2022-09-10 DIAGNOSIS — E1022 Type 1 diabetes mellitus with diabetic chronic kidney disease: Secondary | ICD-10-CM | POA: Diagnosis not present

## 2022-09-10 DIAGNOSIS — T827XXA Infection and inflammatory reaction due to other cardiac and vascular devices, implants and grafts, initial encounter: Secondary | ICD-10-CM

## 2022-09-10 DIAGNOSIS — N186 End stage renal disease: Secondary | ICD-10-CM

## 2022-09-10 NOTE — Progress Notes (Signed)
    Postoperative Access Visit   History of Present Illness   [name] is a 47 y.o. year old female who presents for postoperative follow-up for: excision of left arm axillary AV loop graft, harvest of left basilic vein and vein angioplasty of left axillary artery by Dr. Donzetta Matters on 08/12/22. This was performed secondary to nonfunctioning AV graft that was infected due to MSSA bacteremia. The patient's wounds are healing very well.  She reports only a little pain around incisions and occasional sharp pains that shoot down into her left hand. She is having some numbness and coldness radiating into her left hand since the surgery. She has been wearing a glove on her hand to  help with this.She otherwise denies any weakness or pain in her hand. She is receiving IV Ancef with dialysis 3x/ week. This will continue through 09/23/22 per ID. She reports that her right arm graft is functioning well  She currently dialyzes via RUE AV graft on TTS in [place]   Physical Examination   Vitals:   09/10/22 0944  BP: (!) 216/99  Pulse: 84  Temp: 98.2 F (36.8 C)  TempSrc: Temporal  SpO2: 96%  Weight: 121 lb (54.9 kg)  Height: 5' 3"$  (1.6 m)   Body mass index is 21.43 kg/m.  left arm Incisions are healing very well. Staples in place, 1+ radial pulse, hand grip is 5/5, sensation in digits is intact. Left hand warm. palpable thrill in RUE graft    Medical Decision Making   [name] is a 47 y.o. year old female who presents s/p excision of left arm axillary AV loop graft, harvest of left basilic vein and vein angioplasty of left axillary artery by Dr. Donzetta Matters on 08/12/22. This was performed secondary to nonfunctioning AV graft that was infected due to MSSA bacteremia. She is on IV abx at dialysis 3x/ week through 09/23/22. Her left arm incisions are healing very nicely. Staples removed today. Patent is having some numbness, coldness and pain in her left arm and hand. I discussed with her that hopefully  with continued healing this will improve but it is possible that it may not with all her prior interventions she has had on left arm. She has requested outpatient OT referral. I will make this referral for her today to  help with hand strengthening. She can otherwise follow up with [place] as needed if she has any incisional concerns of issues with function of her right arm graft.  Karoline Caldwell, PA-C [facility] Office: [phone]  Clinic MD: Yetta Barre

## 2022-09-11 DIAGNOSIS — N186 End stage renal disease: Secondary | ICD-10-CM | POA: Diagnosis not present

## 2022-09-11 DIAGNOSIS — N2581 Secondary hyperparathyroidism of renal origin: Secondary | ICD-10-CM | POA: Diagnosis not present

## 2022-09-11 DIAGNOSIS — Z992 Dependence on renal dialysis: Secondary | ICD-10-CM | POA: Diagnosis not present

## 2022-09-13 DIAGNOSIS — N186 End stage renal disease: Secondary | ICD-10-CM | POA: Diagnosis not present

## 2022-09-13 DIAGNOSIS — Z992 Dependence on renal dialysis: Secondary | ICD-10-CM | POA: Diagnosis not present

## 2022-09-13 DIAGNOSIS — N2581 Secondary hyperparathyroidism of renal origin: Secondary | ICD-10-CM | POA: Diagnosis not present

## 2022-09-15 ENCOUNTER — Inpatient Hospital Stay: Payer: Medicare HMO | Admitting: Infectious Diseases

## 2022-09-15 NOTE — Therapy (Signed)
OUTPATIENT OCCUPATIONAL THERAPY ORTHO EVALUATION  Patient Name: [name] MRN: [id number] DOB:August 10, 1975, 47 y.o., female Today's Date: 09/16/2022  PCP: Dr. Julio Sicks REFERRING PROVIDER: Graceann Congress PA-C  END OF SESSION:  OT End of Session - 09/16/22 1558     Visit Number 1    Number of Visits 17    Date for OT Re-Evaluation 11/18/22    Authorization Type humana, MCD    Authorization Time Period 9 weeks    Authorization - Visit Number 1    Progress Note Due on Visit 10    OT Start Time 1402    OT Stop Time 1442    OT Time Calculation (min) 40 min             Past Medical History:  Diagnosis Date   Anemia    Arthritis    Ascites 03/28/2020   Bacteremia 02/11/2020   Cerebral infarction due to unspecified mechanism 05/22/2014   CHF (congestive heart failure) (HCC)    Diabetes mellitus    Diabetic retinopathy (HCC)    Hx of laser Rx's   DM type 1 (diabetes mellitus, type 1) (HCC) 09/12/2014   Age of onset for DM type 1 was age 49.      Enlarged thyroid gland    ESRD on hemodialysis (HCC)    ESRD due to DM type I, age of onset DM 1 was age 25.  Went on dialysis in March 2012.  Gets HD now at [facility] on a TTS schedule.     ESRD on hemodialysis (HCC) 09/12/2014   ESRD due to DM type 1.  Started HD in 2012 at [facility].  Gets HD there on a TTS schedule.     GERD (gastroesophageal reflux disease)    HCAP (healthcare-associated pneumonia) 09/11/2014   Hemorrhage from arteriovenous dialysis graft (HCC) 10/20/2019   History of blood transfusion    Hyperlipidemia    Hypertension    Hypertensive emergency 05/22/2014   Peripheral vascular disease (HCC)    Pneumonia    Past Surgical History:  Procedure Laterality Date   A/V FISTULAGRAM Right 10/31/2020   Procedure: A/V FISTULAGRAM;  Surgeon: Leonie Douglas, MD;  Location: [facility];  Service: Cardiovascular;  Laterality: Right;   ARTERIOVENOUS GRAFT PLACEMENT     ARTERY REPAIR Left 12/10/2012    Procedure: BRACHIAL ARTERY REPAIR;  Surgeon: Chuck Hint, MD;  Location: [facility] OR;  Service: Vascular;  Laterality: Left;  Exploration Left Brachial Artery for AVF   AV FISTULA PLACEMENT Right 01/11/2018   Procedure: INSERTION OF ARTERIOVENOUS (AV) GORE-TEX GRAFT  UPPER ARM;  Surgeon: Chuck Hint, MD;  Location: [facility] OR;  Service: Vascular;  Laterality: Right;   AVGG REMOVAL Left 08/12/2022   Procedure: LEFT ARM ARTERIOVENOUS GRAFT EXCISION WITH VEIN PATCH ANGIOPLASTY;  Surgeon: Maeola Harman, MD;  Location: [facility] OR;  Service: Vascular;  Laterality: Left;   BASCILIC VEIN TRANSPOSITION Right 09/28/2017   Procedure: FIRST STAGE BASILIC VEIN TRANSPOSITION;  Surgeon: Chuck Hint, MD;  Location: [facility] OR;  Service: Vascular;  Laterality: Right;   CESAREAN SECTION     CHEST TUBE INSERTION Right 06/01/2020   Procedure: CHEST TUBE INSERTION;  Surgeon: Josephine Igo, DO;  Location: [facility] ENDOSCOPY;  Service: Pulmonary;  Laterality: Right;   CHEST TUBE INSERTION Right 07/04/2020   Procedure: INSERTION PLEURAL DRAINAGE CATHETER;  Surgeon: Loreli Slot, MD;  Location: [facility] OR;  Service: Thoracic;  Laterality: Right;   EYE SURGERY  LAzer   EYE SURGERY Left    IR THORACENTESIS ASP PLEURAL SPACE W/IMG GUIDE  05/31/2020   IR THORACENTESIS ASP PLEURAL SPACE W/IMG GUIDE  06/22/2020   REVISION OF ARTERIOVENOUS GORETEX GRAFT Left 08/01/2016   Procedure: REVISION OF ARTERIOVENOUS GORETEX GRAFT;  Surgeon: Chuck Hint, MD;  Location: [facility] OR;  Service: Vascular;  Laterality: Left;   REVISION OF ARTERIOVENOUS GORETEX GRAFT Right 10/21/2019   Procedure: REVISION OF ARTERIOVENOUS GORETEX GRAFT ARM;  Surgeon: Larina Earthly, MD;  Location: [facility] OR;  Service: Vascular;  Laterality: Right;   RIGHT HEART CATH N/A 08/01/2020   Procedure: RIGHT HEART CATH;  Surgeon: Dolores Patty, MD;  Location: [facility];  Service: Cardiovascular;  Laterality: N/A;   SHUNTOGRAM Left  11/30/2013   Procedure: Betsey Amen;  Surgeon: Nada Libman, MD;  Location: [facility] CATH LAB;  Service: Cardiovascular;  Laterality: Left;   TEE WITHOUT CARDIOVERSION N/A 02/13/2020   Procedure: TRANSESOPHAGEAL ECHOCARDIOGRAM (TEE);  Surgeon: Wendall Stade, MD;  Location: [facility] ENDOSCOPY;  Service: Cardiovascular;  Laterality: N/A;   TEE WITHOUT CARDIOVERSION N/A 08/15/2022   Procedure: TRANSESOPHAGEAL ECHOCARDIOGRAM (TEE);  Surgeon: Maisie Fus, MD;  Location: [facility] ENDOSCOPY;  Service: Cardiovascular;  Laterality: N/A;   Patient Active Problem List   Diagnosis Date Noted   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) 08/11/2022   Staphylococcus aureus bacteremia 08/08/2022   Pneumonia, staphylococcal (HCC) 08/08/2022   Visit for wound check 07/26/2020   Pleural effusion    Difficulty breathing 05/30/2020   Exudative pleural effusion    Gram-positive bacteremia 02/11/2020   History of CVA (cerebrovascular accident) 03/11/2017   Hyperparathyroidism, secondary renal (HCC)    ESRD on hemodialysis (HCC) 09/12/2014   Diabetes mellitus type I (HCC) 09/12/2014   History of diabetic retinopathy 09/12/2014   Cerebral infarction due to unspecified mechanism 05/22/2014   Essential hypertension, benign 05/22/2014   DM (diabetes mellitus) (HCC) 05/22/2014   HLD (hyperlipidemia) 05/22/2014   Cerebral infarction (HCC) 03/29/2014   Facial droop 03/29/2014   Mechanical complication of other vascular device, implant, and graft 01/12/2013   Anxiety disorder, unspecified 05/11/2012    ONSET DATE: 09/10/22-referral date  REFERRING DIAG:    R29.898 (ICD-10-CM) - Left hand weakness    THERAPY DIAG:  Localized edema - Plan: Ot plan of care cert/re-cert  Pain in left hand - Plan: Ot plan of care cert/re-cert  Pain in left arm - Plan: Ot plan of care cert/re-cert  Stiffness of left hand, not elsewhere classified - Plan: Ot plan of care cert/re-cert  Stiffness of left elbow, not elsewhere classified -  Plan: Ot plan of care cert/re-cert  Stiffness of left shoulder, not elsewhere classified - Plan: Ot plan of care cert/re-cert  Other disturbances of skin sensation - Plan: Ot plan of care cert/re-cert  Other lack of coordination - Plan: Ot plan of care cert/re-cert  Muscle weakness (generalized) - Plan: Ot plan of care cert/re-cert  Rationale for Evaluation and Treatment: Rehabilitation  SUBJECTIVE:   SUBJECTIVE STATEMENT: To get the feeling back in my hand Pt accompanied by: family member  PERTINENT HISTORY:  s/p excision of left arm axillary AV loop graft, harvest of left basilic vein and vein angioplasty of left axillary artery by Dr. Randie Heinz on 08/12/22. This was performed secondary to nonfunctioning AV graft that was infected due to MSSA bacteremia. She is on IV abx at dialysis 3x/ week through 09/23/22. Patent is having some numbness, coldness and pain in her left arm  and hand. PMH ESRD on HD, HTN, HLD, anxiety, DM II, CVA   PRECAUTIONS: Other: dialysis , AV graft in RUE, on abx for infection  WEIGHT BEARING RESTRICTIONS: No  PAIN:  Are you having pain? Yes: NPRS scale: 5/10 Pain location: fingers to arm Pain description: aching, numb Aggravating factors: cold temps Relieving factors: movement  FALLS: Has patient fallen in last 6 months? No  LIVING ENVIRONMENT: Lives with: lives with their family Lives in: Other town house  PLOF: Independent  PATIENT GOALS:  to be able to use LUE better  NEXT MD VISIT: unknown  OBJECTIVE:   HAND DOMINANCE: Right  ADLs: Overall ADLs: increased time required, difficulty using LUE Transfers/ambulation related to ADLs: Eating: pt is not able to cut normally. Grooming: needs assist with styling hair UB Dressing: mod I with pullover shirt, needs help with buttons LB Dressing:  needs assist with tying  Toileting: mod I Bathing: mod I with sponge bath, increased time Tub Shower transfers: currently sponge bathing due to  wound   FUNCTIONAL OUTCOME MEASURES: Quick Dash: TBA  UPPER EXTREMITY ROM:   RUE A/ROM WFLS.  Active ROM Right eval Left eval  Shoulder flexion  120  Shoulder abduction  80  Shoulder adduction    Shoulder extension    Shoulder internal rotation    Shoulder external rotation    Elbow flexion  130  Elbow extension  -25  Wrist flexion  55  Wrist extension  40  Wrist ulnar deviation    Wrist radial deviation    Wrist pronation  WFL  Wrist supination  90%   Left Composite finger flexion 50%, extension 90%, able to oppose digit 1 with Thumb  U)  HAND FUNCTION: Grip strength: Right: 5 lbs; Left: 45 lbs  COORDINATION: 9 Hole Peg test: Right: 26.30 sec; Left: 8 placed in 2 mins    SENSATION: Light touch: Impaired  Hot/Cold: Impaired   EDEMA: moderate in LUE   COGNITION: Overall cognitive status: Within functional limits for tasks assessed A OBSERVATIONS: Pt demonstrates hypersensitivity in LUE with guarding   TODAY'S TREATMENT:                                                                                                                              DATE: 09/16/22- Pt's blood sugar was low during OT eval( 53 initally). Pt was provided with a protein granola bar and fruit strip along with water. Pt's blood sugar re-bounded to 61. Pt felt much better at end of session.     PATIENT EDUCATION: Education details: tendon gliding exercise, and importance of elevating LUE while performing, importance of using LUE for light functional activities- pt practiced grasping water bottle to stabilize while opening. Pt was cautioned not to use LUE with anything hot, or sharp due to risk for injury. Pt was cautioned against use of heat due to infection. Person educated: Patient Education method: Explanation, Demonstration, Verbal cues, and Handouts Education comprehension: verbalized  understanding, returned demonstration, and verbal cues required  HOME EXERCISE PROGRAM: 09/16/22-  tendon gliding  GOALS: Goals reviewed with patient? Yes potential goals discussed  SHORT TERM GOALS: Target date: 10/14/22  I with initial HEP. Baseline:dependent Goal status: INITIAL  2.  Pt will increase LUE grip strength to 10 lbs or greater for increased functional use. Baseline: Grip strength: Right: 5 lbs; Left: 45 lbs Goal status: INITIAL  3.  Pt will demonstrate improved LUE fine motor as evidenced by decreasing 9 hole peg test to completing test in 2 mins or less. Baseline: 8 pegs placed in 2 mins with LUE Goal status: INITIAL  4.  Pt will consistently use LUE as a gross assist at least 25% of the time with pain less than or equal to 4/10. Baseline: uses LUE less than 10%, pain 5/10 Goal status: INITIAL  5.  Pt will demonstrate ability to retrieve a lightweight object at 125 shoulder flexion and -20 elbow extension without drops using LUE. Baseline: LUE shoulder flexion 120, elbow extension -25 Goal status: INITIAL  6.  Pt will perform all basic ADLS mod I. Baseline: Pt requires assist with tying shoes, buttons, styling hair Goal status: INITIAL  LONG TERM GOALS: Target date: 11/18/22  I with updated HEP Baseline: dependent Goal status: INITIAL  2.  Pt will increase LUE grip strength to 25 lbs or greater for increased functional use. Baseline: Grip strength: Right: 5 lbs; Left: 45 lbs Goal status: INITIAL  3.  Pt will demonstrate improved LUE fine motor for ADLS as evidenced by decreasing 9 hole peg test to completing test in 1 min 45 secs. Baseline: 8 pegs placed in 2 mins with LUE Goal status: INITIAL  4.  Pt will consistently use LUE as a gross assist at least 40% of the time with pain less than or equal to 4/10. Baseline: uses LUE less than 10%, pain 5/10 Goal status: INITIAL   5.  Test quick Dash and set goal prn Baseline: NT due to time constraints Goal status: INITIAL  6.  Pt will resume performance of light home management and cooking at a mod I  level. Baseline: dependent Goal status: INITIAL  ASSESSMENT:  CLINICAL IMPRESSION: Patient is a 47 y.o. female  who was seen today for occupational therapy evaluation for R29.898 (ICD-10-CM) - Left hand weakness.Pt.  s/p excision of left arm axillary AV loop graft, harvest of left basilic vein and vein angioplasty of left axillary artery by Dr. Randie Heinz on 08/12/22. This was performed secondary to nonfunctioning AV graft that was infected due to MSSA bacteremia. She is on IV abx at dialysis 3x/ week through 09/23/22. Patent is having some numbness, coldness and pain in her left arm and hand. PMH ESRD on HD, HTN, HLD, anxiety, DM II, CVA   PERFORMANCE DEFICITS: in functional skills including ADLs, IADLs, coordination, dexterity, sensation, edema, ROM, strength, pain, flexibility, Fine motor control, Gross motor control, decreased knowledge of precautions, decreased knowledge of use of DME, wound, skin integrity, and UE functional use, psychosocial skills including coping strategies, environmental adaptation, habits, interpersonal interactions, and routines and behaviors.   IMPAIRMENTS: are limiting patient from ADLs, IADLs, rest and sleep, play, leisure, and social participation.   COMORBIDITIES: may have co-morbidities  that affects occupational performance. Patient will benefit from skilled OT to address above impairments and improve overall function.  MODIFICATION OR ASSISTANCE TO COMPLETE EVALUATION: Min-Moderate modification of tasks or assist with assess necessary to complete an evaluation.  OT OCCUPATIONAL PROFILE AND HISTORY:  Detailed assessment: Review of records and additional review of physical, cognitive, psychosocial history related to current functional performance.  CLINICAL DECISION MAKING: LOW - limited treatment options, no task modification necessary  REHAB POTENTIAL: Good  EVALUATION COMPLEXITY: Low      PLAN:  OT FREQUENCY: 2x/week  OT DURATION: 8 weeks plus  eval  PLANNED INTERVENTIONS: self care/ADL training, therapeutic exercise, therapeutic activity, neuromuscular re-education, manual therapy, scar mobilization, manual lymph drainage, passive range of motion, splinting, ultrasound, paraffin, moist heat, cryotherapy, contrast bath, patient/family education, energy conservation, coping strategies training, DME and/or AE instructions, and Re-evaluation  RECOMMENDED OTHER SERVICES: n/a  CONSULTED AND AGREED WITH PLAN OF CARE: Patient  PLAN FOR NEXT SESSION: add to HEP, coordination HEP   Rodneshia Greenhouse, OT 09/16/2022, 4:34 PM

## 2022-09-16 ENCOUNTER — Ambulatory Visit: Payer: Medicare HMO | Attending: Physician Assistant | Admitting: Occupational Therapy

## 2022-09-16 DIAGNOSIS — M79602 Pain in left arm: Secondary | ICD-10-CM

## 2022-09-16 DIAGNOSIS — M25642 Stiffness of left hand, not elsewhere classified: Secondary | ICD-10-CM | POA: Diagnosis not present

## 2022-09-16 DIAGNOSIS — M6281 Muscle weakness (generalized): Secondary | ICD-10-CM

## 2022-09-16 DIAGNOSIS — N186 End stage renal disease: Secondary | ICD-10-CM | POA: Diagnosis not present

## 2022-09-16 DIAGNOSIS — R278 Other lack of coordination: Secondary | ICD-10-CM

## 2022-09-16 DIAGNOSIS — N2581 Secondary hyperparathyroidism of renal origin: Secondary | ICD-10-CM | POA: Diagnosis not present

## 2022-09-16 DIAGNOSIS — M25612 Stiffness of left shoulder, not elsewhere classified: Secondary | ICD-10-CM

## 2022-09-16 DIAGNOSIS — M79642 Pain in left hand: Secondary | ICD-10-CM

## 2022-09-16 DIAGNOSIS — R600 Localized edema: Secondary | ICD-10-CM | POA: Diagnosis not present

## 2022-09-16 DIAGNOSIS — R208 Other disturbances of skin sensation: Secondary | ICD-10-CM | POA: Diagnosis not present

## 2022-09-16 DIAGNOSIS — Z992 Dependence on renal dialysis: Secondary | ICD-10-CM | POA: Diagnosis not present

## 2022-09-16 DIAGNOSIS — M25622 Stiffness of left elbow, not elsewhere classified: Secondary | ICD-10-CM

## 2022-09-16 IMAGING — US US ABDOMEN COMPLETE
1 series · 13 of 25 positions shown · non-contrast
Comparison: Ultrasound 10/20/2018

CLINICAL DATA: Ascites.  Abdominal tenderness

EXAM:
ABDOMEN ULTRASOUND COMPLETE

[Series 1: us abdomen complete · 13 of 95 slices shown]
[im 1/95]
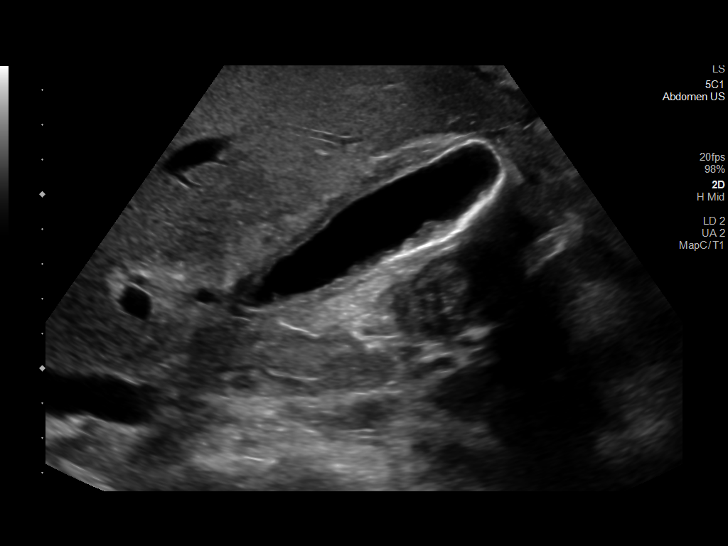
[im 8/95]
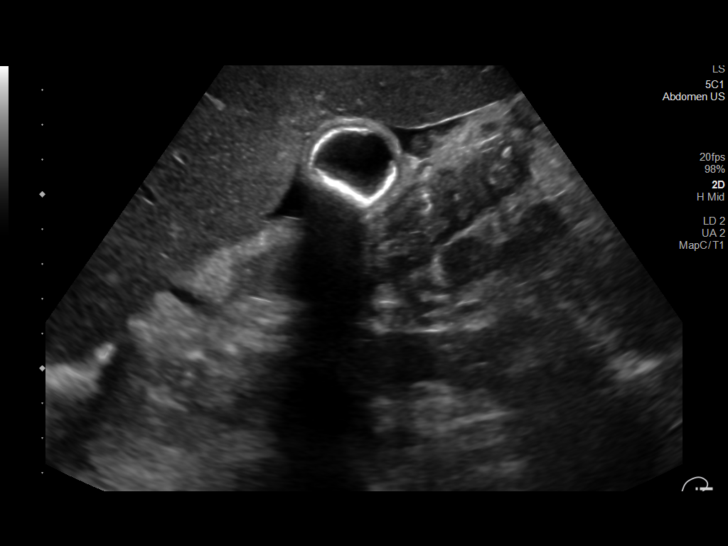
[im 16/95]
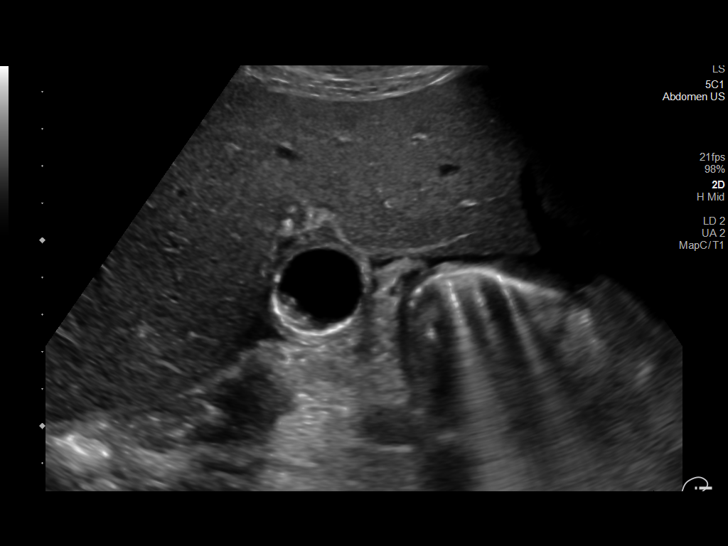
[im 24/95]
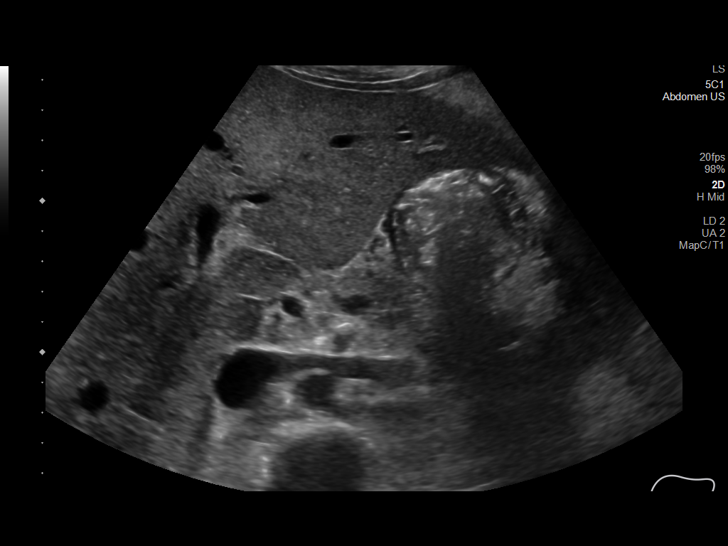
[im 32/95]
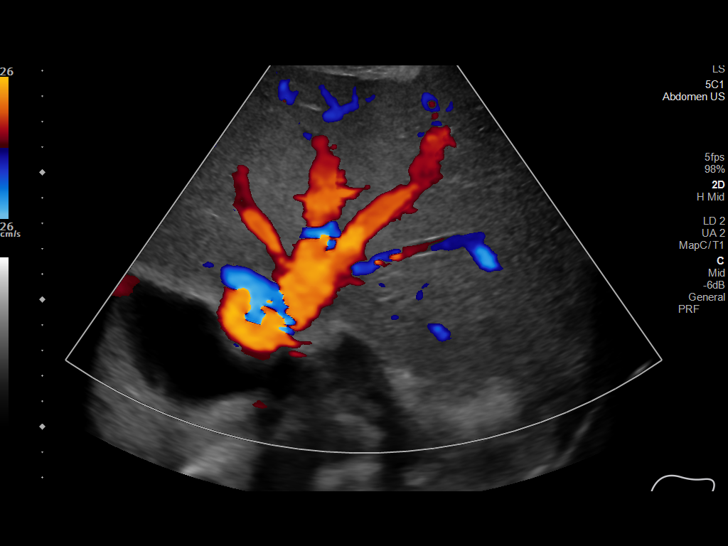
[im 40/95]
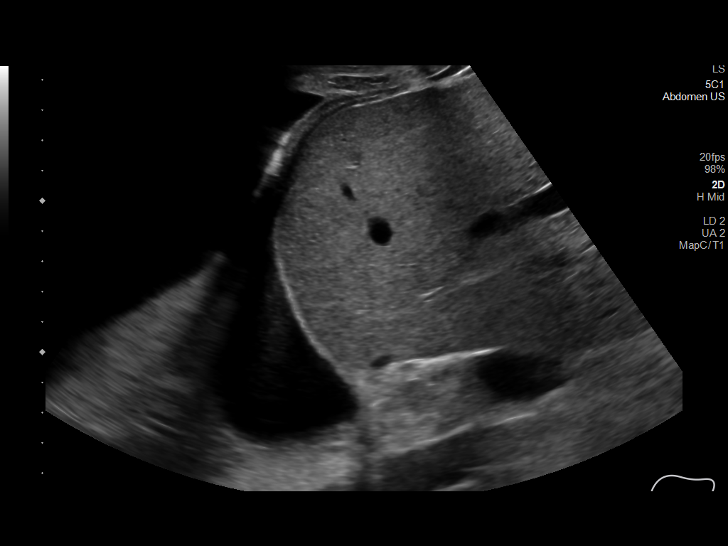
[im 48/95]
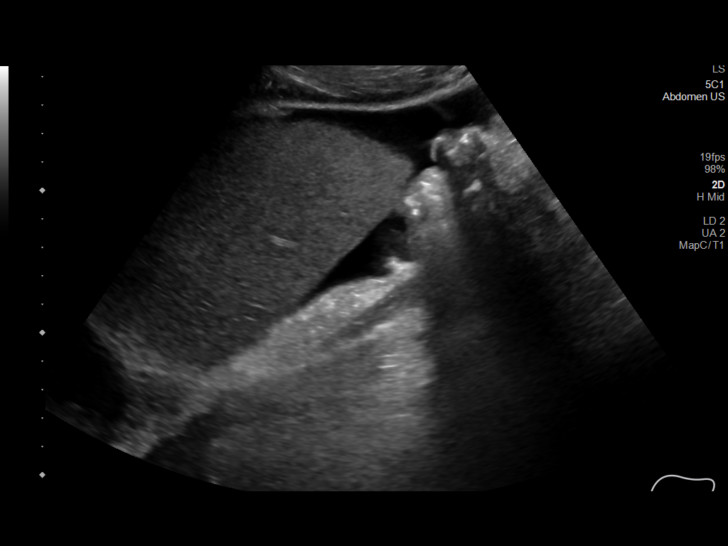
[im 55/95]
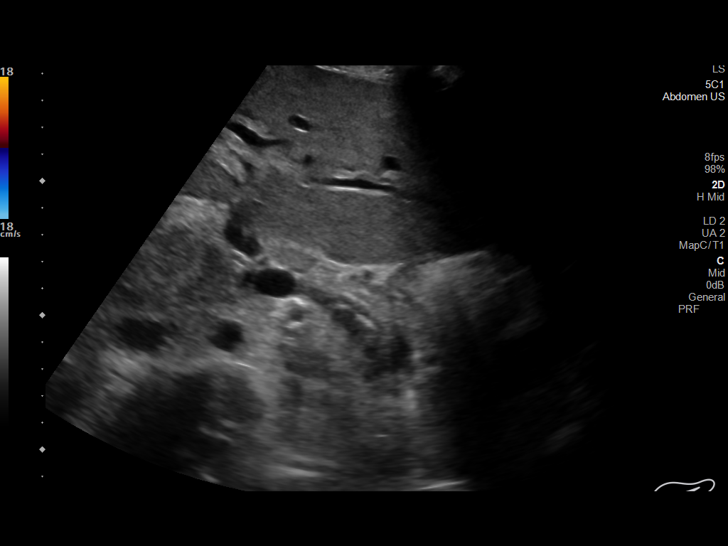
[im 63/95]
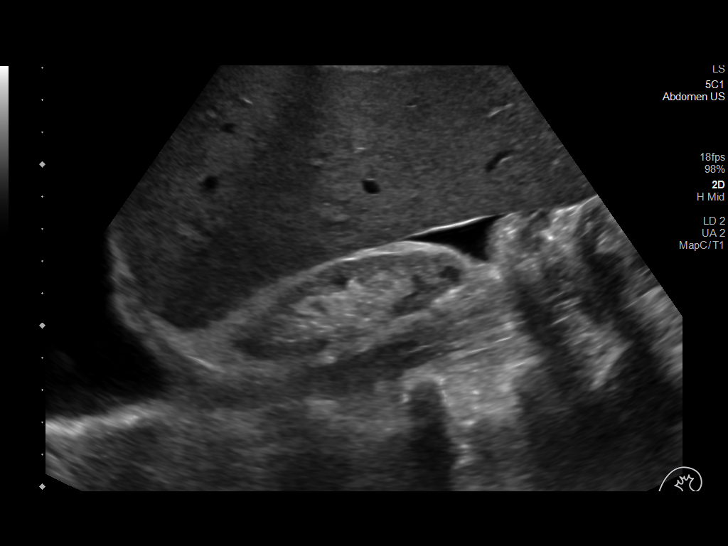
[im 71/95]
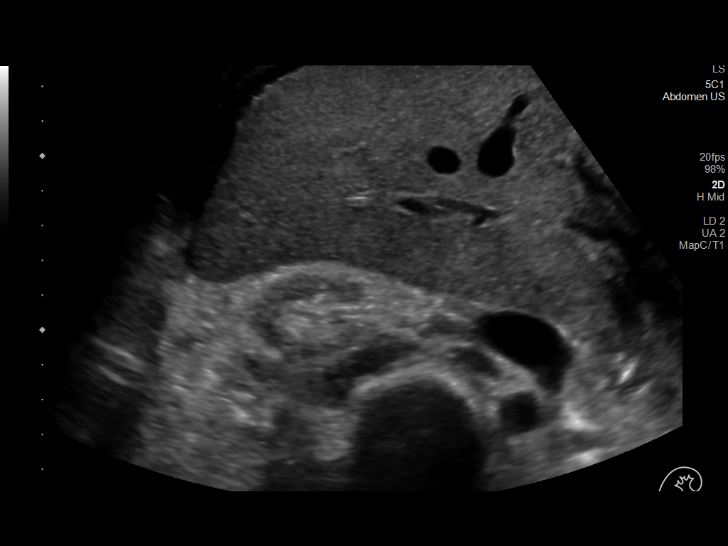
[im 79/95]
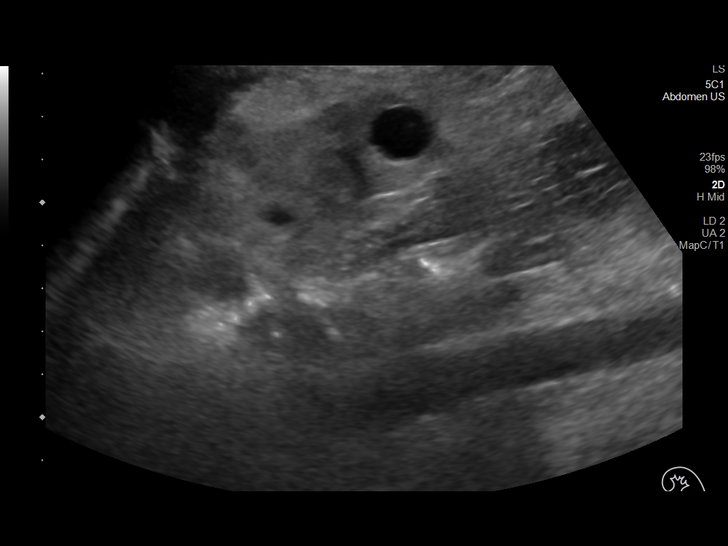
[im 87/95]
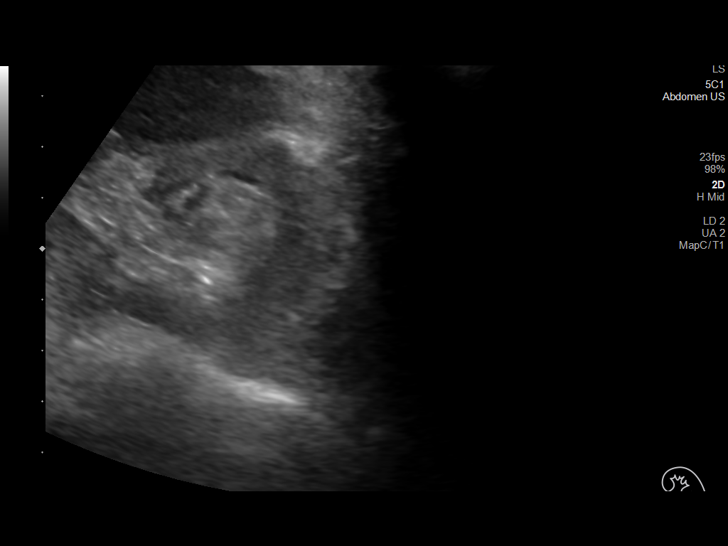
[im 95/95]
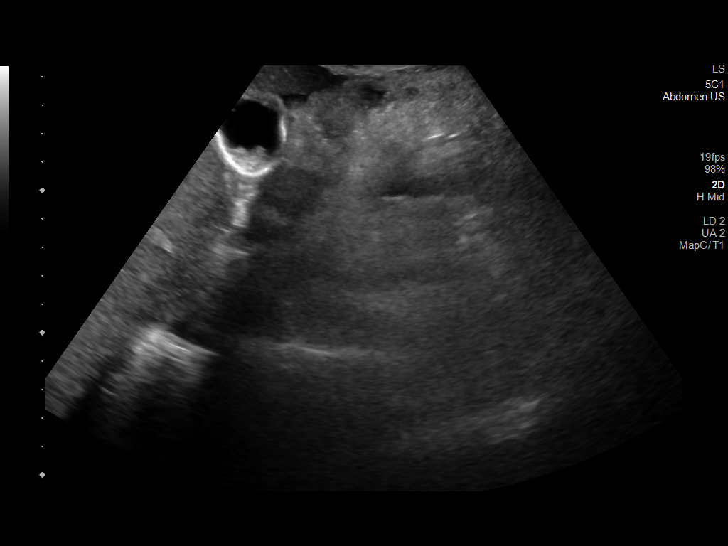

[13 of 25 positions shown; findings below may reference images not displayed]

FINDINGS: Gallbladder: Gallbladder is nondistended. There is a rim
calcification in the wall the gallbladder. There is gallbladder
sludge layering dependently. Significant gallbladder wall thickening
up to 6 8 mm. Negative sonographic Murphy's sign.

Common bile duct: Diameter: Normal at 3 mm

Liver: No focal lesion identified. Within normal limits in
parenchymal echogenicity. Portal vein is patent on color Doppler
imaging with normal direction of blood flow towards the liver.

IVC: No abnormality visualized.

Pancreas: Visualized portion unremarkable.

Spleen: Size and appearance within normal limits.

Right Kidney: Length: 7.7 cm. Echogenicity within normal limits. No
mass or hydronephrosis visualized.

Left Kidney: Length: 6.8 cm. Echogenicity within normal limits. No
mass or hydronephrosis visualized.

Abdominal aorta: No aneurysm visualized.

Other findings: Moderate volume RIGHT pleural effusion. Small volume
ascites.
IMPRESSION: 1. Abnormal gallbladder with significant gallbladder wall
thickening, calcifications in the wall, and sludge within the
gallbladder. Favor chronic findings. Consider chronic cholecystitis.
Negative sonographic Murphy's sign.
2. Moderate RIGHT pleural effusion ascites.

## 2022-09-16 NOTE — Patient Instructions (Addendum)
Flexor Tendon Gliding (Active Hook Fist)   With fingers and knuckles straight, bend middle and tip joints. Do not bend large knuckles. Repeat _10-15___ times. Do _4-6___ sessions per day.  MP Flexion (Active)   With back of hand on table, bend large knuckles as far as they will go, keeping small joints straight. Repeat _10-15___ times. Do __4-6__ sessions per day. Activity: Reach into a narrow container.*      Finger Flexion / Extension   With palm up, bend fingers of left hand toward palm, making a  fist. Straighten fingers, opening fist. Repeat sequence _10-15___ times per session. Do _4-6__ sessions per day. Hand Variation: Palm down   Copyright  VHI. All rights reserved.   Copyright  VHI. All rights reserved.

## 2022-09-17 IMAGING — DX DG CHEST 1V
1 series · 1 of 1 positions shown · non-contrast
Comparison: One day prior

CLINICAL DATA: Status post right-sided thoracentesis

EXAM:
CHEST  1 VIEW

[chest ap]
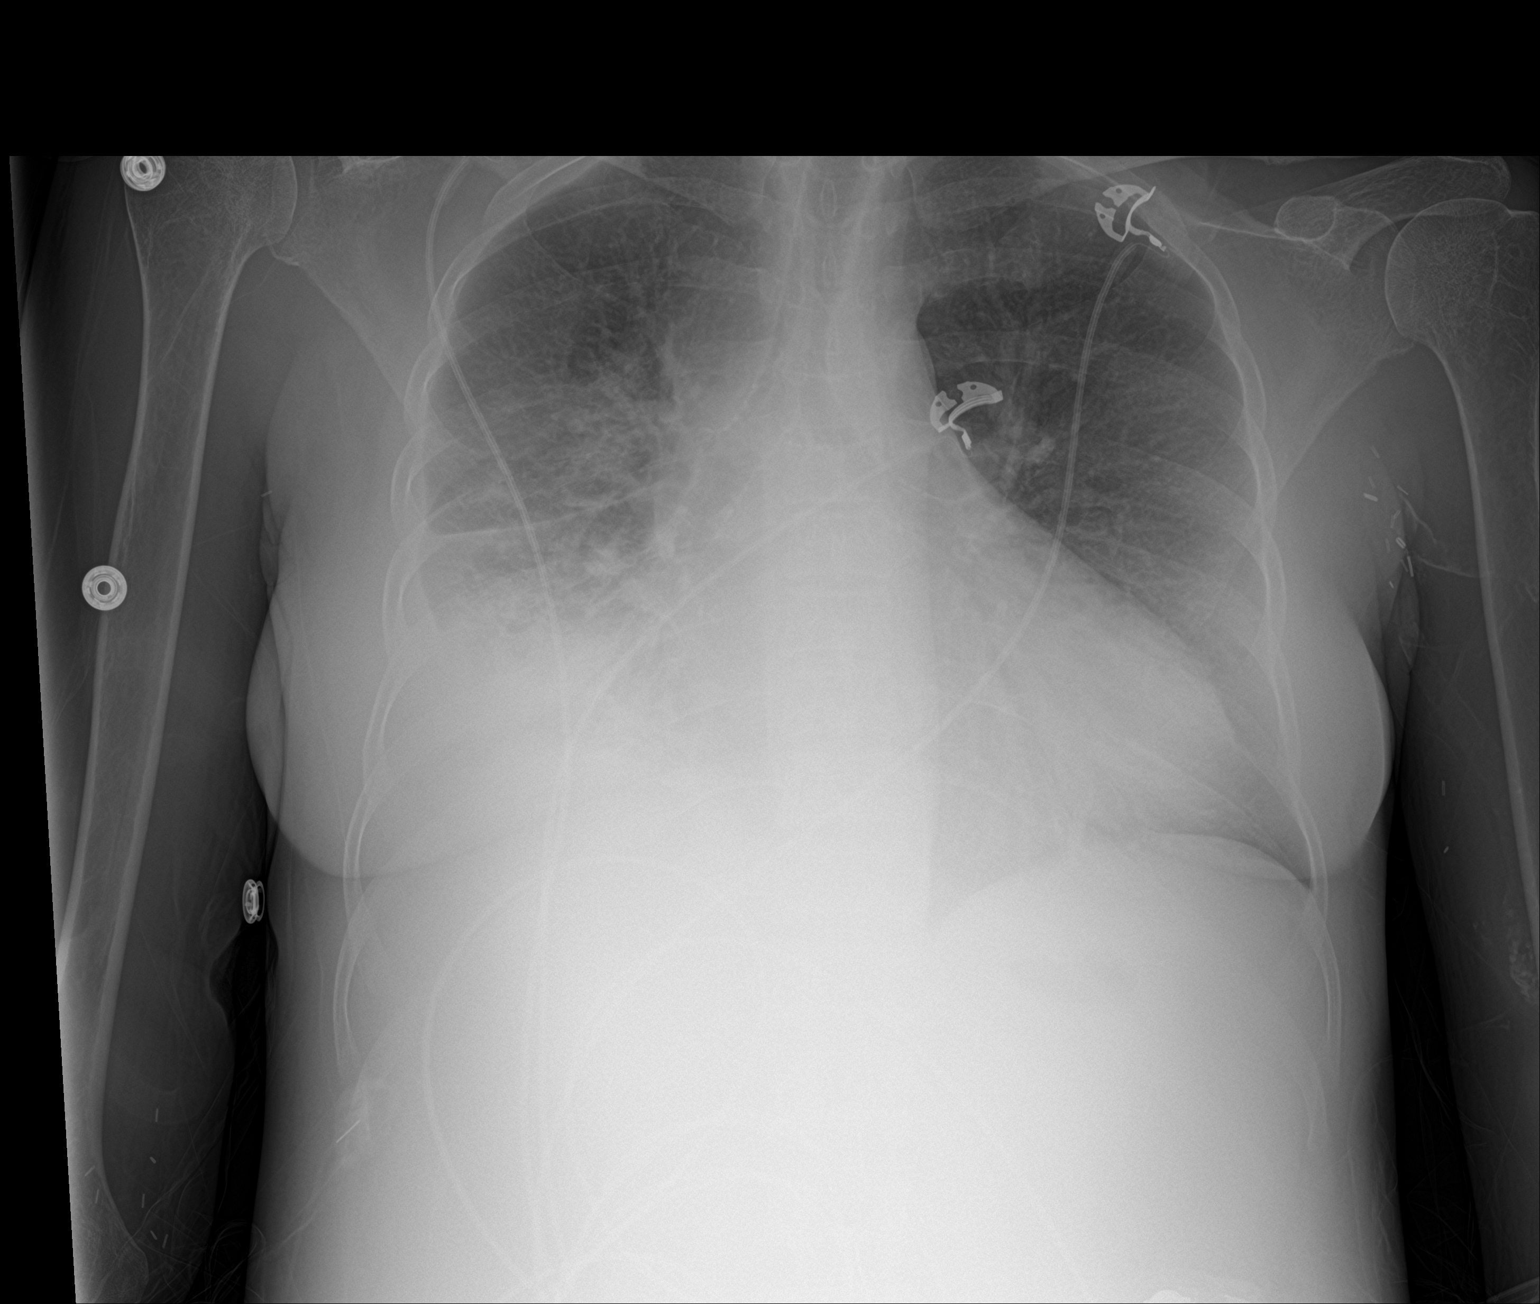

[1 of 1 positions shown; findings below may reference images not displayed]

FINDINGS: Left axillary surgical clips. Midline trachea. Mild cardiomegaly.
Decrease in small right pleural effusion. No pneumothorax.

Mild interstitial edema, asymmetric right. Improved right sided
aeration with lower lung airspace disease remaining.
IMPRESSION: No pneumothorax after right-sided thoracentesis.

Mild congestive heart failure with residual small right pleural
effusion and adjacent airspace disease.

## 2022-09-18 DIAGNOSIS — N186 End stage renal disease: Secondary | ICD-10-CM | POA: Diagnosis not present

## 2022-09-18 DIAGNOSIS — N2581 Secondary hyperparathyroidism of renal origin: Secondary | ICD-10-CM | POA: Diagnosis not present

## 2022-09-18 DIAGNOSIS — Z992 Dependence on renal dialysis: Secondary | ICD-10-CM | POA: Diagnosis not present

## 2022-09-18 DIAGNOSIS — E1029 Type 1 diabetes mellitus with other diabetic kidney complication: Secondary | ICD-10-CM | POA: Diagnosis not present

## 2022-09-20 DIAGNOSIS — Z992 Dependence on renal dialysis: Secondary | ICD-10-CM | POA: Diagnosis not present

## 2022-09-20 DIAGNOSIS — N2581 Secondary hyperparathyroidism of renal origin: Secondary | ICD-10-CM | POA: Diagnosis not present

## 2022-09-20 DIAGNOSIS — N186 End stage renal disease: Secondary | ICD-10-CM | POA: Diagnosis not present

## 2022-09-23 ENCOUNTER — Ambulatory Visit: Payer: Medicare HMO | Attending: Physician Assistant | Admitting: Occupational Therapy

## 2022-09-23 DIAGNOSIS — M25642 Stiffness of left hand, not elsewhere classified: Secondary | ICD-10-CM | POA: Diagnosis not present

## 2022-09-23 DIAGNOSIS — R600 Localized edema: Secondary | ICD-10-CM | POA: Diagnosis not present

## 2022-09-23 DIAGNOSIS — M79602 Pain in left arm: Secondary | ICD-10-CM | POA: Insufficient documentation

## 2022-09-23 DIAGNOSIS — M6281 Muscle weakness (generalized): Secondary | ICD-10-CM | POA: Diagnosis not present

## 2022-09-23 DIAGNOSIS — M25622 Stiffness of left elbow, not elsewhere classified: Secondary | ICD-10-CM | POA: Diagnosis not present

## 2022-09-23 DIAGNOSIS — E1022 Type 1 diabetes mellitus with diabetic chronic kidney disease: Secondary | ICD-10-CM | POA: Diagnosis not present

## 2022-09-23 DIAGNOSIS — N186 End stage renal disease: Secondary | ICD-10-CM | POA: Diagnosis not present

## 2022-09-23 DIAGNOSIS — N2581 Secondary hyperparathyroidism of renal origin: Secondary | ICD-10-CM | POA: Diagnosis not present

## 2022-09-23 DIAGNOSIS — R208 Other disturbances of skin sensation: Secondary | ICD-10-CM | POA: Diagnosis not present

## 2022-09-23 DIAGNOSIS — Z992 Dependence on renal dialysis: Secondary | ICD-10-CM | POA: Diagnosis not present

## 2022-09-23 DIAGNOSIS — R278 Other lack of coordination: Secondary | ICD-10-CM | POA: Insufficient documentation

## 2022-09-23 DIAGNOSIS — M25612 Stiffness of left shoulder, not elsewhere classified: Secondary | ICD-10-CM | POA: Insufficient documentation

## 2022-09-23 DIAGNOSIS — M79642 Pain in left hand: Secondary | ICD-10-CM | POA: Diagnosis not present

## 2022-09-23 NOTE — Therapy (Signed)
OUTPATIENT OCCUPATIONAL THERAPY ORTHO EVALUATION  Patient Name: [name] MRN: [id number] DOB:[birth year], 47 y.o., female Today's Date: 09/23/2022  PCP: Dr. Vista Lawman REFERRING PROVIDER: Karoline Caldwell PA-C  END OF SESSION:  OT End of Session - 09/23/22 1408     Visit Number 2    Number of Visits 17    Date for OT Re-Evaluation 11/18/22    Authorization Type humana, MCD    Authorization Time Period 9 weeks    Authorization - Visit Number 2    Progress Note Due on Visit 10    OT Start Time 1403    OT Stop Time 1441    OT Time Calculation (min) 38 min              Past Medical History:  Diagnosis Date   Anemia    Arthritis    Ascites 03/28/2020   Bacteremia 02/11/2020   Cerebral infarction due to unspecified mechanism 05/22/2014   CHF (congestive heart failure) (HCC)    Diabetes mellitus    Diabetic retinopathy ([place])    Hx of laser Rx's   DM type 1 (diabetes mellitus, type 1) ([place]) 09/12/2014   Age of onset for DM type 1 was age 45.      Enlarged thyroid gland    ESRD on hemodialysis ([place])    ESRD due to DM type I, age of onset DM 1 was age 58.  Went on dialysis in March 2012.  Gets HD now at [facility] on a TTS schedule.     ESRD on hemodialysis ([place]) 09/12/2014   ESRD due to DM type 1.  Started HD in 2012 at [facility].  Gets HD there on a TTS schedule.     GERD (gastroesophageal reflux disease)    HCAP (healthcare-associated pneumonia) 09/11/2014   Hemorrhage from arteriovenous dialysis graft ([place]) 10/20/2019   History of blood transfusion    Hyperlipidemia    Hypertension    Hypertensive emergency 05/22/2014   Peripheral vascular disease ([place])    Pneumonia    Past Surgical History:  Procedure Laterality Date   A/V FISTULAGRAM Right 10/31/2020   Procedure: A/V FISTULAGRAM;  Surgeon: Cherre Robins, MD;  Location: [facility];  Service: Cardiovascular;  Laterality: Right;   ARTERIOVENOUS GRAFT PLACEMENT     ARTERY REPAIR Left 12/10/2012    Procedure: BRACHIAL ARTERY REPAIR;  Surgeon: Angelia Mould, MD;  Location: [place];  Service: Vascular;  Laterality: Left;  Exploration Left Brachial Artery for AVF   AV FISTULA PLACEMENT Right 01/11/2018   Procedure: INSERTION OF ARTERIOVENOUS (AV) GORE-TEX GRAFT  UPPER ARM;  Surgeon: Angelia Mould, MD;  Location: [place];  Service: Vascular;  Laterality: Right;   [place] REMOVAL Left 08/12/2022   Procedure: LEFT ARM ARTERIOVENOUS GRAFT EXCISION WITH VEIN PATCH ANGIOPLASTY;  Surgeon: Waynetta Sandy, MD;  Location: [place];  Service: Vascular;  Laterality: Left;   [place] Right 09/28/2017   Procedure: FIRST STAGE BASILIC VEIN TRANSPOSITION;  Surgeon: Angelia Mould, MD;  Location: [place];  Service: Vascular;  Laterality: Right;   CESAREAN SECTION     CHEST TUBE INSERTION Right 06/01/2020   Procedure: CHEST TUBE INSERTION;  Surgeon: Garner Nash, DO;  Location: [place];  Service: Pulmonary;  Laterality: Right;   CHEST TUBE INSERTION Right 07/04/2020   Procedure: INSERTION PLEURAL DRAINAGE CATHETER;  Surgeon: Melrose Nakayama, MD;  Location: [place];  Service: Thoracic;  Laterality: Right;   EYE SURGERY  [place]   EYE SURGERY Left    IR THORACENTESIS ASP PLEURAL SPACE W/IMG GUIDE  05/31/2020   IR THORACENTESIS ASP PLEURAL SPACE W/IMG GUIDE  06/22/2020   REVISION OF ARTERIOVENOUS GORETEX GRAFT Left 08/01/2016   Procedure: REVISION OF ARTERIOVENOUS GORETEX GRAFT;  Surgeon: Angelia Mould, MD;  Location: [place];  Service: Vascular;  Laterality: Left;   REVISION OF ARTERIOVENOUS GORETEX GRAFT Right 10/21/2019   Procedure: REVISION OF ARTERIOVENOUS GORETEX GRAFT ARM;  Surgeon: Rosetta Posner, MD;  Location: [place];  Service: Vascular;  Laterality: Right;   RIGHT HEART CATH N/A 08/01/2020   Procedure: RIGHT HEART CATH;  Surgeon: Jolaine Artist, MD;  Location: [facility];  Service: Cardiovascular;  Laterality: N/A;   SHUNTOGRAM Left  11/30/2013   Procedure: Earney Mallet;  Surgeon: Serafina Mitchell, MD;  Location: [facility] CATH LAB;  Service: Cardiovascular;  Laterality: Left;   TEE WITHOUT CARDIOVERSION N/A 02/13/2020   Procedure: TRANSESOPHAGEAL ECHOCARDIOGRAM (TEE);  Surgeon: Josue Hector, MD;  Location[facility] ENDOSCOPY;  Service: Cardiovascular;  Laterality: N/A;   TEE WITHOUT CARDIOVERSION N/A 08/15/2022   Procedure: TRANSESOPHAGEAL ECHOCARDIOGRAM (TEE);  Surgeon: Janina Mayo, MD;  Location: [facility] ENDOSCOPY;  Service: Cardiovascular;  Laterality: N/A;   Patient Active Problem List   Diagnosis Date Noted   Infection of AV graft for dialysis ([place]) 08/11/2022   Staphylococcus aureus bacteremia 08/08/2022   Pneumonia, staphylococcal ([place]) 08/08/2022   Visit for wound check 07/26/2020   Pleural effusion    Difficulty breathing 05/30/2020   Exudative pleural effusion    Gram-positive bacteremia 02/11/2020   History of CVA (cerebrovascular accident) 03/11/2017   Hyperparathyroidism, secondary renal ([place])    ESRD on hemodialysis ([place]) 09/12/2014   Diabetes mellitus type I ([place]) 09/12/2014   History of diabetic retinopathy 09/12/2014   Cerebral infarction due to unspecified mechanism 05/22/2014   Essential hypertension, benign 05/22/2014   DM (diabetes mellitus) ([place]) 05/22/2014   HLD (hyperlipidemia) 05/22/2014   Cerebral infarction ([place]) 03/29/2014   Facial droop 03/29/2014   Mechanical complication of other vascular device, implant, and graft 01/12/2013   Anxiety disorder, unspecified 05/11/2012    ONSET DATE: 09/10/22-referral date  REFERRING DIAG:    R29.898 (ICD-10-CM) - Left hand weakness    THERAPY DIAG:  Other lack of coordination  Localized edema  Pain in left hand  Pain in left arm  Stiffness of left hand, not elsewhere classified  Stiffness of left elbow, not elsewhere classified  Stiffness of left shoulder, not elsewhere classified  Other disturbances of skin sensation  Muscle weakness  (generalized)  Rationale for Evaluation and Treatment: Rehabilitation  SUBJECTIVE:   SUBJECTIVE STATEMENT: Pt reports her hand feels stiff Pt accompanied by: family member  PERTINENT HISTORY:  s/p excision of left arm axillary AV loop graft, harvest of left basilic vein and vein angioplasty of left axillary artery by Dr. Donzetta Matters on 08/12/22. This was performed secondary to nonfunctioning AV graft that was infected due to MSSA bacteremia. She is on IV abx at dialysis 3x/ week through 09/23/22. Patent is having some numbness, coldness and pain in her left arm and hand. PMH ESRD on HD, HTN, HLD, anxiety, DM II, CVA   PRECAUTIONS: Other: dialysis , AV graft in RUE, on abx for infection  WEIGHT BEARING RESTRICTIONS: No  PAIN:  Are you having pain? Yes: NPRS scale: 5/10 Pain location: fingers to arm Pain description: aching, numb Aggravating factors: cold temps Relieving factors: movement  FALLS: Has patient fallen in last  6 months? No  LIVING ENVIRONMENT: Lives with: lives with their family Lives in: Other town house  PLOF: Independent  PATIENT GOALS:  to be able to use LUE better  NEXT MD VISIT: unknown  OBJECTIVE:   HAND DOMINANCE: Right  ADLs: Overall ADLs: increased time required, difficulty using LUE Transfers/ambulation related to ADLs: Eating: pt is not able to cut normally. Grooming: needs assist with styling hair UB Dressing: mod I with pullover shirt, needs help with buttons LB Dressing:  needs assist with tying  Toileting: mod I Bathing: mod I with sponge bath, increased time Tub Shower transfers: currently sponge bathing due to wound   FUNCTIONAL OUTCOME MEASURES: Quick Dash: TBA  UPPER EXTREMITY ROM:   RUE A/ROM WFLS.  Active ROM Right eval Left eval  Shoulder flexion  120  Shoulder abduction  80  Shoulder adduction    Shoulder extension    Shoulder internal rotation    Shoulder external rotation    Elbow flexion  130  Elbow extension  -25  Wrist  flexion  55  Wrist extension  40  Wrist ulnar deviation    Wrist radial deviation    Wrist pronation  WFL  Wrist supination  90%   Left Composite finger flexion 50%, extension 90%, able to oppose digit 1 with Thumb   HAND FUNCTION: Grip strength: Right: 5 lbs; Left: 45 lbs  COORDINATION: 9 Hole Peg test: Right: 26.30 sec; Left: 8 placed in 2 mins    SENSATION: Light touch: Impaired  Hot/Cold: Impaired   EDEMA: moderate in LUE   COGNITION: Overall cognitive status: Within functional limits for tasks assessed A OBSERVATIONS: Pt demonstrates hypersensitivity in LUE with guarding   TODAY'S TREATMENT:                                                                                                                              DATE: 09/23/22-Pt reports her hand has been tight  Tendon gliding exercises 10 reps min v.c Pt was instructed in updated HEP for Thumb ROM and coordination, min v.c to avoid compensation and demonstration     09/16/22- Pt's blood sugar was low during OT eval( 53 initally). Pt was provided with a protein granola bar and fruit strip along with water. Pt's blood sugar re-bounded to 61. Pt felt much better at end of session.     PATIENT EDUCATION: Education details:09/23/22 additional HEP exercises and coordination tasks, avoiding use of ice and heat due to infection.  Person educated: Patient Education method: Explanation, Demonstration, Verbal cues, and Handouts Education comprehension: verbalized understanding, returned demonstration, and verbal cues required  HOME EXERCISE PROGRAM: 09/16/22- tendon gliding  GOALS: Goals reviewed with patient? Yes potential goals discussed  SHORT TERM GOALS: Target date: 10/14/22  I with initial HEP. Baseline:dependent Goal status: INITIAL  2.  Pt will increase LUE grip strength to 10 lbs or greater for increased functional use. Baseline: Grip strength: Right: 5 lbs; Left: 45 lbs  Goal status: INITIAL  3.  Pt will  demonstrate improved LUE fine motor as evidenced by decreasing 9 hole peg test to completing test in 2 mins or less. Baseline: 8 pegs placed in 2 mins with LUE Goal status: INITIAL  4.  Pt will consistently use LUE as a gross assist at least 25% of the time with pain less than or equal to 4/10. Baseline: uses LUE less than 10%, pain 5/10 Goal status: INITIAL  5.  Pt will demonstrate ability to retrieve a lightweight object at 125 shoulder flexion and -20 elbow extension without drops using LUE. Baseline: LUE shoulder flexion 120, elbow extension -25 Goal status: INITIAL  6.  Pt will perform all basic ADLS mod I. Baseline: Pt requires assist with tying shoes, buttons, styling hair Goal status: INITIAL  LONG TERM GOALS: Target date: 11/18/22  I with updated HEP Baseline: dependent Goal status: INITIAL  2.  Pt will increase LUE grip strength to 25 lbs or greater for increased functional use. Baseline: Grip strength: Right: 5 lbs; Left: 45 lbs Goal status: INITIAL  3.  Pt will demonstrate improved LUE fine motor for ADLS as evidenced by decreasing 9 hole peg test to completing test in 1 min 45 secs. Baseline: 8 pegs placed in 2 mins with LUE Goal status: INITIAL  4.  Pt will consistently use LUE as a gross assist at least 40% of the time with pain less than or equal to 4/10. Baseline: uses LUE less than 10%, pain 5/10 Goal status: INITIAL   5.  Test quick Dash and set goal prn Baseline: NT due to time constraints Goal status: INITIAL  6.  Pt will resume performance of light home management and cooking at a mod I level. Baseline: dependent Goal status: INITIAL  ASSESSMENT:  CLINICAL IMPRESSION: Patient is progressing towards goals. She demonstrates understanding of updates to HEP. Pt was pain free at end of session today. PERFORMANCE DEFICITS: in functional skills including ADLs, IADLs, coordination, dexterity, sensation, edema, ROM, strength, pain, flexibility, Fine motor  control, Gross motor control, decreased knowledge of precautions, decreased knowledge of use of DME, wound, skin integrity, and UE functional use, psychosocial skills including coping strategies, environmental adaptation, habits, interpersonal interactions, and routines and behaviors.   IMPAIRMENTS: are limiting patient from ADLs, IADLs, rest and sleep, play, leisure, and social participation.   COMORBIDITIES: may have co-morbidities  that affects occupational performance. Patient will benefit from skilled OT to address above impairments and improve overall function.  MODIFICATION OR ASSISTANCE TO COMPLETE EVALUATION: Min-Moderate modification of tasks or assist with assess necessary to complete an evaluation.  OT OCCUPATIONAL PROFILE AND HISTORY: Detailed assessment: Review of records and additional review of physical, cognitive, psychosocial history related to current functional performance.  CLINICAL DECISION MAKING: LOW - limited treatment options, no task modification necessary  REHAB POTENTIAL: Good  EVALUATION COMPLEXITY: Low      PLAN:  OT FREQUENCY: 2x/week  OT DURATION: 8 weeks plus eval  PLANNED INTERVENTIONS: self care/ADL training, therapeutic exercise, therapeutic activity, neuromuscular re-education, manual therapy, scar mobilization, manual lymph drainage, passive range of motion, splinting, ultrasound, paraffin, moist heat, cryotherapy, contrast bath, patient/family education, energy conservation, coping strategies training, DME and/or AE instructions, and Re-evaluation  RECOMMENDED OTHER SERVICES: n/a  CONSULTED AND AGREED WITH PLAN OF CARE: Patient  PLAN FOR NEXT SESSION: consider light putty   Lynnie Koehler, OT 09/23/2022, 2:22 PM

## 2022-09-23 NOTE — Patient Instructions (Signed)
Opposition (Active)   Touch tip of thumb to nail tip of each finger in turn, making an "O" shape. Repeat __10__ times. Do _3__ sessions per day.   AROM: Thumb Abduction / Adduction      MP Flexion (Active)   Bend thumb to touch base of little finger, keeping tip joint straight. Repeat __10-15__ times. Do _3___ sessions per day    Composite Extension (Active)    IP Flexion (Active Blocked)   Brace thumb below tip joint. Bend joint as far as possible. Repeat __10__ times. Do _3__ sessions per day.        . Coordination Activities  Perform the following activities for 10-20 minutes 1-2 times per day with left hand(s).  Rotate ball in fingertips (clockwise and counter-clockwise). Flip cards 1 at a time  Deal cards with your thumb (Hold deck in hand and push card off top with thumb). Pick up coins, buttons, marbles, dried beans/pasta of different sizes and place in container. Pick up coins and place in container or coin bank. Pick up coins and stack. Pick up coins one at a time until you get 5-10 in your hand, then move coins from palm to fingertips to place in container 1 at a time

## 2022-09-24 IMAGING — DX DG CHEST 2V
2 series · 2 of 2 positions shown · non-contrast
Comparison: 06/02/2020

CLINICAL DATA: Pleural effusion

EXAM:
CHEST - 2 VIEW

[dg chest 2 view (1 of 2)]
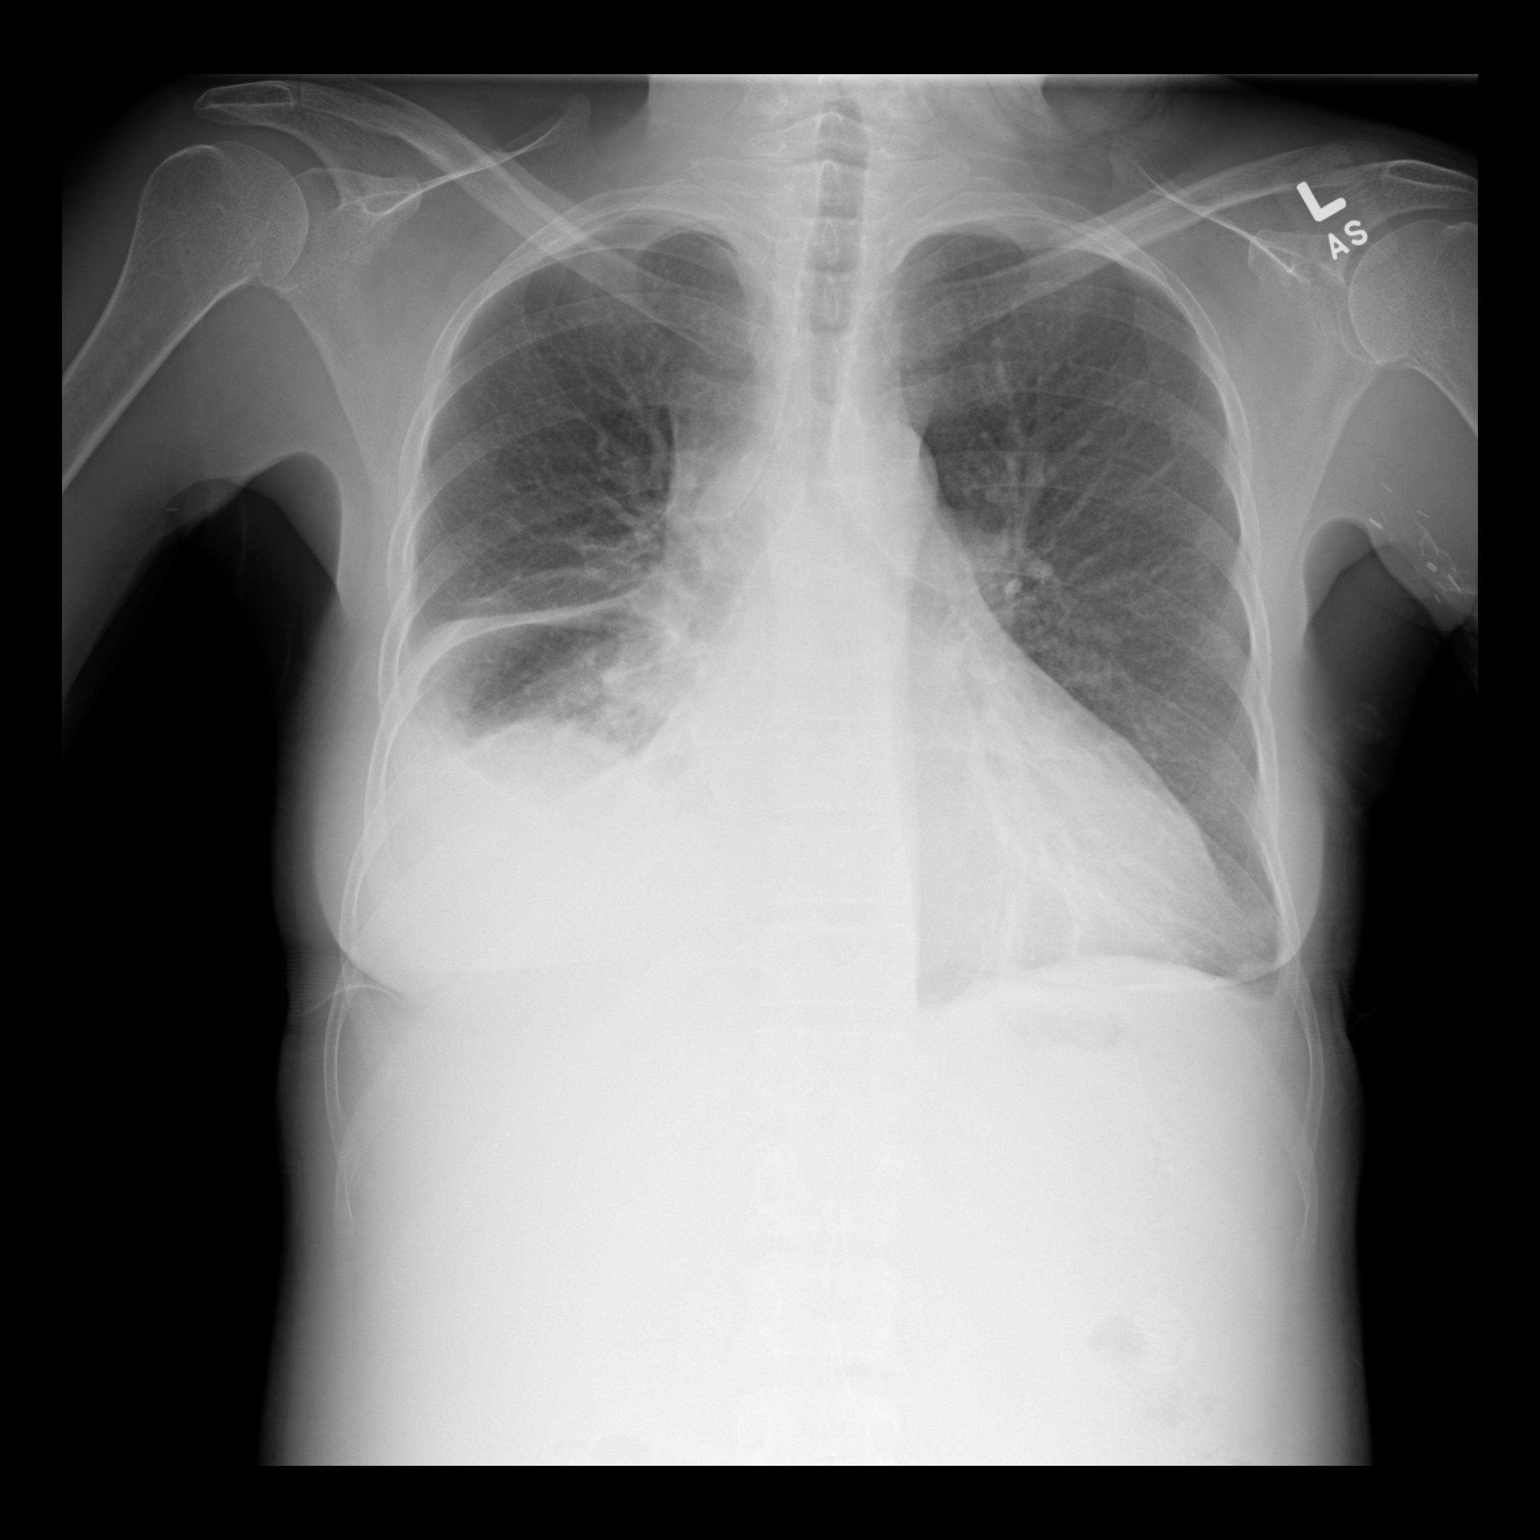

[dg chest 2 view (2 of 2)]
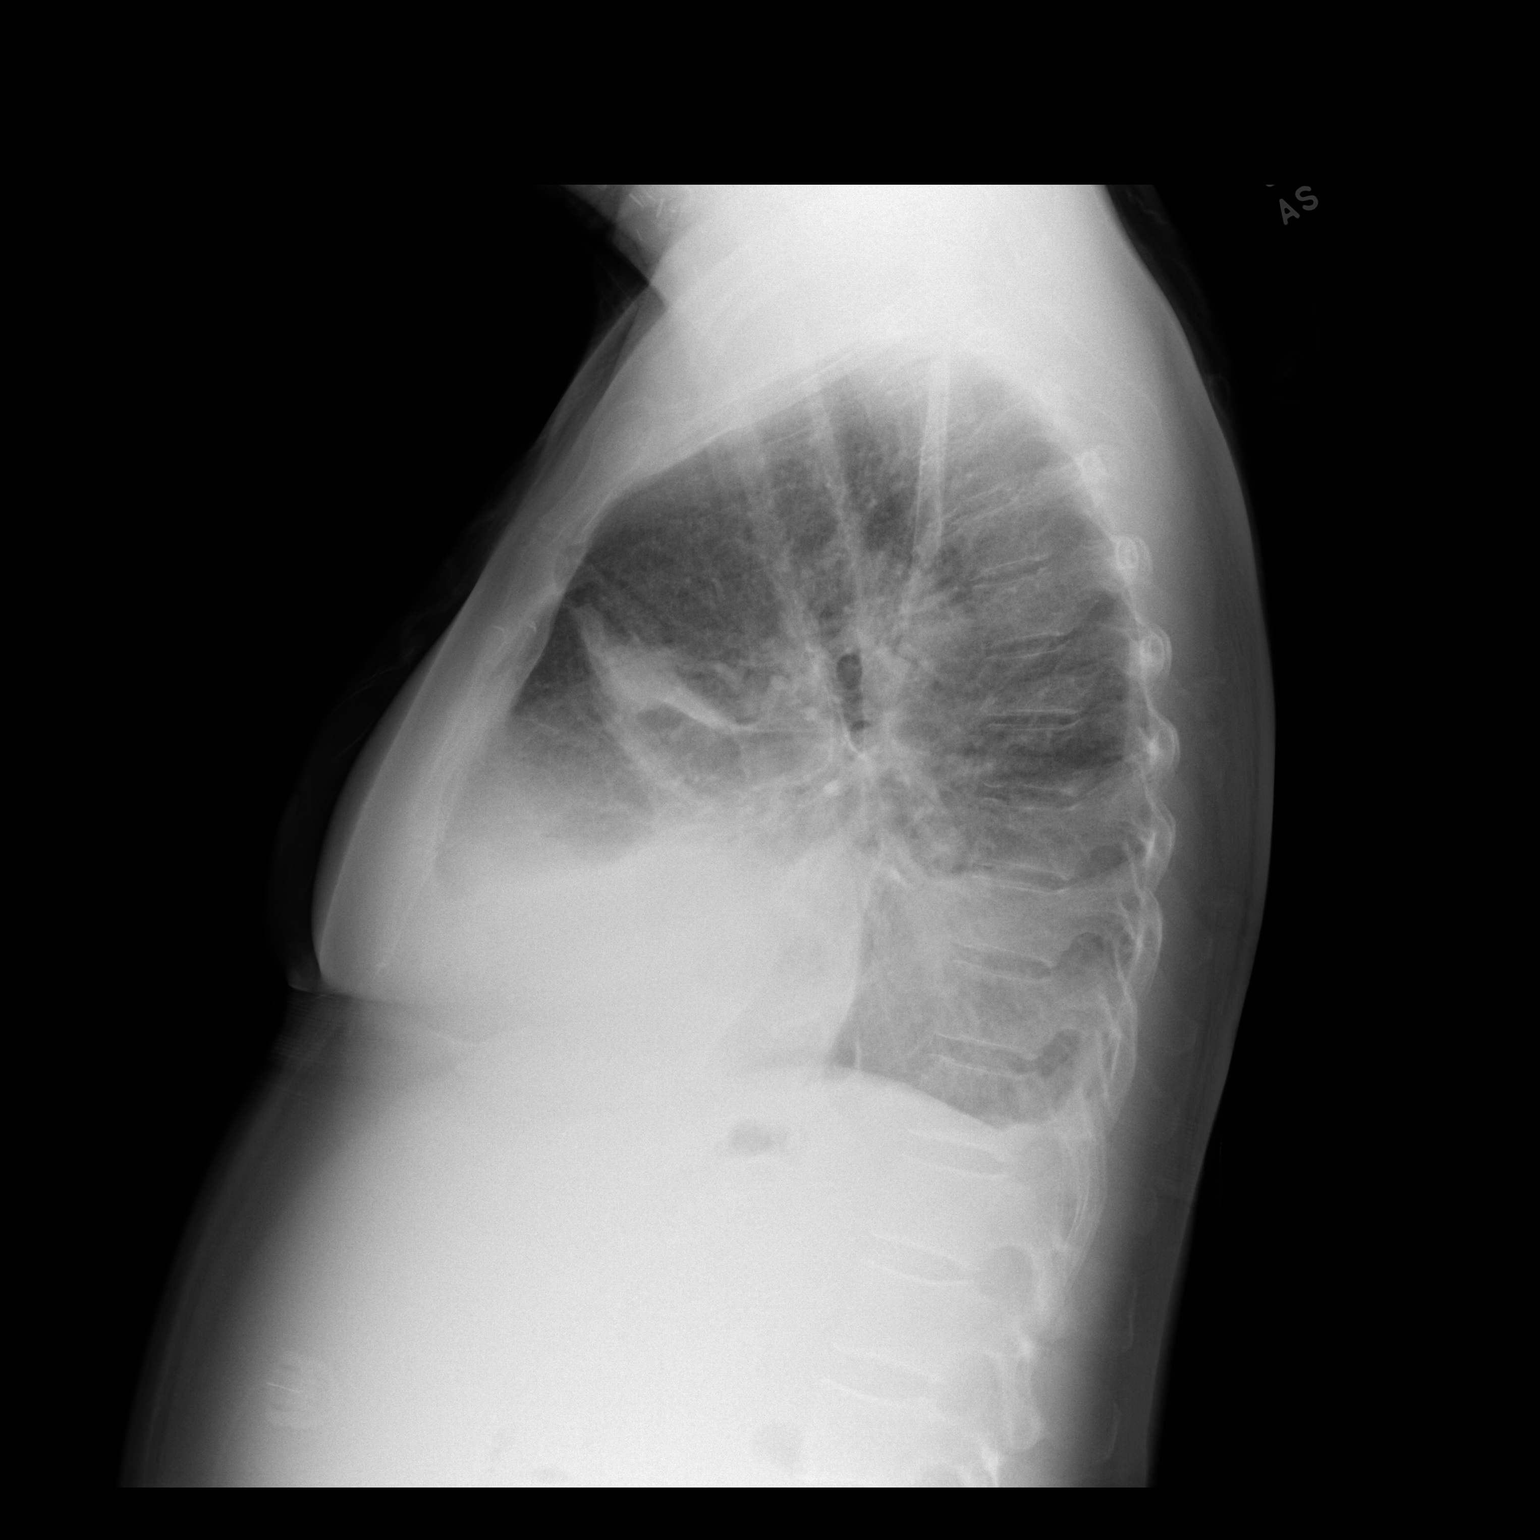

[2 of 2 positions shown; findings below may reference images not displayed]

FINDINGS: Interval removal of right basilar chest tube. Increasing right-sided
pleural effusion, now moderate. Associated hazy right basilar
opacity. No discernible residual right-sided pneumothorax. Left lung
is clear. Stable cardiomegaly.
IMPRESSION: 1. Interval removal of right-sided chest tube with increasing
right-sided pleural effusion, now moderate in size.
2. No residual right apical pneumothorax is seen.

## 2022-09-25 ENCOUNTER — Ambulatory Visit: Payer: Medicare HMO | Admitting: Occupational Therapy

## 2022-09-25 DIAGNOSIS — N2581 Secondary hyperparathyroidism of renal origin: Secondary | ICD-10-CM | POA: Diagnosis not present

## 2022-09-25 DIAGNOSIS — Z992 Dependence on renal dialysis: Secondary | ICD-10-CM | POA: Diagnosis not present

## 2022-09-25 DIAGNOSIS — N186 End stage renal disease: Secondary | ICD-10-CM | POA: Diagnosis not present

## 2022-09-27 DIAGNOSIS — Z992 Dependence on renal dialysis: Secondary | ICD-10-CM | POA: Diagnosis not present

## 2022-09-27 DIAGNOSIS — N2581 Secondary hyperparathyroidism of renal origin: Secondary | ICD-10-CM | POA: Diagnosis not present

## 2022-09-27 DIAGNOSIS — N186 End stage renal disease: Secondary | ICD-10-CM | POA: Diagnosis not present

## 2022-09-30 ENCOUNTER — Ambulatory Visit: Payer: Medicare HMO | Admitting: Occupational Therapy

## 2022-09-30 DIAGNOSIS — M6281 Muscle weakness (generalized): Secondary | ICD-10-CM

## 2022-09-30 DIAGNOSIS — R278 Other lack of coordination: Secondary | ICD-10-CM

## 2022-09-30 DIAGNOSIS — M79602 Pain in left arm: Secondary | ICD-10-CM

## 2022-09-30 DIAGNOSIS — M79642 Pain in left hand: Secondary | ICD-10-CM

## 2022-09-30 DIAGNOSIS — M25622 Stiffness of left elbow, not elsewhere classified: Secondary | ICD-10-CM

## 2022-09-30 DIAGNOSIS — R208 Other disturbances of skin sensation: Secondary | ICD-10-CM | POA: Diagnosis not present

## 2022-09-30 DIAGNOSIS — M25642 Stiffness of left hand, not elsewhere classified: Secondary | ICD-10-CM

## 2022-09-30 DIAGNOSIS — N2581 Secondary hyperparathyroidism of renal origin: Secondary | ICD-10-CM | POA: Diagnosis not present

## 2022-09-30 DIAGNOSIS — N186 End stage renal disease: Secondary | ICD-10-CM | POA: Diagnosis not present

## 2022-09-30 DIAGNOSIS — R600 Localized edema: Secondary | ICD-10-CM

## 2022-09-30 DIAGNOSIS — M25612 Stiffness of left shoulder, not elsewhere classified: Secondary | ICD-10-CM | POA: Diagnosis not present

## 2022-09-30 DIAGNOSIS — Z992 Dependence on renal dialysis: Secondary | ICD-10-CM | POA: Diagnosis not present

## 2022-09-30 NOTE — Therapy (Signed)
OUTPATIENT OCCUPATIONAL THERAPY ORTHO EVALUATION  Patient Name: [name] MRN: [id number] DOB:23-Dec-1975, 47 y.o., female Today's Date: 09/30/2022  PCP: Dr. Vista Lawman REFERRING PROVIDER: Karoline Caldwell PA-C  END OF SESSION:  OT End of Session - 09/30/22 1405     Visit Number 3    Number of Visits 17    Date for OT Re-Evaluation 11/18/22    Authorization Type humana, MCD    Authorization Time Period 9 weeks    Authorization - Visit Number 3    Progress Note Due on Visit 10    OT Start Time 1403    OT Stop Time 1443    OT Time Calculation (min) 40 min               Past Medical History:  Diagnosis Date   Anemia    Arthritis    Ascites 03/28/2020   Bacteremia 02/11/2020   Cerebral infarction due to unspecified mechanism 05/22/2014   CHF (congestive heart failure) (HCC)    Diabetes mellitus    Diabetic retinopathy ([place])    Hx of laser Rx's   DM type 1 (diabetes mellitus, type 1) ([place]) 09/12/2014   Age of onset for DM type 1 was age 34.      Enlarged thyroid gland    ESRD on hemodialysis ([place])    ESRD due to DM type I, age of onset DM 1 was age 22.  Went on dialysis in March 2012.  Gets HD now at [facility] on a TTS schedule.     ESRD on hemodialysis ([place]) 09/12/2014   ESRD due to DM type 1.  Started HD in 2012 at [facility].  Gets HD there on a TTS schedule.     GERD (gastroesophageal reflux disease)    HCAP (healthcare-associated pneumonia) 09/11/2014   Hemorrhage from arteriovenous dialysis graft ([place]) 10/20/2019   History of blood transfusion    Hyperlipidemia    Hypertension    Hypertensive emergency 05/22/2014   Peripheral vascular disease ([place])    Pneumonia    Past Surgical History:  Procedure Laterality Date   A/V FISTULAGRAM Right 10/31/2020   Procedure: A/V FISTULAGRAM;  Surgeon: Cherre Robins, MD;  Location: [facility];  Service: Cardiovascular;  Laterality: Right;   ARTERIOVENOUS GRAFT PLACEMENT     ARTERY REPAIR Left  12/10/2012   Procedure: BRACHIAL ARTERY REPAIR;  Surgeon: Angelia Mould, MD;  Location: [place];  Service: Vascular;  Laterality: Left;  Exploration Left Brachial Artery for AVF   AV FISTULA PLACEMENT Right 01/11/2018   Procedure: INSERTION OF ARTERIOVENOUS (AV) GORE-TEX GRAFT  UPPER ARM;  Surgeon: Angelia Mould, MD;  Location: [facility];  Service: Vascular;  Laterality: Right;   [place] REMOVAL Left 08/12/2022   Procedure: LEFT ARM ARTERIOVENOUS GRAFT EXCISION WITH VEIN PATCH ANGIOPLASTY;  Surgeon: Waynetta Sandy, MD;  Location: [place];  Service: Vascular;  Laterality: Left;   [place] Right 09/28/2017   Procedure: FIRST STAGE BASILIC VEIN TRANSPOSITION;  Surgeon: Angelia Mould, MD;  Location: [place];  Service: Vascular;  Laterality: Right;   CESAREAN SECTION     CHEST TUBE INSERTION Right 06/01/2020   Procedure: CHEST TUBE INSERTION;  Surgeon: Garner Nash, DO;  Location: [place];  Service: Pulmonary;  Laterality: Right;   CHEST TUBE INSERTION Right 07/04/2020   Procedure: INSERTION PLEURAL DRAINAGE CATHETER;  Surgeon: Melrose Nakayama, MD;  Location: [place];  Service: Thoracic;  Laterality: Right;   EYE SURGERY  [place]   EYE SURGERY Left    IR THORACENTESIS ASP PLEURAL SPACE W/IMG GUIDE  05/31/2020   IR THORACENTESIS ASP PLEURAL SPACE W/IMG GUIDE  06/22/2020   REVISION OF ARTERIOVENOUS GORETEX GRAFT Left 08/01/2016   Procedure: REVISION OF ARTERIOVENOUS GORETEX GRAFT;  Surgeon: Angelia Mould, MD;  Location: [facility];  Service: Vascular;  Laterality: Left;   REVISION OF ARTERIOVENOUS GORETEX GRAFT Right 10/21/2019   Procedure: REVISION OF ARTERIOVENOUS GORETEX GRAFT ARM;  Surgeon: Rosetta Posner, MD;  Location: [place];  Service: Vascular;  Laterality: Right;   RIGHT HEART CATH N/A 08/01/2020   Procedure: RIGHT HEART CATH;  Surgeon: Jolaine Artist, MD;  Location: [facility];  Service: Cardiovascular;  Laterality: N/A;    SHUNTOGRAM Left 11/30/2013   Procedure: Earney Mallet;  Surgeon: Serafina Mitchell, MD;  Location: [facility] C[facility] LAB;  Service: Cardiovascular;  Laterality: Left;   TEE WITHOUT CARDIOVERSION N/A 02/13/2020   Procedure: TRANSESOPHAGEAL ECHOCARDIOGRAM (TEE);  Surgeon: Josue Hector, MD;  Location: [facility] ENDOSCOPY;  Service: Cardiovascular;  Laterality: N/A;   TEE WITHOUT CARDIOVERSION N/A 08/15/2022   Procedure: TRANSESOPHAGEAL ECHOCARDIOGRAM (TEE);  Surgeon: Janina Mayo, MD;  Location: [facility] ENDOSCOPY;  Service: Cardiovascular;  Laterality: N/A;   Patient Active Problem List   Diagnosis Date Noted   Infection of AV graft for dialysis ([place]) 08/11/2022   Staphylococcus aureus bacteremia 08/08/2022   Pneumonia, staphylococcal ([place]) 08/08/2022   Visit for wound check 07/26/2020   Pleural effusion    Difficulty breathing 05/30/2020   Exudative pleural effusion    Gram-positive bacteremia 02/11/2020   History of CVA (cerebrovascular accident) 03/11/2017   Hyperparathyroidism, secondary renal ([place])    ESRD on hemodialysis ([place]) 09/12/2014   Diabetes mellitus type I ([place]) 09/12/2014   History of diabetic retinopathy 09/12/2014   Cerebral infarction due to unspecified mechanism 05/22/2014   Essential hypertension, benign 05/22/2014   DM (diabetes mellitus) ([place]) 05/22/2014   HLD (hyperlipidemia) 05/22/2014   Cerebral infarction ([place]) 03/29/2014   Facial droop 03/29/2014   Mechanical complication of other vascular device, implant, and graft 01/12/2013   Anxiety disorder, unspecified 05/11/2012    ONSET DATE: 09/10/22-referral date  REFERRING DIAG:    R29.898 (ICD-10-CM) - Left hand weakness    THERAPY DIAG:  No diagnosis found.  Rationale for Evaluation and Treatment: Rehabilitation  SUBJECTIVE:   SUBJECTIVE STATEMENT: Pt reports no pain with the exercises Pt accompanied by: family member  PERTINENT HISTORY:  s/p excision of left arm axillary AV loop graft, harvest of left basilic vein and  vein angioplasty of left axillary artery by Dr. Donzetta Matters on 08/12/22. This was performed secondary to nonfunctioning AV graft that was infected due to MSSA bacteremia. She is on IV abx at dialysis 3x/ week through 09/23/22. Patent is having some numbness, coldness and pain in her left arm and hand. PMH ESRD on HD, HTN, HLD, anxiety, DM II, CVA   PRECAUTIONS: Other: dialysis , AV graft in RUE, on abx for infection  WEIGHT BEARING RESTRICTIONS: No  PAIN:  Are you having pain? Yes: NPRS scale: 5/10 Pain location: fingers to arm Pain description: aching, numb Aggravating factors: cold temps Relieving factors: movement  FALLS: Has patient fallen in last 6 months? No  LIVING ENVIRONMENT: Lives with: lives with their family Lives in: Other town house  PLOF: Independent  PATIENT GOALS:  to be able to use LUE better  NEXT MD VISIT: unknown  OBJECTIVE:   HAND DOMINANCE: Right  ADLs: Overall ADLs:  increased time required, difficulty using LUE Transfers/ambulation related to ADLs: Eating: pt is not able to cut normally. Grooming: needs assist with styling hair UB Dressing: mod I with pullover shirt, needs help with buttons LB Dressing:  needs assist with tying  Toileting: mod I Bathing: mod I with sponge bath, increased time Tub Shower transfers: currently sponge bathing due to wound   FUNCTIONAL OUTCOME MEASURES: Quick Dash: 61.3%- 09/30/22   UPPER EXTREMITY ROM:   RUE A/ROM WFLS.  Active ROM Right eval Left eval  Shoulder flexion  120  Shoulder abduction  80  Shoulder adduction    Shoulder extension    Shoulder internal rotation    Shoulder external rotation    Elbow flexion  130  Elbow extension  -25  Wrist flexion  55  Wrist extension  40  Wrist ulnar deviation    Wrist radial deviation    Wrist pronation  WFL  Wrist supination  90%   Left Composite finger flexion 50%, extension 90%, able to oppose digit 1 with Thumb   HAND FUNCTION: Grip strength: Right: 5 lbs;  Left: 45 lbs  COORDINATION: 9 Hole Peg test: Right: 26.30 sec; Left: 8 placed in 2 mins    SENSATION: Light touch: Impaired  Hot/Cold: Impaired   EDEMA: moderate in LUE   COGNITION: Overall cognitive status: Within functional limits for tasks assessed A OBSERVATIONS: Pt demonstrates hypersensitivity in LUE with guarding   TODAY'S TREATMENT:                                                                                                                              DATE: 09/30/22-Tendon gliding exercises x 10 reps  Thumb MP and IP flexion min v.c  Pt completed Quick Dash :61.3 % disability Pt was instructed in putty HEP see pt instructions, min v.c Flipping, dealing and flicking playing cards with LUE, min difficulty v.c for techniques Stacking coins and manipulating in hand to place in container,min- mod difficulty, v.c to avoid compensation, shelf liner used for increased ease.   09/23/22-Pt reports her hand has been tight  Tendon gliding exercises 10 reps min v.c Pt was instructed in updated HEP for Thumb ROM and coordination, min v.c to avoid compensation and demonstration     09/16/22- Pt's blood sugar was low during OT eval( 53 initally). Pt was provided with a protein granola bar and fruit strip along with water. Pt's blood sugar re-bounded to 61. Pt felt much better at end of session.     PATIENT EDUCATION: Education details:yellow putty exercises and rubberband for finger extension.  Person educated: Patient Education method: Explanation, Demonstration, Verbal cues, and Handouts Education comprehension: verbalized understanding, returned demonstration, and verbal cues required  HOME EXERCISE PROGRAM: 09/16/22- tendon gliding  GOALS: Goals reviewed with patient? Yes potential goals discussed  SHORT TERM GOALS: Target date: 10/14/22  I with initial HEP. Baseline:dependent Goal status: INITIAL  2.  Pt will increase LUE grip strength to  10 lbs or greater for  increased functional use. Baseline: Grip strength: Right: 5 lbs; Left: 45 lbs Goal status: INITIAL  3.  Pt will demonstrate improved LUE fine motor as evidenced by decreasing 9 hole peg test to completing test in 2 mins or less. Baseline: 8 pegs placed in 2 mins with LUE Goal status: INITIAL  4.  Pt will consistently use LUE as a gross assist at least 25% of the time with pain less than or equal to 4/10. Baseline: uses LUE less than 10%, pain 5/10 Goal status: INITIAL  5.  Pt will demonstrate ability to retrieve a lightweight object at 125 shoulder flexion and -20 elbow extension without drops using LUE. Baseline: LUE shoulder flexion 120, elbow extension -25 Goal status: INITIAL  6.  Pt will perform all basic ADLS mod I. Baseline: Pt requires assist with tying shoes, buttons, styling hair Goal status: INITIAL  LONG TERM GOALS: Target date: 11/18/22  I with updated HEP Baseline: dependent Goal status: INITIAL  2.  Pt will increase LUE grip strength to 25 lbs or greater for increased functional use. Baseline: Grip strength: Right: 5 lbs; Left: 45 lbs Goal status: INITIAL  3.  Pt will demonstrate improved LUE fine motor for ADLS as evidenced by decreasing 9 hole peg test to completing test in 1 min 45 secs. Baseline: 8 pegs placed in 2 mins with LUE Goal status: INITIAL  4.  Pt will consistently use LUE as a gross assist at least 40% of the time with pain less than or equal to 4/10. Baseline: uses LUE less than 10%, pain 5/10 Goal status: INITIAL   5.  Pt. will improve Quick Dash score to 55% disability or less. Baseline: 61.3% disability 09/30/22 Goal status: new  6.  Pt will resume performance of light home management and cooking at a mod I level. Baseline: dependent Goal status: INITIAL  ASSESSMENT:  CLINICAL IMPRESSION: Patient is progressing towards goals. She demonstrates significant gains in ROM and LUE functional use. PERFORMANCE DEFICITS: in functional skills  including ADLs, IADLs, coordination, dexterity, sensation, edema, ROM, strength, pain, flexibility, Fine motor control, Gross motor control, decreased knowledge of precautions, decreased knowledge of use of DME, wound, skin integrity, and UE functional use, psychosocial skills including coping strategies, environmental adaptation, habits, interpersonal interactions, and routines and behaviors.   IMPAIRMENTS: are limiting patient from ADLs, IADLs, rest and sleep, play, leisure, and social participation.   COMORBIDITIES: may have co-morbidities  that affects occupational performance. Patient will benefit from skilled OT to address above impairments and improve overall function.  MODIFICATION OR ASSISTANCE TO COMPLETE EVALUATION: Min-Moderate modification of tasks or assist with assess necessary to complete an evaluation.  OT OCCUPATIONAL PROFILE AND HISTORY: Detailed assessment: Review of records and additional review of physical, cognitive, psychosocial history related to current functional performance.  CLINICAL DECISION MAKING: LOW - limited treatment options, no task modification necessary  REHAB POTENTIAL: Good  EVALUATION COMPLEXITY: Low      PLAN:  OT FREQUENCY: 2x/week  OT DURATION: 8 weeks plus eval  PLANNED INTERVENTIONS: self care/ADL training, therapeutic exercise, therapeutic activity, neuromuscular re-education, manual therapy, scar mobilization, manual lymph drainage, passive range of motion, splinting, ultrasound, paraffin, moist heat, cryotherapy, contrast bath, patient/family education, energy conservation, coping strategies training, DME and/or AE instructions, and Re-evaluation  RECOMMENDED OTHER SERVICES: n/a  CONSULTED AND AGREED WITH PLAN OF CARE: Patient  PLAN FOR NEXT SESSION: functional use of LUE   Tru Leopard, OT 09/30/2022, 2:07 PM

## 2022-09-30 NOTE — Patient Instructions (Signed)
Putty-in-Your-Hand    Slowly squeeze putty or a soft rubber ball while breathing normally. Repeat with other hand. Repeat __10-20__ times. Do __1__ sessions per day.  http://gt2.exer.us/885   Copyright  VHI. All rights reserved.   Pinch: Palmar      Pinch putty with right thumb and each fingertip in turn. Repeat _10-20___ times. Do _1___ sessions per day.    Copyright  VHI. All rights reserved.  Pinch: Lateral    Squeeze putty between right thumb and side of each finger in turn. Make small balls and squash into coin shapes. Repeat __10-20__ times. Do _1___ sessions per day. Activity: Hold dish of food.* Turn key in lock. Deal cards.  Copyright  VHI. All rights reserved.     Copyright  VHI. All rights reserved.   Finger Extension / Thumb Abduction: Resisted    With rubber band around right thumb and ___all left hand_____ fingers, hand slightly cupped, gently spread thumb and fingers apart. Repeat ___10-20_ times per set. Do _1___ sets per session. Do __1__ sessions per day.

## 2022-10-01 DIAGNOSIS — Z992 Dependence on renal dialysis: Secondary | ICD-10-CM | POA: Diagnosis not present

## 2022-10-01 DIAGNOSIS — E103593 Type 1 diabetes mellitus with proliferative diabetic retinopathy without macular edema, bilateral: Secondary | ICD-10-CM | POA: Diagnosis not present

## 2022-10-01 DIAGNOSIS — Z9641 Presence of insulin pump (external) (internal): Secondary | ICD-10-CM | POA: Diagnosis not present

## 2022-10-01 DIAGNOSIS — E1022 Type 1 diabetes mellitus with diabetic chronic kidney disease: Secondary | ICD-10-CM | POA: Diagnosis not present

## 2022-10-01 DIAGNOSIS — N186 End stage renal disease: Secondary | ICD-10-CM | POA: Diagnosis not present

## 2022-10-02 ENCOUNTER — Ambulatory Visit: Payer: Medicare HMO | Admitting: Occupational Therapy

## 2022-10-02 DIAGNOSIS — N2581 Secondary hyperparathyroidism of renal origin: Secondary | ICD-10-CM | POA: Diagnosis not present

## 2022-10-02 DIAGNOSIS — N186 End stage renal disease: Secondary | ICD-10-CM | POA: Diagnosis not present

## 2022-10-02 DIAGNOSIS — Z992 Dependence on renal dialysis: Secondary | ICD-10-CM | POA: Diagnosis not present

## 2022-10-04 DIAGNOSIS — N186 End stage renal disease: Secondary | ICD-10-CM | POA: Diagnosis not present

## 2022-10-04 DIAGNOSIS — Z992 Dependence on renal dialysis: Secondary | ICD-10-CM | POA: Diagnosis not present

## 2022-10-04 DIAGNOSIS — N2581 Secondary hyperparathyroidism of renal origin: Secondary | ICD-10-CM | POA: Diagnosis not present

## 2022-10-07 ENCOUNTER — Ambulatory Visit: Payer: Medicare HMO | Admitting: Occupational Therapy

## 2022-10-07 ENCOUNTER — Encounter: Payer: Self-pay | Admitting: Occupational Therapy

## 2022-10-07 DIAGNOSIS — Z992 Dependence on renal dialysis: Secondary | ICD-10-CM | POA: Diagnosis not present

## 2022-10-07 DIAGNOSIS — M79602 Pain in left arm: Secondary | ICD-10-CM | POA: Diagnosis not present

## 2022-10-07 DIAGNOSIS — N186 End stage renal disease: Secondary | ICD-10-CM | POA: Diagnosis not present

## 2022-10-07 DIAGNOSIS — R208 Other disturbances of skin sensation: Secondary | ICD-10-CM

## 2022-10-07 DIAGNOSIS — M25642 Stiffness of left hand, not elsewhere classified: Secondary | ICD-10-CM | POA: Diagnosis not present

## 2022-10-07 DIAGNOSIS — M6281 Muscle weakness (generalized): Secondary | ICD-10-CM | POA: Diagnosis not present

## 2022-10-07 DIAGNOSIS — M25612 Stiffness of left shoulder, not elsewhere classified: Secondary | ICD-10-CM | POA: Diagnosis not present

## 2022-10-07 DIAGNOSIS — R278 Other lack of coordination: Secondary | ICD-10-CM | POA: Diagnosis not present

## 2022-10-07 DIAGNOSIS — M79642 Pain in left hand: Secondary | ICD-10-CM

## 2022-10-07 DIAGNOSIS — R600 Localized edema: Secondary | ICD-10-CM | POA: Diagnosis not present

## 2022-10-07 DIAGNOSIS — N2581 Secondary hyperparathyroidism of renal origin: Secondary | ICD-10-CM | POA: Diagnosis not present

## 2022-10-07 DIAGNOSIS — M25622 Stiffness of left elbow, not elsewhere classified: Secondary | ICD-10-CM | POA: Diagnosis not present

## 2022-10-07 NOTE — Therapy (Signed)
OUTPATIENT OCCUPATIONAL THERAPY ORTHO EVALUATION  Patient Name: [name] MRN: [id number] DOB:05-13-1976, 47 y.o., female Today's Date: 10/07/2022  PCP: Dr. Vista Lawman REFERRING PROVIDER: Karoline Caldwell PA-C  END OF SESSION:  OT End of Session - 10/07/22 1400     Visit Number 4    Number of Visits 17    Authorization Type humana, MCD    Authorization Time Period 9 weeks    Authorization - Visit Number 4    Progress Note Due on Visit 10    OT Start Time 1402    OT Stop Time 1445    OT Time Calculation (min) 43 min               Past Medical History:  Diagnosis Date   Anemia    Arthritis    Ascites 03/28/2020   Bacteremia 02/11/2020   Cerebral infarction due to unspecified mechanism 05/22/2014   CHF (congestive heart failure) (HCC)    Diabetes mellitus    Diabetic retinopathy ([place])    Hx of laser Rx's   DM type 1 (diabetes mellitus, type 1) ([place]) 09/12/2014   Age of onset for DM type 1 was age 7.      Enlarged thyroid gland    ESRD on hemodialysis ([place])    ESRD due to DM type I, age of onset DM 1 was age 15.  Went on dialysis in March 2012.  Gets HD now at [facility] on a TTS schedule.     ESRD on hemodialysis ([place]) 09/12/2014   ESRD due to DM type 1.  Started HD in 2012 at [facility].  Gets HD there on a TTS schedule.     GERD (gastroesophageal reflux disease)    HCAP (healthcare-associated pneumonia) 09/11/2014   Hemorrhage from arteriovenous dialysis graft ([place]) 10/20/2019   History of blood transfusion    Hyperlipidemia    Hypertension    Hypertensive emergency 05/22/2014   Peripheral vascular disease ([place])    Pneumonia    Past Surgical History:  Procedure Laterality Date   A/V FISTULAGRAM Right 10/31/2020   Procedure: A/V FISTULAGRAM;  Surgeon: Cherre Robins, MD;  Location: [facility];  Service: Cardiovascular;  Laterality: Right;   ARTERIOVENOUS GRAFT PLACEMENT     ARTERY REPAIR Left 12/10/2012   Procedure: BRACHIAL ARTERY  REPAIR;  Surgeon: Angelia Mould, MD;  Location: [place] [facility];  Service: Vascular;  Laterality: Left;  Exploration Left Brachial Artery for AVF   AV FISTULA PLACEMENT Right 01/11/2018   Procedure: INSERTION OF ARTERIOVENOUS (AV) GORE-TEX GRAFT  UPPER ARM;  Surgeon: Angelia Mould, MD;  Location: [facility];  Service: Vascular;  Laterality: Right;   [place] REMOVAL Left 08/12/2022   Procedure: LEFT ARM ARTERIOVENOUS GRAFT EXCISION WITH VEIN PATCH ANGIOPLASTY;  Surgeon: Waynetta Sandy, MD;  Location: [facility];  Service: Vascular;  Laterality: Left;   [place] Right 09/28/2017   Procedure: FIRST STAGE BASILIC VEIN TRANSPOSITION;  Surgeon: Angelia Mould, MD;  Location: [facility];  Service: Vascular;  Laterality: Right;   CESAREAN SECTION     CHEST TUBE INSERTION Right 06/01/2020   Procedure: CHEST TUBE INSERTION;  Surgeon: Garner Nash, DO;  Location: [facility];  Service: Pulmonary;  Laterality: Right;   CHEST TUBE INSERTION Right 07/04/2020   Procedure: INSERTION PLEURAL DRAINAGE CATHETER;  Surgeon: Melrose Nakayama, MD;  Location: [facility];  Service: Thoracic;  Laterality: Right;   [place]  Left    IR THORACENTESIS ASP PLEURAL SPACE W/IMG GUIDE  05/31/2020   IR THORACENTESIS ASP PLEURAL SPACE W/IMG GUIDE  06/22/2020   REVISION OF ARTERIOVENOUS GORETEX GRAFT Left 08/01/2016   Procedure: REVISION OF ARTERIOVENOUS GORETEX GRAFT;  Surgeon: Angelia Mould, MD;  Location: [facility];  Service: Vascular;  Laterality: Left;   REVISION OF ARTERIOVENOUS GORETEX GRAFT Right 10/21/2019   Procedure: REVISION OF ARTERIOVENOUS GORETEX GRAFT ARM;  Surgeon: Rosetta Posner, MD;  Location: [facility];  Service: Vascular;  Laterality: Right;   RIGHT HEART CATH N/A 08/01/2020   Procedure: RIGHT HEART CATH;  Surgeon: Jolaine Artist, MD;  Location: [facility];  Service: Cardiovascular;  Laterality: N/A;   SHUNTOGRAM Left 11/30/2013   Procedure:  Earney Mallet;  Surgeon: Serafina Mitchell, MD;  Location: [facility] LAB;  Service: Cardiovascular;  Laterality: Left;   TEE WITHOUT CARDIOVERSION N/A 02/13/2020   Procedure: TRANSESOPHAGEAL ECHOCARDIOGRAM (TEE);  Surgeon: Josue Hector, MD;  Location: [facility] ENDOSCOPY;  Service: Cardiovascular;  Laterality: N/A;   TEE WITHOUT CARDIOVERSION N/A 08/15/2022   Procedure: TRANSESOPHAGEAL ECHOCARDIOGRAM (TEE);  Surgeon: Janina Mayo, MD;  Location: [facility] ENDOSCOPY;  Service: Cardiovascular;  Laterality: N/A;   Patient Active Problem List   Diagnosis Date Noted   Infection of AV graft for dialysis ([place]) 08/11/2022   Staphylococcus aureus bacteremia 08/08/2022   Pneumonia, staphylococcal ([place]) 08/08/2022   Visit for wound check 07/26/2020   Pleural effusion    Difficulty breathing 05/30/2020   Exudative pleural effusion    Gram-positive bacteremia 02/11/2020   History of CVA (cerebrovascular accident) 03/11/2017   Hyperparathyroidism, secondary renal ([place])    ESRD on hemodialysis ([place]) 09/12/2014   Diabetes mellitus type I ([place]) 09/12/2014   History of diabetic retinopathy 09/12/2014   Cerebral infarction due to unspecified mechanism 05/22/2014   Essential hypertension, benign 05/22/2014   DM (diabetes mellitus) ([place]) 05/22/2014   HLD (hyperlipidemia) 05/22/2014   Cerebral infarction ([place]) 03/29/2014   Facial droop 03/29/2014   Mechanical complication of other vascular device, implant, and graft 01/12/2013   Anxiety disorder, unspecified 05/11/2012    ONSET DATE: 09/10/22-referral date  REFERRING DIAG:    R29.898 (ICD-10-CM) - Left hand weakness    THERAPY DIAG:  Pain in left hand  Pain in left arm  Stiffness of left hand, not elsewhere classified  Stiffness of left shoulder, not elsewhere classified  Stiffness of left elbow, not elsewhere classified  Other disturbances of skin sensation  Muscle weakness (generalized)  Localized edema  Rationale for Evaluation and Treatment:  Rehabilitation  SUBJECTIVE:   SUBJECTIVE STATEMENT: Pt reports  pain with cold temps Pt accompanied by: family member  PERTINENT HISTORY:  s/p excision of left arm axillary AV loop graft, harvest of left basilic vein and vein angioplasty of left axillary artery by Dr. Donzetta Matters on 08/12/22. This was performed secondary to nonfunctioning AV graft that was infected due to MSSA bacteremia. She is on IV abx at dialysis 3x/ week through 09/23/22. Patent is having some numbness, coldness and pain in her left arm and hand. PMH ESRD on HD, HTN, HLD, anxiety, DM II, CVA   PRECAUTIONS: Other: dialysis , AV graft in RUE, on abx for infection  WEIGHT BEARING RESTRICTIONS: No  PAIN:  Are you having pain? Yes: NPRS scale: 2/10 Pain location: fingers to arm Pain description: aching, numb Aggravating factors: cold temps Relieving factors: movement  FALLS: Has patient fallen in last 6 months? No  LIVING ENVIRONMENT: Lives with: lives  with their family Lives in: Other town house  PLOF: Independent  PATIENT GOALS:  to be able to use LUE better  NEXT MD VISIT: unknown  OBJECTIVE:   HAND DOMINANCE: Right  ADLs: Overall ADLs: increased time required, difficulty using LUE Transfers/ambulation related to ADLs: Eating: pt is not able to cut normally. Grooming: needs assist with styling hair UB Dressing: mod I with pullover shirt, needs help with buttons LB Dressing:  needs assist with tying  Toileting: mod I Bathing: mod I with sponge bath, increased time Tub Shower transfers: currently sponge bathing due to wound   FUNCTIONAL OUTCOME MEASURES: Quick Dash: 61.3%- 09/30/22   UPPER EXTREMITY ROM:   RUE A/ROM WFLS.  Active ROM Right eval Left eval  Shoulder flexion  120  Shoulder abduction  80  Shoulder adduction    Shoulder extension    Shoulder internal rotation    Shoulder external rotation    Elbow flexion  130  Elbow extension  -25  Wrist flexion  55  Wrist extension  40  Wrist  ulnar deviation    Wrist radial deviation    Wrist pronation  WFL  Wrist supination  90%   Left Composite finger flexion 50%, extension 90%, able to oppose digit 1 with Thumb   HAND FUNCTION: Grip strength: Right: 5 lbs; Left: 45 lbs  COORDINATION: 9 Hole Peg test: Right: 26.30 sec; Left: 8 placed in 2 mins    SENSATION: Light touch: Impaired  Hot/Cold: Impaired   EDEMA: moderate in LUE   COGNITION: Overall cognitive status: Within functional limits for tasks assessed A OBSERVATIONS: Pt demonstrates hypersensitivity in LUE with guarding   TODAY'S TREATMENT:                                                                                                                              DATE: 10/07/22 BP 157/95 Tendon gliding x 10 reps  Bag crumpling exercise x 3 reps Pen rolling exercise x 10 reps mod v.c and min difficulty Rotating ball each direction x 5 reps Flipping and dealing playing cards with LUE, min v.c  Gripper set at level 1 to pick up approximately 15 blocks with mod difficulty, for sustained grip. Grade clothespins, placing and removing yellow and red as well a 1 green from target Placing and removing wooden dowel pegs from pegboard with LUE, min v.c for 3 pt pinch and avoiding compensation Closed chain shoulder flexion and abduction 10 reps , min v.c  Red flex bar for sustained grip and wrist strength x 10 reps each direction.      09/30/22-Tendon gliding exercises x 10 reps  Thumb MP and IP flexion min v.c  Pt completed Quick Dash :61.3 % disability Pt was instructed in putty HEP see pt instructions, min v.c Flipping, dealing and flicking playing cards with LUE, min difficulty v.c for techniques Stacking coins and manipulating in hand to place in container,min- mod difficulty, v.c to avoid compensation, shelf  liner used for increased ease.   09/23/22-Pt reports her hand has been tight  Tendon gliding exercises 10 reps min v.c Pt was instructed in updated HEP  for Thumb ROM and coordination, min v.c to avoid compensation and demonstration     09/16/22- Pt's blood sugar was low during OT eval( 53 initally). Pt was provided with a protein granola bar and fruit strip along with water. Pt's blood sugar re-bounded to 61. Pt felt much better at end of session.     PATIENT EDUCATION: Education details: 10/07/22- see above  Person educated: Patient Education method: Explanation, Demonstration, Verbal cues, and  Education comprehension: verbalized understanding, returned demonstration, and verbal cues required  HOME EXERCISE PROGRAM: 09/16/22- tendon gliding  GOALS: Goals reviewed with patient? Yes potential goals discussed  SHORT TERM GOALS: Target date: 10/14/22  I with initial HEP. Baseline:dependent Goal status:met 10/07/22  2.  Pt will increase LUE grip strength to 10 lbs or greater for increased functional use. Baseline: Grip strength: Right: 5 lbs; Left: 45 lbs Goal status: met 18 lbs 10/07/22  3.  Pt will demonstrate improved LUE fine motor as evidenced by decreasing 9 hole peg test to completing test in 2 mins or less. Baseline: 8 pegs placed in 2 mins with LUE Goal status: ongoing  4.  Pt will consistently use LUE as a gross assist at least 25% of the time with pain less than or equal to 4/10. Baseline: uses LUE less than 10%, pain 5/10 Goal status: ongoing  5.  Pt will demonstrate ability to retrieve a lightweight object at 125 shoulder flexion and -20 elbow extension without drops using LUE. Baseline: LUE shoulder flexion 120, elbow extension -25 Goal status: ongoing  6.  Pt will perform all basic ADLS mod I. Baseline: Pt requires assist with tying shoes, buttons, styling hair Goal status: met, pt was able to tie shoes today, pt reports styling her own hair her own hair and fastening buttons  LONG TERM GOALS: Target date: 11/18/22  I with updated HEP Baseline: dependent Goal status: INITIAL  2.  Pt will increase LUE grip  strength to 25 lbs or greater for increased functional use. Baseline: Grip strength: Right: 5 lbs; Left: 45 lbs Goal status: 18 lbs  3.  Pt will demonstrate improved LUE fine motor for ADLS as evidenced by decreasing 9 hole peg test to completing test in 1 min 45 secs. Baseline: 8 pegs placed in 2 mins with LUE Goal status: INITIAL  4.  Pt will consistently use LUE as a gross assist at least 40% of the time with pain less than or equal to 4/10. Baseline: uses LUE less than 10%, pain 5/10 Goal status: INITIAL   5.  Pt. will improve Quick Dash score to 55% disability or less. Baseline: 61.3% disability 09/30/22 Goal status: new  6.  Pt will resume performance of light home management and cooking at a mod I level. Baseline: dependent Goal status: INITIAL  ASSESSMENT:  CLINICAL IMPRESSION: Patient is progressing towards goals She demonstrates improving functional use of LUE, yet she remains limited by decreased strength. PERFORMANCE DEFICITS: in functional skills including ADLs, IADLs, coordination, dexterity, sensation, edema, ROM, strength, pain, flexibility, Fine motor control, Gross motor control, decreased knowledge of precautions, decreased knowledge of use of DME, wound, skin integrity, and UE functional use, psychosocial skills including coping strategies, environmental adaptation, habits, interpersonal interactions, and routines and behaviors.   IMPAIRMENTS: are limiting patient from ADLs, IADLs, rest and sleep, play, leisure, and social  participation.   COMORBIDITIES: may have co-morbidities  that affects occupational performance. Patient will benefit from skilled OT to address above impairments and improve overall function.  MODIFICATION OR ASSISTANCE TO COMPLETE EVALUATION: Min-Moderate modification of tasks or assist with assess necessary to complete an evaluation.  OT OCCUPATIONAL PROFILE AND HISTORY: Detailed assessment: Review of records and additional review of physical,  cognitive, psychosocial history related to current functional performance.  CLINICAL DECISION MAKING: LOW - limited treatment options, no task modification necessary  REHAB POTENTIAL: Good  EVALUATION COMPLEXITY: Low      PLAN:  OT FREQUENCY: 2x/week  OT DURATION: 8 weeks plus eval  PLANNED INTERVENTIONS: self care/ADL training, therapeutic exercise, therapeutic activity, neuromuscular re-education, manual therapy, scar mobilization, manual lymph drainage, passive range of motion, splinting, ultrasound, paraffin, moist heat, cryotherapy, contrast bath, patient/family education, energy conservation, coping strategies training, DME and/or AE instructions, and Re-evaluation  RECOMMENDED OTHER SERVICES: n/a  CONSULTED AND AGREED WITH PLAN OF CARE: Patient  PLAN FOR NEXT SESSION: functional use of LUE, review putty ex   Davionna Blacksher, OT 10/07/2022, 2:01 PM

## 2022-10-09 ENCOUNTER — Ambulatory Visit: Payer: Medicare HMO | Admitting: Occupational Therapy

## 2022-10-09 DIAGNOSIS — M79602 Pain in left arm: Secondary | ICD-10-CM | POA: Diagnosis not present

## 2022-10-09 DIAGNOSIS — M25642 Stiffness of left hand, not elsewhere classified: Secondary | ICD-10-CM | POA: Diagnosis not present

## 2022-10-09 DIAGNOSIS — N2581 Secondary hyperparathyroidism of renal origin: Secondary | ICD-10-CM | POA: Diagnosis not present

## 2022-10-09 DIAGNOSIS — M6281 Muscle weakness (generalized): Secondary | ICD-10-CM | POA: Diagnosis not present

## 2022-10-09 DIAGNOSIS — R208 Other disturbances of skin sensation: Secondary | ICD-10-CM | POA: Diagnosis not present

## 2022-10-09 DIAGNOSIS — N186 End stage renal disease: Secondary | ICD-10-CM | POA: Diagnosis not present

## 2022-10-09 DIAGNOSIS — M25612 Stiffness of left shoulder, not elsewhere classified: Secondary | ICD-10-CM

## 2022-10-09 DIAGNOSIS — M79642 Pain in left hand: Secondary | ICD-10-CM | POA: Diagnosis not present

## 2022-10-09 DIAGNOSIS — M25622 Stiffness of left elbow, not elsewhere classified: Secondary | ICD-10-CM | POA: Diagnosis not present

## 2022-10-09 DIAGNOSIS — R278 Other lack of coordination: Secondary | ICD-10-CM | POA: Diagnosis not present

## 2022-10-09 DIAGNOSIS — Z992 Dependence on renal dialysis: Secondary | ICD-10-CM | POA: Diagnosis not present

## 2022-10-09 DIAGNOSIS — R600 Localized edema: Secondary | ICD-10-CM | POA: Diagnosis not present

## 2022-10-09 DIAGNOSIS — E1022 Type 1 diabetes mellitus with diabetic chronic kidney disease: Secondary | ICD-10-CM | POA: Diagnosis not present

## 2022-10-09 NOTE — Patient Instructions (Signed)
Warning Signs of a Stroke A stroke is a medical emergency. It should be treated right away. A stroke happens when there is not enough blood flow to the brain. A stroke can lead to brain damage and death. But if a person gets treated right away, they have a better chance of surviving and recovering. It is very important to recognize the symptoms of a stroke. What types of strokes are there? There are two main types of strokes: Ischemic stroke. This is the most common type. It happens when a blood vessel that sends blood to the brain is blocked. Hemorrhagic stroke. This happens when there is bleeding in the brain. This may be from a blood vessel leaking or bursting. A transient ischemic attack (TIA) causes the same symptoms as a stroke. But the symptoms go away quickly and do not cause lasting damage to the brain. TIAs still need to be treated right away. They are also a sign that you are at higher risk for a stroke. What are the warning signs of a stroke? The symptoms of a stroke may differ based on the part of the brain that is involved. Symptoms often happen all of a sudden. "BE FAST" symptoms "BE FAST" is an easy way to remember the main warning signs of a stroke: B - Balance. Signs are dizziness, sudden trouble walking, or loss of balance. E - Eyes. Signs are trouble seeing or a sudden change in vision. F - Face. Signs are sudden weakness or numbness of the face, or the face or eyelid drooping on one side. A - Arms. Signs are weakness or numbness in an arm. This happens suddenly and usually on one side of the body. S - Speech. Signs are sudden trouble speaking, slurred speech, or trouble understanding what people say. T - Time. Time to call emergency services. Write down what time symptoms started. A stroke may be happening even if only one "BE FAST" symptom is present. Other signs of a stroke Some less common signs of a stroke include: A sudden, severe headache with no known cause. Nausea  or vomiting. Seizure. These symptoms may be an emergency. Get help right away. Call 911. Do not wait to see if the symptoms will go away. Do not drive yourself to the hospital.  This information is not intended to replace advice given to you by your health care provider. Make sure you discuss any questions you have with your health care provider. Document Revised: 04/21/2022 Document Reviewed: 04/21/2022 Elsevier Patient Education  2023 [employer]  

## 2022-10-09 NOTE — Therapy (Signed)
OUTPATIENT OCCUPATIONAL THERAPY ORTHO Treatment  Patient Name: [name] MRN: [id number] DOB:1976/07/01, 47 y.o., female Today's Date: 10/09/2022  PCP: Dr. Vista Lawman REFERRING PROVIDER: Karoline Caldwell PA-C  END OF SESSION:  OT End of Session - 10/09/22 1407     Visit Number 5    Number of Visits 17    Date for OT Re-Evaluation 11/18/22    Authorization Type humana, MCD    Authorization Time Period 9 weeks    Authorization - Visit Number 5    Progress Note Due on Visit 10    OT Start Time 1405    OT Stop Time 1430    OT Time Calculation (min) 25 min                Past Medical History:  Diagnosis Date   Anemia    Arthritis    Ascites 03/28/2020   Bacteremia 02/11/2020   Cerebral infarction due to unspecified mechanism 05/22/2014   CHF (congestive heart failure) (HCC)    Diabetes mellitus    Diabetic retinopathy ([place])    Hx of laser Rx's   DM type 1 (diabetes mellitus, type 1) ([place]) 09/12/2014   Age of onset for DM type 1 was age 43.      Enlarged thyroid gland    ESRD on hemodialysis ([place])    ESRD due to DM type I, age of onset DM 1 was age 8.  Went on dialysis in March 2012.  Gets HD now at [facility] on a TTS schedule.     ESRD on hemodialysis ([place]) 09/12/2014   ESRD due to DM type 1.  Started HD in 2012 at [facility].  Gets HD there on a TTS schedule.     GERD (gastroesophageal reflux disease)    HCAP (healthcare-associated pneumonia) 09/11/2014   Hemorrhage from arteriovenous dialysis graft ([place]) 10/20/2019   History of blood transfusion    Hyperlipidemia    Hypertension    Hypertensive emergency 05/22/2014   Peripheral vascular disease ([place])    Pneumonia    Past Surgical History:  Procedure Laterality Date   A/V FISTULAGRAM Right 10/31/2020   Procedure: A/V FISTULAGRAM;  Surgeon: Cherre Robins, MD;  Location: [facility];  Service: Cardiovascular;  Laterality: Right;   ARTERIOVENOUS GRAFT PLACEMENT     ARTERY REPAIR Left  12/10/2012   Procedure: BRACHIAL ARTERY REPAIR;  Surgeon: Angelia Mould, MD;  Location: [facility];  Service: Vascular;  Laterality: Left;  Exploration Left Brachial Artery for AVF   AV FISTULA PLACEMENT Right 01/11/2018   Procedure: INSERTION OF ARTERIOVENOUS (AV) GORE-TEX GRAFT  UPPER ARM;  Surgeon: Angelia Mould, MD;  Location: [facility];  Service: Vascular;  Laterality: Right;   [place] REMOVAL Left 08/12/2022   Procedure: LEFT ARM ARTERIOVENOUS GRAFT EXCISION WITH VEIN PATCH ANGIOPLASTY;  Surgeon: Waynetta Sandy, MD;  Location: [facility];  Service: Vascular;  Laterality: Left;   [place] Right 09/28/2017   Procedure: FIRST STAGE BASILIC VEIN TRANSPOSITION;  Surgeon: Angelia Mould, MD;  Location: [facility];  Service: Vascular;  Laterality: Right;   CESAREAN SECTION     CHEST TUBE INSERTION Right 06/01/2020   Procedure: CHEST TUBE INSERTION;  Surgeon: Garner Nash, DO;  Location: [facility];  Service: Pulmonary;  Laterality: Right;   CHEST TUBE INSERTION Right 07/04/2020   Procedure: INSERTION PLEURAL DRAINAGE CATHETER;  Surgeon: Melrose Nakayama, MD;  Location: [facility];  Service: Thoracic;  Laterality: Right;   EYE SURGERY  [place]   EYE SURGERY Left    IR THORACENTESIS ASP PLEURAL SPACE W/IMG GUIDE  05/31/2020   IR THORACENTESIS ASP PLEURAL SPACE W/IMG GUIDE  06/22/2020   REVISION OF ARTERIOVENOUS GORETEX GRAFT Left 08/01/2016   Procedure: REVISION OF ARTERIOVENOUS GORETEX GRAFT;  Surgeon: Angelia Mould, MD;  Location: [facility];  Service: Vascular;  Laterality: Left;   REVISION OF ARTERIOVENOUS GORETEX GRAFT Right 10/21/2019   Procedure: REVISION OF ARTERIOVENOUS GORETEX GRAFT ARM;  Surgeon: Rosetta Posner, MD;  Location: [facility];  Service: Vascular;  Laterality: Right;   RIGHT HEART CATH N/A 08/01/2020   Procedure: RIGHT HEART CATH;  Surgeon: Jolaine Artist, MD;  Location: [facility];  Service: Cardiovascular;  Laterality: N/A;    SHUNTOGRAM Left 11/30/2013   Procedure: [place];  Surgeon: Serafina Mitchell, MD;  Location: [facility];  Service: Cardiovascular;  Laterality: Left;   TEE WITHOUT CARDIOVERSION N/A 02/13/2020   Procedure: TRANSESOPHAGEAL ECHOCARDIOGRAM (TEE);  Surgeon: Josue Hector, MD;  Location: [facility];  Service: Cardiovascular;  Laterality: N/A;   TEE WITHOUT CARDIOVERSION N/A 08/15/2022   Procedure: TRANSESOPHAGEAL ECHOCARDIOGRAM (TEE);  Surgeon: Janina Mayo, MD;  Location: [facility] ENDOS[facility];  Service: Cardiovascular;  Laterality: N/A;   Patient Active Problem List   Diagnosis Date Noted   Infection of AV graft for dialysis ([place]) 08/11/2022   Staphylococcus aureus bacteremia 08/08/2022   Pneumonia, staphylococcal ([place]) 08/08/2022   Visit for wound check 07/26/2020   Pleural effusion    Difficulty breathing 05/30/2020   Exudative pleural effusion    Gram-positive bacteremia 02/11/2020   History of CVA (cerebrovascular accident) 03/11/2017   Hyperparathyroidism, secondary renal ([place])    ESRD on hemodialysis ([place]) 09/12/2014   Diabetes mellitus type I ([place]) 09/12/2014   History of diabetic retinopathy 09/12/2014   Cerebral infarction due to unspecified mechanism 05/22/2014   Essential hypertension, benign 05/22/2014   DM (diabetes mellitus) ([place]) 05/22/2014   HLD (hyperlipidemia) 05/22/2014   Cerebral infarction ([place]) 03/29/2014   Facial droop 03/29/2014   Mechanical complication of other vascular device, implant, and graft 01/12/2013   Anxiety disorder, unspecified 05/11/2012    ONSET DATE: 09/10/22-referral date  REFERRING DIAG:    R29.898 (ICD-10-CM) - Left hand weakness    THERAPY DIAG:  Pain in left hand  Pain in left arm  Stiffness of left hand, not elsewhere classified  Stiffness of left shoulder, not elsewhere classified  Stiffness of left elbow, not elsewhere classified  Other disturbances of skin sensation  Muscle weakness (generalized)  Other lack of  coordination  Rationale for Evaluation and Treatment: Rehabilitation  SUBJECTIVE:   SUBJECTIVE STATEMENT:  Pt reports L hip pain Pt accompanied by: family member  PERTINENT HISTORY:  s/p excision of left arm axillary AV loop graft, harvest of left basilic vein and vein angioplasty of left axillary artery by Dr. Donzetta Matters on 08/12/22. This was performed secondary to nonfunctioning AV graft that was infected due to MSSA bacteremia. She is on IV abx at dialysis 3x/ week through 09/23/22. Patent is having some numbness, coldness and pain in her left arm and hand. PMH ESRD on HD, HTN, HLD, anxiety, DM II, CVA   PRECAUTIONS: Other: dialysis , AV graft in RUE, on abx for infection  WEIGHT BEARING RESTRICTIONS: No  PAIN:  Are you having pain? L hip  FALLS: Has patient fallen in last 6 months? No  LIVING ENVIRONMENT: Lives with: lives with their family Lives in: Other town house  PLOF: Independent  PATIENT GOALS:  to be able to use LUE better  NEXT MD VISIT: unknown  OBJECTIVE:   HAND DOMINANCE: Right  ADLs: Overall ADLs: increased time required, difficulty using LUE Transfers/ambulation related to ADLs: Eating: pt is not able to cut normally. Grooming: needs assist with styling hair UB Dressing: mod I with pullover shirt, needs help with buttons LB Dressing:  needs assist with tying  Toileting: mod I Bathing: mod I with sponge bath, increased time Tub Shower transfers: currently sponge bathing due to wound   FUNCTIONAL OUTCOME MEASURES: Quick Dash: 61.3%- 09/30/22   UPPER EXTREMITY ROM:   RUE A/ROM WFLS.  Active ROM Right eval Left eval  Shoulder flexion  120  Shoulder abduction  80  Shoulder adduction    Shoulder extension    Shoulder internal rotation    Shoulder external rotation    Elbow flexion  130  Elbow extension  -25  Wrist flexion  55  Wrist extension  40  Wrist ulnar deviation    Wrist radial deviation    Wrist pronation  WFL  Wrist supination  90%    Left Composite finger flexion 50%, extension 90%, able to oppose digit 1 with Thumb   HAND FUNCTION: Grip strength: Right: 5 lbs; Left: 45 lbs  COORDINATION: 9 Hole Peg test: Right: 26.30 sec; Left: 8 placed in 2 mins  10/08/22- LUE 9 hole peg tesT:9 pegs placed and removed in 1 min 57 secs   SENSATION: Light touch: Impaired  Hot/Cold: Impaired   EDEMA: moderate in LUE   COGNITION: Overall cognitive status: Within functional limits for tasks assessed A OBSERVATIONS: Pt demonstrates hypersensitivity in LUE with guarding   TODAY'S TREATMENT:                                                                                                                              DATE: 10/09/22-Therapist checked 9 hole peg test, see below Tendon gliding exercises. Placing large pegs into pegboard with LUE, min verbal and tactile cues for 3 pt pinch, min difficulty and increased time Pt reports her BP was high at dialysis. (BP 181/101. ) It was checked and therapy was discontinued as a result of elevated BP. Pt is asymptomatic for CVA. She was educated in CVA warning signs and symptoms. Pt was instructed to go home and take it easy. She plans to have her dtr purchase a BP cuff.       10/07/22 BP 157/95 Tendon gliding x 10 reps  Bag crumpling exercise x 3 reps Pen rolling exercise x 10 reps mod v.c and min difficulty Rotating ball each direction x 5 reps Flipping and dealing playing cards with LUE, min v.c  Gripper set at level 1 to pick up approximately 15 blocks with mod difficulty, for sustained grip. Grade clothespins, placing and removing yellow and red as well a 1 green from target Placing and removing wooden dowel pegs from pegboard with LUE, min v.c for 3  pt pinch and avoiding compensation Closed chain shoulder flexion and abduction 10 reps , min v.c  Red flex bar for sustained grip and wrist strength x 10 reps each direction.      09/30/22-Tendon gliding exercises x 10 reps   Thumb MP and IP flexion min v.c  Pt completed Quick Dash :61.3 % disability Pt was instructed in putty HEP see pt instructions, min v.c Flipping, dealing and flicking playing cards with LUE, min difficulty v.c for techniques Stacking coins and manipulating in hand to place in container,min- mod difficulty, v.c to avoid compensation, shelf liner used for increased ease.   09/23/22-Pt reports her hand has been tight  Tendon gliding exercises 10 reps min v.c Pt was instructed in updated HEP for Thumb ROM and coordination, min v.c to avoid compensation and demonstration     09/16/22- Pt's blood sugar was low during OT eval( 53 initally). Pt was provided with a protein granola bar and fruit strip along with water. Pt's blood sugar re-bounded to 61. Pt felt much better at end of session.     PATIENT EDUCATION: Education details: 10/09/22-CVA warning signs  Person educated: Patient Education method: Explanation, Handout Education comprehension: verbalized understanding,   HOME EXERCISE PROGRAM: 09/16/22- tendon gliding  Putty issued GOALS: Goals reviewed with patient? Yes potential goals discussed  SHORT TERM GOALS: Target date: 10/14/22  I with initial HEP. Baseline:dependent Goal status:met 10/07/22  2.  Pt will increase LUE grip strength to 10 lbs or greater for increased functional use. Baseline: Grip strength: Right: 5 lbs; Left: 45 lbs Goal status: met 18 lbs 10/07/22  3.  Pt will demonstrate improved LUE fine motor as evidenced by decreasing 9 hole peg test to completing test in 2 mins or less. Baseline: 8 pegs placed in 2 mins with LUE Goal status: met , completed in 1 min 57- 10/09/22  4.  Pt will consistently use LUE as a gross assist at least 25% of the time with pain less than or equal to 4/10. Baseline: uses LUE less than 10%, pain 5/10 Goal status: ongoing  5.  Pt will demonstrate ability to retrieve a lightweight object at 125 shoulder flexion and -20 elbow extension  without drops using LUE. Baseline: LUE shoulder flexion 120, elbow extension -25 Goal status: ongoing  6.  Pt will perform all basic ADLS mod I. Baseline: Pt requires assist with tying shoes, buttons, styling hair Goal status: met, pt was able to tie shoes today, pt reports styling her own hair her own hair and fastening buttons  LONG TERM GOALS: Target date: 11/18/22  I with updated HEP Baseline: dependent Goal status: INITIAL  2.  Pt will increase LUE grip strength to 25 lbs or greater for increased functional use. Baseline: Grip strength: Right: 5 lbs; Left: 45 lbs Goal status: 18 lbs  3.  Pt will demonstrate improved LUE fine motor for ADLS as evidenced by decreasing 9 hole peg test to completing test in 1 min 45 secs. Baseline: 8 pegs placed in 2 mins with LUE Goal status: INITIAL  4.  Pt will consistently use LUE as a gross assist at least 40% of the time with pain less than or equal to 4/10. Baseline: uses LUE less than 10%, pain 5/10 Goal status: INITIAL   5.  Pt. will improve Quick Dash score to 55% disability or less. Baseline: 61.3% disability 09/30/22 Goal status: new  6.  Pt will resume performance of light home management and cooking at a mod I  level. Baseline: dependent Goal status: INITIAL  ASSESSMENT:  CLINICAL IMPRESSION: Patient is progressing towards goals she met her short term goal #3 for 9 hole peg test. She demonstrates improved fine motor coordination.  PERFORMANCE DEFICITS: in functional skills including ADLs, IADLs, coordination, dexterity, sensation, edema, ROM, strength, pain, flexibility, Fine motor control, Gross motor control, decreased knowledge of precautions, decreased knowledge of use of DME, wound, skin integrity, and UE functional use, psychosocial skills including coping strategies, environmental adaptation, habits, interpersonal interactions, and routines and behaviors.   IMPAIRMENTS: are limiting patient from ADLs, IADLs, rest and  sleep, play, leisure, and social participation.   COMORBIDITIES: may have co-morbidities  that affects occupational performance. Patient will benefit from skilled OT to address above impairments and improve overall function.  MODIFICATION OR ASSISTANCE TO COMPLETE EVALUATION: Min-Moderate modification of tasks or assist with assess necessary to complete an evaluation.  OT OCCUPATIONAL PROFILE AND HISTORY: Detailed assessment: Review of records and additional review of physical, cognitive, psychosocial history related to current functional performance.  CLINICAL DECISION MAKING: LOW - limited treatment options, no task modification necessary  REHAB POTENTIAL: Good  EVALUATION COMPLEXITY: Low      PLAN:  OT FREQUENCY: 2x/week  OT DURATION: 8 weeks plus eval  PLANNED INTERVENTIONS: self care/ADL training, therapeutic exercise, therapeutic activity, neuromuscular re-education, manual therapy, scar mobilization, manual lymph drainage, passive range of motion, splinting, ultrasound, paraffin, moist heat, cryotherapy, contrast bath, patient/family education, energy conservation, coping strategies training, DME and/or AE instructions, and Re-evaluation  RECOMMENDED OTHER SERVICES: n/a  CONSULTED AND AGREED WITH PLAN OF CARE: Patient  PLAN FOR NEXT SESSION:  continue to work twards short term goals, check to see that coordination HEP has been issued, functional use of LUE, review putty ex   Lailah Marcelli, OT 10/09/2022, 2:35 PM

## 2022-10-11 DIAGNOSIS — N2581 Secondary hyperparathyroidism of renal origin: Secondary | ICD-10-CM | POA: Diagnosis not present

## 2022-10-11 DIAGNOSIS — N186 End stage renal disease: Secondary | ICD-10-CM | POA: Diagnosis not present

## 2022-10-11 DIAGNOSIS — Z992 Dependence on renal dialysis: Secondary | ICD-10-CM | POA: Diagnosis not present

## 2022-10-14 ENCOUNTER — Ambulatory Visit: Payer: Medicare HMO | Admitting: Occupational Therapy

## 2022-10-14 ENCOUNTER — Encounter: Payer: Self-pay | Admitting: Occupational Therapy

## 2022-10-14 DIAGNOSIS — M25642 Stiffness of left hand, not elsewhere classified: Secondary | ICD-10-CM

## 2022-10-14 DIAGNOSIS — M79642 Pain in left hand: Secondary | ICD-10-CM

## 2022-10-14 DIAGNOSIS — N186 End stage renal disease: Secondary | ICD-10-CM | POA: Diagnosis not present

## 2022-10-14 DIAGNOSIS — M25612 Stiffness of left shoulder, not elsewhere classified: Secondary | ICD-10-CM

## 2022-10-14 DIAGNOSIS — R208 Other disturbances of skin sensation: Secondary | ICD-10-CM

## 2022-10-14 DIAGNOSIS — R278 Other lack of coordination: Secondary | ICD-10-CM

## 2022-10-14 DIAGNOSIS — M6281 Muscle weakness (generalized): Secondary | ICD-10-CM | POA: Diagnosis not present

## 2022-10-14 DIAGNOSIS — N2581 Secondary hyperparathyroidism of renal origin: Secondary | ICD-10-CM | POA: Diagnosis not present

## 2022-10-14 DIAGNOSIS — R600 Localized edema: Secondary | ICD-10-CM | POA: Diagnosis not present

## 2022-10-14 DIAGNOSIS — M79602 Pain in left arm: Secondary | ICD-10-CM

## 2022-10-14 DIAGNOSIS — Z992 Dependence on renal dialysis: Secondary | ICD-10-CM | POA: Diagnosis not present

## 2022-10-14 DIAGNOSIS — M25622 Stiffness of left elbow, not elsewhere classified: Secondary | ICD-10-CM

## 2022-10-14 NOTE — Patient Instructions (Signed)
Your Splint This splint should initially be fitted by a healthcare practitioner.  The healthcare practitioner is responsible for providing wearing instructions and precautions to the patient, other healthcare practitioners and care provider involved in the patient's care.  This splint was custom made for you. Please read the following instructions to learn about wearing and caring for your splint.  Precautions Should your splint cause any of the following problems, remove the splint immediately and contact your therapist/physician. Swelling Severe Pain Pressure Areas Stiffness Numbness  Do not wear your splint while operating machinery unless it has been fabricated for that purpose.  When To Wear Your Splint Where your splint according to your therapist/physician instructions. Daytime for 30 mins to 1 hour at a time, do not wear for cooking  Care and Cleaning of Your Splint Keep your splint away from open flames. Your splint will lose its shape in temperatures over 135 degrees Farenheit, ( in car windows, near radiators, ovens or in hot water).  Never make any adjustments to your splint, if the splint needs adjusting remove it and make an appointment to see your therapist. Your splint, including the cushion liner may be cleaned with soap and lukewarm water.  Do not immerse in hot water over 135 degrees Farenheit.     Coordination Activities  Perform the following activities for 10-20 minutes 1 times per day with left hand(s).  Rotate ball in fingertips (clockwise and counter-clockwise). Toss ball between hands. Flip cards 1 at a time as fast as you can. Deal cards with your thumb (Hold deck in hand and push card off top with thumb). Pick up coins and stack.

## 2022-10-14 NOTE — Therapy (Addendum)
OUTPATIENT OCCUPATIONAL THERAPY ORTHO Treatment  Patient Name: [name] MRN: [id number] DOB:03/10/1976, 47 y.o., female Today's Date: 10/14/2022  PCP: Dr. Vista Lawman REFERRING PROVIDER: Karoline Caldwell PA-C  END OF SESSION:  OT End of Session - 10/14/22 1414     Visit Number 6    Number of Visits 17    Date for OT Re-Evaluation 11/18/22    Authorization Type humana, MCD    Authorization - Visit Number 6    Progress Note Due on Visit 10    OT Start Time 1409    OT Stop Time 1450    OT Time Calculation (min) 41 min                 Past Medical History:  Diagnosis Date   Anemia    Arthritis    Ascites 03/28/2020   Bacteremia 02/11/2020   Cerebral infarction due to unspecified mechanism 05/22/2014   CHF (congestive heart failure) (HCC)    Diabetes mellitus    Diabetic retinopathy ([place])    Hx of laser Rx's   DM type 1 (diabetes mellitus, type 1) ([place]) 09/12/2014   Age of onset for DM type 1 was age 42.      Enlarged thyroid gland    ESRD on hemodialysis ([place])    ESRD due to DM type I, age of onset DM 1 was age 74.  Went on dialysis in March 2012.  Gets HD now at [facility] on a TTS schedule.     ESRD on hemodialysis ([place]) 09/12/2014   ESRD due to DM type 1.  Started HD in 2012 at [facility].  Gets HD there on a TTS schedule.     GERD (gastroesophageal reflux disease)    HCAP (healthcare-associated pneumonia) 09/11/2014   Hemorrhage from arteriovenous dialysis graft ([place]) 10/20/2019   History of blood transfusion    Hyperlipidemia    Hypertension    Hypertensive emergency 05/22/2014   Peripheral vascular disease ([place])    Pneumonia    Past Surgical History:  Procedure Laterality Date   A/V FISTULAGRAM Right 10/31/2020   Procedure: A/V FISTULAGRAM;  Surgeon: Cherre Robins, MD;  Location: [place] [facility];  Service: Cardiovascular;  Laterality: Right;   ARTERIOVENOUS GRAFT PLACEMENT     ARTERY REPAIR Left 12/10/2012   Procedure: BRACHIAL ARTERY  REPAIR;  Surgeon: Angelia Mould, MD;  Location: [place];  Service: Vascular;  Laterality: Left;  Exploration Left Brachial Artery for AVF   AV FISTULA PLACEMENT Right 01/11/2018   Procedure: INSERTION OF ARTERIOVENOUS (AV) GORE-TEX GRAFT  UPPER ARM;  Surgeon: Angelia Mould, MD;  Location: [place];  Service: Vascular;  Laterality: Right;   [place] REMOVAL Left 08/12/2022   Procedure: LEFT ARM ARTERIOVENOUS GRAFT EXCISION WITH VEIN PATCH ANGIOPLASTY;  Surgeon: Waynetta Sandy, MD;  Location: [place];  Service: Vascular;  Laterality: Left;   [place] Right 09/28/2017   Procedure: FIRST STAGE BASILIC VEIN TRANSPOSITION;  Surgeon: Angelia Mould, MD;  Location: [place];  Service: Vascular;  Laterality: Right;   CESAREAN SECTION     CHEST TUBE INSERTION Right 06/01/2020   Procedure: CHEST TUBE INSERTION;  Surgeon: Garner Nash, DO;  Location: [place];  Service: Pulmonary;  Laterality: Right;   CHEST TUBE INSERTION Right 07/04/2020   Procedure: INSERTION PLEURAL DRAINAGE CATHETER;  Surgeon: Melrose Nakayama, MD;  Location: [place];  Service: Thoracic;  Laterality: Right;   EYE SURGERY     [place]  EYE SURGERY Left    IR THORACENTESIS ASP PLEURAL SPACE W/IMG GUIDE  05/31/2020   IR THORACENTESIS ASP PLEURAL SPACE W/IMG GUIDE  06/22/2020   REVISION OF ARTERIOVENOUS GORETEX GRAFT Left 08/01/2016   Procedure: REVISION OF ARTERIOVENOUS GORETEX GRAFT;  Surgeon: Angelia Mould, MD;  Location: [place];  Service: Vascular;  Laterality: Left;   REVISION OF ARTERIOVENOUS GORETEX GRAFT Right 10/21/2019   Procedure: REVISION OF ARTERIOVENOUS GORETEX GRAFT ARM;  Surgeon: Rosetta Posner, MD;  Location: [place];  Service: Vascular;  Laterality: Right;   RIGHT HEART CATH N/A 08/01/2020   Procedure: RIGHT HEART CATH;  Surgeon: Jolaine Artist, MD;  Location: [place] [facility];  Service: Cardiovascular;  Laterality: N/A;   SHUNTOGRAM Left 11/30/2013   Procedure:  Earney Mallet;  Surgeon: Serafina Mitchell, MD;  Location: [facility] CATH LAB;  Service: Cardiovascular;  Laterality: Left;   TEE WITHOUT CARDIOVERSION N/A 02/13/2020   Procedure: TRANSESOPHAGEAL ECHOCARDIOGRAM (TEE);  Surgeon: Josue Hector, MD;  Location: [facility] ENDOSCOPY;  Service: Cardiovascular;  Laterality: N/A;   TEE WITHOUT CARDIOVERSION N/A 08/15/2022   Procedure: TRANSESOPHAGEAL ECHOCARDIOGRAM (TEE);  Surgeon: Janina Mayo, MD;  Location: [facility] ENDOSCOPY;  Service: Cardiovascular;  Laterality: N/A;   Patient Active Problem List   Diagnosis Date Noted   Infection of AV graft for dialysis ([place]) 08/11/2022   Staphylococcus aureus bacteremia 08/08/2022   Pneumonia, staphylococcal ([place]) 08/08/2022   Visit for wound check 07/26/2020   Pleural effusion    Difficulty breathing 05/30/2020   Exudative pleural effusion    Gram-positive bacteremia 02/11/2020   History of CVA (cerebrovascular accident) 03/11/2017   Hyperparathyroidism, secondary renal ([place])    ESRD on hemodialysis ([place]) 09/12/2014   Diabetes mellitus type I ([place]) 09/12/2014   History of diabetic retinopathy 09/12/2014   Cerebral infarction due to unspecified mechanism 05/22/2014   Essential hypertension, benign 05/22/2014   DM (diabetes mellitus) ([place]) 05/22/2014   HLD (hyperlipidemia) 05/22/2014   Cerebral infarction ([place]) 03/29/2014   Facial droop 03/29/2014   Mechanical complication of other vascular device, implant, and graft 01/12/2013   Anxiety disorder, unspecified 05/11/2012    ONSET DATE: 09/10/22-referral date  REFERRING DIAG:    R29.898 (ICD-10-CM) - Left hand weakness    THERAPY DIAG:  Pain in left hand  Pain in left arm  Stiffness of left hand, not elsewhere classified  Stiffness of left shoulder, not elsewhere classified  Stiffness of left elbow, not elsewhere classified  Other disturbances of skin sensation  Muscle weakness (generalized)  Other lack of coordination  Rationale for Evaluation and  Treatment: Rehabilitation  SUBJECTIVE:   SUBJECTIVE STATEMENT:  Denies pain, my sugar dropped but now it's on it's way up Pt accompanied by: self  PERTINENT HISTORY:  s/p excision of left arm axillary AV loop graft, harvest of left basilic vein and vein angioplasty of left axillary artery by Dr. Donzetta Matters on 08/12/22. This was performed secondary to nonfunctioning AV graft that was infected due to MSSA bacteremia. She is on IV abx at dialysis 3x/ week through 09/23/22. Patent is having some numbness, coldness and pain in her left arm and hand. PMH ESRD on HD, HTN, HLD, anxiety, DM II, CVA   PRECAUTIONS: Other: dialysis , AV graft in RUE, on abx for infection  WEIGHT BEARING RESTRICTIONS: No  PAIN:  Are you having pain? L hip  FALLS: Has patient fallen in last 6 months? No  LIVING ENVIRONMENT: Lives with: lives with their family Lives in: Other town house  PLOF: Independent  PATIENT GOALS:  to be able to use LUE better  NEXT MD VISIT: unknown  OBJECTIVE:   HAND DOMINANCE: Right  ADLs: Overall ADLs: increased time required, difficulty using LUE Transfers/ambulation related to ADLs: Eating: pt is not able to cut normally. Grooming: needs assist with styling hair UB Dressing: mod I with pullover shirt, needs help with buttons LB Dressing:  needs assist with tying  Toileting: mod I Bathing: mod I with sponge bath, increased time Tub Shower transfers: currently sponge bathing due to wound   FUNCTIONAL OUTCOME MEASURES: Quick Dash: 61.3%- 09/30/22   UPPER EXTREMITY ROM:   RUE A/ROM WFLS.  Active ROM Right eval Left eval  Shoulder flexion  120  Shoulder abduction  80  Shoulder adduction    Shoulder extension    Shoulder internal rotation    Shoulder external rotation    Elbow flexion  130  Elbow extension  -25  Wrist flexion  55  Wrist extension  40  Wrist ulnar deviation    Wrist radial deviation    Wrist pronation  WFL  Wrist supination  90%   Left Composite  finger flexion 50%, extension 90%, able to oppose digit 1 with Thumb   HAND FUNCTION: Grip strength: Right: 5 lbs; Left: 45 lbs  COORDINATION: 9 Hole Peg test: Right: 26.30 sec; Left: 8 placed in 2 mins  10/08/22- LUE 9 hole peg tesT:9 pegs placed and removed in 1 min 57 secs   SENSATION: Light touch: Impaired  Hot/Cold: Impaired   EDEMA: moderate in LUE   COGNITION: Overall cognitive status: Within functional limits for tasks assessed A OBSERVATIONS: Pt demonstrates hypersensitivity in LUE with guarding   TODAY'S TREATMENT:                                                                                                                              DATE: 10/14/22- BP: 141/ 80, Pt arrived a little bit late stating her sugar was high but it is coming back up, pt brought peanut butter crackers with her and she was provided with a bottled water. Tendon gliding exercises followed by review of yellow putty exercises for sustained grip and pinch, min v.c Placing small pegs into pegboard, with LUE, grossly 5 pegs mod-max difficulty, then removing with LUE  Placing and removing yellow and red clothespins for sustained pinch  Reviewed coordination HEP and progressed to stacking pennies instead of quarters. Pt has difficulty due to index finger DIP joint is laterally flexed . Small DIP ring splint was fabricated to place DIP joint into better alignment. Pt was instructed in splint wear and care, and not to wear when cooking, cutting or holding breakable items.    10/09/22-Therapist checked 9 hole peg test, see below Tendon gliding exercises. Placing large pegs into pegboard with LUE, min verbal and tactile cues for 3 pt pinch, min difficulty and increased time Pt reports her BP was high at dialysis. (BP 181/101. )  It was checked and therapy was discontinued as a result of elevated BP. Pt is asymptomatic for CVA. She was educated in CVA warning signs and symptoms. Pt was instructed to go home  and take it easy. She plans to have her dtr purchase a BP cuff.       10/07/22 BP 157/95 Tendon gliding x 10 reps  Bag crumpling exercise x 3 reps Pen rolling exercise x 10 reps mod v.c and min difficulty Rotating ball each direction x 5 reps Flipping and dealing playing cards with LUE, min v.c  Gripper set at level 1 to pick up approximately 15 blocks with mod difficulty, for sustained grip. Grade clothespins, placing and removing yellow and red as well a 1 green from target Placing and removing wooden dowel pegs from pegboard with LUE, min v.c for 3 pt pinch and avoiding compensation Closed chain shoulder flexion and abduction 10 reps , min v.c  Red flex bar for sustained grip and wrist strength x 10 reps each direction.      09/30/22-Tendon gliding exercises x 10 reps  Thumb MP and IP flexion min v.c  Pt completed Quick Dash :61.3 % disability Pt was instructed in putty HEP see pt instructions, min v.c Flipping, dealing and flicking playing cards with LUE, min difficulty v.c for techniques Stacking coins and manipulating in hand to place in container,min- mod difficulty, v.c to avoid compensation, shelf liner used for increased ease.   09/23/22-Pt reports her hand has been tight  Tendon gliding exercises 10 reps min v.c Pt was instructed in updated HEP for Thumb ROM and coordination, min v.c to avoid compensation and demonstration     09/16/22- Pt's blood sugar was low during OT eval( 53 initally). Pt was provided with a protein granola bar and fruit strip along with water. Pt's blood sugar re-bounded to 61. Pt felt much better at end of session.     PATIENT EDUCATION: Education details: splint wear, care and precautions, putty review, coordination review and updates. Person educated: Patient Education method: Explanation, Demonstration, Verbal cues, and Handouts Education comprehension: verbalized understanding, returned demonstration, and verbal cues required   HOME  EXERCISE PROGRAM: 09/16/22- tendon gliding  Putty issued GOALS: Goals reviewed with patient? Yes potential goals discussed  SHORT TERM GOALS: Target date: 10/14/22  I with initial HEP. Baseline:dependent Goal status:met 10/07/22  2.  Pt will increase LUE grip strength to 10 lbs or greater for increased functional use. Baseline: Grip strength: Right: 5 lbs; Left: 45 lbs Goal status: met 18 lbs 10/07/22  3.  Pt will demonstrate improved LUE fine motor as evidenced by decreasing 9 hole peg test to completing test in 2 mins or less. Baseline: 8 pegs placed in 2 mins with LUE Goal status: met , completed in 1 min 57- 10/09/22  4.  Pt will consistently use LUE as a gross assist at least 25% of the time with pain less than or equal to 4/10. Baseline: uses LUE less than 10%, pain 5/10 Goal status: ongoing  5.  Pt will demonstrate ability to retrieve a lightweight object at 125 shoulder flexion and -20 elbow extension without drops using LUE. Baseline: LUE shoulder flexion 120, elbow extension -25 Goal status: ongoing  6.  Pt will perform all basic ADLS mod I. Baseline: Pt requires assist with tying shoes, buttons, styling hair Goal status: met, pt was able to tie shoes today, pt reports styling her own hair her own hair and fastening buttons  LONG TERM GOALS: Target  date: 11/18/22  I with updated HEP Baseline: dependent Goal status: INITIAL  2.  Pt will increase LUE grip strength to 25 lbs or greater for increased functional use. Baseline: Grip strength: Right: 5 lbs; Left: 45 lbs Goal status: 18 lbs  3.  Pt will demonstrate improved LUE fine motor for ADLS as evidenced by decreasing 9 hole peg test to completing test in 1 min 45 secs. Baseline: 8 pegs placed in 2 mins with LUE Goal status: INITIAL  4.  Pt will consistently use LUE as a gross assist at least 40% of the time with pain less than or equal to 4/10. Baseline: uses LUE less than 10%, pain 5/10 Goal status:  INITIAL   5.  Pt. will improve Quick Dash score to 55% disability or less. Baseline: 61.3% disability 09/30/22 Goal status: new  6.  Pt will resume performance of light home management and cooking at a mod I level. Baseline: dependent Goal status: INITIAL  ASSESSMENT:  CLINICAL IMPRESSION: Patient is progressing towards goals . She demonstrates continued improving coordination and functional use of LUE.  PERFORMANCE DEFICITS: in functional skills including ADLs, IADLs, coordination, dexterity, sensation, edema, ROM, strength, pain, flexibility, Fine motor control, Gross motor control, decreased knowledge of precautions, decreased knowledge of use of DME, wound, skin integrity, and UE functional use, psychosocial skills including coping strategies, environmental adaptation, habits, interpersonal interactions, and routines and behaviors.   IMPAIRMENTS: are limiting patient from ADLs, IADLs, rest and sleep, play, leisure, and social participation.   COMORBIDITIES: may have co-morbidities  that affects occupational performance. Patient will benefit from skilled OT to address above impairments and improve overall function.  MODIFICATION OR ASSISTANCE TO COMPLETE EVALUATION: Min-Moderate modification of tasks or assist with assess necessary to complete an evaluation.  OT OCCUPATIONAL PROFILE AND HISTORY: Detailed assessment: Review of records and additional review of physical, cognitive, psychosocial history related to current functional performance.  CLINICAL DECISION MAKING: LOW - limited treatment options, no task modification necessary  REHAB POTENTIAL: Good  EVALUATION COMPLEXITY: Low      PLAN:  OT FREQUENCY: 2x/week  OT DURATION: 8 weeks plus eval  PLANNED INTERVENTIONS: self care/ADL training, therapeutic exercise, therapeutic activity, neuromuscular re-education, manual therapy, scar mobilization, manual lymph drainage, passive range of motion, splinting, ultrasound,  paraffin, moist heat, cryotherapy, contrast bath, patient/family education, energy conservation, coping strategies training, DME and/or AE instructions, and Re-evaluation  RECOMMENDED OTHER SERVICES: n/a  CONSULTED AND AGREED WITH PLAN OF CARE: Patient  PLAN FOR NEXT SESSION:  check short term goals Izabella Marcantel, OT 10/14/2022, 2:18 PM

## 2022-10-16 DIAGNOSIS — N2581 Secondary hyperparathyroidism of renal origin: Secondary | ICD-10-CM | POA: Diagnosis not present

## 2022-10-16 DIAGNOSIS — N186 End stage renal disease: Secondary | ICD-10-CM | POA: Diagnosis not present

## 2022-10-16 DIAGNOSIS — Z992 Dependence on renal dialysis: Secondary | ICD-10-CM | POA: Diagnosis not present

## 2022-10-18 DIAGNOSIS — N186 End stage renal disease: Secondary | ICD-10-CM | POA: Diagnosis not present

## 2022-10-18 DIAGNOSIS — Z992 Dependence on renal dialysis: Secondary | ICD-10-CM | POA: Diagnosis not present

## 2022-10-18 DIAGNOSIS — N2581 Secondary hyperparathyroidism of renal origin: Secondary | ICD-10-CM | POA: Diagnosis not present

## 2022-10-19 DIAGNOSIS — N186 End stage renal disease: Secondary | ICD-10-CM | POA: Diagnosis not present

## 2022-10-19 DIAGNOSIS — E1029 Type 1 diabetes mellitus with other diabetic kidney complication: Secondary | ICD-10-CM | POA: Diagnosis not present

## 2022-10-19 DIAGNOSIS — Z992 Dependence on renal dialysis: Secondary | ICD-10-CM | POA: Diagnosis not present

## 2022-10-20 NOTE — Therapy (Unsigned)
OUTPATIENT OCCUPATIONAL THERAPY ORTHO Treatment  Patient Name: [name] MRN: [id number] DOB:1975/09/23, 47 y.o., female Today's Date: 10/21/2022  PCP: Dr. Vista Lawman REFERRING PROVIDER: Karoline Caldwell PA-C  END OF SESSION:  OT End of Session - 10/21/22 1417     Visit Number 7    Number of Visits 17    Date for OT Re-Evaluation 11/18/22    Authorization Type humana, MCD    Authorization Time Period 9 weeks    Progress Note Due on Visit 10    OT Start Time 1414    OT Stop Time 1445    OT Time Calculation (min) 31 min                  Past Medical History:  Diagnosis Date   Anemia    Arthritis    Ascites 03/28/2020   Bacteremia 02/11/2020   Cerebral infarction due to unspecified mechanism 05/22/2014   CHF (congestive heart failure)    Diabetes mellitus    Diabetic retinopathy    Hx of laser Rx's   DM type 1 (diabetes mellitus, type 1) 09/12/2014   Age of onset for DM type 1 was age 56.      Enlarged thyroid gland    ESRD on hemodialysis    ESRD due to DM type I, age of onset DM 1 was age 70.  Went on dialysis in March 2012.  Gets HD now at [facility] on a TTS schedule.     ESRD on hemodialysis 09/12/2014   ESRD due to DM type 1.  Started HD in 2012 at [facility].  Gets HD there on a TTS schedule.     GERD (gastroesophageal reflux disease)    HCAP (healthcare-associated pneumonia) 09/11/2014   Hemorrhage from arteriovenous dialysis graft 10/20/2019   History of blood transfusion    Hyperlipidemia    Hypertension    Hypertensive emergency 05/22/2014   Peripheral vascular disease    Pneumonia    Past Surgical History:  Procedure Laterality Date   A/V FISTULAGRAM Right 10/31/2020   Procedure: A/V FISTULAGRAM;  Surgeon: Cherre Robins, MD;  Location: [place] [facility];  Service: Cardiovascular;  Laterality: Right;   ARTERIOVENOUS GRAFT PLACEMENT     ARTERY REPAIR Left 12/10/2012   Procedure: BRACHIAL ARTERY REPAIR;  Surgeon: Angelia Mould, MD;  Location: [place];  Service: Vascular;  Laterality: Left;  Exploration Left Brachial Artery for AVF   AV FISTULA PLACEMENT Right 01/11/2018   Procedure: INSERTION OF ARTERIOVENOUS (AV) GORE-TEX GRAFT  UPPER ARM;  Surgeon: Angelia Mould, MD;  Location: [place];  Service: Vascular;  Laterality: Right;   [place] REMOVAL Left 08/12/2022   Procedure: LEFT ARM ARTERIOVENOUS GRAFT EXCISION WITH VEIN PATCH ANGIOPLASTY;  Surgeon: Waynetta Sandy, MD;  Location: [place];  Service: Vascular;  Laterality: Left;   [place] Right 09/28/2017   Procedure: FIRST STAGE BASILIC VEIN TRANSPOSITION;  Surgeon: Angelia Mould, MD;  Location: [place];  Service: Vascular;  Laterality: Right;   CESAREAN SECTION     CHEST TUBE INSERTION Right 06/01/2020   Procedure: CHEST TUBE INSERTION;  Surgeon: Garner Nash, DO;  Location: [place];  Service: Pulmonary;  Laterality: Right;   CHEST TUBE INSERTION Right 07/04/2020   Procedure: INSERTION PLEURAL DRAINAGE CATHETER;  Surgeon: Melrose Nakayama, MD;  Location: [place];  Service: Thoracic;  Laterality: Right;   EYE SURGERY     [place]   EYE SURGERY Left  IR THORACENTESIS ASP PLEURAL SPACE W/IMG GUIDE  05/31/2020   IR THORACENTESIS ASP PLEURAL SPACE W/IMG GUIDE  06/22/2020   REVISION OF ARTERIOVENOUS GORETEX GRAFT Left 08/01/2016   Procedure: REVISION OF ARTERIOVENOUS GORETEX GRAFT;  Surgeon: Angelia Mould, MD;  Location: [place];  Service: Vascular;  Laterality: Left;   REVISION OF ARTERIOVENOUS GORETEX GRAFT Right 10/21/2019   Procedure: REVISION OF ARTERIOVENOUS GORETEX GRAFT ARM;  Surgeon: Rosetta Posner, MD;  Location: [place];  Service: Vascular;  Laterality: Right;   RIGHT HEART CATH N/A 08/01/2020   Procedure: RIGHT HEART CATH;  Surgeon: Jolaine Artist, MD;  Location: [place] [facility];  Service: Cardiovascular;  Laterality: N/A;   SHUNTOGRAM Left 11/30/2013   Procedure: Earney Mallet;  Surgeon: Serafina Mitchell, MD;  Location: [facility] CATH LAB;  Service: Cardiovascular;  Laterality: Left;   TEE WITHOUT CARDIOVERSION N/A 02/13/2020   Procedure: TRANSESOPHAGEAL ECHOCARDIOGRAM (TEE);  Surgeon: Josue Hector, MD;  Location: [facility];  Service: Cardiovascular;  Laterality: N/A;   TEE WITHOUT CARDIOVERSION N/A 08/15/2022   Procedure: TRANSESOPHAGEAL ECHOCARDIOGRAM (TEE);  Surgeon: Janina Mayo, MD;  Location: [facility]OS[facility];  Service: Cardiovascular;  Laterality: N/A;   Patient Active Problem List   Diagnosis Date Noted   Infection of AV graft for dialysis 08/11/2022   Staphylococcus aureus bacteremia 08/08/2022   Pneumonia, staphylococcal 08/08/2022   Visit for wound check 07/26/2020   Pleural effusion    Difficulty breathing 05/30/2020   Exudative pleural effusion    Gram-positive bacteremia 02/11/2020   History of CVA (cerebrovascular accident) 03/11/2017   Hyperparathyroidism, secondary renal    ESRD on hemodialysis 09/12/2014   Diabetes mellitus type I 09/12/2014   History of diabetic retinopathy 09/12/2014   Cerebral infarction due to unspecified mechanism 05/22/2014   Essential hypertension, benign 05/22/2014   DM (diabetes mellitus) 05/22/2014   HLD (hyperlipidemia) 05/22/2014   Cerebral infarction 03/29/2014   Facial droop 03/29/2014   Mechanical complication of other vascular device, implant, and graft 01/12/2013   Anxiety disorder, unspecified 05/11/2012    ONSET DATE: 09/10/22-referral date  REFERRING DIAG:    R29.898 (ICD-10-CM) - Left hand weakness    THERAPY DIAG:  Pain in left hand  Pain in left arm  Stiffness of left hand, not elsewhere classified  Stiffness of left shoulder, not elsewhere classified  Stiffness of left elbow, not elsewhere classified  Other disturbances of skin sensation  Muscle weakness (generalized)  Other lack of coordination  Localized edema  Rationale for Evaluation and Treatment: Rehabilitation  SUBJECTIVE:   SUBJECTIVE  STATEMENT:  Denies pain Pt accompanied by: self  PERTINENT HISTORY:  s/p excision of left arm axillary AV loop graft, harvest of left basilic vein and vein angioplasty of left axillary artery by Dr. Donzetta Matters on 08/12/22. This was performed secondary to nonfunctioning AV graft that was infected due to MSSA bacteremia. She is on IV abx at dialysis 3x/ week through 09/23/22. Patent is having some numbness, coldness and pain in her left arm and hand. PMH ESRD on HD, HTN, HLD, anxiety, DM II, CVA   PRECAUTIONS: Other: dialysis , AV graft in RUE, on abx for infection  WEIGHT BEARING RESTRICTIONS: No  PAIN:  Are you having pain? L hip  FALLS: Has patient fallen in last 6 months? No  LIVING ENVIRONMENT: Lives with: lives with their family Lives in: Other town house  PLOF: Independent  PATIENT GOALS:  to be able to use LUE better  NEXT MD VISIT: unknown  OBJECTIVE:  HAND DOMINANCE: Right  ADLs: Overall ADLs: increased time required, difficulty using LUE Transfers/ambulation related to ADLs: Eating: pt is not able to cut normally. Grooming: needs assist with styling hair UB Dressing: mod I with pullover shirt, needs help with buttons LB Dressing:  needs assist with tying  Toileting: mod I Bathing: mod I with sponge bath, increased time Tub Shower transfers: currently sponge bathing due to wound   FUNCTIONAL OUTCOME MEASURES: Quick Dash: 61.3%- 09/30/22   UPPER EXTREMITY ROM:   RUE A/ROM WFLS.  Active ROM Right eval Left eval  Shoulder flexion  120  Shoulder abduction  80  Shoulder adduction    Shoulder extension    Shoulder internal rotation    Shoulder external rotation    Elbow flexion  130  Elbow extension  -25  Wrist flexion  55  Wrist extension  40  Wrist ulnar deviation    Wrist radial deviation    Wrist pronation  WFL  Wrist supination  90%   Left Composite finger flexion 50%, extension 90%, able to oppose digit 1 with Thumb   HAND FUNCTION: Grip strength:  Right: 5 lbs; Left: 45 lbs  COORDINATION: 9 Hole Peg test: Right: 26.30 sec; Left: 8 placed in 2 mins  10/08/22- LUE 9 hole peg tesT:9 pegs placed and removed in 1 min 57 secs   SENSATION: Light touch: Impaired  Hot/Cold: Impaired   EDEMA: moderate in LUE   COGNITION: Overall cognitive status: Within functional limits for tasks assessed A OBSERVATIONS: Pt demonstrates hypersensitivity in LUE with guarding   TODAY'S TREATMENT:                                                                                                                              DATE: 10/21/22- BP 132/72 Tendon gliding exercises Red putty issued for sustained grip and pinch, min v.c  Cane exercises for shoulder flexion and chest press, min v.c Tying shoe with leg crossed across knee with improved performance. Pt was instructed to consider using finger splint while tying shoe. Pt left at home. Yellow theraband exercises 10 reps each, min v.c and demonstration. See pt. instructions   10/14/22- BP: 141/ 80, Pt arrived a little bit late stating her sugar was high but it is coming back up, pt brought peanut butter crackers with her and she was provided with a bottled water. Tendon gliding exercises followed by review of yellow putty exercises for sustained grip and pinch, min v.c Placing small pegs into pegboard, with LUE, grossly 5 pegs mod-max difficulty, then removing with LUE  Placing and removing yellow and red clothespins for sustained pinch  Reviewed coordination HEP and progressed to stacking pennies instead of quarters. Pt has difficulty due to index finger DIP joint is laterally flexed . Small DIP ring splint was fabricated to place DIP joint into better alignment. Pt was instructed in splint wear and care, and not to wear when cooking, cutting or holding breakable items.  10/09/22-Therapist checked 9 hole peg test, see below Tendon gliding exercises. Placing large pegs into pegboard with LUE, min verbal  and tactile cues for 3 pt pinch, min difficulty and increased time Pt reports her BP was high at dialysis. (BP 181/101. ) It was checked and therapy was discontinued as a result of elevated BP. Pt is asymptomatic for CVA. She was educated in CVA warning signs and symptoms. Pt was instructed to go home and take it easy. She plans to have her dtr purchase a BP cuff.       10/07/22 BP 157/95 Tendon gliding x 10 reps  Bag crumpling exercise x 3 reps Pen rolling exercise x 10 reps mod v.c and min difficulty Rotating ball each direction x 5 reps Flipping and dealing playing cards with LUE, min v.c  Gripper set at level 1 to pick up approximately 15 blocks with mod difficulty, for sustained grip. Grade clothespins, placing and removing yellow and red as well a 1 green from target Placing and removing wooden dowel pegs from pegboard with LUE, min v.c for 3 pt pinch and avoiding compensation Closed chain shoulder flexion and abduction 10 reps , min v.c  Red flex bar for sustained grip and wrist strength x 10 reps each direction.      09/30/22-Tendon gliding exercises x 10 reps  Thumb MP and IP flexion min v.c  Pt completed Quick Dash :61.3 % disability Pt was instructed in putty HEP see pt instructions, min v.c Flipping, dealing and flicking playing cards with LUE, min difficulty v.c for techniques Stacking coins and manipulating in hand to place in container,min- mod difficulty, v.c to avoid compensation, shelf liner used for increased ease.   09/23/22-Pt reports her hand has been tight  Tendon gliding exercises 10 reps min v.c Pt was instructed in updated HEP for Thumb ROM and coordination, min v.c to avoid compensation and demonstration     09/16/22- Pt's blood sugar was low during OT eval( 53 initally). Pt was provided with a protein granola bar and fruit strip along with water. Pt's blood sugar re-bounded to 61. Pt felt much better at end of session.     PATIENT EDUCATION: Education  details: red putty HEP, theraband - yellow HEPPerson educated: Patient Education method: Explanation, Demonstration, Verbal cues, and Handouts Education comprehension: verbalized understanding, returned demonstration, and verbal cues required   HOME EXERCISE PROGRAM: 09/16/22- tendon gliding  Putty issued GOALS: Goals reviewed with patient? Yes potential goals discussed  SHORT TERM GOALS: Target date: 10/14/22  I with initial HEP. Baseline:dependent Goal status:met 10/07/22  2.  Pt will increase LUE grip strength to 10 lbs or greater for increased functional use. Baseline: Grip strength: Right: 5 lbs; Left: 45 lbs Goal status: met 18 lbs 10/07/22  3.  Pt will demonstrate improved LUE fine motor as evidenced by decreasing 9 hole peg test to completing test in 2 mins or less. Baseline: 8 pegs placed in 2 mins with LUE Goal status: met , completed in 1 min 57- 10/09/22  4.  Pt will consistently use LUE as a gross assist at least 25% of the time with pain less than or equal to 4/10. Baseline: uses LUE less than 10%, pain 5/10 Goal status: met, uses 50% of the time, no reports of pain today.  5.  Pt will demonstrate ability to retrieve a lightweight object at 125 shoulder flexion and -20 elbow extension without drops using LUE. Baseline: LUE shoulder flexion 120, elbow extension -25 Goal status: met,130, -  15  6.  Pt will perform all basic ADLS mod I. Baseline: Pt requires assist with tying shoes, buttons, styling hair Goal status: met, pt was able to tie shoes today, pt reports styling her own hair her own hair and fastening buttons  LONG TERM GOALS: Target date: 11/18/22  I with updated HEP Baseline: dependent Goal status:ongoing, red putty issued 10/21/22  2.  Pt will increase LUE grip strength to 25 lbs or greater for increased functional use. Baseline: Grip strength: Right: 5 lbs; Left: 45 lbs Goal status ongoing : 18 lbs  3.  Pt will demonstrate improved LUE fine motor for ADLS  as evidenced by decreasing 9 hole peg test to completing test in 1 min 45 secs. Baseline: 8 pegs placed in 2 mins with LUE Goal status: ongoing  4.  Pt will consistently use LUE as a gross assist at least 40% of the time with pain less than or equal to 4/10. Baseline: uses LUE less than 10%, pain 5/10 Goal status:  met uses 50% of the time, pain, no pin   5.  Pt. will improve Quick Dash score to 55% disability or less. Baseline: 61.3% disability 09/30/22 Goal status: ongoing  6.  Pt will resume performance of light home management and cooking at a mod I level. Baseline: dependent Goal status: ongoing  ASSESSMENT:  CLINICAL IMPRESSION: Patient is progressing towards goals with improved LUE function. . She met all short term goals.  PERFORMANCE DEFICITS: in functional skills including ADLs, IADLs, coordination, dexterity, sensation, edema, ROM, strength, pain, flexibility, Fine motor control, Gross motor control, decreased knowledge of precautions, decreased knowledge of use of DME, wound, skin integrity, and UE functional use, psychosocial skills including coping strategies, environmental adaptation, habits, interpersonal interactions, and routines and behaviors.   IMPAIRMENTS: are limiting patient from ADLs, IADLs, rest and sleep, play, leisure, and social participation.   COMORBIDITIES: may have co-morbidities  that affects occupational performance. Patient will benefit from skilled OT to address above impairments and improve overall function.  MODIFICATION OR ASSISTANCE TO COMPLETE EVALUATION: Min-Moderate modification of tasks or assist with assess necessary to complete an evaluation.  OT OCCUPATIONAL PROFILE AND HISTORY: Detailed assessment: Review of records and additional review of physical, cognitive, psychosocial history related to current functional performance.  CLINICAL DECISION MAKING: LOW - limited treatment options, no task modification necessary  REHAB POTENTIAL:  Good  EVALUATION COMPLEXITY: Low      PLAN:  OT FREQUENCY: 2x/week  OT DURATION: 8 weeks plus eval  PLANNED INTERVENTIONS: self care/ADL training, therapeutic exercise, therapeutic activity, neuromuscular re-education, manual therapy, scar mobilization, manual lymph drainage, passive range of motion, splinting, ultrasound, paraffin, moist heat, cryotherapy, contrast bath, patient/family education, energy conservation, coping strategies training, DME and/or AE instructions, and Re-evaluation  RECOMMENDED OTHER SERVICES: n/a  CONSULTED AND AGREED WITH PLAN OF CARE: Patient  PLAN FOR NEXT SESSION: review theraband HEP, to work on coordination and strength. Stanisha Lorenz, OT 10/21/2022, 2:19 PM

## 2022-10-21 ENCOUNTER — Encounter: Payer: Self-pay | Admitting: Occupational Therapy

## 2022-10-21 ENCOUNTER — Ambulatory Visit: Payer: Medicare HMO | Attending: Physician Assistant | Admitting: Occupational Therapy

## 2022-10-21 DIAGNOSIS — M6281 Muscle weakness (generalized): Secondary | ICD-10-CM | POA: Diagnosis not present

## 2022-10-21 DIAGNOSIS — R600 Localized edema: Secondary | ICD-10-CM | POA: Diagnosis not present

## 2022-10-21 DIAGNOSIS — M79602 Pain in left arm: Secondary | ICD-10-CM

## 2022-10-21 DIAGNOSIS — R278 Other lack of coordination: Secondary | ICD-10-CM

## 2022-10-21 DIAGNOSIS — M79642 Pain in left hand: Secondary | ICD-10-CM | POA: Diagnosis not present

## 2022-10-21 DIAGNOSIS — R208 Other disturbances of skin sensation: Secondary | ICD-10-CM

## 2022-10-21 DIAGNOSIS — N2581 Secondary hyperparathyroidism of renal origin: Secondary | ICD-10-CM | POA: Diagnosis not present

## 2022-10-21 DIAGNOSIS — N186 End stage renal disease: Secondary | ICD-10-CM | POA: Diagnosis not present

## 2022-10-21 DIAGNOSIS — M25642 Stiffness of left hand, not elsewhere classified: Secondary | ICD-10-CM | POA: Diagnosis not present

## 2022-10-21 DIAGNOSIS — M25622 Stiffness of left elbow, not elsewhere classified: Secondary | ICD-10-CM | POA: Diagnosis not present

## 2022-10-21 DIAGNOSIS — M25612 Stiffness of left shoulder, not elsewhere classified: Secondary | ICD-10-CM | POA: Insufficient documentation

## 2022-10-21 DIAGNOSIS — Z992 Dependence on renal dialysis: Secondary | ICD-10-CM | POA: Diagnosis not present

## 2022-10-21 NOTE — Patient Instructions (Signed)
  Strengthening: Resisted Flexion   Hold tubing with _____ arm(s) at side. Pull forward and up. Move shoulder through pain-free range of motion. Repeat __10__ times per set.  Do _1-2_ sessions per day , every other day   Strengthening: Resisted Extension   Hold tubing in _left ____ hand(s), arm forward. Pull arm back, elbow straight. Repeat _10___ times per set. Do _1-2___ sessions per day, every other day.   Resisted Horizontal Abduction: Bilateral   Sit or stand, tubing in both hands, arms out in front. Keeping arms straight, pinch shoulder blades together and stretch arms out. Repeat _10___ times per set. Do _1-2___ sessions per day, every other day.   Elbow Flexion: Resisted   With tubing held in ___left___ hand(s) and other end secured under foot, curl arm up as far as possible. Repeat _10___ times per set. Do _1-2___ sessions per day, every other day.    Elbow Extension: Resisted   Sit in chair with resistive band secured at armrest (or hold with other hand) and _______ elbow bent. Straighten elbow. Repeat _10___ times per set.  Do _1-2___ sessions per day, every other day.   Copyright  V[employer]. All rights reserved.

## 2022-10-22 DIAGNOSIS — E1022 Type 1 diabetes mellitus with diabetic chronic kidney disease: Secondary | ICD-10-CM | POA: Diagnosis not present

## 2022-10-23 ENCOUNTER — Ambulatory Visit: Payer: Medicare HMO | Admitting: Occupational Therapy

## 2022-10-23 DIAGNOSIS — N186 End stage renal disease: Secondary | ICD-10-CM | POA: Diagnosis not present

## 2022-10-23 DIAGNOSIS — Z992 Dependence on renal dialysis: Secondary | ICD-10-CM | POA: Diagnosis not present

## 2022-10-23 DIAGNOSIS — N2581 Secondary hyperparathyroidism of renal origin: Secondary | ICD-10-CM | POA: Diagnosis not present

## 2022-10-24 DIAGNOSIS — E1022 Type 1 diabetes mellitus with diabetic chronic kidney disease: Secondary | ICD-10-CM | POA: Diagnosis not present

## 2022-10-25 DIAGNOSIS — N2581 Secondary hyperparathyroidism of renal origin: Secondary | ICD-10-CM | POA: Diagnosis not present

## 2022-10-25 DIAGNOSIS — N186 End stage renal disease: Secondary | ICD-10-CM | POA: Diagnosis not present

## 2022-10-25 DIAGNOSIS — Z992 Dependence on renal dialysis: Secondary | ICD-10-CM | POA: Diagnosis not present

## 2022-10-28 ENCOUNTER — Ambulatory Visit: Payer: Medicare HMO | Admitting: Occupational Therapy

## 2022-10-28 ENCOUNTER — Encounter: Payer: Self-pay | Admitting: Occupational Therapy

## 2022-10-28 DIAGNOSIS — M25612 Stiffness of left shoulder, not elsewhere classified: Secondary | ICD-10-CM | POA: Diagnosis not present

## 2022-10-28 DIAGNOSIS — R278 Other lack of coordination: Secondary | ICD-10-CM

## 2022-10-28 DIAGNOSIS — R208 Other disturbances of skin sensation: Secondary | ICD-10-CM

## 2022-10-28 DIAGNOSIS — N186 End stage renal disease: Secondary | ICD-10-CM | POA: Diagnosis not present

## 2022-10-28 DIAGNOSIS — M6281 Muscle weakness (generalized): Secondary | ICD-10-CM

## 2022-10-28 DIAGNOSIS — M79602 Pain in left arm: Secondary | ICD-10-CM | POA: Diagnosis not present

## 2022-10-28 DIAGNOSIS — Z992 Dependence on renal dialysis: Secondary | ICD-10-CM | POA: Diagnosis not present

## 2022-10-28 DIAGNOSIS — M79642 Pain in left hand: Secondary | ICD-10-CM

## 2022-10-28 DIAGNOSIS — M25622 Stiffness of left elbow, not elsewhere classified: Secondary | ICD-10-CM | POA: Diagnosis not present

## 2022-10-28 DIAGNOSIS — M25642 Stiffness of left hand, not elsewhere classified: Secondary | ICD-10-CM | POA: Diagnosis not present

## 2022-10-28 DIAGNOSIS — R600 Localized edema: Secondary | ICD-10-CM | POA: Diagnosis not present

## 2022-10-28 DIAGNOSIS — N2581 Secondary hyperparathyroidism of renal origin: Secondary | ICD-10-CM | POA: Diagnosis not present

## 2022-10-28 NOTE — Therapy (Signed)
OUTPATIENT OCCUPATIONAL THERAPY ORTHO Treatment  Patient Name: [name] MRN: [id number] DOB:01/05/1976, 47 y.o., female Today's Date: 10/28/2022  PCP: Dr. Julio Sicks REFERRING PROVIDER: Graceann Congress PA-C  END OF SESSION:  OT End of Session - 10/28/22 1423     Visit Number 8    Number of Visits 17    Date for OT Re-Evaluation 11/18/22    Authorization Type human[employer], MCD    Authorization Time Period 9 weeks    Authorization - Visit Number 8    Progress Note Due on Visit 10    OT Start Time 1407    OT Stop Time 1445    OT Time Calculation (min) 38 min    Activity Tolerance Patient tolerated treatment well    Behavior During Therapy WFL for tasks assessed/performed                   Past Medical History:  Diagnosis Date   Anemia    Arthritis    Ascites 03/28/2020   Bacteremia 02/11/2020   Cerebral infarction due to unspecified mechanism 05/22/2014   CHF (congestive heart failure)    Diabetes mellitus    Diabetic retinopathy    Hx of laser Rx's   DM type 1 (diabetes mellitus, type 1) 09/12/2014   Age of onset for DM type 1 was age 55.      Enlarged thyroid gland    ESRD on hemodialysis    ESRD due to DM type I, age of onset DM 1 was age 28.  Went on dialysis in March 2012.  Gets HD now at [facility] on a TTS schedule.     ESRD on hemodialysis 09/12/2014   ESRD due to DM type 1.  Started HD in 2012 at [facility].  Gets HD there on a TTS schedule.     GERD (gastroesophageal reflux disease)    HCAP (healthcare-associated pneumonia) 09/11/2014   Hemorrhage from arteriovenous dialysis graft 10/20/2019   History of blood transfusion    Hyperlipidemia    Hypertension    Hypertensive emergency 05/22/2014   Peripheral vascular disease    Pneumonia    Past Surgical History:  Procedure Laterality Date   A/V FISTULAGRAM Right 10/31/2020   Procedure: A/V FISTULAGRAM;  Surgeon: Leonie Douglas, MD;  Location: [facility];  Service: Cardiovascular;   Laterality: Right;   ARTERIOVENOUS GRAFT PLACEMENT     ARTERY REPAIR Left 12/10/2012   Procedure: BRACHIAL ARTERY REPAIR;  Surgeon: Chuck Hint, MD;  Location: [facility] OR;  Service: Vascular;  Laterality: Left;  Exploration Left Brachial Artery for AVF   AV FISTULA PLACEMENT Right 01/11/2018   Procedure: INSERTION OF ARTERIOVENOUS (AV) GORE-TEX GRAFT  UPPER ARM;  Surgeon: Chuck Hint, MD;  Location: [facility] OR;  Service: Vascular;  Laterality: Right;   AVGG REMOVAL Left 08/12/2022   Procedure: LEFT ARM ARTERIOVENOUS GRAFT EXCISION WITH VEIN PATCH ANGIOPLASTY;  Surgeon: Maeola Harman, MD;  Location: [facility] OR;  Service: Vascular;  Laterality: Left;   BASCILIC VEIN TRANSPOSITION Right 09/28/2017   Procedure: FIRST STAGE BASILIC VEIN TRANSPOSITION;  Surgeon: Chuck Hint, MD;  Location: [facility] OR;  Service: Vascular;  Laterality: Right;   CESAREAN SECTION     CHEST TUBE INSERTION Right 06/01/2020   Procedure: CHEST TUBE INSERTION;  Surgeon: Josephine Igo, DO;  Location: [facility]OS[facility];  Service: Pulmonary;  Laterality: Right;   CHEST TUBE INSERTION Right 07/04/2020   Procedure: INSERTION PLEURAL DRAINAGE CATHETER;  Surgeon: Loreli Slot,  MD;  Location: [facility] OR;  Service: Thoracic;  Laterality: Right;   EYE SURGERY     LAzer   EYE SURGERY Left    IR THORACENTESIS ASP PLEURAL SPACE W/IMG GUIDE  05/31/2020   IR THORACENTESIS ASP PLEURAL SPACE W/IMG GUIDE  06/22/2020   REVISION OF ARTERIOVENOUS GORETEX GRAFT Left 08/01/2016   Procedure: REVISION OF ARTERIOVENOUS GORETEX GRAFT;  Surgeon: Chuck Hinthristopher S Dickson, MD;  [facility];  Service: Vascular;  Laterality: Left;   REVISION OF ARTERIOVENOUS GORETEX GRAFT Right 10/21/2019   Procedure: REVISION OF ARTERIOVENOUS GORETEX GRAFT ARM;  Surgeon: Larina EarthlyEarly, Todd F, MD;  Location: [facility] OR;  Service: Vascular;  Laterality: Right;   RIGHT HEART CATH N/A 08/01/2020   Procedure: RIGHT HEART CATH;  Surgeon: Dolores PattyBensimhon, Daniel R, MD;   Location: [facility];  Service: Cardiovascular;  Laterality: N/A;   SHUNTOGRAM Left 11/30/2013   Procedure: Betsey AmenSHUNTOGRAM;  Surgeon: Nada LibmanVance W Brabham, MD;  [facility];  Service: Cardiovascular;  Laterality: Left;   TEE WITHOUT CARDIOVERSION N/A 02/13/2020   Procedure: TRANSESOPHAGEAL ECHOCARDIOGRAM (TEE);  Surgeon: Wendall StadeNishan, Peter C, MD;  [facility];  Service: Cardiovascular;  Laterality: N/A;   TEE WITHOUT CARDIOVERSION N/A 08/15/2022   Procedure: TRANSESOPHAGEAL ECHOCARDIOGRAM (TEE);  Surgeon: Maisie FusBranch, Mary E, MD;  [facility];  Service: Cardiovascular;  Laterality: N/A;   Patient Active Problem List   Diagnosis Date Noted   Infection of AV graft for dialysis 08/11/2022   Staphylococcus aureus bacteremia 08/08/2022   Pneumonia, staphylococcal 08/08/2022   Visit for wound check 07/26/2020   Pleural effusion    Difficulty breathing 05/30/2020   Exudative pleural effusion    Gram-positive bacteremia 02/11/2020   History of CVA (cerebrovascular accident) 03/11/2017   Hyperparathyroidism, secondary renal    ESRD on hemodialysis 09/12/2014   Diabetes mellitus type I 09/12/2014   History of diabetic retinopathy 09/12/2014   Cerebral infarction due to unspecified mechanism 05/22/2014   Essential hypertension, benign 05/22/2014   DM (diabetes mellitus) 05/22/2014   HLD (hyperlipidemia) 05/22/2014   Cerebral infarction 03/29/2014   Facial droop 03/29/2014   Mechanical complication of other vascular device, implant, and graft 01/12/2013   Anxiety disorder, unspecified 05/11/2012    ONSET DATE: 09/10/22-referral date  REFERRING DIAG:    R29.898 (ICD-10-CM) - Left hand weakness    THERAPY DIAG:  Pain in left hand  Pain in left arm  Stiffness of left hand, not elsewhere classified  Stiffness of left shoulder, not elsewhere classified  Stiffness of left elbow, not elsewhere classified  Other disturbances of skin sensation  Muscle weakness  (generalized)  Other lack of coordination  Rationale for Evaluation and Treatment: Rehabilitation  SUBJECTIVE:   SUBJECTIVE STATEMENT:  Pt reports today is a good day Pt accompanied by: self  PERTINENT HISTORY:  s/p excision of left arm axillary AV loop graft, harvest of left basilic vein and vein angioplasty of left axillary artery by Dr. Randie Heinzain on 08/12/22. This was performed secondary to nonfunctioning AV graft that was infected due to MSSA bacteremia. She is on IV abx at dialysis 3x/ week through 09/23/22. Patent is having some numbness, coldness and pain in her left arm and hand. PMH ESRD on HD, HTN, HLD, anxiety, DM II, CVA   PRECAUTIONS: Other: dialysis , AV graft in RUE, on abx for infection  WEIGHT BEARING RESTRICTIONS: No  PAIN:  no  FALLS: Has patient fallen in last 6 months? No  LIVING ENVIRONMENT: Lives with: lives with their family Lives  in: Other town house  PLOF: Independent  PATIENT GOALS:  to be able to use LUE better  NEXT MD VISIT: unknown  OBJECTIVE:   HAND DOMINANCE: Right  ADLs: Overall ADLs: increased time required, difficulty using LUE Transfers/ambulation related to ADLs: Eating: pt is not able to cut normally. Grooming: needs assist with styling hair UB Dressing: mod I with pullover shirt, needs help with buttons LB Dressing:  needs assist with tying  Toileting: mod I Bathing: mod I with sponge bath, increased time Tub Shower transfers: currently sponge bathing due to wound   FUNCTIONAL OUTCOME MEASURES: Quick Dash: 61.3%- 09/30/22   UPPER EXTREMITY ROM:   RUE A/ROM WFLS.  Active ROM Right eval Left eval  Shoulder flexion  120  Shoulder abduction  80  Shoulder adduction    Shoulder extension    Shoulder internal rotation    Shoulder external rotation    Elbow flexion  130  Elbow extension  -25  Wrist flexion  55  Wrist extension  40  Wrist ulnar deviation    Wrist radial deviation    Wrist pronation  WFL  Wrist supination   90%   Left Composite finger flexion 50%, extension 90%, able to oppose digit 1 with Thumb   HAND FUNCTION: Grip strength: Right: 5 lbs; Left: 45 lbs  COORDINATION: 9 Hole Peg test: Right: 26.30 sec; Left: 8 placed in 2 mins  10/08/22- LUE 9 hole peg tesT:9 pegs placed and removed in 1 min 57 secs   SENSATION: Light touch: Impaired  Hot/Cold: Impaired   EDEMA: moderate in LUE   COGNITION: Overall cognitive status: Within functional limits for tasks assessed A OBSERVATIONS: Pt demonstrates hypersensitivity in LUE with guarding   TODAY'S TREATMENT:                                                                                                                              DATE: 10/28/22-Reviewed red putty exercises for sustained grip and pinch as well as yellow theraband exercises, min v.c and demonstration. Attempted placing grooved pegs however it was too challenging so task was changed to regular small pegs, Mod difficulty and v.c for placing, min difficulty removing pegs from pegboard. Therapist initiated fabrication of a finger splint for improved finger alignment however she did not issue as it will require adjustments.     10/21/22- BP 132/72 Tendon gliding exercises Red putty issued for sustained grip and pinch, min v.c  Cane exercises for shoulder flexion and chest press, min v.c Tying shoe with leg crossed across knee with improved performance. Pt was instructed to consider using finger splint while tying shoe. Pt left at home. Yellow theraband exercises 10 reps each, min v.c and demonstration. See pt. instructions   10/14/22- BP: 141/ 80, Pt arrived a little bit late stating her sugar was high but it is coming back up, pt brought peanut butter crackers with her and she was provided with a bottled water. Tendon  gliding exercises followed by review of yellow putty exercises for sustained grip and pinch, min v.c Placing small pegs into pegboard, with LUE, grossly 5 pegs  mod-max difficulty, then removing with LUE Placing and removing yellow and red clothespins for sustained pinch Reviewed coordination HEP and progressed to stacking pennies instead of quarters. Pt has difficulty due to index finger DIP joint is laterally flexed . Small DIP ring splint was fabricated to place DIP joint into better alignment. Pt was instructed in splint wear and care, and not to wear when cooking, cutting or holding breakable items.  10/09/22-Therapist checked 9 hole peg test, see below Tendon gliding exercises. Placing large pegs into pegboard with LUE, min verbal and tactile cues for 3 pt pinch, min difficulty and increased time Pt reports her BP was high at dialysis. (BP 181/101. ) It was checked and therapy was discontinued as a result of elevated BP. Pt is asymptomatic for CVA. She was educated in CVA warning signs and symptoms. Pt was instructed to go home and take it easy. She plans to have her dtr purchase a BP cuff.          PATIENT EDUCATION: Education details: reviewed red putty HEP, theraband - yellow HEP Person educated: Patient Education method: Explanation, Demonstration, Verbal cues, and Handouts Education comprehension: verbalized understanding, returned demonstration, and verbal cues required   HOME EXERCISE PROGRAM: 09/16/22- tendon gliding  Putty issued GOALS: Goals reviewed with patient? Yes potential goals discussed  SHORT TERM GOALS: Target date: 10/14/22  I with initial HEP. Baseline:dependent Goal status:met 10/07/22  2.  Pt will increase LUE grip strength to 10 lbs or greater for increased functional use. Baseline: Grip strength: Right: 5 lbs; Left: 45 lbs Goal status: met 18 lbs 10/07/22  3.  Pt will demonstrate improved LUE fine motor as evidenced by decreasing 9 hole peg test to completing test in 2 mins or less. Baseline: 8 pegs placed in 2 mins with LUE Goal status: met , completed in 1 min 57- 10/09/22  4.  Pt will consistently use  LUE as a gross assist at least 25% of the time with pain less than or equal to 4/10. Baseline: uses LUE less than 10%, pain 5/10 Goal status: met, uses 50% of the time, no reports of pain today.  5.  Pt will demonstrate ability to retrieve a lightweight object at 125 shoulder flexion and -20 elbow extension without drops using LUE. Baseline: LUE shoulder flexion 120, elbow extension -25 Goal status: met,130, -15  6.  Pt will perform all basic ADLS mod I. Baseline: Pt requires assist with tying shoes, buttons, styling hair Goal status: met, pt was able to tie shoes today, pt reports styling her own hair her own hair and fastening buttons  LONG TERM GOALS: Target date: 11/18/22  I with updated HEP Baseline: dependent Goal status:ongoing, red putty issued 10/21/22  2.  Pt will increase LUE grip strength to 25 lbs or greater for increased functional use. Baseline: Grip strength: Right: 5 lbs; Left: 45 lbs Goal status ongoing : 18 lbs  3.  Pt will demonstrate improved LUE fine motor for ADLS as evidenced by decreasing 9 hole peg test to completing test in 1 min 45 secs. Baseline: 8 pegs placed in 2 mins with LUE Goal status: ongoing  4.  Pt will consistently use LUE as a gross assist at least 40% of the time with pain less than or equal to 4/10. Baseline: uses LUE less than 10%, pain 5/10  Goal status:  met uses 50% of the time, pain, no pin   5.  Pt. will improve Quick Dash score to 55% disability or less. Baseline: 61.3% disability 09/30/22 Goal status: ongoing  6.  Pt will resume performance of light home management and cooking at a mod I level. Baseline: dependent Goal status: ongoing  ASSESSMENT:  CLINICAL IMPRESSION: Patient is progressing towards goals with improved LUE function. She demonstrates improved LUE strength, however she remains limited for fine motor coordination.  PERFORMANCE DEFICITS: in functional skills including ADLs, IADLs, coordination, dexterity, sensation,  edema, ROM, strength, pain, flexibility, Fine motor control, Gross motor control, decreased knowledge of precautions, decreased knowledge of use of DME, wound, skin integrity, and UE functional use, psychosocial skills including coping strategies, environmental adaptation, habits, interpersonal interactions, and routines and behaviors.   IMPAIRMENTS: are limiting patient from ADLs, IADLs, rest and sleep, play, leisure, and social participation.   COMORBIDITIES: may have co-morbidities  that affects occupational performance. Patient will benefit from skilled OT to address above impairments and improve overall function.  MODIFICATION OR ASSISTANCE TO COMPLETE EVALUATION: Min-Moderate modification of tasks or assist with assess necessary to complete an evaluation.  OT OCCUPATIONAL PROFILE AND HISTORY: Detailed assessment: Review of records and additional review of physical, cognitive, psychosocial history related to current functional performance.  CLINICAL DECISION MAKING: LOW - limited treatment options, no task modification necessary  REHAB POTENTIAL: Good  EVALUATION COMPLEXITY: Low      PLAN:  OT FREQUENCY: 2x/week  OT DURATION: 8 weeks plus eval  PLANNED INTERVENTIONS: self care/ADL training, therapeutic exercise, therapeutic activity, neuromuscular re-education, manual therapy, scar mobilization, manual lymph drainage, passive range of motion, splinting, ultrasound, paraffin, moist heat, cryotherapy, contrast bath, patient/family education, energy conservation, coping strategies training, DME and/or AE instructions, and Re-evaluation  RECOMMENDED OTHER SERVICES: n/a  CONSULTED AND AGREED WITH PLAN OF CARE: Patient  PLAN FOR NEXT SESSION: continue to work on coordination and strength. Tarsha Blando, OT 10/28/2022, 2:33 PM

## 2022-10-30 DIAGNOSIS — Z992 Dependence on renal dialysis: Secondary | ICD-10-CM | POA: Diagnosis not present

## 2022-10-30 DIAGNOSIS — N2581 Secondary hyperparathyroidism of renal origin: Secondary | ICD-10-CM | POA: Diagnosis not present

## 2022-10-30 DIAGNOSIS — N186 End stage renal disease: Secondary | ICD-10-CM | POA: Diagnosis not present

## 2022-11-01 DIAGNOSIS — N2581 Secondary hyperparathyroidism of renal origin: Secondary | ICD-10-CM | POA: Diagnosis not present

## 2022-11-01 DIAGNOSIS — Z992 Dependence on renal dialysis: Secondary | ICD-10-CM | POA: Diagnosis not present

## 2022-11-01 DIAGNOSIS — N186 End stage renal disease: Secondary | ICD-10-CM | POA: Diagnosis not present

## 2022-11-04 ENCOUNTER — Ambulatory Visit: Payer: Medicare HMO | Admitting: Occupational Therapy

## 2022-11-04 DIAGNOSIS — M79642 Pain in left hand: Secondary | ICD-10-CM | POA: Diagnosis not present

## 2022-11-04 DIAGNOSIS — R208 Other disturbances of skin sensation: Secondary | ICD-10-CM

## 2022-11-04 DIAGNOSIS — M25612 Stiffness of left shoulder, not elsewhere classified: Secondary | ICD-10-CM | POA: Diagnosis not present

## 2022-11-04 DIAGNOSIS — R600 Localized edema: Secondary | ICD-10-CM

## 2022-11-04 DIAGNOSIS — M79602 Pain in left arm: Secondary | ICD-10-CM | POA: Diagnosis not present

## 2022-11-04 DIAGNOSIS — M6281 Muscle weakness (generalized): Secondary | ICD-10-CM | POA: Diagnosis not present

## 2022-11-04 DIAGNOSIS — M25622 Stiffness of left elbow, not elsewhere classified: Secondary | ICD-10-CM

## 2022-11-04 DIAGNOSIS — N2581 Secondary hyperparathyroidism of renal origin: Secondary | ICD-10-CM | POA: Diagnosis not present

## 2022-11-04 DIAGNOSIS — N186 End stage renal disease: Secondary | ICD-10-CM | POA: Diagnosis not present

## 2022-11-04 DIAGNOSIS — M25642 Stiffness of left hand, not elsewhere classified: Secondary | ICD-10-CM

## 2022-11-04 DIAGNOSIS — R278 Other lack of coordination: Secondary | ICD-10-CM

## 2022-11-04 DIAGNOSIS — Z992 Dependence on renal dialysis: Secondary | ICD-10-CM | POA: Diagnosis not present

## 2022-11-04 NOTE — Therapy (Signed)
OUTPATIENT OCCUPATIONAL THERAPY ORTHO Treatment  Patient Name: [name] MRN: [id number] DOB:1976/03/13, 47 y.o., female Today's Date: 11/04/2022  PCP: Dr. Julio Sicks REFERRING PROVIDER: Graceann Congress PA-C  END OF SESSION:  OT End of Session - 11/04/22 1414     Visit Number 9    Number of Visits 17    Date for OT Re-Evaluation 11/18/22    Authorization Type humana, MCD    Authorization Time Period 9 weeks    Authorization - Visit Number 8    Progress Note Due on Visit 10    OT Start Time 1403    OT Stop Time 1445    OT Time Calculation (min) 42 min                    Past Medical History:  Diagnosis Date   Anemia    Arthritis    Ascites 03/28/2020   Bacteremia 02/11/2020   Cerebral infarction due to unspecified mechanism 05/22/2014   CHF (congestive heart failure)    Diabetes mellitus    Diabetic retinopathy    Hx of laser Rx's   DM type 1 (diabetes mellitus, type 1) 09/12/2014   Age of onset for DM type 1 was age 32.      Enlarged thyroid gland    ESRD on hemodialysis    ESRD due to DM type I, age of onset DM 1 was age 43.  Went on dialysis in March 2012.  Gets HD now at [facility] on a TTS schedule.     ESRD on hemodialysis 09/12/2014   ESRD due to DM type 1.  Started HD in 2012 at [facility].  Gets HD there on a TTS schedule.     GERD (gastroesophageal reflux disease)    HCAP (healthcare-associated pneumonia) 09/11/2014   Hemorrhage from arteriovenous dialysis graft 10/20/2019   History of blood transfusion    Hyperlipidemia    Hypertension    Hypertensive emergency 05/22/2014   Peripheral vascular disease    Pneumonia    Past Surgical History:  Procedure Laterality Date   A/V FISTULAGRAM Right 10/31/2020   Procedure: A/V FISTULAGRAM;  Surgeon: Leonie Douglas, MD;  Location: [facility];  Service: Cardiovascular;  Laterality: Right;   ARTERIOVENOUS GRAFT PLACEMENT     ARTERY REPAIR Left 12/10/2012   Procedure: BRACHIAL ARTERY  REPAIR;  Surgeon: Chuck Hint, MD;  Location: [facility] OR;  Service: Vascular;  Laterality: Left;  Exploration Left Brachial Artery for AVF   AV FISTULA PLACEMENT Right 01/11/2018   Procedure: INSERTION OF ARTERIOVENOUS (AV) GORE-TEX GRAFT  UPPER ARM;  Surgeon: Chuck Hint, MD;  Location: [facility] OR;  Service: Vascular;  Laterality: Right;   AVGG REMOVAL Left 08/12/2022   Procedure: LEFT ARM ARTERIOVENOUS GRAFT EXCISION WITH VEIN PATCH ANGIOPLASTY;  Surgeon: Maeola Harman, MD;  Location: [facility] OR;  Service: Vascular;  Laterality: Left;   BASCILIC VEIN TRANSPOSITION Right 09/28/2017   Procedure: FIRST STAGE BASILIC VEIN TRANSPOSITION;  Surgeon: Chuck Hint, MD;  Location: [facility] OR;  Service: Vascular;  Laterality: Right;   CESAREAN SECTION     CHEST TUBE INSERTION Right 06/01/2020   Procedure: CHEST TUBE INSERTION;  Surgeon: Josephine Igo, DO;  Location: [facility] ENDOSCOPY;  Service: Pulmonary;  Laterality: Right;   CHEST TUBE INSERTION Right 07/04/2020   Procedure: INSERTION PLEURAL DRAINAGE CATHETER;  Surgeon: Loreli Slot, MD;  Location: [facility] OR;  Service: Thoracic;  Laterality: Right;   EYE SURGERY  LAzer   EYE SURGERY Left    IR THORACENTESIS ASP PLEURAL SPACE W/IMG GUIDE  05/31/2020   IR THORACENTESIS ASP PLEURAL SPACE W/IMG GUIDE  06/22/2020   REVISION OF ARTERIOVENOUS GORETEX GRAFT Left 08/01/2016   Procedure: REVISION OF ARTERIOVENOUS GORETEX GRAFT;  Surgeon: Chuck Hint, MD;  Location: [facility] OR;  Service: Vascular;  Laterality: Left;   REVISION OF ARTERIOVENOUS GORETEX GRAFT Right 10/21/2019   Procedure: REVISION OF ARTERIOVENOUS GORETEX GRAFT ARM;  Surgeon: Larina Earthly, MD;  Location: [facility] OR;  Service: Vascular;  Laterality: Right;   RIGHT HEART CATH N/A 08/01/2020   Procedure: RIGHT HEART CATH;  Surgeon: Dolores Patty, MD;  Location: [facility];  Service: Cardiovascular;  Laterality: N/A;   SHUNTOGRAM Left 11/30/2013   Procedure:  Betsey Amen;  Surgeon: Nada Libman, MD;  Location: [facility] CATH LAB;  Service: Cardiovascular;  Laterality: Left;   TEE WITHOUT CARDIOVERSION N/A 02/13/2020   Procedure: TRANSESOPHAGEAL ECHOCARDIOGRAM (TEE);  Surgeon: Wendall Stade, MD;  Location: [facility] ENDOSCOPY;  Service: Cardiovascular;  Laterality: N/A;   TEE WITHOUT CARDIOVERSION N/A 08/15/2022   Procedure: TRANSESOPHAGEAL ECHOCARDIOGRAM (TEE);  Surgeon: Maisie Fus, MD;  Location: [facility] ENDOSCOPY;  Service: Cardiovascular;  Laterality: N/A;   Patient Active Problem List   Diagnosis Date Noted   Infection of AV graft for dialysis 08/11/2022   Staphylococcus aureus bacteremia 08/08/2022   Pneumonia, staphylococcal 08/08/2022   Visit for wound check 07/26/2020   Pleural effusion    Difficulty breathing 05/30/2020   Exudative pleural effusion    Gram-positive bacteremia 02/11/2020   History of CVA (cerebrovascular accident) 03/11/2017   Hyperparathyroidism, secondary renal    ESRD on hemodialysis 09/12/2014   Diabetes mellitus type I 09/12/2014   History of diabetic retinopathy 09/12/2014   Cerebral infarction due to unspecified mechanism 05/22/2014   Essential hypertension, benign 05/22/2014   DM (diabetes mellitus) 05/22/2014   HLD (hyperlipidemia) 05/22/2014   Cerebral infarction 03/29/2014   Facial droop 03/29/2014   Mechanical complication of other vascular device, implant, and graft 01/12/2013   Anxiety disorder, unspecified 05/11/2012    ONSET DATE: 09/10/22-referral date  REFERRING DIAG:    R29.898 (ICD-10-CM) - Left hand weakness    THERAPY DIAG:  Pain in left hand  Pain in left arm  Stiffness of left shoulder, not elsewhere classified  Stiffness of left elbow, not elsewhere classified  Stiffness of left hand, not elsewhere classified  Other disturbances of skin sensation  Muscle weakness (generalized)  Other lack of coordination  Localized edema  Rationale for Evaluation and Treatment:  Rehabilitation  SUBJECTIVE:   SUBJECTIVE STATEMENT:  Pt reports difficulty pumping lotion Pt accompanied by: self  PERTINENT HISTORY:  s/p excision of left arm axillary AV loop graft, harvest of left basilic vein and vein angioplasty of left axillary artery by Dr. Randie Heinz on 08/12/22. This was performed secondary to nonfunctioning AV graft that was infected due to MSSA bacteremia. She is on IV abx at dialysis 3x/ week through 09/23/22. Patent is having some numbness, coldness and pain in her left arm and hand. PMH ESRD on HD, HTN, HLD, anxiety, DM II, CVA   PRECAUTIONS: Other: dialysis , AV graft in RUE, on abx for infection  WEIGHT BEARING RESTRICTIONS: No  PAIN:  no  FALLS: Has patient fallen in last 6 months? No  LIVING ENVIRONMENT: Lives with: lives with their family Lives in: Other town house  PLOF: Independent  PATIENT GOALS:  to be able to use LUE better  NEXT MD VISIT: unknown  OBJECTIVE:   HAND DOMINANCE: Right  ADLs: Overall ADLs: increased time required, difficulty using LUE Transfers/ambulation related to ADLs: Eating: pt is not able to cut normally. Grooming: needs assist with styling hair UB Dressing: mod I with pullover shirt, needs help with buttons LB Dressing:  needs assist with tying  Toileting: mod I Bathing: mod I with sponge bath, increased time Tub Shower transfers: currently sponge bathing due to wound   FUNCTIONAL OUTCOME MEASURES: Quick Dash: 61.3%- 09/30/22   UPPER EXTREMITY ROM:   RUE A/ROM WFLS.  Active ROM Right eval Left eval  Shoulder flexion  120  Shoulder abduction  80  Shoulder adduction    Shoulder extension    Shoulder internal rotation    Shoulder external rotation    Elbow flexion  130  Elbow extension  -25  Wrist flexion  55  Wrist extension  40  Wrist ulnar deviation    Wrist radial deviation    Wrist pronation  WFL  Wrist supination  90%   Left Composite finger flexion 50%, extension 90%, able to oppose digit 1  with Thumb   HAND FUNCTION: Grip strength: Right: 5 lbs; Left: 45 lbs  COORDINATION: 9 Hole Peg test: Right: 26.30 sec; Left: 8 placed in 2 mins  10/08/22- LUE 9 hole peg tesT:9 pegs placed and removed in 1 min 57 secs   SENSATION: Light touch: Impaired  Hot/Cold: Impaired   EDEMA: moderate in LUE   COGNITION: Overall cognitive status: Within functional limits for tasks assessed A OBSERVATIONS: Pt demonstrates hypersensitivity in LUE with guarding   TODAY'S TREATMENT:                                                                                                                              DATE:11/04/22 Pt was shown oval 8 splint for improved positioning of left DIP joint deformity, size 6 oval 8 improves positioning as DIP is laterally flexed. Pt returned demonstration of application and she was shown where to purchase. Pt wore splint as a trial during activities in therapy. Towel scrunching and flattening x 10 reps Placing and removing graded clothespins from target for sustained pinch. Pt demonstrates max difficulty with black clothespins, 8 lbs of pinch, oval 8 splint used. Reviewed yellow theraband exercises previously issued, 10 reps each for bilateral and LUE strength, min v.c , Attempted  red band however it was too challenging. Pinching yellow putty into a mound for finger strength then flattening min v.c Placing small pegs into pegboard with LUE, min-mod difficulty and increased time.      10/28/22-Reviewed red putty exercises for sustained grip and pinch as well as yellow theraband exercises, min v.c and demonstration. Attempted placing grooved pegs however it was too challenging so task was changed to regular small pegs, Mod difficulty and v.c for placing, min difficulty removing pegs from pegboard. Therapist initiated fabrication of a finger splint for improved finger alignment however she  did not issue as it will require adjustments.     10/21/22- BP 132/72 Tendon  gliding exercises Red putty issued for sustained grip and pinch, min v.c  Cane exercises for shoulder flexion and chest press, min v.c Tying shoe with leg crossed across knee with improved performance. Pt was instructed to consider using finger splint while tying shoe. Pt left at home. Yellow theraband exercises 10 reps each, min v.c and demonstration. See pt. instructions    PATIENT EDUCATION: Education details: , theraband - yellow HEP Person educated: Patient Education method: Explanation, Demonstration, Verbal cues,  Education comprehension: verbalized understanding, returned demonstration, and verbal cues required   HOME EXERCISE PROGRAM: 09/16/22- tendon gliding  Putty issued GOALS: Goals reviewed with patient? Yes potential goals discussed  SHORT TERM GOALS: Target date: 10/14/22  I with initial HEP. Baseline:dependent Goal status:met 10/07/22  2.  Pt will increase LUE grip strength to 10 lbs or greater for increased functional use. Baseline: Grip strength: Right: 5 lbs; Left: 45 lbs Goal status: met 18 lbs 10/07/22  3.  Pt will demonstrate improved LUE fine motor as evidenced by decreasing 9 hole peg test to completing test in 2 mins or less. Baseline: 8 pegs placed in 2 mins with LUE Goal status: met , completed in 1 min 57- 10/09/22  4.  Pt will consistently use LUE as a gross assist at least 25% of the time with pain less than or equal to 4/10. Baseline: uses LUE less than 10%, pain 5/10 Goal status: met, uses 50% of the time, no reports of pain today.  5.  Pt will demonstrate ability to retrieve a lightweight object at 125 shoulder flexion and -20 elbow extension without drops using LUE. Baseline: LUE shoulder flexion 120, elbow extension -25 Goal status: met,130, -15  6.  Pt will perform all basic ADLS mod I. Baseline: Pt requires assist with tying shoes, buttons, styling hair Goal status: met, pt was able to tie shoes today, pt reports styling her own hair her  own hair and fastening buttons  LONG TERM GOALS: Target date: 11/18/22  I with updated HEP Baseline: dependent Goal status:ongoing, red putty issued 10/21/22  2.  Pt will increase LUE grip strength to 25 lbs or greater for increased functional use. Baseline: Grip strength: Right: 5 lbs; Left: 45 lbs Goal status ongoing : 18 lbs  3.  Pt will demonstrate improved LUE fine motor for ADLS as evidenced by decreasing 9 hole peg test to completing test in 1 min 45 secs. Baseline: 8 pegs placed in 2 mins with LUE Goal status: ongoing  4.  Pt will consistently use LUE as a gross assist at least 40% of the time with pain less than or equal to 4/10. Baseline: uses LUE less than 10%, pain 5/10 Goal status:  met uses 50% of the time, pain, no pin   5.  Pt. will improve Quick Dash score to 55% disability or less. Baseline: 61.3% disability 09/30/22 Goal status: ongoing  6.  Pt will resume performance of light home management and cooking at a mod I level. Baseline: dependent Goal status: ongoing  ASSESSMENT:  CLINICAL IMPRESSION: Patient is progressing towards goals with continued improvements in LUE function. Pt can benefit from continued skilled occupational therapy to address LUe strength and coordination. Pt was only able to get scheduled 1x this week due to dialysis schedule.  PERFORMANCE DEFICITS: in functional skills including ADLs, IADLs, coordination, dexterity, sensation, edema, ROM, strength, pain, flexibility, Fine motor control, Gross  motor control, decreased knowledge of precautions, decreased knowledge of use of DME, wound, skin integrity, and UE functional use, psychosocial skills including coping strategies, environmental adaptation, habits, interpersonal interactions, and routines and behaviors.   IMPAIRMENTS: are limiting patient from ADLs, IADLs, rest and sleep, play, leisure, and social participation.   COMORBIDITIES: may have co-morbidities  that affects occupational  performance. Patient will benefit from skilled OT to address above impairments and improve overall function.  MODIFICATION OR ASSISTANCE TO COMPLETE EVALUATION: Min-Moderate modification of tasks or assist with assess necessary to complete an evaluation.  OT OCCUPATIONAL PROFILE AND HISTORY: Detailed assessment: Review of records and additional review of physical, cognitive, psychosocial history related to current functional performance.  CLINICAL DECISION MAKING: LOW - limited treatment options, no task modification necessary  REHAB POTENTIAL: Good  EVALUATION COMPLEXITY: Low      PLAN:  OT FREQUENCY: 2x/week  OT DURATION: 8 weeks plus eval  PLANNED INTERVENTIONS: self care/ADL training, therapeutic exercise, therapeutic activity, neuromuscular re-education, manual therapy, scar mobilization, manual lymph drainage, passive range of motion, splinting, ultrasound, paraffin, moist heat, cryotherapy, contrast bath, patient/family education, energy conservation, coping strategies training, DME and/or AE instructions, and Re-evaluation  RECOMMENDED OTHER SERVICES: n/a  CONSULTED AND AGREED WITH PLAN OF CARE: Patient  PLAN FOR NEXT SESSION: continue to work on coordination and strength. Cadey Bazile, OT 11/04/2022, 2:19 PM

## 2022-11-04 NOTE — Patient Instructions (Signed)
Oval 8 splint- If you purchase an oval 8 splint size 6, start out wearing it for 1 hour at a time.  Gradually increase wear during waking hours. Do not sleep in splint. Remove splint if pain or pressure areas or it is getting in your way.

## 2022-11-06 ENCOUNTER — Ambulatory Visit: Payer: Medicare HMO | Admitting: Occupational Therapy

## 2022-11-06 DIAGNOSIS — N2581 Secondary hyperparathyroidism of renal origin: Secondary | ICD-10-CM | POA: Diagnosis not present

## 2022-11-06 DIAGNOSIS — Z992 Dependence on renal dialysis: Secondary | ICD-10-CM | POA: Diagnosis not present

## 2022-11-06 DIAGNOSIS — N186 End stage renal disease: Secondary | ICD-10-CM | POA: Diagnosis not present

## 2022-11-08 DIAGNOSIS — N2581 Secondary hyperparathyroidism of renal origin: Secondary | ICD-10-CM | POA: Diagnosis not present

## 2022-11-08 DIAGNOSIS — Z992 Dependence on renal dialysis: Secondary | ICD-10-CM | POA: Diagnosis not present

## 2022-11-08 DIAGNOSIS — N186 End stage renal disease: Secondary | ICD-10-CM | POA: Diagnosis not present

## 2022-11-09 DIAGNOSIS — E1022 Type 1 diabetes mellitus with diabetic chronic kidney disease: Secondary | ICD-10-CM | POA: Diagnosis not present

## 2022-11-11 ENCOUNTER — Ambulatory Visit: Payer: Medicare HMO | Admitting: Occupational Therapy

## 2022-11-11 DIAGNOSIS — M79642 Pain in left hand: Secondary | ICD-10-CM

## 2022-11-11 DIAGNOSIS — M25642 Stiffness of left hand, not elsewhere classified: Secondary | ICD-10-CM

## 2022-11-11 DIAGNOSIS — M79602 Pain in left arm: Secondary | ICD-10-CM | POA: Diagnosis not present

## 2022-11-11 DIAGNOSIS — R208 Other disturbances of skin sensation: Secondary | ICD-10-CM

## 2022-11-11 DIAGNOSIS — M25612 Stiffness of left shoulder, not elsewhere classified: Secondary | ICD-10-CM | POA: Diagnosis not present

## 2022-11-11 DIAGNOSIS — R278 Other lack of coordination: Secondary | ICD-10-CM | POA: Diagnosis not present

## 2022-11-11 DIAGNOSIS — N2581 Secondary hyperparathyroidism of renal origin: Secondary | ICD-10-CM | POA: Diagnosis not present

## 2022-11-11 DIAGNOSIS — M6281 Muscle weakness (generalized): Secondary | ICD-10-CM

## 2022-11-11 DIAGNOSIS — M25622 Stiffness of left elbow, not elsewhere classified: Secondary | ICD-10-CM

## 2022-11-11 DIAGNOSIS — R600 Localized edema: Secondary | ICD-10-CM | POA: Diagnosis not present

## 2022-11-11 DIAGNOSIS — N186 End stage renal disease: Secondary | ICD-10-CM | POA: Diagnosis not present

## 2022-11-11 DIAGNOSIS — Z992 Dependence on renal dialysis: Secondary | ICD-10-CM | POA: Diagnosis not present

## 2022-11-11 NOTE — Therapy (Addendum)
OUTPATIENT OCCUPATIONAL THERAPY ORTHO Treatment  Patient Name: [name] MRN: [id number] DOB:[birth year], 47 y.o., female Today's Date: 11/11/2022  PCP: Dr. Julio Sicks REFERRING PROVIDER: Graceann Congress PA-C  OCCUPATIONAL THERAPY DISCHARGE SUMMARY    Current functional level related to goals / functional outcomes: Pt made excellent progress and met all goals.   Remaining deficits: Mildly decreased strength, coordination and ROM   Education / Equipment: Pt was instructed in HEP and oval 8 splint wear. Pt demonstrates understanding.   Patient agrees to discharge. Patient goals were met. Patient is being discharged due to meeting the stated rehab goals..    END OF SESSION:  OT End of Session - 11/11/22 1644     Visit Number 10    Number of Visits 17    Date for OT Re-Evaluation 11/18/22    Authorization Type human[employer], MCD    Authorization Time Period 9 weeks    Authorization - Visit Number 10    Progress Note Due on Visit 10    OT Start Time 1405    OT Stop Time 1430    OT Time Calculation (min) 25 min                     Past Medical History:  Diagnosis Date   Anemia    Arthritis    Ascites 03/28/2020   Bacteremia 02/11/2020   Cerebral infarction due to unspecified mechanism 05/22/2014   CHF (congestive heart failure)    Diabetes mellitus    Diabetic retinopathy    Hx of laser Rx's   DM type 1 (diabetes mellitus, type 1) 09/12/2014   Age of onset for DM type 1 was age 43.      Enlarged thyroid gland    ESRD on hemodialysis    ESRD due to DM type I, age of onset DM 1 was age 3.  Went on dialysis in March 2012.  Gets HD now at [facility] on a TTS schedule.     ESRD on hemodialysis 09/12/2014   ESRD due to DM type 1.  Started HD in 2012 at [facility].  Gets HD there on a TTS schedule.     GERD (gastroesophageal reflux disease)    HCAP (healthcare-associated pneumonia) 09/11/2014   Hemorrhage from arteriovenous dialysis graft 10/20/2019    History of blood transfusion    Hyperlipidemia    Hypertension    Hypertensive emergency 05/22/2014   Peripheral vascular disease    Pneumonia    Past Surgical History:  Procedure Laterality Date   A/V FISTULAGRAM Right 10/31/2020   Procedure: A/V FISTULAGRAM;  Surgeon: Leonie Douglas, MD;  Location: [facility];  Service: Cardiovascular;  Laterality: Right;   ARTERIOVENOUS GRAFT PLACEMENT     ARTERY REPAIR Left 12/10/2012   Procedure: BRACHIAL ARTERY REPAIR;  Surgeon: Chuck Hint, MD;  Location: [facility] OR;  Service: Vascular;  Laterality: Left;  Exploration Left Brachial Artery for AVF   AV FISTULA PLACEMENT Right 01/11/2018   Procedure: INSERTION OF ARTERIOVENOUS (AV) GORE-TEX GRAFT  UPPER ARM;  Surgeon: Chuck Hint, MD;  Location: [facility] OR;  Service: Vascular;  Laterality: Right;   AVGG REMOVAL Left 08/12/2022   Procedure: LEFT ARM ARTERIOVENOUS GRAFT EXCISION WITH VEIN PATCH ANGIOPLASTY;  Surgeon: Maeola Harman, MD;  Location: [facility] OR;  Service: Vascular;  Laterality: Left;   BASCILIC VEIN TRANSPOSITION Right 09/28/2017   Procedure: FIRST STAGE BASILIC VEIN TRANSPOSITION;  Surgeon: Chuck Hint, MD;  Location: [facility] OR;  Service: Vascular;  Laterality: Right;   CESAREAN SECTION     CHEST TUBE INSERTION Right 06/01/2020   Procedure: CHEST TUBE INSERTION;  Surgeon: Josephine Igo, DO;  Location: [facility] ENDOSCOPY;  Service: Pulmonary;  Laterality: Right;   CHEST TUBE INSERTION Right 07/04/2020   Procedure: INSERTION PLEURAL DRAINAGE CATHETER;  Surgeon: Loreli Slot, MD;  Location: [facility] OR;  Service: Thoracic;  Laterality: Right;   EYE SURGERY     LAzer   EYE SURGERY Left    IR THORACENTESIS ASP PLEURAL SPACE W/IMG GUIDE  05/31/2020   IR THORACENTESIS ASP PLEURAL SPACE W/IMG GUIDE  06/22/2020   REVISION OF ARTERIOVENOUS GORETEX GRAFT Left 08/01/2016   Procedure: REVISION OF ARTERIOVENOUS GORETEX GRAFT;  Surgeon: Chuck Hint, MD;   Location: [facility] OR;  Service: Vascular;  Laterality: Left;   REVISION OF ARTERIOVENOUS GORETEX GRAFT Right 10/21/2019   Procedure: REVISION OF ARTERIOVENOUS GORETEX GRAFT ARM;  Surgeon: Larina Earthly, MD;  Location: [facility] OR;  Service: Vascular;  Laterality: Right;   RIGHT HEART CATH N/A 08/01/2020   Procedure: RIGHT HEART CATH;  Surgeon: Dolores Patty, MD;  Location: [facility];  Service: Cardiovascular;  Laterality: N/A;   SHUNTOGRAM Left 11/30/2013   Procedure: Betsey Amen;  Surgeon: Nada Libman, MD;  Location: [facility] CATH LAB;  Service: Cardiovascular;  Laterality: Left;   TEE WITHOUT CARDIOVERSION N/A 02/13/2020   Procedure: TRANSESOPHAGEAL ECHOCARDIOGRAM (TEE);  Surgeon: Wendall Stade, MD;  Location: [facility] ENDOSCOPY;  Service: Cardiovascular;  Laterality: N/A;   TEE WITHOUT CARDIOVERSION N/A 08/15/2022   Procedure: TRANSESOPHAGEAL ECHOCARDIOGRAM (TEE);  Surgeon: Maisie Fus, MD;  Location: [facility] ENDOSCOPY;  Service: Cardiovascular;  Laterality: N/A;   Patient Active Problem List   Diagnosis Date Noted   Infection of AV graft for dialysis 08/11/2022   Staphylococcus aureus bacteremia 08/08/2022   Pneumonia, staphylococcal 08/08/2022   Visit for wound check 07/26/2020   Pleural effusion    Difficulty breathing 05/30/2020   Exudative pleural effusion    Gram-positive bacteremia 02/11/2020   History of CVA (cerebrovascular accident) 03/11/2017   Hyperparathyroidism, secondary renal    ESRD on hemodialysis 09/12/2014   Diabetes mellitus type I 09/12/2014   History of diabetic retinopathy 09/12/2014   Cerebral infarction due to unspecified mechanism 05/22/2014   Essential hypertension, benign 05/22/2014   DM (diabetes mellitus) 05/22/2014   HLD (hyperlipidemia) 05/22/2014   Cerebral infarction 03/29/2014   Facial droop 03/29/2014   Mechanical complication of other vascular device, implant, and graft 01/12/2013   Anxiety disorder, unspecified 05/11/2012    ONSET DATE:  09/10/22-referral date  REFERRING DIAG:    R29.898 (ICD-10-CM) - Left hand weakness    THERAPY DIAG:  Pain in left hand  Pain in left arm  Stiffness of left shoulder, not elsewhere classified  Stiffness of left elbow, not elsewhere classified  Stiffness of left hand, not elsewhere classified  Other disturbances of skin sensation  Muscle weakness (generalized)  Other lack of coordination  Rationale for Evaluation and Treatment: Rehabilitation  SUBJECTIVE:   SUBJECTIVE STATEMENT:  Pt reports  she can now drain pasta Pt accompanied by: self  PERTINENT HISTORY:  s/p excision of left arm axillary AV loop graft, harvest of left basilic vein and vein angioplasty of left axillary artery by Dr. Randie Heinz on 08/12/22. This was performed secondary to nonfunctioning AV graft that was infected due to MSSA bacteremia. She is on IV abx at dialysis 3x/ week through 09/23/22. Patent is having some numbness, coldness and  pain in her left arm and hand. PMH ESRD on HD, HTN, HLD, anxiety, DM II, CVA   PRECAUTIONS: Other: dialysis , AV graft in RUE, on abx for infection  WEIGHT BEARING RESTRICTIONS: No  PAIN:  no  FALLS: Has patient fallen in last 6 months? No  LIVING ENVIRONMENT: Lives with: lives with their family Lives in: Other town house  PLOF: Independent  PATIENT GOALS:  to be able to use LUE better  NEXT MD VISIT: unknown  OBJECTIVE:   HAND DOMINANCE: Right  ADLs: Overall ADLs: increased time required, difficulty using LUE Transfers/ambulation related to ADLs: Eating: pt is not able to cut normally. Grooming: needs assist with styling hair UB Dressing: mod I with pullover shirt, needs help with buttons LB Dressing:  needs assist with tying  Toileting: mod I Bathing: mod I with sponge bath, increased time Tub Shower transfers: currently sponge bathing due to wound   FUNCTIONAL OUTCOME MEASURES: Quick Dash: 61.3%- 09/30/22   UPPER EXTREMITY ROM:   RUE A/ROM  WFLS.  Active ROM Right eval Left eval  Shoulder flexion  120  Shoulder abduction  80  Shoulder adduction    Shoulder extension    Shoulder internal rotation    Shoulder external rotation    Elbow flexion  130  Elbow extension  -25  Wrist flexion  55  Wrist extension  40  Wrist ulnar deviation    Wrist radial deviation    Wrist pronation  WFL  Wrist supination  90%   Left Composite finger flexion 50%, extension 90%, able to oppose digit 1 with Thumb   HAND FUNCTION: Grip strength: Right: 5 lbs; Left: 45 lbs  COORDINATION: 9 Hole Peg test: Right: 26.30 sec; Left: 8 placed in 2 mins  10/08/22- LUE 9 hole peg tesT:9 pegs placed and removed in 1 min 57 secs   SENSATION: Light touch: Impaired  Hot/Cold: Impaired   EDEMA: moderate in LUE   COGNITION: Overall cognitive status: Within functional limits for tasks assessed A OBSERVATIONS: Pt demonstrates hypersensitivity in LUE with guarding   TODAY'S TREATMENT:                                                                                                                              DATE:11/11/22- checked progress towards goals and upgraded HEP to red theraband 10 reps each. Pt requests d/c from OT today. All goals were met.    11/04/22 Pt was shown oval 8 splint for improved positioning of left DIP joint deformity, size 6 oval 8 improves positioning as DIP is laterally flexed. Pt returned demonstration of application and she was shown where to purchase. Pt wore splint as a trial during activities in therapy. Towel scrunching and flattening x 10 reps Placing and removing graded clothespins from target for sustained pinch. Pt demonstrates max difficulty with black clothespins, 8 lbs of pinch, oval 8 splint used. Reviewed yellow theraband exercises previously issued, 10 reps each for bilateral  and LUE strength, min v.c , Attempted  red band however it was too challenging. Pinching yellow putty into a mound for finger strength  then flattening min v.c Placing small pegs into pegboard with LUE, min-mod difficulty and increased time.      10/28/22-Reviewed red putty exercises for sustained grip and pinch as well as yellow theraband exercises, min v.c and demonstration. Attempted placing grooved pegs however it was too challenging so task was changed to regular small pegs, Mod difficulty and v.c for placing, min difficulty removing pegs from pegboard. Therapist initiated fabrication of a finger splint for improved finger alignment however she did not issue as it will require adjustments.     10/21/22- BP 132/72 Tendon gliding exercises Red putty issued for sustained grip and pinch, min v.c  Cane exercises for shoulder flexion and chest press, min v.c Tying shoe with leg crossed across knee with improved performance. Pt was instructed to consider using finger splint while tying shoe. Pt left at home. Yellow theraband exercises 10 reps each, min v.c and demonstration. See pt. instructions    PATIENT EDUCATION: Education details: , theraband - red HEP, correct application of oval 8 splint to left index finger. Person educated: Patient Education method: Explanation, Demonstration, Verbal cues,  Education comprehension: verbalized understanding, returned demonstration, and verbal cues required   HOME EXERCISE PROGRAM: 09/16/22- tendon gliding  Putty issued GOALS: Goals reviewed with patient? Yes potential goals discussed  SHORT TERM GOALS: Target date: 10/14/22  I with initial HEP. Baseline:dependent Goal status:met 10/07/22  2.  Pt will increase LUE grip strength to 10 lbs or greater for increased functional use. Baseline: Grip strength: Right: 5 lbs; Left: 45 lbs Goal status: met 18 lbs 10/07/22  3.  Pt will demonstrate improved LUE fine motor as evidenced by decreasing 9 hole peg test to completing test in 2 mins or less. Baseline: 8 pegs placed in 2 mins with LUE Goal status: met , completed in 1 min 57-  10/09/22  4.  Pt will consistently use LUE as a gross assist at least 25% of the time with pain less than or equal to 4/10. Baseline: uses LUE less than 10%, pain 5/10 Goal status: met, uses 50% of the time, no reports of pain today.  5.  Pt will demonstrate ability to retrieve a lightweight object at 125 shoulder flexion and -20 elbow extension without drops using LUE. Baseline: LUE shoulder flexion 120, elbow extension -25 Goal status: met,130, -15  6.  Pt will perform all basic ADLS mod I. Baseline: Pt requires assist with tying shoes, buttons, styling hair Goal status: met, pt was able to tie shoes today, pt reports styling her own hair her own hair and fastening buttons  LONG TERM GOALS: Target date: 11/18/22  I with updated HEP Baseline: dependent Goal status:met, putty and theraband 11/11/22  2.  Pt will increase LUE grip strength to 25 lbs or greater for increased functional use. Baseline: Grip strength: Right: 5 lbs; Left: 45 lbs Goal status ongoing : met 26 lbs 11/11/22 3.  Pt will demonstrate improved LUE fine motor for ADLS as evidenced by decreasing 9 hole peg test to completing test in 1 min 45 secs. Baseline: 8 pegs placed in 2 mins with LUE Goal status: met, 44.96  4.  Pt will consistently use LUE as a gross assist at least 40% of the time with pain less than or equal to 4/10. Baseline: uses LUE less than 10%, pain 5/10 Goal status:  met  uses 80% of the time as an assist, no pain   5.  Pt. will improve Quick Dash score to 55% disability or less. Baseline: 61.3% disability 09/30/22 Goal status:met,  2.5% disability  6.  Pt will resume performance of light home management and cooking at a mod I level. Baseline: dependent Goal status: met, 11/11/22  ASSESSMENT:  CLINICAL IMPRESSION:  Pt made excellent overall progress. She achieved all long and short term goals. PERFORMANCE DEFICITS: in functional skills including ADLs, IADLs, coordination, dexterity, sensation,  edema, ROM, strength, pain, flexibility, Fine motor control, Gross motor control, decreased knowledge of precautions, decreased knowledge of use of DME, wound, skin integrity, and UE functional use, psychosocial skills including coping strategies, environmental adaptation, habits, interpersonal interactions, and routines and behaviors.   IMPAIRMENTS: are limiting patient from ADLs, IADLs, rest and sleep, play, leisure, and social participation.   COMORBIDITIES: may have co-morbidities  that affects occupational performance. Patient will benefit from skilled OT to address above impairments and improve overall function.  MODIFICATION OR ASSISTANCE TO COMPLETE EVALUATION: Min-Moderate modification of tasks or assist with assess necessary to complete an evaluation.  OT OCCUPATIONAL PROFILE AND HISTORY: Detailed assessment: Review of records and additional review of physical, cognitive, psychosocial history related to current functional performance.  CLINICAL DECISION MAKING: LOW - limited treatment options, no task modification necessary  REHAB POTENTIAL: Good  EVALUATION COMPLEXITY: Low      PLAN:  OT FREQUENCY: 2x/week  OT DURATION: 8 weeks plus eval  PLANNED INTERVENTIONS: self care/ADL training, therapeutic exercise, therapeutic activity, neuromuscular re-education, manual therapy, scar mobilization, manual lymph drainage, passive range of motion, splinting, ultrasound, paraffin, moist heat, cryotherapy, contrast bath, patient/family education, energy conservation, coping strategies training, DME and/or AE instructions, and Re-evaluation  RECOMMENDED OTHER SERVICES: n/a  CONSULTED AND AGREED WITH PLAN OF CARE: Patient  PLAN FOR NEXT SESSION: d/c OT Nikesh Teschner, OT 11/11/2022, 4:45 PM

## 2022-11-13 DIAGNOSIS — Z992 Dependence on renal dialysis: Secondary | ICD-10-CM | POA: Diagnosis not present

## 2022-11-13 DIAGNOSIS — N2581 Secondary hyperparathyroidism of renal origin: Secondary | ICD-10-CM | POA: Diagnosis not present

## 2022-11-13 DIAGNOSIS — N186 End stage renal disease: Secondary | ICD-10-CM | POA: Diagnosis not present

## 2022-11-15 DIAGNOSIS — N2581 Secondary hyperparathyroidism of renal origin: Secondary | ICD-10-CM | POA: Diagnosis not present

## 2022-11-15 DIAGNOSIS — Z992 Dependence on renal dialysis: Secondary | ICD-10-CM | POA: Diagnosis not present

## 2022-11-15 DIAGNOSIS — N186 End stage renal disease: Secondary | ICD-10-CM | POA: Diagnosis not present

## 2022-11-18 ENCOUNTER — Ambulatory Visit: Payer: Medicare HMO | Admitting: Occupational Therapy

## 2022-11-18 DIAGNOSIS — N2581 Secondary hyperparathyroidism of renal origin: Secondary | ICD-10-CM | POA: Diagnosis not present

## 2022-11-18 DIAGNOSIS — N186 End stage renal disease: Secondary | ICD-10-CM | POA: Diagnosis not present

## 2022-11-18 DIAGNOSIS — E1029 Type 1 diabetes mellitus with other diabetic kidney complication: Secondary | ICD-10-CM | POA: Diagnosis not present

## 2022-11-18 DIAGNOSIS — Z992 Dependence on renal dialysis: Secondary | ICD-10-CM | POA: Diagnosis not present

## 2022-11-20 DIAGNOSIS — N2581 Secondary hyperparathyroidism of renal origin: Secondary | ICD-10-CM | POA: Diagnosis not present

## 2022-11-20 DIAGNOSIS — Z992 Dependence on renal dialysis: Secondary | ICD-10-CM | POA: Diagnosis not present

## 2022-11-20 DIAGNOSIS — N186 End stage renal disease: Secondary | ICD-10-CM | POA: Diagnosis not present

## 2022-11-22 DIAGNOSIS — N186 End stage renal disease: Secondary | ICD-10-CM | POA: Diagnosis not present

## 2022-11-22 DIAGNOSIS — N2581 Secondary hyperparathyroidism of renal origin: Secondary | ICD-10-CM | POA: Diagnosis not present

## 2022-11-22 DIAGNOSIS — Z992 Dependence on renal dialysis: Secondary | ICD-10-CM | POA: Diagnosis not present

## 2022-11-24 DIAGNOSIS — E1022 Type 1 diabetes mellitus with diabetic chronic kidney disease: Secondary | ICD-10-CM | POA: Diagnosis not present

## 2022-11-25 DIAGNOSIS — Z992 Dependence on renal dialysis: Secondary | ICD-10-CM | POA: Diagnosis not present

## 2022-11-25 DIAGNOSIS — N2581 Secondary hyperparathyroidism of renal origin: Secondary | ICD-10-CM | POA: Diagnosis not present

## 2022-11-25 DIAGNOSIS — N186 End stage renal disease: Secondary | ICD-10-CM | POA: Diagnosis not present

## 2022-11-27 DIAGNOSIS — N2581 Secondary hyperparathyroidism of renal origin: Secondary | ICD-10-CM | POA: Diagnosis not present

## 2022-11-27 DIAGNOSIS — Z992 Dependence on renal dialysis: Secondary | ICD-10-CM | POA: Diagnosis not present

## 2022-11-27 DIAGNOSIS — N186 End stage renal disease: Secondary | ICD-10-CM | POA: Diagnosis not present

## 2022-11-29 DIAGNOSIS — N186 End stage renal disease: Secondary | ICD-10-CM | POA: Diagnosis not present

## 2022-11-29 DIAGNOSIS — Z992 Dependence on renal dialysis: Secondary | ICD-10-CM | POA: Diagnosis not present

## 2022-11-29 DIAGNOSIS — N2581 Secondary hyperparathyroidism of renal origin: Secondary | ICD-10-CM | POA: Diagnosis not present

## 2022-12-02 DIAGNOSIS — N2581 Secondary hyperparathyroidism of renal origin: Secondary | ICD-10-CM | POA: Diagnosis not present

## 2022-12-02 DIAGNOSIS — N186 End stage renal disease: Secondary | ICD-10-CM | POA: Diagnosis not present

## 2022-12-02 DIAGNOSIS — Z992 Dependence on renal dialysis: Secondary | ICD-10-CM | POA: Diagnosis not present

## 2022-12-04 DIAGNOSIS — N186 End stage renal disease: Secondary | ICD-10-CM | POA: Diagnosis not present

## 2022-12-04 DIAGNOSIS — N2581 Secondary hyperparathyroidism of renal origin: Secondary | ICD-10-CM | POA: Diagnosis not present

## 2022-12-04 DIAGNOSIS — Z992 Dependence on renal dialysis: Secondary | ICD-10-CM | POA: Diagnosis not present

## 2022-12-06 DIAGNOSIS — N2581 Secondary hyperparathyroidism of renal origin: Secondary | ICD-10-CM | POA: Diagnosis not present

## 2022-12-06 DIAGNOSIS — N186 End stage renal disease: Secondary | ICD-10-CM | POA: Diagnosis not present

## 2022-12-06 DIAGNOSIS — Z992 Dependence on renal dialysis: Secondary | ICD-10-CM | POA: Diagnosis not present

## 2022-12-08 DIAGNOSIS — E1122 Type 2 diabetes mellitus with diabetic chronic kidney disease: Secondary | ICD-10-CM | POA: Diagnosis not present

## 2022-12-08 DIAGNOSIS — E1165 Type 2 diabetes mellitus with hyperglycemia: Secondary | ICD-10-CM | POA: Diagnosis not present

## 2022-12-08 DIAGNOSIS — Z992 Dependence on renal dialysis: Secondary | ICD-10-CM | POA: Diagnosis not present

## 2022-12-08 DIAGNOSIS — E782 Mixed hyperlipidemia: Secondary | ICD-10-CM | POA: Diagnosis not present

## 2022-12-08 DIAGNOSIS — Z794 Long term (current) use of insulin: Secondary | ICD-10-CM | POA: Diagnosis not present

## 2022-12-08 DIAGNOSIS — I119 Hypertensive heart disease without heart failure: Secondary | ICD-10-CM | POA: Diagnosis not present

## 2022-12-08 DIAGNOSIS — N186 End stage renal disease: Secondary | ICD-10-CM | POA: Diagnosis not present

## 2022-12-08 DIAGNOSIS — Z0001 Encounter for general adult medical examination with abnormal findings: Secondary | ICD-10-CM | POA: Diagnosis not present

## 2022-12-08 DIAGNOSIS — D631 Anemia in chronic kidney disease: Secondary | ICD-10-CM | POA: Diagnosis not present

## 2022-12-08 DIAGNOSIS — D638 Anemia in other chronic diseases classified elsewhere: Secondary | ICD-10-CM | POA: Diagnosis not present

## 2022-12-08 DIAGNOSIS — I10 Essential (primary) hypertension: Secondary | ICD-10-CM | POA: Diagnosis not present

## 2022-12-09 DIAGNOSIS — E1022 Type 1 diabetes mellitus with diabetic chronic kidney disease: Secondary | ICD-10-CM | POA: Diagnosis not present

## 2022-12-09 DIAGNOSIS — N186 End stage renal disease: Secondary | ICD-10-CM | POA: Diagnosis not present

## 2022-12-09 DIAGNOSIS — N2581 Secondary hyperparathyroidism of renal origin: Secondary | ICD-10-CM | POA: Diagnosis not present

## 2022-12-09 DIAGNOSIS — Z992 Dependence on renal dialysis: Secondary | ICD-10-CM | POA: Diagnosis not present

## 2022-12-11 DIAGNOSIS — Z992 Dependence on renal dialysis: Secondary | ICD-10-CM | POA: Diagnosis not present

## 2022-12-11 DIAGNOSIS — N186 End stage renal disease: Secondary | ICD-10-CM | POA: Diagnosis not present

## 2022-12-11 DIAGNOSIS — N2581 Secondary hyperparathyroidism of renal origin: Secondary | ICD-10-CM | POA: Diagnosis not present

## 2022-12-13 DIAGNOSIS — N186 End stage renal disease: Secondary | ICD-10-CM | POA: Diagnosis not present

## 2022-12-13 DIAGNOSIS — Z992 Dependence on renal dialysis: Secondary | ICD-10-CM | POA: Diagnosis not present

## 2022-12-13 DIAGNOSIS — N2581 Secondary hyperparathyroidism of renal origin: Secondary | ICD-10-CM | POA: Diagnosis not present

## 2022-12-16 DIAGNOSIS — N2581 Secondary hyperparathyroidism of renal origin: Secondary | ICD-10-CM | POA: Diagnosis not present

## 2022-12-16 DIAGNOSIS — N186 End stage renal disease: Secondary | ICD-10-CM | POA: Diagnosis not present

## 2022-12-16 DIAGNOSIS — Z992 Dependence on renal dialysis: Secondary | ICD-10-CM | POA: Diagnosis not present

## 2022-12-18 DIAGNOSIS — N186 End stage renal disease: Secondary | ICD-10-CM | POA: Diagnosis not present

## 2022-12-18 DIAGNOSIS — N2581 Secondary hyperparathyroidism of renal origin: Secondary | ICD-10-CM | POA: Diagnosis not present

## 2022-12-18 DIAGNOSIS — Z992 Dependence on renal dialysis: Secondary | ICD-10-CM | POA: Diagnosis not present

## 2022-12-19 DIAGNOSIS — N186 End stage renal disease: Secondary | ICD-10-CM | POA: Diagnosis not present

## 2022-12-19 DIAGNOSIS — E1029 Type 1 diabetes mellitus with other diabetic kidney complication: Secondary | ICD-10-CM | POA: Diagnosis not present

## 2022-12-19 DIAGNOSIS — Z992 Dependence on renal dialysis: Secondary | ICD-10-CM | POA: Diagnosis not present

## 2022-12-20 DIAGNOSIS — N2581 Secondary hyperparathyroidism of renal origin: Secondary | ICD-10-CM | POA: Diagnosis not present

## 2022-12-20 DIAGNOSIS — Z992 Dependence on renal dialysis: Secondary | ICD-10-CM | POA: Diagnosis not present

## 2022-12-20 DIAGNOSIS — N186 End stage renal disease: Secondary | ICD-10-CM | POA: Diagnosis not present

## 2022-12-22 DIAGNOSIS — J189 Pneumonia, unspecified organism: Secondary | ICD-10-CM | POA: Diagnosis not present

## 2022-12-22 DIAGNOSIS — I119 Hypertensive heart disease without heart failure: Secondary | ICD-10-CM | POA: Diagnosis not present

## 2022-12-22 DIAGNOSIS — N186 End stage renal disease: Secondary | ICD-10-CM | POA: Diagnosis not present

## 2022-12-22 DIAGNOSIS — E782 Mixed hyperlipidemia: Secondary | ICD-10-CM | POA: Diagnosis not present

## 2022-12-22 DIAGNOSIS — E1122 Type 2 diabetes mellitus with diabetic chronic kidney disease: Secondary | ICD-10-CM | POA: Diagnosis not present

## 2022-12-22 DIAGNOSIS — J90 Pleural effusion, not elsewhere classified: Secondary | ICD-10-CM | POA: Diagnosis not present

## 2022-12-22 DIAGNOSIS — Z794 Long term (current) use of insulin: Secondary | ICD-10-CM | POA: Diagnosis not present

## 2022-12-22 DIAGNOSIS — Z992 Dependence on renal dialysis: Secondary | ICD-10-CM | POA: Diagnosis not present

## 2022-12-22 DIAGNOSIS — D631 Anemia in chronic kidney disease: Secondary | ICD-10-CM | POA: Diagnosis not present

## 2022-12-23 DIAGNOSIS — Z992 Dependence on renal dialysis: Secondary | ICD-10-CM | POA: Diagnosis not present

## 2022-12-23 DIAGNOSIS — N2581 Secondary hyperparathyroidism of renal origin: Secondary | ICD-10-CM | POA: Diagnosis not present

## 2022-12-23 DIAGNOSIS — N186 End stage renal disease: Secondary | ICD-10-CM | POA: Diagnosis not present

## 2022-12-24 DIAGNOSIS — E1022 Type 1 diabetes mellitus with diabetic chronic kidney disease: Secondary | ICD-10-CM | POA: Diagnosis not present

## 2022-12-25 DIAGNOSIS — Z992 Dependence on renal dialysis: Secondary | ICD-10-CM | POA: Diagnosis not present

## 2022-12-25 DIAGNOSIS — N186 End stage renal disease: Secondary | ICD-10-CM | POA: Diagnosis not present

## 2022-12-25 DIAGNOSIS — N2581 Secondary hyperparathyroidism of renal origin: Secondary | ICD-10-CM | POA: Diagnosis not present

## 2022-12-26 DIAGNOSIS — Z992 Dependence on renal dialysis: Secondary | ICD-10-CM | POA: Diagnosis not present

## 2022-12-26 DIAGNOSIS — N186 End stage renal disease: Secondary | ICD-10-CM | POA: Diagnosis not present

## 2022-12-26 DIAGNOSIS — E1022 Type 1 diabetes mellitus with diabetic chronic kidney disease: Secondary | ICD-10-CM | POA: Diagnosis not present

## 2022-12-26 DIAGNOSIS — Z9641 Presence of insulin pump (external) (internal): Secondary | ICD-10-CM | POA: Diagnosis not present

## 2022-12-26 DIAGNOSIS — E103593 Type 1 diabetes mellitus with proliferative diabetic retinopathy without macular edema, bilateral: Secondary | ICD-10-CM | POA: Diagnosis not present

## 2022-12-27 DIAGNOSIS — N186 End stage renal disease: Secondary | ICD-10-CM | POA: Diagnosis not present

## 2022-12-27 DIAGNOSIS — N2581 Secondary hyperparathyroidism of renal origin: Secondary | ICD-10-CM | POA: Diagnosis not present

## 2022-12-27 DIAGNOSIS — Z992 Dependence on renal dialysis: Secondary | ICD-10-CM | POA: Diagnosis not present

## 2023-01-09 DIAGNOSIS — E1022 Type 1 diabetes mellitus with diabetic chronic kidney disease: Secondary | ICD-10-CM | POA: Diagnosis not present

## 2023-01-18 DIAGNOSIS — Z992 Dependence on renal dialysis: Secondary | ICD-10-CM | POA: Diagnosis not present

## 2023-01-18 DIAGNOSIS — N186 End stage renal disease: Secondary | ICD-10-CM | POA: Diagnosis not present

## 2023-01-18 DIAGNOSIS — E1029 Type 1 diabetes mellitus with other diabetic kidney complication: Secondary | ICD-10-CM | POA: Diagnosis not present

## 2023-02-08 DIAGNOSIS — E1022 Type 1 diabetes mellitus with diabetic chronic kidney disease: Secondary | ICD-10-CM | POA: Diagnosis not present

## 2023-02-16 DIAGNOSIS — E1022 Type 1 diabetes mellitus with diabetic chronic kidney disease: Secondary | ICD-10-CM | POA: Diagnosis not present

## 2023-02-26 DIAGNOSIS — N186 End stage renal disease: Secondary | ICD-10-CM | POA: Diagnosis not present

## 2023-02-26 DIAGNOSIS — N2581 Secondary hyperparathyroidism of renal origin: Secondary | ICD-10-CM | POA: Diagnosis not present

## 2023-02-26 DIAGNOSIS — Z992 Dependence on renal dialysis: Secondary | ICD-10-CM | POA: Diagnosis not present

## 2023-02-28 DIAGNOSIS — N186 End stage renal disease: Secondary | ICD-10-CM | POA: Diagnosis not present

## 2023-02-28 DIAGNOSIS — Z992 Dependence on renal dialysis: Secondary | ICD-10-CM | POA: Diagnosis not present

## 2023-02-28 DIAGNOSIS — N2581 Secondary hyperparathyroidism of renal origin: Secondary | ICD-10-CM | POA: Diagnosis not present

## 2023-03-03 DIAGNOSIS — N186 End stage renal disease: Secondary | ICD-10-CM | POA: Diagnosis not present

## 2023-03-03 DIAGNOSIS — N2581 Secondary hyperparathyroidism of renal origin: Secondary | ICD-10-CM | POA: Diagnosis not present

## 2023-03-03 DIAGNOSIS — Z992 Dependence on renal dialysis: Secondary | ICD-10-CM | POA: Diagnosis not present

## 2023-03-05 DIAGNOSIS — Z992 Dependence on renal dialysis: Secondary | ICD-10-CM | POA: Diagnosis not present

## 2023-03-05 DIAGNOSIS — N2581 Secondary hyperparathyroidism of renal origin: Secondary | ICD-10-CM | POA: Diagnosis not present

## 2023-03-05 DIAGNOSIS — N186 End stage renal disease: Secondary | ICD-10-CM | POA: Diagnosis not present

## 2023-03-07 DIAGNOSIS — N2581 Secondary hyperparathyroidism of renal origin: Secondary | ICD-10-CM | POA: Diagnosis not present

## 2023-03-07 DIAGNOSIS — N186 End stage renal disease: Secondary | ICD-10-CM | POA: Diagnosis not present

## 2023-03-07 DIAGNOSIS — Z992 Dependence on renal dialysis: Secondary | ICD-10-CM | POA: Diagnosis not present

## 2023-03-10 DIAGNOSIS — Z992 Dependence on renal dialysis: Secondary | ICD-10-CM | POA: Diagnosis not present

## 2023-03-10 DIAGNOSIS — N2581 Secondary hyperparathyroidism of renal origin: Secondary | ICD-10-CM | POA: Diagnosis not present

## 2023-03-10 DIAGNOSIS — N186 End stage renal disease: Secondary | ICD-10-CM | POA: Diagnosis not present

## 2023-03-11 DIAGNOSIS — E1022 Type 1 diabetes mellitus with diabetic chronic kidney disease: Secondary | ICD-10-CM | POA: Diagnosis not present

## 2023-03-12 DIAGNOSIS — N2581 Secondary hyperparathyroidism of renal origin: Secondary | ICD-10-CM | POA: Diagnosis not present

## 2023-03-12 DIAGNOSIS — N186 End stage renal disease: Secondary | ICD-10-CM | POA: Diagnosis not present

## 2023-03-12 DIAGNOSIS — Z992 Dependence on renal dialysis: Secondary | ICD-10-CM | POA: Diagnosis not present

## 2023-03-14 DIAGNOSIS — Z992 Dependence on renal dialysis: Secondary | ICD-10-CM | POA: Diagnosis not present

## 2023-03-14 DIAGNOSIS — N186 End stage renal disease: Secondary | ICD-10-CM | POA: Diagnosis not present

## 2023-03-14 DIAGNOSIS — N2581 Secondary hyperparathyroidism of renal origin: Secondary | ICD-10-CM | POA: Diagnosis not present

## 2023-03-17 DIAGNOSIS — N186 End stage renal disease: Secondary | ICD-10-CM | POA: Diagnosis not present

## 2023-03-17 DIAGNOSIS — N2581 Secondary hyperparathyroidism of renal origin: Secondary | ICD-10-CM | POA: Diagnosis not present

## 2023-03-17 DIAGNOSIS — Z992 Dependence on renal dialysis: Secondary | ICD-10-CM | POA: Diagnosis not present

## 2023-03-19 DIAGNOSIS — Z992 Dependence on renal dialysis: Secondary | ICD-10-CM | POA: Diagnosis not present

## 2023-03-19 DIAGNOSIS — N186 End stage renal disease: Secondary | ICD-10-CM | POA: Diagnosis not present

## 2023-03-19 DIAGNOSIS — N2581 Secondary hyperparathyroidism of renal origin: Secondary | ICD-10-CM | POA: Diagnosis not present

## 2023-03-21 DIAGNOSIS — E1029 Type 1 diabetes mellitus with other diabetic kidney complication: Secondary | ICD-10-CM | POA: Diagnosis not present

## 2023-03-21 DIAGNOSIS — N186 End stage renal disease: Secondary | ICD-10-CM | POA: Diagnosis not present

## 2023-03-21 DIAGNOSIS — N2581 Secondary hyperparathyroidism of renal origin: Secondary | ICD-10-CM | POA: Diagnosis not present

## 2023-03-21 DIAGNOSIS — Z992 Dependence on renal dialysis: Secondary | ICD-10-CM | POA: Diagnosis not present

## 2023-03-24 DIAGNOSIS — N2581 Secondary hyperparathyroidism of renal origin: Secondary | ICD-10-CM | POA: Diagnosis not present

## 2023-03-24 DIAGNOSIS — Z992 Dependence on renal dialysis: Secondary | ICD-10-CM | POA: Diagnosis not present

## 2023-03-24 DIAGNOSIS — N186 End stage renal disease: Secondary | ICD-10-CM | POA: Diagnosis not present

## 2023-03-26 DIAGNOSIS — N2581 Secondary hyperparathyroidism of renal origin: Secondary | ICD-10-CM | POA: Diagnosis not present

## 2023-03-26 DIAGNOSIS — Z992 Dependence on renal dialysis: Secondary | ICD-10-CM | POA: Diagnosis not present

## 2023-03-26 DIAGNOSIS — N186 End stage renal disease: Secondary | ICD-10-CM | POA: Diagnosis not present

## 2023-03-26 DIAGNOSIS — E1022 Type 1 diabetes mellitus with diabetic chronic kidney disease: Secondary | ICD-10-CM | POA: Diagnosis not present

## 2023-03-28 DIAGNOSIS — N2581 Secondary hyperparathyroidism of renal origin: Secondary | ICD-10-CM | POA: Diagnosis not present

## 2023-03-28 DIAGNOSIS — N186 End stage renal disease: Secondary | ICD-10-CM | POA: Diagnosis not present

## 2023-03-28 DIAGNOSIS — Z992 Dependence on renal dialysis: Secondary | ICD-10-CM | POA: Diagnosis not present

## 2023-03-30 DIAGNOSIS — E1022 Type 1 diabetes mellitus with diabetic chronic kidney disease: Secondary | ICD-10-CM | POA: Diagnosis not present

## 2023-03-30 DIAGNOSIS — N186 End stage renal disease: Secondary | ICD-10-CM | POA: Diagnosis not present

## 2023-03-30 DIAGNOSIS — Z9641 Presence of insulin pump (external) (internal): Secondary | ICD-10-CM | POA: Diagnosis not present

## 2023-03-30 DIAGNOSIS — E103593 Type 1 diabetes mellitus with proliferative diabetic retinopathy without macular edema, bilateral: Secondary | ICD-10-CM | POA: Diagnosis not present

## 2023-03-30 DIAGNOSIS — Z992 Dependence on renal dialysis: Secondary | ICD-10-CM | POA: Diagnosis not present

## 2023-03-31 DIAGNOSIS — Z992 Dependence on renal dialysis: Secondary | ICD-10-CM | POA: Diagnosis not present

## 2023-03-31 DIAGNOSIS — N2581 Secondary hyperparathyroidism of renal origin: Secondary | ICD-10-CM | POA: Diagnosis not present

## 2023-03-31 DIAGNOSIS — N186 End stage renal disease: Secondary | ICD-10-CM | POA: Diagnosis not present

## 2023-04-02 DIAGNOSIS — N2581 Secondary hyperparathyroidism of renal origin: Secondary | ICD-10-CM | POA: Diagnosis not present

## 2023-04-02 DIAGNOSIS — Z992 Dependence on renal dialysis: Secondary | ICD-10-CM | POA: Diagnosis not present

## 2023-04-02 DIAGNOSIS — N186 End stage renal disease: Secondary | ICD-10-CM | POA: Diagnosis not present

## 2023-04-04 DIAGNOSIS — Z992 Dependence on renal dialysis: Secondary | ICD-10-CM | POA: Diagnosis not present

## 2023-04-04 DIAGNOSIS — N186 End stage renal disease: Secondary | ICD-10-CM | POA: Diagnosis not present

## 2023-04-04 DIAGNOSIS — N2581 Secondary hyperparathyroidism of renal origin: Secondary | ICD-10-CM | POA: Diagnosis not present

## 2023-04-07 DIAGNOSIS — Z992 Dependence on renal dialysis: Secondary | ICD-10-CM | POA: Diagnosis not present

## 2023-04-07 DIAGNOSIS — N186 End stage renal disease: Secondary | ICD-10-CM | POA: Diagnosis not present

## 2023-04-07 DIAGNOSIS — N2581 Secondary hyperparathyroidism of renal origin: Secondary | ICD-10-CM | POA: Diagnosis not present

## 2023-04-09 DIAGNOSIS — Z992 Dependence on renal dialysis: Secondary | ICD-10-CM | POA: Diagnosis not present

## 2023-04-09 DIAGNOSIS — N186 End stage renal disease: Secondary | ICD-10-CM | POA: Diagnosis not present

## 2023-04-09 DIAGNOSIS — N2581 Secondary hyperparathyroidism of renal origin: Secondary | ICD-10-CM | POA: Diagnosis not present

## 2023-04-11 DIAGNOSIS — N2581 Secondary hyperparathyroidism of renal origin: Secondary | ICD-10-CM | POA: Diagnosis not present

## 2023-04-11 DIAGNOSIS — N186 End stage renal disease: Secondary | ICD-10-CM | POA: Diagnosis not present

## 2023-04-11 DIAGNOSIS — E1022 Type 1 diabetes mellitus with diabetic chronic kidney disease: Secondary | ICD-10-CM | POA: Diagnosis not present

## 2023-04-11 DIAGNOSIS — Z992 Dependence on renal dialysis: Secondary | ICD-10-CM | POA: Diagnosis not present

## 2023-04-14 DIAGNOSIS — Z992 Dependence on renal dialysis: Secondary | ICD-10-CM | POA: Diagnosis not present

## 2023-04-14 DIAGNOSIS — N2581 Secondary hyperparathyroidism of renal origin: Secondary | ICD-10-CM | POA: Diagnosis not present

## 2023-04-14 DIAGNOSIS — N186 End stage renal disease: Secondary | ICD-10-CM | POA: Diagnosis not present

## 2023-04-16 DIAGNOSIS — N2581 Secondary hyperparathyroidism of renal origin: Secondary | ICD-10-CM | POA: Diagnosis not present

## 2023-04-16 DIAGNOSIS — Z992 Dependence on renal dialysis: Secondary | ICD-10-CM | POA: Diagnosis not present

## 2023-04-16 DIAGNOSIS — N186 End stage renal disease: Secondary | ICD-10-CM | POA: Diagnosis not present

## 2023-04-18 DIAGNOSIS — N186 End stage renal disease: Secondary | ICD-10-CM | POA: Diagnosis not present

## 2023-04-18 DIAGNOSIS — Z992 Dependence on renal dialysis: Secondary | ICD-10-CM | POA: Diagnosis not present

## 2023-04-18 DIAGNOSIS — N2581 Secondary hyperparathyroidism of renal origin: Secondary | ICD-10-CM | POA: Diagnosis not present

## 2023-04-20 DIAGNOSIS — N186 End stage renal disease: Secondary | ICD-10-CM | POA: Diagnosis not present

## 2023-04-20 DIAGNOSIS — Z992 Dependence on renal dialysis: Secondary | ICD-10-CM | POA: Diagnosis not present

## 2023-04-20 DIAGNOSIS — E1029 Type 1 diabetes mellitus with other diabetic kidney complication: Secondary | ICD-10-CM | POA: Diagnosis not present

## 2023-04-21 DIAGNOSIS — N186 End stage renal disease: Secondary | ICD-10-CM | POA: Diagnosis not present

## 2023-04-21 DIAGNOSIS — N2581 Secondary hyperparathyroidism of renal origin: Secondary | ICD-10-CM | POA: Diagnosis not present

## 2023-04-21 DIAGNOSIS — Z992 Dependence on renal dialysis: Secondary | ICD-10-CM | POA: Diagnosis not present

## 2023-04-23 DIAGNOSIS — N2581 Secondary hyperparathyroidism of renal origin: Secondary | ICD-10-CM | POA: Diagnosis not present

## 2023-04-23 DIAGNOSIS — N186 End stage renal disease: Secondary | ICD-10-CM | POA: Diagnosis not present

## 2023-04-23 DIAGNOSIS — Z992 Dependence on renal dialysis: Secondary | ICD-10-CM | POA: Diagnosis not present

## 2023-04-24 DIAGNOSIS — E1022 Type 1 diabetes mellitus with diabetic chronic kidney disease: Secondary | ICD-10-CM | POA: Diagnosis not present

## 2023-04-25 DIAGNOSIS — N2581 Secondary hyperparathyroidism of renal origin: Secondary | ICD-10-CM | POA: Diagnosis not present

## 2023-04-25 DIAGNOSIS — Z992 Dependence on renal dialysis: Secondary | ICD-10-CM | POA: Diagnosis not present

## 2023-04-25 DIAGNOSIS — N186 End stage renal disease: Secondary | ICD-10-CM | POA: Diagnosis not present

## 2023-04-28 DIAGNOSIS — Z992 Dependence on renal dialysis: Secondary | ICD-10-CM | POA: Diagnosis not present

## 2023-04-28 DIAGNOSIS — N186 End stage renal disease: Secondary | ICD-10-CM | POA: Diagnosis not present

## 2023-04-28 DIAGNOSIS — N2581 Secondary hyperparathyroidism of renal origin: Secondary | ICD-10-CM | POA: Diagnosis not present

## 2023-04-30 DIAGNOSIS — N2581 Secondary hyperparathyroidism of renal origin: Secondary | ICD-10-CM | POA: Diagnosis not present

## 2023-04-30 DIAGNOSIS — N186 End stage renal disease: Secondary | ICD-10-CM | POA: Diagnosis not present

## 2023-04-30 DIAGNOSIS — Z992 Dependence on renal dialysis: Secondary | ICD-10-CM | POA: Diagnosis not present

## 2023-05-02 DIAGNOSIS — Z992 Dependence on renal dialysis: Secondary | ICD-10-CM | POA: Diagnosis not present

## 2023-05-02 DIAGNOSIS — N186 End stage renal disease: Secondary | ICD-10-CM | POA: Diagnosis not present

## 2023-05-02 DIAGNOSIS — N2581 Secondary hyperparathyroidism of renal origin: Secondary | ICD-10-CM | POA: Diagnosis not present

## 2023-05-05 DIAGNOSIS — Z992 Dependence on renal dialysis: Secondary | ICD-10-CM | POA: Diagnosis not present

## 2023-05-05 DIAGNOSIS — N186 End stage renal disease: Secondary | ICD-10-CM | POA: Diagnosis not present

## 2023-05-05 DIAGNOSIS — N2581 Secondary hyperparathyroidism of renal origin: Secondary | ICD-10-CM | POA: Diagnosis not present

## 2023-05-07 DIAGNOSIS — N2581 Secondary hyperparathyroidism of renal origin: Secondary | ICD-10-CM | POA: Diagnosis not present

## 2023-05-07 DIAGNOSIS — N186 End stage renal disease: Secondary | ICD-10-CM | POA: Diagnosis not present

## 2023-05-07 DIAGNOSIS — Z992 Dependence on renal dialysis: Secondary | ICD-10-CM | POA: Diagnosis not present

## 2023-05-09 DIAGNOSIS — N2581 Secondary hyperparathyroidism of renal origin: Secondary | ICD-10-CM | POA: Diagnosis not present

## 2023-05-09 DIAGNOSIS — N186 End stage renal disease: Secondary | ICD-10-CM | POA: Diagnosis not present

## 2023-05-09 DIAGNOSIS — Z992 Dependence on renal dialysis: Secondary | ICD-10-CM | POA: Diagnosis not present

## 2023-05-11 DIAGNOSIS — E1022 Type 1 diabetes mellitus with diabetic chronic kidney disease: Secondary | ICD-10-CM | POA: Diagnosis not present

## 2023-05-12 DIAGNOSIS — N186 End stage renal disease: Secondary | ICD-10-CM | POA: Diagnosis not present

## 2023-05-12 DIAGNOSIS — N2581 Secondary hyperparathyroidism of renal origin: Secondary | ICD-10-CM | POA: Diagnosis not present

## 2023-05-12 DIAGNOSIS — Z992 Dependence on renal dialysis: Secondary | ICD-10-CM | POA: Diagnosis not present

## 2023-05-14 DIAGNOSIS — N2581 Secondary hyperparathyroidism of renal origin: Secondary | ICD-10-CM | POA: Diagnosis not present

## 2023-05-14 DIAGNOSIS — Z992 Dependence on renal dialysis: Secondary | ICD-10-CM | POA: Diagnosis not present

## 2023-05-14 DIAGNOSIS — N186 End stage renal disease: Secondary | ICD-10-CM | POA: Diagnosis not present

## 2023-05-16 DIAGNOSIS — Z992 Dependence on renal dialysis: Secondary | ICD-10-CM | POA: Diagnosis not present

## 2023-05-16 DIAGNOSIS — N186 End stage renal disease: Secondary | ICD-10-CM | POA: Diagnosis not present

## 2023-05-16 DIAGNOSIS — N2581 Secondary hyperparathyroidism of renal origin: Secondary | ICD-10-CM | POA: Diagnosis not present

## 2023-05-19 DIAGNOSIS — Z992 Dependence on renal dialysis: Secondary | ICD-10-CM | POA: Diagnosis not present

## 2023-05-19 DIAGNOSIS — N2581 Secondary hyperparathyroidism of renal origin: Secondary | ICD-10-CM | POA: Diagnosis not present

## 2023-05-19 DIAGNOSIS — N186 End stage renal disease: Secondary | ICD-10-CM | POA: Diagnosis not present

## 2023-05-21 DIAGNOSIS — E1029 Type 1 diabetes mellitus with other diabetic kidney complication: Secondary | ICD-10-CM | POA: Diagnosis not present

## 2023-05-21 DIAGNOSIS — N2581 Secondary hyperparathyroidism of renal origin: Secondary | ICD-10-CM | POA: Diagnosis not present

## 2023-05-21 DIAGNOSIS — N186 End stage renal disease: Secondary | ICD-10-CM | POA: Diagnosis not present

## 2023-05-21 DIAGNOSIS — Z992 Dependence on renal dialysis: Secondary | ICD-10-CM | POA: Diagnosis not present

## 2023-05-23 DIAGNOSIS — Z992 Dependence on renal dialysis: Secondary | ICD-10-CM | POA: Diagnosis not present

## 2023-05-23 DIAGNOSIS — N2581 Secondary hyperparathyroidism of renal origin: Secondary | ICD-10-CM | POA: Diagnosis not present

## 2023-05-23 DIAGNOSIS — N186 End stage renal disease: Secondary | ICD-10-CM | POA: Diagnosis not present

## 2023-05-26 DIAGNOSIS — Z992 Dependence on renal dialysis: Secondary | ICD-10-CM | POA: Diagnosis not present

## 2023-05-26 DIAGNOSIS — N186 End stage renal disease: Secondary | ICD-10-CM | POA: Diagnosis not present

## 2023-05-26 DIAGNOSIS — N2581 Secondary hyperparathyroidism of renal origin: Secondary | ICD-10-CM | POA: Diagnosis not present

## 2023-05-26 DIAGNOSIS — E1022 Type 1 diabetes mellitus with diabetic chronic kidney disease: Secondary | ICD-10-CM | POA: Diagnosis not present

## 2023-05-28 DIAGNOSIS — Z992 Dependence on renal dialysis: Secondary | ICD-10-CM | POA: Diagnosis not present

## 2023-05-28 DIAGNOSIS — N2581 Secondary hyperparathyroidism of renal origin: Secondary | ICD-10-CM | POA: Diagnosis not present

## 2023-05-28 DIAGNOSIS — N186 End stage renal disease: Secondary | ICD-10-CM | POA: Diagnosis not present

## 2023-05-30 DIAGNOSIS — N186 End stage renal disease: Secondary | ICD-10-CM | POA: Diagnosis not present

## 2023-05-30 DIAGNOSIS — Z992 Dependence on renal dialysis: Secondary | ICD-10-CM | POA: Diagnosis not present

## 2023-05-30 DIAGNOSIS — N2581 Secondary hyperparathyroidism of renal origin: Secondary | ICD-10-CM | POA: Diagnosis not present

## 2023-06-02 DIAGNOSIS — Z992 Dependence on renal dialysis: Secondary | ICD-10-CM | POA: Diagnosis not present

## 2023-06-02 DIAGNOSIS — N2581 Secondary hyperparathyroidism of renal origin: Secondary | ICD-10-CM | POA: Diagnosis not present

## 2023-06-02 DIAGNOSIS — N186 End stage renal disease: Secondary | ICD-10-CM | POA: Diagnosis not present

## 2023-06-04 DIAGNOSIS — Z992 Dependence on renal dialysis: Secondary | ICD-10-CM | POA: Diagnosis not present

## 2023-06-04 DIAGNOSIS — N2581 Secondary hyperparathyroidism of renal origin: Secondary | ICD-10-CM | POA: Diagnosis not present

## 2023-06-04 DIAGNOSIS — N186 End stage renal disease: Secondary | ICD-10-CM | POA: Diagnosis not present

## 2023-06-06 DIAGNOSIS — N186 End stage renal disease: Secondary | ICD-10-CM | POA: Diagnosis not present

## 2023-06-06 DIAGNOSIS — Z992 Dependence on renal dialysis: Secondary | ICD-10-CM | POA: Diagnosis not present

## 2023-06-06 DIAGNOSIS — N2581 Secondary hyperparathyroidism of renal origin: Secondary | ICD-10-CM | POA: Diagnosis not present

## 2023-06-08 DIAGNOSIS — E782 Mixed hyperlipidemia: Secondary | ICD-10-CM | POA: Diagnosis not present

## 2023-06-08 DIAGNOSIS — Z0000 Encounter for general adult medical examination without abnormal findings: Secondary | ICD-10-CM | POA: Diagnosis not present

## 2023-06-08 DIAGNOSIS — Z794 Long term (current) use of insulin: Secondary | ICD-10-CM | POA: Diagnosis not present

## 2023-06-08 DIAGNOSIS — I119 Hypertensive heart disease without heart failure: Secondary | ICD-10-CM | POA: Diagnosis not present

## 2023-06-08 DIAGNOSIS — N186 End stage renal disease: Secondary | ICD-10-CM | POA: Diagnosis not present

## 2023-06-08 DIAGNOSIS — D631 Anemia in chronic kidney disease: Secondary | ICD-10-CM | POA: Diagnosis not present

## 2023-06-08 DIAGNOSIS — E1122 Type 2 diabetes mellitus with diabetic chronic kidney disease: Secondary | ICD-10-CM | POA: Diagnosis not present

## 2023-06-08 DIAGNOSIS — Z992 Dependence on renal dialysis: Secondary | ICD-10-CM | POA: Diagnosis not present

## 2023-06-09 DIAGNOSIS — Z992 Dependence on renal dialysis: Secondary | ICD-10-CM | POA: Diagnosis not present

## 2023-06-09 DIAGNOSIS — N2581 Secondary hyperparathyroidism of renal origin: Secondary | ICD-10-CM | POA: Diagnosis not present

## 2023-06-09 DIAGNOSIS — N186 End stage renal disease: Secondary | ICD-10-CM | POA: Diagnosis not present

## 2023-06-11 DIAGNOSIS — E1022 Type 1 diabetes mellitus with diabetic chronic kidney disease: Secondary | ICD-10-CM | POA: Diagnosis not present

## 2023-06-11 DIAGNOSIS — Z992 Dependence on renal dialysis: Secondary | ICD-10-CM | POA: Diagnosis not present

## 2023-06-11 DIAGNOSIS — N2581 Secondary hyperparathyroidism of renal origin: Secondary | ICD-10-CM | POA: Diagnosis not present

## 2023-06-11 DIAGNOSIS — N186 End stage renal disease: Secondary | ICD-10-CM | POA: Diagnosis not present

## 2023-06-13 DIAGNOSIS — N2581 Secondary hyperparathyroidism of renal origin: Secondary | ICD-10-CM | POA: Diagnosis not present

## 2023-06-13 DIAGNOSIS — N186 End stage renal disease: Secondary | ICD-10-CM | POA: Diagnosis not present

## 2023-06-13 DIAGNOSIS — Z992 Dependence on renal dialysis: Secondary | ICD-10-CM | POA: Diagnosis not present

## 2023-06-15 DIAGNOSIS — Z992 Dependence on renal dialysis: Secondary | ICD-10-CM | POA: Diagnosis not present

## 2023-06-15 DIAGNOSIS — N2581 Secondary hyperparathyroidism of renal origin: Secondary | ICD-10-CM | POA: Diagnosis not present

## 2023-06-15 DIAGNOSIS — N186 End stage renal disease: Secondary | ICD-10-CM | POA: Diagnosis not present

## 2023-06-17 DIAGNOSIS — N186 End stage renal disease: Secondary | ICD-10-CM | POA: Diagnosis not present

## 2023-06-17 DIAGNOSIS — Z992 Dependence on renal dialysis: Secondary | ICD-10-CM | POA: Diagnosis not present

## 2023-06-17 DIAGNOSIS — N2581 Secondary hyperparathyroidism of renal origin: Secondary | ICD-10-CM | POA: Diagnosis not present

## 2023-06-20 DIAGNOSIS — N2581 Secondary hyperparathyroidism of renal origin: Secondary | ICD-10-CM | POA: Diagnosis not present

## 2023-06-20 DIAGNOSIS — Z992 Dependence on renal dialysis: Secondary | ICD-10-CM | POA: Diagnosis not present

## 2023-06-20 DIAGNOSIS — N186 End stage renal disease: Secondary | ICD-10-CM | POA: Diagnosis not present

## 2023-06-20 DIAGNOSIS — E1029 Type 1 diabetes mellitus with other diabetic kidney complication: Secondary | ICD-10-CM | POA: Diagnosis not present

## 2023-06-23 DIAGNOSIS — N2581 Secondary hyperparathyroidism of renal origin: Secondary | ICD-10-CM | POA: Diagnosis not present

## 2023-06-23 DIAGNOSIS — Z992 Dependence on renal dialysis: Secondary | ICD-10-CM | POA: Diagnosis not present

## 2023-06-23 DIAGNOSIS — N186 End stage renal disease: Secondary | ICD-10-CM | POA: Diagnosis not present

## 2023-06-25 DIAGNOSIS — N2581 Secondary hyperparathyroidism of renal origin: Secondary | ICD-10-CM | POA: Diagnosis not present

## 2023-06-25 DIAGNOSIS — N186 End stage renal disease: Secondary | ICD-10-CM | POA: Diagnosis not present

## 2023-06-25 DIAGNOSIS — Z992 Dependence on renal dialysis: Secondary | ICD-10-CM | POA: Diagnosis not present

## 2023-06-25 DIAGNOSIS — E1022 Type 1 diabetes mellitus with diabetic chronic kidney disease: Secondary | ICD-10-CM | POA: Diagnosis not present

## 2023-06-25 IMAGING — DX DG CHEST 1V PORT
1 series · 1 of 1 positions shown · non-contrast
Comparison: 01/28/2021

CLINICAL DATA: Evaluate possible pneumonia.

EXAM:
PORTABLE CHEST 1 VIEW

[chest]
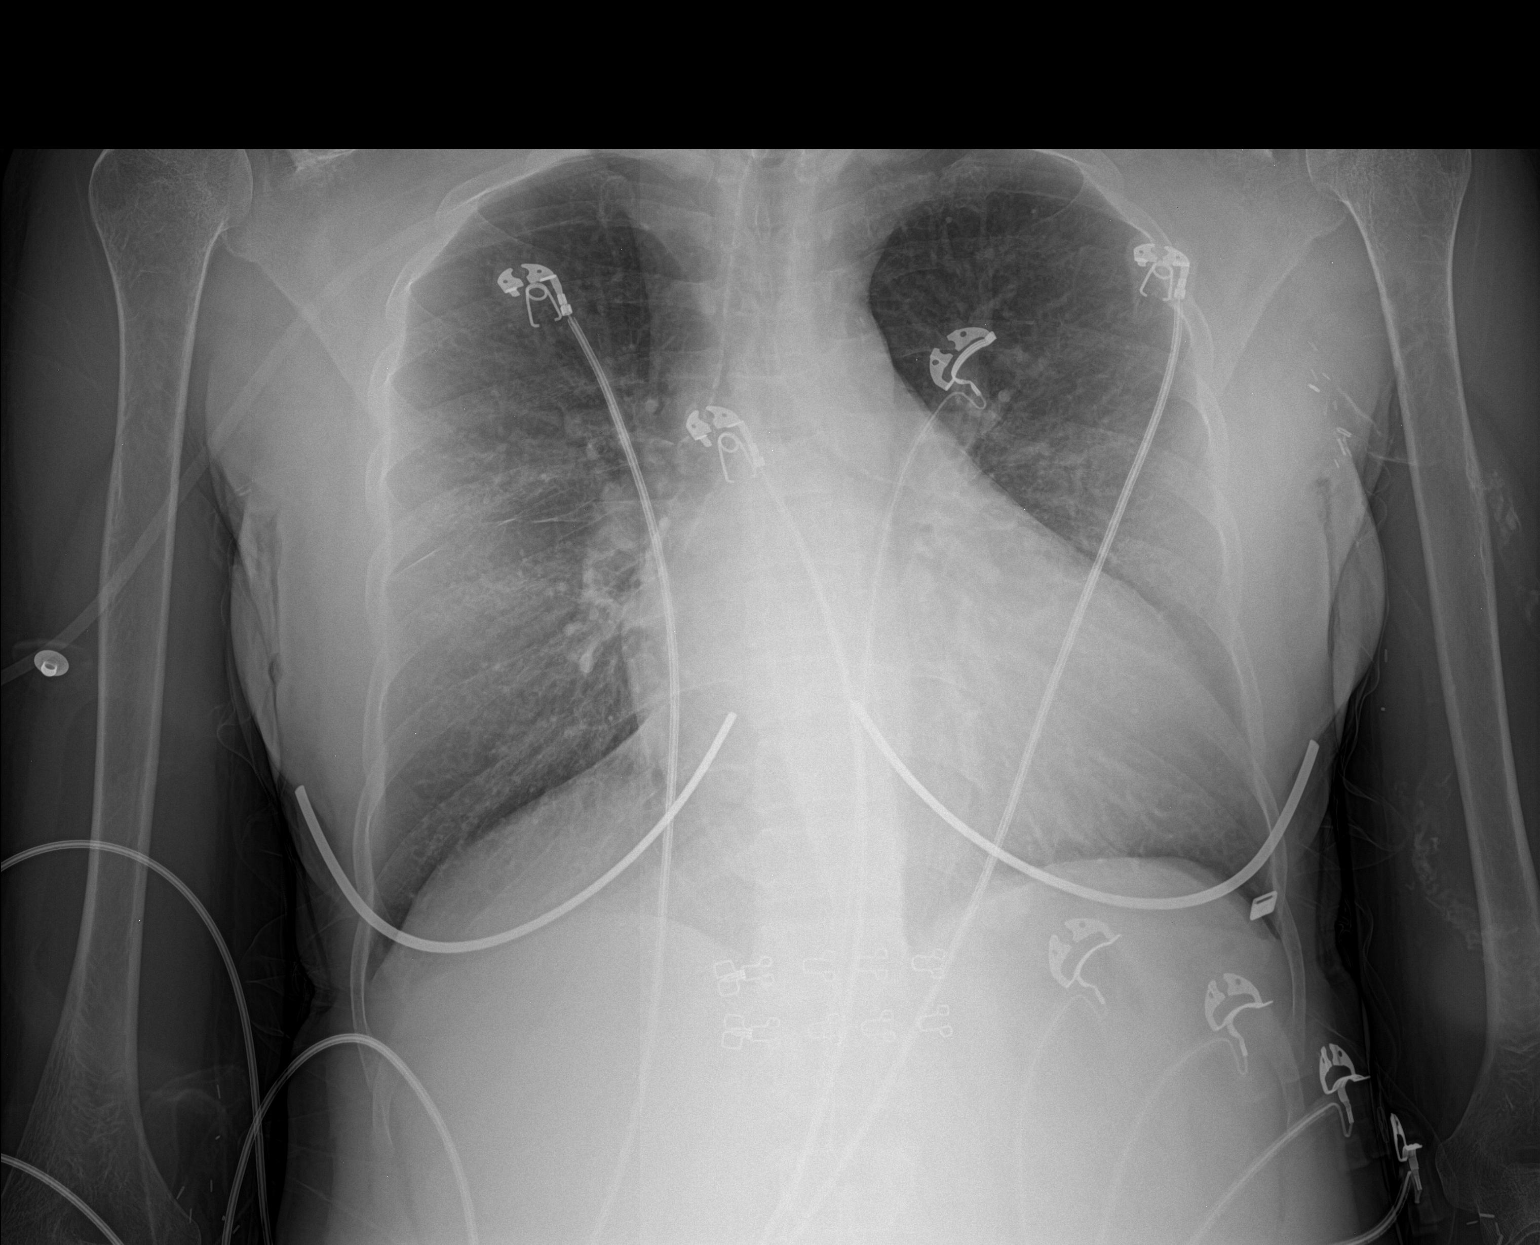

[1 of 1 positions shown; findings below may reference images not displayed]

FINDINGS: Mild cardiac enlargement. Suggestion of central vascular congestion,
new since prior study. No edema or consolidation. No pleural
effusions. No pneumothorax. Mediastinal contours appear intact.
Surgical clips in the left axilla. Vascular calcifications.
IMPRESSION: Cardiac enlargement with mild central vascular congestion. No edema
or consolidation.

## 2023-06-27 DIAGNOSIS — N186 End stage renal disease: Secondary | ICD-10-CM | POA: Diagnosis not present

## 2023-06-27 DIAGNOSIS — N2581 Secondary hyperparathyroidism of renal origin: Secondary | ICD-10-CM | POA: Diagnosis not present

## 2023-06-27 DIAGNOSIS — Z992 Dependence on renal dialysis: Secondary | ICD-10-CM | POA: Diagnosis not present

## 2023-06-29 DIAGNOSIS — N186 End stage renal disease: Secondary | ICD-10-CM | POA: Diagnosis not present

## 2023-06-29 DIAGNOSIS — E1022 Type 1 diabetes mellitus with diabetic chronic kidney disease: Secondary | ICD-10-CM | POA: Diagnosis not present

## 2023-06-29 DIAGNOSIS — Z992 Dependence on renal dialysis: Secondary | ICD-10-CM | POA: Diagnosis not present

## 2023-06-29 DIAGNOSIS — Z9641 Presence of insulin pump (external) (internal): Secondary | ICD-10-CM | POA: Diagnosis not present

## 2023-06-29 DIAGNOSIS — E103593 Type 1 diabetes mellitus with proliferative diabetic retinopathy without macular edema, bilateral: Secondary | ICD-10-CM | POA: Diagnosis not present

## 2023-06-29 DIAGNOSIS — Z978 Presence of other specified devices: Secondary | ICD-10-CM | POA: Diagnosis not present

## 2023-06-30 DIAGNOSIS — N2581 Secondary hyperparathyroidism of renal origin: Secondary | ICD-10-CM | POA: Diagnosis not present

## 2023-06-30 DIAGNOSIS — Z992 Dependence on renal dialysis: Secondary | ICD-10-CM | POA: Diagnosis not present

## 2023-06-30 DIAGNOSIS — N186 End stage renal disease: Secondary | ICD-10-CM | POA: Diagnosis not present

## 2023-07-02 DIAGNOSIS — N2581 Secondary hyperparathyroidism of renal origin: Secondary | ICD-10-CM | POA: Diagnosis not present

## 2023-07-02 DIAGNOSIS — N186 End stage renal disease: Secondary | ICD-10-CM | POA: Diagnosis not present

## 2023-07-02 DIAGNOSIS — Z992 Dependence on renal dialysis: Secondary | ICD-10-CM | POA: Diagnosis not present

## 2023-07-04 DIAGNOSIS — N2581 Secondary hyperparathyroidism of renal origin: Secondary | ICD-10-CM | POA: Diagnosis not present

## 2023-07-04 DIAGNOSIS — N186 End stage renal disease: Secondary | ICD-10-CM | POA: Diagnosis not present

## 2023-07-04 DIAGNOSIS — Z992 Dependence on renal dialysis: Secondary | ICD-10-CM | POA: Diagnosis not present

## 2023-07-07 DIAGNOSIS — Z992 Dependence on renal dialysis: Secondary | ICD-10-CM | POA: Diagnosis not present

## 2023-07-07 DIAGNOSIS — N2581 Secondary hyperparathyroidism of renal origin: Secondary | ICD-10-CM | POA: Diagnosis not present

## 2023-07-07 DIAGNOSIS — N186 End stage renal disease: Secondary | ICD-10-CM | POA: Diagnosis not present

## 2023-07-09 DIAGNOSIS — Z992 Dependence on renal dialysis: Secondary | ICD-10-CM | POA: Diagnosis not present

## 2023-07-09 DIAGNOSIS — N186 End stage renal disease: Secondary | ICD-10-CM | POA: Diagnosis not present

## 2023-07-09 DIAGNOSIS — N2581 Secondary hyperparathyroidism of renal origin: Secondary | ICD-10-CM | POA: Diagnosis not present

## 2023-07-11 DIAGNOSIS — E1022 Type 1 diabetes mellitus with diabetic chronic kidney disease: Secondary | ICD-10-CM | POA: Diagnosis not present

## 2023-07-11 DIAGNOSIS — N186 End stage renal disease: Secondary | ICD-10-CM | POA: Diagnosis not present

## 2023-07-11 DIAGNOSIS — Z992 Dependence on renal dialysis: Secondary | ICD-10-CM | POA: Diagnosis not present

## 2023-07-11 DIAGNOSIS — N2581 Secondary hyperparathyroidism of renal origin: Secondary | ICD-10-CM | POA: Diagnosis not present

## 2023-07-13 DIAGNOSIS — N2581 Secondary hyperparathyroidism of renal origin: Secondary | ICD-10-CM | POA: Diagnosis not present

## 2023-07-13 DIAGNOSIS — Z992 Dependence on renal dialysis: Secondary | ICD-10-CM | POA: Diagnosis not present

## 2023-07-13 DIAGNOSIS — N186 End stage renal disease: Secondary | ICD-10-CM | POA: Diagnosis not present

## 2023-07-16 DIAGNOSIS — Z992 Dependence on renal dialysis: Secondary | ICD-10-CM | POA: Diagnosis not present

## 2023-07-16 DIAGNOSIS — N2581 Secondary hyperparathyroidism of renal origin: Secondary | ICD-10-CM | POA: Diagnosis not present

## 2023-07-16 DIAGNOSIS — N186 End stage renal disease: Secondary | ICD-10-CM | POA: Diagnosis not present

## 2023-07-18 DIAGNOSIS — N2581 Secondary hyperparathyroidism of renal origin: Secondary | ICD-10-CM | POA: Diagnosis not present

## 2023-07-18 DIAGNOSIS — Z992 Dependence on renal dialysis: Secondary | ICD-10-CM | POA: Diagnosis not present

## 2023-07-18 DIAGNOSIS — N186 End stage renal disease: Secondary | ICD-10-CM | POA: Diagnosis not present

## 2023-07-20 DIAGNOSIS — N186 End stage renal disease: Secondary | ICD-10-CM | POA: Diagnosis not present

## 2023-07-20 DIAGNOSIS — Z992 Dependence on renal dialysis: Secondary | ICD-10-CM | POA: Diagnosis not present

## 2023-07-20 DIAGNOSIS — N2581 Secondary hyperparathyroidism of renal origin: Secondary | ICD-10-CM | POA: Diagnosis not present

## 2023-07-21 DIAGNOSIS — Z992 Dependence on renal dialysis: Secondary | ICD-10-CM | POA: Diagnosis not present

## 2023-07-21 DIAGNOSIS — E1029 Type 1 diabetes mellitus with other diabetic kidney complication: Secondary | ICD-10-CM | POA: Diagnosis not present

## 2023-07-21 DIAGNOSIS — E1022 Type 1 diabetes mellitus with diabetic chronic kidney disease: Secondary | ICD-10-CM | POA: Diagnosis not present

## 2023-07-21 DIAGNOSIS — N186 End stage renal disease: Secondary | ICD-10-CM | POA: Diagnosis not present

## 2023-07-23 DIAGNOSIS — R110 Nausea: Secondary | ICD-10-CM | POA: Diagnosis not present

## 2023-07-23 DIAGNOSIS — Z992 Dependence on renal dialysis: Secondary | ICD-10-CM | POA: Diagnosis not present

## 2023-07-23 DIAGNOSIS — E1011 Type 1 diabetes mellitus with ketoacidosis with coma: Secondary | ICD-10-CM | POA: Diagnosis not present

## 2023-07-23 DIAGNOSIS — D631 Anemia in chronic kidney disease: Secondary | ICD-10-CM | POA: Diagnosis not present

## 2023-07-23 DIAGNOSIS — E1065 Type 1 diabetes mellitus with hyperglycemia: Secondary | ICD-10-CM | POA: Diagnosis not present

## 2023-07-23 DIAGNOSIS — E1029 Type 1 diabetes mellitus with other diabetic kidney complication: Secondary | ICD-10-CM | POA: Diagnosis not present

## 2023-07-23 DIAGNOSIS — N2581 Secondary hyperparathyroidism of renal origin: Secondary | ICD-10-CM | POA: Diagnosis not present

## 2023-07-23 DIAGNOSIS — N186 End stage renal disease: Secondary | ICD-10-CM | POA: Diagnosis not present

## 2023-07-23 DIAGNOSIS — I10 Essential (primary) hypertension: Secondary | ICD-10-CM | POA: Diagnosis not present

## 2023-07-23 DIAGNOSIS — D509 Iron deficiency anemia, unspecified: Secondary | ICD-10-CM | POA: Diagnosis not present

## 2023-07-25 DIAGNOSIS — D509 Iron deficiency anemia, unspecified: Secondary | ICD-10-CM | POA: Diagnosis not present

## 2023-07-25 DIAGNOSIS — N186 End stage renal disease: Secondary | ICD-10-CM | POA: Diagnosis not present

## 2023-07-25 DIAGNOSIS — E1011 Type 1 diabetes mellitus with ketoacidosis with coma: Secondary | ICD-10-CM | POA: Diagnosis not present

## 2023-07-25 DIAGNOSIS — Z992 Dependence on renal dialysis: Secondary | ICD-10-CM | POA: Diagnosis not present

## 2023-07-25 DIAGNOSIS — I10 Essential (primary) hypertension: Secondary | ICD-10-CM | POA: Diagnosis not present

## 2023-07-25 DIAGNOSIS — N2581 Secondary hyperparathyroidism of renal origin: Secondary | ICD-10-CM | POA: Diagnosis not present

## 2023-07-25 DIAGNOSIS — E1065 Type 1 diabetes mellitus with hyperglycemia: Secondary | ICD-10-CM | POA: Diagnosis not present

## 2023-07-25 DIAGNOSIS — E1029 Type 1 diabetes mellitus with other diabetic kidney complication: Secondary | ICD-10-CM | POA: Diagnosis not present

## 2023-07-25 DIAGNOSIS — R110 Nausea: Secondary | ICD-10-CM | POA: Diagnosis not present

## 2023-07-25 DIAGNOSIS — D631 Anemia in chronic kidney disease: Secondary | ICD-10-CM | POA: Diagnosis not present

## 2023-07-28 DIAGNOSIS — E1029 Type 1 diabetes mellitus with other diabetic kidney complication: Secondary | ICD-10-CM | POA: Diagnosis not present

## 2023-07-28 DIAGNOSIS — D631 Anemia in chronic kidney disease: Secondary | ICD-10-CM | POA: Diagnosis not present

## 2023-07-28 DIAGNOSIS — D509 Iron deficiency anemia, unspecified: Secondary | ICD-10-CM | POA: Diagnosis not present

## 2023-07-28 DIAGNOSIS — E1065 Type 1 diabetes mellitus with hyperglycemia: Secondary | ICD-10-CM | POA: Diagnosis not present

## 2023-07-28 DIAGNOSIS — I10 Essential (primary) hypertension: Secondary | ICD-10-CM | POA: Diagnosis not present

## 2023-07-28 DIAGNOSIS — R110 Nausea: Secondary | ICD-10-CM | POA: Diagnosis not present

## 2023-07-28 DIAGNOSIS — E1011 Type 1 diabetes mellitus with ketoacidosis with coma: Secondary | ICD-10-CM | POA: Diagnosis not present

## 2023-07-28 DIAGNOSIS — N186 End stage renal disease: Secondary | ICD-10-CM | POA: Diagnosis not present

## 2023-07-28 DIAGNOSIS — Z992 Dependence on renal dialysis: Secondary | ICD-10-CM | POA: Diagnosis not present

## 2023-07-28 DIAGNOSIS — N2581 Secondary hyperparathyroidism of renal origin: Secondary | ICD-10-CM | POA: Diagnosis not present

## 2023-07-30 DIAGNOSIS — E1065 Type 1 diabetes mellitus with hyperglycemia: Secondary | ICD-10-CM | POA: Diagnosis not present

## 2023-07-30 DIAGNOSIS — N2581 Secondary hyperparathyroidism of renal origin: Secondary | ICD-10-CM | POA: Diagnosis not present

## 2023-07-30 DIAGNOSIS — E1011 Type 1 diabetes mellitus with ketoacidosis with coma: Secondary | ICD-10-CM | POA: Diagnosis not present

## 2023-07-30 DIAGNOSIS — I10 Essential (primary) hypertension: Secondary | ICD-10-CM | POA: Diagnosis not present

## 2023-07-30 DIAGNOSIS — E1029 Type 1 diabetes mellitus with other diabetic kidney complication: Secondary | ICD-10-CM | POA: Diagnosis not present

## 2023-07-30 DIAGNOSIS — Z992 Dependence on renal dialysis: Secondary | ICD-10-CM | POA: Diagnosis not present

## 2023-07-30 DIAGNOSIS — D631 Anemia in chronic kidney disease: Secondary | ICD-10-CM | POA: Diagnosis not present

## 2023-07-30 DIAGNOSIS — R110 Nausea: Secondary | ICD-10-CM | POA: Diagnosis not present

## 2023-07-30 DIAGNOSIS — D509 Iron deficiency anemia, unspecified: Secondary | ICD-10-CM | POA: Diagnosis not present

## 2023-07-30 DIAGNOSIS — N186 End stage renal disease: Secondary | ICD-10-CM | POA: Diagnosis not present

## 2023-07-31 DIAGNOSIS — N2581 Secondary hyperparathyroidism of renal origin: Secondary | ICD-10-CM | POA: Diagnosis not present

## 2023-07-31 DIAGNOSIS — E8770 Fluid overload, unspecified: Secondary | ICD-10-CM | POA: Diagnosis not present

## 2023-07-31 DIAGNOSIS — Z992 Dependence on renal dialysis: Secondary | ICD-10-CM | POA: Diagnosis not present

## 2023-07-31 DIAGNOSIS — N186 End stage renal disease: Secondary | ICD-10-CM | POA: Diagnosis not present

## 2023-08-01 DIAGNOSIS — N2581 Secondary hyperparathyroidism of renal origin: Secondary | ICD-10-CM | POA: Diagnosis not present

## 2023-08-01 DIAGNOSIS — D509 Iron deficiency anemia, unspecified: Secondary | ICD-10-CM | POA: Diagnosis not present

## 2023-08-01 DIAGNOSIS — D631 Anemia in chronic kidney disease: Secondary | ICD-10-CM | POA: Diagnosis not present

## 2023-08-01 DIAGNOSIS — E1029 Type 1 diabetes mellitus with other diabetic kidney complication: Secondary | ICD-10-CM | POA: Diagnosis not present

## 2023-08-01 DIAGNOSIS — R110 Nausea: Secondary | ICD-10-CM | POA: Diagnosis not present

## 2023-08-01 DIAGNOSIS — Z992 Dependence on renal dialysis: Secondary | ICD-10-CM | POA: Diagnosis not present

## 2023-08-01 DIAGNOSIS — N186 End stage renal disease: Secondary | ICD-10-CM | POA: Diagnosis not present

## 2023-08-01 DIAGNOSIS — E1065 Type 1 diabetes mellitus with hyperglycemia: Secondary | ICD-10-CM | POA: Diagnosis not present

## 2023-08-01 DIAGNOSIS — I10 Essential (primary) hypertension: Secondary | ICD-10-CM | POA: Diagnosis not present

## 2023-08-01 DIAGNOSIS — E1011 Type 1 diabetes mellitus with ketoacidosis with coma: Secondary | ICD-10-CM | POA: Diagnosis not present

## 2023-08-04 DIAGNOSIS — N186 End stage renal disease: Secondary | ICD-10-CM | POA: Diagnosis not present

## 2023-08-04 DIAGNOSIS — N2581 Secondary hyperparathyroidism of renal origin: Secondary | ICD-10-CM | POA: Diagnosis not present

## 2023-08-04 DIAGNOSIS — R110 Nausea: Secondary | ICD-10-CM | POA: Diagnosis not present

## 2023-08-04 DIAGNOSIS — D509 Iron deficiency anemia, unspecified: Secondary | ICD-10-CM | POA: Diagnosis not present

## 2023-08-04 DIAGNOSIS — E1011 Type 1 diabetes mellitus with ketoacidosis with coma: Secondary | ICD-10-CM | POA: Diagnosis not present

## 2023-08-04 DIAGNOSIS — E1029 Type 1 diabetes mellitus with other diabetic kidney complication: Secondary | ICD-10-CM | POA: Diagnosis not present

## 2023-08-04 DIAGNOSIS — Z992 Dependence on renal dialysis: Secondary | ICD-10-CM | POA: Diagnosis not present

## 2023-08-04 DIAGNOSIS — I10 Essential (primary) hypertension: Secondary | ICD-10-CM | POA: Diagnosis not present

## 2023-08-04 DIAGNOSIS — E1065 Type 1 diabetes mellitus with hyperglycemia: Secondary | ICD-10-CM | POA: Diagnosis not present

## 2023-08-04 DIAGNOSIS — D631 Anemia in chronic kidney disease: Secondary | ICD-10-CM | POA: Diagnosis not present

## 2023-08-04 DIAGNOSIS — E1022 Type 1 diabetes mellitus with diabetic chronic kidney disease: Secondary | ICD-10-CM | POA: Diagnosis not present

## 2023-08-06 DIAGNOSIS — E1065 Type 1 diabetes mellitus with hyperglycemia: Secondary | ICD-10-CM | POA: Diagnosis not present

## 2023-08-06 DIAGNOSIS — D509 Iron deficiency anemia, unspecified: Secondary | ICD-10-CM | POA: Diagnosis not present

## 2023-08-06 DIAGNOSIS — E1029 Type 1 diabetes mellitus with other diabetic kidney complication: Secondary | ICD-10-CM | POA: Diagnosis not present

## 2023-08-06 DIAGNOSIS — I10 Essential (primary) hypertension: Secondary | ICD-10-CM | POA: Diagnosis not present

## 2023-08-06 DIAGNOSIS — N186 End stage renal disease: Secondary | ICD-10-CM | POA: Diagnosis not present

## 2023-08-06 DIAGNOSIS — N2581 Secondary hyperparathyroidism of renal origin: Secondary | ICD-10-CM | POA: Diagnosis not present

## 2023-08-06 DIAGNOSIS — D631 Anemia in chronic kidney disease: Secondary | ICD-10-CM | POA: Diagnosis not present

## 2023-08-06 DIAGNOSIS — Z992 Dependence on renal dialysis: Secondary | ICD-10-CM | POA: Diagnosis not present

## 2023-08-06 DIAGNOSIS — R110 Nausea: Secondary | ICD-10-CM | POA: Diagnosis not present

## 2023-08-06 DIAGNOSIS — E1011 Type 1 diabetes mellitus with ketoacidosis with coma: Secondary | ICD-10-CM | POA: Diagnosis not present

## 2023-08-08 DIAGNOSIS — E1029 Type 1 diabetes mellitus with other diabetic kidney complication: Secondary | ICD-10-CM | POA: Diagnosis not present

## 2023-08-08 DIAGNOSIS — D631 Anemia in chronic kidney disease: Secondary | ICD-10-CM | POA: Diagnosis not present

## 2023-08-08 DIAGNOSIS — I10 Essential (primary) hypertension: Secondary | ICD-10-CM | POA: Diagnosis not present

## 2023-08-08 DIAGNOSIS — E1065 Type 1 diabetes mellitus with hyperglycemia: Secondary | ICD-10-CM | POA: Diagnosis not present

## 2023-08-08 DIAGNOSIS — E1011 Type 1 diabetes mellitus with ketoacidosis with coma: Secondary | ICD-10-CM | POA: Diagnosis not present

## 2023-08-08 DIAGNOSIS — Z992 Dependence on renal dialysis: Secondary | ICD-10-CM | POA: Diagnosis not present

## 2023-08-08 DIAGNOSIS — N186 End stage renal disease: Secondary | ICD-10-CM | POA: Diagnosis not present

## 2023-08-08 DIAGNOSIS — D509 Iron deficiency anemia, unspecified: Secondary | ICD-10-CM | POA: Diagnosis not present

## 2023-08-08 DIAGNOSIS — R110 Nausea: Secondary | ICD-10-CM | POA: Diagnosis not present

## 2023-08-08 DIAGNOSIS — N2581 Secondary hyperparathyroidism of renal origin: Secondary | ICD-10-CM | POA: Diagnosis not present

## 2023-08-11 DIAGNOSIS — D631 Anemia in chronic kidney disease: Secondary | ICD-10-CM | POA: Diagnosis not present

## 2023-08-11 DIAGNOSIS — N2581 Secondary hyperparathyroidism of renal origin: Secondary | ICD-10-CM | POA: Diagnosis not present

## 2023-08-11 DIAGNOSIS — D509 Iron deficiency anemia, unspecified: Secondary | ICD-10-CM | POA: Diagnosis not present

## 2023-08-11 DIAGNOSIS — E1011 Type 1 diabetes mellitus with ketoacidosis with coma: Secondary | ICD-10-CM | POA: Diagnosis not present

## 2023-08-11 DIAGNOSIS — N186 End stage renal disease: Secondary | ICD-10-CM | POA: Diagnosis not present

## 2023-08-11 DIAGNOSIS — E1029 Type 1 diabetes mellitus with other diabetic kidney complication: Secondary | ICD-10-CM | POA: Diagnosis not present

## 2023-08-11 DIAGNOSIS — R110 Nausea: Secondary | ICD-10-CM | POA: Diagnosis not present

## 2023-08-11 DIAGNOSIS — E1065 Type 1 diabetes mellitus with hyperglycemia: Secondary | ICD-10-CM | POA: Diagnosis not present

## 2023-08-11 DIAGNOSIS — I10 Essential (primary) hypertension: Secondary | ICD-10-CM | POA: Diagnosis not present

## 2023-08-11 DIAGNOSIS — Z992 Dependence on renal dialysis: Secondary | ICD-10-CM | POA: Diagnosis not present

## 2023-08-13 DIAGNOSIS — N186 End stage renal disease: Secondary | ICD-10-CM | POA: Diagnosis not present

## 2023-08-13 DIAGNOSIS — D631 Anemia in chronic kidney disease: Secondary | ICD-10-CM | POA: Diagnosis not present

## 2023-08-13 DIAGNOSIS — Z992 Dependence on renal dialysis: Secondary | ICD-10-CM | POA: Diagnosis not present

## 2023-08-13 DIAGNOSIS — N2581 Secondary hyperparathyroidism of renal origin: Secondary | ICD-10-CM | POA: Diagnosis not present

## 2023-08-13 DIAGNOSIS — R110 Nausea: Secondary | ICD-10-CM | POA: Diagnosis not present

## 2023-08-13 DIAGNOSIS — E1065 Type 1 diabetes mellitus with hyperglycemia: Secondary | ICD-10-CM | POA: Diagnosis not present

## 2023-08-13 DIAGNOSIS — E1011 Type 1 diabetes mellitus with ketoacidosis with coma: Secondary | ICD-10-CM | POA: Diagnosis not present

## 2023-08-13 DIAGNOSIS — E1029 Type 1 diabetes mellitus with other diabetic kidney complication: Secondary | ICD-10-CM | POA: Diagnosis not present

## 2023-08-13 DIAGNOSIS — I10 Essential (primary) hypertension: Secondary | ICD-10-CM | POA: Diagnosis not present

## 2023-08-13 DIAGNOSIS — D509 Iron deficiency anemia, unspecified: Secondary | ICD-10-CM | POA: Diagnosis not present

## 2023-08-15 DIAGNOSIS — I10 Essential (primary) hypertension: Secondary | ICD-10-CM | POA: Diagnosis not present

## 2023-08-15 DIAGNOSIS — E1011 Type 1 diabetes mellitus with ketoacidosis with coma: Secondary | ICD-10-CM | POA: Diagnosis not present

## 2023-08-15 DIAGNOSIS — N186 End stage renal disease: Secondary | ICD-10-CM | POA: Diagnosis not present

## 2023-08-15 DIAGNOSIS — E1029 Type 1 diabetes mellitus with other diabetic kidney complication: Secondary | ICD-10-CM | POA: Diagnosis not present

## 2023-08-15 DIAGNOSIS — E1065 Type 1 diabetes mellitus with hyperglycemia: Secondary | ICD-10-CM | POA: Diagnosis not present

## 2023-08-15 DIAGNOSIS — Z992 Dependence on renal dialysis: Secondary | ICD-10-CM | POA: Diagnosis not present

## 2023-08-15 DIAGNOSIS — D509 Iron deficiency anemia, unspecified: Secondary | ICD-10-CM | POA: Diagnosis not present

## 2023-08-15 DIAGNOSIS — R110 Nausea: Secondary | ICD-10-CM | POA: Diagnosis not present

## 2023-08-15 DIAGNOSIS — D631 Anemia in chronic kidney disease: Secondary | ICD-10-CM | POA: Diagnosis not present

## 2023-08-15 DIAGNOSIS — N2581 Secondary hyperparathyroidism of renal origin: Secondary | ICD-10-CM | POA: Diagnosis not present

## 2023-08-17 DIAGNOSIS — Z992 Dependence on renal dialysis: Secondary | ICD-10-CM | POA: Diagnosis not present

## 2023-08-17 DIAGNOSIS — Z794 Long term (current) use of insulin: Secondary | ICD-10-CM | POA: Diagnosis not present

## 2023-08-17 DIAGNOSIS — D631 Anemia in chronic kidney disease: Secondary | ICD-10-CM | POA: Diagnosis not present

## 2023-08-17 DIAGNOSIS — E782 Mixed hyperlipidemia: Secondary | ICD-10-CM | POA: Diagnosis not present

## 2023-08-17 DIAGNOSIS — I119 Hypertensive heart disease without heart failure: Secondary | ICD-10-CM | POA: Diagnosis not present

## 2023-08-17 DIAGNOSIS — N186 End stage renal disease: Secondary | ICD-10-CM | POA: Diagnosis not present

## 2023-08-17 DIAGNOSIS — E1122 Type 2 diabetes mellitus with diabetic chronic kidney disease: Secondary | ICD-10-CM | POA: Diagnosis not present

## 2023-08-18 DIAGNOSIS — E1029 Type 1 diabetes mellitus with other diabetic kidney complication: Secondary | ICD-10-CM | POA: Diagnosis not present

## 2023-08-18 DIAGNOSIS — D631 Anemia in chronic kidney disease: Secondary | ICD-10-CM | POA: Diagnosis not present

## 2023-08-18 DIAGNOSIS — I10 Essential (primary) hypertension: Secondary | ICD-10-CM | POA: Diagnosis not present

## 2023-08-18 DIAGNOSIS — R110 Nausea: Secondary | ICD-10-CM | POA: Diagnosis not present

## 2023-08-18 DIAGNOSIS — N186 End stage renal disease: Secondary | ICD-10-CM | POA: Diagnosis not present

## 2023-08-18 DIAGNOSIS — N2581 Secondary hyperparathyroidism of renal origin: Secondary | ICD-10-CM | POA: Diagnosis not present

## 2023-08-18 DIAGNOSIS — E1011 Type 1 diabetes mellitus with ketoacidosis with coma: Secondary | ICD-10-CM | POA: Diagnosis not present

## 2023-08-18 DIAGNOSIS — D509 Iron deficiency anemia, unspecified: Secondary | ICD-10-CM | POA: Diagnosis not present

## 2023-08-18 DIAGNOSIS — E1065 Type 1 diabetes mellitus with hyperglycemia: Secondary | ICD-10-CM | POA: Diagnosis not present

## 2023-08-18 DIAGNOSIS — Z992 Dependence on renal dialysis: Secondary | ICD-10-CM | POA: Diagnosis not present

## 2023-08-20 DIAGNOSIS — N186 End stage renal disease: Secondary | ICD-10-CM | POA: Diagnosis not present

## 2023-08-20 DIAGNOSIS — E1029 Type 1 diabetes mellitus with other diabetic kidney complication: Secondary | ICD-10-CM | POA: Diagnosis not present

## 2023-08-20 DIAGNOSIS — E1065 Type 1 diabetes mellitus with hyperglycemia: Secondary | ICD-10-CM | POA: Diagnosis not present

## 2023-08-20 DIAGNOSIS — D631 Anemia in chronic kidney disease: Secondary | ICD-10-CM | POA: Diagnosis not present

## 2023-08-20 DIAGNOSIS — D509 Iron deficiency anemia, unspecified: Secondary | ICD-10-CM | POA: Diagnosis not present

## 2023-08-20 DIAGNOSIS — R110 Nausea: Secondary | ICD-10-CM | POA: Diagnosis not present

## 2023-08-20 DIAGNOSIS — I10 Essential (primary) hypertension: Secondary | ICD-10-CM | POA: Diagnosis not present

## 2023-08-20 DIAGNOSIS — N2581 Secondary hyperparathyroidism of renal origin: Secondary | ICD-10-CM | POA: Diagnosis not present

## 2023-08-20 DIAGNOSIS — Z992 Dependence on renal dialysis: Secondary | ICD-10-CM | POA: Diagnosis not present

## 2023-08-20 DIAGNOSIS — E1011 Type 1 diabetes mellitus with ketoacidosis with coma: Secondary | ICD-10-CM | POA: Diagnosis not present

## 2023-08-21 DIAGNOSIS — N186 End stage renal disease: Secondary | ICD-10-CM | POA: Diagnosis not present

## 2023-08-21 DIAGNOSIS — E1029 Type 1 diabetes mellitus with other diabetic kidney complication: Secondary | ICD-10-CM | POA: Diagnosis not present

## 2023-08-21 DIAGNOSIS — Z992 Dependence on renal dialysis: Secondary | ICD-10-CM | POA: Diagnosis not present

## 2023-08-22 DIAGNOSIS — D631 Anemia in chronic kidney disease: Secondary | ICD-10-CM | POA: Diagnosis not present

## 2023-08-22 DIAGNOSIS — E1065 Type 1 diabetes mellitus with hyperglycemia: Secondary | ICD-10-CM | POA: Diagnosis not present

## 2023-08-22 DIAGNOSIS — R110 Nausea: Secondary | ICD-10-CM | POA: Diagnosis not present

## 2023-08-22 DIAGNOSIS — D509 Iron deficiency anemia, unspecified: Secondary | ICD-10-CM | POA: Diagnosis not present

## 2023-08-22 DIAGNOSIS — N2581 Secondary hyperparathyroidism of renal origin: Secondary | ICD-10-CM | POA: Diagnosis not present

## 2023-08-22 DIAGNOSIS — E1029 Type 1 diabetes mellitus with other diabetic kidney complication: Secondary | ICD-10-CM | POA: Diagnosis not present

## 2023-08-22 DIAGNOSIS — N186 End stage renal disease: Secondary | ICD-10-CM | POA: Diagnosis not present

## 2023-08-22 DIAGNOSIS — Z992 Dependence on renal dialysis: Secondary | ICD-10-CM | POA: Diagnosis not present

## 2023-08-22 DIAGNOSIS — E1011 Type 1 diabetes mellitus with ketoacidosis with coma: Secondary | ICD-10-CM | POA: Diagnosis not present

## 2023-08-25 DIAGNOSIS — N2581 Secondary hyperparathyroidism of renal origin: Secondary | ICD-10-CM | POA: Diagnosis not present

## 2023-08-25 DIAGNOSIS — Z992 Dependence on renal dialysis: Secondary | ICD-10-CM | POA: Diagnosis not present

## 2023-08-25 DIAGNOSIS — E1065 Type 1 diabetes mellitus with hyperglycemia: Secondary | ICD-10-CM | POA: Diagnosis not present

## 2023-08-25 DIAGNOSIS — N186 End stage renal disease: Secondary | ICD-10-CM | POA: Diagnosis not present

## 2023-08-25 DIAGNOSIS — R110 Nausea: Secondary | ICD-10-CM | POA: Diagnosis not present

## 2023-08-25 DIAGNOSIS — D509 Iron deficiency anemia, unspecified: Secondary | ICD-10-CM | POA: Diagnosis not present

## 2023-08-25 DIAGNOSIS — E1029 Type 1 diabetes mellitus with other diabetic kidney complication: Secondary | ICD-10-CM | POA: Diagnosis not present

## 2023-08-25 DIAGNOSIS — E1011 Type 1 diabetes mellitus with ketoacidosis with coma: Secondary | ICD-10-CM | POA: Diagnosis not present

## 2023-08-25 DIAGNOSIS — D631 Anemia in chronic kidney disease: Secondary | ICD-10-CM | POA: Diagnosis not present

## 2023-08-27 DIAGNOSIS — E1065 Type 1 diabetes mellitus with hyperglycemia: Secondary | ICD-10-CM | POA: Diagnosis not present

## 2023-08-27 DIAGNOSIS — D509 Iron deficiency anemia, unspecified: Secondary | ICD-10-CM | POA: Diagnosis not present

## 2023-08-27 DIAGNOSIS — E1011 Type 1 diabetes mellitus with ketoacidosis with coma: Secondary | ICD-10-CM | POA: Diagnosis not present

## 2023-08-27 DIAGNOSIS — N2581 Secondary hyperparathyroidism of renal origin: Secondary | ICD-10-CM | POA: Diagnosis not present

## 2023-08-27 DIAGNOSIS — D631 Anemia in chronic kidney disease: Secondary | ICD-10-CM | POA: Diagnosis not present

## 2023-08-27 DIAGNOSIS — R110 Nausea: Secondary | ICD-10-CM | POA: Diagnosis not present

## 2023-08-27 DIAGNOSIS — E1029 Type 1 diabetes mellitus with other diabetic kidney complication: Secondary | ICD-10-CM | POA: Diagnosis not present

## 2023-08-27 DIAGNOSIS — N186 End stage renal disease: Secondary | ICD-10-CM | POA: Diagnosis not present

## 2023-08-27 DIAGNOSIS — Z992 Dependence on renal dialysis: Secondary | ICD-10-CM | POA: Diagnosis not present

## 2023-08-29 DIAGNOSIS — Z992 Dependence on renal dialysis: Secondary | ICD-10-CM | POA: Diagnosis not present

## 2023-08-29 DIAGNOSIS — R110 Nausea: Secondary | ICD-10-CM | POA: Diagnosis not present

## 2023-08-29 DIAGNOSIS — E1065 Type 1 diabetes mellitus with hyperglycemia: Secondary | ICD-10-CM | POA: Diagnosis not present

## 2023-08-29 DIAGNOSIS — E1011 Type 1 diabetes mellitus with ketoacidosis with coma: Secondary | ICD-10-CM | POA: Diagnosis not present

## 2023-08-29 DIAGNOSIS — N2581 Secondary hyperparathyroidism of renal origin: Secondary | ICD-10-CM | POA: Diagnosis not present

## 2023-08-29 DIAGNOSIS — D631 Anemia in chronic kidney disease: Secondary | ICD-10-CM | POA: Diagnosis not present

## 2023-08-29 DIAGNOSIS — N186 End stage renal disease: Secondary | ICD-10-CM | POA: Diagnosis not present

## 2023-08-29 DIAGNOSIS — D509 Iron deficiency anemia, unspecified: Secondary | ICD-10-CM | POA: Diagnosis not present

## 2023-08-29 DIAGNOSIS — E1029 Type 1 diabetes mellitus with other diabetic kidney complication: Secondary | ICD-10-CM | POA: Diagnosis not present

## 2023-09-01 DIAGNOSIS — N2581 Secondary hyperparathyroidism of renal origin: Secondary | ICD-10-CM | POA: Diagnosis not present

## 2023-09-01 DIAGNOSIS — E1065 Type 1 diabetes mellitus with hyperglycemia: Secondary | ICD-10-CM | POA: Diagnosis not present

## 2023-09-01 DIAGNOSIS — N186 End stage renal disease: Secondary | ICD-10-CM | POA: Diagnosis not present

## 2023-09-01 DIAGNOSIS — Z992 Dependence on renal dialysis: Secondary | ICD-10-CM | POA: Diagnosis not present

## 2023-09-01 DIAGNOSIS — E1011 Type 1 diabetes mellitus with ketoacidosis with coma: Secondary | ICD-10-CM | POA: Diagnosis not present

## 2023-09-01 DIAGNOSIS — D631 Anemia in chronic kidney disease: Secondary | ICD-10-CM | POA: Diagnosis not present

## 2023-09-01 DIAGNOSIS — D509 Iron deficiency anemia, unspecified: Secondary | ICD-10-CM | POA: Diagnosis not present

## 2023-09-01 DIAGNOSIS — R110 Nausea: Secondary | ICD-10-CM | POA: Diagnosis not present

## 2023-09-01 DIAGNOSIS — E1029 Type 1 diabetes mellitus with other diabetic kidney complication: Secondary | ICD-10-CM | POA: Diagnosis not present

## 2023-09-03 DIAGNOSIS — N186 End stage renal disease: Secondary | ICD-10-CM | POA: Diagnosis not present

## 2023-09-03 DIAGNOSIS — Z992 Dependence on renal dialysis: Secondary | ICD-10-CM | POA: Diagnosis not present

## 2023-09-03 DIAGNOSIS — E1065 Type 1 diabetes mellitus with hyperglycemia: Secondary | ICD-10-CM | POA: Diagnosis not present

## 2023-09-03 DIAGNOSIS — R110 Nausea: Secondary | ICD-10-CM | POA: Diagnosis not present

## 2023-09-03 DIAGNOSIS — E1011 Type 1 diabetes mellitus with ketoacidosis with coma: Secondary | ICD-10-CM | POA: Diagnosis not present

## 2023-09-03 DIAGNOSIS — N2581 Secondary hyperparathyroidism of renal origin: Secondary | ICD-10-CM | POA: Diagnosis not present

## 2023-09-03 DIAGNOSIS — D509 Iron deficiency anemia, unspecified: Secondary | ICD-10-CM | POA: Diagnosis not present

## 2023-09-03 DIAGNOSIS — D631 Anemia in chronic kidney disease: Secondary | ICD-10-CM | POA: Diagnosis not present

## 2023-09-03 DIAGNOSIS — E1029 Type 1 diabetes mellitus with other diabetic kidney complication: Secondary | ICD-10-CM | POA: Diagnosis not present

## 2023-09-04 DIAGNOSIS — E1022 Type 1 diabetes mellitus with diabetic chronic kidney disease: Secondary | ICD-10-CM | POA: Diagnosis not present

## 2023-09-05 DIAGNOSIS — Z992 Dependence on renal dialysis: Secondary | ICD-10-CM | POA: Diagnosis not present

## 2023-09-05 DIAGNOSIS — D509 Iron deficiency anemia, unspecified: Secondary | ICD-10-CM | POA: Diagnosis not present

## 2023-09-05 DIAGNOSIS — E1011 Type 1 diabetes mellitus with ketoacidosis with coma: Secondary | ICD-10-CM | POA: Diagnosis not present

## 2023-09-05 DIAGNOSIS — E1065 Type 1 diabetes mellitus with hyperglycemia: Secondary | ICD-10-CM | POA: Diagnosis not present

## 2023-09-05 DIAGNOSIS — N186 End stage renal disease: Secondary | ICD-10-CM | POA: Diagnosis not present

## 2023-09-05 DIAGNOSIS — N2581 Secondary hyperparathyroidism of renal origin: Secondary | ICD-10-CM | POA: Diagnosis not present

## 2023-09-05 DIAGNOSIS — E1029 Type 1 diabetes mellitus with other diabetic kidney complication: Secondary | ICD-10-CM | POA: Diagnosis not present

## 2023-09-05 DIAGNOSIS — D631 Anemia in chronic kidney disease: Secondary | ICD-10-CM | POA: Diagnosis not present

## 2023-09-05 DIAGNOSIS — R110 Nausea: Secondary | ICD-10-CM | POA: Diagnosis not present

## 2023-09-08 DIAGNOSIS — N186 End stage renal disease: Secondary | ICD-10-CM | POA: Diagnosis not present

## 2023-09-08 DIAGNOSIS — D631 Anemia in chronic kidney disease: Secondary | ICD-10-CM | POA: Diagnosis not present

## 2023-09-08 DIAGNOSIS — E1065 Type 1 diabetes mellitus with hyperglycemia: Secondary | ICD-10-CM | POA: Diagnosis not present

## 2023-09-08 DIAGNOSIS — E1029 Type 1 diabetes mellitus with other diabetic kidney complication: Secondary | ICD-10-CM | POA: Diagnosis not present

## 2023-09-08 DIAGNOSIS — D509 Iron deficiency anemia, unspecified: Secondary | ICD-10-CM | POA: Diagnosis not present

## 2023-09-08 DIAGNOSIS — N2581 Secondary hyperparathyroidism of renal origin: Secondary | ICD-10-CM | POA: Diagnosis not present

## 2023-09-08 DIAGNOSIS — E1011 Type 1 diabetes mellitus with ketoacidosis with coma: Secondary | ICD-10-CM | POA: Diagnosis not present

## 2023-09-08 DIAGNOSIS — Z992 Dependence on renal dialysis: Secondary | ICD-10-CM | POA: Diagnosis not present

## 2023-09-08 DIAGNOSIS — R110 Nausea: Secondary | ICD-10-CM | POA: Diagnosis not present

## 2023-09-10 DIAGNOSIS — D509 Iron deficiency anemia, unspecified: Secondary | ICD-10-CM | POA: Diagnosis not present

## 2023-09-10 DIAGNOSIS — E1065 Type 1 diabetes mellitus with hyperglycemia: Secondary | ICD-10-CM | POA: Diagnosis not present

## 2023-09-10 DIAGNOSIS — Z992 Dependence on renal dialysis: Secondary | ICD-10-CM | POA: Diagnosis not present

## 2023-09-10 DIAGNOSIS — D631 Anemia in chronic kidney disease: Secondary | ICD-10-CM | POA: Diagnosis not present

## 2023-09-10 DIAGNOSIS — N2581 Secondary hyperparathyroidism of renal origin: Secondary | ICD-10-CM | POA: Diagnosis not present

## 2023-09-10 DIAGNOSIS — N186 End stage renal disease: Secondary | ICD-10-CM | POA: Diagnosis not present

## 2023-09-10 DIAGNOSIS — R110 Nausea: Secondary | ICD-10-CM | POA: Diagnosis not present

## 2023-09-10 DIAGNOSIS — E1011 Type 1 diabetes mellitus with ketoacidosis with coma: Secondary | ICD-10-CM | POA: Diagnosis not present

## 2023-09-10 DIAGNOSIS — E1029 Type 1 diabetes mellitus with other diabetic kidney complication: Secondary | ICD-10-CM | POA: Diagnosis not present

## 2023-09-12 DIAGNOSIS — E1011 Type 1 diabetes mellitus with ketoacidosis with coma: Secondary | ICD-10-CM | POA: Diagnosis not present

## 2023-09-12 DIAGNOSIS — N2581 Secondary hyperparathyroidism of renal origin: Secondary | ICD-10-CM | POA: Diagnosis not present

## 2023-09-12 DIAGNOSIS — D509 Iron deficiency anemia, unspecified: Secondary | ICD-10-CM | POA: Diagnosis not present

## 2023-09-12 DIAGNOSIS — E1065 Type 1 diabetes mellitus with hyperglycemia: Secondary | ICD-10-CM | POA: Diagnosis not present

## 2023-09-12 DIAGNOSIS — Z992 Dependence on renal dialysis: Secondary | ICD-10-CM | POA: Diagnosis not present

## 2023-09-12 DIAGNOSIS — R110 Nausea: Secondary | ICD-10-CM | POA: Diagnosis not present

## 2023-09-12 DIAGNOSIS — N186 End stage renal disease: Secondary | ICD-10-CM | POA: Diagnosis not present

## 2023-09-12 DIAGNOSIS — E1029 Type 1 diabetes mellitus with other diabetic kidney complication: Secondary | ICD-10-CM | POA: Diagnosis not present

## 2023-09-12 DIAGNOSIS — D631 Anemia in chronic kidney disease: Secondary | ICD-10-CM | POA: Diagnosis not present

## 2023-09-15 DIAGNOSIS — Z992 Dependence on renal dialysis: Secondary | ICD-10-CM | POA: Diagnosis not present

## 2023-09-15 DIAGNOSIS — N186 End stage renal disease: Secondary | ICD-10-CM | POA: Diagnosis not present

## 2023-09-15 DIAGNOSIS — E1029 Type 1 diabetes mellitus with other diabetic kidney complication: Secondary | ICD-10-CM | POA: Diagnosis not present

## 2023-09-15 DIAGNOSIS — N2581 Secondary hyperparathyroidism of renal origin: Secondary | ICD-10-CM | POA: Diagnosis not present

## 2023-09-15 DIAGNOSIS — E1011 Type 1 diabetes mellitus with ketoacidosis with coma: Secondary | ICD-10-CM | POA: Diagnosis not present

## 2023-09-15 DIAGNOSIS — E1065 Type 1 diabetes mellitus with hyperglycemia: Secondary | ICD-10-CM | POA: Diagnosis not present

## 2023-09-15 DIAGNOSIS — R110 Nausea: Secondary | ICD-10-CM | POA: Diagnosis not present

## 2023-09-15 DIAGNOSIS — D631 Anemia in chronic kidney disease: Secondary | ICD-10-CM | POA: Diagnosis not present

## 2023-09-15 DIAGNOSIS — D509 Iron deficiency anemia, unspecified: Secondary | ICD-10-CM | POA: Diagnosis not present

## 2023-09-17 DIAGNOSIS — N2581 Secondary hyperparathyroidism of renal origin: Secondary | ICD-10-CM | POA: Diagnosis not present

## 2023-09-17 DIAGNOSIS — R110 Nausea: Secondary | ICD-10-CM | POA: Diagnosis not present

## 2023-09-17 DIAGNOSIS — E1029 Type 1 diabetes mellitus with other diabetic kidney complication: Secondary | ICD-10-CM | POA: Diagnosis not present

## 2023-09-17 DIAGNOSIS — E1065 Type 1 diabetes mellitus with hyperglycemia: Secondary | ICD-10-CM | POA: Diagnosis not present

## 2023-09-17 DIAGNOSIS — Z992 Dependence on renal dialysis: Secondary | ICD-10-CM | POA: Diagnosis not present

## 2023-09-17 DIAGNOSIS — N186 End stage renal disease: Secondary | ICD-10-CM | POA: Diagnosis not present

## 2023-09-17 DIAGNOSIS — E1011 Type 1 diabetes mellitus with ketoacidosis with coma: Secondary | ICD-10-CM | POA: Diagnosis not present

## 2023-09-17 DIAGNOSIS — D509 Iron deficiency anemia, unspecified: Secondary | ICD-10-CM | POA: Diagnosis not present

## 2023-09-17 DIAGNOSIS — D631 Anemia in chronic kidney disease: Secondary | ICD-10-CM | POA: Diagnosis not present

## 2023-09-18 DIAGNOSIS — Z992 Dependence on renal dialysis: Secondary | ICD-10-CM | POA: Diagnosis not present

## 2023-09-18 DIAGNOSIS — N186 End stage renal disease: Secondary | ICD-10-CM | POA: Diagnosis not present

## 2023-09-18 DIAGNOSIS — E1029 Type 1 diabetes mellitus with other diabetic kidney complication: Secondary | ICD-10-CM | POA: Diagnosis not present

## 2023-09-19 DIAGNOSIS — E1029 Type 1 diabetes mellitus with other diabetic kidney complication: Secondary | ICD-10-CM | POA: Diagnosis not present

## 2023-09-19 DIAGNOSIS — E1011 Type 1 diabetes mellitus with ketoacidosis with coma: Secondary | ICD-10-CM | POA: Diagnosis not present

## 2023-09-19 DIAGNOSIS — N186 End stage renal disease: Secondary | ICD-10-CM | POA: Diagnosis not present

## 2023-09-19 DIAGNOSIS — Z992 Dependence on renal dialysis: Secondary | ICD-10-CM | POA: Diagnosis not present

## 2023-09-19 DIAGNOSIS — N2581 Secondary hyperparathyroidism of renal origin: Secondary | ICD-10-CM | POA: Diagnosis not present

## 2023-09-19 DIAGNOSIS — D509 Iron deficiency anemia, unspecified: Secondary | ICD-10-CM | POA: Diagnosis not present

## 2023-09-19 DIAGNOSIS — E1065 Type 1 diabetes mellitus with hyperglycemia: Secondary | ICD-10-CM | POA: Diagnosis not present

## 2023-09-22 DIAGNOSIS — Z992 Dependence on renal dialysis: Secondary | ICD-10-CM | POA: Diagnosis not present

## 2023-09-22 DIAGNOSIS — N186 End stage renal disease: Secondary | ICD-10-CM | POA: Diagnosis not present

## 2023-09-22 DIAGNOSIS — E1029 Type 1 diabetes mellitus with other diabetic kidney complication: Secondary | ICD-10-CM | POA: Diagnosis not present

## 2023-09-22 DIAGNOSIS — E1065 Type 1 diabetes mellitus with hyperglycemia: Secondary | ICD-10-CM | POA: Diagnosis not present

## 2023-09-22 DIAGNOSIS — E1011 Type 1 diabetes mellitus with ketoacidosis with coma: Secondary | ICD-10-CM | POA: Diagnosis not present

## 2023-09-22 DIAGNOSIS — D509 Iron deficiency anemia, unspecified: Secondary | ICD-10-CM | POA: Diagnosis not present

## 2023-09-22 DIAGNOSIS — N2581 Secondary hyperparathyroidism of renal origin: Secondary | ICD-10-CM | POA: Diagnosis not present

## 2023-09-24 DIAGNOSIS — Z992 Dependence on renal dialysis: Secondary | ICD-10-CM | POA: Diagnosis not present

## 2023-09-24 DIAGNOSIS — E1029 Type 1 diabetes mellitus with other diabetic kidney complication: Secondary | ICD-10-CM | POA: Diagnosis not present

## 2023-09-24 DIAGNOSIS — N186 End stage renal disease: Secondary | ICD-10-CM | POA: Diagnosis not present

## 2023-09-24 DIAGNOSIS — E1011 Type 1 diabetes mellitus with ketoacidosis with coma: Secondary | ICD-10-CM | POA: Diagnosis not present

## 2023-09-24 DIAGNOSIS — E1065 Type 1 diabetes mellitus with hyperglycemia: Secondary | ICD-10-CM | POA: Diagnosis not present

## 2023-09-24 DIAGNOSIS — N2581 Secondary hyperparathyroidism of renal origin: Secondary | ICD-10-CM | POA: Diagnosis not present

## 2023-09-24 DIAGNOSIS — D509 Iron deficiency anemia, unspecified: Secondary | ICD-10-CM | POA: Diagnosis not present

## 2023-09-26 DIAGNOSIS — N2581 Secondary hyperparathyroidism of renal origin: Secondary | ICD-10-CM | POA: Diagnosis not present

## 2023-09-26 DIAGNOSIS — D509 Iron deficiency anemia, unspecified: Secondary | ICD-10-CM | POA: Diagnosis not present

## 2023-09-26 DIAGNOSIS — Z992 Dependence on renal dialysis: Secondary | ICD-10-CM | POA: Diagnosis not present

## 2023-09-26 DIAGNOSIS — E1065 Type 1 diabetes mellitus with hyperglycemia: Secondary | ICD-10-CM | POA: Diagnosis not present

## 2023-09-26 DIAGNOSIS — E1029 Type 1 diabetes mellitus with other diabetic kidney complication: Secondary | ICD-10-CM | POA: Diagnosis not present

## 2023-09-26 DIAGNOSIS — E1011 Type 1 diabetes mellitus with ketoacidosis with coma: Secondary | ICD-10-CM | POA: Diagnosis not present

## 2023-09-26 DIAGNOSIS — N186 End stage renal disease: Secondary | ICD-10-CM | POA: Diagnosis not present

## 2023-09-29 DIAGNOSIS — D509 Iron deficiency anemia, unspecified: Secondary | ICD-10-CM | POA: Diagnosis not present

## 2023-09-29 DIAGNOSIS — E1011 Type 1 diabetes mellitus with ketoacidosis with coma: Secondary | ICD-10-CM | POA: Diagnosis not present

## 2023-09-29 DIAGNOSIS — N2581 Secondary hyperparathyroidism of renal origin: Secondary | ICD-10-CM | POA: Diagnosis not present

## 2023-09-29 DIAGNOSIS — E1065 Type 1 diabetes mellitus with hyperglycemia: Secondary | ICD-10-CM | POA: Diagnosis not present

## 2023-09-29 DIAGNOSIS — Z992 Dependence on renal dialysis: Secondary | ICD-10-CM | POA: Diagnosis not present

## 2023-09-29 DIAGNOSIS — N186 End stage renal disease: Secondary | ICD-10-CM | POA: Diagnosis not present

## 2023-09-29 DIAGNOSIS — E1029 Type 1 diabetes mellitus with other diabetic kidney complication: Secondary | ICD-10-CM | POA: Diagnosis not present

## 2023-09-30 DIAGNOSIS — Z992 Dependence on renal dialysis: Secondary | ICD-10-CM | POA: Diagnosis not present

## 2023-09-30 DIAGNOSIS — E103593 Type 1 diabetes mellitus with proliferative diabetic retinopathy without macular edema, bilateral: Secondary | ICD-10-CM | POA: Diagnosis not present

## 2023-09-30 DIAGNOSIS — N186 End stage renal disease: Secondary | ICD-10-CM | POA: Diagnosis not present

## 2023-09-30 DIAGNOSIS — Z9641 Presence of insulin pump (external) (internal): Secondary | ICD-10-CM | POA: Diagnosis not present

## 2023-09-30 DIAGNOSIS — E1022 Type 1 diabetes mellitus with diabetic chronic kidney disease: Secondary | ICD-10-CM | POA: Diagnosis not present

## 2023-09-30 DIAGNOSIS — Z978 Presence of other specified devices: Secondary | ICD-10-CM | POA: Diagnosis not present

## 2023-10-01 DIAGNOSIS — E1029 Type 1 diabetes mellitus with other diabetic kidney complication: Secondary | ICD-10-CM | POA: Diagnosis not present

## 2023-10-01 DIAGNOSIS — E1065 Type 1 diabetes mellitus with hyperglycemia: Secondary | ICD-10-CM | POA: Diagnosis not present

## 2023-10-01 DIAGNOSIS — N2581 Secondary hyperparathyroidism of renal origin: Secondary | ICD-10-CM | POA: Diagnosis not present

## 2023-10-01 DIAGNOSIS — Z992 Dependence on renal dialysis: Secondary | ICD-10-CM | POA: Diagnosis not present

## 2023-10-01 DIAGNOSIS — N186 End stage renal disease: Secondary | ICD-10-CM | POA: Diagnosis not present

## 2023-10-01 DIAGNOSIS — E1011 Type 1 diabetes mellitus with ketoacidosis with coma: Secondary | ICD-10-CM | POA: Diagnosis not present

## 2023-10-01 DIAGNOSIS — D509 Iron deficiency anemia, unspecified: Secondary | ICD-10-CM | POA: Diagnosis not present

## 2023-10-03 DIAGNOSIS — N186 End stage renal disease: Secondary | ICD-10-CM | POA: Diagnosis not present

## 2023-10-03 DIAGNOSIS — E1065 Type 1 diabetes mellitus with hyperglycemia: Secondary | ICD-10-CM | POA: Diagnosis not present

## 2023-10-03 DIAGNOSIS — E1011 Type 1 diabetes mellitus with ketoacidosis with coma: Secondary | ICD-10-CM | POA: Diagnosis not present

## 2023-10-03 DIAGNOSIS — Z992 Dependence on renal dialysis: Secondary | ICD-10-CM | POA: Diagnosis not present

## 2023-10-03 DIAGNOSIS — N2581 Secondary hyperparathyroidism of renal origin: Secondary | ICD-10-CM | POA: Diagnosis not present

## 2023-10-03 DIAGNOSIS — E1029 Type 1 diabetes mellitus with other diabetic kidney complication: Secondary | ICD-10-CM | POA: Diagnosis not present

## 2023-10-03 DIAGNOSIS — D509 Iron deficiency anemia, unspecified: Secondary | ICD-10-CM | POA: Diagnosis not present

## 2023-10-05 DIAGNOSIS — E1022 Type 1 diabetes mellitus with diabetic chronic kidney disease: Secondary | ICD-10-CM | POA: Diagnosis not present

## 2023-10-06 DIAGNOSIS — D509 Iron deficiency anemia, unspecified: Secondary | ICD-10-CM | POA: Diagnosis not present

## 2023-10-06 DIAGNOSIS — E1011 Type 1 diabetes mellitus with ketoacidosis with coma: Secondary | ICD-10-CM | POA: Diagnosis not present

## 2023-10-06 DIAGNOSIS — N186 End stage renal disease: Secondary | ICD-10-CM | POA: Diagnosis not present

## 2023-10-06 DIAGNOSIS — Z992 Dependence on renal dialysis: Secondary | ICD-10-CM | POA: Diagnosis not present

## 2023-10-06 DIAGNOSIS — E1029 Type 1 diabetes mellitus with other diabetic kidney complication: Secondary | ICD-10-CM | POA: Diagnosis not present

## 2023-10-06 DIAGNOSIS — N2581 Secondary hyperparathyroidism of renal origin: Secondary | ICD-10-CM | POA: Diagnosis not present

## 2023-10-06 DIAGNOSIS — E1065 Type 1 diabetes mellitus with hyperglycemia: Secondary | ICD-10-CM | POA: Diagnosis not present

## 2023-10-08 DIAGNOSIS — Z992 Dependence on renal dialysis: Secondary | ICD-10-CM | POA: Diagnosis not present

## 2023-10-08 DIAGNOSIS — E1011 Type 1 diabetes mellitus with ketoacidosis with coma: Secondary | ICD-10-CM | POA: Diagnosis not present

## 2023-10-08 DIAGNOSIS — E1065 Type 1 diabetes mellitus with hyperglycemia: Secondary | ICD-10-CM | POA: Diagnosis not present

## 2023-10-08 DIAGNOSIS — D509 Iron deficiency anemia, unspecified: Secondary | ICD-10-CM | POA: Diagnosis not present

## 2023-10-08 DIAGNOSIS — N186 End stage renal disease: Secondary | ICD-10-CM | POA: Diagnosis not present

## 2023-10-08 DIAGNOSIS — N2581 Secondary hyperparathyroidism of renal origin: Secondary | ICD-10-CM | POA: Diagnosis not present

## 2023-10-08 DIAGNOSIS — E1029 Type 1 diabetes mellitus with other diabetic kidney complication: Secondary | ICD-10-CM | POA: Diagnosis not present

## 2023-10-10 DIAGNOSIS — E1065 Type 1 diabetes mellitus with hyperglycemia: Secondary | ICD-10-CM | POA: Diagnosis not present

## 2023-10-10 DIAGNOSIS — E1029 Type 1 diabetes mellitus with other diabetic kidney complication: Secondary | ICD-10-CM | POA: Diagnosis not present

## 2023-10-10 DIAGNOSIS — D509 Iron deficiency anemia, unspecified: Secondary | ICD-10-CM | POA: Diagnosis not present

## 2023-10-10 DIAGNOSIS — N2581 Secondary hyperparathyroidism of renal origin: Secondary | ICD-10-CM | POA: Diagnosis not present

## 2023-10-10 DIAGNOSIS — Z992 Dependence on renal dialysis: Secondary | ICD-10-CM | POA: Diagnosis not present

## 2023-10-10 DIAGNOSIS — E1011 Type 1 diabetes mellitus with ketoacidosis with coma: Secondary | ICD-10-CM | POA: Diagnosis not present

## 2023-10-10 DIAGNOSIS — N186 End stage renal disease: Secondary | ICD-10-CM | POA: Diagnosis not present

## 2023-10-13 DIAGNOSIS — E1029 Type 1 diabetes mellitus with other diabetic kidney complication: Secondary | ICD-10-CM | POA: Diagnosis not present

## 2023-10-13 DIAGNOSIS — Z992 Dependence on renal dialysis: Secondary | ICD-10-CM | POA: Diagnosis not present

## 2023-10-13 DIAGNOSIS — N2581 Secondary hyperparathyroidism of renal origin: Secondary | ICD-10-CM | POA: Diagnosis not present

## 2023-10-13 DIAGNOSIS — E1011 Type 1 diabetes mellitus with ketoacidosis with coma: Secondary | ICD-10-CM | POA: Diagnosis not present

## 2023-10-13 DIAGNOSIS — N186 End stage renal disease: Secondary | ICD-10-CM | POA: Diagnosis not present

## 2023-10-13 DIAGNOSIS — D509 Iron deficiency anemia, unspecified: Secondary | ICD-10-CM | POA: Diagnosis not present

## 2023-10-13 DIAGNOSIS — E1065 Type 1 diabetes mellitus with hyperglycemia: Secondary | ICD-10-CM | POA: Diagnosis not present

## 2023-10-15 DIAGNOSIS — E1011 Type 1 diabetes mellitus with ketoacidosis with coma: Secondary | ICD-10-CM | POA: Diagnosis not present

## 2023-10-15 DIAGNOSIS — N2581 Secondary hyperparathyroidism of renal origin: Secondary | ICD-10-CM | POA: Diagnosis not present

## 2023-10-15 DIAGNOSIS — D509 Iron deficiency anemia, unspecified: Secondary | ICD-10-CM | POA: Diagnosis not present

## 2023-10-15 DIAGNOSIS — E1029 Type 1 diabetes mellitus with other diabetic kidney complication: Secondary | ICD-10-CM | POA: Diagnosis not present

## 2023-10-15 DIAGNOSIS — N186 End stage renal disease: Secondary | ICD-10-CM | POA: Diagnosis not present

## 2023-10-15 DIAGNOSIS — Z992 Dependence on renal dialysis: Secondary | ICD-10-CM | POA: Diagnosis not present

## 2023-10-15 DIAGNOSIS — E1065 Type 1 diabetes mellitus with hyperglycemia: Secondary | ICD-10-CM | POA: Diagnosis not present

## 2023-10-17 DIAGNOSIS — E1011 Type 1 diabetes mellitus with ketoacidosis with coma: Secondary | ICD-10-CM | POA: Diagnosis not present

## 2023-10-17 DIAGNOSIS — N2581 Secondary hyperparathyroidism of renal origin: Secondary | ICD-10-CM | POA: Diagnosis not present

## 2023-10-17 DIAGNOSIS — E1029 Type 1 diabetes mellitus with other diabetic kidney complication: Secondary | ICD-10-CM | POA: Diagnosis not present

## 2023-10-17 DIAGNOSIS — N186 End stage renal disease: Secondary | ICD-10-CM | POA: Diagnosis not present

## 2023-10-17 DIAGNOSIS — Z992 Dependence on renal dialysis: Secondary | ICD-10-CM | POA: Diagnosis not present

## 2023-10-17 DIAGNOSIS — D509 Iron deficiency anemia, unspecified: Secondary | ICD-10-CM | POA: Diagnosis not present

## 2023-10-17 DIAGNOSIS — E1065 Type 1 diabetes mellitus with hyperglycemia: Secondary | ICD-10-CM | POA: Diagnosis not present

## 2023-10-19 DIAGNOSIS — Z992 Dependence on renal dialysis: Secondary | ICD-10-CM | POA: Diagnosis not present

## 2023-10-19 DIAGNOSIS — E1029 Type 1 diabetes mellitus with other diabetic kidney complication: Secondary | ICD-10-CM | POA: Diagnosis not present

## 2023-10-19 DIAGNOSIS — N186 End stage renal disease: Secondary | ICD-10-CM | POA: Diagnosis not present

## 2023-10-20 DIAGNOSIS — N2581 Secondary hyperparathyroidism of renal origin: Secondary | ICD-10-CM | POA: Diagnosis not present

## 2023-10-20 DIAGNOSIS — N186 End stage renal disease: Secondary | ICD-10-CM | POA: Diagnosis not present

## 2023-10-20 DIAGNOSIS — E1065 Type 1 diabetes mellitus with hyperglycemia: Secondary | ICD-10-CM | POA: Diagnosis not present

## 2023-10-20 DIAGNOSIS — Z992 Dependence on renal dialysis: Secondary | ICD-10-CM | POA: Diagnosis not present

## 2023-10-20 DIAGNOSIS — D631 Anemia in chronic kidney disease: Secondary | ICD-10-CM | POA: Diagnosis not present

## 2023-10-20 DIAGNOSIS — D509 Iron deficiency anemia, unspecified: Secondary | ICD-10-CM | POA: Diagnosis not present

## 2023-10-20 DIAGNOSIS — E1011 Type 1 diabetes mellitus with ketoacidosis with coma: Secondary | ICD-10-CM | POA: Diagnosis not present

## 2023-10-20 DIAGNOSIS — E1029 Type 1 diabetes mellitus with other diabetic kidney complication: Secondary | ICD-10-CM | POA: Diagnosis not present

## 2023-10-22 DIAGNOSIS — E1011 Type 1 diabetes mellitus with ketoacidosis with coma: Secondary | ICD-10-CM | POA: Diagnosis not present

## 2023-10-22 DIAGNOSIS — D509 Iron deficiency anemia, unspecified: Secondary | ICD-10-CM | POA: Diagnosis not present

## 2023-10-22 DIAGNOSIS — Z992 Dependence on renal dialysis: Secondary | ICD-10-CM | POA: Diagnosis not present

## 2023-10-22 DIAGNOSIS — D631 Anemia in chronic kidney disease: Secondary | ICD-10-CM | POA: Diagnosis not present

## 2023-10-22 DIAGNOSIS — E1065 Type 1 diabetes mellitus with hyperglycemia: Secondary | ICD-10-CM | POA: Diagnosis not present

## 2023-10-22 DIAGNOSIS — E1029 Type 1 diabetes mellitus with other diabetic kidney complication: Secondary | ICD-10-CM | POA: Diagnosis not present

## 2023-10-22 DIAGNOSIS — N2581 Secondary hyperparathyroidism of renal origin: Secondary | ICD-10-CM | POA: Diagnosis not present

## 2023-10-22 DIAGNOSIS — N186 End stage renal disease: Secondary | ICD-10-CM | POA: Diagnosis not present

## 2023-10-24 DIAGNOSIS — N186 End stage renal disease: Secondary | ICD-10-CM | POA: Diagnosis not present

## 2023-10-24 DIAGNOSIS — D509 Iron deficiency anemia, unspecified: Secondary | ICD-10-CM | POA: Diagnosis not present

## 2023-10-24 DIAGNOSIS — N2581 Secondary hyperparathyroidism of renal origin: Secondary | ICD-10-CM | POA: Diagnosis not present

## 2023-10-24 DIAGNOSIS — E1029 Type 1 diabetes mellitus with other diabetic kidney complication: Secondary | ICD-10-CM | POA: Diagnosis not present

## 2023-10-24 DIAGNOSIS — D631 Anemia in chronic kidney disease: Secondary | ICD-10-CM | POA: Diagnosis not present

## 2023-10-24 DIAGNOSIS — E1065 Type 1 diabetes mellitus with hyperglycemia: Secondary | ICD-10-CM | POA: Diagnosis not present

## 2023-10-24 DIAGNOSIS — E1011 Type 1 diabetes mellitus with ketoacidosis with coma: Secondary | ICD-10-CM | POA: Diagnosis not present

## 2023-10-24 DIAGNOSIS — Z992 Dependence on renal dialysis: Secondary | ICD-10-CM | POA: Diagnosis not present

## 2023-10-27 DIAGNOSIS — D509 Iron deficiency anemia, unspecified: Secondary | ICD-10-CM | POA: Diagnosis not present

## 2023-10-27 DIAGNOSIS — E1029 Type 1 diabetes mellitus with other diabetic kidney complication: Secondary | ICD-10-CM | POA: Diagnosis not present

## 2023-10-27 DIAGNOSIS — N186 End stage renal disease: Secondary | ICD-10-CM | POA: Diagnosis not present

## 2023-10-27 DIAGNOSIS — E1065 Type 1 diabetes mellitus with hyperglycemia: Secondary | ICD-10-CM | POA: Diagnosis not present

## 2023-10-27 DIAGNOSIS — E1011 Type 1 diabetes mellitus with ketoacidosis with coma: Secondary | ICD-10-CM | POA: Diagnosis not present

## 2023-10-27 DIAGNOSIS — D631 Anemia in chronic kidney disease: Secondary | ICD-10-CM | POA: Diagnosis not present

## 2023-10-27 DIAGNOSIS — Z992 Dependence on renal dialysis: Secondary | ICD-10-CM | POA: Diagnosis not present

## 2023-10-27 DIAGNOSIS — N2581 Secondary hyperparathyroidism of renal origin: Secondary | ICD-10-CM | POA: Diagnosis not present

## 2023-10-29 DIAGNOSIS — E1065 Type 1 diabetes mellitus with hyperglycemia: Secondary | ICD-10-CM | POA: Diagnosis not present

## 2023-10-29 DIAGNOSIS — D631 Anemia in chronic kidney disease: Secondary | ICD-10-CM | POA: Diagnosis not present

## 2023-10-29 DIAGNOSIS — N2581 Secondary hyperparathyroidism of renal origin: Secondary | ICD-10-CM | POA: Diagnosis not present

## 2023-10-29 DIAGNOSIS — N186 End stage renal disease: Secondary | ICD-10-CM | POA: Diagnosis not present

## 2023-10-29 DIAGNOSIS — E1029 Type 1 diabetes mellitus with other diabetic kidney complication: Secondary | ICD-10-CM | POA: Diagnosis not present

## 2023-10-29 DIAGNOSIS — Z992 Dependence on renal dialysis: Secondary | ICD-10-CM | POA: Diagnosis not present

## 2023-10-29 DIAGNOSIS — E1011 Type 1 diabetes mellitus with ketoacidosis with coma: Secondary | ICD-10-CM | POA: Diagnosis not present

## 2023-10-29 DIAGNOSIS — D509 Iron deficiency anemia, unspecified: Secondary | ICD-10-CM | POA: Diagnosis not present

## 2023-10-31 DIAGNOSIS — E1029 Type 1 diabetes mellitus with other diabetic kidney complication: Secondary | ICD-10-CM | POA: Diagnosis not present

## 2023-10-31 DIAGNOSIS — E1011 Type 1 diabetes mellitus with ketoacidosis with coma: Secondary | ICD-10-CM | POA: Diagnosis not present

## 2023-10-31 DIAGNOSIS — Z992 Dependence on renal dialysis: Secondary | ICD-10-CM | POA: Diagnosis not present

## 2023-10-31 DIAGNOSIS — N186 End stage renal disease: Secondary | ICD-10-CM | POA: Diagnosis not present

## 2023-10-31 DIAGNOSIS — D631 Anemia in chronic kidney disease: Secondary | ICD-10-CM | POA: Diagnosis not present

## 2023-10-31 DIAGNOSIS — N2581 Secondary hyperparathyroidism of renal origin: Secondary | ICD-10-CM | POA: Diagnosis not present

## 2023-10-31 DIAGNOSIS — D509 Iron deficiency anemia, unspecified: Secondary | ICD-10-CM | POA: Diagnosis not present

## 2023-10-31 DIAGNOSIS — E1065 Type 1 diabetes mellitus with hyperglycemia: Secondary | ICD-10-CM | POA: Diagnosis not present

## 2023-11-03 DIAGNOSIS — E1029 Type 1 diabetes mellitus with other diabetic kidney complication: Secondary | ICD-10-CM | POA: Diagnosis not present

## 2023-11-03 DIAGNOSIS — N186 End stage renal disease: Secondary | ICD-10-CM | POA: Diagnosis not present

## 2023-11-03 DIAGNOSIS — N2581 Secondary hyperparathyroidism of renal origin: Secondary | ICD-10-CM | POA: Diagnosis not present

## 2023-11-03 DIAGNOSIS — D631 Anemia in chronic kidney disease: Secondary | ICD-10-CM | POA: Diagnosis not present

## 2023-11-03 DIAGNOSIS — D509 Iron deficiency anemia, unspecified: Secondary | ICD-10-CM | POA: Diagnosis not present

## 2023-11-03 DIAGNOSIS — E1011 Type 1 diabetes mellitus with ketoacidosis with coma: Secondary | ICD-10-CM | POA: Diagnosis not present

## 2023-11-03 DIAGNOSIS — Z992 Dependence on renal dialysis: Secondary | ICD-10-CM | POA: Diagnosis not present

## 2023-11-03 DIAGNOSIS — E1065 Type 1 diabetes mellitus with hyperglycemia: Secondary | ICD-10-CM | POA: Diagnosis not present

## 2023-11-05 DIAGNOSIS — E1011 Type 1 diabetes mellitus with ketoacidosis with coma: Secondary | ICD-10-CM | POA: Diagnosis not present

## 2023-11-05 DIAGNOSIS — D631 Anemia in chronic kidney disease: Secondary | ICD-10-CM | POA: Diagnosis not present

## 2023-11-05 DIAGNOSIS — E1065 Type 1 diabetes mellitus with hyperglycemia: Secondary | ICD-10-CM | POA: Diagnosis not present

## 2023-11-05 DIAGNOSIS — N2581 Secondary hyperparathyroidism of renal origin: Secondary | ICD-10-CM | POA: Diagnosis not present

## 2023-11-05 DIAGNOSIS — E1029 Type 1 diabetes mellitus with other diabetic kidney complication: Secondary | ICD-10-CM | POA: Diagnosis not present

## 2023-11-05 DIAGNOSIS — Z992 Dependence on renal dialysis: Secondary | ICD-10-CM | POA: Diagnosis not present

## 2023-11-05 DIAGNOSIS — N186 End stage renal disease: Secondary | ICD-10-CM | POA: Diagnosis not present

## 2023-11-05 DIAGNOSIS — D509 Iron deficiency anemia, unspecified: Secondary | ICD-10-CM | POA: Diagnosis not present

## 2023-11-06 DIAGNOSIS — E1022 Type 1 diabetes mellitus with diabetic chronic kidney disease: Secondary | ICD-10-CM | POA: Diagnosis not present

## 2023-11-07 DIAGNOSIS — Z992 Dependence on renal dialysis: Secondary | ICD-10-CM | POA: Diagnosis not present

## 2023-11-07 DIAGNOSIS — E1011 Type 1 diabetes mellitus with ketoacidosis with coma: Secondary | ICD-10-CM | POA: Diagnosis not present

## 2023-11-07 DIAGNOSIS — E1065 Type 1 diabetes mellitus with hyperglycemia: Secondary | ICD-10-CM | POA: Diagnosis not present

## 2023-11-07 DIAGNOSIS — D631 Anemia in chronic kidney disease: Secondary | ICD-10-CM | POA: Diagnosis not present

## 2023-11-07 DIAGNOSIS — E1029 Type 1 diabetes mellitus with other diabetic kidney complication: Secondary | ICD-10-CM | POA: Diagnosis not present

## 2023-11-07 DIAGNOSIS — N2581 Secondary hyperparathyroidism of renal origin: Secondary | ICD-10-CM | POA: Diagnosis not present

## 2023-11-07 DIAGNOSIS — N186 End stage renal disease: Secondary | ICD-10-CM | POA: Diagnosis not present

## 2023-11-07 DIAGNOSIS — D509 Iron deficiency anemia, unspecified: Secondary | ICD-10-CM | POA: Diagnosis not present

## 2023-11-10 DIAGNOSIS — Z992 Dependence on renal dialysis: Secondary | ICD-10-CM | POA: Diagnosis not present

## 2023-11-10 DIAGNOSIS — D631 Anemia in chronic kidney disease: Secondary | ICD-10-CM | POA: Diagnosis not present

## 2023-11-10 DIAGNOSIS — E1065 Type 1 diabetes mellitus with hyperglycemia: Secondary | ICD-10-CM | POA: Diagnosis not present

## 2023-11-10 DIAGNOSIS — E1029 Type 1 diabetes mellitus with other diabetic kidney complication: Secondary | ICD-10-CM | POA: Diagnosis not present

## 2023-11-10 DIAGNOSIS — N186 End stage renal disease: Secondary | ICD-10-CM | POA: Diagnosis not present

## 2023-11-10 DIAGNOSIS — N2581 Secondary hyperparathyroidism of renal origin: Secondary | ICD-10-CM | POA: Diagnosis not present

## 2023-11-10 DIAGNOSIS — E1011 Type 1 diabetes mellitus with ketoacidosis with coma: Secondary | ICD-10-CM | POA: Diagnosis not present

## 2023-11-10 DIAGNOSIS — D509 Iron deficiency anemia, unspecified: Secondary | ICD-10-CM | POA: Diagnosis not present

## 2023-11-12 DIAGNOSIS — D509 Iron deficiency anemia, unspecified: Secondary | ICD-10-CM | POA: Diagnosis not present

## 2023-11-12 DIAGNOSIS — N2581 Secondary hyperparathyroidism of renal origin: Secondary | ICD-10-CM | POA: Diagnosis not present

## 2023-11-12 DIAGNOSIS — E1011 Type 1 diabetes mellitus with ketoacidosis with coma: Secondary | ICD-10-CM | POA: Diagnosis not present

## 2023-11-12 DIAGNOSIS — D631 Anemia in chronic kidney disease: Secondary | ICD-10-CM | POA: Diagnosis not present

## 2023-11-12 DIAGNOSIS — E1065 Type 1 diabetes mellitus with hyperglycemia: Secondary | ICD-10-CM | POA: Diagnosis not present

## 2023-11-12 DIAGNOSIS — Z992 Dependence on renal dialysis: Secondary | ICD-10-CM | POA: Diagnosis not present

## 2023-11-12 DIAGNOSIS — N186 End stage renal disease: Secondary | ICD-10-CM | POA: Diagnosis not present

## 2023-11-12 DIAGNOSIS — E1029 Type 1 diabetes mellitus with other diabetic kidney complication: Secondary | ICD-10-CM | POA: Diagnosis not present

## 2023-11-14 DIAGNOSIS — E1065 Type 1 diabetes mellitus with hyperglycemia: Secondary | ICD-10-CM | POA: Diagnosis not present

## 2023-11-14 DIAGNOSIS — Z992 Dependence on renal dialysis: Secondary | ICD-10-CM | POA: Diagnosis not present

## 2023-11-14 DIAGNOSIS — N186 End stage renal disease: Secondary | ICD-10-CM | POA: Diagnosis not present

## 2023-11-14 DIAGNOSIS — E1029 Type 1 diabetes mellitus with other diabetic kidney complication: Secondary | ICD-10-CM | POA: Diagnosis not present

## 2023-11-14 DIAGNOSIS — E1011 Type 1 diabetes mellitus with ketoacidosis with coma: Secondary | ICD-10-CM | POA: Diagnosis not present

## 2023-11-14 DIAGNOSIS — D509 Iron deficiency anemia, unspecified: Secondary | ICD-10-CM | POA: Diagnosis not present

## 2023-11-14 DIAGNOSIS — D631 Anemia in chronic kidney disease: Secondary | ICD-10-CM | POA: Diagnosis not present

## 2023-11-14 DIAGNOSIS — N2581 Secondary hyperparathyroidism of renal origin: Secondary | ICD-10-CM | POA: Diagnosis not present

## 2023-11-17 DIAGNOSIS — D631 Anemia in chronic kidney disease: Secondary | ICD-10-CM | POA: Diagnosis not present

## 2023-11-17 DIAGNOSIS — E1065 Type 1 diabetes mellitus with hyperglycemia: Secondary | ICD-10-CM | POA: Diagnosis not present

## 2023-11-17 DIAGNOSIS — N2581 Secondary hyperparathyroidism of renal origin: Secondary | ICD-10-CM | POA: Diagnosis not present

## 2023-11-17 DIAGNOSIS — Z992 Dependence on renal dialysis: Secondary | ICD-10-CM | POA: Diagnosis not present

## 2023-11-17 DIAGNOSIS — E1029 Type 1 diabetes mellitus with other diabetic kidney complication: Secondary | ICD-10-CM | POA: Diagnosis not present

## 2023-11-17 DIAGNOSIS — D509 Iron deficiency anemia, unspecified: Secondary | ICD-10-CM | POA: Diagnosis not present

## 2023-11-17 DIAGNOSIS — E1011 Type 1 diabetes mellitus with ketoacidosis with coma: Secondary | ICD-10-CM | POA: Diagnosis not present

## 2023-11-17 DIAGNOSIS — N186 End stage renal disease: Secondary | ICD-10-CM | POA: Diagnosis not present

## 2023-11-18 DIAGNOSIS — N186 End stage renal disease: Secondary | ICD-10-CM | POA: Diagnosis not present

## 2023-11-18 DIAGNOSIS — Z992 Dependence on renal dialysis: Secondary | ICD-10-CM | POA: Diagnosis not present

## 2023-11-18 DIAGNOSIS — D631 Anemia in chronic kidney disease: Secondary | ICD-10-CM | POA: Diagnosis not present

## 2023-11-18 DIAGNOSIS — E1065 Type 1 diabetes mellitus with hyperglycemia: Secondary | ICD-10-CM | POA: Diagnosis not present

## 2023-11-18 DIAGNOSIS — D509 Iron deficiency anemia, unspecified: Secondary | ICD-10-CM | POA: Diagnosis not present

## 2023-11-18 DIAGNOSIS — E1011 Type 1 diabetes mellitus with ketoacidosis with coma: Secondary | ICD-10-CM | POA: Diagnosis not present

## 2023-11-18 DIAGNOSIS — N2581 Secondary hyperparathyroidism of renal origin: Secondary | ICD-10-CM | POA: Diagnosis not present

## 2023-11-18 DIAGNOSIS — E1029 Type 1 diabetes mellitus with other diabetic kidney complication: Secondary | ICD-10-CM | POA: Diagnosis not present

## 2023-11-19 DIAGNOSIS — E1029 Type 1 diabetes mellitus with other diabetic kidney complication: Secondary | ICD-10-CM | POA: Diagnosis not present

## 2023-11-19 DIAGNOSIS — Z992 Dependence on renal dialysis: Secondary | ICD-10-CM | POA: Diagnosis not present

## 2023-11-19 DIAGNOSIS — N2581 Secondary hyperparathyroidism of renal origin: Secondary | ICD-10-CM | POA: Diagnosis not present

## 2023-11-19 DIAGNOSIS — E1011 Type 1 diabetes mellitus with ketoacidosis with coma: Secondary | ICD-10-CM | POA: Diagnosis not present

## 2023-11-19 DIAGNOSIS — N186 End stage renal disease: Secondary | ICD-10-CM | POA: Diagnosis not present

## 2023-11-19 DIAGNOSIS — D509 Iron deficiency anemia, unspecified: Secondary | ICD-10-CM | POA: Diagnosis not present

## 2023-11-19 DIAGNOSIS — E1065 Type 1 diabetes mellitus with hyperglycemia: Secondary | ICD-10-CM | POA: Diagnosis not present

## 2023-11-21 DIAGNOSIS — E1011 Type 1 diabetes mellitus with ketoacidosis with coma: Secondary | ICD-10-CM | POA: Diagnosis not present

## 2023-11-21 DIAGNOSIS — N2581 Secondary hyperparathyroidism of renal origin: Secondary | ICD-10-CM | POA: Diagnosis not present

## 2023-11-21 DIAGNOSIS — N186 End stage renal disease: Secondary | ICD-10-CM | POA: Diagnosis not present

## 2023-11-21 DIAGNOSIS — E1029 Type 1 diabetes mellitus with other diabetic kidney complication: Secondary | ICD-10-CM | POA: Diagnosis not present

## 2023-11-21 DIAGNOSIS — D509 Iron deficiency anemia, unspecified: Secondary | ICD-10-CM | POA: Diagnosis not present

## 2023-11-21 DIAGNOSIS — E1065 Type 1 diabetes mellitus with hyperglycemia: Secondary | ICD-10-CM | POA: Diagnosis not present

## 2023-11-21 DIAGNOSIS — Z992 Dependence on renal dialysis: Secondary | ICD-10-CM | POA: Diagnosis not present

## 2023-11-24 DIAGNOSIS — N2581 Secondary hyperparathyroidism of renal origin: Secondary | ICD-10-CM | POA: Diagnosis not present

## 2023-11-24 DIAGNOSIS — N186 End stage renal disease: Secondary | ICD-10-CM | POA: Diagnosis not present

## 2023-11-24 DIAGNOSIS — E1065 Type 1 diabetes mellitus with hyperglycemia: Secondary | ICD-10-CM | POA: Diagnosis not present

## 2023-11-24 DIAGNOSIS — D509 Iron deficiency anemia, unspecified: Secondary | ICD-10-CM | POA: Diagnosis not present

## 2023-11-24 DIAGNOSIS — E1029 Type 1 diabetes mellitus with other diabetic kidney complication: Secondary | ICD-10-CM | POA: Diagnosis not present

## 2023-11-24 DIAGNOSIS — E1011 Type 1 diabetes mellitus with ketoacidosis with coma: Secondary | ICD-10-CM | POA: Diagnosis not present

## 2023-11-24 DIAGNOSIS — Z992 Dependence on renal dialysis: Secondary | ICD-10-CM | POA: Diagnosis not present

## 2023-11-26 DIAGNOSIS — N186 End stage renal disease: Secondary | ICD-10-CM | POA: Diagnosis not present

## 2023-11-26 DIAGNOSIS — D509 Iron deficiency anemia, unspecified: Secondary | ICD-10-CM | POA: Diagnosis not present

## 2023-11-26 DIAGNOSIS — E1029 Type 1 diabetes mellitus with other diabetic kidney complication: Secondary | ICD-10-CM | POA: Diagnosis not present

## 2023-11-26 DIAGNOSIS — E1065 Type 1 diabetes mellitus with hyperglycemia: Secondary | ICD-10-CM | POA: Diagnosis not present

## 2023-11-26 DIAGNOSIS — Z992 Dependence on renal dialysis: Secondary | ICD-10-CM | POA: Diagnosis not present

## 2023-11-26 DIAGNOSIS — N2581 Secondary hyperparathyroidism of renal origin: Secondary | ICD-10-CM | POA: Diagnosis not present

## 2023-11-26 DIAGNOSIS — E1011 Type 1 diabetes mellitus with ketoacidosis with coma: Secondary | ICD-10-CM | POA: Diagnosis not present

## 2023-11-28 DIAGNOSIS — D509 Iron deficiency anemia, unspecified: Secondary | ICD-10-CM | POA: Diagnosis not present

## 2023-11-28 DIAGNOSIS — E1011 Type 1 diabetes mellitus with ketoacidosis with coma: Secondary | ICD-10-CM | POA: Diagnosis not present

## 2023-11-28 DIAGNOSIS — E1029 Type 1 diabetes mellitus with other diabetic kidney complication: Secondary | ICD-10-CM | POA: Diagnosis not present

## 2023-11-28 DIAGNOSIS — E1065 Type 1 diabetes mellitus with hyperglycemia: Secondary | ICD-10-CM | POA: Diagnosis not present

## 2023-11-28 DIAGNOSIS — Z992 Dependence on renal dialysis: Secondary | ICD-10-CM | POA: Diagnosis not present

## 2023-11-28 DIAGNOSIS — N2581 Secondary hyperparathyroidism of renal origin: Secondary | ICD-10-CM | POA: Diagnosis not present

## 2023-11-28 DIAGNOSIS — N186 End stage renal disease: Secondary | ICD-10-CM | POA: Diagnosis not present

## 2023-12-01 DIAGNOSIS — E1029 Type 1 diabetes mellitus with other diabetic kidney complication: Secondary | ICD-10-CM | POA: Diagnosis not present

## 2023-12-01 DIAGNOSIS — E1011 Type 1 diabetes mellitus with ketoacidosis with coma: Secondary | ICD-10-CM | POA: Diagnosis not present

## 2023-12-01 DIAGNOSIS — N186 End stage renal disease: Secondary | ICD-10-CM | POA: Diagnosis not present

## 2023-12-01 DIAGNOSIS — N2581 Secondary hyperparathyroidism of renal origin: Secondary | ICD-10-CM | POA: Diagnosis not present

## 2023-12-01 DIAGNOSIS — Z992 Dependence on renal dialysis: Secondary | ICD-10-CM | POA: Diagnosis not present

## 2023-12-01 DIAGNOSIS — E1065 Type 1 diabetes mellitus with hyperglycemia: Secondary | ICD-10-CM | POA: Diagnosis not present

## 2023-12-01 DIAGNOSIS — D509 Iron deficiency anemia, unspecified: Secondary | ICD-10-CM | POA: Diagnosis not present

## 2023-12-03 DIAGNOSIS — N2581 Secondary hyperparathyroidism of renal origin: Secondary | ICD-10-CM | POA: Diagnosis not present

## 2023-12-03 DIAGNOSIS — E1065 Type 1 diabetes mellitus with hyperglycemia: Secondary | ICD-10-CM | POA: Diagnosis not present

## 2023-12-03 DIAGNOSIS — D509 Iron deficiency anemia, unspecified: Secondary | ICD-10-CM | POA: Diagnosis not present

## 2023-12-03 DIAGNOSIS — E1029 Type 1 diabetes mellitus with other diabetic kidney complication: Secondary | ICD-10-CM | POA: Diagnosis not present

## 2023-12-03 DIAGNOSIS — N186 End stage renal disease: Secondary | ICD-10-CM | POA: Diagnosis not present

## 2023-12-03 DIAGNOSIS — E1011 Type 1 diabetes mellitus with ketoacidosis with coma: Secondary | ICD-10-CM | POA: Diagnosis not present

## 2023-12-03 DIAGNOSIS — Z992 Dependence on renal dialysis: Secondary | ICD-10-CM | POA: Diagnosis not present

## 2023-12-05 DIAGNOSIS — D509 Iron deficiency anemia, unspecified: Secondary | ICD-10-CM | POA: Diagnosis not present

## 2023-12-05 DIAGNOSIS — E1029 Type 1 diabetes mellitus with other diabetic kidney complication: Secondary | ICD-10-CM | POA: Diagnosis not present

## 2023-12-05 DIAGNOSIS — Z992 Dependence on renal dialysis: Secondary | ICD-10-CM | POA: Diagnosis not present

## 2023-12-05 DIAGNOSIS — N186 End stage renal disease: Secondary | ICD-10-CM | POA: Diagnosis not present

## 2023-12-05 DIAGNOSIS — N2581 Secondary hyperparathyroidism of renal origin: Secondary | ICD-10-CM | POA: Diagnosis not present

## 2023-12-05 DIAGNOSIS — E1011 Type 1 diabetes mellitus with ketoacidosis with coma: Secondary | ICD-10-CM | POA: Diagnosis not present

## 2023-12-05 DIAGNOSIS — E1065 Type 1 diabetes mellitus with hyperglycemia: Secondary | ICD-10-CM | POA: Diagnosis not present

## 2023-12-08 DIAGNOSIS — N2581 Secondary hyperparathyroidism of renal origin: Secondary | ICD-10-CM | POA: Diagnosis not present

## 2023-12-08 DIAGNOSIS — E1065 Type 1 diabetes mellitus with hyperglycemia: Secondary | ICD-10-CM | POA: Diagnosis not present

## 2023-12-08 DIAGNOSIS — E1022 Type 1 diabetes mellitus with diabetic chronic kidney disease: Secondary | ICD-10-CM | POA: Diagnosis not present

## 2023-12-08 DIAGNOSIS — N186 End stage renal disease: Secondary | ICD-10-CM | POA: Diagnosis not present

## 2023-12-08 DIAGNOSIS — E1011 Type 1 diabetes mellitus with ketoacidosis with coma: Secondary | ICD-10-CM | POA: Diagnosis not present

## 2023-12-08 DIAGNOSIS — D509 Iron deficiency anemia, unspecified: Secondary | ICD-10-CM | POA: Diagnosis not present

## 2023-12-08 DIAGNOSIS — Z992 Dependence on renal dialysis: Secondary | ICD-10-CM | POA: Diagnosis not present

## 2023-12-08 DIAGNOSIS — E1029 Type 1 diabetes mellitus with other diabetic kidney complication: Secondary | ICD-10-CM | POA: Diagnosis not present

## 2023-12-10 DIAGNOSIS — D509 Iron deficiency anemia, unspecified: Secondary | ICD-10-CM | POA: Diagnosis not present

## 2023-12-10 DIAGNOSIS — E1011 Type 1 diabetes mellitus with ketoacidosis with coma: Secondary | ICD-10-CM | POA: Diagnosis not present

## 2023-12-10 DIAGNOSIS — E1065 Type 1 diabetes mellitus with hyperglycemia: Secondary | ICD-10-CM | POA: Diagnosis not present

## 2023-12-10 DIAGNOSIS — N186 End stage renal disease: Secondary | ICD-10-CM | POA: Diagnosis not present

## 2023-12-10 DIAGNOSIS — E1029 Type 1 diabetes mellitus with other diabetic kidney complication: Secondary | ICD-10-CM | POA: Diagnosis not present

## 2023-12-10 DIAGNOSIS — Z992 Dependence on renal dialysis: Secondary | ICD-10-CM | POA: Diagnosis not present

## 2023-12-10 DIAGNOSIS — N2581 Secondary hyperparathyroidism of renal origin: Secondary | ICD-10-CM | POA: Diagnosis not present

## 2023-12-12 DIAGNOSIS — E1065 Type 1 diabetes mellitus with hyperglycemia: Secondary | ICD-10-CM | POA: Diagnosis not present

## 2023-12-12 DIAGNOSIS — E1029 Type 1 diabetes mellitus with other diabetic kidney complication: Secondary | ICD-10-CM | POA: Diagnosis not present

## 2023-12-12 DIAGNOSIS — D509 Iron deficiency anemia, unspecified: Secondary | ICD-10-CM | POA: Diagnosis not present

## 2023-12-12 DIAGNOSIS — E1011 Type 1 diabetes mellitus with ketoacidosis with coma: Secondary | ICD-10-CM | POA: Diagnosis not present

## 2023-12-12 DIAGNOSIS — N186 End stage renal disease: Secondary | ICD-10-CM | POA: Diagnosis not present

## 2023-12-12 DIAGNOSIS — Z992 Dependence on renal dialysis: Secondary | ICD-10-CM | POA: Diagnosis not present

## 2023-12-12 DIAGNOSIS — N2581 Secondary hyperparathyroidism of renal origin: Secondary | ICD-10-CM | POA: Diagnosis not present

## 2023-12-15 DIAGNOSIS — D509 Iron deficiency anemia, unspecified: Secondary | ICD-10-CM | POA: Diagnosis not present

## 2023-12-15 DIAGNOSIS — N2581 Secondary hyperparathyroidism of renal origin: Secondary | ICD-10-CM | POA: Diagnosis not present

## 2023-12-15 DIAGNOSIS — Z992 Dependence on renal dialysis: Secondary | ICD-10-CM | POA: Diagnosis not present

## 2023-12-15 DIAGNOSIS — E1029 Type 1 diabetes mellitus with other diabetic kidney complication: Secondary | ICD-10-CM | POA: Diagnosis not present

## 2023-12-15 DIAGNOSIS — N186 End stage renal disease: Secondary | ICD-10-CM | POA: Diagnosis not present

## 2023-12-15 DIAGNOSIS — E1011 Type 1 diabetes mellitus with ketoacidosis with coma: Secondary | ICD-10-CM | POA: Diagnosis not present

## 2023-12-15 DIAGNOSIS — E1065 Type 1 diabetes mellitus with hyperglycemia: Secondary | ICD-10-CM | POA: Diagnosis not present

## 2023-12-17 DIAGNOSIS — N186 End stage renal disease: Secondary | ICD-10-CM | POA: Diagnosis not present

## 2023-12-17 DIAGNOSIS — Z992 Dependence on renal dialysis: Secondary | ICD-10-CM | POA: Diagnosis not present

## 2023-12-17 DIAGNOSIS — E1065 Type 1 diabetes mellitus with hyperglycemia: Secondary | ICD-10-CM | POA: Diagnosis not present

## 2023-12-17 DIAGNOSIS — N2581 Secondary hyperparathyroidism of renal origin: Secondary | ICD-10-CM | POA: Diagnosis not present

## 2023-12-17 DIAGNOSIS — E1029 Type 1 diabetes mellitus with other diabetic kidney complication: Secondary | ICD-10-CM | POA: Diagnosis not present

## 2023-12-17 DIAGNOSIS — D509 Iron deficiency anemia, unspecified: Secondary | ICD-10-CM | POA: Diagnosis not present

## 2023-12-17 DIAGNOSIS — E1011 Type 1 diabetes mellitus with ketoacidosis with coma: Secondary | ICD-10-CM | POA: Diagnosis not present

## 2023-12-19 DIAGNOSIS — E1011 Type 1 diabetes mellitus with ketoacidosis with coma: Secondary | ICD-10-CM | POA: Diagnosis not present

## 2023-12-19 DIAGNOSIS — N186 End stage renal disease: Secondary | ICD-10-CM | POA: Diagnosis not present

## 2023-12-19 DIAGNOSIS — E1029 Type 1 diabetes mellitus with other diabetic kidney complication: Secondary | ICD-10-CM | POA: Diagnosis not present

## 2023-12-19 DIAGNOSIS — D509 Iron deficiency anemia, unspecified: Secondary | ICD-10-CM | POA: Diagnosis not present

## 2023-12-19 DIAGNOSIS — N2581 Secondary hyperparathyroidism of renal origin: Secondary | ICD-10-CM | POA: Diagnosis not present

## 2023-12-19 DIAGNOSIS — Z992 Dependence on renal dialysis: Secondary | ICD-10-CM | POA: Diagnosis not present

## 2023-12-19 DIAGNOSIS — E1065 Type 1 diabetes mellitus with hyperglycemia: Secondary | ICD-10-CM | POA: Diagnosis not present

## 2023-12-22 DIAGNOSIS — E1065 Type 1 diabetes mellitus with hyperglycemia: Secondary | ICD-10-CM | POA: Diagnosis not present

## 2023-12-22 DIAGNOSIS — E1029 Type 1 diabetes mellitus with other diabetic kidney complication: Secondary | ICD-10-CM | POA: Diagnosis not present

## 2023-12-22 DIAGNOSIS — Z992 Dependence on renal dialysis: Secondary | ICD-10-CM | POA: Diagnosis not present

## 2023-12-22 DIAGNOSIS — N2581 Secondary hyperparathyroidism of renal origin: Secondary | ICD-10-CM | POA: Diagnosis not present

## 2023-12-22 DIAGNOSIS — D509 Iron deficiency anemia, unspecified: Secondary | ICD-10-CM | POA: Diagnosis not present

## 2023-12-22 DIAGNOSIS — N186 End stage renal disease: Secondary | ICD-10-CM | POA: Diagnosis not present

## 2023-12-22 DIAGNOSIS — E1011 Type 1 diabetes mellitus with ketoacidosis with coma: Secondary | ICD-10-CM | POA: Diagnosis not present

## 2023-12-24 DIAGNOSIS — E1065 Type 1 diabetes mellitus with hyperglycemia: Secondary | ICD-10-CM | POA: Diagnosis not present

## 2023-12-24 DIAGNOSIS — N2581 Secondary hyperparathyroidism of renal origin: Secondary | ICD-10-CM | POA: Diagnosis not present

## 2023-12-24 DIAGNOSIS — E1029 Type 1 diabetes mellitus with other diabetic kidney complication: Secondary | ICD-10-CM | POA: Diagnosis not present

## 2023-12-24 DIAGNOSIS — N186 End stage renal disease: Secondary | ICD-10-CM | POA: Diagnosis not present

## 2023-12-24 DIAGNOSIS — Z992 Dependence on renal dialysis: Secondary | ICD-10-CM | POA: Diagnosis not present

## 2023-12-24 DIAGNOSIS — E1011 Type 1 diabetes mellitus with ketoacidosis with coma: Secondary | ICD-10-CM | POA: Diagnosis not present

## 2023-12-24 DIAGNOSIS — D509 Iron deficiency anemia, unspecified: Secondary | ICD-10-CM | POA: Diagnosis not present

## 2023-12-26 DIAGNOSIS — E1029 Type 1 diabetes mellitus with other diabetic kidney complication: Secondary | ICD-10-CM | POA: Diagnosis not present

## 2023-12-26 DIAGNOSIS — E1065 Type 1 diabetes mellitus with hyperglycemia: Secondary | ICD-10-CM | POA: Diagnosis not present

## 2023-12-26 DIAGNOSIS — D509 Iron deficiency anemia, unspecified: Secondary | ICD-10-CM | POA: Diagnosis not present

## 2023-12-26 DIAGNOSIS — N186 End stage renal disease: Secondary | ICD-10-CM | POA: Diagnosis not present

## 2023-12-26 DIAGNOSIS — N2581 Secondary hyperparathyroidism of renal origin: Secondary | ICD-10-CM | POA: Diagnosis not present

## 2023-12-26 DIAGNOSIS — E1011 Type 1 diabetes mellitus with ketoacidosis with coma: Secondary | ICD-10-CM | POA: Diagnosis not present

## 2023-12-26 DIAGNOSIS — Z992 Dependence on renal dialysis: Secondary | ICD-10-CM | POA: Diagnosis not present

## 2023-12-29 DIAGNOSIS — E1011 Type 1 diabetes mellitus with ketoacidosis with coma: Secondary | ICD-10-CM | POA: Diagnosis not present

## 2023-12-29 DIAGNOSIS — D509 Iron deficiency anemia, unspecified: Secondary | ICD-10-CM | POA: Diagnosis not present

## 2023-12-29 DIAGNOSIS — E1065 Type 1 diabetes mellitus with hyperglycemia: Secondary | ICD-10-CM | POA: Diagnosis not present

## 2023-12-29 DIAGNOSIS — N2581 Secondary hyperparathyroidism of renal origin: Secondary | ICD-10-CM | POA: Diagnosis not present

## 2023-12-29 DIAGNOSIS — Z992 Dependence on renal dialysis: Secondary | ICD-10-CM | POA: Diagnosis not present

## 2023-12-29 DIAGNOSIS — E1029 Type 1 diabetes mellitus with other diabetic kidney complication: Secondary | ICD-10-CM | POA: Diagnosis not present

## 2023-12-29 DIAGNOSIS — N186 End stage renal disease: Secondary | ICD-10-CM | POA: Diagnosis not present

## 2023-12-31 DIAGNOSIS — E1011 Type 1 diabetes mellitus with ketoacidosis with coma: Secondary | ICD-10-CM | POA: Diagnosis not present

## 2023-12-31 DIAGNOSIS — N186 End stage renal disease: Secondary | ICD-10-CM | POA: Diagnosis not present

## 2023-12-31 DIAGNOSIS — E1029 Type 1 diabetes mellitus with other diabetic kidney complication: Secondary | ICD-10-CM | POA: Diagnosis not present

## 2023-12-31 DIAGNOSIS — N2581 Secondary hyperparathyroidism of renal origin: Secondary | ICD-10-CM | POA: Diagnosis not present

## 2023-12-31 DIAGNOSIS — Z992 Dependence on renal dialysis: Secondary | ICD-10-CM | POA: Diagnosis not present

## 2023-12-31 DIAGNOSIS — E1065 Type 1 diabetes mellitus with hyperglycemia: Secondary | ICD-10-CM | POA: Diagnosis not present

## 2023-12-31 DIAGNOSIS — D509 Iron deficiency anemia, unspecified: Secondary | ICD-10-CM | POA: Diagnosis not present

## 2024-01-05 DIAGNOSIS — Z992 Dependence on renal dialysis: Secondary | ICD-10-CM | POA: Diagnosis not present

## 2024-01-05 DIAGNOSIS — E1011 Type 1 diabetes mellitus with ketoacidosis with coma: Secondary | ICD-10-CM | POA: Diagnosis not present

## 2024-01-05 DIAGNOSIS — E1029 Type 1 diabetes mellitus with other diabetic kidney complication: Secondary | ICD-10-CM | POA: Diagnosis not present

## 2024-01-05 DIAGNOSIS — N2581 Secondary hyperparathyroidism of renal origin: Secondary | ICD-10-CM | POA: Diagnosis not present

## 2024-01-05 DIAGNOSIS — N186 End stage renal disease: Secondary | ICD-10-CM | POA: Diagnosis not present

## 2024-01-05 DIAGNOSIS — D509 Iron deficiency anemia, unspecified: Secondary | ICD-10-CM | POA: Diagnosis not present

## 2024-01-05 DIAGNOSIS — E1065 Type 1 diabetes mellitus with hyperglycemia: Secondary | ICD-10-CM | POA: Diagnosis not present

## 2024-01-07 DIAGNOSIS — D509 Iron deficiency anemia, unspecified: Secondary | ICD-10-CM | POA: Diagnosis not present

## 2024-01-07 DIAGNOSIS — N186 End stage renal disease: Secondary | ICD-10-CM | POA: Diagnosis not present

## 2024-01-07 DIAGNOSIS — Z992 Dependence on renal dialysis: Secondary | ICD-10-CM | POA: Diagnosis not present

## 2024-01-07 DIAGNOSIS — E1011 Type 1 diabetes mellitus with ketoacidosis with coma: Secondary | ICD-10-CM | POA: Diagnosis not present

## 2024-01-07 DIAGNOSIS — E1065 Type 1 diabetes mellitus with hyperglycemia: Secondary | ICD-10-CM | POA: Diagnosis not present

## 2024-01-07 DIAGNOSIS — N2581 Secondary hyperparathyroidism of renal origin: Secondary | ICD-10-CM | POA: Diagnosis not present

## 2024-01-07 DIAGNOSIS — E1029 Type 1 diabetes mellitus with other diabetic kidney complication: Secondary | ICD-10-CM | POA: Diagnosis not present

## 2024-01-07 DIAGNOSIS — E1022 Type 1 diabetes mellitus with diabetic chronic kidney disease: Secondary | ICD-10-CM | POA: Diagnosis not present

## 2024-01-12 DIAGNOSIS — N186 End stage renal disease: Secondary | ICD-10-CM | POA: Diagnosis not present

## 2024-01-12 DIAGNOSIS — N2581 Secondary hyperparathyroidism of renal origin: Secondary | ICD-10-CM | POA: Diagnosis not present

## 2024-01-12 DIAGNOSIS — D509 Iron deficiency anemia, unspecified: Secondary | ICD-10-CM | POA: Diagnosis not present

## 2024-01-12 DIAGNOSIS — E1029 Type 1 diabetes mellitus with other diabetic kidney complication: Secondary | ICD-10-CM | POA: Diagnosis not present

## 2024-01-12 DIAGNOSIS — E1011 Type 1 diabetes mellitus with ketoacidosis with coma: Secondary | ICD-10-CM | POA: Diagnosis not present

## 2024-01-12 DIAGNOSIS — E1065 Type 1 diabetes mellitus with hyperglycemia: Secondary | ICD-10-CM | POA: Diagnosis not present

## 2024-01-12 DIAGNOSIS — Z992 Dependence on renal dialysis: Secondary | ICD-10-CM | POA: Diagnosis not present

## 2024-01-14 DIAGNOSIS — N186 End stage renal disease: Secondary | ICD-10-CM | POA: Diagnosis not present

## 2024-01-14 DIAGNOSIS — N2581 Secondary hyperparathyroidism of renal origin: Secondary | ICD-10-CM | POA: Diagnosis not present

## 2024-01-14 DIAGNOSIS — E1065 Type 1 diabetes mellitus with hyperglycemia: Secondary | ICD-10-CM | POA: Diagnosis not present

## 2024-01-14 DIAGNOSIS — D509 Iron deficiency anemia, unspecified: Secondary | ICD-10-CM | POA: Diagnosis not present

## 2024-01-14 DIAGNOSIS — E1011 Type 1 diabetes mellitus with ketoacidosis with coma: Secondary | ICD-10-CM | POA: Diagnosis not present

## 2024-01-14 DIAGNOSIS — E1029 Type 1 diabetes mellitus with other diabetic kidney complication: Secondary | ICD-10-CM | POA: Diagnosis not present

## 2024-01-14 DIAGNOSIS — Z992 Dependence on renal dialysis: Secondary | ICD-10-CM | POA: Diagnosis not present

## 2024-01-16 DIAGNOSIS — D509 Iron deficiency anemia, unspecified: Secondary | ICD-10-CM | POA: Diagnosis not present

## 2024-01-16 DIAGNOSIS — E1065 Type 1 diabetes mellitus with hyperglycemia: Secondary | ICD-10-CM | POA: Diagnosis not present

## 2024-01-16 DIAGNOSIS — E1029 Type 1 diabetes mellitus with other diabetic kidney complication: Secondary | ICD-10-CM | POA: Diagnosis not present

## 2024-01-16 DIAGNOSIS — N186 End stage renal disease: Secondary | ICD-10-CM | POA: Diagnosis not present

## 2024-01-16 DIAGNOSIS — Z992 Dependence on renal dialysis: Secondary | ICD-10-CM | POA: Diagnosis not present

## 2024-01-16 DIAGNOSIS — N2581 Secondary hyperparathyroidism of renal origin: Secondary | ICD-10-CM | POA: Diagnosis not present

## 2024-01-16 DIAGNOSIS — E1011 Type 1 diabetes mellitus with ketoacidosis with coma: Secondary | ICD-10-CM | POA: Diagnosis not present

## 2024-01-18 DIAGNOSIS — Z992 Dependence on renal dialysis: Secondary | ICD-10-CM | POA: Diagnosis not present

## 2024-01-18 DIAGNOSIS — E1029 Type 1 diabetes mellitus with other diabetic kidney complication: Secondary | ICD-10-CM | POA: Diagnosis not present

## 2024-01-18 DIAGNOSIS — N186 End stage renal disease: Secondary | ICD-10-CM | POA: Diagnosis not present

## 2024-01-19 DIAGNOSIS — N2581 Secondary hyperparathyroidism of renal origin: Secondary | ICD-10-CM | POA: Diagnosis not present

## 2024-01-19 DIAGNOSIS — E1065 Type 1 diabetes mellitus with hyperglycemia: Secondary | ICD-10-CM | POA: Diagnosis not present

## 2024-01-19 DIAGNOSIS — E1029 Type 1 diabetes mellitus with other diabetic kidney complication: Secondary | ICD-10-CM | POA: Diagnosis not present

## 2024-01-19 DIAGNOSIS — N186 End stage renal disease: Secondary | ICD-10-CM | POA: Diagnosis not present

## 2024-01-19 DIAGNOSIS — Z992 Dependence on renal dialysis: Secondary | ICD-10-CM | POA: Diagnosis not present

## 2024-01-19 DIAGNOSIS — D509 Iron deficiency anemia, unspecified: Secondary | ICD-10-CM | POA: Diagnosis not present

## 2024-01-19 DIAGNOSIS — E1011 Type 1 diabetes mellitus with ketoacidosis with coma: Secondary | ICD-10-CM | POA: Diagnosis not present

## 2024-01-20 DIAGNOSIS — N186 End stage renal disease: Secondary | ICD-10-CM | POA: Diagnosis not present

## 2024-01-20 DIAGNOSIS — Z992 Dependence on renal dialysis: Secondary | ICD-10-CM | POA: Diagnosis not present

## 2024-01-20 DIAGNOSIS — E1022 Type 1 diabetes mellitus with diabetic chronic kidney disease: Secondary | ICD-10-CM | POA: Diagnosis not present

## 2024-01-20 DIAGNOSIS — Z978 Presence of other specified devices: Secondary | ICD-10-CM | POA: Diagnosis not present

## 2024-01-20 DIAGNOSIS — E103593 Type 1 diabetes mellitus with proliferative diabetic retinopathy without macular edema, bilateral: Secondary | ICD-10-CM | POA: Diagnosis not present

## 2024-01-20 DIAGNOSIS — Z9641 Presence of insulin pump (external) (internal): Secondary | ICD-10-CM | POA: Diagnosis not present

## 2024-01-21 DIAGNOSIS — E1011 Type 1 diabetes mellitus with ketoacidosis with coma: Secondary | ICD-10-CM | POA: Diagnosis not present

## 2024-01-21 DIAGNOSIS — D509 Iron deficiency anemia, unspecified: Secondary | ICD-10-CM | POA: Diagnosis not present

## 2024-01-21 DIAGNOSIS — N2581 Secondary hyperparathyroidism of renal origin: Secondary | ICD-10-CM | POA: Diagnosis not present

## 2024-01-21 DIAGNOSIS — Z992 Dependence on renal dialysis: Secondary | ICD-10-CM | POA: Diagnosis not present

## 2024-01-21 DIAGNOSIS — N186 End stage renal disease: Secondary | ICD-10-CM | POA: Diagnosis not present

## 2024-01-21 DIAGNOSIS — E1029 Type 1 diabetes mellitus with other diabetic kidney complication: Secondary | ICD-10-CM | POA: Diagnosis not present

## 2024-01-21 DIAGNOSIS — E1065 Type 1 diabetes mellitus with hyperglycemia: Secondary | ICD-10-CM | POA: Diagnosis not present

## 2024-01-23 DIAGNOSIS — E1065 Type 1 diabetes mellitus with hyperglycemia: Secondary | ICD-10-CM | POA: Diagnosis not present

## 2024-01-23 DIAGNOSIS — D509 Iron deficiency anemia, unspecified: Secondary | ICD-10-CM | POA: Diagnosis not present

## 2024-01-23 DIAGNOSIS — Z992 Dependence on renal dialysis: Secondary | ICD-10-CM | POA: Diagnosis not present

## 2024-01-23 DIAGNOSIS — E1011 Type 1 diabetes mellitus with ketoacidosis with coma: Secondary | ICD-10-CM | POA: Diagnosis not present

## 2024-01-23 DIAGNOSIS — N186 End stage renal disease: Secondary | ICD-10-CM | POA: Diagnosis not present

## 2024-01-23 DIAGNOSIS — N2581 Secondary hyperparathyroidism of renal origin: Secondary | ICD-10-CM | POA: Diagnosis not present

## 2024-01-23 DIAGNOSIS — E1029 Type 1 diabetes mellitus with other diabetic kidney complication: Secondary | ICD-10-CM | POA: Diagnosis not present

## 2024-01-26 DIAGNOSIS — E1011 Type 1 diabetes mellitus with ketoacidosis with coma: Secondary | ICD-10-CM | POA: Diagnosis not present

## 2024-01-26 DIAGNOSIS — E1065 Type 1 diabetes mellitus with hyperglycemia: Secondary | ICD-10-CM | POA: Diagnosis not present

## 2024-01-26 DIAGNOSIS — E1029 Type 1 diabetes mellitus with other diabetic kidney complication: Secondary | ICD-10-CM | POA: Diagnosis not present

## 2024-01-26 DIAGNOSIS — Z992 Dependence on renal dialysis: Secondary | ICD-10-CM | POA: Diagnosis not present

## 2024-01-26 DIAGNOSIS — N186 End stage renal disease: Secondary | ICD-10-CM | POA: Diagnosis not present

## 2024-01-26 DIAGNOSIS — D509 Iron deficiency anemia, unspecified: Secondary | ICD-10-CM | POA: Diagnosis not present

## 2024-01-26 DIAGNOSIS — N2581 Secondary hyperparathyroidism of renal origin: Secondary | ICD-10-CM | POA: Diagnosis not present

## 2024-01-28 DIAGNOSIS — N186 End stage renal disease: Secondary | ICD-10-CM | POA: Diagnosis not present

## 2024-01-28 DIAGNOSIS — E1065 Type 1 diabetes mellitus with hyperglycemia: Secondary | ICD-10-CM | POA: Diagnosis not present

## 2024-01-28 DIAGNOSIS — D509 Iron deficiency anemia, unspecified: Secondary | ICD-10-CM | POA: Diagnosis not present

## 2024-01-28 DIAGNOSIS — Z992 Dependence on renal dialysis: Secondary | ICD-10-CM | POA: Diagnosis not present

## 2024-01-28 DIAGNOSIS — N2581 Secondary hyperparathyroidism of renal origin: Secondary | ICD-10-CM | POA: Diagnosis not present

## 2024-01-28 DIAGNOSIS — E1011 Type 1 diabetes mellitus with ketoacidosis with coma: Secondary | ICD-10-CM | POA: Diagnosis not present

## 2024-01-28 DIAGNOSIS — E1029 Type 1 diabetes mellitus with other diabetic kidney complication: Secondary | ICD-10-CM | POA: Diagnosis not present

## 2024-01-30 DIAGNOSIS — Z992 Dependence on renal dialysis: Secondary | ICD-10-CM | POA: Diagnosis not present

## 2024-01-30 DIAGNOSIS — E1065 Type 1 diabetes mellitus with hyperglycemia: Secondary | ICD-10-CM | POA: Diagnosis not present

## 2024-01-30 DIAGNOSIS — E1029 Type 1 diabetes mellitus with other diabetic kidney complication: Secondary | ICD-10-CM | POA: Diagnosis not present

## 2024-01-30 DIAGNOSIS — E1011 Type 1 diabetes mellitus with ketoacidosis with coma: Secondary | ICD-10-CM | POA: Diagnosis not present

## 2024-01-30 DIAGNOSIS — N186 End stage renal disease: Secondary | ICD-10-CM | POA: Diagnosis not present

## 2024-01-30 DIAGNOSIS — D509 Iron deficiency anemia, unspecified: Secondary | ICD-10-CM | POA: Diagnosis not present

## 2024-01-30 DIAGNOSIS — N2581 Secondary hyperparathyroidism of renal origin: Secondary | ICD-10-CM | POA: Diagnosis not present

## 2024-02-02 DIAGNOSIS — N186 End stage renal disease: Secondary | ICD-10-CM | POA: Diagnosis not present

## 2024-02-02 DIAGNOSIS — E1029 Type 1 diabetes mellitus with other diabetic kidney complication: Secondary | ICD-10-CM | POA: Diagnosis not present

## 2024-02-02 DIAGNOSIS — D509 Iron deficiency anemia, unspecified: Secondary | ICD-10-CM | POA: Diagnosis not present

## 2024-02-02 DIAGNOSIS — Z992 Dependence on renal dialysis: Secondary | ICD-10-CM | POA: Diagnosis not present

## 2024-02-02 DIAGNOSIS — E1065 Type 1 diabetes mellitus with hyperglycemia: Secondary | ICD-10-CM | POA: Diagnosis not present

## 2024-02-02 DIAGNOSIS — N2581 Secondary hyperparathyroidism of renal origin: Secondary | ICD-10-CM | POA: Diagnosis not present

## 2024-02-02 DIAGNOSIS — E1011 Type 1 diabetes mellitus with ketoacidosis with coma: Secondary | ICD-10-CM | POA: Diagnosis not present

## 2024-02-04 DIAGNOSIS — N186 End stage renal disease: Secondary | ICD-10-CM | POA: Diagnosis not present

## 2024-02-04 DIAGNOSIS — E1029 Type 1 diabetes mellitus with other diabetic kidney complication: Secondary | ICD-10-CM | POA: Diagnosis not present

## 2024-02-04 DIAGNOSIS — E1011 Type 1 diabetes mellitus with ketoacidosis with coma: Secondary | ICD-10-CM | POA: Diagnosis not present

## 2024-02-04 DIAGNOSIS — Z992 Dependence on renal dialysis: Secondary | ICD-10-CM | POA: Diagnosis not present

## 2024-02-04 DIAGNOSIS — E1065 Type 1 diabetes mellitus with hyperglycemia: Secondary | ICD-10-CM | POA: Diagnosis not present

## 2024-02-04 DIAGNOSIS — N2581 Secondary hyperparathyroidism of renal origin: Secondary | ICD-10-CM | POA: Diagnosis not present

## 2024-02-04 DIAGNOSIS — D509 Iron deficiency anemia, unspecified: Secondary | ICD-10-CM | POA: Diagnosis not present

## 2024-02-06 DIAGNOSIS — Z992 Dependence on renal dialysis: Secondary | ICD-10-CM | POA: Diagnosis not present

## 2024-02-06 DIAGNOSIS — N186 End stage renal disease: Secondary | ICD-10-CM | POA: Diagnosis not present

## 2024-02-06 DIAGNOSIS — E1029 Type 1 diabetes mellitus with other diabetic kidney complication: Secondary | ICD-10-CM | POA: Diagnosis not present

## 2024-02-06 DIAGNOSIS — D509 Iron deficiency anemia, unspecified: Secondary | ICD-10-CM | POA: Diagnosis not present

## 2024-02-06 DIAGNOSIS — N2581 Secondary hyperparathyroidism of renal origin: Secondary | ICD-10-CM | POA: Diagnosis not present

## 2024-02-06 DIAGNOSIS — E1011 Type 1 diabetes mellitus with ketoacidosis with coma: Secondary | ICD-10-CM | POA: Diagnosis not present

## 2024-02-06 DIAGNOSIS — E1065 Type 1 diabetes mellitus with hyperglycemia: Secondary | ICD-10-CM | POA: Diagnosis not present

## 2024-02-09 DIAGNOSIS — N2581 Secondary hyperparathyroidism of renal origin: Secondary | ICD-10-CM | POA: Diagnosis not present

## 2024-02-09 DIAGNOSIS — D509 Iron deficiency anemia, unspecified: Secondary | ICD-10-CM | POA: Diagnosis not present

## 2024-02-09 DIAGNOSIS — N186 End stage renal disease: Secondary | ICD-10-CM | POA: Diagnosis not present

## 2024-02-09 DIAGNOSIS — E1065 Type 1 diabetes mellitus with hyperglycemia: Secondary | ICD-10-CM | POA: Diagnosis not present

## 2024-02-09 DIAGNOSIS — E1029 Type 1 diabetes mellitus with other diabetic kidney complication: Secondary | ICD-10-CM | POA: Diagnosis not present

## 2024-02-09 DIAGNOSIS — E1011 Type 1 diabetes mellitus with ketoacidosis with coma: Secondary | ICD-10-CM | POA: Diagnosis not present

## 2024-02-09 DIAGNOSIS — Z992 Dependence on renal dialysis: Secondary | ICD-10-CM | POA: Diagnosis not present

## 2024-02-11 DIAGNOSIS — Z992 Dependence on renal dialysis: Secondary | ICD-10-CM | POA: Diagnosis not present

## 2024-02-11 DIAGNOSIS — N186 End stage renal disease: Secondary | ICD-10-CM | POA: Diagnosis not present

## 2024-02-11 DIAGNOSIS — N2581 Secondary hyperparathyroidism of renal origin: Secondary | ICD-10-CM | POA: Diagnosis not present

## 2024-02-11 DIAGNOSIS — D509 Iron deficiency anemia, unspecified: Secondary | ICD-10-CM | POA: Diagnosis not present

## 2024-02-11 DIAGNOSIS — E1011 Type 1 diabetes mellitus with ketoacidosis with coma: Secondary | ICD-10-CM | POA: Diagnosis not present

## 2024-02-11 DIAGNOSIS — E1065 Type 1 diabetes mellitus with hyperglycemia: Secondary | ICD-10-CM | POA: Diagnosis not present

## 2024-02-11 DIAGNOSIS — E1029 Type 1 diabetes mellitus with other diabetic kidney complication: Secondary | ICD-10-CM | POA: Diagnosis not present

## 2024-02-13 DIAGNOSIS — Z992 Dependence on renal dialysis: Secondary | ICD-10-CM | POA: Diagnosis not present

## 2024-02-13 DIAGNOSIS — N186 End stage renal disease: Secondary | ICD-10-CM | POA: Diagnosis not present

## 2024-02-13 DIAGNOSIS — E1011 Type 1 diabetes mellitus with ketoacidosis with coma: Secondary | ICD-10-CM | POA: Diagnosis not present

## 2024-02-13 DIAGNOSIS — N2581 Secondary hyperparathyroidism of renal origin: Secondary | ICD-10-CM | POA: Diagnosis not present

## 2024-02-13 DIAGNOSIS — E1065 Type 1 diabetes mellitus with hyperglycemia: Secondary | ICD-10-CM | POA: Diagnosis not present

## 2024-02-13 DIAGNOSIS — D509 Iron deficiency anemia, unspecified: Secondary | ICD-10-CM | POA: Diagnosis not present

## 2024-02-13 DIAGNOSIS — E1029 Type 1 diabetes mellitus with other diabetic kidney complication: Secondary | ICD-10-CM | POA: Diagnosis not present

## 2024-02-16 DIAGNOSIS — Z992 Dependence on renal dialysis: Secondary | ICD-10-CM | POA: Diagnosis not present

## 2024-02-16 DIAGNOSIS — N186 End stage renal disease: Secondary | ICD-10-CM | POA: Diagnosis not present

## 2024-02-16 DIAGNOSIS — E1029 Type 1 diabetes mellitus with other diabetic kidney complication: Secondary | ICD-10-CM | POA: Diagnosis not present

## 2024-02-16 DIAGNOSIS — E1065 Type 1 diabetes mellitus with hyperglycemia: Secondary | ICD-10-CM | POA: Diagnosis not present

## 2024-02-16 DIAGNOSIS — E1011 Type 1 diabetes mellitus with ketoacidosis with coma: Secondary | ICD-10-CM | POA: Diagnosis not present

## 2024-02-16 DIAGNOSIS — N2581 Secondary hyperparathyroidism of renal origin: Secondary | ICD-10-CM | POA: Diagnosis not present

## 2024-02-16 DIAGNOSIS — D509 Iron deficiency anemia, unspecified: Secondary | ICD-10-CM | POA: Diagnosis not present

## 2024-02-18 DIAGNOSIS — E1065 Type 1 diabetes mellitus with hyperglycemia: Secondary | ICD-10-CM | POA: Diagnosis not present

## 2024-02-18 DIAGNOSIS — E1011 Type 1 diabetes mellitus with ketoacidosis with coma: Secondary | ICD-10-CM | POA: Diagnosis not present

## 2024-02-18 DIAGNOSIS — N186 End stage renal disease: Secondary | ICD-10-CM | POA: Diagnosis not present

## 2024-02-18 DIAGNOSIS — N2581 Secondary hyperparathyroidism of renal origin: Secondary | ICD-10-CM | POA: Diagnosis not present

## 2024-02-18 DIAGNOSIS — E1029 Type 1 diabetes mellitus with other diabetic kidney complication: Secondary | ICD-10-CM | POA: Diagnosis not present

## 2024-02-18 DIAGNOSIS — Z992 Dependence on renal dialysis: Secondary | ICD-10-CM | POA: Diagnosis not present

## 2024-02-18 DIAGNOSIS — D509 Iron deficiency anemia, unspecified: Secondary | ICD-10-CM | POA: Diagnosis not present

## 2024-02-19 ENCOUNTER — Encounter (HOSPITAL_COMMUNITY)
Admission: RE | Disposition: A | Discharge status: Home / Self Care | Payer: Self-pay | Source: Home / Self Care | Attending: Vascular Surgery

## 2024-02-19 ENCOUNTER — Other Ambulatory Visit: Payer: Self-pay

## 2024-02-19 ENCOUNTER — Ambulatory Visit (HOSPITAL_COMMUNITY)
Admission: RE | Admit: 2024-02-19 | Discharge: 2024-02-19 | Disposition: A | Discharge status: Home / Self Care | Attending: Vascular Surgery | Admitting: Vascular Surgery

## 2024-02-19 DIAGNOSIS — Z992 Dependence on renal dialysis: Secondary | ICD-10-CM | POA: Diagnosis not present

## 2024-02-19 DIAGNOSIS — I132 Hypertensive heart and chronic kidney disease with heart failure and with stage 5 chronic kidney disease, or end stage renal disease: Secondary | ICD-10-CM | POA: Diagnosis not present

## 2024-02-19 DIAGNOSIS — I509 Heart failure, unspecified: Secondary | ICD-10-CM | POA: Diagnosis not present

## 2024-02-19 DIAGNOSIS — N186 End stage renal disease: Secondary | ICD-10-CM | POA: Insufficient documentation

## 2024-02-19 DIAGNOSIS — T82858A Stenosis of vascular prosthetic devices, implants and grafts, initial encounter: Secondary | ICD-10-CM | POA: Diagnosis not present

## 2024-02-19 DIAGNOSIS — E1022 Type 1 diabetes mellitus with diabetic chronic kidney disease: Secondary | ICD-10-CM | POA: Insufficient documentation

## 2024-02-19 DIAGNOSIS — Z794 Long term (current) use of insulin: Secondary | ICD-10-CM | POA: Insufficient documentation

## 2024-02-19 DIAGNOSIS — Y832 Surgical operation with anastomosis, bypass or graft as the cause of abnormal reaction of the patient, or of later complication, without mention of misadventure at the time of the procedure: Secondary | ICD-10-CM | POA: Insufficient documentation

## 2024-02-19 HISTORY — PX: VENOUS ANGIOPLASTY: CATH118376

## 2024-02-19 HISTORY — PX: A/V SHUNT INTERVENTION: CATH118220

## 2024-02-19 SURGERY — A/V SHUNT INTERVENTION
Anesthesia: LOCAL | Site: Arm Upper | Laterality: Right

## 2024-02-19 MED ORDER — HEPARIN (PORCINE) IN NACL 1000-0.9 UT/500ML-% IV SOLN
INTRAVENOUS | Status: DC | PRN
  Administered 2024-02-19: 500 mL

## 2024-02-19 MED ORDER — LIDOCAINE HCL (PF) 1 % IJ SOLN
INTRAMUSCULAR | Status: AC
  Filled 2024-02-19: qty 30

## 2024-02-19 MED ORDER — LIDOCAINE HCL (PF) 1 % IJ SOLN
INTRAMUSCULAR | Status: DC | PRN
  Administered 2024-02-19: 2 mL via SUBCUTANEOUS

## 2024-02-19 MED ORDER — IODIXANOL 320 MG/ML IV SOLN
INTRAVENOUS | Status: DC | PRN
  Administered 2024-02-19: 25 mL via INTRAVENOUS

## 2024-02-19 SURGICAL SUPPLY — 10 items
BALLOON MUSTANG 7X80X75 (BALLOONS) IMPLANT
KIT ENCORE 26 ADVANTAGE (KITS) IMPLANT
KIT MICROPUNCTURE NIT STIFF (SHEATH) IMPLANT
SHEATH PINNACLE R/O II 6F 4CM (SHEATH) IMPLANT
SHEATH PROBE COVER 6X72 (BAG) IMPLANT
STOPCOCK MORSE 400PSI 3WAY (MISCELLANEOUS) IMPLANT
TRAY PV CATH (CUSTOM PROCEDURE TRAY) ×2 IMPLANT
TUBING CIL FLEX 10 FLL-RA (TUBING) IMPLANT
WIRE BENTSON .035X145CM (WIRE) IMPLANT
WIRE TORQFLEX AUST .018X40CM (WIRE) IMPLANT

## 2024-02-19 NOTE — H&P (Signed)
 H&P     History of Present Illness: This is a 48 y.o. female with end stage renal disease that presents for fistulogram for right upper arm loop graft.  Lately been having trouble following this for several months.  Past Medical History:  Diagnosis Date   Anemia    Arthritis    Ascites 03/28/2020   Bacteremia 02/11/2020   Cerebral infarction due to unspecified mechanism 05/22/2014   CHF (congestive heart failure) (HCC)    Diabetes mellitus    Diabetic retinopathy (HCC)    Hx of laser Rx's   DM type 1 (diabetes mellitus, type 1) (HCC) 09/12/2014   Age of onset for DM type 1 was age 82.      Enlarged thyroid  gland    ESRD on hemodialysis (HCC)    ESRD due to DM type I, age of onset DM 1 was age 28.  Went on dialysis in March 2012.  Gets HD now at [facility] on a TTS schedule.     ESRD on hemodialysis (HCC) 09/12/2014   ESRD due to DM type 1.  Started HD in 2012 at [facility].  Gets HD there on a TTS schedule.     GERD (gastroesophageal reflux disease)    HCAP (healthcare-associated pneumonia) 09/11/2014   Hemorrhage from arteriovenous dialysis graft (HCC) 10/20/2019   History of blood transfusion    Hyperlipidemia    Hypertension    Hypertensive emergency 05/22/2014   Peripheral vascular disease (HCC)    Pneumonia     Past Surgical History:  Procedure Laterality Date   A/V FISTULAGRAM Right 10/31/2020   Procedure: A/V FISTULAGRAM;  Surgeon: Magda Debby SAILOR, MD;  [facility]: [facility];  Service: Cardiovascular;  Laterality: Right;   ARTERIOVENOUS GRAFT PLACEMENT     ARTERY REPAIR Left 12/10/2012   Procedure: BRACHIAL ARTERY REPAIR;  Surgeon: Lonni GORMAN Blade, MD;  Location: [facility] OR;  Service: Vascular;  Laterality: Left;  Exploration Left Brachial Artery for AVF   AV FISTULA PLACEMENT Right 01/11/2018   Procedure: INSERTION OF ARTERIOVENOUS (AV) GORE-TEX GRAFT  UPPER ARM;  Surgeon: Blade Lonni GORMAN, MD;  Location: [facility] OR;  Service: Vascular;  Laterality:  Right;   AVGG REMOVAL Left 08/12/2022   Procedure: LEFT ARM ARTERIOVENOUS GRAFT EXCISION WITH VEIN PATCH ANGIOPLASTY;  Surgeon: Sheree Penne Lonni, MD;  Location: [facility] OR;  Service: Vascular;  Laterality: Left;   BASCILIC VEIN TRANSPOSITION Right 09/28/2017   Procedure: FIRST STAGE BASILIC VEIN TRANSPOSITION;  Surgeon: Blade Lonni GORMAN, MD;  Location: [facility] OR;  Service: Vascular;  Laterality: Right;   CESAREAN SECTION     CHEST TUBE INSERTION Right 06/01/2020   Procedure: CHEST TUBE INSERTION;  Surgeon: Brenna Adine CROME, DO;  Location: [facility] ENDOS[facility];  Service: Pulmonary;  Laterality: Right;   CHEST TUBE INSERTION Right 07/04/2020   Procedure: INSERTION PLEURAL DRAINAGE CATHETER;  Surgeon: Kerrin Elspeth BROCKS, MD;  Location: [facility] OR;  Service: Thoracic;  Laterality: Right;   EYE SURGERY     LAzer   EYE SURGERY Left    IR THORACENTESIS ASP PLEURAL SPACE W/IMG GUIDE  05/31/2020   IR THORACENTESIS ASP PLEURAL SPACE W/IMG GUIDE  06/22/2020   REVISION OF ARTERIOVENOUS GORETEX GRAFT Left 08/01/2016   Procedure: REVISION OF ARTERIOVENOUS GORETEX GRAFT;  Surgeon: Lonni GORMAN Blade, MD;  Location: [facility] OR;  Service: Vascular;  Laterality: Left;   REVISION OF ARTERIOVENOUS GORETEX GRAFT Right 10/21/2019   Procedure: REVISION OF ARTERIOVENOUS GORETEX GRAFT ARM;  Surgeon: Oris Krystal FALCON, MD;  Location: [facility] OR;  Service: Vascular;  Laterality: Right;   RIGHT HEART CATH N/A 08/01/2020   Procedure: RIGHT HEART CATH;  Surgeon: Cherrie Toribio SAUNDERS, MD;  Location: [facility];  Service: Cardiovascular;  Laterality: N/A;   SHUNTOGRAM Left 11/30/2013   Procedure: RICH;  Surgeon: Gaile LELON New, MD;  Location: [facility] CATH LAB;  Service: Cardiovascular;  Laterality: Left;   TEE WITHOUT CARDIOVERSION N/A 02/13/2020   Procedure: TRANSESOPHAGEAL ECHOCARDIOGRAM (TEE);  Surgeon: Delford Maude BROCKS, MD;  Location: [facility] ENDOSCOPY;  Service: Cardiovascular;  Laterality: N/A;   TEE WITHOUT CARDIOVERSION N/A 08/15/2022    Procedure: TRANSESOPHAGEAL ECHOCARDIOGRAM (TEE);  Surgeon: Alvan Ronal BRAVO, MD;  Location: [facility] ENDOSCOPY;  Service: Cardiovascular;  Laterality: N/A;    Allergies  Allergen Reactions   Pollen Extract Other (See Comments)    Watery eyes    Prior to Admission medications   Medication Sig Start Date End Date Taking? Authorizing Provider  amLODipine  (NORVASC ) 10 MG tablet Take 10 mg by mouth in the morning.    [provider]  aspirin  EC 81 MG tablet Take 81 mg by mouth in the morning.    [provider]  labetalol  (NORMODYNE ) 300 MG tablet Take 300 mg by mouth in the morning.    [provider]  Lanthanum Carbonate (FOSRENOL) 1000 MG PACK Take 2,000 mg by mouth with breakfast, with lunch, and with evening meal.    [provider]  loratadine  (CLARITIN ) 10 MG tablet Take 10 mg by mouth daily as needed for allergies.    [provider]  NOVOLOG  100 UNIT/ML injection Inject 150 Units into the skin See admin instructions. 150 units over 7 days 10/10/20   [provider]  Pediatric Multivit-Minerals-C (FLINTSTONES GUMMIES PO) Take 2 tablets by mouth in the morning.    [provider]  rosuvastatin  (CRESTOR ) 10 MG tablet Take 10 mg by mouth at bedtime.    [provider]    Social History   Socioeconomic History   Marital status: Single    Spouse name: Not on file   Number of children: 1   Years of education: BACHELOR'S   Highest education level: Not on file  Occupational History   Not on file  Tobacco Use   Smoking status: Never   Smokeless tobacco: Never  Vaping Use   Vaping status: Never Used  Substance and Sexual Activity   Alcohol  use: No    Alcohol /week: 0.0 standard drinks of alcohol    Drug use: No   Sexual activity: Not Currently  Other Topics Concern   Not on file  Social History Narrative   ** Merged History Encounter **       Patient is single with 1 child. Patient is right handed. Patient has BS  degree. Patient drinks 0 caffeine.   Social Drivers of Corporate investment banker Strain: Not on file  Food Insecurity: Low Risk  (12/26/2022)   Received from [facility]   Hunger Vital Sign    Within the past 12 months, you worried that your food would run out before you got money to buy more: Never true    Within the past 12 months, the food you bought just didn't last and you didn't have money to get more. : Never true  Transportation Needs: Not on file (12/26/2022)  Physical Activity: Not on file  Stress: Not on file  Social Connections: Not on file  Intimate Partner Violence: Not At Risk (08/07/2022)   Humiliation, Afraid,  Rape, and Kick questionnaire    Fear of Current or Ex-Partner: No    Emotionally Abused: No    Physically Abused: No    Sexually Abused: No     Family History  Problem Relation Age of Onset   Cancer Mother        breast   Hypertension Father     ROS: [x]  Positive   [ ]  Negative   [ ]  All sytems reviewed and are negative  Cardiovascular: []  chest pain/pressure []  palpitations []  SOB lying flat []  DOE []  pain in legs while walking []  pain in legs at rest []  pain in legs at night []  non-healing ulcers []  hx of DVT []  swelling in legs  Pulmonary: []  productive cough []  asthma/wheezing []  home O2  Neurologic: []  weakness in []  arms []  legs []  numbness in []  arms []  legs []  hx of CVA []  mini stroke [] difficulty speaking or slurred speech []  temporary loss of vision in one eye []  dizziness  Hematologic: []  hx of cancer []  bleeding problems []  problems with blood clotting easily  Endocrine:   []  diabetes []  thyroid  disease  GI []  vomiting blood []  blood in stool  GU: []  CKD/renal failure []  HD--[]  M/W/F or []  T/T/S []  burning with urination []  blood in urine  Psychiatric: []  anxiety []  depression  Musculoskeletal: []  arthritis []  joint pain  Integumentary: []  rashes []  ulcers  Constitutional: []  fever []   chills   Physical Examination  Vitals:   02/19/24 0922 02/19/24 0930  BP: (!) 185/98 (!) 161/84  Pulse: 71 73  Resp: 12 14  Temp: 97.7 F (36.5 C)   SpO2: 100% 97%   There is no height or weight on file to calculate BMI.  General:  NAD Gait: Not observed HENT: WNL, normocephalic Pulmonary: normal non-labored breathing Cardiac: regular, without  Murmurs, rubs or gallops Abdomen:  soft, NT/ND, no masses Vascular Exam/Pulses: Right upper arm loop graft with thrill Extremities: without ischemic changes Musculoskeletal: no muscle wasting or atrophy  Neurologic: A&O X 3; Appropriate Affect ; SENSATION: normal; MOTOR FUNCTION:  moving all extremities equally. Speech is fluent/normal   CBC    Component Value Date/Time   WBC 7.5 08/13/2022 0141   RBC 3.71 (L) 08/13/2022 0141   HGB 10.2 (L) 08/13/2022 0141   HCT 31.0 (L) 08/13/2022 0141   PLT 353 08/13/2022 0141   MCV 83.6 08/13/2022 0141   MCH 27.5 08/13/2022 0141   MCHC 32.9 08/13/2022 0141   RDW 18.3 (H) 08/13/2022 0141   LYMPHSABS 0.3 (L) 08/13/2022 0141   MONOABS 0.4 08/13/2022 0141   EOSABS 0.1 08/13/2022 0141   BASOSABS 0.0 08/13/2022 0141    BMET    Component Value Date/Time   NA 131 (L) 08/15/2022 0824   K 4.5 08/15/2022 0824   CL 89 (L) 08/15/2022 0824   CO2 25 08/15/2022 0824   GLUCOSE 480 (H) 08/15/2022 0824   BUN 22 (H) 08/15/2022 0824   CREATININE 5.32 (H) 08/15/2022 0824   CALCIUM  8.9 08/15/2022 0824   CALCIUM  10.2 12/03/2009 1101   GFRNONAA 10 (L) 08/15/2022 0824   GFRAA 9 (L) 02/14/2020 0116    COAGS: Lab Results  Component Value Date   INR 1.1 10/21/2019   INR 1.15 04/16/2018   INR 1.04 03/29/2014     Non-Invasive Vascular Imaging:    N/A   ASSESSMENT/PLAN: This is a 48 y.o. female  with end stage renal disease that presents for fistulogram for right upper arm  loop graft.  States they have been having flow issues for several months.  Discussed plan for fistulogram with possible  intervention including angioplasty and or stent.  Questions answered.  Lonni DOROTHA Gaskins, MD [facility] Office: [phone]  Lonni JINNY Gaskins

## 2024-02-19 NOTE — Op Note (Signed)
    Patient name: [name] MRN: [id number] DOB: 12/22/1975 Sex: female  02/19/2024 Pre-operative Diagnosis: Malfunction right upper arm loop AV graft Post-operative diagnosis:  Same Surgeon:  Lonni DOROTHA Gaskins, MD Procedure Performed: 1.  Ultrasound-guided access right arm AV graft 2.  Right upper extremity fistulogram including central venogram 3.  Peripheral angioplasty of the right upper arm AV graft including the venous anastomosis (7 mm x 80 mm Mustang)  Indications: Patient is a 48 year old female with end-stage renal disease using a right upper arm loop graft.  She presents due to slow flow for fistulogram after risks and benefits discussed.  Findings:   The graft was circumferentially calcified and difficult to access.  Ultimately upper extremity fistulogram showed what looked like a 60% of venous anastomotic stenosis.  The entire loop graft is calcified and diffusely diseased.  The venous side of the graft including the venous anastomosis treated with a 7 mm Mustang.  Widely patent at completion with much better thrill.  No central venous stenosis.   Procedure:  The patient was identified in the holding area and taken to [facility] lab.  Placed on the table in supine position.  The right arm was prepped draped standard sterile fashion.  Timeout was performed.  Went ahead and use ultrasound to evaluate the right upper arm AV graft, it was patent, an image was saved.  This was accessed toward the venous side under ultrasound guidance with a micro access needle and placed a microwire and a micro sheath.  A right upper extremity fistulogram was then obtained including central venogram.  I used a Bentson wire and exchanged for a short 6 [place] sheath.  I got a Bentson wire through the venous anastomosis.  I then treated the venous anastomosis with a 10 mm x 80 mm Mustang to nominal pressure for 2 minutes.  Also treated the venous side of the graft where it was diseased.  Widely patent at  completion.  Much better thrill.  Pursestring was tied down and we removed the sheath.  Taken to holding in stable condition.   Lonni DOROTHA Gaskins, MD [facility] [place] Office: [phone]

## 2024-02-20 DIAGNOSIS — N186 End stage renal disease: Secondary | ICD-10-CM | POA: Diagnosis not present

## 2024-02-20 DIAGNOSIS — Z992 Dependence on renal dialysis: Secondary | ICD-10-CM | POA: Diagnosis not present

## 2024-02-20 DIAGNOSIS — E1011 Type 1 diabetes mellitus with ketoacidosis with coma: Secondary | ICD-10-CM | POA: Diagnosis not present

## 2024-02-20 DIAGNOSIS — E1029 Type 1 diabetes mellitus with other diabetic kidney complication: Secondary | ICD-10-CM | POA: Diagnosis not present

## 2024-02-20 DIAGNOSIS — E1065 Type 1 diabetes mellitus with hyperglycemia: Secondary | ICD-10-CM | POA: Diagnosis not present

## 2024-02-20 DIAGNOSIS — N2581 Secondary hyperparathyroidism of renal origin: Secondary | ICD-10-CM | POA: Diagnosis not present

## 2024-02-20 DIAGNOSIS — D509 Iron deficiency anemia, unspecified: Secondary | ICD-10-CM | POA: Diagnosis not present

## 2024-02-22 ENCOUNTER — Encounter (HOSPITAL_COMMUNITY): Payer: Self-pay | Admitting: Vascular Surgery

## 2024-02-23 DIAGNOSIS — E1065 Type 1 diabetes mellitus with hyperglycemia: Secondary | ICD-10-CM | POA: Diagnosis not present

## 2024-02-23 DIAGNOSIS — N2581 Secondary hyperparathyroidism of renal origin: Secondary | ICD-10-CM | POA: Diagnosis not present

## 2024-02-23 DIAGNOSIS — N186 End stage renal disease: Secondary | ICD-10-CM | POA: Diagnosis not present

## 2024-02-23 DIAGNOSIS — D509 Iron deficiency anemia, unspecified: Secondary | ICD-10-CM | POA: Diagnosis not present

## 2024-02-23 DIAGNOSIS — Z992 Dependence on renal dialysis: Secondary | ICD-10-CM | POA: Diagnosis not present

## 2024-02-23 DIAGNOSIS — E1029 Type 1 diabetes mellitus with other diabetic kidney complication: Secondary | ICD-10-CM | POA: Diagnosis not present

## 2024-02-27 DIAGNOSIS — E1029 Type 1 diabetes mellitus with other diabetic kidney complication: Secondary | ICD-10-CM | POA: Diagnosis not present

## 2024-02-29 DIAGNOSIS — E1022 Type 1 diabetes mellitus with diabetic chronic kidney disease: Secondary | ICD-10-CM | POA: Diagnosis not present

## 2024-03-01 DIAGNOSIS — E1029 Type 1 diabetes mellitus with other diabetic kidney complication: Secondary | ICD-10-CM | POA: Diagnosis not present

## 2024-03-03 DIAGNOSIS — E1011 Type 1 diabetes mellitus with ketoacidosis with coma: Secondary | ICD-10-CM | POA: Diagnosis not present

## 2024-03-03 DIAGNOSIS — N186 End stage renal disease: Secondary | ICD-10-CM | POA: Diagnosis not present

## 2024-03-03 DIAGNOSIS — Z992 Dependence on renal dialysis: Secondary | ICD-10-CM | POA: Diagnosis not present

## 2024-03-03 DIAGNOSIS — E1029 Type 1 diabetes mellitus with other diabetic kidney complication: Secondary | ICD-10-CM | POA: Diagnosis not present

## 2024-03-03 DIAGNOSIS — E1065 Type 1 diabetes mellitus with hyperglycemia: Secondary | ICD-10-CM | POA: Diagnosis not present

## 2024-03-03 DIAGNOSIS — N2581 Secondary hyperparathyroidism of renal origin: Secondary | ICD-10-CM | POA: Diagnosis not present

## 2024-03-03 DIAGNOSIS — D509 Iron deficiency anemia, unspecified: Secondary | ICD-10-CM | POA: Diagnosis not present

## 2024-03-08 DIAGNOSIS — N186 End stage renal disease: Secondary | ICD-10-CM | POA: Diagnosis not present

## 2024-03-15 DIAGNOSIS — E1011 Type 1 diabetes mellitus with ketoacidosis with coma: Secondary | ICD-10-CM | POA: Diagnosis not present

## 2024-03-19 DIAGNOSIS — E1011 Type 1 diabetes mellitus with ketoacidosis with coma: Secondary | ICD-10-CM | POA: Diagnosis not present

## 2024-03-20 DIAGNOSIS — Z992 Dependence on renal dialysis: Secondary | ICD-10-CM | POA: Diagnosis not present

## 2024-03-20 DIAGNOSIS — N186 End stage renal disease: Secondary | ICD-10-CM | POA: Diagnosis not present

## 2024-03-22 DIAGNOSIS — Z992 Dependence on renal dialysis: Secondary | ICD-10-CM | POA: Diagnosis not present

## 2024-03-22 DIAGNOSIS — N2581 Secondary hyperparathyroidism of renal origin: Secondary | ICD-10-CM | POA: Diagnosis not present

## 2024-03-22 DIAGNOSIS — D509 Iron deficiency anemia, unspecified: Secondary | ICD-10-CM | POA: Diagnosis not present

## 2024-03-22 DIAGNOSIS — E1029 Type 1 diabetes mellitus with other diabetic kidney complication: Secondary | ICD-10-CM | POA: Diagnosis not present

## 2024-03-22 DIAGNOSIS — D631 Anemia in chronic kidney disease: Secondary | ICD-10-CM | POA: Diagnosis not present

## 2024-03-22 DIAGNOSIS — I10 Essential (primary) hypertension: Secondary | ICD-10-CM | POA: Diagnosis not present

## 2024-03-22 DIAGNOSIS — E1065 Type 1 diabetes mellitus with hyperglycemia: Secondary | ICD-10-CM | POA: Diagnosis not present

## 2024-03-22 DIAGNOSIS — E1011 Type 1 diabetes mellitus with ketoacidosis with coma: Secondary | ICD-10-CM | POA: Diagnosis not present

## 2024-03-24 ENCOUNTER — Emergency Department (HOSPITAL_BASED_OUTPATIENT_CLINIC_OR_DEPARTMENT_OTHER): Admission: EM | Admit: 2024-03-24 | Discharge: 2024-03-24 | Discharge status: Against Medical Advice

## 2024-03-24 ENCOUNTER — Other Ambulatory Visit: Payer: Self-pay

## 2024-03-24 NOTE — ED Provider Notes (Signed)
 Patient was placed in a room per nursing staff they went to go check vitals and she got upset that they wanted to check vital signs and decided to elope prior to my evaluation.   Faith Cornet, DO 03/24/24 1210

## 2024-03-24 NOTE — ED Notes (Addendum)
 Called-in patient to treatment room in order to triage and initiate treatment . Pt refuses vital signs , rude to staff , cursing at staff , stating  I don't need vitals , all I need is stitch. Pt stated that this is bullshit, opened the door towards staff angrily, and walked out.

## 2024-03-29 DIAGNOSIS — N186 End stage renal disease: Secondary | ICD-10-CM | POA: Diagnosis not present

## 2024-03-29 DIAGNOSIS — E1029 Type 1 diabetes mellitus with other diabetic kidney complication: Secondary | ICD-10-CM | POA: Diagnosis not present

## 2024-03-29 DIAGNOSIS — D509 Iron deficiency anemia, unspecified: Secondary | ICD-10-CM | POA: Diagnosis not present

## 2024-03-29 DIAGNOSIS — E1065 Type 1 diabetes mellitus with hyperglycemia: Secondary | ICD-10-CM | POA: Diagnosis not present

## 2024-03-29 DIAGNOSIS — I10 Essential (primary) hypertension: Secondary | ICD-10-CM | POA: Diagnosis not present

## 2024-03-29 DIAGNOSIS — Z992 Dependence on renal dialysis: Secondary | ICD-10-CM | POA: Diagnosis not present

## 2024-03-29 DIAGNOSIS — D631 Anemia in chronic kidney disease: Secondary | ICD-10-CM | POA: Diagnosis not present

## 2024-03-29 DIAGNOSIS — E1011 Type 1 diabetes mellitus with ketoacidosis with coma: Secondary | ICD-10-CM | POA: Diagnosis not present

## 2024-03-29 DIAGNOSIS — N2581 Secondary hyperparathyroidism of renal origin: Secondary | ICD-10-CM | POA: Diagnosis not present

## 2024-03-31 DIAGNOSIS — Z992 Dependence on renal dialysis: Secondary | ICD-10-CM | POA: Diagnosis not present

## 2024-03-31 DIAGNOSIS — I10 Essential (primary) hypertension: Secondary | ICD-10-CM | POA: Diagnosis not present

## 2024-03-31 DIAGNOSIS — D631 Anemia in chronic kidney disease: Secondary | ICD-10-CM | POA: Diagnosis not present

## 2024-03-31 DIAGNOSIS — E1029 Type 1 diabetes mellitus with other diabetic kidney complication: Secondary | ICD-10-CM | POA: Diagnosis not present

## 2024-03-31 DIAGNOSIS — N186 End stage renal disease: Secondary | ICD-10-CM | POA: Diagnosis not present

## 2024-03-31 DIAGNOSIS — E1011 Type 1 diabetes mellitus with ketoacidosis with coma: Secondary | ICD-10-CM | POA: Diagnosis not present

## 2024-03-31 DIAGNOSIS — N2581 Secondary hyperparathyroidism of renal origin: Secondary | ICD-10-CM | POA: Diagnosis not present

## 2024-03-31 DIAGNOSIS — D509 Iron deficiency anemia, unspecified: Secondary | ICD-10-CM | POA: Diagnosis not present

## 2024-04-02 DIAGNOSIS — N2581 Secondary hyperparathyroidism of renal origin: Secondary | ICD-10-CM | POA: Diagnosis not present

## 2024-04-04 ENCOUNTER — Other Ambulatory Visit: Payer: Self-pay

## 2024-04-04 DIAGNOSIS — I872 Venous insufficiency (chronic) (peripheral): Secondary | ICD-10-CM

## 2024-04-05 DIAGNOSIS — E1065 Type 1 diabetes mellitus with hyperglycemia: Secondary | ICD-10-CM | POA: Diagnosis not present

## 2024-04-05 DIAGNOSIS — N186 End stage renal disease: Secondary | ICD-10-CM | POA: Diagnosis not present

## 2024-04-05 DIAGNOSIS — N2581 Secondary hyperparathyroidism of renal origin: Secondary | ICD-10-CM | POA: Diagnosis not present

## 2024-04-05 DIAGNOSIS — D509 Iron deficiency anemia, unspecified: Secondary | ICD-10-CM | POA: Diagnosis not present

## 2024-04-05 DIAGNOSIS — Z992 Dependence on renal dialysis: Secondary | ICD-10-CM | POA: Diagnosis not present

## 2024-04-05 DIAGNOSIS — D631 Anemia in chronic kidney disease: Secondary | ICD-10-CM | POA: Diagnosis not present

## 2024-04-05 DIAGNOSIS — E1011 Type 1 diabetes mellitus with ketoacidosis with coma: Secondary | ICD-10-CM | POA: Diagnosis not present

## 2024-04-05 DIAGNOSIS — I10 Essential (primary) hypertension: Secondary | ICD-10-CM | POA: Diagnosis not present

## 2024-04-05 DIAGNOSIS — E1029 Type 1 diabetes mellitus with other diabetic kidney complication: Secondary | ICD-10-CM | POA: Diagnosis not present

## 2024-04-06 DIAGNOSIS — E1022 Type 1 diabetes mellitus with diabetic chronic kidney disease: Secondary | ICD-10-CM | POA: Diagnosis not present

## 2024-04-07 DIAGNOSIS — Z992 Dependence on renal dialysis: Secondary | ICD-10-CM | POA: Diagnosis not present

## 2024-04-09 DIAGNOSIS — I10 Essential (primary) hypertension: Secondary | ICD-10-CM | POA: Diagnosis not present

## 2024-04-09 DIAGNOSIS — E1029 Type 1 diabetes mellitus with other diabetic kidney complication: Secondary | ICD-10-CM | POA: Diagnosis not present

## 2024-04-09 DIAGNOSIS — E1011 Type 1 diabetes mellitus with ketoacidosis with coma: Secondary | ICD-10-CM | POA: Diagnosis not present

## 2024-04-09 DIAGNOSIS — D631 Anemia in chronic kidney disease: Secondary | ICD-10-CM | POA: Diagnosis not present

## 2024-04-09 DIAGNOSIS — Z992 Dependence on renal dialysis: Secondary | ICD-10-CM | POA: Diagnosis not present

## 2024-04-09 DIAGNOSIS — D509 Iron deficiency anemia, unspecified: Secondary | ICD-10-CM | POA: Diagnosis not present

## 2024-04-09 DIAGNOSIS — N2581 Secondary hyperparathyroidism of renal origin: Secondary | ICD-10-CM | POA: Diagnosis not present

## 2024-04-09 DIAGNOSIS — N186 End stage renal disease: Secondary | ICD-10-CM | POA: Diagnosis not present

## 2024-04-09 DIAGNOSIS — E1065 Type 1 diabetes mellitus with hyperglycemia: Secondary | ICD-10-CM | POA: Diagnosis not present

## 2024-04-16 DIAGNOSIS — D631 Anemia in chronic kidney disease: Secondary | ICD-10-CM | POA: Diagnosis not present

## 2024-04-16 DIAGNOSIS — D509 Iron deficiency anemia, unspecified: Secondary | ICD-10-CM | POA: Diagnosis not present

## 2024-04-16 DIAGNOSIS — E1011 Type 1 diabetes mellitus with ketoacidosis with coma: Secondary | ICD-10-CM | POA: Diagnosis not present

## 2024-04-16 DIAGNOSIS — N186 End stage renal disease: Secondary | ICD-10-CM | POA: Diagnosis not present

## 2024-04-16 DIAGNOSIS — I10 Essential (primary) hypertension: Secondary | ICD-10-CM | POA: Diagnosis not present

## 2024-04-16 DIAGNOSIS — E1029 Type 1 diabetes mellitus with other diabetic kidney complication: Secondary | ICD-10-CM | POA: Diagnosis not present

## 2024-04-16 DIAGNOSIS — E1065 Type 1 diabetes mellitus with hyperglycemia: Secondary | ICD-10-CM | POA: Diagnosis not present

## 2024-04-16 DIAGNOSIS — N2581 Secondary hyperparathyroidism of renal origin: Secondary | ICD-10-CM | POA: Diagnosis not present

## 2024-04-18 DIAGNOSIS — Z992 Dependence on renal dialysis: Secondary | ICD-10-CM | POA: Diagnosis not present

## 2024-04-18 DIAGNOSIS — E1065 Type 1 diabetes mellitus with hyperglycemia: Secondary | ICD-10-CM | POA: Diagnosis not present

## 2024-04-18 DIAGNOSIS — E1011 Type 1 diabetes mellitus with ketoacidosis with coma: Secondary | ICD-10-CM | POA: Diagnosis not present

## 2024-04-18 DIAGNOSIS — D509 Iron deficiency anemia, unspecified: Secondary | ICD-10-CM | POA: Diagnosis not present

## 2024-04-18 DIAGNOSIS — E1029 Type 1 diabetes mellitus with other diabetic kidney complication: Secondary | ICD-10-CM | POA: Diagnosis not present

## 2024-04-18 DIAGNOSIS — N186 End stage renal disease: Secondary | ICD-10-CM | POA: Diagnosis not present

## 2024-04-18 DIAGNOSIS — D631 Anemia in chronic kidney disease: Secondary | ICD-10-CM | POA: Diagnosis not present

## 2024-04-18 DIAGNOSIS — N2581 Secondary hyperparathyroidism of renal origin: Secondary | ICD-10-CM | POA: Diagnosis not present

## 2024-04-19 DIAGNOSIS — E1029 Type 1 diabetes mellitus with other diabetic kidney complication: Secondary | ICD-10-CM | POA: Diagnosis not present

## 2024-04-19 DIAGNOSIS — N186 End stage renal disease: Secondary | ICD-10-CM | POA: Diagnosis not present

## 2024-04-19 DIAGNOSIS — I10 Essential (primary) hypertension: Secondary | ICD-10-CM | POA: Diagnosis not present

## 2024-04-19 DIAGNOSIS — D509 Iron deficiency anemia, unspecified: Secondary | ICD-10-CM | POA: Diagnosis not present

## 2024-04-19 DIAGNOSIS — E1065 Type 1 diabetes mellitus with hyperglycemia: Secondary | ICD-10-CM | POA: Diagnosis not present

## 2024-04-19 DIAGNOSIS — Z992 Dependence on renal dialysis: Secondary | ICD-10-CM | POA: Diagnosis not present

## 2024-04-19 DIAGNOSIS — N2581 Secondary hyperparathyroidism of renal origin: Secondary | ICD-10-CM | POA: Diagnosis not present

## 2024-04-19 DIAGNOSIS — E1011 Type 1 diabetes mellitus with ketoacidosis with coma: Secondary | ICD-10-CM | POA: Diagnosis not present

## 2024-04-19 DIAGNOSIS — D631 Anemia in chronic kidney disease: Secondary | ICD-10-CM | POA: Diagnosis not present

## 2024-04-27 DIAGNOSIS — E1022 Type 1 diabetes mellitus with diabetic chronic kidney disease: Secondary | ICD-10-CM | POA: Diagnosis not present

## 2024-04-27 DIAGNOSIS — N186 End stage renal disease: Secondary | ICD-10-CM | POA: Diagnosis not present

## 2024-04-27 DIAGNOSIS — E103593 Type 1 diabetes mellitus with proliferative diabetic retinopathy without macular edema, bilateral: Secondary | ICD-10-CM | POA: Diagnosis not present

## 2024-04-27 DIAGNOSIS — Z9641 Presence of insulin pump (external) (internal): Secondary | ICD-10-CM | POA: Diagnosis not present

## 2024-04-27 DIAGNOSIS — Z992 Dependence on renal dialysis: Secondary | ICD-10-CM | POA: Diagnosis not present

## 2024-05-04 ENCOUNTER — Encounter (HOSPITAL_COMMUNITY)

## 2024-05-17 NOTE — Progress Notes (Unsigned)
 [name]                                          MRN: [id number]   05/17/2024   The VBCI Quality Team Specialist reviewed this patient medical record for the purposes of chart review for care gap closure. The following were reviewed: {CHL AMB VBCI QUALITY SPECIALIST REASON FOR REVIEW:[id number]}.    VBCI Quality Team

## 2024-05-23 DIAGNOSIS — N186 End stage renal disease: Secondary | ICD-10-CM | POA: Diagnosis not present

## 2024-05-23 DIAGNOSIS — E782 Mixed hyperlipidemia: Secondary | ICD-10-CM | POA: Diagnosis not present

## 2024-05-23 DIAGNOSIS — Z0001 Encounter for general adult medical examination with abnormal findings: Secondary | ICD-10-CM | POA: Diagnosis not present

## 2024-05-23 DIAGNOSIS — D631 Anemia in chronic kidney disease: Secondary | ICD-10-CM | POA: Diagnosis not present

## 2024-05-23 DIAGNOSIS — Z992 Dependence on renal dialysis: Secondary | ICD-10-CM | POA: Diagnosis not present

## 2024-05-23 DIAGNOSIS — Z794 Long term (current) use of insulin: Secondary | ICD-10-CM | POA: Diagnosis not present

## 2024-05-23 DIAGNOSIS — I1310 Hypertensive heart and chronic kidney disease without heart failure, with stage 1 through stage 4 chronic kidney disease, or unspecified chronic kidney disease: Secondary | ICD-10-CM | POA: Diagnosis not present

## 2024-05-23 DIAGNOSIS — E1122 Type 2 diabetes mellitus with diabetic chronic kidney disease: Secondary | ICD-10-CM | POA: Diagnosis not present

## 2024-06-01 ENCOUNTER — Encounter

## 2024-06-08 ENCOUNTER — Encounter (HOSPITAL_COMMUNITY): Payer: Self-pay | Admitting: Emergency Medicine

## 2024-06-08 ENCOUNTER — Emergency Department (HOSPITAL_COMMUNITY)

## 2024-06-08 ENCOUNTER — Other Ambulatory Visit: Payer: Self-pay

## 2024-06-08 ENCOUNTER — Observation Stay (HOSPITAL_COMMUNITY)
Admission: EM | Admit: 2024-06-08 | Discharge: 2024-06-10 | Disposition: A | Discharge status: Home / Self Care | Attending: Internal Medicine | Admitting: Internal Medicine

## 2024-06-08 DIAGNOSIS — N186 End stage renal disease: Secondary | ICD-10-CM | POA: Insufficient documentation

## 2024-06-08 DIAGNOSIS — J90 Pleural effusion, not elsewhere classified: Secondary | ICD-10-CM | POA: Diagnosis not present

## 2024-06-08 DIAGNOSIS — R9431 Abnormal electrocardiogram [ECG] [EKG]: Secondary | ICD-10-CM

## 2024-06-08 DIAGNOSIS — I5032 Chronic diastolic (congestive) heart failure: Secondary | ICD-10-CM | POA: Diagnosis not present

## 2024-06-08 DIAGNOSIS — I4581 Long QT syndrome: Secondary | ICD-10-CM | POA: Diagnosis not present

## 2024-06-08 DIAGNOSIS — I21A1 Myocardial infarction type 2: Secondary | ICD-10-CM | POA: Diagnosis not present

## 2024-06-08 DIAGNOSIS — Z8673 Personal history of transient ischemic attack (TIA), and cerebral infarction without residual deficits: Secondary | ICD-10-CM | POA: Insufficient documentation

## 2024-06-08 DIAGNOSIS — E785 Hyperlipidemia, unspecified: Secondary | ICD-10-CM | POA: Insufficient documentation

## 2024-06-08 DIAGNOSIS — Z794 Long term (current) use of insulin: Secondary | ICD-10-CM | POA: Insufficient documentation

## 2024-06-08 DIAGNOSIS — I739 Peripheral vascular disease, unspecified: Secondary | ICD-10-CM | POA: Insufficient documentation

## 2024-06-08 DIAGNOSIS — Z79899 Other long term (current) drug therapy: Secondary | ICD-10-CM | POA: Insufficient documentation

## 2024-06-08 DIAGNOSIS — I132 Hypertensive heart and chronic kidney disease with heart failure and with stage 5 chronic kidney disease, or end stage renal disease: Secondary | ICD-10-CM | POA: Insufficient documentation

## 2024-06-08 DIAGNOSIS — E10649 Type 1 diabetes mellitus with hypoglycemia without coma: Principal | ICD-10-CM | POA: Insufficient documentation

## 2024-06-08 DIAGNOSIS — R55 Syncope and collapse: Secondary | ICD-10-CM | POA: Insufficient documentation

## 2024-06-08 DIAGNOSIS — E162 Hypoglycemia, unspecified: Secondary | ICD-10-CM | POA: Diagnosis present

## 2024-06-08 DIAGNOSIS — E1022 Type 1 diabetes mellitus with diabetic chronic kidney disease: Secondary | ICD-10-CM

## 2024-06-08 LAB — COMPREHENSIVE METABOLIC PANEL WITH GFR
ALT: 18 U/L (ref 0–44)
AST: 29 U/L (ref 15–41)
Albumin: 4.3 g/dL (ref 3.5–5.0)
Alkaline Phosphatase: 80 U/L (ref 38–126)
Anion gap: 19 — ABNORMAL HIGH (ref 5–15)
BUN: 28 mg/dL — ABNORMAL HIGH (ref 6–20)
CO2: 27 mmol/L (ref 22–32)
Calcium: 9.9 mg/dL (ref 8.9–10.3)
Chloride: 95 mmol/L — ABNORMAL LOW (ref 98–111)
Creatinine, Ser: 7.9 mg/dL — ABNORMAL HIGH (ref 0.44–1.00)
GFR, Estimated: 6 mL/min — ABNORMAL LOW (ref >60)
Glucose, Bld: 39 mg/dL — CL (ref 70–99)
Potassium: 4.5 mmol/L (ref 3.5–5.1)
Sodium: 141 mmol/L (ref 135–145)
Total Bilirubin: 0.8 mg/dL (ref 0.0–1.2)
Total Protein: 8.4 g/dL — ABNORMAL HIGH (ref 6.5–8.1)

## 2024-06-08 LAB — CBC WITH DIFFERENTIAL/PLATELET
Abs Immature Granulocytes: 0.03 K/uL (ref 0.00–0.07)
Basophils Absolute: 0.1 K/uL (ref 0.0–0.1)
Basophils Relative: 1 %
Eosinophils Absolute: 0.2 K/uL (ref 0.0–0.5)
Eosinophils Relative: 4 %
HCT: 38.4 % (ref 36.0–46.0)
Hemoglobin: 12.4 g/dL (ref 12.0–15.0)
Immature Granulocytes: 1 %
Lymphocytes Relative: 19 %
Lymphs Abs: 1.1 K/uL (ref 0.7–4.0)
MCH: 29 pg (ref 26.0–34.0)
MCHC: 32.3 g/dL (ref 30.0–36.0)
MCV: 89.7 fL (ref 80.0–100.0)
Monocytes Absolute: 0.4 K/uL (ref 0.1–1.0)
Monocytes Relative: 8 %
Neutro Abs: 3.8 K/uL (ref 1.7–7.7)
Neutrophils Relative %: 67 %
Platelets: 153 K/uL (ref 150–400)
RBC: 4.28 MIL/uL (ref 3.87–5.11)
RDW: 15.5 % (ref 11.5–15.5)
WBC: 5.6 K/uL (ref 4.0–10.5)
nRBC: 0 % (ref 0.0–0.2)

## 2024-06-08 LAB — TROPONIN I (HIGH SENSITIVITY)
Troponin I (High Sensitivity): 26 ng/L — ABNORMAL HIGH (ref <18)
Troponin I (High Sensitivity): 27 ng/L — ABNORMAL HIGH (ref <18)

## 2024-06-08 LAB — CBG MONITORING, ED
Glucose-Capillary: 39 mg/dL — CL (ref 70–99)
Glucose-Capillary: 85 mg/dL (ref 70–99)
Glucose-Capillary: 158 mg/dL — ABNORMAL HIGH (ref 70–99)

## 2024-06-08 LAB — MAGNESIUM: Magnesium: 2.5 mg/dL — ABNORMAL HIGH (ref 1.7–2.4)

## 2024-06-08 MED ORDER — DEXTROSE 50 % IV SOLN: 1.0000 | Freq: Once | INTRAVENOUS | Status: DC

## 2024-06-08 MED ORDER — DEXTROSE 10 % IV SOLN: INTRAVENOUS | Status: DC

## 2024-06-08 MED ORDER — DEXTROSE 50 % IV SOLN
INTRAVENOUS | Status: AC
  Administered 2024-06-08: 50 mL
  Filled 2024-06-08: qty 50

## 2024-06-08 MED ORDER — DEXTROSE 50 % IV SOLN: 50.0000 mL | INTRAVENOUS | Status: DC | PRN

## 2024-06-08 NOTE — ED Triage Notes (Signed)
 Pt BIB [facility] from home c/o hyperglycemia, per pt daughter pt had a syncopal episode at home, upon EMS arrival CBG 31, pt given 2 grams of D10 (in arterial IV started by EMS), pt also given 50 of oral glucose, pt dialysis pt, bilateral lower edema, per EMS: NSR HR 76, 170/90, O2 90% RA, 2L 100%, last CBG 68

## 2024-06-08 NOTE — ED Provider Notes (Signed)
 Emergency Department Provider Note   I have reviewed the triage vital signs and the nursing notes.   HISTORY  Chief Complaint Hypoglycemia   HPI [name] is a 48 y.o. female past history reviewed below including type 1 diabetes and ESRD presents to the emergency department for evaluation of syncope and hypoglycemia.  Patient had a witnessed syncope event at home.  EMS arrived and checked blood sugar which was 31.  They gave 2 g of D10 by IV they started on scene which was ultimately determined to be arterial placement.  She was given 50 of oral glucose as well.  Patient denies any discomfort but does appear somnolent with return of hypoglycemia in the ED. Level 5 caveat: AMS  Daughter arrived at bedside and reported that since the patient was seen by her endocrinologist in the past couple of weeks they potentially change settings on her insulin  pump and she has been having low sugars since that time but none as low as today.    Past Medical History:  Diagnosis Date   Anemia    Arthritis    Ascites 03/28/2020   Bacteremia 02/11/2020   Cerebral infarction due to unspecified mechanism 05/22/2014   CHF (congestive heart failure) (HCC)    Diabetes mellitus    Diabetic retinopathy (HCC)    Hx of laser Rx's   DM type 1 (diabetes mellitus, type 1) (HCC) 09/12/2014   Age of onset for DM type 1 was age 40.      Enlarged thyroid  gland    ESRD on hemodialysis (HCC)    ESRD due to DM type I, age of onset DM 1 was age 47.  Went on dialysis in March 2012.  Gets HD now at [facility] on a TTS schedule.     ESRD on hemodialysis (HCC) 09/12/2014   ESRD due to DM type 1.  Started HD in 2012 at [facility].  Gets HD there on a TTS schedule.     GERD (gastroesophageal reflux disease)    HCAP (healthcare-associated pneumonia) 09/11/2014   Hemorrhage from arteriovenous dialysis graft 10/20/2019   History of blood transfusion    Hyperlipidemia    Hypertension    Hypertensive emergency  05/22/2014   Peripheral vascular disease    Pneumonia     Review of Systems  Level 5 caveat: AMS ____________________________________________   PHYSICAL EXAM:  VITAL SIGNS: ED Triage Vitals  Encounter Vitals Group     BP 06/08/24 1954 (!) 175/96     Pulse Rate 06/08/24 1954 68     Resp 06/08/24 1957 15     Temp --      Temp src --      SpO2 06/08/24 1954 97 %     Weight 06/08/24 1954 121 lb (54.9 kg)     Height 06/08/24 1954 5' 3 (1.6 m)   Constitutional: Drowsy but arouses to voice.  Eyes: Conjunctivae are normal.  Head: Atraumatic. Nose: No congestion/rhinnorhea. Mouth/Throat: Mucous membranes are moist.   Neck: No stridor. Cardiovascular: Normal rate, regular rhythm. Good peripheral circulation. Grossly normal heart sounds.   Respiratory: Normal respiratory effort.  No retractions. Lungs CTAB. Gastrointestinal: Soft and nontender. No distention.  Musculoskeletal: No gross deformities of extremities. Neurologic:  Normal speech and language. No gross focal neurologic deficits are appreciated.  Skin:  Skin is warm, dry and intact. No rash noted.  ____________________________________________   LABS (all labs ordered are listed, but only abnormal results are displayed)  Labs Reviewed  COMPREHENSIVE METABOLIC PANEL WITH GFR - Abnormal; Notable for the following components:      Result Value   Chloride 95 (*)    Glucose, Bld 39 (*)    BUN 28 (*)    Creatinine, Ser 7.90 (*)    Total Protein 8.4 (*)    GFR, Estimated 6 (*)    Anion gap 19 (*)    All other components within normal limits  MAGNESIUM - Abnormal; Notable for the following components:   Magnesium 2.5 (*)    All other components within normal limits  CBG MONITORING, ED - Abnormal; Notable for the following components:   Glucose-Capillary 39 (*)    All other components within normal limits  CBG MONITORING, ED - Abnormal; Notable for the following components:   Glucose-Capillary 158 (*)    All other  components within normal limits  TROPONIN I (HIGH SENSITIVITY) - Abnormal; Notable for the following components:   Troponin I (High Sensitivity) 26 (*)    All other components within normal limits  CBC WITH DIFFERENTIAL/PLATELET  CBG MONITORING, ED  CBG MONITORING, ED  TROPONIN I (HIGH SENSITIVITY)   ____________________________________________  EKG   EKG Interpretation Date/Time:  Wednesday June 08 2024 21:28:22 EST Ventricular Rate:  68 PR Interval:  169 QRS Duration:  97 QT Interval:  646 QTC Calculation: 624 R Axis:   68  Text Interpretation: Sinus rhythm Supraventricular bigeminy Repol abnrm, global ischemia, diffuse leads Confirmed by Darra Chew [phone]) on 06/08/2024 9:42:59 PM        ____________________________________________  RADIOLOGY  CT Head Wo Contrast Result Date: 06/08/2024 EXAM: CT HEAD WITHOUT CONTRAST 06/08/2024 08:35:00 PM TECHNIQUE: CT of the head was performed without the administration of intravenous contrast. Automated exposure control, iterative reconstruction, and/or weight based adjustment of the mA/kV was utilized to reduce the radiation dose to as low as reasonably achievable. COMPARISON: CT head 12/25/2014 CLINICAL HISTORY: Head trauma, moderate-severe FINDINGS: BRAIN AND VENTRICLES: No acute hemorrhage. No evidence of acute infarct. No hydrocephalus. No extra-axial collection. No mass effect or midline shift. ORBITS: No acute abnormality. SINUSES: No acute abnormality. SOFT TISSUES AND SKULL: No acute soft tissue abnormality. No skull fracture. IMPRESSION: 1. No acute intracranial abnormality. Electronically signed by: Gilmore Molt MD 06/08/2024 08:51 PM EST RP Workstation: [id number]   DG Chest Portable 1 View Result Date: 06/08/2024 EXAM: 1 VIEW(S) XRAY OF THE CHEST 06/08/2024 08:18:00 PM COMPARISON: CXR 08/06/2022. ct chest 05/31/20 CLINICAL HISTORY: syncope FINDINGS: LUNGS AND PLEURA: Stable versus recurrent small-to-moderate right  pleural effusion. Superimposed right lower lobe atelectasis and/or airspace opacity. Diffuse mild interstitial prominence. No pneumothorax. HEART AND MEDIASTINUM: Unchanged cardiomediastinal silhouette. Mild cardiomegaly. Aortic arch calcifications noted. Prominent central pulmonary vessels. BONES AND SOFT TISSUES: Left axillary surgical clips noted. No acute osseous abnormality. IMPRESSION: 1. Small-to-moderate right pleural effusion with superimposed right lower lobe atelectasis and/or airspace opacity.Follow-up PA and lateral chest X-ray is recommended in 3-4 weeks following therapy to ensure resolution and exclude underlying malignancy. Electronically signed by: Morgane Naveau MD 06/08/2024 08:41 PM EST RP Workstation: [id number]    ____________________________________________   PROCEDURES  Procedure(s) performed:   Procedures  CRITICAL CARE Performed by: Chew KANDICE Darra Total critical care time: 35 minutes Critical care time was exclusive of separately billable procedures and treating other patients. Critical care was necessary to treat or prevent imminent or life-threatening deterioration. Critical care was time spent personally by me on the following activities: development of treatment plan with patient and/or surrogate as well as nursing, discussions  with consultants, evaluation of patient's response to treatment, examination of patient, obtaining history from patient or surrogate, ordering and performing treatments and interventions, ordering and review of laboratory studies, ordering and review of radiographic studies, pulse oximetry and re-evaluation of patient's condition.  Fonda Law, MD Emergency Medicine  ____________________________________________   INITIAL IMPRESSION / ASSESSMENT AND PLAN / ED COURSE  Pertinent labs & imaging results that were available during my care of the patient were reviewed by me and considered in my medical decision making (see chart for  details).   This patient is Presenting for Evaluation of AMS, which does require a range of treatment options, and is a complaint that involves a high risk of morbidity and mortality.  The Differential Diagnoses includes but is not exclusive to alcohol , illicit or prescription medications, intracranial pathology such as stroke, intracerebral hemorrhage, fever or infectious causes including sepsis, hypoxemia, uremia, trauma, endocrine related disorders such as diabetes, hypoglycemia, thyroid -related diseases, etc.   Critical Interventions-    Medications  dextrose  10 % infusion ( Intravenous New Bag/Given 06/08/24 2125)  dextrose  50 % solution 50 mL (has no administration in time range)  dextrose  50 % solution (50 mLs  Given 06/08/24 2011)    Reassessment after intervention:  patient is awake and alert.    I did obtain Additional Historical Information from EMS.    Clinical Laboratory Tests Ordered, included CBC without leukocytosis or anemia.  Initial blood sugar 39.   Radiologic Tests Ordered, included CXR. I independently interpreted the images and agree with radiology interpretation.   Cardiac Monitor Tracing which shows NSR.    Social Determinants of Health Risk patient is a non-smoker.   Consult complete with Nephrology, Dr. Gearline. They can coordinate for HD while inpatient.   Medical Decision Making: Summary:  The patient presents to the emergency department for evaluation of syncope.  Found to be hypoglycemic.  Established venous access and gave D50 IV.  Plan for screening blood work, CT imaging of the head and reassess.  Reevaluation with update and discussion with patient. She is more awake and alert. Plan for admit. She is in agreement.   Patient's presentation is most consistent with acute presentation with potential threat to life or bodily function.   Disposition: admit  ____________________________________________  FINAL CLINICAL IMPRESSION(S) / ED  DIAGNOSES  Final diagnoses:  Hypoglycemia  Syncope, unspecified syncope type   Note:  This document was prepared using Dragon voice recognition software and may include unintentional dictation errors.  Fonda Law, MD, [facility] Emergency Medicine    Krystalynn Ridgeway, Fonda MATSU, MD 06/08/24 [phone]

## 2024-06-08 NOTE — H&P (Signed)
 History and Physical    [name] FMW:[id number] DOB: Nov 07, 1975 DOA: 06/08/2024  PCP: Catalina Bare, MD  Patient coming from: Home  Chief Complaint: Hypoglycemia and syncope  HPI: [name] is a 48 y.o. female with medical history significant of ESRD on HD TTS, HFpEF, diabetes on insulin  pump, hypertension, hyperlipidemia, PVD, CVA, GERD, anemia, arthritis presented to the ED via EMS for evaluation of hypoglycemia and syncope.  Patient had a witnessed syncopal episode at home in the ED and EMS arrived her CBG was 31.  They gave her 2 g of D10 via IV which was started on scene and was ultimately determined to be arterial placement.  She was given 50 of oral dextrose  as well.  Patient's daughter told the ED physician that since after patient was seen by her endocrinologist in the past few weeks, they potentially changed settings on her insulin  pump and patient has been having low glucose since that time but none as low as today.  In the ED, patient noted to be hypertensive with SBP 170-180s.  Afebrile.  Labs showing no leukocytosis or anemia, glucose 39, potassium 4.5, bicarb 27, troponin 26> 27, magnesium 2.5.  Chest x-ray showing small to moderate right pleural effusion with superimposed right lower lobe atelectasis and/or airspace opacity.  Follow-up PA and lateral chest x-ray is recommended in 3 to 4 weeks following therapy to ensure resolution and exclude underlying malignancy.  CT head showing no acute intracranial abnormality.  EKG showing sinus rhythm, baseline wander in V3, T wave inversions inferolaterally, and QTc 624.  No acute ischemic changes in comparison to previous EKGs.  Patient was given 1 amp of D50 and subsequently started on D10 infusion.  Nephrology consulted by ED physician for routine dialysis in the morning.  TRH called to admit.   Patient states she has been using insulin  pump for over 5 years.  States she saw her endocrinologist sometime last month and  the dose of her insulin  was increased.  Since then, she has had recurrent hypoglycemia at home.  States today she was about to go eat dinner with her daughter when she felt weak and then passed out.  States her daughter removed her insulin  pump prior to ED arrival.  She has no other complaints.  Denies fevers, cough, shortness of breath, chest pain, nausea, vomiting, abdominal pain, or diarrhea.  She denies missing any dialysis treatments.  She did miss taking her home blood pressure medications tonight due to being in the ED.  Review of Systems:  Review of Systems  All other systems reviewed and are negative.   Past Medical History:  Diagnosis Date   Anemia    Arthritis    Ascites 03/28/2020   Bacteremia 02/11/2020   Cerebral infarction due to unspecified mechanism 05/22/2014   CHF (congestive heart failure) (HCC)    Diabetes mellitus    Diabetic retinopathy (HCC)    Hx of laser Rx's   DM type 1 (diabetes mellitus, type 1) (HCC) 09/12/2014   Age of onset for DM type 1 was age 42.      Enlarged thyroid  gland    ESRD on hemodialysis (HCC)    ESRD due to DM type I, age of onset DM 1 was age 24.  Went on dialysis in March 2012.  Gets HD now at [facility] on a TTS schedule.     ESRD on hemodialysis (HCC) 09/12/2014   ESRD due to DM type 1.  Started HD in 2012 at [facility].  Gets HD there on a TTS schedule.     GERD (gastroesophageal reflux disease)    HCAP (healthcare-associated pneumonia) 09/11/2014   Hemorrhage from arteriovenous dialysis graft 10/20/2019   History of blood transfusion    Hyperlipidemia    Hypertension    Hypertensive emergency 05/22/2014   Peripheral vascular disease    Pneumonia     Past Surgical History:  Procedure Laterality Date   A/V FISTULAGRAM Right 10/31/2020   Procedure: A/V FISTULAGRAM;  Surgeon: Magda Debby SAILOR, MD;  Location: [facility];  Service: Cardiovascular;  Laterality: Right;   A/V SHUNT INTERVENTION Right 02/19/2024   Procedure:  A/V SHUNT INTERVENTION;  Surgeon: Gretta Lonni PARAS, MD;  Location: [facility] PV LAB;  Service: Cardiovascular;  Laterality: Right;   ARTERIOVENOUS GRAFT PLACEMENT     ARTERY REPAIR Left 12/10/2012   Procedure: BRACHIAL ARTERY REPAIR;  Surgeon: Lonni GORMAN Blade, MD;  Location: [facility] OR;  Service: Vascular;  Laterality: Left;  Exploration Left Brachial Artery for AVF   AV FISTULA PLACEMENT Right 01/11/2018   Procedure: INSERTION OF ARTERIOVENOUS (AV) GORE-TEX GRAFT  UPPER ARM;  Surgeon: Blade Lonni GORMAN, MD;  Location: [facility] OR;  Service: Vascular;  Laterality: Right;   AVGG REMOVAL Left 08/12/2022   Procedure: LEFT ARM ARTERIOVENOUS GRAFT EXCISION WITH VEIN PATCH ANGIOPLASTY;  Surgeon: Sheree Penne Lonni, MD;  Location: [facility] OR;  Service: Vascular;  Laterality: Left;   BASCILIC VEIN TRANSPOSITION Right 09/28/2017   Procedure: FIRST STAGE BASILIC VEIN TRANSPOSITION;  Surgeon: Blade Lonni GORMAN, MD;  Location: [facility] OR;  Service: Vascular;  Laterality: Right;   CESAREAN SECTION     CHEST TUBE INSERTION Right 06/01/2020   Procedure: CHEST TUBE INSERTION;  Surgeon: Brenna Adine CROME, DO;  Location: [facility] ENDOSCOPY;  Service: Pulmonary;  Laterality: Right;   CHEST TUBE INSERTION Right 07/04/2020   Procedure: INSERTION PLEURAL DRAINAGE CATHETER;  Surgeon: Kerrin Elspeth BROCKS, MD;  Location: [facility] OR;  Service: Thoracic;  Laterality: Right;   EYE SURGERY     LAzer   EYE SURGERY Left    IR THORACENTESIS ASP PLEURAL SPACE W/IMG GUIDE  05/31/2020   IR THORACENTESIS ASP PLEURAL SPACE W/IMG GUIDE  06/22/2020   REVISION OF ARTERIOVENOUS GORETEX GRAFT Left 08/01/2016   Procedure: REVISION OF ARTERIOVENOUS GORETEX GRAFT;  Surgeon: Lonni GORMAN Blade, MD;  Location: [facility] OR;  Service: Vascular;  Laterality: Left;   REVISION OF ARTERIOVENOUS GORETEX GRAFT Right 10/21/2019   Procedure: REVISION OF ARTERIOVENOUS GORETEX GRAFT ARM;  Surgeon: Oris Krystal FALCON, MD;  Location: [facility] OR;  Service: Vascular;  Laterality:  Right;   RIGHT HEART CATH N/A 08/01/2020   Procedure: RIGHT HEART CATH;  Surgeon: Cherrie Toribio SAUNDERS, MD;  Location: [facility];  Service: Cardiovascular;  Laterality: N/A;   SHUNTOGRAM Left 11/30/2013   Procedure: RICH;  Surgeon: Gaile LELON New, MD;  Location: [facility] CATH LAB;  Service: Cardiovascular;  Laterality: Left;   TEE WITHOUT CARDIOVERSION N/A 02/13/2020   Procedure: TRANSESOPHAGEAL ECHOCARDIOGRAM (TEE);  Surgeon: Delford Maude BROCKS, MD;  Location: [facility] ENDOSCOPY;  Service: Cardiovascular;  Laterality: N/A;   TEE WITHOUT CARDIOVERSION N/A 08/15/2022   Procedure: TRANSESOPHAGEAL ECHOCARDIOGRAM (TEE);  Surgeon: Alvan Ronal BRAVO, MD;  Location: [facility] ENDOSCOPY;  Service: Cardiovascular;  Laterality: N/A;   VENOUS ANGIOPLASTY  02/19/2024   Procedure: VENOUS ANGIOPLASTY;  Surgeon: Gretta Lonni PARAS, MD;  Location: [facility] PV LAB;  Service: Cardiovascular;;  Basilic Intragraft/Venous Anastomosis     reports that she has never smoked. She has  never used smokeless tobacco. She reports that she does not drink alcohol  and does not use drugs.  Allergies  Allergen Reactions   Pollen Extract Other (See Comments)    Watery eyes    Family History  Problem Relation Age of Onset   Cancer Mother        breast   Hypertension Father     Prior to Admission medications   Medication Sig Start Date End Date Taking? Authorizing Provider  amLODipine  (NORVASC ) 10 MG tablet Take 10 mg by mouth at bedtime.   Yes [provider]  labetalol  (NORMODYNE ) 300 MG tablet Take 300 mg by mouth at bedtime.   Yes [provider]  Lanthanum Carbonate (FOSRENOL) 1000 MG PACK Take 1,000 mg by mouth with breakfast, with lunch, and with evening meal.   Yes [provider]  NOVOLOG  100 UNIT/ML injection Inject 1-5 Units into the skin with breakfast, with lunch, and with evening meal. Per sliding scale 10/10/20  Yes [provider]  Pediatric Multivit-Minerals-C (FLINTSTONES GUMMIES PO)  Take 2 tablets by mouth in the morning.   Yes [provider]  rosuvastatin  (CRESTOR ) 10 MG tablet Take 10 mg by mouth at bedtime.   Yes [provider]  loratadine  (CLARITIN ) 10 MG tablet Take 10 mg by mouth daily as needed for allergies. Patient not taking: Reported on 06/08/2024    [provider]    Physical Exam: Vitals:   06/08/24 2145 06/08/24 2200 06/08/24 2215 06/08/24 2249  BP: (!) 197/93 (!) 158/82 (!) 172/83 (!) 180/91  Pulse: 67 64 65 68  Resp: 19 16 14 15   Temp:      TempSrc:      SpO2: 95% 94% 93% 95%  Weight:      Height:        Physical Exam Vitals reviewed.  Constitutional:      General: She is not in acute distress. HENT:     Head: Normocephalic and atraumatic.  Eyes:     Extraocular Movements: Extraocular movements intact.  Cardiovascular:     Rate and Rhythm: Normal rate and regular rhythm.  Pulmonary:     Effort: Pulmonary effort is normal. No respiratory distress.     Breath sounds: No wheezing or rales.  Abdominal:     General: Bowel sounds are normal.     Palpations: Abdomen is soft.     Tenderness: There is no abdominal tenderness. There is no guarding.  Musculoskeletal:     Cervical back: Normal range of motion.     Right lower leg: No edema.     Left lower leg: No edema.  Skin:    General: Skin is warm and dry.  Neurological:     General: No focal deficit present.     Mental Status: She is alert and oriented to person, place, and time.     Labs on Admission: I have personally reviewed following labs and imaging studies  CBC: Recent Labs  Lab 06/08/24 2010  WBC 5.6  NEUTROABS 3.8  HGB 12.4  HCT 38.4  MCV 89.7  PLT 153   Basic Metabolic Panel: Recent Labs  Lab 06/08/24 2010  NA 141  K 4.5  CL 95*  CO2 27  GLUCOSE 39*  BUN 28*  CREATININE 7.90*  CALCIUM  9.9  MG 2.5*   GFR: Estimated Creatinine Clearance: 7.4 mL/min (A) (by C-G formula based on SCr of 7.9 mg/dL (H)). Liver Function  Tests: Recent Labs  Lab 06/08/24 2010  AST 29  ALT 18  ALKPHOS 80  BILITOT 0.8  PROT 8.4*  ALBUMIN  4.3   No results for input(s): LIPASE, AMYLASE in the last 168 hours. No results for input(s): AMMONIA in the last 168 hours. Coagulation Profile: No results for input(s): INR, PROTIME in the last 168 hours. Cardiac Enzymes: No results for input(s): CKTOTAL, CKMB, CKMBINDEX, TROPONINI in the last 168 hours. BNP (last 3 results) No results for input(s): PROBNP in the last 8760 hours. HbA1C: No results for input(s): HGBA1C in the last 72 hours. CBG: Recent Labs  Lab 06/08/24 1958 06/08/24 2121 06/08/24 2246  GLUCAP 39* 85 158*   Lipid Profile: No results for input(s): CHOL, HDL, LDLCALC, TRIG, CHOLHDL, LDLDIRECT in the last 72 hours. Thyroid  Function Tests: No results for input(s): TSH, T4TOTAL, FREET4, T3FREE, THYROIDAB in the last 72 hours. Anemia Panel: No results for input(s): VITAMINB12, FOLATE, FERRITIN, TIBC, IRON, RETICCTPCT in the last 72 hours. Urine analysis:    Component Value Date/Time   COLORURINE YELLOW 03/29/2014 1734   APPEARANCEUR CLEAR 03/29/2014 1734   LABSPEC 1.015 03/29/2014 1734   PHURINE 8.0 03/29/2014 1734   GLUCOSEU >1000 (A) 03/29/2014 1734   HGBUR SMALL (A) 03/29/2014 1734   BILIRUBINUR NEGATIVE 03/29/2014 1734   KETONESUR NEGATIVE 03/29/2014 1734   PROTEINUR 100 (A) 03/29/2014 1734   UROBILINOGEN 0.2 03/29/2014 1734   NITRITE NEGATIVE 03/29/2014 1734   LEUKOCYTESUR TRACE (A) 03/29/2014 1734    Radiological Exams on Admission: CT Head Wo Contrast Result Date: 06/08/2024 EXAM: CT HEAD WITHOUT CONTRAST 06/08/2024 08:35:00 PM TECHNIQUE: CT of the head was performed without the administration of intravenous contrast. Automated exposure control, iterative reconstruction, and/or weight based adjustment of the mA/kV was utilized to reduce the radiation dose to as low as reasonably  achievable. COMPARISON: CT head 12/25/2014 CLINICAL HISTORY: Head trauma, moderate-severe FINDINGS: BRAIN AND VENTRICLES: No acute hemorrhage. No evidence of acute infarct. No hydrocephalus. No extra-axial collection. No mass effect or midline shift. ORBITS: No acute abnormality. SINUSES: No acute abnormality. SOFT TISSUES AND SKULL: No acute soft tissue abnormality. No skull fracture. IMPRESSION: 1. No acute intracranial abnormality. Electronically signed by: Gilmore Molt MD 06/08/2024 08:51 PM EST RP Workstation: [id number]   DG Chest Portable 1 View Result Date: 06/08/2024 EXAM: 1 VIEW(S) XRAY OF THE CHEST 06/08/2024 08:18:00 PM COMPARISON: CXR 08/06/2022. ct chest 05/31/20 CLINICAL HISTORY: syncope FINDINGS: LUNGS AND PLEURA: Stable versus recurrent small-to-moderate right pleural effusion. Superimposed right lower lobe atelectasis and/or airspace opacity. Diffuse mild interstitial prominence. No pneumothorax. HEART AND MEDIASTINUM: Unchanged cardiomediastinal silhouette. Mild cardiomegaly. Aortic arch calcifications noted. Prominent central pulmonary vessels. BONES AND SOFT TISSUES: Left axillary surgical clips noted. No acute osseous abnormality. IMPRESSION: 1. Small-to-moderate right pleural effusion with superimposed right lower lobe atelectasis and/or airspace opacity.Follow-up PA and lateral chest X-ray is recommended in 3-4 weeks following therapy to ensure resolution and exclude underlying malignancy. Electronically signed by: Morgane Naveau MD 06/08/2024 08:41 PM EST RP Workstation: [id number]    Assessment and Plan  Hypoglycemia Diabetes on insulin  pump Patient presenting with severe hypoglycemia and in the setting of recent change in settings of insulin  pump/increase in dose of basal insulin .  She is followed by outpatient endocrinology.  She was given an amp of D50 and subsequently started on D10 infusion in the ED due to persistent hypoglycemia.  Insulin  pump was removed prior to ED  arrival.  Most recent CBG 158.  Will decrease rate of D10 infusion and continue CBG checks every 2 hours.  Hemoglobin A1c 5.4  on 04/27/2024.  Placed on renal diet.  Diabetes coordinator consulted.  Syncope Likely due to severe hypoglycemia.  CT head showing no acute intracranial abnormality.  EKG without acute ischemic changes.  Troponin mildly elevated in the setting of ESRD but stable, not consistent with ACS.  Patient is not endorsing chest pain.  No tachycardia or hypoxia to suggest PE.  Currently satting well on room air.  Continue management of hypoglycemia as above.  Continue cardiac monitoring.  Small to moderate right pleural effusion ?Pneumonia Chest x-ray showing small to moderate right pleural effusion with superimposed right lower lobe atelectasis and/or airspace opacity.  Follow-up PA and lateral chest x-ray is recommended in 3 to 4 weeks following therapy to ensure resolution and exclude underlying malignancy.  Pneumonia less likely given no fever or leukocytosis.  Patient is not endorsing any respiratory symptoms.  Not tachypneic or hypoxic.  Check procalcitonin level and test for COVID/flu/RSV.  Check BNP.  QT prolongation No evidence of hypokalemia or hypomagnesemia on labs.  Continue cardiac monitoring and avoid QT prolonging drugs.  ESRD on HD TTS Nephrology consulted by ED physician for routine dialysis in the morning.  Chronic HFpEF Echo done in January 2024 showing EF 55 to 60%, grade 2 diastolic dysfunction, moderately elevated pulmonary artery systolic pressure, and moderate tricuspid regurgitation.  Volume management with dialysis as above.  Hypertension SBP currently 180-190s.  Patient missed taking her home antihypertensives today.  Resume amlodipine  and labetalol .  IV hydralazine  PRN SBP >160.  Hyperlipidemia PVD History of CVA Continue Crestor .  DVT prophylaxis: SQ Heparin  Code Status: Full Code (discussed with the patient) Family Communication: No family  available at this time.  Level of care: Progressive Care Unit Admission status: It is my clinical opinion that referral for OBSERVATION is reasonable and necessary in this patient based on the above information provided. The aforementioned taken together are felt to place the patient at high risk for further clinical deterioration. However, it is anticipated that the patient may be medically stable for discharge from the hospital within 24 to 48 hours.  Editha Ram MD [facility]  If 7PM-7AM, please contact night-coverage [email or web address]  06/08/2024, 11:45 PM

## 2024-06-09 DIAGNOSIS — R55 Syncope and collapse: Secondary | ICD-10-CM

## 2024-06-09 DIAGNOSIS — R9431 Abnormal electrocardiogram [ECG] [EKG]: Secondary | ICD-10-CM

## 2024-06-09 DIAGNOSIS — E162 Hypoglycemia, unspecified: Principal | ICD-10-CM | POA: Diagnosis present

## 2024-06-09 LAB — RENAL FUNCTION PANEL
Albumin: 3.8 g/dL (ref 3.5–5.0)
Anion gap: 19 — ABNORMAL HIGH (ref 5–15)
BUN: 34 mg/dL — ABNORMAL HIGH (ref 6–20)
CO2: 25 mmol/L (ref 22–32)
Calcium: 9.3 mg/dL (ref 8.9–10.3)
Chloride: 90 mmol/L — ABNORMAL LOW (ref 98–111)
Creatinine, Ser: 9.11 mg/dL — ABNORMAL HIGH (ref 0.44–1.00)
GFR, Estimated: 5 mL/min — ABNORMAL LOW (ref >60)
Glucose, Bld: 252 mg/dL — ABNORMAL HIGH (ref 70–99)
Phosphorus: 8 mg/dL — ABNORMAL HIGH (ref 2.5–4.6)
Potassium: 5 mmol/L (ref 3.5–5.1)
Sodium: 134 mmol/L — ABNORMAL LOW (ref 135–145)

## 2024-06-09 LAB — RESP PANEL BY RT-PCR (RSV, FLU A&B, COVID)  RVPGX2
Influenza A by PCR: NEGATIVE
Influenza B by PCR: NEGATIVE
Resp Syncytial Virus by PCR: NEGATIVE
SARS Coronavirus 2 by RT PCR: NEGATIVE

## 2024-06-09 LAB — MRSA NEXT GEN BY PCR, NASAL: MRSA by PCR Next Gen: NOT DETECTED

## 2024-06-09 LAB — GLUCOSE, CAPILLARY
Glucose-Capillary: 191 mg/dL — ABNORMAL HIGH (ref 70–99)
Glucose-Capillary: 153 mg/dL — ABNORMAL HIGH (ref 70–99)
Glucose-Capillary: 108 mg/dL — ABNORMAL HIGH (ref 70–99)

## 2024-06-09 LAB — CBG MONITORING, ED
Glucose-Capillary: 176 mg/dL — ABNORMAL HIGH (ref 70–99)
Glucose-Capillary: 127 mg/dL — ABNORMAL HIGH (ref 70–99)
Glucose-Capillary: 135 mg/dL — ABNORMAL HIGH (ref 70–99)

## 2024-06-09 LAB — BRAIN NATRIURETIC PEPTIDE: B Natriuretic Peptide: 1229 pg/mL — ABNORMAL HIGH (ref 0.0–100.0)

## 2024-06-09 LAB — PROCALCITONIN: Procalcitonin: 1.23 ng/mL

## 2024-06-09 LAB — CBC
HCT: 31.6 % — ABNORMAL LOW (ref 36.0–46.0)
Hemoglobin: 10.3 g/dL — ABNORMAL LOW (ref 12.0–15.0)
MCH: 28.8 pg (ref 26.0–34.0)
MCHC: 32.6 g/dL (ref 30.0–36.0)
MCV: 88.3 fL (ref 80.0–100.0)
Platelets: 151 K/uL (ref 150–400)
RBC: 3.58 MIL/uL — ABNORMAL LOW (ref 3.87–5.11)
RDW: 15.3 % (ref 11.5–15.5)
WBC: 4.9 K/uL (ref 4.0–10.5)
nRBC: 0 % (ref 0.0–0.2)

## 2024-06-09 LAB — HIV ANTIBODY (ROUTINE TESTING W REFLEX): HIV Screen 4th Generation wRfx: NONREACTIVE

## 2024-06-09 LAB — HEPATITIS B SURFACE ANTIGEN: Hepatitis B Surface Ag: NONREACTIVE

## 2024-06-09 MED ORDER — ALTEPLASE 2 MG IJ SOLR: 2.0000 mg | Freq: Once | INTRAMUSCULAR | Status: DC | PRN

## 2024-06-09 MED ORDER — HYDRALAZINE HCL 20 MG/ML IJ SOLN
5.0000 mg | INTRAMUSCULAR | Status: DC | PRN
  Administered 2024-06-09 (×2): 5 mg via INTRAVENOUS
  Filled 2024-06-09: qty 1

## 2024-06-09 MED ORDER — CHLORHEXIDINE GLUCONATE CLOTH 2 % EX PADS
6.0000 | MEDICATED_PAD | Freq: Every day | CUTANEOUS | Status: DC
  Administered 2024-06-10: 6 via TOPICAL

## 2024-06-09 MED ORDER — NEPRO/CARBSTEADY PO LIQD: 237.0000 mL | ORAL | Status: DC | PRN

## 2024-06-09 MED ORDER — LIDOCAINE-PRILOCAINE 2.5-2.5 % EX CREA: 1.0000 | TOPICAL_CREAM | CUTANEOUS | Status: DC | PRN

## 2024-06-09 MED ORDER — LABETALOL HCL 200 MG PO TABS
300.0000 mg | ORAL_TABLET | Freq: Every day | ORAL | Status: DC
  Administered 2024-06-09 (×2): 300 mg via ORAL
  Filled 2024-06-09 (×2): qty 2

## 2024-06-09 MED ORDER — PENTAFLUOROPROP-TETRAFLUOROETH EX AERO: 1.0000 | INHALATION_SPRAY | CUTANEOUS | Status: DC | PRN

## 2024-06-09 MED ORDER — ANTICOAGULANT SODIUM CITRATE 4% (200MG/5ML) IV SOLN: 5.0000 mL | Status: DC | PRN

## 2024-06-09 MED ORDER — ACETAMINOPHEN 650 MG RE SUPP: 650.0000 mg | Freq: Four times a day (QID) | RECTAL | Status: DC | PRN

## 2024-06-09 MED ORDER — AMLODIPINE BESYLATE 10 MG PO TABS
10.0000 mg | ORAL_TABLET | Freq: Every day | ORAL | Status: DC
  Administered 2024-06-09 (×2): 10 mg via ORAL
  Filled 2024-06-09: qty 1
  Filled 2024-06-09: qty 2

## 2024-06-09 MED ORDER — INSULIN PUMP
SUBCUTANEOUS | Status: DC
  Filled 2024-06-09: qty 1

## 2024-06-09 MED ORDER — HYDRALAZINE HCL 20 MG/ML IJ SOLN
INTRAMUSCULAR | Status: AC
  Filled 2024-06-09: qty 1

## 2024-06-09 MED ORDER — LANTHANUM CARBONATE 500 MG PO CHEW
1000.0000 mg | CHEWABLE_TABLET | Freq: Three times a day (TID) | ORAL | Status: DC
  Filled 2024-06-09 (×6): qty 2

## 2024-06-09 MED ORDER — LIDOCAINE HCL (PF) 1 % IJ SOLN: 5.0000 mL | INTRAMUSCULAR | Status: DC | PRN

## 2024-06-09 MED ORDER — HEPARIN SODIUM (PORCINE) 1000 UNIT/ML DIALYSIS: 1000.0000 [IU] | INTRAMUSCULAR | Status: DC | PRN

## 2024-06-09 MED ORDER — ROSUVASTATIN CALCIUM 5 MG PO TABS
10.0000 mg | ORAL_TABLET | Freq: Every day | ORAL | Status: DC
  Administered 2024-06-09 (×2): 10 mg via ORAL
  Filled 2024-06-09 (×2): qty 2

## 2024-06-09 MED ORDER — HEPARIN SODIUM (PORCINE) 5000 UNIT/ML IJ SOLN
5000.0000 [IU] | Freq: Three times a day (TID) | INTRAMUSCULAR | Status: DC
  Administered 2024-06-09 (×3): 5000 [IU] via SUBCUTANEOUS
  Filled 2024-06-09 (×4): qty 1

## 2024-06-09 MED ORDER — ACETAMINOPHEN 325 MG PO TABS
650.0000 mg | ORAL_TABLET | Freq: Four times a day (QID) | ORAL | Status: DC | PRN
Start: 2024-06-09 — End: 2024-06-10

## 2024-06-09 MED ORDER — PROCHLORPERAZINE EDISYLATE 10 MG/2ML IJ SOLN
10.0000 mg | Freq: Four times a day (QID) | INTRAMUSCULAR | Status: DC | PRN
  Administered 2024-06-09: 10 mg via INTRAVENOUS
  Filled 2024-06-09: qty 2

## 2024-06-09 MED ORDER — DEXTROSE 10 % IV SOLN: INTRAVENOUS | Status: DC

## 2024-06-09 NOTE — TOC Progression Note (Signed)
 Transition of Care [facility]) - Progression Note    Patient Details  Name: [name] MRN: [id number] Date of Birth: [birth year]  [facility] CM/SW Contact  Roxie KANDICE Stain, RN Phone Number: 06/09/2024, 2:23 PM  Clinical Narrative:    Attempted to do [facility] letter but patient is off the floor.                     Expected Discharge Plan and Services                                               Social Drivers of Health (SDOH) Interventions SDOH Screenings   Food Insecurity: No Food Insecurity (06/09/2024)  Housing: Low Risk  (06/09/2024)  Transportation Needs: No Transportation Needs (06/09/2024)  Utilities: Not At Risk (06/09/2024)  Depression (PHQ2-9): Low Risk  (03/28/2020)  Tobacco Use: Low Risk  (06/08/2024)    Readmission Risk Interventions     No data to display

## 2024-06-09 NOTE — ED Notes (Signed)
 Pt will use her own insulin  pump. Pt advised to let nursing staff know when and how much insulin  she uses. Pt agrees

## 2024-06-09 NOTE — Progress Notes (Addendum)
 Received patient in bed to unit.  Alert and oriented.  Informed consent signed and in chart.   TX duration: 3 hours and 15 minutes  Patient tolerated well.  Transported back to the room  Alert, without acute distress.  Hand-off given to patient's nurse.   Access used: Right Upper arm graft Access issues: none  Total UF removed: 2.5L Meds: hydralazine    06/09/24 1732  Vitals  Temp 98.2 F (36.8 C)  Temp Source Oral  BP (!) 166/75  MAP (mmHg) 91  Pulse Rate 78  ECG Heart Rate 78  Resp 16  Oxygen  Therapy  SpO2 99 %  O2 Device Nasal Cannula  O2 Flow Rate (L/min) 2 L/min  During Treatment Monitoring  Duration of HD Treatment -hour(s) 3.25 hour(s)  HD Safety Checks Performed Yes  Intra-Hemodialysis Comments Tx completed  Post Treatment  Dialyzer Clearance Lightly streaked  Liters Processed 78  Fluid Removed (mL) 2500 mL  Tolerated HD Treatment Yes  AVG/AVF Arterial Site Held (minutes) 7 minutes  AVG/AVF Venous Site Held (minutes) 7 minutes  Fistula / Graft Right Upper arm Arteriovenous vein graft  No Placement Date or Time found.   Placed prior to admission: Yes  Orientation: Right  Access Location: Upper arm  Access Type: Arteriovenous vein graft  Site Condition No complications  Fistula / Graft Assessment Present;Thrill;Bruit  Status Deaccessed;Patent  Drainage Description None     Faith Brasil LPN [facility]

## 2024-06-09 NOTE — Consult Note (Signed)
 Renal Service Consult Note [facility] Lamar JONETTA Fret, MD  Patient: [name] Date: 06/09/2024 Requesting Physician: Dr. Arlice  Reason for Consult: ESRD pt w/ DM admitted for hypoglycemia HPI: The patient is a 48 y.o. year-old w/ PMH as below who presented to ED w/ hypoglycemia. Pt is being admitted for adjustment of insulin . We are asked to see for dialysis.    Pt seen in room. Is on TTS HD, no recent problems. Access is in the R arm.  No sob or leg swelling.    ROS - denies CP, no joint pain, no HA, no blurry vision, no rash, no diarrhea, no nausea/ vomiting   Past Medical History  Past Medical History:  Diagnosis Date   Anemia    Arthritis    Ascites 03/28/2020   Bacteremia 02/11/2020   Cerebral infarction due to unspecified mechanism 05/22/2014   CHF (congestive heart failure) (HCC)    Diabetes mellitus    Diabetic retinopathy (HCC)    Hx of laser Rx's   DM type 1 (diabetes mellitus, type 1) (HCC) 09/12/2014   Age of onset for DM type 1 was age 69.      Enlarged thyroid  gland    ESRD on hemodialysis (HCC)    ESRD due to DM type I, age of onset DM 1 was age 8.  Went on dialysis in March 2012.  Gets HD now at [facility] on a TTS schedule.     ESRD on hemodialysis (HCC) 09/12/2014   ESRD due to DM type 1.  Started HD in 2012 at [facility].  Gets HD there on a TTS schedule.     GERD (gastroesophageal reflux disease)    HCAP (healthcare-associated pneumonia) 09/11/2014   Hemorrhage from arteriovenous dialysis graft 10/20/2019   History of blood transfusion    Hyperlipidemia    Hypertension    Hypertensive emergency 05/22/2014   Peripheral vascular disease    Pneumonia    Past Surgical History  Past Surgical History:  Procedure Laterality Date   A/V FISTULAGRAM Right 10/31/2020   Procedure: A/V FISTULAGRAM;  Surgeon: Magda Debby SAILOR, MD;  [facility] IN[facility];  Service: Cardiovascular;  Laterality: Right;   A/V SHUNT INTERVENTION  Right 02/19/2024   Procedure: A/V SHUNT INTERVENTION;  Surgeon: Gretta Lonni PARAS, MD;  Location: [facility] PV LAB;  Service: Cardiovascular;  Laterality: Right;   ARTERIOVENOUS GRAFT PLACEMENT     ARTERY REPAIR Left 12/10/2012   Procedure: BRACHIAL ARTERY REPAIR;  Surgeon: Lonni GORMAN Blade, MD;  Location: [facility] OR;  Service: Vascular;  Laterality: Left;  Exploration Left Brachial Artery for AVF   AV FISTULA PLACEMENT Right 01/11/2018   Procedure: INSERTION OF ARTERIOVENOUS (AV) GORE-TEX GRAFT  UPPER ARM;  Surgeon: Blade Lonni GORMAN, MD;  Location: [facility] OR;  Service: Vascular;  Laterality: Right;   AVGG REMOVAL Left 08/12/2022   Procedure: LEFT ARM ARTERIOVENOUS GRAFT EXCISION WITH VEIN PATCH ANGIOPLASTY;  Surgeon: Sheree Penne Lonni, MD;  Location: [facility] OR;  Service: Vascular;  Laterality: Left;   BASCILIC VEIN TRANSPOSITION Right 09/28/2017   Procedure: FIRST STAGE BASILIC VEIN TRANSPOSITION;  Surgeon: Blade Lonni GORMAN, MD;  Location: [facility] OR;  Service: Vascular;  Laterality: Right;   CESAREAN SECTION     CHEST TUBE INSERTION Right 06/01/2020   Procedure: CHEST TUBE INSERTION;  Surgeon: Brenna Adine CROME, DO;  Location: [facility] ENDOS[facility];  Service: Pulmonary;  Laterality: Right;   CHEST TUBE INSERTION Right 07/04/2020   Procedure: INSERTION PLEURAL DRAINAGE CATHETER;  Surgeon: Kerrin Elspeth BROCKS, MD;  Location: [facility] OR;  Service: Thoracic;  Laterality: Right;   EYE SURGERY     LAzer   EYE SURGERY Left    IR THORACENTESIS ASP PLEURAL SPACE W/IMG GUIDE  05/31/2020   IR THORACENTESIS ASP PLEURAL SPACE W/IMG GUIDE  06/22/2020   REVISION OF ARTERIOVENOUS GORETEX GRAFT Left 08/01/2016   Procedure: REVISION OF ARTERIOVENOUS GORETEX GRAFT;  Surgeon: Lonni GORMAN Blade, MD;  Location: [facility] OR;  Service: Vascular;  Laterality: Left;   REVISION OF ARTERIOVENOUS GORETEX GRAFT Right 10/21/2019   Procedure: REVISION OF ARTERIOVENOUS GORETEX GRAFT ARM;  Surgeon: Oris Krystal FALCON, MD;  Location: [facility] OR;  Service:  Vascular;  Laterality: Right;   RIGHT HEART CATH N/A 08/01/2020   Procedure: RIGHT HEART CATH;  Surgeon: Cherrie Toribio SAUNDERS, MD;  [facility];  Service: Cardiovascular;  Laterality: N/A;   SHUNTOGRAM Left 11/30/2013   Procedure: RICH;  Surgeon: Gaile LELON New, MD;  Location: [facility] CATH LAB;  Service: Cardiovascular;  Laterality: Left;   TEE WITHOUT CARDIOVERSION N/A 02/13/2020   Procedure: TRANSESOPHAGEAL ECHOCARDIOGRAM (TEE);  Surgeon: Delford Maude BROCKS, MD;  Location: [facility] ENDOSCOPY;  Service: Cardiovascular;  Laterality: N/A;   TEE WITHOUT CARDIOVERSION N/A 08/15/2022   Procedure: TRANSESOPHAGEAL ECHOCARDIOGRAM (TEE);  Surgeon: Alvan Ronal BRAVO, MD;  Location: [facility] ENDOSCOPY;  Service: Cardiovascular;  Laterality: N/A;   VENOUS ANGIOPLASTY  02/19/2024   Procedure: VENOUS ANGIOPLASTY;  Surgeon: Gretta Lonni PARAS, MD;  Location: [facility];  Service: Cardiovascular;;  Basilic Intragraft/Venous Anastomosis   Family History  Family History  Problem Relation Age of Onset   Cancer Mother        breast   Hypertension Father    Social History  reports that she has never smoked. She has never used smokeless tobacco. She reports that she does not drink alcohol  and does not use drugs. Allergies  Allergies  Allergen Reactions   Pollen Extract Other (See Comments)    Watery eyes   Home medications Prior to Admission medications   Medication Sig Start Date End Date Taking? Authorizing Provider  amLODipine  (NORVASC ) 10 MG tablet Take 10 mg by mouth at bedtime.   Yes [provider]  labetalol  (NORMODYNE ) 300 MG tablet Take 300 mg by mouth at bedtime.   Yes [provider]  Lanthanum  Carbonate (FOSRENOL ) 1000 MG PACK Take 1,000 mg by mouth with breakfast, with lunch, and with evening meal.   Yes [provider]  NOVOLOG  100 UNIT/ML injection Inject 1-5 Units into the skin with breakfast, with lunch, and with evening meal. Per sliding scale 10/10/20  Yes  [provider]  Pediatric Multivit-Minerals-C (FLINTSTONES GUMMIES PO) Take 2 tablets by mouth in the morning.   Yes [provider]  rosuvastatin  (CRESTOR ) 10 MG tablet Take 10 mg by mouth at bedtime.   Yes [provider]  loratadine  (CLARITIN ) 10 MG tablet Take 10 mg by mouth daily as needed for allergies. Patient not taking: Reported on 06/08/2024    [provider]     Vitals:   06/09/24 1030 06/09/24 1043 06/09/24 1100 06/09/24 1154  BP: (!) 166/86  (!) 184/82   Pulse: 71  72   Resp: (!) 21  14   Temp:  97.8 F (36.6 C)    TempSrc:  Oral    SpO2: 100%  100%   Weight:    58.3 kg  Height:    5' 3 (1.6 m)   Exam Gen alert, no  distress Sclera anicteric, throat clear  No jvd or bruits Chest clear bilat to bases RRR no MRG Abd soft ntnd no mass or ascites +bs Ext no LE or UE edema, no other edema Neuro is alert, Ox 3 , nf    RUE HeRO graft+bruit   Home bp meds: Norvasc  10 mg daily Labetalol  300 mg at bedtime    OP HD: [facility] TTS 3.5h   B425    55.6kg   AVAG / HeRO  Heparin  none Last OP HD 11/18, post wt 57.7kg (+2kg)   Assessment/ Plan: Hypoglycemia: pt admitted for insulin  adjustments ESRD: on HD TTS. Has not missed HD. Plan HD today or tonight.  HTN: resume home meds as needed Volume: no gross vol excess on exam, on RA, lungs clear. UF to dry wt as tolerated.  Anemia of esrd: Hb 12, follow.     Myer Fret  MD [facility] 06/09/2024, 12:23 PM  Recent Labs  Lab 06/08/24 2010  HGB 12.4  ALBUMIN  4.3  CALCIUM  9.9  CREATININE 7.90*  K 4.5   Inpatient medications:  amLODipine   10 mg Oral QHS   heparin   5,000 Units Subcutaneous Q8H   insulin  pump   Subcutaneous Q4H   labetalol   300 mg Oral QHS   lanthanum  1,000 mg Oral TID with meals   rosuvastatin   10 mg Oral QHS    acetaminophen  **OR** acetaminophen , dextrose , hydrALAZINE , prochlorperazine

## 2024-06-09 NOTE — Inpatient Diabetes Management (Addendum)
 Inpatient Diabetes Program Recommendations  AACE/ADA: New Consensus Statement on Inpatient Glycemic Control   Target Ranges:  Prepandial:   less than 140 mg/dL      Peak postprandial:   less than 180 mg/dL (1-2 hours)      Critically ill patients:  140 - 180 mg/dL   Lab Results  Component Value Date   GLUCAP 127 (H) 06/09/2024   HGBA1C 6.4 (H) 08/13/2022    Review of Glycemic Control  Diabetes history: DM1  Outpatient Diabetes medications:  Dexcom G6 CGM  T-Slim Insulin  Pump   Pump settings:  MN 0.26; 3am 0.35; 9am 0.27; 7pm 0.22. Total 6.68 units/day Insulin :Carb MN 22; 3am 26; 9am 25; 7pm 27. Insulin  sensitivity MN 80; 9am 85; 7pm 80.  Target 120.  IOB 2 hours   Patient followed by Endocrinology Joshua, PA. Last seen 04/27/24.   Current orders for Inpatient glycemic control:  T Slim Insulin  Pump   Inpatient Diabetes Program Recommendations:   Diabetes Coordinator consulted due to insulin  pump and hypoglycemia.   Spoke with patient at beside. Patient states she has been a Type 1 diabetic since the age of 58. She is followed by Endocrinology and last saw [place], [place] on 04/27/24.   Noted patient has T-Slim Insulin  pump beside her in the bed; however it was disconnected. Patient states she connects and disconnects often at home when she sees her blood sugar is dropping. I made patient aware since she is a Type 1 diabetic she MUST receive Basal AND Bolus insulin . I asked patient if she would be willing to disconnect pump and have MD order basal and bolus insulin . Patient is not open to discontinuing pump and wishes to continue current regimen.  I educated patient on the importance of keeping pump connected and communicating with staff when she administers bolus or disconnects for any reason. Patient verbalized understanding.   Spoke with Dr Arlice - agreeable to continue insulin  pump at this time.  Insulin  Pump order set in place.    Will continue to follow.  Thanks,  Lavanda Search, RN, MSN, [facility]  Inpatient Diabetes Coordinator  Pager [phone] (8a-5p)

## 2024-06-09 NOTE — ED Notes (Signed)
 Pt advised this RN that she checked her CBG using her Dexcom and it was over 300 so she connected her insulin  pump and administered 2 units Insulin , discussed with pt the risks of double dosing if we are both administering meds, education provided, pt verbalized understanding, disconnected insulin  pump and states she will not administer additional meds to herself

## 2024-06-09 NOTE — Plan of Care (Signed)

## 2024-06-09 NOTE — ED Notes (Signed)
 CCMD called, pt added to monitor.

## 2024-06-09 NOTE — Inpatient Diabetes Management (Addendum)
 Inpatient Diabetes Program Recommendations  AACE/ADA: New Consensus Statement on Inpatient Glycemic Control (2015)  Target Ranges:  Prepandial:   less than 140 mg/dL      Peak postprandial:   less than 180 mg/dL (1-2 hours)      Critically ill patients:  140 - 180 mg/dL   Lab Results  Component Value Date   GLUCAP 191 (H) 06/09/2024   HGBA1C 6.4 (H) 08/13/2022    Review of Glycemic Control  Inpatient Diabetes Program Recommendations:   Received page from bedside RN stating patient's pump went dead and now needs charger.  Spoke with patient at bedside. Since pump went dead, she will need new cartridge and site. Patient states her daughter is on her way with additional pump supplies and insulin . Patient going to HD this afternoon. Patient states she typically disconnects her pump when she going to HD anyway. I made patient and bedside nurse aware that patient should re-connect pump as soon as pump supplies are delivered by her daughter. Patient and bedside RN verbalized understanding.   Addendum @ 1500: Confirmed with Wyline Core, RN patient's daughter delivered pump supplies and supplies were given to patient in HD.   Thanks,  Lavanda Search, RN, MSN, [facility]  Inpatient Diabetes Coordinator  Pager [phone] (8a-5p)

## 2024-06-09 NOTE — TOC CM/SW Note (Signed)
 Transition of Care [facility]) - Inpatient Brief Assessment   Patient Details  Name: [name] MRN: [id number] Date of Birth: [birth year]  Transition of Care [facility]) CM/SW Contact:    Lauraine FORBES Saa, LCSWA Phone Number: 06/09/2024, 12:16 PM   Clinical Narrative:  12:16 PM Per chart review, patient resides at home with child(ren). Patient has a PCP and insurance. Patient does not have SNF history. Patient has HH history with [facility] and [facility]. Patient has DME (oxygen , RW, shower stool) history with [employer]. Patient's preferred pharmacy is [facility]. No TOC needs identified at this time. TOC will continue to follow.  Transition of Care Asessment: Insurance and Status: Insurance coverage has been reviewed Patient has primary care physician: Yes Home environment has been reviewed: Private Residence Prior level of function:: N/A Prior/Current Home Services: No current home services Social Drivers of Health Review: SDOH reviewed no interventions necessary Readmission risk has been reviewed: Yes (Currently Observation Status) Transition of care needs: no transition of care needs at this time

## 2024-06-09 NOTE — Progress Notes (Signed)
 PROGRESS NOTE  [name]  DOB: 05/04/1976  PCP: Catalina Bare, MD FMW:[id number]  DOA: 06/08/2024  LOS: 0 days  Hospital Day: 2  Subjective: Patient was seen and examined this morning. Pleasant young African-American female.  Lying down in bed. Alert, awake, slow to respond but oriented x 3. On dextrose  drip currently. Afebrile, blood pressure mostly 160s to 180s.  Brief narrative: [name] is a 48 y.o. female with PMH significant for ESRD-HD-TTS, DM1 on insulin  pump, HTN, HLD, CHF, stroke, PAD, diabetic retinopathy, neuropathy, GERD, anemia, arthritis 11/19, patient was brought to the ED by EMS with low blood sugar and syncopal episode. Reportedly patient has been on insulin  pump for about 5 years.  Recently seen by her endocrinologist in the past few weeks, changed settings on insulin  pump.  Recently she has been having relatively low blood sugar levels.  On the evening of presentation, patient was just about ready for dinner when she felt weak and passed out.  EMS noted blood sugar level low at 31, given IV and oral dextrose  and brought to the ED. insulin  pump was removed prior to presentation  In the ED, afebrile, hypertensive to 180s, Labs with CBC unremarkable, BMP with creatinine 7.9, glucose low at 39 Chest x-ray showed small to moderate right pleural effusion with superimposed right lower lobe atelectasis and/or airspace opacity.  CT head unremarkable for acute abnormality Started on dextrose  drip Admitted to [facility] Nephrology consulted  Assessment and plan: Acute symptomatic hypoglycemia Type 1 DM on insulin  pump A1c 5.4 on 04/27/2024 PTA meds-insulin  pump.  Rate was recently adjusted by her endocrinologist as an outpatient. Because of persistent low blood sugar in the ED, dextrose  drip was started.  Blood sugar trend noted as below.  Stop D10 drip Diabetes care coordinator consulted Planned for reinitiation of insulin  pump.  Diet ordered but patient  unwilling to eat because of nausea. Recent Labs  Lab 06/08/24 2121 06/08/24 2246 06/09/24 0656 06/09/24 0823 06/09/24 1012  GLUCAP 85 158* 176* 127* 135*   Syncope Due to severe hypoglycemia.   CT head showing no acute intracranial abnormality.    ESRD-HD-TTS Nephrology consulted   Chronic HFpEF Echo done in January 2024 showing EF 55 to 60%, grade 2 diastolic dysfunction, moderately elevated pulmonary artery systolic pressure, and moderate tricuspid regurgitation.  Volume management with dialysis as above. SBP currently 180-190s.   PTA meds- labetalol  300 mg nightly, amlodipine  10 mg nightly Patient missed taking her home antihypertensives on the day of presentation Amlodipine  and labetalol  have been resumed  Continue IV hydralazine  PRN SBP >160.  Elevated troponin/demand ischemia No anginal symptoms.   EKG without acute ischemic changes.   Troponin mildly elevated in the setting of ESRD but stable, not consistent with ACS.  Recent Labs    06/08/24 2010 06/08/24 2249  TROPONINIHS 26* 27*   Small to moderate right pleural effusion Chest x-ray showed small to moderate right pleural effusion with superimposed right lower lobe atelectasis and/or airspace opacity. Likely fluid buildup in the setting of ESRD on HD WBC count normal Pending resp virus panel and procalcitonin Recent Labs  Lab 06/08/24 2010  WBC 5.6   QT prolongation Initial EKG with QTc significantly prolonged to 624 ms no evidence of hypokalemia or hypomagnesemia on labs.   Continue cardiac monitoring and avoid QT prolonging drugs.     H/o CVA  PAD, HLD  Continue Crestor  10 mg nightly   Mobility: Able to ambulate independently  PT Orders:   PT  Follow up Rec:     Goals of care   Code Status: Full Code     DVT prophylaxis:  heparin  injection 5,000 Units Start: 06/09/24 0600   Antimicrobials: None Fluid: Stop D10 drip Consultants: Nephrology Family Communication: None at  bedside  Status: Observation Level of care:  Progressive   Patient is from: Home Needs to continue in-hospital care: Needs to monitor blood sugar trend on insulin  pump Anticipated d/c to: Pending clinical course.  Hopefully home in 1 to 2 days    Diet:  Diet Order             Diet renal with fluid restriction Fluid restriction: 1200 mL Fluid; Room service appropriate? Yes; Fluid consistency: Thin  Diet effective now                   Scheduled Meds:  amLODipine   10 mg Oral QHS   heparin   5,000 Units Subcutaneous Q8H   insulin  pump   Subcutaneous Q4H   labetalol   300 mg Oral QHS   lanthanum  1,000 mg Oral TID with meals   rosuvastatin   10 mg Oral QHS    PRN meds: acetaminophen  **OR** acetaminophen , dextrose , hydrALAZINE , prochlorperazine   Infusions:     Antimicrobials: Anti-infectives (From admission, onward)    None       Objective: Vitals:   06/09/24 1043 06/09/24 1100  BP:  (!) 184/82  Pulse:  72  Resp:  14  Temp: 97.8 F (36.6 C)   SpO2:  100%    Intake/Output Summary (Last 24 hours) at 06/09/2024 1139 Last data filed at 06/09/2024 0113 Gross per 24 hour  Intake 284.72 ml  Output --  Net 284.72 ml   Filed Weights   06/08/24 1954  Weight: 54.9 kg   Weight change:  Body mass index is 21.43 kg/m.   Physical Exam: General exam: Pleasant, young African American female.  Not in pain Skin: No rashes, lesions or ulcers. HEENT: Atraumatic, normocephalic, no obvious bleeding Lungs: Clear to auscultation bilaterally,  CVS: S1, S2, no murmur,   GI/Abd: Soft, nontender, nondistended, bowel sound present,   CNS: Alert, opens eyes on command.  Able to follow commands but slow to respond but oriented Psychiatry: Sad affect Extremities: No pedal edema, no calf tenderness,   Data Review: I have personally reviewed the laboratory data and studies available.  F/u labs ordered Unresulted Labs (From admission, onward)     Start     Ordered    06/09/24 0843  Procalcitonin  Add-on,   AD        06/09/24 9157            Signed, Chapman Rota, MD [facility] 06/09/2024

## 2024-06-09 NOTE — ED Notes (Signed)
 Pt c/o nausea and dry heaving. MD Dahal messaged.

## 2024-06-09 NOTE — ED Notes (Signed)
 NP at bedside, pt also provided with breakfast

## 2024-06-09 NOTE — Progress Notes (Addendum)
 Pt receives out-pt HD, TTS at [facility] [place] [facility] 0700am chair time. Will assist as needed   Lavanda Fredrickson Dialysis Navigator [phone]

## 2024-06-10 DIAGNOSIS — E162 Hypoglycemia, unspecified: Secondary | ICD-10-CM | POA: Diagnosis not present

## 2024-06-10 LAB — GLUCOSE, CAPILLARY
Glucose-Capillary: 113 mg/dL — ABNORMAL HIGH (ref 70–99)
Glucose-Capillary: 153 mg/dL — ABNORMAL HIGH (ref 70–99)

## 2024-06-10 LAB — HEPATITIS B SURFACE ANTIBODY, QUANTITATIVE: Hep B S AB Quant (Post): 70.4 m[IU]/mL

## 2024-06-10 MED ORDER — INSULIN PUMP: SUBCUTANEOUS | Status: AC

## 2024-06-10 NOTE — Progress Notes (Signed)
 D/c orders noted. Contacted pt out-pt hd clinic, [facility] [address] [place] on TTS at 7:00am, to inform of pt d/c and expected arrival back at clinic 06/10/2024. D/C summary has been faxed over. No further assistance needed at this time.    Suzen Satchel Dialysis Navigator [phone]

## 2024-06-10 NOTE — Discharge Summary (Signed)
 Physician Discharge Summary  [name] FMW:[id number] DOB: 01-Oct-1975 DOA: 06/08/2024  PCP: Catalina Bare, MD  Admit date: 06/08/2024 Discharge date: 06/10/2024  Admitted from: Home Discharge disposition: Home  Recommendations at discharge:  Follow-up with her regular endocrinologist to plan for adjustment of insulin  dose to avoid further recurrence of hypoglycemia   Subjective: Patient was seen and examined this morning. Lying on bed.  Not in distress.  Feels good.  No nausea.  Able to tolerate diet.  Mental status improved. Afebrile, heart rate in 70s and 80s, blood pressure 150s to 180s in the last 24 hours, breathing room air Blood sugar trending last 24 hours so is mostly stable less than 200.  Brief narrative: GWENEVERE Werner is a 48 y.o. female with PMH significant for ESRD-HD-TTS, DM1 on insulin  pump, HTN, HLD, CHF, stroke, PAD, diabetic retinopathy, neuropathy, GERD, anemia, arthritis 11/19, patient was brought to the ED by EMS with low blood sugar and syncopal episode. Reportedly patient has been on insulin  pump for about 5 years.  Recently seen by her endocrinologist in the past few weeks, changed settings on insulin  pump.  Recently she has been having relatively low blood sugar levels.  On the evening of presentation, patient was just about ready for dinner when she felt weak and passed out.  EMS noted blood sugar level low at 31, given IV and oral dextrose  and brought to the ED. insulin  pump was removed prior to presentation  In the ED, afebrile, hypertensive to 180s, Labs with CBC unremarkable, BMP with creatinine 7.9, glucose low at 39 Chest x-ray showed small to moderate right pleural effusion with superimposed right lower lobe atelectasis and/or airspace opacity.  CT head unremarkable for acute abnormality Started on dextrose  drip Admitted to [facility] Nephrology consulted  Hospital course: Acute symptomatic hypoglycemia Type 1 DM on insulin  pump A1c  5.4 on 04/27/2024 PTA meds-insulin  pump.  Rate was recently adjusted by her endocrinologist as an outpatient. Because of persistent low blood sugar in the ED, dextrose  drip was started.   In the last 24 hours, D10 drip was stopped.  Insulin  pump has been restarted. Had some nausea but able to tolerate oral intake Blood sugar trend as below, more or less remains controlled  Diabetes care coordinator consult appreciated. Patient needs to follow-up with her regular endocrinologist to plan for adjustment of insulin  dose to avoid further recurrence of hypoglycemia Recent Labs  Lab 06/09/24 1306 06/09/24 1903 06/09/24 2312 06/10/24 0448 06/10/24 1111  GLUCAP 191* 153* 108* 113* 153*   Syncope Due to severe hypoglycemia.   CT head did not show any acute intracranial abnormality.   Mental status much better, back to normal  ESRD-HD-TTS Nephrology consulted   Chronic HFpEF Echo done in January 2024 showing EF 55 to 60%, grade 2 diastolic dysfunction, moderately elevated pulmonary artery systolic pressure, and moderate tricuspid regurgitation.  Volume management with dialysis as above. SBP currently 180-190s.   PTA meds- labetalol  300 mg nightly, amlodipine  10 mg nightly Continue both as before  Elevated troponin/demand ischemia No anginal symptoms.   EKG without acute ischemic changes.   Troponin mildly elevated in the setting of ESRD but stable, not consistent with ACS.  Recent Labs    06/08/24 2010 06/08/24 2249  TROPONINIHS 26* 27*   Small to moderate right pleural effusion Chest x-ray showed small to moderate right pleural effusion with superimposed right lower lobe atelectasis and/or airspace opacity. Likely fluid buildup in the setting of ESRD on HD WBC count  normal Respiratory virus panel negative.   Recent Labs  Lab 06/08/24 2010 06/09/24 1350  WBC 5.6 4.9  PROCALCITON  --  1.23   QT prolongation Initial EKG with QTc significantly prolonged to 624 ms no evidence  of hypokalemia or hypomagnesemia on labs.   Continue to avoid QT prolonging drugs.     H/o CVA  PAD, HLD  Continue Crestor  10 mg nightly  Mobility: Able to ambulate independently  Goals of care   Code Status: Full Code   Diet:  Diet Order             Diet Carb Modified           Diet renal with fluid restriction Fluid restriction: 1200 mL Fluid; Room service appropriate? Yes; Fluid consistency: Thin  Diet effective now                   Nutritional status:  Body mass index is 22.06 kg/m.       Wounds:  -   Discharge Medications:   Allergies as of 06/10/2024       Reactions   Pollen Extract Other (See Comments)   Watery eyes        Medication List     TAKE these medications    amLODipine  10 MG tablet Commonly known as: NORVASC  Take 10 mg by mouth at bedtime.   FLINTSTONES GUMMIES PO Take 2 tablets by mouth in the morning.   Fosrenol  1000 MG Pack Generic drug: Lanthanum  Carbonate Take 1,000 mg by mouth with breakfast, with lunch, and with evening meal.   insulin  pump Soln Dose to be adjusted in consultation with primary endocrinologist   labetalol  300 MG tablet Commonly known as: NORMODYNE  Take 300 mg by mouth at bedtime.   loratadine  10 MG tablet Commonly known as: CLARITIN  Take 10 mg by mouth daily as needed for allergies.   NovoLOG  100 UNIT/ML injection Generic drug: insulin  aspart Inject 1-5 Units into the skin with breakfast, with lunch, and with evening meal. Per sliding scale   rosuvastatin  10 MG tablet Commonly known as: CRESTOR  Take 10 mg by mouth at bedtime.         Follow ups:    Follow-up Information     Catalina Bare, MD Follow up.   Specialty: Internal Medicine Contact information: [address] [place] [address] [phone]                 Discharge Instructions:   Discharge Instructions     Call MD for:  difficulty breathing, headache or visual disturbances   Complete by: As  directed    Call MD for:  extreme fatigue   Complete by: As directed    Call MD for:  hives   Complete by: As directed    Call MD for:  persistant dizziness or light-headedness   Complete by: As directed    Call MD for:  persistant nausea and vomiting   Complete by: As directed    Call MD for:  severe uncontrolled pain   Complete by: As directed    Call MD for:  temperature >100.4   Complete by: As directed    Diet Carb Modified   Complete by: As directed    Discharge instructions   Complete by: As directed    Recommendations at discharge:   Follow-up with her regular endocrinologist to plan for adjustment of insulin  dose to avoid further recurrence of hypoglycemia   Discharge instructions for diabetes mellitus: Check  blood sugar 3 times a day and bedtime at home. If blood sugar running above 200 or less than 70 please call your MD to adjust insulin . If you notice signs and symptoms of hypoglycemia (low blood sugar) like jitteriness, confusion, thirst, tremor and sweating, please check blood sugar, drink sugary drink/biscuits/sweets to increase sugar level and call MD or return to ER.      General discharge instructions: Follow with Primary MD Catalina Bare, MD in 7 days  Please request your PCP  to go over your hospital tests, procedures, radiology results at the follow up. Please get your medicines reviewed and adjusted.  Your PCP may decide to repeat certain labs or tests as needed. Do not drive, operate heavy machinery, perform activities at heights, swimming or participation in water activities or provide baby sitting services if your were admitted for syncope or siezures until you have seen by Primary MD or a Neurologist and advised to do so again. [place]  Controlled Substance Reporting System database was reviewed. Do not drive, operate heavy machinery, perform activities at heights, swim, participate in water activities or provide baby-sitting services while on  medications for pain, sleep and mood until your outpatient physician has reevaluated you and advised to do so again.  You are strongly recommended to comply with the dose, frequency and duration of prescribed medications. Activity: As tolerated with Full fall precautions use walker/cane & assistance as needed Avoid using any recreational substances like cigarette, tobacco, alcohol , or non-prescribed drug. If you experience worsening of your admission symptoms, develop shortness of breath, life threatening emergency, suicidal or homicidal thoughts you must seek medical attention immediately by calling 911 or calling your MD immediately  if symptoms less severe. You must read complete instructions/literature along with all the possible adverse reactions/side effects for all the medicines you take and that have been prescribed to you. Take any new medicine only after you have completely understood and accepted all the possible adverse reactions/side effects.  Wear Seat belts while driving. You were cared for by a hospitalist during your hospital stay. If you have any questions about your discharge medications or the care you received while you were in the hospital after you are discharged, you can call the unit and ask to speak with the hospitalist or the covering physician. Once you are discharged, your primary care physician will handle any further medical issues. Please note that NO REFILLS for any discharge medications will be authorized once you are discharged, as it is imperative that you return to your primary care physician (or establish a relationship with a primary care physician if you do not have one).   Increase activity slowly   Complete by: As directed        Discharge Exam:   Vitals:   06/09/24 2334 06/10/24 0447 06/10/24 0739 06/10/24 1114  BP: (!) 164/83 (!) 155/79 (!) 160/86 (!) 168/90  Pulse: 78 75 72 82  Resp:  19 16 18   Temp:  98.3 F (36.8 C) 98.5 F (36.9 C) 98.3 F (36.8  C)  TempSrc:  Oral Oral Oral  SpO2: 96% 91% 98% 94%  Weight:      Height:        Body mass index is 22.06 kg/m.  General exam: Pleasant, young African American female.  Not in pain Skin: No rashes, lesions or ulcers. HEENT: Atraumatic, normocephalic, no obvious bleeding Lungs: Clear to auscultation bilaterally,  CVS: S1, S2, no murmur,   GI/Abd: Soft, nontender, nondistended, bowel sound present,  CNS: Alert,, awake, oriented x 3 Psychiatry: Mood appropriate Extremities: No pedal edema, no calf tenderness,   Data Review: I have personally reviewed the laboratory data and studies available.   The results of significant diagnostics from this hospitalization (including imaging, microbiology, ancillary and laboratory) are listed below for reference.    Procedures and Diagnostic Studies:   CT Head Wo Contrast Result Date: 06/08/2024 EXAM: CT HEAD WITHOUT CONTRAST 06/08/2024 08:35:00 PM TECHNIQUE: CT of the head was performed without the administration of intravenous contrast. Automated exposure control, iterative reconstruction, and/or weight based adjustment of the mA/kV was utilized to reduce the radiation dose to as low as reasonably achievable. COMPARISON: CT head 12/25/2014 CLINICAL HISTORY: Head trauma, moderate-severe FINDINGS: BRAIN AND VENTRICLES: No acute hemorrhage. No evidence of acute infarct. No hydrocephalus. No extra-axial collection. No mass effect or midline shift. ORBITS: No acute abnormality. SINUSES: No acute abnormality. SOFT TISSUES AND SKULL: No acute soft tissue abnormality. No skull fracture. IMPRESSION: 1. No acute intracranial abnormality. Electronically signed by: Gilmore Molt MD 06/08/2024 08:51 PM EST RP Workstation: [id number]   DG Chest Portable 1 View Result Date: 06/08/2024 EXAM: 1 VIEW(S) XRAY OF THE CHEST 06/08/2024 08:18:00 PM COMPARISON: CXR 08/06/2022. ct chest 05/31/20 CLINICAL HISTORY: syncope FINDINGS: LUNGS AND PLEURA: Stable versus  recurrent small-to-moderate right pleural effusion. Superimposed right lower lobe atelectasis and/or airspace opacity. Diffuse mild interstitial prominence. No pneumothorax. HEART AND MEDIASTINUM: Unchanged cardiomediastinal silhouette. Mild cardiomegaly. Aortic arch calcifications noted. Prominent central pulmonary vessels. BONES AND SOFT TISSUES: Left axillary surgical clips noted. No acute osseous abnormality. IMPRESSION: 1. Small-to-moderate right pleural effusion with superimposed right lower lobe atelectasis and/or airspace opacity.Follow-up PA and lateral chest X-ray is recommended in 3-4 weeks following therapy to ensure resolution and exclude underlying malignancy. Electronically signed by: Kate Plummer MD 06/08/2024 08:41 PM EST RP Workstation: [id number]     Labs:   Basic Metabolic Panel: Recent Labs  Lab 06/08/24 2010 06/09/24 1350  NA 141 134*  K 4.5 5.0  CL 95* 90*  CO2 27 25  GLUCOSE 39* 252*  BUN 28* 34*  CREATININE 7.90* 9.11*  CALCIUM  9.9 9.3  MG 2.5*  --   PHOS  --  8.0*   GFR Estimated Creatinine Clearance: 6.4 mL/min (A) (by C-G formula based on SCr of 9.11 mg/dL (H)). Liver Function Tests: Recent Labs  Lab 06/08/24 2010 06/09/24 1350  AST 29  --   ALT 18  --   ALKPHOS 80  --   BILITOT 0.8  --   PROT 8.4*  --   ALBUMIN  4.3 3.8   No results for input(s): LIPASE, AMYLASE in the last 168 hours. No results for input(s): AMMONIA in the last 168 hours. Coagulation profile No results for input(s): INR, PROTIME in the last 168 hours.  CBC: Recent Labs  Lab 06/08/24 2010 06/09/24 1350  WBC 5.6 4.9  NEUTROABS 3.8  --   HGB 12.4 10.3*  HCT 38.4 31.6*  MCV 89.7 88.3  PLT 153 151   Cardiac Enzymes: No results for input(s): CKTOTAL, CKMB, CKMBINDEX, TROPONINI in the last 168 hours. BNP: Invalid input(s): POCBNP CBG: Recent Labs  Lab 06/09/24 1306 06/09/24 1903 06/09/24 2312 06/10/24 0448 06/10/24 1111  GLUCAP 191* 153* 108*  113* 153*   D-Dimer No results for input(s): DDIMER in the last 72 hours. Hgb A1c No results for input(s): HGBA1C in the last 72 hours. Lipid Profile No results for input(s): CHOL, HDL, LDLCALC, TRIG, CHOLHDL, LDLDIRECT in the last 72 hours. Thyroid   function studies No results for input(s): TSH, T4TOTAL, T3FREE, THYROIDAB in the last 72 hours.  Invalid input(s): FREET3 Anemia work up No results for input(s): VITAMINB12, FOLATE, FERRITIN, TIBC, IRON, RETICCTPCT in the last 72 hours. Microbiology Recent Results (from the past 240 hours)  Resp panel by RT-PCR (RSV, Flu A&B, Covid) Anterior Nasal Swab     Status: None   Collection Time: 06/09/24  7:02 AM   Specimen: Anterior Nasal Swab  Result Value Ref Range Status   SARS Coronavirus 2 by RT PCR NEGATIVE NEGATIVE Final   Influenza A by PCR NEGATIVE NEGATIVE Final   Influenza B by PCR NEGATIVE NEGATIVE Final    Comment: (NOTE) The Xpert Xpress SARS-CoV-2/FLU/RSV plus assay is intended as an aid in the diagnosis of influenza from Nasopharyngeal swab specimens and should not be used as a sole basis for treatment. Nasal washings and aspirates are unacceptable for Xpert Xpress SARS-CoV-2/FLU/RSV testing.  Fact Sheet for Patients: bloggercourse.com  Fact Sheet for Healthcare Providers: seriousbroker.it  This test is not yet approved or cleared by the [place]  FDA and has been authorized for detection and/or diagnosis of SARS-CoV-2 by FDA under an Emergency Use Authorization (EUA). This EUA will remain in effect (meaning this test can be used) for the duration of the COVID-19 declaration under Section 564(b)(1) of the Act, 21 U.S.C. section 360bbb-3(b)(1), unless the authorization is terminated or revoked.     Resp Syncytial Virus by PCR NEGATIVE NEGATIVE Final    Comment: (NOTE) Fact Sheet for  Patients: bloggercourse.com  Fact Sheet for Healthcare Providers: seriousbroker.it  This test is not yet approved or cleared by the [place]  FDA and has been authorized for detection and/or diagnosis of SARS-CoV-2 by FDA under an Emergency Use Authorization (EUA). This EUA will remain in effect (meaning this test can be used) for the duration of the COVID-19 declaration under Section 564(b)(1) of the Act, 21 U.S.C. section 360bbb-3(b)(1), unless the authorization is terminated or revoked.  Performed at [facility], [address] [place], [place] [address]   MRSA Next Gen by PCR, Nasal     Status: None   Collection Time: 06/09/24 12:05 PM   Specimen: Nasal Mucosa; Nasal Swab  Result Value Ref Range Status   MRSA by PCR Next Gen NOT DETECTED NOT DETECTED Final    Comment: (NOTE) The GeneXpert MRSA Assay (FDA approved for NASAL specimens only), is one component of a comprehensive MRSA colonization surveillance program. It is not intended to diagnose MRSA infection nor to guide or monitor treatment for MRSA infections. Test performance is not FDA approved in patients less than 37 years old. Performed at [facility], [address] [place], [place] [address]     Time coordinating discharge: 45 minutes  Signed: Bailey Kolbe  [facility] 06/10/2024, 2:06 PM

## 2024-06-10 NOTE — Progress Notes (Signed)
 Physician Discharge Summary  [name] FMW:[id number] DOB: 26-Aug-1975 DOA: 06/08/2024  PCP: Catalina Bare, MD  Admit date: 06/08/2024 Discharge date: 06/10/2024  Admitted from: Home Discharge disposition: Home  Recommendations at discharge:  Follow-up with her regular endocrinologist to plan for adjustment of insulin  dose to avoid further recurrence of hypoglycemia   Subjective: Patient was seen and examined this morning. Lying on bed.  Not in distress.  Feels good.  No nausea.  Able to tolerate diet.  Mental status improved. Afebrile, heart rate in 70s and 80s, blood pressure 150s to 180s in the last 24 hours, breathing room air Blood sugar trending last 24 hours so is mostly stable less than 200.  Brief narrative: [name] is a 48 y.o. female with PMH significant for ESRD-HD-TTS, DM1 on insulin  pump, HTN, HLD, CHF, stroke, PAD, diabetic retinopathy, neuropathy, GERD, anemia, arthritis 11/19, patient was brought to the ED by EMS with low blood sugar and syncopal episode. Reportedly patient has been on insulin  pump for about 5 years.  Recently seen by her endocrinologist in the past few weeks, changed settings on insulin  pump.  Recently she has been having relatively low blood sugar levels.  On the evening of presentation, patient was just about ready for dinner when she felt weak and passed out.  EMS noted blood sugar level low at 31, given IV and oral dextrose  and brought to the ED. insulin  pump was removed prior to presentation  In the ED, afebrile, hypertensive to 180s, Labs with CBC unremarkable, BMP with creatinine 7.9, glucose low at 39 Chest x-ray showed small to moderate right pleural effusion with superimposed right lower lobe atelectasis and/or airspace opacity.  CT head unremarkable for acute abnormality Started on dextrose  drip Admitted to [facility] Nephrology consulted  Hospital course: Acute symptomatic hypoglycemia Type 1 DM on insulin  pump A1c  5.4 on 04/27/2024 PTA meds-insulin  pump.  Rate was recently adjusted by her endocrinologist as an outpatient. Because of persistent low blood sugar in the ED, dextrose  drip was started.   In the last 24 hours, D10 drip was stopped.  Insulin  pump has been restarted. Had some nausea but able to tolerate oral intake Blood sugar trend as below, more or less remains controlled  Diabetes care coordinator consult appreciated. Patient needs to follow-up with her regular endocrinologist to plan for adjustment of insulin  dose to avoid further recurrence of hypoglycemia Recent Labs  Lab 06/09/24 1306 06/09/24 1903 06/09/24 2312 06/10/24 0448 06/10/24 1111  GLUCAP 191* 153* 108* 113* 153*   Syncope Due to severe hypoglycemia.   CT head did not show any acute intracranial abnormality.   Mental status much better, back to normal  ESRD-HD-TTS Nephrology consulted   Chronic HFpEF Echo done in January 2024 showing EF 55 to 60%, grade 2 diastolic dysfunction, moderately elevated pulmonary artery systolic pressure, and moderate tricuspid regurgitation.  Volume management with dialysis as above. SBP currently 180-190s.   PTA meds- labetalol  300 mg nightly, amlodipine  10 mg nightly Continue both as before  Elevated troponin/demand ischemia No anginal symptoms.   EKG without acute ischemic changes.   Troponin mildly elevated in the setting of ESRD but stable, not consistent with ACS.  Recent Labs    06/08/24 2010 06/08/24 2249  TROPONINIHS 26* 27*   Small to moderate right pleural effusion Chest x-ray showed small to moderate right pleural effusion with superimposed right lower lobe atelectasis and/or airspace opacity. Likely fluid buildup in the setting of ESRD on HD WBC count  normal Respiratory virus panel negative.   Recent Labs  Lab 06/08/24 2010 06/09/24 1350  WBC 5.6 4.9  PROCALCITON  --  1.23   QT prolongation Initial EKG with QTc significantly prolonged to 624 ms no evidence  of hypokalemia or hypomagnesemia on labs.   Continue to avoid QT prolonging drugs.     H/o CVA  PAD, HLD  Continue Crestor  10 mg nightly  Mobility: Able to ambulate independently  Goals of care   Code Status: Full Code   Diet:  Diet Order             Diet Carb Modified           Diet renal with fluid restriction Fluid restriction: 1200 mL Fluid; Room service appropriate? Yes; Fluid consistency: Thin  Diet effective now                   Nutritional status:  Body mass index is 22.06 kg/m.       Wounds:  -   Discharge Medications:   Allergies as of 06/10/2024       Reactions   Pollen Extract Other (See Comments)   Watery eyes        Medication List     TAKE these medications    amLODipine  10 MG tablet Commonly known as: NORVASC  Take 10 mg by mouth at bedtime.   FLINTSTONES GUMMIES PO Take 2 tablets by mouth in the morning.   Fosrenol  1000 MG Pack Generic drug: Lanthanum  Carbonate Take 1,000 mg by mouth with breakfast, with lunch, and with evening meal.   insulin  pump Soln Dose to be adjusted in consultation with primary endocrinologist   labetalol  300 MG tablet Commonly known as: NORMODYNE  Take 300 mg by mouth at bedtime.   loratadine  10 MG tablet Commonly known as: CLARITIN  Take 10 mg by mouth daily as needed for allergies.   NovoLOG  100 UNIT/ML injection Generic drug: insulin  aspart Inject 1-5 Units into the skin with breakfast, with lunch, and with evening meal. Per sliding scale   rosuvastatin  10 MG tablet Commonly known as: CRESTOR  Take 10 mg by mouth at bedtime.         Follow ups:    Follow-up Information     Catalina Bare, MD Follow up.   Specialty: Internal Medicine Contact information: [address] [place] [address] [phone]                 Discharge Instructions:   Discharge Instructions     Call MD for:  difficulty breathing, headache or visual disturbances   Complete by: As  directed    Call MD for:  extreme fatigue   Complete by: As directed    Call MD for:  hives   Complete by: As directed    Call MD for:  persistant dizziness or light-headedness   Complete by: As directed    Call MD for:  persistant nausea and vomiting   Complete by: As directed    Call MD for:  severe uncontrolled pain   Complete by: As directed    Call MD for:  temperature >100.4   Complete by: As directed    Diet Carb Modified   Complete by: As directed    Discharge instructions   Complete by: As directed    Recommendations at discharge:   Follow-up with her regular endocrinologist to plan for adjustment of insulin  dose to avoid further recurrence of hypoglycemia   Discharge instructions for diabetes mellitus: Check  blood sugar 3 times a day and bedtime at home. If blood sugar running above 200 or less than 70 please call your MD to adjust insulin . If you notice signs and symptoms of hypoglycemia (low blood sugar) like jitteriness, confusion, thirst, tremor and sweating, please check blood sugar, drink sugary drink/biscuits/sweets to increase sugar level and call MD or return to ER.      General discharge instructions: Follow with Primary MD Catalina Bare, MD in 7 days  Please request your PCP  to go over your hospital tests, procedures, radiology results at the follow up. Please get your medicines reviewed and adjusted.  Your PCP may decide to repeat certain labs or tests as needed. Do not drive, operate heavy machinery, perform activities at heights, swimming or participation in water activities or provide baby sitting services if your were admitted for syncope or siezures until you have seen by Primary MD or a Neurologist and advised to do so again. [place]  Controlled Substance Reporting System database was reviewed. Do not drive, operate heavy machinery, perform activities at heights, swim, participate in water activities or provide baby-sitting services while on  medications for pain, sleep and mood until your outpatient physician has reevaluated you and advised to do so again.  You are strongly recommended to comply with the dose, frequency and duration of prescribed medications. Activity: As tolerated with Full fall precautions use walker/cane & assistance as needed Avoid using any recreational substances like cigarette, tobacco, alcohol , or non-prescribed drug. If you experience worsening of your admission symptoms, develop shortness of breath, life threatening emergency, suicidal or homicidal thoughts you must seek medical attention immediately by calling 911 or calling your MD immediately  if symptoms less severe. You must read complete instructions/literature along with all the possible adverse reactions/side effects for all the medicines you take and that have been prescribed to you. Take any new medicine only after you have completely understood and accepted all the possible adverse reactions/side effects.  Wear Seat belts while driving. You were cared for by a hospitalist during your hospital stay. If you have any questions about your discharge medications or the care you received while you were in the hospital after you are discharged, you can call the unit and ask to speak with the hospitalist or the covering physician. Once you are discharged, your primary care physician will handle any further medical issues. Please note that NO REFILLS for any discharge medications will be authorized once you are discharged, as it is imperative that you return to your primary care physician (or establish a relationship with a primary care physician if you do not have one).   Increase activity slowly   Complete by: As directed        Discharge Exam:   Vitals:   06/09/24 2334 06/10/24 0447 06/10/24 0739 06/10/24 1114  BP: (!) 164/83 (!) 155/79 (!) 160/86 (!) 168/90  Pulse: 78 75 72 82  Resp:  19 16 18   Temp:  98.3 F (36.8 C) 98.5 F (36.9 C) 98.3 F (36.8  C)  TempSrc:  Oral Oral Oral  SpO2: 96% 91% 98% 94%  Weight:      Height:        Body mass index is 22.06 kg/m.  General exam: Pleasant, young African American female.  Not in pain Skin: No rashes, lesions or ulcers. HEENT: Atraumatic, normocephalic, no obvious bleeding Lungs: Clear to auscultation bilaterally,  CVS: S1, S2, no murmur,   GI/Abd: Soft, nontender, nondistended, bowel sound present,  CNS: Alert,, awake, oriented x 3 Psychiatry: Mood appropriate Extremities: No pedal edema, no calf tenderness,   Data Review: I have personally reviewed the laboratory data and studies available.   The results of significant diagnostics from this hospitalization (including imaging, microbiology, ancillary and laboratory) are listed below for reference.    Procedures and Diagnostic Studies:   CT Head Wo Contrast Result Date: 06/08/2024 EXAM: CT HEAD WITHOUT CONTRAST 06/08/2024 08:35:00 PM TECHNIQUE: CT of the head was performed without the administration of intravenous contrast. Automated exposure control, iterative reconstruction, and/or weight based adjustment of the mA/kV was utilized to reduce the radiation dose to as low as reasonably achievable. COMPARISON: CT head 12/25/2014 CLINICAL HISTORY: Head trauma, moderate-severe FINDINGS: BRAIN AND VENTRICLES: No acute hemorrhage. No evidence of acute infarct. No hydrocephalus. No extra-axial collection. No mass effect or midline shift. ORBITS: No acute abnormality. SINUSES: No acute abnormality. SOFT TISSUES AND SKULL: No acute soft tissue abnormality. No skull fracture. IMPRESSION: 1. No acute intracranial abnormality. Electronically signed by: Gilmore Molt MD 06/08/2024 08:51 PM EST RP Workstation: [id number]   DG Chest Portable 1 View Result Date: 06/08/2024 EXAM: 1 VIEW(S) XRAY OF THE CHEST 06/08/2024 08:18:00 PM COMPARISON: CXR 08/06/2022. ct chest 05/31/20 CLINICAL HISTORY: syncope FINDINGS: LUNGS AND PLEURA: Stable versus  recurrent small-to-moderate right pleural effusion. Superimposed right lower lobe atelectasis and/or airspace opacity. Diffuse mild interstitial prominence. No pneumothorax. HEART AND MEDIASTINUM: Unchanged cardiomediastinal silhouette. Mild cardiomegaly. Aortic arch calcifications noted. Prominent central pulmonary vessels. BONES AND SOFT TISSUES: Left axillary surgical clips noted. No acute osseous abnormality. IMPRESSION: 1. Small-to-moderate right pleural effusion with superimposed right lower lobe atelectasis and/or airspace opacity.Follow-up PA and lateral chest X-ray is recommended in 3-4 weeks following therapy to ensure resolution and exclude underlying malignancy. Electronically signed by: Kate Plummer MD 06/08/2024 08:41 PM EST RP Workstation: [id number]     Labs:   Basic Metabolic Panel: Recent Labs  Lab 06/08/24 2010 06/09/24 1350  NA 141 134*  K 4.5 5.0  CL 95* 90*  CO2 27 25  GLUCOSE 39* 252*  BUN 28* 34*  CREATININE 7.90* 9.11*  CALCIUM  9.9 9.3  MG 2.5*  --   PHOS  --  8.0*   GFR Estimated Creatinine Clearance: 6.4 mL/min (A) (by C-G formula based on SCr of 9.11 mg/dL (H)). Liver Function Tests: Recent Labs  Lab 06/08/24 2010 06/09/24 1350  AST 29  --   ALT 18  --   ALKPHOS 80  --   BILITOT 0.8  --   PROT 8.4*  --   ALBUMIN  4.3 3.8   No results for input(s): LIPASE, AMYLASE in the last 168 hours. No results for input(s): AMMONIA in the last 168 hours. Coagulation profile No results for input(s): INR, PROTIME in the last 168 hours.  CBC: Recent Labs  Lab 06/08/24 2010 06/09/24 1350  WBC 5.6 4.9  NEUTROABS 3.8  --   HGB 12.4 10.3*  HCT 38.4 31.6*  MCV 89.7 88.3  PLT 153 151   Cardiac Enzymes: No results for input(s): CKTOTAL, CKMB, CKMBINDEX, TROPONINI in the last 168 hours. BNP: Invalid input(s): POCBNP CBG: Recent Labs  Lab 06/09/24 1306 06/09/24 1903 06/09/24 2312 06/10/24 0448 06/10/24 1111  GLUCAP 191* 153* 108*  113* 153*   D-Dimer No results for input(s): DDIMER in the last 72 hours. Hgb A1c No results for input(s): HGBA1C in the last 72 hours. Lipid Profile No results for input(s): CHOL, HDL, LDLCALC, TRIG, CHOLHDL, LDLDIRECT in the last 72 hours. Thyroid   function studies No results for input(s): TSH, T4TOTAL, T3FREE, THYROIDAB in the last 72 hours.  Invalid input(s): FREET3 Anemia work up No results for input(s): VITAMINB12, FOLATE, FERRITIN, TIBC, IRON, RETICCTPCT in the last 72 hours. Microbiology Recent Results (from the past 240 hours)  Resp panel by RT-PCR (RSV, Flu A&B, Covid) Anterior Nasal Swab     Status: None   Collection Time: 06/09/24  7:02 AM   Specimen: Anterior Nasal Swab  Result Value Ref Range Status   SARS Coronavirus 2 by RT PCR NEGATIVE NEGATIVE Final   Influenza A by PCR NEGATIVE NEGATIVE Final   Influenza B by PCR NEGATIVE NEGATIVE Final    Comment: (NOTE) The Xpert Xpress SARS-CoV-2/FLU/RSV plus assay is intended as an aid in the diagnosis of influenza from Nasopharyngeal swab specimens and should not be used as a sole basis for treatment. Nasal washings and aspirates are unacceptable for Xpert Xpress SARS-CoV-2/FLU/RSV testing.  Fact Sheet for Patients: bloggercourse.com  Fact Sheet for Healthcare Providers: seriousbroker.it  This test is not yet approved or cleared by the [place]  FDA and has been authorized for detection and/or diagnosis of SARS-CoV-2 by FDA under an Emergency Use Authorization (EUA). This EUA will remain in effect (meaning this test can be used) for the duration of the COVID-19 declaration under Section 564(b)(1) of the Act, 21 U.S.C. section 360bbb-3(b)(1), unless the authorization is terminated or revoked.     Resp Syncytial Virus by PCR NEGATIVE NEGATIVE Final    Comment: (NOTE) Fact Sheet for  Patients: bloggercourse.com  Fact Sheet for Healthcare Providers: seriousbroker.it  This test is not yet approved or cleared by the [place]  FDA and has been authorized for detection and/or diagnosis of SARS-CoV-2 by FDA under an Emergency Use Authorization (EUA). This EUA will remain in effect (meaning this test can be used) for the duration of the COVID-19 declaration under Section 564(b)(1) of the Act, 21 U.S.C. section 360bbb-3(b)(1), unless the authorization is terminated or revoked.  Performed at [facility], [address], [place], [place] [address]   MRSA Next Gen by PCR, Nasal     Status: None   Collection Time: 06/09/24 12:05 PM   Specimen: Nasal Mucosa; Nasal Swab  Result Value Ref Range Status   MRSA by PCR Next Gen NOT DETECTED NOT DETECTED Final    Comment: (NOTE) The GeneXpert MRSA Assay (FDA approved for NASAL specimens only), is one component of a comprehensive MRSA colonization surveillance program. It is not intended to diagnose MRSA infection nor to guide or monitor treatment for MRSA infections. Test performance is not FDA approved in patients less than 25 years old. Performed at [facility], [address], [place], [place] [address]     Time coordinating discharge: 45 minutes  Signed: Trish Mancinelli  [facility] 06/10/2024, 12:01 PM

## 2024-06-10 NOTE — Progress Notes (Signed)
  [facility] Progress Note   Subjective:  Completed dialysis yesterday  CBGs better this am.  No complaints. Hoping to go home today.   Objective Vitals:   06/09/24 2313 06/09/24 2334 06/10/24 0447 06/10/24 0739  BP: (!) 183/80 (!) 164/83 (!) 155/79 (!) 160/86  Pulse: 77 78 75 72  Resp: 20  19 16   Temp: 98.7 F (37.1 C)  98.3 F (36.8 C) 98.5 F (36.9 C)  TempSrc: Oral  Oral Oral  SpO2: 94% 96% 91% 98%  Weight:      Height:         Additional Objective Labs: Basic Metabolic Panel: Recent Labs  Lab 06/08/24 2010 06/09/24 1350  NA 141 134*  K 4.5 5.0  CL 95* 90*  CO2 27 25  GLUCOSE 39* 252*  BUN 28* 34*  CREATININE 7.90* 9.11*  CALCIUM  9.9 9.3  PHOS  --  8.0*   CBC: Recent Labs  Lab 06/08/24 2010 06/09/24 1350  WBC 5.6 4.9  NEUTROABS 3.8  --   HGB 12.4 10.3*  HCT 38.4 31.6*  MCV 89.7 88.3  PLT 153 151   Blood Culture    Component Value Date/Time   SDES WOUND LEFT ARM 08/12/2022 1452   SPECREQUEST AV GRAFT 08/12/2022 1452   CULT  08/12/2022 1452    RARE STAPHYLOCOCCUS AUREUS NO ANAEROBES ISOLATED Performed at [facility], [address] [place], [place] [address]    REPTSTATUS 08/17/2022 FINAL 08/12/2022 1452     Physical Exam General: Alert, nad Heart: RRR Lungs: Clear Abdomen: non-tender Extremities: no LE edema  Dialysis Access: R AVG +bruit   Medications:   amLODipine   10 mg Oral QHS   Chlorhexidine  Gluconate Cloth  6 each Topical Q0600   heparin   5,000 Units Subcutaneous Q8H   insulin  pump   Subcutaneous Q4H   labetalol   300 mg Oral QHS   lanthanum   1,000 mg Oral TID with meals   rosuvastatin   10 mg Oral QHS    Dialysis Orders:  SW TTS  3.5h   B425    55.6kg   AVAG / HeRO  Heparin  none Last OP HD 11/18, post wt 57.7kg (+2kg)  Assessment/Plan: Hypoglycemia: recent adjustments to basal insulin . Management per primary  ESRD: on HD TTS. Continue per schedule  HTN: resume home meds as needed Volume: no  gross vol excess on exam, on RA, lungs clear. UF to dry wt as tolerated.  Anemia of esrd: Hgb 10.3 ESA per protocol  MBD. Phos elevated. Continue home binders.     Maisie Ronnald Acosta PA-C [facility] 06/10/2024,10:14 AM

## 2024-06-10 NOTE — Plan of Care (Signed)
 [facility] Patient Discharge Orders- [facility]: [facility]  Patient's name: [name] Admit/DC Dates: 06/08/2024 - 06/10/2024  Discharge Diagnoses: Hypoglycemia. Resolved.     Outpatient Dialysis Orders:  -Heparin : No change -EDW No change   -Bath: No change  Anemia Aranesp : Given: --   Date of last dose/amount: --   PRBC's Given: -- Date/# of units: -- ESA dose for discharge: per protocol   Recent Labs  Lab 06/09/24 1350  HGB 10.3*  K 5.0  CALCIUM  9.3  PHOS 8.0*  ALBUMIN  3.8   Access intervention/Change:  none    Medications: -IV Antibiotics: -  OTHER/APPTS/LABS   Completed by: Maisie Ronnald Acosta PA-C   D/C Meds to be reconciled by nurse after every discharge.    Reviewed by: MD:______ RN_______

## 2024-06-13 ENCOUNTER — Telehealth: Payer: Self-pay | Admitting: Physician Assistant

## 2024-06-13 NOTE — Telephone Encounter (Signed)
 Transition of care contact from inpatient facility  Date of Discharge: 06/10/24 Date of Contact: 06/13/24 Method of contact: Phone  Attempted to contact patient to discuss transition of care from inpatient admission. Call went straight to voicemail and unable to LM.  Lucie Collet, PA-C 06/13/2024, 10:06 AM  [facility] Pager: [phone]

## 2024-07-15 ENCOUNTER — Encounter (HOSPITAL_COMMUNITY): Payer: Self-pay

## 2024-07-18 ENCOUNTER — Ambulatory Visit (HOSPITAL_COMMUNITY)
Admission: RE | Admit: 2024-07-18 | Discharge: 2024-07-18 | Disposition: A | Discharge status: Home / Self Care | Attending: Vascular Surgery | Admitting: Vascular Surgery

## 2024-07-18 ENCOUNTER — Other Ambulatory Visit: Payer: Self-pay

## 2024-07-18 ENCOUNTER — Encounter (HOSPITAL_COMMUNITY): Payer: Self-pay | Admitting: Vascular Surgery

## 2024-07-18 ENCOUNTER — Encounter (HOSPITAL_COMMUNITY)
Admission: RE | Disposition: A | Discharge status: Home / Self Care | Payer: Self-pay | Source: Home / Self Care | Attending: Vascular Surgery

## 2024-07-18 DIAGNOSIS — Z794 Long term (current) use of insulin: Secondary | ICD-10-CM | POA: Diagnosis not present

## 2024-07-18 DIAGNOSIS — Z79899 Other long term (current) drug therapy: Secondary | ICD-10-CM | POA: Diagnosis not present

## 2024-07-18 DIAGNOSIS — E1022 Type 1 diabetes mellitus with diabetic chronic kidney disease: Secondary | ICD-10-CM | POA: Diagnosis not present

## 2024-07-18 DIAGNOSIS — I132 Hypertensive heart and chronic kidney disease with heart failure and with stage 5 chronic kidney disease, or end stage renal disease: Secondary | ICD-10-CM | POA: Insufficient documentation

## 2024-07-18 DIAGNOSIS — T82858A Stenosis of vascular prosthetic devices, implants and grafts, initial encounter: Secondary | ICD-10-CM | POA: Insufficient documentation

## 2024-07-18 DIAGNOSIS — N186 End stage renal disease: Secondary | ICD-10-CM | POA: Diagnosis not present

## 2024-07-18 DIAGNOSIS — Z992 Dependence on renal dialysis: Secondary | ICD-10-CM | POA: Insufficient documentation

## 2024-07-18 DIAGNOSIS — I509 Heart failure, unspecified: Secondary | ICD-10-CM | POA: Diagnosis not present

## 2024-07-18 DIAGNOSIS — Y832 Surgical operation with anastomosis, bypass or graft as the cause of abnormal reaction of the patient, or of later complication, without mention of misadventure at the time of the procedure: Secondary | ICD-10-CM | POA: Insufficient documentation

## 2024-07-18 HISTORY — PX: A/V FISTULAGRAM: CATH118298

## 2024-07-18 HISTORY — PX: VENOUS STENT: CATH118377

## 2024-07-18 SURGERY — A/V FISTULAGRAM
Anesthesia: LOCAL

## 2024-07-18 MED ORDER — LIDOCAINE HCL (PF) 1 % IJ SOLN
INTRAMUSCULAR | Status: AC
  Filled 2024-07-18: qty 30

## 2024-07-18 MED ORDER — IODIXANOL 320 MG/ML IV SOLN
INTRAVENOUS | Status: DC | PRN
  Administered 2024-07-18: 30 mL via INTRAVENOUS

## 2024-07-18 MED ORDER — LIDOCAINE HCL (PF) 1 % IJ SOLN
INTRAMUSCULAR | Status: DC | PRN
  Administered 2024-07-18: 2 mL via INTRADERMAL

## 2024-07-18 MED ORDER — HEPARIN (PORCINE) IN NACL 1000-0.9 UT/500ML-% IV SOLN
INTRAVENOUS | Status: DC | PRN
  Administered 2024-07-18 (×2): 500 mL

## 2024-07-18 SURGICAL SUPPLY — 12 items
BALLOON MUSTANG 7X100X75 (BALLOONS) IMPLANT
BALLOON MUSTANG 8.0X40 75 (BALLOONS) IMPLANT
DEVICE INFLATION ENCORE 26 (MISCELLANEOUS) IMPLANT
GUIDEWIRE ANGLED .035X150CM (WIRE) IMPLANT
KIT MICROPUNCTURE NIT STIFF (SHEATH) IMPLANT
KIT PV (KITS) ×2 IMPLANT
SHEATH PINNACLE R/O II 6F 4CM (SHEATH) IMPLANT
SHEATH PROBE COVER 6X72 (BAG) IMPLANT
STENT VIABAHN 8X50X75 (Permanent Stent) IMPLANT
TRAY PV CATH (CUSTOM PROCEDURE TRAY) ×2 IMPLANT
TUBING CIL FLEX 10 FLL-RA (TUBING) IMPLANT
WIRE TORQFLEX AUST .018X40CM (WIRE) IMPLANT

## 2024-07-18 NOTE — H&P (Signed)
 " H+P  History of Present Illness: This is a 48 y.o. female history of end-stage renal disease currently dialyzing via right upper extremity AV loop graft.  She has undergone previous intervention with balloon angioplasty of the outflow.  She is not having decreased flows on dialysis and is here today to discuss her options.  Past Medical History:  Diagnosis Date   Anemia    Arthritis    Ascites 03/28/2020   Bacteremia 02/11/2020   Cerebral infarction due to unspecified mechanism 05/22/2014   CHF (congestive heart failure) (HCC)    Diabetes mellitus    Diabetic retinopathy (HCC)    Hx of laser Rx's   DM type 1 (diabetes mellitus, type 1) (HCC) 09/12/2014   Age of onset for DM type 1 was age 67.      Enlarged thyroid  gland    ESRD on hemodialysis (HCC)    ESRD due to DM type I, age of onset DM 1 was age 31.  Went on dialysis in March 2012.  Gets HD now at [facility] on a TTS schedule.     ESRD on hemodialysis (HCC) 09/12/2014   ESRD due to DM type 1.  Started HD in 2012 at [facility].  Gets HD there on a TTS schedule.     GERD (gastroesophageal reflux disease)    HCAP (healthcare-associated pneumonia) 09/11/2014   Hemorrhage from arteriovenous dialysis graft 10/20/2019   History of blood transfusion    Hyperlipidemia    Hypertension    Hypertensive emergency 05/22/2014   Peripheral vascular disease    Pneumonia     Past Surgical History:  Procedure Laterality Date   A/V FISTULAGRAM Right 10/31/2020   Procedure: A/V FISTULAGRAM;  Surgeon: Magda Debby SAILOR, MD;  Location: [facility];  Service: Cardiovascular;  Laterality: Right;   A/V SHUNT INTERVENTION Right 02/19/2024   Procedure: A/V SHUNT INTERVENTION;  Surgeon: Gretta Lonni PARAS, MD;  Location: [facility];  Service: Cardiovascular;  Laterality: Right;   ARTERIOVENOUS GRAFT PLACEMENT     ARTERY REPAIR Left 12/10/2012   Procedure: BRACHIAL ARTERY REPAIR;  Surgeon: Lonni GORMAN Blade, MD;  Location: [facility] OR;   Service: Vascular;  Laterality: Left;  Exploration Left Brachial Artery for AVF   AV FISTULA PLACEMENT Right 01/11/2018   Procedure: INSERTION OF ARTERIOVENOUS (AV) GORE-TEX GRAFT  UPPER ARM;  Surgeon: Blade Lonni GORMAN, MD;  Location: [facility] OR;  Service: Vascular;  Laterality: Right;   AVGG REMOVAL Left 08/12/2022   Procedure: LEFT ARM ARTERIOVENOUS GRAFT EXCISION WITH VEIN PATCH ANGIOPLASTY;  Surgeon: Sheree Penne Lonni, MD;  Location: [facility] OR;  Service: Vascular;  Laterality: Left;   BASCILIC VEIN TRANSPOSITION Right 09/28/2017   Procedure: FIRST STAGE BASILIC VEIN TRANSPOSITION;  Surgeon: Blade Lonni GORMAN, MD;  Location: [facility] OR;  Service: Vascular;  Laterality: Right;   CESAREAN SECTION     CHEST TUBE INSERTION Right 06/01/2020   Procedure: CHEST TUBE INSERTION;  Surgeon: Brenna Adine CROME, DO;  Location: [facility]OS[facility];  Service: Pulmonary;  Laterality: Right;   CHEST TUBE INSERTION Right 07/04/2020   Procedure: INSERTION PLEURAL DRAINAGE CATHETER;  Surgeon: Kerrin Elspeth BROCKS, MD;  Location: [facility] OR;  Service: Thoracic;  Laterality: Right;   EYE SURGERY     LAzer   EYE SURGERY Left    IR THORACENTESIS ASP PLEURAL SPACE W/IMG GUIDE  05/31/2020   IR THORACENTESIS ASP PLEURAL SPACE W/IMG GUIDE  06/22/2020   REVISION OF ARTERIOVENOUS GORETEX GRAFT Left 08/01/2016   Procedure: REVISION OF  ARTERIOVENOUS GORETEX GRAFT;  Surgeon: Lonni GORMAN Blade, MD;  Location: [facility] OR;  Service: Vascular;  Laterality: Left;   REVISION OF ARTERIOVENOUS GORETEX GRAFT Right 10/21/2019   Procedure: REVISION OF ARTERIOVENOUS GORETEX GRAFT ARM;  Surgeon: Oris Krystal FALCON, MD;  Location: [facility] OR;  Service: Vascular;  Laterality: Right;   RIGHT HEART CATH N/A 08/01/2020   Procedure: RIGHT HEART CATH;  Surgeon: Cherrie Toribio SAUNDERS, MD;  Location: [facility];  Service: Cardiovascular;  Laterality: N/A;   SHUNTOGRAM Left 11/30/2013   Procedure: RICH;  Surgeon: Gaile LELON New, MD;  Location: [facility] CATH LAB;   Service: Cardiovascular;  Laterality: Left;   TEE WITHOUT CARDIOVERSION N/A 02/13/2020   Procedure: TRANSESOPHAGEAL ECHOCARDIOGRAM (TEE);  Surgeon: Delford Maude BROCKS, MD;  Location: [facility] ENDOSCOPY;  Service: Cardiovascular;  Laterality: N/A;   TEE WITHOUT CARDIOVERSION N/A 08/15/2022   Procedure: TRANSESOPHAGEAL ECHOCARDIOGRAM (TEE);  Surgeon: Alvan Ronal BRAVO, MD;  Location: [facility] ENDOSCOPY;  Service: Cardiovascular;  Laterality: N/A;   VENOUS ANGIOPLASTY  02/19/2024   Procedure: VENOUS ANGIOPLASTY;  Surgeon: Gretta Lonni PARAS, MD;  Location: [facility];  Service: Cardiovascular;;  Basilic Intragraft/Venous Anastomosis    Allergies[1]  Prior to Admission medications  Medication Sig Start Date End Date Taking? Authorizing Provider  amLODipine  (NORVASC ) 10 MG tablet Take 10 mg by mouth at bedtime.    [provider]  Insulin  Human (INSULIN  PUMP) SOLN Dose to be adjusted in consultation with primary endocrinologist 06/10/24   Arlice Reichert, MD  labetalol  (NORMODYNE ) 300 MG tablet Take 300 mg by mouth at bedtime.    [provider]  Lanthanum  Carbonate (FOSRENOL ) 1000 MG PACK Take 1,000 mg by mouth with breakfast, with lunch, and with evening meal.    [provider]  loratadine  (CLARITIN ) 10 MG tablet Take 10 mg by mouth daily as needed for allergies. Patient not taking: Reported on 06/08/2024    [provider]  NOVOLOG  100 UNIT/ML injection Inject 1-5 Units into the skin with breakfast, with lunch, and with evening meal. Per sliding scale 10/10/20   [provider]  Pediatric Multivit-Minerals-C (FLINTSTONES GUMMIES PO) Take 2 tablets by mouth in the morning.    [provider]  rosuvastatin  (CRESTOR ) 10 MG tablet Take 10 mg by mouth at bedtime.    [provider]    Social History   Socioeconomic History   Marital status: Single    Spouse name: Not on file   Number of children: 1   Years of education: [employer]   Highest education  level: Not on file  Occupational History   Not on file  Tobacco Use   Smoking status: Never   Smokeless tobacco: Never  Vaping Use   Vaping status: Never Used  Substance and Sexual Activity   Alcohol  use: No    Alcohol /week: 0.0 standard drinks of alcohol    Drug use: No   Sexual activity: Not Currently  Other Topics Concern   Not on file  Social History Narrative   ** Merged History Encounter **       Patient is single with 1 child. Patient is right handed. Patient has BS degree. Patient drinks 0 caffeine.   Social Drivers of Health   Tobacco Use: Low Risk (06/08/2024)   Patient History    Smoking Tobacco Use: Never    Smokeless Tobacco Use: Never    Passive Exposure: Not on file  Financial Resource Strain: Not on file  Food Insecurity: No Food Insecurity (06/09/2024)   Epic  Worried About Programme Researcher, Broadcasting/film/video in the Last Year: Never true    Ran Out of Food in the Last Year: Never true  Transportation Needs: No Transportation Needs (06/09/2024)   Epic    Lack of Transportation (Medical): No    Lack of Transportation (Non-Medical): No  Physical Activity: Not on file  Stress: Not on file  Social Connections: Not on file  Intimate Partner Violence: Not At Risk (06/09/2024)   Epic    Fear of Current or Ex-Partner: No    Emotionally Abused: No    Physically Abused: No    Sexually Abused: No  Depression (PHQ2-9): Not on file  Alcohol  Screen: Not on file  Housing: Low Risk (06/09/2024)   Epic    Unable to Pay for Housing in the Last Year: No    Number of Times Moved in the Last Year: 0    Homeless in the Last Year: No  Utilities: Not At Risk (06/09/2024)   Epic    Threatened with loss of utilities: No  Health Literacy: Not on file     Family History  Problem Relation Age of Onset   Cancer Mother        breast   Hypertension Father     ROS:  Cardiovascular: []  chest pain/pressure []  palpitations []  SOB lying flat []  DOE []  pain in legs while  walking []  pain in legs at rest []  pain in legs at night []  non-healing ulcers []  hx of DVT []  swelling in legs  Pulmonary: []  productive cough []  asthma/wheezing []  home O2  Neurologic: []  weakness in []  arms []  legs []  numbness in []  arms []  legs []  hx of CVA []  mini stroke [] difficulty speaking or slurred speech []  temporary loss of vision in one eye []  dizziness  Hematologic: []  hx of cancer []  bleeding problems []  problems with blood clotting easily  Endocrine:   []  diabetes []  thyroid  disease  GI []  vomiting blood []  blood in stool  GU: []  CKD/renal failure []  HD--[]  M/W/F or []  T/T/S []  burning with urination [x]  decreased flow on dialysis right arm AV graft  Psychiatric: []  anxiety []  depression  Musculoskeletal: []  arthritis []  joint pain  Integumentary: []  rashes []  ulcers  Constitutional: []  fever []  chills   Physical Examination  Vitals:   07/18/24 0906  BP: (!) 202/174  Pulse: 78  Resp: 12  Temp: 97.8 F (36.6 C)  SpO2: 91%   Awake alert and oriented Nonlabored respirations Right upper arm AV graft with pulsatility Left arm incisions well-healed  CBC    Component Value Date/Time   WBC 4.9 06/09/2024 1350   RBC 3.58 (L) 06/09/2024 1350   HGB 10.3 (L) 06/09/2024 1350   HCT 31.6 (L) 06/09/2024 1350   PLT 151 06/09/2024 1350   MCV 88.3 06/09/2024 1350   MCH 28.8 06/09/2024 1350   MCHC 32.6 06/09/2024 1350   RDW 15.3 06/09/2024 1350   LYMPHSABS 1.1 06/08/2024 2010   MONOABS 0.4 06/08/2024 2010   EOSABS 0.2 06/08/2024 2010   BASOSABS 0.1 06/08/2024 2010    BMET    Component Value Date/Time   NA 134 (L) 06/09/2024 1350   K 5.0 06/09/2024 1350   CL 90 (L) 06/09/2024 1350   CO2 25 06/09/2024 1350   GLUCOSE 252 (H) 06/09/2024 1350   BUN 34 (H) 06/09/2024 1350   CREATININE 9.11 (H) 06/09/2024 1350   CALCIUM  9.3 06/09/2024 1350   CALCIUM  10.2 12/03/2009 1101   GFRNONAA 5 (  L) 06/09/2024 1350   GFRAA 9 (L) 02/14/2020  0116    COAGS: Lab Results  Component Value Date   INR 1.1 10/21/2019   INR 1.15 04/16/2018   INR 1.04 03/29/2014     ASSESSMENT/PLAN: This is a 48 y.o. female with end-stage renal disease on dialysis via heavily calcified right upper arm AV graft.  She is having low flows on dialysis.  We have discussed her options she has elected for shuntogram with possible invention of the right upper extremity graft.  We discussed risk benefits alternatives and she demonstrates good understanding and consent was signed.  Alanzo Lamb C. Sheree, MD [facility] Office: [phone] Pager: [phone]     [1]  Allergies Allergen Reactions   Pollen Extract Other (See Comments)    Watery eyes   "

## 2024-07-18 NOTE — Op Note (Signed)
" ° ° °  Patient name: [name] MRN: [id number] DOB: [birth year] Sex: female  07/18/2024 Pre-operative Diagnosis: End-stage renal disease, malfunctioning right arm AV graft Post-operative diagnosis:  Same Surgeon:  Penne BROCKS. Sheree, MD Procedure Performed: 1.  Percutaneous ultrasound-guided cannulation right arm AV graft 2.  Right upper extremity shuntogram 3.  Stent right upper arm AV graft outflow stenosis with 8 x 50 mm Viabahn  Indications: 48 year old female with history of end-stage renal disease dialyzing via right upper arm AV loop graft.  She has decreased flows on dialysis and we have discussed her options including fistulogram and she has agreed to proceed.  She has previously undergone balloon angioplasty of the outflow with increased flow at the time of procedure approximately 5 months ago.  Findings: There was recurrent outflow stenosis now approximately 80% this was stented to less than 10% residual stenosis with much improved thrill upon completion.  The entirety of the graft is heavily calcified but remains amenable to future percutaneous intervention.   Procedure:  The patient was identified in the holding area and taken to the heart and vascular procedure room.  The patient was then placed supine on the table and prepped and draped in the usual sterile fashion.  A time out was called.  Ultrasound was used to evaluate the right arm AV graft.  It was noted to be heavily calcified we did find 1 area which was lightly calcified and the area around this was anesthetized 1% lidocaine  and cannulated micropuncture needle followed by wire and sheath.  On ultrasound images saved the permanent record.  We performed upper extremity shuntogram with the above findings we elected for intervention.  A Glidewire was placed followed by a 6 French sheath and we initially performed balloon angioplasty of the outflow stenosis with 7 mm balloon and with the balloon inflated we performed retrograde  imaging.  At completion we then identified residual stenosis which was then primarily stented at the venous outflow and postdilated with an 8 x 40 mm balloon to less than 10% residual stenosis with much improved thrill.  Satisfied with this the wire was removed the sheath was then pulled and the cannulation site was suture-ligated.  She tolerated the procedure without immediate complication.  Contrast: 30cc   Qadir Folks C. Sheree, MD [facility] [place] Office: [phone] Pager: [phone]   "
